# Patient Record
Sex: Female | Born: 1935 | Race: Black or African American | Hispanic: No | Smoking: Never smoker
Health system: Southern US, Community
[De-identification: ages and names within clinical notes are randomized; demographics above are authoritative.]

## PROBLEM LIST (undated history)

## (undated) DIAGNOSIS — M542 Cervicalgia: Secondary | ICD-10-CM

## (undated) DIAGNOSIS — G8252 Quadriplegia, C1-C4 incomplete: Secondary | ICD-10-CM

## (undated) DIAGNOSIS — M109 Gout, unspecified: Secondary | ICD-10-CM

## (undated) DIAGNOSIS — D649 Anemia, unspecified: Secondary | ICD-10-CM

## (undated) DIAGNOSIS — N3000 Acute cystitis without hematuria: Secondary | ICD-10-CM

## (undated) DIAGNOSIS — F329 Major depressive disorder, single episode, unspecified: Secondary | ICD-10-CM

## (undated) DIAGNOSIS — N184 Chronic kidney disease, stage 4 (severe): Secondary | ICD-10-CM

## (undated) DIAGNOSIS — F32A Depression, unspecified: Secondary | ICD-10-CM

## (undated) DIAGNOSIS — F419 Anxiety disorder, unspecified: Secondary | ICD-10-CM

## (undated) DIAGNOSIS — E1142 Type 2 diabetes mellitus with diabetic polyneuropathy: Secondary | ICD-10-CM

## (undated) DIAGNOSIS — R339 Retention of urine, unspecified: Secondary | ICD-10-CM

## (undated) DIAGNOSIS — I10 Essential (primary) hypertension: Secondary | ICD-10-CM

## (undated) DIAGNOSIS — G2581 Restless legs syndrome: Secondary | ICD-10-CM

## (undated) DIAGNOSIS — K219 Gastro-esophageal reflux disease without esophagitis: Secondary | ICD-10-CM

## (undated) DIAGNOSIS — G629 Polyneuropathy, unspecified: Secondary | ICD-10-CM

## (undated) DIAGNOSIS — E119 Type 2 diabetes mellitus without complications: Secondary | ICD-10-CM

## (undated) DIAGNOSIS — S14124D Central cord syndrome at C4 level of cervical spinal cord, subsequent encounter: Secondary | ICD-10-CM

## (undated) DIAGNOSIS — R569 Unspecified convulsions: Secondary | ICD-10-CM

## (undated) DIAGNOSIS — N189 Chronic kidney disease, unspecified: Secondary | ICD-10-CM

## (undated) DIAGNOSIS — R627 Adult failure to thrive: Secondary | ICD-10-CM

## (undated) DIAGNOSIS — I700 Atherosclerosis of aorta: Secondary | ICD-10-CM

## (undated) DIAGNOSIS — R1310 Dysphagia, unspecified: Secondary | ICD-10-CM

## (undated) DIAGNOSIS — M5410 Radiculopathy, site unspecified: Secondary | ICD-10-CM

## (undated) DIAGNOSIS — E7800 Pure hypercholesterolemia, unspecified: Secondary | ICD-10-CM

## (undated) DIAGNOSIS — E871 Hypo-osmolality and hyponatremia: Secondary | ICD-10-CM

## (undated) HISTORY — DX: Anemia, unspecified: D64.9

## (undated) HISTORY — DX: Adult failure to thrive: R62.7

## (undated) HISTORY — PX: BACK SURGERY: SHX140

## (undated) HISTORY — DX: Pure hypercholesterolemia, unspecified: E78.00

## (undated) HISTORY — DX: Gastro-esophageal reflux disease without esophagitis: K21.9

## (undated) HISTORY — PX: KNEE SURGERY: SHX244

## (undated) HISTORY — DX: Hypo-osmolality and hyponatremia: E87.1

## (undated) HISTORY — DX: Essential (primary) hypertension: I10

## (undated) HISTORY — DX: Retention of urine, unspecified: R33.9

## (undated) HISTORY — DX: Chronic kidney disease, stage 4 (severe): N18.4

## (undated) HISTORY — DX: Quadriplegia, C1-C4 incomplete: G82.52

## (undated) HISTORY — PX: CERVICAL DISC SURGERY: SHX588

## (undated) HISTORY — DX: Type 2 diabetes mellitus with diabetic polyneuropathy: E11.42

## (undated) HISTORY — PX: APPENDECTOMY: SHX54

## (undated) HISTORY — DX: Chronic kidney disease, unspecified: N18.9

## (undated) HISTORY — PX: ABDOMINAL HYSTERECTOMY: SHX81

## (undated) HISTORY — DX: Acute cystitis without hematuria: N30.00

## (undated) HISTORY — DX: Unspecified convulsions: R56.9

## (undated) HISTORY — DX: Depression, unspecified: F32.A

## (undated) HISTORY — DX: Type 2 diabetes mellitus without complications: E11.9

## (undated) HISTORY — PX: HAND SURGERY: SHX662

---

## 1999-03-03 ENCOUNTER — Encounter: Payer: Self-pay | Admitting: Neurosurgery

## 1999-03-03 ENCOUNTER — Ambulatory Visit (HOSPITAL_COMMUNITY): Admission: RE | Admit: 1999-03-03 | Discharge: 1999-03-03 | Payer: Self-pay | Admitting: Neurosurgery

## 1999-06-28 ENCOUNTER — Ambulatory Visit (HOSPITAL_COMMUNITY): Admission: RE | Admit: 1999-06-28 | Discharge: 1999-06-28 | Payer: Self-pay | Admitting: Neurosurgery

## 1999-06-28 ENCOUNTER — Encounter: Payer: Self-pay | Admitting: Neurosurgery

## 1999-07-10 ENCOUNTER — Encounter: Payer: Self-pay | Admitting: Neurosurgery

## 1999-07-10 ENCOUNTER — Ambulatory Visit (HOSPITAL_COMMUNITY): Admission: RE | Admit: 1999-07-10 | Discharge: 1999-07-10 | Payer: Self-pay | Admitting: Neurosurgery

## 1999-10-17 ENCOUNTER — Ambulatory Visit (HOSPITAL_COMMUNITY): Admission: RE | Admit: 1999-10-17 | Discharge: 1999-10-17 | Payer: Self-pay | Admitting: Neurosurgery

## 1999-10-17 ENCOUNTER — Encounter: Payer: Self-pay | Admitting: Neurosurgery

## 1999-11-07 ENCOUNTER — Ambulatory Visit (HOSPITAL_COMMUNITY): Admission: RE | Admit: 1999-11-07 | Discharge: 1999-11-07 | Payer: Self-pay | Admitting: Neurosurgery

## 1999-11-07 ENCOUNTER — Encounter: Payer: Self-pay | Admitting: Neurosurgery

## 2000-05-04 ENCOUNTER — Encounter: Payer: Self-pay | Admitting: Neurosurgery

## 2000-05-04 ENCOUNTER — Ambulatory Visit (HOSPITAL_COMMUNITY): Admission: RE | Admit: 2000-05-04 | Discharge: 2000-05-04 | Payer: Self-pay | Admitting: Neurosurgery

## 2000-05-24 ENCOUNTER — Ambulatory Visit (HOSPITAL_COMMUNITY): Admission: RE | Admit: 2000-05-24 | Discharge: 2000-05-24 | Payer: Self-pay | Admitting: Neurosurgery

## 2000-05-24 ENCOUNTER — Encounter: Payer: Self-pay | Admitting: Neurosurgery

## 2000-06-09 ENCOUNTER — Ambulatory Visit (HOSPITAL_COMMUNITY): Admission: RE | Admit: 2000-06-09 | Discharge: 2000-06-09 | Payer: Self-pay | Admitting: Neurosurgery

## 2000-06-09 ENCOUNTER — Encounter: Payer: Self-pay | Admitting: Neurosurgery

## 2000-06-21 ENCOUNTER — Emergency Department (HOSPITAL_COMMUNITY): Admission: EM | Admit: 2000-06-21 | Discharge: 2000-06-22 | Payer: Self-pay | Admitting: Emergency Medicine

## 2000-11-08 ENCOUNTER — Encounter: Payer: Self-pay | Admitting: Neurosurgery

## 2000-11-08 ENCOUNTER — Ambulatory Visit (HOSPITAL_COMMUNITY): Admission: RE | Admit: 2000-11-08 | Discharge: 2000-11-08 | Payer: Self-pay | Admitting: Neurosurgery

## 2000-11-22 ENCOUNTER — Encounter: Payer: Self-pay | Admitting: Neurosurgery

## 2000-11-24 ENCOUNTER — Inpatient Hospital Stay (HOSPITAL_COMMUNITY): Admission: RE | Admit: 2000-11-24 | Discharge: 2000-11-25 | Payer: Self-pay | Admitting: Neurosurgery

## 2000-11-24 ENCOUNTER — Encounter: Payer: Self-pay | Admitting: Neurosurgery

## 2000-12-13 ENCOUNTER — Encounter: Payer: Self-pay | Admitting: Neurosurgery

## 2000-12-13 ENCOUNTER — Encounter: Admission: RE | Admit: 2000-12-13 | Discharge: 2000-12-13 | Payer: Self-pay | Admitting: Neurosurgery

## 2001-01-18 ENCOUNTER — Encounter: Admission: RE | Admit: 2001-01-18 | Discharge: 2001-01-18 | Payer: Self-pay | Admitting: Neurosurgery

## 2001-01-18 ENCOUNTER — Encounter: Payer: Self-pay | Admitting: Neurosurgery

## 2001-07-05 ENCOUNTER — Encounter: Payer: Self-pay | Admitting: Emergency Medicine

## 2001-07-05 ENCOUNTER — Emergency Department (HOSPITAL_COMMUNITY): Admission: EM | Admit: 2001-07-05 | Discharge: 2001-07-05 | Payer: Self-pay | Admitting: Emergency Medicine

## 2004-02-13 ENCOUNTER — Ambulatory Visit (HOSPITAL_COMMUNITY): Admission: RE | Admit: 2004-02-13 | Discharge: 2004-02-13 | Payer: Self-pay | Admitting: Neurosurgery

## 2004-03-11 ENCOUNTER — Encounter: Admission: RE | Admit: 2004-03-11 | Discharge: 2004-03-11 | Payer: Self-pay | Admitting: Neurosurgery

## 2004-04-04 ENCOUNTER — Emergency Department (HOSPITAL_COMMUNITY): Admission: EM | Admit: 2004-04-04 | Discharge: 2004-04-04 | Payer: Self-pay | Admitting: Emergency Medicine

## 2004-04-20 ENCOUNTER — Ambulatory Visit (HOSPITAL_COMMUNITY): Admission: RE | Admit: 2004-04-20 | Discharge: 2004-04-20 | Payer: Self-pay | Admitting: Neurosurgery

## 2004-04-23 ENCOUNTER — Inpatient Hospital Stay (HOSPITAL_COMMUNITY): Admission: RE | Admit: 2004-04-23 | Discharge: 2004-04-26 | Payer: Self-pay | Admitting: Neurosurgery

## 2007-05-22 ENCOUNTER — Emergency Department (HOSPITAL_COMMUNITY): Admission: EM | Admit: 2007-05-22 | Discharge: 2007-05-22 | Payer: Self-pay | Admitting: Emergency Medicine

## 2007-05-27 ENCOUNTER — Ambulatory Visit (HOSPITAL_COMMUNITY): Admission: RE | Admit: 2007-05-27 | Discharge: 2007-05-27 | Payer: Self-pay | Admitting: Internal Medicine

## 2008-01-10 ENCOUNTER — Encounter (INDEPENDENT_AMBULATORY_CARE_PROVIDER_SITE_OTHER): Payer: Self-pay | Admitting: Emergency Medicine

## 2008-01-10 ENCOUNTER — Inpatient Hospital Stay (HOSPITAL_COMMUNITY): Admission: EM | Admit: 2008-01-10 | Discharge: 2008-01-15 | Payer: Self-pay | Admitting: Emergency Medicine

## 2008-01-10 ENCOUNTER — Ambulatory Visit: Payer: Self-pay | Admitting: Vascular Surgery

## 2010-09-21 ENCOUNTER — Encounter: Payer: Self-pay | Admitting: Neurosurgery

## 2010-09-22 ENCOUNTER — Encounter: Payer: Self-pay | Admitting: Internal Medicine

## 2011-01-13 NOTE — H&P (Signed)
[name], [name]               ACCOUNT NO.:  [id number]   MEDICAL RECORD NO.:  [id number]          PATIENT TYPE:  EMS   LOCATION:  MAJO                         FACILITY:  [facility]   PHYSICIAN:  Aquilla Hacker, M.D. DATE OF BIRTH:  Dec 20, 1935   DATE OF ADMISSION:  01/09/2008  DATE OF DISCHARGE:                              HISTORY & PHYSICAL   PRIMARY CARE PHYSICIAN:  Unassigned.   NEUROSURGEON:  Dr. Meryle Ready.   CHIEF COMPLAINT:  Back and leg pain.   HISTORY OF PRESENT ILLNESS:  [name] 75 year old female with a  past medical history of diabetes mellitus and chronic low back pain.  She indicates that she has had back pain off and on for quite some time,  but had relatively little back pain up until Sunday which is 3 days ago.  During that time she had an episode whereby her left leg began to hurt  as well as the left lower region of her back.  She states that it felt  like the back pain was traveling down to her left leg.  She became  nauseated and felt as if she is going to pass out.  She is very vague  with regards to how long this episode lasted, but indicates that she had  a second episode today.  She was sitting up completing her check when  she began to feel what she refers to as a swimming type of sensation in  her head.  She became lightheaded and she began to stagger as if she was  drunk, and then she again had an episode of back pain.  This time the  pain traveled down both of her legs, but more so on the left-side than  the right.  She indicates that last week she fell in the bath tub on  Tuesday or Wednesday.  She had no immediate.  Today her sugars were  elevated and she experienced at least three episodes of vomiting today.  When asked whether or not she had a seizure, she stated that she may  have had a seizure episode today.   PAST MEDICAL HISTORY:  1. Diabetes mellitus.  2. Low blood pressure.  3. Chronic back pain.  4. Chronic low leg pain since  July 2008.  5. History of seizure in the past.   PAST SURGICAL HISTORY:  Back surgery x2.   ALLERGIES:  CODEINE.   HOME MEDICATIONS:  1. Metformin 500 mg b.i.d.  2. Poor compliance with other home medications.   SOCIAL HISTORY:  Cigarettes:  The patient denies.  Alcohol:  The patient  denies.   FAMILY HISTORY:  Mother had a history of colon cancer.  Father had heart  problems and gout.   REVIEW OF SYSTEMS:  As per HPI.   PHYSICAL EXAMINATION:  GENERAL:  The patient is wide awake.  She looks  uncomfortable and she prefers to lay on the left side of her  back/axillary region, indicating that this is more comfortable for her.  VITAL SIGNS:  Her temperature was 101.4, blood pressure 104/58, heart  rate 110, respirations 12, oxygen saturation  is 99%. HEENT:  Normocephalic, atraumatic.  Anicteric extraocular muscles intact.  Oral  mucosa is pink.  No thrush, no exudates.  NECK:  Supple.  No lymphadenopathy, no thyromegaly.  CARDIAC:  S1 and S2  present.  Heart sounds are distant.  RESPIRATORY:  No crackles or  wheezes.  ABDOMEN:  Soft, nontender, nondistended.  Positive bowel sounds.  No  masses.  EXTREMITIES:  No leg edema.  NEUROLOGIC:  The patient is alert and oriented to person, place.  She  knows the month is May.  She thought the year was 2008.  MUSCULOSKELETAL:  5/5 upper extremity strength.  Right leg strength is  5/5.  Left leg had a significantly decreased range of motion secondary  to back pain that occurred as soon as the patient attempted to raise her  leg.   LABORATORY DATA:  Urinalysis is negative.  Sodium 136, potassium 3.6,  chloride 103, CO2 27, glucose 300, BUN 11, creatinine 0.89, calcium  8.5.  WBCs of 12.6, hemoglobin 11.1, hematocrit 32.4, platelet 184,000.  X-ray of the lumbar spine: Osteopenia, mild spondylosis.  No acute  findings.  Chest x-ray reveals no acute cardiopulmonary abnormalities.   ASSESSMENT/PLAN:  1. Acute on chronic low back pain.  The  precipitating factor for the      patient's current low back pain is questionable.  An MRI of the      patient's back has been ordered.  Will follow the results of that.      Will also provide the patient with p.r.n. pain medication.  Will      consider consultation with the patient's neurosurgeon.  2. Bilateral leg pain with left being greater than the right.  There      may be some sort of a radiculopathy associated with the patient's      lower back pain.  Again will get the MRI of the patient's back and      consider further work-up pending the results of the patient's back.      We will also order venous Dopplers of both of the patient's lower      extremities.  3. Diabetes mellitus.  Per patient report, her sugars have been      elevated.  Will check a hemoglobin A1C and start Accu-Cheks as well      as sliding scale insulin.  Will adjust the patient's medications as      needed to optimally control her diabetes in the course of this      hospitalization.  4. For GI prophylaxis, provide Protonix.  5. For DVT prophylaxis, provide Lovenox      Aquilla Hacker, M.D.  Electronically Signed     OR/MEDQ  D:  01/10/2008  T:  01/10/2008  Job:  [id number]

## 2011-01-13 NOTE — Consult Note (Signed)
NAMESHIYAN, EMPEY               ACCOUNT NO.:  [id number]   MEDICAL RECORD NO.:  [id number]          PATIENT TYPE:  INP   LOCATION:  6733                         FACILITY:  [facility]   PHYSICIAN:  Ashok Pall, M.D.     DATE OF BIRTH:  26-Aug-1936   DATE OF CONSULTATION:  DATE OF DISCHARGE:                                 CONSULTATION   REASON FOR CALL:  Findings on MRI scan secondary to the patient being  admitted for fever.   INDICATIONS:  [name] is a 34-year woman very well known to me, as  I am taking care of her now for a number of years.  She was admitted  recently secondary to fever, chills, nausea, and edema in her right  lower extremity.  She has been having some left lower extremity pain and  back pain for some time, but this did not get much worse when she was  admitted.  She was admitted to the hospital on Jan 09, 2008.  She began  to stagger, like someone who was drunk, and her daughter felt that she  should come to the hospital.  She fell on the bathtub on Tuesday and  Wednesday of last week, had no immediate pain at that time.  She had  three episodes of emesis on the day of admission and had bilateral low  back pain.   [name] underwent her lumbar procedure that I performed in 2005 in  the lower back.  I also did an anterior cervical decompression and  arthrodesis at C4-C5 and C5-C6 in 2002.  After a lumbar procedure for  left-sided disk herniation at L5-S1, [name] improved.  Obviously,  I have not seen her back in the office for about half a year to a year.  She had been doing well enough that she has not called in for pain  medications at least to my office, nor any other problems.  She has been  having this low back pain now for some time.  She had another scan,  which was done in October 2008.  That scan was read as showing  postoperative changes.  There was no residual disk protrusion at that  time, and I am called today because it was felt by  the medical doctor  from [facility] to see Dr. Ivette Loyal that she might have a recurrent disk,  which was causing her pain.   PAST MEDICAL HISTORY:  Significant for chronic back pain, leg pain since  July 2008.  Diabetes mellitus, low blood pressure, history of seizure in  the past.  She has had two lumbar procedures.  She has had one cervical  procedure.   ALLERGIES:  To CODEINE.   Medications upon admission to the hospital were metformin 500 mg p.o.  b.i.d.   CURRENT MEDICATIONS:  1. Protonix.  2. Lovenox.  3. Senokot.  4. NovoLog.  5. Insulin on a sliding scale.  6. Morphine for pain.  7. Zofran p.r.n. suppository.   She says she has been constipated also.  She has also been noted to be  febrile, she is  febrile to 101.4 on admission.   FAMILY HISTORY:  Mother had colon cancer.  Father had heart disease and  gout.   VITAL SIGNS:  Today, T-max of 98.1, pulse 71, respiratory rate 20, blood  pressure 134/79.  Blood sugars have been 130s to 160s.  On examination, she is alert, oriented x4, and answering all questions  appropriately.  She is lying on the bed and appears comfortable.  She has some minimal weakness in the left extensor hallucis longus and  left dorsiflexor to about 5-/5, otherwise 5/5 strength in the lower  extremities.  She has erythema and edema present in the right leg, which  is tender to touch.  Normal muscle tone, bulk, and coordination.  Sensation to light touch is  intact, proprioception is intact.  She has 5/5 strength in the upper  extremities.  Normal coordination.  Speech is clear and fluent.  Memory  length, assessment, and fund of knowledge are normal.  She has symmetric  facial sensation.  Hearing intact to voice bilaterally.  Tongue and  uvula in the midline.  Shoulder shrug is normal.   MRI was reviewed.  She does not have a recurrent disk.  There is  absolutely no change in the scan from the one that was done in  October  2008.  Certainly, no  indication whatsoever for operative intervention.  She has granulation tissue surrounding the nerve root.  Thecal sac has  been displaced since she had the disk herniation, but she has absolutely  no evidence of the disk herniation now.  She certainly has no acute  entity, which I can identify in the lumbar spine, which would be causing  the pain and discomfort that she has.  But that certainly is not causing  her to be febrile, or either causing her to be nauseated or cause her to  have emesis.  I do not believe that she needs any acute intervention.  For the leg pain, we can certainly arrange for her to undergo epidural  steroid injections after she is discharged from the hospital, and full  medical workup has been completed.  But without any evidence of  diskitis, osteomyelitis, recurrent disk herniation of the leg,  neurosurgical involvement is limited during this hospitalization.           ______________________________  Ashok Pall, M.D.     KC/MEDQ  D:  01/11/2008  T:  01/12/2008  Job:  [id number]

## 2011-01-13 NOTE — Discharge Summary (Signed)
[name], [name]               ACCOUNT NO.:  [id number]   MEDICAL RECORD NO.:  [id number]          PATIENT TYPE:  INP   LOCATION:  6733                         FACILITY:  [facility]   PHYSICIAN:  Sherryl Manges, M.D.  DATE OF BIRTH:  [birth year]   DATE OF ADMISSION:  01/09/2008  DATE OF DISCHARGE:  01/15/2008                               DISCHARGE SUMMARY   PRIMARY MEDICAL DOCTOR:  Dr. Elba Barman, [facility]; telephone  number [phone].  The patient is unassigned to [place].   NEUROSURGEON:  Dr. Ashok Pall.   DISCHARGE DIAGNOSES:  1. 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  2. Type 2 diabetes mellitus.  3. Right lower extremity cellulitis.  4. Tinea interdigitalis right foot.  5. History of seizure disorder.   DISCHARGE MEDICATIONS:  1. Flexeril 10 mg p.o. t.i.d.  2. Motrin 600 mg p.o. t.i.d. with food.  3. Vicodin (5/500) one p.o. p.r.n. q.6h.  4. Lotrimin 1% AF apply topically between toes right foot twice daily      for 2 weeks.  5. Metformin 500 mg p.o. b.i.d.  6. Augmentin 500 mg p.o. t.i.d. for week only.   PROCEDURES:  1. Chest x-ray dated Jan 09, 2008.  This showed no acute      cardiopulmonary abnormality.  2. X-ray lumbar spine dated Jan 09, 2008.  This showed osteopenia,      mild spondylosis, no acute findings, degenerative disk disease      noted at L5-S1.  3. MRI lumbar spine dated Jan 10, 2008.  This showed no evidence of      acute infection in the lumbar spine. stable to mildly progressed      advanced disk degeneration at L5-S1 with postoperative changes at      this level.  Stable assessment of left S1 nerve root and left L5      neural foraminal stenosis.  Slightly decreased annular tier at L4-      5.  Stable advanced multilevel facet degeneration.  Stable 10-mm      posterior cystic structures probably related to the left L5-S1      facets.   CONSULTATIONS:  Dr. Ashok Pall, neurosurgeon.   ADMISSION HISTORY:  As in  H&P notes of Jan 09, 2008, dictated by Dr.  Aquilla Hacker.  However, in brief, this is a 75 year old female, with  known history of type 2 diabetes mellitus, chronic low back pain status  post previous surgery, remote history of seizure disorder, who presents  with exacerbation of chronic low back pain.  She was admitted further  evaluation, investigation and management.   CLINICAL COURSE:  1. Lumbosacral DJD/radiculopathy.  The patient presents with acute-on-      chronic low back pain, with no history of antecedent trauma.      Imaging studies demonstrated DJD of the lumbosacral spine.  She was      managed with p.r.n. analgesics.  Neurosurgical consultation was      kindly provided by Dr. Christella Noa.  For details of his consultation,      refer to consultation notes of Jan 11, 2008.  In brief, he opined      that the patient does not require invasive management at the      present time, although she may require steroid injections following      discharge from hospitalization.  He recommended adding NSAIDS to      current management and continue with physiotherapy.  The patient      responded to physiotherapy as well as NSAID treatment and by Jan 15, 2008, was practically asymptomatic from this viewpoint.   1. Type 2 diabetes mellitus.  This was controlled with a combination      of carbohydrate-modified diet, pre-admission oral hypoglycemics,      and sliding-scale insulin coverage.  The patient remained      euglycemic throughout the course of her hospitalization.   1. Right lower extremity cellulitis.  The patient on Jan 11, 2008,      complained of a lump in her right groin as well as swelling,      redness and soreness of the affected lower extremity.  Physical      examination demonstrated regional inguinal lymphadenopathy as well      as cellulitis affecting the right lower extremity.  Blood cultures      were sent off.  No growth was ever obtained.  However, she was       commenced on a combination of Vancomycin and Zosyn, with dramatic      clinical response.  By Jan 14, 2008, local inflammatory phenomena      had practically subsided.  The patient was therefore transitioned      to oral Augmentin to complete a further 7-day course of treatment.   1. Tinea interdigitalis.  This patient does have tinea interdigitalis      of her right foot.  This is likely the portal of entry for right      lower extremity cellulitis.  She has been placed on a course of      topical Lotrimin AF.   1. History of seizure disorder.  The patient has not been on      anticonvulsant medications for several years, according to her, and      has had no seizures in that period of time.  Certainly, during the      course of this hospitalization, no seizures were documented.   DISPOSITION:  The patient was on Jan 15, 2008, asymptomatic and  considered clinically stable for discharge.  As a matter of fact, she  was very keen to go home.  She was therefore discharged accordingly.   DIET:  Heart-healthy, carbohydrate modified.   ACTIVITY:  As tolerated.   FOLLOW-UP INSTRUCTIONS:  The patient is to return to her primary MD, Dr.  Seward Speck, within 1 week of discharge, telephone number [phone].  The  patient has been instructed to call for an appointment.  She is also to  follow up with her primary neurosurgeon, Dr. Christella Noa.  The patient has  been instructed to call for an appointment.      Sherryl Manges, M.D.  Electronically Signed     CO/MEDQ  D:  01/15/2008  T:  01/15/2008  Job:  [id number]   cc:   Everardo Pacific, M.D.

## 2011-01-16 NOTE — Discharge Summary (Signed)
[name], [name]               ACCOUNT NO.:  [id number]   MEDICAL RECORD NO.:  [id number]          PATIENT TYPE:  INP   LOCATION:  [id number]                         FACILITY:  [facility]   PHYSICIAN:  Leeroy Cha, M.D.   DATE OF BIRTH:  1936/02/11   DATE OF ADMISSION:  04/26/2004  DATE OF DISCHARGE:  04/26/2004                                 DISCHARGE SUMMARY   ADMISSION DIAGNOSIS:  Right L5-S1 recurrent herniated disk.   FINAL DIAGNOSIS:  Right L5-S1 recurrent herniated disk.   The patient was admitted by Dr. Christella Noa because of back and left leg pain.  Previously, the patient had surgery at the level of L5-S1.  X-rays show  recurrent herniated disk.  The patient was admitted for surgery by Dr.  Christella Noa.   LABORATORY:  Normal.   HOSPITAL COURSE:  The patient was taken to surgery on April 24, 2004 and a  left L5-S1 diskectomy was performed.  A little bit more than 50 hours after  she was admitted to the hospital she was discharged feeling much better.   CONDITION ON DISCHARGE:  Improved.   DISCHARGE MEDICATIONS:  1.  Percocet.  2.  Flexeril.  3.  Neurontin.   DIET:  Diabetic diet.   FOLLOW UP:  She is going to be seen by Dr. Christella Noa in 4 weeks.       EB/MEDQ  D:  09/03/2004  T:  09/03/2004  Job:  [id number]   cc:   Ashok Pall, M.D.  [address]  [place]  [place] [address]  Fax: [phone]

## 2011-01-16 NOTE — Op Note (Signed)
[facility]  Patient:    [name], [name]                      MRN: [id number] Proc. Date: 11/24/00 Adm. Date:  [id number] Disc. Date: [id number] Attending:  Ashok Pall                           Operative Report  PREOPERATIVE DIAGNOSES: 1. Displaced disk C4-5 left. 2. 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 3. Degenerative disk disease C5-6. 4. Left C5 radiculopathy. 5. Left C6 radiculopathy.  POSTOPERATIVE DIAGNOSES: 1. Displaced disk C4-5 left. 2. 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 3. Degenerative disk disease C5-6. 4. Left C5 radiculopathy. 5. Left C6 radiculopathy.  PROCEDURE: 1. Anterior cervical diskectomy C4-5. 2. Anterior cervical diskectomy C5-6. 3. Arthrodesis C4 to C6 with anterior plating Synthes and structural    allograft.  COMPLICATIONS:  None.  ANESTHESIA:  General endotracheal anesthesia.  INDICATIONS:  [name] is a 75 year old woman who presents with a history of left arm pain, left deltoid weakness, a displaced disk at C4-5, and degenerative changes at C5-6 causing foraminal stenosis.  I have recommended in the past and she has agreed to undergo an anterior cervical diskectomy at C4-5 and at C5-6, for decompression of the nerve roots and relief of pain.  DESCRIPTION OF PROCEDURE:  [name] was brought to the operating room, intubated, placed under general anesthesia without difficulty.  I have her placed in slight extension with 10 pounds of cervical traction applied via via chin strap.  She was prepped and then draped in a sterile fashion.  I made my incision, starting at the midline and taking it to the medial border of the left sternocleidomastoid muscle.  I took this down through the subcutaneous fat to the layer of the platysma.  I used bipolar cautery to control bleeding in the subcutaneous fat and subcuticular areas.  I used Metzenbaum scissors to dissect both above and below the platysma  rostrally and caudally to increase my working space.  I then was able to identify the medial border of the left sternocleidomastoid.  I retracted the medial structures medially and exposed the anterior cervical spine.  I used the kitner to remove soft tissue from anterior cervical spine.  I placed the needle and it was hard to see but I believed it to be C5-6 but I removed that needle and placed it again 1 disk space higher and took another x-ray and this showed that I was at C4-5 on the second needle placement to try to correctly identify the area of my operation.  I then reflected the longus coli muscles laterally.  Placed the distraction pins 1 at C5, and 1 at C6 and a self-retaining ______ retractor.  I opened the disk space with a #15 blade and then using curets and pituitary rongeurs removed more disk material.  I brought the microscope into the operative field and used that to aid in freeing the disk space and for using the drill.  I was able to drill away some osteophytes and expose the posterior longitudinal ligament.  I got underneath that with a 1 Kerrison thin lipped punch.  Using that to create an opening I then created a large opening.  I then used a 2 Kerrison punch and was able to start good decompression of the nerve roots bilaterally.  I again, used a drill, and I used a 3 Kerrison punch  in addition to removing more disk and spondylitic bars posteriorly at C4-5.  I was able to go out over the nerve roots bilaterally, and palpate, and felt that there was no further disk material.  There was a significant amount of disk between the layers of the posterior longitudinal ligament which were obviously causing a problem.  After adequate decompression I then placed some Gelfoam and removed the distraction pin at C4 and placed it at C6.  I used the #15 blade and moved the retractors down to expose the space where I used a #15 blade to open the disk space at C5-6, again using curets  and pituitary rongeurs I removed disk material.  I then used the drill and the microscope to remove spondylitic bars and osteophytes posteriorly.  There large osteophyte on the right side coming off the body of C6.  After removing the bony spurs I was able to get underneath the posterior longitudinal ligament with a Kerrison punch.  I then was able to decompress the nerve roots bilaterally, those being C6 using a Kerrison punch and a drill.  Then after adequate decompression I irrigated the wound.  I then used a cylindrical bur and evened out the vertebral end plates at C5 and C6 and then I placed a 7 mm graft.  I then placed another 7 mm graft at C4-5 without difficulty.  I then used Synthes plating system first using a awl and putting a screw at C5 to center the plate and keep it in position.  I then placed the other screws, all 13 mm, one resting screw at the top right at C4, the two screws in C4, 2 in C5, and 2 in C6.  I placed locking screws.  Took another x-ray which showed the plate and plugs to be in good position.   I then irrigated the wound.  I then closed the wound in a layered fashion reapproximating the layer of platysma and then the subcuticular layer. Dermabond was used for a dressing.  The patient was extubated.  The patient had the weight removed during the plate placement.  The patient tolerated the procedure well and was moving all extremities postoperatively. DD:  11/24/00 TD:  11/25/00 Job: [id number]

## 2011-01-16 NOTE — Op Note (Signed)
NAME:  [name], [name]                         ACCOUNT NO.:  [id number]   MEDICAL RECORD NO.:  [id number]                   PATIENT TYPE:  INP   LOCATION:  3014                                 FACILITY:  [facility]   PHYSICIAN:  Ashok Pall, M.D.                  DATE OF BIRTH:  [birth year]   DATE OF PROCEDURE:  04/23/2004  DATE OF DISCHARGE:                                 OPERATIVE REPORT   PREOPERATIVE DIAGNOSES:  1. Recurrent displaced disk, left L5-S1.  2. Left S1 radiculopathy.   POSTOPERATIVE DIAGNOSIS:  1. Recurrent displaced disk, left L5-S1.  2. Left S1 radiculopathy.   OPERATION PERFORMED:  Left L5-S1 redo lumbar laminectomy and diskectomy with  microdissection.   SURGEON:  Ashok Pall, M.D.   ASSISTANT:  Hosie Spangle, M.D.   ANESTHESIA:  General.   INDICATIONS FOR PROCEDURE:  [name] is a 75 year old woman who  presented in June with severe pain in the left lower extremity.  MRI showed  a herniated disk at L5-S1 which was recurrent.  She decided to cancel the  surgery which had been scheduled July 11 and opted for epidural  steroid  injections.  She  underwent the injections but had worsened pain.  A repeat  MRI study was performed and it showed larger disk recurrence at L5-S1.  Secondary to that I recommended again that she undergo surgery and she  agreed.   DESCRIPTION OF PROCEDURE:  [name] was brought to the operating room  intubated and placed under general anesthetic without difficulty.  Using an  old incision as a guide, I opened that with a #10 blade and took this down  to the thoracolumbar fascia.  I then exposed the lamina of L5 and S1.  I  took an intraoperative x-ray to show that I was in the correct interlaminar  space and I was.  I then proceeded with a semilaminectomy using Kerrison  punches of the L5 lamina.  I then removed a significant amount of scar  tissue and also removed some bone in semihemilaminectomy from S1.  I was  able to  expose the thecal sac along with some scar and retract that  medially. I  was able to enter the disk space at that point and removed a  significant amount of disk material with microscopic dissection and Dr.  Donnella Bi assistance.  We were able to fully decompress the disk space in  terms of loose material that I was able to appreciate.  The nerve roots I  then felt were free of compression on the left side, both the L5 neural  foramen and left S1 nerve root were decompressed.  I then irrigated the  wound.  I then closed the wound in layered fashion using Vicryl sutures to  reapproximate the thoracolumbar fascia, subcutaneous tissues and the skin  edges.  Dermabond used for sterile dressing.  Ashok Pall, M.D.    KC/MEDQ  D:  04/23/2004  T:  04/24/2004  Job:  [id number]

## 2011-01-16 NOTE — H&P (Signed)
[place]  Patient:    IYANA, TURI                     MRN: [id number] Adm. Date:  11/24/00 Attending:  Marylyn Ishihara L. Christella Noa, M.D.                         History and Physical  ADMITTED DIAGNOSES:  1. Displaced disk, C4-5, left side.  2. Left C5 radiculopathy.  3. Cervical spondylosis without myelopathy at C5-6, C4-5.  HISTORY OF PRESENT ILLNESS: Derna Dittmer is a patient whom I have followed now for a number of years.  She initially presented in June 2000 for neck and right shoulder pain.  An MRI done at that time showed severe stenosis at C4-5 secondary to spondylitic disease and disk herniation which is greater to the left than right, foraminal stenosis bilaterally.  She also had spondylosis at C5-6 with foraminal stenosis again on the right and left side.  At that time I actually recommended that Ms. Hockenbury undergo an operation at C4-5 and C5-6. She did agree but then eventually backed out because frankly she was scared of having an operation.  She did well with that and started to have some problems with her lower back.  Those did resolve with epidural injections.  Then she came back to see me on November 01, 2000 because she was having pain now in the neck and left upper extremity.  She had had the pain for about two weeks prior to seeing me.  It was quite severe.  It has only gotten worse since the initial visit in March 2002.  She has had no change in strength, coordination, bowel or bladder function.  PAST MEDICAL HISTORY:  1. Asthma.  2. Gastroesophageal reflux.  3. Diabetes.  4. Seizure disorder.  5. History of peptic ulcer disease.  CURRENT MEDICATIONS:  1. Dilantin.  2. Glucophage.  3. Prilosec.  4. Estradiol.  5. Ultram.  6. Pravachol.  7. Albuterol.  8. Flovent.  9. Nasonex.  ALLERGIES: CODEINE.  PHYSICAL EXAMINATION:  VITAL SIGNS: Pulse 79, blood pressure 142/82, temperature 97 degrees.  NEUROLOGIC: Ms. Vanbramer  is alert and oriented x 4, answering all questions appropriately.  She is well kempt and in no distress.  Fund of knowledge is normal.  Memory, language, and attention span are normal.  Ms. Contreraz on examination has good strength in the upper extremities, 5/5.  Muscle tone, coordination, and bulk are normal.  Symmetric smile.  Tongue and uvula are midline.  Deep tendon reflexes are 2+ at the biceps, triceps, brachial radialis, knees, and ankles.  NECK: No cervical masses or bruits.  CHEST: Lung fields coarse with some mild wheezing.  EXTREMITIES: Pulses good at the wrists and feet bilaterally.  LABORATORY DATA: MRI done this year showed that she had a large herniated disk at C4-5 on the left side with foraminal narrowing bilaterally at C4-5 and at C5-6, spondylitic changes at C5-6 and degenerative disk at C5-6.  ADDENDUM: Ms. Vaccari actually did not have 5/5 strength in all extremities. She had 4/5 strength in the left deltoid.  ASSESSMENT/PLAN: The risks of the procedure including bleeding, infection, no relief, bowel or bladder dysfunction, paralysis, arm weakness, leg weakness, fusion failure, hardware failure, recurrent laryngeal nerve injury were discussed.  She understands and wishes to proceed. DD:  11/24/00 TD:  11/25/00 Job: [id number]

## 2011-05-27 LAB — VANCOMYCIN, TROUGH: Vancomycin Tr: 18.6

## 2011-05-27 LAB — BASIC METABOLIC PANEL
BUN: 14
CO2: 28
Chloride: 103
GFR calc Af Amer: 55 — ABNORMAL LOW
Glucose, Bld: 189 — ABNORMAL HIGH
Sodium: 139

## 2011-05-27 LAB — CBC: WBC: 4.6

## 2016-02-06 DIAGNOSIS — Z800 Family history of malignant neoplasm of digestive organs: Secondary | ICD-10-CM | POA: Diagnosis not present

## 2016-02-06 DIAGNOSIS — E782 Mixed hyperlipidemia: Secondary | ICD-10-CM | POA: Diagnosis not present

## 2016-02-06 DIAGNOSIS — K5730 Diverticulosis of large intestine without perforation or abscess without bleeding: Secondary | ICD-10-CM | POA: Diagnosis not present

## 2016-02-06 DIAGNOSIS — E1165 Type 2 diabetes mellitus with hyperglycemia: Secondary | ICD-10-CM | POA: Diagnosis not present

## 2016-02-06 DIAGNOSIS — H269 Unspecified cataract: Secondary | ICD-10-CM | POA: Diagnosis not present

## 2016-02-06 DIAGNOSIS — I10 Essential (primary) hypertension: Secondary | ICD-10-CM | POA: Diagnosis not present

## 2016-02-06 DIAGNOSIS — K219 Gastro-esophageal reflux disease without esophagitis: Secondary | ICD-10-CM | POA: Diagnosis not present

## 2016-02-18 DIAGNOSIS — E1169 Type 2 diabetes mellitus with other specified complication: Secondary | ICD-10-CM | POA: Diagnosis not present

## 2016-03-24 DIAGNOSIS — K219 Gastro-esophageal reflux disease without esophagitis: Secondary | ICD-10-CM | POA: Diagnosis not present

## 2016-03-24 DIAGNOSIS — E1165 Type 2 diabetes mellitus with hyperglycemia: Secondary | ICD-10-CM | POA: Diagnosis not present

## 2016-03-24 DIAGNOSIS — E782 Mixed hyperlipidemia: Secondary | ICD-10-CM | POA: Diagnosis not present

## 2016-03-24 DIAGNOSIS — I10 Essential (primary) hypertension: Secondary | ICD-10-CM | POA: Diagnosis not present

## 2016-03-26 DIAGNOSIS — I10 Essential (primary) hypertension: Secondary | ICD-10-CM | POA: Diagnosis not present

## 2016-03-26 DIAGNOSIS — E782 Mixed hyperlipidemia: Secondary | ICD-10-CM | POA: Diagnosis not present

## 2016-03-26 DIAGNOSIS — Z800 Family history of malignant neoplasm of digestive organs: Secondary | ICD-10-CM | POA: Diagnosis not present

## 2016-03-26 DIAGNOSIS — Z6831 Body mass index (BMI) 31.0-31.9, adult: Secondary | ICD-10-CM | POA: Diagnosis not present

## 2016-03-26 DIAGNOSIS — H269 Unspecified cataract: Secondary | ICD-10-CM | POA: Diagnosis not present

## 2016-03-26 DIAGNOSIS — K5730 Diverticulosis of large intestine without perforation or abscess without bleeding: Secondary | ICD-10-CM | POA: Diagnosis not present

## 2016-03-26 DIAGNOSIS — K219 Gastro-esophageal reflux disease without esophagitis: Secondary | ICD-10-CM | POA: Diagnosis not present

## 2016-03-26 DIAGNOSIS — E1165 Type 2 diabetes mellitus with hyperglycemia: Secondary | ICD-10-CM | POA: Diagnosis not present

## 2016-05-05 DIAGNOSIS — E1169 Type 2 diabetes mellitus with other specified complication: Secondary | ICD-10-CM | POA: Diagnosis not present

## 2016-07-08 DIAGNOSIS — E1169 Type 2 diabetes mellitus with other specified complication: Secondary | ICD-10-CM | POA: Diagnosis not present

## 2016-07-13 DIAGNOSIS — K219 Gastro-esophageal reflux disease without esophagitis: Secondary | ICD-10-CM | POA: Diagnosis not present

## 2016-07-13 DIAGNOSIS — I10 Essential (primary) hypertension: Secondary | ICD-10-CM | POA: Diagnosis not present

## 2016-07-13 DIAGNOSIS — Z800 Family history of malignant neoplasm of digestive organs: Secondary | ICD-10-CM | POA: Diagnosis not present

## 2016-07-13 DIAGNOSIS — E782 Mixed hyperlipidemia: Secondary | ICD-10-CM | POA: Diagnosis not present

## 2016-07-13 DIAGNOSIS — E1165 Type 2 diabetes mellitus with hyperglycemia: Secondary | ICD-10-CM | POA: Diagnosis not present

## 2016-07-13 DIAGNOSIS — K5730 Diverticulosis of large intestine without perforation or abscess without bleeding: Secondary | ICD-10-CM | POA: Diagnosis not present

## 2016-07-15 DIAGNOSIS — S161XXA Strain of muscle, fascia and tendon at neck level, initial encounter: Secondary | ICD-10-CM | POA: Diagnosis not present

## 2016-07-15 DIAGNOSIS — E1165 Type 2 diabetes mellitus with hyperglycemia: Secondary | ICD-10-CM | POA: Diagnosis not present

## 2016-07-15 DIAGNOSIS — K219 Gastro-esophageal reflux disease without esophagitis: Secondary | ICD-10-CM | POA: Diagnosis not present

## 2016-07-15 DIAGNOSIS — I10 Essential (primary) hypertension: Secondary | ICD-10-CM | POA: Diagnosis not present

## 2016-07-15 DIAGNOSIS — E782 Mixed hyperlipidemia: Secondary | ICD-10-CM | POA: Diagnosis not present

## 2016-07-15 DIAGNOSIS — Z6831 Body mass index (BMI) 31.0-31.9, adult: Secondary | ICD-10-CM | POA: Diagnosis not present

## 2016-07-21 DIAGNOSIS — Z800 Family history of malignant neoplasm of digestive organs: Secondary | ICD-10-CM | POA: Diagnosis not present

## 2016-07-21 DIAGNOSIS — I10 Essential (primary) hypertension: Secondary | ICD-10-CM | POA: Diagnosis not present

## 2016-07-21 DIAGNOSIS — K219 Gastro-esophageal reflux disease without esophagitis: Secondary | ICD-10-CM | POA: Diagnosis not present

## 2016-10-14 DIAGNOSIS — M5416 Radiculopathy, lumbar region: Secondary | ICD-10-CM | POA: Diagnosis not present

## 2016-10-14 DIAGNOSIS — Z23 Encounter for immunization: Secondary | ICD-10-CM | POA: Diagnosis not present

## 2016-10-14 DIAGNOSIS — Z6832 Body mass index (BMI) 32.0-32.9, adult: Secondary | ICD-10-CM | POA: Diagnosis not present

## 2016-10-14 DIAGNOSIS — M545 Low back pain: Secondary | ICD-10-CM | POA: Diagnosis not present

## 2016-10-14 DIAGNOSIS — E1165 Type 2 diabetes mellitus with hyperglycemia: Secondary | ICD-10-CM | POA: Diagnosis not present

## 2016-10-20 DIAGNOSIS — E782 Mixed hyperlipidemia: Secondary | ICD-10-CM | POA: Diagnosis not present

## 2016-10-20 DIAGNOSIS — I10 Essential (primary) hypertension: Secondary | ICD-10-CM | POA: Diagnosis not present

## 2016-10-20 DIAGNOSIS — E1165 Type 2 diabetes mellitus with hyperglycemia: Secondary | ICD-10-CM | POA: Diagnosis not present

## 2016-10-20 DIAGNOSIS — K219 Gastro-esophageal reflux disease without esophagitis: Secondary | ICD-10-CM | POA: Diagnosis not present

## 2016-10-23 DIAGNOSIS — Z6831 Body mass index (BMI) 31.0-31.9, adult: Secondary | ICD-10-CM | POA: Diagnosis not present

## 2016-10-23 DIAGNOSIS — I10 Essential (primary) hypertension: Secondary | ICD-10-CM | POA: Diagnosis not present

## 2016-10-23 DIAGNOSIS — K5730 Diverticulosis of large intestine without perforation or abscess without bleeding: Secondary | ICD-10-CM | POA: Diagnosis not present

## 2016-10-23 DIAGNOSIS — E782 Mixed hyperlipidemia: Secondary | ICD-10-CM | POA: Diagnosis not present

## 2016-10-23 DIAGNOSIS — K219 Gastro-esophageal reflux disease without esophagitis: Secondary | ICD-10-CM | POA: Diagnosis not present

## 2016-10-23 DIAGNOSIS — E1165 Type 2 diabetes mellitus with hyperglycemia: Secondary | ICD-10-CM | POA: Diagnosis not present

## 2016-10-23 DIAGNOSIS — M5416 Radiculopathy, lumbar region: Secondary | ICD-10-CM | POA: Diagnosis not present

## 2016-11-25 DIAGNOSIS — E1169 Type 2 diabetes mellitus with other specified complication: Secondary | ICD-10-CM | POA: Diagnosis not present

## 2016-12-25 DIAGNOSIS — M5441 Lumbago with sciatica, right side: Secondary | ICD-10-CM | POA: Diagnosis not present

## 2016-12-25 DIAGNOSIS — G8929 Other chronic pain: Secondary | ICD-10-CM | POA: Diagnosis not present

## 2017-03-01 DIAGNOSIS — E1165 Type 2 diabetes mellitus with hyperglycemia: Secondary | ICD-10-CM | POA: Diagnosis not present

## 2017-03-01 DIAGNOSIS — E782 Mixed hyperlipidemia: Secondary | ICD-10-CM | POA: Diagnosis not present

## 2017-03-01 DIAGNOSIS — I10 Essential (primary) hypertension: Secondary | ICD-10-CM | POA: Diagnosis not present

## 2017-03-01 DIAGNOSIS — K219 Gastro-esophageal reflux disease without esophagitis: Secondary | ICD-10-CM | POA: Diagnosis not present

## 2017-03-04 DIAGNOSIS — I10 Essential (primary) hypertension: Secondary | ICD-10-CM | POA: Diagnosis not present

## 2017-03-04 DIAGNOSIS — M5416 Radiculopathy, lumbar region: Secondary | ICD-10-CM | POA: Diagnosis not present

## 2017-03-04 DIAGNOSIS — K219 Gastro-esophageal reflux disease without esophagitis: Secondary | ICD-10-CM | POA: Diagnosis not present

## 2017-03-04 DIAGNOSIS — Z0001 Encounter for general adult medical examination with abnormal findings: Secondary | ICD-10-CM | POA: Diagnosis not present

## 2017-03-04 DIAGNOSIS — E782 Mixed hyperlipidemia: Secondary | ICD-10-CM | POA: Diagnosis not present

## 2017-03-04 DIAGNOSIS — E1165 Type 2 diabetes mellitus with hyperglycemia: Secondary | ICD-10-CM | POA: Diagnosis not present

## 2017-03-04 DIAGNOSIS — Z6831 Body mass index (BMI) 31.0-31.9, adult: Secondary | ICD-10-CM | POA: Diagnosis not present

## 2017-03-04 DIAGNOSIS — M545 Low back pain: Secondary | ICD-10-CM | POA: Diagnosis not present

## 2017-05-16 DIAGNOSIS — K219 Gastro-esophageal reflux disease without esophagitis: Secondary | ICD-10-CM | POA: Diagnosis not present

## 2017-05-16 DIAGNOSIS — J209 Acute bronchitis, unspecified: Secondary | ICD-10-CM | POA: Diagnosis not present

## 2017-05-16 DIAGNOSIS — Z79899 Other long term (current) drug therapy: Secondary | ICD-10-CM | POA: Diagnosis not present

## 2017-05-16 DIAGNOSIS — Z7984 Long term (current) use of oral hypoglycemic drugs: Secondary | ICD-10-CM | POA: Diagnosis not present

## 2017-05-16 DIAGNOSIS — R05 Cough: Secondary | ICD-10-CM | POA: Diagnosis not present

## 2017-05-16 DIAGNOSIS — E119 Type 2 diabetes mellitus without complications: Secondary | ICD-10-CM | POA: Diagnosis not present

## 2017-05-19 DIAGNOSIS — E7800 Pure hypercholesterolemia, unspecified: Secondary | ICD-10-CM | POA: Diagnosis not present

## 2017-05-19 DIAGNOSIS — Z7984 Long term (current) use of oral hypoglycemic drugs: Secondary | ICD-10-CM | POA: Diagnosis not present

## 2017-05-19 DIAGNOSIS — Z79899 Other long term (current) drug therapy: Secondary | ICD-10-CM | POA: Diagnosis not present

## 2017-05-19 DIAGNOSIS — R0602 Shortness of breath: Secondary | ICD-10-CM | POA: Diagnosis not present

## 2017-05-19 DIAGNOSIS — R05 Cough: Secondary | ICD-10-CM | POA: Diagnosis not present

## 2017-05-19 DIAGNOSIS — K219 Gastro-esophageal reflux disease without esophagitis: Secondary | ICD-10-CM | POA: Diagnosis not present

## 2017-05-19 DIAGNOSIS — R9431 Abnormal electrocardiogram [ECG] [EKG]: Secondary | ICD-10-CM | POA: Diagnosis not present

## 2017-05-19 DIAGNOSIS — E1165 Type 2 diabetes mellitus with hyperglycemia: Secondary | ICD-10-CM | POA: Diagnosis not present

## 2017-06-21 DIAGNOSIS — K5730 Diverticulosis of large intestine without perforation or abscess without bleeding: Secondary | ICD-10-CM | POA: Diagnosis not present

## 2017-06-21 DIAGNOSIS — E1165 Type 2 diabetes mellitus with hyperglycemia: Secondary | ICD-10-CM | POA: Diagnosis not present

## 2017-06-21 DIAGNOSIS — Z800 Family history of malignant neoplasm of digestive organs: Secondary | ICD-10-CM | POA: Diagnosis not present

## 2017-06-21 DIAGNOSIS — I10 Essential (primary) hypertension: Secondary | ICD-10-CM | POA: Diagnosis not present

## 2017-06-21 DIAGNOSIS — K219 Gastro-esophageal reflux disease without esophagitis: Secondary | ICD-10-CM | POA: Diagnosis not present

## 2017-06-21 DIAGNOSIS — E782 Mixed hyperlipidemia: Secondary | ICD-10-CM | POA: Diagnosis not present

## 2017-06-28 DIAGNOSIS — E1165 Type 2 diabetes mellitus with hyperglycemia: Secondary | ICD-10-CM | POA: Diagnosis not present

## 2017-06-28 DIAGNOSIS — M5416 Radiculopathy, lumbar region: Secondary | ICD-10-CM | POA: Diagnosis not present

## 2017-06-28 DIAGNOSIS — I10 Essential (primary) hypertension: Secondary | ICD-10-CM | POA: Diagnosis not present

## 2017-06-28 DIAGNOSIS — Z6830 Body mass index (BMI) 30.0-30.9, adult: Secondary | ICD-10-CM | POA: Diagnosis not present

## 2017-06-28 DIAGNOSIS — Z23 Encounter for immunization: Secondary | ICD-10-CM | POA: Diagnosis not present

## 2017-06-28 DIAGNOSIS — E782 Mixed hyperlipidemia: Secondary | ICD-10-CM | POA: Diagnosis not present

## 2017-06-28 DIAGNOSIS — K5730 Diverticulosis of large intestine without perforation or abscess without bleeding: Secondary | ICD-10-CM | POA: Diagnosis not present

## 2017-06-28 DIAGNOSIS — K219 Gastro-esophageal reflux disease without esophagitis: Secondary | ICD-10-CM | POA: Diagnosis not present

## 2017-09-20 DIAGNOSIS — E1165 Type 2 diabetes mellitus with hyperglycemia: Secondary | ICD-10-CM | POA: Diagnosis not present

## 2017-09-20 DIAGNOSIS — I10 Essential (primary) hypertension: Secondary | ICD-10-CM | POA: Diagnosis not present

## 2017-09-20 DIAGNOSIS — K219 Gastro-esophageal reflux disease without esophagitis: Secondary | ICD-10-CM | POA: Diagnosis not present

## 2017-09-20 DIAGNOSIS — E782 Mixed hyperlipidemia: Secondary | ICD-10-CM | POA: Diagnosis not present

## 2017-09-28 DIAGNOSIS — I10 Essential (primary) hypertension: Secondary | ICD-10-CM | POA: Diagnosis not present

## 2017-09-28 DIAGNOSIS — Z6830 Body mass index (BMI) 30.0-30.9, adult: Secondary | ICD-10-CM | POA: Diagnosis not present

## 2017-09-28 DIAGNOSIS — K219 Gastro-esophageal reflux disease without esophagitis: Secondary | ICD-10-CM | POA: Diagnosis not present

## 2017-09-28 DIAGNOSIS — E1165 Type 2 diabetes mellitus with hyperglycemia: Secondary | ICD-10-CM | POA: Diagnosis not present

## 2017-09-28 DIAGNOSIS — E782 Mixed hyperlipidemia: Secondary | ICD-10-CM | POA: Diagnosis not present

## 2017-09-28 DIAGNOSIS — K5730 Diverticulosis of large intestine without perforation or abscess without bleeding: Secondary | ICD-10-CM | POA: Diagnosis not present

## 2017-12-23 DIAGNOSIS — E1165 Type 2 diabetes mellitus with hyperglycemia: Secondary | ICD-10-CM | POA: Diagnosis not present

## 2017-12-23 DIAGNOSIS — E782 Mixed hyperlipidemia: Secondary | ICD-10-CM | POA: Diagnosis not present

## 2017-12-23 DIAGNOSIS — I10 Essential (primary) hypertension: Secondary | ICD-10-CM | POA: Diagnosis not present

## 2017-12-23 DIAGNOSIS — Z800 Family history of malignant neoplasm of digestive organs: Secondary | ICD-10-CM | POA: Diagnosis not present

## 2017-12-23 DIAGNOSIS — K5730 Diverticulosis of large intestine without perforation or abscess without bleeding: Secondary | ICD-10-CM | POA: Diagnosis not present

## 2017-12-23 DIAGNOSIS — K219 Gastro-esophageal reflux disease without esophagitis: Secondary | ICD-10-CM | POA: Diagnosis not present

## 2017-12-29 DIAGNOSIS — E782 Mixed hyperlipidemia: Secondary | ICD-10-CM | POA: Diagnosis not present

## 2017-12-29 DIAGNOSIS — I10 Essential (primary) hypertension: Secondary | ICD-10-CM | POA: Diagnosis not present

## 2017-12-29 DIAGNOSIS — N182 Chronic kidney disease, stage 2 (mild): Secondary | ICD-10-CM | POA: Diagnosis not present

## 2017-12-29 DIAGNOSIS — M5416 Radiculopathy, lumbar region: Secondary | ICD-10-CM | POA: Diagnosis not present

## 2017-12-29 DIAGNOSIS — K5730 Diverticulosis of large intestine without perforation or abscess without bleeding: Secondary | ICD-10-CM | POA: Diagnosis not present

## 2017-12-29 DIAGNOSIS — E1122 Type 2 diabetes mellitus with diabetic chronic kidney disease: Secondary | ICD-10-CM | POA: Diagnosis not present

## 2017-12-29 DIAGNOSIS — Z6830 Body mass index (BMI) 30.0-30.9, adult: Secondary | ICD-10-CM | POA: Diagnosis not present

## 2017-12-29 DIAGNOSIS — E1165 Type 2 diabetes mellitus with hyperglycemia: Secondary | ICD-10-CM | POA: Diagnosis not present

## 2018-02-03 DIAGNOSIS — E1165 Type 2 diabetes mellitus with hyperglycemia: Secondary | ICD-10-CM | POA: Diagnosis not present

## 2018-02-03 DIAGNOSIS — M47892 Other spondylosis, cervical region: Secondary | ICD-10-CM | POA: Diagnosis not present

## 2018-02-03 DIAGNOSIS — E669 Obesity, unspecified: Secondary | ICD-10-CM | POA: Diagnosis not present

## 2018-02-03 DIAGNOSIS — K219 Gastro-esophageal reflux disease without esophagitis: Secondary | ICD-10-CM | POA: Diagnosis not present

## 2018-02-03 DIAGNOSIS — H9190 Unspecified hearing loss, unspecified ear: Secondary | ICD-10-CM | POA: Diagnosis not present

## 2018-02-03 DIAGNOSIS — M19011 Primary osteoarthritis, right shoulder: Secondary | ICD-10-CM | POA: Diagnosis not present

## 2018-02-03 DIAGNOSIS — E785 Hyperlipidemia, unspecified: Secondary | ICD-10-CM | POA: Diagnosis not present

## 2018-02-03 DIAGNOSIS — I10 Essential (primary) hypertension: Secondary | ICD-10-CM | POA: Diagnosis not present

## 2018-02-03 DIAGNOSIS — Z6830 Body mass index (BMI) 30.0-30.9, adult: Secondary | ICD-10-CM | POA: Diagnosis not present

## 2018-03-09 ENCOUNTER — Encounter: Payer: Medicare PPO | Attending: Family Medicine | Admitting: Nutrition

## 2018-03-09 ENCOUNTER — Encounter: Payer: Self-pay | Admitting: Nutrition

## 2018-03-09 VITALS — Ht 62.0 in | Wt 167.0 lb

## 2018-03-09 DIAGNOSIS — E119 Type 2 diabetes mellitus without complications: Secondary | ICD-10-CM | POA: Diagnosis not present

## 2018-03-09 DIAGNOSIS — E782 Mixed hyperlipidemia: Secondary | ICD-10-CM | POA: Insufficient documentation

## 2018-03-09 DIAGNOSIS — I10 Essential (primary) hypertension: Secondary | ICD-10-CM | POA: Diagnosis not present

## 2018-03-09 DIAGNOSIS — Z713 Dietary counseling and surveillance: Secondary | ICD-10-CM | POA: Insufficient documentation

## 2018-03-09 DIAGNOSIS — IMO0002 Reserved for concepts with insufficient information to code with codable children: Secondary | ICD-10-CM

## 2018-03-09 DIAGNOSIS — E1165 Type 2 diabetes mellitus with hyperglycemia: Secondary | ICD-10-CM

## 2018-03-09 DIAGNOSIS — K219 Gastro-esophageal reflux disease without esophagitis: Secondary | ICD-10-CM | POA: Insufficient documentation

## 2018-03-09 DIAGNOSIS — E118 Type 2 diabetes mellitus with unspecified complications: Secondary | ICD-10-CM

## 2018-03-09 NOTE — Progress Notes (Signed)
Diabetes Self-Management Education  Visit Type: First/Initial Appt. Start Time: 0800 Appt. End Time: 0900 03/09/2018  [name], identified by name and date of birth, is a 82 y.o. female with a diagnosis of Diabetes: Type 2.  Has had Dm for 15 years+. A1C > 12%. Sees Dr. Pleas Koch. Refused to start insulin at last MD visit. COmplains of yeast infection, dry mouth, thirsty, fatigue, dizziness, weakness, heachache, nausea. Meds: Metformin and Onglyza and Glimepiride.  BS 331 this am. 400's yesterday and 500+ previously. Willing to consider insulin now and make a PCP to start insulin.  ASSESSMENT  Height 5\' 2"  (1.575 m), weight 167 lb (75.8 kg). Body mass index is 30.54 kg/m.  Diabetes Self-Management Education - 03/09/18 0829      Visit Information   Visit Type  First/Initial      Initial Visit   Diabetes Type  Type 2    Are you currently following a meal plan?  No    Are you taking your medications as prescribed?  No    Date Diagnosed  2000      Health Coping   How would you rate your overall health?  Good      Psychosocial Assessment   Patient Belief/Attitude about Diabetes  Motivated to manage diabetes    Self-care barriers  Low literacy    Self-management support  Family    Other persons present  Patient    Patient Concerns  Nutrition/Meal planning;Medication;Monitoring;Healthy Lifestyle;Glycemic Control;Weight Control    Special Needs  Simplified materials    Preferred Learning Style  No preference indicated    Learning Readiness  Contemplating    How often do you need to have someone help you when you read instructions, pamphlets, or other written materials from your doctor or pharmacy?  2 - Rarely    What is the last grade level you completed in school?  9      Pre-Education Assessment   Patient understands the diabetes disease and treatment process.  Needs Instruction    Patient understands incorporating nutritional management into lifestyle.  Needs Instruction     Patient undertands incorporating physical activity into lifestyle.  Needs Instruction    Patient understands using medications safely.  Needs Instruction    Patient understands monitoring blood glucose, interpreting and using results  Needs Instruction    Patient understands prevention, detection, and treatment of acute complications.  Needs Instruction    Patient understands prevention, detection, and treatment of chronic complications.  Needs Instruction    Patient understands how to develop strategies to address psychosocial issues.  Needs Instruction    Patient understands how to develop strategies to promote health/change behavior.  Needs Instruction      Complications   Last HgB A1C per patient/outside source  12.9 %    How often do you check your blood sugar?  1-2 times/day    Fasting Blood glucose range (mg/dL)  >200    Postprandial Blood glucose range (mg/dL)  >200    Number of hypoglycemic episodes per month  0    Number of hyperglycemic episodes per week  21 BS been running > 300's    Can you tell when your blood sugar is high?  Yes    Have you had a dilated eye exam in the past 12 months?  Yes    Have you had a dental exam in the past 12 months?  No    Are you checking your feet?  No  Dietary Intake   Breakfast  cereal, milk, coffee--    Lunch  sandwich, water or salad- not hungry at lunch often.     Dinner  meat and vegetables, water    Beverage(s)  water      Exercise   Exercise Type  ADL's      Patient Education   Disease state   Definition of diabetes, type 1 and 2, and the diagnosis of diabetes;Factors that contribute to the development of diabetes;Explored patient's options for treatment of their diabetes    Nutrition management   Role of diet in the treatment of diabetes and the relationship between the three main macronutrients and blood glucose level    Physical activity and exercise   Role of exercise on diabetes management, blood pressure control and  cardiac health.    Medications  Reviewed patients medication for diabetes, action, purpose, timing of dose and side effects.;Taught/reviewed insulin injection, site rotation, insulin storage and needle disposal.    Monitoring  Taught/discussed recording of test results and interpretation of SMBG.;Identified appropriate SMBG and/or A1C goals.;Daily foot exams;Interpreting lab values - A1C, lipid, urine microalbumina.;Yearly dilated eye exam    Acute complications  Taught treatment of hypoglycemia - the 15 rule.;Discussed and identified patients' treatment of hyperglycemia.    Chronic complications  Relationship between chronic complications and blood glucose control;Lipid levels, blood glucose control and heart disease;Dental care;Nephropathy, what it is, prevention of, the use of ACE, ARB's and early detection of through urine microalbumia.;Identified and discussed with patient  current chronic complications;Retinopathy and reason for yearly dilated eye exams    Psychosocial adjustment  Worked with patient to identify barriers to care and solutions      Individualized Goals (developed by patient)   Nutrition  Follow meal plan discussed    Medications  take my medication as prescribed    Monitoring   test my blood glucose as discussed    Problem Solving  Make MD appt to start insulin today.    Reducing Risk  examine blood glucose patterns;get labs drawn;do foot checks daily;treat hypoglycemia with 15 grams of carbs if blood glucose less than 70mg /dL    Health Coping  ask for help with (comment) Diet and meds as needed      Post-Education Assessment   Patient understands the diabetes disease and treatment process.  Needs Review    Patient understands incorporating nutritional management into lifestyle.  Needs Review    Patient undertands incorporating physical activity into lifestyle.  Needs Review    Patient understands using medications safely.  Needs Review    Patient understands monitoring  blood glucose, interpreting and using results  Needs Review    Patient understands prevention, detection, and treatment of acute complications.  Needs Review    Patient understands prevention, detection, and treatment of chronic complications.  Needs Review    Patient understands how to develop strategies to address psychosocial issues.  Needs Review    Patient understands how to develop strategies to promote health/change behavior.  Needs Review      Outcomes   Expected Outcomes  Demonstrated interest in learning. Expect positive outcomes    Future DMSE  2 wks    Program Status  Completed       Individualized Plan for Diabetes Self-Management Training:   Learning Objective:  Patient will have a greater understanding of diabetes self-management. Patient education plan is to attend individual and/or group sessions per assessed needs and concerns.   Plan:   Patient Instructions  Goals 1. Follow My Plate 2. Call Dr. Pleas Koch office to get started on insulin 3. Eat three meals per day. 4.Test blood sugars 4 timies per day and record on sheet Bring meter and log sheets to all appointments Drink only water Take meds as presecribed   Expected Outcomes:  Demonstrated interest in learning. Expect positive outcomes  Education material provided: Meal plan card and My Plate  If problems or questions, patient to contact team via:  Phone and Email  Future DSME appointment: 2 wks Made appt for her to see [facility] due to chronic high blood sugars and yeast infection. She notes she is willing to start insulin now.

## 2018-03-09 NOTE — Patient Instructions (Signed)
Goals 1. Follow My Plate 2. Call Dr. Pleas Koch office to get started on insulin 3. Eat three meals per day. 4.Test blood sugars 4 timies per day and record on sheet Bring meter and log sheets to all appointments Drink only water Take meds as presecribed

## 2018-03-11 DIAGNOSIS — N182 Chronic kidney disease, stage 2 (mild): Secondary | ICD-10-CM | POA: Diagnosis not present

## 2018-03-11 DIAGNOSIS — Z6830 Body mass index (BMI) 30.0-30.9, adult: Secondary | ICD-10-CM | POA: Diagnosis not present

## 2018-03-11 DIAGNOSIS — B373 Candidiasis of vulva and vagina: Secondary | ICD-10-CM | POA: Diagnosis not present

## 2018-03-11 DIAGNOSIS — E1165 Type 2 diabetes mellitus with hyperglycemia: Secondary | ICD-10-CM | POA: Diagnosis not present

## 2018-03-16 ENCOUNTER — Encounter: Payer: Medicare PPO | Attending: Family Medicine | Admitting: Nutrition

## 2018-03-16 VITALS — Ht 62.0 in | Wt 166.0 lb

## 2018-03-16 DIAGNOSIS — Z6830 Body mass index (BMI) 30.0-30.9, adult: Secondary | ICD-10-CM | POA: Diagnosis not present

## 2018-03-16 DIAGNOSIS — I10 Essential (primary) hypertension: Secondary | ICD-10-CM | POA: Diagnosis not present

## 2018-03-16 DIAGNOSIS — IMO0002 Reserved for concepts with insufficient information to code with codable children: Secondary | ICD-10-CM

## 2018-03-16 DIAGNOSIS — E669 Obesity, unspecified: Secondary | ICD-10-CM

## 2018-03-16 DIAGNOSIS — E119 Type 2 diabetes mellitus without complications: Secondary | ICD-10-CM | POA: Diagnosis not present

## 2018-03-16 DIAGNOSIS — E782 Mixed hyperlipidemia: Secondary | ICD-10-CM | POA: Diagnosis not present

## 2018-03-16 DIAGNOSIS — Z794 Long term (current) use of insulin: Secondary | ICD-10-CM | POA: Diagnosis not present

## 2018-03-16 DIAGNOSIS — K219 Gastro-esophageal reflux disease without esophagitis: Secondary | ICD-10-CM | POA: Insufficient documentation

## 2018-03-16 DIAGNOSIS — Z713 Dietary counseling and surveillance: Secondary | ICD-10-CM | POA: Insufficient documentation

## 2018-03-16 DIAGNOSIS — E1165 Type 2 diabetes mellitus with hyperglycemia: Secondary | ICD-10-CM

## 2018-03-16 DIAGNOSIS — E118 Type 2 diabetes mellitus with unspecified complications: Secondary | ICD-10-CM

## 2018-03-16 NOTE — Patient Instructions (Signed)
Goals 1. Take 10 uinits of Basaglar per [facility] 2. Cut out all sodas, and juices 3. Drink only water Call me with BS readings on Monday to review Only test blood sugars before each meal and bedtimes.

## 2018-03-16 NOTE — Progress Notes (Signed)
Diabetes Self-Management Education  Visit Type:   Appt. Start Time: 0800 Appt. End Time: 0900 03/16/2018  [name], identified by name and date of birth, is a 82 y.o. female with a diagnosis of Diabetes:  Marland Kitchen [name], [place] saw her Friday, 12th. Started Engineer, agricultural. Was ordered 10 units of Basaglar and has only been taking 3 units due to a misunderstanding   ASSESSMENT  Height 5\' 2"  (1.575 m), weight 166 lb (75.3 kg). Body mass index is 30.36 kg/m. Individualized Plan for Diabetes Self-Management Training:  Needs 1:1 help with more insulin teaching, instructions and meal planning.  Learning Objective:  Patient will have a greater understanding of diabetes self-management. Patient education plan is to attend individual and/or group sessions per assessed needs and concerns.   B Egg, toast and fruit L) [place] sandwich, 1/2 bag chips, [place] D) Chicken, limas beans, broccoli, water Few cashews.   Plan:  Goals 1. Take 10 uinits of Basaglar per [facility] 2. Cut out all sodas, and juices 3. Drink only water Call me with BS readings on Monday to review Only test blood sugars before each meal and bedtimes.   Expected Outcomes:   improved blood sugars  Education material provided: Meal plan card and My Plate  If problems or questions, patient to contact team via:  Phone and Email  Future DSME appointment:   Made appt for her to see [facility] due to chronic high blood sugars and yeast infection.

## 2018-03-23 ENCOUNTER — Telehealth: Payer: Self-pay | Admitting: Nutrition

## 2018-03-23 ENCOUNTER — Encounter: Payer: Self-pay | Admitting: Nutrition

## 2018-03-23 NOTE — Telephone Encounter (Signed)
TC from pt to go over her BS readings since starting on Basaglar 10 units daily from [facility]. [facility]. Suppose to be changing over to Lantus on Saturday. Doesn't go back to see Dr. Pleas Koch til Aug 10th. She notes she is fatigued, thirsty, frequent urination, blurry vision and hungry a lot.  BS readings: BB       BL    BD      HS 7/19           277     280        275 7.20             211         -       275 7/21            268        266     319 7/22            290       298      366     291 7/23            255       335      238     221 7.24            225   B_ eggs, toast, bacon and cereal, banana L  [place] D)  Cabbage, 1/2 potato, hamburger 1 slice bread  Advised her to contact Dr. Lizbeth Bark office to ask about increasing her insulin amount before she comes to see DR. Burdiine in 2 weeks.  She verbalized understanding and will call him shortly. Encouraged her to continue to drink 4-5 bottles of water per day.

## 2018-03-26 DIAGNOSIS — E1165 Type 2 diabetes mellitus with hyperglycemia: Secondary | ICD-10-CM | POA: Diagnosis not present

## 2018-03-26 DIAGNOSIS — K5790 Diverticulosis of intestine, part unspecified, without perforation or abscess without bleeding: Secondary | ICD-10-CM | POA: Diagnosis not present

## 2018-03-26 DIAGNOSIS — K5730 Diverticulosis of large intestine without perforation or abscess without bleeding: Secondary | ICD-10-CM | POA: Diagnosis not present

## 2018-03-26 DIAGNOSIS — Z79899 Other long term (current) drug therapy: Secondary | ICD-10-CM | POA: Diagnosis not present

## 2018-03-26 DIAGNOSIS — Z794 Long term (current) use of insulin: Secondary | ICD-10-CM | POA: Diagnosis not present

## 2018-03-26 DIAGNOSIS — R110 Nausea: Secondary | ICD-10-CM | POA: Diagnosis not present

## 2018-03-26 DIAGNOSIS — E860 Dehydration: Secondary | ICD-10-CM | POA: Diagnosis not present

## 2018-03-26 DIAGNOSIS — K219 Gastro-esophageal reflux disease without esophagitis: Secondary | ICD-10-CM | POA: Diagnosis not present

## 2018-03-26 DIAGNOSIS — R109 Unspecified abdominal pain: Secondary | ICD-10-CM | POA: Diagnosis not present

## 2018-03-26 DIAGNOSIS — R350 Frequency of micturition: Secondary | ICD-10-CM | POA: Diagnosis not present

## 2018-03-26 DIAGNOSIS — S39011A Strain of muscle, fascia and tendon of abdomen, initial encounter: Secondary | ICD-10-CM | POA: Diagnosis not present

## 2018-03-30 NOTE — Telephone Encounter (Signed)
Opened in error

## 2018-04-11 DIAGNOSIS — E1122 Type 2 diabetes mellitus with diabetic chronic kidney disease: Secondary | ICD-10-CM | POA: Diagnosis not present

## 2018-04-11 DIAGNOSIS — E782 Mixed hyperlipidemia: Secondary | ICD-10-CM | POA: Diagnosis not present

## 2018-04-11 DIAGNOSIS — I10 Essential (primary) hypertension: Secondary | ICD-10-CM | POA: Diagnosis not present

## 2018-04-11 DIAGNOSIS — K219 Gastro-esophageal reflux disease without esophagitis: Secondary | ICD-10-CM | POA: Diagnosis not present

## 2018-04-11 DIAGNOSIS — N182 Chronic kidney disease, stage 2 (mild): Secondary | ICD-10-CM | POA: Diagnosis not present

## 2018-04-11 DIAGNOSIS — E1165 Type 2 diabetes mellitus with hyperglycemia: Secondary | ICD-10-CM | POA: Diagnosis not present

## 2018-04-18 DIAGNOSIS — E1122 Type 2 diabetes mellitus with diabetic chronic kidney disease: Secondary | ICD-10-CM | POA: Diagnosis not present

## 2018-04-18 DIAGNOSIS — N182 Chronic kidney disease, stage 2 (mild): Secondary | ICD-10-CM | POA: Diagnosis not present

## 2018-04-18 DIAGNOSIS — E1165 Type 2 diabetes mellitus with hyperglycemia: Secondary | ICD-10-CM | POA: Diagnosis not present

## 2018-04-18 DIAGNOSIS — M5416 Radiculopathy, lumbar region: Secondary | ICD-10-CM | POA: Diagnosis not present

## 2018-04-18 DIAGNOSIS — E782 Mixed hyperlipidemia: Secondary | ICD-10-CM | POA: Diagnosis not present

## 2018-04-18 DIAGNOSIS — Z6830 Body mass index (BMI) 30.0-30.9, adult: Secondary | ICD-10-CM | POA: Diagnosis not present

## 2018-04-18 DIAGNOSIS — I10 Essential (primary) hypertension: Secondary | ICD-10-CM | POA: Diagnosis not present

## 2018-04-18 DIAGNOSIS — K5730 Diverticulosis of large intestine without perforation or abscess without bleeding: Secondary | ICD-10-CM | POA: Diagnosis not present

## 2018-04-25 ENCOUNTER — Ambulatory Visit: Payer: Medicare PPO | Admitting: Nutrition

## 2018-05-04 ENCOUNTER — Encounter: Payer: Medicare PPO | Attending: Family Medicine | Admitting: Nutrition

## 2018-05-04 ENCOUNTER — Encounter: Payer: Self-pay | Admitting: Nutrition

## 2018-05-04 VITALS — Ht 62.0 in | Wt 167.0 lb

## 2018-05-04 DIAGNOSIS — K219 Gastro-esophageal reflux disease without esophagitis: Secondary | ICD-10-CM | POA: Insufficient documentation

## 2018-05-04 DIAGNOSIS — Z713 Dietary counseling and surveillance: Secondary | ICD-10-CM | POA: Insufficient documentation

## 2018-05-04 DIAGNOSIS — E669 Obesity, unspecified: Secondary | ICD-10-CM

## 2018-05-04 DIAGNOSIS — E1165 Type 2 diabetes mellitus with hyperglycemia: Secondary | ICD-10-CM

## 2018-05-04 DIAGNOSIS — E119 Type 2 diabetes mellitus without complications: Secondary | ICD-10-CM | POA: Diagnosis not present

## 2018-05-04 DIAGNOSIS — I10 Essential (primary) hypertension: Secondary | ICD-10-CM | POA: Diagnosis not present

## 2018-05-04 DIAGNOSIS — E782 Mixed hyperlipidemia: Secondary | ICD-10-CM | POA: Diagnosis not present

## 2018-05-04 DIAGNOSIS — IMO0002 Reserved for concepts with insufficient information to code with codable children: Secondary | ICD-10-CM

## 2018-05-04 DIAGNOSIS — E118 Type 2 diabetes mellitus with unspecified complications: Secondary | ICD-10-CM

## 2018-05-04 NOTE — Progress Notes (Signed)
Diabetes Self-Management Education  Visit Type:   Appt. Start Time: 1000 Appt. End Time: 1030 05/04/2018  [name], identified by name and date of birth, is a 82 y.o. female with a diagnosis of Diabetes:  .Sees Dr. Valli Glance. Currently taking 30 units of Lantus and 500 mg BID of Metformin.Marland Kitchen Has forgotten her insulin at night a few times due to falling asleep. She notes she has been injecting in legs instead of stomach most of the time. States the injection site is a little wet at times after injecting. Afraid of taking more insulin for fear of side effects.  BS logs reveal FBS 100-200s, 200-300's before meals and bedtime. She notes she no longer feels dizzy or like she is going to lose her balance when standing when her BS were much higher. Feels better overall though. C/o no energy at times and can't walk or exercise when she is so tired. BS reading reveal improvement in BS but still having highs before lunch, before dinner and bedtime.  She has fear and anxious about increasing insulin. She notes she gets depressed often when her BS are higher than she wants. Needs evaluation for depression and possible mild anxiety. Sees Dr. Pleas Koch 05-26-18 and will get labs done then. Making slow progress. Would benefit from increased insulin and possible meal time insulin for better BS control ASSESSMENT  Height 5\' 2"  (1.575 m), weight 167 lb (75.8 kg). Body mass index is 30.54 kg/m. Individualized Plan for Diabetes Self-Management Training:  Needs 1:1 help with more insulin teaching, instructions and meal planning.  Learning Objective:  Patient will have a greater understanding of diabetes self-management. Patient education plan is to attend individual and/or group sessions per assessed needs and concerns.   B 2 eggs and [place] sausage, 2 slices toast,  Coffee  L) Green beans, onions, broccoli, fried chicken, unsweet tea mixed with sweet, cornbread. D) Chicken, limas beans, broccoli,  water Few cashews.  Goals 1. Take 30 units of Lantus every night after dinner. About 7-8 pm 2. Drink 5 bottles of water every day 3. Inject insulin in stomach area only. Make sure the pen goes back to "0" after injecting 4. Eat vegetables for snacks. 5. Test blood twice a day; before beakfast and before bed.      Expected Outcomes:   improved blood sugars  Education material provided: Meal plan card and My Plate  If problems or questions, patient to contact team via:  Phone and Email  Future DSME appointment:   1 month. She will need assistance and reaassurance as insulin in increased to improve BS due to her fears of taking more insulin.

## 2018-05-04 NOTE — Patient Instructions (Signed)
Goals 1. Take 30 units of Lantus every night after dinner. About 7-8 pm 2. Drink 5 bottles of water every day 3. Inject insulin in stomach area only. Make sure the pen goes back to "0" after injecting 4. Eat vegetables for snacks. 5. Test blood twice a day; before beakfast and before bed.

## 2018-05-25 DIAGNOSIS — I10 Essential (primary) hypertension: Secondary | ICD-10-CM | POA: Diagnosis not present

## 2018-05-25 DIAGNOSIS — Z0001 Encounter for general adult medical examination with abnormal findings: Secondary | ICD-10-CM | POA: Diagnosis not present

## 2018-05-25 DIAGNOSIS — M5416 Radiculopathy, lumbar region: Secondary | ICD-10-CM | POA: Diagnosis not present

## 2018-05-25 DIAGNOSIS — Z6830 Body mass index (BMI) 30.0-30.9, adult: Secondary | ICD-10-CM | POA: Diagnosis not present

## 2018-05-25 DIAGNOSIS — E1122 Type 2 diabetes mellitus with diabetic chronic kidney disease: Secondary | ICD-10-CM | POA: Diagnosis not present

## 2018-06-01 ENCOUNTER — Ambulatory Visit: Payer: Medicare PPO | Admitting: Nutrition

## 2018-06-06 ENCOUNTER — Encounter: Payer: Medicare PPO | Attending: Family Medicine | Admitting: Nutrition

## 2018-06-06 VITALS — Ht 62.0 in | Wt 172.0 lb

## 2018-06-06 DIAGNOSIS — E118 Type 2 diabetes mellitus with unspecified complications: Secondary | ICD-10-CM

## 2018-06-06 DIAGNOSIS — E782 Mixed hyperlipidemia: Secondary | ICD-10-CM | POA: Diagnosis not present

## 2018-06-06 DIAGNOSIS — E119 Type 2 diabetes mellitus without complications: Secondary | ICD-10-CM | POA: Insufficient documentation

## 2018-06-06 DIAGNOSIS — Z713 Dietary counseling and surveillance: Secondary | ICD-10-CM | POA: Diagnosis not present

## 2018-06-06 DIAGNOSIS — K219 Gastro-esophageal reflux disease without esophagitis: Secondary | ICD-10-CM | POA: Insufficient documentation

## 2018-06-06 DIAGNOSIS — IMO0002 Reserved for concepts with insufficient information to code with codable children: Secondary | ICD-10-CM

## 2018-06-06 DIAGNOSIS — I10 Essential (primary) hypertension: Secondary | ICD-10-CM | POA: Diagnosis not present

## 2018-06-06 DIAGNOSIS — E1165 Type 2 diabetes mellitus with hyperglycemia: Secondary | ICD-10-CM

## 2018-06-06 NOTE — Patient Instructions (Signed)
Goals Keep up the great job!  Take 30 units of Lantus daily Walk when you can Eat fruit with meal intead of before going to bed. Try not to eat past 7 pm Continue to increase lower carb vegetables

## 2018-06-06 NOTE — Progress Notes (Signed)
Diabetes Self-Management Education  Visit Type:   Appt. Start Time: 1000 Appt. End Time: 1030 06/06/2018  [name], identified by name and date of birth, is a 82 y.o. female with a diagnosis of Diabetes:  .Sees Dr. Pleas Koch for PCP. Currently taking 30 units of Lantus and 500 mg BID of Metformin..  Saw Dr. Pleas Koch 2 weeks ago. A1C now 9.6%. No longer taking Ongliza. Can't take Losartan for hyperkalemia. Meter shows 14 day 147 mg/dl 30 day 170 mg/dl/ FBS 87-135 x 1 week.  BS 147-201 before bed. However, she has been insistent on eating a snack before bed for fear of low blood sugar. Reassured her that a snack after supper wasn't needed unless BS before bed was less than 100 mg/dl. She admits to drinking 1 soda and then drinks 16 oz of water right afterwards. A1C down 9% from 12.7%.  Making great progress. Trying to be more consistent with her meals, adding more vegetables and fruit and more water. Not exercising much due to fear of walking alone at home or outside.Was trying to go to [facility]. Seesm a little anxious at times about change. She feels better. Slow and stead progress. More consistent with her insulin at night. Gained 5 lbs , most likely from improved blood sugars and now on insulin providing better BS control. Still needs to work on cuttng out soda.    ASSESSMENT  Height _0  (1.575 m), weight 172 lb (78 kg). Body mass index is 31.46 kg/m. Individualized Plan for Diabetes Self-Management Training:  Needs 1:1 help with more insulin teaching, instructions and meal planning.  Learning Objective:  Patient will have a greater understanding of diabetes self-management. Patient education plan is to attend individual and/or group sessions per assessed needs and concerns.   B egg and [place] sausage, coffee, toast, L) [place], broccoli, green beans, chicken, and mac/cheese, water D) Sandwich, Few cashews.   Goals Keep up the great job!  Take 30 units of Lantus  daily Walk when you can Eat fruit with meal intead of before going to bed. Try not to eat past 7 pm Continue to increase lower carb vegetables     Expected Outcomes:   improved blood sugars  Education material provided: Meal plan card and My Plate  If problems or questions, patient to contact team via:  Phone and Email  Future DSME appointment:   3 month.

## 2018-06-13 ENCOUNTER — Encounter: Payer: Self-pay | Admitting: Nutrition

## 2018-07-26 DIAGNOSIS — L03031 Cellulitis of right toe: Secondary | ICD-10-CM | POA: Diagnosis not present

## 2018-08-18 DIAGNOSIS — N182 Chronic kidney disease, stage 2 (mild): Secondary | ICD-10-CM | POA: Diagnosis not present

## 2018-08-18 DIAGNOSIS — I10 Essential (primary) hypertension: Secondary | ICD-10-CM | POA: Diagnosis not present

## 2018-08-18 DIAGNOSIS — Z23 Encounter for immunization: Secondary | ICD-10-CM | POA: Diagnosis not present

## 2018-08-18 DIAGNOSIS — K5730 Diverticulosis of large intestine without perforation or abscess without bleeding: Secondary | ICD-10-CM | POA: Diagnosis not present

## 2018-08-18 DIAGNOSIS — E1122 Type 2 diabetes mellitus with diabetic chronic kidney disease: Secondary | ICD-10-CM | POA: Diagnosis not present

## 2018-08-18 DIAGNOSIS — Z6832 Body mass index (BMI) 32.0-32.9, adult: Secondary | ICD-10-CM | POA: Diagnosis not present

## 2018-08-18 DIAGNOSIS — E1165 Type 2 diabetes mellitus with hyperglycemia: Secondary | ICD-10-CM | POA: Diagnosis not present

## 2018-08-18 DIAGNOSIS — K219 Gastro-esophageal reflux disease without esophagitis: Secondary | ICD-10-CM | POA: Diagnosis not present

## 2018-08-18 DIAGNOSIS — M545 Low back pain: Secondary | ICD-10-CM | POA: Diagnosis not present

## 2018-08-18 DIAGNOSIS — E782 Mixed hyperlipidemia: Secondary | ICD-10-CM | POA: Diagnosis not present

## 2018-08-22 DIAGNOSIS — Z6832 Body mass index (BMI) 32.0-32.9, adult: Secondary | ICD-10-CM | POA: Diagnosis not present

## 2018-08-22 DIAGNOSIS — K5730 Diverticulosis of large intestine without perforation or abscess without bleeding: Secondary | ICD-10-CM | POA: Diagnosis not present

## 2018-08-22 DIAGNOSIS — N182 Chronic kidney disease, stage 2 (mild): Secondary | ICD-10-CM | POA: Diagnosis not present

## 2018-08-22 DIAGNOSIS — K219 Gastro-esophageal reflux disease without esophagitis: Secondary | ICD-10-CM | POA: Diagnosis not present

## 2018-08-22 DIAGNOSIS — M5416 Radiculopathy, lumbar region: Secondary | ICD-10-CM | POA: Diagnosis not present

## 2018-08-22 DIAGNOSIS — E1122 Type 2 diabetes mellitus with diabetic chronic kidney disease: Secondary | ICD-10-CM | POA: Diagnosis not present

## 2018-08-22 DIAGNOSIS — E782 Mixed hyperlipidemia: Secondary | ICD-10-CM | POA: Diagnosis not present

## 2018-08-22 DIAGNOSIS — I10 Essential (primary) hypertension: Secondary | ICD-10-CM | POA: Diagnosis not present

## 2018-09-14 ENCOUNTER — Ambulatory Visit: Payer: Medicare PPO | Admitting: Nutrition

## 2018-09-21 DIAGNOSIS — Z6832 Body mass index (BMI) 32.0-32.9, adult: Secondary | ICD-10-CM | POA: Diagnosis not present

## 2018-09-21 DIAGNOSIS — E1165 Type 2 diabetes mellitus with hyperglycemia: Secondary | ICD-10-CM | POA: Diagnosis not present

## 2018-09-21 DIAGNOSIS — N182 Chronic kidney disease, stage 2 (mild): Secondary | ICD-10-CM | POA: Diagnosis not present

## 2018-09-21 DIAGNOSIS — R5383 Other fatigue: Secondary | ICD-10-CM | POA: Diagnosis not present

## 2018-09-22 ENCOUNTER — Ambulatory Visit: Payer: Medicare PPO | Admitting: Nutrition

## 2018-10-11 DIAGNOSIS — Z87891 Personal history of nicotine dependence: Secondary | ICD-10-CM | POA: Diagnosis not present

## 2018-10-11 DIAGNOSIS — R110 Nausea: Secondary | ICD-10-CM | POA: Diagnosis not present

## 2018-10-11 DIAGNOSIS — I700 Atherosclerosis of aorta: Secondary | ICD-10-CM | POA: Diagnosis not present

## 2018-10-11 DIAGNOSIS — Z79899 Other long term (current) drug therapy: Secondary | ICD-10-CM | POA: Diagnosis not present

## 2018-10-11 DIAGNOSIS — K5730 Diverticulosis of large intestine without perforation or abscess without bleeding: Secondary | ICD-10-CM | POA: Diagnosis not present

## 2018-10-11 DIAGNOSIS — E119 Type 2 diabetes mellitus without complications: Secondary | ICD-10-CM | POA: Diagnosis not present

## 2018-10-11 DIAGNOSIS — R109 Unspecified abdominal pain: Secondary | ICD-10-CM | POA: Diagnosis not present

## 2018-10-11 DIAGNOSIS — Z794 Long term (current) use of insulin: Secondary | ICD-10-CM | POA: Diagnosis not present

## 2018-10-11 DIAGNOSIS — K219 Gastro-esophageal reflux disease without esophagitis: Secondary | ICD-10-CM | POA: Diagnosis not present

## 2018-10-11 DIAGNOSIS — R1084 Generalized abdominal pain: Secondary | ICD-10-CM | POA: Diagnosis not present

## 2018-10-17 ENCOUNTER — Encounter: Payer: Medicare PPO | Attending: Family Medicine | Admitting: Nutrition

## 2018-10-17 VITALS — Ht 62.0 in | Wt 177.0 lb

## 2018-10-17 DIAGNOSIS — E669 Obesity, unspecified: Secondary | ICD-10-CM | POA: Diagnosis not present

## 2018-10-17 DIAGNOSIS — E1165 Type 2 diabetes mellitus with hyperglycemia: Secondary | ICD-10-CM | POA: Insufficient documentation

## 2018-10-17 DIAGNOSIS — E118 Type 2 diabetes mellitus with unspecified complications: Secondary | ICD-10-CM | POA: Diagnosis not present

## 2018-10-17 DIAGNOSIS — IMO0002 Reserved for concepts with insufficient information to code with codable children: Secondary | ICD-10-CM

## 2018-10-17 NOTE — Patient Instructions (Addendum)
Goals 1. Cut out soda and sweet tea 2. Drink only water, black coffee and unsweet tea 3. Don't eat past 7 pm s it's veggies or sugar free jello  Drink 5 bottles of water per day Walk in house or outside for 30 minutes. Get A1C down to 7.5% :Lose 5 lbs

## 2018-10-17 NOTE — Progress Notes (Signed)
Diabetes Self-Management Education  Visit Type:   Appt. Start Time: 1000 Appt. End Time: 1030 10/17/2018  [name], identified by name and date of birth, is a 83 y.o. female with a diagnosis of Diabetes:  .Sees Dr. Pleas Koch for PCP. Currently taking 30 units of Lantus and 500 mg BID of Metformin. Not on Onglyza anymore or Glipizide.     A1C was 8.4% from  9.6%.  Gained 5 lbs.  Making progress. Trying to be more active.  ASSESSMENT  Height 5\' 2"  (1.575 m), weight 177 lb (80.3 kg). Body mass index is 32.37 kg/m. Individualized Plan for Diabetes Self-Management Training:  Needs 1:1 help with more insulin teaching, instructions and meal planning.  Learning Objective:  Patient will have a greater understanding of diabetes self-management. Patient education plan is to attend individual and/or group sessions per assessed needs and concerns.   B Oatmeal and egg and prunes and coffe L)cabage, meatloaf, 1/2 D) cabbage, meatloaf and water.  Snack: lemon cake..  Goals 1. Cut out soda and sweet tea 2. Drink only water, black coffee and unsweet tea 3. Don't eat past 7 pm s it's veggies or sugar free jello  Drink 5 bottles of water per day Walk in house or outside for 30 minutes. Get A1C down to 7.5% :Lose 5 lbs      Expected Outcomes:   improved blood sugars  Education material provided: Meal plan card and My Plate  If problems or questions, patient to contact team via:  Phone and Email  Future DSME appointment:   3 month.

## 2018-10-26 ENCOUNTER — Encounter: Payer: Self-pay | Admitting: Nutrition

## 2018-11-02 DIAGNOSIS — Z6832 Body mass index (BMI) 32.0-32.9, adult: Secondary | ICD-10-CM | POA: Diagnosis not present

## 2018-11-02 DIAGNOSIS — M5412 Radiculopathy, cervical region: Secondary | ICD-10-CM | POA: Diagnosis not present

## 2018-11-02 DIAGNOSIS — M47812 Spondylosis without myelopathy or radiculopathy, cervical region: Secondary | ICD-10-CM | POA: Diagnosis not present

## 2018-11-07 DIAGNOSIS — M519 Unspecified thoracic, thoracolumbar and lumbosacral intervertebral disc disorder: Secondary | ICD-10-CM | POA: Diagnosis not present

## 2018-11-07 DIAGNOSIS — M542 Cervicalgia: Secondary | ICD-10-CM | POA: Insufficient documentation

## 2018-12-06 ENCOUNTER — Ambulatory Visit: Payer: Medicare PPO | Admitting: Nutrition

## 2018-12-12 DIAGNOSIS — M5136 Other intervertebral disc degeneration, lumbar region: Secondary | ICD-10-CM | POA: Insufficient documentation

## 2018-12-19 DIAGNOSIS — I10 Essential (primary) hypertension: Secondary | ICD-10-CM | POA: Diagnosis not present

## 2018-12-19 DIAGNOSIS — E1165 Type 2 diabetes mellitus with hyperglycemia: Secondary | ICD-10-CM | POA: Diagnosis not present

## 2018-12-19 DIAGNOSIS — E1122 Type 2 diabetes mellitus with diabetic chronic kidney disease: Secondary | ICD-10-CM | POA: Diagnosis not present

## 2018-12-19 DIAGNOSIS — K219 Gastro-esophageal reflux disease without esophagitis: Secondary | ICD-10-CM | POA: Diagnosis not present

## 2018-12-23 DIAGNOSIS — M5412 Radiculopathy, cervical region: Secondary | ICD-10-CM | POA: Diagnosis not present

## 2018-12-23 DIAGNOSIS — I10 Essential (primary) hypertension: Secondary | ICD-10-CM | POA: Diagnosis not present

## 2018-12-23 DIAGNOSIS — E1122 Type 2 diabetes mellitus with diabetic chronic kidney disease: Secondary | ICD-10-CM | POA: Diagnosis not present

## 2018-12-23 DIAGNOSIS — M5416 Radiculopathy, lumbar region: Secondary | ICD-10-CM | POA: Diagnosis not present

## 2018-12-23 DIAGNOSIS — E782 Mixed hyperlipidemia: Secondary | ICD-10-CM | POA: Diagnosis not present

## 2018-12-23 DIAGNOSIS — Z6832 Body mass index (BMI) 32.0-32.9, adult: Secondary | ICD-10-CM | POA: Diagnosis not present

## 2018-12-23 DIAGNOSIS — G542 Cervical root disorders, not elsewhere classified: Secondary | ICD-10-CM | POA: Diagnosis not present

## 2018-12-23 DIAGNOSIS — K5730 Diverticulosis of large intestine without perforation or abscess without bleeding: Secondary | ICD-10-CM | POA: Diagnosis not present

## 2019-01-09 DIAGNOSIS — M5412 Radiculopathy, cervical region: Secondary | ICD-10-CM | POA: Diagnosis not present

## 2019-01-09 DIAGNOSIS — M542 Cervicalgia: Secondary | ICD-10-CM | POA: Diagnosis not present

## 2019-01-09 DIAGNOSIS — Z981 Arthrodesis status: Secondary | ICD-10-CM | POA: Diagnosis not present

## 2019-01-16 DIAGNOSIS — M542 Cervicalgia: Secondary | ICD-10-CM | POA: Diagnosis not present

## 2019-01-20 DIAGNOSIS — R5383 Other fatigue: Secondary | ICD-10-CM | POA: Diagnosis not present

## 2019-01-20 DIAGNOSIS — I10 Essential (primary) hypertension: Secondary | ICD-10-CM | POA: Diagnosis not present

## 2019-01-20 DIAGNOSIS — E559 Vitamin D deficiency, unspecified: Secondary | ICD-10-CM | POA: Diagnosis not present

## 2019-01-20 DIAGNOSIS — E782 Mixed hyperlipidemia: Secondary | ICD-10-CM | POA: Diagnosis not present

## 2019-01-20 DIAGNOSIS — K219 Gastro-esophageal reflux disease without esophagitis: Secondary | ICD-10-CM | POA: Diagnosis not present

## 2019-01-20 DIAGNOSIS — R2681 Unsteadiness on feet: Secondary | ICD-10-CM | POA: Diagnosis not present

## 2019-01-20 DIAGNOSIS — R110 Nausea: Secondary | ICD-10-CM | POA: Diagnosis not present

## 2019-01-20 DIAGNOSIS — M5412 Radiculopathy, cervical region: Secondary | ICD-10-CM | POA: Diagnosis not present

## 2019-01-20 DIAGNOSIS — Z6832 Body mass index (BMI) 32.0-32.9, adult: Secondary | ICD-10-CM | POA: Diagnosis not present

## 2019-01-20 DIAGNOSIS — E1122 Type 2 diabetes mellitus with diabetic chronic kidney disease: Secondary | ICD-10-CM | POA: Diagnosis not present

## 2019-01-26 DIAGNOSIS — R110 Nausea: Secondary | ICD-10-CM | POA: Diagnosis not present

## 2019-01-26 DIAGNOSIS — R531 Weakness: Secondary | ICD-10-CM | POA: Diagnosis not present

## 2019-01-26 DIAGNOSIS — R2681 Unsteadiness on feet: Secondary | ICD-10-CM | POA: Diagnosis not present

## 2019-01-27 DIAGNOSIS — M4712 Other spondylosis with myelopathy, cervical region: Secondary | ICD-10-CM | POA: Insufficient documentation

## 2019-01-27 DIAGNOSIS — M4802 Spinal stenosis, cervical region: Secondary | ICD-10-CM | POA: Insufficient documentation

## 2019-01-27 DIAGNOSIS — M4711 Other spondylosis with myelopathy, occipito-atlanto-axial region: Secondary | ICD-10-CM | POA: Diagnosis not present

## 2019-01-27 DIAGNOSIS — M519 Unspecified thoracic, thoracolumbar and lumbosacral intervertebral disc disorder: Secondary | ICD-10-CM | POA: Diagnosis not present

## 2019-03-03 DIAGNOSIS — M4712 Other spondylosis with myelopathy, cervical region: Secondary | ICD-10-CM | POA: Diagnosis not present

## 2019-03-03 DIAGNOSIS — M47812 Spondylosis without myelopathy or radiculopathy, cervical region: Secondary | ICD-10-CM | POA: Diagnosis not present

## 2019-03-03 DIAGNOSIS — M5412 Radiculopathy, cervical region: Secondary | ICD-10-CM | POA: Diagnosis not present

## 2019-03-03 DIAGNOSIS — E1122 Type 2 diabetes mellitus with diabetic chronic kidney disease: Secondary | ICD-10-CM | POA: Diagnosis not present

## 2019-03-03 DIAGNOSIS — M5416 Radiculopathy, lumbar region: Secondary | ICD-10-CM | POA: Diagnosis not present

## 2019-03-03 DIAGNOSIS — G542 Cervical root disorders, not elsewhere classified: Secondary | ICD-10-CM | POA: Diagnosis not present

## 2019-03-03 DIAGNOSIS — B351 Tinea unguium: Secondary | ICD-10-CM | POA: Diagnosis not present

## 2019-03-03 DIAGNOSIS — I10 Essential (primary) hypertension: Secondary | ICD-10-CM | POA: Diagnosis not present

## 2019-03-14 DIAGNOSIS — E782 Mixed hyperlipidemia: Secondary | ICD-10-CM | POA: Diagnosis not present

## 2019-03-22 DIAGNOSIS — M4712 Other spondylosis with myelopathy, cervical region: Secondary | ICD-10-CM | POA: Diagnosis not present

## 2019-03-22 DIAGNOSIS — M5416 Radiculopathy, lumbar region: Secondary | ICD-10-CM | POA: Diagnosis not present

## 2019-03-22 DIAGNOSIS — G542 Cervical root disorders, not elsewhere classified: Secondary | ICD-10-CM | POA: Diagnosis not present

## 2019-03-22 DIAGNOSIS — M5412 Radiculopathy, cervical region: Secondary | ICD-10-CM | POA: Diagnosis not present

## 2019-03-22 DIAGNOSIS — Z6831 Body mass index (BMI) 31.0-31.9, adult: Secondary | ICD-10-CM | POA: Diagnosis not present

## 2019-03-22 DIAGNOSIS — E1122 Type 2 diabetes mellitus with diabetic chronic kidney disease: Secondary | ICD-10-CM | POA: Diagnosis not present

## 2019-03-22 DIAGNOSIS — M47812 Spondylosis without myelopathy or radiculopathy, cervical region: Secondary | ICD-10-CM | POA: Diagnosis not present

## 2019-03-22 DIAGNOSIS — E782 Mixed hyperlipidemia: Secondary | ICD-10-CM | POA: Diagnosis not present

## 2019-03-28 DIAGNOSIS — M6281 Muscle weakness (generalized): Secondary | ICD-10-CM | POA: Diagnosis not present

## 2019-03-28 DIAGNOSIS — R2689 Other abnormalities of gait and mobility: Secondary | ICD-10-CM | POA: Diagnosis not present

## 2019-03-28 DIAGNOSIS — M5412 Radiculopathy, cervical region: Secondary | ICD-10-CM | POA: Diagnosis not present

## 2019-03-30 DIAGNOSIS — R2689 Other abnormalities of gait and mobility: Secondary | ICD-10-CM | POA: Diagnosis not present

## 2019-03-30 DIAGNOSIS — M6281 Muscle weakness (generalized): Secondary | ICD-10-CM | POA: Diagnosis not present

## 2019-03-30 DIAGNOSIS — M5412 Radiculopathy, cervical region: Secondary | ICD-10-CM | POA: Diagnosis not present

## 2019-04-04 DIAGNOSIS — R2689 Other abnormalities of gait and mobility: Secondary | ICD-10-CM | POA: Diagnosis not present

## 2019-04-04 DIAGNOSIS — M6281 Muscle weakness (generalized): Secondary | ICD-10-CM | POA: Diagnosis not present

## 2019-04-04 DIAGNOSIS — M5412 Radiculopathy, cervical region: Secondary | ICD-10-CM | POA: Diagnosis not present

## 2019-04-06 DIAGNOSIS — M5412 Radiculopathy, cervical region: Secondary | ICD-10-CM | POA: Diagnosis not present

## 2019-04-06 DIAGNOSIS — R2689 Other abnormalities of gait and mobility: Secondary | ICD-10-CM | POA: Diagnosis not present

## 2019-04-06 DIAGNOSIS — M6281 Muscle weakness (generalized): Secondary | ICD-10-CM | POA: Diagnosis not present

## 2019-04-13 DIAGNOSIS — R2689 Other abnormalities of gait and mobility: Secondary | ICD-10-CM | POA: Diagnosis not present

## 2019-04-13 DIAGNOSIS — M5412 Radiculopathy, cervical region: Secondary | ICD-10-CM | POA: Diagnosis not present

## 2019-04-13 DIAGNOSIS — M6281 Muscle weakness (generalized): Secondary | ICD-10-CM | POA: Diagnosis not present

## 2019-04-18 DIAGNOSIS — M5412 Radiculopathy, cervical region: Secondary | ICD-10-CM | POA: Diagnosis not present

## 2019-04-18 DIAGNOSIS — M6281 Muscle weakness (generalized): Secondary | ICD-10-CM | POA: Diagnosis not present

## 2019-04-18 DIAGNOSIS — R2689 Other abnormalities of gait and mobility: Secondary | ICD-10-CM | POA: Diagnosis not present

## 2019-04-19 DIAGNOSIS — E1122 Type 2 diabetes mellitus with diabetic chronic kidney disease: Secondary | ICD-10-CM | POA: Diagnosis not present

## 2019-04-19 DIAGNOSIS — G542 Cervical root disorders, not elsewhere classified: Secondary | ICD-10-CM | POA: Diagnosis not present

## 2019-04-19 DIAGNOSIS — N182 Chronic kidney disease, stage 2 (mild): Secondary | ICD-10-CM | POA: Diagnosis not present

## 2019-04-19 DIAGNOSIS — R2681 Unsteadiness on feet: Secondary | ICD-10-CM | POA: Diagnosis not present

## 2019-04-19 DIAGNOSIS — M4712 Other spondylosis with myelopathy, cervical region: Secondary | ICD-10-CM | POA: Diagnosis not present

## 2019-04-19 DIAGNOSIS — Z6831 Body mass index (BMI) 31.0-31.9, adult: Secondary | ICD-10-CM | POA: Diagnosis not present

## 2019-04-19 DIAGNOSIS — M5412 Radiculopathy, cervical region: Secondary | ICD-10-CM | POA: Diagnosis not present

## 2019-04-19 DIAGNOSIS — R5383 Other fatigue: Secondary | ICD-10-CM | POA: Diagnosis not present

## 2019-04-20 DIAGNOSIS — R2689 Other abnormalities of gait and mobility: Secondary | ICD-10-CM | POA: Diagnosis not present

## 2019-04-20 DIAGNOSIS — M6281 Muscle weakness (generalized): Secondary | ICD-10-CM | POA: Diagnosis not present

## 2019-04-20 DIAGNOSIS — M5412 Radiculopathy, cervical region: Secondary | ICD-10-CM | POA: Diagnosis not present

## 2019-04-25 DIAGNOSIS — M6281 Muscle weakness (generalized): Secondary | ICD-10-CM | POA: Diagnosis not present

## 2019-04-25 DIAGNOSIS — R202 Paresthesia of skin: Secondary | ICD-10-CM | POA: Diagnosis not present

## 2019-04-25 DIAGNOSIS — M5412 Radiculopathy, cervical region: Secondary | ICD-10-CM | POA: Diagnosis not present

## 2019-04-25 DIAGNOSIS — R531 Weakness: Secondary | ICD-10-CM | POA: Diagnosis not present

## 2019-04-25 DIAGNOSIS — R2689 Other abnormalities of gait and mobility: Secondary | ICD-10-CM | POA: Diagnosis not present

## 2019-04-25 DIAGNOSIS — R200 Anesthesia of skin: Secondary | ICD-10-CM | POA: Diagnosis not present

## 2019-04-27 DIAGNOSIS — R2689 Other abnormalities of gait and mobility: Secondary | ICD-10-CM | POA: Diagnosis not present

## 2019-04-27 DIAGNOSIS — M6281 Muscle weakness (generalized): Secondary | ICD-10-CM | POA: Diagnosis not present

## 2019-04-27 DIAGNOSIS — M5412 Radiculopathy, cervical region: Secondary | ICD-10-CM | POA: Diagnosis not present

## 2019-05-01 DIAGNOSIS — E782 Mixed hyperlipidemia: Secondary | ICD-10-CM | POA: Diagnosis not present

## 2019-05-01 DIAGNOSIS — E1122 Type 2 diabetes mellitus with diabetic chronic kidney disease: Secondary | ICD-10-CM | POA: Diagnosis not present

## 2019-05-01 DIAGNOSIS — I10 Essential (primary) hypertension: Secondary | ICD-10-CM | POA: Diagnosis not present

## 2019-05-02 DIAGNOSIS — M6281 Muscle weakness (generalized): Secondary | ICD-10-CM | POA: Diagnosis not present

## 2019-05-02 DIAGNOSIS — R2689 Other abnormalities of gait and mobility: Secondary | ICD-10-CM | POA: Diagnosis not present

## 2019-05-02 DIAGNOSIS — M5412 Radiculopathy, cervical region: Secondary | ICD-10-CM | POA: Diagnosis not present

## 2019-05-09 DIAGNOSIS — M6281 Muscle weakness (generalized): Secondary | ICD-10-CM | POA: Diagnosis not present

## 2019-05-09 DIAGNOSIS — R2689 Other abnormalities of gait and mobility: Secondary | ICD-10-CM | POA: Diagnosis not present

## 2019-05-09 DIAGNOSIS — M5412 Radiculopathy, cervical region: Secondary | ICD-10-CM | POA: Diagnosis not present

## 2019-05-31 DIAGNOSIS — I10 Essential (primary) hypertension: Secondary | ICD-10-CM | POA: Diagnosis not present

## 2019-05-31 DIAGNOSIS — E782 Mixed hyperlipidemia: Secondary | ICD-10-CM | POA: Diagnosis not present

## 2019-06-26 DIAGNOSIS — E782 Mixed hyperlipidemia: Secondary | ICD-10-CM | POA: Diagnosis not present

## 2019-06-26 DIAGNOSIS — I10 Essential (primary) hypertension: Secondary | ICD-10-CM | POA: Diagnosis not present

## 2019-06-26 DIAGNOSIS — N182 Chronic kidney disease, stage 2 (mild): Secondary | ICD-10-CM | POA: Diagnosis not present

## 2019-06-26 DIAGNOSIS — R5383 Other fatigue: Secondary | ICD-10-CM | POA: Diagnosis not present

## 2019-06-26 DIAGNOSIS — E1165 Type 2 diabetes mellitus with hyperglycemia: Secondary | ICD-10-CM | POA: Diagnosis not present

## 2019-06-26 DIAGNOSIS — K219 Gastro-esophageal reflux disease without esophagitis: Secondary | ICD-10-CM | POA: Diagnosis not present

## 2019-06-29 DIAGNOSIS — Z6831 Body mass index (BMI) 31.0-31.9, adult: Secondary | ICD-10-CM | POA: Diagnosis not present

## 2019-06-29 DIAGNOSIS — E1122 Type 2 diabetes mellitus with diabetic chronic kidney disease: Secondary | ICD-10-CM | POA: Diagnosis not present

## 2019-06-29 DIAGNOSIS — G542 Cervical root disorders, not elsewhere classified: Secondary | ICD-10-CM | POA: Diagnosis not present

## 2019-06-29 DIAGNOSIS — M4712 Other spondylosis with myelopathy, cervical region: Secondary | ICD-10-CM | POA: Diagnosis not present

## 2019-06-29 DIAGNOSIS — Z23 Encounter for immunization: Secondary | ICD-10-CM | POA: Diagnosis not present

## 2019-06-29 DIAGNOSIS — R2681 Unsteadiness on feet: Secondary | ICD-10-CM | POA: Diagnosis not present

## 2019-06-29 DIAGNOSIS — M5412 Radiculopathy, cervical region: Secondary | ICD-10-CM | POA: Diagnosis not present

## 2019-06-29 DIAGNOSIS — R5383 Other fatigue: Secondary | ICD-10-CM | POA: Diagnosis not present

## 2019-07-06 DIAGNOSIS — R2689 Other abnormalities of gait and mobility: Secondary | ICD-10-CM | POA: Diagnosis not present

## 2019-07-31 DIAGNOSIS — I10 Essential (primary) hypertension: Secondary | ICD-10-CM | POA: Diagnosis not present

## 2019-07-31 DIAGNOSIS — E1122 Type 2 diabetes mellitus with diabetic chronic kidney disease: Secondary | ICD-10-CM | POA: Diagnosis not present

## 2019-08-17 ENCOUNTER — Other Ambulatory Visit: Payer: Self-pay

## 2019-08-17 ENCOUNTER — Encounter: Payer: Self-pay | Admitting: Neurology

## 2019-08-17 ENCOUNTER — Ambulatory Visit (INDEPENDENT_AMBULATORY_CARE_PROVIDER_SITE_OTHER): Payer: Medicare PPO | Admitting: Neurology

## 2019-08-17 VITALS — BP 128/74 | HR 89 | Ht 62.0 in | Wt 165.0 lb

## 2019-08-17 DIAGNOSIS — Z9181 History of falling: Secondary | ICD-10-CM

## 2019-08-17 DIAGNOSIS — R2689 Other abnormalities of gait and mobility: Secondary | ICD-10-CM

## 2019-08-17 NOTE — Progress Notes (Signed)
Subjective:    Patient ID: [name] is a 83 y.o. female.  HPI     Star Age, MD, PhD [facility] [address] [place], [place] [address]  Dear Dr. Redmond Baseman, I saw your patient, [name], upon your kind request in my neurologic clinic today for initial consultation of her balance problem.  The patient is accompanied by her GD today.  As you know, [name] is an 83 year old right-handed woman with an underlying met history of hypertension, diabetes, degenerative spine disease with status post surgery to the lower back (Dr. Christella Noa) and neck (Dr. Rolena Infante), status post knee surgery, L elbow fracture, and borderline obesity, who reports problems with her balance for the past 2 years.  She reports that she was diagnosed with diabetes in the 70s and she started noticing occasional problems with walking straight, she would at times severe in one direction.  In the recent past she has had 2 falls.  She was in the home both times.  She fell backwards recently and hit her head against the corner of the furnace she says.  She had bent down to pick something up and when she stood up she overshot.  This happened on 07/02/2019.  On 1123, she fell in the bathroom after standing up from the toilet commode.  She does have occasional lightheadedness when standing up quickly.  She has a 4 pronged cane available and a walker from when she had back and neck surgeries but has not been using either.  She hydrates well with water, estimates that she drinks 3-4 bottles of water per day and up to 2 bottles per night.  She lives alone, she is widowed, she has  4 grown children.  She has a call alert button.  She is a non-smoker and does not drink alcohol and does not drink caffeine on a daily basis.  Her latest A1c was either 8 or 9 or 11 per her recollection.  She had blood work through her primary care physician in October.  I reviewed your office note from 07/06/2019.  She  denies any recurrent headaches or vertiginous symptoms such as spinning sensation or nausea.  She denies any sudden onset of one-sided weakness or numbness or tingling or droopy face or slurring of speech.  She has had intermittent tingling sensation in her feet and left hand.   Her Past Medical History Is Significant For: Past Medical History:  Diagnosis Date  . Diabetes mellitus without complication ([place])   . High cholesterol     Her Past Surgical History Is Significant For: Past Surgical History:  Procedure Laterality Date  . ABDOMINAL HYSTERECTOMY    . APPENDECTOMY    . BACK SURGERY    . CERVICAL DISC SURGERY    . HAND SURGERY    . KNEE SURGERY      Her Family History Is Significant For: Family History  Problem Relation Age of Onset  . Cardiomyopathy Father   . Cancer Mother     Her Social History Is Significant For: Social History   Socioeconomic History  . Marital status: Widowed    Spouse name: Not on file  . Number of children: Not on file  . Years of education: Not on file  . Highest education level: Not on file  Occupational History  . Not on file  Tobacco Use  . Smoking status: Never Smoker  . Smokeless tobacco: Never Used  Substance and Sexual Activity  .  Alcohol use: Never  . Drug use: Not on file  . Sexual activity: Never  Other Topics Concern  . Not on file  Social History Narrative  . Not on file   Social Determinants of Health   Financial Resource Strain:   . Difficulty of Paying Living Expenses: Not on file  Food Insecurity:   . Worried About Charity fundraiser in the Last Year: Not on file  . Ran Out of Food in the Last Year: Not on file  Transportation Needs:   . Lack of Transportation (Medical): Not on file  . Lack of Transportation (Non-Medical): Not on file  Physical Activity:   . Days of Exercise per Week: Not on file  . Minutes of Exercise per Session: Not on file  Stress:   . Feeling of Stress : Not on file  Social  Connections:   . Frequency of Communication with Friends and Family: Not on file  . Frequency of Social Gatherings with Friends and Family: Not on file  . Attends Religious Services: Not on file  . Active Member of Clubs or Organizations: Not on file  . Attends Archivist Meetings: Not on file  . Marital Status: Not on file    Her Allergies Are:  Allergies  Allergen Reactions  . Codeine   :   Her Current Medications Are:  Outpatient Encounter Medications as of 08/17/2019  Medication Sig  . atorvastatin (LIPITOR) 20 MG tablet Take 20 mg by mouth daily.  . chlorthalidone (HYGROTON) 50 MG tablet Take by mouth daily.  . metFORMIN (GLUCOPHAGE) 500 MG tablet Take 500 mg by mouth 2 (two) times daily with a meal.  . Multiple Vitamin (MULTIVITAMIN WITH MINERALS) TABS tablet Take 1 tablet by mouth daily.  . pantoprazole (PROTONIX) 40 MG tablet Take 40 mg by mouth daily.  . psyllium (REGULOID) 0.52 g capsule Take 0.52 g by mouth daily.  . SUPER B COMPLEX/C PO Take 1 each by mouth.  . [DISCONTINUED] glimepiride (AMARYL) 4 MG tablet Take 4 mg by mouth daily with breakfast.  . [DISCONTINUED] saxagliptin HCl (ONGLYZA) 2.5 MG TABS tablet Take 5 mg by mouth daily.   No facility-administered encounter medications on file as of 08/17/2019.  :   Review of Systems:  Out of a complete 14 point review of systems, all are reviewed and negative with the exception of these symptoms as listed below:  Review of Systems  Neurological:       Pt presents today to discuss her imbalance. Pt reports that she has fallen twice recently and hit her head. She did not seek emergency attention.    Objective:  Neurological Exam  Physical Exam Physical Examination:   Vitals:   08/17/19 1410  BP: 128/74  Pulse: 89   On orthostatic testing, lying blood pressure and pulse 127/75 with a pulse of 97, sitting 121/75 with a pulse of 92, standing 131/74 with a pulse of 97.She denies any orthostatic  lightheadedness and vertiginous symptoms.  General Examination: The patient is a very pleasant 83 y.o. female in no acute distress. She appears well-developed and well-nourished and well groomed.   HEENT: Normocephalic, atraumatic, pupils are equal, round and reactive to light and accommodation. Corrective eyeglasses in place. Extraocular tracking is good without limitation to gaze excursion or nystagmus noted. Normal smooth pursuit is noted. Hearing is grossly intact. Tympanic membranes are clear bilaterally. Face is symmetric with normal facial animation and normal facial sensation. Speech is clear with no dysarthria noted.  There is no hypophonia. There is no lip, neck/head, jaw or voice tremor. Neck is supple with full range of passive and active motion. There are no carotid bruits on auscultation. Oropharynx exam reveals: mild mouth dryness, adequate dental hygiene, With full dentures in place, tongue protrudes centrally in palate elevates symmetrically.  Chest: Clear to auscultation without wheezing, rhonchi or crackles noted.  Heart: S1+S2+0, regular and normal without murmurs, rubs or gallops noted.   Abdomen: Soft, non-tender and non-distended with normal bowel sounds appreciated on auscultation.  Extremities: There is no pitting edema in the distal lower extremities bilaterally. Pedal pulses are intact.  Skin: Warm and dry without trophic changes noted.  Musculoskeletal: exam reveals Arthritic changes in both hands, she a scar over the left elbow.  She has an unremarkable left anterior neck scar.  She reports left-sided low back pain without radiation. She has decreased range of motion in the left elbow.  Neurologically:  Mental status: The patient is awake, alert and oriented in all 4 spheres. Her immediate and remote memory, attention, language skills and fund of knowledge are appropriate. There is no evidence of aphasia, agnosia, apraxia or anomia. Speech is clear with normal prosody  and enunciation. Thought process is linear. Mood is normal and affect is normal.  Cranial nerves II - XII are as described above under HEENT exam. In addition: shoulder shrug is normal with equal shoulder height noted. Motor exam: Normal bulk, strength and tone is noted. There is no drift, tremor or rebound. Romberg is not Tested due to safety concerns.  Reflexes are 1+ in the upper extremities, 2+ in the right knee, 1+ in the left knee, trace in the ankles. Toes are flexor bilaterally. Fine motor skills and coordination: intact with normal finger taps, normal hand movements, normal rapid alternating patting, normal foot taps and normal foot agility.  Cerebellar testing: No dysmetria or intention tremor on finger to nose testing. Heel to shin is unremarkable bilaterally. There is no truncal or gait ataxia.  Sensory exam: intact to light touch, but Slight decrease in pinprick sensation in the lower extremities distally. Gait, station and balance: She stands with difficulty, Stands slightly wide-based, she did not bring a cane or walker.  She walks slightly cautiously and slowly, no shuffling, preserved arm swing, reports some discomfort in the left lower back but no radiating pain, no limp.  Assessment and Plan:   In summary, BARRY CULVERHOUSE is a very pleasant 83 y.o.-year old female with an underlying met history of hypertension, diabetes, degenerative spine disease with status post surgery to the lower back (Dr. Christella Noa) and neck (Dr. Rolena Infante), status post knee surgery, L elbow fracture, and borderline obesity, who Presents for evaluation of her balance problems. She reports a several year history of balance problem and gait disorder.  Her examination reveals a nonspecific gait disturbance, no telltale signs of parkinsonism, history and exam also not typical for vertigo, no significant orthostatic symptoms or hypotension noted.  I believe her gait and balance disturbance are multifactorial in nature  secondary to his spine related arthritis, residual low back pain, possible diabetic neuropathy and history of suboptimally controlled diabetes. She is advised to consider using a cane at least or a walker.  She has both available.  She typically stays well-hydrated and is encouraged to Use caution when she stands up, to avoid standing up too quickly.  She is advised to proceed with some blood tests today and we will call her with those results.  In  addition, she reports a recent fall with hitting her head backwards.  She did not lose any consciousness, reports no injury in that moment, no residual symptoms or headaches, nevertheless, I would like to proceed with a brain MRI without contrast.  We will keep her posted as to her results by phone call and see her in follow-up if needed.  I answered all their questions today and the patient and her granddaughter were in agreement.  Thank you very much for allowing me to participate in the care of this nice patient. If I can be of any further assistance to you please do not hesitate to call me at [phone].  Sincerely,   Star Age, MD, PhD

## 2019-08-17 NOTE — Patient Instructions (Signed)
I believe you have a multifactorial gait disorder, meaning, that it is [name] is due to a combination of factors. These factors include: normal aging, degenerative arthritis of your back, and possible atherosclerosis of the blood vessels in your brain, diabetes related changes in the nerves.   Please remember to stand up slowly and get your bearings first turn slowly, no bending down to pick anything, no heavy lifting, be extra careful at night and first thing in the morning. Also, be careful in the Bathroom and the kitchen.   Remember to drink plenty of fluid, eat healthy meals and do not skip any meals. Try to eat protein with a every meal and eat a healthy snack such as fruit or nuts or yogurt in between meals. Try to keep a regular sleep-wake schedule and try to exercise daily, particularly in the form of walking, if you can. You should consider using a cane or your walker for safety.   As far as your medications are concerned, I would like to suggest no new medications.   As far as diagnostic testing: I suggest we proceed with a brain scan, called MRI. We will call you with the test results. We will have to schedule you for this on a separate date. This test requires authorization from your insurance, and we will take care of the insurance process. We will check blood work today and call you with the test results.  For now, we will keep you posted as to your test results and follow-up if needed.

## 2019-08-18 LAB — COMPREHENSIVE METABOLIC PANEL
ALT: 16 IU/L (ref 0–32)
AST: 21 IU/L (ref 0–40)
Albumin/Globulin Ratio: 1.7 (ref 1.2–2.2)
Albumin: 4.3 g/dL (ref 3.6–4.6)
Alkaline Phosphatase: 84 IU/L (ref 39–117)
BUN/Creatinine Ratio: 25 (ref 12–28)
BUN: 30 mg/dL — ABNORMAL HIGH (ref 8–27)
Bilirubin Total: 0.2 mg/dL (ref 0.0–1.2)
CO2: 21 mmol/L (ref 20–29)
Calcium: 9.5 mg/dL (ref 8.7–10.3)
Chloride: 97 mmol/L (ref 96–106)
Creatinine, Ser: 1.22 mg/dL — ABNORMAL HIGH (ref 0.57–1.00)
GFR calc Af Amer: 48 mL/min/{1.73_m2} — ABNORMAL LOW (ref >59)
GFR calc non Af Amer: 42 mL/min/{1.73_m2} — ABNORMAL LOW (ref >59)
Globulin, Total: 2.5 g/dL (ref 1.5–4.5)
Glucose: 377 mg/dL — ABNORMAL HIGH (ref 65–99)
Potassium: 4.2 mmol/L (ref 3.5–5.2)
Sodium: 136 mmol/L (ref 134–144)
Total Protein: 6.8 g/dL (ref 6.0–8.5)

## 2019-08-18 LAB — HGB A1C W/O EAG: Hgb A1c MFr Bld: 10 % — ABNORMAL HIGH (ref 4.8–5.6)

## 2019-08-18 LAB — B12 AND FOLATE PANEL
Folate: 17.4 ng/mL (ref >3.0)
Vitamin B-12: 948 pg/mL (ref 232–1245)

## 2019-08-18 LAB — TSH: TSH: 2.83 u[IU]/mL (ref 0.450–4.500)

## 2019-08-20 DIAGNOSIS — S199XXA Unspecified injury of neck, initial encounter: Secondary | ICD-10-CM | POA: Diagnosis not present

## 2019-08-20 DIAGNOSIS — M542 Cervicalgia: Secondary | ICD-10-CM | POA: Diagnosis not present

## 2019-08-20 DIAGNOSIS — R52 Pain, unspecified: Secondary | ICD-10-CM | POA: Diagnosis not present

## 2019-08-20 DIAGNOSIS — R519 Headache, unspecified: Secondary | ICD-10-CM | POA: Diagnosis not present

## 2019-08-20 DIAGNOSIS — E1165 Type 2 diabetes mellitus with hyperglycemia: Secondary | ICD-10-CM | POA: Diagnosis not present

## 2019-08-20 DIAGNOSIS — M25519 Pain in unspecified shoulder: Secondary | ICD-10-CM | POA: Diagnosis not present

## 2019-08-20 DIAGNOSIS — R55 Syncope and collapse: Secondary | ICD-10-CM | POA: Diagnosis not present

## 2019-08-20 DIAGNOSIS — E119 Type 2 diabetes mellitus without complications: Secondary | ICD-10-CM | POA: Diagnosis not present

## 2019-08-20 DIAGNOSIS — M25511 Pain in right shoulder: Secondary | ICD-10-CM | POA: Diagnosis not present

## 2019-08-20 DIAGNOSIS — S299XXA Unspecified injury of thorax, initial encounter: Secondary | ICD-10-CM | POA: Diagnosis not present

## 2019-08-20 DIAGNOSIS — W01198A Fall on same level from slipping, tripping and stumbling with subsequent striking against other object, initial encounter: Secondary | ICD-10-CM | POA: Diagnosis not present

## 2019-08-20 DIAGNOSIS — S0003XA Contusion of scalp, initial encounter: Secondary | ICD-10-CM | POA: Diagnosis not present

## 2019-08-20 DIAGNOSIS — R42 Dizziness and giddiness: Secondary | ICD-10-CM | POA: Diagnosis not present

## 2019-08-20 DIAGNOSIS — S0990XA Unspecified injury of head, initial encounter: Secondary | ICD-10-CM | POA: Diagnosis not present

## 2019-08-20 DIAGNOSIS — R531 Weakness: Secondary | ICD-10-CM | POA: Diagnosis not present

## 2019-08-20 DIAGNOSIS — S161XXA Strain of muscle, fascia and tendon at neck level, initial encounter: Secondary | ICD-10-CM | POA: Diagnosis not present

## 2019-08-21 ENCOUNTER — Telehealth: Payer: Self-pay | Admitting: Neurology

## 2019-08-21 DIAGNOSIS — R2681 Unsteadiness on feet: Secondary | ICD-10-CM | POA: Diagnosis not present

## 2019-08-21 DIAGNOSIS — R296 Repeated falls: Secondary | ICD-10-CM | POA: Diagnosis not present

## 2019-08-21 DIAGNOSIS — R5383 Other fatigue: Secondary | ICD-10-CM | POA: Diagnosis not present

## 2019-08-21 DIAGNOSIS — E1122 Type 2 diabetes mellitus with diabetic chronic kidney disease: Secondary | ICD-10-CM | POA: Diagnosis not present

## 2019-08-21 DIAGNOSIS — Z6830 Body mass index (BMI) 30.0-30.9, adult: Secondary | ICD-10-CM | POA: Diagnosis not present

## 2019-08-21 DIAGNOSIS — M5412 Radiculopathy, cervical region: Secondary | ICD-10-CM | POA: Diagnosis not present

## 2019-08-21 DIAGNOSIS — G542 Cervical root disorders, not elsewhere classified: Secondary | ICD-10-CM | POA: Diagnosis not present

## 2019-08-21 DIAGNOSIS — M4712 Other spondylosis with myelopathy, cervical region: Secondary | ICD-10-CM | POA: Diagnosis not present

## 2019-08-21 NOTE — Progress Notes (Signed)
Please advise patient that her hemoglobin A1c was indeed high at 10.0.  Her blood sugar level was also high at 377, mild impairment in kidney function was also noted, possibly in part related to not drinking enough water.  Please advise patient to follow-up closely with her primary care physician to discuss diabetes management or if she has a diabetes specialist called endocrinologist she needs to follow-up with her specialist as soon as possible.

## 2019-08-21 NOTE — Telephone Encounter (Signed)
Noted  

## 2019-08-21 NOTE — Telephone Encounter (Signed)
FYI- Pts granddaughter Susanne Greenhouse called in and stated , pt fell yesterday and hit her head , she states her legs gave out on her. She was transported to [facility] and a CT Scan of her head was done.

## 2019-09-01 DIAGNOSIS — R55 Syncope and collapse: Secondary | ICD-10-CM | POA: Diagnosis not present

## 2019-09-01 DIAGNOSIS — R110 Nausea: Secondary | ICD-10-CM | POA: Diagnosis not present

## 2019-09-01 DIAGNOSIS — R109 Unspecified abdominal pain: Secondary | ICD-10-CM | POA: Diagnosis not present

## 2019-09-01 DIAGNOSIS — R6883 Chills (without fever): Secondary | ICD-10-CM | POA: Diagnosis not present

## 2019-09-01 DIAGNOSIS — R531 Weakness: Secondary | ICD-10-CM | POA: Diagnosis not present

## 2019-09-01 DIAGNOSIS — B349 Viral infection, unspecified: Secondary | ICD-10-CM | POA: Diagnosis not present

## 2019-09-02 DIAGNOSIS — R109 Unspecified abdominal pain: Secondary | ICD-10-CM | POA: Diagnosis not present

## 2019-09-02 DIAGNOSIS — R6883 Chills (without fever): Secondary | ICD-10-CM | POA: Diagnosis not present

## 2019-09-02 DIAGNOSIS — R110 Nausea: Secondary | ICD-10-CM | POA: Diagnosis not present

## 2019-09-08 ENCOUNTER — Ambulatory Visit
Admission: RE | Admit: 2019-09-08 | Discharge: 2019-09-08 | Disposition: A | Discharge status: Home / Self Care | Payer: Medicare PPO | Source: Ambulatory Visit | Attending: Neurology | Admitting: Neurology

## 2019-09-08 ENCOUNTER — Other Ambulatory Visit: Payer: Self-pay

## 2019-09-08 DIAGNOSIS — R2689 Other abnormalities of gait and mobility: Secondary | ICD-10-CM | POA: Diagnosis not present

## 2019-09-08 DIAGNOSIS — Z9181 History of falling: Secondary | ICD-10-CM

## 2019-09-11 ENCOUNTER — Telehealth: Payer: Self-pay

## 2019-09-11 NOTE — Telephone Encounter (Signed)
Pt returning call please call back °

## 2019-09-11 NOTE — Progress Notes (Signed)
Brain MRI wo contrast showed no acute findings, some fluid in the mastoid air cells, which means she could have chronic inner ear problems. If she has chronic fullness or muffled hearing or chronic sinus congestion, she may benefit from seeing ENT; she can d/w PCP. Please update pt.  Michel Bickers

## 2019-09-11 NOTE — Telephone Encounter (Signed)
I was able to reach back out to the pt and advised of results. Pt verbalized understanding but requested medical records send a written report of the MRI to her home address.  I advised the pt I would send a message to medical records asking for this information to be sent. Pt was agreeable.

## 2019-09-11 NOTE — Telephone Encounter (Signed)
Star Age, MD  09/11/2019 7:50 AM EST    Brain MRI wo contrast showed no acute findings, some fluid in the mastoid air cells, which means she could have chronic inner ear problems. If she has chronic fullness or muffled hearing or chronic sinus congestion, she may benefit from seeing ENT; she can d/w PCP. Please update pt.  sa    I attempted to reach the pt and leave a vm. Pt was not available and vm was full at this time. Will try again later.

## 2019-09-29 DIAGNOSIS — I10 Essential (primary) hypertension: Secondary | ICD-10-CM | POA: Diagnosis not present

## 2019-09-29 DIAGNOSIS — E1122 Type 2 diabetes mellitus with diabetic chronic kidney disease: Secondary | ICD-10-CM | POA: Diagnosis not present

## 2019-10-05 DIAGNOSIS — K219 Gastro-esophageal reflux disease without esophagitis: Secondary | ICD-10-CM | POA: Diagnosis not present

## 2019-10-05 DIAGNOSIS — E782 Mixed hyperlipidemia: Secondary | ICD-10-CM | POA: Diagnosis not present

## 2019-10-05 DIAGNOSIS — E1165 Type 2 diabetes mellitus with hyperglycemia: Secondary | ICD-10-CM | POA: Diagnosis not present

## 2019-10-05 DIAGNOSIS — N182 Chronic kidney disease, stage 2 (mild): Secondary | ICD-10-CM | POA: Diagnosis not present

## 2019-10-05 DIAGNOSIS — I10 Essential (primary) hypertension: Secondary | ICD-10-CM | POA: Diagnosis not present

## 2019-10-05 DIAGNOSIS — E1122 Type 2 diabetes mellitus with diabetic chronic kidney disease: Secondary | ICD-10-CM | POA: Diagnosis not present

## 2019-10-05 DIAGNOSIS — R5383 Other fatigue: Secondary | ICD-10-CM | POA: Diagnosis not present

## 2019-10-10 DIAGNOSIS — M5416 Radiculopathy, lumbar region: Secondary | ICD-10-CM | POA: Diagnosis not present

## 2019-10-10 DIAGNOSIS — Z0001 Encounter for general adult medical examination with abnormal findings: Secondary | ICD-10-CM | POA: Diagnosis not present

## 2019-10-10 DIAGNOSIS — I10 Essential (primary) hypertension: Secondary | ICD-10-CM | POA: Diagnosis not present

## 2019-10-10 DIAGNOSIS — M7501 Adhesive capsulitis of right shoulder: Secondary | ICD-10-CM | POA: Diagnosis not present

## 2019-10-10 DIAGNOSIS — Z6831 Body mass index (BMI) 31.0-31.9, adult: Secondary | ICD-10-CM | POA: Diagnosis not present

## 2019-10-10 DIAGNOSIS — E782 Mixed hyperlipidemia: Secondary | ICD-10-CM | POA: Diagnosis not present

## 2019-10-10 DIAGNOSIS — K5730 Diverticulosis of large intestine without perforation or abscess without bleeding: Secondary | ICD-10-CM | POA: Diagnosis not present

## 2019-10-10 DIAGNOSIS — R296 Repeated falls: Secondary | ICD-10-CM | POA: Diagnosis not present

## 2019-10-10 DIAGNOSIS — M4712 Other spondylosis with myelopathy, cervical region: Secondary | ICD-10-CM | POA: Diagnosis not present

## 2019-10-10 DIAGNOSIS — M47812 Spondylosis without myelopathy or radiculopathy, cervical region: Secondary | ICD-10-CM | POA: Diagnosis not present

## 2019-10-10 DIAGNOSIS — E1122 Type 2 diabetes mellitus with diabetic chronic kidney disease: Secondary | ICD-10-CM | POA: Diagnosis not present

## 2019-10-10 DIAGNOSIS — N182 Chronic kidney disease, stage 2 (mild): Secondary | ICD-10-CM | POA: Diagnosis not present

## 2019-10-27 DIAGNOSIS — E1122 Type 2 diabetes mellitus with diabetic chronic kidney disease: Secondary | ICD-10-CM | POA: Diagnosis not present

## 2019-10-27 DIAGNOSIS — E7849 Other hyperlipidemia: Secondary | ICD-10-CM | POA: Diagnosis not present

## 2019-10-27 DIAGNOSIS — N182 Chronic kidney disease, stage 2 (mild): Secondary | ICD-10-CM | POA: Diagnosis not present

## 2019-10-27 DIAGNOSIS — I129 Hypertensive chronic kidney disease with stage 1 through stage 4 chronic kidney disease, or unspecified chronic kidney disease: Secondary | ICD-10-CM | POA: Diagnosis not present

## 2019-11-23 DIAGNOSIS — M4712 Other spondylosis with myelopathy, cervical region: Secondary | ICD-10-CM | POA: Diagnosis not present

## 2019-11-23 DIAGNOSIS — Z6831 Body mass index (BMI) 31.0-31.9, adult: Secondary | ICD-10-CM | POA: Diagnosis not present

## 2019-11-23 DIAGNOSIS — E1140 Type 2 diabetes mellitus with diabetic neuropathy, unspecified: Secondary | ICD-10-CM | POA: Diagnosis not present

## 2019-11-23 DIAGNOSIS — E1122 Type 2 diabetes mellitus with diabetic chronic kidney disease: Secondary | ICD-10-CM | POA: Diagnosis not present

## 2019-11-23 DIAGNOSIS — R296 Repeated falls: Secondary | ICD-10-CM | POA: Diagnosis not present

## 2019-11-29 DIAGNOSIS — E7849 Other hyperlipidemia: Secondary | ICD-10-CM | POA: Diagnosis not present

## 2019-11-29 DIAGNOSIS — E1122 Type 2 diabetes mellitus with diabetic chronic kidney disease: Secondary | ICD-10-CM | POA: Diagnosis not present

## 2019-11-29 DIAGNOSIS — N182 Chronic kidney disease, stage 2 (mild): Secondary | ICD-10-CM | POA: Diagnosis not present

## 2019-11-29 DIAGNOSIS — I129 Hypertensive chronic kidney disease with stage 1 through stage 4 chronic kidney disease, or unspecified chronic kidney disease: Secondary | ICD-10-CM | POA: Diagnosis not present

## 2019-12-26 ENCOUNTER — Ambulatory Visit: Payer: Medicare PPO | Admitting: Neurology

## 2020-01-02 ENCOUNTER — Ambulatory Visit: Payer: Medicare PPO | Admitting: Neurology

## 2020-01-02 ENCOUNTER — Telehealth: Payer: Self-pay

## 2020-01-02 NOTE — Telephone Encounter (Signed)
Pt canceled in less than 24 hours for her 01/02/2020 appt with Dr. Rexene Alberts.

## 2020-01-22 DIAGNOSIS — E782 Mixed hyperlipidemia: Secondary | ICD-10-CM | POA: Diagnosis not present

## 2020-01-22 DIAGNOSIS — E1165 Type 2 diabetes mellitus with hyperglycemia: Secondary | ICD-10-CM | POA: Diagnosis not present

## 2020-01-22 DIAGNOSIS — R5383 Other fatigue: Secondary | ICD-10-CM | POA: Diagnosis not present

## 2020-01-22 DIAGNOSIS — N182 Chronic kidney disease, stage 2 (mild): Secondary | ICD-10-CM | POA: Diagnosis not present

## 2020-01-22 DIAGNOSIS — E1122 Type 2 diabetes mellitus with diabetic chronic kidney disease: Secondary | ICD-10-CM | POA: Diagnosis not present

## 2020-01-22 DIAGNOSIS — I10 Essential (primary) hypertension: Secondary | ICD-10-CM | POA: Diagnosis not present

## 2020-01-22 DIAGNOSIS — K219 Gastro-esophageal reflux disease without esophagitis: Secondary | ICD-10-CM | POA: Diagnosis not present

## 2020-01-30 ENCOUNTER — Ambulatory Visit: Payer: Medicare HMO | Admitting: Neurology

## 2020-01-30 ENCOUNTER — Telehealth: Payer: Self-pay

## 2020-01-30 ENCOUNTER — Encounter: Payer: Self-pay | Admitting: Neurology

## 2020-01-30 NOTE — Telephone Encounter (Signed)
Pt no showed 01/30/2020 f/u appt with Dr. Rexene Alberts.

## 2020-02-01 DIAGNOSIS — Z6830 Body mass index (BMI) 30.0-30.9, adult: Secondary | ICD-10-CM | POA: Diagnosis not present

## 2020-02-01 DIAGNOSIS — R2681 Unsteadiness on feet: Secondary | ICD-10-CM | POA: Diagnosis not present

## 2020-02-01 DIAGNOSIS — M4712 Other spondylosis with myelopathy, cervical region: Secondary | ICD-10-CM | POA: Diagnosis not present

## 2020-02-01 DIAGNOSIS — E11618 Type 2 diabetes mellitus with other diabetic arthropathy: Secondary | ICD-10-CM | POA: Diagnosis not present

## 2020-02-01 DIAGNOSIS — E1140 Type 2 diabetes mellitus with diabetic neuropathy, unspecified: Secondary | ICD-10-CM | POA: Diagnosis not present

## 2020-02-01 DIAGNOSIS — E782 Mixed hyperlipidemia: Secondary | ICD-10-CM | POA: Diagnosis not present

## 2020-02-01 DIAGNOSIS — I10 Essential (primary) hypertension: Secondary | ICD-10-CM | POA: Diagnosis not present

## 2020-02-01 DIAGNOSIS — E1122 Type 2 diabetes mellitus with diabetic chronic kidney disease: Secondary | ICD-10-CM | POA: Diagnosis not present

## 2020-02-22 DIAGNOSIS — R42 Dizziness and giddiness: Secondary | ICD-10-CM | POA: Diagnosis not present

## 2020-02-22 DIAGNOSIS — E119 Type 2 diabetes mellitus without complications: Secondary | ICD-10-CM | POA: Diagnosis not present

## 2020-02-22 DIAGNOSIS — J9811 Atelectasis: Secondary | ICD-10-CM | POA: Diagnosis not present

## 2020-02-22 DIAGNOSIS — H9319 Tinnitus, unspecified ear: Secondary | ICD-10-CM | POA: Diagnosis not present

## 2020-02-22 DIAGNOSIS — R531 Weakness: Secondary | ICD-10-CM | POA: Diagnosis not present

## 2020-02-22 DIAGNOSIS — F17200 Nicotine dependence, unspecified, uncomplicated: Secondary | ICD-10-CM | POA: Diagnosis not present

## 2020-04-30 DIAGNOSIS — R5383 Other fatigue: Secondary | ICD-10-CM | POA: Diagnosis not present

## 2020-04-30 DIAGNOSIS — E1122 Type 2 diabetes mellitus with diabetic chronic kidney disease: Secondary | ICD-10-CM | POA: Diagnosis not present

## 2020-04-30 DIAGNOSIS — M19072 Primary osteoarthritis, left ankle and foot: Secondary | ICD-10-CM | POA: Diagnosis not present

## 2020-04-30 DIAGNOSIS — M4712 Other spondylosis with myelopathy, cervical region: Secondary | ICD-10-CM | POA: Diagnosis not present

## 2020-04-30 DIAGNOSIS — K219 Gastro-esophageal reflux disease without esophagitis: Secondary | ICD-10-CM | POA: Diagnosis not present

## 2020-04-30 DIAGNOSIS — R296 Repeated falls: Secondary | ICD-10-CM | POA: Diagnosis not present

## 2020-04-30 DIAGNOSIS — N182 Chronic kidney disease, stage 2 (mild): Secondary | ICD-10-CM | POA: Diagnosis not present

## 2020-04-30 DIAGNOSIS — Z6830 Body mass index (BMI) 30.0-30.9, adult: Secondary | ICD-10-CM | POA: Diagnosis not present

## 2020-04-30 DIAGNOSIS — E782 Mixed hyperlipidemia: Secondary | ICD-10-CM | POA: Diagnosis not present

## 2020-04-30 DIAGNOSIS — G542 Cervical root disorders, not elsewhere classified: Secondary | ICD-10-CM | POA: Diagnosis not present

## 2020-04-30 DIAGNOSIS — I10 Essential (primary) hypertension: Secondary | ICD-10-CM | POA: Diagnosis not present

## 2020-04-30 DIAGNOSIS — E1165 Type 2 diabetes mellitus with hyperglycemia: Secondary | ICD-10-CM | POA: Diagnosis not present

## 2020-05-02 DIAGNOSIS — H903 Sensorineural hearing loss, bilateral: Secondary | ICD-10-CM | POA: Diagnosis not present

## 2020-05-02 DIAGNOSIS — R2689 Other abnormalities of gait and mobility: Secondary | ICD-10-CM | POA: Diagnosis not present

## 2020-05-02 DIAGNOSIS — H9201 Otalgia, right ear: Secondary | ICD-10-CM | POA: Diagnosis not present

## 2020-05-08 ENCOUNTER — Ambulatory Visit: Payer: Medicare HMO | Admitting: Podiatry

## 2020-05-16 ENCOUNTER — Ambulatory Visit: Payer: Medicare HMO | Admitting: Podiatry

## 2020-05-16 DIAGNOSIS — M79672 Pain in left foot: Secondary | ICD-10-CM | POA: Diagnosis not present

## 2020-05-16 DIAGNOSIS — M722 Plantar fascial fibromatosis: Secondary | ICD-10-CM | POA: Diagnosis not present

## 2020-05-28 DIAGNOSIS — E1122 Type 2 diabetes mellitus with diabetic chronic kidney disease: Secondary | ICD-10-CM | POA: Diagnosis not present

## 2020-06-05 DIAGNOSIS — I10 Essential (primary) hypertension: Secondary | ICD-10-CM | POA: Diagnosis not present

## 2020-06-05 DIAGNOSIS — E1140 Type 2 diabetes mellitus with diabetic neuropathy, unspecified: Secondary | ICD-10-CM | POA: Diagnosis not present

## 2020-06-05 DIAGNOSIS — M4712 Other spondylosis with myelopathy, cervical region: Secondary | ICD-10-CM | POA: Diagnosis not present

## 2020-06-05 DIAGNOSIS — Z6830 Body mass index (BMI) 30.0-30.9, adult: Secondary | ICD-10-CM | POA: Diagnosis not present

## 2020-06-05 DIAGNOSIS — E1122 Type 2 diabetes mellitus with diabetic chronic kidney disease: Secondary | ICD-10-CM | POA: Diagnosis not present

## 2020-06-05 DIAGNOSIS — Z23 Encounter for immunization: Secondary | ICD-10-CM | POA: Diagnosis not present

## 2020-06-10 DIAGNOSIS — M542 Cervicalgia: Secondary | ICD-10-CM | POA: Diagnosis not present

## 2020-06-10 DIAGNOSIS — G5603 Carpal tunnel syndrome, bilateral upper limbs: Secondary | ICD-10-CM | POA: Diagnosis not present

## 2020-06-10 DIAGNOSIS — G894 Chronic pain syndrome: Secondary | ICD-10-CM | POA: Diagnosis not present

## 2020-06-10 DIAGNOSIS — E1142 Type 2 diabetes mellitus with diabetic polyneuropathy: Secondary | ICD-10-CM | POA: Diagnosis not present

## 2020-06-10 DIAGNOSIS — I951 Orthostatic hypotension: Secondary | ICD-10-CM | POA: Diagnosis not present

## 2020-06-10 DIAGNOSIS — R296 Repeated falls: Secondary | ICD-10-CM | POA: Diagnosis not present

## 2020-07-09 DIAGNOSIS — I951 Orthostatic hypotension: Secondary | ICD-10-CM | POA: Diagnosis not present

## 2020-07-09 DIAGNOSIS — G5603 Carpal tunnel syndrome, bilateral upper limbs: Secondary | ICD-10-CM | POA: Diagnosis not present

## 2020-07-09 DIAGNOSIS — R296 Repeated falls: Secondary | ICD-10-CM | POA: Diagnosis not present

## 2020-07-09 DIAGNOSIS — G894 Chronic pain syndrome: Secondary | ICD-10-CM | POA: Diagnosis not present

## 2020-07-09 DIAGNOSIS — M542 Cervicalgia: Secondary | ICD-10-CM | POA: Diagnosis not present

## 2020-07-09 DIAGNOSIS — E1142 Type 2 diabetes mellitus with diabetic polyneuropathy: Secondary | ICD-10-CM | POA: Diagnosis not present

## 2020-09-23 DIAGNOSIS — E7849 Other hyperlipidemia: Secondary | ICD-10-CM | POA: Diagnosis not present

## 2020-09-23 DIAGNOSIS — I10 Essential (primary) hypertension: Secondary | ICD-10-CM | POA: Diagnosis not present

## 2020-09-23 DIAGNOSIS — E1165 Type 2 diabetes mellitus with hyperglycemia: Secondary | ICD-10-CM | POA: Diagnosis not present

## 2020-09-23 DIAGNOSIS — E782 Mixed hyperlipidemia: Secondary | ICD-10-CM | POA: Diagnosis not present

## 2020-09-23 DIAGNOSIS — E1122 Type 2 diabetes mellitus with diabetic chronic kidney disease: Secondary | ICD-10-CM | POA: Diagnosis not present

## 2020-09-23 DIAGNOSIS — K219 Gastro-esophageal reflux disease without esophagitis: Secondary | ICD-10-CM | POA: Diagnosis not present

## 2020-09-30 DIAGNOSIS — Z6830 Body mass index (BMI) 30.0-30.9, adult: Secondary | ICD-10-CM | POA: Diagnosis not present

## 2020-09-30 DIAGNOSIS — E7849 Other hyperlipidemia: Secondary | ICD-10-CM | POA: Diagnosis not present

## 2020-09-30 DIAGNOSIS — M19072 Primary osteoarthritis, left ankle and foot: Secondary | ICD-10-CM | POA: Diagnosis not present

## 2020-09-30 DIAGNOSIS — I10 Essential (primary) hypertension: Secondary | ICD-10-CM | POA: Diagnosis not present

## 2020-09-30 DIAGNOSIS — Z23 Encounter for immunization: Secondary | ICD-10-CM | POA: Diagnosis not present

## 2020-09-30 DIAGNOSIS — E1122 Type 2 diabetes mellitus with diabetic chronic kidney disease: Secondary | ICD-10-CM | POA: Diagnosis not present

## 2020-09-30 DIAGNOSIS — M4712 Other spondylosis with myelopathy, cervical region: Secondary | ICD-10-CM | POA: Diagnosis not present

## 2020-09-30 DIAGNOSIS — E1140 Type 2 diabetes mellitus with diabetic neuropathy, unspecified: Secondary | ICD-10-CM | POA: Diagnosis not present

## 2020-10-30 DIAGNOSIS — R059 Cough, unspecified: Secondary | ICD-10-CM | POA: Diagnosis not present

## 2020-10-30 DIAGNOSIS — J209 Acute bronchitis, unspecified: Secondary | ICD-10-CM | POA: Diagnosis not present

## 2020-11-19 ENCOUNTER — Emergency Department (HOSPITAL_COMMUNITY): Payer: Medicare HMO

## 2020-11-19 ENCOUNTER — Encounter (HOSPITAL_COMMUNITY): Payer: Self-pay | Admitting: Emergency Medicine

## 2020-11-19 ENCOUNTER — Other Ambulatory Visit: Payer: Self-pay

## 2020-11-19 ENCOUNTER — Emergency Department (HOSPITAL_COMMUNITY)
Admission: EM | Admit: 2020-11-19 | Discharge: 2020-11-19 | Disposition: A | Discharge status: Home / Self Care | Payer: Medicare HMO | Attending: Emergency Medicine | Admitting: Emergency Medicine

## 2020-11-19 DIAGNOSIS — Y92000 Kitchen of unspecified non-institutional (private) residence as  the place of occurrence of the external cause: Secondary | ICD-10-CM | POA: Diagnosis not present

## 2020-11-19 DIAGNOSIS — W19XXXA Unspecified fall, initial encounter: Secondary | ICD-10-CM | POA: Insufficient documentation

## 2020-11-19 DIAGNOSIS — E1165 Type 2 diabetes mellitus with hyperglycemia: Secondary | ICD-10-CM | POA: Diagnosis not present

## 2020-11-19 DIAGNOSIS — M25511 Pain in right shoulder: Secondary | ICD-10-CM | POA: Diagnosis not present

## 2020-11-19 DIAGNOSIS — M79605 Pain in left leg: Secondary | ICD-10-CM | POA: Diagnosis not present

## 2020-11-19 DIAGNOSIS — M25519 Pain in unspecified shoulder: Secondary | ICD-10-CM | POA: Diagnosis not present

## 2020-11-19 DIAGNOSIS — Z5321 Procedure and treatment not carried out due to patient leaving prior to being seen by health care provider: Secondary | ICD-10-CM | POA: Diagnosis not present

## 2020-11-19 HISTORY — DX: Polyneuropathy, unspecified: G62.9

## 2020-11-19 HISTORY — DX: Cervicalgia: M54.2

## 2020-11-19 NOTE — ED Triage Notes (Signed)
Pt to the ED via EMS with c/o a fall today in the kitchen.  Pt states she hit her right shoulder on the cabinet.  Pt denies hitting her head or taking blood thinners.

## 2020-11-21 DIAGNOSIS — M4712 Other spondylosis with myelopathy, cervical region: Secondary | ICD-10-CM | POA: Diagnosis not present

## 2020-11-21 DIAGNOSIS — S40011A Contusion of right shoulder, initial encounter: Secondary | ICD-10-CM | POA: Diagnosis not present

## 2020-11-21 DIAGNOSIS — R296 Repeated falls: Secondary | ICD-10-CM | POA: Diagnosis not present

## 2020-11-21 DIAGNOSIS — S8002XA Contusion of left knee, initial encounter: Secondary | ICD-10-CM | POA: Diagnosis not present

## 2020-11-21 DIAGNOSIS — Z6829 Body mass index (BMI) 29.0-29.9, adult: Secondary | ICD-10-CM | POA: Diagnosis not present

## 2020-12-01 ENCOUNTER — Inpatient Hospital Stay (HOSPITAL_COMMUNITY)
Admission: EM | Admit: 2020-12-01 | Discharge: 2020-12-04 | DRG: 563 | Disposition: A | Discharge status: Skilled Nursing Facility | Payer: Medicare HMO | Attending: Family Medicine | Admitting: Family Medicine

## 2020-12-01 ENCOUNTER — Emergency Department (HOSPITAL_COMMUNITY): Payer: Medicare HMO

## 2020-12-01 ENCOUNTER — Encounter (HOSPITAL_COMMUNITY): Payer: Self-pay | Admitting: Emergency Medicine

## 2020-12-01 ENCOUNTER — Other Ambulatory Visit: Payer: Self-pay

## 2020-12-01 DIAGNOSIS — E1140 Type 2 diabetes mellitus with diabetic neuropathy, unspecified: Secondary | ICD-10-CM | POA: Diagnosis not present

## 2020-12-01 DIAGNOSIS — R0689 Other abnormalities of breathing: Secondary | ICD-10-CM | POA: Diagnosis not present

## 2020-12-01 DIAGNOSIS — Z043 Encounter for examination and observation following other accident: Secondary | ICD-10-CM | POA: Diagnosis not present

## 2020-12-01 DIAGNOSIS — M79662 Pain in left lower leg: Secondary | ICD-10-CM | POA: Diagnosis not present

## 2020-12-01 DIAGNOSIS — E1159 Type 2 diabetes mellitus with other circulatory complications: Secondary | ICD-10-CM | POA: Diagnosis present

## 2020-12-01 DIAGNOSIS — M79652 Pain in left thigh: Secondary | ICD-10-CM | POA: Diagnosis not present

## 2020-12-01 DIAGNOSIS — M6281 Muscle weakness (generalized): Secondary | ICD-10-CM | POA: Diagnosis not present

## 2020-12-01 DIAGNOSIS — E119 Type 2 diabetes mellitus without complications: Secondary | ICD-10-CM | POA: Diagnosis not present

## 2020-12-01 DIAGNOSIS — N179 Acute kidney failure, unspecified: Secondary | ICD-10-CM | POA: Diagnosis present

## 2020-12-01 DIAGNOSIS — M25452 Effusion, left hip: Secondary | ICD-10-CM | POA: Diagnosis not present

## 2020-12-01 DIAGNOSIS — Z7984 Long term (current) use of oral hypoglycemic drugs: Secondary | ICD-10-CM | POA: Diagnosis not present

## 2020-12-01 DIAGNOSIS — D649 Anemia, unspecified: Secondary | ICD-10-CM | POA: Diagnosis not present

## 2020-12-01 DIAGNOSIS — R2689 Other abnormalities of gait and mobility: Secondary | ICD-10-CM | POA: Diagnosis present

## 2020-12-01 DIAGNOSIS — M1712 Unilateral primary osteoarthritis, left knee: Secondary | ICD-10-CM | POA: Diagnosis not present

## 2020-12-01 DIAGNOSIS — I10 Essential (primary) hypertension: Secondary | ICD-10-CM | POA: Diagnosis present

## 2020-12-01 DIAGNOSIS — Z8249 Family history of ischemic heart disease and other diseases of the circulatory system: Secondary | ICD-10-CM

## 2020-12-01 DIAGNOSIS — E1142 Type 2 diabetes mellitus with diabetic polyneuropathy: Secondary | ICD-10-CM | POA: Diagnosis not present

## 2020-12-01 DIAGNOSIS — M4807 Spinal stenosis, lumbosacral region: Secondary | ICD-10-CM | POA: Diagnosis present

## 2020-12-01 DIAGNOSIS — M1612 Unilateral primary osteoarthritis, left hip: Secondary | ICD-10-CM | POA: Diagnosis not present

## 2020-12-01 DIAGNOSIS — Z79899 Other long term (current) drug therapy: Secondary | ICD-10-CM

## 2020-12-01 DIAGNOSIS — Y92009 Unspecified place in unspecified non-institutional (private) residence as the place of occurrence of the external cause: Secondary | ICD-10-CM

## 2020-12-01 DIAGNOSIS — R262 Difficulty in walking, not elsewhere classified: Secondary | ICD-10-CM | POA: Diagnosis not present

## 2020-12-01 DIAGNOSIS — E1165 Type 2 diabetes mellitus with hyperglycemia: Secondary | ICD-10-CM | POA: Diagnosis not present

## 2020-12-01 DIAGNOSIS — E7800 Pure hypercholesterolemia, unspecified: Secondary | ICD-10-CM | POA: Diagnosis present

## 2020-12-01 DIAGNOSIS — R52 Pain, unspecified: Secondary | ICD-10-CM | POA: Diagnosis present

## 2020-12-01 DIAGNOSIS — R739 Hyperglycemia, unspecified: Secondary | ICD-10-CM | POA: Diagnosis present

## 2020-12-01 DIAGNOSIS — M79605 Pain in left leg: Secondary | ICD-10-CM | POA: Diagnosis not present

## 2020-12-01 DIAGNOSIS — S82409A Unspecified fracture of shaft of unspecified fibula, initial encounter for closed fracture: Secondary | ICD-10-CM | POA: Diagnosis not present

## 2020-12-01 DIAGNOSIS — N189 Chronic kidney disease, unspecified: Secondary | ICD-10-CM | POA: Diagnosis present

## 2020-12-01 DIAGNOSIS — W19XXXA Unspecified fall, initial encounter: Secondary | ICD-10-CM

## 2020-12-01 DIAGNOSIS — S93402A Sprain of unspecified ligament of left ankle, initial encounter: Secondary | ICD-10-CM | POA: Diagnosis not present

## 2020-12-01 DIAGNOSIS — M5117 Intervertebral disc disorders with radiculopathy, lumbosacral region: Secondary | ICD-10-CM | POA: Diagnosis present

## 2020-12-01 DIAGNOSIS — Z20822 Contact with and (suspected) exposure to covid-19: Secondary | ICD-10-CM | POA: Diagnosis present

## 2020-12-01 DIAGNOSIS — Z885 Allergy status to narcotic agent status: Secondary | ICD-10-CM

## 2020-12-01 DIAGNOSIS — E785 Hyperlipidemia, unspecified: Secondary | ICD-10-CM | POA: Diagnosis present

## 2020-12-01 DIAGNOSIS — W010XXA Fall on same level from slipping, tripping and stumbling without subsequent striking against object, initial encounter: Secondary | ICD-10-CM | POA: Diagnosis present

## 2020-12-01 DIAGNOSIS — Z90710 Acquired absence of both cervix and uterus: Secondary | ICD-10-CM

## 2020-12-01 DIAGNOSIS — Z862 Personal history of diseases of the blood and blood-forming organs and certain disorders involving the immune mechanism: Secondary | ICD-10-CM | POA: Diagnosis present

## 2020-12-01 DIAGNOSIS — M7989 Other specified soft tissue disorders: Secondary | ICD-10-CM | POA: Diagnosis not present

## 2020-12-01 DIAGNOSIS — M79606 Pain in leg, unspecified: Secondary | ICD-10-CM | POA: Diagnosis present

## 2020-12-01 DIAGNOSIS — R296 Repeated falls: Secondary | ICD-10-CM | POA: Diagnosis not present

## 2020-12-01 DIAGNOSIS — S82832D Other fracture of upper and lower end of left fibula, subsequent encounter for closed fracture with routine healing: Secondary | ICD-10-CM | POA: Diagnosis not present

## 2020-12-01 DIAGNOSIS — G629 Polyneuropathy, unspecified: Secondary | ICD-10-CM

## 2020-12-01 DIAGNOSIS — M25572 Pain in left ankle and joints of left foot: Secondary | ICD-10-CM | POA: Diagnosis not present

## 2020-12-01 DIAGNOSIS — M25562 Pain in left knee: Secondary | ICD-10-CM | POA: Diagnosis not present

## 2020-12-01 DIAGNOSIS — S82832A Other fracture of upper and lower end of left fibula, initial encounter for closed fracture: Principal | ICD-10-CM | POA: Diagnosis present

## 2020-12-01 DIAGNOSIS — I152 Hypertension secondary to endocrine disorders: Secondary | ICD-10-CM | POA: Diagnosis present

## 2020-12-01 DIAGNOSIS — S82402A Unspecified fracture of shaft of left fibula, initial encounter for closed fracture: Secondary | ICD-10-CM | POA: Diagnosis not present

## 2020-12-01 DIAGNOSIS — M879 Osteonecrosis, unspecified: Secondary | ICD-10-CM | POA: Diagnosis not present

## 2020-12-01 DIAGNOSIS — M25552 Pain in left hip: Secondary | ICD-10-CM | POA: Diagnosis present

## 2020-12-01 DIAGNOSIS — M87852 Other osteonecrosis, left femur: Secondary | ICD-10-CM | POA: Diagnosis not present

## 2020-12-01 DIAGNOSIS — M545 Low back pain, unspecified: Secondary | ICD-10-CM | POA: Diagnosis not present

## 2020-12-01 DIAGNOSIS — M5410 Radiculopathy, site unspecified: Secondary | ICD-10-CM | POA: Diagnosis not present

## 2020-12-01 NOTE — ED Triage Notes (Signed)
Pt arrives via [facility] for pain to left hip from this morning. Pt seen earlier today at urgent care for same. States [facility] performed multiple xrays but didn't xray hip. Pt given 21mg fentanyl by EMS.

## 2020-12-02 ENCOUNTER — Emergency Department (HOSPITAL_COMMUNITY): Payer: Medicare HMO

## 2020-12-02 ENCOUNTER — Encounter (HOSPITAL_COMMUNITY): Payer: Self-pay | Admitting: Family Medicine

## 2020-12-02 ENCOUNTER — Observation Stay (HOSPITAL_COMMUNITY): Payer: Medicare HMO

## 2020-12-02 DIAGNOSIS — E7800 Pure hypercholesterolemia, unspecified: Secondary | ICD-10-CM | POA: Diagnosis present

## 2020-12-02 DIAGNOSIS — Z043 Encounter for examination and observation following other accident: Secondary | ICD-10-CM | POA: Diagnosis not present

## 2020-12-02 DIAGNOSIS — E119 Type 2 diabetes mellitus without complications: Secondary | ICD-10-CM

## 2020-12-02 DIAGNOSIS — M25552 Pain in left hip: Secondary | ICD-10-CM | POA: Diagnosis present

## 2020-12-02 DIAGNOSIS — R739 Hyperglycemia, unspecified: Secondary | ICD-10-CM | POA: Diagnosis present

## 2020-12-02 DIAGNOSIS — M87852 Other osteonecrosis, left femur: Secondary | ICD-10-CM | POA: Diagnosis not present

## 2020-12-02 DIAGNOSIS — N179 Acute kidney failure, unspecified: Secondary | ICD-10-CM | POA: Diagnosis present

## 2020-12-02 DIAGNOSIS — R2689 Other abnormalities of gait and mobility: Secondary | ICD-10-CM | POA: Diagnosis not present

## 2020-12-02 DIAGNOSIS — I10 Essential (primary) hypertension: Secondary | ICD-10-CM | POA: Diagnosis not present

## 2020-12-02 DIAGNOSIS — M545 Low back pain, unspecified: Secondary | ICD-10-CM | POA: Diagnosis not present

## 2020-12-02 DIAGNOSIS — E1159 Type 2 diabetes mellitus with other circulatory complications: Secondary | ICD-10-CM | POA: Diagnosis present

## 2020-12-02 DIAGNOSIS — D649 Anemia, unspecified: Secondary | ICD-10-CM | POA: Diagnosis present

## 2020-12-02 DIAGNOSIS — M79606 Pain in leg, unspecified: Secondary | ICD-10-CM | POA: Diagnosis present

## 2020-12-02 DIAGNOSIS — S82832A Other fracture of upper and lower end of left fibula, initial encounter for closed fracture: Secondary | ICD-10-CM | POA: Diagnosis not present

## 2020-12-02 DIAGNOSIS — S82409A Unspecified fracture of shaft of unspecified fibula, initial encounter for closed fracture: Secondary | ICD-10-CM | POA: Diagnosis not present

## 2020-12-02 DIAGNOSIS — N189 Chronic kidney disease, unspecified: Secondary | ICD-10-CM | POA: Diagnosis present

## 2020-12-02 DIAGNOSIS — M1612 Unilateral primary osteoarthritis, left hip: Secondary | ICD-10-CM | POA: Diagnosis not present

## 2020-12-02 DIAGNOSIS — I152 Hypertension secondary to endocrine disorders: Secondary | ICD-10-CM | POA: Diagnosis present

## 2020-12-02 DIAGNOSIS — R296 Repeated falls: Secondary | ICD-10-CM | POA: Diagnosis not present

## 2020-12-02 DIAGNOSIS — M25452 Effusion, left hip: Secondary | ICD-10-CM | POA: Diagnosis not present

## 2020-12-02 DIAGNOSIS — G629 Polyneuropathy, unspecified: Secondary | ICD-10-CM

## 2020-12-02 LAB — CBC WITH DIFFERENTIAL/PLATELET
Abs Immature Granulocytes: 0.01 10*3/uL (ref 0.00–0.07)
Basophils Absolute: 0 10*3/uL (ref 0.0–0.1)
Basophils Relative: 0 %
Eosinophils Absolute: 0.1 10*3/uL (ref 0.0–0.5)
Eosinophils Relative: 2 %
HCT: 34.4 % — ABNORMAL LOW (ref 36.0–46.0)
Hemoglobin: 11.3 g/dL — ABNORMAL LOW (ref 12.0–15.0)
Immature Granulocytes: 0 %
Lymphocytes Relative: 40 %
Lymphs Abs: 2.6 10*3/uL (ref 0.7–4.0)
MCH: 30.5 pg (ref 26.0–34.0)
MCHC: 32.8 g/dL (ref 30.0–36.0)
MCV: 93 fL (ref 80.0–100.0)
Monocytes Absolute: 0.5 10*3/uL (ref 0.1–1.0)
Monocytes Relative: 8 %
Neutro Abs: 3.2 10*3/uL (ref 1.7–7.7)
Neutrophils Relative %: 50 %
Platelets: 216 10*3/uL (ref 150–400)
RBC: 3.7 MIL/uL — ABNORMAL LOW (ref 3.87–5.11)
RDW: 13.7 % (ref 11.5–15.5)
WBC: 6.4 10*3/uL (ref 4.0–10.5)
nRBC: 0 % (ref 0.0–0.2)

## 2020-12-02 LAB — GLUCOSE, CAPILLARY
Glucose-Capillary: 231 mg/dL — ABNORMAL HIGH (ref 70–99)
Glucose-Capillary: 109 mg/dL — ABNORMAL HIGH (ref 70–99)
Glucose-Capillary: 89 mg/dL (ref 70–99)

## 2020-12-02 LAB — BASIC METABOLIC PANEL
Anion gap: 10 (ref 5–15)
BUN: 33 mg/dL — ABNORMAL HIGH (ref 8–23)
CO2: 23 mmol/L (ref 22–32)
Calcium: 9 mg/dL (ref 8.9–10.3)
Chloride: 104 mmol/L (ref 98–111)
Creatinine, Ser: 1.13 mg/dL — ABNORMAL HIGH (ref 0.44–1.00)
GFR, Estimated: 49 mL/min — ABNORMAL LOW (ref >60)
Glucose, Bld: 209 mg/dL — ABNORMAL HIGH (ref 70–99)
Potassium: 4.3 mmol/L (ref 3.5–5.1)
Sodium: 137 mmol/L (ref 135–145)

## 2020-12-02 LAB — RESP PANEL BY RT-PCR (FLU A&B, COVID) ARPGX2
Influenza A by PCR: NEGATIVE
Influenza B by PCR: NEGATIVE
SARS Coronavirus 2 by RT PCR: NEGATIVE

## 2020-12-02 LAB — HEMOGLOBIN A1C
Hgb A1c MFr Bld: 8.6 % — ABNORMAL HIGH (ref 4.8–5.6)
Mean Plasma Glucose: 200.12 mg/dL

## 2020-12-02 LAB — CBG MONITORING, ED: Glucose-Capillary: 245 mg/dL — ABNORMAL HIGH (ref 70–99)

## 2020-12-02 MED ORDER — FENTANYL CITRATE (PF) 100 MCG/2ML IJ SOLN
25.0000 ug | INTRAMUSCULAR | Status: DC | PRN
Start: 2020-12-02 — End: 2020-12-04
  Administered 2020-12-02 – 2020-12-03 (×5): 25 ug via INTRAVENOUS
  Filled 2020-12-02 (×5): qty 2

## 2020-12-02 MED ORDER — INSULIN ASPART 100 UNIT/ML ~~LOC~~ SOLN: 0.0000 [IU] | Freq: Every day | SUBCUTANEOUS | Status: DC

## 2020-12-02 MED ORDER — PANTOPRAZOLE SODIUM 40 MG PO TBEC
40.0000 mg | DELAYED_RELEASE_TABLET | Freq: Every day | ORAL | Status: DC
  Administered 2020-12-02 – 2020-12-04 (×3): 40 mg via ORAL
  Filled 2020-12-02 (×3): qty 1

## 2020-12-02 MED ORDER — ACETAMINOPHEN 325 MG PO TABS
650.0000 mg | ORAL_TABLET | Freq: Four times a day (QID) | ORAL | Status: DC
  Administered 2020-12-02 – 2020-12-04 (×7): 650 mg via ORAL
  Filled 2020-12-02 (×8): qty 2

## 2020-12-02 MED ORDER — ONDANSETRON HCL 4 MG PO TABS: 4.0000 mg | ORAL_TABLET | Freq: Four times a day (QID) | ORAL | Status: DC | PRN

## 2020-12-02 MED ORDER — HYDROMORPHONE HCL 1 MG/ML IJ SOLN
0.5000 mg | Freq: Once | INTRAMUSCULAR | Status: AC
  Administered 2020-12-02: 0.5 mg via INTRAVENOUS
  Filled 2020-12-02: qty 1

## 2020-12-02 MED ORDER — INSULIN ASPART 100 UNIT/ML ~~LOC~~ SOLN
6.0000 [IU] | Freq: Three times a day (TID) | SUBCUTANEOUS | Status: DC
  Administered 2020-12-03 (×2): 6 [IU] via SUBCUTANEOUS

## 2020-12-02 MED ORDER — GABAPENTIN 100 MG PO CAPS
100.0000 mg | ORAL_CAPSULE | Freq: Three times a day (TID) | ORAL | Status: DC
  Administered 2020-12-02 – 2020-12-03 (×4): 100 mg via ORAL
  Filled 2020-12-02 (×4): qty 1

## 2020-12-02 MED ORDER — HYDROCODONE-ACETAMINOPHEN 5-325 MG PO TABS
1.0000 | ORAL_TABLET | Freq: Once | ORAL | Status: AC
Start: 2020-12-02 — End: 2020-12-02
  Administered 2020-12-02: 1 via ORAL
  Filled 2020-12-02: qty 1

## 2020-12-02 MED ORDER — BISACODYL 10 MG RE SUPP: 10.0000 mg | Freq: Every day | RECTAL | Status: DC | PRN

## 2020-12-02 MED ORDER — INSULIN ASPART 100 UNIT/ML ~~LOC~~ SOLN
0.0000 [IU] | Freq: Three times a day (TID) | SUBCUTANEOUS | Status: DC
  Administered 2020-12-02 – 2020-12-03 (×2): 5 [IU] via SUBCUTANEOUS
  Administered 2020-12-03: 2 [IU] via SUBCUTANEOUS

## 2020-12-02 MED ORDER — INSULIN ASPART 100 UNIT/ML ~~LOC~~ SOLN
6.0000 [IU] | Freq: Three times a day (TID) | SUBCUTANEOUS | Status: DC
  Administered 2020-12-02: 6 [IU] via SUBCUTANEOUS

## 2020-12-02 MED ORDER — INSULIN GLARGINE 100 UNIT/ML ~~LOC~~ SOLN
20.0000 [IU] | Freq: Every day | SUBCUTANEOUS | Status: DC
  Administered 2020-12-03 – 2020-12-04 (×2): 20 [IU] via SUBCUTANEOUS
  Filled 2020-12-02 (×3): qty 0.2

## 2020-12-02 MED ORDER — INSULIN ASPART 100 UNIT/ML ~~LOC~~ SOLN
10.0000 [IU] | Freq: Three times a day (TID) | SUBCUTANEOUS | Status: DC
  Administered 2020-12-02: 10 [IU] via SUBCUTANEOUS

## 2020-12-02 MED ORDER — ATORVASTATIN CALCIUM 20 MG PO TABS
20.0000 mg | ORAL_TABLET | Freq: Every evening | ORAL | Status: DC
  Administered 2020-12-02 – 2020-12-03 (×2): 20 mg via ORAL
  Filled 2020-12-02 (×2): qty 1

## 2020-12-02 MED ORDER — SODIUM CHLORIDE 0.9 % IV SOLN: INTRAVENOUS | Status: AC

## 2020-12-02 MED ORDER — ADULT MULTIVITAMIN W/MINERALS CH
1.0000 | ORAL_TABLET | Freq: Every day | ORAL | Status: DC
  Administered 2020-12-02 – 2020-12-04 (×3): 1 via ORAL
  Filled 2020-12-02 (×3): qty 1

## 2020-12-02 MED ORDER — ACETAMINOPHEN 650 MG RE SUPP: 650.0000 mg | Freq: Four times a day (QID) | RECTAL | Status: DC

## 2020-12-02 MED ORDER — INSULIN GLARGINE 100 UNIT/ML ~~LOC~~ SOLN
30.0000 [IU] | Freq: Every day | SUBCUTANEOUS | Status: DC
  Administered 2020-12-02: 30 [IU] via SUBCUTANEOUS
  Filled 2020-12-02 (×2): qty 0.3

## 2020-12-02 MED ORDER — ALBUTEROL SULFATE (2.5 MG/3ML) 0.083% IN NEBU: 3.0000 mL | INHALATION_SOLUTION | RESPIRATORY_TRACT | Status: DC | PRN

## 2020-12-02 MED ORDER — ENOXAPARIN SODIUM 40 MG/0.4ML ~~LOC~~ SOLN
40.0000 mg | SUBCUTANEOUS | Status: DC
  Administered 2020-12-02: 40 mg via SUBCUTANEOUS
  Filled 2020-12-02: qty 0.4

## 2020-12-02 MED ORDER — DOCUSATE SODIUM 100 MG PO CAPS
100.0000 mg | ORAL_CAPSULE | Freq: Two times a day (BID) | ORAL | Status: DC
  Administered 2020-12-02 – 2020-12-04 (×5): 100 mg via ORAL
  Filled 2020-12-02 (×5): qty 1

## 2020-12-02 MED ORDER — OXYCODONE HCL 5 MG PO TABS
5.0000 mg | ORAL_TABLET | ORAL | Status: DC | PRN
Start: 2020-12-02 — End: 2020-12-03
  Administered 2020-12-02 – 2020-12-03 (×3): 5 mg via ORAL
  Filled 2020-12-02 (×3): qty 1

## 2020-12-02 MED ORDER — ONDANSETRON HCL 4 MG/2ML IJ SOLN
4.0000 mg | Freq: Four times a day (QID) | INTRAMUSCULAR | Status: DC | PRN
  Administered 2020-12-02: 4 mg via INTRAVENOUS
  Filled 2020-12-02: qty 2

## 2020-12-02 MED ORDER — LISINOPRIL 10 MG PO TABS
10.0000 mg | ORAL_TABLET | Freq: Every day | ORAL | Status: DC
  Administered 2020-12-02 – 2020-12-04 (×3): 10 mg via ORAL
  Filled 2020-12-02 (×3): qty 1

## 2020-12-02 NOTE — ED Notes (Signed)
Pt unable to stand to ambulate

## 2020-12-02 NOTE — H&P (Signed)
History and Physical  [facility]  TEMEIKA MOULDEN [id number] DOB: 04-Dec-1935 DOA: 12/01/2020  PCP: Curlene Labrum, MD  Patient coming from: Home  Level of care: Med-Surg  I have personally briefly reviewed patient's old medical records in [place]  Chief Complaint: Fall at home   HPI: [name] is a 85 y.o. female with medical history significant for type 2 diabetes mellitus, hyperlipidemia, normal diabetic neuropathy, chronic neck pain presenting to the emergency department after a fall at home.  She reported that she ended up losing balance when trying to make his son close.  She said that she fell on her left side.  She hit her head.  There was no loss of consciousness.  Since that time she has had severe left hip left knee and left ankle pain.  She was seen initially at an urgent care facility.  She was told that she may have had an ankle fracture and was placed in a fracture boot.  She reports that she had no chest pain or shortness of breath dizziness that led to her potential to fall.  She has no visual changes and no focal weakness or numbness.  Her main complaint is severe pain inability to ambulate because of pain involving the left leg, knee and hip.    ED Course: SARS 2 coronavirus and influenza tests were negative.  CT of the cervical spine with no acute bony abnormality.  CT brain with no acute abnormality.  CT of the pelvis shows degenerative changes in the hips and avascular necrosis of hips.  Scan of the left knee without contrast with findings of subtle acute nondisplaced fracture through the medial aspect of the head of the fibula.  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  X-rays of the ankle medial shoulder and tib-fib on the left did not show any acute findings of fracture.    Basic metabolic panel showed glucose of 219, BUN 33, creatinine 1.13 with estimated GFR 49.  WBC 6.4 hemoglobin 11.3 platelet count 216.  Pt was started on pain  management and admission was requested for further management.  Patient continued to have significant pain despite pain medication inability to ambulate.  Dr. Wyvonnia Dusky in ED ordered MRI of the hip and lumbar spine to rule out an occult fracture.    Review of Systems: Review of Systems  Constitutional: Negative.   HENT: Negative.   Eyes: Negative.   Respiratory: Negative.   Cardiovascular: Negative.   Gastrointestinal: Negative.   Genitourinary: Negative.   Musculoskeletal: Positive for falls and joint pain.  Skin: Negative.   Neurological: Negative.   Endo/Heme/Allergies: Negative.   Psychiatric/Behavioral: Negative.   All other systems reviewed and are negative.    Past Medical History:  Diagnosis Date  . Diabetes mellitus without complication ([place])   . High cholesterol   . Neck pain   . Neuropathy     Past Surgical History:  Procedure Laterality Date  . ABDOMINAL HYSTERECTOMY    . APPENDECTOMY    . BACK SURGERY    . CERVICAL DISC SURGERY    . HAND SURGERY    . KNEE SURGERY       reports that she has never smoked. She has never used smokeless tobacco. She reports that she does not drink alcohol and does not use drugs.  Allergies  Allergen Reactions  . Codeine     Family History  Problem Relation Age of Onset  . Cardiomyopathy Father   .  Cancer Mother     Prior to Admission medications   Medication Sig Start Date End Date Taking? Authorizing Provider  atorvastatin (LIPITOR) 20 MG tablet Take 20 mg by mouth daily.    [provider]  chlorthalidone (HYGROTON) 50 MG tablet Take by mouth daily.    [provider]  metFORMIN (GLUCOPHAGE) 500 MG tablet Take 500 mg by mouth 2 (two) times daily with a meal.    [provider]  Multiple Vitamin (MULTIVITAMIN WITH MINERALS) TABS tablet Take 1 tablet by mouth daily.    [provider]  pantoprazole (PROTONIX) 40 MG tablet Take 40 mg by mouth daily.    [provider]  psyllium  (REGULOID) 0.52 g capsule Take 0.52 g by mouth daily.    [provider]  SUPER B COMPLEX/C PO Take 1 each by mouth.    [provider]    Physical Exam: Vitals:   12/02/20 0400 12/02/20 0430 12/02/20 0500 12/02/20 0530  BP: 130/62 (!) 134/52 (!) 149/62 (!) 127/51  Pulse: 74 67    Resp:    19  Temp:      TempSrc:      SpO2: 99% 96%  97%  Weight:      Height:        Constitutional: frail, elderly female, appears uncomfortable lying supine on the gurney, mildly distressed.  Eyes: PERRL, lids and conjunctivae normal ENMT: Mucous membranes are moist. Posterior pharynx clear of any exudate or lesions. Neck: normal, supple, no masses, no thyromegaly Respiratory: clear to auscultation bilaterally, no wheezing, no crackles. Normal respiratory effort. No accessory muscle use.  Cardiovascular: normal s1, s2 sounds, no murmurs / rubs / gallops. No extremity edema. 2+ pedal pulses. No carotid bruits.  Abdomen: no tenderness, no masses palpated. No hepatosplenomegaly. Bowel sounds positive.  Musculoskeletal: no clubbing / cyanosis. Severe pain of left knee with swelling, pain on side of left hip, no bruising seen, pulses palpated left leg and foot, No joint deformity upper and lower extremities. Normal muscle tone. ROM limited by pain.  Skin: no rashes, lesions, ulcers. No induration Neurologic: CN 2-12 grossly intact. Sensation intact, DTR normal. Strength 5/5 in all 4.  Psychiatric: Normal judgment and insight. Alert and oriented x 3. Normal mood.   Labs on Admission: I have personally reviewed following labs and imaging studies  CBC: Recent Labs  Lab 12/02/20 0454  WBC 6.4  NEUTROABS 3.2  HGB 11.3*  HCT 34.4*  MCV 93.0  PLT 123XX123   Basic Metabolic Panel: Recent Labs  Lab 12/02/20 0454  NA 137  K 4.3  CL 104  CO2 23  GLUCOSE 209*  BUN 33*  CREATININE 1.13*  CALCIUM 9.0   GFR: Estimated Creatinine Clearance: 36.4 mL/min (A) (by C-G formula based on SCr of  1.13 mg/dL (H)). Liver Function Tests: No results for input(s): AST, ALT, ALKPHOS, BILITOT, PROT, ALBUMIN in the last 168 hours. No results for input(s): LIPASE, AMYLASE in the last 168 hours. No results for input(s): AMMONIA in the last 168 hours. Coagulation Profile: No results for input(s): INR, PROTIME in the last 168 hours. Cardiac Enzymes: No results for input(s): CKTOTAL, CKMB, CKMBINDEX, TROPONINI in the last 168 hours. BNP (last 3 results) No results for input(s): PROBNP in the last 8760 hours. HbA1C: No results for input(s): HGBA1C in the last 72 hours. CBG: No results for input(s): GLUCAP in the last 168 hours. Lipid Profile: No results for input(s): CHOL, HDL, LDLCALC, TRIG, CHOLHDL, LDLDIRECT in the  last 72 hours. Thyroid Function Tests: No results for input(s): TSH, T4TOTAL, FREET4, T3FREE, THYROIDAB in the last 72 hours. Anemia Panel: No results for input(s): VITAMINB12, FOLATE, FERRITIN, TIBC, IRON, RETICCTPCT in the last 72 hours. Urine analysis: No results found for: COLORURINE, APPEARANCEUR, [place], [place], GLUCOSEU, HGBUR, BILIRUBINUR, KETONESUR, PROTEINUR, UROBILINOGEN, NITRITE, LEUKOCYTESUR  Radiological Exams on Admission: DG Tibia/Fibula Left  Result Date: 12/02/2020 CLINICAL DATA:  Pain status post fall. EXAM: LEFT TIBIA AND FIBULA - 2 VIEW; LEFT FEMUR 2 VIEWS; LEFT KNEE - COMPLETE 4+ VIEW; LEFT ANKLE COMPLETE - 3+ VIEW COMPARISON:  None. FINDINGS: There is mild soft tissue swelling about the patient's ankle without evidence for an acute displaced fracture or dislocation. There are degenerative changes of the ankle mortise. There is a moderate-sized plantar calcaneal spur. Vascular calcifications are noted. There are degenerative changes of the knee without evidence for significant joint effusion or acute displaced fracture. There is no acute osseous abnormality involving the patient's left femur. There is no hip dislocation. Mild degenerative changes are noted of  the patient's left hip. Vascular calcifications are noted. IMPRESSION: 1. No acute displaced fracture or dislocation involving the patient's left lower extremity. 2. Mild soft tissue swelling about the ankle. 3. Degenerative changes of the knee and hip. Electronically Signed   By: Constance Holster M.D.   On: 12/02/2020 01:19   DG Ankle Complete Left  Result Date: 12/02/2020 CLINICAL DATA:  Pain status post fall. EXAM: LEFT TIBIA AND FIBULA - 2 VIEW; LEFT FEMUR 2 VIEWS; LEFT KNEE - COMPLETE 4+ VIEW; LEFT ANKLE COMPLETE - 3+ VIEW COMPARISON:  None. FINDINGS: There is mild soft tissue swelling about the patient's ankle without evidence for an acute displaced fracture or dislocation. There are degenerative changes of the ankle mortise. There is a moderate-sized plantar calcaneal spur. Vascular calcifications are noted. There are degenerative changes of the knee without evidence for significant joint effusion or acute displaced fracture. There is no acute osseous abnormality involving the patient's left femur. There is no hip dislocation. Mild degenerative changes are noted of the patient's left hip. Vascular calcifications are noted. IMPRESSION: 1. No acute displaced fracture or dislocation involving the patient's left lower extremity. 2. Mild soft tissue swelling about the ankle. 3. Degenerative changes of the knee and hip. Electronically Signed   By: Constance Holster M.D.   On: 12/02/2020 01:19   CT Head Wo Contrast  Result Date: 12/02/2020 CLINICAL DATA:  Fall EXAM: CT HEAD WITHOUT CONTRAST TECHNIQUE: Contiguous axial images were obtained from the base of the skull through the vertex without intravenous contrast. COMPARISON:  08/20/2019 FINDINGS: Brain: No acute intracranial abnormality. Specifically, no hemorrhage, hydrocephalus, mass lesion, acute infarction, or significant intracranial injury. Vascular: No hyperdense vessel or unexpected calcification. Skull: No acute calvarial abnormality.  Sinuses/Orbits: Polypoid filling defects in both maxillary sinuses. No air-fluid levels. Other: None IMPRESSION: No acute intracranial abnormality. Polypoid sinus disease. Electronically Signed   By: Rolm Baptise M.D.   On: 12/02/2020 01:45   CT Cervical Spine Wo Contrast  Result Date: 12/02/2020 CLINICAL DATA:  Fall EXAM: CT CERVICAL SPINE WITHOUT CONTRAST TECHNIQUE: Multidetector CT imaging of the cervical spine was performed without intravenous contrast. Multiplanar CT image reconstructions were also generated. COMPARISON:  08/20/2019 FINDINGS: Alignment: No subluxation. Skull base and vertebrae: No acute fracture. No primary bone lesion or focal pathologic process. Soft tissues and spinal canal: No prevertebral fluid or swelling. No visible canal hematoma. Disc levels: Prior anterior fusion from C4-C6. Advanced degenerative disc disease  at C3-4 and C6-7. Mild bilateral diffuse degenerative facet disease. Upper chest: Biapical scarring. Other: None IMPRESSION: Postoperative and degenerative changes in the cervical spine. No acute bony abnormality. Electronically Signed   By: Rolm Baptise M.D.   On: 12/02/2020 01:46   CT PELVIS WO CONTRAST  Result Date: 12/02/2020 CLINICAL DATA:  Left hip pain after a fall on 11/19/2020. EXAM: CT PELVIS WITHOUT CONTRAST TECHNIQUE: Multidetector CT imaging of the pelvis was performed following the standard protocol without intravenous contrast. COMPARISON:  CT abdomen and pelvis 10/11/2018 FINDINGS: Urinary Tract:  No bladder wall thickening or filling defect. Bowel: Visualized portions of large and small bowel are not abnormally distended. Scattered stool in the colon. Sigmoid colonic diverticula without evidence of diverticulitis. Appendix is not identified. Vascular/Lymphatic: Vascular calcifications in the iliac arteries. No aneurysm. Reproductive: Surgical absence of the uterus. No abnormal adnexal masses. Other: No free fluid in the pelvis. No significant pelvic  lymphadenopathy. Musculoskeletal: Degenerative changes in the lower lumbar spine. 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 No evidence of acute fracture or dislocation of the pelvis or hips. Sacrum appears intact. Degenerative changes in the hips with subcortical cysts on the left hip. IMPRESSION: 1. No evidence of acute fracture or dislocation of the pelvis or hips. 2. 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 3. Degenerative changes in the hips with subcortical cysts on the left hip. Electronically Signed   By: Lucienne Capers M.D.   On: 12/02/2020 01:50   CT Knee Left Wo Contrast  Result Date: 12/02/2020 CLINICAL DATA:  Knee pain EXAM: CT OF THE LEFT KNEE WITHOUT CONTRAST TECHNIQUE: Multidetector CT imaging of the LEFT knee was performed according to the standard protocol. Multiplanar CT image reconstructions were also generated. COMPARISON:  None. FINDINGS: Bones/Joint/Cartilage There is a subtle acute nondisplaced fracture through the medial aspect of the head of the fibula (axial series 4, image 99). 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 There are intra-articular loose bodies. Ligaments Suboptimally assessed by CT. Muscles and Tendons There is acute intramuscular abnormality. Soft tissues There is a small joint effusion. IMPRESSION: 1. Subtle acute nondisplaced fracture through the medial aspect of the head of the fibula. 2. 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 3. There is a small joint effusion. Electronically Signed   By: Constance Holster M.D.   On: 12/02/2020 03:34   DG Knee Complete 4 Views Left  Result Date: 12/02/2020 CLINICAL DATA:  Pain status post fall. EXAM: LEFT TIBIA AND FIBULA - 2 VIEW; LEFT FEMUR 2 VIEWS; LEFT KNEE - COMPLETE 4+ VIEW; LEFT ANKLE COMPLETE - 3+ VIEW COMPARISON:  None. FINDINGS: There is mild soft tissue swelling about the patient's ankle  without evidence for an acute displaced fracture or dislocation. There are degenerative changes of the ankle mortise. There is a moderate-sized plantar calcaneal spur. Vascular calcifications are noted. There are degenerative changes of the knee without evidence for significant joint effusion or acute displaced fracture. There is no acute osseous abnormality involving the patient's left femur. There is no hip dislocation. Mild degenerative changes are noted of the patient's left hip. Vascular calcifications are noted. IMPRESSION: 1. No acute displaced fracture or dislocation involving the patient's left lower extremity. 2. Mild soft tissue swelling about the ankle. 3. Degenerative changes of the knee and hip. Electronically Signed   By: Constance Holster M.D.   On: 12/02/2020 01:19   DG Femur Min 2 Views Left  Result Date: 12/02/2020 CLINICAL DATA:  Pain status post fall. EXAM: LEFT TIBIA AND FIBULA - 2 VIEW; LEFT FEMUR 2 VIEWS; LEFT KNEE - COMPLETE 4+ VIEW; LEFT ANKLE COMPLETE - 3+ VIEW COMPARISON:  None. FINDINGS: There is mild soft tissue swelling about the patient's ankle without evidence for an acute displaced fracture or dislocation. There are degenerative changes of the ankle mortise. There is a moderate-sized plantar calcaneal spur. Vascular calcifications are noted. There are degenerative changes of the knee without evidence for significant joint effusion or acute displaced fracture. There is no acute osseous abnormality involving the patient's left femur. There is no hip dislocation. Mild degenerative changes are noted of the patient's left hip. Vascular calcifications are noted. IMPRESSION: 1. No acute displaced fracture or dislocation involving the patient's left lower extremity. 2. Mild soft tissue swelling about the ankle. 3. Degenerative changes of the knee and hip. Electronically Signed   By: Constance Holster M.D.   On: 12/02/2020 01:19   Assessment/Plan Active Problems:   Leg pain   Left  hip pain   Fibula fracture   Inability to bear weight   Diabetes mellitus without complication (HCC)   High cholesterol   Neuropathy   Essential hypertension   Hyperglycemia   AKI (acute kidney injury) ([place])   Normocytic anemia   Acute left fibular head fracture - nondisplaced, continue knee immobilizer for now and pain management as ordered. PT evaluation requested.  Ask for ortho consult in AM.   Inability to bear weight - likely multifactorial in setting of bilateral chronic avascular necrosis and acute fibular head fracture.  PT eval and will order wheelchair.   Left hip pain - CT hip with no acute findings. Follow up MRI of hip and lumbar spine for further investigation into the source of the pain.  Continue pain management as ordered.    Uncontrolled type 2 diabetes mellitus with neuropathy - as evidenced by A1c of 8.6%.  Continue carbohydrate modified diet.  Continue basal insulin with supplemental sliding scale coverage and prandial coverage.  Monitor CBG times.  Essential hypertension-resume home blood pressure lowering medication closely unit protocol.   Hyperlipidemia-resume home atorvastatin taking every evening.  Polyneuropathy needs mellitus resume gabapentin 100 mg 3 times daily.   DVT prophylaxis: enoxaparin  Code Status: Full   Family Communication: no family present   Disposition Plan: anticipating SNF   Consults called: PT  Admission status: OBS  Level of care: Med-Surg Irwin Brakeman MD [facility] How to contact the [facility] Attending or Consulting provider [place] or covering provider during after hours [place], for this patient?  1. Check the care team in [facility] and look for a) attending/consulting [facility] provider listed and b) the [facility] team listed 2. Log into [email or web address] and use [place]'s universal password to access. If you do not have the password, please contact the hospital operator. 3. Locate the [facility] provider you are looking for under [facility] and page to a number that you can be directly reached. 4. If you still have difficulty reaching the provider, please page the [facility] (Director on Call) for the Hospitalists listed on amion for assistance.   If 7PM-7AM, please contact night-coverage [email or web address] Password [facility]  12/02/2020, 7:21 AM

## 2020-12-02 NOTE — Evaluation (Signed)
Physical Therapy Evaluation Patient Details Name: [name] MRN: [id number] DOB: 05-31-1936 Today's Date: 12/02/2020   History of Present Illness  [name] is a 85 y.o. female.  Patient presents via EMS with left hip and leg pain after a fall this morning.  States she lost her balance will try to take her sock off and fell to the ground on her left side.  Did hit her head but did not lose consciousness.  Complains of pain to her left hip and left knee and left ankle.  She was seen in urgent care and told she had a questionable fracture in her ankle and placed in a fracture boot.  They did not x-ray her hip by her report.  He denies any preceding dizziness or lightheadedness.  No chest pain or shortness of breath.  Complains of pain in her left hip that radiates down her left leg involving her knee and ankle as well.  Denies any neck or back pain.  No blood thinner use.  No focal weakness, numbness or tingling    Clinical Impression  Patient demonstrates slow labored movement for sitting up at bedside requiring Mod assist to move LLE due to increased pain, at high risk for falls and limited to a few side steps at bedside with poor tolerance for weightbearing and advancing LLE due to pain/weakness.  Patient tolerated sitting up in chair after therapy - RN aware.  Patient will benefit from continued physical therapy in hospital and recommended venue below to increase strength, balance, endurance for safe ADLs and gait.      Follow Up Recommendations SNF    Equipment Recommendations  Rolling walker with 5" wheels    Recommendations for Other Services       Precautions / Restrictions Precautions Precautions: Fall Required Braces or Orthoses: Knee Immobilizer - Right Knee Immobilizer - Right: On when out of bed or walking Restrictions Weight Bearing Restrictions: No      Mobility  Bed Mobility Overal bed mobility: Needs Assistance Bed Mobility: Supine to Sit     Supine to  sit: Mod assist     General bed mobility comments: increased time, labored movement with assistance to move LLE and to pull self to sitting    Transfers Overall transfer level: Needs assistance Equipment used: Rolling walker (2 wheeled) Transfers: Sit to/from [facility] Sit to Stand: Mod assist Stand pivot transfers: Mod assist       General transfer comment: slow labored movement  Ambulation/Gait Ambulation/Gait assistance: Mod assist;Max assist Gait Distance (Feet): 4 Feet Assistive device: Rolling walker (2 wheeled) Gait Pattern/deviations: Decreased step length - right;Decreased step length - left;Decreased stance time - left;Decreased stride length;Antalgic Gait velocity: decreased   General Gait Details: limited to 4-5 slow labored side steps with diffiuclty advancing LLE due to increased pain  Stairs            Wheelchair Mobility    Modified Rankin (Stroke Patients Only)       Balance Overall balance assessment: Needs assistance Sitting-balance support: Feet supported;No upper extremity supported Sitting balance-Leahy Scale: Fair Sitting balance - Comments: fair/good seated at EOB   Standing balance support: During functional activity;Bilateral upper extremity supported Standing balance-Leahy Scale: Poor Standing balance comment: fair/poor using RW                             Pertinent Vitals/Pain Pain Assessment: 0-10 Pain Score: 7  Pain Location: left  knee with referral to calf up to hip Pain Descriptors / Indicators: Sore;Grimacing;Guarding Pain Intervention(s): Limited activity within patient's tolerance;Monitored during session    Home Living Family/patient expects to be discharged to:: Private residence Living Arrangements: Alone Available Help at Discharge: Family;Available PRN/intermittently Type of Home: Apartment Home Access: Level entry     Home Layout: One level Home Equipment: Cane - single point       Prior Function Level of Independence: Independent with assistive device(s)         Comments: household ambulator leaning on furniture, uses SPC for longer distances, drives, her daughter goes with her when shopping     Hand Dominance        Extremity/Trunk Assessment   Upper Extremity Assessment Upper Extremity Assessment: Generalized weakness    Lower Extremity Assessment Lower Extremity Assessment: Generalized weakness;LLE deficits/detail LLE Deficits / Details: grossly 3-/5 LLE: Unable to fully assess due to pain;Unable to fully assess due to immobilization LLE Sensation: WNL LLE Coordination: WNL    Cervical / Trunk Assessment Cervical / Trunk Assessment: Normal  Communication   Communication: No difficulties  Cognition Arousal/Alertness: Awake/alert Behavior During Therapy: WFL for tasks assessed/performed Overall Cognitive Status: Within Functional Limits for tasks assessed                                        General Comments      Exercises     Assessment/Plan    PT Assessment Patient needs continued PT services  PT Problem List Decreased strength;Decreased activity tolerance;Decreased balance;Decreased mobility       PT Treatment Interventions DME instruction;Gait training;Stair training;Functional mobility training;Therapeutic activities;Therapeutic exercise;Patient/family education;Balance training    PT Goals (Current goals can be found in the Care Plan section)  Acute Rehab PT Goals Patient Stated Goal: return home after rehab PT Goal Formulation: With patient Time For Goal Achievement: 12/16/20 Potential to Achieve Goals: Good    Frequency Min 3X/week   Barriers to discharge        Co-evaluation               AM-PAC PT "6 Clicks" Mobility  Outcome Measure Help needed turning from your back to your side while in a flat bed without using bedrails?: A Lot Help needed moving from lying on your back to sitting  on the side of a flat bed without using bedrails?: A Lot Help needed moving to and from a bed to a chair (including a wheelchair)?: A Lot Help needed standing up from a chair using your arms (e.g., wheelchair or bedside chair)?: A Lot Help needed to walk in hospital room?: A Lot Help needed climbing 3-5 steps with a railing? : Total 6 Click Score: 11    End of Session   Activity Tolerance: Patient tolerated treatment well;Patient limited by fatigue Patient left: in chair;with call bell/phone within reach Nurse Communication: Mobility status PT Visit Diagnosis: Unsteadiness on feet (R26.81);Muscle weakness (generalized) (M62.81);Other abnormalities of gait and mobility (R26.89)    Time: [id number] PT Time Calculation (min) (ACUTE ONLY): 26 min   Charges:   PT Evaluation $PT Eval Moderate Complexity: 1 Mod PT Treatments $Therapeutic Activity: 23-37 mins        4:00 PM, 12/02/20 Lonell Grandchild, MPT Physical Therapist with [facility] [phone] office [phone] mobile phone

## 2020-12-02 NOTE — Plan of Care (Signed)
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
## 2020-12-02 NOTE — Plan of Care (Signed)
  Problem: Acute Rehab PT Goals(only PT should resolve) Goal: Pt Will Go Supine/Side To Sit Outcome: Progressing Flowsheets (Taken 12/02/2020 1602) Pt will go Supine/Side to Sit: with minimal assist Goal: Patient Will Transfer Sit To/From Stand Outcome: Progressing Flowsheets (Taken 12/02/2020 1602) Patient will transfer sit to/from stand: with minimal assist Goal: Pt Will Transfer Bed To Chair/Chair To Bed Outcome: Progressing Flowsheets (Taken 12/02/2020 1602) Pt will Transfer Bed to Chair/Chair to Bed:  with min assist  with mod assist Goal: Pt Will Ambulate Outcome: Progressing Flowsheets (Taken 12/02/2020 1602) Pt will Ambulate:  25 feet  with minimal assist  with moderate assist  with rolling walker   4:02 PM, 12/02/20 Lonell Grandchild, MPT Physical Therapist with [facility] [phone] office [phone] mobile phone

## 2020-12-02 NOTE — ED Provider Notes (Signed)
[facility] EMERGENCY DEPARTMENT Provider Note   CSN: [id number] Arrival date & time: 12/01/20  2314     History Chief Complaint  Patient presents with  . Fall    [name] is a 85 y.o. female.  Patient presents via EMS with left hip and leg pain after a fall this morning.  States she lost her balance will try to take her sock off and fell to the ground on her left side.  Did hit her head but did not lose consciousness.  Complains of pain to her left hip and left knee and left ankle.  She was seen in urgent care and told she had a questionable fracture in her ankle and placed in a fracture boot.  They did not x-ray her hip by her report. He denies any preceding dizziness or lightheadedness.  No chest pain or shortness of breath.  Complains of pain in her left hip that radiates down her left leg involving her knee and ankle as well.  Denies any neck or back pain.  No blood thinner use.  No focal weakness, numbness or tingling  The history is provided by the patient and the EMS personnel.  Fall Associated symptoms include headaches. Pertinent negatives include no abdominal pain and no shortness of breath.       Past Medical History:  Diagnosis Date  . Diabetes mellitus without complication ([place])   . High cholesterol   . Neck pain   . Neuropathy     There are no problems to display for this patient.   Past Surgical History:  Procedure Laterality Date  . ABDOMINAL HYSTERECTOMY    . APPENDECTOMY    . BACK SURGERY    . CERVICAL DISC SURGERY    . HAND SURGERY    . KNEE SURGERY       OB History   No obstetric history on file.     Family History  Problem Relation Age of Onset  . Cardiomyopathy Father   . Cancer Mother     Social History   Tobacco Use  . Smoking status: Never Smoker  . Smokeless tobacco: Never Used  Vaping Use  . Vaping Use: Never used  Substance Use Topics  . Alcohol use: Never  . Drug use: Never    Home Medications Prior to  Admission medications   Medication Sig Start Date End Date Taking? Authorizing Provider  atorvastatin (LIPITOR) 20 MG tablet Take 20 mg by mouth daily.    [provider]  chlorthalidone (HYGROTON) 50 MG tablet Take by mouth daily.    [provider]  metFORMIN (GLUCOPHAGE) 500 MG tablet Take 500 mg by mouth 2 (two) times daily with a meal.    [provider]  Multiple Vitamin (MULTIVITAMIN WITH MINERALS) TABS tablet Take 1 tablet by mouth daily.    [provider]  pantoprazole (PROTONIX) 40 MG tablet Take 40 mg by mouth daily.    [provider]  psyllium (REGULOID) 0.52 g capsule Take 0.52 g by mouth daily.    [provider]  SUPER B COMPLEX/C PO Take 1 each by mouth.    [provider]    Allergies    Codeine  Review of Systems   Review of Systems  Constitutional: Negative for activity change, appetite change and fever.  HENT: Negative for congestion and rhinorrhea.   Respiratory: Negative for cough, chest tightness and shortness of breath.   Gastrointestinal: Negative for abdominal pain, nausea and vomiting.  Genitourinary:  Negative for dysuria and hematuria.  Musculoskeletal: Positive for arthralgias and myalgias.  Neurological: Positive for headaches. Negative for dizziness and weakness.   all other systems are negative except as noted in the HPI and PMH.    Physical Exam Updated Vital Signs BP (!) 112/96 (BP Location: Left Arm)   Pulse 78   Temp 98.5 F (36.9 C) (Oral)   Resp 17   Ht '5\' 2"'$  (1.575 m)   Wt 74.8 kg   SpO2 100%   BMI 30.18 kg/m   Physical Exam Vitals and nursing note reviewed.  Constitutional:      General: She is not in acute distress.    Appearance: She is well-developed.  HENT:     Head: Normocephalic and atraumatic.     Mouth/Throat:     Pharynx: No oropharyngeal exudate.  Eyes:     Conjunctiva/sclera: Conjunctivae normal.     Pupils: Pupils are equal, round, and reactive to  light.  Neck:     Comments: No meningismus. Cardiovascular:     Rate and Rhythm: Normal rate and regular rhythm.     Heart sounds: Normal heart sounds. No murmur heard.   Pulmonary:     Effort: Pulmonary effort is normal. No respiratory distress.     Breath sounds: Normal breath sounds.  Abdominal:     Palpations: Abdomen is soft.     Tenderness: There is no abdominal tenderness. There is no guarding or rebound.  Musculoskeletal:        General: Tenderness present.     Cervical back: Normal range of motion and neck supple.     Comments: No gross deformity to left lower leg.  No shortening or external rotation.  Intact DP and PT pulses.  No pain to palpation of the ankle.  Diffuse pain involving proximal tibia and fibula and knee.  Pain with flexion and extension of the knee.  Pain with attempted movement of the left hip.  No lumbar spine tenderness Pelvis stable.  Skin:    General: Skin is warm.  Neurological:     Mental Status: She is alert and oriented to person, place, and time.     Cranial Nerves: No cranial nerve deficit.     Motor: No abnormal muscle tone.     Coordination: Coordination normal.     Comments:  5/5 strength throughout. CN 2-12 intact.Equal grip strength.   Psychiatric:        Behavior: Behavior normal.     ED Results / Procedures / Treatments   Labs (all labs ordered are listed, but only abnormal results are displayed) Labs Reviewed  CBC WITH DIFFERENTIAL/PLATELET - Abnormal; Notable for the following components:      Result Value   RBC 3.70 (*)    Hemoglobin 11.3 (*)    HCT 34.4 (*)    All other components within normal limits  BASIC METABOLIC PANEL - Abnormal; Notable for the following components:   Glucose, Bld 209 (*)    BUN 33 (*)    Creatinine, Ser 1.13 (*)    GFR, Estimated 49 (*)    All other components within normal limits  RESP PANEL BY RT-PCR (FLU A&B, COVID) [id number]  HEMOGLOBIN A1C    EKG None  Radiology DG Tibia/Fibula  Left  Result Date: 12/02/2020 CLINICAL DATA:  Pain status post fall. EXAM: LEFT TIBIA AND FIBULA - 2 VIEW; LEFT FEMUR 2 VIEWS; LEFT KNEE - COMPLETE 4+ VIEW; LEFT ANKLE COMPLETE - 3+ VIEW COMPARISON:  None. FINDINGS: There  is mild soft tissue swelling about the patient's ankle without evidence for an acute displaced fracture or dislocation. There are degenerative changes of the ankle mortise. There is a moderate-sized plantar calcaneal spur. Vascular calcifications are noted. There are degenerative changes of the knee without evidence for significant joint effusion or acute displaced fracture. There is no acute osseous abnormality involving the patient's left femur. There is no hip dislocation. Mild degenerative changes are noted of the patient's left hip. Vascular calcifications are noted. IMPRESSION: 1. No acute displaced fracture or dislocation involving the patient's left lower extremity. 2. Mild soft tissue swelling about the ankle. 3. Degenerative changes of the knee and hip. Electronically Signed   By: Constance Holster M.D.   On: 12/02/2020 01:19   DG Ankle Complete Left  Result Date: 12/02/2020 CLINICAL DATA:  Pain status post fall. EXAM: LEFT TIBIA AND FIBULA - 2 VIEW; LEFT FEMUR 2 VIEWS; LEFT KNEE - COMPLETE 4+ VIEW; LEFT ANKLE COMPLETE - 3+ VIEW COMPARISON:  None. FINDINGS: There is mild soft tissue swelling about the patient's ankle without evidence for an acute displaced fracture or dislocation. There are degenerative changes of the ankle mortise. There is a moderate-sized plantar calcaneal spur. Vascular calcifications are noted. There are degenerative changes of the knee without evidence for significant joint effusion or acute displaced fracture. There is no acute osseous abnormality involving the patient's left femur. There is no hip dislocation. Mild degenerative changes are noted of the patient's left hip. Vascular calcifications are noted. IMPRESSION: 1. No acute displaced fracture or  dislocation involving the patient's left lower extremity. 2. Mild soft tissue swelling about the ankle. 3. Degenerative changes of the knee and hip. Electronically Signed   By: Constance Holster M.D.   On: 12/02/2020 01:19   CT Head Wo Contrast  Result Date: 12/02/2020 CLINICAL DATA:  Fall EXAM: CT HEAD WITHOUT CONTRAST TECHNIQUE: Contiguous axial images were obtained from the base of the skull through the vertex without intravenous contrast. COMPARISON:  08/20/2019 FINDINGS: Brain: No acute intracranial abnormality. Specifically, no hemorrhage, hydrocephalus, mass lesion, acute infarction, or significant intracranial injury. Vascular: No hyperdense vessel or unexpected calcification. Skull: No acute calvarial abnormality. Sinuses/Orbits: Polypoid filling defects in both maxillary sinuses. No air-fluid levels. Other: None IMPRESSION: No acute intracranial abnormality. Polypoid sinus disease. Electronically Signed   By: Rolm Baptise M.D.   On: 12/02/2020 01:45   CT Cervical Spine Wo Contrast  Result Date: 12/02/2020 CLINICAL DATA:  Fall EXAM: CT CERVICAL SPINE WITHOUT CONTRAST TECHNIQUE: Multidetector CT imaging of the cervical spine was performed without intravenous contrast. Multiplanar CT image reconstructions were also generated. COMPARISON:  08/20/2019 FINDINGS: Alignment: No subluxation. Skull base and vertebrae: No acute fracture. No primary bone lesion or focal pathologic process. Soft tissues and spinal canal: No prevertebral fluid or swelling. No visible canal hematoma. Disc levels: Prior anterior fusion from C4-C6. Advanced degenerative disc disease at C3-4 and C6-7. Mild bilateral diffuse degenerative facet disease. Upper chest: Biapical scarring. Other: None IMPRESSION: Postoperative and degenerative changes in the cervical spine. No acute bony abnormality. Electronically Signed   By: Rolm Baptise M.D.   On: 12/02/2020 01:46   CT PELVIS WO CONTRAST  Result Date: 12/02/2020 CLINICAL DATA:  Left  hip pain after a fall on 11/19/2020. EXAM: CT PELVIS WITHOUT CONTRAST TECHNIQUE: Multidetector CT imaging of the pelvis was performed following the standard protocol without intravenous contrast. COMPARISON:  CT abdomen and pelvis 10/11/2018 FINDINGS: Urinary Tract:  No bladder wall thickening or filling defect.  Bowel: Visualized portions of large and small bowel are not abnormally distended. Scattered stool in the colon. Sigmoid colonic diverticula without evidence of diverticulitis. Appendix is not identified. Vascular/Lymphatic: Vascular calcifications in the iliac arteries. No aneurysm. Reproductive: Surgical absence of the uterus. No abnormal adnexal masses. Other: No free fluid in the pelvis. No significant pelvic lymphadenopathy. Musculoskeletal: Degenerative changes in the lower lumbar spine. 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 No evidence of acute fracture or dislocation of the pelvis or hips. Sacrum appears intact. Degenerative changes in the hips with subcortical cysts on the left hip. IMPRESSION: 1. No evidence of acute fracture or dislocation of the pelvis or hips. 2. 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 3. Degenerative changes in the hips with subcortical cysts on the left hip. Electronically Signed   By: Lucienne Capers M.D.   On: 12/02/2020 01:50   CT Knee Left Wo Contrast  Result Date: 12/02/2020 CLINICAL DATA:  Knee pain EXAM: CT OF THE LEFT KNEE WITHOUT CONTRAST TECHNIQUE: Multidetector CT imaging of the LEFT knee was performed according to the standard protocol. Multiplanar CT image reconstructions were also generated. COMPARISON:  None. FINDINGS: Bones/Joint/Cartilage There is a subtle acute nondisplaced fracture through the medial aspect of the head of the fibula (axial series 4, image 99). 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 There are intra-articular loose bodies.  Ligaments Suboptimally assessed by CT. Muscles and Tendons There is acute intramuscular abnormality. Soft tissues There is a small joint effusion. IMPRESSION: 1. Subtle acute nondisplaced fracture through the medial aspect of the head of the fibula. 2. 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 3. There is a small joint effusion. Electronically Signed   By: Constance Holster M.D.   On: 12/02/2020 03:34   DG Knee Complete 4 Views Left  Result Date: 12/02/2020 CLINICAL DATA:  Pain status post fall. EXAM: LEFT TIBIA AND FIBULA - 2 VIEW; LEFT FEMUR 2 VIEWS; LEFT KNEE - COMPLETE 4+ VIEW; LEFT ANKLE COMPLETE - 3+ VIEW COMPARISON:  None. FINDINGS: There is mild soft tissue swelling about the patient's ankle without evidence for an acute displaced fracture or dislocation. There are degenerative changes of the ankle mortise. There is a moderate-sized plantar calcaneal spur. Vascular calcifications are noted. There are degenerative changes of the knee without evidence for significant joint effusion or acute displaced fracture. There is no acute osseous abnormality involving the patient's left femur. There is no hip dislocation. Mild degenerative changes are noted of the patient's left hip. Vascular calcifications are noted. IMPRESSION: 1. No acute displaced fracture or dislocation involving the patient's left lower extremity. 2. Mild soft tissue swelling about the ankle. 3. Degenerative changes of the knee and hip. Electronically Signed   By: Constance Holster M.D.   On: 12/02/2020 01:19   DG Femur Min 2 Views Left  Result Date: 12/02/2020 CLINICAL DATA:  Pain status post fall. EXAM: LEFT TIBIA AND FIBULA - 2 VIEW; LEFT FEMUR 2 VIEWS; LEFT KNEE - COMPLETE 4+ VIEW; LEFT ANKLE COMPLETE - 3+ VIEW COMPARISON:  None. FINDINGS: There is mild soft tissue swelling about the patient's ankle without evidence for an acute displaced fracture or dislocation. There are degenerative changes of the ankle  mortise. There is a moderate-sized plantar calcaneal spur. Vascular calcifications are noted. There are degenerative changes of the knee without evidence for significant joint effusion or acute displaced fracture. There is no acute osseous abnormality involving the patient's left femur. There is no hip  dislocation. Mild degenerative changes are noted of the patient's left hip. Vascular calcifications are noted. IMPRESSION: 1. No acute displaced fracture or dislocation involving the patient's left lower extremity. 2. Mild soft tissue swelling about the ankle. 3. Degenerative changes of the knee and hip. Electronically Signed   By: Constance Holster M.D.   On: 12/02/2020 01:19    Procedures Procedures   Medications Ordered in ED Medications  HYDROcodone-acetaminophen (NORCO/VICODIN) 5-325 MG per tablet 1 tablet (has no administration in time range)    ED Course  I have reviewed the triage vital signs and the nursing notes.  Pertinent labs & imaging results that were available during my care of the patient were reviewed by me and considered in my medical decision making (see chart for details).    MDM Rules/Calculators/A&P                         Fall with L leg and hip pain. No blood thinner.  Neurovascular intact.  Equal distal pulses. States she did hit her head but no loss of consciousness. Plain films in urgent care were apparently inconclusive.  Films of hip, knee and ankle are negative.  CT pelvis is negative.  Head and C-spine are negative.  Patient still with significant pain in inability to stand or bear weight.  She describes pain radiating down her entire left leg involving both the hip and the knee. Compartments are soft and she is neurovascularly intact.  Concern for occult fracture involving the hip or knee.  CT knee shows subtle nondisplaced fibular head fracture.  Patient placed in the immobilizer.  Distal and apical bear weight or stand which is a significant change  from her baseline of independence.  She will need admission for inability to bear weight and further evaluation.  MRI of hip and lumbar spine will be ordered. She will likely need evaluation by PT and OT.  Admission discussed with Dr. Clearence Ped.  Final Clinical Impression(s) / ED Diagnoses Final diagnoses:  Fall, initial encounter  Closed fracture of proximal end of left fibula, unspecified fracture morphology, initial encounter    Rx / DC Orders ED Discharge Orders    None       Dalbert Stillings, Annie Main, MD 12/02/20 [phone]

## 2020-12-03 DIAGNOSIS — M25552 Pain in left hip: Secondary | ICD-10-CM | POA: Diagnosis present

## 2020-12-03 DIAGNOSIS — M5410 Radiculopathy, site unspecified: Secondary | ICD-10-CM | POA: Diagnosis not present

## 2020-12-03 DIAGNOSIS — W010XXA Fall on same level from slipping, tripping and stumbling without subsequent striking against object, initial encounter: Secondary | ICD-10-CM | POA: Diagnosis present

## 2020-12-03 DIAGNOSIS — R296 Repeated falls: Secondary | ICD-10-CM | POA: Diagnosis present

## 2020-12-03 DIAGNOSIS — E7800 Pure hypercholesterolemia, unspecified: Secondary | ICD-10-CM | POA: Diagnosis present

## 2020-12-03 DIAGNOSIS — Z7984 Long term (current) use of oral hypoglycemic drugs: Secondary | ICD-10-CM | POA: Diagnosis not present

## 2020-12-03 DIAGNOSIS — E785 Hyperlipidemia, unspecified: Secondary | ICD-10-CM | POA: Diagnosis present

## 2020-12-03 DIAGNOSIS — Z8249 Family history of ischemic heart disease and other diseases of the circulatory system: Secondary | ICD-10-CM | POA: Diagnosis not present

## 2020-12-03 DIAGNOSIS — S82832A Other fracture of upper and lower end of left fibula, initial encounter for closed fracture: Secondary | ICD-10-CM | POA: Diagnosis present

## 2020-12-03 DIAGNOSIS — I10 Essential (primary) hypertension: Secondary | ICD-10-CM | POA: Diagnosis not present

## 2020-12-03 DIAGNOSIS — D649 Anemia, unspecified: Secondary | ICD-10-CM | POA: Diagnosis present

## 2020-12-03 DIAGNOSIS — Z885 Allergy status to narcotic agent status: Secondary | ICD-10-CM | POA: Diagnosis not present

## 2020-12-03 DIAGNOSIS — Z90710 Acquired absence of both cervix and uterus: Secondary | ICD-10-CM | POA: Diagnosis not present

## 2020-12-03 DIAGNOSIS — E119 Type 2 diabetes mellitus without complications: Secondary | ICD-10-CM | POA: Diagnosis not present

## 2020-12-03 DIAGNOSIS — Y92009 Unspecified place in unspecified non-institutional (private) residence as the place of occurrence of the external cause: Secondary | ICD-10-CM | POA: Diagnosis not present

## 2020-12-03 DIAGNOSIS — Z79899 Other long term (current) drug therapy: Secondary | ICD-10-CM | POA: Diagnosis not present

## 2020-12-03 DIAGNOSIS — R2689 Other abnormalities of gait and mobility: Secondary | ICD-10-CM | POA: Diagnosis not present

## 2020-12-03 DIAGNOSIS — M5117 Intervertebral disc disorders with radiculopathy, lumbosacral region: Secondary | ICD-10-CM | POA: Diagnosis present

## 2020-12-03 DIAGNOSIS — E1142 Type 2 diabetes mellitus with diabetic polyneuropathy: Secondary | ICD-10-CM | POA: Diagnosis present

## 2020-12-03 DIAGNOSIS — R52 Pain, unspecified: Secondary | ICD-10-CM | POA: Diagnosis present

## 2020-12-03 DIAGNOSIS — N179 Acute kidney failure, unspecified: Secondary | ICD-10-CM | POA: Diagnosis present

## 2020-12-03 DIAGNOSIS — M4807 Spinal stenosis, lumbosacral region: Secondary | ICD-10-CM | POA: Diagnosis present

## 2020-12-03 DIAGNOSIS — M1712 Unilateral primary osteoarthritis, left knee: Secondary | ICD-10-CM | POA: Diagnosis present

## 2020-12-03 DIAGNOSIS — M879 Osteonecrosis, unspecified: Secondary | ICD-10-CM | POA: Diagnosis present

## 2020-12-03 DIAGNOSIS — E1165 Type 2 diabetes mellitus with hyperglycemia: Secondary | ICD-10-CM | POA: Diagnosis present

## 2020-12-03 DIAGNOSIS — R739 Hyperglycemia, unspecified: Secondary | ICD-10-CM | POA: Diagnosis not present

## 2020-12-03 DIAGNOSIS — Z20822 Contact with and (suspected) exposure to covid-19: Secondary | ICD-10-CM | POA: Diagnosis present

## 2020-12-03 LAB — BASIC METABOLIC PANEL
Anion gap: 10 (ref 5–15)
BUN: 41 mg/dL — ABNORMAL HIGH (ref 8–23)
CO2: 21 mmol/L — ABNORMAL LOW (ref 22–32)
Calcium: 8.2 mg/dL — ABNORMAL LOW (ref 8.9–10.3)
Chloride: 103 mmol/L (ref 98–111)
Creatinine, Ser: 1.49 mg/dL — ABNORMAL HIGH (ref 0.44–1.00)
GFR, Estimated: 35 mL/min — ABNORMAL LOW (ref >60)
Glucose, Bld: 188 mg/dL — ABNORMAL HIGH (ref 70–99)
Potassium: 4.4 mmol/L (ref 3.5–5.1)
Sodium: 134 mmol/L — ABNORMAL LOW (ref 135–145)

## 2020-12-03 LAB — CBC
HCT: 30.5 % — ABNORMAL LOW (ref 36.0–46.0)
Hemoglobin: 9.8 g/dL — ABNORMAL LOW (ref 12.0–15.0)
MCH: 30.2 pg (ref 26.0–34.0)
MCHC: 32.1 g/dL (ref 30.0–36.0)
MCV: 93.8 fL (ref 80.0–100.0)
Platelets: 182 10*3/uL (ref 150–400)
RBC: 3.25 MIL/uL — ABNORMAL LOW (ref 3.87–5.11)
RDW: 13.5 % (ref 11.5–15.5)
WBC: 6.5 10*3/uL (ref 4.0–10.5)
nRBC: 0 % (ref 0.0–0.2)

## 2020-12-03 LAB — GLUCOSE, CAPILLARY
Glucose-Capillary: 178 mg/dL — ABNORMAL HIGH (ref 70–99)
Glucose-Capillary: 128 mg/dL — ABNORMAL HIGH (ref 70–99)
Glucose-Capillary: 233 mg/dL — ABNORMAL HIGH (ref 70–99)
Glucose-Capillary: 190 mg/dL — ABNORMAL HIGH (ref 70–99)
Glucose-Capillary: 308 mg/dL — ABNORMAL HIGH (ref 70–99)

## 2020-12-03 MED ORDER — INSULIN ASPART 100 UNIT/ML ~~LOC~~ SOLN
10.0000 [IU] | Freq: Three times a day (TID) | SUBCUTANEOUS | Status: DC
  Administered 2020-12-03 – 2020-12-04 (×3): 10 [IU] via SUBCUTANEOUS

## 2020-12-03 MED ORDER — INSULIN ASPART 100 UNIT/ML ~~LOC~~ SOLN
0.0000 [IU] | Freq: Three times a day (TID) | SUBCUTANEOUS | Status: DC
  Administered 2020-12-03: 4 [IU] via SUBCUTANEOUS
  Administered 2020-12-04 (×2): 11 [IU] via SUBCUTANEOUS

## 2020-12-03 MED ORDER — SODIUM CHLORIDE 0.9 % IV SOLN: INTRAVENOUS | Status: AC

## 2020-12-03 MED ORDER — INSULIN ASPART 100 UNIT/ML ~~LOC~~ SOLN
0.0000 [IU] | Freq: Every day | SUBCUTANEOUS | Status: DC
Start: 2020-12-03 — End: 2020-12-04
  Administered 2020-12-03: 4 [IU] via SUBCUTANEOUS

## 2020-12-03 MED ORDER — GABAPENTIN 100 MG PO CAPS
200.0000 mg | ORAL_CAPSULE | Freq: Three times a day (TID) | ORAL | Status: DC
  Administered 2020-12-03 – 2020-12-04 (×3): 200 mg via ORAL
  Filled 2020-12-03 (×3): qty 2

## 2020-12-03 MED ORDER — OXYCODONE HCL 5 MG PO TABS
5.0000 mg | ORAL_TABLET | Freq: Four times a day (QID) | ORAL | Status: DC | PRN
Start: 2020-12-03 — End: 2020-12-04
  Administered 2020-12-04: 5 mg via ORAL
  Filled 2020-12-03: qty 1

## 2020-12-03 MED ORDER — ENOXAPARIN SODIUM 30 MG/0.3ML ~~LOC~~ SOLN
30.0000 mg | SUBCUTANEOUS | Status: DC
  Administered 2020-12-03: 30 mg via SUBCUTANEOUS
  Filled 2020-12-03: qty 0.3

## 2020-12-03 MED ORDER — METHYLPREDNISOLONE SODIUM SUCC 40 MG IJ SOLR
40.0000 mg | Freq: Four times a day (QID) | INTRAMUSCULAR | Status: AC
  Administered 2020-12-03 – 2020-12-04 (×4): 40 mg via INTRAVENOUS
  Filled 2020-12-03 (×4): qty 1

## 2020-12-03 NOTE — Consult Note (Addendum)
Reason for Consult: Left leg pain questionable etiology Referring Physician: Dr. Irwin Brakeman  [name] is an 85 y.o. female.  HPI: This is an 85 year old female status post L5-S1 hemilaminectomy years ago, has a history of neuropathy frequent falls presented to urgent care a few days ago with leg pain after falling presented to our ER subsequent to that with same complaints  She had a extensive work-up in the ER the only fracture that was seen was a fibular head nondisplaced fracture which was only seen on CT scan  Patient planes of severe pain in her left leg.  Her history is very clouded by the fact that she says that her knee hurts until you question her further and then she will say that her leg hurts.  She has a numb-like feeling in her left leg starts in the knee and radiates all the way into the foot.  She denies any back pain but does have pain behind the leg on the left side.  She is diabetic  She uses a cane and a walker at home  She is allergic to codeine  Past Medical History:  Diagnosis Date  . Diabetes mellitus without complication ([place])   . High cholesterol   . Neck pain   . Neuropathy     Past Surgical History:  Procedure Laterality Date  . ABDOMINAL HYSTERECTOMY    . APPENDECTOMY    . BACK SURGERY    . CERVICAL DISC SURGERY    . HAND SURGERY    . KNEE SURGERY      Family History  Problem Relation Age of Onset  . Cardiomyopathy Father   . Cancer Mother     Social History:  reports that she has never smoked. She has never used smokeless tobacco. She reports that she does not drink alcohol and does not use drugs.  Allergies:  Allergies  Allergen Reactions  . Codeine     Medications: I have reviewed the patient's current medications.  Results for orders placed or performed during the hospital encounter of 12/01/20 (from the past 48 hour(s))  CBC with Differential/Platelet     Status: Abnormal   Collection Time: 12/02/20  4:54 AM  Result  Value Ref Range   WBC 6.4 4.0 - 10.5 K/uL   RBC 3.70 (L) 3.87 - 5.11 MIL/uL   Hemoglobin 11.3 (L) 12.0 - 15.0 g/dL   HCT 34.4 (L) 36.0 - 46.0 %   MCV 93.0 80.0 - 100.0 fL   MCH 30.5 26.0 - 34.0 pg   MCHC 32.8 30.0 - 36.0 g/dL   RDW 13.7 11.5 - 15.5 %   Platelets 216 150 - 400 K/uL   nRBC 0.0 0.0 - 0.2 %   Neutrophils Relative % 50 %   Neutro Abs 3.2 1.7 - 7.7 K/uL   Lymphocytes Relative 40 %   Lymphs Abs 2.6 0.7 - 4.0 K/uL   Monocytes Relative 8 %   Monocytes Absolute 0.5 0.1 - 1.0 K/uL   Eosinophils Relative 2 %   Eosinophils Absolute 0.1 0.0 - 0.5 K/uL   Basophils Relative 0 %   Basophils Absolute 0.0 0.0 - 0.1 K/uL   Immature Granulocytes 0 %   Abs Immature Granulocytes 0.01 0.00 - 0.07 K/uL    Comment: Performed at [facility], [address], [place], [place] [id number]  Basic metabolic panel     Status: Abnormal   Collection Time: 12/02/20  4:54 AM  Result Value Ref Range   Sodium 137  135 - 145 mmol/L   Potassium 4.3 3.5 - 5.1 mmol/L   Chloride 104 98 - 111 mmol/L   CO2 23 22 - 32 mmol/L   Glucose, Bld 209 (H) 70 - 99 mg/dL    Comment: Glucose reference range applies only to samples taken after fasting for at least 8 hours.   BUN 33 (H) 8 - 23 mg/dL   Creatinine, Ser 1.13 (H) 0.44 - 1.00 mg/dL   Calcium 9.0 8.9 - 10.3 mg/dL   GFR, Estimated 49 (L) >60 mL/min    Comment: (NOTE) Calculated using the CKD-EPI Creatinine Equation (2021)    Anion gap 10 5 - 15    Comment: Performed at [facility], [address], [place], [place] [address]  Resp Panel by RT-PCR (Flu A&B, Covid) Nasopharyngeal Swab     Status: None   Collection Time: 12/02/20  5:00 AM   Specimen: Nasopharyngeal Swab; Nasopharyngeal(NP) swabs in vial transport medium  Result Value Ref Range   SARS Coronavirus 2 by RT PCR NEGATIVE NEGATIVE    Comment: (NOTE) SARS-CoV-2 target nucleic acids are NOT DETECTED.  The SARS-CoV-2 RNA is generally detectable in upper respiratory specimens during the acute phase  of infection. The lowest concentration of SARS-CoV-2 viral copies this assay can detect is 138 copies/mL. A negative result does not preclude SARS-Cov-2 infection and should not be used as the sole basis for treatment or other patient management decisions. A negative result may occur with  improper specimen collection/handling, submission of specimen other than nasopharyngeal swab, presence of viral mutation(s) within the areas targeted by this assay, and inadequate number of viral copies(<138 copies/mL). A negative result must be combined with clinical observations, patient history, and epidemiological information. The expected result is Negative.  Fact Sheet for Patients:  EntrepreneurPulse.com.au  Fact Sheet for Healthcare Providers:  IncredibleEmployment.be  This test is no t yet approved or cleared by the [place] FDA and  has been authorized for detection and/or diagnosis of SARS-CoV-2 by FDA under an Emergency Use Authorization (EUA). This EUA will remain  in effect (meaning this test can be used) for the duration of the COVID-19 declaration under Section 564(b)(1) of the Act, 21 U.S.C.section 360bbb-3(b)(1), unless the authorization is terminated  or revoked sooner.       Influenza A by PCR NEGATIVE NEGATIVE   Influenza B by PCR NEGATIVE NEGATIVE    Comment: (NOTE) The Xpert Xpress SARS-CoV-2/FLU/RSV plus assay is intended as an aid in the diagnosis of influenza from Nasopharyngeal swab specimens and should not be used as a sole basis for treatment. Nasal washings and aspirates are unacceptable for Xpert Xpress SARS-CoV-2/FLU/RSV testing.  Fact Sheet for Patients: EntrepreneurPulse.com.au  Fact Sheet for Healthcare Providers: IncredibleEmployment.be  This test is not yet approved or cleared by the [place] FDA and has been authorized for detection and/or diagnosis of SARS-CoV-2 by FDA  under an Emergency Use Authorization (EUA). This EUA will remain in effect (meaning this test can be used) for the duration of the COVID-19 declaration under Section 564(b)(1) of the Act, 21 U.S.C. section 360bbb-3(b)(1), unless the authorization is terminated or revoked.  Performed at [facility], [address], [place], [place] [address]   Hemoglobin A1c     Status: Abnormal   Collection Time: 12/02/20  7:11 AM  Result Value Ref Range   Hgb A1c MFr Bld 8.6 (H) 4.8 - 5.6 %    Comment: (NOTE) Pre diabetes:          5.7%-6.4%  Diabetes:              >6.4%  Glycemic control for   <7.0% adults with diabetes    Mean Plasma Glucose 200.12 mg/dL    Comment: Performed at [facility] [address], [place], [place] [address]  CBG monitoring, ED     Status: Abnormal   Collection Time: 12/02/20  9:52 AM  Result Value Ref Range   Glucose-Capillary 245 (H) 70 - 99 mg/dL    Comment: Glucose reference range applies only to samples taken after fasting for at least 8 hours.  Glucose, capillary     Status: Abnormal   Collection Time: 12/02/20 11:00 AM  Result Value Ref Range   Glucose-Capillary 231 (H) 70 - 99 mg/dL    Comment: Glucose reference range applies only to samples taken after fasting for at least 8 hours.   Comment 1 Notify RN   Glucose, capillary     Status: Abnormal   Collection Time: 12/02/20  4:11 PM  Result Value Ref Range   Glucose-Capillary 109 (H) 70 - 99 mg/dL    Comment: Glucose reference range applies only to samples taken after fasting for at least 8 hours.  Glucose, capillary     Status: None   Collection Time: 12/02/20  7:59 PM  Result Value Ref Range   Glucose-Capillary 89 70 - 99 mg/dL    Comment: Glucose reference range applies only to samples taken after fasting for at least 8 hours.   Comment 1 Notify RN    Comment 2 Document in Chart   Glucose, capillary     Status: Abnormal   Collection Time: 12/03/20  3:12 AM  Result Value Ref Range    Glucose-Capillary 178 (H) 70 - 99 mg/dL    Comment: Glucose reference range applies only to samples taken after fasting for at least 8 hours.   Comment 1 Notify RN    Comment 2 Document in Chart   CBC     Status: Abnormal   Collection Time: 12/03/20  4:07 AM  Result Value Ref Range   WBC 6.5 4.0 - 10.5 K/uL   RBC 3.25 (L) 3.87 - 5.11 MIL/uL   Hemoglobin 9.8 (L) 12.0 - 15.0 g/dL   HCT 30.5 (L) 36.0 - 46.0 %   MCV 93.8 80.0 - 100.0 fL   MCH 30.2 26.0 - 34.0 pg   MCHC 32.1 30.0 - 36.0 g/dL   RDW 13.5 11.5 - 15.5 %   Platelets 182 150 - 400 K/uL   nRBC 0.0 0.0 - 0.2 %    Comment: Performed at [facility], [address], [place], [place] [id number]  Basic metabolic panel     Status: Abnormal   Collection Time: 12/03/20  4:07 AM  Result Value Ref Range   Sodium 134 (L) 135 - 145 mmol/L   Potassium 4.4 3.5 - 5.1 mmol/L   Chloride 103 98 - 111 mmol/L   CO2 21 (L) 22 - 32 mmol/L   Glucose, Bld 188 (H) 70 - 99 mg/dL    Comment: Glucose reference range applies only to samples taken after fasting for at least 8 hours.   BUN 41 (H) 8 - 23 mg/dL   Creatinine, Ser 1.49 (H) 0.44 - 1.00 mg/dL   Calcium 8.2 (L) 8.9 - 10.3 mg/dL   GFR, Estimated 35 (L) >60 mL/min    Comment: (NOTE) Calculated using the CKD-EPI Creatinine Equation (2021)    Anion gap 10 5 - 15  Comment: Performed at [facility], [address] [place], [place] [address]  Glucose, capillary     Status: Abnormal   Collection Time: 12/03/20  7:51 AM  Result Value Ref Range   Glucose-Capillary 128 (H) 70 - 99 mg/dL    Comment: Glucose reference range applies only to samples taken after fasting for at least 8 hours.  Glucose, capillary     Status: Abnormal   Collection Time: 12/03/20 11:56 AM  Result Value Ref Range   Glucose-Capillary 233 (H) 70 - 99 mg/dL    Comment: Glucose reference range applies only to samples taken after fasting for at least 8 hours.    DG Tibia/Fibula Left  Result Date: 12/02/2020 CLINICAL DATA:   Pain status post fall. EXAM: LEFT TIBIA AND FIBULA - 2 VIEW; LEFT FEMUR 2 VIEWS; LEFT KNEE - COMPLETE 4+ VIEW; LEFT ANKLE COMPLETE - 3+ VIEW COMPARISON:  None. FINDINGS: There is mild soft tissue swelling about the patient's ankle without evidence for an acute displaced fracture or dislocation. There are degenerative changes of the ankle mortise. There is a moderate-sized plantar calcaneal spur. Vascular calcifications are noted. There are degenerative changes of the knee without evidence for significant joint effusion or acute displaced fracture. There is no acute osseous abnormality involving the patient's left femur. There is no hip dislocation. Mild degenerative changes are noted of the patient's left hip. Vascular calcifications are noted. IMPRESSION: 1. No acute displaced fracture or dislocation involving the patient's left lower extremity. 2. Mild soft tissue swelling about the ankle. 3. Degenerative changes of the knee and hip. Electronically Signed   By: Constance Holster M.D.   On: 12/02/2020 01:19   DG Ankle Complete Left  Result Date: 12/02/2020 CLINICAL DATA:  Pain status post fall. EXAM: LEFT TIBIA AND FIBULA - 2 VIEW; LEFT FEMUR 2 VIEWS; LEFT KNEE - COMPLETE 4+ VIEW; LEFT ANKLE COMPLETE - 3+ VIEW COMPARISON:  None. FINDINGS: There is mild soft tissue swelling about the patient's ankle without evidence for an acute displaced fracture or dislocation. There are degenerative changes of the ankle mortise. There is a moderate-sized plantar calcaneal spur. Vascular calcifications are noted. There are degenerative changes of the knee without evidence for significant joint effusion or acute displaced fracture. There is no acute osseous abnormality involving the patient's left femur. There is no hip dislocation. Mild degenerative changes are noted of the patient's left hip. Vascular calcifications are noted. IMPRESSION: 1. No acute displaced fracture or dislocation involving the patient's left lower  extremity. 2. Mild soft tissue swelling about the ankle. 3. Degenerative changes of the knee and hip. Electronically Signed   By: Constance Holster M.D.   On: 12/02/2020 01:19   CT Head Wo Contrast  Result Date: 12/02/2020 CLINICAL DATA:  Fall EXAM: CT HEAD WITHOUT CONTRAST TECHNIQUE: Contiguous axial images were obtained from the base of the skull through the vertex without intravenous contrast. COMPARISON:  08/20/2019 FINDINGS: Brain: No acute intracranial abnormality. Specifically, no hemorrhage, hydrocephalus, mass lesion, acute infarction, or significant intracranial injury. Vascular: No hyperdense vessel or unexpected calcification. Skull: No acute calvarial abnormality. Sinuses/Orbits: Polypoid filling defects in both maxillary sinuses. No air-fluid levels. Other: None IMPRESSION: No acute intracranial abnormality. Polypoid sinus disease. Electronically Signed   By: Rolm Baptise M.D.   On: 12/02/2020 01:45   CT Cervical Spine Wo Contrast  Result Date: 12/02/2020 CLINICAL DATA:  Fall EXAM: CT CERVICAL SPINE WITHOUT CONTRAST TECHNIQUE: Multidetector CT imaging of the cervical spine was performed without intravenous contrast.  Multiplanar CT image reconstructions were also generated. COMPARISON:  08/20/2019 FINDINGS: Alignment: No subluxation. Skull base and vertebrae: No acute fracture. No primary bone lesion or focal pathologic process. Soft tissues and spinal canal: No prevertebral fluid or swelling. No visible canal hematoma. Disc levels: Prior anterior fusion from C4-C6. Advanced degenerative disc disease at C3-4 and C6-7. Mild bilateral diffuse degenerative facet disease. Upper chest: Biapical scarring. Other: None IMPRESSION: Postoperative and degenerative changes in the cervical spine. No acute bony abnormality. Electronically Signed   By: Rolm Baptise M.D.   On: 12/02/2020 01:46   CT PELVIS WO CONTRAST  Result Date: 12/02/2020 CLINICAL DATA:  Left hip pain after a fall on 11/19/2020. EXAM: CT  PELVIS WITHOUT CONTRAST TECHNIQUE: Multidetector CT imaging of the pelvis was performed following the standard protocol without intravenous contrast. COMPARISON:  CT abdomen and pelvis 10/11/2018 FINDINGS: Urinary Tract:  No bladder wall thickening or filling defect. Bowel: Visualized portions of large and small bowel are not abnormally distended. Scattered stool in the colon. Sigmoid colonic diverticula without evidence of diverticulitis. Appendix is not identified. Vascular/Lymphatic: Vascular calcifications in the iliac arteries. No aneurysm. Reproductive: Surgical absence of the uterus. No abnormal adnexal masses. Other: No free fluid in the pelvis. No significant pelvic lymphadenopathy. Musculoskeletal: Degenerative changes in the lower lumbar spine. 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 No evidence of acute fracture or dislocation of the pelvis or hips. Sacrum appears intact. Degenerative changes in the hips with subcortical cysts on the left hip. IMPRESSION: 1. No evidence of acute fracture or dislocation of the pelvis or hips. 2. 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 3. Degenerative changes in the hips with subcortical cysts on the left hip. Electronically Signed   By: Lucienne Capers M.D.   On: 12/02/2020 01:50   CT Knee Left Wo Contrast  Result Date: 12/02/2020 CLINICAL DATA:  Knee pain EXAM: CT OF THE LEFT KNEE WITHOUT CONTRAST TECHNIQUE: Multidetector CT imaging of the LEFT knee was performed according to the standard protocol. Multiplanar CT image reconstructions were also generated. COMPARISON:  None. FINDINGS: Bones/Joint/Cartilage There is a subtle acute nondisplaced fracture through the medial aspect of the head of the fibula (axial series 4, image 99). 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 There are intra-articular loose bodies. Ligaments Suboptimally assessed by CT. Muscles and  Tendons There is acute intramuscular abnormality. Soft tissues There is a small joint effusion. IMPRESSION: 1. Subtle acute nondisplaced fracture through the medial aspect of the head of the fibula. 2. 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 3. There is a small joint effusion. Electronically Signed   By: Constance Holster M.D.   On: 12/02/2020 03:34   MR LUMBAR SPINE WO CONTRAST  Result Date: 12/02/2020 CLINICAL DATA:  Low back and left hip pain.  Multiple falls. EXAM: MRI LUMBAR SPINE WITHOUT CONTRAST TECHNIQUE: Multiplanar, multisequence MR imaging of the lumbar spine was performed. No intravenous contrast was administered. COMPARISON:  01/10/2008 FINDINGS: Segmentation:  5 lumbar type vertebral bodies. Alignment:  2 mm degenerative anterolisthesis L4-5. Vertebrae:  No fracture or primary bone lesion. Conus medullaris and cauda equina: Conus extends to the L1-2 level. Conus and cauda equina appear normal. Paraspinal and other soft tissues: Negative Disc levels: No disc abnormality at L2-3 or above. At L2-3, patient has mild to moderate bilateral facet osteoarthritis. At L3-4 there is mild bulging of the disc in both posterolateral directions, more prominent on the left, with mild left foraminal encroachment  that could possibly affect the left L3 nerve. 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 These findings could relate to back pain or referred facet syndrome pain. L4-5: Bilateral facet arthropathy with 2 mm of anterolisthesis. Desiccation and bulging of the disc more prominent towards the right. Mild narrowing of both lateral recesses and of the intervertebral foramen on right, but without visible neural compression. Findings at this level could relate to back pain or referred facet syndrome. L5-S1: Previous left hemilaminectomy. Desiccation of the disc with loss of height. Endplate osteophytes and bulging of the disc slightly more prominent towards the left. Mild  facet osteoarthritis on the left. Moderate facet degeneration and hypertrophy on the right. Left foraminal stenosis and subarticular lateral recess stenosis could cause left-sided neural compression. IMPRESSION: 1. 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 2. L3-4: Bilateral posterolateral disc bulges, more prominent on the left. Mild left foraminal encroachment that could possibly affect the left L3 nerve. Moderate to severe bilateral facet osteoarthritis. These findings could relate to back pain or referred facet syndrome pain. 3. L4-5: Bilateral facet arthropathy with 2 mm of anterolisthesis. Bulging of the disc more prominent towards the right. Mild narrowing of the lateral recesses and of the intervertebral foramen on the right, but without visible neural compression. Findings at this level could relate to back pain or referred facet syndrome pain. 4. L5-S1: Previous left hemilaminectomy. Endplate osteophytes and bulging of the disc more prominent towards the left. Facet degeneration and hypertrophy left more than right with left foraminal stenosis and subarticular lateral recess stenosis could cause left-sided neural compression. Electronically Signed   By: Nelson Chimes M.D.   On: 12/02/2020 13:26   MR HIP LEFT WO CONTRAST  Result Date: 12/02/2020 CLINICAL DATA:  Low back pain and left hip pain.  Multiple falls EXAM: MR OF THE LEFT HIP WITHOUT CONTRAST TECHNIQUE: Multiplanar, multisequence MR imaging was performed. No intravenous contrast was administered. COMPARISON:  CT pelvis 12/02/2020 FINDINGS: Despite efforts by the technologist and patient, motion artifact is present on today's exam and could not be eliminated. This reduces exam sensitivity and specificity. The standard small field-of-view coronal proton density fat saturated sequence could not be acquired (series 18 is a nondiagnostic sagittal series although it is labeled "COR PD FS") due to patient discomfort. Bones: There is evidence  of mild chronic bilateral hip avascular necrosis without significant marrow edema or findings of collapse. Degenerative subcortical cystic lesions are present along the left femoral head and adjacent acetabulum. Mild associated spurring. No hip fracture or generalized marrow edema signal is identified. Articular cartilage and labrum Articular cartilage: Moderate degenerative chondral thinning in both hips. Labrum: No paralabral cyst identified. Today's exam is not considered accurate in assessing for small labral tears due to motion artifact and other factors. Joint or bursal effusion Joint effusion:  Absent Bursae: No regional bursitis Muscles and tendons Muscles and tendons: Mild proximal hamstring tendinopathy, left greater than right. Other findings Miscellaneous: 1.3 cm left eccentric Bartholin's cyst versus Gartner cyst. IMPRESSION: 1. No acute findings involving the left hip. 2. Mild chronic bilateral hip avascular necrosis. Moderate degenerative hip findings bilaterally. No hip effusion or regional bursitis. 3. Mild left greater than right hamstring tendinopathy. 4. 1.3 cm left eccentric Bartholin's cyst or Gartner cyst. Electronically Signed   By: Van Clines M.D.   On: 12/02/2020 15:28   DG Knee Complete 4 Views Left  Result Date: 12/02/2020 CLINICAL DATA:  Pain status post fall. EXAM: LEFT TIBIA AND FIBULA -  2 VIEW; LEFT FEMUR 2 VIEWS; LEFT KNEE - COMPLETE 4+ VIEW; LEFT ANKLE COMPLETE - 3+ VIEW COMPARISON:  None. FINDINGS: There is mild soft tissue swelling about the patient's ankle without evidence for an acute displaced fracture or dislocation. There are degenerative changes of the ankle mortise. There is a moderate-sized plantar calcaneal spur. Vascular calcifications are noted. There are degenerative changes of the knee without evidence for significant joint effusion or acute displaced fracture. There is no acute osseous abnormality involving the patient's left femur. There is no hip  dislocation. Mild degenerative changes are noted of the patient's left hip. Vascular calcifications are noted. IMPRESSION: 1. No acute displaced fracture or dislocation involving the patient's left lower extremity. 2. Mild soft tissue swelling about the ankle. 3. Degenerative changes of the knee and hip. Electronically Signed   By: Constance Holster M.D.   On: 12/02/2020 01:19   DG Femur Min 2 Views Left  Result Date: 12/02/2020 CLINICAL DATA:  Pain status post fall. EXAM: LEFT TIBIA AND FIBULA - 2 VIEW; LEFT FEMUR 2 VIEWS; LEFT KNEE - COMPLETE 4+ VIEW; LEFT ANKLE COMPLETE - 3+ VIEW COMPARISON:  None. FINDINGS: There is mild soft tissue swelling about the patient's ankle without evidence for an acute displaced fracture or dislocation. There are degenerative changes of the ankle mortise. There is a moderate-sized plantar calcaneal spur. Vascular calcifications are noted. There are degenerative changes of the knee without evidence for significant joint effusion or acute displaced fracture. There is no acute osseous abnormality involving the patient's left femur. There is no hip dislocation. Mild degenerative changes are noted of the patient's left hip. Vascular calcifications are noted. IMPRESSION: 1. No acute displaced fracture or dislocation involving the patient's left lower extremity. 2. Mild soft tissue swelling about the ankle. 3. Degenerative changes of the knee and hip. Electronically Signed   By: Constance Holster M.D.   On: 12/02/2020 01:19    Review of Systems  Constitutional: Positive for activity change. Negative for appetite change and chills.  Respiratory: Negative for shortness of breath.   Cardiovascular: Negative for chest pain.  Neurological: Positive for weakness.       Altered sensation lower extremity  All other systems reviewed and are negative.  Blood pressure (!) 146/66, pulse 71, temperature 98.5 F (36.9 C), temperature source Oral, resp. rate 19, height '5\' 2"'$  (1.575 m),  weight 74.6 kg, SpO2 99 %. Physical Exam Vitals and nursing note reviewed.  Constitutional:      General: She is in acute distress.     Appearance: She is normal weight. She is not ill-appearing, toxic-appearing or diaphoretic.  HENT:     Head: Normocephalic and atraumatic.  Eyes:     General: No scleral icterus.    Extraocular Movements: Extraocular movements intact.     Conjunctiva/sclera: Conjunctivae normal.     Pupils: Pupils are equal, round, and reactive to light.  Cardiovascular:     Rate and Rhythm: Normal rate.  Musculoskeletal:     Comments: I could only do a limited exam  She can move her lower extremities normally.  However she is extremely tender to touch over her left knee she will bend the knee and move the foot on the left side but has extreme pain when you do so  That is all the exam that I could perform  Skin:    General: Skin is warm and dry.     Capillary Refill: Capillary refill takes less than 2 seconds.  Neurological:  Mental Status: She is alert and oriented to person, place, and time.     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 These findings could relate to  back pain or referred facet syndrome pain.  L4-5: Bilateral facet arthropathy with 2 mm of anterolisthesis. Desiccation and bulging of the disc more prominent towards the right. Mild narrowing of both lateral recesses and of the intervertebral foramen on right, but without visible neural compression. Findings at this level could relate to back pain or referred facet syndrome.  L5-S1: Previous left hemilaminectomy. Desiccation of the disc with loss of height. Endplate osteophytes and bulging of the disc slightly more prominent towards the left. Mild facet osteoarthritis on the left. Moderate facet degeneration and hypertrophy on the right. Left foraminal stenosis and subarticular lateral recess stenosis could cause left-sided neural compression.  Patient appears to have acute radicular syndrome in the left lower extremity  I would increase her Neurontin and titrated up to either therapeutic effect or dizziness/somnolence at which case I would back off the dose at that point  I would put her on a sliding scale and put her on IV steroids  I would try to decrease the fentanyl and oxycodone and add a muscle relaxer as well.  Arther Abbott 12/03/2020, 12:30 PM

## 2020-12-03 NOTE — NC FL2 (Signed)
[place] MEDICAID FL2 LEVEL OF CARE SCREENING TOOL     IDENTIFICATION  Patient Name: [name] Birthdate: 26-Jan-1936 Sex: female Admission Date (Current Location): 12/01/2020  [facility] and [place] Number:  [employer] and Address:  [facility] [address] [place]      Provider Number: [id number]  Attending Physician Name and Address:  Murlean Iba, MD  Relative Name and Phone Number:       Current Level of Care: Hospital Recommended Level of Care: [place] Prior Approval Number:    Date Approved/Denied:   PASRR Number: [id number]  Discharge Plan: SNF    Current Diagnoses: Patient Active Problem List   Diagnosis Date Noted  . Leg pain 12/02/2020  . Left hip pain 12/02/2020  . Fibula fracture 12/02/2020  . Inability to bear weight 12/02/2020  . Diabetes mellitus without complication ([place])   . High cholesterol   . Neuropathy   . Essential hypertension   . Hyperglycemia   . AKI (acute kidney injury) ([place])   . Normocytic anemia     Orientation RESPIRATION BLADDER Height & Weight     Self,Time,Situation,Place  Normal External catheter Weight: 164 lb 7.4 oz (74.6 kg) Height:  '5\' 2"'$  (157.5 cm)  BEHAVIORAL SYMPTOMS/MOOD NEUROLOGICAL BOWEL NUTRITION STATUS      Continent Diet (Carb modified. See d/c summary for updates.)  AMBULATORY STATUS COMMUNICATION OF NEEDS Skin   Extensive Assist Verbally Normal                       Personal Care Assistance Level of Assistance  Bathing,Feeding,Dressing Bathing Assistance: Maximum assistance Feeding assistance: Limited assistance Dressing Assistance: Maximum assistance     Functional Limitations Info  Sight,Hearing,Speech Sight Info: Impaired Hearing Info: Adequate Speech Info: Adequate    SPECIAL CARE FACTORS FREQUENCY  PT (By licensed PT)     PT Frequency: 5x weekly              Contractures      Additional Factors Info  Code  Status,Allergies Code Status Info: Full code Allergies Info: Codeine           Current Medications (12/03/2020):  This is the current hospital active medication list Current Facility-Administered Medications  Medication Dose Route Frequency Provider Last Rate Last Admin  . acetaminophen (TYLENOL) tablet 650 mg  650 mg Oral Q6H Johnson, Clanford L, MD   650 mg at 12/03/20 [id number]   Or  . acetaminophen (TYLENOL) suppository 650 mg  650 mg Rectal Q6H Johnson, Clanford L, MD      . albuterol (PROVENTIL) (2.5 MG/3ML) 0.083% nebulizer solution 3 mL  3 mL Nebulization Q4H PRN Johnson, Clanford L, MD      . atorvastatin (LIPITOR) tablet 20 mg  20 mg Oral QPM Johnson, Clanford L, MD   20 mg at 12/02/20 1726  . bisacodyl (DULCOLAX) suppository 10 mg  10 mg Rectal Daily PRN Johnson, Clanford L, MD      . docusate sodium (COLACE) capsule 100 mg  100 mg Oral BID Wynetta Emery, Clanford L, MD   100 mg at 12/03/20 0837  . enoxaparin (LOVENOX) injection 30 mg  30 mg Subcutaneous Q24H Johnson, Clanford L, MD      . fentaNYL (SUBLIMAZE) injection 25 mcg  25 mcg Intravenous Q2H PRN Wynetta Emery, Clanford L, MD   25 mcg at 12/03/20 0554  . gabapentin (NEURONTIN) capsule 100 mg  100 mg Oral TID Johnson, Clanford L,  MD   100 mg at 12/03/20 0837  . insulin aspart (novoLOG) injection 0-15 Units  0-15 Units Subcutaneous TID WC Wynetta Emery, Clanford L, MD   2 Units at 12/03/20 [phone]  . insulin aspart (novoLOG) injection 6 Units  6 Units Subcutaneous TID WC Wynetta Emery, Clanford L, MD   6 Units at 12/03/20 0843  . insulin glargine (LANTUS) injection 20 Units  20 Units Subcutaneous Daily Johnson, Clanford L, MD      . lisinopril (ZESTRIL) tablet 10 mg  10 mg Oral Daily Johnson, Clanford L, MD   10 mg at 12/03/20 [id number]  . multivitamin with minerals tablet 1 tablet  1 tablet Oral Daily Wynetta Emery, Clanford L, MD   1 tablet at 12/03/20 [phone]  . ondansetron (ZOFRAN) tablet 4 mg  4 mg Oral Q6H PRN Johnson, Clanford L, MD       Or  . ondansetron  (ZOFRAN) injection 4 mg  4 mg Intravenous Q6H PRN Wynetta Emery, Clanford L, MD   4 mg at 12/02/20 2048  . oxyCODONE (Oxy IR/ROXICODONE) immediate release tablet 5 mg  5 mg Oral Q4H PRN Wynetta Emery, Clanford L, MD   5 mg at 12/03/20 [id number]  . pantoprazole (PROTONIX) EC tablet 40 mg  40 mg Oral Q0600 Wynetta Emery, Clanford L, MD   40 mg at 12/03/20 0547     Discharge Medications: Please see discharge summary for a list of discharge medications.  Relevant Imaging Results:  Relevant Lab Results:   Additional Information SSN: [id number]. Daughter reports pt has had first two COVID vaccines and believes she has had booster as well.  Salome Arnt, LCSW

## 2020-12-03 NOTE — Progress Notes (Signed)
Physical Therapy Treatment Patient Details Name: [name] MRN: [id number] DOB: 01-13-1936 Today's Date: 12/03/2020    History of Present Illness EMERLYNN Fernandez is a 85 y.o. female.  Patient presents via EMS with left hip and leg pain after a fall this morning.  States she lost her balance will try to take her sock off and fell to the ground on her left side.  Did hit her head but did not lose consciousness.  Complains of pain to her left hip and left knee and left ankle.  She was seen in urgent care and told she had a questionable fracture in her ankle and placed in a fracture boot.  They did not x-ray her hip by her report.  He denies any preceding dizziness or lightheadedness.  No chest pain or shortness of breath.  Complains of pain in her left hip that radiates down her left leg involving her knee and ankle as well.  Denies any neck or back pain.  No blood thinner use.  No focal weakness, numbness or tingling    PT Comments    Patient presents with c/o sever pain left knee even after receiving pain medication, tolerated gentle heel slides to left knee up to 20% of ROM, demonstrates slight improvement for sitting up at bedside, poor tolerance for weightbearing on LLE due to increased pain during transfer to chair and tolerated sitting up in chair with legs elevated with family member present in room after therapy - nursing staff aware.  Patient will benefit from continued physical therapy in hospital and recommended venue below to increase strength, balance, endurance for safe ADLs and gait.    Follow Up Recommendations  SNF     Equipment Recommendations  Rolling walker with 5" wheels    Recommendations for Other Services       Precautions / Restrictions Precautions Precautions: Fall Required Braces or Orthoses: Knee Immobilizer - Right Knee Immobilizer - Right: On when out of bed or walking Restrictions Weight Bearing Restrictions: No    Mobility  Bed Mobility Overal bed  mobility: Needs Assistance Bed Mobility: Supine to Sit     Supine to sit: Min assist;Mod assist     General bed mobility comments: slow labored movement requiring Min/mod assist to move LLE    Transfers Overall transfer level: Needs assistance Equipment used: Rolling walker (2 wheeled) Transfers: Sit to/from [facility] Sit to Stand: Mod assist         General transfer comment: slow labored movement, poor tolerance for weightbearing on LLE due to increased pain  Ambulation/Gait Ambulation/Gait assistance: Mod assist;Max assist Gait Distance (Feet): 4 Feet Assistive device: Rolling walker (2 wheeled) Gait Pattern/deviations: Decreased step length - right;Decreased step length - left;Decreased stance time - left;Decreased stride length;Antalgic Gait velocity: decreased   General Gait Details: slow labored movement with poor tolerance for weighbearing on LLE due to increased pain, had to sit before making it over to chair   Stairs             Wheelchair Mobility    Modified Rankin (Stroke Patients Only)       Balance Overall balance assessment: Needs assistance Sitting-balance support: Feet supported;No upper extremity supported Sitting balance-Leahy Scale: Fair Sitting balance - Comments: fair/good seated at EOB   Standing balance support: During functional activity;Bilateral upper extremity supported Standing balance-Leahy Scale: Poor Standing balance comment: fair/poor using RW  Cognition Arousal/Alertness: Awake/alert Behavior During Therapy: WFL for tasks assessed/performed Overall Cognitive Status: Within Functional Limits for tasks assessed                                        Exercises General Exercises - Lower Extremity Heel Slides: Supine;10 reps;Strengthening;Left;AROM;AAROM    General Comments        Pertinent Vitals/Pain Pain Assessment: Faces Pain Score: 10-Worst  pain ever Pain Location: left knee with referral to calf up to hip Pain Descriptors / Indicators: Sore;Grimacing;Guarding;Crying Pain Intervention(s): Limited activity within patient's tolerance;Monitored during session;Premedicated before session;Repositioned    Home Living                      Prior Function            PT Goals (current goals can now be found in the care plan section) Acute Rehab PT Goals Patient Stated Goal: return home after rehab PT Goal Formulation: With patient Time For Goal Achievement: 12/16/20 Potential to Achieve Goals: Good Progress towards PT goals: Progressing toward goals    Frequency    Min 3X/week      PT Plan      Co-evaluation              AM-PAC PT "6 Clicks" Mobility   Outcome Measure  Help needed turning from your back to your side while in a flat bed without using bedrails?: A Lot Help needed moving from lying on your back to sitting on the side of a flat bed without using bedrails?: A Lot Help needed moving to and from a bed to a chair (including a wheelchair)?: A Lot Help needed standing up from a chair using your arms (e.g., wheelchair or bedside chair)?: A Lot Help needed to walk in hospital room?: A Lot Help needed climbing 3-5 steps with a railing? : Total 6 Click Score: 11    End of Session   Activity Tolerance: Patient tolerated treatment well;Patient limited by fatigue;Patient limited by pain Patient left: in chair;with call bell/phone within reach;with family/visitor present Nurse Communication: Mobility status PT Visit Diagnosis: Unsteadiness on feet (R26.81);Muscle weakness (generalized) (M62.81);Other abnormalities of gait and mobility (R26.89)     Time: [id number] PT Time Calculation (min) (ACUTE ONLY): 31 min  Charges:  $Therapeutic Activity: 23-37 mins                     12:29 PM, 12/03/20 Lonell Grandchild, MPT Physical Therapist with [facility] [phone]  office [phone] mobile phone

## 2020-12-03 NOTE — Care Management Obs Status (Signed)
[place] NOTIFICATION   Patient Details  Name: [name] MRN: [id number] Date of Birth: [birth year]   Medicare Observation Status Notification Given:  Yes    Kenzo Ozment Chip Boer 12/03/2020, 1:50 PM

## 2020-12-03 NOTE — Progress Notes (Signed)
PROGRESS NOTE   [name]  [id number] DOB: May 13, 1936 DOA: 12/01/2020 PCP: Curlene Labrum, MD   Chief Complaint  Patient presents with  . Fall   Level of care: Med-Surg  Brief Admission History:  85 y.o. female with medical history significant for type 2 diabetes mellitus, hyperlipidemia, normal diabetic neuropathy, chronic neck pain presenting to the emergency department after a fall at home.  She reported that she ended up losing balance when trying to make his son close.  She said that she fell on her left side.  She hit her head.  There was no loss of consciousness.  Since that time she has had severe left hip left knee and left ankle pain.  She was seen initially at an urgent care facility.  She was told that she may have had an ankle fracture and was placed in a fracture boot.  She reports that she had no chest pain or shortness of breath dizziness that led to her potential to fall.  She has no visual changes and no focal weakness or numbness.  Her main complaint is severe pain inability to ambulate because of pain involving the left leg, knee and hip.  Assessment & Plan:   Active Problems:   Leg pain   Left hip pain   Fibula fracture   Inability to bear weight   Diabetes mellitus without complication (HCC)   High cholesterol   Neuropathy   Essential hypertension   Hyperglycemia   AKI (acute kidney injury) ([place])   Normocytic anemia   Uncontrolled pain  Acute left fibular head fracture - nondisplaced, continue knee immobilizer for now and pain management as ordered. PT evaluation requested.  Requesting orthopedics consultation.   Inability to bear weight - likely multifactorial in setting of bilateral chronic avascular necrosis and acute fibular head fracture.  PT eval and will order wheelchair.   Acute radicular syndrome - Dr. Aline Brochure saw her and diagnosed. He recommended IV steroids, Increase neurontin and reduce dose of oxycodone and fentanyl.   Left hip  pain - CT hip with no acute findings. Follow up MRI of hip and lumbar spine for further investigation into the source of the pain.  Continue pain management as ordered.    Uncontrolled type 2 diabetes mellitus with neuropathy - as evidenced by A1c of 8.6%.  Continue carbohydrate modified diet.  Continue basal insulin with supplemental sliding scale coverage and prandial coverage.  Monitor CBG times. Anticipating steroid induced hyperglycemia, increased SSI to max coverage, increased prandial novolog to 10 units.   Essential hypertension-resumed home blood pressure lowering medication closely unit protocol.  Hyperlipidemia-resumed home atorvastatin taking every evening.  Polyneuropathy of diabetes mellitus- resume gabapentin at higher does ordered by ortho   AKI - suspect prerenal with poor oral intake, trial of gentle IV fluid hydration, recheck BMP in AM. Bladder scan ordered.  Cath if unable to void.    DVT prophylaxis: enoxaparin  Code Status: Full   Family Communication: no family present during rounds, discussed plan of care with patient   Disposition Plan: anticipating SNF   Consults called: PT  Status is: Observation  The patient will require care spanning > 2 midnights and should be moved to inpatient because: Ongoing active pain requiring inpatient pain management  Dispo: The patient is from: Home              Anticipated d/c is to: SNF              Patient currently  is not medically stable to d/c.   Difficult to place patient No    Consultants:   Orthopedics Dr. Aline Brochure   Procedures:     Antimicrobials:     Subjective: Pt having excruciating pain in left leg and knee.   Objective: Vitals:   12/02/20 1756 12/02/20 2033 12/03/20 0545 12/03/20 0838  BP: (!) 94/50 96/84 (!) 123/52 (!) 146/66  Pulse: 81 86 71   Resp: 18  19   Temp: 98 F (36.7 C)  98.5 F (36.9 C)   TempSrc: Oral  Oral   SpO2: 99% 96% 99%   Weight:      Height:         Intake/Output Summary (Last 24 hours) at 12/03/2020 1426 Last data filed at 12/03/2020 1400 Gross per 24 hour  Intake 1180.28 ml  Output 701 ml  Net 479.28 ml   Filed Weights   12/01/20 2318 12/02/20 1000  Weight: 74.8 kg 74.6 kg    Examination:  General exam: pt appears to be in severe pain.  Appears calm and comfortable  Respiratory system: Clear to auscultation. Respiratory effort normal. Cardiovascular system: normal S1 & S2 heard. No JVD, murmurs, rubs, gallops or clicks. No pedal edema. Gastrointestinal system: Abdomen is nondistended, soft and nontender. No organomegaly or masses felt. Normal bowel sounds heard. Central nervous system: Alert and oriented. No focal neurological deficits. Extremities: exam limited by pain.  Skin: No rashes, lesions or ulcers Psychiatry: Judgement and insight appear normal. Mood & affect appropriate.   Data Reviewed: I have personally reviewed following labs and imaging studies  CBC: Recent Labs  Lab 12/02/20 0454 12/03/20 0407  WBC 6.4 6.5  NEUTROABS 3.2  --   HGB 11.3* 9.8*  HCT 34.4* 30.5*  MCV 93.0 93.8  PLT 216 [id number]    Basic Metabolic Panel: Recent Labs  Lab 12/02/20 0454 12/03/20 0407  NA 137 134*  K 4.3 4.4  CL 104 103  CO2 23 21*  GLUCOSE 209* 188*  BUN 33* 41*  CREATININE 1.13* 1.49*  CALCIUM 9.0 8.2*    GFR: Estimated Creatinine Clearance: 27.5 mL/min (A) (by C-G formula based on SCr of 1.49 mg/dL (H)).  Liver Function Tests: No results for input(s): AST, ALT, ALKPHOS, BILITOT, PROT, ALBUMIN in the last 168 hours.  CBG: Recent Labs  Lab 12/02/20 1611 12/02/20 1959 12/03/20 0312 12/03/20 0751 12/03/20 1156  GLUCAP 109* 89 178* 128* 233*    Recent Results (from the past 240 hour(s))  Resp Panel by RT-PCR (Flu A&B, Covid) Nasopharyngeal Swab     Status: None   Collection Time: 12/02/20  5:00 AM   Specimen: Nasopharyngeal Swab; Nasopharyngeal(NP) swabs in vial transport medium  Result Value Ref  Range Status   SARS Coronavirus 2 by RT PCR NEGATIVE NEGATIVE Final    Comment: (NOTE) SARS-CoV-2 target nucleic acids are NOT DETECTED.  The SARS-CoV-2 RNA is generally detectable in upper respiratory specimens during the acute phase of infection. The lowest concentration of SARS-CoV-2 viral copies this assay can detect is 138 copies/mL. A negative result does not preclude SARS-Cov-2 infection and should not be used as the sole basis for treatment or other patient management decisions. A negative result may occur with  improper specimen collection/handling, submission of specimen other than nasopharyngeal swab, presence of viral mutation(s) within the areas targeted by this assay, and inadequate number of viral copies(<138 copies/mL). A negative result must be combined with clinical observations, patient history, and epidemiological information. The expected result is Negative.  Fact Sheet for Patients:  EntrepreneurPulse.com.au  Fact Sheet for Healthcare Providers:  IncredibleEmployment.be  This test is no t yet approved or cleared by the [place] FDA and  has been authorized for detection and/or diagnosis of SARS-CoV-2 by FDA under an Emergency Use Authorization (EUA). This EUA will remain  in effect (meaning this test can be used) for the duration of the COVID-19 declaration under Section 564(b)(1) of the Act, 21 U.S.C.section 360bbb-3(b)(1), unless the authorization is terminated  or revoked sooner.       Influenza A by PCR NEGATIVE NEGATIVE Final   Influenza B by PCR NEGATIVE NEGATIVE Final    Comment: (NOTE) The Xpert Xpress SARS-CoV-2/FLU/RSV plus assay is intended as an aid in the diagnosis of influenza from Nasopharyngeal swab specimens and should not be used as a sole basis for treatment. Nasal washings and aspirates are unacceptable for Xpert Xpress SARS-CoV-2/FLU/RSV testing.  Fact Sheet for  Patients: EntrepreneurPulse.com.au  Fact Sheet for Healthcare Providers: IncredibleEmployment.be  This test is not yet approved or cleared by the [place] FDA and has been authorized for detection and/or diagnosis of SARS-CoV-2 by FDA under an Emergency Use Authorization (EUA). This EUA will remain in effect (meaning this test can be used) for the duration of the COVID-19 declaration under Section 564(b)(1) of the Act, 21 U.S.C. section 360bbb-3(b)(1), unless the authorization is terminated or revoked.  Performed at [facility], [address], [place], [place] [address]      Radiology Studies: DG Tibia/Fibula Left  Result Date: 12/02/2020 CLINICAL DATA:  Pain status post fall. EXAM: LEFT TIBIA AND FIBULA - 2 VIEW; LEFT FEMUR 2 VIEWS; LEFT KNEE - COMPLETE 4+ VIEW; LEFT ANKLE COMPLETE - 3+ VIEW COMPARISON:  None. FINDINGS: There is mild soft tissue swelling about the patient's ankle without evidence for an acute displaced fracture or dislocation. There are degenerative changes of the ankle mortise. There is a moderate-sized plantar calcaneal spur. Vascular calcifications are noted. There are degenerative changes of the knee without evidence for significant joint effusion or acute displaced fracture. There is no acute osseous abnormality involving the patient's left femur. There is no hip dislocation. Mild degenerative changes are noted of the patient's left hip. Vascular calcifications are noted. IMPRESSION: 1. No acute displaced fracture or dislocation involving the patient's left lower extremity. 2. Mild soft tissue swelling about the ankle. 3. Degenerative changes of the knee and hip. Electronically Signed   By: Constance Holster M.D.   On: 12/02/2020 01:19   DG Ankle Complete Left  Result Date: 12/02/2020 CLINICAL DATA:  Pain status post fall. EXAM: LEFT TIBIA AND FIBULA - 2 VIEW; LEFT FEMUR 2 VIEWS; LEFT KNEE - COMPLETE 4+ VIEW; LEFT ANKLE COMPLETE  - 3+ VIEW COMPARISON:  None. FINDINGS: There is mild soft tissue swelling about the patient's ankle without evidence for an acute displaced fracture or dislocation. There are degenerative changes of the ankle mortise. There is a moderate-sized plantar calcaneal spur. Vascular calcifications are noted. There are degenerative changes of the knee without evidence for significant joint effusion or acute displaced fracture. There is no acute osseous abnormality involving the patient's left femur. There is no hip dislocation. Mild degenerative changes are noted of the patient's left hip. Vascular calcifications are noted. IMPRESSION: 1. No acute displaced fracture or dislocation involving the patient's left lower extremity. 2. Mild soft tissue swelling about the ankle. 3. Degenerative changes of the knee and hip. Electronically Signed   By: Constance Holster M.D.   On:  12/02/2020 01:19   CT Head Wo Contrast  Result Date: 12/02/2020 CLINICAL DATA:  Fall EXAM: CT HEAD WITHOUT CONTRAST TECHNIQUE: Contiguous axial images were obtained from the base of the skull through the vertex without intravenous contrast. COMPARISON:  08/20/2019 FINDINGS: Brain: No acute intracranial abnormality. Specifically, no hemorrhage, hydrocephalus, mass lesion, acute infarction, or significant intracranial injury. Vascular: No hyperdense vessel or unexpected calcification. Skull: No acute calvarial abnormality. Sinuses/Orbits: Polypoid filling defects in both maxillary sinuses. No air-fluid levels. Other: None IMPRESSION: No acute intracranial abnormality. Polypoid sinus disease. Electronically Signed   By: Rolm Baptise M.D.   On: 12/02/2020 01:45   CT Cervical Spine Wo Contrast  Result Date: 12/02/2020 CLINICAL DATA:  Fall EXAM: CT CERVICAL SPINE WITHOUT CONTRAST TECHNIQUE: Multidetector CT imaging of the cervical spine was performed without intravenous contrast. Multiplanar CT image reconstructions were also generated. COMPARISON:   08/20/2019 FINDINGS: Alignment: No subluxation. Skull base and vertebrae: No acute fracture. No primary bone lesion or focal pathologic process. Soft tissues and spinal canal: No prevertebral fluid or swelling. No visible canal hematoma. Disc levels: Prior anterior fusion from C4-C6. Advanced degenerative disc disease at C3-4 and C6-7. Mild bilateral diffuse degenerative facet disease. Upper chest: Biapical scarring. Other: None IMPRESSION: Postoperative and degenerative changes in the cervical spine. No acute bony abnormality. Electronically Signed   By: Rolm Baptise M.D.   On: 12/02/2020 01:46   CT PELVIS WO CONTRAST  Result Date: 12/02/2020 CLINICAL DATA:  Left hip pain after a fall on 11/19/2020. EXAM: CT PELVIS WITHOUT CONTRAST TECHNIQUE: Multidetector CT imaging of the pelvis was performed following the standard protocol without intravenous contrast. COMPARISON:  CT abdomen and pelvis 10/11/2018 FINDINGS: Urinary Tract:  No bladder wall thickening or filling defect. Bowel: Visualized portions of large and small bowel are not abnormally distended. Scattered stool in the colon. Sigmoid colonic diverticula without evidence of diverticulitis. Appendix is not identified. Vascular/Lymphatic: Vascular calcifications in the iliac arteries. No aneurysm. Reproductive: Surgical absence of the uterus. No abnormal adnexal masses. Other: No free fluid in the pelvis. No significant pelvic lymphadenopathy. Musculoskeletal: Degenerative changes in the lower lumbar spine. 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 No evidence of acute fracture or dislocation of the pelvis or hips. Sacrum appears intact. Degenerative changes in the hips with subcortical cysts on the left hip. IMPRESSION: 1. No evidence of acute fracture or dislocation of the pelvis or hips. 2. 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 3. Degenerative changes in the  hips with subcortical cysts on the left hip. Electronically Signed   By: Lucienne Capers M.D.   On: 12/02/2020 01:50   CT Knee Left Wo Contrast  Result Date: 12/02/2020 CLINICAL DATA:  Knee pain EXAM: CT OF THE LEFT KNEE WITHOUT CONTRAST TECHNIQUE: Multidetector CT imaging of the LEFT knee was performed according to the standard protocol. Multiplanar CT image reconstructions were also generated. COMPARISON:  None. FINDINGS: Bones/Joint/Cartilage There is a subtle acute nondisplaced fracture through the medial aspect of the head of the fibula (axial series 4, image 99). 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 There are intra-articular loose bodies. Ligaments Suboptimally assessed by CT. Muscles and Tendons There is acute intramuscular abnormality. Soft tissues There is a small joint effusion. IMPRESSION: 1. Subtle acute nondisplaced fracture through the medial aspect of the head of the fibula. 2. 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 3. There is a small joint effusion. Electronically Signed   By: Jamie Kato.D.  On: 12/02/2020 03:34   MR LUMBAR SPINE WO CONTRAST  Result Date: 12/02/2020 CLINICAL DATA:  Low back and left hip pain.  Multiple falls. EXAM: MRI LUMBAR SPINE WITHOUT CONTRAST TECHNIQUE: Multiplanar, multisequence MR imaging of the lumbar spine was performed. No intravenous contrast was administered. COMPARISON:  01/10/2008 FINDINGS: Segmentation:  5 lumbar type vertebral bodies. Alignment:  2 mm degenerative anterolisthesis L4-5. Vertebrae:  No fracture or primary bone lesion. Conus medullaris and cauda equina: Conus extends to the L1-2 level. Conus and cauda equina appear normal. Paraspinal and other soft tissues: Negative Disc levels: No disc abnormality at L2-3 or above. At L2-3, patient has mild to moderate bilateral facet osteoarthritis. At L3-4 there is mild bulging of the disc in both posterolateral directions, more prominent on the left, with  mild left foraminal encroachment that could possibly affect the left L3 nerve. 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 These findings could relate to back pain or referred facet syndrome pain. L4-5: Bilateral facet arthropathy with 2 mm of anterolisthesis. Desiccation and bulging of the disc more prominent towards the right. Mild narrowing of both lateral recesses and of the intervertebral foramen on right, but without visible neural compression. Findings at this level could relate to back pain or referred facet syndrome. L5-S1: Previous left hemilaminectomy. Desiccation of the disc with loss of height. Endplate osteophytes and bulging of the disc slightly more prominent towards the left. Mild facet osteoarthritis on the left. Moderate facet degeneration and hypertrophy on the right. Left foraminal stenosis and subarticular lateral recess stenosis could cause left-sided neural compression. IMPRESSION: 1. 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 2. L3-4: Bilateral posterolateral disc bulges, more prominent on the left. Mild left foraminal encroachment that could possibly affect the left L3 nerve. Moderate to severe bilateral facet osteoarthritis. These findings could relate to back pain or referred facet syndrome pain. 3. L4-5: Bilateral facet arthropathy with 2 mm of anterolisthesis. Bulging of the disc more prominent towards the right. Mild narrowing of the lateral recesses and of the intervertebral foramen on the right, but without visible neural compression. Findings at this level could relate to back pain or referred facet syndrome pain. 4. L5-S1: Previous left hemilaminectomy. Endplate osteophytes and bulging of the disc more prominent towards the left. Facet degeneration and hypertrophy left more than right with left foraminal stenosis and subarticular lateral recess stenosis could cause left-sided neural compression. Electronically Signed   By: Nelson Chimes M.D.   On:  12/02/2020 13:26   MR HIP LEFT WO CONTRAST  Result Date: 12/02/2020 CLINICAL DATA:  Low back pain and left hip pain.  Multiple falls EXAM: MR OF THE LEFT HIP WITHOUT CONTRAST TECHNIQUE: Multiplanar, multisequence MR imaging was performed. No intravenous contrast was administered. COMPARISON:  CT pelvis 12/02/2020 FINDINGS: Despite efforts by the technologist and patient, motion artifact is present on today's exam and could not be eliminated. This reduces exam sensitivity and specificity. The standard small field-of-view coronal proton density fat saturated sequence could not be acquired (series 18 is a nondiagnostic sagittal series although it is labeled "COR PD FS") due to patient discomfort. Bones: There is evidence of mild chronic bilateral hip avascular necrosis without significant marrow edema or findings of collapse. Degenerative subcortical cystic lesions are present along the left femoral head and adjacent acetabulum. Mild associated spurring. No hip fracture or generalized marrow edema signal is identified. Articular cartilage and labrum Articular cartilage: Moderate degenerative chondral thinning in both hips. Labrum: No paralabral cyst identified. Today's exam  is not considered accurate in assessing for small labral tears due to motion artifact and other factors. Joint or bursal effusion Joint effusion:  Absent Bursae: No regional bursitis Muscles and tendons Muscles and tendons: Mild proximal hamstring tendinopathy, left greater than right. Other findings Miscellaneous: 1.3 cm left eccentric Bartholin's cyst versus Gartner cyst. IMPRESSION: 1. No acute findings involving the left hip. 2. Mild chronic bilateral hip avascular necrosis. Moderate degenerative hip findings bilaterally. No hip effusion or regional bursitis. 3. Mild left greater than right hamstring tendinopathy. 4. 1.3 cm left eccentric Bartholin's cyst or Gartner cyst. Electronically Signed   By: Van Clines M.D.   On: 12/02/2020  15:28   DG Knee Complete 4 Views Left  Result Date: 12/02/2020 CLINICAL DATA:  Pain status post fall. EXAM: LEFT TIBIA AND FIBULA - 2 VIEW; LEFT FEMUR 2 VIEWS; LEFT KNEE - COMPLETE 4+ VIEW; LEFT ANKLE COMPLETE - 3+ VIEW COMPARISON:  None. FINDINGS: There is mild soft tissue swelling about the patient's ankle without evidence for an acute displaced fracture or dislocation. There are degenerative changes of the ankle mortise. There is a moderate-sized plantar calcaneal spur. Vascular calcifications are noted. There are degenerative changes of the knee without evidence for significant joint effusion or acute displaced fracture. There is no acute osseous abnormality involving the patient's left femur. There is no hip dislocation. Mild degenerative changes are noted of the patient's left hip. Vascular calcifications are noted. IMPRESSION: 1. No acute displaced fracture or dislocation involving the patient's left lower extremity. 2. Mild soft tissue swelling about the ankle. 3. Degenerative changes of the knee and hip. Electronically Signed   By: Constance Holster M.D.   On: 12/02/2020 01:19   DG Femur Min 2 Views Left  Result Date: 12/02/2020 CLINICAL DATA:  Pain status post fall. EXAM: LEFT TIBIA AND FIBULA - 2 VIEW; LEFT FEMUR 2 VIEWS; LEFT KNEE - COMPLETE 4+ VIEW; LEFT ANKLE COMPLETE - 3+ VIEW COMPARISON:  None. FINDINGS: There is mild soft tissue swelling about the patient's ankle without evidence for an acute displaced fracture or dislocation. There are degenerative changes of the ankle mortise. There is a moderate-sized plantar calcaneal spur. Vascular calcifications are noted. There are degenerative changes of the knee without evidence for significant joint effusion or acute displaced fracture. There is no acute osseous abnormality involving the patient's left femur. There is no hip dislocation. Mild degenerative changes are noted of the patient's left hip. Vascular calcifications are noted. IMPRESSION: 1.  No acute displaced fracture or dislocation involving the patient's left lower extremity. 2. Mild soft tissue swelling about the ankle. 3. Degenerative changes of the knee and hip. Electronically Signed   By: Constance Holster M.D.   On: 12/02/2020 01:19    Scheduled Meds: . acetaminophen  650 mg Oral Q6H   Or  . acetaminophen  650 mg Rectal Q6H  . atorvastatin  20 mg Oral QPM  . docusate sodium  100 mg Oral BID  . enoxaparin (LOVENOX) injection  30 mg Subcutaneous Q24H  . gabapentin  200 mg Oral TID  . insulin aspart  0-20 Units Subcutaneous TID WC  . insulin aspart  0-5 Units Subcutaneous QHS  . insulin aspart  10 Units Subcutaneous TID WC  . insulin glargine  20 Units Subcutaneous Daily  . lisinopril  10 mg Oral Daily  . methylPREDNISolone (SOLU-MEDROL) injection  40 mg Intravenous Q6H  . multivitamin with minerals  1 tablet Oral Daily  . pantoprazole  40 mg Oral Q0600  Continuous Infusions: . sodium chloride 65 mL/hr at 12/03/20 1258     LOS: 0 days   Time spent: 35 mins   [place] Wynetta Emery, MD How to contact the [facility] Attending or Consulting provider [place] or covering provider during after hours [place], for this patient?  1. Check the care team in [facility] and look for a) attending/consulting [facility] provider listed and b) the [facility] team listed 2. Log into [email or web address] and use [facility] universal password to access. If you do not have the password, please contact the hospital operator. 3. Locate the [facility] provider you are looking for under [facility] and page to a number that you can be directly reached. 4. If you still have difficulty reaching the provider, please page the [facility] (Director on Call) for the Hospitalists listed on amion for assistance.  12/03/2020, 2:26 PM

## 2020-12-03 NOTE — TOC Initial Note (Signed)
Transition of Care [facility]) - Initial/Assessment Note    Patient Details  Name: [name] MRN: [id number] Date of Birth: June 18, 1936  Transition [facility] CM/SW Contact:    Salome Arnt, [place] Phone Number: 12/03/2020, 3:54 PM  Clinical Narrative:  Pt admitted due to acute left fibular head fracture. LCSW completed most of assessment with pt and pt's daughter, [name]. However, they requested that LCSW speak with pt's daughter, [name] who is a Marine scientist. LCSW left 2 voicemails today for [facility] requesting return call. Pt states she lives alone and manages fairly well. PT evaluated pt and recommend SNF. LCSW discussed placement process and pt agreeable to short term SNF. Pt accepts bed offer at [facility]. [name] asked about transfer to [facility], but did not provide reason besides wanting a bigger hospital. MD notified. LCSW called [name] again, but did not answer. [name] at [facility] aware pt has accepted bed offer. Per [facility], pt is not managed by them. [name] will start authorization. TOC will continue to follow.                   Expected Discharge Plan: Skilled Nursing Facility Barriers to Discharge: Continued Medical Work up   Patient Goals and CMS Choice Patient states their goals for this hospitalization and ongoing recovery are:: short term SNF   Choice offered to / list presented to : Patient  Expected Discharge Plan and Services Expected Discharge Plan: [place] In-house Referral: Clinical Social Work   Post Acute Care Choice: Nursing Home Living arrangements for the past 2 months: Single Family Home                 DME Arranged: N/A DME Agency: NA                  Prior Living Arrangements/Services Living arrangements for the past 2 months: Single Family Home Lives with:: Self Patient language and need for interpreter reviewed:: Yes Do you feel safe going back to the place where you live?: No   Needs SNF  Need for Family Participation in Patient Care:  Yes (Comment)   Current home services: DME (cane, walker, wheelchair, BSC) Criminal Activity/Legal Involvement Pertinent to Current Situation/Hospitalization: No - Comment as needed  Activities of Daily Living Home Assistive Devices/Equipment: None ADL Screening (condition at time of admission) Patient's cognitive ability adequate to safely complete daily activities?: Yes Is the patient deaf or have difficulty hearing?: No Does the patient have difficulty seeing, even when wearing glasses/contacts?: No Does the patient have difficulty concentrating, remembering, or making decisions?: No Patient able to express need for assistance with ADLs?: Yes Does the patient have difficulty dressing or bathing?: No Independently performs ADLs?: Yes (appropriate for developmental age) Does the patient have difficulty walking or climbing stairs?: Yes (Due to Fx) Weakness of Legs: Left Weakness of Arms/Hands: None  Permission Sought/Granted                  Emotional Assessment       Orientation: : Oriented to Self,Oriented to Place,Oriented to  Time,Oriented to Situation Alcohol / Substance Use: Not Applicable Psych Involvement: No (comment)  Admission diagnosis:  Leg pain [M79.606] Fall, initial encounter [W19.XXXA] Closed fracture of proximal end of left fibula, unspecified fracture morphology, initial encounter [id number] Uncontrolled pain [R52] Patient Active Problem List   Diagnosis Date Noted  . Uncontrolled pain 12/03/2020  . Leg pain 12/02/2020  . Left hip pain 12/02/2020  . Fibula fracture 12/02/2020  .  Inability to bear weight 12/02/2020  . Diabetes mellitus without complication ([place])   . High cholesterol   . Neuropathy   . Essential hypertension   . Hyperglycemia   . AKI (acute kidney injury) ([place])   . Normocytic anemia    PCP:  Curlene Labrum, MD Pharmacy:   [facility], [place] [address] Phone:  [phone] Fax: [phone]     Social Determinants of Health (SDOH) Interventions    Readmission Risk Interventions No flowsheet data found.

## 2020-12-03 NOTE — Progress Notes (Signed)
Called into room by Dr. Wynetta Emery for patient having uncontrollable pain. Advised Dr. Wynetta Emery Tylenol, Oxycodone, and Gabapentin was given at [phone]. Dr. Wynetta Emery advised this nurse to give PRN Fentanyl. Patient crying and grabbing at left leg, Fentanyl was given.

## 2020-12-04 ENCOUNTER — Inpatient Hospital Stay
Admission: RE | Admit: 2020-12-04 | Discharge: 2020-12-21 | Disposition: A | Discharge status: Home / Self Care | Payer: Medicare HMO | Source: Ambulatory Visit | Attending: Internal Medicine | Admitting: Internal Medicine

## 2020-12-04 DIAGNOSIS — N179 Acute kidney failure, unspecified: Secondary | ICD-10-CM

## 2020-12-04 DIAGNOSIS — E119 Type 2 diabetes mellitus without complications: Secondary | ICD-10-CM | POA: Diagnosis not present

## 2020-12-04 DIAGNOSIS — S82832D Other fracture of upper and lower end of left fibula, subsequent encounter for closed fracture with routine healing: Secondary | ICD-10-CM | POA: Diagnosis not present

## 2020-12-04 DIAGNOSIS — M6281 Muscle weakness (generalized): Secondary | ICD-10-CM | POA: Diagnosis not present

## 2020-12-04 DIAGNOSIS — I10 Essential (primary) hypertension: Secondary | ICD-10-CM | POA: Diagnosis not present

## 2020-12-04 DIAGNOSIS — K5909 Other constipation: Secondary | ICD-10-CM | POA: Diagnosis not present

## 2020-12-04 DIAGNOSIS — S82832S Other fracture of upper and lower end of left fibula, sequela: Secondary | ICD-10-CM | POA: Diagnosis not present

## 2020-12-04 DIAGNOSIS — G629 Polyneuropathy, unspecified: Secondary | ICD-10-CM | POA: Diagnosis not present

## 2020-12-04 DIAGNOSIS — K219 Gastro-esophageal reflux disease without esophagitis: Secondary | ICD-10-CM | POA: Diagnosis not present

## 2020-12-04 DIAGNOSIS — E1159 Type 2 diabetes mellitus with other circulatory complications: Secondary | ICD-10-CM | POA: Diagnosis not present

## 2020-12-04 DIAGNOSIS — R262 Difficulty in walking, not elsewhere classified: Secondary | ICD-10-CM | POA: Diagnosis not present

## 2020-12-04 DIAGNOSIS — Z794 Long term (current) use of insulin: Secondary | ICD-10-CM | POA: Diagnosis not present

## 2020-12-04 DIAGNOSIS — M25552 Pain in left hip: Secondary | ICD-10-CM

## 2020-12-04 DIAGNOSIS — N1832 Chronic kidney disease, stage 3b: Secondary | ICD-10-CM | POA: Diagnosis not present

## 2020-12-04 DIAGNOSIS — E1142 Type 2 diabetes mellitus with diabetic polyneuropathy: Secondary | ICD-10-CM | POA: Diagnosis not present

## 2020-12-04 DIAGNOSIS — E1169 Type 2 diabetes mellitus with other specified complication: Secondary | ICD-10-CM | POA: Diagnosis not present

## 2020-12-04 DIAGNOSIS — M87052 Idiopathic aseptic necrosis of left femur: Secondary | ICD-10-CM | POA: Diagnosis not present

## 2020-12-04 DIAGNOSIS — E785 Hyperlipidemia, unspecified: Secondary | ICD-10-CM | POA: Diagnosis not present

## 2020-12-04 DIAGNOSIS — Z9181 History of falling: Secondary | ICD-10-CM | POA: Diagnosis not present

## 2020-12-04 DIAGNOSIS — E1140 Type 2 diabetes mellitus with diabetic neuropathy, unspecified: Secondary | ICD-10-CM | POA: Diagnosis not present

## 2020-12-04 DIAGNOSIS — M87051 Idiopathic aseptic necrosis of right femur: Secondary | ICD-10-CM | POA: Diagnosis not present

## 2020-12-04 DIAGNOSIS — E1165 Type 2 diabetes mellitus with hyperglycemia: Secondary | ICD-10-CM | POA: Diagnosis not present

## 2020-12-04 DIAGNOSIS — I152 Hypertension secondary to endocrine disorders: Secondary | ICD-10-CM | POA: Diagnosis not present

## 2020-12-04 DIAGNOSIS — S82409A Unspecified fracture of shaft of unspecified fibula, initial encounter for closed fracture: Secondary | ICD-10-CM | POA: Diagnosis not present

## 2020-12-04 DIAGNOSIS — M5416 Radiculopathy, lumbar region: Secondary | ICD-10-CM | POA: Diagnosis not present

## 2020-12-04 DIAGNOSIS — D649 Anemia, unspecified: Secondary | ICD-10-CM | POA: Diagnosis not present

## 2020-12-04 LAB — GLUCOSE, CAPILLARY
Glucose-Capillary: 284 mg/dL — ABNORMAL HIGH (ref 70–99)
Glucose-Capillary: 279 mg/dL — ABNORMAL HIGH (ref 70–99)
Glucose-Capillary: 280 mg/dL — ABNORMAL HIGH (ref 70–99)

## 2020-12-04 LAB — BASIC METABOLIC PANEL
Anion gap: 10 (ref 5–15)
BUN: 29 mg/dL — ABNORMAL HIGH (ref 8–23)
CO2: 22 mmol/L (ref 22–32)
Calcium: 8.7 mg/dL — ABNORMAL LOW (ref 8.9–10.3)
Chloride: 106 mmol/L (ref 98–111)
Creatinine, Ser: 1 mg/dL (ref 0.44–1.00)
GFR, Estimated: 56 mL/min — ABNORMAL LOW (ref >60)
Glucose, Bld: 285 mg/dL — ABNORMAL HIGH (ref 70–99)
Potassium: 4.3 mmol/L (ref 3.5–5.1)
Sodium: 138 mmol/L (ref 135–145)

## 2020-12-04 MED ORDER — SENNOSIDES-DOCUSATE SODIUM 8.6-50 MG PO TABS: 2.0000 | ORAL_TABLET | Freq: Every day | ORAL | 1 refills | Status: DC

## 2020-12-04 MED ORDER — ATORVASTATIN CALCIUM 20 MG PO TABS
20.0000 mg | ORAL_TABLET | Freq: Every evening | ORAL | 3 refills | Status: DC
Start: 2020-12-04 — End: 2020-12-20

## 2020-12-04 MED ORDER — OXYCODONE HCL 5 MG PO TABS: 5.0000 mg | ORAL_TABLET | Freq: Four times a day (QID) | ORAL | 0 refills | Status: DC | PRN

## 2020-12-04 MED ORDER — ACETAMINOPHEN 325 MG PO TABS: 650.0000 mg | ORAL_TABLET | Freq: Four times a day (QID) | ORAL | Status: DC

## 2020-12-04 MED ORDER — PREDNISONE 20 MG PO TABS: 20.0000 mg | ORAL_TABLET | Freq: Every day | ORAL | 0 refills | Status: AC

## 2020-12-04 MED ORDER — GABAPENTIN 300 MG PO CAPS: 300.0000 mg | ORAL_CAPSULE | Freq: Two times a day (BID) | ORAL | 3 refills | Status: DC

## 2020-12-04 MED ORDER — INSULIN ASPART 100 UNIT/ML FLEXPEN: 0.0000 [IU] | PEN_INJECTOR | Freq: Three times a day (TID) | SUBCUTANEOUS | 11 refills | Status: DC

## 2020-12-04 MED ORDER — METHOCARBAMOL 500 MG PO TABS: 500.0000 mg | ORAL_TABLET | Freq: Three times a day (TID) | ORAL | 4 refills | Status: DC

## 2020-12-04 MED ORDER — BISACODYL 10 MG RE SUPP: 10.0000 mg | Freq: Every day | RECTAL | 0 refills | Status: DC | PRN

## 2020-12-04 MED ORDER — LISINOPRIL 10 MG PO TABS: 10.0000 mg | ORAL_TABLET | Freq: Every day | ORAL | 3 refills | Status: DC

## 2020-12-04 MED ORDER — ENOXAPARIN SODIUM 40 MG/0.4ML ~~LOC~~ SOLN: 40.0000 mg | SUBCUTANEOUS | Status: DC

## 2020-12-04 NOTE — Progress Notes (Signed)
Report called to Denton Ar, RN at [facility], Will get patient ready for transport

## 2020-12-04 NOTE — Discharge Summary (Signed)
[name], is a 85 y.o. female  DOB 1935/11/21  MRN [id number].  Admission date:  12/01/2020  Admitting Physician  Murlean Iba, MD  Discharge Date:  12/04/2020   Primary MD  Curlene Labrum, MD  Recommendations for primary care physician for things to follow:   1)Avoid ibuprofen/Advil/Aleve/Motrin/Goody Powders/Naproxen/BC powders/Meloxicam/Diclofenac/Indomethacin and other Nonsteroidal anti-inflammatory medications as these will make you more likely to bleed and can cause stomach ulcers, can also cause Kidney problems.   2)insulin aspart (novoLOG) injection 0-10 Units 0-10 Units Subcutaneous, 3 times daily with meals CBG < 70: Implement Hypoglycemia Standing Orders and refer to Hypoglycemia Standing Orders sidebar report  CBG 70 - 120: 0 unit CBG 121 - 150: 0 unit  CBG 151 - 200: 1 unit CBG 201 - 250: 2 units CBG 251 - 300: 4 units CBG 301 - 350: 6 units  CBG 351 - 400: 8 units  CBG > 400: 10 units  3)Follow up with Dr. Arther Abbott (Orthopedics) in 2 weeks   4)Repeat CBC and BMP in 1 week---   Admission Diagnosis  Leg pain [M79.606] Fall, initial encounter [W19.XXXA] Closed fracture of proximal end of left fibula, unspecified fracture morphology, initial encounter [S82.832A] Uncontrolled pain [R52]   Discharge Diagnosis  Leg pain [M79.606] Fall, initial encounter [W19.XXXA] Closed fracture of proximal end of left fibula, unspecified fracture morphology, initial encounter [S82.832A] Uncontrolled pain [R52]    Active Problems:   Leg pain   Left hip pain   Fibula fracture   Inability to bear weight   Diabetes mellitus without complication (HCC)   High cholesterol   Neuropathy   Essential hypertension   Hyperglycemia   AKI (acute kidney injury) ([place])   Normocytic anemia   Uncontrolled pain      Past Medical History:  Diagnosis Date  . Diabetes mellitus without complication  ([place])   . High cholesterol   . Neck pain   . Neuropathy     Past Surgical History:  Procedure Laterality Date  . ABDOMINAL HYSTERECTOMY    . APPENDECTOMY    . BACK SURGERY    . CERVICAL DISC SURGERY    . HAND SURGERY    . KNEE SURGERY       HPI  from the history and physical done on the day of admission:     Chief Complaint: Fall at home   HPI: [name] is a 85 y.o. female with medical history significant for type 2 diabetes mellitus, hyperlipidemia, normal diabetic neuropathy, chronic neck pain presenting to the emergency department after a fall at home.  She reported that she ended up losing balance when trying to make his son close.  She said that she fell on her left side.  She hit her head.  There was no loss of consciousness.  Since that time she has had severe left hip left knee and left ankle pain.  She was seen initially at an urgent care facility.  She was told that she may have had an  ankle fracture and was placed in a fracture boot.  She reports that she had no chest pain or shortness of breath dizziness that led to her potential to fall.  She has no visual changes and no focal weakness or numbness.  Her main complaint is severe pain inability to ambulate because of pain involving the left leg, knee and hip.    ED Course: SARS 2 coronavirus and influenza tests were negative.  CT of the cervical spine with no acute bony abnormality.  CT brain with no acute abnormality.  CT of the pelvis shows degenerative changes in the hips and avascular necrosis of hips.  Scan of the left knee without contrast with findings of subtle acute nondisplaced fracture through the medial aspect of the head of the fibula.  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  X-rays of the ankle medial shoulder and tib-fib on the left did not show any acute findings of fracture.    Basic metabolic panel showed glucose of 219, BUN 33, creatinine 1.13 with estimated GFR 49.  WBC 6.4  hemoglobin 11.3 platelet count 216.  Pt was started on pain management and admission was requested for further management.  Patient continued to have significant pain despite pain medication inability to ambulate.  Dr. Wyvonnia Dusky in ED ordered MRI of the hip and lumbar spine to rule out an occult fracture.   Hospital Course:      Brief Admission History:  85 y.o.femalewith medical history significantfor type 2 diabetes mellitus, hyperlipidemia, normal diabetic neuropathy, chronic neck pain presenting to the emergency department after a fall at home. She reported that she ended up losing balance when trying to make his son close. She said that she fell on her left side. She hit her head. There was no loss of consciousness. Since that time she has had severe left hip left knee and left ankle pain. She was seen initially at an urgent care facility. She was told that she may have had an ankle fracture and was placed in a fracture boot.She reports that she had no chest pain or shortness of breath dizziness that led to her potential to fall. She has no visual changes and no focal weakness or numbness. Her main complaint is severe pain inability to ambulate because of pain involving the left leg, knee and hip.   - A/p Acute left fibular head fracture - nondisplaced, continue knee immobilizer for now and pain management as ordered. PT evaluation appreciated- recommends SNF rehab orthopedics consultation appreciated  Inability to bear weight - likely multifactorial in setting ofbilateral chronic avascular necrosisandacute fibular head fracture.  Ok to use wheelchair.  -Medications adjusted, okay to continue physical therapy at SNF   L5-S1: Previous left hemilaminectomy.//Acute radicular syndrome --orthopedic consult appreciated -Improved with steroids -Discharge on p.o. steroid taper -Continue Neurontin, as needed oxycodone and methocarbamol  Left foraminal stenosis and  subarticular lateral recess stenosis could cause left-sided neural compression----May benefit from referral to neurosurgeon or orthospine for possible epidural shots   Left hip pain - CT hip with no acute findings---MRI lumbar spine also April 4 multilevel degenerative disc disease is seen there is foraminal stenosis at L3 -MRI hip December 02, 2020 with mild avascular necrosis both hips  hips  Continue pain management as ordered.   Uncontrolled type 2 diabetes mellitus with neuropathy - as evidenced by A1c of 8.6%.Continue carbohydrate modified diet. Continue basal insulin with supplemental sliding scale coverage -Anticipate improvement in glycemic control with steroid taper  Essential hypertension-resumed home  blood pressure l meds  Hyperlipidemia- continue statin  Polyneuropathy of diabetes mellitus- resume gabapentin   AKI - suspect prerenal with poor oral intake, -improved with hydration  Code Status:Full Family Communication:no family present during rounds, discussed plan of care with patient Disposition Plan: SNF Discharge Condition: Stable  Follow UP   Contact information for follow-up providers    Carole Civil, MD. Schedule an appointment as soon as possible for a visit in 2 week(s).   Specialties: Orthopedic Surgery, Radiology Contact information: [address] [place] [address] [phone]            Contact information for after-discharge care    [facility] .   Service: Skilled Nursing Contact information: [address] [place] [phone]                   Consults obtained -orthopedic surgeon  Diet and Activity recommendation:  As advised  Discharge Instructions    Discharge Instructions    Call MD for:  difficulty breathing, headache or visual disturbances   Complete by: As directed    Call MD for:  persistant dizziness or  light-headedness   Complete by: As directed    Call MD for:  persistant nausea and vomiting   Complete by: As directed    Call MD for:  redness, tenderness, or signs of infection (pain, swelling, redness, odor or green/yellow discharge around incision site)   Complete by: As directed    Call MD for:  severe uncontrolled pain   Complete by: As directed    Call MD for:  temperature >100.4   Complete by: As directed    Diet - low sodium heart healthy   Complete by: As directed    Diet Carb Modified   Complete by: As directed    Discharge instructions   Complete by: As directed    1)Avoid ibuprofen/Advil/Aleve/Motrin/Goody Powders/Naproxen/BC powders/Meloxicam/Diclofenac/Indomethacin and other Nonsteroidal anti-inflammatory medications as these will make you more likely to bleed and can cause stomach ulcers, can also cause Kidney problems.   2)insulin aspart (novoLOG) injection 0-10 Units 0-10 Units Subcutaneous, 3 times daily with meals CBG < 70: Implement Hypoglycemia Standing Orders and refer to Hypoglycemia Standing Orders sidebar report  CBG 70 - 120: 0 unit CBG 121 - 150: 0 unit  CBG 151 - 200: 1 unit CBG 201 - 250: 2 units CBG 251 - 300: 4 units CBG 301 - 350: 6 units  CBG 351 - 400: 8 units  CBG > 400: 10 units  3)Follow up with Dr. Arther Abbott (Orthopedics) in 2 weeks   4)Repeat CBC and BMP in 1 week---   Increase activity slowly   Complete by: As directed        Discharge Medications     Allergies as of 12/04/2020      Reactions   Codeine       Medication List    TAKE these medications   acetaminophen 325 MG tablet Commonly known as: TYLENOL Take 2 tablets (650 mg total) by mouth every 6 (six) hours.   albuterol 108 (90 Base) MCG/ACT inhaler Commonly known as: VENTOLIN HFA Inhale 1-2 puffs into the lungs every 4 (four) hours as needed for wheezing or shortness of breath.   atorvastatin 20 MG tablet Commonly known as: LIPITOR Take 1 tablet (20 mg total) by  mouth every evening. What changed: when to take this   bisacodyl 10 MG suppository  Commonly known as: DULCOLAX Place 1 suppository (10 mg total) rectally daily as needed for moderate constipation.   gabapentin 300 MG capsule Commonly known as: NEURONTIN Take 1 capsule (300 mg total) by mouth 2 (two) times daily. What changed:   medication strength  how much to take  when to take this   insulin aspart 100 UNIT/ML FlexPen Commonly known as: NOVOLOG Inject 0-10 Units into the skin 3 (three) times daily with meals. insulin aspart (novoLOG) injection 0-10 Units 0-10 Units Subcutaneous, 3 times daily with meals CBG < 70: Implement Hypoglycemia Standing Orders and refer to Hypoglycemia Standing Orders sidebar report  CBG 70 - 120: 0 unit CBG 121 - 150: 0 unit  CBG 151 - 200: 1 unit CBG 201 - 250: 2 units CBG 251 - 300: 4 units CBG 301 - 350: 6 units  CBG 351 - 400: 8 units  CBG > 400: 10 units   Lantus SoloStar 100 UNIT/ML Solostar Pen Generic drug: insulin glargine Inject 30 Units into the skin daily.   lisinopril 10 MG tablet Commonly known as: ZESTRIL Take 1 tablet (10 mg total) by mouth daily.   metFORMIN 500 MG tablet Commonly known as: GLUCOPHAGE Take 500 mg by mouth 2 (two) times daily with a meal.   methocarbamol 500 MG tablet Commonly known as: ROBAXIN Take 1 tablet (500 mg total) by mouth 3 (three) times daily.   multivitamin with minerals Tabs tablet Take 1 tablet by mouth daily.   oxyCODONE 5 MG immediate release tablet Commonly known as: Oxy IR/ROXICODONE Take 1 tablet (5 mg total) by mouth every 6 (six) hours as needed for moderate pain.   pantoprazole 40 MG tablet Commonly known as: PROTONIX Take 40 mg by mouth daily.   predniSONE 20 MG tablet Commonly known as: DELTASONE Take 1 tablet (20 mg total) by mouth daily with breakfast for 5 days.   senna-docusate 8.6-50 MG tablet Commonly known as: Senokot-S Take 2 tablets by mouth at bedtime.             Durable Medical Equipment  (From admission, onward)         Start     Ordered   12/03/20 1240  For home use only DME Walker rolling  Once       Question Answer Comment  Walker: With 5 Inch Wheels   Patient needs a walker to treat with the following condition Gait instability   Patient needs a walker to treat with the following condition Uncontrolled pain      12/03/20 1239          Major procedures and Radiology Reports - PLEASE review detailed and final reports for all details, in brief -  DG Shoulder Right  Result Date: 11/19/2020 CLINICAL DATA:  Fall with right shoulder pain. EXAM: RIGHT SHOULDER - 2+ VIEW COMPARISON:  10/10/2019 from [place]. FINDINGS: No acute fracture or dislocation. Incompletely imaged cervical spine fixation. IMPRESSION: No acute osseous abnormality. Electronically Signed   By: Abigail Miyamoto M.D.   On: 11/19/2020 18:10   DG Tibia/Fibula Left  Result Date: 12/02/2020 CLINICAL DATA:  Pain status post fall. EXAM: LEFT TIBIA AND FIBULA - 2 VIEW; LEFT FEMUR 2 VIEWS; LEFT KNEE - COMPLETE 4+ VIEW; LEFT ANKLE COMPLETE - 3+ VIEW COMPARISON:  None. FINDINGS: There is mild soft tissue swelling about the patient's ankle without evidence for an acute displaced fracture or dislocation. There are degenerative changes of the ankle mortise. There is a moderate-sized plantar  calcaneal spur. Vascular calcifications are noted. There are degenerative changes of the knee without evidence for significant joint effusion or acute displaced fracture. There is no acute osseous abnormality involving the patient's left femur. There is no hip dislocation. Mild degenerative changes are noted of the patient's left hip. Vascular calcifications are noted. IMPRESSION: 1. No acute displaced fracture or dislocation involving the patient's left lower extremity. 2. Mild soft tissue swelling about the ankle. 3. Degenerative changes of the knee and hip. Electronically Signed    By: Constance Holster M.D.   On: 12/02/2020 01:19   DG Ankle Complete Left  Result Date: 12/02/2020 CLINICAL DATA:  Pain status post fall. EXAM: LEFT TIBIA AND FIBULA - 2 VIEW; LEFT FEMUR 2 VIEWS; LEFT KNEE - COMPLETE 4+ VIEW; LEFT ANKLE COMPLETE - 3+ VIEW COMPARISON:  None. FINDINGS: There is mild soft tissue swelling about the patient's ankle without evidence for an acute displaced fracture or dislocation. There are degenerative changes of the ankle mortise. There is a moderate-sized plantar calcaneal spur. Vascular calcifications are noted. There are degenerative changes of the knee without evidence for significant joint effusion or acute displaced fracture. There is no acute osseous abnormality involving the patient's left femur. There is no hip dislocation. Mild degenerative changes are noted of the patient's left hip. Vascular calcifications are noted. IMPRESSION: 1. No acute displaced fracture or dislocation involving the patient's left lower extremity. 2. Mild soft tissue swelling about the ankle. 3. Degenerative changes of the knee and hip. Electronically Signed   By: Constance Holster M.D.   On: 12/02/2020 01:19   CT Head Wo Contrast  Result Date: 12/02/2020 CLINICAL DATA:  Fall EXAM: CT HEAD WITHOUT CONTRAST TECHNIQUE: Contiguous axial images were obtained from the base of the skull through the vertex without intravenous contrast. COMPARISON:  08/20/2019 FINDINGS: Brain: No acute intracranial abnormality. Specifically, no hemorrhage, hydrocephalus, mass lesion, acute infarction, or significant intracranial injury. Vascular: No hyperdense vessel or unexpected calcification. Skull: No acute calvarial abnormality. Sinuses/Orbits: Polypoid filling defects in both maxillary sinuses. No air-fluid levels. Other: None IMPRESSION: No acute intracranial abnormality. Polypoid sinus disease. Electronically Signed   By: Rolm Baptise M.D.   On: 12/02/2020 01:45   CT Cervical Spine Wo Contrast  Result Date:  12/02/2020 CLINICAL DATA:  Fall EXAM: CT CERVICAL SPINE WITHOUT CONTRAST TECHNIQUE: Multidetector CT imaging of the cervical spine was performed without intravenous contrast. Multiplanar CT image reconstructions were also generated. COMPARISON:  08/20/2019 FINDINGS: Alignment: No subluxation. Skull base and vertebrae: No acute fracture. No primary bone lesion or focal pathologic process. Soft tissues and spinal canal: No prevertebral fluid or swelling. No visible canal hematoma. Disc levels: Prior anterior fusion from C4-C6. Advanced degenerative disc disease at C3-4 and C6-7. Mild bilateral diffuse degenerative facet disease. Upper chest: Biapical scarring. Other: None IMPRESSION: Postoperative and degenerative changes in the cervical spine. No acute bony abnormality. Electronically Signed   By: Rolm Baptise M.D.   On: 12/02/2020 01:46   CT PELVIS WO CONTRAST  Result Date: 12/02/2020 CLINICAL DATA:  Left hip pain after a fall on 11/19/2020. EXAM: CT PELVIS WITHOUT CONTRAST TECHNIQUE: Multidetector CT imaging of the pelvis was performed following the standard protocol without intravenous contrast. COMPARISON:  CT abdomen and pelvis 10/11/2018 FINDINGS: Urinary Tract:  No bladder wall thickening or filling defect. Bowel: Visualized portions of large and small bowel are not abnormally distended. Scattered stool in the colon. Sigmoid colonic diverticula without evidence of diverticulitis. Appendix is not identified. Vascular/Lymphatic: Vascular calcifications  in the iliac arteries. No aneurysm. Reproductive: Surgical absence of the uterus. No abnormal adnexal masses. Other: No free fluid in the pelvis. No significant pelvic lymphadenopathy. Musculoskeletal: Degenerative changes in the lower lumbar spine. 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 No evidence of acute fracture or dislocation of the pelvis or hips. Sacrum appears intact. Degenerative changes in the hips  with subcortical cysts on the left hip. IMPRESSION: 1. No evidence of acute fracture or dislocation of the pelvis or hips. 2. 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 3. Degenerative changes in the hips with subcortical cysts on the left hip. Electronically Signed   By: Lucienne Capers M.D.   On: 12/02/2020 01:50   CT Knee Left Wo Contrast  Result Date: 12/02/2020 CLINICAL DATA:  Knee pain EXAM: CT OF THE LEFT KNEE WITHOUT CONTRAST TECHNIQUE: Multidetector CT imaging of the LEFT knee was performed according to the standard protocol. Multiplanar CT image reconstructions were also generated. COMPARISON:  None. FINDINGS: Bones/Joint/Cartilage There is a subtle acute nondisplaced fracture through the medial aspect of the head of the fibula (axial series 4, image 99). 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 There are intra-articular loose bodies. Ligaments Suboptimally assessed by CT. Muscles and Tendons There is acute intramuscular abnormality. Soft tissues There is a small joint effusion. IMPRESSION: 1. Subtle acute nondisplaced fracture through the medial aspect of the head of the fibula. 2. 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 3. There is a small joint effusion. Electronically Signed   By: Constance Holster M.D.   On: 12/02/2020 03:34   MR LUMBAR SPINE WO CONTRAST  Result Date: 12/02/2020 CLINICAL DATA:  Low back and left hip pain.  Multiple falls. EXAM: MRI LUMBAR SPINE WITHOUT CONTRAST TECHNIQUE: Multiplanar, multisequence MR imaging of the lumbar spine was performed. No intravenous contrast was administered. COMPARISON:  01/10/2008 FINDINGS: Segmentation:  5 lumbar type vertebral bodies. Alignment:  2 mm degenerative anterolisthesis L4-5. Vertebrae:  No fracture or primary bone lesion. Conus medullaris and cauda equina: Conus extends to the L1-2 level. Conus and cauda equina appear normal. Paraspinal and other soft  tissues: Negative Disc levels: No disc abnormality at L2-3 or above. At L2-3, patient has mild to moderate bilateral facet osteoarthritis. At L3-4 there is mild bulging of the disc in both posterolateral directions, more prominent on the left, with mild left foraminal encroachment that could possibly affect the left L3 nerve. 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 These findings could relate to back pain or referred facet syndrome pain. L4-5: Bilateral facet arthropathy with 2 mm of anterolisthesis. Desiccation and bulging of the disc more prominent towards the right. Mild narrowing of both lateral recesses and of the intervertebral foramen on right, but without visible neural compression. Findings at this level could relate to back pain or referred facet syndrome. L5-S1: Previous left hemilaminectomy. Desiccation of the disc with loss of height. Endplate osteophytes and bulging of the disc slightly more prominent towards the left. Mild facet osteoarthritis on the left. Moderate facet degeneration and hypertrophy on the right. Left foraminal stenosis and subarticular lateral recess stenosis could cause left-sided neural compression. IMPRESSION: 1. 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 2. L3-4: Bilateral posterolateral disc bulges, more prominent on the left. Mild left foraminal encroachment that could possibly affect the left L3 nerve. Moderate to severe bilateral facet osteoarthritis. These findings could relate to back pain or referred facet syndrome pain. 3. L4-5: Bilateral facet arthropathy with 2  mm of anterolisthesis. Bulging of the disc more prominent towards the right. Mild narrowing of the lateral recesses and of the intervertebral foramen on the right, but without visible neural compression. Findings at this level could relate to back pain or referred facet syndrome pain. 4. L5-S1: Previous left hemilaminectomy. Endplate osteophytes and bulging of the disc more  prominent towards the left. Facet degeneration and hypertrophy left more than right with left foraminal stenosis and subarticular lateral recess stenosis could cause left-sided neural compression. Electronically Signed   By: Nelson Chimes M.D.   On: 12/02/2020 13:26   MR HIP LEFT WO CONTRAST  Result Date: 12/02/2020 CLINICAL DATA:  Low back pain and left hip pain.  Multiple falls EXAM: MR OF THE LEFT HIP WITHOUT CONTRAST TECHNIQUE: Multiplanar, multisequence MR imaging was performed. No intravenous contrast was administered. COMPARISON:  CT pelvis 12/02/2020 FINDINGS: Despite efforts by the technologist and patient, motion artifact is present on today's exam and could not be eliminated. This reduces exam sensitivity and specificity. The standard small field-of-view coronal proton density fat saturated sequence could not be acquired (series 18 is a nondiagnostic sagittal series although it is labeled "COR PD FS") due to patient discomfort. Bones: There is evidence of mild chronic bilateral hip avascular necrosis without significant marrow edema or findings of collapse. Degenerative subcortical cystic lesions are present along the left femoral head and adjacent acetabulum. Mild associated spurring. No hip fracture or generalized marrow edema signal is identified. Articular cartilage and labrum Articular cartilage: Moderate degenerative chondral thinning in both hips. Labrum: No paralabral cyst identified. Today's exam is not considered accurate in assessing for small labral tears due to motion artifact and other factors. Joint or bursal effusion Joint effusion:  Absent Bursae: No regional bursitis Muscles and tendons Muscles and tendons: Mild proximal hamstring tendinopathy, left greater than right. Other findings Miscellaneous: 1.3 cm left eccentric Bartholin's cyst versus Gartner cyst. IMPRESSION: 1. No acute findings involving the left hip. 2. Mild chronic bilateral hip avascular necrosis. Moderate degenerative  hip findings bilaterally. No hip effusion or regional bursitis. 3. Mild left greater than right hamstring tendinopathy. 4. 1.3 cm left eccentric Bartholin's cyst or Gartner cyst. Electronically Signed   By: Van Clines M.D.   On: 12/02/2020 15:28   DG Knee Complete 4 Views Left  Result Date: 12/02/2020 CLINICAL DATA:  Pain status post fall. EXAM: LEFT TIBIA AND FIBULA - 2 VIEW; LEFT FEMUR 2 VIEWS; LEFT KNEE - COMPLETE 4+ VIEW; LEFT ANKLE COMPLETE - 3+ VIEW COMPARISON:  None. FINDINGS: There is mild soft tissue swelling about the patient's ankle without evidence for an acute displaced fracture or dislocation. There are degenerative changes of the ankle mortise. There is a moderate-sized plantar calcaneal spur. Vascular calcifications are noted. There are degenerative changes of the knee without evidence for significant joint effusion or acute displaced fracture. There is no acute osseous abnormality involving the patient's left femur. There is no hip dislocation. Mild degenerative changes are noted of the patient's left hip. Vascular calcifications are noted. IMPRESSION: 1. No acute displaced fracture or dislocation involving the patient's left lower extremity. 2. Mild soft tissue swelling about the ankle. 3. Degenerative changes of the knee and hip. Electronically Signed   By: Constance Holster M.D.   On: 12/02/2020 01:19   DG Femur Min 2 Views Left  Result Date: 12/02/2020 CLINICAL DATA:  Pain status post fall. EXAM: LEFT TIBIA AND FIBULA - 2 VIEW; LEFT FEMUR 2 VIEWS; LEFT KNEE - COMPLETE 4+ VIEW;  LEFT ANKLE COMPLETE - 3+ VIEW COMPARISON:  None. FINDINGS: There is mild soft tissue swelling about the patient's ankle without evidence for an acute displaced fracture or dislocation. There are degenerative changes of the ankle mortise. There is a moderate-sized plantar calcaneal spur. Vascular calcifications are noted. There are degenerative changes of the knee without evidence for significant joint  effusion or acute displaced fracture. There is no acute osseous abnormality involving the patient's left femur. There is no hip dislocation. Mild degenerative changes are noted of the patient's left hip. Vascular calcifications are noted. IMPRESSION: 1. No acute displaced fracture or dislocation involving the patient's left lower extremity. 2. Mild soft tissue swelling about the ankle. 3. Degenerative changes of the knee and hip. Electronically Signed   By: Constance Holster M.D.   On: 12/02/2020 01:19    Micro Results   Recent Results (from the past 240 hour(s))  Resp Panel by RT-PCR (Flu A&B, Covid) Nasopharyngeal Swab     Status: None   Collection Time: 12/02/20  5:00 AM   Specimen: Nasopharyngeal Swab; Nasopharyngeal(NP) swabs in vial transport medium  Result Value Ref Range Status   SARS Coronavirus 2 by RT PCR NEGATIVE NEGATIVE Final    Comment: (NOTE) SARS-CoV-2 target nucleic acids are NOT DETECTED.  The SARS-CoV-2 RNA is generally detectable in upper respiratory specimens during the acute phase of infection. The lowest concentration of SARS-CoV-2 viral copies this assay can detect is 138 copies/mL. A negative result does not preclude SARS-Cov-2 infection and should not be used as the sole basis for treatment or other patient management decisions. A negative result may occur with  improper specimen collection/handling, submission of specimen other than nasopharyngeal swab, presence of viral mutation(s) within the areas targeted by this assay, and inadequate number of viral copies(<138 copies/mL). A negative result must be combined with clinical observations, patient history, and epidemiological information. The expected result is Negative.  Fact Sheet for Patients:  EntrepreneurPulse.com.au  Fact Sheet for Healthcare Providers:  IncredibleEmployment.be  This test is no t yet approved or cleared by the [place] FDA and  has been  authorized for detection and/or diagnosis of SARS-CoV-2 by FDA under an Emergency Use Authorization (EUA). This EUA will remain  in effect (meaning this test can be used) for the duration of the COVID-19 declaration under Section 564(b)(1) of the Act, 21 U.S.C.section 360bbb-3(b)(1), unless the authorization is terminated  or revoked sooner.       Influenza A by PCR NEGATIVE NEGATIVE Final   Influenza B by PCR NEGATIVE NEGATIVE Final    Comment: (NOTE) The Xpert Xpress SARS-CoV-2/FLU/RSV plus assay is intended as an aid in the diagnosis of influenza from Nasopharyngeal swab specimens and should not be used as a sole basis for treatment. Nasal washings and aspirates are unacceptable for Xpert Xpress SARS-CoV-2/FLU/RSV testing.  Fact Sheet for Patients: EntrepreneurPulse.com.au  Fact Sheet for Healthcare Providers: IncredibleEmployment.be  This test is not yet approved or cleared by the [place] FDA and has been authorized for detection and/or diagnosis of SARS-CoV-2 by FDA under an Emergency Use Authorization (EUA). This EUA will remain in effect (meaning this test can be used) for the duration of the COVID-19 declaration under Section 564(b)(1) of the Act, 21 U.S.C. section 360bbb-3(b)(1), unless the authorization is terminated or revoked.  Performed at [facility], [address] [place], [place] [address]     Today   [place] today has no new complaints, -Pain control improving No fever  Or chills    Patient has been seen and examined prior to discharge   Objective   Blood pressure (!) 155/49, pulse (!) 59, temperature 97.9 F (36.6 C), temperature source Oral, resp. rate 17, height '5\' 2"'$  (1.575 m), weight 74.6 kg, SpO2 98 %.   Intake/Output Summary (Last 24 hours) at 12/04/2020 1341 Last data filed at 12/04/2020 0940 Gross per 24 hour  Intake 732.17 ml  Output 1700 ml  Net -967.83 ml    Exam Gen:-  Awake Alert, no acute distress  HEENT:- [place].AT, No sclera icterus Neck-Supple Neck,No JVD,.  Lungs-  CTAB , good air movement bilaterally  CV- S1, S2 normal, regular Abd-  +ve B.Sounds, Abd Soft, No tenderness,    Extremity/Skin:- No  edema,   good pulses Psych-affect is appropriate, oriented x3 Neuro-generalized weakness, no new focal deficits, no tremors  MSK-left knee pain and range of motion improvement, back and hip pain with range of motion persist   Data Review   CBC w Diff:  Lab Results  Component Value Date   WBC 6.5 12/03/2020   HGB 9.8 (L) 12/03/2020   HCT 30.5 (L) 12/03/2020   PLT 182 12/03/2020   LYMPHOPCT 40 12/02/2020   MONOPCT 8 12/02/2020   EOSPCT 2 12/02/2020   BASOPCT 0 12/02/2020    CMP:  Lab Results  Component Value Date   NA 138 12/04/2020   NA 136 08/17/2019   K 4.3 12/04/2020   CL 106 12/04/2020   CO2 22 12/04/2020   BUN 29 (H) 12/04/2020   BUN 30 (H) 08/17/2019   CREATININE 1.00 12/04/2020   PROT 6.8 08/17/2019   ALBUMIN 4.3 08/17/2019   BILITOT 0.2 08/17/2019   ALKPHOS 84 08/17/2019   AST 21 08/17/2019   ALT 16 08/17/2019  .   Total Discharge time is about 33 minutes  Roxan Hockey M.D on 12/04/2020 at 1:41 PM  Go to [email or web address] -  for contact info  [facility] - Office  [phone]

## 2020-12-04 NOTE — Progress Notes (Signed)
Physical Therapy Treatment Patient Details Name: [name] MRN: [id number] DOB: [birth year] Today's Date: 12/04/2020    History of Present Illness [name] is a 85 y.o. female.  Patient presents via EMS with left hip and leg pain after a fall this morning.  States she lost her balance will try to take her sock off and fell to the ground on her left side.  Did hit her head but did not lose consciousness.  Complains of pain to her left hip and left knee and left ankle.  She was seen in urgent care and told she had a questionable fracture in her ankle and placed in a fracture boot.  They did not x-ray her hip by her report.  He denies any preceding dizziness or lightheadedness.  No chest pain or shortness of breath.  Complains of pain in her left hip that radiates down her left leg involving her knee and ankle as well.  Denies any neck or back pain.  No blood thinner use.  No focal weakness, numbness or tingling    PT Comments    Patient demonstrates improvement for moving LLE while completing exercises supine in bed and while seated at EOB with less c/o pain in LLE.  Patient continues to c/o severe left knee pain when weightbearing and limited to a few side steps at bedside before having to sit.  Patient tolerated sitting up in chair after therapy - RN notified.  Patient will benefit from continued physical therapy in hospital and recommended venue below to increase strength, balance, endurance for safe ADLs and gait.    Follow Up Recommendations  SNF     Equipment Recommendations  Rolling walker with 5" wheels    Recommendations for Other Services       Precautions / Restrictions Precautions Precautions: Fall Required Braces or Orthoses: Knee Immobilizer - Right (If patient can tolerate having it on) Knee Immobilizer - Right: On when out of bed or walking Restrictions Weight Bearing Restrictions: No    Mobility  Bed Mobility Overal bed mobility: Needs Assistance Bed  Mobility: Supine to Sit     Supine to sit: Min assist     General bed mobility comments: increased time, labored movement, had to use bed rail    Transfers Overall transfer level: Needs assistance Equipment used: Rolling walker (2 wheeled) Transfers: Sit to/from [facility] Sit to Stand: Min assist;Mod assist Stand pivot transfers: Mod assist       General transfer comment: slight improvement for tolerating weightbearing on LLE, very unsteady with frequent buckling of left knee  Ambulation/Gait Ambulation/Gait assistance: Mod assist Gait Distance (Feet): 5 Feet Assistive device: Rolling walker (2 wheeled) Gait Pattern/deviations: Decreased step length - right;Decreased step length - left;Decreased stance time - left;Decreased stride length;Antalgic Gait velocity: decreased   General Gait Details: very unsteady on feet with frequent buckling of left knee when taking steps due to increased pain   Stairs             Wheelchair Mobility    Modified Rankin (Stroke Patients Only)       Balance Overall balance assessment: Needs assistance Sitting-balance support: Feet supported;No upper extremity supported Sitting balance-Leahy Scale: Good Sitting balance - Comments: seated at EOB   Standing balance support: During functional activity;Bilateral upper extremity supported Standing balance-Leahy Scale: Poor Standing balance comment: fair/poor using RW  Cognition Arousal/Alertness: Awake/alert Behavior During Therapy: WFL for tasks assessed/performed Overall Cognitive Status: Within Functional Limits for tasks assessed                                        Exercises General Exercises - Lower Extremity Ankle Circles/Pumps: Supine;15 reps;Both;Strengthening;AROM Heel Slides: Supine;Strengthening;AROM;15 reps;Both Hip Flexion/Marching: Seated;AROM;Strengthening;Both;10 reps Toe Raises:  Seated;AROM;Strengthening;Both;10 reps Heel Raises: Seated;AROM;Strengthening;Both;10 reps    General Comments        Pertinent Vitals/Pain Pain Assessment: 0-10 Pain Score: 8  Pain Location: left knee mostly Pain Descriptors / Indicators: Sore;Grimacing;Discomfort Pain Intervention(s): Limited activity within patient's tolerance;Monitored during session;Repositioned;Premedicated before session    Home Living                      Prior Function            PT Goals (current goals can now be found in the care plan section) Acute Rehab PT Goals Patient Stated Goal: return home after rehab PT Goal Formulation: With patient Time For Goal Achievement: 12/16/20 Potential to Achieve Goals: Good Progress towards PT goals: Progressing toward goals    Frequency    Min 3X/week      PT Plan Current plan remains appropriate    Co-evaluation              AM-PAC PT "6 Clicks" Mobility   Outcome Measure  Help needed turning from your back to your side while in a flat bed without using bedrails?: A Little Help needed moving from lying on your back to sitting on the side of a flat bed without using bedrails?: A Lot Help needed moving to and from a bed to a chair (including a wheelchair)?: A Lot Help needed standing up from a chair using your arms (e.g., wheelchair or bedside chair)?: A Lot Help needed to walk in hospital room?: A Lot Help needed climbing 3-5 steps with a railing? : Total 6 Click Score: 12    End of Session   Activity Tolerance: Patient tolerated treatment well;Patient limited by fatigue;Patient limited by pain Patient left: in chair;with call bell/phone within reach Nurse Communication: Mobility status PT Visit Diagnosis: Unsteadiness on feet (R26.81);Muscle weakness (generalized) (M62.81);Other abnormalities of gait and mobility (R26.89)     Time: [id number] PT Time Calculation (min) (ACUTE ONLY): 25 min  Charges:  $Therapeutic Exercise:  8-22 mins $Therapeutic Activity: 8-22 mins                     2:49 PM, 12/04/20 Lonell Grandchild, MPT Physical Therapist with [facility] 336 [phone] office [phone] mobile phone

## 2020-12-04 NOTE — TOC Transition Note (Signed)
Transition of Care [facility]) - CM/SW Discharge Note   Patient Details  Name: STINA PONTHIEUX MRN: [id number] Date of Birth: 1936/01/11  Transition of Care [facility]) CM/SW Contact:  Shade Flood, LCSW Phone Number: 12/04/2020, 1:21 PM   Clinical Narrative:     Pt stable for dc. [name] at [facility] states that they have insurance auth and can admit pt today. Spoke with pt and family. They remain agreeable to dc plan.  DC clinical sent electronically. RN to call report. There are no other TOC needs for dc.  Final next level of care: Skilled Nursing Facility Barriers to Discharge: Barriers Resolved   Patient Goals and CMS Choice Patient states their goals for this hospitalization and ongoing recovery are:: short term SNF   Choice offered to / list presented to : Patient  Discharge Placement              Patient chooses bed at: [facility] Patient to be transferred to facility by: W/C Name of family member notified: [name] Patient and family notified of of transfer: 12/04/20  Discharge Plan and Services In-house Referral: Clinical Social Work   Post Acute Care Choice: Nursing Home          DME Arranged: N/A DME Agency: NA                  Social Determinants of Health ([place]) Interventions     Readmission Risk Interventions Readmission Risk Prevention Plan 12/04/2020  Medication Screening Complete  Transportation Screening Complete  Some recent data might be hidden

## 2020-12-05 ENCOUNTER — Encounter: Payer: Self-pay | Admitting: Adult Health

## 2020-12-05 ENCOUNTER — Non-Acute Institutional Stay (SKILLED_NURSING_FACILITY): Payer: Medicare HMO | Admitting: Adult Health

## 2020-12-05 DIAGNOSIS — Z794 Long term (current) use of insulin: Secondary | ICD-10-CM

## 2020-12-05 DIAGNOSIS — E1159 Type 2 diabetes mellitus with other circulatory complications: Secondary | ICD-10-CM

## 2020-12-05 DIAGNOSIS — I152 Hypertension secondary to endocrine disorders: Secondary | ICD-10-CM

## 2020-12-05 DIAGNOSIS — E1169 Type 2 diabetes mellitus with other specified complication: Secondary | ICD-10-CM | POA: Diagnosis not present

## 2020-12-05 DIAGNOSIS — S82832S Other fracture of upper and lower end of left fibula, sequela: Secondary | ICD-10-CM

## 2020-12-05 DIAGNOSIS — E1142 Type 2 diabetes mellitus with diabetic polyneuropathy: Secondary | ICD-10-CM | POA: Diagnosis not present

## 2020-12-05 DIAGNOSIS — K5909 Other constipation: Secondary | ICD-10-CM

## 2020-12-05 DIAGNOSIS — M87051 Idiopathic aseptic necrosis of right femur: Secondary | ICD-10-CM

## 2020-12-05 DIAGNOSIS — M5416 Radiculopathy, lumbar region: Secondary | ICD-10-CM | POA: Diagnosis not present

## 2020-12-05 DIAGNOSIS — K219 Gastro-esophageal reflux disease without esophagitis: Secondary | ICD-10-CM

## 2020-12-05 DIAGNOSIS — E785 Hyperlipidemia, unspecified: Secondary | ICD-10-CM

## 2020-12-05 DIAGNOSIS — E1140 Type 2 diabetes mellitus with diabetic neuropathy, unspecified: Secondary | ICD-10-CM | POA: Insufficient documentation

## 2020-12-05 DIAGNOSIS — M87052 Idiopathic aseptic necrosis of left femur: Secondary | ICD-10-CM

## 2020-12-05 NOTE — Progress Notes (Signed)
Location:  [place] Room Number: 159 Place of Service:  SNF (31)   CODE STATUS: full code   Allergies  Allergen Reactions  . Codeine     Chief Complaint  Patient presents with  . Hospitalization Follow-up    HPI:  She is a 85 year old woman who has been hospitalized from 12-01-20 through 12-04-20. Her medical history includes: diabetes; chronic neck pain; neuropathy; hypertension. She had a fall on her left side at home. She did hit her head with no loss of consciousness. She had significant left leg pain; she initially went to urgent care; and was placed in ankle fracture boot. Her pain is severe and she is unable to ambulate. She was found to have a closed fracture proximal end of left fibula. Orthopedics did consult; is splinted and recommended SNF for rehab. 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 Her history includes: L5 S1 previous left hemilaminectomy and acute radicular syndrome was placed on steroids and did improve. She has left foraminal stenosis and subarticular lateral recess: she may need a neurosurgeon consult for possible epidural injections.  She is here for short term rehab with her goal to return back home. She denies any left leg pain at this time. She denies problems with constipation; denies any changes in appetite. She will continue to be followed for her chronic illnesses including: Hyperlipidemia associated with type 2 diabetes mellitus:  Type 2 diabetes mellitus with diabetic neuropathy with long term current use of insulin:Hypertension associated with diabetes:  Past Medical History:  Diagnosis Date  . Diabetes mellitus without complication ([place])   . High cholesterol   . Neck pain   . Neuropathy     Past Surgical History:  Procedure Laterality Date  . ABDOMINAL HYSTERECTOMY    . APPENDECTOMY    . BACK SURGERY    . CERVICAL DISC SURGERY    . HAND SURGERY    . KNEE SURGERY      Social  History   Socioeconomic History  . Marital status: Widowed    Spouse name: Not on file  . Number of children: Not on file  . Years of education: Not on file  . Highest education level: Not on file  Occupational History  . Not on file  Tobacco Use  . Smoking status: Never Smoker  . Smokeless tobacco: Never Used  Vaping Use  . Vaping Use: Never used  Substance and Sexual Activity  . Alcohol use: Never  . Drug use: Never  . Sexual activity: Never  Other Topics Concern  . Not on file  Social History Narrative  . Not on file   Social Determinants of Health   Financial Resource Strain: Not on file  Food Insecurity: Not on file  Transportation Needs: Not on file  Physical Activity: Not on file  Stress: Not on file  Social Connections: Not on file  Intimate Partner Violence: Not on file   Family History  Problem Relation Age of Onset  . Cardiomyopathy Father   . Cancer Mother       VITAL SIGNS BP (!) 114/51   Pulse 62   Temp 98.3 F (36.8 C)   Resp 18   Ht '5\' 2"'$  (1.575 m)   Wt 164 lb (74.4 kg)   SpO2 95%   BMI 30.00 kg/m   Outpatient Encounter Medications as of 12/05/2020  Medication Sig Note  . insulin aspart (NOVOLOG) 100 UNIT/ML injection Inject 5  Units into the skin 3 (three) times daily before meals.   Marland Kitchen acetaminophen (TYLENOL) 325 MG tablet Take 2 tablets (650 mg total) by mouth every 6 (six) hours.   Marland Kitchen albuterol (VENTOLIN HFA) 108 (90 Base) MCG/ACT inhaler Inhale 1-2 puffs into the lungs every 4 (four) hours as needed for wheezing or shortness of breath.   Marland Kitchen atorvastatin (LIPITOR) 20 MG tablet Take 1 tablet (20 mg total) by mouth every evening.   . bisacodyl (DULCOLAX) 10 MG suppository Place 1 suppository (10 mg total) rectally daily as needed for moderate constipation.   . gabapentin (NEURONTIN) 300 MG capsule Take 1 capsule (300 mg total) by mouth 2 (two) times daily.   Marland Kitchen LANTUS SOLOSTAR 100 UNIT/ML Solostar Pen Inject 30 Units into the skin daily.   Marland Kitchen  lisinopril (ZESTRIL) 10 MG tablet Take 1 tablet (10 mg total) by mouth daily.   . metFORMIN (GLUCOPHAGE) 500 MG tablet Take 500 mg by mouth 2 (two) times daily with a meal.   . methocarbamol (ROBAXIN) 500 MG tablet Take 1 tablet (500 mg total) by mouth 3 (three) times daily.   . Multiple Vitamin (MULTIVITAMIN WITH MINERALS) TABS tablet Take 1 tablet by mouth daily. 12/02/2020: Tries but does not take this regularly  . oxyCODONE (OXY IR/ROXICODONE) 5 MG immediate release tablet Take 1 tablet (5 mg total) by mouth every 6 (six) hours as needed for moderate pain.   . pantoprazole (PROTONIX) 40 MG tablet Take 40 mg by mouth daily.   . predniSONE (DELTASONE) 20 MG tablet Take 1 tablet (20 mg total) by mouth daily with breakfast for 5 days.   Marland Kitchen senna-docusate (SENOKOT-S) 8.6-50 MG tablet Take 2 tablets by mouth at bedtime.    No facility-administered encounter medications on file as of 12/05/2020.     SIGNIFICANT DIAGNOSTIC EXAMS  TODAY  12-02-20; ct of head: No acute intracranial abnormality. Polypoid sinus disease.  12-02-20: ct of cervical spine:  Postoperative and degenerative changes in the cervical spine. No acute bony abnormality.  12-02-20; ct of pelvis:  1. No evidence of acute fracture or dislocation of the pelvis or hips. 2. 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 3. Degenerative changes in the hips with subcortical cysts on the left hip.  12-02-20: ct of left knee:  1. Subtle acute nondisplaced fracture through the medial aspect of the head of the fibula. 2. 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 3. There is a small joint effusion.  12-02-20: mri left hip:  1. No acute findings involving the left hip. 2. Mild chronic bilateral hip avascular necrosis. Moderate degenerative hip findings bilaterally. No hip effusion or regional bursitis. 3. Mild left greater than right hamstring tendinopathy. 4. 1.3 cm left  eccentric Bartholin's cyst or Gartner cyst.  12-02-20: mri lumbar spine:  1. 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 2. L3-4: Bilateral posterolateral disc bulges, more prominent on the left. Mild left foraminal encroachment that could possibly affect the left L3 nerve. Moderate to severe bilateral facet osteoarthritis. These findings could relate to back pain or referred facet syndrome pain. 3. L4-5: Bilateral facet arthropathy with 2 mm of anterolisthesis. Bulging of the disc more prominent towards the right. Mild narrowing of the lateral recesses and of the intervertebral foramen on the right, but without visible neural compression. Findings at this level could relate to back pain or referred facet syndrome pain. 4. L5-S1: Previous left hemilaminectomy. Endplate osteophytes and bulging of the disc more prominent towards the  left. Facet degeneration and hypertrophy left more than right with left foraminal stenosis and subarticular lateral recess stenosis could cause left-sided neural compression.     LABS REVIEWED TODAY  12-02-20: wbc 6.4; hgb 11.3; hct 34.4; mcv 93.0 plt 216; glucose 209; bun 33; creat 1.13; k+ 4.3; na++ 137; ca 9.0; GFR 49; hgb a1c 8.6 12-03-20: wbc 6.5; hgb 9.8; hct 30.5 mcv 93.8 plt 182; glucose 188;bun 41; creat 1.49; k+ 4.4; na++ 134; ca 8.2; GFR 35 12-04-20; glucose 285; bun 29; creat 1.00; k+ 4.3; na++ 138; ca 8.7 GFR 56   Review of Systems  Constitutional: Negative for malaise/fatigue.  Respiratory: Negative for cough and shortness of breath.   Cardiovascular: Negative for chest pain, palpitations and leg swelling.  Gastrointestinal: Negative for abdominal pain, constipation and heartburn.  Musculoskeletal: Positive for joint pain. Negative for back pain and myalgias.       Left leg pain is managed   Skin: Negative.   Neurological: Negative for dizziness.  Psychiatric/Behavioral: The patient is not nervous/anxious.     Physical Exam Constitutional:       General: She is not in acute distress.    Appearance: She is well-developed. She is not diaphoretic.  Neck:     Thyroid: No thyromegaly.  Cardiovascular:     Rate and Rhythm: Normal rate and regular rhythm.     Pulses: Normal pulses.     Heart sounds: Normal heart sounds.  Pulmonary:     Effort: Pulmonary effort is normal. No respiratory distress.     Breath sounds: Normal breath sounds.  Abdominal:     General: Bowel sounds are normal. There is no distension.     Palpations: Abdomen is soft.     Tenderness: There is no abdominal tenderness.  Musculoskeletal:     Cervical back: Neck supple.     Right lower leg: No edema.     Left lower leg: No edema.     Comments: Left lower extremity in splint Is able to move all extremities   Lymphadenopathy:     Cervical: No cervical adenopathy.  Skin:    General: Skin is warm and dry.  Neurological:     Mental Status: She is alert and oriented to person, place, and time.  Psychiatric:        Mood and Affect: Mood normal.       ASSESSMENT/ PLAN:  TODAY  1. Closed fracture of proximal end of left fibula unspecified fracture morphology sequela: is stable will continue therapy as directed to improve upon her level of independence with her adls. Will follow up with orthopedics as indicated. Has oxycodone 5 mg every 6 hours as needed through 12-09-20.   2. Hyperlipidemia associated with type 2 diabetes mellitus: is stable will continue lipitor 20 mg daily   3. Type 2 diabetes mellitus with diabetic neuropathy with long term current use of insulin: is without change hgb a1c 8.6 will lantus 30 units nightly novolog 5 units with meals; metformin 500 mg twice daily  Is on statin ace  4. Hypertension associated with diabetes: is stable b/p 114/53 will continue lisinopril 10 mg daily   5. GERD without esophagitis: is stable will continue protonix 40 mg daily   6. Chronic constipation: is stable senna s 2 tabs nightly   7. Lumbar radicular  syndrome/avascular necrosis bilateral hip: is stable will continue gabapentin 300 mg twice daily tylenol 650 mg every 6 hours; robaxin 500 mg three times daily and will complete prednisone 20 mg  for total of 5 days.   8. Diabetic peripheral neuropathy: is stable will continue gabapentin 300 mg twice daily   Will check cbc; bmp 12-12-20.   Time spent with patient 45 minutes: >50% spent with counseling and coordination of care: regarding admission goals therapy goals and medications.   [name] NP [facility]  Contact [phone] Monday through Friday 8am- 5pm  After hours call [phone]

## 2020-12-09 ENCOUNTER — Encounter: Payer: Self-pay | Admitting: Adult Health

## 2020-12-09 ENCOUNTER — Non-Acute Institutional Stay (SKILLED_NURSING_FACILITY): Payer: Medicare HMO | Admitting: Adult Health

## 2020-12-09 DIAGNOSIS — S82409A Unspecified fracture of shaft of unspecified fibula, initial encounter for closed fracture: Secondary | ICD-10-CM | POA: Diagnosis not present

## 2020-12-09 NOTE — Progress Notes (Signed)
Location:  [place] Room Number: 159/P Place of Service:  SNF (31)   CODE STATUS: Full Code  Allergies  Allergen Reactions  . Codeine     Chief Complaint  Patient presents with  . Acute Visit    Pain Management    HPI:  She is presently on oxycodone 5 mg every 6 hours as needed for severe pain for her left fibula fracture. She has required this medication twice since being admitted to this facility. She states that she does not need this medication. She would like a pain medication.   Past Medical History:  Diagnosis Date  . Diabetes mellitus without complication ([place])   . High cholesterol   . Neck pain   . Neuropathy     Past Surgical History:  Procedure Laterality Date  . ABDOMINAL HYSTERECTOMY    . APPENDECTOMY    . BACK SURGERY    . CERVICAL DISC SURGERY    . HAND SURGERY    . KNEE SURGERY      Social History   Socioeconomic History  . Marital status: Widowed    Spouse name: Not on file  . Number of children: Not on file  . Years of education: Not on file  . Highest education level: Not on file  Occupational History  . Not on file  Tobacco Use  . Smoking status: Never Smoker  . Smokeless tobacco: Never Used  Vaping Use  . Vaping Use: Never used  Substance and Sexual Activity  . Alcohol use: Never  . Drug use: Never  . Sexual activity: Never  Other Topics Concern  . Not on file  Social History Narrative  . Not on file   Social Determinants of Health   Financial Resource Strain: Not on file  Food Insecurity: Not on file  Transportation Needs: Not on file  Physical Activity: Not on file  Stress: Not on file  Social Connections: Not on file  Intimate Partner Violence: Not on file   Family History  Problem Relation Age of Onset  . Cardiomyopathy Father   . Cancer Mother       VITAL SIGNS BP 136/70   Pulse 63   Temp 98.1 F (36.7 C)   Resp 20   Ht '5\' 2"'$  (1.575 m)   Wt 164 lb (74.4 kg)   SpO2 98%   BMI  30.00 kg/m   Outpatient Encounter Medications as of 12/09/2020  Medication Sig  . acetaminophen (TYLENOL) 325 MG tablet Take 2 tablets (650 mg total) by mouth every 6 (six) hours.  Marland Kitchen albuterol (VENTOLIN HFA) 108 (90 Base) MCG/ACT inhaler Inhale 1-2 puffs into the lungs every 4 (four) hours as needed for wheezing or shortness of breath.  Marland Kitchen atorvastatin (LIPITOR) 20 MG tablet Take 1 tablet (20 mg total) by mouth every evening.  . bisacodyl (DULCOLAX) 10 MG suppository Place 1 suppository (10 mg total) rectally daily as needed for moderate constipation.  . gabapentin (NEURONTIN) 300 MG capsule Take 1 capsule (300 mg total) by mouth 2 (two) times daily.  . insulin aspart (NOVOLOG) 100 UNIT/ML injection Inject 5 Units into the skin 3 (three) times daily before meals.  Marland Kitchen LANTUS SOLOSTAR 100 UNIT/ML Solostar Pen Inject 30 Units into the skin daily.  Marland Kitchen lisinopril (ZESTRIL) 10 MG tablet Take 1 tablet (10 mg total) by mouth daily.  . metFORMIN (GLUCOPHAGE) 500 MG tablet Take 500 mg by mouth 2 (two) times daily with a meal.  . methocarbamol (ROBAXIN) 500  MG tablet Take 1 tablet (500 mg total) by mouth 3 (three) times daily.  . Multiple Vitamin (MULTIVITAMIN WITH MINERALS) TABS tablet Take 1 tablet by mouth daily.  . NON FORMULARY Dist Consistent Carbohydrate  . omeprazole (PRILOSEC) 20 MG capsule Take 20 mg by mouth daily.  . predniSONE (DELTASONE) 20 MG tablet Take 1 tablet (20 mg total) by mouth daily with breakfast for 5 days.  Marland Kitchen senna-docusate (SENOKOT-S) 8.6-50 MG tablet Take 2 tablets by mouth at bedtime.  . [DISCONTINUED] oxyCODONE (OXY IR/ROXICODONE) 5 MG immediate release tablet Take 1 tablet (5 mg total) by mouth every 6 (six) hours as needed for moderate pain.  . [DISCONTINUED] pantoprazole (PROTONIX) 40 MG tablet Take 40 mg by mouth daily.   No facility-administered encounter medications on file as of 12/09/2020.     SIGNIFICANT DIAGNOSTIC EXAMS   PREVIOUS   12-02-20; ct of head: No  acute intracranial abnormality. Polypoid sinus disease.  12-02-20: ct of cervical spine:  Postoperative and degenerative changes in the cervical spine. No acute bony abnormality.  12-02-20; ct of pelvis:  1. No evidence of acute fracture or dislocation of the pelvis or hips. 2. 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 3. Degenerative changes in the hips with subcortical cysts on the left hip.  12-02-20: ct of left knee:  1. Subtle acute nondisplaced fracture through the medial aspect of the head of the fibula. 2. 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 3. There is a small joint effusion.  12-02-20: mri left hip:  1. No acute findings involving the left hip. 2. Mild chronic bilateral hip avascular necrosis. Moderate degenerative hip findings bilaterally. No hip effusion or regional bursitis. 3. Mild left greater than right hamstring tendinopathy. 4. 1.3 cm left eccentric Bartholin's cyst or Gartner cyst.  12-02-20: mri lumbar spine:  1. 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 2. L3-4: Bilateral posterolateral disc bulges, more prominent on the left. Mild left foraminal encroachment that could possibly affect the left L3 nerve. Moderate to severe bilateral facet osteoarthritis. These findings could relate to back pain or referred facet syndrome pain. 3. L4-5: Bilateral facet arthropathy with 2 mm of anterolisthesis. Bulging of the disc more prominent towards the right. Mild narrowing of the lateral recesses and of the intervertebral foramen on the right, but without visible neural compression. Findings at this level could relate to back pain or referred facet syndrome pain. 4. L5-S1: Previous left hemilaminectomy. Endplate osteophytes and bulging of the disc more prominent towards the left. Facet degeneration and hypertrophy left more than right with left foraminal stenosis and subarticular lateral recess  stenosis could cause left-sided neural compression.  NO NEW EXAMS.      LABS REVIEWED PREVIOUS   12-02-20: wbc 6.4; hgb 11.3; hct 34.4; mcv 93.0 plt 216; glucose 209; bun 33; creat 1.13; k+ 4.3; na++ 137; ca 9.0; GFR 49; hgb a1c 8.6 12-03-20: wbc 6.5; hgb 9.8; hct 30.5 mcv 93.8 plt 182; glucose 188;bun 41; creat 1.49; k+ 4.4; na++ 134; ca 8.2; GFR 35 12-04-20; glucose 285; bun 29; creat 1.00; k+ 4.3; na++ 138; ca 8.7 GFR 56   NO NEW LABS.   Review of Systems  Constitutional: Negative for malaise/fatigue.  Respiratory: Negative for cough and shortness of breath.   Cardiovascular: Negative for chest pain, palpitations and leg swelling.  Gastrointestinal: Negative for abdominal pain, constipation and heartburn.  Musculoskeletal: Negative for back pain, joint pain and myalgias.  Skin: Negative.   Neurological: Negative for  dizziness.  Psychiatric/Behavioral: The patient is not nervous/anxious.     Physical Exam Constitutional:      General: She is not in acute distress.    Appearance: She is well-developed. She is not diaphoretic.  Neck:     Thyroid: No thyromegaly.  Cardiovascular:     Rate and Rhythm: Normal rate and regular rhythm.     Pulses: Normal pulses.     Heart sounds: Normal heart sounds.  Pulmonary:     Effort: Pulmonary effort is normal. No respiratory distress.     Breath sounds: Normal breath sounds.  Abdominal:     General: Bowel sounds are normal. There is no distension.     Palpations: Abdomen is soft.     Tenderness: There is no abdominal tenderness.  Musculoskeletal:     Cervical back: Neck supple.     Right lower leg: No edema.     Left lower leg: No edema.     Comments: Left lower extremity in splint Is able to move all extremities    Lymphadenopathy:     Cervical: No cervical adenopathy.  Skin:    General: Skin is warm and dry.  Neurological:     Mental Status: She is alert and oriented to person, place, and time.  Psychiatric:        Mood and  Affect: Mood normal.       ASSESSMENT/ PLAN:  TODAY  1. Fracture of fibula alone  Will stop oxycodone Will begin tylenol cr 650 mg every 6 hours    [name] NP [facility]  Contact [phone] Monday through Friday 8am- 5pm  After hours call [phone]

## 2020-12-11 ENCOUNTER — Non-Acute Institutional Stay (SKILLED_NURSING_FACILITY): Payer: Medicare HMO | Admitting: Internal Medicine

## 2020-12-11 ENCOUNTER — Other Ambulatory Visit (HOSPITAL_COMMUNITY)
Admission: RE | Admit: 2020-12-11 | Discharge: 2020-12-11 | Disposition: A | Discharge status: Home / Self Care | Payer: Medicare HMO | Source: Skilled Nursing Facility | Attending: Adult Health | Admitting: Adult Health

## 2020-12-11 ENCOUNTER — Encounter: Payer: Self-pay | Admitting: Internal Medicine

## 2020-12-11 DIAGNOSIS — Z794 Long term (current) use of insulin: Secondary | ICD-10-CM

## 2020-12-11 DIAGNOSIS — S82409A Unspecified fracture of shaft of unspecified fibula, initial encounter for closed fracture: Secondary | ICD-10-CM | POA: Diagnosis not present

## 2020-12-11 DIAGNOSIS — G629 Polyneuropathy, unspecified: Secondary | ICD-10-CM | POA: Diagnosis not present

## 2020-12-11 DIAGNOSIS — I152 Hypertension secondary to endocrine disorders: Secondary | ICD-10-CM | POA: Diagnosis not present

## 2020-12-11 DIAGNOSIS — N183 Chronic kidney disease, stage 3 unspecified: Secondary | ICD-10-CM | POA: Insufficient documentation

## 2020-12-11 DIAGNOSIS — E1140 Type 2 diabetes mellitus with diabetic neuropathy, unspecified: Secondary | ICD-10-CM

## 2020-12-11 DIAGNOSIS — E1159 Type 2 diabetes mellitus with other circulatory complications: Secondary | ICD-10-CM

## 2020-12-11 DIAGNOSIS — Z9181 History of falling: Secondary | ICD-10-CM | POA: Insufficient documentation

## 2020-12-11 DIAGNOSIS — N1832 Chronic kidney disease, stage 3b: Secondary | ICD-10-CM

## 2020-12-11 LAB — CBC
HCT: 41 % (ref 36.0–46.0)
Hemoglobin: 13.3 g/dL (ref 12.0–15.0)
MCH: 30.2 pg (ref 26.0–34.0)
MCHC: 32.4 g/dL (ref 30.0–36.0)
MCV: 93.2 fL (ref 80.0–100.0)
Platelets: 247 10*3/uL (ref 150–400)
RBC: 4.4 MIL/uL (ref 3.87–5.11)
RDW: 13.7 % (ref 11.5–15.5)
WBC: 9.5 10*3/uL (ref 4.0–10.5)
nRBC: 0 % (ref 0.0–0.2)

## 2020-12-11 LAB — BASIC METABOLIC PANEL
Anion gap: 12 (ref 5–15)
BUN: 44 mg/dL — ABNORMAL HIGH (ref 8–23)
CO2: 23 mmol/L (ref 22–32)
Calcium: 8.8 mg/dL — ABNORMAL LOW (ref 8.9–10.3)
Chloride: 102 mmol/L (ref 98–111)
Creatinine, Ser: 1.29 mg/dL — ABNORMAL HIGH (ref 0.44–1.00)
GFR, Estimated: 41 mL/min — ABNORMAL LOW (ref >60)
Glucose, Bld: 97 mg/dL (ref 70–99)
Potassium: 4.3 mmol/L (ref 3.5–5.1)
Sodium: 137 mmol/L (ref 135–145)

## 2020-12-11 NOTE — Assessment & Plan Note (Addendum)
Diabetes is poorly controlled as evidenced by an A1c of 8.6%.  Nutrition consult will be completed at the SNF.  Sliding scale is not appropriate in the SNF setting but basal insulin & ac insulin will be adjusted as indicated.  She has CKD stage IIIb so Metformin has limited dosage option.

## 2020-12-11 NOTE — Progress Notes (Signed)
NURSING HOME LOCATION:  [facility]  ROOM NUMBER:  159  CODE STATUS:    PCP:  Judd Lien MD  This is a comprehensive admission note to this SNF performed on this date less than 30 days from date of admission. Included are preadmission medical/surgical history; reconciled medication list; family history; social history and comprehensive review of systems.  Corrections and additions to the records were documented. Comprehensive physical exam was also performed. Additionally a clinical summary was entered for each active diagnosis pertinent to this admission in the Problem List to enhance continuity of care.  HPI: Patient was hospitalized 4/3-12/04/2020 with closed fracture of the proximal end of the left fibula (medial aspect) sustained in a fall.  Apparently this was a mechanical fall without neurologic or cardiologic prodrome.  She did hit her head but there was no loss of consciousness.  She did fall on the left side and had persistent left hip, left knee,& left ankle pain.  Initially she was seen in urgent care facility and ankle fracture suspect, prompting placement in a fracture boot. In the ED CT of the cervical spine and brain revealed no acute changes.  CT of the pelvis showed degenerative changes in the hips and avascular necrosis of the hips.  Scan of the left knee without contrast suggested subtle acute nondisplaced fracture through the medial aspect of the head of the fibula.  Also tricompartmental osteoarthritis was present.  Orthopedic consult recommended knee immobilization and pain medication regimen.  Glucose was 219 ; A1 was 8.6% indicating poorly controlled DM. Basal & SSI were initiated. BUN was 33, creatinine 1.13 and GFR 49.  Mild anemia was present with hemoglobin 11.3.  Past medical and surgical history: Includes DDD,dyslipidemia, and diabetes with neuropathy. Surgeries and procedures include abdominal hysterectomy, L4-S1 left hemilaminectomy, hand  surgery, and cervical disc surgery.  Social history: Nondrinker; never smoked.  Family history: Noncontributory due to advanced age.   Review of systems: Her chief complaint is "poor circulation in my hands" which she states caused decreased sensation & causes her to drop things.  She states that she has tried gabapentin duloxetine without benefit. She has residual soreness in the left popliteal space.  She confirms that there was no neurologic or cardiologic prodrome prior to the fall.  She states that she was reaching to take off her sock when she fell. Today she had loose, slightly watery stool.  This is an isolated phenomena.  She denies any stool or urinary incontinence. She states she has been told she snores but there is no history of apnea.  Constitutional: No fever, significant weight change, fatigue  Eyes: No redness, discharge, pain, vision change ENT/mouth: No nasal congestion, purulent discharge, earache, change in hearing, sore throat  Cardiovascular: No chest pain, palpitations, paroxysmal nocturnal dyspnea, claudication, edema  Respiratory: No cough, sputum production, hemoptysis, DOE, significant snoring, apnea Gastrointestinal: No heartburn, dysphagia, abdominal pain, nausea /vomiting, rectal bleeding, melena Genitourinary: No dysuria, hematuria, pyuria, incontinence, nocturia Dermatologic: No rash, pruritus, change in appearance of skin Neurologic: No dizziness, headache, syncope, seizures Psychiatric: No significant anxiety, depression, insomnia, anorexia Endocrine: No change in hair/skin/nails, excessive thirst, excessive hunger, excessive urination  Hematologic/lymphatic: No significant bruising, lymphadenopathy, abnormal bleeding Allergy/immunology: No itchy/watery eyes, significant sneezing, urticaria, angioedema  Physical exam:  Pertinent or positive findings: She appears younger than her stated age of 55.  She has complete dentures.  First heart sound accentuated.   Pedal pulses are decreased. Radial artery pulses are strong.  General appearance: Adequately nourished; no acute distress, increased work of breathing is present.   Lymphatic: No lymphadenopathy about the head, neck, axilla. Eyes: No conjunctival inflammation or lid edema is present. There is no scleral icterus. Ears:  External ear exam shows no significant lesions or deformities.   Nose:  External nasal examination shows no deformity or inflammation. Nasal mucosa are pink and moist without lesions, exudates Oral exam: Lips and gums are healthy appearing.There is no oropharyngeal erythema or exudate. Neck:  No thyromegaly, masses, tenderness noted.    Heart:  Normal rate and regular rhythm.  S2 normal without gallop, murmur, click, rub.  Lungs: Chest clear to auscultation without wheezes, rhonchi, rales, rubs. Abdomen: Bowel sounds are normal.  Abdomen is soft and nontender with no organomegaly, hernias, masses. GU: Deferred  Extremities:  No cyanosis, clubbing, edema. Neurologic exam: Balance, Rhomberg, finger to nose testing could not be completed due to clinical state Skin: Warm & dry w/o tenting. No significant lesions or rash.  See clinical summary under each active problem in the Problem List with associated updated therapeutic plan

## 2020-12-11 NOTE — Assessment & Plan Note (Signed)
PT/OT at SNF as tolerated. ?

## 2020-12-11 NOTE — Assessment & Plan Note (Signed)
She describes decreased sensation in her hands causing her to drop things.  She states that she has had no benefit with gabapentin or duloxetine.  She is convinced that she has "poor circulation".  She has strong radial artery pulses and no signs of ischemia in the hands.

## 2020-12-11 NOTE — Assessment & Plan Note (Signed)
Today systolic blood pressure is actually slightly low with a value of 108.  Antihypertensive regimen will be adjusted if this is progressive.

## 2020-12-11 NOTE — Assessment & Plan Note (Signed)
ACE inhibitor and Metformin dosages may need to be adjusted as she has CKD stage IIIb with creatinine of 1.29 and GFR 41.

## 2020-12-11 NOTE — Patient Instructions (Signed)
See assessment and plan under each diagnosis in the problem list and acutely for this visit 

## 2020-12-12 ENCOUNTER — Other Ambulatory Visit (HOSPITAL_COMMUNITY)
Admission: RE | Admit: 2020-12-12 | Discharge: 2020-12-12 | Disposition: A | Discharge status: Home / Self Care | Payer: Medicare HMO | Source: Skilled Nursing Facility | Attending: Adult Health | Admitting: Adult Health

## 2020-12-12 DIAGNOSIS — E1140 Type 2 diabetes mellitus with diabetic neuropathy, unspecified: Secondary | ICD-10-CM | POA: Diagnosis not present

## 2020-12-12 LAB — BASIC METABOLIC PANEL
Anion gap: 11 (ref 5–15)
BUN: 45 mg/dL — ABNORMAL HIGH (ref 8–23)
CO2: 21 mmol/L — ABNORMAL LOW (ref 22–32)
Calcium: 8.9 mg/dL (ref 8.9–10.3)
Chloride: 105 mmol/L (ref 98–111)
Creatinine, Ser: 1.21 mg/dL — ABNORMAL HIGH (ref 0.44–1.00)
GFR, Estimated: 45 mL/min — ABNORMAL LOW (ref >60)
Glucose, Bld: 114 mg/dL — ABNORMAL HIGH (ref 70–99)
Potassium: 4.4 mmol/L (ref 3.5–5.1)
Sodium: 137 mmol/L (ref 135–145)

## 2020-12-12 LAB — CBC
HCT: 35.3 % — ABNORMAL LOW (ref 36.0–46.0)
Hemoglobin: 11.6 g/dL — ABNORMAL LOW (ref 12.0–15.0)
MCH: 30.5 pg (ref 26.0–34.0)
MCHC: 32.9 g/dL (ref 30.0–36.0)
MCV: 92.9 fL (ref 80.0–100.0)
Platelets: 234 10*3/uL (ref 150–400)
RBC: 3.8 MIL/uL — ABNORMAL LOW (ref 3.87–5.11)
RDW: 13.9 % (ref 11.5–15.5)
WBC: 6.5 10*3/uL (ref 4.0–10.5)
nRBC: 0 % (ref 0.0–0.2)

## 2020-12-19 DIAGNOSIS — S82832D Other fracture of upper and lower end of left fibula, subsequent encounter for closed fracture with routine healing: Secondary | ICD-10-CM | POA: Insufficient documentation

## 2020-12-20 ENCOUNTER — Non-Acute Institutional Stay (SKILLED_NURSING_FACILITY): Payer: Medicare HMO | Admitting: Adult Health

## 2020-12-20 ENCOUNTER — Other Ambulatory Visit: Payer: Self-pay | Admitting: Adult Health

## 2020-12-20 ENCOUNTER — Encounter: Payer: Self-pay | Admitting: Adult Health

## 2020-12-20 DIAGNOSIS — E1142 Type 2 diabetes mellitus with diabetic polyneuropathy: Secondary | ICD-10-CM

## 2020-12-20 DIAGNOSIS — M87052 Idiopathic aseptic necrosis of left femur: Secondary | ICD-10-CM

## 2020-12-20 DIAGNOSIS — S82409A Unspecified fracture of shaft of unspecified fibula, initial encounter for closed fracture: Secondary | ICD-10-CM | POA: Diagnosis not present

## 2020-12-20 DIAGNOSIS — M87051 Idiopathic aseptic necrosis of right femur: Secondary | ICD-10-CM | POA: Diagnosis not present

## 2020-12-20 DIAGNOSIS — N1832 Chronic kidney disease, stage 3b: Secondary | ICD-10-CM

## 2020-12-20 DIAGNOSIS — I152 Hypertension secondary to endocrine disorders: Secondary | ICD-10-CM

## 2020-12-20 DIAGNOSIS — E1159 Type 2 diabetes mellitus with other circulatory complications: Secondary | ICD-10-CM

## 2020-12-20 MED ORDER — ALBUTEROL SULFATE HFA 108 (90 BASE) MCG/ACT IN AERS: 1.0000 | INHALATION_SPRAY | RESPIRATORY_TRACT | 0 refills | Status: DC | PRN

## 2020-12-20 MED ORDER — METHOCARBAMOL 500 MG PO TABS: 500.0000 mg | ORAL_TABLET | Freq: Three times a day (TID) | ORAL | 0 refills | Status: DC

## 2020-12-20 MED ORDER — ATORVASTATIN CALCIUM 20 MG PO TABS: 20.0000 mg | ORAL_TABLET | Freq: Every evening | ORAL | 0 refills | Status: DC

## 2020-12-20 MED ORDER — LANTUS SOLOSTAR 100 UNIT/ML ~~LOC~~ SOPN: 30.0000 [IU] | PEN_INJECTOR | Freq: Every day | SUBCUTANEOUS | 0 refills | Status: DC

## 2020-12-20 MED ORDER — INSULIN ASPART 100 UNIT/ML ~~LOC~~ SOLN: 5.0000 [IU] | Freq: Three times a day (TID) | SUBCUTANEOUS | 0 refills | Status: DC

## 2020-12-20 MED ORDER — METFORMIN HCL 500 MG PO TABS: 500.0000 mg | ORAL_TABLET | Freq: Two times a day (BID) | ORAL | 0 refills | Status: DC

## 2020-12-20 MED ORDER — GABAPENTIN 300 MG PO CAPS: 300.0000 mg | ORAL_CAPSULE | Freq: Two times a day (BID) | ORAL | 0 refills | Status: DC

## 2020-12-20 MED ORDER — LISINOPRIL 10 MG PO TABS: 10.0000 mg | ORAL_TABLET | Freq: Every day | ORAL | 0 refills | Status: DC

## 2020-12-20 NOTE — Progress Notes (Signed)
Location:    [place] Room Number: 159/P Place of Service:  SNF (31)    CODE STATUS: Full Code  Allergies  Allergen Reactions  . Codeine     Chief Complaint  Patient presents with  . Discharge Note    Discharge Visit     HPI:  She is being discharged to home with home health for pt/ot. She will need to have her prescriptions written. She will not need dme. Will need to follow up with her medical provider. She was admitted to this facility after being hospitalized for closed fracture proximal end of left fibula. She was admitted to this facility for short term rehab. She has participated in pt and ot to improve upon her level of independence with her adls. She has progressed to the point of going home to complete her therapy.    Past Medical History:  Diagnosis Date  . Diabetes mellitus without complication ([place])   . High cholesterol   . Neck pain   . Neuropathy     Past Surgical History:  Procedure Laterality Date  . ABDOMINAL HYSTERECTOMY    . APPENDECTOMY    . BACK SURGERY    . CERVICAL DISC SURGERY    . HAND SURGERY    . KNEE SURGERY      Social History   Socioeconomic History  . Marital status: Widowed    Spouse name: Not on file  . Number of children: Not on file  . Years of education: Not on file  . Highest education level: Not on file  Occupational History  . Not on file  Tobacco Use  . Smoking status: Never Smoker  . Smokeless tobacco: Never Used  Vaping Use  . Vaping Use: Never used  Substance and Sexual Activity  . Alcohol use: Never  . Drug use: Never  . Sexual activity: Never  Other Topics Concern  . Not on file  Social History Narrative  . Not on file   Social Determinants of Health   Financial Resource Strain: Not on file  Food Insecurity: Not on file  Transportation Needs: Not on file  Physical Activity: Not on file  Stress: Not on file  Social Connections: Not on file  Intimate Partner Violence: Not  on file   Family History  Problem Relation Age of Onset  . Cardiomyopathy Father   . Cancer Mother     VITAL SIGNS BP (!) 144/68   Pulse 72   Temp 97.8 F (36.6 C)   Resp 20   Ht '5\' 2"'$  (1.575 m)   Wt 164 lb (74.4 kg)   SpO2 96%   BMI 30.00 kg/m   Patient's Medications  New Prescriptions   No medications on file  Previous Medications   ACETAMINOPHEN (TYLENOL) 325 MG TABLET    Take 2 tablets (650 mg total) by mouth every 6 (six) hours.   ALBUTEROL (VENTOLIN HFA) 108 (90 BASE) MCG/ACT INHALER    Inhale 1 puff into the lungs every 4 (four) hours as needed for wheezing or shortness of breath.   ATORVASTATIN (LIPITOR) 20 MG TABLET    Take 1 tablet (20 mg total) by mouth every evening.   BISACODYL (DULCOLAX) 10 MG SUPPOSITORY    Place 1 suppository (10 mg total) rectally daily as needed for moderate constipation.   GABAPENTIN (NEURONTIN) 300 MG CAPSULE    Take 1 capsule (300 mg total) by mouth 2 (two) times daily.   INSULIN ASPART (NOVOLOG) 100 UNIT/ML INJECTION  Inject 5 Units into the skin 3 (three) times daily before meals.   LANTUS SOLOSTAR 100 UNIT/ML SOLOSTAR PEN    Inject 30 Units into the skin daily.   LISINOPRIL (ZESTRIL) 10 MG TABLET    Take 1 tablet (10 mg total) by mouth daily.   METFORMIN (GLUCOPHAGE) 500 MG TABLET    Take 1 tablet (500 mg total) by mouth 2 (two) times daily with a meal.   METHOCARBAMOL (ROBAXIN) 500 MG TABLET    Take 1 tablet (500 mg total) by mouth 3 (three) times daily.   NON FORMULARY    Dist Consistent Carbohydrate   OMEPRAZOLE (PRILOSEC) 20 MG CAPSULE    Take 20 mg by mouth daily.   SENNA-DOCUSATE (SENOKOT-S) 8.6-50 MG TABLET    Take 2 tablets by mouth at bedtime.  Modified Medications   No medications on file  Discontinued Medications   ALBUTEROL (VENTOLIN HFA) 108 (90 BASE) MCG/ACT INHALER    Inhale 1-2 puffs into the lungs every 4 (four) hours as needed for wheezing or shortness of breath.   MULTIPLE VITAMIN (MULTIVITAMIN WITH MINERALS) TABS  TABLET    Take 1 tablet by mouth daily.     SIGNIFICANT DIAGNOSTIC EXAMS   PREVIOUS   12-02-20; ct of head: No acute intracranial abnormality. Polypoid sinus disease.  12-02-20: ct of cervical spine:  Postoperative and degenerative changes in the cervical spine. No acute bony abnormality.  12-02-20; ct of pelvis:  1. No evidence of acute fracture or dislocation of the pelvis or hips. 2. 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 3. Degenerative changes in the hips with subcortical cysts on the left hip.  12-02-20: ct of left knee:  1. Subtle acute nondisplaced fracture through the medial aspect of the head of the fibula. 2. 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 3. There is a small joint effusion.  12-02-20: mri left hip:  1. No acute findings involving the left hip. 2. Mild chronic bilateral hip avascular necrosis. Moderate degenerative hip findings bilaterally. No hip effusion or regional bursitis. 3. Mild left greater than right hamstring tendinopathy. 4. 1.3 cm left eccentric Bartholin's cyst or Gartner cyst.  12-02-20: mri lumbar spine:  1. 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 2. L3-4: Bilateral posterolateral disc bulges, more prominent on the left. Mild left foraminal encroachment that could possibly affect the left L3 nerve. Moderate to severe bilateral facet osteoarthritis. These findings could relate to back pain or referred facet syndrome pain. 3. L4-5: Bilateral facet arthropathy with 2 mm of anterolisthesis. Bulging of the disc more prominent towards the right. Mild narrowing of the lateral recesses and of the intervertebral foramen on the right, but without visible neural compression. Findings at this level could relate to back pain or referred facet syndrome pain. 4. L5-S1: Previous left hemilaminectomy. Endplate osteophytes and bulging of the disc more prominent towards the left. Facet  degeneration and hypertrophy left more than right with left foraminal stenosis and subarticular lateral recess stenosis could cause left-sided neural compression.  NO NEW EXAMS.      LABS REVIEWED PREVIOUS   12-02-20: wbc 6.4; hgb 11.3; hct 34.4; mcv 93.0 plt 216; glucose 209; bun 33; creat 1.13; k+ 4.3; na++ 137; ca 9.0; GFR 49; hgb a1c 8.6 12-03-20: wbc 6.5; hgb 9.8; hct 30.5 mcv 93.8 plt 182; glucose 188;bun 41; creat 1.49; k+ 4.4; na++ 134; ca 8.2; GFR 35 12-04-20; glucose 285; bun 29; creat 1.00; k+ 4.3; na++ 138; ca 8.7 GFR  [place].   Review of Systems  Constitutional: Negative for malaise/fatigue.  Respiratory: Negative for cough and shortness of breath.   Cardiovascular: Negative for chest pain, palpitations and leg swelling.  Gastrointestinal: Negative for abdominal pain, constipation and heartburn.  Musculoskeletal: Negative for back pain, joint pain and myalgias.  Skin: Negative.   Neurological: Negative for dizziness.  Psychiatric/Behavioral: The patient is not nervous/anxious.     Physical Exam Constitutional:      General: She is not in acute distress.    Appearance: She is well-developed. She is not diaphoretic.  Neck:     Thyroid: No thyromegaly.  Cardiovascular:     Rate and Rhythm: Normal rate and regular rhythm.     Pulses: Normal pulses.     Heart sounds: Normal heart sounds.  Pulmonary:     Effort: Pulmonary effort is normal. No respiratory distress.     Breath sounds: Normal breath sounds.  Abdominal:     General: Bowel sounds are normal. There is no distension.     Palpations: Abdomen is soft.     Tenderness: There is no abdominal tenderness.  Musculoskeletal:     Cervical back: Neck supple.     Right lower leg: No edema.     Left lower leg: No edema.     Comments: Left lower extremity in splint Is able to move all extremities     Lymphadenopathy:     Cervical: No cervical adenopathy.  Skin:    General: Skin is warm and dry.  Neurological:      Mental Status: She is alert and oriented to person, place, and time.  Psychiatric:        Mood and Affect: Mood normal.      ASSESSMENT/ PLAN:  Patient is being discharged with the following home health services:  Pt/ot to evaluate and treat as indicated for gait balance strength adl training.   Patient is being discharged with the following durable medical equipment:  None needed   Patient has been advised to f/u with their PCP in 1-2 weeks to bring them up to date on their rehab stay.  Social services at facility was responsible for arranging this appointment.  Pt was provided with a 30 day supply of prescriptions for medications and refills must be obtained from their PCP.  For controlled substances, a more limited supply may be provided adequate until PCP appointment only.  A 30 day supply of her prescription medications have been sent to [facility] [address]  Time spent with patient: 35 minutes: dme; medications and home health needs.    [name] NP [facility]  Contact [phone] Monday through Friday 8am- 5pm  After hours call [phone]

## 2020-12-22 DIAGNOSIS — E1142 Type 2 diabetes mellitus with diabetic polyneuropathy: Secondary | ICD-10-CM | POA: Diagnosis not present

## 2020-12-22 DIAGNOSIS — M47812 Spondylosis without myelopathy or radiculopathy, cervical region: Secondary | ICD-10-CM | POA: Diagnosis not present

## 2020-12-22 DIAGNOSIS — M5031 Other cervical disc degeneration,  high cervical region: Secondary | ICD-10-CM | POA: Diagnosis not present

## 2020-12-22 DIAGNOSIS — M2578 Osteophyte, vertebrae: Secondary | ICD-10-CM | POA: Diagnosis not present

## 2020-12-22 DIAGNOSIS — M4726 Other spondylosis with radiculopathy, lumbar region: Secondary | ICD-10-CM | POA: Diagnosis not present

## 2020-12-22 DIAGNOSIS — G8929 Other chronic pain: Secondary | ICD-10-CM | POA: Diagnosis not present

## 2020-12-22 DIAGNOSIS — S82832D Other fracture of upper and lower end of left fibula, subsequent encounter for closed fracture with routine healing: Secondary | ICD-10-CM | POA: Diagnosis not present

## 2020-12-22 DIAGNOSIS — M170 Bilateral primary osteoarthritis of knee: Secondary | ICD-10-CM | POA: Diagnosis not present

## 2020-12-22 DIAGNOSIS — M160 Bilateral primary osteoarthritis of hip: Secondary | ICD-10-CM | POA: Diagnosis not present

## 2020-12-23 ENCOUNTER — Ambulatory Visit (INDEPENDENT_AMBULATORY_CARE_PROVIDER_SITE_OTHER): Payer: Medicare HMO | Admitting: Orthopedic Surgery

## 2020-12-23 ENCOUNTER — Inpatient Hospital Stay: Payer: Medicare HMO

## 2020-12-23 ENCOUNTER — Other Ambulatory Visit: Payer: Self-pay

## 2020-12-23 DIAGNOSIS — S82832D Other fracture of upper and lower end of left fibula, subsequent encounter for closed fracture with routine healing: Secondary | ICD-10-CM

## 2020-12-23 NOTE — Progress Notes (Signed)
Chief Complaint  Patient presents with  . Leg Injury    Left proximal fibula fracture 12/02/20    85 yo female admitted to [facility] with severe left leg pain and a prox fib frcat. In the hospital she was treated for acute left leg radiculitis   She comes in for f/u walking with a walker receiving PT c/o left leg weakness   She has a knee imoobilizer on    The left knee exam is remarkable for tender fib neck with mild pain posteriorly on knee extension   xrays today show prox fib fratcure ND    rec   PT   Hinged knee brace   Fu 6 weeks

## 2020-12-26 ENCOUNTER — Telehealth: Payer: Self-pay | Admitting: Orthopedic Surgery

## 2020-12-26 DIAGNOSIS — M2578 Osteophyte, vertebrae: Secondary | ICD-10-CM | POA: Diagnosis not present

## 2020-12-26 DIAGNOSIS — M47812 Spondylosis without myelopathy or radiculopathy, cervical region: Secondary | ICD-10-CM | POA: Diagnosis not present

## 2020-12-26 DIAGNOSIS — S82832D Other fracture of upper and lower end of left fibula, subsequent encounter for closed fracture with routine healing: Secondary | ICD-10-CM | POA: Diagnosis not present

## 2020-12-26 DIAGNOSIS — E1142 Type 2 diabetes mellitus with diabetic polyneuropathy: Secondary | ICD-10-CM | POA: Diagnosis not present

## 2020-12-26 DIAGNOSIS — M160 Bilateral primary osteoarthritis of hip: Secondary | ICD-10-CM | POA: Diagnosis not present

## 2020-12-26 DIAGNOSIS — M5031 Other cervical disc degeneration,  high cervical region: Secondary | ICD-10-CM | POA: Diagnosis not present

## 2020-12-26 DIAGNOSIS — G8929 Other chronic pain: Secondary | ICD-10-CM | POA: Diagnosis not present

## 2020-12-26 DIAGNOSIS — M170 Bilateral primary osteoarthritis of knee: Secondary | ICD-10-CM | POA: Diagnosis not present

## 2020-12-26 DIAGNOSIS — M4726 Other spondylosis with radiculopathy, lumbar region: Secondary | ICD-10-CM | POA: Diagnosis not present

## 2020-12-26 NOTE — Telephone Encounter (Signed)
Yes, she can come back in to exchange I called line busy

## 2020-12-26 NOTE — Telephone Encounter (Signed)
Line still busy.

## 2020-12-26 NOTE — Telephone Encounter (Signed)
Call received from designated contact, patient's granddaughter, Jodelle Green [phone], relaying that the brace received yesterday, 12/25/20, is too small. States that the home physical therapist thought so as well, and recommended for her to call back to find out if the brace may be available in size medium? Please advise.

## 2020-12-27 DIAGNOSIS — M160 Bilateral primary osteoarthritis of hip: Secondary | ICD-10-CM | POA: Diagnosis not present

## 2020-12-27 DIAGNOSIS — M2578 Osteophyte, vertebrae: Secondary | ICD-10-CM | POA: Diagnosis not present

## 2020-12-27 DIAGNOSIS — M4726 Other spondylosis with radiculopathy, lumbar region: Secondary | ICD-10-CM | POA: Diagnosis not present

## 2020-12-27 DIAGNOSIS — M47812 Spondylosis without myelopathy or radiculopathy, cervical region: Secondary | ICD-10-CM | POA: Diagnosis not present

## 2020-12-27 DIAGNOSIS — E1142 Type 2 diabetes mellitus with diabetic polyneuropathy: Secondary | ICD-10-CM | POA: Diagnosis not present

## 2020-12-27 DIAGNOSIS — M170 Bilateral primary osteoarthritis of knee: Secondary | ICD-10-CM | POA: Diagnosis not present

## 2020-12-27 DIAGNOSIS — G8929 Other chronic pain: Secondary | ICD-10-CM | POA: Diagnosis not present

## 2020-12-27 DIAGNOSIS — S82832D Other fracture of upper and lower end of left fibula, subsequent encounter for closed fracture with routine healing: Secondary | ICD-10-CM | POA: Diagnosis not present

## 2020-12-27 DIAGNOSIS — M5031 Other cervical disc degeneration,  high cervical region: Secondary | ICD-10-CM | POA: Diagnosis not present

## 2020-12-30 DIAGNOSIS — E1142 Type 2 diabetes mellitus with diabetic polyneuropathy: Secondary | ICD-10-CM | POA: Diagnosis not present

## 2020-12-30 DIAGNOSIS — M160 Bilateral primary osteoarthritis of hip: Secondary | ICD-10-CM | POA: Diagnosis not present

## 2020-12-30 DIAGNOSIS — M5031 Other cervical disc degeneration,  high cervical region: Secondary | ICD-10-CM | POA: Diagnosis not present

## 2020-12-30 DIAGNOSIS — M170 Bilateral primary osteoarthritis of knee: Secondary | ICD-10-CM | POA: Diagnosis not present

## 2020-12-30 DIAGNOSIS — M4726 Other spondylosis with radiculopathy, lumbar region: Secondary | ICD-10-CM | POA: Diagnosis not present

## 2020-12-30 DIAGNOSIS — M47812 Spondylosis without myelopathy or radiculopathy, cervical region: Secondary | ICD-10-CM | POA: Diagnosis not present

## 2020-12-30 DIAGNOSIS — M2578 Osteophyte, vertebrae: Secondary | ICD-10-CM | POA: Diagnosis not present

## 2020-12-30 DIAGNOSIS — S82832D Other fracture of upper and lower end of left fibula, subsequent encounter for closed fracture with routine healing: Secondary | ICD-10-CM | POA: Diagnosis not present

## 2020-12-30 DIAGNOSIS — G8929 Other chronic pain: Secondary | ICD-10-CM | POA: Diagnosis not present

## 2021-01-02 DIAGNOSIS — G8929 Other chronic pain: Secondary | ICD-10-CM | POA: Diagnosis not present

## 2021-01-02 DIAGNOSIS — M5031 Other cervical disc degeneration,  high cervical region: Secondary | ICD-10-CM | POA: Diagnosis not present

## 2021-01-02 DIAGNOSIS — M47812 Spondylosis without myelopathy or radiculopathy, cervical region: Secondary | ICD-10-CM | POA: Diagnosis not present

## 2021-01-02 DIAGNOSIS — M170 Bilateral primary osteoarthritis of knee: Secondary | ICD-10-CM | POA: Diagnosis not present

## 2021-01-02 DIAGNOSIS — S82832D Other fracture of upper and lower end of left fibula, subsequent encounter for closed fracture with routine healing: Secondary | ICD-10-CM | POA: Diagnosis not present

## 2021-01-02 DIAGNOSIS — M160 Bilateral primary osteoarthritis of hip: Secondary | ICD-10-CM | POA: Diagnosis not present

## 2021-01-02 DIAGNOSIS — M2578 Osteophyte, vertebrae: Secondary | ICD-10-CM | POA: Diagnosis not present

## 2021-01-02 DIAGNOSIS — M4726 Other spondylosis with radiculopathy, lumbar region: Secondary | ICD-10-CM | POA: Diagnosis not present

## 2021-01-02 DIAGNOSIS — E1142 Type 2 diabetes mellitus with diabetic polyneuropathy: Secondary | ICD-10-CM | POA: Diagnosis not present

## 2021-01-03 NOTE — Telephone Encounter (Signed)
Patient came in today and she exchanged the small for medium and was happy with the exchange. I Penni Bombard to make sure our stock was replaced. I apologized for the brace not fitting.

## 2021-01-06 DIAGNOSIS — E782 Mixed hyperlipidemia: Secondary | ICD-10-CM | POA: Diagnosis not present

## 2021-01-06 DIAGNOSIS — E1165 Type 2 diabetes mellitus with hyperglycemia: Secondary | ICD-10-CM | POA: Diagnosis not present

## 2021-01-06 DIAGNOSIS — E1140 Type 2 diabetes mellitus with diabetic neuropathy, unspecified: Secondary | ICD-10-CM | POA: Diagnosis not present

## 2021-01-06 DIAGNOSIS — E7849 Other hyperlipidemia: Secondary | ICD-10-CM | POA: Diagnosis not present

## 2021-01-06 DIAGNOSIS — N182 Chronic kidney disease, stage 2 (mild): Secondary | ICD-10-CM | POA: Diagnosis not present

## 2021-01-06 DIAGNOSIS — S82402A Unspecified fracture of shaft of left fibula, initial encounter for closed fracture: Secondary | ICD-10-CM | POA: Diagnosis not present

## 2021-01-06 DIAGNOSIS — R5383 Other fatigue: Secondary | ICD-10-CM | POA: Diagnosis not present

## 2021-01-06 DIAGNOSIS — I10 Essential (primary) hypertension: Secondary | ICD-10-CM | POA: Diagnosis not present

## 2021-01-06 DIAGNOSIS — Z6830 Body mass index (BMI) 30.0-30.9, adult: Secondary | ICD-10-CM | POA: Diagnosis not present

## 2021-01-06 DIAGNOSIS — E1122 Type 2 diabetes mellitus with diabetic chronic kidney disease: Secondary | ICD-10-CM | POA: Diagnosis not present

## 2021-01-07 DIAGNOSIS — M2578 Osteophyte, vertebrae: Secondary | ICD-10-CM | POA: Diagnosis not present

## 2021-01-07 DIAGNOSIS — G8929 Other chronic pain: Secondary | ICD-10-CM | POA: Diagnosis not present

## 2021-01-07 DIAGNOSIS — M47812 Spondylosis without myelopathy or radiculopathy, cervical region: Secondary | ICD-10-CM | POA: Diagnosis not present

## 2021-01-07 DIAGNOSIS — M170 Bilateral primary osteoarthritis of knee: Secondary | ICD-10-CM | POA: Diagnosis not present

## 2021-01-07 DIAGNOSIS — M160 Bilateral primary osteoarthritis of hip: Secondary | ICD-10-CM | POA: Diagnosis not present

## 2021-01-07 DIAGNOSIS — M5031 Other cervical disc degeneration,  high cervical region: Secondary | ICD-10-CM | POA: Diagnosis not present

## 2021-01-07 DIAGNOSIS — M4726 Other spondylosis with radiculopathy, lumbar region: Secondary | ICD-10-CM | POA: Diagnosis not present

## 2021-01-07 DIAGNOSIS — E1142 Type 2 diabetes mellitus with diabetic polyneuropathy: Secondary | ICD-10-CM | POA: Diagnosis not present

## 2021-01-07 DIAGNOSIS — S82832D Other fracture of upper and lower end of left fibula, subsequent encounter for closed fracture with routine healing: Secondary | ICD-10-CM | POA: Diagnosis not present

## 2021-01-09 DIAGNOSIS — S82832D Other fracture of upper and lower end of left fibula, subsequent encounter for closed fracture with routine healing: Secondary | ICD-10-CM | POA: Diagnosis not present

## 2021-01-09 DIAGNOSIS — M170 Bilateral primary osteoarthritis of knee: Secondary | ICD-10-CM | POA: Diagnosis not present

## 2021-01-09 DIAGNOSIS — M47812 Spondylosis without myelopathy or radiculopathy, cervical region: Secondary | ICD-10-CM | POA: Diagnosis not present

## 2021-01-09 DIAGNOSIS — M2578 Osteophyte, vertebrae: Secondary | ICD-10-CM | POA: Diagnosis not present

## 2021-01-09 DIAGNOSIS — M160 Bilateral primary osteoarthritis of hip: Secondary | ICD-10-CM | POA: Diagnosis not present

## 2021-01-09 DIAGNOSIS — E1142 Type 2 diabetes mellitus with diabetic polyneuropathy: Secondary | ICD-10-CM | POA: Diagnosis not present

## 2021-01-09 DIAGNOSIS — M4726 Other spondylosis with radiculopathy, lumbar region: Secondary | ICD-10-CM | POA: Diagnosis not present

## 2021-01-09 DIAGNOSIS — M5031 Other cervical disc degeneration,  high cervical region: Secondary | ICD-10-CM | POA: Diagnosis not present

## 2021-01-09 DIAGNOSIS — G8929 Other chronic pain: Secondary | ICD-10-CM | POA: Diagnosis not present

## 2021-01-13 DIAGNOSIS — M160 Bilateral primary osteoarthritis of hip: Secondary | ICD-10-CM | POA: Diagnosis not present

## 2021-01-13 DIAGNOSIS — M47812 Spondylosis without myelopathy or radiculopathy, cervical region: Secondary | ICD-10-CM | POA: Diagnosis not present

## 2021-01-13 DIAGNOSIS — M4726 Other spondylosis with radiculopathy, lumbar region: Secondary | ICD-10-CM | POA: Diagnosis not present

## 2021-01-13 DIAGNOSIS — M170 Bilateral primary osteoarthritis of knee: Secondary | ICD-10-CM | POA: Diagnosis not present

## 2021-01-13 DIAGNOSIS — E1142 Type 2 diabetes mellitus with diabetic polyneuropathy: Secondary | ICD-10-CM | POA: Diagnosis not present

## 2021-01-13 DIAGNOSIS — M2578 Osteophyte, vertebrae: Secondary | ICD-10-CM | POA: Diagnosis not present

## 2021-01-13 DIAGNOSIS — S82832D Other fracture of upper and lower end of left fibula, subsequent encounter for closed fracture with routine healing: Secondary | ICD-10-CM | POA: Diagnosis not present

## 2021-01-13 DIAGNOSIS — G8929 Other chronic pain: Secondary | ICD-10-CM | POA: Diagnosis not present

## 2021-01-13 DIAGNOSIS — M5031 Other cervical disc degeneration,  high cervical region: Secondary | ICD-10-CM | POA: Diagnosis not present

## 2021-01-15 DIAGNOSIS — G8929 Other chronic pain: Secondary | ICD-10-CM | POA: Diagnosis not present

## 2021-01-15 DIAGNOSIS — E1142 Type 2 diabetes mellitus with diabetic polyneuropathy: Secondary | ICD-10-CM | POA: Diagnosis not present

## 2021-01-15 DIAGNOSIS — S82832D Other fracture of upper and lower end of left fibula, subsequent encounter for closed fracture with routine healing: Secondary | ICD-10-CM | POA: Diagnosis not present

## 2021-01-15 DIAGNOSIS — M4726 Other spondylosis with radiculopathy, lumbar region: Secondary | ICD-10-CM | POA: Diagnosis not present

## 2021-01-15 DIAGNOSIS — M170 Bilateral primary osteoarthritis of knee: Secondary | ICD-10-CM | POA: Diagnosis not present

## 2021-01-15 DIAGNOSIS — M160 Bilateral primary osteoarthritis of hip: Secondary | ICD-10-CM | POA: Diagnosis not present

## 2021-01-15 DIAGNOSIS — M5031 Other cervical disc degeneration,  high cervical region: Secondary | ICD-10-CM | POA: Diagnosis not present

## 2021-01-15 DIAGNOSIS — M47812 Spondylosis without myelopathy or radiculopathy, cervical region: Secondary | ICD-10-CM | POA: Diagnosis not present

## 2021-01-15 DIAGNOSIS — M2578 Osteophyte, vertebrae: Secondary | ICD-10-CM | POA: Diagnosis not present

## 2021-01-16 DIAGNOSIS — M170 Bilateral primary osteoarthritis of knee: Secondary | ICD-10-CM | POA: Diagnosis not present

## 2021-01-16 DIAGNOSIS — M2578 Osteophyte, vertebrae: Secondary | ICD-10-CM | POA: Diagnosis not present

## 2021-01-16 DIAGNOSIS — E1142 Type 2 diabetes mellitus with diabetic polyneuropathy: Secondary | ICD-10-CM | POA: Diagnosis not present

## 2021-01-16 DIAGNOSIS — G8929 Other chronic pain: Secondary | ICD-10-CM | POA: Diagnosis not present

## 2021-01-16 DIAGNOSIS — M4726 Other spondylosis with radiculopathy, lumbar region: Secondary | ICD-10-CM | POA: Diagnosis not present

## 2021-01-16 DIAGNOSIS — M5031 Other cervical disc degeneration,  high cervical region: Secondary | ICD-10-CM | POA: Diagnosis not present

## 2021-01-16 DIAGNOSIS — S82832D Other fracture of upper and lower end of left fibula, subsequent encounter for closed fracture with routine healing: Secondary | ICD-10-CM | POA: Diagnosis not present

## 2021-01-16 DIAGNOSIS — M160 Bilateral primary osteoarthritis of hip: Secondary | ICD-10-CM | POA: Diagnosis not present

## 2021-01-16 DIAGNOSIS — M47812 Spondylosis without myelopathy or radiculopathy, cervical region: Secondary | ICD-10-CM | POA: Diagnosis not present

## 2021-01-20 DIAGNOSIS — E1140 Type 2 diabetes mellitus with diabetic neuropathy, unspecified: Secondary | ICD-10-CM | POA: Diagnosis not present

## 2021-01-20 DIAGNOSIS — I10 Essential (primary) hypertension: Secondary | ICD-10-CM | POA: Diagnosis not present

## 2021-01-20 DIAGNOSIS — E1165 Type 2 diabetes mellitus with hyperglycemia: Secondary | ICD-10-CM | POA: Diagnosis not present

## 2021-01-20 DIAGNOSIS — S82402A Unspecified fracture of shaft of left fibula, initial encounter for closed fracture: Secondary | ICD-10-CM | POA: Diagnosis not present

## 2021-01-20 DIAGNOSIS — M545 Low back pain, unspecified: Secondary | ICD-10-CM | POA: Diagnosis not present

## 2021-01-22 DIAGNOSIS — M4726 Other spondylosis with radiculopathy, lumbar region: Secondary | ICD-10-CM | POA: Diagnosis not present

## 2021-01-22 DIAGNOSIS — E1142 Type 2 diabetes mellitus with diabetic polyneuropathy: Secondary | ICD-10-CM | POA: Diagnosis not present

## 2021-01-22 DIAGNOSIS — S82832D Other fracture of upper and lower end of left fibula, subsequent encounter for closed fracture with routine healing: Secondary | ICD-10-CM | POA: Diagnosis not present

## 2021-01-22 DIAGNOSIS — M160 Bilateral primary osteoarthritis of hip: Secondary | ICD-10-CM | POA: Diagnosis not present

## 2021-01-22 DIAGNOSIS — G8929 Other chronic pain: Secondary | ICD-10-CM | POA: Diagnosis not present

## 2021-01-22 DIAGNOSIS — M2578 Osteophyte, vertebrae: Secondary | ICD-10-CM | POA: Diagnosis not present

## 2021-01-22 DIAGNOSIS — M47812 Spondylosis without myelopathy or radiculopathy, cervical region: Secondary | ICD-10-CM | POA: Diagnosis not present

## 2021-01-22 DIAGNOSIS — M170 Bilateral primary osteoarthritis of knee: Secondary | ICD-10-CM | POA: Diagnosis not present

## 2021-01-22 DIAGNOSIS — M5031 Other cervical disc degeneration,  high cervical region: Secondary | ICD-10-CM | POA: Diagnosis not present

## 2021-01-25 ENCOUNTER — Emergency Department (HOSPITAL_COMMUNITY): Payer: Medicare HMO

## 2021-01-25 ENCOUNTER — Emergency Department (HOSPITAL_COMMUNITY)
Admission: EM | Admit: 2021-01-25 | Discharge: 2021-01-26 | Disposition: A | Discharge status: Home / Self Care | Payer: Medicare HMO | Attending: Emergency Medicine | Admitting: Emergency Medicine

## 2021-01-25 DIAGNOSIS — A4159 Other Gram-negative sepsis: Secondary | ICD-10-CM | POA: Diagnosis present

## 2021-01-25 DIAGNOSIS — Z7984 Long term (current) use of oral hypoglycemic drugs: Secondary | ICD-10-CM | POA: Insufficient documentation

## 2021-01-25 DIAGNOSIS — S01511A Laceration without foreign body of lip, initial encounter: Secondary | ICD-10-CM | POA: Diagnosis not present

## 2021-01-25 DIAGNOSIS — N390 Urinary tract infection, site not specified: Secondary | ICD-10-CM | POA: Diagnosis not present

## 2021-01-25 DIAGNOSIS — S199XXA Unspecified injury of neck, initial encounter: Secondary | ICD-10-CM | POA: Diagnosis not present

## 2021-01-25 DIAGNOSIS — Z7401 Bed confinement status: Secondary | ICD-10-CM | POA: Diagnosis not present

## 2021-01-25 DIAGNOSIS — E669 Obesity, unspecified: Secondary | ICD-10-CM | POA: Diagnosis present

## 2021-01-25 DIAGNOSIS — E7849 Other hyperlipidemia: Secondary | ICD-10-CM | POA: Diagnosis not present

## 2021-01-25 DIAGNOSIS — D631 Anemia in chronic kidney disease: Secondary | ICD-10-CM | POA: Diagnosis present

## 2021-01-25 DIAGNOSIS — R571 Hypovolemic shock: Secondary | ICD-10-CM | POA: Diagnosis present

## 2021-01-25 DIAGNOSIS — N179 Acute kidney failure, unspecified: Secondary | ICD-10-CM | POA: Diagnosis not present

## 2021-01-25 DIAGNOSIS — W108XXA Fall (on) (from) other stairs and steps, initial encounter: Secondary | ICD-10-CM | POA: Diagnosis not present

## 2021-01-25 DIAGNOSIS — Z7689 Persons encountering health services in other specified circumstances: Secondary | ICD-10-CM | POA: Diagnosis not present

## 2021-01-25 DIAGNOSIS — E1122 Type 2 diabetes mellitus with diabetic chronic kidney disease: Secondary | ICD-10-CM | POA: Insufficient documentation

## 2021-01-25 DIAGNOSIS — Z043 Encounter for examination and observation following other accident: Secondary | ICD-10-CM | POA: Diagnosis not present

## 2021-01-25 DIAGNOSIS — R6520 Severe sepsis without septic shock: Secondary | ICD-10-CM | POA: Diagnosis not present

## 2021-01-25 DIAGNOSIS — R52 Pain, unspecified: Secondary | ICD-10-CM | POA: Diagnosis not present

## 2021-01-25 DIAGNOSIS — E872 Acidosis: Secondary | ICD-10-CM | POA: Diagnosis present

## 2021-01-25 DIAGNOSIS — S0990XA Unspecified injury of head, initial encounter: Secondary | ICD-10-CM

## 2021-01-25 DIAGNOSIS — M7989 Other specified soft tissue disorders: Secondary | ICD-10-CM | POA: Diagnosis not present

## 2021-01-25 DIAGNOSIS — S0993XA Unspecified injury of face, initial encounter: Secondary | ICD-10-CM | POA: Diagnosis present

## 2021-01-25 DIAGNOSIS — I152 Hypertension secondary to endocrine disorders: Secondary | ICD-10-CM | POA: Diagnosis present

## 2021-01-25 DIAGNOSIS — Z01818 Encounter for other preprocedural examination: Secondary | ICD-10-CM | POA: Diagnosis not present

## 2021-01-25 DIAGNOSIS — Z23 Encounter for immunization: Secondary | ICD-10-CM | POA: Insufficient documentation

## 2021-01-25 DIAGNOSIS — K219 Gastro-esophageal reflux disease without esophagitis: Secondary | ICD-10-CM | POA: Diagnosis not present

## 2021-01-25 DIAGNOSIS — R069 Unspecified abnormalities of breathing: Secondary | ICD-10-CM | POA: Diagnosis not present

## 2021-01-25 DIAGNOSIS — R0689 Other abnormalities of breathing: Secondary | ICD-10-CM | POA: Diagnosis not present

## 2021-01-25 DIAGNOSIS — A419 Sepsis, unspecified organism: Secondary | ICD-10-CM | POA: Diagnosis not present

## 2021-01-25 DIAGNOSIS — I129 Hypertensive chronic kidney disease with stage 1 through stage 4 chronic kidney disease, or unspecified chronic kidney disease: Secondary | ICD-10-CM | POA: Insufficient documentation

## 2021-01-25 DIAGNOSIS — E871 Hypo-osmolality and hyponatremia: Secondary | ICD-10-CM | POA: Diagnosis present

## 2021-01-25 DIAGNOSIS — W19XXXA Unspecified fall, initial encounter: Secondary | ICD-10-CM | POA: Diagnosis not present

## 2021-01-25 DIAGNOSIS — Z66 Do not resuscitate: Secondary | ICD-10-CM | POA: Diagnosis present

## 2021-01-25 DIAGNOSIS — R918 Other nonspecific abnormal finding of lung field: Secondary | ICD-10-CM | POA: Diagnosis not present

## 2021-01-25 DIAGNOSIS — S14124A Central cord syndrome at C4 level of cervical spinal cord, initial encounter: Secondary | ICD-10-CM | POA: Diagnosis present

## 2021-01-25 DIAGNOSIS — G9520 Unspecified cord compression: Secondary | ICD-10-CM | POA: Diagnosis not present

## 2021-01-25 DIAGNOSIS — Z515 Encounter for palliative care: Secondary | ICD-10-CM | POA: Diagnosis not present

## 2021-01-25 DIAGNOSIS — Z79899 Other long term (current) drug therapy: Secondary | ICD-10-CM | POA: Diagnosis not present

## 2021-01-25 DIAGNOSIS — N3000 Acute cystitis without hematuria: Secondary | ICD-10-CM | POA: Diagnosis not present

## 2021-01-25 DIAGNOSIS — A414 Sepsis due to anaerobes: Secondary | ICD-10-CM | POA: Diagnosis not present

## 2021-01-25 DIAGNOSIS — B961 Klebsiella pneumoniae [K. pneumoniae] as the cause of diseases classified elsewhere: Secondary | ICD-10-CM | POA: Diagnosis not present

## 2021-01-25 DIAGNOSIS — G9341 Metabolic encephalopathy: Secondary | ICD-10-CM | POA: Diagnosis present

## 2021-01-25 DIAGNOSIS — M47816 Spondylosis without myelopathy or radiculopathy, lumbar region: Secondary | ICD-10-CM | POA: Diagnosis not present

## 2021-01-25 DIAGNOSIS — Z794 Long term (current) use of insulin: Secondary | ICD-10-CM | POA: Insufficient documentation

## 2021-01-25 DIAGNOSIS — E1110 Type 2 diabetes mellitus with ketoacidosis without coma: Secondary | ICD-10-CM | POA: Diagnosis not present

## 2021-01-25 DIAGNOSIS — G8250 Quadriplegia, unspecified: Secondary | ICD-10-CM | POA: Diagnosis present

## 2021-01-25 DIAGNOSIS — M4322 Fusion of spine, cervical region: Secondary | ICD-10-CM | POA: Diagnosis not present

## 2021-01-25 DIAGNOSIS — E1159 Type 2 diabetes mellitus with other circulatory complications: Secondary | ICD-10-CM | POA: Diagnosis not present

## 2021-01-25 DIAGNOSIS — Z452 Encounter for adjustment and management of vascular access device: Secondary | ICD-10-CM | POA: Diagnosis not present

## 2021-01-25 DIAGNOSIS — Z6830 Body mass index (BMI) 30.0-30.9, adult: Secondary | ICD-10-CM | POA: Diagnosis not present

## 2021-01-25 DIAGNOSIS — M19011 Primary osteoarthritis, right shoulder: Secondary | ICD-10-CM | POA: Diagnosis not present

## 2021-01-25 DIAGNOSIS — R6521 Severe sepsis with septic shock: Secondary | ICD-10-CM | POA: Diagnosis not present

## 2021-01-25 DIAGNOSIS — N189 Chronic kidney disease, unspecified: Secondary | ICD-10-CM | POA: Diagnosis not present

## 2021-01-25 DIAGNOSIS — M4802 Spinal stenosis, cervical region: Secondary | ICD-10-CM | POA: Diagnosis not present

## 2021-01-25 DIAGNOSIS — M25511 Pain in right shoulder: Secondary | ICD-10-CM | POA: Diagnosis not present

## 2021-01-25 DIAGNOSIS — M546 Pain in thoracic spine: Secondary | ICD-10-CM | POA: Diagnosis not present

## 2021-01-25 DIAGNOSIS — M545 Low back pain, unspecified: Secondary | ICD-10-CM | POA: Diagnosis not present

## 2021-01-25 DIAGNOSIS — G992 Myelopathy in diseases classified elsewhere: Secondary | ICD-10-CM | POA: Diagnosis present

## 2021-01-25 DIAGNOSIS — R339 Retention of urine, unspecified: Secondary | ICD-10-CM | POA: Diagnosis present

## 2021-01-25 DIAGNOSIS — Z981 Arthrodesis status: Secondary | ICD-10-CM | POA: Diagnosis not present

## 2021-01-25 DIAGNOSIS — I809 Phlebitis and thrombophlebitis of unspecified site: Secondary | ICD-10-CM | POA: Diagnosis not present

## 2021-01-25 DIAGNOSIS — S14124D Central cord syndrome at C4 level of cervical spinal cord, subsequent encounter: Secondary | ICD-10-CM | POA: Diagnosis not present

## 2021-01-25 DIAGNOSIS — R5381 Other malaise: Secondary | ICD-10-CM | POA: Diagnosis not present

## 2021-01-25 DIAGNOSIS — N170 Acute kidney failure with tubular necrosis: Secondary | ICD-10-CM | POA: Diagnosis present

## 2021-01-25 DIAGNOSIS — E1169 Type 2 diabetes mellitus with other specified complication: Secondary | ICD-10-CM | POA: Diagnosis present

## 2021-01-25 DIAGNOSIS — N1832 Chronic kidney disease, stage 3b: Secondary | ICD-10-CM | POA: Diagnosis present

## 2021-01-25 DIAGNOSIS — S0003XA Contusion of scalp, initial encounter: Secondary | ICD-10-CM | POA: Diagnosis not present

## 2021-01-25 DIAGNOSIS — M25519 Pain in unspecified shoulder: Secondary | ICD-10-CM | POA: Diagnosis not present

## 2021-01-25 DIAGNOSIS — N183 Chronic kidney disease, stage 3 unspecified: Secondary | ICD-10-CM | POA: Insufficient documentation

## 2021-01-25 DIAGNOSIS — I4891 Unspecified atrial fibrillation: Secondary | ICD-10-CM | POA: Diagnosis not present

## 2021-01-25 DIAGNOSIS — R7881 Bacteremia: Secondary | ICD-10-CM | POA: Diagnosis not present

## 2021-01-25 DIAGNOSIS — Z7189 Other specified counseling: Secondary | ICD-10-CM | POA: Diagnosis not present

## 2021-01-25 DIAGNOSIS — E1140 Type 2 diabetes mellitus with diabetic neuropathy, unspecified: Secondary | ICD-10-CM | POA: Diagnosis not present

## 2021-01-25 DIAGNOSIS — M19012 Primary osteoarthritis, left shoulder: Secondary | ICD-10-CM | POA: Diagnosis not present

## 2021-01-25 DIAGNOSIS — N182 Chronic kidney disease, stage 2 (mild): Secondary | ICD-10-CM | POA: Diagnosis not present

## 2021-01-25 DIAGNOSIS — S80919A Unspecified superficial injury of unspecified knee, initial encounter: Secondary | ICD-10-CM | POA: Diagnosis not present

## 2021-01-25 DIAGNOSIS — Z20822 Contact with and (suspected) exposure to covid-19: Secondary | ICD-10-CM | POA: Diagnosis present

## 2021-01-25 DIAGNOSIS — M25512 Pain in left shoulder: Secondary | ICD-10-CM | POA: Diagnosis not present

## 2021-01-25 DIAGNOSIS — E1142 Type 2 diabetes mellitus with diabetic polyneuropathy: Secondary | ICD-10-CM | POA: Diagnosis present

## 2021-01-25 DIAGNOSIS — I959 Hypotension, unspecified: Secondary | ICD-10-CM | POA: Diagnosis not present

## 2021-01-25 DIAGNOSIS — D6959 Other secondary thrombocytopenia: Secondary | ICD-10-CM | POA: Diagnosis not present

## 2021-01-25 DIAGNOSIS — G9340 Encephalopathy, unspecified: Secondary | ICD-10-CM | POA: Diagnosis not present

## 2021-01-25 DIAGNOSIS — M4712 Other spondylosis with myelopathy, cervical region: Secondary | ICD-10-CM | POA: Diagnosis not present

## 2021-01-25 DIAGNOSIS — S0181XA Laceration without foreign body of other part of head, initial encounter: Secondary | ICD-10-CM | POA: Diagnosis not present

## 2021-01-25 DIAGNOSIS — G9529 Other cord compression: Secondary | ICD-10-CM | POA: Diagnosis not present

## 2021-01-25 DIAGNOSIS — W109XXA Fall (on) (from) unspecified stairs and steps, initial encounter: Secondary | ICD-10-CM | POA: Diagnosis present

## 2021-01-25 DIAGNOSIS — R69 Illness, unspecified: Secondary | ICD-10-CM | POA: Diagnosis not present

## 2021-01-25 LAB — COMPREHENSIVE METABOLIC PANEL
ALT: 16 U/L (ref 0–44)
AST: 22 U/L (ref 15–41)
Albumin: 3.5 g/dL (ref 3.5–5.0)
Alkaline Phosphatase: 56 U/L (ref 38–126)
Anion gap: 10 (ref 5–15)
BUN: 40 mg/dL — ABNORMAL HIGH (ref 8–23)
CO2: 22 mmol/L (ref 22–32)
Calcium: 8.7 mg/dL — ABNORMAL LOW (ref 8.9–10.3)
Chloride: 103 mmol/L (ref 98–111)
Creatinine, Ser: 1.27 mg/dL — ABNORMAL HIGH (ref 0.44–1.00)
GFR, Estimated: 42 mL/min — ABNORMAL LOW (ref >60)
Glucose, Bld: 152 mg/dL — ABNORMAL HIGH (ref 70–99)
Potassium: 4.2 mmol/L (ref 3.5–5.1)
Sodium: 135 mmol/L (ref 135–145)
Total Bilirubin: 0.3 mg/dL (ref 0.3–1.2)
Total Protein: 6.4 g/dL — ABNORMAL LOW (ref 6.5–8.1)

## 2021-01-25 LAB — CBC WITH DIFFERENTIAL/PLATELET
Abs Immature Granulocytes: 0.02 10*3/uL (ref 0.00–0.07)
Basophils Absolute: 0 10*3/uL (ref 0.0–0.1)
Basophils Relative: 1 %
Eosinophils Absolute: 0.1 10*3/uL (ref 0.0–0.5)
Eosinophils Relative: 2 %
HCT: 31.7 % — ABNORMAL LOW (ref 36.0–46.0)
Hemoglobin: 10.6 g/dL — ABNORMAL LOW (ref 12.0–15.0)
Immature Granulocytes: 0 %
Lymphocytes Relative: 44 %
Lymphs Abs: 2.9 10*3/uL (ref 0.7–4.0)
MCH: 30.5 pg (ref 26.0–34.0)
MCHC: 33.4 g/dL (ref 30.0–36.0)
MCV: 91.1 fL (ref 80.0–100.0)
Monocytes Absolute: 0.5 10*3/uL (ref 0.1–1.0)
Monocytes Relative: 7 %
Neutro Abs: 3 10*3/uL (ref 1.7–7.7)
Neutrophils Relative %: 46 %
Platelets: 227 10*3/uL (ref 150–400)
RBC: 3.48 MIL/uL — ABNORMAL LOW (ref 3.87–5.11)
RDW: 13.8 % (ref 11.5–15.5)
WBC: 6.4 10*3/uL (ref 4.0–10.5)
nRBC: 0 % (ref 0.0–0.2)

## 2021-01-25 LAB — ETHANOL: Alcohol, Ethyl (B): <10 mg/dL (ref <10)

## 2021-01-25 MED ORDER — MORPHINE SULFATE (PF) 4 MG/ML IV SOLN
4.0000 mg | Freq: Once | INTRAVENOUS | Status: AC
  Administered 2021-01-25: 4 mg via INTRAVENOUS
  Filled 2021-01-25: qty 1

## 2021-01-25 MED ORDER — BUPIVACAINE HCL (PF) 0.5 % IJ SOLN
10.0000 mL | Freq: Once | INTRAMUSCULAR | Status: AC
  Administered 2021-01-25: 10 mL
  Filled 2021-01-25: qty 10

## 2021-01-25 MED ORDER — FENTANYL CITRATE (PF) 100 MCG/2ML IJ SOLN
50.0000 ug | Freq: Once | INTRAMUSCULAR | Status: AC
Start: 2021-01-25 — End: 2021-01-25
  Administered 2021-01-25: 50 ug via INTRAVENOUS
  Filled 2021-01-25: qty 2

## 2021-01-25 MED ORDER — LIDOCAINE-EPINEPHRINE (PF) 2 %-1:200000 IJ SOLN
10.0000 mL | Freq: Once | INTRAMUSCULAR | Status: AC
  Administered 2021-01-25: 10 mL
  Filled 2021-01-25: qty 20

## 2021-01-25 MED ORDER — TETANUS-DIPHTH-ACELL PERTUSSIS 5-2.5-18.5 LF-MCG/0.5 IM SUSY
0.5000 mL | PREFILLED_SYRINGE | Freq: Once | INTRAMUSCULAR | Status: AC
  Administered 2021-01-25: 0.5 mL via INTRAMUSCULAR
  Filled 2021-01-25: qty 0.5

## 2021-01-25 MED ORDER — ONDANSETRON HCL 4 MG/2ML IJ SOLN
4.0000 mg | Freq: Once | INTRAMUSCULAR | Status: AC
  Administered 2021-01-25: 4 mg via INTRAVENOUS
  Filled 2021-01-25: qty 2

## 2021-01-25 NOTE — ED Triage Notes (Signed)
Patient to ED via EMS for complaints of bilateral shoulder pain after fall. States she "had a commotion" and fell down 2 stairs face first. States that she did not have LOC. Laceration to upper lip. ROM present in all extremities. 10/10 pain.
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
## 2021-01-25 NOTE — Discharge Instructions (Addendum)
  You have been seen today for multiple injuries There were no acute abnormalities on the x-rays, including no sign of fracture or dislocation, however, there could be injuries to the soft tissues, such as the ligaments or tendons that are not seen on xrays. There could also be what are called occult fractures that are small fractures not seen on xray. Pain: Ibuprofen, Naproxen, or tylenol for pain. Ice: May apply ice to the area over the next 24 hours for 15 minutes at a time to reduce swelling. Elevation: Keep the extremity elevated as often as possible to reduce pain and inflammation. Support: Wear the sling for support and comfort. Wear this until pain resolves.   Follow up: If symptoms are improving, you may follow up with your primary care provider for any continued management. If symptoms are not starting to improve within a week, you should follow up with the orthopedic specialist within two weeks. Return: Return to the ED for numbness, weakness, increasing pain, overall worsening symptoms, loss of function, or if symptoms are not improving, you have tried to follow up with the orthopedic specialist, and have been unable to do so.  For prescription assistance, may try using prescription discount sites or apps, such as goodrx.com or Good [employer] smart phone app.   Wound Care - Laceration You may remove the bandage after 24 hours. Clean the wound and surrounding area gently with tap water and mild soap. Rinse well and blot dry. Do not scrub the wound, as this may cause the wound edges to come apart. You may shower, but avoid submerging the wound, such as with a bath or swimming. Clean the wound daily to prevent infection. Do not use cleaners such as hydrogen peroxide or alcohol.   Scar reduction: Application of a topical antibiotic ointment, such as Neosporin, after the wound has begun to close and heal well can decrease scab formation and reduce scarring. After the wound has healed and wound  closures have been removed, application of ointments such as Aquaphor can also reduce scar formation.  The key to scar reduction is keeping the skin well hydrated and supple. Drinking plenty of water throughout the day (At least eight 8oz glasses of water a day) is essential to staying well hydrated.  Sun exposure: Keep the wound out of the sun. After the wound has healed, continue to protect it from the sun by wearing protective clothing or applying sunscreen.  Pain: You may use Tylenol, naproxen, or ibuprofen for pain.  Suture/staple removal: The sutures will dissolve on their own.  No action is necessary on your part.  Return to the ED sooner should the wound edges come apart or signs of infection arise, such as spreading redness, puffiness/swelling, pus draining from the wound, severe increase in pain, fever over 100.80F, or any other major issues.  For prescription assistance, may try using prescription discount sites or apps, such as goodrx.com

## 2021-01-25 NOTE — ED Notes (Signed)
Medicated per [facility] Patient to imaging

## 2021-01-26 ENCOUNTER — Other Ambulatory Visit: Payer: Self-pay

## 2021-01-26 ENCOUNTER — Inpatient Hospital Stay (HOSPITAL_COMMUNITY)
Admission: EM | Admit: 2021-01-26 | Discharge: 2021-02-11 | DRG: 853 | Disposition: A | Discharge status: Skilled Nursing Facility | Payer: Medicare HMO | Attending: Neurosurgery | Admitting: Neurosurgery

## 2021-01-26 ENCOUNTER — Emergency Department (HOSPITAL_COMMUNITY): Payer: Medicare HMO

## 2021-01-26 ENCOUNTER — Encounter (HOSPITAL_COMMUNITY): Payer: Self-pay

## 2021-01-26 DIAGNOSIS — B961 Klebsiella pneumoniae [K. pneumoniae] as the cause of diseases classified elsewhere: Secondary | ICD-10-CM | POA: Diagnosis present

## 2021-01-26 DIAGNOSIS — N3000 Acute cystitis without hematuria: Secondary | ICD-10-CM | POA: Diagnosis not present

## 2021-01-26 DIAGNOSIS — E876 Hypokalemia: Secondary | ICD-10-CM | POA: Diagnosis present

## 2021-01-26 DIAGNOSIS — R6521 Severe sepsis with septic shock: Secondary | ICD-10-CM | POA: Diagnosis not present

## 2021-01-26 DIAGNOSIS — Z7401 Bed confinement status: Secondary | ICD-10-CM | POA: Diagnosis not present

## 2021-01-26 DIAGNOSIS — R069 Unspecified abnormalities of breathing: Secondary | ICD-10-CM

## 2021-01-26 DIAGNOSIS — S80919A Unspecified superficial injury of unspecified knee, initial encounter: Secondary | ICD-10-CM | POA: Diagnosis not present

## 2021-01-26 DIAGNOSIS — W19XXXA Unspecified fall, initial encounter: Secondary | ICD-10-CM

## 2021-01-26 DIAGNOSIS — Z885 Allergy status to narcotic agent status: Secondary | ICD-10-CM

## 2021-01-26 DIAGNOSIS — A414 Sepsis due to anaerobes: Secondary | ICD-10-CM

## 2021-01-26 DIAGNOSIS — D6959 Other secondary thrombocytopenia: Secondary | ICD-10-CM | POA: Diagnosis not present

## 2021-01-26 DIAGNOSIS — N1832 Chronic kidney disease, stage 3b: Secondary | ICD-10-CM | POA: Diagnosis present

## 2021-01-26 DIAGNOSIS — G9520 Unspecified cord compression: Secondary | ICD-10-CM | POA: Diagnosis present

## 2021-01-26 DIAGNOSIS — N179 Acute kidney failure, unspecified: Secondary | ICD-10-CM | POA: Diagnosis not present

## 2021-01-26 DIAGNOSIS — Z23 Encounter for immunization: Secondary | ICD-10-CM

## 2021-01-26 DIAGNOSIS — E861 Hypovolemia: Secondary | ICD-10-CM | POA: Diagnosis present

## 2021-01-26 DIAGNOSIS — G9341 Metabolic encephalopathy: Secondary | ICD-10-CM | POA: Diagnosis present

## 2021-01-26 DIAGNOSIS — Z90710 Acquired absence of both cervix and uterus: Secondary | ICD-10-CM

## 2021-01-26 DIAGNOSIS — W19XXXD Unspecified fall, subsequent encounter: Secondary | ICD-10-CM

## 2021-01-26 DIAGNOSIS — N39490 Overflow incontinence: Secondary | ICD-10-CM | POA: Diagnosis present

## 2021-01-26 DIAGNOSIS — E1169 Type 2 diabetes mellitus with other specified complication: Secondary | ICD-10-CM | POA: Diagnosis present

## 2021-01-26 DIAGNOSIS — Z7984 Long term (current) use of oral hypoglycemic drugs: Secondary | ICD-10-CM

## 2021-01-26 DIAGNOSIS — L03113 Cellulitis of right upper limb: Secondary | ICD-10-CM | POA: Diagnosis not present

## 2021-01-26 DIAGNOSIS — E669 Obesity, unspecified: Secondary | ICD-10-CM | POA: Diagnosis present

## 2021-01-26 DIAGNOSIS — S0990XA Unspecified injury of head, initial encounter: Secondary | ICD-10-CM | POA: Diagnosis present

## 2021-01-26 DIAGNOSIS — R52 Pain, unspecified: Secondary | ICD-10-CM | POA: Diagnosis not present

## 2021-01-26 DIAGNOSIS — G8250 Quadriplegia, unspecified: Secondary | ICD-10-CM | POA: Diagnosis present

## 2021-01-26 DIAGNOSIS — R531 Weakness: Secondary | ICD-10-CM

## 2021-01-26 DIAGNOSIS — K219 Gastro-esophageal reflux disease without esophagitis: Secondary | ICD-10-CM | POA: Diagnosis present

## 2021-01-26 DIAGNOSIS — S0181XD Laceration without foreign body of other part of head, subsequent encounter: Secondary | ICD-10-CM

## 2021-01-26 DIAGNOSIS — N189 Chronic kidney disease, unspecified: Secondary | ICD-10-CM | POA: Diagnosis present

## 2021-01-26 DIAGNOSIS — W109XXA Fall (on) (from) unspecified stairs and steps, initial encounter: Secondary | ICD-10-CM | POA: Diagnosis present

## 2021-01-26 DIAGNOSIS — M2578 Osteophyte, vertebrae: Secondary | ICD-10-CM | POA: Diagnosis present

## 2021-01-26 DIAGNOSIS — Z794 Long term (current) use of insulin: Secondary | ICD-10-CM

## 2021-01-26 DIAGNOSIS — R601 Generalized edema: Secondary | ICD-10-CM | POA: Diagnosis not present

## 2021-01-26 DIAGNOSIS — I129 Hypertensive chronic kidney disease with stage 1 through stage 4 chronic kidney disease, or unspecified chronic kidney disease: Secondary | ICD-10-CM | POA: Diagnosis present

## 2021-01-26 DIAGNOSIS — R69 Illness, unspecified: Secondary | ICD-10-CM | POA: Diagnosis not present

## 2021-01-26 DIAGNOSIS — Z66 Do not resuscitate: Secondary | ICD-10-CM | POA: Diagnosis present

## 2021-01-26 DIAGNOSIS — G992 Myelopathy in diseases classified elsewhere: Secondary | ICD-10-CM | POA: Diagnosis present

## 2021-01-26 DIAGNOSIS — N170 Acute kidney failure with tubular necrosis: Secondary | ICD-10-CM | POA: Diagnosis present

## 2021-01-26 DIAGNOSIS — I808 Phlebitis and thrombophlebitis of other sites: Secondary | ICD-10-CM | POA: Diagnosis not present

## 2021-01-26 DIAGNOSIS — Z419 Encounter for procedure for purposes other than remedying health state, unspecified: Secondary | ICD-10-CM

## 2021-01-26 DIAGNOSIS — Z8249 Family history of ischemic heart disease and other diseases of the circulatory system: Secondary | ICD-10-CM

## 2021-01-26 DIAGNOSIS — S14124A Central cord syndrome at C4 level of cervical spinal cord, initial encounter: Secondary | ICD-10-CM | POA: Diagnosis present

## 2021-01-26 DIAGNOSIS — Z09 Encounter for follow-up examination after completed treatment for conditions other than malignant neoplasm: Secondary | ICD-10-CM

## 2021-01-26 DIAGNOSIS — Z981 Arthrodesis status: Secondary | ICD-10-CM

## 2021-01-26 DIAGNOSIS — E1140 Type 2 diabetes mellitus with diabetic neuropathy, unspecified: Secondary | ICD-10-CM | POA: Diagnosis present

## 2021-01-26 DIAGNOSIS — D631 Anemia in chronic kidney disease: Secondary | ICD-10-CM | POA: Diagnosis present

## 2021-01-26 DIAGNOSIS — A4159 Other Gram-negative sepsis: Principal | ICD-10-CM | POA: Diagnosis present

## 2021-01-26 DIAGNOSIS — R339 Retention of urine, unspecified: Secondary | ICD-10-CM

## 2021-01-26 DIAGNOSIS — S01511A Laceration without foreign body of lip, initial encounter: Secondary | ICD-10-CM | POA: Diagnosis not present

## 2021-01-26 DIAGNOSIS — R5381 Other malaise: Secondary | ICD-10-CM | POA: Diagnosis not present

## 2021-01-26 DIAGNOSIS — R471 Dysarthria and anarthria: Secondary | ICD-10-CM | POA: Diagnosis present

## 2021-01-26 DIAGNOSIS — M25519 Pain in unspecified shoulder: Secondary | ICD-10-CM | POA: Diagnosis not present

## 2021-01-26 DIAGNOSIS — Z6830 Body mass index (BMI) 30.0-30.9, adult: Secondary | ICD-10-CM

## 2021-01-26 DIAGNOSIS — E785 Hyperlipidemia, unspecified: Secondary | ICD-10-CM | POA: Diagnosis present

## 2021-01-26 DIAGNOSIS — E872 Acidosis, unspecified: Secondary | ICD-10-CM

## 2021-01-26 DIAGNOSIS — R34 Anuria and oliguria: Secondary | ICD-10-CM | POA: Diagnosis not present

## 2021-01-26 DIAGNOSIS — M4802 Spinal stenosis, cervical region: Secondary | ICD-10-CM | POA: Diagnosis present

## 2021-01-26 DIAGNOSIS — N390 Urinary tract infection, site not specified: Secondary | ICD-10-CM | POA: Diagnosis present

## 2021-01-26 DIAGNOSIS — E860 Dehydration: Secondary | ICD-10-CM | POA: Diagnosis present

## 2021-01-26 DIAGNOSIS — R571 Hypovolemic shock: Secondary | ICD-10-CM | POA: Diagnosis present

## 2021-01-26 DIAGNOSIS — E1142 Type 2 diabetes mellitus with diabetic polyneuropathy: Secondary | ICD-10-CM | POA: Diagnosis present

## 2021-01-26 DIAGNOSIS — S0181XA Laceration without foreign body of other part of head, initial encounter: Secondary | ICD-10-CM | POA: Diagnosis not present

## 2021-01-26 DIAGNOSIS — M25511 Pain in right shoulder: Secondary | ICD-10-CM | POA: Diagnosis not present

## 2021-01-26 DIAGNOSIS — E1159 Type 2 diabetes mellitus with other circulatory complications: Secondary | ICD-10-CM | POA: Diagnosis present

## 2021-01-26 DIAGNOSIS — I4891 Unspecified atrial fibrillation: Secondary | ICD-10-CM | POA: Diagnosis not present

## 2021-01-26 DIAGNOSIS — E871 Hypo-osmolality and hyponatremia: Secondary | ICD-10-CM | POA: Diagnosis present

## 2021-01-26 DIAGNOSIS — Z79899 Other long term (current) drug therapy: Secondary | ICD-10-CM

## 2021-01-26 DIAGNOSIS — I152 Hypertension secondary to endocrine disorders: Secondary | ICD-10-CM | POA: Diagnosis present

## 2021-01-26 DIAGNOSIS — M62838 Other muscle spasm: Secondary | ICD-10-CM | POA: Diagnosis not present

## 2021-01-26 DIAGNOSIS — Z79891 Long term (current) use of opiate analgesic: Secondary | ICD-10-CM

## 2021-01-26 DIAGNOSIS — E1122 Type 2 diabetes mellitus with diabetic chronic kidney disease: Secondary | ICD-10-CM | POA: Diagnosis present

## 2021-01-26 DIAGNOSIS — Z20822 Contact with and (suspected) exposure to covid-19: Secondary | ICD-10-CM | POA: Diagnosis present

## 2021-01-26 LAB — RESP PANEL BY RT-PCR (FLU A&B, COVID) ARPGX2
Influenza A by PCR: NEGATIVE
Influenza B by PCR: NEGATIVE
SARS Coronavirus 2 by RT PCR: NEGATIVE

## 2021-01-26 MED ORDER — PANTOPRAZOLE SODIUM 40 MG PO TBEC
40.0000 mg | DELAYED_RELEASE_TABLET | Freq: Every day | ORAL | Status: DC
  Administered 2021-01-27 – 2021-02-12 (×15): 40 mg via ORAL
  Filled 2021-01-26 (×16): qty 1

## 2021-01-26 MED ORDER — METHOCARBAMOL 500 MG PO TABS
500.0000 mg | ORAL_TABLET | Freq: Three times a day (TID) | ORAL | Status: DC
Start: 2021-01-26 — End: 2021-01-29
  Administered 2021-01-26 – 2021-01-29 (×11): 500 mg via ORAL
  Filled 2021-01-26 (×11): qty 1

## 2021-01-26 MED ORDER — LISINOPRIL 10 MG PO TABS
10.0000 mg | ORAL_TABLET | Freq: Every day | ORAL | Status: DC
  Administered 2021-01-26 – 2021-01-29 (×4): 10 mg via ORAL
  Filled 2021-01-26 (×4): qty 1

## 2021-01-26 MED ORDER — HYDROCODONE-ACETAMINOPHEN 5-325 MG PO TABS
1.0000 | ORAL_TABLET | Freq: Once | ORAL | Status: AC
  Administered 2021-01-26: 1 via ORAL
  Filled 2021-01-26: qty 1

## 2021-01-26 MED ORDER — HYDROCODONE-ACETAMINOPHEN 5-325 MG PO TABS
1.0000 | ORAL_TABLET | Freq: Four times a day (QID) | ORAL | Status: DC | PRN
  Administered 2021-01-26 – 2021-02-12 (×28): 1 via ORAL
  Filled 2021-01-26 (×30): qty 1

## 2021-01-26 MED ORDER — GABAPENTIN 300 MG PO CAPS
300.0000 mg | ORAL_CAPSULE | Freq: Two times a day (BID) | ORAL | Status: DC
  Administered 2021-01-26 – 2021-01-30 (×9): 300 mg via ORAL
  Filled 2021-01-26 (×9): qty 1

## 2021-01-26 MED ORDER — HYDROCODONE-ACETAMINOPHEN 5-325 MG PO TABS: 1.0000 | ORAL_TABLET | Freq: Four times a day (QID) | ORAL | 0 refills | Status: DC | PRN

## 2021-01-26 MED ORDER — ATORVASTATIN CALCIUM 10 MG PO TABS
20.0000 mg | ORAL_TABLET | Freq: Every evening | ORAL | Status: DC
  Administered 2021-01-26 – 2021-02-12 (×16): 20 mg via ORAL
  Filled 2021-01-26 (×16): qty 2

## 2021-01-26 MED ORDER — METFORMIN HCL 500 MG PO TABS
500.0000 mg | ORAL_TABLET | Freq: Two times a day (BID) | ORAL | Status: DC
  Administered 2021-01-26 – 2021-01-29 (×7): 500 mg via ORAL
  Filled 2021-01-26 (×7): qty 1

## 2021-01-26 MED ORDER — MIRTAZAPINE 15 MG PO TABS
15.0000 mg | ORAL_TABLET | Freq: Every day | ORAL | Status: DC
  Administered 2021-01-26 – 2021-01-29 (×4): 15 mg via ORAL
  Filled 2021-01-26 (×4): qty 1

## 2021-01-26 MED ORDER — OXYCODONE-ACETAMINOPHEN 5-325 MG PO TABS
2.0000 | ORAL_TABLET | Freq: Once | ORAL | Status: AC
Start: 2021-01-26 — End: 2021-01-26
  Administered 2021-01-26: 2 via ORAL
  Filled 2021-01-26: qty 2

## 2021-01-26 NOTE — Clinical Social Work Note (Signed)
CSW received consult for patient placement. Patient reported that she is not able to bare any weight after fall and patient's children are not able to care for her during the day. Patient reported that she would like to follow up with the [facility] program which she reported that she was approved for. CSW contacted the oncall service with the pace program who reported that patient had not been admitted into program and that she may have been admitted for July since she is not showing in their system. On call reported that intake would be able to provide more information but would not be open until Tuesday 01/28/2021. ED MD reported that a PT order has been placed for patient to assess for SNF. Patient reported that she had been to SNF in April. CSW reported that patient may not meed the required days of wellness for insurance auth, but CSW will inquire more. TOC to follow.

## 2021-01-26 NOTE — ED Notes (Signed)
At this time this RN attempted to discharge patient, patient states she cannot move legs and refusing to bear weight on feet. With assistance of this RN and ED tech patient was able to sit on side of bed, but was unable to stand to transfer to wheelchair. Patient placed back in bed at this time.

## 2021-01-26 NOTE — ED Notes (Signed)
This RN discussed with patient and family member at bedside that it could be 12 hours or more before [facility] transport is available, patient verbalized understanding.

## 2021-01-26 NOTE — ED Provider Notes (Signed)
Uncertain Provider Note   CSN: [id number] Arrival date & time: 01/26/21  [id number]     History Chief Complaint  Patient presents with  . Fall    [name] is a 85 y.o. female.  Pt fell down 5 stairs yesterday.  Pt was seen at [facility] ED.  Family called EMS today.  Pt complains of pain.  Pt did not get rx filled. Family member reports pt could not walk today.  Pt reports she can not move she hurts so much.  Pt complains of pain in right shoulder and left knee.  Pt his her head and had sutures placed yesterday.  Pt lives a lone.  Family member states she can not care for pt.   The history is provided by the patient. No language interpreter was used.  Fall This is a recurrent problem. The current episode started yesterday. The problem has not changed since onset.Nothing aggravates the symptoms. Nothing relieves the symptoms. She has tried nothing for the symptoms. The treatment provided no relief.       Past Medical History:  Diagnosis Date  . Diabetes mellitus without complication ([place])   . High cholesterol   . Neck pain   . Neuropathy     Patient Active Problem List   Diagnosis Date Noted  . Closed fracture of proximal end of left fibula with routine healing 12/19/2020  . CKD (chronic kidney disease), stage III ([place]) 12/11/2020  . Hyperlipidemia associated with type 2 diabetes mellitus ([place]) 12/05/2020  . Type 2 diabetes mellitus with diabetic neuropathy, unspecified ([place]) 12/05/2020  . GERD without esophagitis 12/05/2020  . Chronic constipation 12/05/2020  . Lumbar radicular syndrome 12/05/2020  . Diabetic peripheral neuropathy ([place]) 12/05/2020  . Avascular necrosis of bones of both hips ([place]) 12/05/2020  . Uncontrolled pain 12/03/2020  . Leg pain 12/02/2020  . Left hip pain 12/02/2020  . Fracture of fibula alone 12/02/2020  . Inability to bear weight 12/02/2020  . High cholesterol   . Neuropathy   . Hypertension associated with diabetes ([place])    . Hyperglycemia   . AKI (acute kidney injury) ([place])   . Normocytic anemia   . Bilateral sensorineural hearing loss 05/02/2020  . Cervical spondylosis with myelopathy 01/27/2019  . Degeneration of lumbar intervertebral disc 12/12/2018  . Cervical spine pain 11/07/2018    Past Surgical History:  Procedure Laterality Date  . ABDOMINAL HYSTERECTOMY    . APPENDECTOMY    . BACK SURGERY    . CERVICAL DISC SURGERY    . HAND SURGERY    . KNEE SURGERY       OB History   No obstetric history on file.     Family History  Problem Relation Age of Onset  . Cardiomyopathy Father   . Cancer Mother     Social History   Tobacco Use  . Smoking status: Never Smoker  . Smokeless tobacco: Never Used  Vaping Use  . Vaping Use: Never used  Substance Use Topics  . Alcohol use: Never  . Drug use: Never    Home Medications Prior to Admission medications   Medication Sig Start Date End Date Taking? Authorizing Provider  acetaminophen (TYLENOL) 325 MG tablet Take 2 tablets (650 mg total) by mouth every 6 (six) hours. 12/04/20   Roxan Hockey, MD  albuterol (VENTOLIN HFA) 108 (90 Base) MCG/ACT inhaler Inhale 1 puff into the lungs every 4 (four) hours as needed for wheezing or shortness of breath.  [provider]  atorvastatin (LIPITOR) 20 MG tablet Take 1 tablet (20 mg total) by mouth every evening. 12/20/20   Gerlene Fee, NP  bisacodyl (DULCOLAX) 10 MG suppository Place 1 suppository (10 mg total) rectally daily as needed for moderate constipation. 12/04/20   Roxan Hockey, MD  gabapentin (NEURONTIN) 300 MG capsule Take 1 capsule (300 mg total) by mouth 2 (two) times daily. 12/20/20   Gerlene Fee, NP  HYDROcodone-acetaminophen (NORCO/VICODIN) 5-325 MG tablet Take 1 tablet by mouth every 6 (six) hours as needed for severe pain. 01/26/21   Joy, Shawn C, PA-C  insulin aspart (NOVOLOG) 100 UNIT/ML injection Inject 5 Units into the skin 3 (three) times daily before  meals. Patient not taking: Reported on 12/23/2020 12/20/20   Gerlene Fee, NP  LANTUS SOLOSTAR 100 UNIT/ML Solostar Pen Inject 30 Units into the skin daily. 12/20/20   Gerlene Fee, NP  lisinopril (ZESTRIL) 10 MG tablet Take 1 tablet (10 mg total) by mouth daily. 12/20/20   Gerlene Fee, NP  metFORMIN (GLUCOPHAGE) 500 MG tablet Take 1 tablet (500 mg total) by mouth 2 (two) times daily with a meal. 12/20/20   Nyoka Cowden, Phylis Bougie, NP  methocarbamol (ROBAXIN) 500 MG tablet Take 1 tablet (500 mg total) by mouth 3 (three) times daily. 12/20/20   Gerlene Fee, NP  mirtazapine (REMERON) 15 MG tablet Take 15 mg by mouth at bedtime. 01/01/21   [provider]  NON FORMULARY Dist Consistent Carbohydrate    [provider]  omeprazole (PRILOSEC) 20 MG capsule Take 20 mg by mouth daily.    [provider]  senna-docusate (SENOKOT-S) 8.6-50 MG tablet Take 2 tablets by mouth at bedtime. 12/04/20 12/04/21  Roxan Hockey, MD    Allergies    Codeine  Review of Systems   Review of Systems  All other systems reviewed and are negative.   Physical Exam Updated Vital Signs BP 129/84   Pulse 60   Temp 98.4 F (36.9 C) (Oral)   Resp 16   Ht '5\' 2"'$  (1.575 m)   Wt 74.8 kg   SpO2 99%   BMI 30.18 kg/m   Physical Exam Vitals reviewed.  HENT:     Head: Normocephalic.     Mouth/Throat:     Mouth: Mucous membranes are moist.  Cardiovascular:     Rate and Rhythm: Normal rate.     Pulses: Normal pulses.  Pulmonary:     Effort: Pulmonary effort is normal.  Abdominal:     General: Abdomen is flat.     Palpations: Abdomen is soft.  Musculoskeletal:        General: Tenderness present.     Cervical back: Normal range of motion.     Comments: Tender right shoulder.  Pain with movemetn   Skin:    General: Skin is warm.  Neurological:     Mental Status: She is alert and oriented to person, place, and time.     Comments: Pt refuses muscle testing.    Psychiatric:         Mood and Affect: Mood normal.     ED Results / Procedures / Treatments   Labs (all labs ordered are listed, but only abnormal results are displayed) Labs Reviewed - No data to display  EKG None  Radiology CT ABDOMEN PELVIS WO CONTRAST  Result Date: 01/26/2021 CLINICAL DATA:  Fall yesterday EXAM: CT CHEST, ABDOMEN AND PELVIS WITHOUT CONTRAST CT LUMBAR SPINE WITHOUT CONTRAST TECHNIQUE: Multidetector CT  imaging of the chest, abdomen and pelvis was performed following the standard protocol without IV contrast. Multi detector CT imaging of the lumbar spine was performed following the standard protocol without IV contrast, with dedicated lumbar spine reformats. COMPARISON:  Lumbar spine radiographs, 01/25/2021, MR lumbar spine, 12/02/2020 FINDINGS: CT CHEST FINDINGS Cardiovascular: Aortic atherosclerosis. Normal heart size. Left coronary artery calcifications. No pericardial effusion. Mediastinum/Nodes: No enlarged mediastinal, hilar, or axillary lymph nodes. Thyroid gland, trachea, and esophagus demonstrate no significant findings. Lungs/Pleura: Dependent bibasilar scarring or or atelectasis. No pleural effusion or pneumothorax. Musculoskeletal: No chest wall mass or suspicious bone lesions identified. CT ABDOMEN PELVIS FINDINGS Hepatobiliary: No solid liver abnormality is seen. No gallstones, gallbladder wall thickening, or biliary dilatation. Pancreas: Unremarkable. No pancreatic ductal dilatation or surrounding inflammatory changes. Spleen: Normal in size without significant abnormality. Adrenals/Urinary Tract: Adrenal glands are unremarkable. Mild bilateral hydronephrosis and hydroureter to the bilateral ureterovesicular junctions. Distended urinary bladder. Stomach/Bowel: Stomach is within normal limits. Appendix is not clearly visualized and may be surgically absent. No evidence of bowel wall thickening, distention, or inflammatory changes. Descending and sigmoid diverticulosis. Vascular/Lymphatic:  Aortic atherosclerosis. No enlarged abdominal or pelvic lymph nodes. Reproductive: Status post hysterectomy. Other: No abdominal wall hernia or abnormality. No abdominopelvic ascites. Musculoskeletal: No acute or significant osseous findings. CT LUMBAR SPINE FINDINGS Alignment: Normal. Vertebral bodies: Intact. Disc spaces: Moderate disc space height loss and osteophytosis at L5-S1. Disc spaces are otherwise intact. Probable small broad-based posterior disc bulges at L4-L5 and L5-S1. IMPRESSION: 1. No noncontrast CT evidence of acute traumatic injury to the chest, abdomen, or pelvis. 2. Distended urinary bladder. Mild bilateral hydronephrosis and hydroureter to the bilateral ureterovesicular junctions. No obstructing calculus or other etiology identified. Correlate for urinary retention. 3. No fracture or dislocation of the lumbar spine. Moderate disc space height loss and osteophytosis at L5-S1. Probable small broad-based posterior disc bulges at L4-L5 and L5-S1. 4. Coronary artery disease. Aortic Atherosclerosis (ICD10-I70.0). Electronically Signed   By: Eddie Candle M.D.   On: 01/26/2021 11:29   DG Chest 1 View  Result Date: 01/25/2021 CLINICAL DATA:  85 year old female with bilateral shoulder pain after fall. EXAM: CHEST  1 VIEW COMPARISON:  Chest radiograph dated 08/20/2019. FINDINGS: No focal consolidation, pleural effusion or pneumothorax. The cardiac silhouette is within limits. No acute osseous pathology. IMPRESSION: No active disease. Electronically Signed   By: Anner Crete M.D.   On: 01/25/2021 22:18   DG Thoracic Spine 2 View  Result Date: 01/25/2021 CLINICAL DATA:  Fall with tenderness EXAM: THORACIC SPINE 2 VIEWS COMPARISON:  None. FINDINGS: There is no evidence of thoracic spine fracture. Alignment is normal. Mild degenerative osteophytes. IMPRESSION: No acute osseous abnormality Electronically Signed   By: Donavan Foil M.D.   On: 01/25/2021 22:17   DG Lumbar Spine Complete  Result  Date: 01/25/2021 CLINICAL DATA:  Fall with tenderness EXAM: LUMBAR SPINE - COMPLETE 4+ VIEW COMPARISON:  MRI 12/02/2020 FINDINGS: Five lumbar type vertebra. Alignment within normal limits. Vertebral body heights are grossly maintained. Dense aortic atherosclerosis. Diffuse degenerative changes throughout the lumbar spine with moderate disc space narrowing at L5-S1. Facet degenerative changes at multiple levels. IMPRESSION: Degenerative changes.  No acute osseous abnormality. Electronically Signed   By: Donavan Foil M.D.   On: 01/25/2021 22:16   DG Pelvis 1-2 Views  Result Date: 01/25/2021 CLINICAL DATA:  Fall EXAM: PELVIS - 1-2 VIEW COMPARISON:  CT 12/02/2020, radiograph 03/26/2018 FINDINGS: SI joints are non widened. Pubic symphysis and rami appear intact. No  fracture or malalignment. IMPRESSION: No acute osseous abnormality Electronically Signed   By: Donavan Foil M.D.   On: 01/25/2021 22:14   DG Shoulder Right  Result Date: 01/25/2021 CLINICAL DATA:  Fall EXAM: RIGHT SHOULDER - 2+ VIEW COMPARISON:  11/19/2020 FINDINGS: Mild AC joint degenerative change. Mild glenohumeral degenerative change. No fracture or malalignment. IMPRESSION: No acute osseous abnormality Electronically Signed   By: Donavan Foil M.D.   On: 01/25/2021 22:20   DG Tibia/Fibula Left  Result Date: 01/25/2021 CLINICAL DATA:  Fall EXAM: LEFT TIBIA AND FIBULA - 2 VIEW COMPARISON:  12/02/2020 FINDINGS: No fracture or malalignment. Diffuse soft tissue edema. No radiopaque foreign body IMPRESSION: No acute osseous abnormality Electronically Signed   By: Donavan Foil M.D.   On: 01/25/2021 22:18   DG Ankle Complete Left  Result Date: 01/25/2021 CLINICAL DATA:  Fall EXAM: LEFT ANKLE COMPLETE - 3+ VIEW COMPARISON:  12/02/2020 FINDINGS: Generalized soft tissue swelling. No fracture or malalignment. Ankle mortise is symmetric. Degenerative changes medially and laterally. Moderate plantar calcaneal spur IMPRESSION: No acute osseous  abnormality Electronically Signed   By: Donavan Foil M.D.   On: 01/25/2021 22:19   CT Head Wo Contrast  Result Date: 01/25/2021 CLINICAL DATA:  85 year old female with facial trauma. EXAM: CT HEAD WITHOUT CONTRAST CT MAXILLOFACIAL WITHOUT CONTRAST CT CERVICAL SPINE WITHOUT CONTRAST TECHNIQUE: Multidetector CT imaging of the head, cervical spine, and maxillofacial structures were performed using the standard protocol without intravenous contrast. Multiplanar CT image reconstructions of the cervical spine and maxillofacial structures were also generated. COMPARISON:  Head CT dated 12/02/2020. FINDINGS: CT HEAD FINDINGS Brain: The ventricles and sulci are appropriate size for patient's age. The gray-white matter discrimination is preserved. There is no acute intracranial hemorrhage. No mass effect or midline shift. No extra-axial fluid collection. Vascular: No hyperdense vessel or unexpected calcification. Skull: Normal. Negative for fracture or focal lesion. Other: Scalp hematoma over the vertex. CT MAXILLOFACIAL FINDINGS Osseous: No acute fracture or mandibular subluxation Orbits: The globes and retro-orbital fat are preserved. Sinuses: Mild mucoperiosteal thickening of paranasal sinuses. Bilateral maxillary sinus retention cysts or polyps measure up to 3 cm on the left. There is concha bullosa, left greater right. Soft tissues: Negative. CT CERVICAL SPINE FINDINGS Alignment: No acute subluxation. Skull base and vertebrae: No acute fracture. Osteopenia. Soft tissues and spinal canal: No prevertebral fluid or swelling. No visible canal hematoma. Disc levels: Multilevel degenerative changes. There is disc space narrowing and endplate irregularity and osteophyte at C3-C4. C4-C6 ACDF. Upper chest: None Other: Bilateral carotid bulb calcified plaques. IMPRESSION: 1. No acute intracranial pathology. 2. No acute/traumatic cervical spine pathology. 3. No acute facial bone fractures. Electronically Signed   By: Anner Crete M.D.   On: 01/25/2021 21:37   CT Chest Wo Contrast  Result Date: 01/26/2021 CLINICAL DATA:  Fall yesterday EXAM: CT CHEST, ABDOMEN AND PELVIS WITHOUT CONTRAST CT LUMBAR SPINE WITHOUT CONTRAST TECHNIQUE: Multidetector CT imaging of the chest, abdomen and pelvis was performed following the standard protocol without IV contrast. Multi detector CT imaging of the lumbar spine was performed following the standard protocol without IV contrast, with dedicated lumbar spine reformats. COMPARISON:  Lumbar spine radiographs, 01/25/2021, MR lumbar spine, 12/02/2020 FINDINGS: CT CHEST FINDINGS Cardiovascular: Aortic atherosclerosis. Normal heart size. Left coronary artery calcifications. No pericardial effusion. Mediastinum/Nodes: No enlarged mediastinal, hilar, or axillary lymph nodes. Thyroid gland, trachea, and esophagus demonstrate no significant findings. Lungs/Pleura: Dependent bibasilar scarring or or atelectasis. No pleural effusion or pneumothorax. Musculoskeletal: No chest  wall mass or suspicious bone lesions identified. CT ABDOMEN PELVIS FINDINGS Hepatobiliary: No solid liver abnormality is seen. No gallstones, gallbladder wall thickening, or biliary dilatation. Pancreas: Unremarkable. No pancreatic ductal dilatation or surrounding inflammatory changes. Spleen: Normal in size without significant abnormality. Adrenals/Urinary Tract: Adrenal glands are unremarkable. Mild bilateral hydronephrosis and hydroureter to the bilateral ureterovesicular junctions. Distended urinary bladder. Stomach/Bowel: Stomach is within normal limits. Appendix is not clearly visualized and may be surgically absent. No evidence of bowel wall thickening, distention, or inflammatory changes. Descending and sigmoid diverticulosis. Vascular/Lymphatic: Aortic atherosclerosis. No enlarged abdominal or pelvic lymph nodes. Reproductive: Status post hysterectomy. Other: No abdominal wall hernia or abnormality. No abdominopelvic ascites.  Musculoskeletal: No acute or significant osseous findings. CT LUMBAR SPINE FINDINGS Alignment: Normal. Vertebral bodies: Intact. Disc spaces: Moderate disc space height loss and osteophytosis at L5-S1. Disc spaces are otherwise intact. Probable small broad-based posterior disc bulges at L4-L5 and L5-S1. IMPRESSION: 1. No noncontrast CT evidence of acute traumatic injury to the chest, abdomen, or pelvis. 2. Distended urinary bladder. Mild bilateral hydronephrosis and hydroureter to the bilateral ureterovesicular junctions. No obstructing calculus or other etiology identified. Correlate for urinary retention. 3. No fracture or dislocation of the lumbar spine. Moderate disc space height loss and osteophytosis at L5-S1. Probable small broad-based posterior disc bulges at L4-L5 and L5-S1. 4. Coronary artery disease. Aortic Atherosclerosis (ICD10-I70.0). Electronically Signed   By: Eddie Candle M.D.   On: 01/26/2021 11:29   CT Cervical Spine Wo Contrast  Result Date: 01/25/2021 CLINICAL DATA:  85 year old female with facial trauma. EXAM: CT HEAD WITHOUT CONTRAST CT MAXILLOFACIAL WITHOUT CONTRAST CT CERVICAL SPINE WITHOUT CONTRAST TECHNIQUE: Multidetector CT imaging of the head, cervical spine, and maxillofacial structures were performed using the standard protocol without intravenous contrast. Multiplanar CT image reconstructions of the cervical spine and maxillofacial structures were also generated. COMPARISON:  Head CT dated 12/02/2020. FINDINGS: CT HEAD FINDINGS Brain: The ventricles and sulci are appropriate size for patient's age. The gray-white matter discrimination is preserved. There is no acute intracranial hemorrhage. No mass effect or midline shift. No extra-axial fluid collection. Vascular: No hyperdense vessel or unexpected calcification. Skull: Normal. Negative for fracture or focal lesion. Other: Scalp hematoma over the vertex. CT MAXILLOFACIAL FINDINGS Osseous: No acute fracture or mandibular  subluxation Orbits: The globes and retro-orbital fat are preserved. Sinuses: Mild mucoperiosteal thickening of paranasal sinuses. Bilateral maxillary sinus retention cysts or polyps measure up to 3 cm on the left. There is concha bullosa, left greater right. Soft tissues: Negative. CT CERVICAL SPINE FINDINGS Alignment: No acute subluxation. Skull base and vertebrae: No acute fracture. Osteopenia. Soft tissues and spinal canal: No prevertebral fluid or swelling. No visible canal hematoma. Disc levels: Multilevel degenerative changes. There is disc space narrowing and endplate irregularity and osteophyte at C3-C4. C4-C6 ACDF. Upper chest: None Other: Bilateral carotid bulb calcified plaques. IMPRESSION: 1. No acute intracranial pathology. 2. No acute/traumatic cervical spine pathology. 3. No acute facial bone fractures. Electronically Signed   By: Anner Crete M.D.   On: 01/25/2021 21:37   CT Shoulder Right Wo Contrast  Result Date: 01/26/2021 CLINICAL DATA:  Right shoulder pain.  Status post fall EXAM: CT OF THE UPPER RIGHT EXTREMITY WITHOUT CONTRAST TECHNIQUE: Multidetector CT imaging of the upper right extremity was performed according to the standard protocol. COMPARISON:  None. FINDINGS: Bones/Joint/Cartilage No fracture or dislocation. Normal alignment. No joint effusion. Moderate arthropathy of the acromioclavicular joint. Mild glenohumeral joint space narrowing. Ligaments Ligaments are suboptimally evaluated by CT.  Muscles and Tendons Muscles are normal. No muscle atrophy. Mild mineralization in the infraspinatus tendon as can be seen with calcific tendinosis. Soft tissue No fluid collection or hematoma. No soft tissue mass. Visualized right lung is clear. IMPRESSION: 1.  No acute osseous injury of the right shoulder. 2. Mild mineralization in the infraspinatus tendon as can be seen with calcific tendinosis. Electronically Signed   By: Kathreen Devoid   On: 01/26/2021 11:36   CT L-SPINE NO  CHARGE  Result Date: 01/26/2021 CLINICAL DATA:  Fall yesterday EXAM: CT CHEST, ABDOMEN AND PELVIS WITHOUT CONTRAST CT LUMBAR SPINE WITHOUT CONTRAST TECHNIQUE: Multidetector CT imaging of the chest, abdomen and pelvis was performed following the standard protocol without IV contrast. Multi detector CT imaging of the lumbar spine was performed following the standard protocol without IV contrast, with dedicated lumbar spine reformats. COMPARISON:  Lumbar spine radiographs, 01/25/2021, MR lumbar spine, 12/02/2020 FINDINGS: CT CHEST FINDINGS Cardiovascular: Aortic atherosclerosis. Normal heart size. Left coronary artery calcifications. No pericardial effusion. Mediastinum/Nodes: No enlarged mediastinal, hilar, or axillary lymph nodes. Thyroid gland, trachea, and esophagus demonstrate no significant findings. Lungs/Pleura: Dependent bibasilar scarring or or atelectasis. No pleural effusion or pneumothorax. Musculoskeletal: No chest wall mass or suspicious bone lesions identified. CT ABDOMEN PELVIS FINDINGS Hepatobiliary: No solid liver abnormality is seen. No gallstones, gallbladder wall thickening, or biliary dilatation. Pancreas: Unremarkable. No pancreatic ductal dilatation or surrounding inflammatory changes. Spleen: Normal in size without significant abnormality. Adrenals/Urinary Tract: Adrenal glands are unremarkable. Mild bilateral hydronephrosis and hydroureter to the bilateral ureterovesicular junctions. Distended urinary bladder. Stomach/Bowel: Stomach is within normal limits. Appendix is not clearly visualized and may be surgically absent. No evidence of bowel wall thickening, distention, or inflammatory changes. Descending and sigmoid diverticulosis. Vascular/Lymphatic: Aortic atherosclerosis. No enlarged abdominal or pelvic lymph nodes. Reproductive: Status post hysterectomy. Other: No abdominal wall hernia or abnormality. No abdominopelvic ascites. Musculoskeletal: No acute or significant osseous findings.  CT LUMBAR SPINE FINDINGS Alignment: Normal. Vertebral bodies: Intact. Disc spaces: Moderate disc space height loss and osteophytosis at L5-S1. Disc spaces are otherwise intact. Probable small broad-based posterior disc bulges at L4-L5 and L5-S1. IMPRESSION: 1. No noncontrast CT evidence of acute traumatic injury to the chest, abdomen, or pelvis. 2. Distended urinary bladder. Mild bilateral hydronephrosis and hydroureter to the bilateral ureterovesicular junctions. No obstructing calculus or other etiology identified. Correlate for urinary retention. 3. No fracture or dislocation of the lumbar spine. Moderate disc space height loss and osteophytosis at L5-S1. Probable small broad-based posterior disc bulges at L4-L5 and L5-S1. 4. Coronary artery disease. Aortic Atherosclerosis (ICD10-I70.0). Electronically Signed   By: Eddie Candle M.D.   On: 01/26/2021 11:29   DG Shoulder Left  Result Date: 01/25/2021 CLINICAL DATA:  Fall EXAM: LEFT SHOULDER - 2+ VIEW COMPARISON:  None. FINDINGS: Mild AC joint degenerative change. Mild glenohumeral degenerative change. No fracture or malalignment. IMPRESSION: No acute osseous abnormality. Electronically Signed   By: Donavan Foil M.D.   On: 01/25/2021 22:22   CT Maxillofacial Wo Contrast  Result Date: 01/25/2021 CLINICAL DATA:  85 year old female with facial trauma. EXAM: CT HEAD WITHOUT CONTRAST CT MAXILLOFACIAL WITHOUT CONTRAST CT CERVICAL SPINE WITHOUT CONTRAST TECHNIQUE: Multidetector CT imaging of the head, cervical spine, and maxillofacial structures were performed using the standard protocol without intravenous contrast. Multiplanar CT image reconstructions of the cervical spine and maxillofacial structures were also generated. COMPARISON:  Head CT dated 12/02/2020. FINDINGS: CT HEAD FINDINGS Brain: The ventricles and sulci are appropriate size for patient's age. The gray-white  matter discrimination is preserved. There is no acute intracranial hemorrhage. No mass  effect or midline shift. No extra-axial fluid collection. Vascular: No hyperdense vessel or unexpected calcification. Skull: Normal. Negative for fracture or focal lesion. Other: Scalp hematoma over the vertex. CT MAXILLOFACIAL FINDINGS Osseous: No acute fracture or mandibular subluxation Orbits: The globes and retro-orbital fat are preserved. Sinuses: Mild mucoperiosteal thickening of paranasal sinuses. Bilateral maxillary sinus retention cysts or polyps measure up to 3 cm on the left. There is concha bullosa, left greater right. Soft tissues: Negative. CT CERVICAL SPINE FINDINGS Alignment: No acute subluxation. Skull base and vertebrae: No acute fracture. Osteopenia. Soft tissues and spinal canal: No prevertebral fluid or swelling. No visible canal hematoma. Disc levels: Multilevel degenerative changes. There is disc space narrowing and endplate irregularity and osteophyte at C3-C4. C4-C6 ACDF. Upper chest: None Other: Bilateral carotid bulb calcified plaques. IMPRESSION: 1. No acute intracranial pathology. 2. No acute/traumatic cervical spine pathology. 3. No acute facial bone fractures. Electronically Signed   By: Anner Crete M.D.   On: 01/25/2021 21:37    Procedures Procedures   Medications Ordered in ED Medications  oxyCODONE-acetaminophen (PERCOCET/ROXICET) 5-325 MG per tablet 2 tablet (2 tablets Oral Given 01/26/21 0945)    ED Course  I have reviewed the triage vital signs and the nursing notes.  Pertinent labs & imaging results that were available during my care of the patient were reviewed by me and considered in my medical decision making (see chart for details).    MDM Rules/Calculators/A&P                          xrays from yesterday reviewed.  Ct chest/abdomen and ls spine.  No acute abnormality.  Pt reexamined with Dr. Laverta Baltimore.  Pt will not move extremities when asked.  Pt moves arms when she is talking and distracted.   socail work consult ot and pt consult  4:23pm   Reevaluated.  Pt resist against movement. Pt moves head side to side and moves arms while talking   Final Clinical Impression(s) / ED Diagnoses Final diagnoses:  Fall  Fall, subsequent encounter  Facial laceration, subsequent encounter  Weakness    Rx / DC Orders ED Discharge Orders    None       Sidney Ace 01/26/21 1625    Margette Fast, MD 01/27/21 [id number]

## 2021-01-26 NOTE — ED Triage Notes (Signed)
Pt to er, per ems pt was seen yesterday at [facility] and left [facility] with a script for pain med that was not filled at this time because the pharmacy isn't open yet.  Pt states that she has arm and knee pain, states that she fell yesterday and still hurts.

## 2021-01-26 NOTE — ED Notes (Signed)
Gave pt additional pillow . Adjusted pt up in bed

## 2021-01-26 NOTE — ED Notes (Signed)
PTAR called  

## 2021-01-26 NOTE — ED Notes (Signed)
Ortho tech paged at this time

## 2021-01-26 NOTE — Progress Notes (Signed)
Orthopedic Tech Progress Note Patient Details:  [name] 04-30-1936 [id number]  Ortho Devices Type of Ortho Device: Arm sling Ortho Device/Splint Location: rue Ortho Device/Splint Interventions: Ordered,Application,Adjustment   Post Interventions Patient Tolerated: Well Instructions Provided: Care of device,Adjustment of device   Karolee Stamps 01/26/2021, 2:33 AM

## 2021-01-26 NOTE — ED Notes (Signed)
Patient at this time still stating that she cannot move legs or bear weight on feet. Patient seen moving legs in bed, ED physician at bedside to discuss patient's options for transport home and to reiterate that patient's scans were within acceptable limits.

## 2021-01-26 NOTE — ED Notes (Signed)
Ortho tech at bedside 

## 2021-01-26 NOTE — ED Notes (Signed)
This RN at bedside at this time to place IV. Patient seen with left leg placed over bed rail, patient asked to move leg back onto bed so rail could be let down for IV placement, patient complied. After placement patient seen moving both legs and arms in attempt to shift self in bed. This RN assisted patient into more comfortable position.

## 2021-01-27 DIAGNOSIS — E1122 Type 2 diabetes mellitus with diabetic chronic kidney disease: Secondary | ICD-10-CM | POA: Diagnosis not present

## 2021-01-27 DIAGNOSIS — E7849 Other hyperlipidemia: Secondary | ICD-10-CM | POA: Diagnosis not present

## 2021-01-27 DIAGNOSIS — N182 Chronic kidney disease, stage 2 (mild): Secondary | ICD-10-CM | POA: Diagnosis not present

## 2021-01-27 DIAGNOSIS — I129 Hypertensive chronic kidney disease with stage 1 through stage 4 chronic kidney disease, or unspecified chronic kidney disease: Secondary | ICD-10-CM | POA: Diagnosis not present

## 2021-01-27 NOTE — NC FL2 (Signed)
[place] MEDICAID FL2 LEVEL OF CARE SCREENING TOOL     IDENTIFICATION  Patient Name: [name] Birthdate: 21-Apr-1936 Sex: female Admission Date (Current Location): 01/26/2021  [facility] and [place] Number:  [employer] and Address:  [place] [address] [place]      Provider Number: [id number]  Attending Physician Name and Address:  Default, Provider, MD  Relative Name and Phone Number:  Charlcie Cradle (Daughter)   [phone]    Current Level of Care: Hospital Recommended Level of Care: [place] Prior Approval Number:    Date Approved/Denied:   PASRR Number: [id number]  Discharge Plan: SNF    Current Diagnoses: Patient Active Problem List   Diagnosis Date Noted  . Closed fracture of proximal end of left fibula with routine healing 12/19/2020  . CKD (chronic kidney disease), stage III ([place]) 12/11/2020  . Hyperlipidemia associated with type 2 diabetes mellitus ([place]) 12/05/2020  . Type 2 diabetes mellitus with diabetic neuropathy, unspecified ([place]) 12/05/2020  . GERD without esophagitis 12/05/2020  . Chronic constipation 12/05/2020  . Lumbar radicular syndrome 12/05/2020  . Diabetic peripheral neuropathy ([place]) 12/05/2020  . Avascular necrosis of bones of both hips ([place]) 12/05/2020  . Uncontrolled pain 12/03/2020  . Leg pain 12/02/2020  . Left hip pain 12/02/2020  . Fracture of fibula alone 12/02/2020  . Inability to bear weight 12/02/2020  . High cholesterol   . Neuropathy   . Hypertension associated with diabetes ([place])   . Hyperglycemia   . AKI (acute kidney injury) ([place])   . Normocytic anemia   . Bilateral sensorineural hearing loss 05/02/2020  . Cervical spondylosis with myelopathy 01/27/2019  . Degeneration of lumbar intervertebral disc 12/12/2018  . Cervical spine pain 11/07/2018    Orientation RESPIRATION BLADDER Height & Weight     Self,Time,Situation,Place  Normal  Continent,External catheter Weight: 165 lb (74.8 kg) Height:  '5\' 2"'$  (157.5 cm)  BEHAVIORAL SYMPTOMS/MOOD NEUROLOGICAL BOWEL NUTRITION STATUS      Continent Diet (Regular)  AMBULATORY STATUS COMMUNICATION OF NEEDS Skin   Extensive Assist Verbally Normal,Other (Comment) (Laceration with stitches from recent fall.)                       Personal Care Assistance Level of Assistance  Bathing,Feeding,Dressing,Total care Bathing Assistance: Maximum assistance Feeding assistance: Limited assistance Dressing Assistance: Maximum assistance Total Care Assistance: Maximum assistance   Functional Limitations Info  Sight,Hearing,Speech Sight Info: Adequate Hearing Info: Adequate Speech Info: Adequate    SPECIAL CARE FACTORS FREQUENCY  PT (By licensed PT),OT (By licensed OT)     PT Frequency: 5 times weekly OT Frequency: 5 times weekly            Contractures Contractures Info: Not present    Additional Factors Info  Code Status,Allergies Code Status Info: FULL Allergies Info: Codeine           Current Medications (01/27/2021):  This is the current hospital active medication list Current Facility-Administered Medications  Medication Dose Route Frequency Provider Last Rate Last Admin  . atorvastatin (LIPITOR) tablet 20 mg  20 mg Oral QPM Fransico Meadow, PA-C   20 mg at 01/26/21 1900  . gabapentin (NEURONTIN) capsule 300 mg  300 mg Oral BID Sofia, Leslie K, PA-C   300 mg at 01/27/21 1006  . HYDROcodone-acetaminophen (NORCO/VICODIN) 5-325 MG per tablet 1 tablet  1 tablet Oral Q6H PRN Fransico Meadow, PA-C   1 tablet at 01/27/21  1657  . lisinopril (ZESTRIL) tablet 10 mg  10 mg Oral Daily Fransico Meadow, PA-C   10 mg at 01/27/21 1006  . metFORMIN (GLUCOPHAGE) tablet 500 mg  500 mg Oral BID WC Fransico Meadow, PA-C   500 mg at 01/27/21 1657  . methocarbamol (ROBAXIN) tablet 500 mg  500 mg Oral TID Sofia, Leslie K, PA-C   500 mg at 01/27/21 1657  . mirtazapine (REMERON) tablet 15  mg  15 mg Oral QHS Fransico Meadow, [place]   15 mg at 01/26/21 2119  . pantoprazole (PROTONIX) EC tablet 40 mg  40 mg Oral Daily Fransico Meadow, [place]   40 mg at 01/27/21 1006   Current Outpatient Medications  Medication Sig Dispense Refill  . acetaminophen (TYLENOL) 325 MG tablet Take 2 tablets (650 mg total) by mouth every 6 (six) hours. 30 tablet   . albuterol (VENTOLIN HFA) 108 (90 Base) MCG/ACT inhaler Inhale 1 puff into the lungs every 4 (four) hours as needed for wheezing or shortness of breath.    Marland Kitchen atorvastatin (LIPITOR) 20 MG tablet Take 1 tablet (20 mg total) by mouth every evening. 30 tablet 0  . chlorthalidone (HYGROTON) 25 MG tablet Take 25 mg by mouth daily.    Marland Kitchen gabapentin (NEURONTIN) 300 MG capsule Take 1 capsule (300 mg total) by mouth 2 (two) times daily. 60 capsule 0  . LANTUS SOLOSTAR 100 UNIT/ML Solostar Pen Inject 30 Units into the skin daily. 15 mL 0  . metFORMIN (GLUCOPHAGE) 500 MG tablet Take 1 tablet (500 mg total) by mouth 2 (two) times daily with a meal. 60 tablet 0  . mirtazapine (REMERON) 15 MG tablet Take 15 mg by mouth at bedtime.    . pantoprazole (PROTONIX) 40 MG tablet Take 40 mg by mouth daily.    . bisacodyl (DULCOLAX) 10 MG suppository Place 1 suppository (10 mg total) rectally daily as needed for moderate constipation. (Patient not taking: No sig reported) 12 suppository 0  . HYDROcodone-acetaminophen (NORCO/VICODIN) 5-325 MG tablet Take 1 tablet by mouth every 6 (six) hours as needed for severe pain. (Patient not taking: No sig reported) 10 tablet 0  . lisinopril (ZESTRIL) 10 MG tablet Take 1 tablet (10 mg total) by mouth daily. (Patient not taking: No sig reported) 30 tablet 0  . methocarbamol (ROBAXIN) 500 MG tablet Take 1 tablet (500 mg total) by mouth 3 (three) times daily. (Patient not taking: Reported on 01/26/2021) 90 tablet 0  . NON FORMULARY Dist Consistent Carbohydrate    . omeprazole (PRILOSEC) 20 MG capsule Take 20 mg by mouth daily. (Patient not  taking: No sig reported)    . senna-docusate (SENOKOT-S) 8.6-50 MG tablet Take 2 tablets by mouth at bedtime. (Patient not taking: No sig reported) 60 tablet 1     Discharge Medications: Please see discharge summary for a list of discharge medications.  Relevant Imaging Results:  Relevant Lab Results:   Additional Information SSN: [id number]. Daughter reports pt has had first two COVID vaccines and believes she has had booster as well.  Iona Beard, LCSWA

## 2021-01-27 NOTE — TOC Progression Note (Signed)
Transition of Care [facility]) - Progression Note    Patient Details  Name: [name] MRN: [id number] Date of Birth: 04-Dec-1935  [facility]) CM/SW [place], [place] Phone Number: [phone] 01/27/2021, 9:05 AM  Clinical Narrative:     Pending PT/OT evaluation for potential SNF placement.  Patient states she is unable to care for herself and her children are unable to assist w/ her care.  Patient stated she was in SNF placement in April.  Weekend CSW explained to patient about SNF costs and co-pay days.  TOC will continue to follow.       Expected Discharge Plan and Services                                                 Social Determinants of Health (SDOH) Interventions    Readmission Risk Interventions Readmission Risk Prevention Plan 12/04/2020  Medication Screening Complete  Transportation Screening Complete  Some recent data might be hidden

## 2021-01-27 NOTE — ED Provider Notes (Signed)
Pt holding for SNF placement.  Pt states she has ate today.  Pt reports she is feeling some better.  Pt reports more use of her arms.  Pt reports both legs are still weak.   Vitals stable  Normal respirations heart normal rate.   Increased movement of arms.  Pt moves toes,   Pt saw pt today. Social work involved    Sidney Ace 01/27/21 1809    Hayden Rasmussen, MD 01/28/21 1045

## 2021-01-27 NOTE — Evaluation (Signed)
Physical Therapy Evaluation Patient Details Name: [name] MRN: [id number] DOB: 06/24/1936 Today's Date: 01/27/2021   History of Present Illness  [name] is a 85 y.o. female. Patient present with daughter who stated patient fell down 5 steps yesterday. Pt was [facility] ED.  Family called EMS today.  Pt complains of pain.  Pt did not get rx filled. Family member reports pt could not walk today.  Pt reports she can not move she hurts so much.  Pt complains of pain in right shoulder and left knee, and pelvic pain.  Pt his her head and had sutures placed yesterday.  Pt lives a lone.  Family member states she can not care for pt.    Clinical Impression  Patient limited for functional mobility as stated below secondary to BLE weakness, fatigue and pain. Patient attempting to roll bilaterally with use of bed rails but is limited by pain despite assist provided. Attempted to have patient pull to seated EOB with hand held assist but patient unable to secondary to poor bilateral UE strength and grip strength. Patient will benefit from continued physical therapy in hospital and recommended venue below to increase strength, balance, endurance for safe ADLs and gait.     Follow Up Recommendations SNF    Equipment Recommendations  None recommended by PT    Recommendations for Other Services       Precautions / Restrictions Precautions Precautions: Fall      Mobility  Bed Mobility Overal bed mobility: Needs Assistance Bed Mobility: Rolling Rolling: Max assist         General bed mobility comments: labored, c/o pain, uses bed rails, attempt to pull to seated with therapist but patient with poor grip strength and is unable to utilize HHA    Transfers                    Ambulation/Gait                Stairs            Wheelchair Mobility    Modified Rankin (Stroke Patients Only)       Balance                                              Pertinent Vitals/Pain Pain Assessment: Faces Faces Pain Scale: Hurts whole lot Pain Location: bilateral shoulders, L knee, pelvis Pain Descriptors / Indicators: Sharp;Moaning;Grimacing;Guarding;Crying Pain Intervention(s): Limited activity within patient's tolerance;Monitored during session;Repositioned    Home Living Family/patient expects to be discharged to:: Private residence Living Arrangements: Alone Available Help at Discharge: Family;Available PRN/intermittently Type of Home: Apartment Home Access: Level entry     Home Layout: One level Home Equipment: Walker - 2 wheels;Shower seat;Grab bars - toilet;Grab bars - tub/shower      Prior Function Level of Independence: Independent with assistive device(s)         Comments: HH ambulator with RW     Hand Dominance        Extremity/Trunk Assessment   Upper Extremity Assessment Upper Extremity Assessment: Defer to OT evaluation    Lower Extremity Assessment Lower Extremity Assessment: Generalized weakness       Communication   Communication: No difficulties  Cognition Arousal/Alertness: Awake/alert Behavior During Therapy: WFL for tasks assessed/performed Overall Cognitive Status: Within Functional Limits for tasks assessed  General Comments      Exercises     Assessment/Plan    PT Assessment Patient needs continued PT services  PT Problem List Decreased strength;Decreased activity tolerance;Decreased balance;Decreased range of motion;Decreased mobility;Pain       PT Treatment Interventions DME instruction;Gait training;Stair training;Functional mobility training;Therapeutic activities;Therapeutic exercise;Balance training;Neuromuscular re-education;Patient/family education    PT Goals (Current goals can be found in the Care Plan section)  Acute Rehab PT Goals Patient Stated Goal: decrease pain PT Goal Formulation: With  patient Time For Goal Achievement: 02/10/21 Potential to Achieve Goals: Fair    Frequency Min 2X/week   Barriers to discharge        Co-evaluation               AM-PAC PT "6 Clicks" Mobility  Outcome Measure Help needed turning from your back to your side while in a flat bed without using bedrails?: A Lot Help needed moving from lying on your back to sitting on the side of a flat bed without using bedrails?: A Lot Help needed moving to and from a bed to a chair (including a wheelchair)?: Total Help needed standing up from a chair using your arms (e.g., wheelchair or bedside chair)?: Total Help needed to walk in hospital room?: Total Help needed climbing 3-5 steps with a railing? : Total 6 Click Score: 8    End of Session   Activity Tolerance: Patient limited by pain Patient left: in bed;with family/visitor present Nurse Communication: Mobility status;Patient requests pain meds PT Visit Diagnosis: Unsteadiness on feet (R26.81);Other abnormalities of gait and mobility (R26.89);Muscle weakness (generalized) (M62.81);Pain Pain - Right/Left: Left (bilateral) Pain - part of body: Shoulder;Hip;Knee    Time: [id number] PT Time Calculation (min) (ACUTE ONLY): 11 min   Charges:   PT Evaluation $PT Eval Low Complexity: 1 Low          9:32 AM, 01/27/21 Mearl Latin PT, DPT Physical Therapist at [facility]

## 2021-01-27 NOTE — Plan of Care (Signed)
  Problem: Acute Rehab PT Goals(only PT should resolve) Goal: Pt Will Go Supine/Side To Sit Outcome: Progressing Flowsheets (Taken 01/27/2021 0936) Pt will go Supine/Side to Sit: with moderate assist Goal: Pt Will Go Sit To Supine/Side Outcome: Progressing Flowsheets (Taken 01/27/2021 0936) Pt will go Sit to Supine/Side: with moderate assist Goal: Patient Will Perform Sitting Balance Outcome: Progressing Flowsheets (Taken 01/27/2021 0936) Patient will perform sitting balance: with minimal assist Goal: Pt Will Transfer Bed To Chair/Chair To Bed Outcome: Progressing Flowsheets (Taken 01/27/2021 0936) Pt will Transfer Bed to Chair/Chair to Bed: with mod assist  9:38 AM, 01/27/21 Mearl Latin PT, DPT Physical Therapist at [facility]

## 2021-01-27 NOTE — ED Notes (Signed)
Pt in bed with eyes closed, pt arouses to verbal stim, pt states that she has 8/10 leg pain and would like a muscle relaxer, pt then returned to pre aroused state.

## 2021-01-27 NOTE — TOC Progression Note (Signed)
CSW completed Fl2 for pt. Pts referral has been sent out to local facilities. TOC to reach out to [facility] tomorrow to find out how many days pt has before her copay days start. TOC to follow.

## 2021-01-28 ENCOUNTER — Telehealth: Payer: Self-pay | Admitting: Orthopedic Surgery

## 2021-01-28 NOTE — ED Notes (Signed)
Family at bedside, upset pt is in the hallway, upset patient is not being treated corrected and that's why they left .  Family is concerned that she will not be cleaned and will not have her needs meet.  Patient complaining that yesterday no one would come check on her when she was asking for help.  Pt is unable to feed herself, daughter is bedside assisting patient with movement of her arms and legs pt unable to provide ADLs.

## 2021-01-28 NOTE — Plan of Care (Signed)
  Problem: Acute Rehab OT Goals (only OT should resolve) Goal: Pt. Will Perform Eating Flowsheets (Taken 01/28/2021 0958) Pt Will Perform Eating:  with min assist  bed level Goal: Pt. Will Perform Grooming Flowsheets (Taken 01/28/2021 0958) Pt Will Perform Grooming:  sitting  with min assist  with min guard assist  bed level Goal: Pt. Will Perform Upper Body Dressing Flowsheets (Taken 01/28/2021 0958) Pt Will Perform Upper Body Dressing:  with min assist  with min guard assist  sitting Goal: Pt. Will Perform Lower Body Dressing Flowsheets (Taken 01/28/2021 0958) Pt Will Perform Lower Body Dressing:  with mod assist  bed level  sitting/lateral leans  with adaptive equipment Goal: Pt. Will Transfer To Toilet Flowsheets (Taken 01/28/2021 [id number]) Pt Will Transfer to Toilet:  with mod assist  stand pivot transfer  bedside commode  grab bars Goal: Pt/Caregiver Will Perform Home Exercise Program Flowsheets (Taken 01/28/2021 [id number]) Pt/caregiver will Perform Home Exercise Program:  Increased ROM  Both right and left upper extremity  Increased strength  With minimal assist  With Supervision  Tawonna Esquer OT, MOT

## 2021-01-28 NOTE — TOC Progression Note (Addendum)
CSW spoke with pts daughter [name] requesting an update for what the family plans to do as pt is ready for discharge. She states that she would like a hospital bed, bedside table, and wheelchair for pt. CSW stated TOC will work on getting the orders in for the needed equipment and can get [facility] services set up for pt. CSW explained a second time that [facility] services are not long term and they are not care givers. [name] states the family works and she does not know what they are going to do. CSW explained they can set up private pay services if they are interested in a caregiver but the hospital cannot set up those services. CSW requested update as soon as possible and again explained that pt is ready for d/c and cannot continue to stay in ED. TOC to follow.  Addendum 7:39pm: Pts daughter [name] updated CSW that she plans to have pt come to her home. The address is [place], [place] [address]. She is requesting the DME be delivered after 6pm tomorrow as no one will be in the home before then. She is requesting we use [facility] if they will come to [place]. TOC to make referral for DME and [facility] tomorrow.

## 2021-01-28 NOTE — Progress Notes (Addendum)
Physical Therapy Treatment Patient Details Name: [name] MRN: [id number] DOB: 1936/02/05 Today's Date: 01/28/2021    History of Present Illness [name] is a 85 y.o. female. Patient present with daughter who stated patient fell down 5 steps yesterday. Pt was seen at [facility] ED.  Family called EMS today.  Pt complains of pain.  Pt did not get rx filled. Family member reports pt could not walk today.  Pt reports she can not move she hurts so much.  Pt complains of pain in right shoulder and left knee, and pelvic pain.  Pt his her head and had sutures placed yesterday.  Pt lives a lone.  Family member states she can not care for pt.    PT Comments    Patient presented asleep in bed, patient movement was slow and labored with mod-max assist to go from supine to sit. Patient was able to sit at EOB with tactile and verbal cues to stay upright while preforming lower extremity exercises: ankle pumps, long are quads and seated march. Patient was assisted in returning to bed and required mod assist for bed mobility to reposition. Patient was left in bed - RN notified. Patient will benefit from continued physical therapy in hospital and recommended venue below to increase strength, balance, endurance for safe ADLs and gait.    Follow Up Recommendations  SNF     Equipment Recommendations       Recommendations for Other Services       Precautions / Restrictions Precautions Precautions: Fall    Mobility  Bed Mobility Overal bed mobility: Needs Assistance Bed Mobility: Supine to Sit;Sit to Supine Rolling: Mod assist;Max assist   Supine to sit: Mod assist;Max assist Sit to supine: Mod assist;Max assist   General bed mobility comments: slow labored movement; moaning in pan; limited UE functional use.    Transfers                    Ambulation/Gait                 Stairs             Wheelchair Mobility    Modified Rankin (Stroke Patients Only)        Balance Overall balance assessment: Needs assistance Sitting-balance support: Feet unsupported Sitting balance-Leahy Scale: Poor Sitting balance - Comments: EOB with posterior lean (Patient required tactile and verbal cues to stay upright)                                    Cognition Arousal/Alertness: Awake/alert Behavior During Therapy: WFL for tasks assessed/performed Overall Cognitive Status: Within Functional Limits for tasks assessed                                        Exercises General Exercises - Lower Extremity Ankle Circles/Pumps: AROM;Strengthening;Both;15 reps;Seated Long Arc Quad: AROM;Strengthening;Both;15 reps;Seated Hip Flexion/Marching: AROM;Strengthening;Both;15 reps;Seated    General Comments        Pertinent Vitals/Pain Pain Assessment: Faces Pain Score: 7  Pain Location: bilateral shoulders, L knee, pelvis Pain Descriptors / Indicators: Sharp;Moaning;Grimacing;Guarding Pain Intervention(s): Limited activity within patient's tolerance    Home Living                      Prior Function  PT Goals (current goals can now be found in the care plan section) Acute Rehab PT Goals Patient Stated Goal: decrease pain PT Goal Formulation: With patient Time For Goal Achievement: 02/10/21 Potential to Achieve Goals: Fair    Frequency    Min 2X/week      PT Plan      Co-evaluation              AM-PAC PT "6 Clicks" Mobility   Outcome Measure  Help needed turning from your back to your side while in a flat bed without using bedrails?: A Lot Help needed moving from lying on your back to sitting on the side of a flat bed without using bedrails?: A Lot Help needed moving to and from a bed to a chair (including a wheelchair)?: Total Help needed standing up from a chair using your arms (e.g., wheelchair or bedside chair)?: Total Help needed to walk in hospital room?: Total Help needed  climbing 3-5 steps with a railing? : Total 6 Click Score: 8    End of Session   Activity Tolerance: Patient limited by pain Patient left: in bed Nurse Communication: Mobility status PT Visit Diagnosis: Unsteadiness on feet (R26.81);Other abnormalities of gait and mobility (R26.89);Muscle weakness (generalized) (M62.81);Pain Pain - Right/Left: Right Pain - part of body: Shoulder;Hip;Knee     Time: [id number] PT Time Calculation (min) (ACUTE ONLY): 18 min  Charges:  $Therapeutic Activity: 8-22 mins                    4:04 PM, 01/28/21 Jeneen Rinks Cousler SPT

## 2021-01-28 NOTE — Telephone Encounter (Signed)
Patient's daughter Peter Congo, one of her designated contacts, [phone], called to relay that patient had another fall over the weekend, was seen at [facility] Emergency room. States patient was not satisfied with the visit and has since gone on to [facility] emergency room, as she said "cannot walk."   Daughter Peter Congo is asking for an appointment with Dr Aline Brochure, however, due to care still in progres, advised to await instructions from emergency room provider in order for [place] to appropriately schedule. Patient is aware of appointment on 02/03/21 for other medical issue for which she is also treating with Dr Aline Brochure, and aware we would schedule a separate appointment for new problem.

## 2021-01-28 NOTE — ED Notes (Signed)
Pt given a complete bed bath, and linen and gown changed.  Pt cleansed, deodorant applied.  Will continue to monitor.

## 2021-01-28 NOTE — ED Provider Notes (Signed)
Patient in no acute distress.  Still complaining of back or pelvic pain.  Patient's been evaluated by Occupational Therapy.  Do not see that she has been evaluated by physical therapy yet.  Social work is working on Clinical cytogeneticist.  Patient and patient's daughter were concerned about her pelvic pain.  I informed them that she has had an MRI done on April 4 and had CT chest abdomen pelvis and back done on May 29.  Both of them showed a lot of degenerative changes in the low back.  Nothing that showed any evidence of acute fracture or collapse of vertebrae.  Or any significant disc herniation.  But certainly could explain lots of reasons for pain.  Specifically the abdomen and pelvis had no acute findings internally.     Fredia Sorrow, MD 01/28/21 1150

## 2021-01-28 NOTE — ED Notes (Signed)
Pt sleeping awaiting bed placement to a SNF

## 2021-01-28 NOTE — ED Notes (Signed)
Provider at pts bedside to discuss test, CT and MRI findings over the last visits.  Pt and daughter discussed pts plans and next steps.

## 2021-01-28 NOTE — ED Notes (Signed)
Pt sleeping in [facility]

## 2021-01-28 NOTE — Evaluation (Signed)
Occupational Therapy Evaluation Patient Details Name: [name] MRN: [id number] DOB: 11-Mar-1936 Today's Date: 01/28/2021    History of Present Illness [name] is a 85 y.o. female. Patient present with daughter who stated patient fell down 5 steps yesterday. Pt was seen at [facility] ED.  Family called EMS today.  Pt complains of pain.  Pt did not get rx filled. Family member reports pt could not walk today.  Pt reports she can not move she hurts so much.  Pt complains of pain in right shoulder and left knee, and pelvic pain.  Pt his her head and had sutures placed yesterday.  Pt lives a lone.  Family member states she can not care for pt.   Clinical Impression   Pt agreeable to OT evaluation this date. Pt presents with significant pain in R shoulder, pelvic area, and L knee. Pt required maximal assist for supine to sit and sit to supine bed mobility. Pt demonstrates poor postrural stability with posterior lean. Pt demonstrates limited UE functional use with 2-/5 grip strength and limited A/ROM in B UE for shoulder flexion. Pt will benefit from continued OT in the hospital and recommended venue below to increase strength, balance, ROM and endurance for safe ADL's.      Follow Up Recommendations  SNF    Equipment Recommendations  None recommended by OT           Precautions / Restrictions Precautions Precautions: Fall      Mobility Bed Mobility Overal bed mobility: Needs Assistance Bed Mobility: Supine to Sit     Supine to sit: Max assist  Sit to supine: Max assist.    General bed mobility comments: slow labored movement; moaning in pan; limited UE functional use.    Transfers                      Balance Overall balance assessment: Needs assistance Sitting-balance support: Feet unsupported Sitting balance-Leahy Scale: Poor Sitting balance - Comments: EOB with posterior lean                                   ADL either performed or  assessed with clinical judgement   ADL Overall ADL's : Needs assistance/impaired                                       General ADL Comments: Per level of assisit needed for bed mobility pt likely required assist for ADL's as well.     Vision Baseline Vision/History: No visual deficits Patient Visual Report: No change from baseline                  Pertinent Vitals/Pain Pain Assessment: Faces Faces Pain Scale: Hurts whole lot Pain Location: bilateral shoulders, L knee, pelvis Pain Descriptors / Indicators: Sharp;Moaning;Grimacing;Guarding;Crying Pain Intervention(s): Limited activity within patient's tolerance;Monitored during session;Repositioned     Hand Dominance Right   Extremity/Trunk Assessment Upper Extremity Assessment Upper Extremity Assessment: RUE deficits/detail;LUE deficits/detail RUE Deficits / Details: ~145* shoulder flexion P/ROM, ~20% of expected A/ROM. 2-/5 grip strength. RUE Coordination: decreased fine motor;decreased gross motor LUE Deficits / Details: shoulder flexion P/ROM ~145*, A/ROM ~10* degrees. Limited supinatioin to neutral or slightly past neutral. 2-/5 grip strength. Decreased sensation to light touch reported on L upper arm. LUE Sensation: decreased light touch  LUE Coordination: decreased fine motor;decreased gross motor   Lower Extremity Assessment Lower Extremity Assessment: Defer to PT evaluation       Communication Communication Communication: No difficulties   Cognition Arousal/Alertness: Awake/alert Behavior During Therapy: WFL for tasks assessed/performed Overall Cognitive Status: Within Functional Limits for tasks assessed                                                      Home Living Family/patient expects to be discharged to:: Private residence Living Arrangements: Alone Available Help at Discharge: Family;Available PRN/intermittently Type of Home: Apartment Home Access: Level  entry     Home Layout: One level     Bathroom Shower/Tub: Teacher, early years/pre: Standard Bathroom Accessibility: Yes   Home Equipment: Walker - 2 wheels;Shower seat;Grab bars - toilet;Grab bars - tub/shower          Prior Functioning/Environment Level of Independence: Independent with assistive device(s)        Comments: HH ambulator with RW        OT Problem List: Decreased strength;Decreased range of motion;Decreased activity tolerance;Impaired balance (sitting and/or standing);Decreased coordination;Impaired sensation;Impaired UE functional use;Pain      OT Treatment/Interventions: Self-care/ADL training;Therapeutic exercise;DME and/or AE instruction;Manual therapy;Therapeutic activities;Balance training;Patient/family education    OT Goals(Current goals can be found in the care plan section) Acute Rehab OT Goals Patient Stated Goal: decrease pain OT Goal Formulation: With patient Time For Goal Achievement: 02/11/21 Potential to Achieve Goals: Fair  OT Frequency: Min 2X/week       End of Session    Activity Tolerance: Patient limited by pain Patient left: in bed;with nursing/sitter in room  OT Visit Diagnosis: Unsteadiness on feet (R26.81);History of falling (Z91.81);Muscle weakness (generalized) (M62.81);Other abnormalities of gait and mobility (R26.89);Pain Pain - Right/Left: Left Pain - part of body: Knee (Pelvic and R shoulder pain as well.)                Time: [id number] OT Time Calculation (min): 14 min Charges:  OT General Charges $OT Visit: 1 Visit OT Evaluation $OT Eval Low Complexity: 1 Low  Eeshan Verbrugge OT, MOT   Larey Seat 01/28/2021, 9:54 AM

## 2021-01-28 NOTE — ED Notes (Signed)
Sent Case Manager a PM to contact the family regarding the care plan and plan for pt placement.

## 2021-01-28 NOTE — ED Notes (Signed)
Daughter feeding patient lunch.

## 2021-01-28 NOTE — ED Notes (Signed)
Pt given tray family assisting with feeding at bedside.

## 2021-01-28 NOTE — TOC Progression Note (Signed)
CSW spoke with [name] at [facility] who states that pt only has three days left for [facility] before she will be in copay days. CSW explained this to pts duaghters who have made CSW aware pt cannot afford to pay for SNF copay days. CSW has been speaking with pts daughters to create a plan. Pts daughter Generose plans to speak with her husband about taking pt to her home. She inquired about getting a hospital bed and [facility] services. CSW awaiting call back to see if this is the plan and where the hospital bed will need to be sent to. TOC to update when plan has been made with pts daughters.

## 2021-01-29 ENCOUNTER — Emergency Department (HOSPITAL_COMMUNITY): Payer: Medicare HMO

## 2021-01-29 DIAGNOSIS — E871 Hypo-osmolality and hyponatremia: Secondary | ICD-10-CM | POA: Diagnosis present

## 2021-01-29 DIAGNOSIS — G9529 Other cord compression: Secondary | ICD-10-CM | POA: Diagnosis not present

## 2021-01-29 DIAGNOSIS — S14124D Central cord syndrome at C4 level of cervical spinal cord, subsequent encounter: Secondary | ICD-10-CM | POA: Diagnosis not present

## 2021-01-29 DIAGNOSIS — R918 Other nonspecific abnormal finding of lung field: Secondary | ICD-10-CM | POA: Diagnosis not present

## 2021-01-29 DIAGNOSIS — Z981 Arthrodesis status: Secondary | ICD-10-CM | POA: Diagnosis not present

## 2021-01-29 DIAGNOSIS — I809 Phlebitis and thrombophlebitis of unspecified site: Secondary | ICD-10-CM | POA: Diagnosis not present

## 2021-01-29 DIAGNOSIS — E1169 Type 2 diabetes mellitus with other specified complication: Secondary | ICD-10-CM | POA: Diagnosis present

## 2021-01-29 DIAGNOSIS — Z66 Do not resuscitate: Secondary | ICD-10-CM | POA: Diagnosis not present

## 2021-01-29 DIAGNOSIS — I152 Hypertension secondary to endocrine disorders: Secondary | ICD-10-CM | POA: Diagnosis present

## 2021-01-29 DIAGNOSIS — S14124A Central cord syndrome at C4 level of cervical spinal cord, initial encounter: Secondary | ICD-10-CM | POA: Diagnosis present

## 2021-01-29 DIAGNOSIS — G9340 Encephalopathy, unspecified: Secondary | ICD-10-CM | POA: Diagnosis not present

## 2021-01-29 DIAGNOSIS — G992 Myelopathy in diseases classified elsewhere: Secondary | ICD-10-CM | POA: Diagnosis present

## 2021-01-29 DIAGNOSIS — A4159 Other Gram-negative sepsis: Secondary | ICD-10-CM | POA: Diagnosis present

## 2021-01-29 DIAGNOSIS — E1142 Type 2 diabetes mellitus with diabetic polyneuropathy: Secondary | ICD-10-CM | POA: Diagnosis present

## 2021-01-29 DIAGNOSIS — R7881 Bacteremia: Secondary | ICD-10-CM | POA: Diagnosis not present

## 2021-01-29 DIAGNOSIS — M4802 Spinal stenosis, cervical region: Secondary | ICD-10-CM | POA: Diagnosis not present

## 2021-01-29 DIAGNOSIS — A419 Sepsis, unspecified organism: Secondary | ICD-10-CM | POA: Diagnosis not present

## 2021-01-29 DIAGNOSIS — E669 Obesity, unspecified: Secondary | ICD-10-CM | POA: Diagnosis present

## 2021-01-29 DIAGNOSIS — N390 Urinary tract infection, site not specified: Secondary | ICD-10-CM | POA: Diagnosis present

## 2021-01-29 DIAGNOSIS — G8250 Quadriplegia, unspecified: Secondary | ICD-10-CM | POA: Diagnosis present

## 2021-01-29 DIAGNOSIS — I4891 Unspecified atrial fibrillation: Secondary | ICD-10-CM | POA: Diagnosis not present

## 2021-01-29 DIAGNOSIS — N179 Acute kidney failure, unspecified: Secondary | ICD-10-CM | POA: Diagnosis not present

## 2021-01-29 DIAGNOSIS — R6521 Severe sepsis with septic shock: Secondary | ICD-10-CM | POA: Diagnosis not present

## 2021-01-29 DIAGNOSIS — R571 Hypovolemic shock: Secondary | ICD-10-CM | POA: Diagnosis present

## 2021-01-29 DIAGNOSIS — N189 Chronic kidney disease, unspecified: Secondary | ICD-10-CM | POA: Diagnosis not present

## 2021-01-29 DIAGNOSIS — G9520 Unspecified cord compression: Secondary | ICD-10-CM | POA: Diagnosis not present

## 2021-01-29 DIAGNOSIS — M4322 Fusion of spine, cervical region: Secondary | ICD-10-CM | POA: Diagnosis not present

## 2021-01-29 DIAGNOSIS — N3000 Acute cystitis without hematuria: Secondary | ICD-10-CM | POA: Diagnosis not present

## 2021-01-29 DIAGNOSIS — E1140 Type 2 diabetes mellitus with diabetic neuropathy, unspecified: Secondary | ICD-10-CM | POA: Diagnosis not present

## 2021-01-29 DIAGNOSIS — B961 Klebsiella pneumoniae [K. pneumoniae] as the cause of diseases classified elsewhere: Secondary | ICD-10-CM | POA: Diagnosis not present

## 2021-01-29 DIAGNOSIS — Z20822 Contact with and (suspected) exposure to covid-19: Secondary | ICD-10-CM | POA: Diagnosis present

## 2021-01-29 DIAGNOSIS — E1122 Type 2 diabetes mellitus with diabetic chronic kidney disease: Secondary | ICD-10-CM | POA: Diagnosis present

## 2021-01-29 DIAGNOSIS — K219 Gastro-esophageal reflux disease without esophagitis: Secondary | ICD-10-CM | POA: Diagnosis not present

## 2021-01-29 DIAGNOSIS — Z01818 Encounter for other preprocedural examination: Secondary | ICD-10-CM | POA: Diagnosis not present

## 2021-01-29 DIAGNOSIS — E1110 Type 2 diabetes mellitus with ketoacidosis without coma: Secondary | ICD-10-CM | POA: Diagnosis not present

## 2021-01-29 DIAGNOSIS — R339 Retention of urine, unspecified: Secondary | ICD-10-CM | POA: Diagnosis present

## 2021-01-29 DIAGNOSIS — D631 Anemia in chronic kidney disease: Secondary | ICD-10-CM | POA: Diagnosis present

## 2021-01-29 DIAGNOSIS — R6520 Severe sepsis without septic shock: Secondary | ICD-10-CM | POA: Diagnosis not present

## 2021-01-29 DIAGNOSIS — E1159 Type 2 diabetes mellitus with other circulatory complications: Secondary | ICD-10-CM | POA: Diagnosis not present

## 2021-01-29 DIAGNOSIS — Z23 Encounter for immunization: Secondary | ICD-10-CM | POA: Diagnosis not present

## 2021-01-29 DIAGNOSIS — Z7189 Other specified counseling: Secondary | ICD-10-CM | POA: Diagnosis not present

## 2021-01-29 DIAGNOSIS — N170 Acute kidney failure with tubular necrosis: Secondary | ICD-10-CM | POA: Diagnosis present

## 2021-01-29 DIAGNOSIS — I129 Hypertensive chronic kidney disease with stage 1 through stage 4 chronic kidney disease, or unspecified chronic kidney disease: Secondary | ICD-10-CM | POA: Diagnosis present

## 2021-01-29 DIAGNOSIS — Z452 Encounter for adjustment and management of vascular access device: Secondary | ICD-10-CM | POA: Diagnosis not present

## 2021-01-29 DIAGNOSIS — M4712 Other spondylosis with myelopathy, cervical region: Secondary | ICD-10-CM | POA: Diagnosis not present

## 2021-01-29 DIAGNOSIS — A414 Sepsis due to anaerobes: Secondary | ICD-10-CM | POA: Diagnosis not present

## 2021-01-29 DIAGNOSIS — G9341 Metabolic encephalopathy: Secondary | ICD-10-CM | POA: Diagnosis present

## 2021-01-29 DIAGNOSIS — N1832 Chronic kidney disease, stage 3b: Secondary | ICD-10-CM | POA: Diagnosis present

## 2021-01-29 DIAGNOSIS — Z515 Encounter for palliative care: Secondary | ICD-10-CM | POA: Diagnosis not present

## 2021-01-29 DIAGNOSIS — E872 Acidosis: Secondary | ICD-10-CM | POA: Diagnosis present

## 2021-01-29 DIAGNOSIS — R0689 Other abnormalities of breathing: Secondary | ICD-10-CM | POA: Diagnosis not present

## 2021-01-29 DIAGNOSIS — D6959 Other secondary thrombocytopenia: Secondary | ICD-10-CM | POA: Diagnosis not present

## 2021-01-29 DIAGNOSIS — N183 Chronic kidney disease, stage 3 unspecified: Secondary | ICD-10-CM | POA: Diagnosis not present

## 2021-01-29 DIAGNOSIS — W109XXA Fall (on) (from) unspecified stairs and steps, initial encounter: Secondary | ICD-10-CM | POA: Diagnosis present

## 2021-01-29 DIAGNOSIS — Z6830 Body mass index (BMI) 30.0-30.9, adult: Secondary | ICD-10-CM | POA: Diagnosis not present

## 2021-01-29 LAB — CBC WITH DIFFERENTIAL/PLATELET
Abs Immature Granulocytes: 0.09 10*3/uL — ABNORMAL HIGH (ref 0.00–0.07)
Basophils Absolute: 0 10*3/uL (ref 0.0–0.1)
Basophils Relative: 0 %
Eosinophils Absolute: 0 10*3/uL (ref 0.0–0.5)
Eosinophils Relative: 0 %
HCT: 29 % — ABNORMAL LOW (ref 36.0–46.0)
Hemoglobin: 9.8 g/dL — ABNORMAL LOW (ref 12.0–15.0)
Immature Granulocytes: 1 %
Lymphocytes Relative: 6 %
Lymphs Abs: 0.6 10*3/uL — ABNORMAL LOW (ref 0.7–4.0)
MCH: 30.8 pg (ref 26.0–34.0)
MCHC: 33.8 g/dL (ref 30.0–36.0)
MCV: 91.2 fL (ref 80.0–100.0)
Monocytes Absolute: 0.7 10*3/uL (ref 0.1–1.0)
Monocytes Relative: 7 %
Neutro Abs: 9.4 10*3/uL — ABNORMAL HIGH (ref 1.7–7.7)
Neutrophils Relative %: 86 %
Platelets: 175 10*3/uL (ref 150–400)
RBC: 3.18 MIL/uL — ABNORMAL LOW (ref 3.87–5.11)
RDW: 13.5 % (ref 11.5–15.5)
WBC Morphology: INCREASED
WBC: 10.9 10*3/uL — ABNORMAL HIGH (ref 4.0–10.5)
nRBC: 0 % (ref 0.0–0.2)

## 2021-01-29 LAB — BASIC METABOLIC PANEL
Anion gap: 11 (ref 5–15)
BUN: 67 mg/dL — ABNORMAL HIGH (ref 8–23)
CO2: 19 mmol/L — ABNORMAL LOW (ref 22–32)
Calcium: 8.1 mg/dL — ABNORMAL LOW (ref 8.9–10.3)
Chloride: 94 mmol/L — ABNORMAL LOW (ref 98–111)
Creatinine, Ser: 2.47 mg/dL — ABNORMAL HIGH (ref 0.44–1.00)
GFR, Estimated: 19 mL/min — ABNORMAL LOW (ref >60)
Glucose, Bld: 208 mg/dL — ABNORMAL HIGH (ref 70–99)
Potassium: 5 mmol/L (ref 3.5–5.1)
Sodium: 124 mmol/L — ABNORMAL LOW (ref 135–145)

## 2021-01-29 LAB — URINALYSIS, ROUTINE W REFLEX MICROSCOPIC
Bilirubin Urine: NEGATIVE
Glucose, UA: NEGATIVE mg/dL
Ketones, ur: NEGATIVE mg/dL
Nitrite: NEGATIVE
Protein, ur: NEGATIVE mg/dL
Specific Gravity, Urine: 1.013 (ref 1.005–1.030)
WBC, UA: >50 WBC/hpf — ABNORMAL HIGH (ref 0–5)
pH: 5 (ref 5.0–8.0)

## 2021-01-29 LAB — LACTIC ACID, PLASMA: Lactic Acid, Venous: 1.4 mmol/L (ref 0.5–1.9)

## 2021-01-29 LAB — RESP PANEL BY RT-PCR (FLU A&B, COVID) ARPGX2
Influenza A by PCR: NEGATIVE
Influenza B by PCR: NEGATIVE
SARS Coronavirus 2 by RT PCR: NEGATIVE

## 2021-01-29 LAB — GLUCOSE, CAPILLARY: Glucose-Capillary: 190 mg/dL — ABNORMAL HIGH (ref 70–99)

## 2021-01-29 LAB — CBG MONITORING, ED: Glucose-Capillary: 189 mg/dL — ABNORMAL HIGH (ref 70–99)

## 2021-01-29 MED ORDER — SODIUM CHLORIDE 0.9 % IV SOLN: INTRAVENOUS | Status: DC

## 2021-01-29 MED ORDER — POLYETHYLENE GLYCOL 3350 17 G PO PACK: 17.0000 g | PACK | Freq: Every day | ORAL | Status: DC | PRN

## 2021-01-29 MED ORDER — ACETAMINOPHEN 650 MG RE SUPP: 650.0000 mg | Freq: Four times a day (QID) | RECTAL | Status: DC | PRN

## 2021-01-29 MED ORDER — SODIUM CHLORIDE 0.9 % IV SOLN
1.0000 g | INTRAVENOUS | Status: DC
  Filled 2021-01-29: qty 10

## 2021-01-29 MED ORDER — ACETAMINOPHEN 325 MG PO TABS
650.0000 mg | ORAL_TABLET | Freq: Once | ORAL | Status: AC
  Administered 2021-01-29: 650 mg via ORAL
  Filled 2021-01-29: qty 2

## 2021-01-29 MED ORDER — INSULIN ASPART 100 UNIT/ML IJ SOLN
0.0000 [IU] | INTRAMUSCULAR | Status: DC
  Administered 2021-01-30: 2 [IU] via SUBCUTANEOUS
  Administered 2021-01-30: 3 [IU] via SUBCUTANEOUS
  Administered 2021-01-30: 5 [IU] via SUBCUTANEOUS
  Administered 2021-01-30: 2 [IU] via SUBCUTANEOUS
  Administered 2021-01-30 – 2021-01-31 (×5): 3 [IU] via SUBCUTANEOUS
  Administered 2021-02-01: 5 [IU] via SUBCUTANEOUS
  Administered 2021-02-01: 2 [IU] via SUBCUTANEOUS
  Administered 2021-02-01: 5 [IU] via SUBCUTANEOUS
  Administered 2021-02-01: 2 [IU] via SUBCUTANEOUS
  Administered 2021-02-01: 5 [IU] via SUBCUTANEOUS
  Administered 2021-02-01 (×2): 3 [IU] via SUBCUTANEOUS
  Administered 2021-02-02: 2 [IU] via SUBCUTANEOUS
  Administered 2021-02-02 (×2): 3 [IU] via SUBCUTANEOUS

## 2021-01-29 MED ORDER — ONDANSETRON HCL 4 MG PO TABS: 4.0000 mg | ORAL_TABLET | Freq: Four times a day (QID) | ORAL | Status: DC | PRN

## 2021-01-29 MED ORDER — SODIUM CHLORIDE 0.9 % IV SOLN
1.0000 g | Freq: Once | INTRAVENOUS | Status: AC
  Administered 2021-01-29: 1 g via INTRAVENOUS
  Filled 2021-01-29: qty 10

## 2021-01-29 MED ORDER — ACETAMINOPHEN 325 MG PO TABS
650.0000 mg | ORAL_TABLET | Freq: Four times a day (QID) | ORAL | Status: DC | PRN
  Administered 2021-01-30 – 2021-02-08 (×9): 650 mg via ORAL
  Filled 2021-01-29 (×9): qty 2

## 2021-01-29 MED ORDER — ONDANSETRON HCL 4 MG/2ML IJ SOLN
4.0000 mg | Freq: Four times a day (QID) | INTRAMUSCULAR | Status: DC | PRN
  Administered 2021-02-02: 4 mg via INTRAVENOUS
  Filled 2021-01-29: qty 2

## 2021-01-29 NOTE — ED Notes (Addendum)
Pt noted to feel warm. Temp checked and fever is 102.8. MD made aware.

## 2021-01-29 NOTE — ED Notes (Signed)
Peri-care done. Daughter at bedside.

## 2021-01-29 NOTE — ED Notes (Signed)
Pt is resting at this time with eyes closed. No signs of distress.

## 2021-01-29 NOTE — ED Notes (Signed)
Pt repositioned for comfort at this time.

## 2021-01-29 NOTE — ED Notes (Signed)
MD made aware pt's family would like an update. Number provided for MD to call. Levell July [phone]

## 2021-01-29 NOTE — Progress Notes (Signed)
Received female pt,alert oriented not in distress on room air via stretcher, with cervical collar, intact, with ongoing IVF on right forearm, with foley catheter to bag, transferred to room 5 bed, activities well tolerated, noted dried abrasion on the chin and below the nose and skin tear in between the anal sacral and skin redness surrounding the skin tear non blanchable, cleaned and  covered with foam dressing

## 2021-01-29 NOTE — ED Notes (Addendum)
Pt with dry brief and a distended abdomen. Pt states "I feel like I need to pee and I can't". Bladder scan showing >999. In and out cath with 1200 total urine output. Pt states she feels relief. Dr. Annie Sable informed of same.

## 2021-01-29 NOTE — TOC Progression Note (Signed)
CSW updated that pts status has changed due to a new onset fever. CSW updated [employer] that pt will not be discharging home today. Family updated on this also. CSW updated by [employer] that pt will owe $140.79 for the first month for equipment. Each month after that for the next 12 months will be $31.89. CSW updated pts family of this and that [employer] will reach out to set up a payment plan. CSW informed that first months payment will need to be made before the equipment can be delivered. [employer] is awaiting signed TOC progress notes will documentation of need for DME. CSW awaiting MD cosign. TOC to follow.

## 2021-01-29 NOTE — TOC Progression Note (Addendum)
Transition of Care [facility]) - Progression Note    Patient Details  Name: [name] MRN: [id number] Date of Birth: 1936/06/02  Transition of Care [facility]) CM/SW [place], [place] Phone Number: [phone] 01/29/2021, 12:45 PM  Clinical Narrative:     CSW contacted [employer] and spoke with delivery customer service for DME services.  Patient's insurance is not in network with [employer] and the out-of-pocket cost for the patient would be 60%. CSW spoke with patient's daughter [name] [phone] and updated her on the insurance information. Ms. Maisie Fus stated she wanted CSW to contact other [place] providers.  CSW agreed and contacted Thedore Mins at [facility] DME. Thedore Mins stated he would contact this CSW with a reply.        Expected Discharge Plan and Services                                                 Social Determinants of Health (SDOH) Interventions    Readmission Risk Interventions Readmission Risk Prevention Plan 12/04/2020  Medication Screening Complete  Transportation Screening Complete  Some recent data might be hidden

## 2021-01-29 NOTE — ED Provider Notes (Signed)
Nursing staff approached me and stated that the patient has been in the ER for a few days now pending discharge home with home health supplies.  However she was found to be febrile today and brought to my attention.  I was told that she has been having difficulty urinating, unable to put out any urine.  I was told that she had a straight cath done earlier this morning and put out about 1200 cc of urine.  I went and saw the patient at this time.  She is still complaining of upper extremity and lower extremity weakness.  She states that prior to her fall she was able to ambulate with a walker and move her arms.  Currently having significant weakness to both arms.  Grip strength of both arms appears to be about 4 out of 5.  She is able to elevate both arms off the bed but cannot straighten them out fully and has a weak grip bilaterally.  Given her fall weakness and urine retention, these have been ordered.  Labs were sent, urinalysis ordered, patient given Tylenol.  MRI of the spine ordered and pending.     Luna Fuse, MD 01/29/21 1451

## 2021-01-29 NOTE — ED Notes (Signed)
Pt repositioned with pillow behind back. Hospitalist suggested. Pt feels more comfortable now. Will continue to monitor and assess pt.

## 2021-01-29 NOTE — ED Notes (Signed)
Family has been updated of pts new status.

## 2021-01-29 NOTE — H&P (Signed)
History and Physical    [name] [id number] DOB: 1936-05-25 DOA: 01/26/2021  PCP: Curlene Labrum, MD   Patient coming from: Home  I have personally briefly reviewed patient's old medical records in [place]  Chief Complaint: Fall, weakness  HPI: [name] is a 85 y.o. female with medical history significant for HTn, DM, CKD 3, neuropathy. Patient presented to the [facility] ED 5/28 with reports of a fall down 2 stairs.  The fall was a result of a commotion.   Initial imaging included head cervical and maxillofacial CT, left ankle tibia-fibula x-rays, chest x-ray, right and left shoulder x-rays, lumbar and pelvic x-ray all of which were negative for acute abnormality. Imaging 5/29 included chest CT abdomen, CT right shoulder and lumbar spine CT, showed distended urinary bladder and mild bilateral hydronephrosis and hydroureter to the bilateral ureterovesical junction obstructing calculus or other etiology identified. Patient reported she was unable to move her lower extremities, but she was moving her legs in bed, and with normal scans, patient was subsequently discharged from the ED 5/29 and presented again same day to the [facility] ED with complaints of pain.  Family complained that patient could not walk.  Evaluated by physical therapy and SNF was recommended, social worker was consulted with plans to place patient in SNF.   Today it was noted that to void, bladder scan showed greater than 999 mils of urine, in and out cath yielded 1200 mils of urine.  Patient also spiked a fever of 102.8.  Patient was reevaluated by ED provider today Dr. Almyra Free, patient reported weakness to her upper and lower extremities, MRI cervical thoracic and lumbar spine ordered-and showed severe canal stenosis with cord compression at C3-C4.  Patient tells me she has had urinary incontinence for a while prior to hospitalization, and fall she was able to ambulate.  She has also had prior neck surgeries.   ED  Course: Vitals today showed T-max of 102.8, heart rate 58-102, respiratory rate 16-23, blood pressure systolic [id number].  O2 sats greater than 93% on room air.  WBC 10.9.  Sodium 124.  Creatinine elevated 2.47.  UA suggestive of UTI.  IV ceftriaxone started, saline infusion started. EDP talked to neurosurgeon on-call Dr. Marcello Moores, recommended admission to [facility], will see in consult.  Review of Systems: As per HPI all other systems reviewed and negative.  Past Medical History:  Diagnosis Date  . Diabetes mellitus without complication ([place])   . High cholesterol   . Neck pain   . Neuropathy     Past Surgical History:  Procedure Laterality Date  . ABDOMINAL HYSTERECTOMY    . APPENDECTOMY    . BACK SURGERY    . CERVICAL DISC SURGERY    . HAND SURGERY    . KNEE SURGERY       reports that she has never smoked. She has never used smokeless tobacco. She reports that she does not drink alcohol and does not use drugs.  Allergies  Allergen Reactions  . Codeine     Family History  Problem Relation Age of Onset  . Cardiomyopathy Father   . Cancer Mother     Prior to Admission medications   Medication Sig Start Date End Date Taking? Authorizing Provider  acetaminophen (TYLENOL) 325 MG tablet Take 2 tablets (650 mg total) by mouth every 6 (six) hours. 12/04/20  Yes Talon Witting, [place], MD  albuterol (VENTOLIN HFA) 108 (90 Base) MCG/ACT inhaler Inhale 1 puff into the lungs every  4 (four) hours as needed for wheezing or shortness of breath.   Yes [provider]  atorvastatin (LIPITOR) 20 MG tablet Take 1 tablet (20 mg total) by mouth every evening. 12/20/20  Yes Gerlene Fee, NP  chlorthalidone (HYGROTON) 25 MG tablet Take 25 mg by mouth daily. 01/01/21  Yes [provider]  gabapentin (NEURONTIN) 300 MG capsule Take 1 capsule (300 mg total) by mouth 2 (two) times daily. 12/20/20  Yes Gerlene Fee, NP  LANTUS SOLOSTAR 100 UNIT/ML Solostar Pen Inject 30 Units into the skin  daily. 12/20/20  Yes Gerlene Fee, NP  metFORMIN (GLUCOPHAGE) 500 MG tablet Take 1 tablet (500 mg total) by mouth 2 (two) times daily with a meal. 12/20/20  Yes Gerlene Fee, NP  mirtazapine (REMERON) 15 MG tablet Take 15 mg by mouth at bedtime. 01/01/21  Yes [provider]  pantoprazole (PROTONIX) 40 MG tablet Take 40 mg by mouth daily.   Yes [provider]  bisacodyl (DULCOLAX) 10 MG suppository Place 1 suppository (10 mg total) rectally daily as needed for moderate constipation. Patient not taking: No sig reported 12/04/20   Roxan Hockey, MD  HYDROcodone-acetaminophen (NORCO/VICODIN) 5-325 MG tablet Take 1 tablet by mouth every 6 (six) hours as needed for severe pain. Patient not taking: No sig reported 01/26/21   Joy, Shawn C, PA-C  lisinopril (ZESTRIL) 10 MG tablet Take 1 tablet (10 mg total) by mouth daily. Patient not taking: No sig reported 12/20/20   Gerlene Fee, NP  methocarbamol (ROBAXIN) 500 MG tablet Take 1 tablet (500 mg total) by mouth 3 (three) times daily. Patient not taking: Reported on 01/26/2021 12/20/20   Gerlene Fee, NP  NON FORMULARY Dist Consistent Carbohydrate    [provider]  omeprazole (PRILOSEC) 20 MG capsule Take 20 mg by mouth daily. Patient not taking: No sig reported    [provider]  senna-docusate (SENOKOT-S) 8.6-50 MG tablet Take 2 tablets by mouth at bedtime. Patient not taking: No sig reported 12/04/20 12/04/21  Roxan Hockey, MD    Physical Exam: Vitals:   01/29/21 1500 01/29/21 1515 01/29/21 1517 01/29/21 1815  BP: (!) 149/66   (!) 92/49  Pulse: (!) 102 93 95 (!) 52  Resp: '18 19 20 '$ (!) 21  Temp:  (!) 102.4 F (39.1 C) (!) 102.4 F (39.1 C)   TempSrc:      SpO2: 97% 95% 95% 93%  Weight:      Height:        Constitutional: C-collar in place, patient uncomfortable  Vitals:   01/29/21 1500 01/29/21 1515 01/29/21 1517 01/29/21 1815  BP: (!) 149/66   (!) 92/49  Pulse: (!) 102 93 95 (!) 52   Resp: '18 19 20 '$ (!) 21  Temp:  (!) 102.4 F (39.1 C) (!) 102.4 F (39.1 C)   TempSrc:      SpO2: 97% 95% 95% 93%  Weight:      Height:       Eyes: PERRL, lids and conjunctivae normal ENMT: Mucous membranes are moist.  Neck: normal, supple, no masses, no thyromegaly Respiratory: clear to auscultation bilaterally, no wheezing, no crackles. Normal respiratory effort. No accessory muscle use.  Cardiovascular: Regular rate and rhythm, no murmurs / rubs / gallops. No extremity edema. 2+ pedal pulses.  Abdomen: no tenderness, no masses palpated. No hepatosplenomegaly. Bowel sounds positive.  Foley catheter in place Musculoskeletal: no clubbing / cyanosis. No joint deformity upper and lower extremities. Good ROM,  no contractures. Normal muscle tone.  Skin: no rashes, lesions, ulcers. No induration Neurologic: No facial asymmetry, speech clear and fluent without evidence of aphasia, she is able to lift both hands against gravity but not able to sustain it, poor grip strength bilaterally, 3/5 strength bilateral lower extremities.  She is unable to lift her lower extremities against gravity Psychiatric: Normal judgment and insight. Alert and oriented x 3. Normal mood.   Labs on Admission: I have personally reviewed following labs and imaging studies  CBC: Recent Labs  Lab 01/25/21 2050 01/29/21 1505  WBC 6.4 10.9*  NEUTROABS 3.0 9.4*  HGB 10.6* 9.8*  HCT 31.7* 29.0*  MCV 91.1 91.2  PLT 227 0000000   Basic Metabolic Panel: Recent Labs  Lab 01/25/21 2050 01/29/21 1505  NA 135 124*  K 4.2 5.0  CL 103 94*  CO2 22 19*  GLUCOSE 152* 208*  BUN 40* 67*  CREATININE 1.27* 2.47*  CALCIUM 8.7* 8.1*   Liver Function Tests: Recent Labs  Lab 01/25/21 2050  AST 22  ALT 16  ALKPHOS 56  BILITOT 0.3  PROT 6.4*  ALBUMIN 3.5   CBG: Recent Labs  Lab 01/29/21 0916  GLUCAP 189*   Urine analysis:    Component Value Date/Time   COLORURINE YELLOW 01/29/2021 1435   APPEARANCEUR CLOUDY (A)  01/29/2021 1435   LABSPEC 1.013 01/29/2021 1435   PHURINE 5.0 01/29/2021 1435   GLUCOSEU NEGATIVE 01/29/2021 1435   HGBUR MODERATE (A) 01/29/2021 1435   BILIRUBINUR NEGATIVE 01/29/2021 1435   KETONESUR NEGATIVE 01/29/2021 1435   PROTEINUR NEGATIVE 01/29/2021 1435   NITRITE NEGATIVE 01/29/2021 1435   LEUKOCYTESUR LARGE (A) 01/29/2021 1435    Radiological Exams on Admission: MR Cervical Spine Wo Contrast  Result Date: 01/29/2021 CLINICAL DATA:  Bilateral arm weakness, recent fall EXAM: MRI CERVICAL, THORACIC AND LUMBAR SPINE WITHOUT CONTRAST TECHNIQUE: Multiplanar and multiecho pulse sequences of the cervical spine, to include the craniocervical junction and cervicothoracic junction, and thoracic and lumbar spine, were obtained without intravenous contrast. COMPARISON:  MRI lumbar spine 12/02/2020 FINDINGS: MRI CERVICAL SPINE Alignment: Approximately 4 mm retrolisthesis at C3-C4. Vertebrae: Prior anterior fusion at C4-C6. Appears to be solid bridging bone. Hardware is not well evaluated and there is associated susceptibility artifact. Vertebral body heights are maintained apart from degenerative endplate irregularity at C3-C4 and C6-C7. No substantial marrow edema. No suspicious osseous lesion. Cord: Cord is compressed at C3-C4. There is abnormal cord signal at approximately C4 and C5 levels. Posterior Fossa, vertebral arteries, paraspinal tissues: Unremarkable. Disc levels: C2-C3: Right greater than left uncovertebral and facet hypertrophy. No canal stenosis. Mild to moderate foraminal stenosis, greater on the right. C3-C4: Disc bulge with endplate osteophytes. Uncovertebral hypertrophy. Severe canal stenosis with cord compression. Moderate to marked foraminal stenosis. C4-C5:  Fusion level.  No canal or foraminal stenosis. C5-C6: Fusion level. No canal stenosis. Spurring may mildly narrow the neural foramina. C6-C7: Disc bulge with endplate osteophytes. Uncovertebral and facet hypertrophy. Mild canal  stenosis. Mild to moderate foraminal stenosis. C7-T1: Endplate osteophytes and facet hypertrophy. No canal or foraminal stenosis. MRI THORACIC SPINE Alignment:  Preserved. Vertebrae: Vertebral body heights are maintained. There is no substantial marrow edema. No suspicious osseous lesion. Cord:  No abnormal signal. Paraspinal and other soft tissues: Unremarkable. Disc levels: Intervertebral disc heights and signal are maintained. Minor degenerative changes are present without stenosis. MRI LUMBAR SPINE Segmentation:  Standard. Alignment:  Stable with trace anterolisthesis at L4-L5. Vertebrae: Stable vertebral body heights. No substantial marrow edema.  No suspicious osseous lesion. Conus medullaris and cauda equina: Conus extends to the L1-L2 level. Conus and cauda equina appear normal. Paraspinal and other soft tissues: Unremarkable. Disc levels: L1-L2:  No stenosis. L2-L3:  Facet arthropathy.  No stenosis. L3-L4: Disc bulge. Facet arthropathy. No canal or right foraminal stenosis. Mild left foraminal stenosis. L4-L5: Disc bulge slightly eccentric to the right. Facet arthropathy. Minor canal stenosis with slight effacement of the subarticular recesses. Mild right foraminal stenosis. No left foraminal stenosis. L5-S1: Disc bulge with endplate osteophytic ridging eccentric to the left. Facet arthropathy. No canal stenosis. Partial effacement of the left subarticular recess. No right foraminal stenosis. Moderate to marked left foraminal stenosis. IMPRESSION: Postoperative and multilevel degenerative changes of the cervical spine. There is severe canal stenosis with cord compression at C3-C4. Abnormal cord signal is present just below this level (at operative levels) and may reflect edema or myelomalacia No significant degenerative changes of the thoracic spine. Multilevel degenerative changes of lumbar spine similar to recent prior study. These results were called by telephone at the time of interpretation on 01/29/2021  at 5:20 pm to provider Dr. Rogene Houston, who verbally acknowledged these results. Electronically Signed   By: Macy Mis M.D.   On: 01/29/2021 17:38   MR THORACIC SPINE WO CONTRAST  Result Date: 01/29/2021 CLINICAL DATA:  Bilateral arm weakness, recent fall EXAM: MRI CERVICAL, THORACIC AND LUMBAR SPINE WITHOUT CONTRAST TECHNIQUE: Multiplanar and multiecho pulse sequences of the cervical spine, to include the craniocervical junction and cervicothoracic junction, and thoracic and lumbar spine, were obtained without intravenous contrast. COMPARISON:  MRI lumbar spine 12/02/2020 FINDINGS: MRI CERVICAL SPINE Alignment: Approximately 4 mm retrolisthesis at C3-C4. Vertebrae: Prior anterior fusion at C4-C6. Appears to be solid bridging bone. Hardware is not well evaluated and there is associated susceptibility artifact. Vertebral body heights are maintained apart from degenerative endplate irregularity at C3-C4 and C6-C7. No substantial marrow edema. No suspicious osseous lesion. Cord: Cord is compressed at C3-C4. There is abnormal cord signal at approximately C4 and C5 levels. Posterior Fossa, vertebral arteries, paraspinal tissues: Unremarkable. Disc levels: C2-C3: Right greater than left uncovertebral and facet hypertrophy. No canal stenosis. Mild to moderate foraminal stenosis, greater on the right. C3-C4: Disc bulge with endplate osteophytes. Uncovertebral hypertrophy. Severe canal stenosis with cord compression. Moderate to marked foraminal stenosis. C4-C5:  Fusion level.  No canal or foraminal stenosis. C5-C6: Fusion level. No canal stenosis. Spurring may mildly narrow the neural foramina. C6-C7: Disc bulge with endplate osteophytes. Uncovertebral and facet hypertrophy. Mild canal stenosis. Mild to moderate foraminal stenosis. C7-T1: Endplate osteophytes and facet hypertrophy. No canal or foraminal stenosis. MRI THORACIC SPINE Alignment:  Preserved. Vertebrae: Vertebral body heights are maintained. There is no  substantial marrow edema. No suspicious osseous lesion. Cord:  No abnormal signal. Paraspinal and other soft tissues: Unremarkable. Disc levels: Intervertebral disc heights and signal are maintained. Minor degenerative changes are present without stenosis. MRI LUMBAR SPINE Segmentation:  Standard. Alignment:  Stable with trace anterolisthesis at L4-L5. Vertebrae: Stable vertebral body heights. No substantial marrow edema. No suspicious osseous lesion. Conus medullaris and cauda equina: Conus extends to the L1-L2 level. Conus and cauda equina appear normal. Paraspinal and other soft tissues: Unremarkable. Disc levels: L1-L2:  No stenosis. L2-L3:  Facet arthropathy.  No stenosis. L3-L4: Disc bulge. Facet arthropathy. No canal or right foraminal stenosis. Mild left foraminal stenosis. L4-L5: Disc bulge slightly eccentric to the right. Facet arthropathy. Minor canal stenosis with slight effacement of the subarticular recesses. Mild right foraminal  stenosis. No left foraminal stenosis. L5-S1: Disc bulge with endplate osteophytic ridging eccentric to the left. Facet arthropathy. No canal stenosis. Partial effacement of the left subarticular recess. No right foraminal stenosis. Moderate to marked left foraminal stenosis. IMPRESSION: Postoperative and multilevel degenerative changes of the cervical spine. There is severe canal stenosis with cord compression at C3-C4. Abnormal cord signal is present just below this level (at operative levels) and may reflect edema or myelomalacia No significant degenerative changes of the thoracic spine. Multilevel degenerative changes of lumbar spine similar to recent prior study. These results were called by telephone at the time of interpretation on 01/29/2021 at 5:20 pm to provider Dr. Rogene Houston, who verbally acknowledged these results. Electronically Signed   By: Macy Mis M.D.   On: 01/29/2021 17:38   MR LUMBAR SPINE WO CONTRAST  Result Date: 01/29/2021 CLINICAL DATA:  Bilateral  arm weakness, recent fall EXAM: MRI CERVICAL, THORACIC AND LUMBAR SPINE WITHOUT CONTRAST TECHNIQUE: Multiplanar and multiecho pulse sequences of the cervical spine, to include the craniocervical junction and cervicothoracic junction, and thoracic and lumbar spine, were obtained without intravenous contrast. COMPARISON:  MRI lumbar spine 12/02/2020 FINDINGS: MRI CERVICAL SPINE Alignment: Approximately 4 mm retrolisthesis at C3-C4. Vertebrae: Prior anterior fusion at C4-C6. Appears to be solid bridging bone. Hardware is not well evaluated and there is associated susceptibility artifact. Vertebral body heights are maintained apart from degenerative endplate irregularity at C3-C4 and C6-C7. No substantial marrow edema. No suspicious osseous lesion. Cord: Cord is compressed at C3-C4. There is abnormal cord signal at approximately C4 and C5 levels. Posterior Fossa, vertebral arteries, paraspinal tissues: Unremarkable. Disc levels: C2-C3: Right greater than left uncovertebral and facet hypertrophy. No canal stenosis. Mild to moderate foraminal stenosis, greater on the right. C3-C4: Disc bulge with endplate osteophytes. Uncovertebral hypertrophy. Severe canal stenosis with cord compression. Moderate to marked foraminal stenosis. C4-C5:  Fusion level.  No canal or foraminal stenosis. C5-C6: Fusion level. No canal stenosis. Spurring may mildly narrow the neural foramina. C6-C7: Disc bulge with endplate osteophytes. Uncovertebral and facet hypertrophy. Mild canal stenosis. Mild to moderate foraminal stenosis. C7-T1: Endplate osteophytes and facet hypertrophy. No canal or foraminal stenosis. MRI THORACIC SPINE Alignment:  Preserved. Vertebrae: Vertebral body heights are maintained. There is no substantial marrow edema. No suspicious osseous lesion. Cord:  No abnormal signal. Paraspinal and other soft tissues: Unremarkable. Disc levels: Intervertebral disc heights and signal are maintained. Minor degenerative changes are present  without stenosis. MRI LUMBAR SPINE Segmentation:  Standard. Alignment:  Stable with trace anterolisthesis at L4-L5. Vertebrae: Stable vertebral body heights. No substantial marrow edema. No suspicious osseous lesion. Conus medullaris and cauda equina: Conus extends to the L1-L2 level. Conus and cauda equina appear normal. Paraspinal and other soft tissues: Unremarkable. Disc levels: L1-L2:  No stenosis. L2-L3:  Facet arthropathy.  No stenosis. L3-L4: Disc bulge. Facet arthropathy. No canal or right foraminal stenosis. Mild left foraminal stenosis. L4-L5: Disc bulge slightly eccentric to the right. Facet arthropathy. Minor canal stenosis with slight effacement of the subarticular recesses. Mild right foraminal stenosis. No left foraminal stenosis. L5-S1: Disc bulge with endplate osteophytic ridging eccentric to the left. Facet arthropathy. No canal stenosis. Partial effacement of the left subarticular recess. No right foraminal stenosis. Moderate to marked left foraminal stenosis. IMPRESSION: Postoperative and multilevel degenerative changes of the cervical spine. There is severe canal stenosis with cord compression at C3-C4. Abnormal cord signal is present just below this level (at operative levels) and may reflect edema or myelomalacia No significant  degenerative changes of the thoracic spine. Multilevel degenerative changes of lumbar spine similar to recent prior study. These results were called by telephone at the time of interpretation on 01/29/2021 at 5:20 pm to provider Dr. Rogene Houston, who verbally acknowledged these results. Electronically Signed   By: Macy Mis M.D.   On: 01/29/2021 17:38    EKG: None  Assessment/Plan Principal Problem:   Cord compression [facility]) Active Problems:   Acute kidney injury superimposed on CKD (HCC)   Hyponatremia   Hypertension associated with diabetes ([place])   Acute lower UTI  Cord compression from spinal stenosis-MRI cervical spine shows C3 C5 severe canal stenosis  with cord compression.  Abnormal cord signal is present just below this level at operative levels and may reflect edema or myelomalacia. - EDP talked to neurosurgeon Dr. Marcello Moores, admit to [facility], Will see in consult -N.p.o. midnight pending neurosurgery evaluation  Urinary tract infection-UA suggestive with large leukocytes, WBCs and many bacteria.  Foley catheter placed today.  Febrile to 102.8, but does not meet sepsis criteria.  LActic acid of 1.4. -IV ceftriaxone 1 g daily -Follow-up urine cultures  Hypovolemic hyponatremia-sodium 124. Baseline- Mid 130s. -Hydrate, N/s 100cc/hr x 20 hrs  AKI on CKD 3 A-creatinine 2.47, baseline about 1.2.  Likely postrenal from inability to void, with 1200 mils of urine drained from bladder via in and out cath.  An abdominal CT 5/29- showing distended bladder, mild bilateral hydronephrosis and hydroureter to bilateral ureterovesical junction.  - Hydrate - Hold Lisinopril, chlorthalidone  Mechanical fall-likely related to bilateral lower extremity weakness.  Hypertension-soft. -Hold home chlorthalidone, lisinopril.  Diabetes mellitus-random glucose 208.  A1c 8.6. - SSI- M - Hold lantus for now   DVT prophylaxis: Scds, pending neurosurg eval in a.m Code Status: Full code Family Communication: None at bedside Disposition Plan: > 2 days Consults called: neurosurgery Admission status: Inpatient, MedSurg I certify that at the point of admission it is my clinical judgment that the patient will require inpatient hospital care spanning beyond 2 midnights from the point of admission due to high intensity of service, high risk for further deterioration and high frequency of surveillance required.    Bethena Roys MD [facility]  01/29/2021, 9:02 PM

## 2021-01-29 NOTE — ED Notes (Signed)
Pt changed and repositioned. Pt distended. bladder scan <999. In/out cathed 1276m

## 2021-01-29 NOTE — Progress Notes (Cosign Needed Addendum)
    Durable Medical Equipment  (From admission, onward)         Start     Ordered   01/29/21 1347  For home use only DME Hospital bed  Once       Question Answer Comment  Length of Need Lifetime   Patient has (list medical condition): uncontrolled back pain, inability to bear weight, avasular neucrosis   The above medical condition requires: Patient requires the ability to reposition frequently   Head must be elevated greater than: 30 degrees   Bed type Semi-electric   Support Surface: Gel Overlay      01/29/21 1348   01/28/21 1951  For home use only DME Overbed table  Once        01/28/21 1952   01/28/21 1951  For home use only DME standard manual wheelchair with seat cushion  Once       Comments: Patient suffers from back pain which impairs their ability to perform daily activities like toileting in the home.  A walker will not resolve issue with performing activities of daily living. A wheelchair will allow patient to safely perform daily activities. Patient can safely propel the wheelchair in the home or has a caregiver who can provide assistance. Length of need 6 months . Accessories: elevating leg rests (ELRs), wheel locks, extensions and anti-tippers.   01/28/21 1952

## 2021-01-29 NOTE — ED Provider Notes (Addendum)
Patient today developed fever.  Urinary retention.  Noted to have upper extremity weakness.  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  Discussed with Dr. Marcello Moores neurosurgery on-call.  Wants patient admitted to hospital service [facility].  Patient's urinalysis appears to show evidence of infection.  Lactic acid is not elevated.  Urinary retention may very well be secondary to the cord compression.  Patient will be started on Rocephin urine sent for culture.  Blood culture sent.  White count is 10.9.  Repeat basic metabolic panel significant for sodium of 124 CO2 of 19 glucose of 208 BUN of 67 creatinine of 2.47 GFR 19.  Significantly different from her baseline.  Discussed with the hospitalist to have the patient admitted to [facility].  Neurosurgery will consult.  No immediate need for surgery.  Patient's COVID testing negative.   Fredia Sorrow, MD 01/29/21 1813  CRITICAL CARE Performed by: Fredia Sorrow Total critical care time: 35 minutes Critical care time was exclusive of separately billable procedures and treating other patients. Critical care was necessary to treat or prevent imminent or life-threatening deterioration. Critical care was time spent personally by me on the following activities: development of treatment plan with patient and/or surrogate as well as nursing, discussions with consultants, evaluation of patient's response to treatment, examination of patient, obtaining history from patient or surrogate, ordering and performing treatments and interventions, ordering and review of laboratory studies, ordering and review of radiographic studies, pulse oximetry and re-evaluation of patient's condition.     Fredia Sorrow, MD 01/29/21 [phone]

## 2021-01-30 ENCOUNTER — Other Ambulatory Visit (HOSPITAL_COMMUNITY): Payer: Medicare HMO

## 2021-01-30 ENCOUNTER — Other Ambulatory Visit: Payer: Self-pay | Admitting: Neurosurgery

## 2021-01-30 ENCOUNTER — Inpatient Hospital Stay (HOSPITAL_COMMUNITY): Payer: Medicare HMO

## 2021-01-30 DIAGNOSIS — E871 Hypo-osmolality and hyponatremia: Secondary | ICD-10-CM

## 2021-01-30 DIAGNOSIS — N179 Acute kidney failure, unspecified: Secondary | ICD-10-CM

## 2021-01-30 DIAGNOSIS — N189 Chronic kidney disease, unspecified: Secondary | ICD-10-CM

## 2021-01-30 DIAGNOSIS — G9520 Unspecified cord compression: Secondary | ICD-10-CM | POA: Diagnosis not present

## 2021-01-30 LAB — BLOOD CULTURE ID PANEL (REFLEXED) - BCID2

## 2021-01-30 LAB — GLUCOSE, CAPILLARY
Glucose-Capillary: 144 mg/dL — ABNORMAL HIGH (ref 70–99)
Glucose-Capillary: 125 mg/dL — ABNORMAL HIGH (ref 70–99)
Glucose-Capillary: 126 mg/dL — ABNORMAL HIGH (ref 70–99)
Glucose-Capillary: 115 mg/dL — ABNORMAL HIGH (ref 70–99)
Glucose-Capillary: 196 mg/dL — ABNORMAL HIGH (ref 70–99)
Glucose-Capillary: 202 mg/dL — ABNORMAL HIGH (ref 70–99)

## 2021-01-30 LAB — BASIC METABOLIC PANEL
Anion gap: 15 (ref 5–15)
Anion gap: 12 (ref 5–15)
BUN: 72 mg/dL — ABNORMAL HIGH (ref 8–23)
BUN: 72 mg/dL — ABNORMAL HIGH (ref 8–23)
CO2: 16 mmol/L — ABNORMAL LOW (ref 22–32)
CO2: 15 mmol/L — ABNORMAL LOW (ref 22–32)
Calcium: 7.7 mg/dL — ABNORMAL LOW (ref 8.9–10.3)
Calcium: 7.4 mg/dL — ABNORMAL LOW (ref 8.9–10.3)
Chloride: 93 mmol/L — ABNORMAL LOW (ref 98–111)
Chloride: 101 mmol/L (ref 98–111)
Creatinine, Ser: 2.75 mg/dL — ABNORMAL HIGH (ref 0.44–1.00)
Creatinine, Ser: 2.85 mg/dL — ABNORMAL HIGH (ref 0.44–1.00)
GFR, Estimated: 17 mL/min — ABNORMAL LOW (ref >60)
GFR, Estimated: 16 mL/min — ABNORMAL LOW (ref >60)
Glucose, Bld: 142 mg/dL — ABNORMAL HIGH (ref 70–99)
Glucose, Bld: 110 mg/dL — ABNORMAL HIGH (ref 70–99)
Potassium: 4.7 mmol/L (ref 3.5–5.1)
Potassium: 4.7 mmol/L (ref 3.5–5.1)
Sodium: 124 mmol/L — ABNORMAL LOW (ref 135–145)
Sodium: 128 mmol/L — ABNORMAL LOW (ref 135–145)

## 2021-01-30 LAB — CBC
HCT: 25.3 % — ABNORMAL LOW (ref 36.0–46.0)
Hemoglobin: 8.8 g/dL — ABNORMAL LOW (ref 12.0–15.0)
MCH: 30.4 pg (ref 26.0–34.0)
MCHC: 34.8 g/dL (ref 30.0–36.0)
MCV: 87.5 fL (ref 80.0–100.0)
Platelets: 138 10*3/uL — ABNORMAL LOW (ref 150–400)
RBC: 2.89 MIL/uL — ABNORMAL LOW (ref 3.87–5.11)
RDW: 13.6 % (ref 11.5–15.5)
WBC: 11.6 10*3/uL — ABNORMAL HIGH (ref 4.0–10.5)
nRBC: 0 % (ref 0.0–0.2)

## 2021-01-30 LAB — OSMOLALITY: Osmolality: 290 mOsm/kg (ref 275–295)

## 2021-01-30 LAB — LACTIC ACID, PLASMA
Lactic Acid, Venous: 1.3 mmol/L (ref 0.5–1.9)
Lactic Acid, Venous: 1.7 mmol/L (ref 0.5–1.9)

## 2021-01-30 LAB — APTT: aPTT: 30 seconds (ref 24–36)

## 2021-01-30 LAB — PROTIME-INR
INR: 1.6 — ABNORMAL HIGH (ref 0.8–1.2)
Prothrombin Time: 18.8 seconds — ABNORMAL HIGH (ref 11.4–15.2)

## 2021-01-30 LAB — OSMOLALITY, URINE: Osmolality, Ur: 351 mOsm/kg (ref 300–900)

## 2021-01-30 LAB — CREATININE, URINE, RANDOM: Creatinine, Urine: 154.56 mg/dL

## 2021-01-30 LAB — SODIUM, URINE, RANDOM: Sodium, Ur: <10 mmol/L

## 2021-01-30 LAB — PROCALCITONIN: Procalcitonin: 101.37 ng/mL

## 2021-01-30 LAB — MRSA PCR SCREENING: MRSA by PCR: NEGATIVE

## 2021-01-30 MED ORDER — STERILE WATER FOR INJECTION IV SOLN
INTRAVENOUS | Status: DC
  Filled 2021-01-30: qty 1000

## 2021-01-30 MED ORDER — SODIUM CHLORIDE 0.9 % IV SOLN: INTRAVENOUS | Status: DC

## 2021-01-30 MED ORDER — LACTATED RINGERS IV BOLUS
1000.0000 mL | Freq: Once | INTRAVENOUS | Status: AC
  Administered 2021-01-30: 1000 mL via INTRAVENOUS

## 2021-01-30 MED ORDER — ALBUMIN HUMAN 25 % IV SOLN
25.0000 g | Freq: Once | INTRAVENOUS | Status: AC
  Administered 2021-01-30: 25 g via INTRAVENOUS
  Filled 2021-01-30: qty 100

## 2021-01-30 MED ORDER — CHLORHEXIDINE GLUCONATE CLOTH 2 % EX PADS
6.0000 | MEDICATED_PAD | Freq: Every day | CUTANEOUS | Status: DC
  Administered 2021-01-30 – 2021-02-12 (×15): 6 via TOPICAL

## 2021-01-30 MED ORDER — LACTATED RINGERS IV SOLN: INTRAVENOUS | Status: DC

## 2021-01-30 MED ORDER — SODIUM CHLORIDE 0.9 % IV BOLUS
500.0000 mL | Freq: Once | INTRAVENOUS | Status: DC
  Administered 2021-01-30: 500 mL via INTRAVENOUS

## 2021-01-30 MED ORDER — SODIUM CHLORIDE 0.9 % IV BOLUS
500.0000 mL | Freq: Once | INTRAVENOUS | Status: AC
  Administered 2021-01-30: 500 mL via INTRAVENOUS

## 2021-01-30 MED ORDER — SODIUM CHLORIDE 0.9 % IV BOLUS: 500.0000 mL | Freq: Once | INTRAVENOUS | Status: DC

## 2021-01-30 MED ORDER — SODIUM CHLORIDE 0.9 % IV SOLN
2.0000 g | INTRAVENOUS | Status: AC
  Administered 2021-01-30 – 2021-02-03 (×5): 2 g via INTRAVENOUS
  Filled 2021-01-30 (×3): qty 20
  Filled 2021-01-30 (×2): qty 2

## 2021-01-30 MED ORDER — NOREPINEPHRINE 4 MG/250ML-% IV SOLN
2.0000 ug/min | INTRAVENOUS | Status: DC
  Administered 2021-01-30 – 2021-01-31 (×2): 2 ug/min via INTRAVENOUS
  Filled 2021-01-30 (×3): qty 250

## 2021-01-30 MED ORDER — RINGERS IV SOLN: INTRAVENOUS | Status: DC

## 2021-01-30 MED ORDER — SODIUM CHLORIDE 0.9 % IV SOLN: INTRAVENOUS | Status: DC | PRN

## 2021-01-30 MED ORDER — SODIUM CHLORIDE 0.9 % IV SOLN
250.0000 mL | INTRAVENOUS | Status: DC
  Administered 2021-01-30 – 2021-02-01 (×2): 250 mL via INTRAVENOUS

## 2021-01-30 NOTE — Progress Notes (Signed)
PHARMACY - PHYSICIAN COMMUNICATION CRITICAL VALUE ALERT - BLOOD CULTURE IDENTIFICATION (BCID)  [name] is an 85 y.o. female who presented to [facility] on 01/26/2021 with a chief complaint of a fall with weakness. Also noted history of urinary incontinence.   Assessment: 85 year old female admitted with cord compression from spinal stenosis and potential UTI. Now with gram negative rods in 4/4 blood cultures. Awaiting BCID.   Name of physician (or Provider) Contacted: [place]   Current antibiotics: Ceftriaxone 1 gm q 24 hours   Changes to prescribed antibiotics recommended:  Increase ceftriaxone to 2 gm every 24 hours and await [employer] results   No results found for this or any previous visit.  Jimmy Footman, PharmD, BCPS, BCIDP Infectious Diseases Clinical Pharmacist Phone: [phone] 01/30/2021  8:34 AM

## 2021-01-30 NOTE — Progress Notes (Signed)
Pt kept on asking to remove the cervical collar, explanation was given on the importance of the collar to pt neck but still asking to remove it, pt asked to let her go home now, explained that morning doctor will come to see her but pt refused, pt asked for water to drink  told that the doctor  ordered for NPO, advised pt to rest and told her many times that her daughter will come in the morning

## 2021-01-30 NOTE — Progress Notes (Signed)
Notified bedside nurse of need to draw lactic acid, blood cultures drawn last evening.

## 2021-01-30 NOTE — Progress Notes (Signed)
PHARMACY - PHYSICIAN COMMUNICATION CRITICAL VALUE ALERT - BLOOD CULTURE IDENTIFICATION (BCID)  [name] is an 85 y.o. female who presented to [facility] on 01/26/2021 with a chief complaint of AMS, cord compression with stenosis s/p recent fall.   Assessment: 58 YOF with urinary incontinence and fevers PTA concerning for UTI and now with 4/4 BCx growing GNR with BCID detecting Kleb PNA - no resistance detected.   Name of physician (or Provider) Contacted: Andres Labrum, PA  Current antibiotics: Rocephin 2g IV every 24 hours  Changes to prescribed antibiotics recommended:  Patient is on recommended antibiotics - No changes needed  Results for orders placed or performed during the hospital encounter of 01/26/21  Blood Culture ID Panel (Reflexed) (Collected: 01/29/2021  5:30 PM)  Result Value Ref Range   Enterococcus faecalis NOT DETECTED NOT DETECTED   Enterococcus Faecium NOT DETECTED NOT DETECTED   Listeria monocytogenes NOT DETECTED NOT DETECTED   Staphylococcus species NOT DETECTED NOT DETECTED   Staphylococcus aureus (BCID) NOT DETECTED NOT DETECTED   Staphylococcus epidermidis NOT DETECTED NOT DETECTED   Staphylococcus lugdunensis NOT DETECTED NOT DETECTED   Streptococcus species NOT DETECTED NOT DETECTED   Streptococcus agalactiae NOT DETECTED NOT DETECTED   Streptococcus pneumoniae NOT DETECTED NOT DETECTED   Streptococcus pyogenes NOT DETECTED NOT DETECTED   A.calcoaceticus-baumannii NOT DETECTED NOT DETECTED   Bacteroides fragilis NOT DETECTED NOT DETECTED   Enterobacterales DETECTED (A) NOT DETECTED   Enterobacter cloacae complex NOT DETECTED NOT DETECTED   Escherichia coli NOT DETECTED NOT DETECTED   Klebsiella aerogenes NOT DETECTED NOT DETECTED   Klebsiella oxytoca NOT DETECTED NOT DETECTED   Klebsiella pneumoniae DETECTED (A) NOT DETECTED   Proteus species NOT DETECTED NOT DETECTED   Salmonella species NOT DETECTED NOT DETECTED   Serratia marcescens NOT DETECTED  NOT DETECTED   Haemophilus influenzae NOT DETECTED NOT DETECTED   Neisseria meningitidis NOT DETECTED NOT DETECTED   Pseudomonas aeruginosa NOT DETECTED NOT DETECTED   Stenotrophomonas maltophilia NOT DETECTED NOT DETECTED   Candida albicans NOT DETECTED NOT DETECTED   Candida auris NOT DETECTED NOT DETECTED   Candida glabrata NOT DETECTED NOT DETECTED   Candida krusei NOT DETECTED NOT DETECTED   Candida parapsilosis NOT DETECTED NOT DETECTED   Candida tropicalis NOT DETECTED NOT DETECTED   Cryptococcus neoformans/gattii NOT DETECTED NOT DETECTED   CTX-M ESBL NOT DETECTED NOT DETECTED   Carbapenem resistance IMP NOT DETECTED NOT DETECTED   Carbapenem resistance KPC NOT DETECTED NOT DETECTED   Carbapenem resistance NDM NOT DETECTED NOT DETECTED   Carbapenem resist OXA 48 LIKE NOT DETECTED NOT DETECTED   Carbapenem resistance VIM NOT DETECTED NOT DETECTED    Thank you for allowing pharmacy to be a part of this patient's care.  Alycia Rossetti, PharmD, BCPS Clinical Pharmacist Clinical phone for 01/30/2021: [phone] 01/30/2021 2:42 PM   **Pharmacist phone directory can now be found on [email or web address] (PW [email or web address]).  Listed under [place].

## 2021-01-30 NOTE — Progress Notes (Signed)
Kept on NPO as ordered

## 2021-01-30 NOTE — Progress Notes (Signed)
PROGRESS NOTE   [name]  [id number]    DOB: [birth year]    DOA: 01/26/2021  PCP: Curlene Labrum, MD   I have briefly reviewed patients previous medical records in [facility].  Chief Complaint  Patient presents with  . Fall    Brief Narrative:  85 year old female, recently discharged from SNF rehab mid April, lives alone, ambulates with the help of a walker, medical history significant for but not limited to HTN, DM2, CKD stage IIIb, neuropathy, seen initially at [facility] ED on 5/28 after fall down 4-5 stairs at her daughter's house whom she was visiting, extensive imaging without acute abnormalities and patient apparently left AMA.  However on reaching home, patient profoundly weak, unable to stand up even with multiple assistance, pain, daughter had EMT take her to [facility] ED on 5/29.  Further imaging including CT chest abdomen and pelvis without acute findings.  PT evaluated, SNF recommended and TOC were exploring options.  Patient remained at [facility] ED.  Per TOC, patient was about to exhaust her SNF days, unable to pay co-pay out-of-pocket and eventual plan was to take patient to daughter's home.  Ongoing pain issues, all extremity weakness.  On 6/1, noted to have acute urinary retention with fever.  MRI spine showed C3-4 cord compression, EDP discussed with neurosurgery, patient transferred to [facility] progressive care unit.  Neurosurgery evaluated and plan surgery early next week.  Morning of 6/2, patient with persistent hypotension despite aggressive IV fluid resuscitation, blood cultures 4/4 from [facility] positive for Klebsiella pneumonia.  Focused sepsis protocol initiated.  PCCM consulted and patient transferred to ICU for close monitoring and management.  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  Abnormal cord signal is present just below this level (at operative levels) and may reflect edema or myelomalacia.  No acute abnormalities on MRI of T and L-spine.  Unsure how her fall made this worse.  Discussed with Dr. Marcello Moores, Neurosurgery this morning, no role of steroids, plans for surgical intervention early next week.  He discontinued the hard cervical collar.  Severe sepsis, not POA, secondary to Klebsiella pneumonia bacteremia likely from UTI: Febrile on 6/1.  Mild leukocytosis.  Hypotensive with SBP in the 80s this morning.  Resuscitated with 1 L IV normal saline bolus followed by 150-200 mL/h IVF resuscitation.  Did not resuscitate per sepsis protocol i.e. 30 mL/h IVF due to AKI, oliguria and concern for volume overload.  Despite IVF, remained hypotensive, got IV albumin, consulted PCCM, transferred to ICU for close monitoring and possible need for vasopressors.  Continue IV ceftriaxone 2 g daily pending sensitivities.  Multifactorial shock (hypovolemic, sepsis and neurogenic from cord compression): Continue IV fluids and may need vasopressors.  Transferred to ICU with CCM consulting.  Acute urinary retention: Likely due to cervical cord compression.  Continue Foley catheter.  Acute kidney injury complicating stage IIIb chronic kidney disease/anion gap metabolic acidosis: Likely multifactorial due to hypovolemia/prerenal, ATN from hypotension and sepsis and acute urinary retention.  Renal ultrasound 6/2 without acute abnormality.  Foley to remain.  Strict intake output.  Avoid nephrotoxins.  IV fluid resuscitation.  Trend daily BM.  Nephrology consultation appreciated.  Baseline creatinine likely around 1.2.  Acute metabolic encephalopathy: Multifactorial due to sepsis, AKI, opioid pain meds.  Minimize opioids  and sedatives.  Delirium precautions.  Treat the treatable and monitor closely.  May need to cut back on gabapentin dose.  Dehydration with hyponatremia: IV bicarb drip changed to LR by PCCM.  Follow BMP daily.  Acute anemia: Probably multifactorial due to acute illness, hemodilution.  No bleeding reported.  Trend daily CBC.  Acute thrombocytopenia: Likely related to sepsis.  Trend daily CBC.  Essential hypertension: Now with shock.  Obviously holding antihypertensives.  Type II DM with peripheral neuropathy: Reasonable inpatient control.  SSI.  Hyperlipidemia: Statins.  Body mass index is 30.18 kg/m.   DVT prophylaxis: SCDs Start: 01/29/21 2349 Place and maintain sequential compression device Start: 01/29/21 2346     Code Status: Full Code Family Communication: I discussed in detail with patient's daughter at bedside this morning, updated care and answered all questions.  Later this afternoon, I discussed in detail with patient's great grandson at bedside. Disposition:  Status is: Inpatient  Remains inpatient appropriate because:Inpatient level of care appropriate due to severity of illness   Dispo: The patient is from: Home              Anticipated d/c is to: TBD              Patient currently is not medically stable to d/c.   Difficult to place patient No        Consultants:   Nephrology Neurosurgery PCCM  Procedures:   Foley catheter  Antimicrobials:    Anti-infectives (From admission, onward)   Start     Dose/Rate Route Frequency Ordered Stop   01/30/21 1800  cefTRIAXone (ROCEPHIN) 1 g in sodium chloride 0.9 % 100 mL IVPB  Status:  Discontinued        1 g 200 mL/hr over 30 Minutes Intravenous Every 24 hours 01/29/21 2348 01/30/21 0826   01/30/21 1000  cefTRIAXone (ROCEPHIN) 2 g in sodium chloride 0.9 % 100 mL IVPB        2 g 200 mL/hr over 30 Minutes Intravenous Every 24 hours 01/30/21 0826     01/29/21 1630  cefTRIAXone (ROCEPHIN) 1 g in sodium chloride  0.9 % 100 mL IVPB        1 g 200 mL/hr over 30 Minutes Intravenous  Once 01/29/21 1621 01/29/21 1855        Subjective:  Patient seen this morning with daughter at bedside.  Still had hard cervical collar on.  Was complaining of neck discomfort and wanting to take the collar off.  Multiple areas of pain including left shoulder.  As per daughter, confused after pain medication earlier in the day.  Objective:   Vitals:   01/30/21 1330 01/30/21 1345 01/30/21 1346 01/30/21 1400  BP: (!) 53/42  (!) 141/125 (!) 139/124  Pulse: 87 87 87 91  Resp: 19 (!) 22 (!) 21 (!) 24  Temp:      TempSrc:      SpO2: 96% 96% 96% 95%  Weight:      Height:        General exam: Elderly female, moderately built and nourished lying uncomfortably supine in bed.  Hard cervical collar in place. Respiratory system: Clear to auscultation. Respiratory effort normal. Cardiovascular system: S1 & S2 heard, RRR. No JVD, murmurs, rubs, gallops or clicks. No pedal edema. Gastrointestinal system: Abdomen is nondistended, soft and nontender. No organomegaly or masses  felt. Normal bowel sounds heard. Central nervous system: Alert and oriented only to self. No focal neurological deficits. Extremities: Right upper extremity with grade 3 x 5 power, left upper extremity grade 2 x 5 power proximally and 3 x 5 power distally.  Bilateral lower extremities grade 2 x 5 power. Skin: No rashes, lesions or ulcers Psychiatry: Judgement and insight impaired. Mood & affect cannot be assessed.     Data Reviewed:   I have personally reviewed following labs and imaging studies   CBC: Recent Labs  Lab 01/25/21 2050 01/29/21 1505 01/30/21 0410  WBC 6.4 10.9* 11.6*  NEUTROABS 3.0 9.4*  --   HGB 10.6* 9.8* 8.8*  HCT 31.7* 29.0* 25.3*  MCV 91.1 91.2 87.5  PLT 227 175 138*    Basic Metabolic Panel: Recent Labs  Lab 01/25/21 2050 01/29/21 1505 01/30/21 0410  NA 135 124* 124*  K 4.2 5.0 4.7  CL 103 94* 93*  CO2 22 19* 16*   GLUCOSE 152* 208* 142*  BUN 40* 67* 72*  CREATININE 1.27* 2.47* 2.75*  CALCIUM 8.7* 8.1* 7.7*    Liver Function Tests: Recent Labs  Lab 01/25/21 2050  AST 22  ALT 16  ALKPHOS 56  BILITOT 0.3  PROT 6.4*  ALBUMIN 3.5    CBG: Recent Labs  Lab 01/30/21 0330 01/30/21 0805 01/30/21 1107  GLUCAP 144* 125* 126*    Microbiology Studies:   Recent Results (from the past 240 hour(s))  Resp Panel by RT-PCR (Flu A&B, Covid) Nasopharyngeal Swab     Status: None   Collection Time: 01/26/21  3:38 PM   Specimen: Nasopharyngeal Swab; Nasopharyngeal(NP) swabs in vial transport medium  Result Value Ref Range Status   SARS Coronavirus 2 by RT PCR NEGATIVE NEGATIVE Final    Comment: (NOTE) SARS-CoV-2 target nucleic acids are NOT DETECTED.  The SARS-CoV-2 RNA is generally detectable in upper respiratory specimens during the acute phase of infection. The lowest concentration of SARS-CoV-2 viral copies this assay can detect is 138 copies/mL. A negative result does not preclude SARS-Cov-2 infection and should not be used as the sole basis for treatment or other patient management decisions. A negative result may occur with  improper specimen collection/handling, submission of specimen other than nasopharyngeal swab, presence of viral mutation(s) within the areas targeted by this assay, and inadequate number of viral copies(<138 copies/mL). A negative result must be combined with clinical observations, patient history, and epidemiological information. The expected result is Negative.  Fact Sheet for Patients:  EntrepreneurPulse.com.au  Fact Sheet for Healthcare Providers:  IncredibleEmployment.be  This test is no t yet approved or cleared by the [place] FDA and  has been authorized for detection and/or diagnosis of SARS-CoV-2 by FDA under an Emergency Use Authorization (EUA). This EUA will remain  in effect (meaning this test can be used) for  the duration of the COVID-19 declaration under Section 564(b)(1) of the Act, 21 U.S.C.section 360bbb-3(b)(1), unless the authorization is terminated  or revoked sooner.       Influenza A by PCR NEGATIVE NEGATIVE Final   Influenza B by PCR NEGATIVE NEGATIVE Final    Comment: (NOTE) The Xpert Xpress SARS-CoV-2/FLU/RSV plus assay is intended as an aid in the diagnosis of influenza from Nasopharyngeal swab specimens and should not be used as a sole basis for treatment. Nasal washings and aspirates are unacceptable for Xpert Xpress SARS-CoV-2/FLU/RSV testing.  Fact Sheet for Patients: EntrepreneurPulse.com.au  Fact Sheet for Healthcare Providers: IncredibleEmployment.be  This test is not yet  approved or cleared by the [place] and has been authorized for detection and/or diagnosis of SARS-CoV-2 by FDA under an Emergency Use Authorization (EUA). This EUA will remain in effect (meaning this test can be used) for the duration of the COVID-19 declaration under Section 564(b)(1) of the Act, 21 U.S.C. section 360bbb-3(b)(1), unless the authorization is terminated or revoked.  Performed at [facility], [address] [place], [place] [address]   Culture, blood (Routine X 2) w Reflex to ID Panel     Status: None (Preliminary result)   Collection Time: 01/29/21  5:30 PM   Specimen: BLOOD RIGHT ARM  Result Value Ref Range Status   Specimen Description BLOOD RIGHT ARM  Final   Special Requests   Final    BOTTLES DRAWN AEROBIC AND ANAEROBIC Blood Culture adequate volume   Culture  Setup Time   Final    GRAM NEGATIVE RODS AEROBIC AND ANAEROBIC BOTTLES Gram Stain Report Called to,Read Back By and Verified With: EVernona Rieger (PHARMACY @ [facility]) 01/30/21 @ 0819 BY S BEARD Performed at [facility], [address] [place], [place] [address]    Culture PENDING  Incomplete   Report Status PENDING  Incomplete  Culture, blood (Routine X 2) w Reflex to ID  Panel     Status: None (Preliminary result)   Collection Time: 01/29/21  5:30 PM   Specimen: BLOOD LEFT ARM  Result Value Ref Range Status   Specimen Description   Final    BLOOD LEFT ARM Performed at [facility], [address] [place], [place] [address]    Special Requests   Final    BOTTLES DRAWN AEROBIC AND ANAEROBIC Blood Culture adequate volume Performed at [facility], [address], [place], [place] [address]    Culture  Setup Time   Final    GRAM NEGATIVE RODS BOTH AEROBIC AND ANAEROBIC BOTTLES Gram Stain Report Called to,Read Back By and Verified With: E. SINCLAIR @ [facility] 01/30/21 @ 0819 BY S BEARD Organism ID to follow Performed at [facility], [place] [address] [place], [place] [address]    Culture PENDING  Incomplete   Report Status PENDING  Incomplete  Blood Culture ID Panel (Reflexed)     Status: Abnormal   Collection Time: 01/29/21  5:30 PM  Result Value Ref Range Status   Enterococcus faecalis NOT DETECTED NOT DETECTED Final   Enterococcus Faecium NOT DETECTED NOT DETECTED Final   Listeria monocytogenes NOT DETECTED NOT DETECTED Final   Staphylococcus species NOT DETECTED NOT DETECTED Final   Staphylococcus aureus (BCID) NOT DETECTED NOT DETECTED Final   Staphylococcus epidermidis NOT DETECTED NOT DETECTED Final   Staphylococcus lugdunensis NOT DETECTED NOT DETECTED Final   Streptococcus species NOT DETECTED NOT DETECTED Final   Streptococcus agalactiae NOT DETECTED NOT DETECTED Final   Streptococcus pneumoniae NOT DETECTED NOT DETECTED Final   Streptococcus pyogenes NOT DETECTED NOT DETECTED Final   A.calcoaceticus-baumannii NOT DETECTED NOT DETECTED Final   Bacteroides fragilis NOT DETECTED NOT DETECTED Final   Enterobacterales DETECTED (A) NOT DETECTED Final    Comment: Enterobacterales represent a large order of gram negative bacteria, not a single organism. CRITICAL RESULT CALLED TO, READ BACK BY AND VERIFIED WITH: EMILY SINCLAIR AT 1404 ON  01/30/21 [place] KJ    Enterobacter cloacae complex NOT DETECTED NOT DETECTED Final   Escherichia coli NOT DETECTED NOT DETECTED Final   Klebsiella aerogenes NOT DETECTED NOT DETECTED Final   Klebsiella oxytoca NOT DETECTED NOT DETECTED Final  Klebsiella pneumoniae DETECTED (A) NOT DETECTED Final    Comment: CRITICAL RESULT CALLED TO, READ BACK BY AND VERIFIED WITH: PHARMD EMILY SINCLAIR AT 1404 ON 01/30/21 BY KJ    Proteus species NOT DETECTED NOT DETECTED Final   Salmonella species NOT DETECTED NOT DETECTED Final   Serratia marcescens NOT DETECTED NOT DETECTED Final   Haemophilus influenzae NOT DETECTED NOT DETECTED Final   Neisseria meningitidis NOT DETECTED NOT DETECTED Final   Pseudomonas aeruginosa NOT DETECTED NOT DETECTED Final   Stenotrophomonas maltophilia NOT DETECTED NOT DETECTED Final   Candida albicans NOT DETECTED NOT DETECTED Final   Candida auris NOT DETECTED NOT DETECTED Final   Candida glabrata NOT DETECTED NOT DETECTED Final   Candida krusei NOT DETECTED NOT DETECTED Final   Candida parapsilosis NOT DETECTED NOT DETECTED Final   Candida tropicalis NOT DETECTED NOT DETECTED Final   Cryptococcus neoformans/gattii NOT DETECTED NOT DETECTED Final   CTX-M ESBL NOT DETECTED NOT DETECTED Final   Carbapenem resistance IMP NOT DETECTED NOT DETECTED Final   Carbapenem resistance KPC NOT DETECTED NOT DETECTED Final   Carbapenem resistance NDM NOT DETECTED NOT DETECTED Final   Carbapenem resist OXA 48 LIKE NOT DETECTED NOT DETECTED Final   Carbapenem resistance VIM NOT DETECTED NOT DETECTED Final    Comment: Performed at [facility], [address] [place], [place] [address]  Resp Panel by RT-PCR (Flu A&B, Covid) Nasopharyngeal Swab     Status: None   Collection Time: 01/29/21  7:18 PM   Specimen: Nasopharyngeal Swab; Nasopharyngeal(NP) swabs in vial transport medium  Result Value Ref Range Status   SARS Coronavirus 2 by RT PCR NEGATIVE NEGATIVE Final    Comment:  (NOTE) SARS-CoV-2 target nucleic acids are NOT DETECTED.  The SARS-CoV-2 RNA is generally detectable in upper respiratory specimens during the acute phase of infection. The lowest concentration of SARS-CoV-2 viral copies this assay can detect is 138 copies/mL. A negative result does not preclude SARS-Cov-2 infection and should not be used as the sole basis for treatment or other patient management decisions. A negative result may occur with  improper specimen collection/handling, submission of specimen other than nasopharyngeal swab, presence of viral mutation(s) within the areas targeted by this assay, and inadequate number of viral copies(<138 copies/mL). A negative result must be combined with clinical observations, patient history, and epidemiological information. The expected result is Negative.  Fact Sheet for Patients:  EntrepreneurPulse.com.au  Fact Sheet for Healthcare Providers:  IncredibleEmployment.be  This test is no t yet approved or cleared by the [place] FDA and  has been authorized for detection and/or diagnosis of SARS-CoV-2 by FDA under an Emergency Use Authorization (EUA). This EUA will remain  in effect (meaning this test can be used) for the duration of the COVID-19 declaration under Section 564(b)(1) of the Act, 21 U.S.C.section 360bbb-3(b)(1), unless the authorization is terminated  or revoked sooner.       Influenza A by PCR NEGATIVE NEGATIVE Final   Influenza B by PCR NEGATIVE NEGATIVE Final    Comment: (NOTE) The Xpert Xpress SARS-CoV-2/FLU/RSV plus assay is intended as an aid in the diagnosis of influenza from Nasopharyngeal swab specimens and should not be used as a sole basis for treatment. Nasal washings and aspirates are unacceptable for Xpert Xpress SARS-CoV-2/FLU/RSV testing.  Fact Sheet for Patients: EntrepreneurPulse.com.au  Fact Sheet for Healthcare  Providers: IncredibleEmployment.be  This test is not yet approved or cleared by the [place] FDA and has been authorized for detection and/or  diagnosis of SARS-CoV-2 by FDA under an Emergency Use Authorization (EUA). This EUA will remain in effect (meaning this test can be used) for the duration of the COVID-19 declaration under Section 564(b)(1) of the Act, 21 U.S.C. section 360bbb-3(b)(1), unless the authorization is terminated or revoked.  Performed at [facility], [address], [place], [place] [address]      Radiology Studies:  MR Cervical Spine Wo Contrast  Result Date: 01/29/2021 CLINICAL DATA:  Bilateral arm weakness, recent fall EXAM: MRI CERVICAL, THORACIC AND LUMBAR SPINE WITHOUT CONTRAST TECHNIQUE: Multiplanar and multiecho pulse sequences of the cervical spine, to include the craniocervical junction and cervicothoracic junction, and thoracic and lumbar spine, were obtained without intravenous contrast. COMPARISON:  MRI lumbar spine 12/02/2020 FINDINGS: MRI CERVICAL SPINE Alignment: Approximately 4 mm retrolisthesis at C3-C4. Vertebrae: Prior anterior fusion at C4-C6. Appears to be solid bridging bone. Hardware is not well evaluated and there is associated susceptibility artifact. Vertebral body heights are maintained apart from degenerative endplate irregularity at C3-C4 and C6-C7. No substantial marrow edema. No suspicious osseous lesion. Cord: Cord is compressed at C3-C4. There is abnormal cord signal at approximately C4 and C5 levels. Posterior Fossa, vertebral arteries, paraspinal tissues: Unremarkable. Disc levels: C2-C3: Right greater than left uncovertebral and facet hypertrophy. No canal stenosis. Mild to moderate foraminal stenosis, greater on the right. C3-C4: Disc bulge with endplate osteophytes. Uncovertebral hypertrophy. Severe canal stenosis with cord compression. Moderate to marked foraminal stenosis. C4-C5:  Fusion level.  No canal or foraminal  stenosis. C5-C6: Fusion level. No canal stenosis. Spurring may mildly narrow the neural foramina. C6-C7: Disc bulge with endplate osteophytes. Uncovertebral and facet hypertrophy. Mild canal stenosis. Mild to moderate foraminal stenosis. C7-T1: Endplate osteophytes and facet hypertrophy. No canal or foraminal stenosis. MRI THORACIC SPINE Alignment:  Preserved. Vertebrae: Vertebral body heights are maintained. There is no substantial marrow edema. No suspicious osseous lesion. Cord:  No abnormal signal. Paraspinal and other soft tissues: Unremarkable. Disc levels: Intervertebral disc heights and signal are maintained. Minor degenerative changes are present without stenosis. MRI LUMBAR SPINE Segmentation:  Standard. Alignment:  Stable with trace anterolisthesis at L4-L5. Vertebrae: Stable vertebral body heights. No substantial marrow edema. No suspicious osseous lesion. Conus medullaris and cauda equina: Conus extends to the L1-L2 level. Conus and cauda equina appear normal. Paraspinal and other soft tissues: Unremarkable. Disc levels: L1-L2:  No stenosis. L2-L3:  Facet arthropathy.  No stenosis. L3-L4: Disc bulge. Facet arthropathy. No canal or right foraminal stenosis. Mild left foraminal stenosis. L4-L5: Disc bulge slightly eccentric to the right. Facet arthropathy. Minor canal stenosis with slight effacement of the subarticular recesses. Mild right foraminal stenosis. No left foraminal stenosis. L5-S1: Disc bulge with endplate osteophytic ridging eccentric to the left. Facet arthropathy. No canal stenosis. Partial effacement of the left subarticular recess. No right foraminal stenosis. Moderate to marked left foraminal stenosis. IMPRESSION: Postoperative and multilevel degenerative changes of the cervical spine. There is severe canal stenosis with cord compression at C3-C4. Abnormal cord signal is present just below this level (at operative levels) and may reflect edema or myelomalacia No significant degenerative  changes of the thoracic spine. Multilevel degenerative changes of lumbar spine similar to recent prior study. These results were called by telephone at the time of interpretation on 01/29/2021 at 5:20 pm to provider Dr. Rogene Houston, who verbally acknowledged these results. Electronically Signed   By: Macy Mis M.D.   On: 01/29/2021 17:38   MR THORACIC SPINE WO CONTRAST  Result Date: 01/29/2021 CLINICAL DATA:  Bilateral arm weakness, recent fall EXAM: MRI CERVICAL, THORACIC AND LUMBAR SPINE WITHOUT CONTRAST TECHNIQUE: Multiplanar and multiecho pulse sequences of the cervical spine, to include the craniocervical junction and cervicothoracic junction, and thoracic and lumbar spine, were obtained without intravenous contrast. COMPARISON:  MRI lumbar spine 12/02/2020 FINDINGS: MRI CERVICAL SPINE Alignment: Approximately 4 mm retrolisthesis at C3-C4. Vertebrae: Prior anterior fusion at C4-C6. Appears to be solid bridging bone. Hardware is not well evaluated and there is associated susceptibility artifact. Vertebral body heights are maintained apart from degenerative endplate irregularity at C3-C4 and C6-C7. No substantial marrow edema. No suspicious osseous lesion. Cord: Cord is compressed at C3-C4. There is abnormal cord signal at approximately C4 and C5 levels. Posterior Fossa, vertebral arteries, paraspinal tissues: Unremarkable. Disc levels: C2-C3: Right greater than left uncovertebral and facet hypertrophy. No canal stenosis. Mild to moderate foraminal stenosis, greater on the right. C3-C4: Disc bulge with endplate osteophytes. Uncovertebral hypertrophy. Severe canal stenosis with cord compression. Moderate to marked foraminal stenosis. C4-C5:  Fusion level.  No canal or foraminal stenosis. C5-C6: Fusion level. No canal stenosis. Spurring may mildly narrow the neural foramina. C6-C7: Disc bulge with endplate osteophytes. Uncovertebral and facet hypertrophy. Mild canal stenosis. Mild to moderate foraminal  stenosis. C7-T1: Endplate osteophytes and facet hypertrophy. No canal or foraminal stenosis. MRI THORACIC SPINE Alignment:  Preserved. Vertebrae: Vertebral body heights are maintained. There is no substantial marrow edema. No suspicious osseous lesion. Cord:  No abnormal signal. Paraspinal and other soft tissues: Unremarkable. Disc levels: Intervertebral disc heights and signal are maintained. Minor degenerative changes are present without stenosis. MRI LUMBAR SPINE Segmentation:  Standard. Alignment:  Stable with trace anterolisthesis at L4-L5. Vertebrae: Stable vertebral body heights. No substantial marrow edema. No suspicious osseous lesion. Conus medullaris and cauda equina: Conus extends to the L1-L2 level. Conus and cauda equina appear normal. Paraspinal and other soft tissues: Unremarkable. Disc levels: L1-L2:  No stenosis. L2-L3:  Facet arthropathy.  No stenosis. L3-L4: Disc bulge. Facet arthropathy. No canal or right foraminal stenosis. Mild left foraminal stenosis. L4-L5: Disc bulge slightly eccentric to the right. Facet arthropathy. Minor canal stenosis with slight effacement of the subarticular recesses. Mild right foraminal stenosis. No left foraminal stenosis. L5-S1: Disc bulge with endplate osteophytic ridging eccentric to the left. Facet arthropathy. No canal stenosis. Partial effacement of the left subarticular recess. No right foraminal stenosis. Moderate to marked left foraminal stenosis. IMPRESSION: Postoperative and multilevel degenerative changes of the cervical spine. There is severe canal stenosis with cord compression at C3-C4. Abnormal cord signal is present just below this level (at operative levels) and may reflect edema or myelomalacia No significant degenerative changes of the thoracic spine. Multilevel degenerative changes of lumbar spine similar to recent prior study. These results were called by telephone at the time of interpretation on 01/29/2021 at 5:20 pm to provider Dr. Rogene Houston,  who verbally acknowledged these results. Electronically Signed   By: Macy Mis M.D.   On: 01/29/2021 17:38   MR LUMBAR SPINE WO CONTRAST  Result Date: 01/29/2021 CLINICAL DATA:  Bilateral arm weakness, recent fall EXAM: MRI CERVICAL, THORACIC AND LUMBAR SPINE WITHOUT CONTRAST TECHNIQUE: Multiplanar and multiecho pulse sequences of the cervical spine, to include the craniocervical junction and cervicothoracic junction, and thoracic and lumbar spine, were obtained without intravenous contrast. COMPARISON:  MRI lumbar spine 12/02/2020 FINDINGS: MRI CERVICAL SPINE Alignment: Approximately 4 mm retrolisthesis at C3-C4. Vertebrae: Prior anterior fusion at C4-C6. Appears to be solid bridging bone. Hardware is not well evaluated and there  is associated susceptibility artifact. Vertebral body heights are maintained apart from degenerative endplate irregularity at C3-C4 and C6-C7. No substantial marrow edema. No suspicious osseous lesion. Cord: Cord is compressed at C3-C4. There is abnormal cord signal at approximately C4 and C5 levels. Posterior Fossa, vertebral arteries, paraspinal tissues: Unremarkable. Disc levels: C2-C3: Right greater than left uncovertebral and facet hypertrophy. No canal stenosis. Mild to moderate foraminal stenosis, greater on the right. C3-C4: Disc bulge with endplate osteophytes. Uncovertebral hypertrophy. Severe canal stenosis with cord compression. Moderate to marked foraminal stenosis. C4-C5:  Fusion level.  No canal or foraminal stenosis. C5-C6: Fusion level. No canal stenosis. Spurring may mildly narrow the neural foramina. C6-C7: Disc bulge with endplate osteophytes. Uncovertebral and facet hypertrophy. Mild canal stenosis. Mild to moderate foraminal stenosis. C7-T1: Endplate osteophytes and facet hypertrophy. No canal or foraminal stenosis. MRI THORACIC SPINE Alignment:  Preserved. Vertebrae: Vertebral body heights are maintained. There is no substantial marrow edema. No suspicious  osseous lesion. Cord:  No abnormal signal. Paraspinal and other soft tissues: Unremarkable. Disc levels: Intervertebral disc heights and signal are maintained. Minor degenerative changes are present without stenosis. MRI LUMBAR SPINE Segmentation:  Standard. Alignment:  Stable with trace anterolisthesis at L4-L5. Vertebrae: Stable vertebral body heights. No substantial marrow edema. No suspicious osseous lesion. Conus medullaris and cauda equina: Conus extends to the L1-L2 level. Conus and cauda equina appear normal. Paraspinal and other soft tissues: Unremarkable. Disc levels: L1-L2:  No stenosis. L2-L3:  Facet arthropathy.  No stenosis. L3-L4: Disc bulge. Facet arthropathy. No canal or right foraminal stenosis. Mild left foraminal stenosis. L4-L5: Disc bulge slightly eccentric to the right. Facet arthropathy. Minor canal stenosis with slight effacement of the subarticular recesses. Mild right foraminal stenosis. No left foraminal stenosis. L5-S1: Disc bulge with endplate osteophytic ridging eccentric to the left. Facet arthropathy. No canal stenosis. Partial effacement of the left subarticular recess. No right foraminal stenosis. Moderate to marked left foraminal stenosis. IMPRESSION: Postoperative and multilevel degenerative changes of the cervical spine. There is severe canal stenosis with cord compression at C3-C4. Abnormal cord signal is present just below this level (at operative levels) and may reflect edema or myelomalacia No significant degenerative changes of the thoracic spine. Multilevel degenerative changes of lumbar spine similar to recent prior study. These results were called by telephone at the time of interpretation on 01/29/2021 at 5:20 pm to provider Dr. Rogene Houston, who verbally acknowledged these results. Electronically Signed   By: Macy Mis M.D.   On: 01/29/2021 17:38   US RENAL  Result Date: 01/30/2021 CLINICAL DATA:  Acute kidney injury. EXAM: RENAL / URINARY TRACT ULTRASOUND COMPLETE  COMPARISON:  CT abdomen pelvis dated Jan 26, 2021. FINDINGS: Right Kidney: Renal measurements: 10.6 x 5.1 x 5.6 cm = volume: 157 mL. Echogenicity within normal limits. No mass or hydronephrosis visualized. Small amount of perinephric fluid. Left Kidney: Renal measurements: 10.4 x 6.2 x 7.0 cm = volume: 232 mL. Echogenicity within normal limits. No mass or hydronephrosis visualized. Bladder: Decompressed by Foley catheter. Other: None. IMPRESSION: 1. No acute abnormality. Small amount of right perinephric fluid, nonspecific. Electronically Signed   By: Titus Dubin M.D.   On: 01/30/2021 11:15     Scheduled Meds:   . atorvastatin  20 mg Oral QPM  . Chlorhexidine Gluconate Cloth  6 each Topical Daily  . gabapentin  300 mg Oral BID  . insulin aspart  0-15 Units Subcutaneous Q4H  . pantoprazole  40 mg Oral Daily    Continuous Infusions:   .  sodium chloride Stopped (01/30/21 1325)  . sodium chloride    . cefTRIAXone (ROCEPHIN)  IV Stopped (01/30/21 0950)  . lactated ringers 125 mL/hr at 01/30/21 1400  . norepinephrine (LEVOPHED) Adult infusion       LOS: 1 day     Vernell Leep, MD, [place], [facility]. [facility]    To contact the attending provider between 7A-7P or the covering provider during after hours 7P-7A, please log into the web site [email or web address] and access using universal [place] password for that web site. If you do not have the password, please call the hospital operator.  01/30/2021, 2:33 PM

## 2021-01-30 NOTE — Plan of Care (Signed)

## 2021-01-30 NOTE — Progress Notes (Signed)
Spoke with bedside Rn about getting lactic acid drawn ASAP

## 2021-01-30 NOTE — Progress Notes (Signed)
Notified provider of need to order fluid bolus, pt needs 2244 cc fluid.

## 2021-01-30 NOTE — Progress Notes (Signed)
Elink following for code sepsis 

## 2021-01-30 NOTE — Consult Note (Addendum)
NAME:  [name], MRN:  [id number], DOB:  09/29/1935, LOS: 1 ADMISSION DATE:  01/26/2021, CONSULTATION DATE:  01/30/2021 REFERRING MD:  Dr. Algis Liming, CHIEF COMPLAINT:  Fall, weakness    History of Present Illness:  [name] is a 85 y.o. female with a PMX significant for neuropathy, neck pain, HLD, and diabetes who present to [facility] on 01/26/2021 after a fall down 2 stairs.  Imaging in ED showing no fractures. CT on 5/29 showing disc space narrowing and endplate irregularity and osteophyte at C3-C4. MRI on 6/1 showing severe canal stenosis with cord compression at C3-C4. Labs showing Acute on CKD and hyponatremia. UA suggestive of UTI. Started on Ceftriaxone. Neurosurgeon called.  Patient transferred to [facility] on 6/2. Patient arrived hypotensive and AMS. PCCM consulted for management of hypotension. Will admit to 4n ICU.  Pertinent  Medical History   Past Medical History:  Diagnosis Date  . Diabetes mellitus without complication ([place])   . High cholesterol   . Neck pain   . Neuropathy      Significant Hospital Events: Including procedures, antibiotic start and stop dates in addition to other pertinent events   . 5/29: Admitted to [facility] [place] [facility] for fall . 6/2: Transferred to [facility] and will admit to 4N ICU due to hypotension. IV fluids and bolus's given.  Interim History / Subjective:   Patient lying flat without C-collar. Cleared by neurosurgery to take off per nursing. Patient somnolent but arousable and able to follow commands. Weak UE and LE. BP soft; currently on IV fluids 150 cc/hr  Objective   Blood pressure (!) 80/42, pulse 84, temperature 98.4 F (36.9 C), temperature source Oral, resp. rate 19, height '5\' 2"'$  (1.575 m), weight 74.8 kg, SpO2 95 %.        Intake/Output Summary (Last 24 hours) at 01/30/2021 1141 Last data filed at 01/30/2021 0900 Gross per 24 hour  Intake 442.07 ml  Output 350 ml  Net 92.07 ml   Filed Weights   01/26/21 0848  Weight: 74.8  kg    Examination: General: ill appearing 85 yo female. NAD HEENT: MM pink/moist; PERRL Neuro: weak bilaterally UE and LE; able to follow commands and stick out tongue CV: s1s2, no m/r/g PULM:  BS dim clear bilaterally on RA GI: soft, bsx4 active  GU: foley in place; yellow urine Extremities: warm/dry, no edema  Skin: no rashes or lesions appreciated   Labs/imaging that I havepersonally reviewed  (right click and "Reselect all SmartList Selections" daily)   Na 124 CO2 16 BUN 72, 2.75, GFR 17 WBC 11.6 from 10.9 Hgb 8.8 from 9.8  BCX4: Gram negative rods  CT cervical spine 5/28: Multilevel degenerative changes. There is disc space narrowing and endplate irregularity and osteophyte at C3-C4. C4-C6 ACDF.  CT abdomen/pelvis 5/29: Moderate disc space height loss and osteophytosis at L5-S1. Probable small broad-based posterior disc bulges at L4-L5 and L5-S1. Distended urinary bladder. Mild bilateral hydronephrosis.  6/2 renal US: no hydronephrosis  MRI cervical/lumbar spine 6/1: severe canal stenosis with cord compression at C3-C4. Abnormal cord signal is present just below this level (at operative levels) and may reflect edema or myelomalacia  Urinalysis 6/1: large leukocytes with many bacteria  Resolved Hospital Problem list     Assessment & Plan:   Shock multifactorial: UTI, AMS, Cord compression, likely hypovolemic Plan: -admit to ICU for continuous telemetry monitoring -Continue IV fluids -Will give LR bolus -BCX4 at [facility] showing Gram neg rods -Continue Ceftriaxone -Prn pressors  for MAP > 65 -Will check lactic acid and procalcitonin  Mild encephalopathy multifactorial Plan: -Avoid sedative medications; holding home gabapentin and remeron  UTI Bacteremia: BC showing Gram neg rods Plan: -Continue Ceftriaxone -Trend CBC/fever curve -Follow cultures  Cord Compression: MRI 6/1: severe canal stenosis with cord compression at C3-C4. Plan: -Neurosurgery  consulted  Hyponatremia: likely hypovolemic Plan: -Nephrology following -Continue IV fluids -Consider checking Urine osmolaltity and urine sodium -Trend BMP  Acute on CKD 3 Plan: -Nephrology following -IV fluids -Keep foley -trend BMP/UO -Avoid nephrotoxic agents/renal dose drugs  Hx of DM Plan: -SSI and CBG monitoring  Hx of HTN, HLD Plan: -Hold home meds while hypotensive -Continue statin  Best practice (right click and "Reselect all SmartList Selections" daily)  Diet:  NPO Pain/Anxiety/Delirium protocol (if indicated): No VAP protocol (if indicated): Not indicated DVT prophylaxis: SCD GI prophylaxis: PPI Glucose control:  SSI Yes Central venous access:  N/A Arterial line:  N/A Foley:  Yes, and it is still needed Mobility:  bed rest  PT consulted: N/A Last date of multidisciplinary goals of care discussion [pending] Code Status:  full code Disposition: ICU  Labs   CBC: Recent Labs  Lab 01/25/21 2050 01/29/21 1505 01/30/21 0410  WBC 6.4 10.9* 11.6*  NEUTROABS 3.0 9.4*  --   HGB 10.6* 9.8* 8.8*  HCT 31.7* 29.0* 25.3*  MCV 91.1 91.2 87.5  PLT 227 175 138*    Basic Metabolic Panel: Recent Labs  Lab 01/25/21 2050 01/29/21 1505 01/30/21 0410  NA 135 124* 124*  K 4.2 5.0 4.7  CL 103 94* 93*  CO2 22 19* 16*  GLUCOSE 152* 208* 142*  BUN 40* 67* 72*  CREATININE 1.27* 2.47* 2.75*  CALCIUM 8.7* 8.1* 7.7*   GFR: Estimated Creatinine Clearance: 14.9 mL/min (A) (by C-G formula based on SCr of 2.75 mg/dL (H)). Recent Labs  Lab 01/25/21 2050 01/29/21 1505 01/29/21 1730 01/30/21 0410  WBC 6.4 10.9*  --  11.6*  LATICACIDVEN  --   --  1.4  --     Liver Function Tests: Recent Labs  Lab 01/25/21 2050  AST 22  ALT 16  ALKPHOS 56  BILITOT 0.3  PROT 6.4*  ALBUMIN 3.5   No results for input(s): LIPASE, AMYLASE in the last 168 hours. No results for input(s): AMMONIA in the last 168 hours.  ABG No results found for: PHART, PCO2ART, PO2ART,  HCO3, TCO2, ACIDBASEDEF, O2SAT   Coagulation Profile: No results for input(s): INR, PROTIME in the last 168 hours.  Cardiac Enzymes: No results for input(s): CKTOTAL, CKMB, CKMBINDEX, TROPONINI in the last 168 hours.  HbA1C: Hgb A1c MFr Bld  Date/Time Value Ref Range Status  12/02/2020 07:11 AM 8.6 (H) 4.8 - 5.6 % Final    Comment:    (NOTE) Pre diabetes:          5.7%-6.4%  Diabetes:              >6.4%  Glycemic control for   <7.0% adults with diabetes   08/17/2019 03:11 PM 10.0 (H) 4.8 - 5.6 % Final    Comment:             Prediabetes: 5.7 - 6.4          Diabetes: >6.4          Glycemic control for adults with diabetes: <7.0     CBG: Recent Labs  Lab 01/29/21 0916 01/29/21 2355 01/30/21 0330 01/30/21 0805 01/30/21 1107  GLUCAP 189* 190* 144* 125* 126*  Review of Systems:   See HPI. Obtained information from chart and nurse at bedside  Past Medical History:  She,  has a past medical history of Diabetes mellitus without complication ([place]), High cholesterol, Neck pain, and Neuropathy.   Surgical History:   Past Surgical History:  Procedure Laterality Date  . ABDOMINAL HYSTERECTOMY    . APPENDECTOMY    . BACK SURGERY    . CERVICAL DISC SURGERY    . HAND SURGERY    . KNEE SURGERY       Social History:   reports that she has never smoked. She has never used smokeless tobacco. She reports that she does not drink alcohol and does not use drugs.   Family History:  Her family history includes Cancer in her mother; Cardiomyopathy in her father.   Allergies Allergies  Allergen Reactions  . Codeine      Home Medications  Prior to Admission medications   Medication Sig Start Date End Date Taking? Authorizing Provider  acetaminophen (TYLENOL) 325 MG tablet Take 2 tablets (650 mg total) by mouth every 6 (six) hours. 12/04/20  Yes Emokpae, Courage, MD  albuterol (VENTOLIN HFA) 108 (90 Base) MCG/ACT inhaler Inhale 1 puff into the lungs every 4 (four) hours as  needed for wheezing or shortness of breath.   Yes [provider]  atorvastatin (LIPITOR) 20 MG tablet Take 1 tablet (20 mg total) by mouth every evening. 12/20/20  Yes Gerlene Fee, NP  chlorthalidone (HYGROTON) 25 MG tablet Take 25 mg by mouth daily. 01/01/21  Yes [provider]  gabapentin (NEURONTIN) 300 MG capsule Take 1 capsule (300 mg total) by mouth 2 (two) times daily. 12/20/20  Yes Gerlene Fee, NP  LANTUS SOLOSTAR 100 UNIT/ML Solostar Pen Inject 30 Units into the skin daily. 12/20/20  Yes Gerlene Fee, NP  metFORMIN (GLUCOPHAGE) 500 MG tablet Take 1 tablet (500 mg total) by mouth 2 (two) times daily with a meal. 12/20/20  Yes Gerlene Fee, NP  mirtazapine (REMERON) 15 MG tablet Take 15 mg by mouth at bedtime. 01/01/21  Yes [provider]  pantoprazole (PROTONIX) 40 MG tablet Take 40 mg by mouth daily.   Yes [provider]  bisacodyl (DULCOLAX) 10 MG suppository Place 1 suppository (10 mg total) rectally daily as needed for moderate constipation. Patient not taking: No sig reported 12/04/20   Roxan Hockey, MD  HYDROcodone-acetaminophen (NORCO/VICODIN) 5-325 MG tablet Take 1 tablet by mouth every 6 (six) hours as needed for severe pain. Patient not taking: No sig reported 01/26/21   Joy, Shawn C, PA-C  lisinopril (ZESTRIL) 10 MG tablet Take 1 tablet (10 mg total) by mouth daily. Patient not taking: No sig reported 12/20/20   Gerlene Fee, NP  methocarbamol (ROBAXIN) 500 MG tablet Take 1 tablet (500 mg total) by mouth 3 (three) times daily. Patient not taking: Reported on 01/26/2021 12/20/20   Gerlene Fee, NP  NON FORMULARY Dist Consistent Carbohydrate    [provider]  omeprazole (PRILOSEC) 20 MG capsule Take 20 mg by mouth daily. Patient not taking: No sig reported    [provider]  senna-docusate (SENOKOT-S) 8.6-50 MG tablet Take 2 tablets by mouth at bedtime. Patient not taking: No sig reported 12/04/20 12/04/21   Roxan Hockey, MD     Critical care time: [place], PA-C [facility] 01/30/2021, 12:08 PM  Please see [email or web address] for pager details.  From 7A-7P if no  response, please call [phone]. After hours, please call ELink [phone].

## 2021-01-30 NOTE — Consult Note (Signed)
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  She presented to the emergency room yesterday with bilateral lower extremity weakness after a fall around 5 days ago for which she had radiological evaluation without any acute osseous lesions but noted to have distended urinary bladder/mild bilateral hydronephrosis/hydroureter with in and out catheterization yielding 1.2 L of urine.  She had an MRI done yesterday that showed cord compression/severe canal stenosis at C3-C4 and has been admitted for additional management.  She has been febrile overnight with initial urinalysis suggestive of UTI.  Her creatinine was elevated at admission-2.5 and today is higher at 2.75 with decreasing urine output (350 cc charted overnight).  She has just had a renal ultrasound done and preliminary images do not suggest hydronephrosis.  She has had significant hypotension overnight/this morning and got saline bolus with ongoing normal saline at 150 cc/h.  She denies any prior history of kidney injury, recurrent urinary tract infections or kidney stones.  She appears to be somewhat somnolent with limited responses to questions.  MAR indicates she was taking lisinopril and chlorthalidone prior to admission and was advised against taking NSAIDs following recent admission 2 months ago for closed fracture of the left proximal fibula.  Past Medical History:  Diagnosis Date  . Diabetes mellitus without complication ([place])   . High cholesterol   . Neck pain   . Neuropathy     Past Surgical History:  Procedure Laterality Date  . ABDOMINAL HYSTERECTOMY    . APPENDECTOMY    . BACK SURGERY    . CERVICAL DISC SURGERY    .  HAND SURGERY    . KNEE SURGERY      Family History  Problem Relation Age of Onset  . Cardiomyopathy Father   . Cancer Mother     Social History:  reports that she has never smoked. She has never used smokeless tobacco. She reports that she does not drink alcohol and does not use drugs.  Allergies:  Allergies  Allergen Reactions  . Codeine     Medications:  Scheduled: . atorvastatin  20 mg Oral QPM  . Chlorhexidine Gluconate Cloth  6 each Topical Daily  . gabapentin  300 mg Oral BID  . insulin aspart  0-15 Units Subcutaneous Q4H  . mirtazapine  15 mg Oral QHS  . pantoprazole  40 mg Oral Daily    BMP Latest Ref Rng & Units 01/30/2021 01/29/2021 01/25/2021  Glucose 70 - 99 mg/dL 142(H) 208(H) 152(H)  BUN 8 - 23 mg/dL 72(H) 67(H) 40(H)  Creatinine 0.44 - 1.00 mg/dL 2.75(H) 2.47(H) 1.27(H)  BUN/Creat Ratio 12 - 28 - - -  Sodium 135 - 145 mmol/L 124(L) 124(L) 135  Potassium 3.5 - 5.1 mmol/L 4.7 5.0 4.2  Chloride 98 - 111 mmol/L 93(L) 94(L) 103  CO2 22 - 32 mmol/L 16(L) 19(L) 22  Calcium 8.9 - 10.3 mg/dL 7.7(L) 8.1(L) 8.7(L)   CBC Latest Ref Rng & Units 01/30/2021 01/29/2021 01/25/2021  WBC 4.0 - 10.5 K/uL 11.6(H) 10.9(H) 6.4  Hemoglobin 12.0 - 15.0 g/dL 8.8(L) 9.8(L) 10.6(L)  Hematocrit 36.0 - 46.0 % 25.3(L) 29.0(L) 31.7(L)  Platelets 150 - 400 K/uL 138(L) 175 227   Urinalysis    Component Value Date/Time   COLORURINE YELLOW  01/29/2021 1435   APPEARANCEUR CLOUDY (A) 01/29/2021 1435   LABSPEC 1.013 01/29/2021 1435   PHURINE 5.0 01/29/2021 1435   GLUCOSEU NEGATIVE 01/29/2021 1435   HGBUR MODERATE (A) 01/29/2021 1435   BILIRUBINUR NEGATIVE 01/29/2021 1435   KETONESUR NEGATIVE 01/29/2021 1435   PROTEINUR NEGATIVE 01/29/2021 1435   NITRITE NEGATIVE 01/29/2021 1435   LEUKOCYTESUR LARGE (A) 01/29/2021 1435   MR Cervical Spine Wo Contrast  Result Date: 01/29/2021 CLINICAL DATA:  Bilateral arm weakness, recent fall EXAM: MRI CERVICAL, THORACIC AND LUMBAR SPINE WITHOUT CONTRAST  TECHNIQUE: Multiplanar and multiecho pulse sequences of the cervical spine, to include the craniocervical junction and cervicothoracic junction, and thoracic and lumbar spine, were obtained without intravenous contrast. COMPARISON:  MRI lumbar spine 12/02/2020 FINDINGS: MRI CERVICAL SPINE Alignment: Approximately 4 mm retrolisthesis at C3-C4. Vertebrae: Prior anterior fusion at C4-C6. Appears to be solid bridging bone. Hardware is not well evaluated and there is associated susceptibility artifact. Vertebral body heights are maintained apart from degenerative endplate irregularity at C3-C4 and C6-C7. No substantial marrow edema. No suspicious osseous lesion. Cord: Cord is compressed at C3-C4. There is abnormal cord signal at approximately C4 and C5 levels. Posterior Fossa, vertebral arteries, paraspinal tissues: Unremarkable. Disc levels: C2-C3: Right greater than left uncovertebral and facet hypertrophy. No canal stenosis. Mild to moderate foraminal stenosis, greater on the right. C3-C4: Disc bulge with endplate osteophytes. Uncovertebral hypertrophy. Severe canal stenosis with cord compression. Moderate to marked foraminal stenosis. C4-C5:  Fusion level.  No canal or foraminal stenosis. C5-C6: Fusion level. No canal stenosis. Spurring may mildly narrow the neural foramina. C6-C7: Disc bulge with endplate osteophytes. Uncovertebral and facet hypertrophy. Mild canal stenosis. Mild to moderate foraminal stenosis. C7-T1: Endplate osteophytes and facet hypertrophy. No canal or foraminal stenosis. MRI THORACIC SPINE Alignment:  Preserved. Vertebrae: Vertebral body heights are maintained. There is no substantial marrow edema. No suspicious osseous lesion. Cord:  No abnormal signal. Paraspinal and other soft tissues: Unremarkable. Disc levels: Intervertebral disc heights and signal are maintained. Minor degenerative changes are present without stenosis. MRI LUMBAR SPINE Segmentation:  Standard. Alignment:  Stable with  trace anterolisthesis at L4-L5. Vertebrae: Stable vertebral body heights. No substantial marrow edema. No suspicious osseous lesion. Conus medullaris and cauda equina: Conus extends to the L1-L2 level. Conus and cauda equina appear normal. Paraspinal and other soft tissues: Unremarkable. Disc levels: L1-L2:  No stenosis. L2-L3:  Facet arthropathy.  No stenosis. L3-L4: Disc bulge. Facet arthropathy. No canal or right foraminal stenosis. Mild left foraminal stenosis. L4-L5: Disc bulge slightly eccentric to the right. Facet arthropathy. Minor canal stenosis with slight effacement of the subarticular recesses. Mild right foraminal stenosis. No left foraminal stenosis. L5-S1: Disc bulge with endplate osteophytic ridging eccentric to the left. Facet arthropathy. No canal stenosis. Partial effacement of the left subarticular recess. No right foraminal stenosis. Moderate to marked left foraminal stenosis. IMPRESSION: Postoperative and multilevel degenerative changes of the cervical spine. There is severe canal stenosis with cord compression at C3-C4. Abnormal cord signal is present just below this level (at operative levels) and may reflect edema or myelomalacia No significant degenerative changes of the thoracic spine. Multilevel degenerative changes of lumbar spine similar to recent prior study. These results were called by telephone at the time of interpretation on 01/29/2021 at 5:20 pm to provider Dr. Rogene Houston, who verbally acknowledged these results. Electronically Signed   By: Macy Mis M.D.   On: 01/29/2021 17:38   MR THORACIC SPINE WO CONTRAST  Result Date: 01/29/2021 CLINICAL DATA:  Bilateral arm weakness, recent fall EXAM: MRI CERVICAL, THORACIC AND LUMBAR SPINE WITHOUT CONTRAST TECHNIQUE: Multiplanar and multiecho pulse sequences of the cervical spine, to include the craniocervical junction and cervicothoracic junction, and thoracic and lumbar spine, were obtained without intravenous contrast. COMPARISON:   MRI lumbar spine 12/02/2020 FINDINGS: MRI CERVICAL SPINE Alignment: Approximately 4 mm retrolisthesis at C3-C4. Vertebrae: Prior anterior fusion at C4-C6. Appears to be solid bridging bone. Hardware is not well evaluated and there is associated susceptibility artifact. Vertebral body heights are maintained apart from degenerative endplate irregularity at C3-C4 and C6-C7. No substantial marrow edema. No suspicious osseous lesion. Cord: Cord is compressed at C3-C4. There is abnormal cord signal at approximately C4 and C5 levels. Posterior Fossa, vertebral arteries, paraspinal tissues: Unremarkable. Disc levels: C2-C3: Right greater than left uncovertebral and facet hypertrophy. No canal stenosis. Mild to moderate foraminal stenosis, greater on the right. C3-C4: Disc bulge with endplate osteophytes. Uncovertebral hypertrophy. Severe canal stenosis with cord compression. Moderate to marked foraminal stenosis. C4-C5:  Fusion level.  No canal or foraminal stenosis. C5-C6: Fusion level. No canal stenosis. Spurring may mildly narrow the neural foramina. C6-C7: Disc bulge with endplate osteophytes. Uncovertebral and facet hypertrophy. Mild canal stenosis. Mild to moderate foraminal stenosis. C7-T1: Endplate osteophytes and facet hypertrophy. No canal or foraminal stenosis. MRI THORACIC SPINE Alignment:  Preserved. Vertebrae: Vertebral body heights are maintained. There is no substantial marrow edema. No suspicious osseous lesion. Cord:  No abnormal signal. Paraspinal and other soft tissues: Unremarkable. Disc levels: Intervertebral disc heights and signal are maintained. Minor degenerative changes are present without stenosis. MRI LUMBAR SPINE Segmentation:  Standard. Alignment:  Stable with trace anterolisthesis at L4-L5. Vertebrae: Stable vertebral body heights. No substantial marrow edema. No suspicious osseous lesion. Conus medullaris and cauda equina: Conus extends to the L1-L2 level. Conus and cauda equina appear  normal. Paraspinal and other soft tissues: Unremarkable. Disc levels: L1-L2:  No stenosis. L2-L3:  Facet arthropathy.  No stenosis. L3-L4: Disc bulge. Facet arthropathy. No canal or right foraminal stenosis. Mild left foraminal stenosis. L4-L5: Disc bulge slightly eccentric to the right. Facet arthropathy. Minor canal stenosis with slight effacement of the subarticular recesses. Mild right foraminal stenosis. No left foraminal stenosis. L5-S1: Disc bulge with endplate osteophytic ridging eccentric to the left. Facet arthropathy. No canal stenosis. Partial effacement of the left subarticular recess. No right foraminal stenosis. Moderate to marked left foraminal stenosis. IMPRESSION: Postoperative and multilevel degenerative changes of the cervical spine. There is severe canal stenosis with cord compression at C3-C4. Abnormal cord signal is present just below this level (at operative levels) and may reflect edema or myelomalacia No significant degenerative changes of the thoracic spine. Multilevel degenerative changes of lumbar spine similar to recent prior study. These results were called by telephone at the time of interpretation on 01/29/2021 at 5:20 pm to provider Dr. Rogene Houston, who verbally acknowledged these results. Electronically Signed   By: Macy Mis M.D.   On: 01/29/2021 17:38   MR LUMBAR SPINE WO CONTRAST  Result Date: 01/29/2021 CLINICAL DATA:  Bilateral arm weakness, recent fall EXAM: MRI CERVICAL, THORACIC AND LUMBAR SPINE WITHOUT CONTRAST TECHNIQUE: Multiplanar and multiecho pulse sequences of the cervical spine, to include the craniocervical junction and cervicothoracic junction, and thoracic and lumbar spine, were obtained without intravenous contrast. COMPARISON:  MRI lumbar spine 12/02/2020 FINDINGS: MRI CERVICAL SPINE Alignment: Approximately 4 mm retrolisthesis at C3-C4. Vertebrae: Prior anterior fusion at C4-C6. Appears to be solid bridging bone. Hardware is not well evaluated and there  is  associated susceptibility artifact. Vertebral body heights are maintained apart from degenerative endplate irregularity at C3-C4 and C6-C7. No substantial marrow edema. No suspicious osseous lesion. Cord: Cord is compressed at C3-C4. There is abnormal cord signal at approximately C4 and C5 levels. Posterior Fossa, vertebral arteries, paraspinal tissues: Unremarkable. Disc levels: C2-C3: Right greater than left uncovertebral and facet hypertrophy. No canal stenosis. Mild to moderate foraminal stenosis, greater on the right. C3-C4: Disc bulge with endplate osteophytes. Uncovertebral hypertrophy. Severe canal stenosis with cord compression. Moderate to marked foraminal stenosis. C4-C5:  Fusion level.  No canal or foraminal stenosis. C5-C6: Fusion level. No canal stenosis. Spurring may mildly narrow the neural foramina. C6-C7: Disc bulge with endplate osteophytes. Uncovertebral and facet hypertrophy. Mild canal stenosis. Mild to moderate foraminal stenosis. C7-T1: Endplate osteophytes and facet hypertrophy. No canal or foraminal stenosis. MRI THORACIC SPINE Alignment:  Preserved. Vertebrae: Vertebral body heights are maintained. There is no substantial marrow edema. No suspicious osseous lesion. Cord:  No abnormal signal. Paraspinal and other soft tissues: Unremarkable. Disc levels: Intervertebral disc heights and signal are maintained. Minor degenerative changes are present without stenosis. MRI LUMBAR SPINE Segmentation:  Standard. Alignment:  Stable with trace anterolisthesis at L4-L5. Vertebrae: Stable vertebral body heights. No substantial marrow edema. No suspicious osseous lesion. Conus medullaris and cauda equina: Conus extends to the L1-L2 level. Conus and cauda equina appear normal. Paraspinal and other soft tissues: Unremarkable. Disc levels: L1-L2:  No stenosis. L2-L3:  Facet arthropathy.  No stenosis. L3-L4: Disc bulge. Facet arthropathy. No canal or right foraminal stenosis. Mild left foraminal stenosis.  L4-L5: Disc bulge slightly eccentric to the right. Facet arthropathy. Minor canal stenosis with slight effacement of the subarticular recesses. Mild right foraminal stenosis. No left foraminal stenosis. L5-S1: Disc bulge with endplate osteophytic ridging eccentric to the left. Facet arthropathy. No canal stenosis. Partial effacement of the left subarticular recess. No right foraminal stenosis. Moderate to marked left foraminal stenosis. IMPRESSION: Postoperative and multilevel degenerative changes of the cervical spine. There is severe canal stenosis with cord compression at C3-C4. Abnormal cord signal is present just below this level (at operative levels) and may reflect edema or myelomalacia No significant degenerative changes of the thoracic spine. Multilevel degenerative changes of lumbar spine similar to recent prior study. These results were called by telephone at the time of interpretation on 01/29/2021 at 5:20 pm to provider Dr. Rogene Houston, who verbally acknowledged these results. Electronically Signed   By: Macy Mis M.D.   On: 01/29/2021 17:38    Review of Systems  Constitutional: Positive for appetite change, diaphoresis, fatigue and fever.  HENT: Negative for sinus pressure, sinus pain and trouble swallowing.   Eyes: Negative for photophobia and visual disturbance.  Respiratory: Negative for chest tightness, shortness of breath and wheezing.   Cardiovascular: Negative for chest pain and leg swelling.  Gastrointestinal: Negative for abdominal pain, diarrhea, nausea and vomiting.  Genitourinary: Negative for dysuria, flank pain and hematuria.  Musculoskeletal: Positive for back pain and gait problem.  Neurological: Positive for weakness.   Blood pressure (!) 83/45, pulse 88, temperature 99.4 F (37.4 C), resp. rate 20, height '5\' 2"'$  (1.575 m), weight 74.8 kg, SpO2 95 %. Physical Exam Vitals and nursing note reviewed.  Constitutional:      Appearance: She is ill-appearing.     Comments:  Awakens to calling out her name with sporadic responses  HENT:     Head: Normocephalic.     Comments: Dressings noted around neck/trapezius areas  Nose: Nose normal.     Mouth/Throat:     Mouth: Mucous membranes are dry.     Pharynx: Oropharynx is clear.  Eyes:     Extraocular Movements: Extraocular movements intact.     Conjunctiva/sclera: Conjunctivae normal.  Cardiovascular:     Rate and Rhythm: Normal rate and regular rhythm.     Pulses: Normal pulses.     Heart sounds: No murmur heard.   Pulmonary:     Effort: Pulmonary effort is normal.     Breath sounds: Normal breath sounds. No wheezing or rales.  Abdominal:     General: Abdomen is flat.     Palpations: Abdomen is soft.     Tenderness: There is no abdominal tenderness. There is no guarding.  Musculoskeletal:     Right lower leg: No edema.     Left lower leg: No edema.  Skin:    General: Skin is warm and dry.  Neurological:     Mental Status: She is disoriented.    Assessment/Plan: 1.  Acute kidney injury on chronic kidney disease stage III: This appears to be possibly from 2 insults; likely initiated by obstruction and now exacerbated by hypotension with likely ischemic ATN.  She is oliguric overnight and I agree with intravenous fluids to try and improve blood pressure/renal perfusion and possibly renal function.  I will supplement current normal saline with 25% albumin to assist efforts at intravascular volume expansion-continue to hold antihypertensive therapy while maintaining a low threshold for transfer to ICU for pressors if fluid resuscitation unsuccessful.  I will send off for urine electrolytes to corroborate clinical suspicion and await the formal report from her renal ultrasound. initial urinalysis suggestive of UTI for which she has been started on ceftriaxone pending urine cultures. Avoid nephrotoxic medications including NSAIDs and iodinated intravenous contrast exposure unless the latter is absolutely  indicated.  Preferred narcotic agents for pain control are hydromorphone, fentanyl, and methadone. Morphine should not be used. Avoid Baclofen and avoid oral sodium phosphate and magnesium citrate based laxatives / bowel preps. Continue strict Input and Output monitoring. Will monitor the patient closely with you and intervene or adjust therapy as indicated by changes in clinical status/labs.  2.  Hyponatremia: Likely secondary to impaired free water handling in the setting of acute kidney injury.  Monitor with isotonic fluids and limit oral intake to <1.2 L a day. 3.  Anion gap metabolic acidosis: Secondary to acute kidney injury, will switch fluids to isotonic saline. 4.  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 5.  Urinary tract infection: With early markers of sepsis-fever/hypotension; started on intravenous ceftriaxone.  Jazzalyn Loewenstein K. 01/30/2021, 11:02 AM

## 2021-01-30 NOTE — Procedures (Signed)
Arterial Catheter Insertion Procedure Note  [name]  IS:[id number]  Jan 13, 1936  Date:01/30/21  Time:2:57 PM    Provider Performing: Esperanza Sheets T    Procedure: Insertion of Arterial Line [phone]) without US guidance  Indication(s) Blood pressure monitoring and/or need for frequent ABGs  Consent Unable to obtain consent due to emergent nature of procedure.  Anesthesia None   Time Out Verified patient identification, verified procedure, site/side was marked, verified correct patient position, special equipment/implants available, medications/allergies/relevant history reviewed, required imaging and test results available.   Sterile Technique Maximal sterile technique including full sterile barrier drape, hand hygiene, sterile gown, sterile gloves, mask, hair covering, sterile ultrasound probe cover (if used).   Procedure Description Area of catheter insertion was cleaned with chlorhexidine and draped in sterile fashion. Without real-time ultrasound guidance an arterial catheter was placed into the left radial artery.  Appropriate arterial tracings confirmed on monitor.     Complications/Tolerance None; patient tolerated the procedure well.   EBL Minimal   Specimen(s) None

## 2021-01-31 ENCOUNTER — Inpatient Hospital Stay (HOSPITAL_COMMUNITY): Payer: Medicare HMO

## 2021-01-31 ENCOUNTER — Encounter (HOSPITAL_COMMUNITY): Payer: Self-pay | Admitting: Internal Medicine

## 2021-01-31 DIAGNOSIS — A414 Sepsis due to anaerobes: Secondary | ICD-10-CM

## 2021-01-31 DIAGNOSIS — R7881 Bacteremia: Secondary | ICD-10-CM

## 2021-01-31 DIAGNOSIS — N3000 Acute cystitis without hematuria: Secondary | ICD-10-CM | POA: Diagnosis not present

## 2021-01-31 DIAGNOSIS — E872 Acidosis, unspecified: Secondary | ICD-10-CM

## 2021-01-31 DIAGNOSIS — N390 Urinary tract infection, site not specified: Secondary | ICD-10-CM

## 2021-01-31 DIAGNOSIS — R6521 Severe sepsis with septic shock: Secondary | ICD-10-CM

## 2021-01-31 DIAGNOSIS — G9520 Unspecified cord compression: Secondary | ICD-10-CM | POA: Diagnosis not present

## 2021-01-31 DIAGNOSIS — E1159 Type 2 diabetes mellitus with other circulatory complications: Secondary | ICD-10-CM | POA: Diagnosis not present

## 2021-01-31 DIAGNOSIS — R339 Retention of urine, unspecified: Secondary | ICD-10-CM

## 2021-01-31 DIAGNOSIS — A419 Sepsis, unspecified organism: Secondary | ICD-10-CM

## 2021-01-31 DIAGNOSIS — I152 Hypertension secondary to endocrine disorders: Secondary | ICD-10-CM

## 2021-01-31 DIAGNOSIS — B961 Klebsiella pneumoniae [K. pneumoniae] as the cause of diseases classified elsewhere: Secondary | ICD-10-CM

## 2021-01-31 DIAGNOSIS — N179 Acute kidney failure, unspecified: Secondary | ICD-10-CM | POA: Diagnosis not present

## 2021-01-31 LAB — BASIC METABOLIC PANEL
Anion gap: 13 (ref 5–15)
BUN: 69 mg/dL — ABNORMAL HIGH (ref 8–23)
CO2: 13 mmol/L — ABNORMAL LOW (ref 22–32)
Calcium: 7.6 mg/dL — ABNORMAL LOW (ref 8.9–10.3)
Chloride: 102 mmol/L (ref 98–111)
Creatinine, Ser: 2.67 mg/dL — ABNORMAL HIGH (ref 0.44–1.00)
GFR, Estimated: 17 mL/min — ABNORMAL LOW (ref >60)
Glucose, Bld: 155 mg/dL — ABNORMAL HIGH (ref 70–99)
Potassium: 5.1 mmol/L (ref 3.5–5.1)
Sodium: 128 mmol/L — ABNORMAL LOW (ref 135–145)

## 2021-01-31 LAB — GLUCOSE, CAPILLARY
Glucose-Capillary: 161 mg/dL — ABNORMAL HIGH (ref 70–99)
Glucose-Capillary: 170 mg/dL — ABNORMAL HIGH (ref 70–99)
Glucose-Capillary: 172 mg/dL — ABNORMAL HIGH (ref 70–99)
Glucose-Capillary: 195 mg/dL — ABNORMAL HIGH (ref 70–99)
Glucose-Capillary: 244 mg/dL — ABNORMAL HIGH (ref 70–99)
Glucose-Capillary: 303 mg/dL — ABNORMAL HIGH (ref 70–99)

## 2021-01-31 LAB — CBC WITH DIFFERENTIAL/PLATELET
Abs Immature Granulocytes: 0 10*3/uL (ref 0.00–0.07)
Basophils Absolute: 0 10*3/uL (ref 0.0–0.1)
Basophils Relative: 0 %
Eosinophils Absolute: 0 10*3/uL (ref 0.0–0.5)
Eosinophils Relative: 1 %
HCT: 23.6 % — ABNORMAL LOW (ref 36.0–46.0)
Hemoglobin: 8.3 g/dL — ABNORMAL LOW (ref 12.0–15.0)
Lymphocytes Relative: 4 %
Lymphs Abs: 0.7 10*3/uL (ref 0.7–4.0)
MCH: 31 pg (ref 26.0–34.0)
MCHC: 35.2 g/dL (ref 30.0–36.0)
MCV: 88.1 fL (ref 80.0–100.0)
Monocytes Absolute: 0.8 10*3/uL (ref 0.1–1.0)
Monocytes Relative: 6 %
Neutro Abs: 11.3 10*3/uL — ABNORMAL HIGH (ref 1.7–7.7)
Neutrophils Relative %: 86 %
Platelets: 153 10*3/uL (ref 150–400)
RBC: 2.68 MIL/uL — ABNORMAL LOW (ref 3.87–5.11)
RDW: 14.2 % (ref 11.5–15.5)
WBC: 13.1 10*3/uL — ABNORMAL HIGH (ref 4.0–10.5)
nRBC: 0 % (ref 0.0–0.2)

## 2021-01-31 LAB — RENAL FUNCTION PANEL
Albumin: 2.5 g/dL — ABNORMAL LOW (ref 3.5–5.0)
Anion gap: 14 (ref 5–15)
BUN: 67 mg/dL — ABNORMAL HIGH (ref 8–23)
CO2: 13 mmol/L — ABNORMAL LOW (ref 22–32)
Calcium: 7.5 mg/dL — ABNORMAL LOW (ref 8.9–10.3)
Chloride: 98 mmol/L (ref 98–111)
Creatinine, Ser: 2.77 mg/dL — ABNORMAL HIGH (ref 0.44–1.00)
GFR, Estimated: 17 mL/min — ABNORMAL LOW (ref >60)
Glucose, Bld: 162 mg/dL — ABNORMAL HIGH (ref 70–99)
Phosphorus: 4.4 mg/dL (ref 2.5–4.6)
Potassium: 4.8 mmol/L (ref 3.5–5.1)
Sodium: 125 mmol/L — ABNORMAL LOW (ref 135–145)

## 2021-01-31 LAB — PROCALCITONIN: Procalcitonin: 102.2 ng/mL

## 2021-01-31 LAB — PHOSPHORUS: Phosphorus: 4.5 mg/dL (ref 2.5–4.6)

## 2021-01-31 LAB — PROTIME-INR
INR: 1.5 — ABNORMAL HIGH (ref 0.8–1.2)
Prothrombin Time: 18.1 seconds — ABNORMAL HIGH (ref 11.4–15.2)

## 2021-01-31 LAB — MAGNESIUM: Magnesium: 1.5 mg/dL — ABNORMAL LOW (ref 1.7–2.4)

## 2021-01-31 MED ORDER — AMIODARONE HCL IN DEXTROSE 360-4.14 MG/200ML-% IV SOLN
60.0000 mg/h | INTRAVENOUS | Status: DC
  Administered 2021-02-01 (×2): 60 mg/h via INTRAVENOUS
  Filled 2021-01-31: qty 400

## 2021-01-31 MED ORDER — LORAZEPAM 0.5 MG PO TABS
0.2500 mg | ORAL_TABLET | Freq: Four times a day (QID) | ORAL | Status: DC | PRN
  Administered 2021-01-31 – 2021-02-02 (×3): 0.25 mg via ORAL
  Filled 2021-01-31 (×3): qty 1

## 2021-01-31 MED ORDER — AMIODARONE LOAD VIA INFUSION
150.0000 mg | Freq: Once | INTRAVENOUS | Status: AC
  Administered 2021-02-01: 150 mg via INTRAVENOUS
  Filled 2021-01-31: qty 83.34

## 2021-01-31 MED ORDER — SENNA 8.6 MG PO TABS
2.0000 | ORAL_TABLET | Freq: Every day | ORAL | Status: DC
  Administered 2021-01-31 – 2021-02-12 (×10): 17.2 mg via ORAL
  Filled 2021-01-31 (×11): qty 2

## 2021-01-31 MED ORDER — AMIODARONE HCL IN DEXTROSE 360-4.14 MG/200ML-% IV SOLN
INTRAVENOUS | Status: AC
  Filled 2021-01-31: qty 200

## 2021-01-31 MED ORDER — POLYETHYLENE GLYCOL 3350 17 G PO PACK
17.0000 g | PACK | Freq: Every day | ORAL | Status: DC
  Administered 2021-01-31 – 2021-02-12 (×9): 17 g via ORAL
  Filled 2021-01-31 (×10): qty 1

## 2021-01-31 MED ORDER — STERILE WATER FOR INJECTION IV SOLN
INTRAVENOUS | Status: DC
  Filled 2021-01-31 (×8): qty 1000

## 2021-01-31 MED ORDER — AMIODARONE HCL IN DEXTROSE 360-4.14 MG/200ML-% IV SOLN
30.0000 mg/h | INTRAVENOUS | Status: DC
  Administered 2021-02-01 (×2): 30 mg/h via INTRAVENOUS
  Filled 2021-01-31: qty 400

## 2021-01-31 NOTE — Consult Note (Signed)
Date of Admission:  01/26/2021          Reason for Consult: Persistent fevers and patient with Klebsiella pneumonia pneumonia bacteremia seemingly from urinary source    Referring Provider: Dr. Algis Liming   Assessment:  1. Fevers 2. Klebsiella pneumonia bacteremia from presumed urinary source 3. 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 Multifactorial hypotension 5. Acute on renal failure  Plan:  1. Continue ceftriaxone 2. Repeat blood cultures 3. Follow-up susceptibility testing 4. Hopefully she should only need 7 day course since there SHOULD be source control if the source is urine  Dr. Juleen China is available for questions this weekend and will follow up on the culture data, and adjust antibiotics as needed.  Principal Problem:   Cord compression [facility]) Active Problems:   Hypertension associated with diabetes ([place])   Acute kidney injury superimposed on CKD ([place])   Acute lower UTI   Hyponatremia   Scheduled Meds: . atorvastatin  20 mg Oral QPM  . Chlorhexidine Gluconate Cloth  6 each Topical Daily  . insulin aspart  0-15 Units Subcutaneous Q4H  . pantoprazole  40 mg Oral Daily  . polyethylene glycol  17 g Oral Daily  . senna  2 tablet Oral Daily   Continuous Infusions: . sodium chloride 10 mL/hr at 01/31/21 1000  . sodium chloride    . cefTRIAXone (ROCEPHIN)  IV Stopped (01/31/21 0939)  . norepinephrine (LEVOPHED) Adult infusion 3 mcg/min (01/31/21 1000)  .  sodium bicarbonate (isotonic) infusion in sterile water 100 mL/hr at 01/31/21 1242   PRN Meds:.Place/Maintain arterial line **AND** sodium chloride, acetaminophen **OR** acetaminophen, HYDROcodone-acetaminophen, ondansetron **OR** ondansetron (ZOFRAN) IV, polyethylene glycol  HPI: [name] is a 85 y.o. female last medical history significant for cervical spine surgery with ACDF with Dr. Christella Noa, hypertension diabetes mellitus chronic kidney disease and neuropathy who  presented to the ER at [facility] on the 28th with falls down 2 stairs.  Ischial imaging there did not show any acute abnormalities.  Had CT of cervical maxillofacial left ankle tibia-fibula x-rays chest x-ray right and left shoulder x-rays lumbar and pelvic x-rays.  On the 29th she had a CT abdomen and pelvis CT right shoulder lumbar spine which showed distended urinary bladder and mild bilateral hydronephrosis and hydroureter with bilateral ureteral vesicle junction obstructing calculus.  Patient was unable to move her lower extremities but was moving her legs in the bed with normal scans and ultimately was discharged on the 29th of the ER but then presented again the same day to the [facility] emergency department with complaints of pain.  Family states the patient could not walk whatsoever.  She was eval by physical therapy and skilled nursing facility was recommended.  She is then noted to be unable to void and bladder scan showed that she had 999 mL of urine.  Patient then became febrile to 102.8.  Blood cultures taken along with urine cultures.  UA showed pyuria greater than 50 white blood cells.  On questioning today she denied to me that she had dysuria but she did endorse urinary frequency has had trouble with urinary retention.  ER physician examined the patient noted severe weakness in her upper and lower extremities.  MRI cervical thoracic and lumbar spine was ordered.  This showed severe canal stenosis and compression at C3 and C4.  Also postoperative multilevel degenerative changes in the cervical spine.  Patient was transferred to [facility] for evaluation by neurosurgery.  She was started on ceftriaxone 1 g daily for urinary tract infection.  Absolutely her blood cultures and urine cultures have grown Klebsiella pneumonia with sensitivities pending.  Her ceftriaxone has been increased to 2 g daily.  Despite appropriate antibiotics she is continued to have fevers as high as 103.1  yesterday.  She has been seen by neurosurgery and Dr. Christella Noa is planning to on her cervical spine.  This is being deferred however until her clinical status improves.  She has had low blood pressure which is likely multifactorial in the context of bacteremia poor oral intake.  She has been on Levophed currently with normal blood pressures while awake.  I would continue her ceftriaxone 2 g daily and follow-up her sensitivity data I am repeating her blood cultures.  I do suspect that the urine is the source of this bacteremia as there is no other clear source and the same organism is growing in the urine and blood.  While her symptoms are not clear-cut classic urinary tract infection symptoms I do feel this is likely the true source of her bacteremia.  I spent more than 80 minutes with the patient including greater than 50% of time in face to face counseling of the patient personally reviewing radiographs, long with pertinent laboratory microbiological data review of medical records and in coordination of her care.       Review of Systems: Review of Systems  Constitutional: Positive for chills, fever and malaise/fatigue. Negative for diaphoresis and weight loss.  HENT: Negative for congestion, hearing loss, sore throat and tinnitus.   Eyes: Negative for blurred vision and double vision.  Respiratory: Negative for cough, sputum production, shortness of breath and wheezing.   Cardiovascular: Negative for chest pain, palpitations and leg swelling.  Gastrointestinal: Negative for abdominal pain, blood in stool, constipation, diarrhea, heartburn, melena, nausea and vomiting.  Genitourinary: Positive for frequency. Negative for dysuria, flank pain and hematuria.  Musculoskeletal: Positive for falls. Negative for back pain, joint pain and myalgias.  Skin: Negative for itching and rash.  Neurological: Positive for focal weakness and weakness. Negative for dizziness, sensory change, loss of  consciousness and headaches.  Endo/Heme/Allergies: Does not bruise/bleed easily.  Psychiatric/Behavioral: Positive for memory loss. Negative for depression and suicidal ideas. The patient is not nervous/anxious.     Past Medical History:  Diagnosis Date  . Diabetes mellitus without complication ([place])   . High cholesterol   . Neck pain   . Neuropathy     Social History   Tobacco Use  . Smoking status: Never Smoker  . Smokeless tobacco: Never Used  Vaping Use  . Vaping Use: Never used  Substance Use Topics  . Alcohol use: Never  . Drug use: Never    Family History  Problem Relation Age of Onset  . Cardiomyopathy Father   . Cancer Mother    Allergies  Allergen Reactions  . Codeine     OBJECTIVE: Blood pressure (!) 156/116, pulse (!) 104, temperature 98.8 F (37.1 C), temperature source Oral, resp. rate (!) 24, height '5\' 2"'$  (1.575 m), weight 74.8 kg, SpO2 95 %.  Physical Exam Constitutional:      General: She is not in acute distress.    Appearance: She is well-developed. She is not diaphoretic.  HENT:     Head: Normocephalic and atraumatic.     Mouth/Throat:     Pharynx: No oropharyngeal exudate.  Eyes:     General: No scleral icterus.    Conjunctiva/sclera: Conjunctivae normal.  Cardiovascular:  Rate and Rhythm: Normal rate and regular rhythm.  Pulmonary:     Effort: Pulmonary effort is normal. No respiratory distress.     Breath sounds: No wheezing.  Abdominal:     General: There is no distension.  Musculoskeletal:        General: No tenderness.     Cervical back: Normal range of motion and neck supple.  Skin:    General: Skin is warm and dry.     Coloration: Skin is not pale.     Findings: No erythema or rash.  Neurological:     Mental Status: She is alert and oriented to person, place, and time.     Motor: No abnormal muscle tone.    She is profoundly weak  She cannot move lower extremities vs gravity  UE right UE 4//5 stronger than left  which is also 4/5  Lab Results Lab Results  Component Value Date   WBC 13.1 (H) 01/31/2021   HGB 8.3 (L) 01/31/2021   HCT 23.6 (L) 01/31/2021   MCV 88.1 01/31/2021   PLT 153 01/31/2021    Lab Results  Component Value Date   CREATININE 2.77 (H) 01/31/2021   BUN 67 (H) 01/31/2021   NA 125 (L) 01/31/2021   K 4.8 01/31/2021   CL 98 01/31/2021   CO2 13 (L) 01/31/2021    Lab Results  Component Value Date   ALT 16 01/25/2021   AST 22 01/25/2021   ALKPHOS 56 01/25/2021   BILITOT 0.3 01/25/2021     Microbiology: Recent Results (from the past 240 hour(s))  Resp Panel by RT-PCR (Flu A&B, Covid) Nasopharyngeal Swab     Status: None   Collection Time: 01/26/21  3:38 PM   Specimen: Nasopharyngeal Swab; Nasopharyngeal(NP) swabs in vial transport medium  Result Value Ref Range Status   SARS Coronavirus 2 by RT PCR NEGATIVE NEGATIVE Final    Comment: (NOTE) SARS-CoV-2 target nucleic acids are NOT DETECTED.  The SARS-CoV-2 RNA is generally detectable in upper respiratory specimens during the acute phase of infection. The lowest concentration of SARS-CoV-2 viral copies this assay can detect is 138 copies/mL. A negative result does not preclude SARS-Cov-2 infection and should not be used as the sole basis for treatment or other patient management decisions. A negative result may occur with  improper specimen collection/handling, submission of specimen other than nasopharyngeal swab, presence of viral mutation(s) within the areas targeted by this assay, and inadequate number of viral copies(<138 copies/mL). A negative result must be combined with clinical observations, patient history, and epidemiological information. The expected result is Negative.  Fact Sheet for Patients:  EntrepreneurPulse.com.au  Fact Sheet for Healthcare Providers:  IncredibleEmployment.be  This test is no t yet approved or cleared by the [place] FDA and  has been  authorized for detection and/or diagnosis of SARS-CoV-2 by FDA under an Emergency Use Authorization (EUA). This EUA will remain  in effect (meaning this test can be used) for the duration of the COVID-19 declaration under Section 564(b)(1) of the Act, 21 U.S.C.section 360bbb-3(b)(1), unless the authorization is terminated  or revoked sooner.       Influenza A by PCR NEGATIVE NEGATIVE Final   Influenza B by PCR NEGATIVE NEGATIVE Final    Comment: (NOTE) The Xpert Xpress SARS-CoV-2/FLU/RSV plus assay is intended as an aid in the diagnosis of influenza from Nasopharyngeal swab specimens and should not be used as a sole basis for treatment. Nasal washings and aspirates are unacceptable for Xpert Xpress SARS-CoV-2/FLU/RSV  testing.  Fact Sheet for Patients: EntrepreneurPulse.com.au  Fact Sheet for Healthcare Providers: IncredibleEmployment.be  This test is not yet approved or cleared by the [place] FDA and has been authorized for detection and/or diagnosis of SARS-CoV-2 by FDA under an Emergency Use Authorization (EUA). This EUA will remain in effect (meaning this test can be used) for the duration of the COVID-19 declaration under Section 564(b)(1) of the Act, 21 U.S.C. section 360bbb-3(b)(1), unless the authorization is terminated or revoked.  Performed at [facility], [address] [place], [place] [address]   Urine Culture     Status: Abnormal (Preliminary result)   Collection Time: 01/29/21  4:21 PM   Specimen: Urine, Random  Result Value Ref Range Status   Specimen Description   Final    URINE, RANDOM Performed at [facility], [address] [place], [place] [address]    Special Requests   Final    NONE Performed at [facility], [address] [place], [place] [address]    Culture (A)  Final    >=100,000 COLONIES/mL KLEBSIELLA PNEUMONIAE SUSCEPTIBILITIES TO FOLLOW Performed at [facility], [place] [address],  [place], [place] [address]    Report Status PENDING  Incomplete  Culture, blood (Routine X 2) w Reflex to ID Panel     Status: Abnormal (Preliminary result)   Collection Time: 01/29/21  5:30 PM   Specimen: BLOOD RIGHT ARM  Result Value Ref Range Status   Specimen Description   Final    BLOOD RIGHT ARM Performed at [facility], [address], [place], [place] [address]    Special Requests   Final    BOTTLES DRAWN AEROBIC AND ANAEROBIC Blood Culture adequate volume Performed at [facility], [address], [place], [place] [address]    Culture  Setup Time   Final    GRAM NEGATIVE RODS AEROBIC AND ANAEROBIC BOTTLES Gram Stain Report Called to,Read Back By and Verified With: EVernona Rieger (PHARMACY @ [place]) 01/30/21 @ 0819 BY S BEARD Performed at [facility], [address], [place], [place] [address]    Culture KLEBSIELLA PNEUMONIAE (A)  Final   Report Status PENDING  Incomplete  Culture, blood (Routine X 2) w Reflex to ID Panel     Status: Abnormal (Preliminary result)   Collection Time: 01/29/21  5:30 PM   Specimen: BLOOD LEFT ARM  Result Value Ref Range Status   Specimen Description   Final    BLOOD LEFT ARM Performed at [facility], [address] [place], [place] [address]    Special Requests   Final    BOTTLES DRAWN AEROBIC AND ANAEROBIC Blood Culture adequate volume Performed at [facility], [address], [place], [place] [address]    Culture  Setup Time   Final    GRAM NEGATIVE RODS BOTH AEROBIC AND ANAEROBIC BOTTLES Gram Stain Report Called to,Read Back By and Verified With: E. SINCLAIR @ [facility] 01/30/21 @ 0819 BY S BEARD    Culture (A)  Final    KLEBSIELLA PNEUMONIAE SUSCEPTIBILITIES TO FOLLOW Performed at [facility], [place] [address] [place], [place] [address]    Report Status PENDING  Incomplete  Blood Culture ID Panel (Reflexed)     Status: Abnormal   Collection Time: 01/29/21  5:30 PM  Result Value Ref Range Status   Enterococcus faecalis NOT DETECTED NOT  DETECTED Final   Enterococcus Faecium NOT DETECTED NOT DETECTED Final   Listeria monocytogenes NOT DETECTED NOT DETECTED Final  Staphylococcus species NOT DETECTED NOT DETECTED Final   Staphylococcus aureus (BCID) NOT DETECTED NOT DETECTED Final   Staphylococcus epidermidis NOT DETECTED NOT DETECTED Final   Staphylococcus lugdunensis NOT DETECTED NOT DETECTED Final   Streptococcus species NOT DETECTED NOT DETECTED Final   Streptococcus agalactiae NOT DETECTED NOT DETECTED Final   Streptococcus pneumoniae NOT DETECTED NOT DETECTED Final   Streptococcus pyogenes NOT DETECTED NOT DETECTED Final   A.calcoaceticus-baumannii NOT DETECTED NOT DETECTED Final   Bacteroides fragilis NOT DETECTED NOT DETECTED Final   Enterobacterales DETECTED (A) NOT DETECTED Final    Comment: Enterobacterales represent a large order of gram negative bacteria, not a single organism. CRITICAL RESULT CALLED TO, READ BACK BY AND VERIFIED WITH: EMILY SINCLAIR AT 1404 ON 01/30/21 [place] KJ    Enterobacter cloacae complex NOT DETECTED NOT DETECTED Final   Escherichia coli NOT DETECTED NOT DETECTED Final   Klebsiella aerogenes NOT DETECTED NOT DETECTED Final   Klebsiella oxytoca NOT DETECTED NOT DETECTED Final   Klebsiella pneumoniae DETECTED (A) NOT DETECTED Final    Comment: CRITICAL RESULT CALLED TO, READ BACK BY AND VERIFIED WITH: PHARMD EMILY SINCLAIR AT 1404 ON 01/30/21 BY KJ    Proteus species NOT DETECTED NOT DETECTED Final   Salmonella species NOT DETECTED NOT DETECTED Final   Serratia marcescens NOT DETECTED NOT DETECTED Final   Haemophilus influenzae NOT DETECTED NOT DETECTED Final   Neisseria meningitidis NOT DETECTED NOT DETECTED Final   Pseudomonas aeruginosa NOT DETECTED NOT DETECTED Final   Stenotrophomonas maltophilia NOT DETECTED NOT DETECTED Final   Candida albicans NOT DETECTED NOT DETECTED Final   Candida auris NOT DETECTED NOT DETECTED Final   Candida glabrata NOT DETECTED NOT DETECTED Final    Candida krusei NOT DETECTED NOT DETECTED Final   Candida parapsilosis NOT DETECTED NOT DETECTED Final   Candida tropicalis NOT DETECTED NOT DETECTED Final   Cryptococcus neoformans/gattii NOT DETECTED NOT DETECTED Final   CTX-M ESBL NOT DETECTED NOT DETECTED Final   Carbapenem resistance IMP NOT DETECTED NOT DETECTED Final   Carbapenem resistance KPC NOT DETECTED NOT DETECTED Final   Carbapenem resistance NDM NOT DETECTED NOT DETECTED Final   Carbapenem resist OXA 48 LIKE NOT DETECTED NOT DETECTED Final   Carbapenem resistance VIM NOT DETECTED NOT DETECTED Final    Comment: Performed at [facility], [address], [place], [place] [address]  Resp Panel by RT-PCR (Flu A&B, Covid) Nasopharyngeal Swab     Status: None   Collection Time: 01/29/21  7:18 PM   Specimen: Nasopharyngeal Swab; Nasopharyngeal(NP) swabs in vial transport medium  Result Value Ref Range Status   SARS Coronavirus 2 by RT PCR NEGATIVE NEGATIVE Final    Comment: (NOTE) SARS-CoV-2 target nucleic acids are NOT DETECTED.  The SARS-CoV-2 RNA is generally detectable in upper respiratory specimens during the acute phase of infection. The lowest concentration of SARS-CoV-2 viral copies this assay can detect is 138 copies/mL. A negative result does not preclude SARS-Cov-2 infection and should not be used as the sole basis for treatment or other patient management decisions. A negative result may occur with  improper specimen collection/handling, submission of specimen other than nasopharyngeal swab, presence of viral mutation(s) within the areas targeted by this assay, and inadequate number of viral copies(<138 copies/mL). A negative result must be combined with clinical observations, patient history, and epidemiological information. The expected result is Negative.  Fact Sheet for Patients:  EntrepreneurPulse.com.au  Fact Sheet for Healthcare Providers:   IncredibleEmployment.be  This test is  no t yet approved or cleared by the [place] and  has been authorized for detection and/or diagnosis of SARS-CoV-2 by FDA under an Emergency Use Authorization (EUA). This EUA will remain  in effect (meaning this test can be used) for the duration of the COVID-19 declaration under Section 564(b)(1) of the Act, 21 U.S.C.section 360bbb-3(b)(1), unless the authorization is terminated  or revoked sooner.       Influenza A by PCR NEGATIVE NEGATIVE Final   Influenza B by PCR NEGATIVE NEGATIVE Final    Comment: (NOTE) The Xpert Xpress SARS-CoV-2/FLU/RSV plus assay is intended as an aid in the diagnosis of influenza from Nasopharyngeal swab specimens and should not be used as a sole basis for treatment. Nasal washings and aspirates are unacceptable for Xpert Xpress SARS-CoV-2/FLU/RSV testing.  Fact Sheet for Patients: EntrepreneurPulse.com.au  Fact Sheet for Healthcare Providers: IncredibleEmployment.be  This test is not yet approved or cleared by the [place] FDA and has been authorized for detection and/or diagnosis of SARS-CoV-2 by FDA under an Emergency Use Authorization (EUA). This EUA will remain in effect (meaning this test can be used) for the duration of the COVID-19 declaration under Section 564(b)(1) of the Act, 21 U.S.C. section 360bbb-3(b)(1), unless the authorization is terminated or revoked.  Performed at [facility], [address], [place], [place] [address]   MRSA PCR Screening     Status: None   Collection Time: 01/30/21  1:18 PM   Specimen: Nasal Mucosa; Nasopharyngeal  Result Value Ref Range Status   MRSA by PCR NEGATIVE NEGATIVE Final    Comment:        The GeneXpert MRSA Assay (FDA approved for NASAL specimens only), is one component of a comprehensive MRSA colonization surveillance program. It is not intended to diagnose MRSA infection nor to guide  or monitor treatment for MRSA infections. Performed at [facility], [place] [address] [place], [place] [address]     Alcide Evener, [facility] [phone] pager  01/31/2021, 1:27 PM

## 2021-01-31 NOTE — Progress Notes (Signed)
eLink Physician-Brief Progress Note Patient Name: [name] DOB: [birth year] MRN: [id number]   Date of Service  01/31/2021  HPI/Events of Note  AFIB with RVR - Ventricular rate up to 160. Patient has just been weaned off a Norepinephrine IV infusion. EKG reveals atrial flutter (Ventricular rate = 138) with variable A-V block. Possible Anterior infarct , age undetermined.  eICU Interventions  Plan: 1. Amiodarone IV bolus and infusion. 2. BMP and Mg++ STAT.      Intervention Category Major Interventions: Arrhythmia - evaluation and management  Avelino Herren [place] 01/31/2021, 11:44 PM

## 2021-01-31 NOTE — Progress Notes (Signed)
Neurosurgery  Pt seen and examined in morning of 6/2.  Admitted with UTI, sepsis, increasing falls and weakness, and found to have significant myelopathy on exam.  Found to have severe C3-4 stenosis with cord impingement adjacent to previous ACDFs performed by Dr. Christella Noa years ago.  Despite her age, she likely would benefit from surgical treatment of this.  I discussed with Dr. Christella Noa who will be taking over the neurosurgical care for this patient.

## 2021-01-31 NOTE — Progress Notes (Signed)
Patient ID: [name], female   DOB: 09/24/1935, 85 y.o.   MRN: [id number] BP (!) 156/116   Pulse (!) 104   Temp (!) 101.7 F (38.7 C) (Axillary)   Resp (!) 24   Ht '5\' 2"'$  (1.575 m)   Wt 74.8 kg   SpO2 95%   BMI 30.18 kg/m  Currently lethargic, will follow commands. Arouses to voice Not active enough for an exam of strength Will need cervical procedure, but with sepsis, and kidney injury she will not be headed to the OR until some systemic improvement.  Will monitor.

## 2021-01-31 NOTE — Progress Notes (Signed)
Patient ID: [name], female   DOB: 29-Oct-1935, 85 y.o.   MRN: [id number] [facility] Progress Note   Assessment/ Plan:   1.  Acute kidney injury on chronic kidney disease stage III: This appears to be possibly from 2 insults; likely initiated by obstruction and now exacerbated by hypotension with likely ischemic ATN.    She has been oliguric overnight and with stabilization of her metabolic acidosis on LR.  She does not have any acute dialysis needs at this time and anticipate that management of the underlying septic process will translate to improving renal function.  2.  Hyponatremia: Likely secondary to impaired free water handling in the setting of acute kidney injury.  Monitor with isotonic fluids and limit oral intake to <1.2 L a day. 3.  Anion gap metabolic acidosis: Secondary to acute kidney injury, continue LR at this time anticipating improvement of bicarbonate levels as renal function recovers from acute insults. 4.  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 5.  Urinary tract infection: She is now on Levophed drip for management of hypotension from distributive shock; unfortunately remains febrile on intravenous ceftriaxone.  Subjective:   Transferred to ICU yesterday for continued hypotension/sepsis-started on Levophed.  She remained febrile overnight.   Objective:   BP (!) 121/57   Pulse (!) 102   Temp (!) 101.7 F (38.7 C) (Axillary)   Resp (!) 28   Ht '5\' 2"'$  (1.575 m)   Wt 74.8 kg   SpO2 95%   BMI 30.18 kg/m   Intake/Output Summary (Last 24 hours) at 01/31/2021 1038 Last data filed at 01/31/2021 1000 Gross per 24 hour  Intake 5820.63 ml  Output 1100 ml  Net 4720.63 ml   Weight change:   Physical Exam: Gen: Comfortably resting in bed, interactive with nurses CVS: Pulse regular tachycardia, S1 and S2 with ejection systolic murmur Resp: Clear to auscultation  bilaterally without rales/rhonchi Abd: Soft, flat, nontender Ext: Trace lower extremity edema  Imaging: MR Cervical Spine Wo Contrast  Result Date: 01/29/2021 CLINICAL DATA:  Bilateral arm weakness, recent fall EXAM: MRI CERVICAL, THORACIC AND LUMBAR SPINE WITHOUT CONTRAST TECHNIQUE: Multiplanar and multiecho pulse sequences of the cervical spine, to include the craniocervical junction and cervicothoracic junction, and thoracic and lumbar spine, were obtained without intravenous contrast. COMPARISON:  MRI lumbar spine 12/02/2020 FINDINGS: MRI CERVICAL SPINE Alignment: Approximately 4 mm retrolisthesis at C3-C4. Vertebrae: Prior anterior fusion at C4-C6. Appears to be solid bridging bone. Hardware is not well evaluated and there is associated susceptibility artifact. Vertebral body heights are maintained apart from degenerative endplate irregularity at C3-C4 and C6-C7. No substantial marrow edema. No suspicious osseous lesion. Cord: Cord is compressed at C3-C4. There is abnormal cord signal at approximately C4 and C5 levels. Posterior Fossa, vertebral arteries, paraspinal tissues: Unremarkable. Disc levels: C2-C3: Right greater than left uncovertebral and facet hypertrophy. No canal stenosis. Mild to moderate foraminal stenosis, greater on the right. C3-C4: Disc bulge with endplate osteophytes. Uncovertebral hypertrophy. Severe canal stenosis with cord compression. Moderate to marked foraminal stenosis. C4-C5:  Fusion level.  No canal or foraminal stenosis. C5-C6: Fusion level. No canal stenosis. Spurring may mildly narrow the neural foramina. C6-C7: Disc bulge with endplate osteophytes. Uncovertebral and facet hypertrophy. Mild canal stenosis. Mild to moderate foraminal stenosis. C7-T1: Endplate osteophytes and facet hypertrophy. No canal or foraminal stenosis. MRI THORACIC SPINE Alignment:  Preserved. Vertebrae: Vertebral body heights are maintained. There is  no substantial marrow edema. No suspicious osseous  lesion. Cord:  No abnormal signal. Paraspinal and other soft tissues: Unremarkable. Disc levels: Intervertebral disc heights and signal are maintained. Minor degenerative changes are present without stenosis. MRI LUMBAR SPINE Segmentation:  Standard. Alignment:  Stable with trace anterolisthesis at L4-L5. Vertebrae: Stable vertebral body heights. No substantial marrow edema. No suspicious osseous lesion. Conus medullaris and cauda equina: Conus extends to the L1-L2 level. Conus and cauda equina appear normal. Paraspinal and other soft tissues: Unremarkable. Disc levels: L1-L2:  No stenosis. L2-L3:  Facet arthropathy.  No stenosis. L3-L4: Disc bulge. Facet arthropathy. No canal or right foraminal stenosis. Mild left foraminal stenosis. L4-L5: Disc bulge slightly eccentric to the right. Facet arthropathy. Minor canal stenosis with slight effacement of the subarticular recesses. Mild right foraminal stenosis. No left foraminal stenosis. L5-S1: Disc bulge with endplate osteophytic ridging eccentric to the left. Facet arthropathy. No canal stenosis. Partial effacement of the left subarticular recess. No right foraminal stenosis. Moderate to marked left foraminal stenosis. IMPRESSION: Postoperative and multilevel degenerative changes of the cervical spine. There is severe canal stenosis with cord compression at C3-C4. Abnormal cord signal is present just below this level (at operative levels) and may reflect edema or myelomalacia No significant degenerative changes of the thoracic spine. Multilevel degenerative changes of lumbar spine similar to recent prior study. These results were called by telephone at the time of interpretation on 01/29/2021 at 5:20 pm to provider Dr. Rogene Houston, who verbally acknowledged these results. Electronically Signed   By: Macy Mis M.D.   On: 01/29/2021 17:38   MR THORACIC SPINE WO CONTRAST  Result Date: 01/29/2021 CLINICAL DATA:  Bilateral arm weakness, recent fall EXAM: MRI CERVICAL,  THORACIC AND LUMBAR SPINE WITHOUT CONTRAST TECHNIQUE: Multiplanar and multiecho pulse sequences of the cervical spine, to include the craniocervical junction and cervicothoracic junction, and thoracic and lumbar spine, were obtained without intravenous contrast. COMPARISON:  MRI lumbar spine 12/02/2020 FINDINGS: MRI CERVICAL SPINE Alignment: Approximately 4 mm retrolisthesis at C3-C4. Vertebrae: Prior anterior fusion at C4-C6. Appears to be solid bridging bone. Hardware is not well evaluated and there is associated susceptibility artifact. Vertebral body heights are maintained apart from degenerative endplate irregularity at C3-C4 and C6-C7. No substantial marrow edema. No suspicious osseous lesion. Cord: Cord is compressed at C3-C4. There is abnormal cord signal at approximately C4 and C5 levels. Posterior Fossa, vertebral arteries, paraspinal tissues: Unremarkable. Disc levels: C2-C3: Right greater than left uncovertebral and facet hypertrophy. No canal stenosis. Mild to moderate foraminal stenosis, greater on the right. C3-C4: Disc bulge with endplate osteophytes. Uncovertebral hypertrophy. Severe canal stenosis with cord compression. Moderate to marked foraminal stenosis. C4-C5:  Fusion level.  No canal or foraminal stenosis. C5-C6: Fusion level. No canal stenosis. Spurring may mildly narrow the neural foramina. C6-C7: Disc bulge with endplate osteophytes. Uncovertebral and facet hypertrophy. Mild canal stenosis. Mild to moderate foraminal stenosis. C7-T1: Endplate osteophytes and facet hypertrophy. No canal or foraminal stenosis. MRI THORACIC SPINE Alignment:  Preserved. Vertebrae: Vertebral body heights are maintained. There is no substantial marrow edema. No suspicious osseous lesion. Cord:  No abnormal signal. Paraspinal and other soft tissues: Unremarkable. Disc levels: Intervertebral disc heights and signal are maintained. Minor degenerative changes are present without stenosis. MRI LUMBAR SPINE  Segmentation:  Standard. Alignment:  Stable with trace anterolisthesis at L4-L5. Vertebrae: Stable vertebral body heights. No substantial marrow edema. No suspicious osseous lesion. Conus medullaris and cauda equina: Conus extends to the L1-L2 level. Conus and cauda equina  appear normal. Paraspinal and other soft tissues: Unremarkable. Disc levels: L1-L2:  No stenosis. L2-L3:  Facet arthropathy.  No stenosis. L3-L4: Disc bulge. Facet arthropathy. No canal or right foraminal stenosis. Mild left foraminal stenosis. L4-L5: Disc bulge slightly eccentric to the right. Facet arthropathy. Minor canal stenosis with slight effacement of the subarticular recesses. Mild right foraminal stenosis. No left foraminal stenosis. L5-S1: Disc bulge with endplate osteophytic ridging eccentric to the left. Facet arthropathy. No canal stenosis. Partial effacement of the left subarticular recess. No right foraminal stenosis. Moderate to marked left foraminal stenosis. IMPRESSION: Postoperative and multilevel degenerative changes of the cervical spine. There is severe canal stenosis with cord compression at C3-C4. Abnormal cord signal is present just below this level (at operative levels) and may reflect edema or myelomalacia No significant degenerative changes of the thoracic spine. Multilevel degenerative changes of lumbar spine similar to recent prior study. These results were called by telephone at the time of interpretation on 01/29/2021 at 5:20 pm to provider Dr. Rogene Houston, who verbally acknowledged these results. Electronically Signed   By: Macy Mis M.D.   On: 01/29/2021 17:38   MR LUMBAR SPINE WO CONTRAST  Result Date: 01/29/2021 CLINICAL DATA:  Bilateral arm weakness, recent fall EXAM: MRI CERVICAL, THORACIC AND LUMBAR SPINE WITHOUT CONTRAST TECHNIQUE: Multiplanar and multiecho pulse sequences of the cervical spine, to include the craniocervical junction and cervicothoracic junction, and thoracic and lumbar spine, were  obtained without intravenous contrast. COMPARISON:  MRI lumbar spine 12/02/2020 FINDINGS: MRI CERVICAL SPINE Alignment: Approximately 4 mm retrolisthesis at C3-C4. Vertebrae: Prior anterior fusion at C4-C6. Appears to be solid bridging bone. Hardware is not well evaluated and there is associated susceptibility artifact. Vertebral body heights are maintained apart from degenerative endplate irregularity at C3-C4 and C6-C7. No substantial marrow edema. No suspicious osseous lesion. Cord: Cord is compressed at C3-C4. There is abnormal cord signal at approximately C4 and C5 levels. Posterior Fossa, vertebral arteries, paraspinal tissues: Unremarkable. Disc levels: C2-C3: Right greater than left uncovertebral and facet hypertrophy. No canal stenosis. Mild to moderate foraminal stenosis, greater on the right. C3-C4: Disc bulge with endplate osteophytes. Uncovertebral hypertrophy. Severe canal stenosis with cord compression. Moderate to marked foraminal stenosis. C4-C5:  Fusion level.  No canal or foraminal stenosis. C5-C6: Fusion level. No canal stenosis. Spurring may mildly narrow the neural foramina. C6-C7: Disc bulge with endplate osteophytes. Uncovertebral and facet hypertrophy. Mild canal stenosis. Mild to moderate foraminal stenosis. C7-T1: Endplate osteophytes and facet hypertrophy. No canal or foraminal stenosis. MRI THORACIC SPINE Alignment:  Preserved. Vertebrae: Vertebral body heights are maintained. There is no substantial marrow edema. No suspicious osseous lesion. Cord:  No abnormal signal. Paraspinal and other soft tissues: Unremarkable. Disc levels: Intervertebral disc heights and signal are maintained. Minor degenerative changes are present without stenosis. MRI LUMBAR SPINE Segmentation:  Standard. Alignment:  Stable with trace anterolisthesis at L4-L5. Vertebrae: Stable vertebral body heights. No substantial marrow edema. No suspicious osseous lesion. Conus medullaris and cauda equina: Conus extends to  the L1-L2 level. Conus and cauda equina appear normal. Paraspinal and other soft tissues: Unremarkable. Disc levels: L1-L2:  No stenosis. L2-L3:  Facet arthropathy.  No stenosis. L3-L4: Disc bulge. Facet arthropathy. No canal or right foraminal stenosis. Mild left foraminal stenosis. L4-L5: Disc bulge slightly eccentric to the right. Facet arthropathy. Minor canal stenosis with slight effacement of the subarticular recesses. Mild right foraminal stenosis. No left foraminal stenosis. L5-S1: Disc bulge with endplate osteophytic ridging eccentric to the left. Facet arthropathy. No  canal stenosis. Partial effacement of the left subarticular recess. No right foraminal stenosis. Moderate to marked left foraminal stenosis. IMPRESSION: Postoperative and multilevel degenerative changes of the cervical spine. There is severe canal stenosis with cord compression at C3-C4. Abnormal cord signal is present just below this level (at operative levels) and may reflect edema or myelomalacia No significant degenerative changes of the thoracic spine. Multilevel degenerative changes of lumbar spine similar to recent prior study. These results were called by telephone at the time of interpretation on 01/29/2021 at 5:20 pm to provider Dr. Rogene Houston, who verbally acknowledged these results. Electronically Signed   By: Macy Mis M.D.   On: 01/29/2021 17:38   US RENAL  Result Date: 01/30/2021 CLINICAL DATA:  Acute kidney injury. EXAM: RENAL / URINARY TRACT ULTRASOUND COMPLETE COMPARISON:  CT abdomen pelvis dated Jan 26, 2021. FINDINGS: Right Kidney: Renal measurements: 10.6 x 5.1 x 5.6 cm = volume: 157 mL. Echogenicity within normal limits. No mass or hydronephrosis visualized. Small amount of perinephric fluid. Left Kidney: Renal measurements: 10.4 x 6.2 x 7.0 cm = volume: 232 mL. Echogenicity within normal limits. No mass or hydronephrosis visualized. Bladder: Decompressed by Foley catheter. Other: None. IMPRESSION: 1. No acute  abnormality. Small amount of right perinephric fluid, nonspecific. Electronically Signed   By: Titus Dubin M.D.   On: 01/30/2021 11:15   DG Chest Port 1 View  Result Date: 01/31/2021 CLINICAL DATA:  85 year old female with abnormal respiration. EXAM: PORTABLE CHEST 1 VIEW COMPARISON:  Portable chest 01/25/2021. FINDINGS: Portable AP view at 0337 hours. Rotated to the right now. Lung volumes and mediastinal contours are stable. Visualized tracheal air column is within normal limits. Allowing for portable technique the lungs are clear. No pneumothorax or pleural effusion. ACDF hardware visible. IMPRESSION: No acute cardiopulmonary abnormality when allowing for rotated patient positioning. Electronically Signed   By: Genevie Ann M.D.   On: 01/31/2021 07:22    Labs: BMET Recent Labs  Lab 01/25/21 2050 01/29/21 1505 01/30/21 0410 01/30/21 1302 01/31/21 0905 01/31/21 0906  NA 135 124* 124* 128* 128* 125*  K 4.2 5.0 4.7 4.7 5.1 4.8  CL 103 94* 93* 101 102 98  CO2 22 19* 16* 15* 13* 13*  GLUCOSE 152* 208* 142* 110* 155* 162*  BUN 40* 67* 72* 72* 69* 67*  CREATININE 1.27* 2.47* 2.75* 2.85* 2.67* 2.77*  CALCIUM 8.7* 8.1* 7.7* 7.4* 7.6* 7.5*  PHOS  --   --   --   --  4.5 4.4   CBC Recent Labs  Lab 01/25/21 2050 01/29/21 1505 01/30/21 0410 01/31/21 0905  WBC 6.4 10.9* 11.6* 13.1*  NEUTROABS 3.0 9.4*  --  PENDING  HGB 10.6* 9.8* 8.8* 8.3*  HCT 31.7* 29.0* 25.3* 23.6*  MCV 91.1 91.2 87.5 88.1  PLT 227 175 138* 153   Medications:    . atorvastatin  20 mg Oral QPM  . Chlorhexidine Gluconate Cloth  6 each Topical Daily  . insulin aspart  0-15 Units Subcutaneous Q4H  . pantoprazole  40 mg Oral Daily  . polyethylene glycol  17 g Oral Daily  . senna  2 tablet Oral Daily   Elmarie Shiley, MD 01/31/2021, 10:38 AM

## 2021-01-31 NOTE — Progress Notes (Signed)
PROGRESS NOTE   [name]  [id number]    DOB: [birth year]    DOA: 01/26/2021  PCP: Curlene Labrum, MD   I have briefly reviewed patients previous medical records in [facility].  Chief Complaint  Patient presents with  . Fall    Brief Narrative:  85 year old female, recently discharged from SNF rehab mid April, lives alone, ambulates with the help of a walker, medical history significant for but not limited to HTN, DM2, CKD stage IIIb, neuropathy, seen initially at [facility] ED on 5/28 after fall down 4-5 stairs at her daughter's house whom she was visiting, extensive imaging without acute abnormalities and patient apparently left AMA.  However on reaching home, patient profoundly weak, unable to stand up even with multiple assistance, pain, daughter had EMT take her to [facility] ED on 5/29.  Further imaging including CT chest abdomen and pelvis without acute findings.  PT evaluated, SNF recommended and TOC were exploring options.  Patient remained at [facility] ED.  Per TOC, patient was about to exhaust her SNF days, unable to pay co-pay out-of-pocket and eventual plan was to take patient to daughter's home.  Ongoing pain issues, all extremity weakness.  On 6/1, noted to have acute urinary retention with fever.  MRI spine showed C3-4 cord compression, EDP discussed with neurosurgery, patient transferred to [facility] progressive care unit.  Neurosurgery evaluated and plan surgery early next week.  Morning of 6/2, patient with multifactorial shock, PCCM consulted, transferred to ICU, briefly on vasopressors overnight.  Febrile despite IV antibiotics, ID consulted.  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  Abnormal cord signal is present just below this level (at operative levels) and may reflect edema or myelomalacia.  No acute abnormalities on MRI of T and L-spine.  Unsure how her fall made this worse.  No steroids or cervical collar.  Neurosurgery plans surgical fixation pending medical stabilization (sepsis, AKI etc.).  Septic shock, not POA, secondary to Klebsiella pneumonia bacteremia from UTI: Developed shock on 6/2 despite IVF resuscitation, transferred to ICU, briefly on vasopressors, turned off 6/3.  Despite 48 hours of appropriate IV antibiotics, continues to spike high fevers up to 103.1 F last night and 101.7 F this morning.  Both blood and urine cultures show Klebsiella pneumonia, susceptibilities pending.  Shock seems to have resolved.  Requested ID consultation.  Multifactorial shock (hypovolemic, sepsis and neurogenic from cord compression): Management as noted above.  Came off pressors this morning.  Shock resolved.  Acute urinary retention: Likely due to cervical cord compression.  Continue Foley catheter.  Acute kidney injury complicating stage IIIb chronic kidney disease/anion gap metabolic acidosis: Likely multifactorial due to hypovolemia/prerenal, ATN from hypotension and sepsis and acute urinary retention.  Renal ultrasound 6/2 without acute abnormality.  Foley to remain.  Strict intake output.  Avoid nephrotoxins.  Baseline creatinine around 1.2.  1100 mL urine output in the last 24 hours, 750 overnight.  Urine output seems to have picked up.  Creatinine has plateaued.  Metabolic acidosis worse (bicarbonate 13) but normal anion gap.  Now on bicarbonate drip/isotonic saline.  Nephrology following.  Trend daily BMP.  Acute metabolic  encephalopathy: Multifactorial due to sepsis, AKI, opioid pain meds.  Minimize opioids and sedatives.  Delirium precautions.  Treat the treatable and monitor closely.  Gabapentin  has been held.  Mental status significantly improved and reportedly close to baseline as per discussion with daughter at bedside this morning.  Dehydration with hyponatremia: Dehydration improved.  Serum sodium still at 128-125 likely secondary to impaired free water handling in the setting of AKI.  Continue bicarbonate drip.  Follow BMP in a.m.  Acute anemia: Probably multifactorial due to acute illness, hemodilution.  No bleeding reported.  Relatively stable.  Trend daily CBC.  Acute thrombocytopenia: Likely related to sepsis.  Transient and seems to have resolved.  Essential hypertension: S/p recent shock requiring vasopressors.  Obviously holding antihypertensives.  Type II DM with peripheral neuropathy: Reasonable inpatient control.  SSI.  Hyperlipidemia: Statins.  Body mass index is 30.18 kg/m.   DVT prophylaxis: SCDs Start: 01/29/21 2349 Place and maintain sequential compression device Start: 01/29/21 2346     Code Status: Full Code Family Communication: I discussed in detail with patient's daughter at bedside 6/3 Disposition:  Status is: Inpatient  Remains inpatient appropriate because:Inpatient level of care appropriate due to severity of illness   Dispo: The patient is from: Home              Anticipated d/c is to: TBD              Patient currently is not medically stable to d/c.   Difficult to place patient No        Consultants:   Nephrology Neurosurgery PCCM Infectious disease  Procedures:   Foley catheter  Antimicrobials:    Anti-infectives (From admission, onward)   Start     Dose/Rate Route Frequency Ordered Stop   01/30/21 1800  cefTRIAXone (ROCEPHIN) 1 g in sodium chloride 0.9 % 100 mL IVPB  Status:  Discontinued        1 g 200 mL/hr over 30 Minutes Intravenous Every 24 hours 01/29/21 2348 01/30/21 0826   01/30/21 1000  cefTRIAXone (ROCEPHIN) 2 g in sodium chloride 0.9 % 100 mL IVPB        2 g 200 mL/hr over 30 Minutes Intravenous Every 24  hours 01/30/21 0826     01/29/21 1630  cefTRIAXone (ROCEPHIN) 1 g in sodium chloride 0.9 % 100 mL IVPB        1 g 200 mL/hr over 30 Minutes Intravenous  Once 01/29/21 1621 01/29/21 1855        Subjective:  Patient seen this morning in ICU.  Daughter at bedside.  Discussed with patient's RN.  Per daughter, mental status significantly better than yesterday and approaching baseline.  Patient reports ongoing weakness in all limbs, lower extremities weaker than upper extremities.  Pain at multiple sites.  Still slightly confused at times.  Tolerated some breakfast.  No dyspnea reported  Objective:   Vitals:   01/31/21 1015 01/31/21 1030 01/31/21 1100 01/31/21 1200  BP:  (!) 130/55 (!) 156/116   Pulse: (!) 102 97 (!) 104   Resp: (!) 28 20 (!) 24   Temp:    98.8 F (37.1 C)  TempSrc:    Oral  SpO2: 95% 95% 95%   Weight:      Height:        General exam: Elderly female, moderately built and nourished lying lying propped up in bed without distress.  Looks much improved compared to yesterday. Respiratory system: Clear to auscultation.  No increased work  of breathing. Cardiovascular system: S1 and S2 heard, RRR.  No JVD, murmurs or pedal edema.  Telemetry personally reviewed: Sinus rhythm in the 90s to mild sinus tachycardia up to 120s. Gastrointestinal system: Abdomen is nondistended, soft and nontender. No organomegaly or masses felt. Normal bowel sounds heard. Central nervous system: Alert and oriented x3 with no focal neurological deficits. Extremities: Bilateral lower extremities grade 0-1/5 power, right upper extremity proximally grade 4 x 5 and able to lift up against gravity, distally 3 x 5.  Left upper extremity 2 x 5 proximally and 3 x 5 distally. Skin: No rashes, lesions or ulcers Psychiatry: Judgement and insight impaired but improved compared to yesterday. Mood & affect appropriate.     Data Reviewed:   I have personally reviewed following labs and imaging  studies   CBC: Recent Labs  Lab 01/25/21 2050 01/29/21 1505 01/30/21 0410 01/31/21 0905  WBC 6.4 10.9* 11.6* 13.1*  NEUTROABS 3.0 9.4*  --  PENDING  HGB 10.6* 9.8* 8.8* 8.3*  HCT 31.7* 29.0* 25.3* 23.6*  MCV 91.1 91.2 87.5 88.1  PLT 227 175 138* 0000000    Basic Metabolic Panel: Recent Labs  Lab 01/30/21 1302 01/31/21 0905 01/31/21 0906  NA 128* 128* 125*  K 4.7 5.1 4.8  CL 101 102 98  CO2 15* 13* 13*  GLUCOSE 110* 155* 162*  BUN 72* 69* 67*  CREATININE 2.85* 2.67* 2.77*  CALCIUM 7.4* 7.6* 7.5*  MG  --  1.5*  --   PHOS  --  4.5 4.4    Liver Function Tests: Recent Labs  Lab 01/25/21 2050 01/31/21 0906  AST 22  --   ALT 16  --   ALKPHOS 56  --   BILITOT 0.3  --   PROT 6.4*  --   ALBUMIN 3.5 2.5*    CBG: Recent Labs  Lab 01/31/21 0316 01/31/21 0739 01/31/21 1122  GLUCAP 195* 170* 172*    Microbiology Studies:   Recent Results (from the past 240 hour(s))  Resp Panel by RT-PCR (Flu A&B, Covid) Nasopharyngeal Swab     Status: None   Collection Time: 01/26/21  3:38 PM   Specimen: Nasopharyngeal Swab; Nasopharyngeal(NP) swabs in vial transport medium  Result Value Ref Range Status   SARS Coronavirus 2 by RT PCR NEGATIVE NEGATIVE Final    Comment: (NOTE) SARS-CoV-2 target nucleic acids are NOT DETECTED.  The SARS-CoV-2 RNA is generally detectable in upper respiratory specimens during the acute phase of infection. The lowest concentration of SARS-CoV-2 viral copies this assay can detect is 138 copies/mL. A negative result does not preclude SARS-Cov-2 infection and should not be used as the sole basis for treatment or other patient management decisions. A negative result may occur with  improper specimen collection/handling, submission of specimen other than nasopharyngeal swab, presence of viral mutation(s) within the areas targeted by this assay, and inadequate number of viral copies(<138 copies/mL). A negative result must be combined with clinical  observations, patient history, and epidemiological information. The expected result is Negative.  Fact Sheet for Patients:  EntrepreneurPulse.com.au  Fact Sheet for Healthcare Providers:  IncredibleEmployment.be  This test is no t yet approved or cleared by the [place] FDA and  has been authorized for detection and/or diagnosis of SARS-CoV-2 by FDA under an Emergency Use Authorization (EUA). This EUA will remain  in effect (meaning this test can be used) for the duration of the COVID-19 declaration under Section 564(b)(1) of the Act, 21 U.S.C.section 360bbb-3(b)(1), unless the authorization is  terminated  or revoked sooner.       Influenza A by PCR NEGATIVE NEGATIVE Final   Influenza B by PCR NEGATIVE NEGATIVE Final    Comment: (NOTE) The Xpert Xpress SARS-CoV-2/FLU/RSV plus assay is intended as an aid in the diagnosis of influenza from Nasopharyngeal swab specimens and should not be used as a sole basis for treatment. Nasal washings and aspirates are unacceptable for Xpert Xpress SARS-CoV-2/FLU/RSV testing.  Fact Sheet for Patients: EntrepreneurPulse.com.au  Fact Sheet for Healthcare Providers: IncredibleEmployment.be  This test is not yet approved or cleared by the [place] FDA and has been authorized for detection and/or diagnosis of SARS-CoV-2 by FDA under an Emergency Use Authorization (EUA). This EUA will remain in effect (meaning this test can be used) for the duration of the COVID-19 declaration under Section 564(b)(1) of the Act, 21 U.S.C. section 360bbb-3(b)(1), unless the authorization is terminated or revoked.  Performed at [facility], [address] [place],  [place] [address]   Urine Culture     Status: Abnormal (Preliminary result)   Collection Time: 01/29/21  4:21 PM   Specimen: Urine, Random  Result Value Ref Range Status   Specimen Description   Final    URINE,  RANDOM Performed at [facility], [address] [place], [place] [address]    Special Requests   Final    NONE Performed at [facility], [address], [place], [place] [address]    Culture (A)  Final    >=100,000 COLONIES/mL KLEBSIELLA PNEUMONIAE SUSCEPTIBILITIES TO FOLLOW Performed at [facility], [place] [address] [place], [place] [address]    Report Status PENDING  Incomplete  Culture, blood (Routine X 2) w Reflex to ID Panel     Status: Abnormal (Preliminary result)   Collection Time: 01/29/21  5:30 PM   Specimen: BLOOD RIGHT ARM  Result Value Ref Range Status   Specimen Description   Final    BLOOD RIGHT ARM Performed at [facility], [address], [place], [place] [address]    Special Requests   Final    BOTTLES DRAWN AEROBIC AND ANAEROBIC Blood Culture adequate volume Performed at [facility], [address] [place], [place] [address]    Culture  Setup Time   Final    GRAM NEGATIVE RODS AEROBIC AND ANAEROBIC BOTTLES Gram Stain Report Called to,Read Back By and Verified With: EVernona Rieger (PHARMACY @ [place]) 01/30/21 @ 0819 BY S BEARD Performed at [facility], [address] [place], [place] [address]    Culture KLEBSIELLA PNEUMONIAE (A)  Final   Report Status PENDING  Incomplete  Culture, blood (Routine X 2) w Reflex to ID Panel     Status: Abnormal (Preliminary result)   Collection Time: 01/29/21  5:30 PM   Specimen: BLOOD LEFT ARM  Result Value Ref Range Status   Specimen Description   Final    BLOOD LEFT ARM Performed at [facility], [address], [place], [place] [address]    Special Requests   Final    BOTTLES DRAWN AEROBIC AND ANAEROBIC Blood Culture adequate volume Performed at [facility], [address], [place], [place] [address]    Culture  Setup Time   Final    GRAM NEGATIVE RODS BOTH AEROBIC AND ANAEROBIC BOTTLES Gram Stain Report Called to,Read Back By and Verified With: E. SINCLAIR @ [place] 01/30/21 @ 0819 BY S BEARD    Culture (A)  Final     KLEBSIELLA PNEUMONIAE SUSCEPTIBILITIES TO FOLLOW Performed at [facility], [place] [address] [place], [place] [address]    Report Status PENDING  Incomplete  Blood Culture ID Panel (Reflexed)     Status: Abnormal   Collection Time: 01/29/21  5:30 PM  Result Value Ref Range Status   Enterococcus faecalis NOT DETECTED NOT DETECTED Final   Enterococcus Faecium NOT DETECTED NOT DETECTED Final   Listeria monocytogenes NOT DETECTED NOT DETECTED Final   Staphylococcus species NOT DETECTED NOT DETECTED Final   Staphylococcus aureus (BCID) NOT DETECTED NOT DETECTED Final   Staphylococcus epidermidis NOT DETECTED NOT DETECTED Final   Staphylococcus lugdunensis NOT DETECTED NOT DETECTED Final   Streptococcus species NOT DETECTED NOT DETECTED Final   Streptococcus agalactiae NOT DETECTED NOT DETECTED Final   Streptococcus pneumoniae NOT DETECTED NOT DETECTED Final   Streptococcus pyogenes NOT DETECTED NOT DETECTED Final   A.calcoaceticus-baumannii NOT DETECTED NOT DETECTED Final   Bacteroides fragilis NOT DETECTED NOT DETECTED Final   Enterobacterales DETECTED (A) NOT DETECTED Final    Comment: Enterobacterales represent a large order of gram negative bacteria, not a single organism. CRITICAL RESULT CALLED TO, READ BACK BY AND VERIFIED WITH: EMILY SINCLAIR AT 1404 ON 01/30/21 [place] KJ    Enterobacter cloacae complex NOT DETECTED NOT DETECTED Final   Escherichia coli NOT DETECTED NOT DETECTED Final   Klebsiella aerogenes NOT DETECTED NOT DETECTED Final   Klebsiella oxytoca NOT DETECTED NOT DETECTED Final   Klebsiella pneumoniae DETECTED (A) NOT DETECTED Final    Comment: CRITICAL RESULT CALLED TO, READ BACK BY AND VERIFIED WITH: PHARMD EMILY SINCLAIR AT 1404 ON 01/30/21 BY KJ    Proteus species NOT DETECTED NOT DETECTED Final   Salmonella species NOT DETECTED NOT DETECTED Final   Serratia marcescens NOT DETECTED NOT DETECTED Final   Haemophilus influenzae NOT DETECTED NOT DETECTED Final    Neisseria meningitidis NOT DETECTED NOT DETECTED Final   Pseudomonas aeruginosa NOT DETECTED NOT DETECTED Final   Stenotrophomonas maltophilia NOT DETECTED NOT DETECTED Final   Candida albicans NOT DETECTED NOT DETECTED Final   Candida auris NOT DETECTED NOT DETECTED Final   Candida glabrata NOT DETECTED NOT DETECTED Final   Candida krusei NOT DETECTED NOT DETECTED Final   Candida parapsilosis NOT DETECTED NOT DETECTED Final   Candida tropicalis NOT DETECTED NOT DETECTED Final   Cryptococcus neoformans/gattii NOT DETECTED NOT DETECTED Final   CTX-M ESBL NOT DETECTED NOT DETECTED Final   Carbapenem resistance IMP NOT DETECTED NOT DETECTED Final   Carbapenem resistance KPC NOT DETECTED NOT DETECTED Final   Carbapenem resistance NDM NOT DETECTED NOT DETECTED Final   Carbapenem resist OXA 48 LIKE NOT DETECTED NOT DETECTED Final   Carbapenem resistance VIM NOT DETECTED NOT DETECTED Final    Comment: Performed at [facility], [address] [place], [place] [address]  Resp Panel by RT-PCR (Flu A&B, Covid) Nasopharyngeal Swab     Status: None   Collection Time: 01/29/21  7:18 PM   Specimen: Nasopharyngeal Swab; Nasopharyngeal(NP) swabs in vial transport medium  Result Value Ref Range Status   SARS Coronavirus 2 by RT PCR NEGATIVE NEGATIVE Final    Comment: (NOTE) SARS-CoV-2 target nucleic acids are NOT DETECTED.  The SARS-CoV-2 RNA is generally detectable in upper respiratory specimens during the acute phase of infection. The lowest concentration of SARS-CoV-2 viral copies this assay can detect is 138 copies/mL. A negative result does not preclude SARS-Cov-2 infection and should not be used as the sole basis for treatment or other patient management decisions. A  negative result may occur with  improper specimen collection/handling, submission of specimen other than nasopharyngeal swab, presence of viral mutation(s) within the areas targeted by this assay, and inadequate number of  viral copies(<138 copies/mL). A negative result must be combined with clinical observations, patient history, and epidemiological information. The expected result is Negative.  Fact Sheet for Patients:  EntrepreneurPulse.com.au  Fact Sheet for Healthcare Providers:  IncredibleEmployment.be  This test is no t yet approved or cleared by the [place] FDA and  has been authorized for detection and/or diagnosis of SARS-CoV-2 by FDA under an Emergency Use Authorization (EUA). This EUA will remain  in effect (meaning this test can be used) for the duration of the COVID-19 declaration under Section 564(b)(1) of the Act, 21 U.S.C.section 360bbb-3(b)(1), unless the authorization is terminated  or revoked sooner.       Influenza A by PCR NEGATIVE NEGATIVE Final   Influenza B by PCR NEGATIVE NEGATIVE Final    Comment: (NOTE) The Xpert Xpress SARS-CoV-2/FLU/RSV plus assay is intended as an aid in the diagnosis of influenza from Nasopharyngeal swab specimens and should not be used as a sole basis for treatment. Nasal washings and aspirates are unacceptable for Xpert Xpress SARS-CoV-2/FLU/RSV testing.  Fact Sheet for Patients: EntrepreneurPulse.com.au  Fact Sheet for Healthcare Providers: IncredibleEmployment.be  This test is not yet approved or cleared by the [place] FDA and has been authorized for detection and/or diagnosis of SARS-CoV-2 by FDA under an Emergency Use Authorization (EUA). This EUA will remain in effect (meaning this test can be used) for the duration of the COVID-19 declaration under Section 564(b)(1) of the Act, 21 U.S.C. section 360bbb-3(b)(1), unless the authorization is terminated or revoked.  Performed at [facility], [address], [place], [place] [address]   MRSA PCR Screening     Status: None   Collection Time: 01/30/21  1:18 PM   Specimen: Nasal Mucosa; Nasopharyngeal   Result Value Ref Range Status   MRSA by PCR NEGATIVE NEGATIVE Final    Comment:        The GeneXpert MRSA Assay (FDA approved for NASAL specimens only), is one component of a comprehensive MRSA colonization surveillance program. It is not intended to diagnose MRSA infection nor to guide or monitor treatment for MRSA infections. Performed at [facility], [place] [address], [place], [place] [address]      Radiology Studies:  MR Cervical Spine Wo Contrast  Result Date: 01/29/2021 CLINICAL DATA:  Bilateral arm weakness, recent fall EXAM: MRI CERVICAL, THORACIC AND LUMBAR SPINE WITHOUT CONTRAST TECHNIQUE: Multiplanar and multiecho pulse sequences of the cervical spine, to include the craniocervical junction and cervicothoracic junction, and thoracic and lumbar spine, were obtained without intravenous contrast. COMPARISON:  MRI lumbar spine 12/02/2020 FINDINGS: MRI CERVICAL SPINE Alignment: Approximately 4 mm retrolisthesis at C3-C4. Vertebrae: Prior anterior fusion at C4-C6. Appears to be solid bridging bone. Hardware is not well evaluated and there is associated susceptibility artifact. Vertebral body heights are maintained apart from degenerative endplate irregularity at C3-C4 and C6-C7. No substantial marrow edema. No suspicious osseous lesion. Cord: Cord is compressed at C3-C4. There is abnormal cord signal at approximately C4 and C5 levels. Posterior Fossa, vertebral arteries, paraspinal tissues: Unremarkable. Disc levels: C2-C3: Right greater than left uncovertebral and facet hypertrophy. No canal stenosis. Mild to moderate foraminal stenosis, greater on the right. C3-C4: Disc bulge with endplate osteophytes. Uncovertebral hypertrophy. Severe canal stenosis with cord compression. Moderate to marked foraminal stenosis. C4-C5:  Fusion level.  No  canal or foraminal stenosis. C5-C6: Fusion level. No canal stenosis. Spurring may mildly narrow the neural foramina. C6-C7: Disc bulge with endplate  osteophytes. Uncovertebral and facet hypertrophy. Mild canal stenosis. Mild to moderate foraminal stenosis. C7-T1: Endplate osteophytes and facet hypertrophy. No canal or foraminal stenosis. MRI THORACIC SPINE Alignment:  Preserved. Vertebrae: Vertebral body heights are maintained. There is no substantial marrow edema. No suspicious osseous lesion. Cord:  No abnormal signal. Paraspinal and other soft tissues: Unremarkable. Disc levels: Intervertebral disc heights and signal are maintained. Minor degenerative changes are present without stenosis. MRI LUMBAR SPINE Segmentation:  Standard. Alignment:  Stable with trace anterolisthesis at L4-L5. Vertebrae: Stable vertebral body heights. No substantial marrow edema. No suspicious osseous lesion. Conus medullaris and cauda equina: Conus extends to the L1-L2 level. Conus and cauda equina appear normal. Paraspinal and other soft tissues: Unremarkable. Disc levels: L1-L2:  No stenosis. L2-L3:  Facet arthropathy.  No stenosis. L3-L4: Disc bulge. Facet arthropathy. No canal or right foraminal stenosis. Mild left foraminal stenosis. L4-L5: Disc bulge slightly eccentric to the right. Facet arthropathy. Minor canal stenosis with slight effacement of the subarticular recesses. Mild right foraminal stenosis. No left foraminal stenosis. L5-S1: Disc bulge with endplate osteophytic ridging eccentric to the left. Facet arthropathy. No canal stenosis. Partial effacement of the left subarticular recess. No right foraminal stenosis. Moderate to marked left foraminal stenosis. IMPRESSION: Postoperative and multilevel degenerative changes of the cervical spine. There is severe canal stenosis with cord compression at C3-C4. Abnormal cord signal is present just below this level (at operative levels) and may reflect edema or myelomalacia No significant degenerative changes of the thoracic spine. Multilevel degenerative changes of lumbar spine similar to recent prior study. These results were  called by telephone at the time of interpretation on 01/29/2021 at 5:20 pm to provider Dr. Rogene [place], who verbally acknowledged these results. Electronically Signed   By: Macy Mis M.D.   On: 01/29/2021 17:38   MR THORACIC SPINE WO CONTRAST  Result Date: 01/29/2021 CLINICAL DATA:  Bilateral arm weakness, recent fall EXAM: MRI CERVICAL, THORACIC AND LUMBAR SPINE WITHOUT CONTRAST TECHNIQUE: Multiplanar and multiecho pulse sequences of the cervical spine, to include the craniocervical junction and cervicothoracic junction, and thoracic and lumbar spine, were obtained without intravenous contrast. COMPARISON:  MRI lumbar spine 12/02/2020 FINDINGS: MRI CERVICAL SPINE Alignment: Approximately 4 mm retrolisthesis at C3-C4. Vertebrae: Prior anterior fusion at C4-C6. Appears to be solid bridging bone. Hardware is not well evaluated and there is associated susceptibility artifact. Vertebral body heights are maintained apart from degenerative endplate irregularity at C3-C4 and C6-C7. No substantial marrow edema. No suspicious osseous lesion. Cord: Cord is compressed at C3-C4. There is abnormal cord signal at approximately C4 and C5 levels. Posterior Fossa, vertebral arteries, paraspinal tissues: Unremarkable. Disc levels: C2-C3: Right greater than left uncovertebral and facet hypertrophy. No canal stenosis. Mild to moderate foraminal stenosis, greater on the right. C3-C4: Disc bulge with endplate osteophytes. Uncovertebral hypertrophy. Severe canal stenosis with cord compression. Moderate to marked foraminal stenosis. C4-C5:  Fusion level.  No canal or foraminal stenosis. C5-C6: Fusion level. No canal stenosis. Spurring may mildly narrow the neural foramina. C6-C7: Disc bulge with endplate osteophytes. Uncovertebral and facet hypertrophy. Mild canal stenosis. Mild to moderate foraminal stenosis. C7-T1: Endplate osteophytes and facet hypertrophy. No canal or foraminal stenosis. MRI THORACIC SPINE Alignment:  Preserved.  Vertebrae: Vertebral body heights are maintained. There is no substantial marrow edema. No suspicious osseous lesion. Cord:  No abnormal signal. Paraspinal and other soft  tissues: Unremarkable. Disc levels: Intervertebral disc heights and signal are maintained. Minor degenerative changes are present without stenosis. MRI LUMBAR SPINE Segmentation:  Standard. Alignment:  Stable with trace anterolisthesis at L4-L5. Vertebrae: Stable vertebral body heights. No substantial marrow edema. No suspicious osseous lesion. Conus medullaris and cauda equina: Conus extends to the L1-L2 level. Conus and cauda equina appear normal. Paraspinal and other soft tissues: Unremarkable. Disc levels: L1-L2:  No stenosis. L2-L3:  Facet arthropathy.  No stenosis. L3-L4: Disc bulge. Facet arthropathy. No canal or right foraminal stenosis. Mild left foraminal stenosis. L4-L5: Disc bulge slightly eccentric to the right. Facet arthropathy. Minor canal stenosis with slight effacement of the subarticular recesses. Mild right foraminal stenosis. No left foraminal stenosis. L5-S1: Disc bulge with endplate osteophytic ridging eccentric to the left. Facet arthropathy. No canal stenosis. Partial effacement of the left subarticular recess. No right foraminal stenosis. Moderate to marked left foraminal stenosis. IMPRESSION: Postoperative and multilevel degenerative changes of the cervical spine. There is severe canal stenosis with cord compression at C3-C4. Abnormal cord signal is present just below this level (at operative levels) and may reflect edema or myelomalacia No significant degenerative changes of the thoracic spine. Multilevel degenerative changes of lumbar spine similar to recent prior study. These results were called by telephone at the time of interpretation on 01/29/2021 at 5:20 pm to provider Dr. Rogene Houston, who verbally acknowledged these results. Electronically Signed   By: Macy Mis M.D.   On: 01/29/2021 17:38   MR LUMBAR SPINE WO  CONTRAST  Result Date: 01/29/2021 CLINICAL DATA:  Bilateral arm weakness, recent fall EXAM: MRI CERVICAL, THORACIC AND LUMBAR SPINE WITHOUT CONTRAST TECHNIQUE: Multiplanar and multiecho pulse sequences of the cervical spine, to include the craniocervical junction and cervicothoracic junction, and thoracic and lumbar spine, were obtained without intravenous contrast. COMPARISON:  MRI lumbar spine 12/02/2020 FINDINGS: MRI CERVICAL SPINE Alignment: Approximately 4 mm retrolisthesis at C3-C4. Vertebrae: Prior anterior fusion at C4-C6. Appears to be solid bridging bone. Hardware is not well evaluated and there is associated susceptibility artifact. Vertebral body heights are maintained apart from degenerative endplate irregularity at C3-C4 and C6-C7. No substantial marrow edema. No suspicious osseous lesion. Cord: Cord is compressed at C3-C4. There is abnormal cord signal at approximately C4 and C5 levels. Posterior Fossa, vertebral arteries, paraspinal tissues: Unremarkable. Disc levels: C2-C3: Right greater than left uncovertebral and facet hypertrophy. No canal stenosis. Mild to moderate foraminal stenosis, greater on the right. C3-C4: Disc bulge with endplate osteophytes. Uncovertebral hypertrophy. Severe canal stenosis with cord compression. Moderate to marked foraminal stenosis. C4-C5:  Fusion level.  No canal or foraminal stenosis. C5-C6: Fusion level. No canal stenosis. Spurring may mildly narrow the neural foramina. C6-C7: Disc bulge with endplate osteophytes. Uncovertebral and facet hypertrophy. Mild canal stenosis. Mild to moderate foraminal stenosis. C7-T1: Endplate osteophytes and facet hypertrophy. No canal or foraminal stenosis. MRI THORACIC SPINE Alignment:  Preserved. Vertebrae: Vertebral body heights are maintained. There is no substantial marrow edema. No suspicious osseous lesion. Cord:  No abnormal signal. Paraspinal and other soft tissues: Unremarkable. Disc levels: Intervertebral disc heights and  signal are maintained. Minor degenerative changes are present without stenosis. MRI LUMBAR SPINE Segmentation:  Standard. Alignment:  Stable with trace anterolisthesis at L4-L5. Vertebrae: Stable vertebral body heights. No substantial marrow edema. No suspicious osseous lesion. Conus medullaris and cauda equina: Conus extends to the L1-L2 level. Conus and cauda equina appear normal. Paraspinal and other soft tissues: Unremarkable. Disc levels: L1-L2:  No stenosis. L2-L3:  Facet arthropathy.  No stenosis. L3-L4: Disc bulge. Facet arthropathy. No canal or right foraminal stenosis. Mild left foraminal stenosis. L4-L5: Disc bulge slightly eccentric to the right. Facet arthropathy. Minor canal stenosis with slight effacement of the subarticular recesses. Mild right foraminal stenosis. No left foraminal stenosis. L5-S1: Disc bulge with endplate osteophytic ridging eccentric to the left. Facet arthropathy. No canal stenosis. Partial effacement of the left subarticular recess. No right foraminal stenosis. Moderate to marked left foraminal stenosis. IMPRESSION: Postoperative and multilevel degenerative changes of the cervical spine. There is severe canal stenosis with cord compression at C3-C4. Abnormal cord signal is present just below this level (at operative levels) and may reflect edema or myelomalacia No significant degenerative changes of the thoracic spine. Multilevel degenerative changes of lumbar spine similar to recent prior study. These results were called by telephone at the time of interpretation on 01/29/2021 at 5:20 pm to provider Dr. Rogene Houston, who verbally acknowledged these results. Electronically Signed   By: Macy Mis M.D.   On: 01/29/2021 17:38   US RENAL  Result Date: 01/30/2021 CLINICAL DATA:  Acute kidney injury. EXAM: RENAL / URINARY TRACT ULTRASOUND COMPLETE COMPARISON:  CT abdomen pelvis dated Jan 26, 2021. FINDINGS: Right Kidney: Renal measurements: 10.6 x 5.1 x 5.6 cm = volume: 157 mL.  Echogenicity within normal limits. No mass or hydronephrosis visualized. Small amount of perinephric fluid. Left Kidney: Renal measurements: 10.4 x 6.2 x 7.0 cm = volume: 232 mL. Echogenicity within normal limits. No mass or hydronephrosis visualized. Bladder: Decompressed by Foley catheter. Other: None. IMPRESSION: 1. No acute abnormality. Small amount of right perinephric fluid, nonspecific. Electronically Signed   By: Titus Dubin M.D.   On: 01/30/2021 11:15   DG Chest Port 1 View  Result Date: 01/31/2021 CLINICAL DATA:  85 year old female with abnormal respiration. EXAM: PORTABLE CHEST 1 VIEW COMPARISON:  Portable chest 01/25/2021. FINDINGS: Portable AP view at 0337 hours. Rotated to the right now. Lung volumes and mediastinal contours are stable. Visualized tracheal air column is within normal limits. Allowing for portable technique the lungs are clear. No pneumothorax or pleural effusion. ACDF hardware visible. IMPRESSION: No acute cardiopulmonary abnormality when allowing for rotated patient positioning. Electronically Signed   By: Genevie Ann M.D.   On: 01/31/2021 07:22     Scheduled Meds:   . atorvastatin  20 mg Oral QPM  . Chlorhexidine Gluconate Cloth  6 each Topical Daily  . insulin aspart  0-15 Units Subcutaneous Q4H  . pantoprazole  40 mg Oral Daily  . polyethylene glycol  17 g Oral Daily  . senna  2 tablet Oral Daily    Continuous Infusions:   . sodium chloride 10 mL/hr at 01/31/21 1000  . sodium chloride    . cefTRIAXone (ROCEPHIN)  IV Stopped (01/31/21 0939)  . norepinephrine (LEVOPHED) Adult infusion 3 mcg/min (01/31/21 1000)  .  sodium bicarbonate (isotonic) infusion in sterile water 100 mL/hr at 01/31/21 1242     LOS: 2 days     Vernell Leep, MD, [place], [facility]. [facility]    To contact the attending provider between 7A-7P or the covering provider during after hours 7P-7A, please log into the web site [email or web address] and access using universal [place]  password for that web site. If you do not have the password, please call the hospital operator.  01/31/2021, 1:02 PM

## 2021-01-31 NOTE — Progress Notes (Signed)
NAME:  [name], MRN:  [id number], DOB:  01-Feb-1936, LOS: 2 ADMISSION DATE:  01/26/2021, CONSULTATION DATE:  01/30/2021 REFERRING MD:  Dr. Algis Liming, CHIEF COMPLAINT:  Fall, weakness    History of Present Illness:  [name] is a 85 y.o. female with a PMX significant for neuropathy, neck pain, HLD, and diabetes who [facility] to [facility] on 01/26/2021 after a fall down 2 stairs.  Imaging in ED showing no fractures. CT on 5/29 showing disc space narrowing and endplate irregularity and osteophyte at C3-C4. MRI on 6/1 showing severe canal stenosis with cord compression at C3-C4. Labs showing Acute on CKD and hyponatremia. UA suggestive of UTI. Started on Ceftriaxone. Neurosurgeon called.  Patient transferred to [facility] on 6/2. Patient arrived hypotensive and AMS. PCCM consulted for management of hypotension. Will admit to 4n ICU.  Pertinent  Medical History   Past Medical History:  Diagnosis Date  . Diabetes mellitus without complication ([place])   . High cholesterol   . Neck pain   . Neuropathy      Significant Hospital Events: Including procedures, antibiotic start and stop dates in addition to other pertinent events   . 5/29: Admitted to [facility] for fall . 6/2: Transferred to [facility] and will admit to 4N ICU due to hypotension. IV fluids and bolus's given. GNR in blood and GNR UTI. On rocephin  . 6/3 on minimal levophed. Awaiting NSGY plan. Worsening limb weakness   Interim History / Subjective:   On 2NE for MAP goal 70   GNR in blood   Feels worse today.   Objective   Blood pressure (!) 94/40, pulse (!) 111, temperature (!) 101.7 F (38.7 C), temperature source Axillary, resp. rate (!) 28, height '5\' 2"'$  (1.575 m), weight 74.8 kg, SpO2 92 %.        Intake/Output Summary (Last 24 hours) at 01/31/2021 1014 Last data filed at 01/31/2021 0800 Gross per 24 hour  Intake 5063.16 ml  Output 1100 ml  Net 3963.16 ml   Filed Weights   01/26/21 0848  Weight: 74.8 kg     Examination: General: Ill appearing elderly F reclined in bed uncomfortable but NAD  HEENT: NCAT dry mm anicteric sclera Neuro: BLE 1/5. RUE4/5 LUE 3/5. Following commands. PERRLA. + sensation BUE BLE  CV: tachycardic, regular s1s2 cap refill < 3 sec  PULM: Symmetrical chest expansion, even and unlabored. CTA GI: soft round ndnt + bowel sounds [id number], yellow urine  Extremities: no acute joint deformity no cyanosis or clubbing  Skin: c/d/w no rash    Labs/imaging that I havepersonally reviewed  (right click and "Reselect all SmartList Selections" daily)     CT cervical spine 5/28: Multilevel degenerative changes. There is disc space narrowing and endplate irregularity and osteophyte at C3-C4. C4-C6 ACDF.  MRI cervical/lumbar spine 6/1: severe canal stenosis with cord compression at C3-C4. Abnormal cord signal is present just below this level (at operative levels) and may reflect edema or myelomalacia  6/3 labs not yet resulted at time of review  Resolved Hospital Problem list     Assessment & Plan:   Acute metabolic encephalopathy -infection, poor sleep, ?delirium, med side effects, dehydration Plan: -minimizing CNS depressing meds -cont rocephin -delirium precautions   Severe C3-C4 stenosis with cord compression S/p prior ACDF  - MRI 6/1: severe canal stenosis with cord compression at C3-C4. -unfortunately despite adequate perfusion, physical exam is worse 6/3 Plan: -Neurosurgery consulted  -MAP >70. NE available if needed   Shock --  septic shock and hypovolemic shock  GNR bacteremia UTI  Plan: -rocephin -cont LR 125/hr  Hyponatremia: likely hypovolemic Plan: -nephro following -6/3 BMP still pending, follow up   AKI on CKD III Urinary retention in setting of cord compression Plan: -continue foley  -trend UOP, renal indices   Hx DM  Plan: -SSI and CBG monitoring  Hx HTN Hx HLD Plan: -Hold home meds while hypotensive -Continue statin  Best  practice (right click and "Reselect all SmartList Selections" daily)  Diet:  NPO Pain/Anxiety/Delirium protocol (if indicated): No VAP protocol (if indicated): Not indicated DVT prophylaxis: SCD GI prophylaxis: PPI Glucose control:  SSI Yes Central venous access:  N/A Arterial line:  N/A Foley:  Yes, and it is still needed Mobility:  bed rest  PT consulted: N/A Last date of multidisciplinary goals of care discussion [pending] Code Status:  full code Disposition: ICU  Labs   CBC: Recent Labs  Lab 01/25/21 2050 01/29/21 1505 01/30/21 0410 01/31/21 0905  WBC 6.4 10.9* 11.6* 13.1*  NEUTROABS 3.0 9.4*  --  PENDING  HGB 10.6* 9.8* 8.8* 8.3*  HCT 31.7* 29.0* 25.3* 23.6*  MCV 91.1 91.2 87.5 88.1  PLT 227 175 138* 0000000    Basic Metabolic Panel: Recent Labs  Lab 01/29/21 1505 01/30/21 0410 01/30/21 1302 01/31/21 0905 01/31/21 0906  NA 124* 124* 128* 128* 125*  K 5.0 4.7 4.7 5.1 4.8  CL 94* 93* 101 102 98  CO2 19* 16* 15* 13* 13*  GLUCOSE 208* 142* 110* 155* 162*  BUN 67* 72* 72* 69* 67*  CREATININE 2.47* 2.75* 2.85* 2.67* 2.77*  CALCIUM 8.1* 7.7* 7.4* 7.6* 7.5*  MG  --   --   --  1.5*  --   PHOS  --   --   --  4.5 4.4   GFR: Estimated Creatinine Clearance: 14.8 mL/min (A) (by C-G formula based on SCr of 2.77 mg/dL (H)). Recent Labs  Lab 01/25/21 2050 01/29/21 1505 01/29/21 1730 01/30/21 0410 01/30/21 1157 01/30/21 1302 01/31/21 0905  PROCALCITON  --   --   --   --   --  101.37  --   WBC 6.4 10.9*  --  11.6*  --   --  13.1*  LATICACIDVEN  --   --  1.4  --  1.3 1.7  --     Liver Function Tests: Recent Labs  Lab 01/25/21 2050 01/31/21 0906  AST 22  --   ALT 16  --   ALKPHOS 56  --   BILITOT 0.3  --   PROT 6.4*  --   ALBUMIN 3.5 2.5*   No results for input(s): LIPASE, AMYLASE in the last 168 hours. No results for input(s): AMMONIA in the last 168 hours.  ABG No results found for: PHART, PCO2ART, PO2ART, HCO3, TCO2, ACIDBASEDEF, O2SAT   Coagulation  Profile: Recent Labs  Lab 01/30/21 1157  INR 1.6*    Cardiac Enzymes: No results for input(s): CKTOTAL, CKMB, CKMBINDEX, TROPONINI in the last 168 hours.  HbA1C: Hgb A1c MFr Bld  Date/Time Value Ref Range Status  12/02/2020 07:11 AM 8.6 (H) 4.8 - 5.6 % Final    Comment:    (NOTE) Pre diabetes:          5.7%-6.4%  Diabetes:              >6.4%  Glycemic control for   <7.0% adults with diabetes   08/17/2019 03:11 PM 10.0 (H) 4.8 - 5.6 % Final  Comment:             Prediabetes: 5.7 - 6.4          Diabetes: >6.4          Glycemic control for adults with diabetes: <7.0     CBG: Recent Labs  Lab 01/30/21 1536 01/30/21 1935 01/30/21 2319 01/31/21 0316 01/31/21 0739  GLUCAP 115* 196* 202* 195* 170*    CRITICAL CARE Performed by: Cristal Generous   Total critical care time: 38 minutes  Critical care time was exclusive of separately billable procedures and treating other patients. Critical care was necessary to treat or prevent imminent or life-threatening deterioration.  Critical care was time spent personally by me on the following activities: development of treatment plan with patient and/or surrogate as well as nursing, discussions with consultants, evaluation of patient's response to treatment, examination of patient, obtaining history from patient or surrogate, ordering and performing treatments and interventions, ordering and review of laboratory studies, ordering and review of radiographic studies, pulse oximetry and re-evaluation of patient's condition.  Eliseo Gum MSN, AGACNP-BC [place] for pager 01/31/2021, 10:14 AM

## 2021-02-01 DIAGNOSIS — A414 Sepsis due to anaerobes: Secondary | ICD-10-CM | POA: Diagnosis not present

## 2021-02-01 DIAGNOSIS — N3000 Acute cystitis without hematuria: Secondary | ICD-10-CM

## 2021-02-01 DIAGNOSIS — A419 Sepsis, unspecified organism: Secondary | ICD-10-CM | POA: Diagnosis not present

## 2021-02-01 DIAGNOSIS — G9520 Unspecified cord compression: Secondary | ICD-10-CM | POA: Diagnosis not present

## 2021-02-01 DIAGNOSIS — R6520 Severe sepsis without septic shock: Secondary | ICD-10-CM

## 2021-02-01 DIAGNOSIS — R7881 Bacteremia: Secondary | ICD-10-CM | POA: Diagnosis not present

## 2021-02-01 DIAGNOSIS — N179 Acute kidney failure, unspecified: Secondary | ICD-10-CM | POA: Diagnosis not present

## 2021-02-01 LAB — BASIC METABOLIC PANEL
Anion gap: 13 (ref 5–15)
BUN: 58 mg/dL — ABNORMAL HIGH (ref 8–23)
CO2: 17 mmol/L — ABNORMAL LOW (ref 22–32)
Calcium: 7.1 mg/dL — ABNORMAL LOW (ref 8.9–10.3)
Chloride: 92 mmol/L — ABNORMAL LOW (ref 98–111)
Creatinine, Ser: 2.46 mg/dL — ABNORMAL HIGH (ref 0.44–1.00)
GFR, Estimated: 19 mL/min — ABNORMAL LOW (ref >60)
Glucose, Bld: 242 mg/dL — ABNORMAL HIGH (ref 70–99)
Potassium: 4.2 mmol/L (ref 3.5–5.1)
Sodium: 122 mmol/L — ABNORMAL LOW (ref 135–145)

## 2021-02-01 LAB — CBC
HCT: 22.3 % — ABNORMAL LOW (ref 36.0–46.0)
Hemoglobin: 7.8 g/dL — ABNORMAL LOW (ref 12.0–15.0)
MCH: 30.5 pg (ref 26.0–34.0)
MCHC: 35 g/dL (ref 30.0–36.0)
MCV: 87.1 fL (ref 80.0–100.0)
Platelets: 132 10*3/uL — ABNORMAL LOW (ref 150–400)
RBC: 2.56 MIL/uL — ABNORMAL LOW (ref 3.87–5.11)
RDW: 14.3 % (ref 11.5–15.5)
WBC: 10.7 10*3/uL — ABNORMAL HIGH (ref 4.0–10.5)
nRBC: 0 % (ref 0.0–0.2)

## 2021-02-01 LAB — RENAL FUNCTION PANEL
Albumin: 2.1 g/dL — ABNORMAL LOW (ref 3.5–5.0)
Anion gap: 10 (ref 5–15)
BUN: 54 mg/dL — ABNORMAL HIGH (ref 8–23)
CO2: 22 mmol/L (ref 22–32)
Calcium: 7.3 mg/dL — ABNORMAL LOW (ref 8.9–10.3)
Chloride: 93 mmol/L — ABNORMAL LOW (ref 98–111)
Creatinine, Ser: 2.28 mg/dL — ABNORMAL HIGH (ref 0.44–1.00)
GFR, Estimated: 21 mL/min — ABNORMAL LOW (ref >60)
Glucose, Bld: 166 mg/dL — ABNORMAL HIGH (ref 70–99)
Phosphorus: 3.1 mg/dL (ref 2.5–4.6)
Potassium: 4.1 mmol/L (ref 3.5–5.1)
Sodium: 125 mmol/L — ABNORMAL LOW (ref 135–145)

## 2021-02-01 LAB — GLUCOSE, CAPILLARY
Glucose-Capillary: 193 mg/dL — ABNORMAL HIGH (ref 70–99)
Glucose-Capillary: 156 mg/dL — ABNORMAL HIGH (ref 70–99)
Glucose-Capillary: 148 mg/dL — ABNORMAL HIGH (ref 70–99)
Glucose-Capillary: 139 mg/dL — ABNORMAL HIGH (ref 70–99)
Glucose-Capillary: 247 mg/dL — ABNORMAL HIGH (ref 70–99)
Glucose-Capillary: 223 mg/dL — ABNORMAL HIGH (ref 70–99)

## 2021-02-01 LAB — CULTURE, BLOOD (ROUTINE X 2)
Special Requests: ADEQUATE
Special Requests: ADEQUATE

## 2021-02-01 LAB — URINE CULTURE: Culture: >=100000 — AB

## 2021-02-01 LAB — SEDIMENTATION RATE: Sed Rate: 137 mm/hr — ABNORMAL HIGH (ref 0–22)

## 2021-02-01 LAB — HEPATITIS C ANTIBODY: HCV Ab: NONREACTIVE

## 2021-02-01 LAB — MAGNESIUM: Magnesium: 1.5 mg/dL — ABNORMAL LOW (ref 1.7–2.4)

## 2021-02-01 LAB — PROCALCITONIN: Procalcitonin: 75.57 ng/mL

## 2021-02-01 LAB — C-REACTIVE PROTEIN: CRP: 30.3 mg/dL — ABNORMAL HIGH (ref <1.0)

## 2021-02-01 LAB — HEPATITIS B SURFACE ANTIGEN: Hepatitis B Surface Ag: NONREACTIVE

## 2021-02-01 MED ORDER — METHOCARBAMOL 500 MG PO TABS
500.0000 mg | ORAL_TABLET | Freq: Three times a day (TID) | ORAL | Status: DC | PRN
  Administered 2021-02-01 – 2021-02-12 (×13): 500 mg via ORAL
  Filled 2021-02-01 (×16): qty 1

## 2021-02-01 MED ORDER — MAGNESIUM SULFATE 2 GM/50ML IV SOLN
2.0000 g | Freq: Once | INTRAVENOUS | Status: AC
  Administered 2021-02-01: 2 g via INTRAVENOUS
  Filled 2021-02-01: qty 50

## 2021-02-01 NOTE — Progress Notes (Signed)
[facility] signoff note   The care of this patient has been transferred from the [facility] service to the following service:  Service: PCCM Attending: Dr. Lynetta Mare. I have discussed with Eliseo Gum, NP/PCCM Date & time: 02/01/2021, 3:01 PM   Please re consult the [facility] team for any further assistance.  Thank you  [employer]. MD [facility]   To contact a [place] provider, please log into the web site [email or web address] and access using universal [place] password for that web site. If you do not have the password, please call the hospital operator. Page "[place]" listed on top of the page. Provide a number where you can be directly reached.  Progress Note:  I evaluated patient this morning with her CNA daughter, PCCM NP and patient's RN at bedside.  Overnight events noted.  New onset transient A. fib with RVR in the 160s for a couple of hours, started on amiodarone drip, reverted to ST/SR.  Patient is alert, oriented x2, mental status has improved, but still remains somewhat confused, frustrated by lying in bed/uncomfortable, weakness of all extremities but mostly focused on upper extremity weakness even though lower extremities are worse.  Continues to spike fevers up to 102.9 this morning.  Remains off vasopressors since yesterday.  Quadriplegic with lower extremities weaker than upper extremities.  Ongoing AKI (creatinine down from 2.46-2.28), multiple electrolyte abnormalities including hyponatremia/hypomagnesemia.  Multisystem organ failure (septic, AKI, A. fib, encephalopathy, cord compression with quadriplegia).  Remains at very high risk for decline.  I discussed this with patient's daughter at bedside who verbalized understanding.  I discussed in detail with Dr. Saintclair Halsted, Neurosurgery and Ms. Bowser.  Patient currently not a surgical candidate and according to Dr. Saintclair Halsted, prognosis even with surgery for cervical decompression is very poor  and recommends palliative care consultation for goals of care- CCM to call.  If and when surgery considered post stabilization, may need 2D echo and Cardiology preop clearance.  Vernell Leep, MD, [place], [facility]. [facility]  To contact the attending provider between 7A-7P or the covering provider during after hours 7P-7A, please log into the web site [email or web address] and access using universal [place] password for that web site. If you do not have the password, please call the hospital operator.

## 2021-02-01 NOTE — Progress Notes (Signed)
Patient ID: [name], female   DOB: [birth year], 85 y.o.   MRN: [id number] [facility] Progress Note   Assessment/ Plan:   1.  Acute kidney injury on chronic kidney disease stage III: This appears to be possibly from 2 insults; likely initiated by obstruction and now exacerbated by hypotension with likely ischemic ATN.  Nonoliguric overnight with sluggish improvement of renal function.  She does not have any acute indications for dialysis at this time.  2.  Hyponatremia: Likely secondary to impaired free water handling in the setting of acute kidney injury.  Monitor with isotonic fluids and limit oral intake to <1.2 L a day. 3.  Anion gap metabolic acidosis: Secondary to acute kidney injury, continue isotonic sodium bicarbonate at this time with subsequent lab monitoring. 4.  Cervical cord compression from spinal stenosis: Neurosurgery saw her yesterday with overall poor prognosis projected even with surgical intervention based on density of quadriparesis.  Not appropriate for surgery at this time with fever/AKI. 5.  Urinary tract infection with sepsis-Klebsiella pneumonia: She continues to have fever on ceftriaxone with repeat cultures drawn per ID.  Subjective:   Transferred to ICU yesterday for continued hypotension/sepsis-started on Levophed.  She remained febrile overnight.   Objective:   BP (!) 109/56   Pulse 92   Temp (!) 102 F (38.9 C)   Resp (!) 24   Ht '5\' 2"'$  (1.575 m)   Wt 74.8 kg   SpO2 96%   BMI 30.18 kg/m   Intake/Output Summary (Last 24 hours) at 02/01/2021 0950 Last data filed at 02/01/2021 0900 Gross per 24 hour  Intake 3970.92 ml  Output 1300 ml  Net 2670.92 ml   Weight change:   Physical Exam: Gen: Comfortably resting in bed, awakens briefly to calling out her name CVS: Pulse regular rhythm, normal rate, S1 and S2 with ejection systolic murmur Resp: Clear to auscultation bilaterally without rales/rhonchi Abd: Soft, flat, nontender Ext:  Trace-1+ lower extremity edema  Imaging: US RENAL  Result Date: 01/30/2021 CLINICAL DATA:  Acute kidney injury. EXAM: RENAL / URINARY TRACT ULTRASOUND COMPLETE COMPARISON:  CT abdomen pelvis dated Jan 26, 2021. FINDINGS: Right Kidney: Renal measurements: 10.6 x 5.1 x 5.6 cm = volume: 157 mL. Echogenicity within normal limits. No mass or hydronephrosis visualized. Small amount of perinephric fluid. Left Kidney: Renal measurements: 10.4 x 6.2 x 7.0 cm = volume: 232 mL. Echogenicity within normal limits. No mass or hydronephrosis visualized. Bladder: Decompressed by Foley catheter. Other: None. IMPRESSION: 1. No acute abnormality. Small amount of right perinephric fluid, nonspecific. Electronically Signed   By: Titus Dubin M.D.   On: 01/30/2021 11:15   DG Chest Port 1 View  Result Date: 01/31/2021 CLINICAL DATA:  85 year old female with abnormal respiration. EXAM: PORTABLE CHEST 1 VIEW COMPARISON:  Portable chest 01/25/2021. FINDINGS: Portable AP view at 0337 hours. Rotated to the right now. Lung volumes and mediastinal contours are stable. Visualized tracheal air column is within normal limits. Allowing for portable technique the lungs are clear. No pneumothorax or pleural effusion. ACDF hardware visible. IMPRESSION: No acute cardiopulmonary abnormality when allowing for rotated patient positioning. Electronically Signed   By: Genevie Ann M.D.   On: 01/31/2021 07:22    Labs: BMET Recent Labs  Lab 01/29/21 1505 01/30/21 0410 01/30/21 1302 01/31/21 0905 01/31/21 0906 01/31/21 2358 02/01/21 0504  NA 124* 124* 128* 128* 125* 122* 125*  K 5.0 4.7 4.7 5.1 4.8 4.2 4.1  CL 94* 93* 101 102 98 92* 93*  CO2 19* 16* 15* 13* 13* 17* 22  GLUCOSE 208* 142* 110* 155* 162* 242* 166*  BUN 67* 72* 72* 69* 67* 58* 54*  CREATININE 2.47* 2.75* 2.85* 2.67* 2.77* 2.46* 2.28*  CALCIUM 8.1* 7.7* 7.4* 7.6* 7.5* 7.1* 7.3*  PHOS  --   --   --  4.5 4.4  --  3.1   CBC Recent Labs  Lab 01/25/21 2050 01/29/21 1505  01/30/21 0410 01/31/21 0905 02/01/21 0504  WBC 6.4 10.9* 11.6* 13.1* 10.7*  NEUTROABS 3.0 9.4*  --  11.3*  --   HGB 10.6* 9.8* 8.8* 8.3* 7.8*  HCT 31.7* 29.0* 25.3* 23.6* 22.3*  MCV 91.1 91.2 87.5 88.1 87.1  PLT 227 175 138* 153 132*   Medications:    . atorvastatin  20 mg Oral QPM  . Chlorhexidine Gluconate Cloth  6 each Topical Daily  . insulin aspart  0-15 Units Subcutaneous Q4H  . pantoprazole  40 mg Oral Daily  . polyethylene glycol  17 g Oral Daily  . senna  2 tablet Oral Daily   Elmarie Shiley, MD 02/01/2021, 9:50 AM

## 2021-02-01 NOTE — Progress Notes (Signed)
Subjective: Patient reports No real complaints this morning  Objective: Vital signs in last 24 hours: Temp:  [98.5 F (36.9 C)-100.9 F (38.3 C)] 100.2 F (37.9 C) (06/04 0400) Pulse Rate:  [88-146] 100 (06/04 0700) Resp:  [18-35] 24 (06/04 0600) BP: (96-156)/(47-116) 134/63 (06/04 0700) SpO2:  [91 %-100 %] 97 % (06/04 0700)  Intake/Output from previous day: 06/03 0701 - 06/04 0700 In: 4099.9 [P.O.:1000; I.V.:2999.8; IV Piggyback:100.1] Out: 1300 [Urine:1300] Intake/Output this shift: No intake/output data recorded.  Dense quadriparesis weak grip 2 out of 5 bicep function 3 out of 5 lower extremities can wiggle toes 1-2 out of 5 has 2 out of 5 left quad function 1 out of 5 right quad function 0-1 iliopsoas.  Lab Results: Recent Labs    01/31/21 0905 02/01/21 0504  WBC 13.1* 10.7*  HGB 8.3* 7.8*  HCT 23.6* 22.3*  PLT 153 132*   BMET Recent Labs    01/31/21 2358 02/01/21 0504  NA 122* 125*  K 4.2 4.1  CL 92* 93*  CO2 17* 22  GLUCOSE 242* 166*  BUN 58* 54*  CREATININE 2.46* 2.28*  CALCIUM 7.1* 7.3*    Studies/Results: US RENAL  Result Date: 01/30/2021 CLINICAL DATA:  Acute kidney injury. EXAM: RENAL / URINARY TRACT ULTRASOUND COMPLETE COMPARISON:  CT abdomen pelvis dated Jan 26, 2021. FINDINGS: Right Kidney: Renal measurements: 10.6 x 5.1 x 5.6 cm = volume: 157 mL. Echogenicity within normal limits. No mass or hydronephrosis visualized. Small amount of perinephric fluid. Left Kidney: Renal measurements: 10.4 x 6.2 x 7.0 cm = volume: 232 mL. Echogenicity within normal limits. No mass or hydronephrosis visualized. Bladder: Decompressed by Foley catheter. Other: None. IMPRESSION: 1. No acute abnormality. Small amount of right perinephric fluid, nonspecific. Electronically Signed   By: Titus Dubin M.D.   On: 01/30/2021 11:15   DG Chest Port 1 View  Result Date: 01/31/2021 CLINICAL DATA:  85 year old female with abnormal respiration. EXAM: PORTABLE CHEST 1 VIEW  COMPARISON:  Portable chest 01/25/2021. FINDINGS: Portable AP view at 0337 hours. Rotated to the right now. Lung volumes and mediastinal contours are stable. Visualized tracheal air column is within normal limits. Allowing for portable technique the lungs are clear. No pneumothorax or pleural effusion. ACDF hardware visible. IMPRESSION: No acute cardiopulmonary abnormality when allowing for rotated patient positioning. Electronically Signed   By: Genevie Ann M.D.   On: 01/31/2021 07:22    Assessment/Plan: Dense quadriparesis however septic and new onset A. fib.  Multiple medical comorbidities too high risk for surgery at this point prognosis even with surgery for cervical decompression is very poor with this dense quadriparesis.  Do recommend consider palliative care consult and supportive care at this point treat the underlying infection and treat the atrial fibrillation and go from there.  LOS: 3 days     [name] 02/01/2021, 8:18 AM

## 2021-02-01 NOTE — Progress Notes (Signed)
NAME:  [name], MRN:  [id number], DOB:  Jul 16, 1936, LOS: 3 ADMISSION DATE:  01/26/2021, CONSULTATION DATE:  01/30/2021 REFERRING MD:  Dr. Algis Liming, CHIEF COMPLAINT:  Fall, weakness    History of Present Illness:  [name] is a 85 y.o. female with a PMX significant for neuropathy, neck pain, HLD, and diabetes who present to [facility] on 01/26/2021 after a fall down 2 stairs.  Imaging in ED showing no fractures. CT on 5/29 showing disc space narrowing and endplate irregularity and osteophyte at C3-C4. MRI on 6/1 showing severe canal stenosis with cord compression at C3-C4. Labs showing Acute on CKD and hyponatremia. UA suggestive of UTI. Started on Ceftriaxone. Neurosurgeon called.  Patient transferred to [facility] on 6/2. Patient arrived hypotensive and AMS. PCCM consulted for management of hypotension. Will admit to 4n ICU.  Pertinent  Medical History   Past Medical History:  Diagnosis Date  . Diabetes mellitus without complication ([place])   . High cholesterol   . Neck pain   . Neuropathy      Significant Hospital Events: Including procedures, antibiotic start and stop dates in addition to other pertinent events   . 5/29: Admitted to [facility] for fall . 6/2: Transferred to [facility] and will admit to 4N ICU due to hypotension. IV fluids and bolus's given. GNR in blood and GNR UTI. On rocephin  . 6/3 on minimal levophed. Awaiting NSGY plan. Worsening limb weakness  . 6/4 off NE. Started on amio overnight. BCx and Urine with klebsiella pneumoniae   Interim History / Subjective:  Off pressors this morning Did go into Afib RVR over night, started on amio   Complaining of muscle cramping today and is frustrated that she is not ambulating  Objective   Blood pressure (!) 109/56, pulse 87, temperature (!) 102 F (38.9 C), resp. rate 19, height _0  (1.575 m), weight 74.8 kg, SpO2 97 %.        Intake/Output Summary (Last 24 hours) at 02/01/2021 1146 Last data filed at  02/01/2021 1000 Gross per 24 hour  Intake 3530.15 ml  Output 1000 ml  Net 2530.15 ml   Filed Weights   01/26/21 0848  Weight: 74.8 kg    Examination:  General: Ill appearing elderly F, reclined in bed in moderate discomfort  HEENT: NCAT pink tacky mm anicteric sclera  Neuro: Awake, alert confused. Following commands. 3/5 upper extremities 1/5 lower extremities PERRL CV: s1s2 no rgm cap refill brisk  PULM: CTAb symmetrical chest expansion, even unlabored  GI: soft round ndnt + bowel sounds  GU: foley, yellow urine  Extremities: no acute joint deformity, no clubbing, no cyanosis  Skin: c/d/w no rash    Labs/imaging that I havepersonally reviewed  (right click and "Reselect all SmartList Selections" daily)    CT cervical spine 5/28: Multilevel degenerative changes. There is disc space narrowing and endplate irregularity and osteophyte at C3-C4. C4-C6 ACDF.  MRI cervical/lumbar spine 6/1: severe canal stenosis with cord compression at C3-C4. Abnormal cord signal is present just below this level (at operative levels) and may reflect edema or myelomalacia  6/4-  Na 125 BUN54 Cr 2.28 Mag 1.5 CRP 30 ESR 137 PCT 75 WBC 10 hgb 7.8  Resolved Hospital Problem list     Assessment & Plan:   Acute metabolic encephalopathy -infection, poor sleep, ?delirium, med side effects, dehydration Acute pain, muscle spasms  -prior to admission pretty ambulatory.  Plan: -trying to minimize CNS depressing meds as able  -cont  rocephin -delirium precautions   Severe C3-C4 stenosis with cord compression S/p prior ACDF  - MRI 6/1: severe canal stenosis with cord compression at C3-C4. -unfortunately despite adequate perfusion, physical exam is worse 6/3 Plan: -MAP  >70, NE if needed  -NSGY following, not stable for a decompression at present. - Discussed placing a palliative care consult with Dr. Saintclair Halsted and Dr. Christella Noa -- both are in agreement this is appropriate   Severe sepsis due to klebsiella  pneumoniae bacteremia, klebsiella pneumoniae UTI  (shock improved)  Plan: -ID following, rocephin -cont IVF -NE available for MAP goal > 70  AKI on CKD III  -Cr continuing to downtrend  NAGMA  Urinary retention in setting of cord compression  Plan: -continue foley  -trend UOP, renal indices  -bicarb gtt per nephro  Afib RVR Plan: -Amio  -Mag goal 2 K goal 4   Hyponatremia -likely in setting of AKI  Hypomagnesemia  Plan: -nephro following for Na   -replace mag, goal >2   Hx DM  Plan: -SSI and CBG monitoring  Hx HTN Hx HLD  Plan: -Hold home meds while hypotensive -Continue statin  Goals of Care  -Difficult case. Pt is not currently an operative candidate for her cervical cord compression with dense quadriparesis due to concomitant medical problems (severe sepsis, new afib rvr). I am also concerned for a possible ischemic component with worsening of neuro exam despite aggressive hemodynamic support. In discussion with NSGY, it sounds exceedingly unlikely that the patient will recover to prior functional status even with surgery should she become medically appropriate for the OR.  -I am consulting palliative care. 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  Code Status:  full code Disposition: ICU  Labs   CBC: Recent Labs  Lab 01/25/21 2050 01/29/21 1505 01/30/21 0410 01/31/21 0905 02/01/21 0504  WBC 6.4 10.9* 11.6* 13.1* 10.7*  NEUTROABS 3.0 9.4*  --  11.3*  --   HGB 10.6* 9.8* 8.8* 8.3* 7.8*  HCT 31.7* 29.0* 25.3* 23.6* 22.3*  MCV 91.1 91.2 87.5 88.1 87.1  PLT 227 175 138* 153 132*    Basic Metabolic Panel: Recent Labs  Lab 01/30/21 1302 01/31/21 0905 01/31/21 0906 01/31/21 2358 02/01/21 0504  NA 128* 128* 125* 122* 125*  K 4.7 5.1 4.8 4.2 4.1  CL 101 102 98 92* 93*  CO2 15* 13* 13* 17* 22  GLUCOSE 110* 155* 162* 242* 166*  BUN 72* 69* 67* 58* 54*  CREATININE 2.85* 2.67* 2.77* 2.46* 2.28*  CALCIUM 7.4* 7.6* 7.5* 7.1* 7.3*  MG  --  1.5*  --  1.5*  --   PHOS  --  4.5 4.4  --  3.1   GFR: Estimated Creatinine Clearance: 18  mL/min (A) (by C-G formula based on SCr of 2.28 mg/dL (H)). Recent Labs  Lab 01/29/21 1505 01/29/21 1730 01/30/21 0410 01/30/21 1157 01/30/21 1302 01/31/21 0905 02/01/21 0504  PROCALCITON  --   --   --   --  101.37 102.20 75.57  WBC 10.9*  --  11.6*  --   --  13.1* 10.7*  LATICACIDVEN  --  1.4  --  1.3 1.7  --   --     Liver Function Tests: Recent Labs  Lab 01/25/21 2050 01/31/21 0906 02/01/21 0504  AST 22  --   --   ALT 16  --   --   ALKPHOS 56  --   --   BILITOT 0.3  --   --   PROT 6.4*  --   --   ALBUMIN 3.5 2.5* 2.1*   No results for input(s): LIPASE, AMYLASE in the last 168 hours. No results for input(s): AMMONIA in the last 168 hours.  ABG No results found for: PHART, PCO2ART, PO2ART, HCO3, TCO2, ACIDBASEDEF, O2SAT   Coagulation Profile: Recent Labs  Lab 01/30/21 1157 01/31/21 0950  INR 1.6* 1.5*    Cardiac Enzymes: No results for input(s): CKTOTAL, CKMB, CKMBINDEX, TROPONINI in the last 168 hours.  HbA1C: Hgb A1c MFr Bld  Date/Time Value Ref Range Status  12/02/2020 07:11 AM 8.6 (H) 4.8 - 5.6 % Final    Comment:     (NOTE) Pre diabetes:          5.7%-6.4%  Diabetes:              >6.4%  Glycemic control for   <7.0% adults with diabetes   08/17/2019 03:11 PM 10.0 (H) 4.8 - 5.6 % Final    Comment:             Prediabetes: 5.7 - 6.4          Diabetes: >6.4          Glycemic control for adults with diabetes: <7.0     CBG: Recent Labs  Lab 01/31/21 2101 01/31/21 2346 02/01/21 0257 02/01/21 0743 02/01/21 1118  GLUCAP 303* 244* 193* 156* 148*    CRITICAL CARE Performed by: Cristal Generous   Total critical care time: 51 minutes  Critical care time was exclusive of separately billable procedures and treating other patients. Critical care was necessary to treat or prevent imminent or life-threatening deterioration.  Critical care was time spent personally by me on the following activities: development of treatment plan with patient and/or surrogate as well as nursing, discussions with consultants, evaluation of patient's response to treatment, examination of patient, obtaining history from patient or surrogate, ordering and performing treatments and interventions, ordering and review of laboratory studies, ordering and review of radiographic studies, pulse oximetry and re-evaluation of patient's condition.  Eliseo Gum MSN, AGACNP-BC [place] for pager  02/01/2021, 11:46 AM

## 2021-02-01 NOTE — Progress Notes (Signed)
[facility] for Infectious Disease  Date of Admission:  01/26/2021           Reason for visit: Follow up on bacteremia  Current antibiotics: Ceftriaxone 6/1--present  ASSESSMENT:    1. Klebsiella pneumonia bacteremia: Secondary to urinary source presenting with severe sepsis/shock and ongoing fevers. 2. AKI on CKD: Nephrology following. 3. Cervical cord compression from spinal stenosis: Neurosurgery following but currently not a surgical candidate.  PLAN:    . Continue ceftriaxone 2 g daily . Follow-up repeat blood cultures . Will follow   Principal Problem:   Cord compression [facility]) Active Problems:   Acute kidney injury superimposed on CKD ([place])   Type 2 diabetes mellitus with diabetic neuropathy, unspecified (HCC)   Acute lower UTI   Hyponatremia   Septic shock due to Klebsiella pneumoniae (H[facility])   Metabolic acidosis with normal anion gap and failure of bicarbonate regeneration   Urinary retention    MEDICATIONS:    Scheduled Meds: . atorvastatin  20 mg Oral QPM  . Chlorhexidine Gluconate Cloth  6 each Topical Daily  . insulin aspart  0-15 Units Subcutaneous Q4H  . pantoprazole  40 mg Oral Daily  . polyethylene glycol  17 g Oral Daily  . senna  2 tablet Oral Daily   Continuous Infusions: . sodium chloride Stopped (01/31/21 1244)  . sodium chloride 10 mL/hr at 02/01/21 1033  . amiodarone 30 mg/hr (02/01/21 1000)  . cefTRIAXone (ROCEPHIN)  IV 2 g (02/01/21 1034)  . norepinephrine (LEVOPHED) Adult infusion Stopped (01/31/21 2338)  .  sodium bicarbonate (isotonic) infusion in sterile water 100 mL/hr at 02/01/21 1000   PRN Meds:.Place/Maintain arterial line **AND** sodium chloride, acetaminophen **OR** acetaminophen, HYDROcodone-acetaminophen, LORazepam, ondansetron **OR** ondansetron (ZOFRAN) IV, polyethylene glycol  SUBJECTIVE:   24 hour events:  Remains febrile, T-max 102.9 Renal ultrasound with no acute abnormalities other than small amount of  right perinephric fluid 6/1 blood cultures with pansensitive Klebsiella pneumonia Urine cultures with the same Repeat blood cultures drawn yesterday pending WBC improved Creatinine improved, GFR relatively stagnant Transferred to ICU yesterday for continued hypotension.  No current surgical plans given her other medical issues at the moment.    Review of Systems  Unable to perform ROS: Acuity of condition      OBJECTIVE:   Blood pressure (!) 109/56, pulse 87, temperature (!) 102 F (38.9 C), resp. rate 19, height '5\' 2"'$  (1.575 m), weight 74.8 kg, SpO2 97 %. Body mass index is 30.18 kg/m.  Physical Exam Constitutional:      Comments: Resting in bed, briefly opens eyes to her name.  HENT:     Head: Normocephalic and atraumatic.  Eyes:     General: No scleral icterus. Pulmonary:     Effort: Pulmonary effort is normal. No respiratory distress.  Abdominal:     General: There is no distension.     Palpations: Abdomen is soft.     Tenderness: There is no abdominal tenderness.  Genitourinary:    Comments: Foley in place Skin:    General: Skin is warm and dry.     Findings: No rash.      Lab Results: Lab Results  Component Value Date   WBC 10.7 (H) 02/01/2021   HGB 7.8 (L) 02/01/2021   HCT 22.3 (L) 02/01/2021   MCV 87.1 02/01/2021   PLT 132 (L) 02/01/2021    Lab Results  Component Value Date   NA 125 (L) 02/01/2021   K 4.1 02/01/2021  CO2 22 02/01/2021   GLUCOSE 166 (H) 02/01/2021   BUN 54 (H) 02/01/2021   CREATININE 2.28 (H) 02/01/2021   CALCIUM 7.3 (L) 02/01/2021   GFRNONAA 21 (L) 02/01/2021   GFRAA 48 (L) 08/17/2019    Lab Results  Component Value Date   ALT 16 01/25/2021   AST 22 01/25/2021   ALKPHOS 56 01/25/2021   BILITOT 0.3 01/25/2021       Component Value Date/Time   CRP 30.3 (H) 02/01/2021 0504       Component Value Date/Time   ESRSEDRATE 137 (H) 02/01/2021 0504     I have reviewed the micro and lab results in Epic.  Imaging: DG  Chest Port 1 View  Result Date: 01/31/2021 CLINICAL DATA:  85 year old female with abnormal respiration. EXAM: PORTABLE CHEST 1 VIEW COMPARISON:  Portable chest 01/25/2021. FINDINGS: Portable AP view at 0337 hours. Rotated to the right now. Lung volumes and mediastinal contours are stable. Visualized tracheal air column is within normal limits. Allowing for portable technique the lungs are clear. No pneumothorax or pleural effusion. ACDF hardware visible. IMPRESSION: No acute cardiopulmonary abnormality when allowing for rotated patient positioning. Electronically Signed   By: Genevie Ann M.D.   On: 01/31/2021 07:22     Imaging independently reviewed in Epic.    Raynelle Highland for Infectious Disease [facility] [phone] pager 02/01/2021, 11:11 AM  I spent greater than 35 minutes with the patient including greater than 50% of time in face to face counsel of the patient and in coordination of their care.

## 2021-02-02 ENCOUNTER — Encounter (HOSPITAL_COMMUNITY): Payer: Self-pay | Admitting: Internal Medicine

## 2021-02-02 DIAGNOSIS — A419 Sepsis, unspecified organism: Secondary | ICD-10-CM | POA: Diagnosis not present

## 2021-02-02 DIAGNOSIS — E871 Hypo-osmolality and hyponatremia: Secondary | ICD-10-CM | POA: Diagnosis not present

## 2021-02-02 DIAGNOSIS — N179 Acute kidney failure, unspecified: Secondary | ICD-10-CM | POA: Diagnosis not present

## 2021-02-02 DIAGNOSIS — Z515 Encounter for palliative care: Secondary | ICD-10-CM

## 2021-02-02 DIAGNOSIS — G9520 Unspecified cord compression: Secondary | ICD-10-CM | POA: Diagnosis not present

## 2021-02-02 DIAGNOSIS — G9340 Encephalopathy, unspecified: Secondary | ICD-10-CM

## 2021-02-02 DIAGNOSIS — Z7189 Other specified counseling: Secondary | ICD-10-CM

## 2021-02-02 LAB — RENAL FUNCTION PANEL
Albumin: 1.9 g/dL — ABNORMAL LOW (ref 3.5–5.0)
Anion gap: 11 (ref 5–15)
BUN: 50 mg/dL — ABNORMAL HIGH (ref 8–23)
CO2: 26 mmol/L (ref 22–32)
Calcium: 7.1 mg/dL — ABNORMAL LOW (ref 8.9–10.3)
Chloride: 88 mmol/L — ABNORMAL LOW (ref 98–111)
Creatinine, Ser: 1.81 mg/dL — ABNORMAL HIGH (ref 0.44–1.00)
GFR, Estimated: 28 mL/min — ABNORMAL LOW
Glucose, Bld: 162 mg/dL — ABNORMAL HIGH (ref 70–99)
Phosphorus: 2.2 mg/dL — ABNORMAL LOW (ref 2.5–4.6)
Potassium: 3.5 mmol/L (ref 3.5–5.1)
Sodium: 125 mmol/L — ABNORMAL LOW (ref 135–145)

## 2021-02-02 LAB — GLUCOSE, CAPILLARY
Glucose-Capillary: 117 mg/dL — ABNORMAL HIGH (ref 70–99)
Glucose-Capillary: 136 mg/dL — ABNORMAL HIGH (ref 70–99)
Glucose-Capillary: 143 mg/dL — ABNORMAL HIGH (ref 70–99)
Glucose-Capillary: 148 mg/dL — ABNORMAL HIGH (ref 70–99)
Glucose-Capillary: 178 mg/dL — ABNORMAL HIGH (ref 70–99)
Glucose-Capillary: 191 mg/dL — ABNORMAL HIGH (ref 70–99)

## 2021-02-02 LAB — CBC
HCT: 22.1 % — ABNORMAL LOW (ref 36.0–46.0)
Hemoglobin: 7.7 g/dL — ABNORMAL LOW (ref 12.0–15.0)
MCH: 29.8 pg (ref 26.0–34.0)
MCHC: 34.8 g/dL (ref 30.0–36.0)
MCV: 85.7 fL (ref 80.0–100.0)
Platelets: 145 10*3/uL — ABNORMAL LOW (ref 150–400)
RBC: 2.58 MIL/uL — ABNORMAL LOW (ref 3.87–5.11)
RDW: 13.8 % (ref 11.5–15.5)
WBC: 9.6 10*3/uL (ref 4.0–10.5)
nRBC: 0 % (ref 0.0–0.2)

## 2021-02-02 LAB — MAGNESIUM: Magnesium: 2 mg/dL (ref 1.7–2.4)

## 2021-02-02 MED ORDER — INSULIN ASPART 100 UNIT/ML IJ SOLN
0.0000 [IU] | INTRAMUSCULAR | Status: DC
  Administered 2021-02-02 (×2): 3 [IU] via SUBCUTANEOUS
  Administered 2021-02-04: 4 [IU] via SUBCUTANEOUS
  Administered 2021-02-04 (×2): 7 [IU] via SUBCUTANEOUS
  Administered 2021-02-04 – 2021-02-05 (×3): 3 [IU] via SUBCUTANEOUS
  Administered 2021-02-05: 4 [IU] via SUBCUTANEOUS
  Administered 2021-02-06 (×2): 3 [IU] via SUBCUTANEOUS
  Administered 2021-02-06: 15 [IU] via SUBCUTANEOUS
  Administered 2021-02-06 (×2): 7 [IU] via SUBCUTANEOUS
  Administered 2021-02-06: 11 [IU] via SUBCUTANEOUS
  Administered 2021-02-07: 3 [IU] via SUBCUTANEOUS
  Administered 2021-02-07: 4 [IU] via SUBCUTANEOUS
  Administered 2021-02-07: 3 [IU] via SUBCUTANEOUS
  Administered 2021-02-07: 4 [IU] via SUBCUTANEOUS
  Administered 2021-02-07: 3 [IU] via SUBCUTANEOUS
  Administered 2021-02-07: 4 [IU] via SUBCUTANEOUS
  Administered 2021-02-08: 3 [IU] via SUBCUTANEOUS
  Administered 2021-02-08: 4 [IU] via SUBCUTANEOUS
  Administered 2021-02-08: 7 [IU] via SUBCUTANEOUS
  Administered 2021-02-08 (×3): 4 [IU] via SUBCUTANEOUS
  Administered 2021-02-08 – 2021-02-09 (×3): 3 [IU] via SUBCUTANEOUS
  Administered 2021-02-09: 4 [IU] via SUBCUTANEOUS
  Administered 2021-02-09: 3 [IU] via SUBCUTANEOUS
  Administered 2021-02-10: 4 [IU] via SUBCUTANEOUS
  Administered 2021-02-10: 3 [IU] via SUBCUTANEOUS
  Administered 2021-02-10: 4 [IU] via SUBCUTANEOUS
  Administered 2021-02-10 – 2021-02-12 (×10): 3 [IU] via SUBCUTANEOUS
  Administered 2021-02-12: 4 [IU] via SUBCUTANEOUS

## 2021-02-02 MED ORDER — POTASSIUM CHLORIDE CRYS ER 20 MEQ PO TBCR
20.0000 meq | EXTENDED_RELEASE_TABLET | Freq: Once | ORAL | Status: AC
  Administered 2021-02-02: 20 meq via ORAL
  Filled 2021-02-02: qty 1

## 2021-02-02 MED ORDER — LACTATED RINGERS IV SOLN: INTRAVENOUS | Status: DC

## 2021-02-02 MED ORDER — AMIODARONE HCL 200 MG PO TABS
200.0000 mg | ORAL_TABLET | Freq: Every day | ORAL | Status: DC
  Administered 2021-02-02 – 2021-02-12 (×10): 200 mg via ORAL
  Filled 2021-02-02 (×10): qty 1

## 2021-02-02 MED ORDER — POTASSIUM PHOSPHATES 15 MMOLE/5ML IV SOLN
15.0000 mmol | Freq: Once | INTRAVENOUS | Status: AC
  Administered 2021-02-02: 15 mmol via INTRAVENOUS
  Filled 2021-02-02: qty 5

## 2021-02-02 NOTE — Progress Notes (Signed)
Patient ID: [name], female   DOB: 06/20/1936, 85 y.o.   MRN: [id number] [facility] Progress Note   Assessment/ Plan:   1.  Acute kidney injury on chronic kidney disease stage III: This appears to be possibly from 2 insults; likely initiated by obstruction and now exacerbated by hypotension with likely ischemic ATN.  She is nonoliguric and continues to have slow but gradual renal recovery without any acute indications for dialysis or acute electrolyte abnormality prompting intervention.  Renal service will sign off at this time and remain available for questions or concerns.  2.  Hyponatremia: Likely secondary to impaired free water handling in the setting of acute kidney injury.  Monitor with isotonic fluids and limit oral intake to <1.2 L a day; anticipate this to improve with improving renal function and would benefit from diuretics when he gets more hemodynamically stable. 3.  Anion gap metabolic acidosis: Secondary to acute kidney injury, corrected with isotonic sodium bicarbonate. 4.  Cervical cord compression from spinal stenosis: Neurosurgery have seen her and anticipate overall poor prognosis projected even with surgical intervention based on density of quadriparesis.  Not appropriate for surgery at this time with fever/AKI. 5.  Urinary tract infection with sepsis-Klebsiella pneumonia: Remains on ceftriaxone and had fever yesterday morning-downtrending fever trend since then.  Subjective:   No acute events overnight   Objective:   BP (!) 111/54   Pulse 81   Temp 98.2 F (36.8 C) (Oral)   Resp 18   Ht '5\' 2"'$  (1.575 m)   Wt 74.8 kg   SpO2 99%   BMI 30.18 kg/m   Intake/Output Summary (Last 24 hours) at 02/02/2021 0840 Last data filed at 02/02/2021 0700 Gross per 24 hour  Intake 3279.72 ml  Output 1600 ml  Net 1679.72 ml   Weight change:   Physical Exam: Gen: Comfortably resting in bed, awakens with deep stimulation CVS: Pulse regular rhythm, normal rate, S1  and S2 with ejection systolic murmur Resp: Clear to auscultation bilaterally without rales/rhonchi Abd: Soft, flat, nontender Ext: Trace-1+ upper and lower extremity edema  Imaging: No results found.  Labs: BMET Recent Labs  Lab 01/30/21 0410 01/30/21 1302 01/31/21 0905 01/31/21 0906 01/31/21 2358 02/01/21 0504 02/02/21 0334  NA 124* 128* 128* 125* 122* 125* 125*  K 4.7 4.7 5.1 4.8 4.2 4.1 3.5  CL 93* 101 102 98 92* 93* 88*  CO2 16* 15* 13* 13* 17* 22 26  GLUCOSE 142* 110* 155* 162* 242* 166* 162*  BUN 72* 72* 69* 67* 58* 54* 50*  CREATININE 2.75* 2.85* 2.67* 2.77* 2.46* 2.28* 1.81*  CALCIUM 7.7* 7.4* 7.6* 7.5* 7.1* 7.3* 7.1*  PHOS  --   --  4.5 4.4  --  3.1 2.2*   CBC Recent Labs  Lab 01/29/21 1505 01/30/21 0410 01/31/21 0905 02/01/21 0504 02/02/21 0334  WBC 10.9* 11.6* 13.1* 10.7* 9.6  NEUTROABS 9.4*  --  11.3*  --   --   HGB 9.8* 8.8* 8.3* 7.8* 7.7*  HCT 29.0* 25.3* 23.6* 22.3* 22.1*  MCV 91.2 87.5 88.1 87.1 85.7  PLT 175 138* 153 132* 145*   Medications:    . atorvastatin  20 mg Oral QPM  . Chlorhexidine Gluconate Cloth  6 each Topical Daily  . insulin aspart  0-15 Units Subcutaneous Q4H  . pantoprazole  40 mg Oral Daily  . polyethylene glycol  17 g Oral Daily  . senna  2 tablet Oral Daily   Elmarie Shiley, MD 02/02/2021, 8:40  AM

## 2021-02-02 NOTE — Consult Note (Signed)
Consultation Note Date: 02/02/2021   Patient Name: [name]  DOB: 05/10/1936  MRN: [id number]  Age / Sex: 85 y.o., female  PCP: Pleas Koch Virgina Evener, MD Referring Physician: Juanito Doom, MD  Reason for Consultation: Establishing goals of care and Psychosocial/spiritual support  HPI/Patient Profile: 85 y.o. female  with past medical history of HTN/HLD, DM, CKD3, neuropathy, obesity, history of back surgery, cervical disc surgery, appendectomy, abdominal hysterectomy, hand and knee surgery admitted on 01/26/2021 with CT 5/29 showing disc space narrowingC3-4 6/1 severe canal stenosis with cord compression, bacteremia dt klebsiella pne UTI, AKI.   Clinical Assessment and Goals of Care: I have reviewed medical records including EPIC notes, labs and imaging, received report from RN, assessed the patient and then met at the bedside along with daughters Apolinar Junes and Kyrsten Deleeuw, grand daughter and daughter [name] and son Cletus via phone, to discuss diagnosis prognosis, GOC, EOL wishes, disposition and options.  I introduced Palliative Medicine as specialized medical care for people living with serious illness. It focuses on providing relief from the symptoms and stress of a serious illness. The goal is to improve quality of life for both the patient and the family.  We discussed a brief life review of the patient, [name] husband passed in 1995, she worked at [employer].  She has been living independently, independent with ADLs, but two daughters bring groceries.  She is able to do light housekeeping and prepare light meals.  [name] has enjoyed her independence. Daughter Peter Congo shares that a few weeks ago [name] shared that she believes that she does not have long to live.  We talk about foresight.   We talk at length about her acute illness including, but not limited to  AKI-improving,  bacteremia and the treatment plan, spinal stenosis with cord compression and neurosurgery recommendations.  [name] is alert and oriented and able to make her needs/wishes known.  Family is working together as a Therapist, occupational to assist with decision making.  Family is understanding that [name] is NOT a candidate for neurosurgery at this time.  We talk about continueing to treat, time for outcomes.  I share that [name] body will let us know what she is able to tolerate.  She tells [place] that she is in a tough spot and wishes that she could "just go".    We talk about some "what if's and maybes" related to life if Mrs. Thrush is WC bound.  Earlier she told me that she would not want to live if she were WC bound.  Family asks her now and she states she doesn't know.  Unfourtunatley, one daughter asks "you dont want to leave [place], do you?" and I share that of course she doesn't, but things are looking different now, redirecting family to focus on what matters to [name].  We talk about how to make choices for loved ones including 1) keeping them at the center of decision making, 2) are we doing something for her or to  her.      Daughter, Anette Barra, shares that she and Peter Congo are working for [employer] to assist with care and for [name] to discharge to daughters home. We talk about the natural declines that happen with decreased mobility including, but not limited to, skin breakdown, UTI, PNE.   Advanced directives, concepts specific to code status, were considered and discussed.  We tlak about "treat the treatable, but no CPR or intubation".  Patient and   Discussed the importance of continued conversation with family and the medical providers regarding overall plan of care and treatment options, ensuring decisions are within the context of the patient's values and GOCs.    Questions and concerns were addressed.  The family was encouraged to call with questions or concerns.  PMT will continue  to support holistically.  PMT to have family meeting 6/6 around 9:30 am.    HCPOA    NEXT OF KIN - [name] has 4 children, Patria, Warzecha and Cletus. They make choices as a team.     SUMMARY OF RECOMMENDATIONS   Continue to treat the treatable, but no CPR or intubation Times for outcomes.  Family would be agreeable to surgical intervention if relieves suffering Working for assistance from [employer] Would move to daughter Anesa Fronek home   Code Status/Advance Care Planning: DNR  - Treat the treatable, but no CPR or intubation   Symptom Management:   Per hospitalist, no additional needs at this time.   Palliative Prophylaxis:   Bowel Regimen, Oral Care and Palliative Wound Care  Additional Recommendations (Limitations, Scope, Preferences):  Treat the treatable, open to surgical intervention if offered  Psycho-social/Spiritual:   Desire for further Chaplaincy support:yes  Additional Recommendations: Caregiving  Support/Resources  Prognosis:   Unable to determine, based on outcomes. Guarded at this point, 6 months or less would not be surprising based on decreased functional status.   Discharge Planning: To be determined, based on outcomes.       Primary Diagnoses: Present on Admission: . Cord compression ([place]) . Acute kidney injury superimposed on CKD ([place]) . Acute lower UTI . Hyponatremia . Type 2 diabetes mellitus with diabetic neuropathy, unspecified ([place])   I have reviewed the medical record, interviewed the patient and family, and examined the patient. The following aspects are pertinent.  Past Medical History:  Diagnosis Date  . Diabetes mellitus without complication ([place])   . High cholesterol   . Neck pain   . Neuropathy    Social History   Socioeconomic History  . Marital status: Widowed    Spouse name: Not on file  . Number of children: Not on file  . Years of education: Not on file  . Highest education level: Not on file   Occupational History  . Not on file  Tobacco Use  . Smoking status: Never Smoker  . Smokeless tobacco: Never Used  Vaping Use  . Vaping Use: Never used  Substance and Sexual Activity  . Alcohol use: Never  . Drug use: Never  . Sexual activity: Never  Other Topics Concern  . Not on file  Social History Narrative  . Not on file   Social Determinants of Health   Financial Resource Strain: Not on file  Food Insecurity: Not on file  Transportation Needs: Not on file  Physical Activity: Not on file  Stress: Not on file  Social Connections: Not on file   Family History  Problem Relation Age of Onset  . Cardiomyopathy  Father   . Cancer Mother    Scheduled Meds: . amiodarone  200 mg Oral Daily  . atorvastatin  20 mg Oral QPM  . Chlorhexidine Gluconate Cloth  6 each Topical Daily  . insulin aspart  0-20 Units Subcutaneous Q4H  . pantoprazole  40 mg Oral Daily  . polyethylene glycol  17 g Oral Daily  . senna  2 tablet Oral Daily   Continuous Infusions: . sodium chloride 250 mL (02/01/21 1033)  . sodium chloride 0 mL (02/01/21 1245)  . cefTRIAXone (ROCEPHIN)  IV Stopped (02/02/21 1126)  . lactated ringers 75 mL/hr at 02/02/21 1216  . potassium PHOSPHATE IVPB (in mmol) 43 mL/hr at 02/02/21 1200   PRN Meds:.Place/Maintain arterial line **AND** sodium chloride, acetaminophen **OR** acetaminophen, HYDROcodone-acetaminophen, methocarbamol, ondansetron **OR** ondansetron (ZOFRAN) IV, polyethylene glycol Medications Prior to Admission:  Prior to Admission medications   Medication Sig Start Date End Date Taking? Authorizing Provider  acetaminophen (TYLENOL) 325 MG tablet Take 2 tablets (650 mg total) by mouth every 6 (six) hours. 12/04/20  Yes Emokpae, Courage, MD  albuterol (VENTOLIN HFA) 108 (90 Base) MCG/ACT inhaler Inhale 1 puff into the lungs every 4 (four) hours as needed for wheezing or shortness of breath.   Yes [provider]  atorvastatin (LIPITOR) 20 MG tablet  Take 1 tablet (20 mg total) by mouth every evening. 12/20/20  Yes Gerlene Fee, NP  chlorthalidone (HYGROTON) 25 MG tablet Take 25 mg by mouth daily. 01/01/21  Yes [provider]  gabapentin (NEURONTIN) 300 MG capsule Take 1 capsule (300 mg total) by mouth 2 (two) times daily. 12/20/20  Yes Gerlene Fee, NP  LANTUS SOLOSTAR 100 UNIT/ML Solostar Pen Inject 30 Units into the skin daily. 12/20/20  Yes Gerlene Fee, NP  metFORMIN (GLUCOPHAGE) 500 MG tablet Take 1 tablet (500 mg total) by mouth 2 (two) times daily with a meal. 12/20/20  Yes Gerlene Fee, NP  mirtazapine (REMERON) 15 MG tablet Take 15 mg by mouth at bedtime. 01/01/21  Yes [provider]  pantoprazole (PROTONIX) 40 MG tablet Take 40 mg by mouth daily.   Yes [provider]  bisacodyl (DULCOLAX) 10 MG suppository Place 1 suppository (10 mg total) rectally daily as needed for moderate constipation. Patient not taking: No sig reported 12/04/20   Roxan Hockey, MD  HYDROcodone-acetaminophen (NORCO/VICODIN) 5-325 MG tablet Take 1 tablet by mouth every 6 (six) hours as needed for severe pain. Patient not taking: No sig reported 01/26/21   Joy, Shawn C, PA-C  lisinopril (ZESTRIL) 10 MG tablet Take 1 tablet (10 mg total) by mouth daily. Patient not taking: No sig reported 12/20/20   Gerlene Fee, NP  methocarbamol (ROBAXIN) 500 MG tablet Take 1 tablet (500 mg total) by mouth 3 (three) times daily. Patient not taking: Reported on 01/26/2021 12/20/20   Gerlene Fee, NP  NON FORMULARY Dist Consistent Carbohydrate    [provider]  omeprazole (PRILOSEC) 20 MG capsule Take 20 mg by mouth daily. Patient not taking: No sig reported    [provider]  senna-docusate (SENOKOT-S) 8.6-50 MG tablet Take 2 tablets by mouth at bedtime. Patient not taking: No sig reported 12/04/20 12/04/21  Roxan Hockey, MD   Allergies  Allergen Reactions  . Codeine    Review of Systems  Unable to perform  ROS: Acuity of condition    Physical Exam Vitals and nursing note reviewed.  Constitutional:      General: She is not in  acute distress.    Appearance: She is obese. She is ill-appearing.  HENT:     Mouth/Throat:     Mouth: Mucous membranes are dry.  Cardiovascular:     Rate and Rhythm: Normal rate.  Pulmonary:     Effort: Pulmonary effort is normal. No respiratory distress.  Abdominal:     Comments: Soft but distended   Skin:    General: Skin is warm and dry.  Neurological:     Mental Status: She is alert and oriented to person, place, and time.  Psychiatric:        Mood and Affect: Mood normal.        Behavior: Behavior normal.     Vital Signs: BP (!) 104/49   Pulse 78   Temp (!) 100.7 F (38.2 C) (Axillary)   Resp 18   Ht _0  (1.575 m)   Wt 74.8 kg   SpO2 98%   BMI 30.18 kg/m  Pain Scale: Faces   Pain Score: 0-No pain   SpO2: SpO2: 98 % O2 Device:SpO2: 98 % O2 Flow Rate: .O2 Flow Rate (L/min): 2 L/min  IO: Intake/output summary:   Intake/Output Summary (Last 24 hours) at 02/02/2021 1352 Last data filed at 02/02/2021 1200 Gross per 24 hour  Intake 3808.5 ml  Output 1600 ml  Net 2208.5 ml    LBM: Last BM Date: 02/02/21 Baseline Weight: Weight: 74.8 kg Most recent weight: Weight: 74.8 kg     Palliative Assessment/Data:   Flowsheet Rows   Flowsheet Row Most Recent Value  Intake Tab   Referral Department Hospitalist  Unit at Time of Referral Intermediate Care Unit  Palliative Care Primary Diagnosis Trauma  Date Notified 02/01/21  Palliative Care Type New Palliative care  Reason for referral Clarify Goals of Care  Date of Admission 01/26/21  Date first seen by Palliative Care 02/02/21  # of days Palliative referral response time 1 Day(s)  # of days IP prior to Palliative referral 6  Clinical Assessment   Palliative Performance Scale Score 10%  Pain Max last 24 hours Not able to report  Pain Min Last 24 hours Not able to report  Dyspnea Max  Last 24 Hours Not able to report  Dyspnea Min Last 24 hours Not able to report  Psychosocial & Spiritual Assessment   Palliative Care Outcomes       Time In: 1400   Time Out: 1520 Time Total: 80 minutes  Greater than 50%  of this time was spent counseling and coordinating care related to the above assessment and plan.  Signed by: Drue Novel, NP   Please contact Palliative Medicine Team phone at [phone] for questions and concerns.  For individual provider: See Shea Evans

## 2021-02-02 NOTE — Progress Notes (Addendum)
NAME:  [name], MRN:  [id number], DOB:  1936-08-28, LOS: 4 ADMISSION DATE:  01/26/2021, CONSULTATION DATE:  01/30/2021 REFERRING MD:  Dr. Algis Liming, CHIEF COMPLAINT:  Fall, weakness    History of Present Illness:  [name] is a 85 y.o. female with a PMX significant for neuropathy, neck pain, HLD, and diabetes who present to [facility] on 01/26/2021 after a fall down 2 stairs.  Imaging in ED showing no fractures. CT on 5/29 showing disc space narrowing and endplate irregularity and osteophyte at C3-C4. MRI on 6/1 showing severe canal stenosis with cord compression at C3-C4. Labs showing Acute on CKD and hyponatremia. UA suggestive of UTI. Started on Ceftriaxone. Neurosurgeon called.  Patient transferred to [facility] on 6/2. Patient arrived hypotensive and AMS. PCCM consulted for management of hypotension. Will admit to 4n ICU.  Pertinent  Medical History   Past Medical History:  Diagnosis Date  . Diabetes mellitus without complication ([place])   . High cholesterol   . Neck pain   . Neuropathy      Significant Hospital Events: Including procedures, antibiotic start and stop dates in addition to other pertinent events   . 5/29: Admitted to [facility] for fall . 6/2: Transferred to [facility] and will admit to 4N ICU due to hypotension. IV fluids and bolus's given. GNR in blood and GNR UTI. On rocephin  . 6/3 on minimal levophed. Awaiting NSGY plan. Worsening limb weakness  . 6/4 off NE. Started on amio overnight. BCx and Urine with klebsiella pneumoniae  . 6/5 got ativan overnight, more sleepy this morning but grossly unchanged   Interim History / Subjective:   6/5-  Na 125 Cr 1.8 K 3.5 Mag 2 WBC 9.6 hgb 7.7  No fevers since 6/4 afternoon. MAPs have drifted down overnight <70 but SBPs >90-- was not restarted on pressors  Continues on amio   Objective   Blood pressure (!) 111/54, pulse 81, temperature 99 F (37.2 C), temperature source Oral, resp. rate 18, height '5\' 2"'$  (1.575  m), weight 74.8 kg, SpO2 99 %.        Intake/Output Summary (Last 24 hours) at 02/02/2021 0731 Last data filed at 02/02/2021 0700 Gross per 24 hour  Intake 3413.11 ml  Output 1600 ml  Net 1813.11 ml   Filed Weights   01/26/21 0848  Weight: 74.8 kg    Examination:  General: Ill appearing elderly F, reclined in bed NAD  HEENT: NCAT pink dry mm anicteric sclera  Neuro: Awakens to voice, following commands. Oriented x2. BLE 1/5 Upper extremities 3/5  CV: s1s2 no rgm cap refill < 3sec  PULM: CTAB. Even unlabored. Symmetrical chest expansion  GI: Soft round ndnt + bowel sounds  GU: yellow urine, foley  Extremities: no acute joint deformity no cyanosis or clubbing  Skin: c/d/w no rash    Labs/imaging that I havepersonally reviewed  (right click and "Reselect all SmartList Selections" daily)    CT cervical spine 5/28: Multilevel degenerative changes. There is disc space narrowing and endplate irregularity and osteophyte at C3-C4. C4-C6 ACDF.  MRI cervical/lumbar spine 6/1: severe canal stenosis with cord compression at C3-C4. Abnormal cord signal is present just below this level (at operative levels) and may reflect edema or myelomalacia  6/5-  Na 125 Cr 1.8 K 3.5 Mag 2 WBC 9.6 hgb 7.7  Resolved Hospital Problem list   Hypomagnesemia  Shock NAGMA Assessment & Plan:   Acute metabolic encephalopathy  -infection, poor sleep, ?delirium, med side  effects, dehydration Acute pain / muscle spasms  -prior to admission pretty ambulatory.  Plan: -trying to minimize CNS depressing meds as able  -cont rocephin -delirium precautions   Severe C3-C4 stenosis with cord compression S/p prior ACDF  - MRI 6/1: severe canal stenosis with cord compression at C3-C4. Plan: -Felt to have a poor prognosis for functional outcome even with surgery in d/w Dr. Saintclair Halsted -not currently stable for the OR with infections -Possible op plan tbd after acute metabolic problems improve   Severe sepsis due to  klebsiella pneumoniae bacteremia and klebsiella pneumoniae UTI -shock improved  Plan: -ID following, rocephin -cont IVF -dc art line -following repeat cx   AKI on CKD III  Urinary retention in setting of cord compression  Plan: -continue foley  -trend UOP, renal indices  -will dc bicarb as acidosis has improved   Afib RVR, improved  -- has stayed NSR  Plan: -will change amio gtt to PO. Likely something that can be dc this hospitalization as it seems this is new onset and quickly resolved  -Mag goal 2 K goal 4   Hyponatremia -likely in setting of AKI  Plan: -trend metabolic panel  -IVF   Hx DM Plan: -SSI and CBG monitoring  Hx HTN Hx HLD  Plan: -Hold home meds while hypotensive -Continue statin  Goals of Care  -Difficult case. Pt is not currently an operative candidate for her cervical cord compression with dense quadriparesis due to concomitant medical problems (severe sepsis, new afib rvr). Not currently appropriate for OR. 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  Code Status:  full code Disposition: Stable for transfer to progressive, will ask TRH to resume care 6/6  Labs   CBC: Recent Labs  Lab  01/29/21 1505 01/30/21 0410 01/31/21 0905 02/01/21 0504 02/02/21 0334  WBC 10.9* 11.6* 13.1* 10.7* 9.6  NEUTROABS 9.4*  --  11.3*  --   --   HGB 9.8* 8.8* 8.3* 7.8* 7.7*  HCT 29.0* 25.3* 23.6* 22.3* 22.1*  MCV 91.2 87.5 88.1 87.1 85.7  PLT 175 138* 153 132* 145*    Basic Metabolic Panel: Recent Labs  Lab 01/31/21 0905 01/31/21 0906 01/31/21 2358 02/01/21 0504 02/02/21 0334  NA 128* 125* 122* 125* 125*  K 5.1 4.8 4.2 4.1 3.5  CL 102 98 92* 93* 88*  CO2 13* 13* 17* 22 26  GLUCOSE 155* 162* 242* 166* 162*  BUN 69* 67* 58* 54* 50*  CREATININE 2.67* 2.77* 2.46* 2.28* 1.81*  CALCIUM 7.6* 7.5* 7.1* 7.3* 7.1*  MG 1.5*  --  1.5*  --  2.0  PHOS 4.5 4.4  --  3.1 2.2*   GFR: Estimated Creatinine Clearance: 22.7 mL/min (A) (by C-G formula based on  SCr of 1.81 mg/dL (H)). Recent Labs  Lab 01/29/21 1730 01/30/21 0410 01/30/21 1157 01/30/21 1302 01/31/21 0905 02/01/21 0504 02/02/21 0334  PROCALCITON  --   --   --  101.37 102.20 75.57  --   WBC  --  11.6*  --   --  13.1* 10.7* 9.6  LATICACIDVEN 1.4  --  1.3 1.7  --   --   --     Liver Function Tests: Recent Labs  Lab 01/31/21 0906 02/01/21 0504 02/02/21 0334  ALBUMIN 2.5* 2.1* 1.9*   No results for input(s): LIPASE, AMYLASE in the last 168 hours. No results for input(s): AMMONIA in the last 168 hours.  ABG No results found for: PHART, PCO2ART, PO2ART, HCO3, TCO2, ACIDBASEDEF, O2SAT   Coagulation Profile: Recent Labs  Lab 01/30/21 1157 01/31/21 0950  INR 1.6* 1.5*    Cardiac Enzymes: No results for input(s): CKTOTAL, CKMB, CKMBINDEX, TROPONINI in the last 168 hours.  HbA1C: Hgb A1c MFr Bld  Date/Time Value Ref Range Status  12/02/2020 07:11 AM 8.6 (H) 4.8 - 5.6 % Final    Comment:    (NOTE) Pre diabetes:          5.7%-6.4%  Diabetes:              >6.4%  Glycemic control for   <7.0% adults with diabetes   08/17/2019 03:11 PM 10.0 (H) 4.8 - 5.6 % Final    Comment:             Prediabetes: 5.7 -  6.4          Diabetes: >6.4          Glycemic control for adults with diabetes: <7.0     CBG: Recent Labs  Lab 02/01/21 1118 02/01/21 1535 02/01/21 1930 02/01/21 2312 02/02/21 0319  GLUCAP 148* 139* 247* 223* 178*    Critical care time : n/a  Eliseo Gum MSN, AGACNP-BC [place] for pager  02/02/2021, 11:22 AM

## 2021-02-02 NOTE — Progress Notes (Signed)
Vitals upon arrival from 4N   02/02/21 1557  Assess: MEWS Score  Temp 99.1 F (37.3 C)  BP (!) 114/48  Pulse Rate 82  Resp 20  Level of Consciousness Alert  SpO2 98 %  O2 Device Room Air  Assess: MEWS Score  MEWS Temp 0  MEWS Systolic 0  MEWS Pulse 0  MEWS RR 0  MEWS LOC 0  MEWS Score 0  MEWS Score Color Green

## 2021-02-02 NOTE — Progress Notes (Signed)
Chaplain responded to call from nurse.  Patent desires Advanced Directives.  Chaplain explained that AD could not be completed on a weekend-as no notary is available but could speak to family and explain process.  Chaplain met with two daughters and reviewed AD and answered their questions.Marland Kitchenexplaining that it had nothing to do with monetary decisions--only decisions regarding health care wishes. Two daughters expressed their mothers wish that they be named in the event she could not speak for herself.  They may contact the spiritual care department tomorrow.  Rev. Tamsen Snider Pager [phone]

## 2021-02-02 NOTE — Progress Notes (Signed)
Patient transferred from 4N to 3W19. accompanied by family and 2 RNs. Patient settled in bed. Soft call bell in place.

## 2021-02-02 NOTE — Progress Notes (Signed)
K+ 3.5, Phos 2.2 Replaced per protocol

## 2021-02-02 NOTE — Progress Notes (Signed)
Subjective: Patient reports Patient is somnolent apparently received Ativan overnight but does arouse with deep stimulation  Objective: Vital signs in last 24 hours: Temp:  [99 F (37.2 C)-102 F (38.9 C)] 99 F (37.2 C) (06/05 0400) Pulse Rate:  [80-95] 81 (06/05 0700) Resp:  [17-28] 18 (06/05 0700) BP: (95-137)/(50-102) 111/54 (06/05 0700) SpO2:  [96 %-99 %] 99 % (06/05 0700)  Intake/Output from previous day: 06/04 0701 - 06/05 0700 In: 3413.1 [P.O.:550; I.V.:2713; IV Piggyback:150.1] Out: 1600 [Urine:1600] Intake/Output this shift: No intake/output data recorded.  Very somnolent arouses with deep stim neurologically at baseline has some weak withdrawal lower extremities  Lab Results: Recent Labs    02/01/21 0504 02/02/21 0334  WBC 10.7* 9.6  HGB 7.8* 7.7*  HCT 22.3* 22.1*  PLT 132* 145*   BMET Recent Labs    02/01/21 0504 02/02/21 0334  NA 125* 125*  K 4.1 3.5  CL 93* 88*  CO2 22 26  GLUCOSE 166* 162*  BUN 54* 50*  CREATININE 2.28* 1.81*  CALCIUM 7.3* 7.1*    Studies/Results: No results found.  Assessment/Plan: Continue aggressive medical support  LOS: 4 days     [name] 02/02/2021, 8:28 AM

## 2021-02-03 ENCOUNTER — Encounter: Payer: Medicare HMO | Admitting: Orthopedic Surgery

## 2021-02-03 DIAGNOSIS — N179 Acute kidney failure, unspecified: Secondary | ICD-10-CM | POA: Diagnosis not present

## 2021-02-03 DIAGNOSIS — N3000 Acute cystitis without hematuria: Secondary | ICD-10-CM | POA: Diagnosis not present

## 2021-02-03 DIAGNOSIS — N189 Chronic kidney disease, unspecified: Secondary | ICD-10-CM | POA: Diagnosis not present

## 2021-02-03 DIAGNOSIS — G9520 Unspecified cord compression: Secondary | ICD-10-CM | POA: Diagnosis not present

## 2021-02-03 LAB — RENAL FUNCTION PANEL
Albumin: 1.7 g/dL — ABNORMAL LOW (ref 3.5–5.0)
Anion gap: 12 (ref 5–15)
BUN: 42 mg/dL — ABNORMAL HIGH (ref 8–23)
CO2: 27 mmol/L (ref 22–32)
Calcium: 7.1 mg/dL — ABNORMAL LOW (ref 8.9–10.3)
Chloride: 87 mmol/L — ABNORMAL LOW (ref 98–111)
Creatinine, Ser: 1.52 mg/dL — ABNORMAL HIGH (ref 0.44–1.00)
GFR, Estimated: 34 mL/min — ABNORMAL LOW (ref >60)
Glucose, Bld: 96 mg/dL (ref 70–99)
Phosphorus: 3 mg/dL (ref 2.5–4.6)
Potassium: 3.3 mmol/L — ABNORMAL LOW (ref 3.5–5.1)
Sodium: 126 mmol/L — ABNORMAL LOW (ref 135–145)

## 2021-02-03 LAB — GLUCOSE, CAPILLARY
Glucose-Capillary: 96 mg/dL (ref 70–99)
Glucose-Capillary: 105 mg/dL — ABNORMAL HIGH (ref 70–99)
Glucose-Capillary: 109 mg/dL — ABNORMAL HIGH (ref 70–99)
Glucose-Capillary: 114 mg/dL — ABNORMAL HIGH (ref 70–99)
Glucose-Capillary: 114 mg/dL — ABNORMAL HIGH (ref 70–99)

## 2021-02-03 MED ORDER — DIPHENHYDRAMINE HCL 25 MG PO CAPS
25.0000 mg | ORAL_CAPSULE | Freq: Every evening | ORAL | Status: DC | PRN
  Administered 2021-02-03 – 2021-02-09 (×5): 25 mg via ORAL
  Filled 2021-02-03 (×5): qty 1

## 2021-02-03 MED ORDER — GERHARDT'S BUTT CREAM
TOPICAL_CREAM | Freq: Two times a day (BID) | CUTANEOUS | Status: DC
  Filled 2021-02-03 (×3): qty 1

## 2021-02-03 MED ORDER — IPRATROPIUM-ALBUTEROL 0.5-2.5 (3) MG/3ML IN SOLN
RESPIRATORY_TRACT | Status: AC
  Administered 2021-02-03: 3 mL via RESPIRATORY_TRACT
  Filled 2021-02-03: qty 3

## 2021-02-03 MED ORDER — ENSURE ENLIVE PO LIQD
237.0000 mL | Freq: Two times a day (BID) | ORAL | Status: DC
  Administered 2021-02-04 – 2021-02-12 (×12): 237 mL via ORAL

## 2021-02-03 MED ORDER — ADULT MULTIVITAMIN W/MINERALS CH
1.0000 | ORAL_TABLET | Freq: Every day | ORAL | Status: DC
  Administered 2021-02-03 – 2021-02-12 (×9): 1 via ORAL
  Filled 2021-02-03 (×9): qty 1

## 2021-02-03 MED ORDER — CEPHALEXIN 500 MG PO CAPS: 500.0000 mg | ORAL_CAPSULE | Freq: Three times a day (TID) | ORAL | Status: DC

## 2021-02-03 MED ORDER — IPRATROPIUM-ALBUTEROL 0.5-2.5 (3) MG/3ML IN SOLN: 3.0000 mL | Freq: Once | RESPIRATORY_TRACT | Status: AC

## 2021-02-03 MED ORDER — GERHARDT'S BUTT CREAM: TOPICAL_CREAM | CUTANEOUS | Status: DC | PRN

## 2021-02-03 MED ORDER — CEPHALEXIN 250 MG/5ML PO SUSR
500.0000 mg | Freq: Three times a day (TID) | ORAL | Status: AC
  Administered 2021-02-04 – 2021-02-10 (×19): 500 mg via ORAL
  Filled 2021-02-03 (×22): qty 10

## 2021-02-03 NOTE — Progress Notes (Signed)
Patient ID: [name], female   DOB: 14-Sep-1935, 85 y.o.   MRN: [id number] BP (!) 129/47 (BP Location: Left Arm)   Pulse 85   Temp 99.7 F (37.6 C) (Oral)   Resp 17   Ht '5\' 2"'$  (1.575 m)   Wt 74.8 kg   SpO2 95%   BMI 30.18 kg/m  Alert and oriented, actually remembered me today.  Looking at Wednesday for the ACDF Mental status is much improved Weak in all extremities

## 2021-02-03 NOTE — Progress Notes (Signed)
RT called for tx d/t wheezing.  RT arrived to pt and pt stated she felt SOB but no wheezing noted.  RT did protocol assessment with score totaling 2, that does not demonstrate reason for scheduled txs at this time.  RT ordered tx for one time for pt comfort and SOB.  Rt will continue to monitor as needed.

## 2021-02-03 NOTE — Plan of Care (Signed)
  Problem: Education: Goal: Knowledge of General Education information will improve Description Including pain rating scale, medication(s)/side effects and non-pharmacologic comfort measures Outcome: Progressing   Problem: Clinical Measurements: Goal: Ability to maintain clinical measurements within normal limits will improve Outcome: Progressing Goal: Will remain free from infection Outcome: Progressing Goal: Diagnostic test results will improve Outcome: Progressing Goal: Respiratory complications will improve Outcome: Progressing Goal: Cardiovascular complication will be avoided Outcome: Progressing   Problem: Pain Managment: Goal: General experience of comfort will improve Outcome: Progressing   

## 2021-02-03 NOTE — Progress Notes (Signed)
[facility] for Infectious Disease  Date of Admission:  01/26/2021           Reason for visit: Follow up on bacteremia  Current antibiotics: Ceftriaxone 6/1--present  ASSESSMENT:    1. Klebsiella pneumonia bacteremia: Secondary to a urinary source presenting with severe sepsis/shock and fevers.  She has had resolution of her fevers with last recorded low-grade temperature of 100.7 F yesterday.  Leukocytosis has improved.  She is hemodynamically stable. Blood cultures were repeated and are negative 2. AKI on CKD: Creatinine continues to improve 3. Cervical cord compression from spinal stenosis: Neurosurgery following  PLAN:    . Continue ceftriaxone 2 g daily through today . Starting tomorrow will transition to oral Keflex to complete therapy.  She complains of some discomfort with pill swallowing so we will provide liquid solution . We will give her 7 days of therapy with end date of 02/06/2021 . If she remains afebrile with improved infectious parameters I see no reason why she cannot proceed with surgery if that is decided upon   Principal Problem:   Cord compression [facility]) Active Problems:   Acute kidney injury superimposed on CKD ([place])   Type 2 diabetes mellitus with diabetic neuropathy, unspecified ([place])   Acute lower UTI   Hyponatremia   Septic shock due to Klebsiella pneumoniae ([place])   Metabolic acidosis with normal anion gap and failure of bicarbonate regeneration   Urinary retention    MEDICATIONS:    Scheduled Meds: . amiodarone  200 mg Oral Daily  . atorvastatin  20 mg Oral QPM  . [START ON 02/04/2021] cephALEXin  500 mg Oral Q8H  . Chlorhexidine Gluconate Cloth  6 each Topical Daily  . Gerhardt's butt cream   Topical BID  . insulin aspart  0-20 Units Subcutaneous Q4H  . pantoprazole  40 mg Oral Daily  . polyethylene glycol  17 g Oral Daily  . senna  2 tablet Oral Daily   Continuous Infusions: . sodium chloride 250 mL (02/01/21 1033)  . sodium  chloride 0 mL (02/01/21 1245)  . lactated ringers 75 mL/hr at 02/03/21 0855   PRN Meds:.Place/Maintain arterial line **AND** sodium chloride, acetaminophen **OR** acetaminophen, diphenhydrAMINE, Gerhardt's butt cream, HYDROcodone-acetaminophen, methocarbamol, ondansetron **OR** ondansetron (ZOFRAN) IV, polyethylene glycol  SUBJECTIVE:   24 hour events:  No acute events  Seen today with daughter and palliative care at bedside.  Appears improved and more alert today.  Reports discomfort with swallowing pills.  Wants her PIV removed.  No fevers, chills.  Review of Systems  All other systems reviewed and are negative.     OBJECTIVE:   Blood pressure 113/61, pulse 81, temperature 99.5 F (37.5 C), temperature source Oral, resp. rate 16, height '5\' 2"'$  (1.575 m), weight 74.8 kg, SpO2 96 %. Body mass index is 30.18 kg/m.  Physical Exam Constitutional:      General: She is not in acute distress.    Appearance: Normal appearance.  HENT:     Head: Normocephalic and atraumatic.  Eyes:     Extraocular Movements: Extraocular movements intact.     Conjunctiva/sclera: Conjunctivae normal.  Pulmonary:     Effort: Pulmonary effort is normal. No respiratory distress.  Musculoskeletal:     Cervical back: Normal range of motion and neck supple.  Neurological:     General: No focal deficit present.     Mental Status: She is alert. Mental status is at baseline.  Psychiatric:        Mood  and Affect: Mood normal.        Behavior: Behavior normal.      Lab Results: Lab Results  Component Value Date   WBC 9.6 02/02/2021   HGB 7.7 (L) 02/02/2021   HCT 22.1 (L) 02/02/2021   MCV 85.7 02/02/2021   PLT 145 (L) 02/02/2021    Lab Results  Component Value Date   NA 126 (L) 02/03/2021   K 3.3 (L) 02/03/2021   CO2 27 02/03/2021   GLUCOSE 96 02/03/2021   BUN 42 (H) 02/03/2021   CREATININE 1.52 (H) 02/03/2021   CALCIUM 7.1 (L) 02/03/2021   GFRNONAA 34 (L) 02/03/2021   GFRAA 48 (L)  08/17/2019    Lab Results  Component Value Date   ALT 16 01/25/2021   AST 22 01/25/2021   ALKPHOS 56 01/25/2021   BILITOT 0.3 01/25/2021       Component Value Date/Time   CRP 30.3 (H) 02/01/2021 0504       Component Value Date/Time   ESRSEDRATE 137 (H) 02/01/2021 0504     I have reviewed the micro and lab results in Epic.  Imaging: No results found.   Imaging independently reviewed in Epic.    Raynelle Highland for Infectious Disease [facility] [phone] pager 02/03/2021, 10:40 AM

## 2021-02-03 NOTE — Plan of Care (Signed)
Pt A&OX4, q2hrturns, pt able to vocalize needs BP 113/61 (BP Location: Left Arm)   Pulse 81   Temp 99.5 F (37.5 C) (Oral)   Resp 16   Ht '5\' 2"'$  (1.575 m)   Wt 74.8 kg   SpO2 96%   BMI 30.18 kg/m  No s/s ofdiscomfort or pain mentioned. Pt compliant with meds and treatment.  Call bell near by, bed in lowest position and bed alarm on. Hal Neer.

## 2021-02-03 NOTE — Progress Notes (Signed)
Palliative: [name] is lying quietly in bed.  She greets me, making but not keeping eye contact.  She appears acutely ill and somewhat frail.  She is alert and oriented, able to make her basic needs known.  Her daughter, [name] and great grandson, [name], are present at bedside.  We talked at length about [name] acute health concerns.  As we are talking, infectious disease physician arrives.  He shares that [name] blood cultures drawn on Friday are showing no growth.  He talks about transitioning her from IV antibiotics to by mouth antibiotics.  Daughter shares concern over ability to swallow pills, therefore medications are written as liquid.  I share that now that blood cultures are showing no growth, Dr. Christella Fernandez can go ahead with surgery if they so choose.  We talked about my conversations with Dr. Christella Fernandez this morning.  [name] states that Dr. Christella Fernandez performed surgery for her in the past and she trusts him.  Initially she is torn, but shares that she understands that surgery would be her best hope for improvement.  [name] and family are agreeable to surgery.  At this point the goal is to go to [facility] [employer] home with the help of the [facility].  [name] would likely qualify for short-term rehab, but she is in her co-pay days due to a recent rehab stent after knee damage.  Family shares concerns about their ability to pay for co-pay days.  Conference with attending, neurosurgery, bedside nursing staff, transition of care team related to patient condition, needs, goals of care. PMT to continue to follow.   Plan: Continue to treat the treatable but no CPR or intubation.  At this point agreeable to surgical intervention with Dr. Christella Fernandez  40 minutes  Gwendolyn Axe, NP Palliative medicine team Team phone [phone] Greater than 50% of this time was spent counseling and coordinating care related to the above assessment and plan.

## 2021-02-03 NOTE — Progress Notes (Signed)
Initial Nutrition Assessment  DOCUMENTATION CODES:   Not applicable  INTERVENTION:  -Ensure Enlive po BID, each supplement provides 350 kcal and 20 grams of protein -Magic cup BID with meals, each supplement provides 290 kcal and 9 grams of protein -MVI with minerals daily  NUTRITION DIAGNOSIS:   Inadequate oral intake related to poor appetite as evidenced by meal completion < 50%.  GOAL:   Patient will meet greater than or equal to 90% of their needs  MONITOR:   PO intake,Supplement acceptance,Skin,Weight trends,Labs,I & O's  REASON FOR ASSESSMENT:   Consult Assessment of nutrition requirement/status,Poor PO  ASSESSMENT:   Pt with a PMH including type 2 DM, HTN, CKD stage 3b, and neuropathy initially presented 5/28 s/p fall and left AMA. PT later returned to [facility] ED and was found to have acute urinary retention and C3-C4 cord compression on 6/1. On 6/2, pt had shock requiring intubation and tx to ICU. Pt has since stabilized enough to tx out of the ICU. Neurosurgery following.  Palliative Medicine met with family/pt today. Current plan of care is for pt to undergo surgical intervention with Dr. Christella Noa, but does not want CPR or intubation -- wants to continue to "treat the treatable."  Pt with variable intake. 0-100% meal completion x 4 recorded meals (47.5% average meal intake)  Reviewed weight history. No weight changes noted; however, pt with moderate pitting edema to BUE and BLE which may be masking weight loss.   UOP: 167m x24 hours  Medications: SSI, protonix, miralax, senokot Labs: Na 126 (L), K+ 3.3 (L) CBGs [id number] Diet Order:   Diet Order            Diet heart healthy/carb modified Room service appropriate? Yes; Fluid consistency: Thin; Fluid restriction: Other (see comments)  Diet effective now                 EDUCATION NEEDS:   No education needs have been identified at this time  Skin:  Skin Assessment: Skin Integrity Issues: Skin  Integrity Issues:: Other (Comment) Other: fissure related to moisture to inner gluteal cleft,  Last BM:  6/6 type 6  Height:   Ht Readings from Last 1 Encounters:  01/26/21 5' 2"  (1.575 m)    Weight:   Wt Readings from Last 1 Encounters:  01/26/21 74.8 kg   BMI:  Body mass index is 30.18 kg/m.  Estimated Nutritional Needs:   Kcal:  1650-1850  Protein:  80-95g  Fluid:  1L    ALarkin Ina MS, RD, LDN RD pager number and weekend/on-call pager number located in [email or web address]

## 2021-02-03 NOTE — Consult Note (Addendum)
[facility] Nurse Consult Note: Reason for Consult: Consult requested for buttocks.  Pt has a full thickness fissure to inner gluteal cleft related to moisture, NOT a pressure injury.  Red and moist, 3X.2X.1cm.  Pt is frequently incontinent of liquid stool and it is difficult to keep the affected area from becoming soiled.  Dressing procedure/placement/frequency: Topical treatment orders provided for bedside nurses to perform as follows to repel moisture and promote healing as follows: apply Gerharts butt cream BID and PRN when turning or cleaning. Please re-consult if further assistance is needed.  Thank-you,  Julien Girt MSN, [place], [place], [place], [place]

## 2021-02-03 NOTE — Progress Notes (Signed)
PROGRESS NOTE  [name] [id number] DOB: [birth year] DOA: 01/26/2021 PCP: Curlene Labrum, MD   LOS: 5 days   Brief narrative:  85 year old female with past medical history of diabetes mellitus type 2, hypertension, CKD stage IIIb, neuropathy had presented on 528 with all fall.  In the ED she did have extensive imaging without any acute abnormalities and left AGAINST MEDICAL ADVICE.  On reaching home she was profoundly weak and was unable to ambulate and was brought into [facility] initially.  Further imaging was performed.  Physical therapy evaluated.  She was recommended a skilled nursing facility but remained in the ED l for few days and continued to have the extremity weakness.  She did have acute urinary retention on 01/29/2021 so MRI of the spine was performed which showed C3-C4 cord compression.  Neurosurgery was consulted.  On 01/30/2021 patient had shock and PCCM was consulted and patient was transferred to the ICU.  Patient was briefly put on vasopressors.  Infectious disease was consulted as well.  At this time patient has been considered stable for transfer out of the ICU.   Assessment/Plan:  Principal Problem:   Cord compression [facility]) Active Problems:   Acute kidney injury superimposed on CKD ([place])   Type 2 diabetes mellitus with diabetic neuropathy, unspecified (HCC)   Acute lower UTI   Hyponatremia   Septic shock due to Klebsiella pneumoniae (HCC)   Metabolic acidosis with normal anion gap and failure of bicarbonate regeneration   Urinary retention  Cord compression from cervical stenosis.  Quadriparesis.  Neurosurgery on board.  Follow recommendations.  Bilateral lower extremities 3/5 strength.  Weakness more on the left side.  If patient continues to be remain afebrile patient could potentially go through surgery.  Neurosurgery has seen the patient today and plan is operative intervention.  Atrial fibrillation with RVR.  Was on amiodarone drip.  Subsequently  converted.  We will closely monitor  Metabolic encephalopathy due to UTI.  Improving.  Septic shock secondary to Klebsiella pneumonia UTI and bacteremia..  Continue antibiotic.  Blood cultures have been been repeated and are negative in 3 days..  Seen by ID today and recommend continuation of ceftriaxone today and transition to Keflex tomorrow.  End date 02/06/2021  Acute kidney injury on CKD 3B.  Improving creatinine levels.  Nephrology on board. Lab Results  Component Value Date   CREATININE 1.52 (H) 02/03/2021   CREATININE 1.81 (H) 02/02/2021   CREATININE 2.28 (H) 02/01/2021    Hyponatremia.  Continue to monitor closely.  Hypokalemia.  We will replenish.  Check levels in a.m.  Debility, deconditioning, extremity weakness.  Will likely need rehabilitation after surgical intervention.  DVT prophylaxis: SCDs Start: 01/29/21 2349 Place and maintain sequential compression device Start: 01/29/21 2346   Code Status: DNR  Family Communication: Spoke with the patient's daughter at bedside.  Status is: Inpatient  Remains inpatient appropriate because:IV treatments appropriate due to intensity of illness or inability to take PO, Inpatient level of care appropriate due to severity of illness and Possible need for surgical intervention, IV antibiotics, need for rehabilitation   Dispo: The patient is from: Home              Anticipated d/c is to: To be determined              Patient currently is not medically stable to d/c.   Difficult to place patient No   Consultants:  Neurosurgery  Infectious disease  PCCM  Procedures:  None so far  Anti-infectives:  Marland Kitchen Rocephin IV  Anti-infectives (From admission, onward)   Start     Dose/Rate Route Frequency Ordered Stop   02/04/21 0600  cephALEXin (KEFLEX) capsule 500 mg  Status:  Discontinued        500 mg Oral Every 8 hours 02/03/21 0931 02/03/21 0935   02/04/21 0600  cephALEXin (KEFLEX) 250 MG/5ML suspension 500 mg        500  mg Oral Every 8 hours 02/03/21 0935     01/30/21 1800  cefTRIAXone (ROCEPHIN) 1 g in sodium chloride 0.9 % 100 mL IVPB  Status:  Discontinued        1 g 200 mL/hr over 30 Minutes Intravenous Every 24 hours 01/29/21 2348 01/30/21 0826   01/30/21 1000  cefTRIAXone (ROCEPHIN) 2 g in sodium chloride 0.9 % 100 mL IVPB        2 g 200 mL/hr over 30 Minutes Intravenous Every 24 hours 01/30/21 0826 02/03/21 0943   01/29/21 1630  cefTRIAXone (ROCEPHIN) 1 g in sodium chloride 0.9 % 100 mL IVPB        1 g 200 mL/hr over 30 Minutes Intravenous  Once 01/29/21 1621 01/29/21 1855       Subjective: Today, patient was seen and examined at bedside.  Patient complains of discomfort on the back and weakness of the extremities.  Patient's daughter at bedside.  Objective: Vitals:   02/03/21 0504 02/03/21 0827  BP: (!) 106/44 113/61  Pulse: 76 81  Resp: 15 16  Temp: 97.8 F (36.6 C) 99.5 F (37.5 C)  SpO2: 94% 96%    Intake/Output Summary (Last 24 hours) at 02/03/2021 1127 Last data filed at 02/03/2021 0900 Gross per 24 hour  Intake 1287.95 ml  Output 1650 ml  Net -362.05 ml   Filed Weights   01/26/21 0848  Weight: 74.8 kg   Body mass index is 30.18 kg/m.   Physical Exam: GENERAL: Patient is alert awake and oriented. Not in obvious distress.  Mildly obese, HENT: No scleral pallor or icterus. Pupils equally reactive to light. Oral mucosa is moist NECK: is supple, no gross swelling noted. CHEST: Clear to auscultation. No crackles or wheezes.  Diminished breath sounds bilaterally.  Tenderness on the back. CVS: S1 and S2 heard, no murmur. Regular rate and rhythm.  ABDOMEN: Soft, non-tender, bowel sounds are present. EXTREMITIES: No edema. CNS: Cranial nerves are intact.  Weakness of the extremities mostly on the left lower extremity.Marland Kitchen SKIN: warm and dry without rashes.  Data Review: I have personally reviewed the following laboratory data and studies,  CBC: Recent Labs  Lab 01/29/21 1505  01/30/21 0410 01/31/21 0905 02/01/21 0504 02/02/21 0334  WBC 10.9* 11.6* 13.1* 10.7* 9.6  NEUTROABS 9.4*  --  11.3*  --   --   HGB 9.8* 8.8* 8.3* 7.8* 7.7*  HCT 29.0* 25.3* 23.6* 22.3* 22.1*  MCV 91.2 87.5 88.1 87.1 85.7  PLT 175 138* 153 132* [id number]*   Basic Metabolic Panel: Recent Labs  Lab 01/31/21 0905 01/31/21 0906 01/31/21 2358 02/01/21 0504 02/02/21 0334 02/03/21 0531  NA 128* 125* 122* 125* 125* 126*  K 5.1 4.8 4.2 4.1 3.5 3.3*  CL 102 98 92* 93* 88* 87*  CO2 13* 13* 17* '22 26 27  '$ GLUCOSE 155* 162* 242* 166* 162* 96  BUN 69* 67* 58* 54* 50* 42*  CREATININE 2.67* 2.77* 2.46* 2.28* 1.81* 1.52*  CALCIUM 7.6* 7.5* 7.1* 7.3* 7.1* 7.1*  MG 1.5*  --  1.5*  --  2.0  --   PHOS 4.5 4.4  --  3.1 2.2* 3.0   Liver Function Tests: Recent Labs  Lab 01/31/21 0906 02/01/21 0504 02/02/21 0334 02/03/21 0531  ALBUMIN 2.5* 2.1* 1.9* 1.7*   No results for input(s): LIPASE, AMYLASE in the last 168 hours. No results for input(s): AMMONIA in the last 168 hours. Cardiac Enzymes: No results for input(s): CKTOTAL, CKMB, CKMBINDEX, TROPONINI in the last 168 hours. BNP (last 3 results) No results for input(s): BNP in the last 8760 hours.  ProBNP (last 3 results) No results for input(s): PROBNP in the last 8760 hours.  CBG: Recent Labs  Lab 02/02/21 1710 02/02/21 2113 02/02/21 2347 02/03/21 0501 02/03/21 0823  GLUCAP 143* 136* 117* 96 105*   Recent Results (from the past 240 hour(s))  Resp Panel by RT-PCR (Flu A&B, Covid) Nasopharyngeal Swab     Status: None   Collection Time: 01/26/21  3:38 PM   Specimen: Nasopharyngeal Swab; Nasopharyngeal(NP) swabs in vial transport medium  Result Value Ref Range Status   SARS Coronavirus 2 by RT PCR NEGATIVE NEGATIVE Final    Comment: (NOTE) SARS-CoV-2 target nucleic acids are NOT DETECTED.  The SARS-CoV-2 RNA is generally detectable in upper respiratory specimens during the acute phase of infection. The lowest concentration of  SARS-CoV-2 viral copies this assay can detect is 138 copies/mL. A negative result does not preclude SARS-Cov-2 infection and should not be used as the sole basis for treatment or other patient management decisions. A negative result may occur with  improper specimen collection/handling, submission of specimen other than nasopharyngeal swab, presence of viral mutation(s) within the areas targeted by this assay, and inadequate number of viral copies(<138 copies/mL). A negative result must be combined with clinical observations, patient history, and epidemiological information. The expected result is Negative.  Fact Sheet for Patients:  EntrepreneurPulse.com.au  Fact Sheet for Healthcare Providers:  IncredibleEmployment.be  This test is no t yet approved or cleared by the [place] FDA and  has been authorized for detection and/or diagnosis of SARS-CoV-2 by FDA under an Emergency Use Authorization (EUA). This EUA will remain  in effect (meaning this test can be used) for the duration of the COVID-19 declaration under Section 564(b)(1) of the Act, 21 U.S.C.section 360bbb-3(b)(1), unless the authorization is terminated  or revoked sooner.       Influenza A by PCR NEGATIVE NEGATIVE Final   Influenza B by PCR NEGATIVE NEGATIVE Final    Comment: (NOTE) The Xpert Xpress SARS-CoV-2/FLU/RSV plus assay is intended as an aid in the diagnosis of influenza from Nasopharyngeal swab specimens and should not be used as a sole basis for treatment. Nasal washings and aspirates are unacceptable for Xpert Xpress SARS-CoV-2/FLU/RSV testing.  Fact Sheet for Patients: EntrepreneurPulse.com.au  Fact Sheet for Healthcare Providers: IncredibleEmployment.be  This test is not yet approved or cleared by the [place] FDA and has been authorized for detection and/or diagnosis of SARS-CoV-2 by FDA under an Emergency Use  Authorization (EUA). This EUA will remain in effect (meaning this test can be used) for the duration of the COVID-19 declaration under Section 564(b)(1) of the Act, 21 U.S.C. section 360bbb-3(b)(1), unless the authorization is terminated or revoked.  Performed at [facility], [address], [place], [place] [address]   Urine Culture     Status: Abnormal   Collection Time: 01/29/21  4:21 PM   Specimen: Urine, Random  Result Value Ref Range Status   Specimen Description   Final  URINE, RANDOM Performed at [facility], [address] [place], [place] [address]    Special Requests   Final    NONE Performed at [facility], [address], [place], [place] [address]    Culture >=100,000 COLONIES/mL KLEBSIELLA PNEUMONIAE (A)  Final   Report Status 02/01/2021 FINAL  Final   Organism ID, Bacteria KLEBSIELLA PNEUMONIAE (A)  Final      Susceptibility   Klebsiella pneumoniae - MIC*    AMPICILLIN RESISTANT Resistant     CEFAZOLIN <=4 SENSITIVE Sensitive     CEFEPIME <=0.12 SENSITIVE Sensitive     CEFTRIAXONE <=0.25 SENSITIVE Sensitive     CIPROFLOXACIN <=0.25 SENSITIVE Sensitive     GENTAMICIN <=1 SENSITIVE Sensitive     IMIPENEM <=0.25 SENSITIVE Sensitive     NITROFURANTOIN 64 INTERMEDIATE Intermediate     TRIMETH/SULFA <=20 SENSITIVE Sensitive     AMPICILLIN/SULBACTAM 4 SENSITIVE Sensitive     PIP/TAZO <=4 SENSITIVE Sensitive     * >=100,000 COLONIES/mL KLEBSIELLA PNEUMONIAE  Culture, blood (Routine X 2) w Reflex to ID Panel     Status: Abnormal   Collection Time: 01/29/21  5:30 PM   Specimen: BLOOD RIGHT ARM  Result Value Ref Range Status   Specimen Description   Final    BLOOD RIGHT ARM Performed at [facility], [address] [place], [place] [address]    Special Requests   Final    BOTTLES DRAWN AEROBIC AND ANAEROBIC Blood Culture adequate volume Performed at [facility], [address] [place], [place] [address]    Culture  Setup Time   Final    GRAM NEGATIVE RODS  AEROBIC AND ANAEROBIC BOTTLES Gram Stain Report Called to,Read Back By and Verified With: EVernona Rieger (PHARMACY @ [place]) 01/30/21 @ 0819 BY [place] Performed at [facility], [address], [place], [place] [address]    Culture (A)  Final    KLEBSIELLA PNEUMONIAE SUSCEPTIBILITIES PERFORMED ON PREVIOUS CULTURE WITHIN THE LAST 5 DAYS. Performed at [facility], [place] [address], [place], [place] [address]    Report Status 02/01/2021 FINAL  Final  Culture, blood (Routine X 2) w Reflex to ID Panel     Status: Abnormal   Collection Time: 01/29/21  5:30 PM   Specimen: BLOOD LEFT ARM  Result Value Ref Range Status   Specimen Description   Final    BLOOD LEFT ARM Performed at [facility], [address], [place], [place] [address]    Special Requests   Final    BOTTLES DRAWN AEROBIC AND ANAEROBIC Blood Culture adequate volume Performed at [facility], [address] [place],  [address]    Culture  Setup Time   Final    GRAM NEGATIVE RODS BOTH AEROBIC AND ANAEROBIC BOTTLES Gram Stain Report Called to,Read Back By and Verified With: E. SINCLAIR @ [facility]ARM[facility] 01/30/21 @ 0819 BY S BEARD    Culture KLEBSIELLA PNEUMONIAE (A)  Final   Report Status 02/01/2021 FINAL  Final   Organism ID, Bacteria KLEBSIELLA PNEUMONIAE  Final      Susceptibility   Klebsiella pneumoniae - MIC*    AMPICILLIN RESISTANT Resistant     CEFAZOLIN <=4 SENSITIVE Sensitive     CEFEPIME <=0.12 SENSITIVE Sensitive     CEFTAZIDIME <=1 SENSITIVE Sensitive     CEFTRIAXONE <=0.25 SENSITIVE Sensitive     CIPROFLOXACIN <=0.25 SENSITIVE Sensitive     GENTAMICIN <=1 SENSITIVE Sensitive     IMIPENEM <=0.25 SENSITIVE Sensitive     TRIMETH/SULFA <=  20 SENSITIVE Sensitive     AMPICILLIN/SULBACTAM 4 SENSITIVE Sensitive     PIP/TAZO <=4 SENSITIVE Sensitive     * KLEBSIELLA PNEUMONIAE  Blood Culture ID Panel (Reflexed)     Status: Abnormal   Collection Time: 01/29/21  5:30 PM  Result Value Ref Range Status   Enterococcus  faecalis NOT DETECTED NOT DETECTED Final   Enterococcus Faecium NOT DETECTED NOT DETECTED Final   Listeria monocytogenes NOT DETECTED NOT DETECTED Final   Staphylococcus species NOT DETECTED NOT DETECTED Final   Staphylococcus aureus (BCID) NOT DETECTED NOT DETECTED Final   Staphylococcus epidermidis NOT DETECTED NOT DETECTED Final   Staphylococcus lugdunensis NOT DETECTED NOT DETECTED Final   Streptococcus species NOT DETECTED NOT DETECTED Final   Streptococcus agalactiae NOT DETECTED NOT DETECTED Final   Streptococcus pneumoniae NOT DETECTED NOT DETECTED Final   Streptococcus pyogenes NOT DETECTED NOT DETECTED Final   A.calcoaceticus-baumannii NOT DETECTED NOT DETECTED Final   Bacteroides fragilis NOT DETECTED NOT DETECTED Final   Enterobacterales DETECTED (A) NOT DETECTED Final    Comment: Enterobacterales represent a large order of gram negative bacteria, not a single organism. CRITICAL RESULT CALLED TO, READ BACK BY AND VERIFIED WITH: EMILY SINCLAIR AT 1404 ON 01/30/21 [place] KJ    Enterobacter cloacae complex NOT DETECTED NOT DETECTED Final   Escherichia coli NOT DETECTED NOT DETECTED Final   Klebsiella aerogenes NOT DETECTED NOT DETECTED Final   Klebsiella oxytoca NOT DETECTED NOT DETECTED Final   Klebsiella pneumoniae DETECTED (A) NOT DETECTED Final    Comment: CRITICAL RESULT CALLED TO, READ BACK BY AND VERIFIED WITH: PHARMD EMILY SINCLAIR AT 1404 ON 01/30/21 BY KJ    Proteus species NOT DETECTED NOT DETECTED Final   Salmonella species NOT DETECTED NOT DETECTED Final   Serratia marcescens NOT DETECTED NOT DETECTED Final   Haemophilus influenzae NOT DETECTED NOT DETECTED Final   Neisseria meningitidis NOT DETECTED NOT DETECTED Final   Pseudomonas aeruginosa NOT DETECTED NOT DETECTED Final   Stenotrophomonas maltophilia NOT DETECTED NOT DETECTED Final   Candida albicans NOT DETECTED NOT DETECTED Final   Candida auris NOT DETECTED NOT DETECTED Final   Candida glabrata NOT DETECTED  NOT DETECTED Final   Candida krusei NOT DETECTED NOT DETECTED Final   Candida parapsilosis NOT DETECTED NOT DETECTED Final   Candida tropicalis NOT DETECTED NOT DETECTED Final   Cryptococcus neoformans/gattii NOT DETECTED NOT DETECTED Final   CTX-M ESBL NOT DETECTED NOT DETECTED Final   Carbapenem resistance IMP NOT DETECTED NOT DETECTED Final   Carbapenem resistance KPC NOT DETECTED NOT DETECTED Final   Carbapenem resistance NDM NOT DETECTED NOT DETECTED Final   Carbapenem resist OXA 48 LIKE NOT DETECTED NOT DETECTED Final   Carbapenem resistance VIM NOT DETECTED NOT DETECTED Final    Comment: Performed at [facility], [address] [place], [place] [address]  Resp Panel by RT-PCR (Flu A&B, Covid) Nasopharyngeal Swab     Status: None   Collection Time: 01/29/21  7:18 PM   Specimen: Nasopharyngeal Swab; Nasopharyngeal(NP) swabs in vial transport medium  Result Value Ref Range Status   SARS Coronavirus 2 by RT PCR NEGATIVE NEGATIVE Final    Comment: (NOTE) SARS-CoV-2 target nucleic acids are NOT DETECTED.  The SARS-CoV-2 RNA is generally detectable in upper respiratory specimens during the acute phase of infection. The lowest concentration of SARS-CoV-2 viral copies this assay can detect is 138 copies/mL. A negative result does not preclude SARS-Cov-2 infection and should not be used as the sole  basis for treatment or other patient management decisions. A negative result may occur with  improper specimen collection/handling, submission of specimen other than nasopharyngeal swab, presence of viral mutation(s) within the areas targeted by this assay, and inadequate number of viral copies(<138 copies/mL). A negative result must be combined with clinical observations, patient history, and epidemiological information. The expected result is Negative.  Fact Sheet for Patients:  EntrepreneurPulse.com.au  Fact Sheet for Healthcare Providers:   IncredibleEmployment.be  This test is no t yet approved or cleared by the [place] FDA and  has been authorized for detection and/or diagnosis of SARS-CoV-2 by FDA under an Emergency Use Authorization (EUA). This EUA will remain  in effect (meaning this test can be used) for the duration of the COVID-19 declaration under Section 564(b)(1) of the Act, 21 U.S.C.section 360bbb-3(b)(1), unless the authorization is terminated  or revoked sooner.       Influenza A by PCR NEGATIVE NEGATIVE Final   Influenza B by PCR NEGATIVE NEGATIVE Final    Comment: (NOTE) The Xpert Xpress SARS-CoV-2/FLU/RSV plus assay is intended as an aid in the diagnosis of influenza from Nasopharyngeal swab specimens and should not be used as a sole basis for treatment. Nasal washings and aspirates are unacceptable for Xpert Xpress SARS-CoV-2/FLU/RSV testing.  Fact Sheet for Patients: EntrepreneurPulse.com.au  Fact Sheet for Healthcare Providers: IncredibleEmployment.be  This test is not yet approved or cleared by the [place] FDA and has been authorized for detection and/or diagnosis of SARS-CoV-2 by FDA under an Emergency Use Authorization (EUA). This EUA will remain in effect (meaning this test can be used) for the duration of the COVID-19 declaration under Section 564(b)(1) of the Act, 21 U.S.C. section 360bbb-3(b)(1), unless the authorization is terminated or revoked.  Performed at [facility], [address], [place], [place] [address]   MRSA PCR Screening     Status: None   Collection Time: 01/30/21  1:18 PM   Specimen: Nasal Mucosa; Nasopharyngeal  Result Value Ref Range Status   MRSA by PCR NEGATIVE NEGATIVE Final    Comment:        The GeneXpert MRSA Assay (FDA approved for NASAL specimens only), is one component of a comprehensive MRSA colonization surveillance program. It is not intended to diagnose MRSA infection nor to guide  or monitor treatment for MRSA infections. Performed at [facility], [place] [address] [place], [place] [address]   Culture, blood (Routine X 2) w Reflex to ID Panel     Status: None (Preliminary result)   Collection Time: 01/31/21  7:19 PM   Specimen: BLOOD LEFT HAND  Result Value Ref Range Status   Specimen Description BLOOD LEFT HAND  Final   Special Requests   Final    BOTTLES DRAWN AEROBIC AND ANAEROBIC Blood Culture results may not be optimal due to an inadequate volume of blood received in culture bottles   Culture   Final    NO GROWTH 3 DAYS Performed at [facility], [place] [address] [place], [place] [address]    Report Status PENDING  Incomplete  Culture, blood (Routine X 2) w Reflex to ID Panel     Status: None (Preliminary result)   Collection Time: 01/31/21  7:19 PM   Specimen: BLOOD RIGHT HAND  Result Value Ref Range Status   Specimen Description BLOOD RIGHT HAND  Final   Special Requests   Final    BOTTLES DRAWN AEROBIC ONLY Blood Culture adequate volume   Culture  Final    NO GROWTH 3 DAYS Performed at [facility], [place] [address] [place], [place] [address]    Report Status PENDING  Incomplete     Studies: No results found.    Flora Lipps, MD  [facility] 02/03/2021  If 7PM-7AM, please contact night-coverage

## 2021-02-03 NOTE — Progress Notes (Signed)
Patient ID: [name], female   DOB: [birth year], 85 y.o.   MRN: [id number] Will schedule for OR

## 2021-02-04 ENCOUNTER — Encounter (HOSPITAL_COMMUNITY)
Admission: EM | Disposition: A | Discharge status: Skilled Nursing Facility | Payer: Self-pay | Source: Home / Self Care | Attending: Neurosurgery

## 2021-02-04 ENCOUNTER — Other Ambulatory Visit: Payer: Self-pay | Admitting: Neurosurgery

## 2021-02-04 DIAGNOSIS — I809 Phlebitis and thrombophlebitis of unspecified site: Secondary | ICD-10-CM

## 2021-02-04 LAB — CBC
HCT: 22 % — ABNORMAL LOW (ref 36.0–46.0)
Hemoglobin: 7.6 g/dL — ABNORMAL LOW (ref 12.0–15.0)
MCH: 29.5 pg (ref 26.0–34.0)
MCHC: 34.5 g/dL (ref 30.0–36.0)
MCV: 85.3 fL (ref 80.0–100.0)
Platelets: 212 10*3/uL (ref 150–400)
RBC: 2.58 MIL/uL — ABNORMAL LOW (ref 3.87–5.11)
RDW: 14.6 % (ref 11.5–15.5)
WBC: 9.8 10*3/uL (ref 4.0–10.5)
nRBC: 0 % (ref 0.0–0.2)

## 2021-02-04 LAB — COMPREHENSIVE METABOLIC PANEL
ALT: 57 U/L — ABNORMAL HIGH (ref 0–44)
AST: 62 U/L — ABNORMAL HIGH (ref 15–41)
Albumin: 1.7 g/dL — ABNORMAL LOW (ref 3.5–5.0)
Alkaline Phosphatase: 118 U/L (ref 38–126)
Anion gap: 9 (ref 5–15)
BUN: 40 mg/dL — ABNORMAL HIGH (ref 8–23)
CO2: 29 mmol/L (ref 22–32)
Calcium: 7.6 mg/dL — ABNORMAL LOW (ref 8.9–10.3)
Chloride: 89 mmol/L — ABNORMAL LOW (ref 98–111)
Creatinine, Ser: 1.4 mg/dL — ABNORMAL HIGH (ref 0.44–1.00)
GFR, Estimated: 38 mL/min — ABNORMAL LOW (ref >60)
Glucose, Bld: 116 mg/dL — ABNORMAL HIGH (ref 70–99)
Potassium: 3.4 mmol/L — ABNORMAL LOW (ref 3.5–5.1)
Sodium: 127 mmol/L — ABNORMAL LOW (ref 135–145)
Total Bilirubin: 0.9 mg/dL (ref 0.3–1.2)
Total Protein: 4.8 g/dL — ABNORMAL LOW (ref 6.5–8.1)

## 2021-02-04 LAB — GLUCOSE, CAPILLARY
Glucose-Capillary: 107 mg/dL — ABNORMAL HIGH (ref 70–99)
Glucose-Capillary: 109 mg/dL — ABNORMAL HIGH (ref 70–99)
Glucose-Capillary: 126 mg/dL — ABNORMAL HIGH (ref 70–99)
Glucose-Capillary: 154 mg/dL — ABNORMAL HIGH (ref 70–99)
Glucose-Capillary: 188 mg/dL — ABNORMAL HIGH (ref 70–99)
Glucose-Capillary: 208 mg/dL — ABNORMAL HIGH (ref 70–99)
Glucose-Capillary: 243 mg/dL — ABNORMAL HIGH (ref 70–99)

## 2021-02-04 LAB — SURGICAL PCR SCREEN
MRSA, PCR: NEGATIVE
Staphylococcus aureus: NEGATIVE

## 2021-02-04 LAB — MAGNESIUM: Magnesium: 2 mg/dL (ref 1.7–2.4)

## 2021-02-04 LAB — PHOSPHORUS: Phosphorus: 3.3 mg/dL (ref 2.5–4.6)

## 2021-02-04 SURGERY — ANTERIOR CERVICAL DECOMPRESSION/DISCECTOMY FUSION 1 LEVEL/HARDWARE REMOVAL
Anesthesia: General

## 2021-02-04 NOTE — Progress Notes (Signed)
[facility] for Infectious Disease  Date of Admission:  01/26/2021           Reason for visit: Follow up on bacteremia  Current antibiotics: Keflex 6/7--present  Previous antibiotics: Ceftriaxone 6/1--present   ASSESSMENT:    1. Klebsiella pneumonia bacteremia: Secondary to a urinary source presenting with severe sepsis/shock and fevers.  She has had resolution of her fevers with been afebrile now for the past 24 hours.  T-max 99.8.  Blood cultures were repeated on 01/31/2021 and remain no growth to date. 2. Right forearm superficial phlebitis/cellulitis: Noted this morning at the site of her previous PIV. 3. AKI: Creatinine improving 4. Cervical cord compression from spinal stenosis: Neurosurgery following and plan for ACDF possibly Wednesday  PLAN:    . Continue Keflex 500 mg every 8 hours . Duration of therapy may need to be extended briefly due to right upper extremity cellulitis, however, should be treated by current antibiotics . Follow fever curve . OR planning per neurosurgery   Principal Problem:   Cord compression [facility]) Active Problems:   Acute kidney injury superimposed on CKD ([place])   Type 2 diabetes mellitus with diabetic neuropathy, unspecified (HCC)   Acute lower UTI   Hyponatremia   Septic shock due to Klebsiella pneumoniae (HCC)   Metabolic acidosis with normal anion gap and failure of bicarbonate regeneration   Urinary retention    MEDICATIONS:    Scheduled Meds: . amiodarone  200 mg Oral Daily  . atorvastatin  20 mg Oral QPM  . cephALEXin  500 mg Oral Q8H  . Chlorhexidine Gluconate Cloth  6 each Topical Daily  . feeding supplement  237 mL Oral BID BM  . Gerhardt's butt cream   Topical BID  . insulin aspart  0-20 Units Subcutaneous Q4H  . multivitamin with minerals  1 tablet Oral Daily  . pantoprazole  40 mg Oral Daily  . polyethylene glycol  17 g Oral Daily  . senna  2 tablet Oral Daily   Continuous Infusions: . sodium chloride 250  mL (02/01/21 1033)  . sodium chloride 0 mL (02/01/21 1245)  . lactated ringers Stopped (02/03/21 1659)   PRN Meds:.Place/Maintain arterial line **AND** sodium chloride, acetaminophen **OR** acetaminophen, diphenhydrAMINE, Gerhardt's butt cream, HYDROcodone-acetaminophen, methocarbamol, ondansetron **OR** ondansetron (ZOFRAN) IV, polyethylene glycol  SUBJECTIVE:   24 hour events:  NAEO Afebrile Possible OR on Wednesday Creatinine improved  Patient reports no fevers or chills.  She is asking to be turned in bed.  She wants to talk to neurosurgery more regarding possible intervention.  She has some pain and swelling of her right hand at previous IV site  Review of Systems  Constitutional: Negative for chills and fever.  Respiratory: Negative.   Gastrointestinal: Negative.   Genitourinary: Negative.   Skin: Positive for rash.  Neurological: Positive for focal weakness.      OBJECTIVE:   Blood pressure (!) 138/57, pulse 83, temperature 98.5 F (36.9 C), temperature source Oral, resp. rate 16, height '5\' 2"'$  (1.575 m), weight 74.8 kg, SpO2 94 %. Body mass index is 30.18 kg/m.  Physical Exam Constitutional:      Comments: Chronically ill appearing, lying in bed, NAD.   HENT:     Head: Normocephalic and atraumatic.  Eyes:     Extraocular Movements: Extraocular movements intact.     Conjunctiva/sclera: Conjunctivae normal.  Pulmonary:     Effort: Pulmonary effort is normal. No respiratory distress.  Musculoskeletal:     Cervical back: Normal  range of motion and neck supple.  Skin:    General: Skin is warm and dry.     Findings: Erythema present.     Comments: Warmth and erythema noted at previous PIV site right hand.   Neurological:     General: No focal deficit present.     Mental Status: Mental status is at baseline.     Motor: Weakness present.      Lab Results: Lab Results  Component Value Date   WBC 9.8 02/04/2021   HGB 7.6 (L) 02/04/2021   HCT 22.0 (L)  02/04/2021   MCV 85.3 02/04/2021   PLT 212 02/04/2021    Lab Results  Component Value Date   NA 127 (L) 02/04/2021   K 3.4 (L) 02/04/2021   CO2 29 02/04/2021   GLUCOSE 116 (H) 02/04/2021   BUN 40 (H) 02/04/2021   CREATININE 1.40 (H) 02/04/2021   CALCIUM 7.6 (L) 02/04/2021   GFRNONAA 38 (L) 02/04/2021   GFRAA 48 (L) 08/17/2019    Lab Results  Component Value Date   ALT 57 (H) 02/04/2021   AST 62 (H) 02/04/2021   ALKPHOS 118 02/04/2021   BILITOT 0.9 02/04/2021       Component Value Date/Time   CRP 30.3 (H) 02/01/2021 0504       Component Value Date/Time   ESRSEDRATE 137 (H) 02/01/2021 0504     I have reviewed the micro and lab results in Epic.  Imaging: No results found.   Imaging independently reviewed in Epic.    Rayn[facility] [phone] pager 02/04/2021, 8:48 AM  I spent greater than 35 minutes with the patient including greater than 50% of time in face to face counsel of the patient and in coordination of their care.

## 2021-02-04 NOTE — Progress Notes (Signed)
Patient ID: [name], female   DOB: 1936/06/20, 85 y.o.   MRN: [id number] BP (!) 142/48 (BP Location: Right Arm)   Pulse 86   Temp 98.6 F (37 C) (Oral)   Resp 20   Ht '5\' 2"'$  (1.575 m)   Wt 74.8 kg   SpO2 97%   BMI 30.18 kg/m  OR tomorrow Improved Moving upper extremities weakly, lower extremities no better than a 2/5

## 2021-02-04 NOTE — Plan of Care (Signed)
Pt A&Ox4, pt compliant with medication and care. BP (!) 148/60 (BP Location: Left Arm)   Pulse 91   Temp 98.5 F (36.9 C) (Oral)   Resp 20   Ht '5\' 2"'$  (1.575 m)   Wt 74.8 kg   SpO2 95%   BMI 30.18 kg/m  no pain verbalized at this time. Pt drank ensure/supplement. No s/s of acute distress.  Hal Neer.

## 2021-02-04 NOTE — Care Management Important Message (Signed)
Important Message  Patient Details  Name: [name] MRN: [id number] Date of Birth: [birth year]   Medicare Important Message Given:  Yes     [name] 02/04/2021, 10:40 AM

## 2021-02-04 NOTE — Progress Notes (Signed)
PROGRESS NOTE  TELECIA HINA [id number] DOB: 02-13-1936 DOA: 01/26/2021 PCP: Curlene Labrum, MD   LOS: 6 days   Brief narrative:  85 year old female with past medical history of diabetes mellitus type 2, hypertension, CKD stage IIIb, neuropathy had presented on 528 with all fall.  In the ED she did have extensive imaging without any acute abnormalities and left AGAINST MEDICAL ADVICE.  On reaching home, she was profoundly weak and was unable to ambulate and was brought into [facility] initially.  Further imaging was performed.  Physical therapy evaluated.  She was recommended a skilled nursing facility but remained in the ED l for few days and continued to have the extremity weakness.  She did have acute urinary retention on 01/29/2021 so MRI of the spine was performed which showed C3-C4 cord compression.  Neurosurgery was consulted.  On 01/30/2021 patient had shock and PCCM was consulted and patient was transferred to the ICU.  Patient was briefly put on vasopressors.  Infectious disease was consulted as well.  At this time, patient has been considered stable for transfer out of the ICU.   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  Quadriparesis.  Neurosurgery on board.  Follow recommendations.  Bilateral lower extremities 3/5 strength.  Weakness more on the left side.  Surgery planning for surgical intervention.  Atrial fibrillation with RVR.  Was on amiodarone drip.  Subsequently converted.  We will closely monitor  Metabolic encephalopathy due to UTI.  Improving.  Septic shock secondary to  Klebsiella pneumonia UTI and bacteremia..   Blood cultures have been been repeated and are negative in 3 days..  Was on Rocephin IV and has been transitioned to Keflex starting today.  End date 02/06/2021  Acute kidney injury on CKD 3B.  Improving creatinine levels.  Nephrology on board.  Lab Results  Component Value Date   CREATININE 1.40 (H) 02/04/2021   CREATININE 1.52 (H) 02/03/2021   CREATININE 1.81 (H) 02/02/2021    Hyponatremia.  Continue to monitor closely.  Asymptomatic but will closely monitor.  Hypokalemia.  We will replenish.  Check levels in a.m.  Debility, deconditioning, extremity weakness.  Will likely need rehabilitation after surgical intervention.  DVT prophylaxis: SCDs Start: 01/29/21 2349 Place and maintain sequential compression device Start: 01/29/21 2346   Code Status: DNR  Family Communication: None today.  Spoke with the patient's daughter at bedside yesterday.  Status is: Inpatient  Remains inpatient appropriate because:IV treatments appropriate due to intensity of illness or inability to take PO, Inpatient level of care appropriate due to severity of illness and Possible need for surgical intervention, IV antibiotics, need for rehabilitation   Dispo: The patient is from: Home              Anticipated d/c is to: To be determined              Patient currently is not medically stable to d/c.   Difficult to place patient No   Consultants:  Neurosurgery  Infectious disease  PCCM  Procedures:  None so far  Anti-infectives:  Marland Kitchen Rocephin IV  Anti-infectives (From admission, onward)  Start     Dose/Rate Route Frequency Ordered Stop   02/04/21 0600  cephALEXin (KEFLEX) capsule 500 mg  Status:  Discontinued        500 mg Oral Every 8 hours 02/03/21 0931 02/03/21 0935   02/04/21 0600  cephALEXin (KEFLEX) 250 MG/5ML suspension 500 mg        500 mg Oral Every 8 hours 02/03/21 0935 02/06/21 2359   01/30/21 1800  cefTRIAXone (ROCEPHIN) 1 g in  sodium chloride 0.9 % 100 mL IVPB  Status:  Discontinued        1 g 200 mL/hr over 30 Minutes Intravenous Every 24 hours 01/29/21 2348 01/30/21 0826   01/30/21 1000  cefTRIAXone (ROCEPHIN) 2 g in sodium chloride 0.9 % 100 mL IVPB        2 g 200 mL/hr over 30 Minutes Intravenous Every 24 hours 01/30/21 0826 02/03/21 1658   01/29/21 1630  cefTRIAXone (ROCEPHIN) 1 g in sodium chloride 0.9 % 100 mL IVPB        1 g 200 mL/hr over 30 Minutes Intravenous  Once 01/29/21 1621 01/29/21 1855     Subjective: Today, patient was seen and examined at bedside.  Complains of discomfort on the back and feels a little restless.  Objective: Vitals:   02/04/21 0740 02/04/21 1100  BP: (!) 138/57 (!) 148/60  Pulse: 83 91  Resp: 16 20  Temp: 98.5 F (36.9 C) 98.5 F (36.9 C)  SpO2: 94% 95%    Intake/Output Summary (Last 24 hours) at 02/04/2021 1224 Last data filed at 02/04/2021 1133 Gross per 24 hour  Intake 629.86 ml  Output 1750 ml  Net -1120.14 ml   Filed Weights   01/26/21 0848  Weight: 74.8 kg   Body mass index is 30.18 kg/m.   Physical Exam: GENERAL: Patient is alert awake and oriented. Not in obvious distress.  Mildly obese, HENT: No scleral pallor or icterus. Pupils equally reactive to light. Oral mucosa is moist NECK: is supple, no gross swelling noted. CHEST: Clear to auscultation. No crackles or wheezes.  Diminished breath sounds bilaterally.  Tenderness over the back CVS: S1 and S2 heard, no murmur. Regular rate and rhythm.  ABDOMEN: Soft, non-tender, bowel sounds are present. EXTREMITIES: No edema. CNS: Cranial nerves are intact.  Weakness of lower extremities  SKIN: warm and dry without rashes.  Data Review: I have personally reviewed the following laboratory data and studies,  CBC: Recent Labs  Lab 01/29/21 1505 01/30/21 0410 01/31/21 0905 02/01/21 0504 02/02/21 0334 02/04/21 0522  WBC 10.9* 11.6* 13.1* 10.7* 9.6 9.8  NEUTROABS 9.4*  --  11.3*  --   --   --   HGB  9.8* 8.8* 8.3* 7.8* 7.7* 7.6*  HCT 29.0* 25.3* 23.6* 22.3* 22.1* 22.0*  MCV 91.2 87.5 88.1 87.1 85.7 85.3  PLT 175 138* 153 132* 145* [id number]   Basic Metabolic Panel: Recent Labs  Lab 01/31/21 0905 01/31/21 0906 01/31/21 2358 02/01/21 0504 02/02/21 0334 02/03/21 0531 02/04/21 0522  NA 128* 125* 122* 125* 125* 126* 127*  K 5.1 4.8 4.2 4.1 3.5 3.3* 3.4*  CL 102 98 92* 93* 88* 87* 89*  CO2 13* 13* 17* '22 26 27 29  '$ GLUCOSE 155* 162* 242* 166* 162* 96 116*  BUN 69* 67* 58* 54* 50* 42* 40*  CREATININE 2.67* 2.77* 2.46* 2.28* 1.81* 1.52* 1.40*  CALCIUM 7.6* 7.5* 7.1* 7.3* 7.1* 7.1* 7.6*  MG 1.5*  --  1.5*  --  2.0  --  2.0  PHOS 4.5 4.4  --  3.1 2.2* 3.0 3.3   Liver Function Tests: Recent Labs  Lab 01/31/21 0906 02/01/21 0504 02/02/21 0334 02/03/21 0531 02/04/21 0522  AST  --   --   --   --  62*  ALT  --   --   --   --  57*  ALKPHOS  --   --   --   --  118  BILITOT  --   --   --   --  0.9  PROT  --   --   --   --  4.8*  ALBUMIN 2.5* 2.1* 1.9* 1.7* 1.7*   No results for input(s): LIPASE, AMYLASE in the last 168 hours. No results for input(s): AMMONIA in the last 168 hours. Cardiac Enzymes: No results for input(s): CKTOTAL, CKMB, CKMBINDEX, TROPONINI in the last 168 hours. BNP (last 3 results) No results for input(s): BNP in the last 8760 hours.  ProBNP (last 3 results) No results for input(s): PROBNP in the last 8760 hours.  CBG: Recent Labs  Lab 02/03/21 1942 02/04/21 0002 02/04/21 0348 02/04/21 0740 02/04/21 1140  GLUCAP 114* 107* 109* 126* 208*   Recent Results (from the past 240 hour(s))  Resp Panel by RT-PCR (Flu A&B, Covid) Nasopharyngeal Swab     Status: None   Collection Time: 01/26/21  3:38 PM   Specimen: Nasopharyngeal Swab; Nasopharyngeal(NP) swabs in vial transport medium  Result Value Ref Range Status   SARS Coronavirus 2 by RT PCR NEGATIVE NEGATIVE Final    Comment: (NOTE) SARS-CoV-2 target nucleic acids are NOT DETECTED.  The SARS-CoV-2 RNA is  generally detectable in upper respiratory specimens during the acute phase of infection. The lowest concentration of SARS-CoV-2 viral copies this assay can detect is 138 copies/mL. A negative result does not preclude SARS-Cov-2 infection and should not be used as the sole basis for treatment or other patient management decisions. A negative result may occur with  improper specimen collection/handling, submission of specimen other than nasopharyngeal swab, presence of viral mutation(s) within the areas targeted by this assay, and inadequate number of viral copies(<138 copies/mL). A negative result must be combined with clinical observations, patient history, and epidemiological information. The expected result is Negative.  Fact Sheet for Patients:  EntrepreneurPulse.com.au  Fact Sheet for Healthcare Providers:  IncredibleEmployment.be  This test is no t yet approved or cleared by the [place] FDA and  has been authorized for detection and/or diagnosis of SARS-CoV-2 by FDA under an Emergency Use Authorization (EUA). This EUA will remain  in effect (meaning this test can be used) for the duration of the COVID-19 declaration under Section 564(b)(1) of the Act, 21 U.S.C.section 360bbb-3(b)(1), unless the authorization is terminated  or revoked sooner.       Influenza A by PCR NEGATIVE NEGATIVE Final   Influenza B by PCR NEGATIVE NEGATIVE Final    Comment: (NOTE) The Xpert Xpress SARS-CoV-2/FLU/RSV plus assay is intended as an aid in the diagnosis of influenza from Nasopharyngeal swab specimens and should not be used as a sole basis for treatment. Nasal washings and aspirates are unacceptable for Xpert Xpress SARS-CoV-2/FLU/RSV testing.  Fact Sheet for Patients: EntrepreneurPulse.com.au  Fact Sheet for Healthcare Providers: IncredibleEmployment.be  This test is not yet approved or cleared by the [place] FDA and has been authorized for detection and/or diagnosis of SARS-CoV-2 by FDA under an Emergency Use Authorization (EUA). This EUA will remain in effect (meaning this test can be used)  for the duration of the COVID-19 declaration under Section 564(b)(1) of the Act, 21 U.S.C. section 360bbb-3(b)(1), unless the authorization is terminated or revoked.  Performed at [facility], [address] [place], [place] [address]   Urine Culture     Status: Abnormal   Collection Time: 01/29/21  4:21 PM   Specimen: Urine, Random  Result Value Ref Range Status   Specimen Description   Final    URINE, RANDOM Performed at [facility], [address], [place], [place] [address]    Special Requests   Final    NONE Performed at [facility], [address] [place], [place] [address]    Culture >=100,000 COLONIES/mL KLEBSIELLA PNEUMONIAE (A)  Final   Report Status 02/01/2021 FINAL  Final   Organism ID, Bacteria KLEBSIELLA PNEUMONIAE (A)  Final      Susceptibility   Klebsiella pneumoniae - MIC*    AMPICILLIN RESISTANT Resistant     CEFAZOLIN <=4 SENSITIVE Sensitive     CEFEPIME <=0.12 SENSITIVE Sensitive     CEFTRIAXONE <=0.25 SENSITIVE Sensitive     CIPROFLOXACIN <=0.25 SENSITIVE Sensitive     GENTAMICIN <=1 SENSITIVE Sensitive     IMIPENEM <=0.25 SENSITIVE Sensitive     NITROFURANTOIN 64 INTERMEDIATE Intermediate     TRIMETH/SULFA <=20 SENSITIVE Sensitive     AMPICILLIN/SULBACTAM 4 SENSITIVE Sensitive     PIP/TAZO <=4 SENSITIVE Sensitive     * >=100,000 COLONIES/mL KLEBSIELLA PNEUMONIAE  Culture, blood (Routine X 2) w Reflex to ID Panel     Status: Abnormal   Collection Time: 01/29/21  5:30 PM   Specimen: BLOOD RIGHT ARM  Result Value Ref Range Status   Specimen Description   Final    BLOOD RIGHT ARM Performed at [facility], [address] [place], [place] [address]    Special Requests   Final    BOTTLES DRAWN AEROBIC AND ANAEROBIC Blood Culture adequate volume Performed at  [facility], [address] [place], [place] [address]    Culture  Setup Time   Final    GRAM NEGATIVE RODS AEROBIC AND ANAEROBIC BOTTLES Gram Stain Report Called to,Read Back By and Verified With: EVernona Rieger (PHARMACY @ [place]) 01/30/21 @ 0819 BY [place] Haw Performed at [facility], [address] [place], [place] [address]    Culture (A)  Final    KLEBSIELLA PNEUMONIAE SUSCEPTIBILITIES PERFORMED ON PREVIOUS CULTURE WITHIN THE LAST 5 DAYS. Performed at [facility], [place] [address], [place], [place] [address]    Report Status 02/01/2021 FINAL  Final  Culture, blood (Routine X 2) w Reflex to ID Panel     Status: Abnormal   Collection Time: 01/29/21  5:30 PM   Specimen: BLOOD LEFT ARM  Result Value Ref Range Status   Specimen Description   Final    BLOOD LEFT ARM Performed at [facility], [address], [place], [place] [address]    Special Requests   Final    BOTTLES DRAWN AEROBIC AND ANAEROBIC Blood Culture adequate volume Performed at [facility], [address] [place], [place] [address]    Culture  Setup Time   Final    GRAM NEGATIVE RODS BOTH AEROBIC AND ANAEROBIC BOTTLES Gram Stain Report Called to,Read Back By and Verified With: E. SINCLAIR @ [place] 01/30/21 @ 0819 BY S BEARD    Culture KLEBSIELLA PNEUMONIAE (A)  Final   Report Status 02/01/2021 FINAL  Final   Organism ID, Bacteria KLEBSIELLA PNEUMONIAE  Final  Susceptibility   Klebsiella pneumoniae - MIC*    AMPICILLIN RESISTANT Resistant     CEFAZOLIN <=4 SENSITIVE Sensitive     CEFEPIME <=0.12 SENSITIVE Sensitive     CEFTAZIDIME <=1 SENSITIVE Sensitive     CEFTRIAXONE <=0.25 SENSITIVE Sensitive     CIPROFLOXACIN <=0.25 SENSITIVE Sensitive     GENTAMICIN <=1 SENSITIVE Sensitive     IMIPENEM <=0.25 SENSITIVE Sensitive     TRIMETH/SULFA <=20 SENSITIVE Sensitive     AMPICILLIN/SULBACTAM 4 SENSITIVE Sensitive     PIP/TAZO <=4 SENSITIVE Sensitive     * KLEBSIELLA PNEUMONIAE  Blood Culture ID Panel (Reflexed)      Status: Abnormal   Collection Time: 01/29/21  5:30 PM  Result Value Ref Range Status   Enterococcus faecalis NOT DETECTED NOT DETECTED Final   Enterococcus Faecium NOT DETECTED NOT DETECTED Final   Listeria monocytogenes NOT DETECTED NOT DETECTED Final   Staphylococcus species NOT DETECTED NOT DETECTED Final   Staphylococcus aureus (BCID) NOT DETECTED NOT DETECTED Final   Staphylococcus epidermidis NOT DETECTED NOT DETECTED Final   Staphylococcus lugdunensis NOT DETECTED NOT DETECTED Final   Streptococcus species NOT DETECTED NOT DETECTED Final   Streptococcus agalactiae NOT DETECTED NOT DETECTED Final   Streptococcus pneumoniae NOT DETECTED NOT DETECTED Final   Streptococcus pyogenes NOT DETECTED NOT DETECTED Final   A.calcoaceticus-baumannii NOT DETECTED NOT DETECTED Final   Bacteroides fragilis NOT DETECTED NOT DETECTED Final   Enterobacterales DETECTED (A) NOT DETECTED Final    Comment: Enterobacterales represent a large order of gram negative bacteria, not a single organism. CRITICAL RESULT CALLED TO, READ BACK BY AND VERIFIED WITH: EMILY SINCLAIR AT 1404 ON 01/30/21 [place] KJ    Enterobacter cloacae complex NOT DETECTED NOT DETECTED Final   Escherichia coli NOT DETECTED NOT DETECTED Final   Klebsiella aerogenes NOT DETECTED NOT DETECTED Final   Klebsiella oxytoca NOT DETECTED NOT DETECTED Final   Klebsiella pneumoniae DETECTED (A) NOT DETECTED Final    Comment: CRITICAL RESULT CALLED TO, READ BACK BY AND VERIFIED WITH: PHARMD EMILY SINCLAIR AT 1404 ON 01/30/21 BY KJ    Proteus species NOT DETECTED NOT DETECTED Final   Salmonella species NOT DETECTED NOT DETECTED Final   Serratia marcescens NOT DETECTED NOT DETECTED Final   Haemophilus influenzae NOT DETECTED NOT DETECTED Final   Neisseria meningitidis NOT DETECTED NOT DETECTED Final   Pseudomonas aeruginosa NOT DETECTED NOT DETECTED Final   Stenotrophomonas maltophilia NOT DETECTED NOT DETECTED Final   Candida albicans NOT  DETECTED NOT DETECTED Final   Candida auris NOT DETECTED NOT DETECTED Final   Candida glabrata NOT DETECTED NOT DETECTED Final   Candida krusei NOT DETECTED NOT DETECTED Final   Candida parapsilosis NOT DETECTED NOT DETECTED Final   Candida tropicalis NOT DETECTED NOT DETECTED Final   Cryptococcus neoformans/gattii NOT DETECTED NOT DETECTED Final   CTX-M ESBL NOT DETECTED NOT DETECTED Final   Carbapenem resistance IMP NOT DETECTED NOT DETECTED Final   Carbapenem resistance KPC NOT DETECTED NOT DETECTED Final   Carbapenem resistance NDM NOT DETECTED NOT DETECTED Final   Carbapenem resist OXA 48 LIKE NOT DETECTED NOT DETECTED Final   Carbapenem resistance VIM NOT DETECTED NOT DETECTED Final    Comment: Performed at [facility], [address], [place], [place] [address]  Resp Panel by RT-PCR (Flu A&B, Covid) Nasopharyngeal Swab     Status: None   Collection Time: 01/29/21  7:18 PM   Specimen: Nasopharyngeal Swab; Nasopharyngeal(NP) swabs in vial transport medium  Result  Value Ref Range Status   SARS Coronavirus 2 by RT PCR NEGATIVE NEGATIVE Final    Comment: (NOTE) SARS-CoV-2 target nucleic acids are NOT DETECTED.  The SARS-CoV-2 RNA is generally detectable in upper respiratory specimens during the acute phase of infection. The lowest concentration of SARS-CoV-2 viral copies this assay can detect is 138 copies/mL. A negative result does not preclude SARS-Cov-2 infection and should not be used as the sole basis for treatment or other patient management decisions. A negative result may occur with  improper specimen collection/handling, submission of specimen other than nasopharyngeal swab, presence of viral mutation(s) within the areas targeted by this assay, and inadequate number of viral copies(<138 copies/mL). A negative result must be combined with clinical observations, patient history, and epidemiological information. The expected result is Negative.  Fact Sheet for  Patients:  EntrepreneurPulse.com.au  Fact Sheet for Healthcare Providers:  IncredibleEmployment.be  This test is no t yet approved or cleared by the [place] FDA and  has been authorized for detection and/or diagnosis of SARS-CoV-2 by FDA under an Emergency Use Authorization (EUA). This EUA will remain  in effect (meaning this test can be used) for the duration of the COVID-19 declaration under Section 564(b)(1) of the Act, 21 U.S.C.section 360bbb-3(b)(1), unless the authorization is terminated  or revoked sooner.       Influenza A by PCR NEGATIVE NEGATIVE Final   Influenza B by PCR NEGATIVE NEGATIVE Final    Comment: (NOTE) The Xpert Xpress SARS-CoV-2/FLU/RSV plus assay is intended as an aid in the diagnosis of influenza from Nasopharyngeal swab specimens and should not be used as a sole basis for treatment. Nasal washings and aspirates are unacceptable for Xpert Xpress SARS-CoV-2/FLU/RSV testing.  Fact Sheet for Patients: EntrepreneurPulse.com.au  Fact Sheet for Healthcare Providers: IncredibleEmployment.be  This test is not yet approved or cleared by the [place] FDA and has been authorized for detection and/or diagnosis of SARS-CoV-2 by FDA under an Emergency Use Authorization (EUA). This EUA will remain in effect (meaning this test can be used) for the duration of the COVID-19 declaration under Section 564(b)(1) of the Act, 21 U.S.C. section 360bbb-3(b)(1), unless the authorization is terminated or revoked.  Performed at [facility], [address], [place], [place] [address]   MRSA PCR Screening     Status: None   Collection Time: 01/30/21  1:18 PM   Specimen: Nasal Mucosa; Nasopharyngeal  Result Value Ref Range Status   MRSA by PCR NEGATIVE NEGATIVE Final    Comment:        The GeneXpert MRSA Assay (FDA approved for NASAL specimens only), is one component of a comprehensive MRSA  colonization surveillance program. It is not intended to diagnose MRSA infection nor to guide or monitor treatment for MRSA infections. Performed at [facility], [place] [address], [place], [place] [address]   Culture, blood (Routine X 2) w Reflex to ID Panel     Status: None (Preliminary result)   Collection Time: 01/31/21  7:19 PM   Specimen: BLOOD LEFT HAND  Result Value Ref Range Status   Specimen Description BLOOD LEFT HAND  Final   Special Requests   Final    BOTTLES DRAWN AEROBIC AND ANAEROBIC Blood Culture results may not be optimal due to an inadequate volume of blood received in culture bottles   Culture   Final    NO GROWTH 4 DAYS Performed at [facility], [place] [address], [place], [place] [address]    Report Status  PENDING  Incomplete  Culture, blood (Routine X 2) w Reflex to ID Panel     Status: None (Preliminary result)   Collection Time: 01/31/21  7:19 PM   Specimen: BLOOD RIGHT HAND  Result Value Ref Range Status   Specimen Description BLOOD RIGHT HAND  Final   Special Requests   Final    BOTTLES DRAWN AEROBIC ONLY Blood Culture adequate volume   Culture   Final    NO GROWTH 4 DAYS Performed at [facility], [address], [place], [place] [address]    Report Status PENDING  Incomplete     Studies: No results found.    Flora Lipps, MD  [facility] 02/04/2021  If 7PM-7AM, please contact night-coverage

## 2021-02-04 NOTE — Progress Notes (Signed)
This chaplain followed up with spiritual care after [facility] visited on 6/5.  The Pt. is awake and willing to participate in Advance Directive education.    The chaplain listened to the Pt. share her questions and uncertainty about the role of an HCPOA. At this time, the Pt. chooses to honor the healthcare decision making ability of her four children in the order the children/grandchildren appear in emergency contacts.   The Pt. often reverts to questions about POA, money or property which the hospital will not be able to support the Pt.  The chaplain left a blank AD in the windowsill for the family to review.    The chaplain is available for F/U spiritual care as needed.

## 2021-02-05 ENCOUNTER — Inpatient Hospital Stay (HOSPITAL_COMMUNITY): Payer: Medicare HMO

## 2021-02-05 ENCOUNTER — Inpatient Hospital Stay (HOSPITAL_COMMUNITY): Payer: Medicare HMO | Admitting: Anesthesiology

## 2021-02-05 ENCOUNTER — Encounter (HOSPITAL_COMMUNITY)
Admission: EM | Disposition: A | Discharge status: Skilled Nursing Facility | Payer: Self-pay | Source: Home / Self Care | Attending: Neurosurgery

## 2021-02-05 ENCOUNTER — Encounter (HOSPITAL_COMMUNITY): Payer: Self-pay | Admitting: Internal Medicine

## 2021-02-05 DIAGNOSIS — S14124A Central cord syndrome at C4 level of cervical spinal cord, initial encounter: Secondary | ICD-10-CM | POA: Diagnosis present

## 2021-02-05 HISTORY — PX: ANTERIOR CERVICAL DECOMP/DISCECTOMY FUSION: SHX1161

## 2021-02-05 LAB — RENAL FUNCTION PANEL
Albumin: 1.7 g/dL — ABNORMAL LOW (ref 3.5–5.0)
Anion gap: 9 (ref 5–15)
BUN: 31 mg/dL — ABNORMAL HIGH (ref 8–23)
CO2: 31 mmol/L (ref 22–32)
Calcium: 7.7 mg/dL — ABNORMAL LOW (ref 8.9–10.3)
Chloride: 92 mmol/L — ABNORMAL LOW (ref 98–111)
Creatinine, Ser: 1.15 mg/dL — ABNORMAL HIGH (ref 0.44–1.00)
GFR, Estimated: 48 mL/min — ABNORMAL LOW (ref >60)
Glucose, Bld: 117 mg/dL — ABNORMAL HIGH (ref 70–99)
Phosphorus: 2.6 mg/dL (ref 2.5–4.6)
Potassium: 3.5 mmol/L (ref 3.5–5.1)
Sodium: 132 mmol/L — ABNORMAL LOW (ref 135–145)

## 2021-02-05 LAB — CBC
HCT: 22.7 % — ABNORMAL LOW (ref 36.0–46.0)
Hemoglobin: 7.8 g/dL — ABNORMAL LOW (ref 12.0–15.0)
MCH: 30.1 pg (ref 26.0–34.0)
MCHC: 34.4 g/dL (ref 30.0–36.0)
MCV: 87.6 fL (ref 80.0–100.0)
Platelets: 299 10*3/uL (ref 150–400)
RBC: 2.59 MIL/uL — ABNORMAL LOW (ref 3.87–5.11)
RDW: 14.8 % (ref 11.5–15.5)
WBC: 9.4 10*3/uL (ref 4.0–10.5)
nRBC: 0 % (ref 0.0–0.2)

## 2021-02-05 LAB — GLUCOSE, CAPILLARY
Glucose-Capillary: 108 mg/dL — ABNORMAL HIGH (ref 70–99)
Glucose-Capillary: 144 mg/dL — ABNORMAL HIGH (ref 70–99)
Glucose-Capillary: 126 mg/dL — ABNORMAL HIGH (ref 70–99)
Glucose-Capillary: 116 mg/dL — ABNORMAL HIGH (ref 70–99)
Glucose-Capillary: 132 mg/dL — ABNORMAL HIGH (ref 70–99)
Glucose-Capillary: 164 mg/dL — ABNORMAL HIGH (ref 70–99)

## 2021-02-05 LAB — CULTURE, BLOOD (ROUTINE X 2)
Culture: NO GROWTH
Culture: NO GROWTH
Special Requests: ADEQUATE

## 2021-02-05 LAB — ABO/RH: ABO/RH(D): B POS

## 2021-02-05 LAB — PREPARE RBC (CROSSMATCH)

## 2021-02-05 LAB — MAGNESIUM: Magnesium: 1.9 mg/dL (ref 1.7–2.4)

## 2021-02-05 SURGERY — ANTERIOR CERVICAL DECOMPRESSION/DISCECTOMY FUSION 1 LEVEL
Anesthesia: General | Site: Neck

## 2021-02-05 MED ORDER — LIDOCAINE-EPINEPHRINE 1 %-1:100000 IJ SOLN
INTRAMUSCULAR | Status: DC | PRN
  Administered 2021-02-05: 5 mL via INTRADERMAL

## 2021-02-05 MED ORDER — HEMOSTATIC AGENTS (NO CHARGE) OPTIME
TOPICAL | Status: DC | PRN
  Administered 2021-02-05: 1 via TOPICAL

## 2021-02-05 MED ORDER — SODIUM CHLORIDE 0.9 % IV SOLN: INTRAVENOUS | Status: DC | PRN

## 2021-02-05 MED ORDER — SUGAMMADEX SODIUM 200 MG/2ML IV SOLN
INTRAVENOUS | Status: DC | PRN
  Administered 2021-02-05: 300 mg via INTRAVENOUS

## 2021-02-05 MED ORDER — CEFAZOLIN SODIUM-DEXTROSE 2-4 GM/100ML-% IV SOLN
2.0000 g | INTRAVENOUS | Status: AC
  Administered 2021-02-05: 2 g via INTRAVENOUS
  Filled 2021-02-05: qty 100

## 2021-02-05 MED ORDER — ONDANSETRON HCL 4 MG/2ML IJ SOLN
INTRAMUSCULAR | Status: AC
  Filled 2021-02-05: qty 4

## 2021-02-05 MED ORDER — DEXAMETHASONE SODIUM PHOSPHATE 10 MG/ML IJ SOLN
INTRAMUSCULAR | Status: DC | PRN
  Administered 2021-02-05: 10 mg via INTRAVENOUS

## 2021-02-05 MED ORDER — LIDOCAINE 2% (20 MG/ML) 5 ML SYRINGE
INTRAMUSCULAR | Status: DC | PRN
  Administered 2021-02-05: 40 mg via INTRAVENOUS

## 2021-02-05 MED ORDER — PHENYLEPHRINE HCL-NACL 10-0.9 MG/250ML-% IV SOLN
INTRAVENOUS | Status: DC | PRN
  Administered 2021-02-05: 15 ug/min via INTRAVENOUS

## 2021-02-05 MED ORDER — THROMBIN 5000 UNITS EX SOLR
CUTANEOUS | Status: AC
  Filled 2021-02-05: qty 10000

## 2021-02-05 MED ORDER — ARTIFICIAL TEARS OPHTHALMIC OINT
TOPICAL_OINTMENT | OPHTHALMIC | Status: AC
  Filled 2021-02-05: qty 3.5

## 2021-02-05 MED ORDER — FENTANYL CITRATE (PF) 250 MCG/5ML IJ SOLN
INTRAMUSCULAR | Status: DC | PRN
  Administered 2021-02-05 (×2): 50 ug via INTRAVENOUS

## 2021-02-05 MED ORDER — ONDANSETRON HCL 4 MG/2ML IJ SOLN
INTRAMUSCULAR | Status: DC | PRN
  Administered 2021-02-05: 4 mg via INTRAVENOUS

## 2021-02-05 MED ORDER — EPHEDRINE 5 MG/ML INJ
INTRAVENOUS | Status: AC
  Filled 2021-02-05: qty 10

## 2021-02-05 MED ORDER — LIDOCAINE 2% (20 MG/ML) 5 ML SYRINGE
INTRAMUSCULAR | Status: AC
  Filled 2021-02-05: qty 5

## 2021-02-05 MED ORDER — DEXAMETHASONE SODIUM PHOSPHATE 10 MG/ML IJ SOLN
INTRAMUSCULAR | Status: AC
  Filled 2021-02-05: qty 1

## 2021-02-05 MED ORDER — SODIUM CHLORIDE 0.9 % IV SOLN
10.0000 mL/h | Freq: Once | INTRAVENOUS | Status: AC
  Administered 2021-02-05: 10 mL/h via INTRAVENOUS

## 2021-02-05 MED ORDER — LIDOCAINE-EPINEPHRINE 1 %-1:100000 IJ SOLN
INTRAMUSCULAR | Status: AC
  Filled 2021-02-05: qty 1

## 2021-02-05 MED ORDER — PHENYLEPHRINE 40 MCG/ML (10ML) SYRINGE FOR IV PUSH (FOR BLOOD PRESSURE SUPPORT): PREFILLED_SYRINGE | INTRAVENOUS | Status: DC | PRN

## 2021-02-05 MED ORDER — THROMBIN 5000 UNITS EX SOLR
CUTANEOUS | Status: DC | PRN
  Administered 2021-02-05: 5000 [IU] via TOPICAL

## 2021-02-05 MED ORDER — ORAL CARE MOUTH RINSE: 15.0000 mL | Freq: Once | OROMUCOSAL | Status: AC

## 2021-02-05 MED ORDER — CHLORHEXIDINE GLUCONATE CLOTH 2 % EX PADS: 6.0000 | MEDICATED_PAD | Freq: Once | CUTANEOUS | Status: DC

## 2021-02-05 MED ORDER — ROCURONIUM BROMIDE 10 MG/ML (PF) SYRINGE
PREFILLED_SYRINGE | INTRAVENOUS | Status: DC | PRN
  Administered 2021-02-05: 40 mg via INTRAVENOUS
  Administered 2021-02-05: 60 mg via INTRAVENOUS

## 2021-02-05 MED ORDER — CHLORHEXIDINE GLUCONATE 0.12 % MT SOLN
15.0000 mL | Freq: Once | OROMUCOSAL | Status: AC
  Administered 2021-02-06: 15 mL via OROMUCOSAL

## 2021-02-05 MED ORDER — 0.9 % SODIUM CHLORIDE (POUR BTL) OPTIME
TOPICAL | Status: DC | PRN
  Administered 2021-02-05: 1000 mL

## 2021-02-05 MED ORDER — PHENYLEPHRINE 40 MCG/ML (10ML) SYRINGE FOR IV PUSH (FOR BLOOD PRESSURE SUPPORT)
PREFILLED_SYRINGE | INTRAVENOUS | Status: AC
  Filled 2021-02-05: qty 20

## 2021-02-05 MED ORDER — FENTANYL CITRATE (PF) 100 MCG/2ML IJ SOLN
25.0000 ug | INTRAMUSCULAR | Status: DC | PRN
  Administered 2021-02-05 (×2): 25 ug via INTRAVENOUS

## 2021-02-05 MED ORDER — BUPIVACAINE-EPINEPHRINE 0.5% -1:200000 IJ SOLN
INTRAMUSCULAR | Status: AC
  Filled 2021-02-05: qty 1

## 2021-02-05 MED ORDER — CHLORHEXIDINE GLUCONATE 0.12 % MT SOLN
OROMUCOSAL | Status: AC
  Administered 2021-02-05: 15 mL
  Filled 2021-02-05: qty 15

## 2021-02-05 MED ORDER — ONDANSETRON HCL 4 MG/2ML IJ SOLN
INTRAMUSCULAR | Status: AC
  Filled 2021-02-05: qty 2

## 2021-02-05 MED ORDER — FENTANYL CITRATE (PF) 250 MCG/5ML IJ SOLN
INTRAMUSCULAR | Status: AC
  Filled 2021-02-05: qty 5

## 2021-02-05 MED ORDER — THROMBIN 5000 UNITS EX SOLR
OROMUCOSAL | Status: DC | PRN
  Administered 2021-02-05: 5 mL via TOPICAL

## 2021-02-05 MED ORDER — ROCURONIUM BROMIDE 10 MG/ML (PF) SYRINGE
PREFILLED_SYRINGE | INTRAVENOUS | Status: AC
  Filled 2021-02-05: qty 10

## 2021-02-05 MED ORDER — ALBUMIN HUMAN 5 % IV SOLN: INTRAVENOUS | Status: DC | PRN

## 2021-02-05 MED ORDER — PROPOFOL 10 MG/ML IV BOLUS
INTRAVENOUS | Status: DC | PRN
  Administered 2021-02-05: 70 mg via INTRAVENOUS

## 2021-02-05 MED ORDER — PHENYLEPHRINE 40 MCG/ML (10ML) SYRINGE FOR IV PUSH (FOR BLOOD PRESSURE SUPPORT)
PREFILLED_SYRINGE | INTRAVENOUS | Status: AC
  Filled 2021-02-05: qty 10

## 2021-02-05 MED ORDER — FENTANYL CITRATE (PF) 100 MCG/2ML IJ SOLN
INTRAMUSCULAR | Status: AC
  Filled 2021-02-05: qty 2

## 2021-02-05 SURGICAL SUPPLY — 51 items
ADH SKN CLS APL DERMABOND .7 (GAUZE/BANDAGES/DRESSINGS) ×1
BAND INSRT 18 STRL LF DISP RB (MISCELLANEOUS) ×2
BAND RUBBER #18 3X1/16 STRL (MISCELLANEOUS) ×6 IMPLANT
BLADE CLIPPER SURG (BLADE) IMPLANT
BUR DRUM 4.0 (BURR) ×2 IMPLANT
BUR DRUM 4.0MM (BURR) ×1
BUR MATCHSTICK NEURO 3.0 LAGG (BURR) ×3 IMPLANT
CANISTER SUCT 3000ML PPV (MISCELLANEOUS) ×3 IMPLANT
CARTRIDGE OIL MAESTRO DRILL (MISCELLANEOUS) ×1 IMPLANT
COVER WAND RF STERILE (DRAPES) ×3 IMPLANT
DECANTER SPIKE VIAL GLASS SM (MISCELLANEOUS) ×3 IMPLANT
DERMABOND ADVANCED (GAUZE/BANDAGES/DRESSINGS) ×2
DERMABOND ADVANCED .7 DNX12 (GAUZE/BANDAGES/DRESSINGS) ×1 IMPLANT
DIFFUSER DRILL AIR PNEUMATIC (MISCELLANEOUS) ×3 IMPLANT
DRAPE HALF SHEET 40X57 (DRAPES) IMPLANT
DRAPE LAPAROTOMY 100X72 PEDS (DRAPES) ×3 IMPLANT
DRAPE MICROSCOPE LEICA (MISCELLANEOUS) ×3 IMPLANT
DURAPREP 6ML APPLICATOR 50/CS (WOUND CARE) ×3 IMPLANT
ELECT COATED BLADE 2.86 ST (ELECTRODE) ×3 IMPLANT
ELECT REM PT RETURN 9FT ADLT (ELECTROSURGICAL) ×3
ELECTRODE REM PT RTRN 9FT ADLT (ELECTROSURGICAL) ×1 IMPLANT
GAUZE 4X4 16PLY RFD (DISPOSABLE) IMPLANT
GLOVE ECLIPSE 6.5 STRL STRAW (GLOVE) ×3 IMPLANT
GLOVE EXAM NITRILE XL STR (GLOVE) IMPLANT
GOWN STRL REUS W/ TWL LRG LVL3 (GOWN DISPOSABLE) ×2 IMPLANT
GOWN STRL REUS W/ TWL XL LVL3 (GOWN DISPOSABLE) IMPLANT
GOWN STRL REUS W/TWL 2XL LVL3 (GOWN DISPOSABLE) IMPLANT
GOWN STRL REUS W/TWL LRG LVL3 (GOWN DISPOSABLE) ×6
GOWN STRL REUS W/TWL XL LVL3 (GOWN DISPOSABLE)
KIT BASIN OR (CUSTOM PROCEDURE TRAY) ×3 IMPLANT
KIT TURNOVER KIT B (KITS) ×3 IMPLANT
NDL HYPO 25X1 1.5 SAFETY (NEEDLE) ×1 IMPLANT
NDL SPNL 22GX3.5 QUINCKE BK (NEEDLE) ×1 IMPLANT
NEEDLE HYPO 25X1 1.5 SAFETY (NEEDLE) ×3 IMPLANT
NEEDLE SPNL 22GX3.5 QUINCKE BK (NEEDLE) ×3 IMPLANT
NS IRRIG 1000ML POUR BTL (IV SOLUTION) ×3 IMPLANT
OIL CARTRIDGE MAESTRO DRILL (MISCELLANEOUS) ×3
PACK LAMINECTOMY NEURO (CUSTOM PROCEDURE TRAY) ×3 IMPLANT
PAD ARMBOARD 7.5X6 YLW CONV (MISCELLANEOUS) ×9 IMPLANT
PIN DISTRACTION 14MM (PIN) IMPLANT
PUTTY BONE 100 VESUVIUS 1CC (Putty) ×2 IMPLANT
SCREW BONE SELF DRILL 3.6X12 (Screw) ×4 IMPLANT
SPACER MIS COAL 12X14X7 0D (Spacer) ×2 IMPLANT
SPONGE INTESTINAL PEANUT (DISPOSABLE) ×3 IMPLANT
SPONGE SURGIFOAM ABS GEL SZ50 (HEMOSTASIS) ×3 IMPLANT
SUT VIC AB 0 CT1 27 (SUTURE)
SUT VIC AB 0 CT1 27XBRD ANTBC (SUTURE) IMPLANT
SUT VIC AB 3-0 SH 8-18 (SUTURE) ×3 IMPLANT
TOWEL GREEN STERILE (TOWEL DISPOSABLE) ×3 IMPLANT
TOWEL GREEN STERILE FF (TOWEL DISPOSABLE) ×3 IMPLANT
WATER STERILE IRR 1000ML POUR (IV SOLUTION) ×3 IMPLANT

## 2021-02-05 NOTE — Progress Notes (Signed)
BP (!) 155/77   Pulse 88   Temp 98.8 F (37.1 C) (Oral)   Resp (!) 24   Ht '5\' 2"'$  (1.575 m)   Wt 74.8 kg   SpO2 98%   BMI 30.18 kg/m  Alert, oriented x 4 Weak in both upper and lower extremities OR for cervical decompression.  BP (!) 155/77   Pulse 88   Temp 98.8 F (37.1 C) (Oral)   Resp (!) 24   Ht '5\' 2"'$  (1.575 m)   Wt 74.8 kg   SpO2 98%   BMI 30.18 kg/m  [name] has decided to undergo an anterior cervical decompression and arthrodesis for spinal cord compression at levels C3/4. Risks and benefits including but not limited to bleeding, infection, paralysis, weakness in one or both extremities, bowel and/or bladder dysfunction, fusion failure, hardware failure, need for further surgery, no relief of pain. [name] and her family understand and wish to proceed.

## 2021-02-05 NOTE — Op Note (Signed)
02/05/2021  9:50 PM  PATIENT:  [name]  85 y.o. female admitted for sepsis, and spinal cord compression, centrAl cord syndrome C4  PRE-OPERATIVE DIAGNOSIS:  Cervical Three-Four Cord Compression  POST-OPERATIVE DIAGNOSIS:  Cervical Three-Four Cord Compression  PROCEDURE:  Anterior Cervical decompression C3/4 Arthrodesis C3-4 with 12x14x35m  Coalition peek cage(globus)mis with allograft morsels(vesuvious) Anterior instrumentation(coalition) C3-4  SURGEON:   Surgeon(s): CAshok Pall MD   ASSISTANTS:none  ANESTHESIA:   general  EBL:  Total I/O In: 1450 [I.V.:1200; IV Piggyback:250] Out: 550 [Urine:500; Blood:50]  BLOOD ADMINISTERED:none  CELL SAVER GIVEN:none  COUNT:per nursing  DRAINS: none   SPECIMEN:  No Specimen  DICTATION: [name] taken to the operating room, intubated, and placed under general anesthesia without difficulty. She was positioned supine with her head in slight extension on a horseshoe headrest. The neck was prepped and draped in a sterile manner. I infiltrated 4 cc's 1/2%lidocaine/1:200,000 strength epinephrine into the planned incision starting from the midline to the medial border of the left sternocleidomastoid muscle. I opened the incision with a 10 blade and dissected sharply through soft tissue to the platysma. I dissected in the plane superior to the platysma both rostrally and caudally. I then opened the platysma in a horizontal fashion with Metzenbaum scissors, and dissected in the inferior plane rostrally and caudally. With both blunt and sharp technique I created an avascular corridor to the cervical spine. I placed a spinal needle(s) in the disc space at C3/4 . I then reflected the longus colli from C3 to C4 and placed self retaining retractors. I opened the disc space(s) at C3/4 with a 15 blade. I removed disc with curettes, Kerrison punches, and the drill. Using the drill I removed osteophytes and prepared for the decompression.  I  decompressed the spinal canal and the C4 root(s) with the drill, Kerrison punches, and the curettes. I used the microscope to aid in microdissection. I removed the posterior longitudinal ligament to fully expose and decompress the thecal sac. I exposed the roots laterally taking down the 3/4 uncovertebral joints. With the decompression complete I moved on to the arthrodesis. I used the drill to level the surfaces of C3 and 4. I removed soft tissue to prepare the disc space and the bony surfaces. I measured the space and placed an 764mintegrated cage into the disc space.  I then placed the anterior instrumentation. I placed 2 screws in the integrated plate. I locked the screws into place. Intraoperative xray showed the graft, plate, and screws to be in good position. I irrigated the wound, achieved hemostasis, and closed the wound in layers. I approximated the platysma, and the subcuticular plane with vicryl sutures. I used Dermabond for a sterile dressing.   PLAN OF CARE: Admit to inpatient   PATIENT DISPOSITION:  PACU - hemodynamically stable.   Delay start of Pharmacological VTE agent (>24hrs) due to surgical blood loss or risk of bleeding:  no

## 2021-02-05 NOTE — Anesthesia Procedure Notes (Signed)
Central Venous Catheter Insertion Performed by: Roderic Palau, MD, anesthesiologist Start/End6/03/2021 7:40 PM, 02/05/2021 7:55 PM Patient location: OR. Preanesthetic checklist: patient identified, IV checked, site marked, risks and benefits discussed, surgical consent, monitors and equipment checked, pre-op evaluation, timeout performed and anesthesia consent Position: Trendelenburg Lidocaine 1% used for infiltration and patient sedated Hand hygiene performed , maximum sterile barriers used  and Seldinger technique used Catheter size: 8 Fr Total catheter length 16. Central line was placed.Double lumen Procedure performed using ultrasound guided technique. Ultrasound Notes:anatomy identified, needle tip was noted to be adjacent to the nerve/plexus identified, no ultrasound evidence of intravascular and/or intraneural injection and image(s) printed for medical record Attempts: 2 (Attempted R [place] vein. Located vein but unable to pass wire.) Following insertion, dressing applied, line sutured and Biopatch. Post procedure assessment: blood return through all ports  Patient tolerated the procedure well with no immediate complications.

## 2021-02-05 NOTE — Anesthesia Procedure Notes (Signed)
Procedure Name: Intubation Date/Time: 02/05/2021 7:25 PM Performed by: Chasmine Lender T, CRNA Pre-anesthesia Checklist: Patient identified, Emergency Drugs available, Suction available and Patient being monitored Patient Re-evaluated:Patient Re-evaluated prior to induction Oxygen Delivery Method: Circle system utilized Preoxygenation: Pre-oxygenation with 100% oxygen Induction Type: IV induction Ventilation: Mask ventilation without difficulty Laryngoscope Size: Glidescope and 3 Grade View: Grade I Tube type: Oral Tube size: 7.0 mm Number of attempts: 1 Airway Equipment and Method: Stylet and Oral airway Placement Confirmation: ETT inserted through vocal cords under direct vision,  positive ETCO2 and breath sounds checked- equal and bilateral Secured at: 20 cm Tube secured with: Tape Dental Injury: Teeth and Oropharynx as per pre-operative assessment

## 2021-02-05 NOTE — Progress Notes (Signed)
[facility] for Infectious Disease  Date of Admission:  01/26/2021           Reason for visit: Follow up on GNR bacteremia  Current antibiotics: Keflex 6/7--present  Previous antibiotics: Ceftriaxone 6/1--present  ASSESSMENT:    1. Klebsiella pneumonia bacteremia: Secondary to a urinary source after presenting with severe sepsis/shock and fevers which have now resolved.  Blood cultures were repeated on 6/3 and remain no growth finalized. 2. Right forearm superficial phlebitis/cellulitis: Noted on exam yesterday morning at the site of her previous PIV.  No fevers or leukocytosis 3. AKI: Creatinine this morning is 1.15 which continues to show improvement 4. Cervical cord compression from spinal stenosis: Neurosurgery planning for OR today  PLAN:    . Continue cephalexin 500 mg every 8 hours . Given her right upper extremity phlebitis/cellulitis we will extend her duration of therapy through Monday 6/13.  This will more than adequately treat her GNR bacteremia but will also provide a week of superficial cellulitis treatment . Cold compresses for phlebitis . Available as needed   Principal Problem:   Cord compression [facility]) Active Problems:   Acute kidney injury superimposed on CKD ([place])   Type 2 diabetes mellitus with diabetic neuropathy, unspecified (HCC)   Acute lower UTI   Hyponatremia   Septic shock due to Klebsiella pneumoniae (HCC)   Metabolic acidosis with normal anion gap and failure of bicarbonate regeneration   Urinary retention    MEDICATIONS:    Scheduled Meds: . amiodarone  200 mg Oral Daily  . atorvastatin  20 mg Oral QPM  . cephALEXin  500 mg Oral Q8H  . Chlorhexidine Gluconate Cloth  6 each Topical Daily  . feeding supplement  237 mL Oral BID BM  . Gerhardt's butt cream   Topical BID  . insulin aspart  0-20 Units Subcutaneous Q4H  . multivitamin with minerals  1 tablet Oral Daily  . pantoprazole  40 mg Oral Daily  . polyethylene glycol  17 g  Oral Daily  . senna  2 tablet Oral Daily   Continuous Infusions: . sodium chloride 250 mL (02/01/21 1033)  . sodium chloride 0 mL (02/01/21 1245)  . lactated ringers 75 mL/hr at 02/05/21 0455   PRN Meds:.Place/Maintain arterial line **AND** sodium chloride, acetaminophen **OR** acetaminophen, diphenhydrAMINE, Gerhardt's butt cream, HYDROcodone-acetaminophen, methocarbamol, ondansetron **OR** ondansetron (ZOFRAN) IV, polyethylene glycol  SUBJECTIVE:   24 hour events:  NAEO  Still has some right arm tenderness. No fevers No chills Still has weakness Wants hair out of her face  Review of Systems  All other systems reviewed and are negative.     OBJECTIVE:   Blood pressure (!) 162/60, pulse 87, temperature 98.8 F (37.1 C), temperature source Oral, resp. rate 16, height '5\' 2"'$  (1.575 m), weight 74.8 kg, SpO2 95 %. Body mass index is 30.18 kg/m.  Physical Exam Constitutional:      General: She is not in acute distress.    Appearance: Normal appearance.  Pulmonary:     Effort: Pulmonary effort is normal. No respiratory distress.  Skin:    Findings: Erythema present.     Comments: Right UE forearm with erythema and mildly TTP.   Neurological:     General: No focal deficit present.     Mental Status: She is alert and oriented to person, place, and time.     Motor: Weakness present.  Psychiatric:        Mood and Affect: Mood normal.  Behavior: Behavior normal.      Lab Results: Lab Results  Component Value Date   WBC 9.4 02/05/2021   HGB 7.8 (L) 02/05/2021   HCT 22.7 (L) 02/05/2021   MCV 87.6 02/05/2021   PLT 299 02/05/2021    Lab Results  Component Value Date   NA 132 (L) 02/05/2021   K 3.5 02/05/2021   CO2 31 02/05/2021   GLUCOSE 117 (H) 02/05/2021   BUN 31 (H) 02/05/2021   CREATININE 1.15 (H) 02/05/2021   CALCIUM 7.7 (L) 02/05/2021   GFRNONAA 48 (L) 02/05/2021   GFRAA 48 (L) 08/17/2019    Lab Results  Component Value Date   ALT 57 (H)  02/04/2021   AST 62 (H) 02/04/2021   ALKPHOS 118 02/04/2021   BILITOT 0.9 02/04/2021       Component Value Date/Time   CRP 30.3 (H) 02/01/2021 0504       Component Value Date/Time   ESRSEDRATE 137 (H) 02/01/2021 0504     I have reviewed the micro and lab results in Epic.  Imaging: No results found.   Imaging independently reviewed in Epic.    Raynelle Highland for Infectious Disease [facility] [phone] pager 02/05/2021, 9:51 AM

## 2021-02-05 NOTE — Anesthesia Preprocedure Evaluation (Addendum)
Anesthesia Evaluation  Patient identified by MRN, date of birth, ID band Patient awake    Reviewed: Allergy & Precautions, NPO status , Patient's Chart, lab work & pertinent test results  Airway Mallampati: II  TM Distance: <3 FB Neck ROM: Limited    Dental no notable dental hx.    Pulmonary neg pulmonary ROS,    Pulmonary exam normal breath sounds clear to auscultation       Cardiovascular hypertension, Normal cardiovascular exam Rhythm:Regular Rate:Normal     Neuro/Psych weaknes in all 4 extremities Unable to ambulate x 4 days  Neuromuscular disease negative psych ROS   GI/Hepatic negative GI ROS, Neg liver ROS,   Endo/Other  diabetes, Type 2  Renal/GU Renal InsufficiencyRenal disease  negative genitourinary   Musculoskeletal negative musculoskeletal ROS (+)   Abdominal   Peds negative pediatric ROS (+)  Hematology  (+) anemia ,   Anesthesia Other Findings   Reproductive/Obstetrics negative OB ROS                           Anesthesia Physical Anesthesia Plan  ASA: IV  Anesthesia Plan: General   Post-op Pain Management:    Induction: Intravenous  PONV Risk Score and Plan: 3 and Ondansetron and Dexamethasone  Airway Management Planned: Oral ETT and Video Laryngoscope Planned  Additional Equipment:   Intra-op Plan:   Post-operative Plan: Extubation in OR  Informed Consent: I have reviewed the patients History and Physical, chart, labs and discussed the procedure including the risks, benefits and alternatives for the proposed anesthesia with the patient or authorized representative who has indicated his/her understanding and acceptance.     Dental advisory given  Plan Discussed with: CRNA and Surgeon  Anesthesia Plan Comments:        Anesthesia Quick Evaluation

## 2021-02-05 NOTE — Progress Notes (Signed)
PROGRESS NOTE  [name] [id number] DOB: 01/07/1936 DOA: 01/26/2021 PCP: Curlene Labrum, MD   LOS: 7 days   Brief narrative:  85 year old female with past medical history of diabetes mellitus type 2, hypertension, CKD stage IIIb, neuropathy had presented on 528 with all fall.  In the ED she did have extensive imaging without any acute abnormalities and left AGAINST MEDICAL ADVICE.  On reaching home, she was profoundly weak and was unable to ambulate and was brought into [facility] initially.  Further imaging was performed.  Physical therapy evaluated.  She was recommended a skilled nursing facility but remained in the ED l for few days and continued to have the extremity weakness.  She did have acute urinary retention on 01/29/2021 so MRI of the spine was performed which showed C3-C4 cord compression.  Neurosurgery was consulted.  On 01/30/2021, patient had shock and PCCM was consulted and patient was transferred to the ICU.  Patient was briefly put on vasopressors.  Infectious disease was consulted as well.  At this time, patient has been considered stable for transfer out of the ICU.  Assessment/Plan:  Principal Problem:   Cord compression [facility]) Active Problems:   Acute kidney injury superimposed on CKD ([place])   Type 2 diabetes mellitus with diabetic neuropathy, unspecified (HCC)   Acute lower UTI   Hyponatremia   Septic shock due to Klebsiella pneumoniae (HCC)   Metabolic acidosis with normal anion gap and failure of bicarbonate regeneration   Urinary retention  Cord compression from cervical stenosis.  Quadriparesis.  Neurosurgery on board.  Follow recommendations.  Bilateral lower extremities 3/5 strength.  Weakness more on the left side.  Neurosurgery planning for surgical intervention today..  Atrial fibrillation with RVR.  Was on amiodarone drip.  Subsequently converted.  Has been changed to amiodarone p.o.  Metabolic encephalopathy due to UTI.   Improving.  Septic shock secondary to Klebsiella pneumonia UTI and bacteremia..  Negative blood cultures on repeat..  Was on Rocephin IV and has been transitioned to Keflex.  End date 02/06/2021  Acute kidney injury on CKD 3b.  Improving creatinine levels.  Nephrology on board.  Lab Results  Component Value Date   CREATININE 1.15 (H) 02/05/2021   CREATININE 1.40 (H) 02/04/2021   CREATININE 1.52 (H) 02/03/2021    Hyponatremia.  Continue to monitor closely.  Asymptomatic but will closely monitor.  Hypokalemia.  Continue to replenish.  Debility, deconditioning, extremity weakness.  Will  need rehabilitation after surgical intervention.  DVT prophylaxis: SCDs Start: 01/29/21 2349 Place and maintain sequential compression device Start: 01/29/21 2346   Code Status: DNR  Family Communication:  None today.    Status is: Inpatient  Remains inpatient appropriate because:IV treatments appropriate due to intensity of illness or inability to take PO, Inpatient level of care appropriate due to severity of illness and Possible need for surgical intervention, IV antibiotics, need for rehabilitation, awaiting for surgical intervention.   Dispo: The patient is from: Home              Anticipated d/c is to: To be determined likely to rehabilitation              Patient currently is not medically stable to d/c.   Difficult to place patient No   Consultants:  Neurosurgery  Infectious disease  PCCM  Procedures:  None so far  Anti-infectives:  . Keflex 6/7>  Anti-infectives (From admission, onward)   Start     Dose/Rate Route Frequency Ordered Stop  02/04/21 0600  cephALEXin (KEFLEX) capsule 500 mg  Status:  Discontinued        500 mg Oral Every 8 hours 02/03/21 0931 02/03/21 0935   02/04/21 0600  cephALEXin (KEFLEX) 250 MG/5ML suspension 500 mg        500 mg Oral Every 8 hours 02/03/21 0935 02/10/21 2359   01/30/21 1800  cefTRIAXone (ROCEPHIN) 1 g in sodium chloride 0.9 % 100 mL  IVPB  Status:  Discontinued        1 g 200 mL/hr over 30 Minutes Intravenous Every 24 hours 01/29/21 2348 01/30/21 0826   01/30/21 1000  cefTRIAXone (ROCEPHIN) 2 g in sodium chloride 0.9 % 100 mL IVPB        2 g 200 mL/hr over 30 Minutes Intravenous Every 24 hours 01/30/21 0826 02/03/21 1658   01/29/21 1630  cefTRIAXone (ROCEPHIN) 1 g in sodium chloride 0.9 % 100 mL IVPB        1 g 200 mL/hr over 30 Minutes Intravenous  Once 01/29/21 1621 01/29/21 1855     Subjective: Patient was seen and examined at bedside.  Complains of mild discomfort in the back.  Denies any nausea vomiting fever chills.  Objective: Vitals:   02/05/21 0350 02/05/21 0813  BP: 134/88 (!) 162/60  Pulse: 84 87  Resp: 19 16  Temp: 98.7 F (37.1 C) 98.8 F (37.1 C)  SpO2: 100% 95%    Intake/Output Summary (Last 24 hours) at 02/05/2021 1115 Last data filed at 02/05/2021 0813 Gross per 24 hour  Intake 120 ml  Output 3075 ml  Net -2955 ml   Filed Weights   01/26/21 0848  Weight: 74.8 kg   Body mass index is 30.18 kg/m.   Physical Exam:  General: Base built, not in obvious distress HENT:   No scleral pallor or icterus noted. Oral mucosa is moist.  Chest:  Clear breath sounds.  Diminished breath sounds bilaterally. No crackles or wheezes.  CVS: S1 &S2 heard. No murmur.  Regular rate and rhythm. Abdomen: Soft, nontender, nondistended.  Bowel sounds are heard.   Extremities: No cyanosis, clubbing or edema.  Peripheral pulses are palpable.  Tenderness on the back Psych: Alert, awake and oriented, normal mood CNS:  No cranial nerve deficits.  Weakness of the lower extremities Skin: Warm and dry.  No rashes noted.  Data Review: I have personally reviewed the following laboratory data and studies,  CBC: Recent Labs  Lab 01/29/21 1505 01/30/21 0410 01/31/21 0905 02/01/21 0504 02/02/21 0334 02/04/21 0522 02/05/21 0456  WBC 10.9*   < > 13.1* 10.7* 9.6 9.8 9.4  NEUTROABS 9.4*  --  11.3*  --   --   --   --    HGB 9.8*   < > 8.3* 7.8* 7.7* 7.6* 7.8*  HCT 29.0*   < > 23.6* 22.3* 22.1* 22.0* 22.7*  MCV 91.2   < > 88.1 87.1 85.7 85.3 87.6  PLT 175   < > 153 132* 145* 212 299   < > = values in this interval not displayed.   Basic Metabolic Panel: Recent Labs  Lab 01/31/21 0905 01/31/21 0906 01/31/21 2358 02/01/21 0504 02/02/21 0334 02/03/21 0531 02/04/21 0522 02/05/21 0456  NA 128*   < > 122* 125* 125* 126* 127* 132*  K 5.1   < > 4.2 4.1 3.5 3.3* 3.4* 3.5  CL 102   < > 92* 93* 88* 87* 89* 92*  CO2 13*   < > 17* 22 26 27  29 31  GLUCOSE 155*   < > 242* 166* 162* 96 116* 117*  BUN 69*   < > 58* 54* 50* 42* 40* 31*  CREATININE 2.67*   < > 2.46* 2.28* 1.81* 1.52* 1.40* 1.15*  CALCIUM 7.6*   < > 7.1* 7.3* 7.1* 7.1* 7.6* 7.7*  MG 1.5*  --  1.5*  --  2.0  --  2.0 1.9  PHOS 4.5   < >  --  3.1 2.2* 3.0 3.3 2.6   < > = values in this interval not displayed.   Liver Function Tests: Recent Labs  Lab 02/01/21 0504 02/02/21 0334 02/03/21 0531 02/04/21 0522 02/05/21 0456  AST  --   --   --  62*  --   ALT  --   --   --  57*  --   ALKPHOS  --   --   --  118  --   BILITOT  --   --   --  0.9  --   PROT  --   --   --  4.8*  --   ALBUMIN 2.1* 1.9* 1.7* 1.7* 1.7*   No results for input(s): LIPASE, AMYLASE in the last 168 hours. No results for input(s): AMMONIA in the last 168 hours. Cardiac Enzymes: No results for input(s): CKTOTAL, CKMB, CKMBINDEX, TROPONINI in the last 168 hours. BNP (last 3 results) No results for input(s): BNP in the last 8760 hours.  ProBNP (last 3 results) No results for input(s): PROBNP in the last 8760 hours.  CBG: Recent Labs  Lab 02/04/21 1558 02/04/21 1930 02/04/21 2347 02/05/21 0349 02/05/21 0846  GLUCAP 243* 188* 154* 108* 144*   Recent Results (from the past 240 hour(s))  Resp Panel by RT-PCR (Flu A&B, Covid) Nasopharyngeal Swab     Status: None   Collection Time: 01/26/21  3:38 PM   Specimen: Nasopharyngeal Swab; Nasopharyngeal(NP) swabs in vial  transport medium  Result Value Ref Range Status   SARS Coronavirus 2 by RT PCR NEGATIVE NEGATIVE Final    Comment: (NOTE) SARS-CoV-2 target nucleic acids are NOT DETECTED.  The SARS-CoV-2 RNA is generally detectable in upper respiratory specimens during the acute phase of infection. The lowest concentration of SARS-CoV-2 viral copies this assay can detect is 138 copies/mL. A negative result does not preclude SARS-Cov-2 infection and should not be used as the sole basis for treatment or other patient management decisions. A negative result may occur with  improper specimen collection/handling, submission of specimen other than nasopharyngeal swab, presence of viral mutation(s) within the areas targeted by this assay, and inadequate number of viral copies(<138 copies/mL). A negative result must be combined with clinical observations, patient history, and epidemiological information. The expected result is Negative.  Fact Sheet for Patients:  EntrepreneurPulse.com.au  Fact Sheet for Healthcare Providers:  IncredibleEmployment.be  This test is no t yet approved or cleared by the [place] FDA and  has been authorized for detection and/or diagnosis of SARS-CoV-2 by FDA under an Emergency Use Authorization (EUA). This EUA will remain  in effect (meaning this test can be used) for the duration of the COVID-19 declaration under Section 564(b)(1) of the Act, 21 U.S.C.section 360bbb-3(b)(1), unless the authorization is terminated  or revoked sooner.       Influenza A by PCR NEGATIVE NEGATIVE Final   Influenza B by PCR NEGATIVE NEGATIVE Final    Comment: (NOTE) The Xpert Xpress SARS-CoV-2/FLU/RSV plus assay is intended as an aid in the diagnosis of influenza  from Nasopharyngeal swab specimens and should not be used as a sole basis for treatment. Nasal washings and aspirates are unacceptable for Xpert Xpress SARS-CoV-2/FLU/RSV testing.  Fact  Sheet for Patients: EntrepreneurPulse.com.au  Fact Sheet for Healthcare Providers: IncredibleEmployment.be  This test is not yet approved or cleared by the [place] FDA and has been authorized for detection and/or diagnosis of SARS-CoV-2 by FDA under an Emergency Use Authorization (EUA). This EUA will remain in effect (meaning this test can be used) for the duration of the COVID-19 declaration under Section 564(b)(1) of the Act, 21 U.S.C. section 360bbb-3(b)(1), unless the authorization is terminated or revoked.  Performed at [facility], [address] [place], [place] [address]   Urine Culture     Status: Abnormal   Collection Time: 01/29/21  4:21 PM   Specimen: Urine, Random  Result Value Ref Range Status   Specimen Description   Final    URINE, RANDOM Performed at [facility], [address], [place], [place] [address]    Special Requests   Final    NONE Performed at [facility], [address] [place], [place] [address]    Culture >=100,000 COLONIES/mL KLEBSIELLA PNEUMONIAE (A)  Final   Report Status 02/01/2021 FINAL  Final   Organism ID, Bacteria KLEBSIELLA PNEUMONIAE (A)  Final      Susceptibility   Klebsiella pneumoniae - MIC*    AMPICILLIN RESISTANT Resistant     CEFAZOLIN <=4 SENSITIVE Sensitive     CEFEPIME <=0.12 SENSITIVE Sensitive     CEFTRIAXONE <=0.25 SENSITIVE Sensitive     CIPROFLOXACIN <=0.25 SENSITIVE Sensitive     GENTAMICIN <=1 SENSITIVE Sensitive     IMIPENEM <=0.25 SENSITIVE Sensitive     NITROFURANTOIN 64 INTERMEDIATE Intermediate     TRIMETH/SULFA <=20 SENSITIVE Sensitive     AMPICILLIN/SULBACTAM 4 SENSITIVE Sensitive     PIP/TAZO <=4 SENSITIVE Sensitive     * >=100,000 COLONIES/mL KLEBSIELLA PNEUMONIAE  Culture, blood (Routine X 2) w Reflex to ID Panel     Status: Abnormal   Collection Time: 01/29/21  5:30 PM   Specimen: BLOOD RIGHT ARM  Result Value Ref Range Status   Specimen Description   Final     BLOOD RIGHT ARM Performed at [facility], [address] [place], [place] [address]    Special Requests   Final    BOTTLES DRAWN AEROBIC AND ANAEROBIC Blood Culture adequate volume Performed at [facility], [address], [place], [place] [address]    Culture  Setup Time   Final    GRAM NEGATIVE RODS AEROBIC AND ANAEROBIC BOTTLES Gram Stain Report Called to,Read Back By and Verified With: EVernona Rieger (PHARMACY @ [place]) 01/30/21 @ 0819 BY [place] Haw Performed at [facility], [address] [place], [place] [address]    Culture (A)  Final    KLEBSIELLA PNEUMONIAE SUSCEPTIBILITIES PERFORMED ON PREVIOUS CULTURE WITHIN THE LAST 5 DAYS. Performed at [facility], [place] [address], [place],  [address]    Report Status 02/01/2021 FINAL  Final  Culture, blood (Routine X 2) w Reflex to ID Panel     Status: Abnormal   Collection Time: 01/29/21  5:30 PM   Specimen: BLOOD LEFT ARM  Result Value Ref Range Status   Specimen Description   Final    BLOOD LEFT ARM Performed at [facility], [address] [place],  [address]    Special Requests   Final    BOTTLES DRAWN AEROBIC AND ANAEROBIC Blood Culture adequate  volume Performed at [facility], [address], [place], [place] [address]    Culture  Setup Time   Final    GRAM NEGATIVE RODS BOTH AEROBIC AND ANAEROBIC BOTTLES Gram Stain Report Called to,Read Back By and Verified With: E. SINCLAIR @ [facility] 01/30/21 @ 0819 BY S BEARD    Culture KLEBSIELLA PNEUMONIAE (A)  Final   Report Status 02/01/2021 FINAL  Final   Organism ID, Bacteria KLEBSIELLA PNEUMONIAE  Final      Susceptibility   Klebsiella pneumoniae - MIC*    AMPICILLIN RESISTANT Resistant     CEFAZOLIN <=4 SENSITIVE Sensitive     CEFEPIME <=0.12 SENSITIVE Sensitive     CEFTAZIDIME <=1 SENSITIVE Sensitive     CEFTRIAXONE <=0.25 SENSITIVE Sensitive     CIPROFLOXACIN <=0.25 SENSITIVE Sensitive     GENTAMICIN <=1 SENSITIVE Sensitive     IMIPENEM <=0.25 SENSITIVE  Sensitive     TRIMETH/SULFA <=20 SENSITIVE Sensitive     AMPICILLIN/SULBACTAM 4 SENSITIVE Sensitive     PIP/TAZO <=4 SENSITIVE Sensitive     * KLEBSIELLA PNEUMONIAE  Blood Culture ID Panel (Reflexed)     Status: Abnormal   Collection Time: 01/29/21  5:30 PM  Result Value Ref Range Status   Enterococcus faecalis NOT DETECTED NOT DETECTED Final   Enterococcus Faecium NOT DETECTED NOT DETECTED Final   Listeria monocytogenes NOT DETECTED NOT DETECTED Final   Staphylococcus species NOT DETECTED NOT DETECTED Final   Staphylococcus aureus (BCID) NOT DETECTED NOT DETECTED Final   Staphylococcus epidermidis NOT DETECTED NOT DETECTED Final   Staphylococcus lugdunensis NOT DETECTED NOT DETECTED Final   Streptococcus species NOT DETECTED NOT DETECTED Final   Streptococcus agalactiae NOT DETECTED NOT DETECTED Final   Streptococcus pneumoniae NOT DETECTED NOT DETECTED Final   Streptococcus pyogenes NOT DETECTED NOT DETECTED Final   A.calcoaceticus-baumannii NOT DETECTED NOT DETECTED Final   Bacteroides fragilis NOT DETECTED NOT DETECTED Final   Enterobacterales DETECTED (A) NOT DETECTED Final    Comment: Enterobacterales represent a large order of gram negative bacteria, not a single organism. CRITICAL RESULT CALLED TO, READ BACK BY AND VERIFIED WITH: EMILY SINCLAIR AT 1404 ON 01/30/21 [place] KJ    Enterobacter cloacae complex NOT DETECTED NOT DETECTED Final   Escherichia coli NOT DETECTED NOT DETECTED Final   Klebsiella aerogenes NOT DETECTED NOT DETECTED Final   Klebsiella oxytoca NOT DETECTED NOT DETECTED Final   Klebsiella pneumoniae DETECTED (A) NOT DETECTED Final    Comment: CRITICAL RESULT CALLED TO, READ BACK BY AND VERIFIED WITH: PHARMD EMILY SINCLAIR AT 1404 ON 01/30/21 BY KJ    Proteus species NOT DETECTED NOT DETECTED Final   Salmonella species NOT DETECTED NOT DETECTED Final   Serratia marcescens NOT DETECTED NOT DETECTED Final   Haemophilus influenzae NOT DETECTED NOT DETECTED Final    Neisseria meningitidis NOT DETECTED NOT DETECTED Final   Pseudomonas aeruginosa NOT DETECTED NOT DETECTED Final   Stenotrophomonas maltophilia NOT DETECTED NOT DETECTED Final   Candida albicans NOT DETECTED NOT DETECTED Final   Candida auris NOT DETECTED NOT DETECTED Final   Candida glabrata NOT DETECTED NOT DETECTED Final   Candida krusei NOT DETECTED NOT DETECTED Final   Candida parapsilosis NOT DETECTED NOT DETECTED Final   Candida tropicalis NOT DETECTED NOT DETECTED Final   Cryptococcus neoformans/gattii NOT DETECTED NOT DETECTED Final   CTX-M ESBL NOT DETECTED NOT DETECTED Final   Carbapenem resistance IMP NOT DETECTED NOT DETECTED Final   Carbapenem resistance KPC NOT DETECTED NOT DETECTED Final  Carbapenem resistance NDM NOT DETECTED NOT DETECTED Final   Carbapenem resist OXA 48 LIKE NOT DETECTED NOT DETECTED Final   Carbapenem resistance VIM NOT DETECTED NOT DETECTED Final    Comment: Performed at [facility], [place] [address] [place], [place] [address]  Resp Panel by RT-PCR (Flu A&B, Covid) Nasopharyngeal Swab     Status: None   Collection Time: 01/29/21  7:18 PM   Specimen: Nasopharyngeal Swab; Nasopharyngeal(NP) swabs in vial transport medium  Result Value Ref Range Status   SARS Coronavirus 2 by RT PCR NEGATIVE NEGATIVE Final    Comment: (NOTE) SARS-CoV-2 target nucleic acids are NOT DETECTED.  The SARS-CoV-2 RNA is generally detectable in upper respiratory specimens during the acute phase of infection. The lowest concentration of SARS-CoV-2 viral copies this assay can detect is 138 copies/mL. A negative result does not preclude SARS-Cov-2 infection and should not be used as the sole basis for treatment or other patient management decisions. A negative result may occur with  improper specimen collection/handling, submission of specimen other than nasopharyngeal swab, presence of viral mutation(s) within the areas targeted by this assay, and inadequate number of  viral copies(<138 copies/mL). A negative result must be combined with clinical observations, patient history, and epidemiological information. The expected result is Negative.  Fact Sheet for Patients:  EntrepreneurPulse.com.au  Fact Sheet for Healthcare Providers:  IncredibleEmployment.be  This test is no t yet approved or cleared by the [place] FDA and  has been authorized for detection and/or diagnosis of SARS-CoV-2 by FDA under an Emergency Use Authorization (EUA). This EUA will remain  in effect (meaning this test can be used) for the duration of the COVID-19 declaration under Section 564(b)(1) of the Act, 21 U.S.C.section 360bbb-3(b)(1), unless the authorization is terminated  or revoked sooner.       Influenza A by PCR NEGATIVE NEGATIVE Final   Influenza B by PCR NEGATIVE NEGATIVE Final    Comment: (NOTE) The Xpert Xpress SARS-CoV-2/FLU/RSV plus assay is intended as an aid in the diagnosis of influenza from Nasopharyngeal swab specimens and should not be used as a sole basis for treatment. Nasal washings and aspirates are unacceptable for Xpert Xpress SARS-CoV-2/FLU/RSV testing.  Fact Sheet for Patients: EntrepreneurPulse.com.au  Fact Sheet for Healthcare Providers: IncredibleEmployment.be  This test is not yet approved or cleared by the [place] FDA and has been authorized for detection and/or diagnosis of SARS-CoV-2 by FDA under an Emergency Use Authorization (EUA). This EUA will remain in effect (meaning this test can be used) for the duration of the COVID-19 declaration under Section 564(b)(1) of the Act, 21 U.S.C. section 360bbb-3(b)(1), unless the authorization is terminated or revoked.  Performed at [facility], [address] [place], [place] [address]   MRSA PCR Screening     Status: None   Collection Time: 01/30/21  1:18 PM   Specimen: Nasal Mucosa; Nasopharyngeal   Result Value Ref Range Status   MRSA by PCR NEGATIVE NEGATIVE Final    Comment:        The GeneXpert MRSA Assay (FDA approved for NASAL specimens only), is one component of a comprehensive MRSA colonization surveillance program. It is not intended to diagnose MRSA infection nor to guide or monitor treatment for MRSA infections. Performed at [facility], [place] [address], [place], [place] [address]   Culture, blood (Routine X 2) w Reflex to ID Panel     Status: None   Collection Time: 01/31/21  7:19 PM   Specimen:  BLOOD LEFT HAND  Result Value Ref Range Status   Specimen Description BLOOD LEFT HAND  Final   Special Requests   Final    BOTTLES DRAWN AEROBIC AND ANAEROBIC Blood Culture results may not be optimal due to an inadequate volume of blood received in culture bottles   Culture   Final    NO GROWTH 5 DAYS Performed at [facility], [place] [address], [place], [place] [address]    Report Status 02/05/2021 FINAL  Final  Culture, blood (Routine X 2) w Reflex to ID Panel     Status: None   Collection Time: 01/31/21  7:19 PM   Specimen: BLOOD RIGHT HAND  Result Value Ref Range Status   Specimen Description BLOOD RIGHT HAND  Final   Special Requests   Final    BOTTLES DRAWN AEROBIC ONLY Blood Culture adequate volume   Culture   Final    NO GROWTH 5 DAYS Performed at [facility], [place] [address] [place], [place] [address]    Report Status 02/05/2021 FINAL  Final  Surgical pcr screen     Status: None   Collection Time: 02/04/21  8:15 PM   Specimen: Nasal Mucosa; Nasal Swab  Result Value Ref Range Status   MRSA, PCR NEGATIVE NEGATIVE Final   Staphylococcus aureus NEGATIVE NEGATIVE Final    Comment: (NOTE) The Xpert SA Assay (FDA approved for NASAL specimens in patients 72 years of age and older), is one component of a comprehensive surveillance program. It is not intended to diagnose infection nor to guide or monitor treatment. Performed at [facility], [place] [address], [place], [place] [address]      Studies: No results found.    Flora Lipps, MD  [facility] 02/05/2021  If 7PM-7AM, please contact night-coverage

## 2021-02-05 NOTE — Transfer of Care (Signed)
Immediate Anesthesia Transfer of Care Note  Patient: [name]  Procedure(s) Performed: Cervical Three-Four  Anterior cervical decompression/discectomy/fusion (N/A Neck)  Patient Location: PACU  Anesthesia Type:General  Level of Consciousness: awake, alert  and oriented  Airway & Oxygen Therapy: Patient Spontanous Breathing and Patient connected to nasal cannula oxygen  Post-op Assessment: Report given to RN and Post -op Vital signs reviewed and stable  Post vital signs: Reviewed and stable  Last Vitals:  Vitals Value Taken Time  BP 124/62 02/05/21 2201  Temp    Pulse 80 02/05/21 2205  Resp 24 02/05/21 2205  SpO2 95 % 02/05/21 2205  Vitals shown include unvalidated device data.  Last Pain:  Vitals:   02/05/21 1811  TempSrc: Oral  PainSc:          Complications: No complications documented.

## 2021-02-05 NOTE — Anesthesia Postprocedure Evaluation (Signed)
Anesthesia Post Note  Patient: [name]  Procedure(s) Performed: Cervical Three-Four  Anterior cervical decompression/discectomy/fusion (N/A Neck)     Patient location during evaluation: PACU Anesthesia Type: General Level of consciousness: awake and alert Pain management: pain level controlled Vital Signs Assessment: post-procedure vital signs reviewed and stable Respiratory status: spontaneous breathing, nonlabored ventilation and respiratory function stable Cardiovascular status: blood pressure returned to baseline and stable Postop Assessment: no apparent nausea or vomiting Anesthetic complications: no   No complications documented.  Last Vitals:  Vitals:   02/05/21 2245 02/05/21 2300  BP: (!) 137/58 139/65  Pulse: 81 81  Resp: (!) 25 20  Temp:  37.1 C  SpO2: 92% 93%    Last Pain:  Vitals:   02/05/21 2245  TempSrc:   PainSc: 4                  Vangie Henthorn,W. EDMOND

## 2021-02-06 ENCOUNTER — Encounter (HOSPITAL_COMMUNITY): Payer: Self-pay | Admitting: Neurosurgery

## 2021-02-06 DIAGNOSIS — S14124D Central cord syndrome at C4 level of cervical spinal cord, subsequent encounter: Secondary | ICD-10-CM

## 2021-02-06 LAB — TYPE AND SCREEN
ABO/RH(D): B POS
Antibody Screen: NEGATIVE
Unit division: 0

## 2021-02-06 LAB — RENAL FUNCTION PANEL
Albumin: 2 g/dL — ABNORMAL LOW (ref 3.5–5.0)
Anion gap: 13 (ref 5–15)
BUN: 34 mg/dL — ABNORMAL HIGH (ref 8–23)
CO2: 24 mmol/L (ref 22–32)
Calcium: 7.6 mg/dL — ABNORMAL LOW (ref 8.9–10.3)
Chloride: 96 mmol/L — ABNORMAL LOW (ref 98–111)
Creatinine, Ser: 1.15 mg/dL — ABNORMAL HIGH (ref 0.44–1.00)
GFR, Estimated: 48 mL/min — ABNORMAL LOW (ref >60)
Glucose, Bld: 336 mg/dL — ABNORMAL HIGH (ref 70–99)
Phosphorus: 3.5 mg/dL (ref 2.5–4.6)
Potassium: 4.4 mmol/L (ref 3.5–5.1)
Sodium: 133 mmol/L — ABNORMAL LOW (ref 135–145)

## 2021-02-06 LAB — GLUCOSE, CAPILLARY
Glucose-Capillary: 149 mg/dL — ABNORMAL HIGH (ref 70–99)
Glucose-Capillary: 150 mg/dL — ABNORMAL HIGH (ref 70–99)
Glucose-Capillary: 232 mg/dL — ABNORMAL HIGH (ref 70–99)
Glucose-Capillary: 249 mg/dL — ABNORMAL HIGH (ref 70–99)
Glucose-Capillary: 279 mg/dL — ABNORMAL HIGH (ref 70–99)
Glucose-Capillary: 344 mg/dL — ABNORMAL HIGH (ref 70–99)

## 2021-02-06 LAB — CBC
HCT: 25.3 % — ABNORMAL LOW (ref 36.0–46.0)
Hemoglobin: 8.5 g/dL — ABNORMAL LOW (ref 12.0–15.0)
MCH: 29.5 pg (ref 26.0–34.0)
MCHC: 33.6 g/dL (ref 30.0–36.0)
MCV: 87.8 fL (ref 80.0–100.0)
Platelets: 350 10*3/uL (ref 150–400)
RBC: 2.88 MIL/uL — ABNORMAL LOW (ref 3.87–5.11)
RDW: 15.9 % — ABNORMAL HIGH (ref 11.5–15.5)
WBC: 10.1 10*3/uL (ref 4.0–10.5)
nRBC: 0 % (ref 0.0–0.2)

## 2021-02-06 LAB — BPAM RBC
Blood Product Expiration Date: 202206232359
ISSUE DATE / TIME: 202206081538
Unit Type and Rh: 7300

## 2021-02-06 LAB — MAGNESIUM: Magnesium: 1.9 mg/dL (ref 1.7–2.4)
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
## 2021-02-06 NOTE — Progress Notes (Signed)
Inpatient Diabetes Program Recommendations  AACE/ADA: New Consensus Statement on Inpatient Glycemic Control (2015)  Target Ranges:  Prepandial:   less than 140 mg/dL      Peak postprandial:   less than 180 mg/dL (1-2 hours)      Critically ill patients:  140 - 180 mg/dL   Lab Results  Component Value Date   GLUCAP 232 (H) 02/06/2021   HGBA1C 8.6 (H) 12/02/2020    Review of Glycemic Control  Diabetes history: type 2 Outpatient Diabetes medications: Lantus 30 units daily, Metformin 500 mg BID Current orders for Inpatient glycemic control: Novolog 0-20 units every 4 hours  Inpatient Diabetes Program Recommendations:   Noted that blood sugars have been greater than 200 mg/dl. Recommend starting 1/2 of home dose of Lantus: Lantus 15 units daily if blood sugars continue to be elevated.   Harvel Ricks RN BSN CDE Diabetes Coordinator Pager: [phone]  8am-5pm

## 2021-02-06 NOTE — Progress Notes (Signed)
Orthopedic Tech Progress Note Patient Details:  [name] 1935-11-20 [id number]  Ortho Devices Type of Ortho Device: Soft collar Ortho Device/Splint Location: NECK Ortho Device/Splint Interventions: Ordered, Application   Post Interventions Patient Tolerated: Well  Nazire Fruth A Lyllian Gause 02/06/2021, 6:27 PM

## 2021-02-06 NOTE — Progress Notes (Signed)
PROGRESS NOTE  [name] [id number] DOB: October 15, 1935 DOA: 01/26/2021 PCP: Curlene Labrum, MD   LOS: 8 days   Brief narrative:  85 year old female with past medical history of diabetes mellitus type 2, hypertension, CKD stage IIIb, neuropathy had presented on 528 with all fall.  In the ED she did have extensive imaging without any acute abnormalities and left AGAINST MEDICAL ADVICE.  On reaching home, she was profoundly weak and was unable to ambulate and was brought into [facility] initially.  Further imaging was performed.  Physical therapy evaluated.  She was recommended a skilled nursing facility but remained in the ED l for few days and continued to have the extremity weakness.  She did have acute urinary retention on 01/29/2021 so MRI of the spine was performed which showed C3-C4 cord compression.  Neurosurgery was consulted.  On 01/30/2021, patient had shock and PCCM was consulted and patient was transferred to the ICU.  Patient was briefly put on vasopressors.  Infectious disease was consulted as well.  Patient was then transferred out of the ICU.  At this time patient has undergone surgical intervention.  Assessment/Plan:  Principal Problem:   Cord compression [facility]) Active Problems:   Acute kidney injury superimposed on CKD ([place])   Type 2 diabetes mellitus with diabetic neuropathy, unspecified (HCC)   Acute lower UTI   Hyponatremia   Septic shock due to Klebsiella pneumoniae (HCC)   Metabolic acidosis with normal anion gap and failure of bicarbonate regeneration   Urinary retention   Central cord syndrome at C4 level of cervical spinal cord (HCC)  Cord compression from cervical stenosis.  Quadriparesis on presentation..  Neurosurgery on board and patient has underwent C3-C4 decompression on 02/05/2021.  Continue PT evaluation.  Atrial fibrillation with RVR.  Was on amiodarone drip.  Subsequently converted.  Has been changed to amiodarone p.o. latest heart rate of  81.  Metabolic encephalopathy due to UTI.  Improved after treatment  Septic shock secondary to Klebsiella pneumonia UTI and bacteremia..  Negative blood cultures and repeat blood culture.  Was on Rocephin IV and has been transitioned to Keflex.  Seen by infectious disease.  Acute kidney injury on CKD 3b.  Improving creatinine levels.  Nephrology on board.  Lab Results  Component Value Date   CREATININE 1.15 (H) 02/06/2021   CREATININE 1.15 (H) 02/05/2021   CREATININE 1.40 (H) 02/04/2021    Hyponatremia.  Continue to monitor closely.  Asymptomatic but will closely monitor.  Latest sodium of 133  Hypokalemia.  Continue to replenish.  Latest potassium of 4.4  Debility, deconditioning, extremity weakness.  Will  need rehabilitation after surgical intervention.  Physical therapy has been consulted.  DVT prophylaxis: SCDs Start: 01/29/21 2349 Place and maintain sequential compression device Start: 01/29/21 2346  Code Status: DNR  Family Communication:  None  Status is: Inpatient  Remains inpatient appropriate because:IV treatments appropriate due to intensity of illness or inability to take PO, Inpatient level of care appropriate due to severity of illness and need for rehabilitation, status post surgical intervention.   Dispo: The patient is from: Home              Anticipated d/c is to:  To be determined  likely to rehabilitation              Patient currently is not medically stable to d/c.   Difficult to place patient No   Consultants: Neurosurgery Infectious disease PCCM  Procedures: C3-C4 decompression on 02/05/2021.  Anti-infectives:  Keflex 6/7>  Anti-infectives (From admission, onward)    Start     Dose/Rate Route Frequency Ordered Stop   02/06/21 0600  ceFAZolin (ANCEF) IVPB 2g/100 mL premix        2 g 200 mL/hr over 30 Minutes Intravenous On call to O.R. 02/05/21 1331 02/05/21 1845   02/04/21 0600  cephALEXin (KEFLEX) capsule 500 mg  Status:  Discontinued         500 mg Oral Every 8 hours 02/03/21 0931 02/03/21 0935   02/04/21 0600  cephALEXin (KEFLEX) 250 MG/5ML suspension 500 mg        500 mg Oral Every 8 hours 02/03/21 0935 02/10/21 2359   01/30/21 1800  cefTRIAXone (ROCEPHIN) 1 g in sodium chloride 0.9 % 100 mL IVPB  Status:  Discontinued        1 g 200 mL/hr over 30 Minutes Intravenous Every 24 hours 01/29/21 2348 01/30/21 0826   01/30/21 1000  cefTRIAXone (ROCEPHIN) 2 g in sodium chloride 0.9 % 100 mL IVPB        2 g 200 mL/hr over 30 Minutes Intravenous Every 24 hours 01/30/21 0826 02/03/21 1658   01/29/21 1630  cefTRIAXone (ROCEPHIN) 1 g in sodium chloride 0.9 % 100 mL IVPB        1 g 200 mL/hr over 30 Minutes Intravenous  Once 01/29/21 1621 01/29/21 1855      Subjective: Today, patient was seen and examined at bedside.  Complains of mild discomfort on the back and wishes to return.  Denies any nausea vomiting shortness of breath cough or fever.  Objective: Vitals:   02/06/21 0431 02/06/21 0908  BP: (!) 144/56 (!) 158/69  Pulse:  89  Resp: 20 19  Temp: 98 F (36.7 C) 99.1 F (37.3 C)  SpO2: 94% 96%    Intake/Output Summary (Last 24 hours) at 02/06/2021 1145 Last data filed at 02/06/2021 0617 Gross per 24 hour  Intake 4818.44 ml  Output 1700 ml  Net 3118.44 ml    Filed Weights   01/26/21 0848  Weight: 74.8 kg   Body mass index is 30.18 kg/m.   Physical Exam:  General: Obese built, not in obvious distress HENT:   No scleral pallor or icterus noted. Oral mucosa is moist.  Scar on the neck from recent surgery Chest:  Clear breath sounds.  Diminished breath sounds bilaterally. No crackles or wheezes.  CVS: S1 &S2 heard. No murmur.  Regular rate and rhythm. Abdomen: Soft, nontender, nondistended.  Bowel sounds are heard.   Extremities: No cyanosis, clubbing or edema.  Peripheral pulses are palpable.  Erythema of the right forearm Psych: Alert, awake and oriented, normal mood CNS: Weakness of the lower  extremities. Skin: Warm and dry.  Erythema of the right forearm   Data Review: I have personally reviewed the following laboratory data and studies,  CBC: Recent Labs  Lab 01/31/21 0905 02/01/21 0504 02/02/21 0334 02/04/21 0522 02/05/21 0456 02/06/21 0351  WBC 13.1* 10.7* 9.6 9.8 9.4 10.1  NEUTROABS 11.3*  --   --   --   --   --   HGB 8.3* 7.8* 7.7* 7.6* 7.8* 8.5*  HCT 23.6* 22.3* 22.1* 22.0* 22.7* 25.3*  MCV 88.1 87.1 85.7 85.3 87.6 87.8  PLT 153 132* 145* 212 299 [id number]    Basic Metabolic Panel: Recent Labs  Lab 01/31/21 2358 02/01/21 0504 02/02/21 0334 02/03/21 0531 02/04/21 0522 02/05/21 0456 02/06/21 0351  NA 122*   < > 125* 126* 127* 132* 133*  K 4.2   < > 3.5 3.3* 3.4* 3.5 4.4  CL 92*   < > 88* 87* 89* 92* 96*  CO2 17*   < > '26 27 29 31 24  '$ GLUCOSE 242*   < > 162* 96 116* 117* 336*  BUN 58*   < > 50* 42* 40* 31* 34*  CREATININE 2.46*   < > 1.81* 1.52* 1.40* 1.15* 1.15*  CALCIUM 7.1*   < > 7.1* 7.1* 7.6* 7.7* 7.6*  MG 1.5*  --  2.0  --  2.0 1.9 1.9  PHOS  --    < > 2.2* 3.0 3.3 2.6 3.5   < > = values in this interval not displayed.    Liver Function Tests: Recent Labs  Lab 02/02/21 0334 02/03/21 0531 02/04/21 0522 02/05/21 0456 02/06/21 0351  AST  --   --  62*  --   --   ALT  --   --  57*  --   --   ALKPHOS  --   --  118  --   --   BILITOT  --   --  0.9  --   --   PROT  --   --  4.8*  --   --   ALBUMIN 1.9* 1.7* 1.7* 1.7* 2.0*    No results for input(s): LIPASE, AMYLASE in the last 168 hours. No results for input(s): AMMONIA in the last 168 hours. Cardiac Enzymes: No results for input(s): CKTOTAL, CKMB, CKMBINDEX, TROPONINI in the last 168 hours. BNP (last 3 results) No results for input(s): BNP in the last 8760 hours.  ProBNP (last 3 results) No results for input(s): PROBNP in the last 8760 hours.  CBG: Recent Labs  Lab 02/05/21 2201 02/06/21 0028 02/06/21 0417 02/06/21 0906 02/06/21 1142  GLUCAP 164* 279* 344* 249* 232*    Recent  Results (from the past 240 hour(s))  Urine Culture     Status: Abnormal   Collection Time: 01/29/21  4:21 PM   Specimen: Urine, Random  Result Value Ref Range Status   Specimen Description   Final    URINE, RANDOM Performed at [facility], [address] [place], [place] [address]    Special Requests   Final    NONE Performed at [facility], [address], [place], [place] [address]    Culture >=100,000 COLONIES/mL KLEBSIELLA PNEUMONIAE (A)  Final   Report Status 02/01/2021 FINAL  Final   Organism ID, Bacteria KLEBSIELLA PNEUMONIAE (A)  Final      Susceptibility   Klebsiella pneumoniae - MIC*    AMPICILLIN RESISTANT Resistant     CEFAZOLIN <=4 SENSITIVE Sensitive     CEFEPIME <=0.12 SENSITIVE Sensitive     CEFTRIAXONE <=0.25 SENSITIVE Sensitive     CIPROFLOXACIN <=0.25 SENSITIVE Sensitive     GENTAMICIN <=1 SENSITIVE Sensitive     IMIPENEM <=0.25 SENSITIVE Sensitive     NITROFURANTOIN 64 INTERMEDIATE Intermediate     TRIMETH/SULFA <=20 SENSITIVE Sensitive     AMPICILLIN/SULBACTAM 4 SENSITIVE Sensitive     PIP/TAZO <=4 SENSITIVE Sensitive     * >=100,000 COLONIES/mL KLEBSIELLA PNEUMONIAE  Culture, blood (Routine X 2) w Reflex to ID Panel     Status: Abnormal   Collection Time: 01/29/21  5:30 PM   Specimen: BLOOD RIGHT ARM  Result Value Ref Range Status   Specimen Description   Final    BLOOD RIGHT ARM Performed at [facility], [address], [place], [place] [address]    Special  Requests   Final    BOTTLES DRAWN AEROBIC AND ANAEROBIC Blood Culture adequate volume Performed at [facility], [address], [place], [place] [address]    Culture  Setup Time   Final    GRAM NEGATIVE RODS AEROBIC AND ANAEROBIC BOTTLES Gram Stain Report Called to,Read Back By and Verified With: EVernona Rieger (PHARMACY @ [facility]) 01/30/21 @ 0819 BY Bolivar Haw Performed at [facility], [address] [place], [place] [address]    Culture (A)  Final    KLEBSIELLA PNEUMONIAE SUSCEPTIBILITIES PERFORMED ON  PREVIOUS CULTURE WITHIN THE LAST 5 DAYS. Performed at [facility], [place] [address] [place], [place] [address]    Report Status 02/01/2021 FINAL  Final  Culture, blood (Routine X 2) w Reflex to ID Panel     Status: Abnormal   Collection Time: 01/29/21  5:30 PM   Specimen: BLOOD LEFT ARM  Result Value Ref Range Status   Specimen Description   Final    BLOOD LEFT ARM Performed at [facility], [address] [place], [place] [address]    Special Requests   Final    BOTTLES DRAWN AEROBIC AND ANAEROBIC Blood Culture adequate volume Performed at [facility], [address], [place],  [address]    Culture  Setup Time   Final    GRAM NEGATIVE RODS BOTH AEROBIC AND ANAEROBIC BOTTLES Gram Stain Report Called to,Read Back By and Verified With: E. SINCLAIR @ [facility] 01/30/21 @ 0819 BY S BEARD    Culture KLEBSIELLA PNEUMONIAE (A)  Final   Report Status 02/01/2021 FINAL  Final   Organism ID, Bacteria KLEBSIELLA PNEUMONIAE  Final      Susceptibility   Klebsiella pneumoniae - MIC*    AMPICILLIN RESISTANT Resistant     CEFAZOLIN <=4 SENSITIVE Sensitive     CEFEPIME <=0.12 SENSITIVE Sensitive     CEFTAZIDIME <=1 SENSITIVE Sensitive     CEFTRIAXONE <=0.25 SENSITIVE Sensitive     CIPROFLOXACIN <=0.25 SENSITIVE Sensitive     GENTAMICIN <=1 SENSITIVE Sensitive     IMIPENEM <=0.25 SENSITIVE Sensitive     TRIMETH/SULFA <=20 SENSITIVE Sensitive     AMPICILLIN/SULBACTAM 4 SENSITIVE Sensitive     PIP/TAZO <=4 SENSITIVE Sensitive     * KLEBSIELLA PNEUMONIAE  Blood Culture ID Panel (Reflexed)     Status: Abnormal   Collection Time: 01/29/21  5:30 PM  Result Value Ref Range Status   Enterococcus faecalis NOT DETECTED NOT DETECTED Final   Enterococcus Faecium NOT DETECTED NOT DETECTED Final   Listeria monocytogenes NOT DETECTED NOT DETECTED Final   Staphylococcus species NOT DETECTED NOT DETECTED Final   Staphylococcus aureus (BCID) NOT DETECTED NOT DETECTED Final   Staphylococcus  epidermidis NOT DETECTED NOT DETECTED Final   Staphylococcus lugdunensis NOT DETECTED NOT DETECTED Final   Streptococcus species NOT DETECTED NOT DETECTED Final   Streptococcus agalactiae NOT DETECTED NOT DETECTED Final   Streptococcus pneumoniae NOT DETECTED NOT DETECTED Final   Streptococcus pyogenes NOT DETECTED NOT DETECTED Final   A.calcoaceticus-baumannii NOT DETECTED NOT DETECTED Final   Bacteroides fragilis NOT DETECTED NOT DETECTED Final   Enterobacterales DETECTED (A) NOT DETECTED Final    Comment: Enterobacterales represent a large order of gram negative bacteria, not a single organism. CRITICAL RESULT CALLED TO, READ BACK BY AND VERIFIED WITH: EMILY SINCLAIR AT 1404 ON 01/30/21 [place] KJ    Enterobacter cloacae complex NOT DETECTED NOT DETECTED Final   Escherichia coli NOT DETECTED NOT DETECTED Final   Klebsiella aerogenes  NOT DETECTED NOT DETECTED Final   Klebsiella oxytoca NOT DETECTED NOT DETECTED Final   Klebsiella pneumoniae DETECTED (A) NOT DETECTED Final    Comment: CRITICAL RESULT CALLED TO, READ BACK BY AND VERIFIED WITH: PHARMD EMILY SINCLAIR AT 1404 ON 01/30/21 BY KJ    Proteus species NOT DETECTED NOT DETECTED Final   Salmonella species NOT DETECTED NOT DETECTED Final   Serratia marcescens NOT DETECTED NOT DETECTED Final   Haemophilus influenzae NOT DETECTED NOT DETECTED Final   Neisseria meningitidis NOT DETECTED NOT DETECTED Final   Pseudomonas aeruginosa NOT DETECTED NOT DETECTED Final   Stenotrophomonas maltophilia NOT DETECTED NOT DETECTED Final   Candida albicans NOT DETECTED NOT DETECTED Final   Candida auris NOT DETECTED NOT DETECTED Final   Candida glabrata NOT DETECTED NOT DETECTED Final   Candida krusei NOT DETECTED NOT DETECTED Final   Candida parapsilosis NOT DETECTED NOT DETECTED Final   Candida tropicalis NOT DETECTED NOT DETECTED Final   Cryptococcus neoformans/gattii NOT DETECTED NOT DETECTED Final   CTX-M ESBL NOT DETECTED NOT DETECTED Final    Carbapenem resistance IMP NOT DETECTED NOT DETECTED Final   Carbapenem resistance KPC NOT DETECTED NOT DETECTED Final   Carbapenem resistance NDM NOT DETECTED NOT DETECTED Final   Carbapenem resist OXA 48 LIKE NOT DETECTED NOT DETECTED Final   Carbapenem resistance VIM NOT DETECTED NOT DETECTED Final    Comment: Performed at [facility], [address], [place], [place] [address]  Resp Panel by RT-PCR (Flu A&B, Covid) Nasopharyngeal Swab     Status: None   Collection Time: 01/29/21  7:18 PM   Specimen: Nasopharyngeal Swab; Nasopharyngeal(NP) swabs in vial transport medium  Result Value Ref Range Status   SARS Coronavirus 2 by RT PCR NEGATIVE NEGATIVE Final    Comment: (NOTE) SARS-CoV-2 target nucleic acids are NOT DETECTED.  The SARS-CoV-2 RNA is generally detectable in upper respiratory specimens during the acute phase of infection. The lowest concentration of SARS-CoV-2 viral copies this assay can detect is 138 copies/mL. A negative result does not preclude SARS-Cov-2 infection and should not be used as the sole basis for treatment or other patient management decisions. A negative result may occur with  improper specimen collection/handling, submission of specimen other than nasopharyngeal swab, presence of viral mutation(s) within the areas targeted by this assay, and inadequate number of viral copies(<138 copies/mL). A negative result must be combined with clinical observations, patient history, and epidemiological information. The expected result is Negative.  Fact Sheet for Patients:  EntrepreneurPulse.com.au  Fact Sheet for Healthcare Providers:  IncredibleEmployment.be  This test is no t yet approved or cleared by the [place] FDA and  has been authorized for detection and/or diagnosis of SARS-CoV-2 by FDA under an Emergency Use Authorization (EUA). This EUA will remain  in effect (meaning this test can be used) for the  duration of the COVID-19 declaration under Section 564(b)(1) of the Act, 21 U.S.C.section 360bbb-3(b)(1), unless the authorization is terminated  or revoked sooner.       Influenza A by PCR NEGATIVE NEGATIVE Final   Influenza B by PCR NEGATIVE NEGATIVE Final    Comment: (NOTE) The Xpert Xpress SARS-CoV-2/FLU/RSV plus assay is intended as an aid in the diagnosis of influenza from Nasopharyngeal swab specimens and should not be used as a sole basis for treatment. Nasal washings and aspirates are unacceptable for Xpert Xpress SARS-CoV-2/FLU/RSV testing.  Fact Sheet for Patients: EntrepreneurPulse.com.au  Fact Sheet for Healthcare Providers: IncredibleEmployment.be  This test is not  yet approved or cleared by the [place] and has been authorized for detection and/or diagnosis of SARS-CoV-2 by FDA under an Emergency Use Authorization (EUA). This EUA will remain in effect (meaning this test can be used) for the duration of the COVID-19 declaration under Section 564(b)(1) of the Act, 21 U.S.C. section 360bbb-3(b)(1), unless the authorization is terminated or revoked.  Performed at [facility], [address], [place], [place] [address]   MRSA PCR Screening     Status: None   Collection Time: 01/30/21  1:18 PM   Specimen: Nasal Mucosa; Nasopharyngeal  Result Value Ref Range Status   MRSA by PCR NEGATIVE NEGATIVE Final    Comment:        The GeneXpert MRSA Assay (FDA approved for NASAL specimens only), is one component of a comprehensive MRSA colonization surveillance program. It is not intended to diagnose MRSA infection nor to guide or monitor treatment for MRSA infections. Performed at [facility], [place] [address] [place], [place] [address]   Culture, blood (Routine X 2) w Reflex to ID Panel     Status: None   Collection Time: 01/31/21  7:19 PM   Specimen: BLOOD LEFT HAND  Result Value Ref Range Status   Specimen  Description BLOOD LEFT HAND  Final   Special Requests   Final    BOTTLES DRAWN AEROBIC AND ANAEROBIC Blood Culture results may not be optimal due to an inadequate volume of blood received in culture bottles   Culture   Final    NO GROWTH 5 DAYS Performed at [facility], [place] [address], [place], [place] [address]    Report Status 02/05/2021 FINAL  Final  Culture, blood (Routine X 2) w Reflex to ID Panel     Status: None   Collection Time: 01/31/21  7:19 PM   Specimen: BLOOD RIGHT HAND  Result Value Ref Range Status   Specimen Description BLOOD RIGHT HAND  Final   Special Requests   Final    BOTTLES DRAWN AEROBIC ONLY Blood Culture adequate volume   Culture   Final    NO GROWTH 5 DAYS Performed at [facility], [place] [address] [place], [place] [address]    Report Status 02/05/2021 FINAL  Final  Surgical pcr screen     Status: None   Collection Time: 02/04/21  8:15 PM   Specimen: Nasal Mucosa; Nasal Swab  Result Value Ref Range Status   MRSA, PCR NEGATIVE NEGATIVE Final   Staphylococcus aureus NEGATIVE NEGATIVE Final    Comment: (NOTE) The Xpert SA Assay (FDA approved for NASAL specimens in patients 71 years of age and older), is one component of a comprehensive surveillance program. It is not intended to diagnose infection nor to guide or monitor treatment. Performed at [facility], [place] [address], [place], [place] [address]      Studies: DG Cervical Spine 2-3 Views  Result Date: 02/05/2021 CLINICAL DATA:  C3-C4 ACDF.  Preop and intraop images. EXAM: CERVICAL SPINE - 2-3 VIEW COMPARISON:  Cervical MRI 01/29/2021 FINDINGS: Four cross-table lateral views obtained in the operating room. Initial image at 8:03 p.m. demonstrates prior fusion at C4 extending caudally, obscured. Surgical instrument anterior to the soft tissues at the level of C3-C4. Image obtained did 8:22 p.m. demonstrates surgical instrument at the C2-C3 disc space. Image obtained at 8:26 p.m.  demonstrates surgical instrument localizing to C3-C4 disc space. Image obtained at 9:24 p.m. demonstrates instruments at the C3 level  with probable C3-C4 fusion hardware, partially obscured by overlapping structures. IMPRESSION: Lateral views of the cervical spine during C3-C4 fusion with surgical instruments as described above. Electronically Signed   By: Keith Rake M.D.   On: 02/05/2021 22:13   DG Chest Port 1 View  Result Date: 02/05/2021 CLINICAL DATA:  Central line placement. EXAM: PORTABLE CHEST 1 VIEW COMPARISON:  Radiograph 01/31/2021 FINDINGS: Right internal jugular central venous catheter tip overlies the mid SVC. No pneumothorax. Hazy lung base opacities likely represent combination of atelectasis and pleural fluid. Stable heart size and mediastinal contours. Vascular congestion. IMPRESSION: 1. Right internal jugular central venous catheter with tip over the mid SVC. No pneumothorax. 2. Hazy lung base opacities likely represent combination of atelectasis and pleural fluid. Vascular congestion. Electronically Signed   By: Keith Rake M.D.   On: 02/05/2021 22:20      Flora Lipps, MD  [facility] 02/06/2021  If 7PM-7AM, please contact night-coverage
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
## 2021-02-06 NOTE — Progress Notes (Signed)
Rehab Admissions Coordinator Note:  Patient was screened by Cleatrice Burke for appropriateness for an Inpatient Acute Rehab Consult per therapy recs.   At this time, we are recommending Inpatient Rehab consult. I will place order per protocol.  Cleatrice Burke RN MSN 02/06/2021, 6:37 PM  I can be reached at [phone].

## 2021-02-06 NOTE — Progress Notes (Signed)
[name] for Infectious Disease  Date of Admission:  01/26/2021           Reason for visit: Follow up on bacteremia  Current antibiotics: Keflex 6/7--present   Previous antibiotics: Ceftriaxone 6/1--present  ASSESSMENT:    Klebsiella pneumonia bacteremia: Secondary to urinary source and improved Right forearm superficial phlebitis/cellulitis: At the site of her previous PIV.  No fevers or leukocytosis and should be covered by Keflex.  MRSA PCR negative AKI: Creatinine this morning is 1.15 Cervical cord compression from spinal stenosis: Status post neurosurgery 6/8  PLAN:    Continue cefazolin through 6/13 to treat her bacteremia as well as her superficial right upper extremity cellulitis Cold compresses Available as needed   Principal Problem:   Cord compression [facility]) Active Problems:   Acute kidney injury superimposed on CKD ([place])   Type 2 diabetes mellitus with diabetic neuropathy, unspecified ([place])   Acute lower UTI   Hyponatremia   Septic shock due to Klebsiella pneumoniae ([place])   Metabolic acidosis with normal anion gap and failure of bicarbonate regeneration   Urinary retention   Central cord syndrome at C4 level of cervical spinal cord ([facility])    MEDICATIONS:    Scheduled Meds:  amiodarone  200 mg Oral Daily   atorvastatin  20 mg Oral QPM   cephALEXin  500 mg Oral Q8H   Chlorhexidine Gluconate Cloth  6 each Topical Daily   feeding supplement  237 mL Oral BID BM   fentaNYL       Gerhardt's butt cream   Topical BID   insulin aspart  0-20 Units Subcutaneous Q4H   multivitamin with minerals  1 tablet Oral Daily   pantoprazole  40 mg Oral Daily   polyethylene glycol  17 g Oral Daily   senna  2 tablet Oral Daily   Continuous Infusions:  sodium chloride 250 mL (02/01/21 1033)   sodium chloride 0 mL (02/01/21 1245)   lactated ringers 75 mL/hr at 02/06/21 0005   PRN Meds:.Place/Maintain arterial line **AND** sodium chloride, acetaminophen **OR**  acetaminophen, diphenhydrAMINE, Gerhardt's butt cream, HYDROcodone-acetaminophen, methocarbamol, ondansetron **OR** ondansetron (ZOFRAN) IV, polyethylene glycol  SUBJECTIVE:   24 hour events:  No acute events noted Status post OR with Dr. Christella Noa Afebrile No leukocytosis Stable renal function No new cultures   No new complaints.  No fevers or chills.  Reports that she thinks her right forearm looks about the same but has improved tenderness.  Review of Systems  All other systems reviewed and are negative.    OBJECTIVE:   Blood pressure (!) 158/69, pulse 89, temperature 99.1 F (37.3 C), temperature source Oral, resp. rate 19, height '5\' 2"'$  (1.575 m), weight 74.8 kg, SpO2 96 %. Body mass index is 30.18 kg/m.  Physical Exam Constitutional:      General: She is not in acute distress.    Appearance: Normal appearance.  HENT:     Head: Normocephalic and atraumatic.  Pulmonary:     Effort: Pulmonary effort is normal. No respiratory distress.  Musculoskeletal:        General: Swelling present.  Skin:    Findings: Erythema present.     Comments: Erythema of the right forearm appears similar with swelling and induration.  Improved tenderness to palpation.  Neurological:     General: No focal deficit present.     Mental Status: She is alert and oriented to person, place, and time.     Motor: Weakness present.  Psychiatric:  Mood and Affect: Mood normal.        Behavior: Behavior normal.     Lab Results: Lab Results  Component Value Date   WBC 10.1 02/06/2021   HGB 8.5 (L) 02/06/2021   HCT 25.3 (L) 02/06/2021   MCV 87.8 02/06/2021   PLT 350 02/06/2021    Lab Results  Component Value Date   NA 133 (L) 02/06/2021   K 4.4 02/06/2021   CO2 24 02/06/2021   GLUCOSE 336 (H) 02/06/2021   BUN 34 (H) 02/06/2021   CREATININE 1.15 (H) 02/06/2021   CALCIUM 7.6 (L) 02/06/2021   GFRNONAA 48 (L) 02/06/2021   GFRAA 48 (L) 08/17/2019    Lab Results  Component Value Date    ALT 57 (H) 02/04/2021   AST 62 (H) 02/04/2021   ALKPHOS 118 02/04/2021   BILITOT 0.9 02/04/2021       Component Value Date/Time   CRP 30.3 (H) 02/01/2021 0504       Component Value Date/Time   ESRSEDRATE 137 (H) 02/01/2021 0504     I have reviewed the micro and lab results in Epic.  Imaging: DG Cervical Spine 2-3 Views  Result Date: 02/05/2021 CLINICAL DATA:  C3-C4 ACDF.  Preop and intraop images. EXAM: CERVICAL SPINE - 2-3 VIEW COMPARISON:  Cervical MRI 01/29/2021 FINDINGS: Four cross-table lateral views obtained in the operating room. Initial image at 8:03 p.m. demonstrates prior fusion at C4 extending caudally, obscured. Surgical instrument anterior to the soft tissues at the level of C3-C4. Image obtained did 8:22 p.m. demonstrates surgical instrument at the C2-C3 disc space. Image obtained at 8:26 p.m. demonstrates surgical instrument localizing to C3-C4 disc space. Image obtained at 9:24 p.m. demonstrates instruments at the C3 level with probable C3-C4 fusion hardware, partially obscured by overlapping structures. IMPRESSION: Lateral views of the cervical spine during C3-C4 fusion with surgical instruments as described above. Electronically Signed   By: Keith Rake M.D.   On: 02/05/2021 22:13   DG Chest Port 1 View  Result Date: 02/05/2021 CLINICAL DATA:  Central line placement. EXAM: PORTABLE CHEST 1 VIEW COMPARISON:  Radiograph 01/31/2021 FINDINGS: Right internal jugular central venous catheter tip overlies the mid SVC. No pneumothorax. Hazy lung base opacities likely represent combination of atelectasis and pleural fluid. Stable heart size and mediastinal contours. Vascular congestion. IMPRESSION: 1. Right internal jugular central venous catheter with tip over the mid SVC. No pneumothorax. 2. Hazy lung base opacities likely represent combination of atelectasis and pleural fluid. Vascular congestion. Electronically Signed   By: Keith Rake M.D.   On: 02/05/2021 22:20      Imaging independently reviewed in Epic.    Raynelle Highland for Infectious Disease [facility] [phone] pager 02/06/2021, 10:08 AM

## 2021-02-07 LAB — GLUCOSE, CAPILLARY
Glucose-Capillary: 144 mg/dL — ABNORMAL HIGH (ref 70–99)
Glucose-Capillary: 146 mg/dL — ABNORMAL HIGH (ref 70–99)
Glucose-Capillary: 147 mg/dL — ABNORMAL HIGH (ref 70–99)
Glucose-Capillary: 155 mg/dL — ABNORMAL HIGH (ref 70–99)
Glucose-Capillary: 157 mg/dL — ABNORMAL HIGH (ref 70–99)
Glucose-Capillary: 173 mg/dL — ABNORMAL HIGH (ref 70–99)
Glucose-Capillary: 194 mg/dL — ABNORMAL HIGH (ref 70–99)
Glucose-Capillary: 196 mg/dL — ABNORMAL HIGH (ref 70–99)

## 2021-02-07 LAB — HIV-1 RNA QUANT-NO REFLEX-BLD
HIV 1 RNA Quant: <20 copies/mL
LOG10 HIV-1 RNA: UNDETERMINED log10copy/mL

## 2021-02-07 LAB — HEPATITIS B SURFACE ANTIBODY, QUANTITATIVE: Hep B S AB Quant (Post): <3.1 m[IU]/mL — ABNORMAL LOW (ref >9.9)
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
## 2021-02-07 NOTE — Progress Notes (Signed)
Inpatient Rehab Admissions Coordinator:   I spoke with Pt.'s granddaughter at bedside and she states she is not sure family will be able to provide 24/7 support at d/c. She would like time to think it over and would like TOC to reach out to her about possibility of Pt. Going to [facility] for rehab.  Clemens Catholic, [place], [place] Admissions Coordinator  [phone] ([place]) [phone] (office)

## 2021-02-07 NOTE — TOC Progression Note (Signed)
Transition of Care [facility]) - Progression Note    Patient Details  Name: FALICIA IMRIE MRN: [id number] Date of Birth: [birth year]  Transition of Care [facility]) CM/SW [place], [place] Phone Number: 02/07/2021, 2:49 PM  Clinical Narrative:   CSW notified by rehab admissions that patient's family had questions about SNF coverage at this time. CSW reviewed chart, noting that patient only has 3 days left for SNF and then will be in copay days. CSW contacted granddaughter, Susanne Greenhouse, to relay information and reiterate the 60 day wellness period that insurance requires before days reset. Susanne Greenhouse indicated that she will have to talk the plan over with her mother and aunt, as they make decisions as a team, and figure out what they want to do at this time. CSW to continue to follow.      Barriers to Discharge: Continued Medical Work up, Ship broker, Fish farm manager and Services       Post Acute Care Choice: NA                                         Social Determinants of Health (SDOH) Interventions    Readmission Risk Interventions Readmission Risk Prevention Plan 12/04/2020  Medication Screening Complete  Transportation Screening Complete  Some recent data might be hidden

## 2021-02-07 NOTE — Progress Notes (Addendum)
PROGRESS NOTE  [name] [id number] DOB: 1936-01-01 DOA: 01/26/2021 PCP: Curlene Labrum, MD   LOS: 9 days   Brief narrative:  85 year old female with past medical history of diabetes mellitus type 2, hypertension, CKD stage IIIb, neuropathy had presented on 528 with all fall.  In the ED she did have extensive imaging without any acute abnormalities and left AGAINST MEDICAL ADVICE.  On reaching home, she was profoundly weak and was unable to ambulate and was brought into [facility] initially.  Further imaging was performed.  Physical therapy evaluated.  She was recommended a skilled nursing facility but remained in the ED l for few days and continued to have the extremity weakness.  She did have acute urinary retention on 01/29/2021 so MRI of the spine was performed which showed C3-C4 cord compression.  Neurosurgery was consulted.  On 01/30/2021, patient had shock and PCCM was consulted and patient was transferred to the ICU.  Patient was briefly put on vasopressors.  Infectious disease was consulted as well.  Patient was then transferred out of the ICU.  At this time, patient has undergone surgical intervention and is awaiting for CIR.Marland Kitchen  Assessment/Plan:  Principal Problem:   Cord compression [facility]) Active Problems:   Acute kidney injury superimposed on CKD ([place])   Type 2 diabetes mellitus with diabetic neuropathy, unspecified (HCC)   Acute lower UTI   Hyponatremia   Septic shock due to Klebsiella pneumoniae (HCC)   Metabolic acidosis with normal anion gap and failure of bicarbonate regeneration   Urinary retention   Central cord syndrome at C4 level of cervical spinal cord (HCC)  Cord compression from cervical stenosis.  Quadriparesis on presentation. Neurosurgery on board and patient has underwent C3-C4 decompression on 02/05/2021.  Continue physical therapy.  Will need CIR   Atrial fibrillation with RVR.  Was on amiodarone drip.  Subsequently converted.  Has been changed  to amiodarone p.o. controlled  Metabolic encephalopathy due to UTI.  Improved after treatment  Septic shock secondary to Klebsiella pneumonia UTI and bacteremia..  Negative blood cultures on repeat as well.   Was initially on IV Rocephin.  On oral Keflex.  Will discontinue central line since no longer needed.  Acute kidney injury on CKD 3b.  Improving creatinine levels.  Initially seen by nephrology.  Lab Results  Component Value Date   CREATININE 1.15 (H) 02/06/2021   CREATININE 1.15 (H) 02/05/2021   CREATININE 1.40 (H) 02/04/2021    Hyponatremia.  Mild.  Hypokalemia.  Continue to replenish.  Latest potassium of 4.4  Debility, deconditioning, extremity weakness.  Awaiting for CIR.  DVT prophylaxis: SCDs Start: 01/29/21 2349 Place and maintain sequential compression device Start: 01/29/21 2346  Code Status: DNR  Family Communication:  spoke with family at bedside.  Status is: Inpatient  Remains inpatient appropriate because:IV treatments appropriate due to intensity of illness or inability to take PO, Inpatient level of care appropriate due to severity of illness and need for rehabilitation, status post surgical intervention.   Dispo: The patient is from: Home              Anticipated d/c is to: CIR.              Patient currently is medically stable to d/c.   Difficult to place patient No   Consultants: Neurosurgery Infectious disease PCCM  Procedures: C3-C4 decompression on 02/05/2021.  Anti-infectives:  Keflex 6/7>  Anti-infectives (From admission, onward)    Start     Dose/Rate Route  Frequency Ordered Stop   02/06/21 0600  ceFAZolin (ANCEF) IVPB 2g/100 mL premix        2 g 200 mL/hr over 30 Minutes Intravenous On call to O.R. 02/05/21 1331 02/05/21 1845   02/04/21 0600  cephALEXin (KEFLEX) capsule 500 mg  Status:  Discontinued        500 mg Oral Every 8 hours 02/03/21 0931 02/03/21 0935   02/04/21 0600  cephALEXin (KEFLEX) 250 MG/5ML suspension 500 mg         500 mg Oral Every 8 hours 02/03/21 0935 02/10/21 2359   01/30/21 1800  cefTRIAXone (ROCEPHIN) 1 g in sodium chloride 0.9 % 100 mL IVPB  Status:  Discontinued        1 g 200 mL/hr over 30 Minutes Intravenous Every 24 hours 01/29/21 2348 01/30/21 0826   01/30/21 1000  cefTRIAXone (ROCEPHIN) 2 g in sodium chloride 0.9 % 100 mL IVPB        2 g 200 mL/hr over 30 Minutes Intravenous Every 24 hours 01/30/21 0826 02/03/21 1658   01/29/21 1630  cefTRIAXone (ROCEPHIN) 1 g in sodium chloride 0.9 % 100 mL IVPB        1 g 200 mL/hr over 30 Minutes Intravenous  Once 01/29/21 1621 01/29/21 1855      Subjective: Today, patient was seen and examined at bedside.  Family at bedside as well.  Complains of mild discomfort in the back and wishes to be turned.  Denies any shortness of breath, cough, fever or chills.  Objective: Vitals:   02/07/21 0500 02/07/21 0804  BP:  (!) 168/97  Pulse:  91  Resp: 18 18  Temp:  98.5 F (36.9 C)  SpO2:  96%    Intake/Output Summary (Last 24 hours) at 02/07/2021 1151 Last data filed at 02/07/2021 1008 Gross per 24 hour  Intake 2066.19 ml  Output 1715 ml  Net 351.19 ml    Filed Weights   01/26/21 0848  Weight: 74.8 kg   Body mass index is 30.18 kg/m.   Physical Exam:  General: Obese built, not in obvious distress HENT:   No scleral pallor or icterus noted. Oral mucosa is moist.  Scar on the neck from recent surgery Chest:  Clear breath sounds.  Diminished breath sounds bilaterally. No crackles or wheezes.  CVS: S1 &S2 heard. No murmur.  Regular rate and rhythm. Abdomen: Soft, nontender, nondistended.  Bowel sounds are heard.   Extremities: No cyanosis, clubbing or edema.  Peripheral pulses are palpable.  Erythema of the right forearm with slough to skin. Psych: Alert, awake and oriented, normal mood CNS: Weakness of the lower extremities. Skin: Warm and dry.  Erythema of the right forearm with some skin slough   Data Review: I have personally  reviewed the following laboratory data and studies,  CBC: Recent Labs  Lab 02/01/21 0504 02/02/21 0334 02/04/21 0522 02/05/21 0456 02/06/21 0351  WBC 10.7* 9.6 9.8 9.4 10.1  HGB 7.8* 7.7* 7.6* 7.8* 8.5*  HCT 22.3* 22.1* 22.0* 22.7* 25.3*  MCV 87.1 85.7 85.3 87.6 87.8  PLT 132* 145* 212 299 [id number]    Basic Metabolic Panel: Recent Labs  Lab 01/31/21 2358 02/01/21 0504 02/02/21 0334 02/03/21 0531 02/04/21 0522 02/05/21 0456 02/06/21 0351  NA 122*   < > 125* 126* 127* 132* 133*  K 4.2   < > 3.5 3.3* 3.4* 3.5 4.4  CL 92*   < > 88* 87* 89* 92* 96*  CO2 17*   < > 26  $'27 29 31 24  'b$ GLUCOSE 242*   < > 162* 96 116* 117* 336*  BUN 58*   < > 50* 42* 40* 31* 34*  CREATININE 2.46*   < > 1.81* 1.52* 1.40* 1.15* 1.15*  CALCIUM 7.1*   < > 7.1* 7.1* 7.6* 7.7* 7.6*  MG 1.5*  --  2.0  --  2.0 1.9 1.9  PHOS  --    < > 2.2* 3.0 3.3 2.6 3.5   < > = values in this interval not displayed.    Liver Function Tests: Recent Labs  Lab 02/02/21 0334 02/03/21 0531 02/04/21 0522 02/05/21 0456 02/06/21 0351  AST  --   --  62*  --   --   ALT  --   --  57*  --   --   ALKPHOS  --   --  118  --   --   BILITOT  --   --  0.9  --   --   PROT  --   --  4.8*  --   --   ALBUMIN 1.9* 1.7* 1.7* 1.7* 2.0*    No results for input(s): LIPASE, AMYLASE in the last 168 hours. No results for input(s): AMMONIA in the last 168 hours. Cardiac Enzymes: No results for input(s): CKTOTAL, CKMB, CKMBINDEX, TROPONINI in the last 168 hours. BNP (last 3 results) No results for input(s): BNP in the last 8760 hours.  ProBNP (last 3 results) No results for input(s): PROBNP in the last 8760 hours.  CBG: Recent Labs  Lab 02/06/21 1806 02/06/21 2032 02/07/21 0133 02/07/21 0434 02/07/21 0800  GLUCAP 150* 149* 147* 155* 144*    Recent Results (from the past 240 hour(s))  Urine Culture     Status: Abnormal   Collection Time: 01/29/21  4:21 PM   Specimen: Urine, Random  Result Value Ref Range Status   Specimen  Description   Final    URINE, RANDOM Performed at [facility], [address], [place], [place] [address]    Special Requests   Final    NONE Performed at [facility], [address], [place], [place] [address]    Culture >=100,000 COLONIES/mL KLEBSIELLA PNEUMONIAE (A)  Final   Report Status 02/01/2021 FINAL  Final   Organism ID, Bacteria KLEBSIELLA PNEUMONIAE (A)  Final      Susceptibility   Klebsiella pneumoniae - MIC*    AMPICILLIN RESISTANT Resistant     CEFAZOLIN <=4 SENSITIVE Sensitive     CEFEPIME <=0.12 SENSITIVE Sensitive     CEFTRIAXONE <=0.25 SENSITIVE Sensitive     CIPROFLOXACIN <=0.25 SENSITIVE Sensitive     GENTAMICIN <=1 SENSITIVE Sensitive     IMIPENEM <=0.25 SENSITIVE Sensitive     NITROFURANTOIN 64 INTERMEDIATE Intermediate     TRIMETH/SULFA <=20 SENSITIVE Sensitive     AMPICILLIN/SULBACTAM 4 SENSITIVE Sensitive     PIP/TAZO <=4 SENSITIVE Sensitive     * >=100,000 COLONIES/mL KLEBSIELLA PNEUMONIAE  Culture, blood (Routine X 2) w Reflex to ID Panel     Status: Abnormal   Collection Time: 01/29/21  5:30 PM   Specimen: BLOOD RIGHT ARM  Result Value Ref Range Status   Specimen Description   Final    BLOOD RIGHT ARM Performed at [facility], [address], [place], [place] [address]    Special Requests   Final    BOTTLES DRAWN AEROBIC AND ANAEROBIC Blood Culture adequate volume Performed at [facility], [address] [place], [place] [address]    Culture  Setup Time   Final    GRAM NEGATIVE RODS AEROBIC AND ANAEROBIC BOTTLES Gram Stain Report Called to,Read Back By and Verified With: E. SINCLAIR (PHARMACY @ [place]) 01/30/21 @ 0819 BY S BEARD Performed at [facility], [address], [place], [place] [address]    Culture (A)  Final    KLEBSIELLA PNEUMONIAE SUSCEPTIBILITIES PERFORMED ON PREVIOUS CULTURE WITHIN THE LAST 5 DAYS. Performed at [facility], [place] [address] [place], [place] [address]    Report Status 02/01/2021 FINAL  Final  Culture, blood  (Routine X 2) w Reflex to ID Panel     Status: Abnormal   Collection Time: 01/29/21  5:30 PM   Specimen: BLOOD LEFT ARM  Result Value Ref Range Status   Specimen Description   Final    BLOOD LEFT ARM Performed at [facility], [address], [place], [place] [address]    Special Requests   Final    BOTTLES DRAWN AEROBIC AND ANAEROBIC Blood Culture adequate volume Performed at [facility], [address], [place], [place] [address]    Culture  Setup Time   Final    GRAM NEGATIVE RODS BOTH AEROBIC AND ANAEROBIC BOTTLES Gram Stain Report Called to,Read Back By and Verified With: E. SINCLAIR @ [facility] 01/30/21 @ 0819 BY S BEARD    Culture KLEBSIELLA PNEUMONIAE (A)  Final   Report Status 02/01/2021 FINAL  Final   Organism ID, Bacteria KLEBSIELLA PNEUMONIAE  Final      Susceptibility   Klebsiella pneumoniae - MIC*    AMPICILLIN RESISTANT Resistant     CEFAZOLIN <=4 SENSITIVE Sensitive     CEFEPIME <=0.12 SENSITIVE Sensitive     CEFTAZIDIME <=1 SENSITIVE Sensitive     CEFTRIAXONE <=0.25 SENSITIVE Sensitive     CIPROFLOXACIN <=0.25 SENSITIVE Sensitive     GENTAMICIN <=1 SENSITIVE Sensitive     IMIPENEM <=0.25 SENSITIVE Sensitive     TRIMETH/SULFA <=20 SENSITIVE Sensitive     AMPICILLIN/SULBACTAM 4 SENSITIVE Sensitive     PIP/TAZO <=4 SENSITIVE Sensitive     * KLEBSIELLA PNEUMONIAE  Blood Culture ID Panel (Reflexed)     Status: Abnormal   Collection Time: 01/29/21  5:30 PM  Result Value Ref Range Status   Enterococcus faecalis NOT DETECTED NOT DETECTED Final   Enterococcus Faecium NOT DETECTED NOT DETECTED Final   Listeria monocytogenes NOT DETECTED NOT DETECTED Final   Staphylococcus species NOT DETECTED NOT DETECTED Final   Staphylococcus aureus (BCID) NOT DETECTED NOT DETECTED Final   Staphylococcus epidermidis NOT DETECTED NOT DETECTED Final   Staphylococcus lugdunensis NOT DETECTED NOT DETECTED Final   Streptococcus species NOT DETECTED NOT DETECTED Final   Streptococcus  agalactiae NOT DETECTED NOT DETECTED Final   Streptococcus pneumoniae NOT DETECTED NOT DETECTED Final   Streptococcus pyogenes NOT DETECTED NOT DETECTED Final   A.calcoaceticus-baumannii NOT DETECTED NOT DETECTED Final   Bacteroides fragilis NOT DETECTED NOT DETECTED Final   Enterobacterales DETECTED (A) NOT DETECTED Final    Comment: Enterobacterales represent a large order of gram negative bacteria, not a single organism. CRITICAL RESULT CALLED TO, READ BACK BY AND VERIFIED WITH: EMILY SINCLAIR AT 1404 ON 01/30/21 [place] KJ    Enterobacter cloacae complex NOT DETECTED NOT DETECTED Final   Escherichia coli NOT DETECTED NOT DETECTED Final   Klebsiella aerogenes NOT DETECTED NOT DETECTED Final   Klebsiella oxytoca NOT DETECTED NOT DETECTED Final   Klebsiella pneumoniae DETECTED (A) NOT DETECTED Final    Comment: CRITICAL RESULT CALLED TO, READ  BACK BY AND VERIFIED WITH: PHARMD EMILY SINCLAIR AT [id number] ON 01/30/21 BY KJ    Proteus species NOT DETECTED NOT DETECTED Final   Salmonella species NOT DETECTED NOT DETECTED Final   Serratia marcescens NOT DETECTED NOT DETECTED Final   Haemophilus influenzae NOT DETECTED NOT DETECTED Final   Neisseria meningitidis NOT DETECTED NOT DETECTED Final   Pseudomonas aeruginosa NOT DETECTED NOT DETECTED Final   Stenotrophomonas maltophilia NOT DETECTED NOT DETECTED Final   Candida albicans NOT DETECTED NOT DETECTED Final   Candida auris NOT DETECTED NOT DETECTED Final   Candida glabrata NOT DETECTED NOT DETECTED Final   Candida krusei NOT DETECTED NOT DETECTED Final   Candida parapsilosis NOT DETECTED NOT DETECTED Final   Candida tropicalis NOT DETECTED NOT DETECTED Final   Cryptococcus neoformans/gattii NOT DETECTED NOT DETECTED Final   CTX-M ESBL NOT DETECTED NOT DETECTED Final   Carbapenem resistance IMP NOT DETECTED NOT DETECTED Final   Carbapenem resistance KPC NOT DETECTED NOT DETECTED Final   Carbapenem resistance NDM NOT DETECTED NOT DETECTED Final    Carbapenem resist OXA 48 LIKE NOT DETECTED NOT DETECTED Final   Carbapenem resistance VIM NOT DETECTED NOT DETECTED Final    Comment: Performed at [facility], [address] [place], [place] [address]  Resp Panel by RT-PCR (Flu A&B, Covid) Nasopharyngeal Swab     Status: None   Collection Time: 01/29/21  7:18 PM   Specimen: Nasopharyngeal Swab; Nasopharyngeal(NP) swabs in vial transport medium  Result Value Ref Range Status   SARS Coronavirus 2 by RT PCR NEGATIVE NEGATIVE Final    Comment: (NOTE) SARS-CoV-2 target nucleic acids are NOT DETECTED.  The SARS-CoV-2 RNA is generally detectable in upper respiratory specimens during the acute phase of infection. The lowest concentration of SARS-CoV-2 viral copies this assay can detect is 138 copies/mL. A negative result does not preclude SARS-Cov-2 infection and should not be used as the sole basis for treatment or other patient management decisions. A negative result may occur with  improper specimen collection/handling, submission of specimen other than nasopharyngeal swab, presence of viral mutation(s) within the areas targeted by this assay, and inadequate number of viral copies(<138 copies/mL). A negative result must be combined with clinical observations, patient history, and epidemiological information. The expected result is Negative.  Fact Sheet for Patients:  EntrepreneurPulse.com.au  Fact Sheet for Healthcare Providers:  IncredibleEmployment.be  This test is no t yet approved or cleared by the [place] FDA and  has been authorized for detection and/or diagnosis of SARS-CoV-2 by FDA under an Emergency Use Authorization (EUA). This EUA will remain  in effect (meaning this test can be used) for the duration of the COVID-19 declaration under Section 564(b)(1) of the Act, 21 U.S.C.section 360bbb-3(b)(1), unless the authorization is terminated  or revoked sooner.       Influenza A  by PCR NEGATIVE NEGATIVE Final   Influenza B by PCR NEGATIVE NEGATIVE Final    Comment: (NOTE) The Xpert Xpress SARS-CoV-2/FLU/RSV plus assay is intended as an aid in the diagnosis of influenza from Nasopharyngeal swab specimens and should not be used as a sole basis for treatment. Nasal washings and aspirates are unacceptable for Xpert Xpress SARS-CoV-2/FLU/RSV testing.  Fact Sheet for Patients: EntrepreneurPulse.com.au  Fact Sheet for Healthcare Providers: IncredibleEmployment.be  This test is not yet approved or cleared by the [place] FDA and has been authorized for detection and/or diagnosis of SARS-CoV-2 by FDA under an Emergency Use Authorization (EUA). This EUA will remain in  effect (meaning this test can be used) for the duration of the COVID-19 declaration under Section 564(b)(1) of the Act, 21 U.S.C. section 360bbb-3(b)(1), unless the authorization is terminated or revoked.  Performed at [facility], [address] [place], [place] [address]   MRSA PCR Screening     Status: None   Collection Time: 01/30/21  1:18 PM   Specimen: Nasal Mucosa; Nasopharyngeal  Result Value Ref Range Status   MRSA by PCR NEGATIVE NEGATIVE Final    Comment:        The GeneXpert MRSA Assay (FDA approved for NASAL specimens only), is one component of a comprehensive MRSA colonization surveillance program. It is not intended to diagnose MRSA infection nor to guide or monitor treatment for MRSA infections. Performed at [facility], [place] [address], [place], [place] [address]   Culture, blood (Routine X 2) w Reflex to ID Panel     Status: None   Collection Time: 01/31/21  7:19 PM   Specimen: BLOOD LEFT HAND  Result Value Ref Range Status   Specimen Description BLOOD LEFT HAND  Final   Special Requests   Final    BOTTLES DRAWN AEROBIC AND ANAEROBIC Blood Culture results may not be optimal due to an inadequate volume of blood received in  culture bottles   Culture   Final    NO GROWTH 5 DAYS Performed at [facility], [place] [address], [place], [place] [address]    Report Status 02/05/2021 FINAL  Final  Culture, blood (Routine X 2) w Reflex to ID Panel     Status: None   Collection Time: 01/31/21  7:19 PM   Specimen: BLOOD RIGHT HAND  Result Value Ref Range Status   Specimen Description BLOOD RIGHT HAND  Final   Special Requests   Final    BOTTLES DRAWN AEROBIC ONLY Blood Culture adequate volume   Culture   Final    NO GROWTH 5 DAYS Performed at [facility], [place] [address], [place], [place] [address]    Report Status 02/05/2021 FINAL  Final  Surgical pcr screen     Status: None   Collection Time: 02/04/21  8:15 PM   Specimen: Nasal Mucosa; Nasal Swab  Result Value Ref Range Status   MRSA, PCR NEGATIVE NEGATIVE Final   Staphylococcus aureus NEGATIVE NEGATIVE Final    Comment: (NOTE) The Xpert SA Assay (FDA approved for NASAL specimens in patients 18 years of age and older), is one component of a comprehensive surveillance program. It is not intended to diagnose infection nor to guide or monitor treatment. Performed at [facility], [place] [address] [place], [place] [address]      Studies: DG Cervical Spine 2-3 Views  Result Date: 02/05/2021 CLINICAL DATA:  C3-C4 ACDF.  Preop and intraop images. EXAM: CERVICAL SPINE - 2-3 VIEW COMPARISON:  Cervical MRI 01/29/2021 FINDINGS: Four cross-table lateral views obtained in the operating room. Initial image at 8:03 p.m. demonstrates prior fusion at C4 extending caudally, obscured. Surgical instrument anterior to the soft tissues at the level of C3-C4. Image obtained did 8:22 p.m. demonstrates surgical instrument at the C2-C3 disc space. Image obtained at 8:26 p.m. demonstrates surgical instrument localizing to C3-C4 disc space. Image obtained at 9:24 p.m. demonstrates instruments at the C3 level with probable C3-C4 fusion hardware, partially obscured by  overlapping structures. IMPRESSION: Lateral views of the cervical spine during C3-C4 fusion with surgical instruments as described above. Electronically Signed   By: Threasa Beards  [place] M.D.   On: 02/05/2021 22:13   DG Chest Port 1 View  Result Date: 02/05/2021 CLINICAL DATA:  Central line placement. EXAM: PORTABLE CHEST 1 VIEW COMPARISON:  Radiograph 01/31/2021 FINDINGS: Right internal jugular central venous catheter tip overlies the mid SVC. No pneumothorax. Hazy lung base opacities likely represent combination of atelectasis and pleural fluid. Stable heart size and mediastinal contours. Vascular congestion. IMPRESSION: 1. Right internal jugular central venous catheter with tip over the mid SVC. No pneumothorax. 2. Hazy lung base opacities likely represent combination of atelectasis and pleural fluid. Vascular congestion. Electronically Signed   By: Keith Rake M.D.   On: 02/05/2021 22:20      Flora Lipps, MD  [facility] 02/07/2021  If 7PM-7AM, please contact night-coverage
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
## 2021-02-07 NOTE — Progress Notes (Signed)
Inpatient Rehab Admissions Coordinator:   I met with Pt. To discuss potential CIR admit. She is interested but is not sure if she will have adequate family support at d/c. I will reach out to her daughter to see what support is available.   Clemens Catholic, [place], [place] Admissions Coordinator  [phone] ([place]) [phone] (office)

## 2021-02-07 NOTE — Progress Notes (Signed)
Daily Progress Note   Patient Name: [name]       Date: 02/07/2021 DOB: [birth year]  Age: 85 y.o. MRN#: [id number] Attending Physician: Ashok Pall, MD Primary Care Physician: Curlene Labrum, MD Admit Date: 01/26/2021  Reason for Consultation/Follow-up: Establishing goals of care  Subjective: Patient sitting up in bedside chair. Has just completed PT.  States she feels good and relaxed. Noted she is being evaluated for CIR.   Length of Stay: 9  Current Medications: Scheduled Meds:  . amiodarone  200 mg Oral Daily  . atorvastatin  20 mg Oral QPM  . cephALEXin  500 mg Oral Q8H  . Chlorhexidine Gluconate Cloth  6 each Topical Daily  . feeding supplement  237 mL Oral BID BM  . Gerhardt's butt cream   Topical BID  . insulin aspart  0-20 Units Subcutaneous Q4H  . multivitamin with minerals  1 tablet Oral Daily  . pantoprazole  40 mg Oral Daily  . polyethylene glycol  17 g Oral Daily  . senna  2 tablet Oral Daily    Continuous Infusions: . sodium chloride 250 mL (02/01/21 1033)  . sodium chloride 0 mL (02/01/21 1245)  . lactated ringers 75 mL/hr at 02/06/21 0005    PRN Meds: Place/Maintain arterial line **AND** sodium chloride, acetaminophen **OR** acetaminophen, diphenhydrAMINE, Gerhardt's butt cream, HYDROcodone-acetaminophen, methocarbamol, ondansetron **OR** ondansetron (ZOFRAN) IV, polyethylene glycol  Physical Exam Vitals and nursing note reviewed.  HENT:     Mouth/Throat:     Comments: Healing ulceration on top lip Cardiovascular:     Rate and Rhythm: Normal rate and regular rhythm.     Pulses: Normal pulses.  Pulmonary:     Effort: Pulmonary effort is normal.  Skin:    Comments: R arm with area of redness, healing ulcers  Neurological:     Mental  Status: She is alert.            Vital Signs: BP (!) 168/97 (BP Location: Left Arm)   Pulse 91   Temp 98.5 F (36.9 C) (Oral)   Resp 18   Ht '5\' 2"'$  (1.575 m)   Wt 74.8 kg   SpO2 96%   BMI 30.18 kg/m  SpO2: SpO2: 96 % O2 Device: O2 Device: Room Air O2 Flow Rate: O2 Flow Rate (L/min): 2 L/min  Intake/output summary:  Intake/Output Summary (Last 24 hours) at 02/07/2021 1101 Last data filed at 02/07/2021 1008 Gross per 24 hour  Intake 2066.19 ml  Output 1715 ml  Net 351.19 ml   LBM: Last BM Date: 02/04/21 Baseline Weight: Weight: 74.8 kg Most recent weight: Weight: 74.8 kg       Palliative Assessment/Data: PPS: 30%    Flowsheet Rows    Flowsheet Row Most Recent Value  Intake Tab   Referral Department Hospitalist  Unit at Time of Referral Intermediate Care Unit  Palliative Care Primary Diagnosis Trauma  Date Notified 02/01/21  Palliative Care Type New Palliative care  Reason for referral Clarify Goals of Care  Date of Admission 01/26/21  Date first seen by Palliative Care 02/02/21  # of days Palliative referral response time 1 Day(s)  # of days IP prior to Palliative referral 6  Clinical Assessment   Palliative Performance Scale Score 10%  Pain Max last 24 hours Not able to report  Pain Min Last 24 hours Not able to report  Dyspnea Max Last 24 Hours Not able to report  Dyspnea Min Last 24 hours Not able to report  Psychosocial & Spiritual Assessment   Palliative Care Outcomes        Patient Active Problem List   Diagnosis Date Noted  . Central cord syndrome at C4 level of cervical spinal cord ([place]) 02/05/2021  . Septic shock due to Klebsiella pneumoniae ([place]) 01/31/2021  . Metabolic acidosis with normal anion gap and failure of bicarbonate regeneration 01/31/2021  . Urinary retention 01/31/2021  . Cord compression ([place]) 01/29/2021  . Acute lower UTI 01/29/2021  . Hyponatremia 01/29/2021  . Closed fracture of proximal end of left fibula with routine healing  12/19/2020  . CKD (chronic kidney disease), stage III ([place]) 12/11/2020  . Hyperlipidemia associated with type 2 diabetes mellitus ([place]) 12/05/2020  . Type 2 diabetes mellitus with diabetic neuropathy, unspecified ([place]) 12/05/2020  . GERD without esophagitis 12/05/2020  . Chronic constipation 12/05/2020  . Diabetic peripheral neuropathy ([place]) 12/05/2020  . Avascular necrosis of bones of both hips ([place]) 12/05/2020  . Hypertension associated with diabetes ([place])   . Acute kidney injury superimposed on CKD ([place])   . Normocytic anemia   . Bilateral sensorineural hearing loss 05/02/2020  . Cervical spondylosis with myelopathy 01/27/2019  . Degeneration of lumbar intervertebral disc 12/12/2018    Palliative Care Assessment & Plan   Patient Profile:  85 y.o. female  with past medical history of HTN/HLD, DM, CKD3, neuropathy, obesity, history of back surgery, cervical disc surgery, appendectomy, abdominal hysterectomy, hand and knee surgery admitted on 01/26/2021 with CT 5/29 showing disc space narrowingC3-4 6/1 severe canal stenosis with cord compression, bacteremia dt klebsiella pne UTI, AKI.   Assessment/Recommendations/Plan  S/p surgery for cord compression- continuing to work with therapy- being evaluated by [employer] with daughter Peter Congo- unable to reach [facility]- they are hopeful for patient to receive inpatient rehab and then d/c to [facility]'s home with PACE in place PMT will check chart on Monday and see if decline- please call if assistance is needed before then  Goals of Care and Additional Recommendations: Limitations on Scope of Treatment: Full Scope Treatment  Code Status: DNR  Prognosis:  Unable to determine  Discharge Planning: CIR pending approval  Care plan was discussed with daughter, Peter Congo  Thank you for allowing the Palliative Medicine Team to assist in the care of this patient.   Total time: 42 mins  Greater than 50%  of this time was spent counseling and  coordinating care related to the above assessment and plan.  Mariana Kaufman, AGNP-C Palliative Medicine   Please contact Palliative Medicine Team phone at [phone] for questions and concerns.

## 2021-02-08 DIAGNOSIS — R339 Retention of urine, unspecified: Secondary | ICD-10-CM

## 2021-02-08 DIAGNOSIS — E872 Acidosis: Secondary | ICD-10-CM

## 2021-02-08 DIAGNOSIS — Z794 Long term (current) use of insulin: Secondary | ICD-10-CM

## 2021-02-08 DIAGNOSIS — E1140 Type 2 diabetes mellitus with diabetic neuropathy, unspecified: Secondary | ICD-10-CM

## 2021-02-08 LAB — CBC
HCT: 27.6 % — ABNORMAL LOW (ref 36.0–46.0)
Hemoglobin: 9.2 g/dL — ABNORMAL LOW (ref 12.0–15.0)
MCH: 29.1 pg (ref 26.0–34.0)
MCHC: 33.3 g/dL (ref 30.0–36.0)
MCV: 87.3 fL (ref 80.0–100.0)
Platelets: 433 10*3/uL — ABNORMAL HIGH (ref 150–400)
RBC: 3.16 MIL/uL — ABNORMAL LOW (ref 3.87–5.11)
RDW: 15.5 % (ref 11.5–15.5)
WBC: 10.4 10*3/uL (ref 4.0–10.5)
nRBC: 0 % (ref 0.0–0.2)

## 2021-02-08 LAB — GLUCOSE, CAPILLARY
Glucose-Capillary: 143 mg/dL — ABNORMAL HIGH (ref 70–99)
Glucose-Capillary: 147 mg/dL — ABNORMAL HIGH (ref 70–99)
Glucose-Capillary: 160 mg/dL — ABNORMAL HIGH (ref 70–99)
Glucose-Capillary: 186 mg/dL — ABNORMAL HIGH (ref 70–99)
Glucose-Capillary: 186 mg/dL — ABNORMAL HIGH (ref 70–99)
Glucose-Capillary: 214 mg/dL — ABNORMAL HIGH (ref 70–99)

## 2021-02-08 LAB — MAGNESIUM: Magnesium: 1.7 mg/dL (ref 1.7–2.4)

## 2021-02-08 LAB — COMPREHENSIVE METABOLIC PANEL
ALT: 61 U/L — ABNORMAL HIGH (ref 0–44)
AST: 60 U/L — ABNORMAL HIGH (ref 15–41)
Albumin: 2.1 g/dL — ABNORMAL LOW (ref 3.5–5.0)
Alkaline Phosphatase: 111 U/L (ref 38–126)
Anion gap: 11 (ref 5–15)
BUN: 23 mg/dL (ref 8–23)
CO2: 24 mmol/L (ref 22–32)
Calcium: 8.1 mg/dL — ABNORMAL LOW (ref 8.9–10.3)
Chloride: 96 mmol/L — ABNORMAL LOW (ref 98–111)
Creatinine, Ser: 1 mg/dL (ref 0.44–1.00)
GFR, Estimated: 56 mL/min — ABNORMAL LOW (ref >60)
Glucose, Bld: 186 mg/dL — ABNORMAL HIGH (ref 70–99)
Potassium: 4.4 mmol/L (ref 3.5–5.1)
Sodium: 131 mmol/L — ABNORMAL LOW (ref 135–145)
Total Bilirubin: 0.9 mg/dL (ref 0.3–1.2)
Total Protein: 5.5 g/dL — ABNORMAL LOW (ref 6.5–8.1)

## 2021-02-08 LAB — PHOSPHORUS: Phosphorus: 3.5 mg/dL (ref 2.5–4.6)

## 2021-02-08 MED ORDER — GUAIFENESIN ER 600 MG PO TB12: 600.0000 mg | ORAL_TABLET | Freq: Two times a day (BID) | ORAL | Status: DC | PRN

## 2021-02-08 MED ORDER — ENOXAPARIN SODIUM 40 MG/0.4ML IJ SOSY
40.0000 mg | PREFILLED_SYRINGE | INTRAMUSCULAR | Status: DC
  Administered 2021-02-08 – 2021-02-12 (×5): 40 mg via SUBCUTANEOUS
  Filled 2021-02-08 (×5): qty 0.4

## 2021-02-08 MED ORDER — PHENOL 1.4 % MT LIQD: 1.0000 | OROMUCOSAL | Status: DC | PRN

## 2021-02-08 MED ORDER — DICLOFENAC SODIUM 1 % EX GEL
2.0000 g | Freq: Three times a day (TID) | CUTANEOUS | Status: DC | PRN
  Administered 2021-02-08: 2 g via TOPICAL
  Filled 2021-02-08: qty 100

## 2021-02-08 MED ORDER — ALPRAZOLAM 0.25 MG PO TABS
0.2500 mg | ORAL_TABLET | Freq: Three times a day (TID) | ORAL | Status: DC | PRN
  Administered 2021-02-08 – 2021-02-12 (×6): 0.25 mg via ORAL
  Filled 2021-02-08 (×7): qty 1

## 2021-02-08 NOTE — Progress Notes (Signed)
PROGRESS NOTE  [name] [id number] DOB: June 17, 1936 DOA: 01/26/2021 PCP: Curlene Labrum, MD   LOS: 10 days   Brief narrative:  85 year old female with past medical history of diabetes mellitus type 2, hypertension, CKD stage IIIb, neuropathy had presented on 528 with all fall.  In the ED she did have extensive imaging without any acute abnormalities and left AGAINST MEDICAL ADVICE.  On reaching home, she was profoundly weak and was unable to ambulate and was brought into [facility] initially.  Further imaging was performed.  Physical therapy evaluated.  She was recommended a skilled nursing facility but remained in the ED l for few days and continued to have the extremity weakness.  She did have acute urinary retention on 01/29/2021 so MRI of the spine was performed which showed C3-C4 cord compression.  Neurosurgery was consulted.  On 01/30/2021, patient had shock and PCCM was consulted and patient was transferred to the ICU.  Patient was briefly put on vasopressors.  Infectious disease was consulted as well.  Patient was then transferred out of the ICU.  At this time, patient has undergone surgical intervention and is awaiting for CIR.Marland Kitchen  Assessment/Plan:  Principal Problem:   Cord compression [facility]) Active Problems:   Acute kidney injury superimposed on CKD ([place])   Type 2 diabetes mellitus with diabetic neuropathy, unspecified (HCC)   Acute lower UTI   Hyponatremia   Septic shock due to Klebsiella pneumoniae (HCC)   Metabolic acidosis with normal anion gap and failure of bicarbonate regeneration   Urinary retention   Central cord syndrome at C4 level of cervical spinal cord (HCC)  Cord compression from cervical stenosis.  Quadriparesis on presentation. Neurosurgery on board and patient has underwent C3-C4 decompression on 02/05/2021.  Continue physical therapy.  Will need CIR.  Atrial fibrillation with RVR.  Was on amiodarone drip.  Subsequently has converted.  Has been  changed to amiodarone p.o, controlled  Metabolic encephalopathy due to UTI.  Improved after treatment  Septic shock secondary to Klebsiella pneumonia UTI and bacteremia..  Negative blood cultures on repeat   Was initially on IV Rocephin.  Currently on oral Keflex.  Central line was discontinued 02/07/2021  Acute kidney injury on CKD 3b.  Improving creatinine levels.  Initially seen by nephrology.  Lab Results  Component Value Date   CREATININE 1.00 02/08/2021   CREATININE 1.15 (H) 02/06/2021   CREATININE 1.15 (H) 02/05/2021    Hyponatremia.  Mild.  Latest sodium of 131  Hypokalemia.  Continue to replenish.  Latest potassium of 4.4  Debility, deconditioning, extremity weakness.  Awaiting for CIR.  DVT prophylaxis: SCDs Start: 01/29/21 2349 Place and maintain sequential compression device Start: 01/29/21 2346  Code Status: DNR  Family Communication:  I again spoke with the patient's family at bedside.  Status is: Inpatient  Remains inpatient appropriate because:IV treatments appropriate due to intensity of illness or inability to take PO, Inpatient level of care appropriate due to severity of illness and need for rehabilitation, status post surgical intervention.   Dispo: The patient is from: Home              Anticipated d/c is to: CIR.              Patient currently is medically stable to d/c.   Difficult to place patient No   Consultants: Neurosurgery Infectious disease PCCM  Procedures: C3-C4 decompression on 02/05/2021.  Anti-infectives:  Keflex 6/7> plan until 6/13  Anti-infectives (From admission, onward)  Start     Dose/Rate Route Frequency Ordered Stop   02/06/21 0600  ceFAZolin (ANCEF) IVPB 2g/100 mL premix        2 g 200 mL/hr over 30 Minutes Intravenous On call to O.R. 02/05/21 1331 02/05/21 1845   02/04/21 0600  cephALEXin (KEFLEX) capsule 500 mg  Status:  Discontinued        500 mg Oral Every 8 hours 02/03/21 0931 02/03/21 0935   02/04/21 0600   cephALEXin (KEFLEX) 250 MG/5ML suspension 500 mg        500 mg Oral Every 8 hours 02/03/21 0935 02/10/21 2359   01/30/21 1800  cefTRIAXone (ROCEPHIN) 1 g in sodium chloride 0.9 % 100 mL IVPB  Status:  Discontinued        1 g 200 mL/hr over 30 Minutes Intravenous Every 24 hours 01/29/21 2348 01/30/21 0826   01/30/21 1000  cefTRIAXone (ROCEPHIN) 2 g in sodium chloride 0.9 % 100 mL IVPB        2 g 200 mL/hr over 30 Minutes Intravenous Every 24 hours 01/30/21 0826 02/03/21 1658   01/29/21 1630  cefTRIAXone (ROCEPHIN) 1 g in sodium chloride 0.9 % 100 mL IVPB        1 g 200 mL/hr over 30 Minutes Intravenous  Once 01/29/21 1621 01/29/21 1855      Subjective: Today, patient was seen and examined at bedside.  Complains of discomfort on the back.  Feels little anxious.  Feels that her throat hurts a little bit.  Objective: Vitals:   02/08/21 0802 02/08/21 1123  BP: (!) 162/78 (!) 170/75  Pulse: 89 91  Resp: (!) 21 18  Temp: 98.3 F (36.8 C) 98.2 F (36.8 C)  SpO2: 96% 96%    Intake/Output Summary (Last 24 hours) at 02/08/2021 1332 Last data filed at 02/08/2021 0420 Gross per 24 hour  Intake 320 ml  Output 2350 ml  Net -2030 ml    Filed Weights   01/26/21 0848  Weight: 74.8 kg   Body mass index is 30.18 kg/m.   Physical Exam:  General: Obese built, not in obvious distress HENT:   No scleral pallor or icterus noted. Oral mucosa is moist.  The neck from recent surgery Chest:  Clear breath sounds.  Diminished breath sounds bilaterally. No crackles or wheezes.  CVS: S1 &S2 heard. No murmur.  Regular rate and rhythm. Abdomen: Soft, nontender, nondistended.  Bowel sounds are heard.   Extremities: No cyanosis, clubbing or edema.  Peripheral pulses are palpable.  Erythema of the right forearm with some skin slough Psych: Alert, awake and oriented, normal mood CNS:  No cranial nerve deficits.  Power equal in all extremities.   Skin: Warm and dry.  Erythema of the right forearm with  some skin slough.   Data Review: I have personally reviewed the following laboratory data and studies,  CBC: Recent Labs  Lab 02/02/21 0334 02/04/21 0522 02/05/21 0456 02/06/21 0351 02/08/21 0212  WBC 9.6 9.8 9.4 10.1 10.4  HGB 7.7* 7.6* 7.8* 8.5* 9.2*  HCT 22.1* 22.0* 22.7* 25.3* 27.6*  MCV 85.7 85.3 87.6 87.8 87.3  PLT 145* 212 299 350 433*    Basic Metabolic Panel: Recent Labs  Lab 02/02/21 0334 02/03/21 0531 02/04/21 0522 02/05/21 0456 02/06/21 0351 02/08/21 0212  NA 125* 126* 127* 132* 133* 131*  K 3.5 3.3* 3.4* 3.5 4.4 4.4  CL 88* 87* 89* 92* 96* 96*  CO2 '26 27 29 31 24 24  '$ GLUCOSE 162* 96 116*  117* 336* 186*  BUN 50* 42* 40* 31* 34* 23  CREATININE 1.81* 1.52* 1.40* 1.15* 1.15* 1.00  CALCIUM 7.1* 7.1* 7.6* 7.7* 7.6* 8.1*  MG 2.0  --  2.0 1.9 1.9 1.7  PHOS 2.2* 3.0 3.3 2.6 3.5 3.5    Liver Function Tests: Recent Labs  Lab 02/03/21 0531 02/04/21 0522 02/05/21 0456 02/06/21 0351 02/08/21 0212  AST  --  62*  --   --  60*  ALT  --  57*  --   --  61*  ALKPHOS  --  118  --   --  111  BILITOT  --  0.9  --   --  0.9  PROT  --  4.8*  --   --  5.5*  ALBUMIN 1.7* 1.7* 1.7* 2.0* 2.1*    No results for input(s): LIPASE, AMYLASE in the last 168 hours. No results for input(s): AMMONIA in the last 168 hours. Cardiac Enzymes: No results for input(s): CKTOTAL, CKMB, CKMBINDEX, TROPONINI in the last 168 hours. BNP (last 3 results) No results for input(s): BNP in the last 8760 hours.  ProBNP (last 3 results) No results for input(s): PROBNP in the last 8760 hours.  CBG: Recent Labs  Lab 02/07/21 1947 02/07/21 2356 02/08/21 0332 02/08/21 0756 02/08/21 1119  GLUCAP 146* 173* 186* 186* 214*    Recent Results (from the past 240 hour(s))  Urine Culture     Status: Abnormal   Collection Time: 01/29/21  4:21 PM   Specimen: Urine, Random  Result Value Ref Range Status   Specimen Description   Final    URINE, RANDOM Performed at [facility], [address] [place], [place] [address]    Special Requests   Final    NONE Performed at [facility], [address], [place], [place] [address]    Culture >=100,000 COLONIES/mL KLEBSIELLA PNEUMONIAE (A)  Final   Report Status 02/01/2021 FINAL  Final   Organism ID, Bacteria KLEBSIELLA PNEUMONIAE (A)  Final      Susceptibility   Klebsiella pneumoniae - MIC*    AMPICILLIN RESISTANT Resistant     CEFAZOLIN <=4 SENSITIVE Sensitive     CEFEPIME <=0.12 SENSITIVE Sensitive     CEFTRIAXONE <=0.25 SENSITIVE Sensitive     CIPROFLOXACIN <=0.25 SENSITIVE Sensitive     GENTAMICIN <=1 SENSITIVE Sensitive     IMIPENEM <=0.25 SENSITIVE Sensitive     NITROFURANTOIN 64 INTERMEDIATE Intermediate     TRIMETH/SULFA <=20 SENSITIVE Sensitive     AMPICILLIN/SULBACTAM 4 SENSITIVE Sensitive     PIP/TAZO <=4 SENSITIVE Sensitive     * >=100,000 COLONIES/mL KLEBSIELLA PNEUMONIAE  Culture, blood (Routine X 2) w Reflex to ID Panel     Status: Abnormal   Collection Time: 01/29/21  5:30 PM   Specimen: BLOOD RIGHT ARM  Result Value Ref Range Status   Specimen Description   Final    BLOOD RIGHT ARM Performed at [facility], [address] [place], [place] [address]    Special Requests   Final    BOTTLES DRAWN AEROBIC AND ANAEROBIC Blood Culture adequate volume Performed at [facility], [address], [place], [place] [address]    Culture  Setup Time   Final    GRAM NEGATIVE RODS AEROBIC AND ANAEROBIC BOTTLES Gram Stain Report Called to,Read Back By and Verified With: EVernona Rieger (PHARMACY @ [place]) 01/30/21 @ 0819 BY [place] Haw Performed at [facility], [address], [place], [place] [address]    Culture (A)  Final  KLEBSIELLA PNEUMONIAE SUSCEPTIBILITIES PERFORMED ON PREVIOUS CULTURE WITHIN THE LAST 5 DAYS. Performed at [facility], [place] [address] [place], [place] [address]    Report Status 02/01/2021 FINAL  Final  Culture, blood (Routine X 2) w Reflex to ID Panel     Status: Abnormal   Collection Time:  01/29/21  5:30 PM   Specimen: BLOOD LEFT ARM  Result Value Ref Range Status   Specimen Description   Final    BLOOD LEFT ARM Performed at [facility], [address] [place], [place] [address]    Special Requests   Final    BOTTLES DRAWN AEROBIC AND ANAEROBIC Blood Culture adequate volume Performed at [facility], [address] [place], [place] [address]    Culture  Setup Time   Final    GRAM NEGATIVE RODS BOTH AEROBIC AND ANAEROBIC BOTTLES Gram Stain Report Called to,Read Back By and Verified With: E. SINCLAIR @ [facility] 01/30/21 @ 0819 BY S BEARD    Culture KLEBSIELLA PNEUMONIAE (A)  Final   Report Status 02/01/2021 FINAL  Final   Organism ID, Bacteria KLEBSIELLA PNEUMONIAE  Final      Susceptibility   Klebsiella pneumoniae - MIC*    AMPICILLIN RESISTANT Resistant     CEFAZOLIN <=4 SENSITIVE Sensitive     CEFEPIME <=0.12 SENSITIVE Sensitive     CEFTAZIDIME <=1 SENSITIVE Sensitive     CEFTRIAXONE <=0.25 SENSITIVE Sensitive     CIPROFLOXACIN <=0.25 SENSITIVE Sensitive     GENTAMICIN <=1 SENSITIVE Sensitive     IMIPENEM <=0.25 SENSITIVE Sensitive     TRIMETH/SULFA <=20 SENSITIVE Sensitive     AMPICILLIN/SULBACTAM 4 SENSITIVE Sensitive     PIP/TAZO <=4 SENSITIVE Sensitive     * KLEBSIELLA PNEUMONIAE  Blood Culture ID Panel (Reflexed)     Status: Abnormal   Collection Time: 01/29/21  5:30 PM  Result Value Ref Range Status   Enterococcus faecalis NOT DETECTED NOT DETECTED Final   Enterococcus Faecium NOT DETECTED NOT DETECTED Final   Listeria monocytogenes NOT DETECTED NOT DETECTED Final   Staphylococcus species NOT DETECTED NOT DETECTED Final   Staphylococcus aureus (BCID) NOT DETECTED NOT DETECTED Final   Staphylococcus epidermidis NOT DETECTED NOT DETECTED Final   Staphylococcus lugdunensis NOT DETECTED NOT DETECTED Final   Streptococcus species NOT DETECTED NOT DETECTED Final   Streptococcus agalactiae NOT DETECTED NOT DETECTED Final   Streptococcus pneumoniae NOT  DETECTED NOT DETECTED Final   Streptococcus pyogenes NOT DETECTED NOT DETECTED Final   A.calcoaceticus-baumannii NOT DETECTED NOT DETECTED Final   Bacteroides fragilis NOT DETECTED NOT DETECTED Final   Enterobacterales DETECTED (A) NOT DETECTED Final    Comment: Enterobacterales represent a large order of gram negative bacteria, not a single organism. CRITICAL RESULT CALLED TO, READ BACK BY AND VERIFIED WITH: EMILY SINCLAIR AT 1404 ON 01/30/21 [place] KJ    Enterobacter cloacae complex NOT DETECTED NOT DETECTED Final   Escherichia coli NOT DETECTED NOT DETECTED Final   Klebsiella aerogenes NOT DETECTED NOT DETECTED Final   Klebsiella oxytoca NOT DETECTED NOT DETECTED Final   Klebsiella pneumoniae DETECTED (A) NOT DETECTED Final    Comment: CRITICAL RESULT CALLED TO, READ BACK BY AND VERIFIED WITH: PHARMD EMILY SINCLAIR AT 1404 ON 01/30/21 BY KJ    Proteus species NOT DETECTED NOT DETECTED Final   Salmonella species NOT DETECTED NOT DETECTED Final   Serratia marcescens NOT DETECTED NOT DETECTED Final   Haemophilus influenzae NOT DETECTED NOT DETECTED Final   Neisseria meningitidis NOT DETECTED  NOT DETECTED Final   Pseudomonas aeruginosa NOT DETECTED NOT DETECTED Final   Stenotrophomonas maltophilia NOT DETECTED NOT DETECTED Final   Candida albicans NOT DETECTED NOT DETECTED Final   Candida auris NOT DETECTED NOT DETECTED Final   Candida glabrata NOT DETECTED NOT DETECTED Final   Candida krusei NOT DETECTED NOT DETECTED Final   Candida parapsilosis NOT DETECTED NOT DETECTED Final   Candida tropicalis NOT DETECTED NOT DETECTED Final   Cryptococcus neoformans/gattii NOT DETECTED NOT DETECTED Final   CTX-M ESBL NOT DETECTED NOT DETECTED Final   Carbapenem resistance IMP NOT DETECTED NOT DETECTED Final   Carbapenem resistance KPC NOT DETECTED NOT DETECTED Final   Carbapenem resistance NDM NOT DETECTED NOT DETECTED Final   Carbapenem resist OXA 48 LIKE NOT DETECTED NOT DETECTED Final    Carbapenem resistance VIM NOT DETECTED NOT DETECTED Final    Comment: Performed at [facility], [address], [place], [place] [address]  Resp Panel by RT-PCR (Flu A&B, Covid) Nasopharyngeal Swab     Status: None   Collection Time: 01/29/21  7:18 PM   Specimen: Nasopharyngeal Swab; Nasopharyngeal(NP) swabs in vial transport medium  Result Value Ref Range Status   SARS Coronavirus 2 by RT PCR NEGATIVE NEGATIVE Final    Comment: (NOTE) SARS-CoV-2 target nucleic acids are NOT DETECTED.  The SARS-CoV-2 RNA is generally detectable in upper respiratory specimens during the acute phase of infection. The lowest concentration of SARS-CoV-2 viral copies this assay can detect is 138 copies/mL. A negative result does not preclude SARS-Cov-2 infection and should not be used as the sole basis for treatment or other patient management decisions. A negative result may occur with  improper specimen collection/handling, submission of specimen other than nasopharyngeal swab, presence of viral mutation(s) within the areas targeted by this assay, and inadequate number of viral copies(<138 copies/mL). A negative result must be combined with clinical observations, patient history, and epidemiological information. The expected result is Negative.  Fact Sheet for Patients:  EntrepreneurPulse.com.au  Fact Sheet for Healthcare Providers:  IncredibleEmployment.be  This test is no t yet approved or cleared by the [place] FDA and  has been authorized for detection and/or diagnosis of SARS-CoV-2 by FDA under an Emergency Use Authorization (EUA). This EUA will remain  in effect (meaning this test can be used) for the duration of the COVID-19 declaration under Section 564(b)(1) of the Act, 21 U.S.C.section 360bbb-3(b)(1), unless the authorization is terminated  or revoked sooner.       Influenza A by PCR NEGATIVE NEGATIVE Final   Influenza B by PCR NEGATIVE  NEGATIVE Final    Comment: (NOTE) The Xpert Xpress SARS-CoV-2/FLU/RSV plus assay is intended as an aid in the diagnosis of influenza from Nasopharyngeal swab specimens and should not be used as a sole basis for treatment. Nasal washings and aspirates are unacceptable for Xpert Xpress SARS-CoV-2/FLU/RSV testing.  Fact Sheet for Patients: EntrepreneurPulse.com.au  Fact Sheet for Healthcare Providers: IncredibleEmployment.be  This test is not yet approved or cleared by the [place] FDA and has been authorized for detection and/or diagnosis of SARS-CoV-2 by FDA under an Emergency Use Authorization (EUA). This EUA will remain in effect (meaning this test can be used) for the duration of the COVID-19 declaration under Section 564(b)(1) of the Act, 21 U.S.C. section 360bbb-3(b)(1), unless the authorization is terminated or revoked.  Performed at [facility], [address] [place], [place] [address]   MRSA PCR Screening     Status: None   Collection  Time: 01/30/21  1:18 PM   Specimen: Nasal Mucosa; Nasopharyngeal  Result Value Ref Range Status   MRSA by PCR NEGATIVE NEGATIVE Final    Comment:        The GeneXpert MRSA Assay (FDA approved for NASAL specimens only), is one component of a comprehensive MRSA colonization surveillance program. It is not intended to diagnose MRSA infection nor to guide or monitor treatment for MRSA infections. Performed at [facility], [place] [address], [place], [place] [address]   Culture, blood (Routine X 2) w Reflex to ID Panel     Status: None   Collection Time: 01/31/21  7:19 PM   Specimen: BLOOD LEFT HAND  Result Value Ref Range Status   Specimen Description BLOOD LEFT HAND  Final   Special Requests   Final    BOTTLES DRAWN AEROBIC AND ANAEROBIC Blood Culture results may not be optimal due to an inadequate volume of blood received in culture bottles   Culture   Final    NO GROWTH 5  DAYS Performed at [facility], [place] [address], [place], [place] [address]    Report Status 02/05/2021 FINAL  Final  Culture, blood (Routine X 2) w Reflex to ID Panel     Status: None   Collection Time: 01/31/21  7:19 PM   Specimen: BLOOD RIGHT HAND  Result Value Ref Range Status   Specimen Description BLOOD RIGHT HAND  Final   Special Requests   Final    BOTTLES DRAWN AEROBIC ONLY Blood Culture adequate volume   Culture   Final    NO GROWTH 5 DAYS Performed at [facility], [place] [address], [place], [place] [address]    Report Status 02/05/2021 FINAL  Final  Surgical pcr screen     Status: None   Collection Time: 02/04/21  8:15 PM   Specimen: Nasal Mucosa; Nasal Swab  Result Value Ref Range Status   MRSA, PCR NEGATIVE NEGATIVE Final   Staphylococcus aureus NEGATIVE NEGATIVE Final    Comment: (NOTE) The Xpert SA Assay (FDA approved for NASAL specimens in patients 62 years of age and older), is one component of a comprehensive surveillance program. It is not intended to diagnose infection nor to guide or monitor treatment. Performed at [facility], [place] [address], [place], [place] [address]      Studies: No results found.    Flora Lipps, MD  [facility] 02/08/2021  If 7PM-7AM, please contact night-coverage

## 2021-02-08 NOTE — Progress Notes (Signed)
   Providing Compassionate, Quality Care - Together   Subjective: Patient reports no issues overnight. She has some right arm pain where an IV extravasated.  Objective: Vital signs in last 24 hours: Temp:  [97.8 F (36.6 C)-98.5 F (36.9 C)] 98.2 F (36.8 C) (06/11 1123) Pulse Rate:  [76-91] 91 (06/11 1123) Resp:  [18-21] 18 (06/11 1123) BP: (139-170)/(60-83) 170/75 (06/11 1123) SpO2:  [94 %-96 %] 96 % (06/11 1123)  Intake/Output from previous day: 06/10 0701 - 06/11 0700 In: 320 [P.O.:320] Out: 2750 [Urine:2750] Intake/Output this shift: No intake/output data recorded.  Patient is alert PERRLA Speech clear MAE, generalized weakness Iision clean, dry, and intact  Lab Results: Recent Labs    02/06/21 0351 02/08/21 0212  WBC 10.1 10.4  HGB 8.5* 9.2*  HCT 25.3* 27.6*  PLT 350 433*   BMET Recent Labs    02/06/21 0351 02/08/21 0212  NA 133* 131*  K 4.4 4.4  CL 96* 96*  CO2 24 24  GLUCOSE 336* 186*  BUN 34* 23  CREATININE 1.15* 1.00  CALCIUM 7.6* 8.1*    Studies/Results: No results found.  Assessment/Plan: Patient underwent a C3-4 ACDF by Dr. Christella Noa for cord compression on /03/2021. Patient also with urosepsis, followed by ID. Phlebitis/cellulitis of RUE.   LOS: 10 days   -Continue to mobilize with therapies; possibly discharging to SNF if not enough family support available at home for discharge following CIR -Abx per ID   Viona Gilmore, DNP, AGNP-C Nurse Practitioner  [facility]. [address], [address], [place], [place] [address] P: [phone]    F: [phone]  02/08/2021, 11:37 AM

## 2021-02-09 LAB — GLUCOSE, CAPILLARY
Glucose-Capillary: 105 mg/dL — ABNORMAL HIGH (ref 70–99)
Glucose-Capillary: 128 mg/dL — ABNORMAL HIGH (ref 70–99)
Glucose-Capillary: 131 mg/dL — ABNORMAL HIGH (ref 70–99)
Glucose-Capillary: 151 mg/dL — ABNORMAL HIGH (ref 70–99)
Glucose-Capillary: 152 mg/dL — ABNORMAL HIGH (ref 70–99)
Glucose-Capillary: 161 mg/dL — ABNORMAL HIGH (ref 70–99)

## 2021-02-09 NOTE — Progress Notes (Addendum)
   Providing Compassionate, Quality Care - Together   Subjective: Patient reports no issues overnight.  Objective: Vital signs in last 24 hours: Temp:  [98 F (36.7 C)-98.4 F (36.9 C)] 98 F (36.7 C) (06/12 0828) Pulse Rate:  [84-91] 87 (06/12 0828) Resp:  [16-18] 18 (06/12 0828) BP: (144-170)/(69-125) 166/72 (06/12 0828) SpO2:  [93 %-100 %] 100 % (06/12 0828)  Intake/Output from previous day: 06/11 0701 - 06/12 0700 In: -  Out: 850 [Urine:850] Intake/Output this shift: No intake/output data recorded.   Patient is alert PERRLA Speech clear MAE, generalized weakness Incision is clean, dry, and intact, some swelling, patient without SOB or change in voice  Lab Results: Recent Labs    02/08/21 0212  WBC 10.4  HGB 9.2*  HCT 27.6*  PLT 433*   BMET Recent Labs    02/08/21 0212  NA 131*  K 4.4  CL 96*  CO2 24  GLUCOSE 186*  BUN 23  CREATININE 1.00  CALCIUM 8.1*    Studies/Results: No results found.  Assessment/Plan: Patient underwent a C3-4 ACDF by Dr. Christella Noa for cord compression on 02/05/2021. Patient also with urosepsis, followed by ID. Phlebitis/cellulitis of RUE.   LOS: 11 days   -Continue to mobilize with therapies; possibly discharging to SNF if not enough family support available at home for discharge following CIR -Abx per ID     Viona Gilmore, DNP, AGNP-C Nurse Practitioner  [facility] [place]. [address], [place], [place] [address] P: [phone]    F: [phone]  02/09/2021, 11:01 AM

## 2021-02-09 NOTE — Progress Notes (Signed)
PROGRESS NOTE    [name]  [id number] DOB: 12/29/1935 DOA: 01/26/2021 PCP: Curlene Labrum, MD    Chief Complaint  Patient presents with   Fall    Brief Narrative:   85 year old female with past medical history of diabetes mellitus type 2, hypertension, CKD stage IIIb, neuropathy had presented on 528 with all fall.  In the ED she did have extensive imaging without any acute abnormalities and left AGAINST MEDICAL ADVICE.  On reaching home, she was profoundly weak and was unable to ambulate and was brought into [facility] initially.  Further imaging was performed.  Physical therapy evaluated.  She was recommended a skilled nursing facility but remained in the ED l for few days and continued to have the extremity weakness.  She did have acute urinary retention on 01/29/2021 so MRI of the spine was performed which showed C3-C4 cord compression.  Neurosurgery was consulted.  On 01/30/2021, patient had shock and PCCM was consulted and patient was transferred to the ICU.  Patient was briefly put on vasopressors.  Infectious disease was consulted as well.  Patient was then transferred out of the ICU.  At this time, patient has undergone surgical intervention and is awaiting for CIR.Marland Kitchen  Assessment & Plan:   Principal Problem:   Cord compression [facility]) Active Problems:   Acute kidney injury superimposed on CKD ([place])   Type 2 diabetes mellitus with diabetic neuropathy, unspecified (HCC)   Acute lower UTI   Hyponatremia   Septic shock due to Klebsiella pneumoniae (HCC)   Metabolic acidosis with normal anion gap and failure of bicarbonate regeneration   Urinary retention   Central cord syndrome at C4 level of cervical spinal cord (HCC)   Cord compression for cervical stenosis:  NS consulted and she underwent C3 C4 decompression on 02/05/21.  Therapy eval recommending CIR.    Atrial fibrillation with RVR Rate controlled.  Continue with amiodarone.    Septic shock sec to  klebsiella  UTI  and bacteremia:  Resolved. ,  Complete the course of keflex on discharge.     AKI on stage 3 b Ckd: Last creatinine around 1.  Recheck labs in am.    Type 2 DM;  CBG (last 3)  Recent Labs    02/09/21 0828 02/09/21 1136 02/09/21 1623  GLUCAP 131* 105* 151*   Non insulin dependent, continue with SSI.     DVT prophylaxis: (Lovenox) Code Status: (DNR) Family Communication: none at bedside.  Disposition:   Status is: Inpatient  Remains inpatient appropriate because:Unsafe d/c plan  Dispo: The patient is from: Home              Anticipated d/c is to: CIR              Patient currently is medically stable to d/c.   Difficult to place patient No         Antimicrobials: none.    Subjective: No new complaints. No chest pain or sob  Objective: Vitals:   02/09/21 0332 02/09/21 0828 02/09/21 1136 02/09/21 1626  BP: (!) 153/69 (!) 166/72 (!) 162/72 (!) 133/56  Pulse: 88 87 89 94  Resp: '18 18 18 18  '$ Temp: 98.1 F (36.7 C) 98 F (36.7 C) 98.4 F (36.9 C) 98.8 F (37.1 C)  TempSrc: Oral Oral Oral Oral  SpO2: 93% 100% 98% 96%  Weight:      Height:        Intake/Output Summary (Last 24 hours) at 02/09/2021 1825  Last data filed at 02/09/2021 1800 Gross per 24 hour  Intake 700 ml  Output 2400 ml  Net -1700 ml   Filed Weights   01/26/21 0848  Weight: 74.8 kg    Examination:  General exam: Appears calm and comfortable  Respiratory system: Clear to auscultation. Respiratory effort normal. Cardiovascular system: S1 & S2 heard, RRR. No pedal edema. Gastrointestinal system: Abdomen is nondistended, soft and nontender. . Normal bowel sounds heard. Central nervous system: Alert and oriented. No focal neurological deficits. Extremities: Symmetric 5 x 5 power. Skin: No rashes, lesions or ulcers Psychiatry: Mood & affect appropriate.     Data Reviewed: I have personally reviewed following labs and imaging studies  CBC: Recent Labs  Lab  02/04/21 0522 02/05/21 0456 02/06/21 0351 02/08/21 0212  WBC 9.8 9.4 10.1 10.4  HGB 7.6* 7.8* 8.5* 9.2*  HCT 22.0* 22.7* 25.3* 27.6*  MCV 85.3 87.6 87.8 87.3  PLT 212 299 350 433*    Basic Metabolic Panel: Recent Labs  Lab 02/03/21 0531 02/04/21 0522 02/05/21 0456 02/06/21 0351 02/08/21 0212  NA 126* 127* 132* 133* 131*  K 3.3* 3.4* 3.5 4.4 4.4  CL 87* 89* 92* 96* 96*  CO2 '27 29 31 24 24  '$ GLUCOSE 96 116* 117* 336* 186*  BUN 42* 40* 31* 34* 23  CREATININE 1.52* 1.40* 1.15* 1.15* 1.00  CALCIUM 7.1* 7.6* 7.7* 7.6* 8.1*  MG  --  2.0 1.9 1.9 1.7  PHOS 3.0 3.3 2.6 3.5 3.5    GFR: Estimated Creatinine Clearance: 41.1 mL/min (by C-G formula based on SCr of 1 mg/dL).  Liver Function Tests: Recent Labs  Lab 02/03/21 0531 02/04/21 0522 02/05/21 0456 02/06/21 0351 02/08/21 0212  AST  --  62*  --   --  60*  ALT  --  57*  --   --  61*  ALKPHOS  --  118  --   --  111  BILITOT  --  0.9  --   --  0.9  PROT  --  4.8*  --   --  5.5*  ALBUMIN 1.7* 1.7* 1.7* 2.0* 2.1*    CBG: Recent Labs  Lab 02/08/21 2353 02/09/21 0444 02/09/21 0828 02/09/21 1136 02/09/21 1623  GLUCAP 147* 128* 131* 105* 151*     Recent Results (from the past 240 hour(s))  Culture, blood (Routine X 2) w Reflex to ID Panel     Status: None   Collection Time: 01/31/21  7:19 PM   Specimen: BLOOD LEFT HAND  Result Value Ref Range Status   Specimen Description BLOOD LEFT HAND  Final   Special Requests   Final    BOTTLES DRAWN AEROBIC AND ANAEROBIC Blood Culture results may not be optimal due to an inadequate volume of blood received in culture bottles   Culture   Final    NO GROWTH 5 DAYS Performed at [facility], [place] [address] [place], [place] [address]    Report Status 02/05/2021 FINAL  Final  Culture, blood (Routine X 2) w Reflex to ID Panel     Status: None   Collection Time: 01/31/21  7:19 PM   Specimen: BLOOD RIGHT HAND  Result Value Ref Range Status   Specimen Description BLOOD  RIGHT HAND  Final   Special Requests   Final    BOTTLES DRAWN AEROBIC ONLY Blood Culture adequate volume   Culture   Final    NO GROWTH 5 DAYS Performed at [facility], [place]  [address], [place], [place] [address]    Report Status 02/05/2021 FINAL  Final  Surgical pcr screen     Status: None   Collection Time: 02/04/21  8:15 PM   Specimen: Nasal Mucosa; Nasal Swab  Result Value Ref Range Status   MRSA, PCR NEGATIVE NEGATIVE Final   Staphylococcus aureus NEGATIVE NEGATIVE Final    Comment: (NOTE) The Xpert SA Assay (FDA approved for NASAL specimens in patients 19 years of age and older), is one component of a comprehensive surveillance program. It is not intended to diagnose infection nor to guide or monitor treatment. Performed at [facility], [place] [address] [place], [place] [address]          Radiology Studies: No results found.      Scheduled Meds:  amiodarone  200 mg Oral Daily   atorvastatin  20 mg Oral QPM   cephALEXin  500 mg Oral Q8H   Chlorhexidine Gluconate Cloth  6 each Topical Daily   enoxaparin (LOVENOX) injection  40 mg Subcutaneous Q24H   feeding supplement  237 mL Oral BID BM   Gerhardt's butt cream   Topical BID   insulin aspart  0-20 Units Subcutaneous Q4H   multivitamin with minerals  1 tablet Oral Daily   pantoprazole  40 mg Oral Daily   polyethylene glycol  17 g Oral Daily   senna  2 tablet Oral Daily   Continuous Infusions:  sodium chloride 250 mL (02/01/21 1033)   sodium chloride 0 mL (02/01/21 1245)     LOS: 11 days        Hosie Poisson, MD [facility]   To contact the attending provider between 7A-7P or the covering provider during after hours 7P-7A, please log into the web site [email or web address] and access using universal [place] password for that web site. If you do not have the password, please call the hospital operator.  02/09/2021, 6:25 PM

## 2021-02-10 LAB — GLUCOSE, CAPILLARY
Glucose-Capillary: 114 mg/dL — ABNORMAL HIGH (ref 70–99)
Glucose-Capillary: 122 mg/dL — ABNORMAL HIGH (ref 70–99)
Glucose-Capillary: 131 mg/dL — ABNORMAL HIGH (ref 70–99)
Glucose-Capillary: 135 mg/dL — ABNORMAL HIGH (ref 70–99)
Glucose-Capillary: 147 mg/dL — ABNORMAL HIGH (ref 70–99)
Glucose-Capillary: 151 mg/dL — ABNORMAL HIGH (ref 70–99)

## 2021-02-10 LAB — SARS CORONAVIRUS 2 (TAT 6-24 HRS): SARS Coronavirus 2: NEGATIVE

## 2021-02-10 NOTE — Progress Notes (Signed)
Patient ID: [name], female   DOB: [birth year], 85 y.o.   MRN: [id number] BP (!) 157/68 (BP Location: Left Arm)   Pulse 87   Temp 98.1 F (36.7 C) (Oral)   Resp 18   Ht '5\' 2"'$  (1.575 m)   Wt 74.8 kg   SpO2 99%   BMI 30.18 kg/m  Alert and oriented x 4, speech is clear and fluent Moving upper extremities better, 3/5, intrinisics 2/5 Lower extremties 3/5 Awaiting placement Wound is clean, and dry

## 2021-02-10 NOTE — Progress Notes (Addendum)
Inpatient Rehab Admissions Coordinator:   Rehab MD reviewed this Pt.'s case this morning and stated that she felt Pt. Is not doing enough with therapies to indicate that she could tolerate intensity  of CIR (Pt. Is max-total A+2), so I will not pursue CIR admission for this Pt. At this time. There is some confusion about Pt.'s SNF benefits, so I reached out to [facility] to discuss with family. CIR will sign off at this time.  Clemens Catholic, [place], [place] Admissions Coordinator  [phone] ([place]) [phone] (office)

## 2021-02-10 NOTE — Progress Notes (Signed)
Orthopedic Tech Progress Note Patient Details:  [name] 09/16/1935 [id number]  As I was taking in the [facility] BOOT into room ,THERAPIST showed up and said she was heading into th room and would apply it to patient. Ortho Devices Type of Ortho Device: Prafo boot/shoe Ortho Device/Splint Location: N/A Ortho Device/Splint Interventions: Ordered, Other (comment)   Post Interventions Patient Tolerated: Well Instructions Provided: Care of device  Janit Pagan 02/10/2021, 11:44 AM

## 2021-02-10 NOTE — Progress Notes (Addendum)
[facility] CONSULT PROGRESS NOTE    Progress Note  [name]  [id number] DOB: 1936/02/19 DOA: 01/26/2021 PCP: Curlene Labrum, MD     Brief Narrative:   [name] is an 85 y.o. female past medical history of diabetes mellitus type 2, essential hypertension, chronic and she is stage IIIb peripheral neuropathy presents on 01/25/2021 secondary to a fall.  In the ED extensive imaging showed no acute abnormalities the patient left AGAINST MEDICAL ADVICE on reaching home she was profoundly weak and unable to ambulate brought into [facility] further imaging was performed and physical therapy evaluated her the recommended skilled nursing facility after remaining in the ED for several days with continue lower extremity weakness she was found to have acute urinary retention on 01/29/2021 MRI of the spine was performed that showed a C3-C4 chronic compression neurosurgery was consulted on 01/30/2021 the patient had shock, PCCM admitted to the ICU briefly needing vasopressors.  At the patient underwent surgical intervention with decompression and awaiting CIR placement.  We were consulted for medical management.   Assessment/Plan:   Cord compression with cervical stenosis C3-C4: Status post decompression on 02/05/2021. Further management per neurosurgery. Physical therapy consulted recommended CIR.  A. fib with RVR: Converted to sinus rhythm now on amiodarone rate control not a candidate for anticoagulation due to recent surgery.  Metabolic encephalopathy: Improved after urinary tract infection treatment.  Septic shock secondary to Klebsiella pneumonia/bacteremia: Surveillance cultures and initial cultures were negative she was initially started on IV Rocephin currently on oral Keflex. Has been discontinued on 02/07/2021. Last day of Keflex will be on 02/10/2021.  Acute kidney injury on chronic kidney disease stage IIIb: Likely prerenal in the setting of septic shock  resolved conservative treatment.  Hypovolemic hyponatremia: Resolved with IV fluid hydration.  Hypokalemia: Repleted orally now resolved.  Deconditioning/extreme weakness: Awaiting CIR.    DVT prophylaxis: SCD Family Communication:none Status is: Inpatient    Code Status:     Code Status Orders  (From admission, onward)           Start     Ordered   02/02/21 1517  Do not attempt resuscitation (DNR)  Continuous       Question Answer Comment  In the event of cardiac or respiratory ARREST Do not call a "code blue"   In the event of cardiac or respiratory ARREST Do not perform Intubation, CPR, defibrillation or ACLS   In the event of cardiac or respiratory ARREST Use medication by any route, position, wound care, and other measures to relive pain and suffering. May use oxygen, suction and manual treatment of airway obstruction as needed for comfort.      02/02/21 1516           Code Status History     Date Active Date Inactive Code Status Order ID Comments User Context   01/29/2021 2348 02/02/2021 1516 Full Code [id number]  Bethena Roys, MD Inpatient   12/02/2020 0719 12/04/2020 1514 Full Code [id number]  Murlean Iba, MD ED         IV Access:   Peripheral IV   Procedures and diagnostic studies:   No results found.   Medical Consultants:   None.   Subjective:    [name] no complaints  Objective:    Vitals:   02/10/21 0002 02/10/21 0003 02/10/21 0513 02/10/21 0514  BP: (!) 143/59  (!) 142/62   Pulse: 85  79   Resp:  14  16  Temp: 98 F (36.7 C)  97.7 F (36.5 C)   TempSrc: Oral  Oral   SpO2: 98%  98%   Weight:      Height:       SpO2: 98 % O2 Flow Rate (L/min): 2 L/min   Intake/Output Summary (Last 24 hours) at 02/10/2021 0743 Last data filed at 02/10/2021 0502 Gross per 24 hour  Intake 700 ml  Output 3450 ml  Net -2750 ml   Filed Weights   01/26/21 0848  Weight: 74.8 kg    Exam: General exam: In no  acute distress. Respiratory system: Good air movement and clear to auscultation. Cardiovascular system: S1 & S2 heard, RRR. No JVD. Gastrointestinal system: Abdomen is nondistended, soft and nontender.  Extremities: No pedal edema. Skin: No rashes, lesions or ulcers Psychiatry: Judgement and insight appear normal. Mood & affect appropriate.    Data Reviewed:    Labs: Basic Metabolic Panel: Recent Labs  Lab 02/04/21 0522 02/05/21 0456 02/06/21 0351 02/08/21 0212  NA 127* 132* 133* 131*  K 3.4* 3.5 4.4 4.4  CL 89* 92* 96* 96*  CO2 '29 31 24 24  '$ GLUCOSE 116* 117* 336* 186*  BUN 40* 31* 34* 23  CREATININE 1.40* 1.15* 1.15* 1.00  CALCIUM 7.6* 7.7* 7.6* 8.1*  MG 2.0 1.9 1.9 1.7  PHOS 3.3 2.6 3.5 3.5   GFR Estimated Creatinine Clearance: 41.1 mL/min (by C-G formula based on SCr of 1 mg/dL). Liver Function Tests: Recent Labs  Lab 02/04/21 0522 02/05/21 0456 02/06/21 0351 02/08/21 0212  AST 62*  --   --  60*  ALT 57*  --   --  61*  ALKPHOS 118  --   --  111  BILITOT 0.9  --   --  0.9  PROT 4.8*  --   --  5.5*  ALBUMIN 1.7* 1.7* 2.0* 2.1*   No results for input(s): LIPASE, AMYLASE in the last 168 hours. No results for input(s): AMMONIA in the last 168 hours. Coagulation profile No results for input(s): INR, PROTIME in the last 168 hours. COVID-19 Labs  No results for input(s): DDIMER, FERRITIN, LDH, CRP in the last 72 hours.  Lab Results  Component Value Date   SARSCOV2NAA NEGATIVE 01/29/2021   [place] NEGATIVE 01/26/2021   [place] NEGATIVE 12/02/2020    CBC: Recent Labs  Lab 02/04/21 0522 02/05/21 0456 02/06/21 0351 02/08/21 0212  WBC 9.8 9.4 10.1 10.4  HGB 7.6* 7.8* 8.5* 9.2*  HCT 22.0* 22.7* 25.3* 27.6*  MCV 85.3 87.6 87.8 87.3  PLT 212 299 350 433*   Cardiac Enzymes: No results for input(s): CKTOTAL, CKMB, CKMBINDEX, TROPONINI in the last 168 hours. BNP (last 3 results) No results for input(s): PROBNP in the last 8760 hours. CBG: Recent  Labs  Lab 02/09/21 1136 02/09/21 1623 02/09/21 2005 02/09/21 2338 02/10/21 0324  GLUCAP 105* 151* 161* 152* 131*   D-Dimer: No results for input(s): DDIMER in the last 72 hours. Hgb A1c: No results for input(s): HGBA1C in the last 72 hours. Lipid Profile: No results for input(s): CHOL, HDL, LDLCALC, TRIG, CHOLHDL, LDLDIRECT in the last 72 hours. Thyroid function studies: No results for input(s): TSH, T4TOTAL, T3FREE, THYROIDAB in the last 72 hours.  Invalid input(s): FREET3 Anemia work up: No results for input(s): VITAMINB12, FOLATE, FERRITIN, TIBC, IRON, RETICCTPCT in the last 72 hours. Sepsis Labs: Recent Labs  Lab 02/04/21 0522 02/05/21 0456 02/06/21 0351 02/08/21 0212  WBC 9.8 9.4 10.1 10.4   Microbiology Recent  Results (from the past 240 hour(s))  Culture, blood (Routine X 2) w Reflex to ID Panel     Status: None   Collection Time: 01/31/21  7:19 PM   Specimen: BLOOD LEFT HAND  Result Value Ref Range Status   Specimen Description BLOOD LEFT HAND  Final   Special Requests   Final    BOTTLES DRAWN AEROBIC AND ANAEROBIC Blood Culture results may not be optimal due to an inadequate volume of blood received in culture bottles   Culture   Final    NO GROWTH 5 DAYS Performed at [facility], [place] [address] [place], [place] [address]    Report Status 02/05/2021 FINAL  Final  Culture, blood (Routine X 2) w Reflex to ID Panel     Status: None   Collection Time: 01/31/21  7:19 PM   Specimen: BLOOD RIGHT HAND  Result Value Ref Range Status   Specimen Description BLOOD RIGHT HAND  Final   Special Requests   Final    BOTTLES DRAWN AEROBIC ONLY Blood Culture adequate volume   Culture   Final    NO GROWTH 5 DAYS Performed at [facility], [place] [address], [place], [place] [address]    Report Status 02/05/2021 FINAL  Final  Surgical pcr screen     Status: None   Collection Time: 02/04/21  8:15 PM   Specimen: Nasal Mucosa; Nasal Swab  Result Value Ref Range  Status   MRSA, PCR NEGATIVE NEGATIVE Final   Staphylococcus aureus NEGATIVE NEGATIVE Final    Comment: (NOTE) The Xpert SA Assay (FDA approved for NASAL specimens in patients 74 years of age and older), is one component of a comprehensive surveillance program. It is not intended to diagnose infection nor to guide or monitor treatment. Performed at [facility], [place] [address], [place], [place] [address]      Medications:    amiodarone  200 mg Oral Daily   atorvastatin  20 mg Oral QPM   cephALEXin  500 mg Oral Q8H   Chlorhexidine Gluconate Cloth  6 each Topical Daily   enoxaparin (LOVENOX) injection  40 mg Subcutaneous Q24H   feeding supplement  237 mL Oral BID BM   Gerhardt's butt cream   Topical BID   insulin aspart  0-20 Units Subcutaneous Q4H   multivitamin with minerals  1 tablet Oral Daily   pantoprazole  40 mg Oral Daily   polyethylene glycol  17 g Oral Daily   senna  2 tablet Oral Daily   Continuous Infusions:  sodium chloride 250 mL (02/01/21 1033)   sodium chloride 0 mL (02/01/21 1245)      LOS: 12 days   Charlynne Cousins  [facility]  02/10/2021, 7:43 AM

## 2021-02-10 NOTE — Progress Notes (Signed)
Nutrition Follow-up  DOCUMENTATION CODES:  Not applicable  INTERVENTION:  Please obtain updated weight.   -Continue Ensure Enlive po BID, each supplement provides 350 kcal and 20 grams of protein -Continue Magic cup BID with meals, each supplement provides 290 kcal and 9 grams of protein -Continue MVI with minerals daily  NUTRITION DIAGNOSIS:  Inadequate oral intake related to poor appetite as evidenced by meal completion < 50%. --  ongoing  GOAL:  Patient will meet greater than or equal to 90% of their needs -- progressing  MONITOR:  PO intake, Supplement acceptance, Skin, Weight trends, Labs, I & O's  REASON FOR ASSESSMENT:  Consult Assessment of nutrition requirement/status, Poor PO  ASSESSMENT:  Pt with a PMH including type 2 DM, HTN, CKD stage 3b, and neuropathy initially presented 5/28 s/p fall and left AMA. PT later returned to [facility] ED and was found to have acute urinary retention and C3-C4 cord compression on 6/1. On 6/2, pt had shock requiring intubation and tx to ICU. Pt has since stabilized enough to tx out of the ICU. Neurosurgery following.  6/8 s/p decompression 6/9 diet liberalized to regular  Per MD, pt completed abx regimen for Klebsiella PNA/bacteremia today. Pt was initially planned for discharge to CIR; however, rehab MD feels pt cannot tolerate intensity of therapies for CIR. CIR now signed off. TOC discussing disposition with family. Noted potential question regarding SNF coverage?  Pt with variable intake. 0-100% meal completion x 7 recorded meals (40% average meal intake). Per RN, pt doing well with supplements. Recommend continue current plan of care.   Weight has not been updated in >7 days. Recommend obtaining new weight. Edema appears to have improved as pt only with non-pitting edema to RUE per RN assessment.   UOP: 3442m x24 hours Stool: 1x unmeasured stool occurrence x24 hours  Medications:  amiodarone  200 mg Oral Daily   atorvastatin  20 mg  Oral QPM   cephALEXin  500 mg Oral Q8H   Chlorhexidine Gluconate Cloth  6 each Topical Daily   enoxaparin (LOVENOX) injection  40 mg Subcutaneous Q24H   feeding supplement  237 mL Oral BID BM   Gerhardt's butt cream   Topical BID   insulin aspart  0-20 Units Subcutaneous Q4H   multivitamin with minerals  1 tablet Oral Daily   pantoprazole  40 mg Oral Daily   polyethylene glycol  17 g Oral Daily   senna  2 tablet Oral Daily   Labs: Recent Labs  Lab 02/05/21 0456 02/06/21 0351 02/08/21 0212  NA 132* 133* 131*  K 3.5 4.4 4.4  CL 92* 96* 96*  CO2 '31 24 24  '$ BUN 31* 34* 23  CREATININE 1.15* 1.15* 1.00  CALCIUM 7.7* 7.6* 8.1*  MG 1.9 1.9 1.7  PHOS 2.6 3.5 3.5  GLUCOSE 117* 336* 186*  CBGs 131-122-135  Diet Order:   Diet Order             Diet regular Room service appropriate? No; Fluid consistency: Thin  Diet effective now                  EDUCATION NEEDS:  No education needs have been identified at this time  Skin:  Skin Assessment: Skin Integrity Issues: Skin Integrity Issues:: Other (Comment) Other: fissure related to moisture to inner gluteal cleft,  Last BM:  6/12 type 6  Height:  Ht Readings from Last 1 Encounters:  01/26/21 '5\' 2"'$  (1.575 m)   Weight:  Wt Readings from  Last 1 Encounters:  01/26/21 74.8 kg   BMI:  Body mass index is 30.18 kg/m.  Estimated Nutritional Needs:  Kcal:  1650-1850 Protein:  80-95g Fluid:  1L   Larkin Ina, MS, RD, LDN RD pager number and weekend/on-call pager number located in [place].
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
## 2021-02-10 NOTE — TOC Progression Note (Signed)
Transition of Care (TOC) - Progression Note    Patient Details  Name: [name] MRN: [id number] Date of Birth: 06/24/1936  Transition of Care (TOC) CM/SW Contact  Elizabeth M Paisley, LCSW Phone Number: 02/10/2021, 3:46 PM  Clinical Narrative:   CSW alerted by rehab admissions that patient not at a level to tolerate CIR, and family asking about other options. CSW met with patient, granddaughter, and daughter at bedside to discuss options available. CSW discussed with patient and family insurance coverage and out of pocket max costs, so they will have to cover copay amounts up to a certain amount and then her care will be completely covered. Family asked about whether they'd have to pay the amount up front or if [facility] would bill them. CSW reached out to [facility], discussed with [name] and they will bill the family for any amount that is owed. CSW presented information to granddaughter and they would like to move forward with the [facility]. CSW asked [facility] to initiate insurance authorization through [employer] and sent updated therapy notes from today. CSW to continue to follow.    Expected Discharge Plan: Skilled Nursing Facility Barriers to Discharge: Insurance Authorization, Continued Medical Work up  Expected Discharge Plan and Services Expected Discharge Plan: Skilled Nursing Facility     Post Acute Care Choice: NA                                         Social Determinants of Health (SDOH) Interventions    Readmission Risk Interventions Readmission Risk Prevention Plan 12/04/2020  Medication Screening Complete  Transportation Screening Complete  Some recent data might be hidden    

## 2021-02-11 LAB — GLUCOSE, CAPILLARY
Glucose-Capillary: 132 mg/dL — ABNORMAL HIGH (ref 70–99)
Glucose-Capillary: 141 mg/dL — ABNORMAL HIGH (ref 70–99)
Glucose-Capillary: 147 mg/dL — ABNORMAL HIGH (ref 70–99)
Glucose-Capillary: 133 mg/dL — ABNORMAL HIGH (ref 70–99)
Glucose-Capillary: 124 mg/dL — ABNORMAL HIGH (ref 70–99)
Glucose-Capillary: 99 mg/dL (ref 70–99)

## 2021-02-11 MED ORDER — TIZANIDINE HCL 4 MG PO TABS: 4.0000 mg | ORAL_TABLET | Freq: Four times a day (QID) | ORAL | 0 refills | Status: DC | PRN

## 2021-02-11 MED ORDER — ALPRAZOLAM 0.25 MG PO TABS: 0.2500 mg | ORAL_TABLET | Freq: Three times a day (TID) | ORAL | 0 refills | Status: AC | PRN
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
## 2021-02-11 NOTE — TOC Progression Note (Addendum)
Transition of Care [facility]) - Progression Note    Patient Details  Name: ELLAGRACE MITTENDORF MRN: [id number] Date of Birth: 07/03/1936  Transition of [facility]) CM/SW [place], [place] Phone Number: 02/11/2021, 10:02 AM  Clinical Narrative:   CSW contacted this morning by [facility] that authorization has been received, patient can admit today. CSW contacted neurosurgery office to leave a message with MD secretary asking about discharge, awaiting response. CSW to follow.  UPDATE 1:17 PM: CSW left another voicemail for MD secretary requesting discharge, if stable. CSW to follow.  UPDATE 1:44 PM: CSW received call from MD secretary that the MD has the message about requesting discharge. CSW to follow.    Expected Discharge Plan: Skilled Nursing Facility Barriers to Discharge: SNF Pending discharge orders, SNF Pending discharge summary  Expected Discharge Plan and Services Expected Discharge Plan: [place]     Post Acute Care Choice: NA                                         Social Determinants of Health (SDOH) Interventions    Readmission Risk Interventions Readmission Risk Prevention Plan 12/04/2020  Medication Screening Complete  Transportation Screening Complete  Some recent data might be hidden

## 2021-02-11 NOTE — Discharge Instructions (Signed)

## 2021-02-11 NOTE — Plan of Care (Signed)

## 2021-02-11 NOTE — Social Work (Signed)
EDCSW received call from floor RN requesting assistance with Pt discharge. CSW called [id number], they state that they were not expecting Pt to be d/c'd to them tonight and Pt has been removed from their system for tonight.  [facility] will be expecting Pt tomorrow 6/15.  Floor RN updated.  Vergie Living MSW LCSWA Transitions of Care  Clinical Social Worker  [facility]  [phone]

## 2021-02-11 NOTE — Discharge Summary (Signed)
Physician Discharge Summary  Patient ID: [name] MRN: [id number] DOB/AGE: 1936-03-04 85 y.o.  Admit date: 01/26/2021 Discharge date: 02/11/2021  Admission Diagnoses:Klebsiella sepsis Hnp cervical C3/4 with myelopathy Klebsiella UTI  Discharge Diagnoses:  Principal Problem:   Cord compression [facility]) Active Problems:   Acute kidney injury superimposed on CKD (HCC)   Type 2 diabetes mellitus with diabetic neuropathy, unspecified (HCC)   Acute lower UTI   Hyponatremia   Septic shock due to Klebsiella pneumoniae (HCC)   Metabolic acidosis with normal anion gap and failure of bicarbonate regeneration   Urinary retention   Central cord syndrome at C4 level of cervical spinal cord [facility])   Discharged Condition: fair  Hospital Course: [name] is a 85 y.o. female Presented to [facility] septic, with a UTI, and floridly myelopathic. She had urinary overflow incontinence. [name] was unable to void at admission. [name] had fallen multiple times prior to admission on 6/1. She was evaluated in the ED on 5/28 due to a fall down two stairs, cranial imaging Was negative. She reported weakness in her lower extremities bilaterally, stated she had difficulty moving them. Nonetheless she was discharged home. [name] then presented to the [facility] ED. At that time the family made it known that she was unable to walk. She was evaluated by pt/ot and plan was to discharge from the ED with a snf placement. She became febrile while in the ED, then MRI spine ordered due to urinary incontinence, and weakness. Cord signal on MRI at C4, transferred [facility]. She developed AKI on top of her CKD during the hospitalization.  [name] then underwent an ACDF at C3/4 on 02/05/2021. Post op her physical exam was unchanged, her wound was mildly ecchymotic. She is voiding, working with Physical and Occupational therapy.  Treatments: surgery: Anterior Cervical  decompression C3/4 Arthrodesis C3-4 with 12x14x63m  Coalition peek cage(globus)mis with allograft morsels(vesuvious) Anterior instrumentation(coalition) C3-4    Discharge Exam: Blood pressure (!) 156/66, pulse 83, temperature 97.9 F (36.6 C), temperature source Oral, resp. rate 16, height '5\' 2"'$  (1.575 m), weight 74.8 kg, SpO2 97 %. Weakness in upper extremities, 3/5, grip and intrinsics, cannot close hands, very poor intrinsics. Poor coordination in all extremities. Remains incontinent bowel, bladder. Wound is clean dry, no signs of infection.  Disposition: Discharge disposition: 70-Another Health Care Institution Not Defined      Cervical Three-Four Cord Compression  Allergies as of 02/11/2021       Reactions   Codeine         Medication List     STOP taking these medications    bisacodyl 10 MG suppository Commonly known as: DULCOLAX   HYDROcodone-acetaminophen 5-325 MG tablet Commonly known as: NORCO/VICODIN   Lantus SoloStar 100 UNIT/ML Solostar Pen Generic drug: insulin glargine   lisinopril 10 MG tablet Commonly known as: ZESTRIL   metFORMIN 500 MG tablet Commonly known as: GLUCOPHAGE   methocarbamol 500 MG tablet Commonly known as: ROBAXIN   NON FORMULARY   omeprazole 20 MG capsule Commonly known as: PRILOSEC   senna-docusate 8.6-50 MG tablet Commonly known as: Senokot-S       TAKE these medications    acetaminophen 325 MG tablet Commonly known as: TYLENOL Take 2 tablets (650 mg total) by mouth every 6 (six) hours.   albuterol 108 (90 Base) MCG/ACT inhaler Commonly known as: VENTOLIN HFA Inhale 1 puff into the lungs every 4 (four) hours as needed for wheezing  or shortness of breath.   ALPRAZolam 0.25 MG tablet Commonly known as: XANAX Take 1 tablet (0.25 mg total) by mouth 3 (three) times daily as needed for up to 10 days for anxiety.   atorvastatin 20 MG tablet Commonly known as: LIPITOR Take 1 tablet (20 mg total) by mouth every  evening.   chlorthalidone 25 MG tablet Commonly known as: HYGROTON Take 25 mg by mouth daily.   gabapentin 300 MG capsule Commonly known as: NEURONTIN Take 1 capsule (300 mg total) by mouth 2 (two) times daily.   mirtazapine 15 MG tablet Commonly known as: REMERON Take 15 mg by mouth at bedtime.   pantoprazole 40 MG tablet Commonly known as: PROTONIX Take 40 mg by mouth daily.   tiZANidine 4 MG tablet Commonly known as: ZANAFLEX Take 1 tablet (4 mg total) by mouth every 6 (six) hours as needed for muscle spasms.        Contact information for follow-up providers     Ashok Pall, MD. Schedule an appointment as soon as possible for a visit in 3 week(s).   Specialty: Neurosurgery Why: please call to make an appointment Contact information: [address] [place] [address] [phone]              Contact information for after-discharge care     [facility] .   Service: Skilled Nursing Contact information: [address] [place] [phone]                     Signed: Ashok Pall 02/11/2021, 6:18 PM

## 2021-02-12 ENCOUNTER — Inpatient Hospital Stay
Admission: RE | Admit: 2021-02-12 | Discharge: 2021-06-22 | Disposition: A | Discharge status: Skilled Nursing Facility | Payer: Medicare HMO | Source: Ambulatory Visit | Attending: Internal Medicine | Admitting: Internal Medicine

## 2021-02-12 DIAGNOSIS — N1832 Chronic kidney disease, stage 3b: Secondary | ICD-10-CM | POA: Diagnosis not present

## 2021-02-12 DIAGNOSIS — U071 COVID-19: Secondary | ICD-10-CM | POA: Diagnosis not present

## 2021-02-12 DIAGNOSIS — E1142 Type 2 diabetes mellitus with diabetic polyneuropathy: Secondary | ICD-10-CM | POA: Diagnosis not present

## 2021-02-12 DIAGNOSIS — E871 Hypo-osmolality and hyponatremia: Secondary | ICD-10-CM | POA: Diagnosis not present

## 2021-02-12 DIAGNOSIS — D649 Anemia, unspecified: Secondary | ICD-10-CM | POA: Diagnosis not present

## 2021-02-12 DIAGNOSIS — M87052 Idiopathic aseptic necrosis of left femur: Secondary | ICD-10-CM | POA: Diagnosis not present

## 2021-02-12 DIAGNOSIS — E43 Unspecified severe protein-calorie malnutrition: Secondary | ICD-10-CM | POA: Diagnosis not present

## 2021-02-12 DIAGNOSIS — E1159 Type 2 diabetes mellitus with other circulatory complications: Secondary | ICD-10-CM | POA: Diagnosis not present

## 2021-02-12 DIAGNOSIS — Z794 Long term (current) use of insulin: Secondary | ICD-10-CM | POA: Diagnosis not present

## 2021-02-12 DIAGNOSIS — E1122 Type 2 diabetes mellitus with diabetic chronic kidney disease: Secondary | ICD-10-CM | POA: Diagnosis not present

## 2021-02-12 DIAGNOSIS — M545 Low back pain, unspecified: Secondary | ICD-10-CM | POA: Diagnosis not present

## 2021-02-12 DIAGNOSIS — S82832D Other fracture of upper and lower end of left fibula, subsequent encounter for closed fracture with routine healing: Secondary | ICD-10-CM | POA: Diagnosis not present

## 2021-02-12 DIAGNOSIS — N1831 Chronic kidney disease, stage 3a: Secondary | ICD-10-CM | POA: Diagnosis not present

## 2021-02-12 DIAGNOSIS — E785 Hyperlipidemia, unspecified: Secondary | ICD-10-CM | POA: Diagnosis not present

## 2021-02-12 DIAGNOSIS — M85842 Other specified disorders of bone density and structure, left hand: Secondary | ICD-10-CM | POA: Diagnosis not present

## 2021-02-12 DIAGNOSIS — Z23 Encounter for immunization: Secondary | ICD-10-CM | POA: Diagnosis not present

## 2021-02-12 DIAGNOSIS — M5136 Other intervertebral disc degeneration, lumbar region: Secondary | ICD-10-CM | POA: Diagnosis not present

## 2021-02-12 DIAGNOSIS — Z20822 Contact with and (suspected) exposure to covid-19: Secondary | ICD-10-CM | POA: Diagnosis not present

## 2021-02-12 DIAGNOSIS — R030 Elevated blood-pressure reading, without diagnosis of hypertension: Secondary | ICD-10-CM | POA: Diagnosis not present

## 2021-02-12 DIAGNOSIS — I959 Hypotension, unspecified: Secondary | ICD-10-CM | POA: Diagnosis not present

## 2021-02-12 DIAGNOSIS — M4802 Spinal stenosis, cervical region: Secondary | ICD-10-CM | POA: Diagnosis not present

## 2021-02-12 DIAGNOSIS — I152 Hypertension secondary to endocrine disorders: Secondary | ICD-10-CM | POA: Diagnosis not present

## 2021-02-12 DIAGNOSIS — Z0000 Encounter for general adult medical examination without abnormal findings: Secondary | ICD-10-CM | POA: Diagnosis not present

## 2021-02-12 DIAGNOSIS — R918 Other nonspecific abnormal finding of lung field: Secondary | ICD-10-CM | POA: Diagnosis not present

## 2021-02-12 DIAGNOSIS — M533 Sacrococcygeal disorders, not elsewhere classified: Secondary | ICD-10-CM | POA: Diagnosis not present

## 2021-02-12 DIAGNOSIS — Z743 Need for continuous supervision: Secondary | ICD-10-CM | POA: Diagnosis not present

## 2021-02-12 DIAGNOSIS — E119 Type 2 diabetes mellitus without complications: Secondary | ICD-10-CM | POA: Diagnosis not present

## 2021-02-12 DIAGNOSIS — G9520 Unspecified cord compression: Secondary | ICD-10-CM | POA: Diagnosis not present

## 2021-02-12 DIAGNOSIS — M87051 Idiopathic aseptic necrosis of right femur: Secondary | ICD-10-CM | POA: Diagnosis not present

## 2021-02-12 DIAGNOSIS — N183 Chronic kidney disease, stage 3 unspecified: Secondary | ICD-10-CM | POA: Diagnosis not present

## 2021-02-12 DIAGNOSIS — E1140 Type 2 diabetes mellitus with diabetic neuropathy, unspecified: Secondary | ICD-10-CM | POA: Diagnosis not present

## 2021-02-12 DIAGNOSIS — Z4789 Encounter for other orthopedic aftercare: Secondary | ICD-10-CM | POA: Diagnosis not present

## 2021-02-12 DIAGNOSIS — E1169 Type 2 diabetes mellitus with other specified complication: Secondary | ICD-10-CM | POA: Diagnosis not present

## 2021-02-12 DIAGNOSIS — Z66 Do not resuscitate: Secondary | ICD-10-CM | POA: Diagnosis present

## 2021-02-12 DIAGNOSIS — S14124S Central cord syndrome at C4 level of cervical spinal cord, sequela: Secondary | ICD-10-CM | POA: Diagnosis not present

## 2021-02-12 DIAGNOSIS — I10 Essential (primary) hypertension: Secondary | ICD-10-CM | POA: Diagnosis not present

## 2021-02-12 DIAGNOSIS — J1282 Pneumonia due to coronavirus disease 2019: Secondary | ICD-10-CM | POA: Diagnosis not present

## 2021-02-12 DIAGNOSIS — S14124D Central cord syndrome at C4 level of cervical spinal cord, subsequent encounter: Secondary | ICD-10-CM | POA: Diagnosis not present

## 2021-02-12 DIAGNOSIS — R5381 Other malaise: Secondary | ICD-10-CM | POA: Diagnosis not present

## 2021-02-12 DIAGNOSIS — M7541 Impingement syndrome of right shoulder: Secondary | ICD-10-CM | POA: Diagnosis not present

## 2021-02-12 DIAGNOSIS — I700 Atherosclerosis of aorta: Secondary | ICD-10-CM | POA: Diagnosis not present

## 2021-02-12 DIAGNOSIS — H40033 Anatomical narrow angle, bilateral: Secondary | ICD-10-CM | POA: Diagnosis not present

## 2021-02-12 DIAGNOSIS — G8252 Quadriplegia, C1-C4 incomplete: Secondary | ICD-10-CM | POA: Diagnosis not present

## 2021-02-12 LAB — GLUCOSE, CAPILLARY
Glucose-Capillary: 138 mg/dL — ABNORMAL HIGH (ref 70–99)
Glucose-Capillary: 146 mg/dL — ABNORMAL HIGH (ref 70–99)
Glucose-Capillary: 103 mg/dL — ABNORMAL HIGH (ref 70–99)
Glucose-Capillary: 156 mg/dL — ABNORMAL HIGH (ref 70–99)
Glucose-Capillary: 127 mg/dL — ABNORMAL HIGH (ref 70–99)

## 2021-02-12 NOTE — TOC Transition Note (Signed)
Transition of Care [facility]) - CM/SW Discharge Note   Patient Details  Name: [name] MRN: [id number] Date of Birth: 06-20-1936  Transition of Care [facility]) CM/SW Contact:  Geralynn Ochs, LCSW Phone Number: 02/12/2021, 3:06 PM   Clinical Narrative:   Nurse to call report to [phone].    Final next level of care: Skilled Nursing Facility Barriers to Discharge: Barriers Resolved   Patient Goals and CMS Choice Patient states their goals for this hospitalization and ongoing recovery are:: to get rehab and back home CMS Medicare.gov Compare Post Acute Care list provided to:: Patient Represenative (must comment) Choice offered to / list presented to : Adult Children  Discharge Placement              Patient chooses bed at: [facility] Patient to be transferred to facility by: [place] Name of family member notified: [place] Patient and family notified of of transfer: 02/12/21  Discharge Plan and Services     Post Acute Care Choice: NA                               Social Determinants of Health (SDOH) Interventions     Readmission Risk Interventions Readmission Risk Prevention Plan 12/04/2020  Medication Screening Complete  Transportation Screening Complete  Some recent data might be hidden

## 2021-02-12 NOTE — Progress Notes (Signed)
MD notified of pt not voiding since indwelling catheter removed @ 1217. Pt bladder scanned and 308 noted but pt states she feels pressure. MD gave verbal order to insert indwelling catheter. Non latex 14 Fr Catheter inserted with pt tolerating well. No complaints of pain or discomfort noted. Staff will continue to monitor.

## 2021-02-12 NOTE — Progress Notes (Signed)
Attempted to call report x 3, no answer. 

## 2021-02-12 NOTE — Plan of Care (Signed)
  Problem: Education: Goal: Knowledge of General Education information will improve Description: Including pain rating scale, medication(s)/side effects and non-pharmacologic comfort measures Outcome: Completed/Met

## 2021-02-12 NOTE — Progress Notes (Signed)
[facility] arrived to pick up patient, IV removed, pressure held, dressing applied, patient tolerated well.  Foley catheter left in place.  Patient out with belongings via stretcher.

## 2021-02-12 NOTE — Progress Notes (Signed)
MD gave order to discontinue indwelling catheter. Pt tolerated well. No signs or pain or discomfort. Pt due to void. Staff continuing to monitor.

## 2021-02-12 NOTE — Progress Notes (Signed)
Attempted to call report two more times, no answer.  Called other nurses station, person answering stated she would transfer call, advised patient has been picked up by [facility] and calls not being answered.  Again, call not answered. Attempted to call again, chose option for receptionist, got a voice mail.

## 2021-02-17 ENCOUNTER — Encounter: Payer: Self-pay | Admitting: Adult Health

## 2021-02-17 ENCOUNTER — Non-Acute Institutional Stay (SKILLED_NURSING_FACILITY): Payer: Medicare HMO | Admitting: Adult Health

## 2021-02-17 DIAGNOSIS — E1122 Type 2 diabetes mellitus with diabetic chronic kidney disease: Secondary | ICD-10-CM

## 2021-02-17 DIAGNOSIS — E1142 Type 2 diabetes mellitus with diabetic polyneuropathy: Secondary | ICD-10-CM | POA: Diagnosis not present

## 2021-02-17 DIAGNOSIS — R339 Retention of urine, unspecified: Secondary | ICD-10-CM

## 2021-02-17 DIAGNOSIS — G8252 Quadriplegia, C1-C4 incomplete: Secondary | ICD-10-CM | POA: Diagnosis not present

## 2021-02-17 DIAGNOSIS — I700 Atherosclerosis of aorta: Secondary | ICD-10-CM | POA: Insufficient documentation

## 2021-02-17 DIAGNOSIS — D649 Anemia, unspecified: Secondary | ICD-10-CM

## 2021-02-17 DIAGNOSIS — A414 Sepsis due to anaerobes: Secondary | ICD-10-CM

## 2021-02-17 DIAGNOSIS — S82832D Other fracture of upper and lower end of left fibula, subsequent encounter for closed fracture with routine healing: Secondary | ICD-10-CM

## 2021-02-17 DIAGNOSIS — E1159 Type 2 diabetes mellitus with other circulatory complications: Secondary | ICD-10-CM | POA: Diagnosis not present

## 2021-02-17 DIAGNOSIS — E1169 Type 2 diabetes mellitus with other specified complication: Secondary | ICD-10-CM

## 2021-02-17 DIAGNOSIS — S14124D Central cord syndrome at C4 level of cervical spinal cord, subsequent encounter: Secondary | ICD-10-CM

## 2021-02-17 DIAGNOSIS — N183 Chronic kidney disease, stage 3 unspecified: Secondary | ICD-10-CM

## 2021-02-17 DIAGNOSIS — G9520 Unspecified cord compression: Secondary | ICD-10-CM

## 2021-02-17 DIAGNOSIS — F419 Anxiety disorder, unspecified: Secondary | ICD-10-CM

## 2021-02-17 DIAGNOSIS — K219 Gastro-esophageal reflux disease without esophagitis: Secondary | ICD-10-CM

## 2021-02-17 DIAGNOSIS — K5909 Other constipation: Secondary | ICD-10-CM

## 2021-02-17 DIAGNOSIS — M87052 Idiopathic aseptic necrosis of left femur: Secondary | ICD-10-CM

## 2021-02-17 DIAGNOSIS — R6521 Severe sepsis with septic shock: Secondary | ICD-10-CM

## 2021-02-17 DIAGNOSIS — I152 Hypertension secondary to endocrine disorders: Secondary | ICD-10-CM

## 2021-02-17 DIAGNOSIS — E43 Unspecified severe protein-calorie malnutrition: Secondary | ICD-10-CM | POA: Insufficient documentation

## 2021-02-17 DIAGNOSIS — M87051 Idiopathic aseptic necrosis of right femur: Secondary | ICD-10-CM | POA: Diagnosis not present

## 2021-02-17 DIAGNOSIS — E1140 Type 2 diabetes mellitus with diabetic neuropathy, unspecified: Secondary | ICD-10-CM

## 2021-02-17 DIAGNOSIS — E785 Hyperlipidemia, unspecified: Secondary | ICD-10-CM

## 2021-02-17 NOTE — Progress Notes (Signed)
Location:  [facility] Room Number: 133-P Place of Service:  SNF (31)   CODE STATUS: DNR  Allergies  Allergen Reactions  . Codeine     Chief Complaint  Patient presents with  . Hospitalization Follow-up    HPI:  She is a 85 year old woman who has been hospitalized from 01-26-21 through 02-11-21. Her medical history includes: hyperlipidemia; hypertension; diabetes. She initially presented to the ED status post fall down 2 steps at home. She did complain of weakness at that time. She was discharged to home. She returned to the ED unable to walk; with bladder distension of 1200 mL. She was to be discharged to SNF; was found to have a fever of 102.8. her urine and blood cultures demonstrated klebsiella pneumoniae. She was floridly myelopathic. She was unable to void. She did have a foley placed. Her MRI was done due to urine retention; and progressive weakness. She underwent a ACDF ( anterior cervical decompression discectomy fusion) on 02-05-21. She remained weak in all extremities. She developed acute on chronic renal failure. She continues to have significant weakness in all extremities. She requires a foley. She is presently denying any pain. She is unable to feed herself at this time. Her goal at this time is to return back home. However; I do not think that this will be possible for her. She will continue to be followed for her chronic illnesses including: Hyperlipidemia associated with type 2 diabetes mellitus:     Type 2 diabetes mellitus with diabetic neuropathy without long term current use of insulin:   . Hypertension associated with diabetes:    GERD without esophagitis    Past Medical History:  Diagnosis Date  . Diabetes mellitus without complication ([place])   . High cholesterol   . Neck pain   . Neuropathy     Past Surgical History:  Procedure Laterality Date  . ABDOMINAL HYSTERECTOMY    . ANTERIOR CERVICAL DECOMP/DISCECTOMY FUSION N/A 02/05/2021    Procedure: Cervical Three-Four  Anterior cervical decompression/discectomy/fusion;  Surgeon: Ashok Pall, MD;  Location: [facility];  Service: Neurosurgery;  Laterality: N/A;  RM 20  . APPENDECTOMY    . BACK SURGERY    . CERVICAL DISC SURGERY    . HAND SURGERY    . KNEE SURGERY      Social History   Socioeconomic History  . Marital status: Widowed    Spouse name: Not on file  . Number of children: Not on file  . Years of education: Not on file  . Highest education level: Not on file  Occupational History  . Not on file  Tobacco Use  . Smoking status: Never  . Smokeless tobacco: Never  Vaping Use  . Vaping Use: Never used  Substance and Sexual Activity  . Alcohol use: Never  . Drug use: Never  . Sexual activity: Never  Other Topics Concern  . Not on file  Social History Narrative  . Not on file   Social Determinants of Health   Financial Resource Strain: Not on file  Food Insecurity: Not on file  Transportation Needs: Not on file  Physical Activity: Not on file  Stress: Not on file  Social Connections: Not on file  Intimate Partner Violence: Not on file   Family History  Problem Relation Age of Onset  . Cardiomyopathy Father   . Cancer Mother       VITAL SIGNS BP (!) 142/58   Pulse 80   Temp 98.1 F (  36.7 C)   Resp 20   Ht '5\' 2"'$  (1.575 m)   Wt 153 lb 9.6 oz (69.7 kg)   SpO2 96%   BMI 28.09 kg/m   Outpatient Encounter Medications as of 02/17/2021  Medication Sig  . acetaminophen (TYLENOL) 325 MG tablet Take 2 tablets (650 mg total) by mouth every 6 (six) hours.  Marland Kitchen albuterol (VENTOLIN HFA) 108 (90 Base) MCG/ACT inhaler Inhale 1 puff into the lungs every 4 (four) hours as needed for wheezing or shortness of breath.  . ALPRAZolam (XANAX) 0.25 MG tablet Take 1 tablet (0.25 mg total) by mouth 3 (three) times daily as needed for up to 10 days for anxiety.  Marland Kitchen atorvastatin (LIPITOR) 20 MG tablet Take 1 tablet (20 mg total) by mouth every evening.  .  chlorthalidone (HYGROTON) 25 MG tablet Take 25 mg by mouth daily.  Marland Kitchen gabapentin (NEURONTIN) 300 MG capsule Take 1 capsule (300 mg total) by mouth 2 (two) times daily.  . mirtazapine (REMERON) 15 MG tablet Take 15 mg by mouth at bedtime.  . NON FORMULARY Diet: NAS, Cons CHO  . omeprazole (PRILOSEC) 40 MG capsule Take 40 mg by mouth daily.  Marland Kitchen tiZANidine (ZANAFLEX) 4 MG tablet Take 1 tablet (4 mg total) by mouth every 6 (six) hours as needed for muscle spasms.  . [DISCONTINUED] pantoprazole (PROTONIX) 40 MG tablet Take 40 mg by mouth daily.   No facility-administered encounter medications on file as of 02/17/2021.     SIGNIFICANT DIAGNOSTIC EXAMS   PREVIOUS   12-02-20; ct of head: No acute intracranial abnormality. Polypoid sinus disease.  12-02-20: ct of cervical spine:  Postoperative and degenerative changes in the cervical spine. No acute bony abnormality.  12-02-20; ct of pelvis:  1. No evidence of acute fracture or dislocation of the pelvis or hips. 2. 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 3. Degenerative changes in the hips with subcortical cysts on the left hip.  12-02-20: ct of left knee:  1. Subtle acute nondisplaced fracture through the medial aspect of the head of the fibula. 2. 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 3. There is a small joint effusion.  12-02-20: mri left hip:  1. No acute findings involving the left hip. 2. Mild chronic bilateral hip avascular necrosis. Moderate degenerative hip findings bilaterally. No hip effusion or regional bursitis. 3. Mild left greater than right hamstring tendinopathy. 4. 1.3 cm left eccentric Bartholin's cyst or Gartner cyst.  12-02-20: mri lumbar spine:  1. 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 2. L3-4: Bilateral posterolateral disc bulges, more prominent on the left. Mild left foraminal encroachment that could possibly affect the left L3  nerve. Moderate to severe bilateral facet osteoarthritis. These findings could relate to back pain or referred facet syndrome pain. 3. L4-5: Bilateral facet arthropathy with 2 mm of anterolisthesis. Bulging of the disc more prominent towards the right. Mild narrowing of the lateral recesses and of the intervertebral foramen on the right, but without visible neural compression. Findings at this level could relate to back pain or referred facet syndrome pain. 4. L5-S1: Previous left hemilaminectomy. Endplate osteophytes and bulging of the disc more prominent towards the left. Facet degeneration and hypertrophy left more than right with left foraminal stenosis and subarticular lateral recess stenosis could cause left-sided neural compression.  TODAY  01-25-21: ct of head; maxillofacial cervical spine:  1. No acute intracranial pathology. 2. No acute/traumatic cervical spine pathology. 3. No acute facial bone fractures.  01-26-21:  ct of chest abdomen and pelvis:  1. No noncontrast CT evidence of acute traumatic injury to the chest, abdomen, or pelvis. 2. Distended urinary bladder. Mild bilateral hydronephrosis and hydroureter to the bilateral ureterovesicular junctions. No obstructing calculus or other etiology identified. Correlate for urinary retention. 3. No fracture or dislocation of the lumbar spine. Moderate disc space height loss and osteophytosis at L5-S1. Probable small broad-based posterior disc bulges at L4-L5 and L5-S1. 4. Coronary artery disease. Aortic Atherosclerosis  01-26-21: ct of right shoulder:  1.  No acute osseous injury of the right shoulder. 2. Mild mineralization in the infraspinatus tendon as can be seen with calcific tendinosis.  01-29-21: MRI of spine:  Postoperative and multilevel degenerative changes of the cervical spine. There is severe canal stenosis with cord compression at C3-C4. Abnormal cord signal is present just below this level (at operative levels) and may  reflect edema or myelomalacia No significant degenerative changes of the thoracic spine. Multilevel degenerative changes of lumbar spine similar to recent prior study.  01-30-21: renal ultrasound:  1. No acute abnormality. Small amount of right perinephric fluid, nonspecific.  02-05-21: chest x-ray:  1. Right internal jugular central venous catheter with tip over the mid SVC. No pneumothorax. 2. Hazy lung base opacities likely represent combination of atelectasis and pleural fluid. Vascular congestion.       LABS REVIEWED PREVIOUS   12-02-20: wbc 6.4; hgb 11.3; hct 34.4; mcv 93.0 plt 216; glucose 209; bun 33; creat 1.13; k+ 4.3; na++ 137; ca 9.0; GFR 49; hgb a1c 8.6 12-03-20: wbc 6.5; hgb 9.8; hct 30.5 mcv 93.8 plt 182; glucose 188;bun 41; creat 1.49; k+ 4.4; na++ 134; ca 8.2; GFR 35 12-04-20; glucose 285; bun 29; creat 1.00; k+ 4.3; na++ 138; ca 8.7 GFR 56   TODAY   01-25-21: wbc 6.4; hgb 10.6; hct 31.7; mcv 91.1 plt 227; glucose 152; bun 40; creat 1.2 k+ 4.2; na++ 135; ca 8.7; GFR 42; liver normal albumin 3.5 01-29-21: urine and blood culture: klebsiella pneumoniae 01-31-21: wbc 13.1; hgb 8.3; hct 23.6; mcv 88.1 plt 153; glucose 162; bun 67; creat 2.77; k+ 4.8; na++ 125; ca 7.5; GFR 17; ast 62; alt 57; albumin 1.7 02-04-21: wbc 9.8; hgb 7.6; hct 22.0; mcv 85.3 plt 212; glucose 161 bun 40; creat 1.40; k+ 3.4; na++ 127; ca 7.5; GFR 38; ast 62; alt 57; albumin 1.7  02-08-21: wbc 10.4; hgb 9.2; hct 27.6; mcv 87.3 plt 433; glucose 186; bun 23; creat 1.00 ;k+ 4.4; na++ 131; ca 8.1; GFR 56; ast 60; alt 61; albumin 2.1   Review of Systems  Constitutional:  Negative for malaise/fatigue.  Respiratory:  Negative for cough and shortness of breath.   Cardiovascular:  Negative for chest pain, palpitations and leg swelling.  Gastrointestinal:  Negative for abdominal pain, constipation and heartburn.  Musculoskeletal:  Negative for back pain, joint pain and myalgias.  Skin: Negative.   Neurological:  Positive for  weakness. Negative for dizziness.       All extremities left worse than right   Psychiatric/Behavioral:  The patient is not nervous/anxious.    Physical Exam Constitutional:      General: She is not in acute distress.    Appearance: She is well-developed. She is not diaphoretic.  Neck:     Thyroid: No thyromegaly.  Cardiovascular:     Rate and Rhythm: Normal rate and regular rhythm.     Pulses: Normal pulses.     Heart sounds: Normal heart sounds.  Pulmonary:  Effort: Pulmonary effort is normal. No respiratory distress.     Breath sounds: Normal breath sounds.  Abdominal:     General: Bowel sounds are normal. There is no distension.     Palpations: Abdomen is soft.     Tenderness: There is no abdominal tenderness.  Genitourinary:    Comments: Foley  Musculoskeletal:     Cervical back: Neck supple.     Right lower leg: No edema.     Left lower leg: No edema.     Comments: Anterior neck incision line with bruising present.  Has little movement in bilateral lower extremities with left worse than right Is able to touch head with right arm; unable to with left arm Has bilateral hand weakness.  Limited range of motion to neck    Lymphadenopathy:     Cervical: No cervical adenopathy.  Skin:    General: Skin is warm and dry.  Neurological:     Mental Status: She is alert and oriented to person, place, and time.  Psychiatric:        Mood and Affect: Mood normal.      ASSESSMENT/ PLAN:  TODAY  Central cord syndrome at C4 level of cervical spinal cord subsequent encounter; cord compression; quadriplegia C1-4 incomplete: no change in her status; will continue therapy as ordered: has zanaflex 4 mg every 6 hours as needed for spasticity. Will begin asa 325 mg daily for antiplatelet therapy   2. Closed fracture of proximal end of fibula unspecified fracture morphology sequela: is stable is presently not on narcotic pain relief.   3. Hyperlipidemia associated with type 2  diabetes mellitus: is stable will continue lipitor 20 mg daily   4. Type 2 diabetes mellitus with diabetic neuropathy without long term current use of insulin: hgb a1c 8.6. is off metformin. Her insulin was stopped as well. Her AM cbg readings are in the 190's. Will begin lantus 10 units nightly and will monitor her status.   5. Hypertension associated with diabetes: is stable b/p 142/58 her lisinopril was stopped in the hospital due to renal failure; will monitor her status. Will continue hygroton 25 mg daily   6. GERD without esophagitis: is stable will continue prilosec 40 mg daily   7. Chronic constipation: is presently not on medications will monitor   8. Lumbar radicular syndrome/vascular necrosis bilateral hips: is without change will continue tylenol 650 mg every 6 hours and gabapentin 300 mg twice daily; is off prednisone and robaxin   9. Diabetic peripheral neuropathy: is stable will continue gabapentin 300 mg twice daily   10. CKD stage 3b due to type 2 diabetes; bun 23; creat 1.00; GFR 38 will monitor   11. Normocytic anemia: is stable hgb 9.2 will monitor   12. Septic shock due to klebsiella pneumoniae: has completed ABT will monitor her status.   13. Urine retention: is stable has foley will monitor   14. Chronic anxiety: is stable will continue remeron 15 mg nightly and has xanax 0.25 mg three times daily as needed through 02-20-21.   15. Protein calorie malnutrition severe: is without change: albumin 2.1: will begin prostat 30 mL with meals.   16. Aortic atherosclerosis: ( ct 01-26-21) will monitor   Will check bmp h/h.    Time spent with patient 45 minutes: medical reconciliation goals of  care and therapy needs.   [name] NP [facility]  Contact [phone] Monday through Friday 8am- 5pm  After hours call [phone]

## 2021-02-18 ENCOUNTER — Encounter: Payer: Self-pay | Admitting: Internal Medicine

## 2021-02-18 ENCOUNTER — Non-Acute Institutional Stay (SKILLED_NURSING_FACILITY): Payer: Medicare HMO | Admitting: Internal Medicine

## 2021-02-18 DIAGNOSIS — S14124D Central cord syndrome at C4 level of cervical spinal cord, subsequent encounter: Secondary | ICD-10-CM

## 2021-02-18 DIAGNOSIS — M7541 Impingement syndrome of right shoulder: Secondary | ICD-10-CM

## 2021-02-18 DIAGNOSIS — E1122 Type 2 diabetes mellitus with diabetic chronic kidney disease: Secondary | ICD-10-CM | POA: Diagnosis not present

## 2021-02-18 DIAGNOSIS — E43 Unspecified severe protein-calorie malnutrition: Secondary | ICD-10-CM | POA: Diagnosis not present

## 2021-02-18 DIAGNOSIS — M7540 Impingement syndrome of unspecified shoulder: Secondary | ICD-10-CM | POA: Insufficient documentation

## 2021-02-18 DIAGNOSIS — N183 Chronic kidney disease, stage 3 unspecified: Secondary | ICD-10-CM | POA: Diagnosis not present

## 2021-02-18 DIAGNOSIS — G8252 Quadriplegia, C1-C4 incomplete: Secondary | ICD-10-CM | POA: Diagnosis not present

## 2021-02-18 NOTE — Assessment & Plan Note (Addendum)
Essentially immobile; unable to feed self or turn ASA 325 mg prophylaxis as DVT prophy

## 2021-02-18 NOTE — Assessment & Plan Note (Addendum)
Current creat 1.00/ GFR 56 , Stage 3A. Medication List reviewed; no nephrotoxic agents identified.

## 2021-02-18 NOTE — Assessment & Plan Note (Signed)
Current albumin 2.1/ total protein 5.5 Prostat ordered

## 2021-02-18 NOTE — Assessment & Plan Note (Addendum)
Although right upper extremity has increased range of motion compared to the left upper extremity and lower extremities; she is having pain in the superior biceps area suggestive of shoulder impingement syndrome.  Consider trial of topical Voltaren gel.

## 2021-02-18 NOTE — Progress Notes (Signed)
NURSING HOME LOCATION:  [facility] ROOM NUMBER:  133 P  CODE STATUS:  DNR  PCP:  Judd Lien MD  This is a comprehensive admission note to this SNFperformed on this date less than 30 days from date of admission. Included are preadmission medical/surgical history; reconciled medication list; family history; social history and comprehensive review of systems.  Corrections and additions to the records were documented. Comprehensive physical exam was also performed. Additionally a clinical summary was entered for each active diagnosis pertinent to this admission in the Problem List to enhance continuity of care.  HPI: Patient was hospitalized 5/29 - 02/11/2021 with Klebsiella sepsis in the context of Klebsiella UTI. She presented to the ED with clinical sepsis with florid myelopathy. Urinary overflow incontinence was present and she was unable to void initially.  The clinical state was associated with recurrent falls. She had been evaluated in the ED on 5/28 due to a fall down 2 stairs.  Cranial imaging at that time was negative despite a history of  reported weakness in her lower extremities bilaterally and difficulty moving them.  She was discharged home but presented to the [facility], ED 5/29. Family stated that she was unable to walk.  Initially it was planned to send her to the SNF for rehab but she became febrile while in the ED. Because of urinary incontinence and weakness, MRI of the spine was ordered.  Cord signal on MRI at C4 prompted transfer to [facility].  Course there was complicated by AKI superimposed on baseline CKD. ACDF (anterior cervical decompression) and arthrodesis at C3/4 was performed 6/8.  Postoperative physical exam was unchanged. At discharge she had weakness 3/5 in the upper extremities but was unable to grip with her hands.  Poor coordination was present all extremities.  She remained incontinent of bowel and bladder.  She was discharged to the  SNF for rehab.  Past medical and surgical history: Includes dyslipidemia and diabetes associated with neuropathy. Surgeries and procedures include abdominal hysterectomy, remote back surgery, cervical disc surgery, as well as hand and knee surgeries.  Social history: Nondrinker; never smoked.  Family history: Noncontributory due to advanced age.   Review of systems: She describes pain particularly in the right shoulder but also involving the right upper extremity.  She has numbness in her fingers and legs.  The posterior neck remains sore.  She also has pain in the left hip which is positional.  She also describes a sacral wound.  Constitutional: No fever, significant weight change  Eyes: No redness, discharge, pain, vision change ENT/mouth: No nasal congestion, purulent discharge, earache, change in hearing, sore throat  Cardiovascular: No chest pain, palpitations, paroxysmal nocturnal dyspnea, claudication, edema  Respiratory: No cough, sputum production, hemoptysis, DOE, significant snoring, apnea Gastrointestinal: No heartburn, dysphagia, abdominal pain, nausea /vomiting, rectal bleeding, melena, change in bowels Genitourinary: No dysuria, hematuria, pyuria, incontinence, nocturia Dermatologic: No rash, pruritus Neurologic: No dizziness, headache, syncope, seizures Psychiatric: No significant anxiety, depression, insomnia, anorexia Endocrine: No change in hair/skin/nails, excessive thirst, excessive hunger, excessive urination  Hematologic/lymphatic: No significant bruising, lymphadenopathy, abnormal bleeding  Physical exam:  Pertinent or positive findings: Complete dentures are present. Foley catheter in place.The right dorsalis pedis pulse is stronger than the other pulses. There is minimal spontaneous movement.  She is able to move the right upper extremity more than the left.  There is minimal range of motion of the lower extremities.  There is marked weakness to opposition in all  extremities  especially the left lower extremity.  There is tenderness to palpation over the upper right biceps area.Right forearm is dressed.  She has faint bruising over the forearms in a scattered distribution.  There is also ecchymosis at the anterior cervical operative scar.  General appearance: Adequately nourished; no acute distress, increased work of breathing is present.   Lymphatic: No lymphadenopathy about the head, neck, axilla. Eyes: No conjunctival inflammation or lid edema is present. There is no scleral icterus. Ears:  External ear exam shows no significant lesions or deformities.   Nose:  External nasal examination shows no deformity or inflammation. Nasal mucosa are pink and moist without lesions, exudates Oral exam: Lips and gums are healthy appearing.There is no oropharyngeal erythema or exudate. Neck:  No thyromegaly, masses, tenderness noted.    Heart:  Normal rate and regular rhythm. S1 and S2 normal without gallop, murmur, click, rub.  Lungs: Chest clear to auscultation without wheezes, rhonchi, rales, rubs. Abdomen: Bowel sounds are normal.  Abdomen is soft and nontender with no organomegaly, hernias, masses. GU: Deferred  Extremities:  No cyanosis, clubbing, edema. Neurologic exam:  Balance, Rhomberg, finger to nose testing could not be completed due to clinical state Skin: Warm & dry w/o tenting. No significant  rash.  See clinical summary under each active problem in the Problem List with associated updated therapeutic plan

## 2021-02-20 ENCOUNTER — Other Ambulatory Visit (HOSPITAL_COMMUNITY)
Admission: RE | Admit: 2021-02-20 | Discharge: 2021-02-20 | Disposition: A | Discharge status: Home / Self Care | Payer: Medicare HMO | Source: Skilled Nursing Facility | Attending: Adult Health | Admitting: Adult Health

## 2021-02-20 DIAGNOSIS — I10 Essential (primary) hypertension: Secondary | ICD-10-CM | POA: Insufficient documentation

## 2021-02-20 LAB — BASIC METABOLIC PANEL
Anion gap: 9 (ref 5–15)
BUN: 51 mg/dL — ABNORMAL HIGH (ref 8–23)
CO2: 23 mmol/L (ref 22–32)
Calcium: 8.9 mg/dL (ref 8.9–10.3)
Chloride: 104 mmol/L (ref 98–111)
Creatinine, Ser: 1.17 mg/dL — ABNORMAL HIGH (ref 0.44–1.00)
GFR, Estimated: 47 mL/min — ABNORMAL LOW (ref >60)
Glucose, Bld: 154 mg/dL — ABNORMAL HIGH (ref 70–99)
Potassium: 4.6 mmol/L (ref 3.5–5.1)
Sodium: 136 mmol/L (ref 135–145)

## 2021-02-20 LAB — HEMOGLOBIN AND HEMATOCRIT, BLOOD
HCT: 29.9 % — ABNORMAL LOW (ref 36.0–46.0)
Hemoglobin: 9.4 g/dL — ABNORMAL LOW (ref 12.0–15.0)

## 2021-02-21 NOTE — Patient Instructions (Signed)
See assessment and plan under each diagnosis in the problem list and acutely for this visit 

## 2021-02-21 NOTE — Assessment & Plan Note (Signed)
Continue PT/OT @ SNF as tolerated

## 2021-02-23 DIAGNOSIS — E119 Type 2 diabetes mellitus without complications: Secondary | ICD-10-CM | POA: Diagnosis not present

## 2021-02-23 DIAGNOSIS — H40033 Anatomical narrow angle, bilateral: Secondary | ICD-10-CM | POA: Diagnosis not present

## 2021-02-24 ENCOUNTER — Encounter (HOSPITAL_COMMUNITY)
Admission: RE | Admit: 2021-02-24 | Discharge: 2021-02-24 | Disposition: A | Discharge status: Home / Self Care | Payer: Medicare HMO | Source: Skilled Nursing Facility | Attending: Adult Health | Admitting: Adult Health

## 2021-02-24 DIAGNOSIS — D649 Anemia, unspecified: Secondary | ICD-10-CM | POA: Insufficient documentation

## 2021-02-24 LAB — BASIC METABOLIC PANEL
Anion gap: 10 (ref 5–15)
BUN: 62 mg/dL — ABNORMAL HIGH (ref 8–23)
CO2: 21 mmol/L — ABNORMAL LOW (ref 22–32)
Calcium: 9 mg/dL (ref 8.9–10.3)
Chloride: 102 mmol/L (ref 98–111)
Creatinine, Ser: 1.13 mg/dL — ABNORMAL HIGH (ref 0.44–1.00)
GFR, Estimated: 49 mL/min — ABNORMAL LOW (ref >60)
Glucose, Bld: 175 mg/dL — ABNORMAL HIGH (ref 70–99)
Potassium: 4.5 mmol/L (ref 3.5–5.1)
Sodium: 133 mmol/L — ABNORMAL LOW (ref 135–145)

## 2021-02-26 DIAGNOSIS — Z20822 Contact with and (suspected) exposure to covid-19: Secondary | ICD-10-CM | POA: Diagnosis not present

## 2021-02-27 ENCOUNTER — Non-Acute Institutional Stay (SKILLED_NURSING_FACILITY): Payer: Medicare HMO | Admitting: Adult Health

## 2021-02-27 ENCOUNTER — Encounter: Payer: Self-pay | Admitting: Adult Health

## 2021-02-27 DIAGNOSIS — S14124S Central cord syndrome at C4 level of cervical spinal cord, sequela: Secondary | ICD-10-CM

## 2021-02-27 DIAGNOSIS — M87051 Idiopathic aseptic necrosis of right femur: Secondary | ICD-10-CM

## 2021-02-27 DIAGNOSIS — I700 Atherosclerosis of aorta: Secondary | ICD-10-CM | POA: Diagnosis not present

## 2021-02-27 DIAGNOSIS — G8252 Quadriplegia, C1-C4 incomplete: Secondary | ICD-10-CM

## 2021-02-27 DIAGNOSIS — M87052 Idiopathic aseptic necrosis of left femur: Secondary | ICD-10-CM

## 2021-02-27 NOTE — Progress Notes (Signed)
Location:  [place] Room Number: 133-P Place of Service:  SNF (31)   CODE STATUS: DNR  Allergies  Allergen Reactions  . Codeine     Chief Complaint  Patient presents with  . Acute Visit    Care plan meeting.    HPI:  We have come together for her care plan meeting. Family present. BIMS 15/15 mood 6/30: poor sleep; decreased appetite and energy;anxious.  She is nonambulatory and dependent for transfers. She is dependent for her adls care. She requires assist with eating. She is incontinent of bowel; has foley. There have been no falls; her cbg readings are stable. Therapy: is out of bed 2-3 hours daily; is receiving 2-3 hours therapy daily; is easily exhausted. Her weight is stable at 154 pounds. There are no reports of uncontrolled pain. She continues to be followed for her chronic illnesses including: Aortic atherosclerosis  Quadriplegia C1-4 incomplete  Central cord syndrome at C4 level of cervical spine sequela  Avascular necrosis of both hips  Past Medical History:  Diagnosis Date  . Diabetes mellitus without complication ([place])   . High cholesterol   . Neck pain   . Neuropathy     Past Surgical History:  Procedure Laterality Date  . ABDOMINAL HYSTERECTOMY    . ANTERIOR CERVICAL DECOMP/DISCECTOMY FUSION N/A 02/05/2021   Procedure: Cervical Three-Four  Anterior cervical decompression/discectomy/fusion;  Surgeon: Ashok Pall, MD;  Location: [place];  Service: Neurosurgery;  Laterality: N/A;  RM 20  . APPENDECTOMY    . BACK SURGERY    . CERVICAL DISC SURGERY    . HAND SURGERY    . KNEE SURGERY      Social History   Socioeconomic History  . Marital status: Widowed    Spouse name: Not on file  . Number of children: Not on file  . Years of education: Not on file  . Highest education level: Not on file  Occupational History  . Not on file  Tobacco Use  . Smoking status: Never  . Smokeless tobacco: Never  Vaping Use  . Vaping Use: Never used   Substance and Sexual Activity  . Alcohol use: Never  . Drug use: Never  . Sexual activity: Never  Other Topics Concern  . Not on file  Social History Narrative  . Not on file   Social Determinants of Health   Financial Resource Strain: Not on file  Food Insecurity: Not on file  Transportation Needs: Not on file  Physical Activity: Not on file  Stress: Not on file  Social Connections: Not on file  Intimate Partner Violence: Not on file   Family History  Problem Relation Age of Onset  . Cardiomyopathy Father   . Cancer Mother       VITAL SIGNS BP 130/65   Pulse 68   Temp (!) 97.1 F (36.2 C)   Resp 18   Ht '5\' 2"'$  (1.575 m)   Wt 153 lb 9.6 oz (69.7 kg)   SpO2 95%   BMI 28.09 kg/m   Outpatient Encounter Medications as of 02/27/2021  Medication Sig  . acetaminophen (TYLENOL) 325 MG tablet Take 2 tablets (650 mg total) by mouth every 6 (six) hours.  Marland Kitchen albuterol (VENTOLIN HFA) 108 (90 Base) MCG/ACT inhaler Inhale 1 puff into the lungs every 4 (four) hours as needed for wheezing or shortness of breath.  . ALPRAZolam (XANAX) 0.25 MG tablet Take 0.25 mg by mouth 3 (three) times daily as needed for anxiety. As  needed for increased agitation, restlessness, and anxiety.  . Amino Acids-Protein Hydrolys (FEEDING SUPPLEMENT, PRO-STAT SUGAR FREE 64,) LIQD Take 30 mLs by mouth 3 (three) times daily with meals.  . Artificial Saliva (BIOTENE MOISTURIZING MOUTH MT) Use as directed in the mouth or throat. 1 spray; mucous membrane For Dry Mouth, Three times a day  . aspirin 325 MG tablet Take 325 mg by mouth daily.  Marland Kitchen atorvastatin (LIPITOR) 20 MG tablet Take 1 tablet (20 mg total) by mouth every evening.  . chlorthalidone (HYGROTON) 25 MG tablet Take 25 mg by mouth daily.  Marland Kitchen gabapentin (NEURONTIN) 300 MG capsule Take 1 capsule (300 mg total) by mouth 2 (two) times daily.  . insulin glargine (LANTUS) 100 UNIT/ML injection Inject 10 Units into the skin daily.  . mirtazapine (REMERON) 15 MG  tablet Take 15 mg by mouth at bedtime.  . NON FORMULARY Diet: NAS, Cons CHO  . omeprazole (PRILOSEC) 40 MG capsule Take 40 mg by mouth daily.  Marland Kitchen tiZANidine (ZANAFLEX) 4 MG tablet Take 1 tablet (4 mg total) by mouth every 6 (six) hours as needed for muscle spasms.   No facility-administered encounter medications on file as of 02/27/2021.     SIGNIFICANT DIAGNOSTIC EXAMS   PREVIOUS   12-02-20; ct of head: No acute intracranial abnormality. Polypoid sinus disease.  12-02-20: ct of cervical spine:  Postoperative and degenerative changes in the cervical spine. No acute bony abnormality.  12-02-20; ct of pelvis:  1. No evidence of acute fracture or dislocation of the pelvis or hips. 2. 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 3. Degenerative changes in the hips with subcortical cysts on the left hip.  12-02-20: ct of left knee:  1. Subtle acute nondisplaced fracture through the medial aspect of the head of the fibula. 2. 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 3. There is a small joint effusion.  12-02-20: mri left hip:  1. No acute findings involving the left hip. 2. Mild chronic bilateral hip avascular necrosis. Moderate degenerative hip findings bilaterally. No hip effusion or regional bursitis. 3. Mild left greater than right hamstring tendinopathy. 4. 1.3 cm left eccentric Bartholin's cyst or Gartner cyst.  12-02-20: mri lumbar spine:  1. 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 2. L3-4: Bilateral posterolateral disc bulges, more prominent on the left. Mild left foraminal encroachment that could possibly affect the left L3 nerve. Moderate to severe bilateral facet osteoarthritis. These findings could relate to back pain or referred facet syndrome pain. 3. L4-5: Bilateral facet arthropathy with 2 mm of anterolisthesis. Bulging of the disc more prominent towards the right. Mild narrowing of the lateral  recesses and of the intervertebral foramen on the right, but without visible neural compression. Findings at this level could relate to back pain or referred facet syndrome pain. 4. L5-S1: Previous left hemilaminectomy. Endplate osteophytes and bulging of the disc more prominent towards the left. Facet degeneration and hypertrophy left more than right with left foraminal stenosis and subarticular lateral recess stenosis could cause left-sided neural compression.   01-25-21: ct of head; maxillofacial cervical spine:  1. No acute intracranial pathology. 2. No acute/traumatic cervical spine pathology. 3. No acute facial bone fractures.  01-26-21: ct of chest abdomen and pelvis:  1. No noncontrast CT evidence of acute traumatic injury to the chest, abdomen, or pelvis. 2. Distended urinary bladder. Mild bilateral hydronephrosis and hydroureter to the bilateral ureterovesicular junctions. No obstructing calculus or other etiology identified. Correlate for urinary retention. 3.  No fracture or dislocation of the lumbar spine. Moderate disc space height loss and osteophytosis at L5-S1. Probable small broad-based posterior disc bulges at L4-L5 and L5-S1. 4. Coronary artery disease. Aortic Atherosclerosis  01-26-21: ct of right shoulder:  1.  No acute osseous injury of the right shoulder. 2. Mild mineralization in the infraspinatus tendon as can be seen with calcific tendinosis.  01-29-21: MRI of spine:  Postoperative and multilevel degenerative changes of the cervical spine. There is severe canal stenosis with cord compression at C3-C4. Abnormal cord signal is present just below this level (at operative levels) and may reflect edema or myelomalacia No significant degenerative changes of the thoracic spine. Multilevel degenerative changes of lumbar spine similar to recent prior study.  01-30-21: renal ultrasound:  1. No acute abnormality. Small amount of right perinephric fluid, nonspecific.  02-05-21: chest  x-ray:  1. Right internal jugular central venous catheter with tip over the mid SVC. No pneumothorax. 2. Hazy lung base opacities likely represent combination of atelectasis and pleural fluid. Vascular congestion.    NO NEW EXAMS.    LABS REVIEWED PREVIOUS   12-02-20: wbc 6.4; hgb 11.3; hct 34.4; mcv 93.0 plt 216; glucose 209; bun 33; creat 1.13; k+ 4.3; na++ 137; ca 9.0; GFR 49; hgb a1c 8.6 12-03-20: wbc 6.5; hgb 9.8; hct 30.5 mcv 93.8 plt 182; glucose 188;bun 41; creat 1.49; k+ 4.4; na++ 134; ca 8.2; GFR 35 12-04-20; glucose 285; bun 29; creat 1.00; k+ 4.3; na++ 138; ca 8.7 GFR 56  01-25-21: wbc 6.4; hgb 10.6; hct 31.7; mcv 91.1 plt 227; glucose 152; bun 40; creat 1.2 k+ 4.2; na++ 135; ca 8.7; GFR 42; liver normal albumin 3.5 01-29-21: urine and blood culture: klebsiella pneumoniae 01-31-21: wbc 13.1; hgb 8.3; hct 23.6; mcv 88.1 plt 153; glucose 162; bun 67; creat 2.77; k+ 4.8; na++ 125; ca 7.5; GFR 17; ast 62; alt 57; albumin 1.7 02-04-21: wbc 9.8; hgb 7.6; hct 22.0; mcv 85.3 plt 212; glucose 161 bun 40; creat 1.40; k+ 3.4; na++ 127; ca 7.5; GFR 38; ast 62; alt 57; albumin 1.7  02-08-21: wbc 10.4; hgb 9.2; hct 27.6; mcv 87.3 plt 433; glucose 186; bun 23; creat 1.00 ;k+ 4.4; na++ 131; ca 8.1; GFR 56; ast 60; alt 61; albumin 2.1   NO NEW LABS.   Review of Systems  Constitutional:  Negative for malaise/fatigue.  Respiratory:  Negative for cough and shortness of breath.   Cardiovascular:  Negative for chest pain, palpitations and leg swelling.  Gastrointestinal:  Negative for abdominal pain, constipation and heartburn.  Musculoskeletal:  Negative for back pain, joint pain and myalgias.  Skin: Negative.   Neurological:  Positive for weakness. Negative for dizziness.       Left >right   Psychiatric/Behavioral:  The patient is not nervous/anxious.    Physical Exam Constitutional:      General: She is not in acute distress.    Appearance: She is well-developed. She is not diaphoretic.  Neck:      Thyroid: No thyromegaly.  Cardiovascular:     Rate and Rhythm: Normal rate and regular rhythm.     Heart sounds: Normal heart sounds.  Pulmonary:     Effort: Pulmonary effort is normal. No respiratory distress.     Breath sounds: Normal breath sounds.  Abdominal:     General: Bowel sounds are normal. There is no distension.     Palpations: Abdomen is soft.     Tenderness: There is no abdominal tenderness.  Genitourinary:  Comments: Foley  Musculoskeletal:     Cervical back: Neck supple.     Right lower leg: No edema.     Left lower leg: No edema.     Comments: Anterior neck incision line with bruising present. Has little movement in bilateral lower extremities with left worse than right Is able to touch head with right arm; unable to with left arm Has bilateral hand weakness. Limited range of motion to neck     Lymphadenopathy:     Cervical: No cervical adenopathy.  Skin:    General: Skin is warm and dry.  Neurological:     Mental Status: She is alert and oriented to person, place, and time.  Psychiatric:        Mood and Affect: Mood normal.     ASSESSMENT/ PLAN:  TODAY  Aortic atherosclerosis Quadriplegia C1-4 incomplete Central cord syndrome at C4 level of cervical spine sequela Avascular necrosis of both hips   Will continue current plan of care Will continue therapy as directed Will continue current medications Her family is applying for medicaid for probable long term placement  Time spent with patient: 45 minutes: care plan; dietary and therapy needs.    [name] NP [facility]  Contact [phone] Monday through Friday 8am- 5pm  After hours call [phone]

## 2021-03-06 ENCOUNTER — Encounter (HOSPITAL_COMMUNITY)
Admission: RE | Admit: 2021-03-06 | Discharge: 2021-03-06 | Disposition: A | Discharge status: Home / Self Care | Payer: Medicare HMO | Source: Skilled Nursing Facility | Attending: Adult Health | Admitting: Adult Health

## 2021-03-06 DIAGNOSIS — U071 COVID-19: Secondary | ICD-10-CM | POA: Insufficient documentation

## 2021-03-06 DIAGNOSIS — R918 Other nonspecific abnormal finding of lung field: Secondary | ICD-10-CM | POA: Diagnosis not present

## 2021-03-06 LAB — CBC
HCT: 27.1 % — ABNORMAL LOW (ref 36.0–46.0)
Hemoglobin: 8.8 g/dL — ABNORMAL LOW (ref 12.0–15.0)
MCH: 30.3 pg (ref 26.0–34.0)
MCHC: 32.5 g/dL (ref 30.0–36.0)
MCV: 93.4 fL (ref 80.0–100.0)
Platelets: 262 10*3/uL (ref 150–400)
RBC: 2.9 MIL/uL — ABNORMAL LOW (ref 3.87–5.11)
RDW: 15.6 % — ABNORMAL HIGH (ref 11.5–15.5)
WBC: 5.3 10*3/uL (ref 4.0–10.5)
nRBC: 0 % (ref 0.0–0.2)

## 2021-03-06 LAB — BASIC METABOLIC PANEL
Anion gap: 9 (ref 5–15)
BUN: 66 mg/dL — ABNORMAL HIGH (ref 8–23)
CO2: 21 mmol/L — ABNORMAL LOW (ref 22–32)
Calcium: 9 mg/dL (ref 8.9–10.3)
Chloride: 105 mmol/L (ref 98–111)
Creatinine, Ser: 1.3 mg/dL — ABNORMAL HIGH (ref 0.44–1.00)
GFR, Estimated: 41 mL/min — ABNORMAL LOW (ref >60)
Glucose, Bld: 453 mg/dL — ABNORMAL HIGH (ref 70–99)
Potassium: 4.6 mmol/L (ref 3.5–5.1)
Sodium: 135 mmol/L (ref 135–145)

## 2021-03-06 LAB — C-REACTIVE PROTEIN: CRP: 3 mg/dL — ABNORMAL HIGH (ref <1.0)

## 2021-03-06 LAB — D-DIMER, QUANTITATIVE: D-Dimer, Quant: 2.16 ug/mL-FEU — ABNORMAL HIGH (ref 0.00–0.50)

## 2021-03-07 ENCOUNTER — Non-Acute Institutional Stay (SKILLED_NURSING_FACILITY): Payer: Medicare HMO | Admitting: Adult Health

## 2021-03-07 ENCOUNTER — Encounter: Payer: Self-pay | Admitting: Adult Health

## 2021-03-07 DIAGNOSIS — E119 Type 2 diabetes mellitus without complications: Secondary | ICD-10-CM

## 2021-03-07 DIAGNOSIS — U071 COVID-19: Secondary | ICD-10-CM | POA: Diagnosis not present

## 2021-03-07 DIAGNOSIS — N1831 Chronic kidney disease, stage 3a: Secondary | ICD-10-CM

## 2021-03-07 DIAGNOSIS — J1282 Pneumonia due to coronavirus disease 2019: Secondary | ICD-10-CM | POA: Diagnosis not present

## 2021-03-07 DIAGNOSIS — E1122 Type 2 diabetes mellitus with diabetic chronic kidney disease: Secondary | ICD-10-CM | POA: Diagnosis not present

## 2021-03-07 DIAGNOSIS — Z794 Long term (current) use of insulin: Secondary | ICD-10-CM

## 2021-03-07 NOTE — Progress Notes (Signed)
Location:  [facility] Room Number: 119-P Place of Service:  SNF (31)   CODE STATUS: DNR  Allergies  Allergen Reactions  . Codeine     Chief Complaint  Patient presents with  . Acute Visit    Covid positive     HPI:  She has tested positive for covid. There are no reports of fevers present. She denies any worsening cough; no shortness of breath. No loss of taste or smell. Sh eis on antiviral medications. Her cbg readings are all very elevated up to the 300's. She is presnetly taking lantus 10 units nightly. Her d-dimer is elevated and will need to treated with eliquis.her chest x-ray demonstrated left lobe pneumonia.   Past Medical History:  Diagnosis Date  . Diabetes mellitus without complication ([place])   . High cholesterol   . Neck pain   . Neuropathy     Past Surgical History:  Procedure Laterality Date  . ABDOMINAL HYSTERECTOMY    . ANTERIOR CERVICAL DECOMP/DISCECTOMY FUSION N/A 02/05/2021   Procedure: Cervical Three-Four  Anterior cervical decompression/discectomy/fusion;  Surgeon: Ashok Pall, MD;  Location: [facility];  Service: Neurosurgery;  Laterality: N/A;  RM 20  . APPENDECTOMY    . BACK SURGERY    . CERVICAL DISC SURGERY    . HAND SURGERY    . KNEE SURGERY      Social History   Socioeconomic History  . Marital status: Widowed    Spouse name: Not on file  . Number of children: Not on file  . Years of education: Not on file  . Highest education level: Not on file  Occupational History  . Not on file  Tobacco Use  . Smoking status: Never  . Smokeless tobacco: Never  Vaping Use  . Vaping Use: Never used  Substance and Sexual Activity  . Alcohol use: Never  . Drug use: Never  . Sexual activity: Never  Other Topics Concern  . Not on file  Social History Narrative  . Not on file   Social Determinants of Health   Financial Resource Strain: Not on file  Food Insecurity: Not on file  Transportation Needs: Not on file  Physical  Activity: Not on file  Stress: Not on file  Social Connections: Not on file  Intimate Partner Violence: Not on file   Family History  Problem Relation Age of Onset  . Cardiomyopathy Father   . Cancer Mother       VITAL SIGNS BP 97/61   Pulse 92   Temp 98.5 F (36.9 C)   Resp 18   Ht '5\' 2"'$  (1.575 m)   Wt 153 lb 12.8 oz (69.8 kg)   SpO2 92%   BMI 28.13 kg/m   Outpatient Encounter Medications as of 03/07/2021  Medication Sig  . acetaminophen (TYLENOL) 325 MG tablet Take 2 tablets (650 mg total) by mouth every 6 (six) hours.  Marland Kitchen albuterol (VENTOLIN HFA) 108 (90 Base) MCG/ACT inhaler Inhale 1 puff into the lungs every 4 (four) hours as needed for wheezing or shortness of breath.  . ALPRAZolam (XANAX) 0.25 MG tablet Take 0.25 mg by mouth 3 (three) times daily as needed for anxiety. As needed for increased agitation, restlessness, and anxiety.  . Amino Acids-Protein Hydrolys (FEEDING SUPPLEMENT, PRO-STAT SUGAR FREE 64,) LIQD Take 30 mLs by mouth 3 (three) times daily with meals.  Marland Kitchen apixaban (ELIQUIS) 2.5 MG TABS tablet Take 2.5 mg by mouth 2 (two) times daily. for elevated D-dimer  . Artificial  Saliva (BIOTENE MOISTURIZING MOUTH MT) Use as directed in the mouth or throat. 1 spray; mucous membrane For Dry Mouth, Three times a day  . aspirin 325 MG tablet Take 325 mg by mouth daily.  Marland Kitchen atorvastatin (LIPITOR) 20 MG tablet Take 1 tablet (20 mg total) by mouth every evening.  . chlorthalidone (HYGROTON) 25 MG tablet Take 25 mg by mouth daily.  Marland Kitchen doxycycline (VIBRAMYCIN) 100 MG capsule Take 100 mg by mouth 2 (two) times daily. pneumonia  . Ergocalciferol (VITAMIN D2 PO) Take by mouth. 1,250 mcg (50,000 unit); oral Special Instructions: Give once a week x 2 doses  . gabapentin (NEURONTIN) 300 MG capsule Take 1 capsule (300 mg total) by mouth 2 (two) times daily.  . insulin glargine (LANTUS) 100 UNIT/ML injection Inject 10 Units into the skin daily.  . insulin lispro (HUMALOG KWIKPEN) 100  UNIT/ML KwikPen Inject into the skin. 100 unit/mL; amt: 5 units; subcutaneous  . melatonin 3 MG TABS tablet Take 3 mg by mouth at bedtime.  . mirtazapine (REMERON) 15 MG tablet Take 15 mg by mouth at bedtime.  . Molnupiravir 200 MG CAPS Take 800 mg by mouth. Special Instructions: x 5 days covid pos Twice A Day  . Multiple Vitamins-Minerals (ZINC) LOZG Take by mouth. Lozenges (vit a-vit c-zinc-propolis) lozenge; oral Special Instructions: Give 5x/day x 2 weeks  . NON FORMULARY Diet: NAS, Cons CHO  . omeprazole (PRILOSEC) 40 MG capsule Take 40 mg by mouth daily.  Marland Kitchen tiZANidine (ZANAFLEX) 4 MG tablet Take 1 tablet (4 mg total) by mouth every 6 (six) hours as needed for muscle spasms.  . vitamin C (ASCORBIC ACID) 500 MG tablet Take 500 mg by mouth 2 (two) times daily.   No facility-administered encounter medications on file as of 03/07/2021.     SIGNIFICANT DIAGNOSTIC EXAMS   PREVIOUS   12-02-20; ct of head: No acute intracranial abnormality. Polypoid sinus disease.  12-02-20: ct of cervical spine:  Postoperative and degenerative changes in the cervical spine. No acute bony abnormality.  12-02-20; ct of pelvis:  1. No evidence of acute fracture or dislocation of the pelvis or hips. 2. 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 3. Degenerative changes in the hips with subcortical cysts on the left hip.  12-02-20: ct of left knee:  1. Subtle acute nondisplaced fracture through the medial aspect of the head of the fibula. 2. 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 3. There is a small joint effusion.  12-02-20: mri left hip:  1. No acute findings involving the left hip. 2. Mild chronic bilateral hip avascular necrosis. Moderate degenerative hip findings bilaterally. No hip effusion or regional bursitis. 3. Mild left greater than right hamstring tendinopathy. 4. 1.3 cm left eccentric Bartholin's cyst or Gartner  cyst.  12-02-20: mri lumbar spine:  1. 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 2. L3-4: Bilateral posterolateral disc bulges, more prominent on the left. Mild left foraminal encroachment that could possibly affect the left L3 nerve. Moderate to severe bilateral facet osteoarthritis. These findings could relate to back pain or referred facet syndrome pain. 3. L4-5: Bilateral facet arthropathy with 2 mm of anterolisthesis. Bulging of the disc more prominent towards the right. Mild narrowing of the lateral recesses and of the intervertebral foramen on the right, but without visible neural compression. Findings at this level could relate to back pain or referred facet syndrome pain. 4. L5-S1: Previous left hemilaminectomy. Endplate osteophytes and bulging of the disc more prominent towards  the left. Facet degeneration and hypertrophy left more than right with left foraminal stenosis and subarticular lateral recess stenosis could cause left-sided neural compression.   01-25-21: ct of head; maxillofacial cervical spine:  1. No acute intracranial pathology. 2. No acute/traumatic cervical spine pathology. 3. No acute facial bone fractures.  01-26-21: ct of chest abdomen and pelvis:  1. No noncontrast CT evidence of acute traumatic injury to the chest, abdomen, or pelvis. 2. Distended urinary bladder. Mild bilateral hydronephrosis and hydroureter to the bilateral ureterovesicular junctions. No obstructing calculus or other etiology identified. Correlate for urinary retention. 3. No fracture or dislocation of the lumbar spine. Moderate disc space height loss and osteophytosis at L5-S1. Probable small broad-based posterior disc bulges at L4-L5 and L5-S1. 4. Coronary artery disease. Aortic Atherosclerosis  01-26-21: ct of right shoulder:  1.  No acute osseous injury of the right shoulder. 2. Mild mineralization in the infraspinatus tendon as can be seen with calcific tendinosis.  01-29-21: MRI  of spine:  Postoperative and multilevel degenerative changes of the cervical spine. There is severe canal stenosis with cord compression at C3-C4. Abnormal cord signal is present just below this level (at operative levels) and may reflect edema or myelomalacia No significant degenerative changes of the thoracic spine. Multilevel degenerative changes of lumbar spine similar to recent prior study.  01-30-21: renal ultrasound:  1. No acute abnormality. Small amount of right perinephric fluid, nonspecific.  02-05-21: chest x-ray:  1. Right internal jugular central venous catheter with tip over the mid SVC. No pneumothorax. 2. Hazy lung base opacities likely represent combination of atelectasis and pleural fluid. Vascular congestion.    TODAY  03-06-21 chest x-ray: left lower lobe infiltrate likely representing pneumonia    LABS REVIEWED PREVIOUS   12-02-20: wbc 6.4; hgb 11.3; hct 34.4; mcv 93.0 plt 216; glucose 209; bun 33; creat 1.13; k+ 4.3; na++ 137; ca 9.0; GFR 49; hgb a1c 8.6 12-03-20: wbc 6.5; hgb 9.8; hct 30.5 mcv 93.8 plt 182; glucose 188;bun 41; creat 1.49; k+ 4.4; na++ 134; ca 8.2; GFR 35 12-04-20; glucose 285; bun 29; creat 1.00; k+ 4.3; na++ 138; ca 8.7 GFR 56  01-25-21: wbc 6.4; hgb 10.6; hct 31.7; mcv 91.1 plt 227; glucose 152; bun 40; creat 1.2 k+ 4.2; na++ 135; ca 8.7; GFR 42; liver normal albumin 3.5 01-29-21: urine and blood culture: klebsiella pneumoniae 01-31-21: wbc 13.1; hgb 8.3; hct 23.6; mcv 88.1 plt 153; glucose 162; bun 67; creat 2.77; k+ 4.8; na++ 125; ca 7.5; GFR 17; ast 62; alt 57; albumin 1.7 02-04-21: wbc 9.8; hgb 7.6; hct 22.0; mcv 85.3 plt 212; glucose 161 bun 40; creat 1.40; k+ 3.4; na++ 127; ca 7.5; GFR 38; ast 62; alt 57; albumin 1.7  02-08-21: wbc 10.4; hgb 9.2; hct 27.6; mcv 87.3 plt 433; glucose 186; bun 23; creat 1.00 ;k+ 4.4; na++ 131; ca 8.1; GFR 56; ast 60; alt 61; albumin 2.1   TODAY  02-20-21: hgb 9.4; hct 29.9 glucose 154; bun 51; creat 1.17; k+ 4.5; na++ 136; ca  8.9; GFR 47 02-24-21: glucose 175; bun 62; creat 1.13; k+ 4.5; na++ 133; ca 9.0 GFR 49 03-06-21: wbc 5.3; hgb 8.8; hct 27.1; mcv 93.4 plt 262; glucose 453; bun 66; creat 1.30; k+ 4.6; na++ 135; ca 9.0; GFR 41; d-dimer: 2.16; CRP 3.0    Review of Systems  Constitutional:  Negative for malaise/fatigue.  Respiratory:  Negative for cough and shortness of breath.   Cardiovascular:  Negative for chest pain, palpitations and  leg swelling.  Gastrointestinal:  Negative for abdominal pain, constipation and heartburn.  Musculoskeletal:  Negative for back pain, joint pain and myalgias.  Skin: Negative.   Neurological:  Positive for weakness. Negative for dizziness.  Psychiatric/Behavioral:  The patient is not nervous/anxious.     Physical Exam Constitutional:      General: She is not in acute distress.    Appearance: She is well-developed. She is not diaphoretic.  Neck:     Thyroid: No thyromegaly.  Cardiovascular:     Rate and Rhythm: Normal rate and regular rhythm.     Heart sounds: Normal heart sounds.  Pulmonary:     Effort: Pulmonary effort is normal. No respiratory distress.     Breath sounds: Rhonchi present.  Abdominal:     General: Bowel sounds are normal. There is no distension.     Palpations: Abdomen is soft.     Tenderness: There is no abdominal tenderness.  Genitourinary:    Comments: Foley  Musculoskeletal:     Cervical back: Neck supple.     Right lower leg: No edema.     Left lower leg: No edema.     Comments: Has little movement in bilateral lower extremities with left worse than right Is able to move bilateral upper extremities to be able to feed herself some food items.  Has bilateral hand weakness. Limited range of motion to neck  Lymphadenopathy:     Cervical: No cervical adenopathy.  Skin:    General: Skin is warm and dry.  Neurological:     Mental Status: She is alert and oriented to person, place, and time.  Psychiatric:        Mood and Affect: Mood normal.        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 Will chck cbg three times daily  Will monitor her status   [name] NP [facility]  Contact [phone] Monday through Friday 8am- 5pm  After hours call [phone]

## 2021-03-12 ENCOUNTER — Ambulatory Visit: Payer: Medicare HMO | Admitting: Urology

## 2021-03-12 DIAGNOSIS — N184 Chronic kidney disease, stage 4 (severe): Secondary | ICD-10-CM | POA: Insufficient documentation

## 2021-03-12 DIAGNOSIS — U071 COVID-19: Secondary | ICD-10-CM | POA: Insufficient documentation

## 2021-03-12 DIAGNOSIS — J1282 Pneumonia due to coronavirus disease 2019: Secondary | ICD-10-CM | POA: Insufficient documentation

## 2021-03-12 DIAGNOSIS — E1122 Type 2 diabetes mellitus with diabetic chronic kidney disease: Secondary | ICD-10-CM | POA: Insufficient documentation

## 2021-03-13 ENCOUNTER — Other Ambulatory Visit (HOSPITAL_COMMUNITY)
Admission: RE | Admit: 2021-03-13 | Discharge: 2021-03-13 | Disposition: A | Discharge status: Home / Self Care | Payer: Medicare HMO | Source: Skilled Nursing Facility | Attending: Adult Health | Admitting: Adult Health

## 2021-03-13 DIAGNOSIS — E1122 Type 2 diabetes mellitus with diabetic chronic kidney disease: Secondary | ICD-10-CM | POA: Insufficient documentation

## 2021-03-13 LAB — D-DIMER, QUANTITATIVE: D-Dimer, Quant: 1.49 ug/mL-FEU — ABNORMAL HIGH (ref 0.00–0.50)

## 2021-03-18 ENCOUNTER — Non-Acute Institutional Stay (SKILLED_NURSING_FACILITY): Payer: Medicare HMO | Admitting: Adult Health

## 2021-03-18 ENCOUNTER — Encounter: Payer: Self-pay | Admitting: Adult Health

## 2021-03-18 DIAGNOSIS — Z0000 Encounter for general adult medical examination without abnormal findings: Secondary | ICD-10-CM

## 2021-03-18 NOTE — Patient Instructions (Signed)
  [name] , Thank you for taking time to come for your Medicare Wellness Visit. I appreciate your ongoing commitment to your health goals. Please review the following plan we discussed and let me know if I can assist you in the future.   These are the goals we discussed:  Goals      Absence of Fall and Fall-Related Injury     Evidence-based guidance:  Assess fall risk using a validated tool when available. Consider balance and gait impairment, muscle weakness, diminished vision or hearing, environmental hazards, presence of urinary or bowel urgency and/or incontinence.  Communicate fall injury risk to interprofessional healthcare team.  Develop a fall prevention plan with the patient and family.  Promote use of personal vision and auditory aids.  Promote reorientation, appropriate sensory stimulation, and routines to decrease risk of fall when changes in mental status are present.  Assess assistance level required for safe and effective self-care; consider referral for home care.  Encourage physical activity, such as performance of self-care at highest level of ability, strength and balance exercise program, and provision of appropriate assistive devices; refer to rehabilitation therapy.  Refer to community-based fall prevention program where available.  If fall occurs, determine the cause and revise fall injury prevention plan.  Regularly review medication contribution to fall risk; consider risk related to polypharmacy and age.  Refer to pharmacist for consultation when concerns about medications are revealed.  Balance adequate pain management with potential for oversedation.  Provide guidance related to environmental modifications.  Consider supplementation with Vitamin D.   Notes:      DIET - INCREASE WATER INTAKE     General - Client will not be readmitted within 30 days (C-SNP)        This is a list of the screening recommended for you and due dates:  Health Maintenance   Topic Date Due   Complete foot exam   Never done   Eye exam for diabetics  Never done   Urine Protein Check  Never done   Zoster (Shingles) Vaccine (1 of 2) Never done   DEXA scan (bone density measurement)  Never done   COVID-19 Vaccine (4 - Booster for Moderna series) 11/21/2020   Flu Shot  03/31/2021   Hemoglobin A1C  06/03/2021   Tetanus Vaccine  01/26/2031   Pneumonia vaccines  Completed   HPV Vaccine  Aged Out

## 2021-03-18 NOTE — Progress Notes (Signed)
Subjective:   [name] is a 85 y.o. female who presents for Medicare Annual (Subsequent) preventive examination.  Review of Systems    Review of Systems  Constitutional:  Negative for malaise/fatigue.  Respiratory:  Negative for cough and shortness of breath.   Cardiovascular:  Negative for chest pain, palpitations and leg swelling.  Gastrointestinal:  Negative for abdominal pain, constipation and heartburn.  Musculoskeletal:  Negative for back pain, joint pain and myalgias.  Skin: Negative.   Neurological:  Negative for dizziness.  Psychiatric/Behavioral:  The patient is not nervous/anxious.    Cardiac Risk Factors include: advanced age (>51mn, >>20women);diabetes mellitus;dyslipidemia;hypertension;sedentary lifestyle     Objective:    Today's Vitals   03/18/21 1229 03/18/21 1255  BP: (!) 124/45   Pulse: 69   Resp: 20   Temp: (!) 97.1 F (36.2 C)   SpO2: 91%   Weight: 153 lb 12.8 oz (69.8 kg)   Height: '5\' 2"'$  (1.575 m)   PainSc:  3    Body mass index is 28.13 kg/m.  Advanced Directives 03/18/2021 03/07/2021 02/27/2021 02/18/2021 02/17/2021 02/05/2021 01/30/2021  Does Patient Have a Medical Advance Directive? Yes Yes Yes Yes Yes No No  Type of Advance Directive Out of facility DNR (pink MOST or yellow form) Out of facility DNR (pink MOST or yellow form) Out of facility DNR (pink MOST or yellow form) Out of facility DNR (pink MOST or yellow form) Out of facility DNR (pink MOST or yellow form) - -  Does patient want to make changes to medical advance directive? No - Patient declined No - Patient declined No - Patient declined No - Patient declined No - Patient declined - -  Would patient like information on creating a medical advance directive? - - - - - No - Patient declined No - Patient declined    Current Medications (verified) Outpatient Encounter Medications as of 03/18/2021  Medication Sig  . acetaminophen (TYLENOL) 325 MG tablet Take 2 tablets (650 mg total) by mouth  every 6 (six) hours.  .Marland Kitchenalbuterol (VENTOLIN HFA) 108 (90 Base) MCG/ACT inhaler Inhale 1 puff into the lungs every 4 (four) hours as needed for wheezing or shortness of breath.  . Amino Acids-Protein Hydrolys (FEEDING SUPPLEMENT, PRO-STAT SUGAR FREE 64,) LIQD Take 30 mLs by mouth 3 (three) times daily with meals.  .Marland Kitchenapixaban (ELIQUIS) 2.5 MG TABS tablet Take 2.5 mg by mouth 2 (two) times daily. for elevated D-dimer  . Artificial Saliva (BIOTENE MOISTURIZING MOUTH MT) Use as directed in the mouth or throat. 1 spray; mucous membrane For Dry Mouth, Three times a day  . aspirin 325 MG tablet Take 325 mg by mouth daily.  .Marland Kitchenatorvastatin (LIPITOR) 20 MG tablet Take 1 tablet (20 mg total) by mouth every evening.  . chlorthalidone (HYGROTON) 25 MG tablet Take 25 mg by mouth daily.  . Ergocalciferol (VITAMIN D2 PO) Take by mouth. 1,250 mcg (50,000 unit); oral Special Instructions: Give once a week x 2 doses  . gabapentin (NEURONTIN) 300 MG capsule Take 1 capsule (300 mg total) by mouth 2 (two) times daily.  . insulin glargine (LANTUS) 100 UNIT/ML injection Inject 20 Units into the skin daily.  . insulin lispro (HUMALOG) 100 UNIT/ML KwikPen Inject 5 Units into the skin 3 (three) times daily with meals.  . melatonin 3 MG TABS tablet Take 3 mg by mouth at bedtime.  . mirtazapine (REMERON) 15 MG tablet Take 15 mg by mouth at bedtime.  . Multiple Vitamins-Minerals (ZINC)  LOZG Take by mouth. Lozenges (vit a-vit c-zinc-propolis) lozenge; oral Special Instructions: Give 5x/day x 2 weeks  . NON FORMULARY Diet: NAS, Cons CHO  . omeprazole (PRILOSEC) 40 MG capsule Take 40 mg by mouth daily.  Marland Kitchen tiZANidine (ZANAFLEX) 4 MG tablet Take 1 tablet (4 mg total) by mouth every 6 (six) hours as needed for muscle spasms.  . vitamin C (ASCORBIC ACID) 500 MG tablet Take 500 mg by mouth 2 (two) times daily.  . [DISCONTINUED] ALPRAZolam (XANAX) 0.25 MG tablet Take 0.25 mg by mouth 3 (three) times daily as needed for anxiety. As  needed for increased agitation, restlessness, and anxiety.  . [DISCONTINUED] doxycycline (VIBRAMYCIN) 100 MG capsule Take 100 mg by mouth 2 (two) times daily. pneumonia  . [DISCONTINUED] Molnupiravir 200 MG CAPS Take 800 mg by mouth. Special Instructions: x 5 days covid pos Twice A Day   No facility-administered encounter medications on file as of 03/18/2021.    Allergies (verified) Codeine   History: Past Medical History:  Diagnosis Date  . Diabetes mellitus without complication ([place])   . High cholesterol   . Neck pain   . Neuropathy    Past Surgical History:  Procedure Laterality Date  . ABDOMINAL HYSTERECTOMY    . ANTERIOR CERVICAL DECOMP/DISCECTOMY FUSION N/A 02/05/2021   Procedure: Cervical Three-Four  Anterior cervical decompression/discectomy/fusion;  Surgeon: Ashok Pall, MD;  Location: [facility];  Service: Neurosurgery;  Laterality: N/A;  RM 20  . APPENDECTOMY    . BACK SURGERY    . CERVICAL DISC SURGERY    . HAND SURGERY    . KNEE SURGERY     Family History  Problem Relation Age of Onset  . Cardiomyopathy Father   . Cancer Mother    Social History   Socioeconomic History  . Marital status: Widowed    Spouse name: Not on file  . Number of children: Not on file  . Years of education: Not on file  . Highest education level: Not on file  Occupational History  . Not on file  Tobacco Use  . Smoking status: Never  . Smokeless tobacco: Never  Vaping Use  . Vaping Use: Never used  Substance and Sexual Activity  . Alcohol use: Never  . Drug use: Never  . Sexual activity: Never  Other Topics Concern  . Not on file  Social History Narrative  . Not on file   Social Determinants of Health   Financial Resource Strain: Not on file  Food Insecurity: Not on file  Transportation Needs: Not on file  Physical Activity: Not on file  Stress: Not on file  Social Connections: Not on file    Tobacco Counseling Counseling given: Not Answered   Clinical  Intake:  Pre-visit preparation completed: Yes  Pain : 0-10 Pain Score: 3  Pain Type: Chronic pain Pain Location: Back Pain Orientation: Lower Pain Descriptors / Indicators: Aching, Sharp Pain Onset: Other (comment) (long term pain) Pain Frequency: Occasional     BMI - recorded: 28.13 Nutritional Status: BMI 25 -29 Overweight Nutritional Risks: Unintentional weight gain Diabetes: Yes CBG done?: Yes CBG resulted in Enter/ Edit results?: Yes (at facility) Did pt. bring in CBG monitor from home?: No  How often do you need to have someone help you when you read instructions, pamphlets, or other written materials from your doctor or pharmacy?: 2 - Rarely  Diabetic?yes  Interpreter Needed?: No      Activities of Daily Living In your present state of health, do you  have any difficulty performing the following activities: 03/18/2021 02/11/2021  Hearing? N -  Vision? N -  Difficulty concentrating or making decisions? Y -  Walking or climbing stairs? Y -  Comment - -  Dressing or bathing? Y -  Doing errands, shopping? Y N  Preparing Food and eating ? Y -  Using the Toilet? Y -  In the past six months, have you accidently leaked urine? Y -  Comment has foley -  Do you have problems with loss of bowel control? Y -  Managing your Medications? Y -  Managing your Finances? Y -  Housekeeping or managing your Housekeeping? Y -  Some recent data might be hidden    Patient Care Team: Burdine, Virgina Evener, MD as PCP - General (Family Medicine)  Indicate any recent Medical Services you may have received from other than [facility] providers in the past year (date may be approximate).     Assessment:   This is a routine wellness examination for [place].  Hearing/Vision screen No results found.  Dietary issues and exercise activities discussed: Current Exercise Habits: The patient does not participate in regular exercise at present, Exercise limited by: Other - see comments   Goals  Addressed             This Visit's Progress   . Absence of Fall and Fall-Related Injury       Evidence-based guidance:  Assess fall risk using a validated tool when available. Consider balance and gait impairment, muscle weakness, diminished vision or hearing, environmental hazards, presence of urinary or bowel urgency and/or incontinence.  Communicate fall injury risk to interprofessional healthcare team.  Develop a fall prevention plan with the patient and family.  Promote use of personal vision and auditory aids.  Promote reorientation, appropriate sensory stimulation, and routines to decrease risk of fall when changes in mental status are present.  Assess assistance level required for safe and effective self-care; consider referral for home care.  Encourage physical activity, such as performance of self-care at highest level of ability, strength and balance exercise program, and provision of appropriate assistive devices; refer to rehabilitation therapy.  Refer to community-based fall prevention program where available.  If fall occurs, determine the cause and revise fall injury prevention plan.  Regularly review medication contribution to fall risk; consider risk related to polypharmacy and age.  Refer to pharmacist for consultation when concerns about medications are revealed.  Balance adequate pain management with potential for oversedation.  Provide guidance related to environmental modifications.  Consider supplementation with Vitamin D.   Notes:     . DIET - INCREASE WATER INTAKE      . General - Client will not be readmitted within 30 days ([facility])         Depression Screen PHQ 2/9 Scores 03/18/2021 06/06/2018 05/04/2018 03/09/2018  PHQ - 2 Score 2 0 3 0  PHQ- 9 Score 4 - 11 -    Fall Risk Fall Risk  03/18/2021 06/06/2018 05/04/2018 03/09/2018  Falls in the past year? 1 No No No  Number falls in past yr: 1 - - -  Injury with Fall? 1 - - -  Risk for fall due to : History of  fall(s);Impaired balance/gait;Impaired mobility - - -  Follow up Falls evaluation completed - - -    FALL RISK PREVENTION PERTAINING TO THE HOME:  Any stairs in or around the home? No  If so, are there any without handrails?  na Home free of loose  throw rugs in walkways, pet beds, electrical cords, etc? No  Adequate lighting in your home to reduce risk of falls? No   ASSISTIVE DEVICES UTILIZED TO PREVENT FALLS:  Life alert? No  Use of a cane, walker or w/c? Yes  Grab bars in the bathroom? Yes  Shower chair or bench in shower? Yes  Elevated toilet seat or a handicapped toilet? Yes   TIMED UP AND GO:  Was the test performed? No .  Length of time to ambulate 10 feet:  sec.   Nonambulatory   Cognitive Function: MMSE - Mini Mental State Exam 03/18/2021  Orientation to time 5  Orientation to Place 5  Registration 3  Attention/ Calculation 4  Recall 3  Language- name 2 objects 2  Language- repeat 1  Language- follow 3 step command 3  Language- read & follow direction 1  Write a sentence 0  Copy design 0  Total score 27     6CIT Screen 03/18/2021  What Year? 0 points  What month? 0 points  What time? 0 points  Count back from 20 2 points  Months in reverse 2 points  Repeat phrase 2 points  Total Score 6    Immunizations Immunization History  Administered Date(s) Administered  . Influenza,inj,quad, With Preservative 06/14/2018  . Influenza-Unspecified 06/05/2020  . Moderna Sars-Covid-2 Vaccination 10/26/2019, 11/24/2019, 07/24/2020  . Pneumococcal Conjugate-13 06/28/2017  . Pneumococcal Polysaccharide-23 09/30/2020  . Tdap 01/25/2021    TDAP status: Up to date  Flu Vaccine status: Up to date  Pneumococcal vaccine status: Up to date  Covid-19 vaccine status: Completed vaccines  Qualifies for Shingles Vaccine? Yes   Zostavax completed Yes   Shingrix Completed?: No.    Education has been provided regarding the importance of this vaccine. Patient has been  advised to call insurance company to determine out of pocket expense if they have not yet received this vaccine. Advised may also receive vaccine at local pharmacy or Health Dept. Verbalized acceptance and understanding.  Screening Tests Health Maintenance  Topic Date Due  . FOOT EXAM  Never done  . OPHTHALMOLOGY EXAM  Never done  . URINE MICROALBUMIN  Never done  . Zoster Vaccines- Shingrix (1 of 2) Never done  . DEXA SCAN  Never done  . COVID-19 Vaccine (4 - Booster for Moderna series) 11/21/2020  . INFLUENZA VACCINE  03/31/2021  . HEMOGLOBIN A1C  06/03/2021  . TETANUS/TDAP  01/26/2031  . PNA vac Low Risk Adult  Completed  . HPV VACCINES  Aged Out    Health Maintenance  Health Maintenance Due  Topic Date Due  . FOOT EXAM  Never done  . OPHTHALMOLOGY EXAM  Never done  . URINE MICROALBUMIN  Never done  . Zoster Vaccines- Shingrix (1 of 2) Never done  . DEXA SCAN  Never done  . COVID-19 Vaccine (4 - Booster for Moderna series) 11/21/2020    Colorectal cancer screening: Type of screening: Cologuard. Completed  . Repeat every 5 years  Mammogram; unable to perform quadraplegia   Bone Density status: Ordered  . Pt provided with contact info and advised to call to schedule appt.  Lung Cancer Screening: (Low Dose CT Chest recommended if Age 61-80 years, 30 pack-year currently smoking OR have quit w/in 15years.) does not qualify.   Lung Cancer Screening Referral: na  Additional Screening:  Hepatitis C Screening: does qualify; Completed   Vision Screening: Recommended annual ophthalmology exams for early detection of glaucoma and other disorders of the eye. Is  the patient up to date with their annual eye exam?  Yes  Who is the provider or what is the name of the office in which the patient attends annual eye exams?  If pt is not established with a provider, would they like to be referred to a provider to establish care? No .   Dental Screening: Recommended annual dental exams  for proper oral hygiene  Community Resource Referral / Chronic Care Management: CRR required this visit?  No   CCM required this visit?  No      Plan:     I have personally reviewed and noted the following in the patient's chart:   Medical and social history Use of alcohol, tobacco or illicit drugs  Current medications and supplements including opioid prescriptions.  Functional ability and status Nutritional status Physical activity Advanced directives List of other physicians Hospitalizations, surgeries, and ER visits in previous 12 months Vitals Screenings to include cognitive, depression, and falls Referrals and appointments  In addition, I have reviewed and discussed with patient certain preventive protocols, quality metrics, and best practice recommendations. A written personalized care plan for preventive services as well as general preventive health recommendations were provided to patient.     Gerlene Fee, NP   03/18/2021   Nurse Notes:

## 2021-03-20 ENCOUNTER — Other Ambulatory Visit (HOSPITAL_COMMUNITY)
Admission: RE | Admit: 2021-03-20 | Discharge: 2021-03-20 | Disposition: A | Discharge status: Home / Self Care | Payer: Medicare HMO | Source: Skilled Nursing Facility | Attending: Adult Health | Admitting: Adult Health

## 2021-03-20 DIAGNOSIS — E1122 Type 2 diabetes mellitus with diabetic chronic kidney disease: Secondary | ICD-10-CM | POA: Insufficient documentation

## 2021-03-20 LAB — HEPATITIS C ANTIBODY: HCV Ab: NONREACTIVE

## 2021-03-20 LAB — D-DIMER, QUANTITATIVE: D-Dimer, Quant: 0.86 ug/mL-FEU — ABNORMAL HIGH (ref 0.00–0.50)

## 2021-03-24 ENCOUNTER — Non-Acute Institutional Stay (SKILLED_NURSING_FACILITY): Payer: Medicare HMO | Admitting: Adult Health

## 2021-03-24 ENCOUNTER — Encounter: Payer: Self-pay | Admitting: Adult Health

## 2021-03-24 DIAGNOSIS — G8252 Quadriplegia, C1-C4 incomplete: Secondary | ICD-10-CM | POA: Diagnosis not present

## 2021-03-24 NOTE — Progress Notes (Signed)
Location:  [place] Room Number: 119-P Place of Service:  SNF (31)   CODE STATUS: DNR  Allergies  Allergen Reactions  . Codeine     Chief Complaint  Patient presents with  . Acute Visit    Leg Pain.    HPI:  She is complaining of bilateral leg pain. She states that her feet feel cold and numb. She denies any numbness or tingling. She states that her legs "twitch" at night. She would like something to take at night she does not want something nightly just on an as needed basis.   Past Medical History:  Diagnosis Date  . Diabetes mellitus without complication ([place])   . High cholesterol   . Neck pain   . Neuropathy     Past Surgical History:  Procedure Laterality Date  . ABDOMINAL HYSTERECTOMY    . ANTERIOR CERVICAL DECOMP/DISCECTOMY FUSION N/A 02/05/2021   Procedure: Cervical Three-Four  Anterior cervical decompression/discectomy/fusion;  Surgeon: Ashok Pall, MD;  Location: [place];  Service: Neurosurgery;  Laterality: N/A;  RM 20  . APPENDECTOMY    . BACK SURGERY    . CERVICAL DISC SURGERY    . HAND SURGERY    . KNEE SURGERY      Social History   Socioeconomic History  . Marital status: Widowed    Spouse name: Not on file  . Number of children: Not on file  . Years of education: Not on file  . Highest education level: Not on file  Occupational History  . Not on file  Tobacco Use  . Smoking status: Never  . Smokeless tobacco: Never  Vaping Use  . Vaping Use: Never used  Substance and Sexual Activity  . Alcohol use: Never  . Drug use: Never  . Sexual activity: Never  Other Topics Concern  . Not on file  Social History Narrative  . Not on file   Social Determinants of Health   Financial Resource Strain: Not on file  Food Insecurity: Not on file  Transportation Needs: Not on file  Physical Activity: Not on file  Stress: Not on file  Social Connections: Not on file  Intimate Partner Violence: Not on file   Family History   Problem Relation Age of Onset  . Cardiomyopathy Father   . Cancer Mother       VITAL SIGNS BP 122/65   Pulse 64   Temp (!) 97.1 F (36.2 C)   Resp 20   Ht '5\' 2"'$  (1.575 m)   Wt 153 lb 12.8 oz (69.8 kg)   SpO2 91%   BMI 28.13 kg/m   Outpatient Encounter Medications as of 03/24/2021  Medication Sig  . acetaminophen (TYLENOL) 325 MG tablet Take 2 tablets (650 mg total) by mouth every 6 (six) hours.  Marland Kitchen albuterol (VENTOLIN HFA) 108 (90 Base) MCG/ACT inhaler Inhale 1 puff into the lungs every 4 (four) hours as needed for wheezing or shortness of breath.  . Amino Acids-Protein Hydrolys (FEEDING SUPPLEMENT, PRO-STAT SUGAR FREE 64,) LIQD Take 30 mLs by mouth 3 (three) times daily with meals.  Marland Kitchen apixaban (ELIQUIS) 2.5 MG TABS tablet Take 2.5 mg by mouth 2 (two) times daily. for elevated D-dimer  . Artificial Saliva (BIOTENE MOISTURIZING MOUTH MT) Use as directed in the mouth or throat. 1 spray; mucous membrane For Dry Mouth, Three times a day  . aspirin 325 MG tablet Take 325 mg by mouth daily.  Marland Kitchen atorvastatin (LIPITOR) 20 MG tablet Take 1 tablet (20  mg total) by mouth every evening.  . chlorthalidone (HYGROTON) 25 MG tablet Take 25 mg by mouth daily.  . collagenase (SANTYL) ointment Apply 1 application topically. Apply to coccyx wound per tx orders  . gabapentin (NEURONTIN) 300 MG capsule Take 1 capsule (300 mg total) by mouth 2 (two) times daily.  . insulin glargine (LANTUS) 100 UNIT/ML injection Inject 20 Units into the skin daily.  . insulin lispro (HUMALOG) 100 UNIT/ML KwikPen Inject 5 Units into the skin 3 (three) times daily with meals.  . melatonin 3 MG TABS tablet Take 3 mg by mouth at bedtime.  . mirtazapine (REMERON) 15 MG tablet Take 15 mg by mouth at bedtime.  . NON FORMULARY Diet: NAS, Cons CHO  . omeprazole (PRILOSEC) 40 MG capsule Take 40 mg by mouth daily.  Marland Kitchen rOPINIRole (REQUIP) 0.25 MG tablet Take 0.25 mg by mouth at bedtime. for spastic legs  . tiZANidine (ZANAFLEX) 4  MG tablet Take 1 tablet (4 mg total) by mouth every 6 (six) hours as needed for muscle spasms.  . [DISCONTINUED] Ergocalciferol (VITAMIN D2 PO) Take by mouth. 1,250 mcg (50,000 unit); oral Special Instructions: Give once a week x 2 doses  . [DISCONTINUED] Multiple Vitamins-Minerals (ZINC) LOZG Take by mouth. Lozenges (vit a-vit c-zinc-propolis) lozenge; oral Special Instructions: Give 5x/day x 2 weeks  . [DISCONTINUED] vitamin C (ASCORBIC ACID) 500 MG tablet Take 500 mg by mouth 2 (two) times daily.   No facility-administered encounter medications on file as of 03/24/2021.     SIGNIFICANT DIAGNOSTIC EXAMS   PREVIOUS   12-02-20; ct of head: No acute intracranial abnormality. Polypoid sinus disease.  12-02-20: ct of cervical spine:  Postoperative and degenerative changes in the cervical spine. No acute bony abnormality.  12-02-20; ct of pelvis:  1. No evidence of acute fracture or dislocation of the pelvis or hips. 2. 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 3. Degenerative changes in the hips with subcortical cysts on the left hip.  12-02-20: ct of left knee:  1. Subtle acute nondisplaced fracture through the medial aspect of the head of the fibula. 2. 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 3. There is a small joint effusion.  12-02-20: mri left hip:  1. No acute findings involving the left hip. 2. Mild chronic bilateral hip avascular necrosis. Moderate degenerative hip findings bilaterally. No hip effusion or regional bursitis. 3. Mild left greater than right hamstring tendinopathy. 4. 1.3 cm left eccentric Bartholin's cyst or Gartner cyst.  12-02-20: mri lumbar spine:  1. 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 2. L3-4: Bilateral posterolateral disc bulges, more prominent on the left. Mild left foraminal encroachment that could possibly affect the left L3 nerve. Moderate to severe bilateral facet  osteoarthritis. These findings could relate to back pain or referred facet syndrome pain. 3. L4-5: Bilateral facet arthropathy with 2 mm of anterolisthesis. Bulging of the disc more prominent towards the right. Mild narrowing of the lateral recesses and of the intervertebral foramen on the right, but without visible neural compression. Findings at this level could relate to back pain or referred facet syndrome pain. 4. L5-S1: Previous left hemilaminectomy. Endplate osteophytes and bulging of the disc more prominent towards the left. Facet degeneration and hypertrophy left more than right with left foraminal stenosis and subarticular lateral recess stenosis could cause left-sided neural compression.   01-25-21: ct of head; maxillofacial cervical spine:  1. No acute intracranial pathology. 2. No acute/traumatic cervical spine pathology. 3. No acute  facial bone fractures.  01-26-21: ct of chest abdomen and pelvis:  1. No noncontrast CT evidence of acute traumatic injury to the chest, abdomen, or pelvis. 2. Distended urinary bladder. Mild bilateral hydronephrosis and hydroureter to the bilateral ureterovesicular junctions. No obstructing calculus or other etiology identified. Correlate for urinary retention. 3. No fracture or dislocation of the lumbar spine. Moderate disc space height loss and osteophytosis at L5-S1. Probable small broad-based posterior disc bulges at L4-L5 and L5-S1. 4. Coronary artery disease. Aortic Atherosclerosis  01-26-21: ct of right shoulder:  1.  No acute osseous injury of the right shoulder. 2. Mild mineralization in the infraspinatus tendon as can be seen with calcific tendinosis.  01-29-21: MRI of spine:  Postoperative and multilevel degenerative changes of the cervical spine. There is severe canal stenosis with cord compression at C3-C4. Abnormal cord signal is present just below this level (at operative levels) and may reflect edema or myelomalacia No significant  degenerative changes of the thoracic spine. Multilevel degenerative changes of lumbar spine similar to recent prior study.  01-30-21: renal ultrasound:  1. No acute abnormality. Small amount of right perinephric fluid, nonspecific.  02-05-21: chest x-ray:  1. Right internal jugular central venous catheter with tip over the mid SVC. No pneumothorax. 2. Hazy lung base opacities likely represent combination of atelectasis and pleural fluid. Vascular congestion.   03-06-21 chest x-ray: left lower lobe infiltrate likely representing pneumonia   NO NEW EXAMS   LABS REVIEWED PREVIOUS   12-02-20: wbc 6.4; hgb 11.3; hct 34.4; mcv 93.0 plt 216; glucose 209; bun 33; creat 1.13; k+ 4.3; na++ 137; ca 9.0; GFR 49; hgb a1c 8.6 12-03-20: wbc 6.5; hgb 9.8; hct 30.5 mcv 93.8 plt 182; glucose 188;bun 41; creat 1.49; k+ 4.4; na++ 134; ca 8.2; GFR 35 12-04-20; glucose 285; bun 29; creat 1.00; k+ 4.3; na++ 138; ca 8.7 GFR 56  01-25-21: wbc 6.4; hgb 10.6; hct 31.7; mcv 91.1 plt 227; glucose 152; bun 40; creat 1.2 k+ 4.2; na++ 135; ca 8.7; GFR 42; liver normal albumin 3.5 01-29-21: urine and blood culture: klebsiella pneumoniae 01-31-21: wbc 13.1; hgb 8.3; hct 23.6; mcv 88.1 plt 153; glucose 162; bun 67; creat 2.77; k+ 4.8; na++ 125; ca 7.5; GFR 17; ast 62; alt 57; albumin 1.7 02-04-21: wbc 9.8; hgb 7.6; hct 22.0; mcv 85.3 plt 212; glucose 161 bun 40; creat 1.40; k+ 3.4; na++ 127; ca 7.5; GFR 38; ast 62; alt 57; albumin 1.7  02-08-21: wbc 10.4; hgb 9.2; hct 27.6; mcv 87.3 plt 433; glucose 186; bun 23; creat 1.00 ;k+ 4.4; na++ 131; ca 8.1; GFR 56; ast 60; alt 61; albumin 2.1  02-20-21: hgb 9.4; hct 29.9 glucose 154; bun 51; creat 1.17; k+ 4.5; na++ 136; ca 8.9; GFR 47 02-24-21: glucose 175; bun 62; creat 1.13; k+ 4.5; na++ 133; ca 9.0 GFR 49 03-06-21: wbc 5.3; hgb 8.8; hct 27.1; mcv 93.4 plt 262; glucose 453; bun 66; creat 1.30; k+ 4.6; na++ 135; ca 9.0; GFR 41; d-dimer: 2.16; CRP 3.0   NO NEW LABS.   Review of Systems   Constitutional:  Negative for malaise/fatigue.  Respiratory:  Negative for cough and shortness of breath.   Cardiovascular:  Negative for chest pain, palpitations and leg swelling.  Gastrointestinal:  Negative for abdominal pain, constipation and heartburn.  Musculoskeletal:  Positive for myalgias. Negative for back pain and joint pain.       Bilateral leg pain   Skin: Negative.   Neurological:  Negative for dizziness.  Psychiatric/Behavioral:  The patient is not nervous/anxious.    Physical Exam Constitutional:      General: She is not in acute distress.    Appearance: She is well-developed. She is not diaphoretic.  Neck:     Thyroid: No thyromegaly.  Cardiovascular:     Rate and Rhythm: Normal rate and regular rhythm.     Pulses: Normal pulses.     Heart sounds: Normal heart sounds.  Pulmonary:     Effort: Pulmonary effort is normal. No respiratory distress.     Breath sounds: Normal breath sounds.  Abdominal:     General: Bowel sounds are normal. There is no distension.     Palpations: Abdomen is soft.     Tenderness: There is no abdominal tenderness.  Musculoskeletal:     Cervical back: Neck supple.     Right lower leg: No edema.     Left lower leg: No edema.     Comments: Is able to move upper extremities Has limited movement in lower extremities.   Lymphadenopathy:     Cervical: No cervical adenopathy.  Skin:    General: Skin is warm and dry.  Neurological:     Mental Status: She is alert and oriented to person, place, and time.  Psychiatric:        Mood and Affect: Mood normal.     ASSESSMENT/ PLAN:  TODAY  Quadriplegia C1-6; incomplete  Will begin requip 0.25 mg nightly as needed    [name] NP [facility]  Contact [phone] Monday through Friday 8am- 5pm  After hours call [phone]

## 2021-03-25 DIAGNOSIS — M4802 Spinal stenosis, cervical region: Secondary | ICD-10-CM | POA: Diagnosis not present

## 2021-03-25 DIAGNOSIS — R030 Elevated blood-pressure reading, without diagnosis of hypertension: Secondary | ICD-10-CM | POA: Diagnosis not present

## 2021-03-26 ENCOUNTER — Non-Acute Institutional Stay (SKILLED_NURSING_FACILITY): Payer: Medicare HMO | Admitting: Adult Health

## 2021-03-26 ENCOUNTER — Encounter: Payer: Self-pay | Admitting: Adult Health

## 2021-03-26 DIAGNOSIS — S14124S Central cord syndrome at C4 level of cervical spinal cord, sequela: Secondary | ICD-10-CM | POA: Diagnosis not present

## 2021-03-26 DIAGNOSIS — M5136 Other intervertebral disc degeneration, lumbar region: Secondary | ICD-10-CM | POA: Diagnosis not present

## 2021-03-26 DIAGNOSIS — M533 Sacrococcygeal disorders, not elsewhere classified: Secondary | ICD-10-CM | POA: Diagnosis not present

## 2021-03-26 DIAGNOSIS — E1122 Type 2 diabetes mellitus with diabetic chronic kidney disease: Secondary | ICD-10-CM | POA: Diagnosis not present

## 2021-03-26 DIAGNOSIS — N1831 Chronic kidney disease, stage 3a: Secondary | ICD-10-CM

## 2021-03-26 DIAGNOSIS — G8252 Quadriplegia, C1-C4 incomplete: Secondary | ICD-10-CM | POA: Diagnosis not present

## 2021-03-26 DIAGNOSIS — G9520 Unspecified cord compression: Secondary | ICD-10-CM | POA: Diagnosis not present

## 2021-03-26 DIAGNOSIS — Z794 Long term (current) use of insulin: Secondary | ICD-10-CM | POA: Diagnosis not present

## 2021-03-26 DIAGNOSIS — M545 Low back pain, unspecified: Secondary | ICD-10-CM | POA: Diagnosis not present

## 2021-03-26 NOTE — Progress Notes (Signed)
Location:  [facility] Room Number: 119-P Place of Service:  SNF (31)   CODE STATUS: DNR  Allergies  Allergen Reactions   Codeine     Chief Complaint  Patient presents with   Medical Management of Chronic Issues              Central cord syndrome at C4 level of cervical spine cord subsequent encounter: cord compression; quadriplegia C1-4 incomplete:     . Restless leg syndrome: . Closed fracture of proximal end of fibula unspecified fracture morphology sequela: Marland Kitchen Type 2 diabetes mellitus with diabetic neuropathy without long term current use of insulin    HPI:  She is a 85 year old long term resident of this facility being seen for the management of her chronic illnesses: Central cord syndrome at C4 level of cervical spine cord subsequent encounter: cord compression; quadriplegia C1-4 incomplete:     . Restless leg syndrome: . Closed fracture of proximal end of fibula unspecified fracture morphology sequela: Marland Kitchen Type 2 diabetes mellitus with diabetic neuropathy without long term current use of insulin. She is having left leg and hip pain. The pain starts in the hip area and radiates to her lower leg. She has twitching in her leg left and has started requip   Past Medical History:  Diagnosis Date   Diabetes mellitus without complication ([place])    High cholesterol    Neck pain    Neuropathy     Past Surgical History:  Procedure Laterality Date   ABDOMINAL HYSTERECTOMY     ANTERIOR CERVICAL DECOMP/DISCECTOMY FUSION N/A 02/05/2021   Procedure: Cervical Three-Four  Anterior cervical decompression/discectomy/fusion;  Surgeon: Ashok Pall, MD;  Location: [facility];  Service: Neurosurgery;  Laterality: N/A;  RM 20   APPENDECTOMY     BACK SURGERY     CERVICAL DISC SURGERY     HAND SURGERY     KNEE SURGERY      Social History   Socioeconomic History   Marital status: Widowed    Spouse name: Not on file   Number of children: Not on file   Years of education: Not  on file   Highest education level: Not on file  Occupational History   Not on file  Tobacco Use   Smoking status: Never   Smokeless tobacco: Never  Vaping Use   Vaping Use: Never used  Substance and Sexual Activity   Alcohol use: Never   Drug use: Never   Sexual activity: Never  Other Topics Concern   Not on file  Social History Narrative   Not on file   Social Determinants of Health   Financial Resource Strain: Not on file  Food Insecurity: Not on file  Transportation Needs: Not on file  Physical Activity: Not on file  Stress: Not on file  Social Connections: Not on file  Intimate Partner Violence: Not on file   Family History  Problem Relation Age of Onset   Cardiomyopathy Father    Cancer Mother       VITAL SIGNS BP 122/65   Pulse 64   Temp (!) 97.1 F (36.2 C)   Resp 20   Ht '5\' 2"'$  (1.575 m)   Wt 153 lb 12.8 oz (69.8 kg)   SpO2 91%   BMI 28.13 kg/m   Outpatient Encounter Medications as of 03/26/2021  Medication Sig   acetaminophen (TYLENOL) 325 MG tablet Take 2 tablets (650 mg total) by mouth every 6 (six) hours. (Patient taking differently:  Take 650 mg by mouth every 6 (six) hours. Max 3gm acetaminophen/24 hrs from all sources. Resident's request.)   albuterol (VENTOLIN HFA) 108 (90 Base) MCG/ACT inhaler Inhale 1 puff into the lungs every 4 (four) hours as needed for wheezing or shortness of breath.   Amino Acids-Protein Hydrolys (FEEDING SUPPLEMENT, PRO-STAT SUGAR FREE 64,) LIQD Take 30 mLs by mouth 3 (three) times daily with meals.   apixaban (ELIQUIS) 2.5 MG TABS tablet Take 2.5 mg by mouth 2 (two) times daily. for elevated D-dimer   Artificial Saliva (BIOTENE MOISTURIZING MOUTH MT) Use as directed in the mouth or throat. 1 spray; mucous membrane For Dry Mouth, Three times a day   aspirin 325 MG tablet Take 325 mg by mouth daily.   atorvastatin (LIPITOR) 20 MG tablet Take 1 tablet (20 mg total) by mouth every evening.   chlorthalidone (HYGROTON) 25 MG  tablet Take 25 mg by mouth daily.   collagenase (SANTYL) ointment Apply 1 application topically. Apply to coccyx wound per tx orders   gabapentin (NEURONTIN) 300 MG capsule Take 1 capsule (300 mg total) by mouth 2 (two) times daily.   insulin glargine (LANTUS) 100 UNIT/ML injection Inject 20 Units into the skin at bedtime.   insulin lispro (HUMALOG) 100 UNIT/ML KwikPen Inject 5 Units into the skin 3 (three) times daily with meals.   melatonin 3 MG TABS tablet Take 3 mg by mouth at bedtime.   mirtazapine (REMERON) 15 MG tablet Take 15 mg by mouth at bedtime.   NON FORMULARY Diet: NAS, Cons CHO   omeprazole (PRILOSEC) 40 MG capsule Take 40 mg by mouth daily.   rOPINIRole (REQUIP) 0.25 MG tablet Take 0.25 mg by mouth at bedtime. for spastic legs   tiZANidine (ZANAFLEX) 4 MG tablet Take 1 tablet (4 mg total) by mouth every 6 (six) hours as needed for muscle spasms.   No facility-administered encounter medications on file as of 03/26/2021.     SIGNIFICANT DIAGNOSTIC EXAMS   PREVIOUS   12-02-20; ct of head: No acute intracranial abnormality. Polypoid sinus disease.  12-02-20: ct of cervical spine:  Postoperative and degenerative changes in the cervical spine. No acute bony abnormality.  12-02-20; ct of pelvis:  1. No evidence of acute fracture or dislocation of the pelvis or hips. 2. 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 3. Degenerative changes in the hips with subcortical cysts on the left hip.  12-02-20: ct of left knee:  1. Subtle acute nondisplaced fracture through the medial aspect of the head of the fibula. 2. 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 3. There is a small joint effusion.  12-02-20: mri left hip:  1. No acute findings involving the left hip. 2. Mild chronic bilateral hip avascular necrosis. Moderate degenerative hip findings bilaterally. No hip effusion or regional bursitis. 3. Mild left greater  than right hamstring tendinopathy. 4. 1.3 cm left eccentric Bartholin's cyst or Gartner cyst.  12-02-20: mri lumbar spine:  1. 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 2. L3-4: Bilateral posterolateral disc bulges, more prominent on the left. Mild left foraminal encroachment that could possibly affect the left L3 nerve. Moderate to severe bilateral facet osteoarthritis. These findings could relate to back pain or referred facet syndrome pain. 3. L4-5: Bilateral facet arthropathy with 2 mm of anterolisthesis. Bulging of the disc more prominent towards the right. Mild narrowing of the lateral recesses and of the intervertebral foramen on the right, but without visible neural compression. Findings at this  level could relate to back pain or referred facet syndrome pain. 4. L5-S1: Previous left hemilaminectomy. Endplate osteophytes and bulging of the disc more prominent towards the left. Facet degeneration and hypertrophy left more than right with left foraminal stenosis and subarticular lateral recess stenosis could cause left-sided neural compression.   01-25-21: ct of head; maxillofacial cervical spine:  1. No acute intracranial pathology. 2. No acute/traumatic cervical spine pathology. 3. No acute facial bone fractures.  01-26-21: ct of chest abdomen and pelvis:  1. No noncontrast CT evidence of acute traumatic injury to the chest, abdomen, or pelvis. 2. Distended urinary bladder. Mild bilateral hydronephrosis and hydroureter to the bilateral ureterovesicular junctions. No obstructing calculus or other etiology identified. Correlate for urinary retention. 3. No fracture or dislocation of the lumbar spine. Moderate disc space height loss and osteophytosis at L5-S1. Probable small broad-based posterior disc bulges at L4-L5 and L5-S1. 4. Coronary artery disease. Aortic Atherosclerosis  01-26-21: ct of right shoulder:  1.  No acute osseous injury of the right shoulder. 2. Mild  mineralization in the infraspinatus tendon as can be seen with calcific tendinosis.  01-29-21: MRI of spine:  Postoperative and multilevel degenerative changes of the cervical spine. There is severe canal stenosis with cord compression at C3-C4. Abnormal cord signal is present just below this level (at operative levels) and may reflect edema or myelomalacia No significant degenerative changes of the thoracic spine. Multilevel degenerative changes of lumbar spine similar to recent prior study.  01-30-21: renal ultrasound:  1. No acute abnormality. Small amount of right perinephric fluid, nonspecific.  02-05-21: chest x-ray:  1. Right internal jugular central venous catheter with tip over the mid SVC. No pneumothorax. 2. Hazy lung base opacities likely represent combination of atelectasis and pleural fluid. Vascular congestion.   03-06-21 chest x-ray: left lower lobe infiltrate likely representing pneumonia   NO NEW EXAMS   LABS REVIEWED PREVIOUS   12-02-20: wbc 6.4; hgb 11.3; hct 34.4; mcv 93.0 plt 216; glucose 209; bun 33; creat 1.13; k+ 4.3; na++ 137; ca 9.0; GFR 49; hgb a1c 8.6 12-03-20: wbc 6.5; hgb 9.8; hct 30.5 mcv 93.8 plt 182; glucose 188;bun 41; creat 1.49; k+ 4.4; na++ 134; ca 8.2; GFR 35 12-04-20; glucose 285; bun 29; creat 1.00; k+ 4.3; na++ 138; ca 8.7 GFR 56  01-25-21: wbc 6.4; hgb 10.6; hct 31.7; mcv 91.1 plt 227; glucose 152; bun 40; creat 1.2 k+ 4.2; na++ 135; ca 8.7; GFR 42; liver normal albumin 3.5 01-29-21: urine and blood culture: klebsiella pneumoniae 01-31-21: wbc 13.1; hgb 8.3; hct 23.6; mcv 88.1 plt 153; glucose 162; bun 67; creat 2.77; k+ 4.8; na++ 125; ca 7.5; GFR 17; ast 62; alt 57; albumin 1.7 02-04-21: wbc 9.8; hgb 7.6; hct 22.0; mcv 85.3 plt 212; glucose 161 bun 40; creat 1.40; k+ 3.4; na++ 127; ca 7.5; GFR 38; ast 62; alt 57; albumin 1.7  02-08-21: wbc 10.4; hgb 9.2; hct 27.6; mcv 87.3 plt 433; glucose 186; bun 23; creat 1.00 ;k+ 4.4; na++ 131; ca 8.1; GFR 56; ast 60; alt 61;  albumin 2.1  02-20-21: hgb 9.4; hct 29.9 glucose 154; bun 51; creat 1.17; k+ 4.5; na++ 136; ca 8.9; GFR 47 02-24-21: glucose 175; bun 62; creat 1.13; k+ 4.5; na++ 133; ca 9.0 GFR 49 03-06-21: wbc 5.3; hgb 8.8; hct 27.1; mcv 93.4 plt 262; glucose 453; bun 66; creat 1.30; k+ 4.6; na++ 135; ca 9.0; GFR 41; d-dimer: 2.16; CRP 3.0   NO NEW LABS.   Review  of Systems  Constitutional:  Negative for malaise/fatigue.  Respiratory:  Negative for cough and shortness of breath.   Cardiovascular:  Negative for chest pain, palpitations and leg swelling.  Gastrointestinal:  Negative for abdominal pain, constipation and heartburn.  Musculoskeletal:  Positive for myalgias. Negative for back pain and joint pain.       Left leg pain   Skin: Negative.   Neurological:  Negative for dizziness.  Psychiatric/Behavioral:  The patient is not nervous/anxious.    Physical Exam Constitutional:      General: She is not in acute distress.    Appearance: She is well-developed. She is not diaphoretic.  Neck:     Thyroid: No thyromegaly.  Cardiovascular:     Rate and Rhythm: Normal rate and regular rhythm.     Heart sounds: Normal heart sounds.  Pulmonary:     Effort: Pulmonary effort is normal. No respiratory distress.     Breath sounds: Normal breath sounds.  Abdominal:     General: Bowel sounds are normal. There is no distension.     Palpations: Abdomen is soft.     Tenderness: There is no abdominal tenderness.  Musculoskeletal:     Cervical back: Neck supple.     Right lower leg: No edema.     Left lower leg: No edema.     Comments:  Is able to move upper extremities Has limited movement in lower extremities.    Lymphadenopathy:     Cervical: No cervical adenopathy.  Skin:    General: Skin is warm and dry.  Neurological:     Mental Status: She is alert and oriented to person, place, and time.  Psychiatric:        Mood and Affect: Mood normal.     ASSESSMENT/ PLAN:      TODAY  Central cord  syndrome at C4 level of cervical spine cord subsequent encounter: cord compression; quadriplegia C1-4 incomplete: is without change in status: will continue therapy as ordered; asa 325 mg daily; zanaflex 4 mg every 6 hours for one week will monitor her status.   2. Restless leg syndrome: is stable will continue requip 0.25 mg nightly   3. Closed fracture of proximal end of fibula unspecified fracture morphology sequela: is stable is presently not on narcotic pain relief.   4. Type 2 diabetes mellitus with diabetic neuropathy without long term current use of insulin: hgb a1c 8.6. is off metformin. Her insulin was stopped as well. Her cbg's become elevated during the day. Will begin humalog 5  units with lunch  Will continue  lantus 10 units nightly and will monitor her status.   PREVIOUS   5. Hyperlipidemia associated with type 2 diabetes mellitus: is stable will continue lipitor 20 mg daily   6. Hypertension associated with diabetes: is stable b/p 122/65 her lisinopril was stopped in the hospital due to renal failure; will monitor her status. Will continue hygroton 25 mg daily   7. GERD without esophagitis: is stable will continue prilosec 40 mg daily   8. Chronic constipation: is presently not on medications will monitor   9. Lumbar radicular syndrome/vascular necrosis bilateral hips: is without change will continue tylenol 650 mg every 6 hours and gabapentin 300 mg twice daily; is off prednisone and robaxin   10. Diabetic peripheral neuropathy: is stable will continue gabapentin 300 mg twice daily   11. CKD stage 3b due to type 2 diabetes; bun 23; creat 1.00; GFR 38 will monitor   12. Normocytic anemia:  is stable hgb 9.2 will monitor   13. Septic shock due to klebsiella pneumoniae: has completed ABT will monitor her status.   14. Urine retention: is stable has foley will monitor   15. Chronic anxiety: is stable will continue remeron 15 mg nightly and has xanax 0.25 mg three times  daily as needed through 02-20-21.   16. Protein calorie malnutrition severe: is without change: albumin 2.1: will continue  prostat 30 mL with meals.   17. Aortic atherosclerosis: ( ct 01-26-21) will monitor   Will check urine for microalbumin; lipids; hgb a1c dexa scan Lumbar/sacral spine x-ray.   [name] NP [facility]  Contact [phone] Monday through Friday 8am- 5pm  After hours call [phone]

## 2021-03-27 ENCOUNTER — Non-Acute Institutional Stay (SKILLED_NURSING_FACILITY): Payer: Medicare HMO | Admitting: Adult Health

## 2021-03-27 ENCOUNTER — Other Ambulatory Visit (HOSPITAL_COMMUNITY)
Admission: RE | Admit: 2021-03-27 | Discharge: 2021-03-27 | Disposition: A | Discharge status: Home / Self Care | Payer: Medicare HMO | Source: Skilled Nursing Facility | Attending: Adult Health | Admitting: Adult Health

## 2021-03-27 ENCOUNTER — Encounter: Payer: Self-pay | Admitting: Adult Health

## 2021-03-27 DIAGNOSIS — E43 Unspecified severe protein-calorie malnutrition: Secondary | ICD-10-CM | POA: Diagnosis not present

## 2021-03-27 DIAGNOSIS — I152 Hypertension secondary to endocrine disorders: Secondary | ICD-10-CM

## 2021-03-27 DIAGNOSIS — I700 Atherosclerosis of aorta: Secondary | ICD-10-CM | POA: Diagnosis not present

## 2021-03-27 DIAGNOSIS — G8252 Quadriplegia, C1-C4 incomplete: Secondary | ICD-10-CM | POA: Diagnosis not present

## 2021-03-27 DIAGNOSIS — E1159 Type 2 diabetes mellitus with other circulatory complications: Secondary | ICD-10-CM | POA: Diagnosis not present

## 2021-03-27 DIAGNOSIS — M85842 Other specified disorders of bone density and structure, left hand: Secondary | ICD-10-CM | POA: Diagnosis not present

## 2021-03-27 DIAGNOSIS — Z8616 Personal history of COVID-19: Secondary | ICD-10-CM | POA: Insufficient documentation

## 2021-03-27 LAB — D-DIMER, QUANTITATIVE: D-Dimer, Quant: 0.52 ug/mL-FEU — ABNORMAL HIGH (ref 0.00–0.50)

## 2021-03-27 NOTE — Progress Notes (Signed)
Location:  [place] Room Number: 119-P Place of Service:  SNF (31)   CODE STATUS: DNR  Allergies  Allergen Reactions   Codeine     Chief Complaint  Patient presents with   Acute Visit    Care plan meeting.    HPI:  We have come together for her care plan meeting. Family present.  BIMS 13/15; mood: 9/30: not sleeping well; decreased appetite and energy. There have been no falls. She is dependent for her adls. She requires some assist with her meals. Has foley incontinent of bowel. She has started on tiznidine routinely for 7 days. Has been started on reqiup. Therapy rolling in bed with contact guard supine to sit max assistance; sit on edge of bed with mod assist for 20 minutes. Out of bed for one hour; hoyer lift for tranfers; upper body: mod to max assist; lower body: max assist; dependent for toileting. Cbg readings are stable.   Dietary: weight is 154 pounds  diet NAS CON CHO appetite is fair uses built up utensils; will remove salt and sugar restrictions to regular diet . She continues to be followed for her chronic illnesses including: Aortic atherosclerosis  Quadriplegia C1-C4 incomplete  Protein calorie malnutrition severe Hypertension associated with diabetes.  Past Medical History:  Diagnosis Date   Diabetes mellitus without complication ([place])    High cholesterol    Neck pain    Neuropathy     Past Surgical History:  Procedure Laterality Date   ABDOMINAL HYSTERECTOMY     ANTERIOR CERVICAL DECOMP/DISCECTOMY FUSION N/A 02/05/2021   Procedure: Cervical Three-Four  Anterior cervical decompression/discectomy/fusion;  Surgeon: Ashok Pall, MD;  Location: [place];  Service: Neurosurgery;  Laterality: N/A;  RM 20   APPENDECTOMY     BACK SURGERY     CERVICAL DISC SURGERY     HAND SURGERY     KNEE SURGERY      Social History   Socioeconomic History   Marital status: Widowed    Spouse name: Not on file   Number of children: Not on file   Years of  education: Not on file   Highest education level: Not on file  Occupational History   Not on file  Tobacco Use   Smoking status: Never   Smokeless tobacco: Never  Vaping Use   Vaping Use: Never used  Substance and Sexual Activity   Alcohol use: Never   Drug use: Never   Sexual activity: Never  Other Topics Concern   Not on file  Social History Narrative   Not on file   Social Determinants of Health   Financial Resource Strain: Not on file  Food Insecurity: Not on file  Transportation Needs: Not on file  Physical Activity: Not on file  Stress: Not on file  Social Connections: Not on file  Intimate Partner Violence: Not on file   Family History  Problem Relation Age of Onset   Cardiomyopathy Father    Cancer Mother       VITAL SIGNS BP 122/65   Pulse 64   Temp (!) 97.1 F (36.2 C)   Resp 20   Ht '5\' 2"'$  (1.575 m)   Wt 153 lb 12.8 oz (69.8 kg)   SpO2 91%   BMI 28.13 kg/m   Outpatient Encounter Medications as of 03/27/2021  Medication Sig   acetaminophen (TYLENOL) 325 MG tablet Take 2 tablets (650 mg total) by mouth every 6 (six) hours. (Patient taking differently: Take 650 mg by  mouth every 6 (six) hours. Max 3gm acetaminophen/24 hrs from all sources. Resident's request.)   albuterol (VENTOLIN HFA) 108 (90 Base) MCG/ACT inhaler Inhale 1 puff into the lungs every 4 (four) hours as needed for wheezing or shortness of breath.   Amino Acids-Protein Hydrolys (FEEDING SUPPLEMENT, PRO-STAT SUGAR FREE 64,) LIQD Take 30 mLs by mouth 3 (three) times daily with meals.   apixaban (ELIQUIS) 2.5 MG TABS tablet Take 2.5 mg by mouth 2 (two) times daily. for elevated D-dimer   Artificial Saliva (BIOTENE MOISTURIZING MOUTH MT) Use as directed in the mouth or throat. 1 spray; mucous membrane For Dry Mouth, Three times a day   aspirin 325 MG tablet Take 325 mg by mouth daily.   atorvastatin (LIPITOR) 20 MG tablet Take 1 tablet (20 mg total) by mouth every evening.   chlorthalidone  (HYGROTON) 25 MG tablet Take 25 mg by mouth daily.   collagenase (SANTYL) ointment Apply 1 application topically. Apply to coccyx wound per tx orders   gabapentin (NEURONTIN) 300 MG capsule Take 1 capsule (300 mg total) by mouth 2 (two) times daily.   insulin glargine (LANTUS) 100 UNIT/ML injection Inject 20 Units into the skin at bedtime.   insulin lispro (HUMALOG) 100 UNIT/ML KwikPen Inject 5 Units into the skin 3 (three) times daily with meals.   melatonin 3 MG TABS tablet Take 3 mg by mouth at bedtime.   mirtazapine (REMERON) 15 MG tablet Take 15 mg by mouth at bedtime.   NON FORMULARY Diet: NAS, Cons CHO   omeprazole (PRILOSEC) 40 MG capsule Take 40 mg by mouth daily.   rOPINIRole (REQUIP) 0.25 MG tablet Take 0.25 mg by mouth at bedtime. for spastic legs   tiZANidine (ZANAFLEX) 4 MG tablet Take 1 tablet (4 mg total) by mouth every 6 (six) hours as needed for muscle spasms.   No facility-administered encounter medications on file as of 03/27/2021.     SIGNIFICANT DIAGNOSTIC EXAMS  PREVIOUS   12-02-20; ct of head: No acute intracranial abnormality. Polypoid sinus disease.  12-02-20: ct of cervical spine:  Postoperative and degenerative changes in the cervical spine. No acute bony abnormality.  12-02-20; ct of pelvis:  1. No evidence of acute fracture or dislocation of the pelvis or hips. 2. 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 3. Degenerative changes in the hips with subcortical cysts on the left hip.  12-02-20: ct of left knee:  1. Subtle acute nondisplaced fracture through the medial aspect of the head of the fibula. 2. 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 3. There is a small joint effusion.  12-02-20: mri left hip:  1. No acute findings involving the left hip. 2. Mild chronic bilateral hip avascular necrosis. Moderate degenerative hip findings bilaterally. No hip effusion or regional bursitis. 3. Mild  left greater than right hamstring tendinopathy. 4. 1.3 cm left eccentric Bartholin's cyst or Gartner cyst.  12-02-20: mri lumbar spine:  1. 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 2. L3-4: Bilateral posterolateral disc bulges, more prominent on the left. Mild left foraminal encroachment that could possibly affect the left L3 nerve. Moderate to severe bilateral facet osteoarthritis. These findings could relate to back pain or referred facet syndrome pain. 3. L4-5: Bilateral facet arthropathy with 2 mm of anterolisthesis. Bulging of the disc more prominent towards the right. Mild narrowing of the lateral recesses and of the intervertebral foramen on the right, but without visible neural compression. Findings at this level could relate to back  pain or referred facet syndrome pain. 4. L5-S1: Previous left hemilaminectomy. Endplate osteophytes and bulging of the disc more prominent towards the left. Facet degeneration and hypertrophy left more than right with left foraminal stenosis and subarticular lateral recess stenosis could cause left-sided neural compression.   01-25-21: ct of head; maxillofacial cervical spine:  1. No acute intracranial pathology. 2. No acute/traumatic cervical spine pathology. 3. No acute facial bone fractures.  01-26-21: ct of chest abdomen and pelvis:  1. No noncontrast CT evidence of acute traumatic injury to the chest, abdomen, or pelvis. 2. Distended urinary bladder. Mild bilateral hydronephrosis and hydroureter to the bilateral ureterovesicular junctions. No obstructing calculus or other etiology identified. Correlate for urinary retention. 3. No fracture or dislocation of the lumbar spine. Moderate disc space height loss and osteophytosis at L5-S1. Probable small broad-based posterior disc bulges at L4-L5 and L5-S1. 4. Coronary artery disease. Aortic Atherosclerosis  01-26-21: ct of right shoulder:  1.  No acute osseous injury of the right shoulder. 2.  Mild mineralization in the infraspinatus tendon as can be seen with calcific tendinosis.  01-29-21: MRI of spine:  Postoperative and multilevel degenerative changes of the cervical spine. There is severe canal stenosis with cord compression at C3-C4. Abnormal cord signal is present just below this level (at operative levels) and may reflect edema or myelomalacia No significant degenerative changes of the thoracic spine. Multilevel degenerative changes of lumbar spine similar to recent prior study.  01-30-21: renal ultrasound:  1. No acute abnormality. Small amount of right perinephric fluid, nonspecific.  02-05-21: chest x-ray:  1. Right internal jugular central venous catheter with tip over the mid SVC. No pneumothorax. 2. Hazy lung base opacities likely represent combination of atelectasis and pleural fluid. Vascular congestion.   03-06-21 chest x-ray: left lower lobe infiltrate likely representing pneumonia   TODAY  03-26-21: lumbar x-ray: mild to moderate degenerative changes in lumbar spine  03-26-21: no acute bone abnormality    LABS REVIEWED PREVIOUS   12-02-20: wbc 6.4; hgb 11.3; hct 34.4; mcv 93.0 plt 216; glucose 209; bun 33; creat 1.13; k+ 4.3; na++ 137; ca 9.0; GFR 49; hgb a1c 8.6 12-03-20: wbc 6.5; hgb 9.8; hct 30.5 mcv 93.8 plt 182; glucose 188;bun 41; creat 1.49; k+ 4.4; na++ 134; ca 8.2; GFR 35 12-04-20; glucose 285; bun 29; creat 1.00; k+ 4.3; na++ 138; ca 8.7 GFR 56  01-25-21: wbc 6.4; hgb 10.6; hct 31.7; mcv 91.1 plt 227; glucose 152; bun 40; creat 1.2 k+ 4.2; na++ 135; ca 8.7; GFR 42; liver normal albumin 3.5 01-29-21: urine and blood culture: klebsiella pneumoniae 01-31-21: wbc 13.1; hgb 8.3; hct 23.6; mcv 88.1 plt 153; glucose 162; bun 67; creat 2.77; k+ 4.8; na++ 125; ca 7.5; GFR 17; ast 62; alt 57; albumin 1.7 02-04-21: wbc 9.8; hgb 7.6; hct 22.0; mcv 85.3 plt 212; glucose 161 bun 40; creat 1.40; k+ 3.4; na++ 127; ca 7.5; GFR 38; ast 62; alt 57; albumin 1.7  02-08-21: wbc 10.4; hgb  9.2; hct 27.6; mcv 87.3 plt 433; glucose 186; bun 23; creat 1.00 ;k+ 4.4; na++ 131; ca 8.1; GFR 56; ast 60; alt 61; albumin 2.1  02-20-21: hgb 9.4; hct 29.9 glucose 154; bun 51; creat 1.17; k+ 4.5; na++ 136; ca 8.9; GFR 47 02-24-21: glucose 175; bun 62; creat 1.13; k+ 4.5; na++ 133; ca 9.0 GFR 49 03-06-21: wbc 5.3; hgb 8.8; hct 27.1; mcv 93.4 plt 262; glucose 453; bun 66; creat 1.30; k+ 4.6; na++ 135; ca 9.0; GFR 41;  d-dimer: 2.16; CRP 3.0   NO NEW LABS.   Review of Systems  Constitutional:  Negative for malaise/fatigue.  Respiratory:  Negative for cough and shortness of breath.   Cardiovascular:  Negative for chest pain, palpitations and leg swelling.  Gastrointestinal:  Negative for abdominal pain, constipation and heartburn.  Musculoskeletal:  Positive for myalgias. Negative for back pain and joint pain.       Leg pain   Skin: Negative.   Neurological:  Negative for dizziness.  Psychiatric/Behavioral:  The patient is not nervous/anxious.    Physical Exam Constitutional:      General: She is not in acute distress.    Appearance: She is well-developed. She is not diaphoretic.  Neck:     Thyroid: No thyromegaly.  Cardiovascular:     Rate and Rhythm: Normal rate and regular rhythm.     Heart sounds: Normal heart sounds.  Pulmonary:     Effort: Pulmonary effort is normal. No respiratory distress.     Breath sounds: Normal breath sounds.  Abdominal:     General: Bowel sounds are normal. There is no distension.     Palpations: Abdomen is soft.     Tenderness: There is no abdominal tenderness.  Musculoskeletal:     Cervical back: Neck supple.     Right lower leg: No edema.     Left lower leg: No edema.     Comments:  Is able to move upper extremities Has limited movement in lower extremities.    Lymphadenopathy:     Cervical: No cervical adenopathy.  Skin:    General: Skin is warm and dry.     Comments: Sacral wound: 2.4 x 2.8 slough present   Neurological:     Mental Status: She  is alert and oriented to person, place, and time.  Psychiatric:        Mood and Affect: Mood normal.      ASSESSMENT/ PLAN:  TODAY  Aortic atherosclerosis Quadriplegia C1-C4 incomplete Protein calorie malnutrition severe Hypertension associated with diabetes.  Will continue current plan of care.  Will continue therapy as directed Will continue to monitor her status.    Time spent with patient: 40 minutes: therapy needs; goals of care; medications.    [name] NP [facility]  Contact [phone] Monday through Friday 8am- 5pm  After hours call [phone]

## 2021-03-31 ENCOUNTER — Encounter (HOSPITAL_COMMUNITY)
Admission: AD | Admit: 2021-03-31 | Discharge: 2021-03-31 | Disposition: A | Discharge status: Home / Self Care | Payer: Medicare HMO | Source: Skilled Nursing Facility | Attending: Adult Health | Admitting: Adult Health

## 2021-03-31 DIAGNOSIS — N189 Chronic kidney disease, unspecified: Secondary | ICD-10-CM | POA: Insufficient documentation

## 2021-03-31 DIAGNOSIS — E1122 Type 2 diabetes mellitus with diabetic chronic kidney disease: Secondary | ICD-10-CM | POA: Insufficient documentation

## 2021-03-31 LAB — CBC WITH DIFFERENTIAL/PLATELET
Abs Immature Granulocytes: 0.01 10*3/uL (ref 0.00–0.07)
Basophils Absolute: 0 10*3/uL (ref 0.0–0.1)
Basophils Relative: 0 %
Eosinophils Absolute: 0.2 10*3/uL (ref 0.0–0.5)
Eosinophils Relative: 4 %
HCT: 26.8 % — ABNORMAL LOW (ref 36.0–46.0)
Hemoglobin: 8.5 g/dL — ABNORMAL LOW (ref 12.0–15.0)
Immature Granulocytes: 0 %
Lymphocytes Relative: 55 %
Lymphs Abs: 3 10*3/uL (ref 0.7–4.0)
MCH: 29.6 pg (ref 26.0–34.0)
MCHC: 31.7 g/dL (ref 30.0–36.0)
MCV: 93.4 fL (ref 80.0–100.0)
Monocytes Absolute: 0.3 10*3/uL (ref 0.1–1.0)
Monocytes Relative: 6 %
Neutro Abs: 2 10*3/uL (ref 1.7–7.7)
Neutrophils Relative %: 35 %
Platelets: 203 10*3/uL (ref 150–400)
RBC: 2.87 MIL/uL — ABNORMAL LOW (ref 3.87–5.11)
RDW: 14.6 % (ref 11.5–15.5)
WBC: 5.5 10*3/uL (ref 4.0–10.5)
nRBC: 0 % (ref 0.0–0.2)

## 2021-03-31 LAB — LIPID PANEL
Cholesterol: 130 mg/dL (ref 0–200)
HDL: 34 mg/dL — ABNORMAL LOW (ref >40)
LDL Cholesterol: 68 mg/dL (ref 0–99)
Total CHOL/HDL Ratio: 3.8 RATIO
Triglycerides: 138 mg/dL (ref <150)
VLDL: 28 mg/dL (ref 0–40)

## 2021-03-31 LAB — HEMOGLOBIN A1C
Hgb A1c MFr Bld: 7.6 % — ABNORMAL HIGH (ref 4.8–5.6)
Mean Plasma Glucose: 171.42 mg/dL

## 2021-04-01 LAB — MICROALBUMIN, URINE: Microalb, Ur: 25.1 ug/mL — ABNORMAL HIGH

## 2021-04-10 DIAGNOSIS — Z1212 Encounter for screening for malignant neoplasm of rectum: Secondary | ICD-10-CM | POA: Diagnosis not present

## 2021-04-10 DIAGNOSIS — Z1211 Encounter for screening for malignant neoplasm of colon: Secondary | ICD-10-CM | POA: Diagnosis not present

## 2021-04-16 ENCOUNTER — Encounter: Payer: Self-pay | Admitting: Adult Health

## 2021-04-16 ENCOUNTER — Non-Acute Institutional Stay (SKILLED_NURSING_FACILITY): Payer: Medicare HMO | Admitting: Adult Health

## 2021-04-16 DIAGNOSIS — G8252 Quadriplegia, C1-C4 incomplete: Secondary | ICD-10-CM | POA: Diagnosis not present

## 2021-04-16 DIAGNOSIS — Z23 Encounter for immunization: Secondary | ICD-10-CM | POA: Diagnosis not present

## 2021-04-16 DIAGNOSIS — G2581 Restless legs syndrome: Secondary | ICD-10-CM

## 2021-04-16 DIAGNOSIS — E1142 Type 2 diabetes mellitus with diabetic polyneuropathy: Secondary | ICD-10-CM

## 2021-04-16 NOTE — Progress Notes (Signed)
Location:  [place] Room Number: 119 Place of Service:  SNF (31)   CODE STATUS: DNR  Allergies  Allergen Reactions   Codeine     Chief Complaint  Patient presents with   Acute Visit    Pain management    HPI:  She continues to have issues with her pain management. She is having bilateral lower extremity pain; states feel cold and tingly. She is having RLS as well. She is taking gabapentin 300 mg twice daily and requip 0.25 mg nightly. We have discussed her medications. She is wanting her medications adjusted to better manage her pain.   Past Medical History:  Diagnosis Date   Diabetes mellitus without complication ([place])    High cholesterol    Neck pain    Neuropathy     Past Surgical History:  Procedure Laterality Date   ABDOMINAL HYSTERECTOMY     ANTERIOR CERVICAL DECOMP/DISCECTOMY FUSION N/A 02/05/2021   Procedure: Cervical Three-Four  Anterior cervical decompression/discectomy/fusion;  Surgeon: Ashok Pall, MD;  Location: [place];  Service: Neurosurgery;  Laterality: N/A;  RM 20   APPENDECTOMY     BACK SURGERY     CERVICAL DISC SURGERY     HAND SURGERY     KNEE SURGERY      Social History   Socioeconomic History   Marital status: Widowed    Spouse name: Not on file   Number of children: Not on file   Years of education: Not on file   Highest education level: Not on file  Occupational History   Not on file  Tobacco Use   Smoking status: Never   Smokeless tobacco: Never  Vaping Use   Vaping Use: Never used  Substance and Sexual Activity   Alcohol use: Never   Drug use: Never   Sexual activity: Never  Other Topics Concern   Not on file  Social History Narrative   Not on file   Social Determinants of Health   Financial Resource Strain: Not on file  Food Insecurity: Not on file  Transportation Needs: Not on file  Physical Activity: Not on file  Stress: Not on file  Social Connections: Not on file  Intimate Partner  Violence: Not on file   Family History  Problem Relation Age of Onset   Cardiomyopathy Father    Cancer Mother       VITAL SIGNS BP 109/63   Pulse 85   Temp (!) 97.4 F (36.3 C)   Resp 20   Ht '5\' 2"'$  (1.575 m)   Wt 150 lb 1.6 oz (68.1 kg)   SpO2 91%   BMI 27.45 kg/m   Outpatient Encounter Medications as of 04/16/2021  Medication Sig   acetaminophen (TYLENOL) 325 MG tablet Take 2 tablets (650 mg total) by mouth every 6 (six) hours. (Patient taking differently: Take 650 mg by mouth every 6 (six) hours. Max 3gm acetaminophen/24 hrs from all sources. Resident's request.)   albuterol (VENTOLIN HFA) 108 (90 Base) MCG/ACT inhaler Inhale 1 puff into the lungs every 4 (four) hours as needed for wheezing or shortness of breath.   Amino Acids-Protein Hydrolys (FEEDING SUPPLEMENT, PRO-STAT SUGAR FREE 64,) LIQD Take 30 mLs by mouth 3 (three) times daily with meals.   Artificial Saliva (BIOTENE MOISTURIZING MOUTH MT) Use as directed in the mouth or throat. 1 spray; mucous membrane For Dry Mouth, Three times a day   aspirin 325 MG tablet Take 325 mg by mouth daily.   atorvastatin (LIPITOR)  20 MG tablet Take 1 tablet (20 mg total) by mouth every evening.   chlorthalidone (HYGROTON) 25 MG tablet Take 25 mg by mouth daily.   gabapentin (NEURONTIN) 300 MG capsule Take 1 capsule (300 mg total) by mouth 2 (two) times daily.   insulin glargine (LANTUS) 100 UNIT/ML injection Inject 20 Units into the skin at bedtime.   insulin lispro (HUMALOG) 100 UNIT/ML KwikPen Inject 5 Units into the skin 3 (three) times daily with meals.   melatonin 3 MG TABS tablet Take 3 mg by mouth at bedtime.   mirtazapine (REMERON) 15 MG tablet Take 15 mg by mouth at bedtime.   NON FORMULARY Diet: NAS, Cons CHO   omeprazole (PRILOSEC) 40 MG capsule Take 40 mg by mouth daily.   rOPINIRole (REQUIP) 0.25 MG tablet Take 0.25 mg by mouth at bedtime. for spastic legs   tiZANidine (ZANAFLEX) 4 MG tablet Take 1 tablet (4 mg total) by  mouth every 6 (six) hours as needed for muscle spasms.   [DISCONTINUED] apixaban (ELIQUIS) 2.5 MG TABS tablet Take 2.5 mg by mouth 2 (two) times daily. for elevated D-dimer   [DISCONTINUED] collagenase (SANTYL) ointment Apply 1 application topically. Apply to coccyx wound per tx orders   No facility-administered encounter medications on file as of 04/16/2021.     SIGNIFICANT DIAGNOSTIC EXAMS   PREVIOUS   12-02-20; ct of head: No acute intracranial abnormality. Polypoid sinus disease.  12-02-20: ct of cervical spine:  Postoperative and degenerative changes in the cervical spine. No acute bony abnormality.  12-02-20; ct of pelvis:  1. No evidence of acute fracture or dislocation of the pelvis or hips. 2. 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 3. Degenerative changes in the hips with subcortical cysts on the left hip.  12-02-20: ct of left knee:  1. Subtle acute nondisplaced fracture through the medial aspect of the head of the fibula. 2. 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 3. There is a small joint effusion.  12-02-20: mri left hip:  1. No acute findings involving the left hip. 2. Mild chronic bilateral hip avascular necrosis. Moderate degenerative hip findings bilaterally. No hip effusion or regional bursitis. 3. Mild left greater than right hamstring tendinopathy. 4. 1.3 cm left eccentric Bartholin's cyst or Gartner cyst.  12-02-20: mri lumbar spine:  1. 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 2. L3-4: Bilateral posterolateral disc bulges, more prominent on the left. Mild left foraminal encroachment that could possibly affect the left L3 nerve. Moderate to severe bilateral facet osteoarthritis. These findings could relate to back pain or referred facet syndrome pain. 3. L4-5: Bilateral facet arthropathy with 2 mm of anterolisthesis. Bulging of the disc more prominent towards the right. Mild  narrowing of the lateral recesses and of the intervertebral foramen on the right, but without visible neural compression. Findings at this level could relate to back pain or referred facet syndrome pain. 4. L5-S1: Previous left hemilaminectomy. Endplate osteophytes and bulging of the disc more prominent towards the left. Facet degeneration and hypertrophy left more than right with left foraminal stenosis and subarticular lateral recess stenosis could cause left-sided neural compression.   01-25-21: ct of head; maxillofacial cervical spine:  1. No acute intracranial pathology. 2. No acute/traumatic cervical spine pathology. 3. No acute facial bone fractures.  01-26-21: ct of chest abdomen and pelvis:  1. No noncontrast CT evidence of acute traumatic injury to the chest, abdomen, or pelvis. 2. Distended urinary bladder. Mild bilateral hydronephrosis and hydroureter  to the bilateral ureterovesicular junctions. No obstructing calculus or other etiology identified. Correlate for urinary retention. 3. No fracture or dislocation of the lumbar spine. Moderate disc space height loss and osteophytosis at L5-S1. Probable small broad-based posterior disc bulges at L4-L5 and L5-S1. 4. Coronary artery disease. Aortic Atherosclerosis  01-26-21: ct of right shoulder:  1.  No acute osseous injury of the right shoulder. 2. Mild mineralization in the infraspinatus tendon as can be seen with calcific tendinosis.  01-29-21: MRI of spine:  Postoperative and multilevel degenerative changes of the cervical spine. There is severe canal stenosis with cord compression at C3-C4. Abnormal cord signal is present just below this level (at operative levels) and may reflect edema or myelomalacia No significant degenerative changes of the thoracic spine. Multilevel degenerative changes of lumbar spine similar to recent prior study.  01-30-21: renal ultrasound:  1. No acute abnormality. Small amount of right perinephric fluid,  nonspecific.  02-05-21: chest x-ray:  1. Right internal jugular central venous catheter with tip over the mid SVC. No pneumothorax. 2. Hazy lung base opacities likely represent combination of atelectasis and pleural fluid. Vascular congestion.   03-06-21 chest x-ray: left lower lobe infiltrate likely representing pneumonia   03-26-21: lumbar x-ray: mild to moderate degenerative changes in lumbar spine  03-26-21: no acute bone abnormality   NO NEW EXAMS.    LABS REVIEWED PREVIOUS   12-02-20: wbc 6.4; hgb 11.3; hct 34.4; mcv 93.0 plt 216; glucose 209; bun 33; creat 1.13; k+ 4.3; na++ 137; ca 9.0; GFR 49; hgb a1c 8.6 12-03-20: wbc 6.5; hgb 9.8; hct 30.5 mcv 93.8 plt 182; glucose 188;bun 41; creat 1.49; k+ 4.4; na++ 134; ca 8.2; GFR 35 12-04-20; glucose 285; bun 29; creat 1.00; k+ 4.3; na++ 138; ca 8.7 GFR 56  01-25-21: wbc 6.4; hgb 10.6; hct 31.7; mcv 91.1 plt 227; glucose 152; bun 40; creat 1.2 k+ 4.2; na++ 135; ca 8.7; GFR 42; liver normal albumin 3.5 01-29-21: urine and blood culture: klebsiella pneumoniae 01-31-21: wbc 13.1; hgb 8.3; hct 23.6; mcv 88.1 plt 153; glucose 162; bun 67; creat 2.77; k+ 4.8; na++ 125; ca 7.5; GFR 17; ast 62; alt 57; albumin 1.7 02-04-21: wbc 9.8; hgb 7.6; hct 22.0; mcv 85.3 plt 212; glucose 161 bun 40; creat 1.40; k+ 3.4; na++ 127; ca 7.5; GFR 38; ast 62; alt 57; albumin 1.7  02-08-21: wbc 10.4; hgb 9.2; hct 27.6; mcv 87.3 plt 433; glucose 186; bun 23; creat 1.00 ;k+ 4.4; na++ 131; ca 8.1; GFR 56; ast 60; alt 61; albumin 2.1  02-20-21: hgb 9.4; hct 29.9 glucose 154; bun 51; creat 1.17; k+ 4.5; na++ 136; ca 8.9; GFR 47 02-24-21: glucose 175; bun 62; creat 1.13; k+ 4.5; na++ 133; ca 9.0 GFR 49 03-06-21: wbc 5.3; hgb 8.8; hct 27.1; mcv 93.4 plt 262; glucose 453; bun 66; creat 1.30; k+ 4.6; na++ 135; ca 9.0; GFR 41; d-dimer: 2.16; CRP 3.0   NO NEW LABS.   Review of Systems  Constitutional:  Negative for malaise/fatigue.  Respiratory:  Negative for cough and shortness of breath.    Cardiovascular:  Negative for chest pain, palpitations and leg swelling.  Gastrointestinal:  Negative for abdominal pain, constipation and heartburn.  Musculoskeletal:  Negative for back pain and joint pain.       Bilateral lower extremity pain   Skin: Negative.   Neurological:  Negative for dizziness.  Psychiatric/Behavioral:  The patient is not nervous/anxious.    Physical Exam Constitutional:  General: She is not in acute distress.    Appearance: She is well-developed. She is not diaphoretic.  Neck:     Thyroid: No thyromegaly.  Cardiovascular:     Rate and Rhythm: Normal rate and regular rhythm.     Heart sounds: Normal heart sounds.  Pulmonary:     Effort: Pulmonary effort is normal. No respiratory distress.     Breath sounds: Normal breath sounds.  Abdominal:     General: Bowel sounds are normal. There is no distension.     Palpations: Abdomen is soft.     Tenderness: There is no abdominal tenderness.  Musculoskeletal:     Cervical back: Neck supple.     Right lower leg: No edema.     Left lower leg: No edema.     Comments:  Is able to move upper extremities Has limited movement in lower extremities.     Lymphadenopathy:     Cervical: No cervical adenopathy.  Skin:    General: Skin is warm and dry.     Comments: Wound resolved   Neurological:     Mental Status: She is alert and oriented to person, place, and time.  Psychiatric:        Mood and Affect: Mood normal.     ASSESSMENT/ PLAN:  TODAY  Diabetic peripheral neuropathy RLS (restless leg syndrome) Quadriplegia C1-C4 incomplete  Will increase to requip 0.5 mg nightly  Will increase to gabapentin 300 mg three times daily  Will monitor her status.    [name] NP [facility]  Contact [phone] Monday through Friday 8am- 5pm  After hours call [phone]

## 2021-04-21 DIAGNOSIS — G2581 Restless legs syndrome: Secondary | ICD-10-CM | POA: Insufficient documentation

## 2021-04-30 ENCOUNTER — Non-Acute Institutional Stay (SKILLED_NURSING_FACILITY): Payer: Medicare HMO | Admitting: Internal Medicine

## 2021-04-30 ENCOUNTER — Encounter: Payer: Self-pay | Admitting: Internal Medicine

## 2021-04-30 DIAGNOSIS — E1169 Type 2 diabetes mellitus with other specified complication: Secondary | ICD-10-CM | POA: Diagnosis not present

## 2021-04-30 DIAGNOSIS — E1122 Type 2 diabetes mellitus with diabetic chronic kidney disease: Secondary | ICD-10-CM

## 2021-04-30 DIAGNOSIS — D649 Anemia, unspecified: Secondary | ICD-10-CM

## 2021-04-30 DIAGNOSIS — I152 Hypertension secondary to endocrine disorders: Secondary | ICD-10-CM | POA: Diagnosis not present

## 2021-04-30 DIAGNOSIS — E785 Hyperlipidemia, unspecified: Secondary | ICD-10-CM

## 2021-04-30 DIAGNOSIS — E1159 Type 2 diabetes mellitus with other circulatory complications: Secondary | ICD-10-CM | POA: Diagnosis not present

## 2021-04-30 DIAGNOSIS — Z794 Long term (current) use of insulin: Secondary | ICD-10-CM | POA: Diagnosis not present

## 2021-04-30 DIAGNOSIS — N1831 Chronic kidney disease, stage 3a: Secondary | ICD-10-CM

## 2021-04-30 DIAGNOSIS — E43 Unspecified severe protein-calorie malnutrition: Secondary | ICD-10-CM | POA: Diagnosis not present

## 2021-04-30 NOTE — Assessment & Plan Note (Signed)
BP controlled; no change in antihypertensive medications  

## 2021-04-30 NOTE — Assessment & Plan Note (Signed)
Current H/H 8.5/26.8; prior H/H 8.8/27.1 Indices normochromic,normocytic Anemia essentially stable  No bleeding dyscrasias reported by Staff

## 2021-04-30 NOTE — Assessment & Plan Note (Addendum)
Because of co-morbidities ( HTN , DM ,& atherosclerosis)  LDL goal is < 70. Current LDL 68. No change indicated.

## 2021-04-30 NOTE — Progress Notes (Signed)
NURSING HOME LOCATION:  [facility] ROOM NUMBER:  [id number]  CODE STATUS:  DNR  PCP:  [name] NP  This is a nursing facility follow up visit of chronic medical diagnoses & to document compliance with Regulation 483.30 (c) in [employer] Phase 2 which mandates caregiver visit ( visits can alternate among physician, PA or NP as per statutes) within 10 days of 30 days / 60 days/ 90 days post admission to SNF date    Interim medical record and care since last SNF visit was updated with review of diagnostic studies and change in clinical status since last visit were documented.  HPI: She is a permanent resident of the facility having been admitted after hospitalization for UTI related sepsis.  Medical diagnoses include dyslipidemia and diabetes complicated by neuropathy.  She is on gabapentin. Current labs reveal an LDL of 68 on low-dose statin.  Her anemia has been relatively stable with current values of 8.5/26.8.  Prior H/H were 8.8/27.1.  Indices are normochromic, normocytic.  She has CKD stage IIIa with current creatinine 1.00 and GFR 56.  She does have protein/caloric malnutrition; most recent total protein was 5.5 and albumin 2.1.  Diabetic control is adequate as documented by an A1c of 7.6%.  At her age and with her comorbidities the goal would be less than 8%.  Review of systems: Neurocognitive deficit is suggested as she gave the date as August, 2023.  I asked her the year later during the interview and she again said 2023.  She did identify the POTUS correctly.  Also she stated that she is having right shoulder positional pain which started this morning.  At my evaluation on 6/21 she had the same complaint.  She describes it as positional.  She states that she has had pain in the left shoulder for a month.  All other review of systems was negative.  Constitutional: No fever, significant weight change, fatigue  Eyes: No redness, discharge, pain,  vision change ENT/mouth: No nasal congestion,  purulent discharge, earache, change in hearing, sore throat  Cardiovascular: No chest pain, palpitations, paroxysmal nocturnal dyspnea, claudication, edema  Respiratory: No cough, sputum production, hemoptysis, DOE, significant snoring, apnea   Gastrointestinal: No heartburn, dysphagia, abdominal pain, nausea /vomiting, rectal bleeding, melena, change in bowels Genitourinary: No dysuria, hematuria, pyuria, incontinence, nocturia Dermatologic: No rash, pruritus, change in appearance of skin Neurologic: No dizziness, headache, syncope, seizures, numbness, tingling Psychiatric: No significant anxiety, depression, insomnia, anorexia Endocrine: No change in hair/skin/nails, excessive thirst, excessive hunger, excessive urination  Hematologic/lymphatic: No significant bruising, lymphadenopathy, abnormal bleeding Allergy/immunology: No itchy/watery eyes, significant sneezing, urticaria, angioedema  Physical exam:  Pertinent or positive findings: She appears younger than her stated age.  Facies are blank.  She has complete dentures.  Second heart sound is accentuated.  Foley catheter in place.  Pedal pulses are decreased.  She has trace edema at the sock line.  There is minimal range of motion in the lower extremities.  There is weakness to opposition in the upper extremities and interosseous wasting is present.  General appearance: no acute distress, increased work of breathing is present.   Lymphatic: No lymphadenopathy about the head, neck, axilla. Eyes: No conjunctival inflammation or lid edema is present. There is no scleral icterus. Ears:  External ear exam shows no significant lesions or deformities.   Nose:  External nasal examination shows no deformity or inflammation. Nasal mucosa are pink and moist without lesions, exudates Oral  exam:  Lips and gums are healthy appearing. There is no oropharyngeal erythema or exudate. Neck:  No thyromegaly,  masses, tenderness noted.    Heart:  Normal rate and regular rhythm. S1 normal without gallop, murmur, click, rub .  Lungs: without wheezes, rhonchi, rales, rubs. Abdomen: Bowel sounds are normal. Abdomen is soft and nontender with no organomegaly, hernias, masses. GU: Deferred  Extremities:  No cyanosis, clubbing  Neurologic exam :Balance, Rhomberg, finger to nose testing could not be completed due to clinical state Skin: Warm & dry w/o tenting. No significant lesions or rash.  See summary under each active problem in the Problem List with associated updated therapeutic plan

## 2021-04-30 NOTE — Assessment & Plan Note (Addendum)
DM with neuro , renal, & vascular complications Current 123456: 7.6% A1c goal : <8% No hypoglycemia No change indicated Current creatinine 1.00/GFR 56 ; CKD Stage 3a Medication List reviewed. If CKD progresses Gabapentin will be weaned or discontinued.

## 2021-04-30 NOTE — Patient Instructions (Signed)
See assessment and plan under each diagnosis in the problem list and acutely for this visit 

## 2021-04-30 NOTE — Assessment & Plan Note (Signed)
Protein 5.5/ albumin 2.1. Nutritionist to monitor.

## 2021-05-02 ENCOUNTER — Non-Acute Institutional Stay (SKILLED_NURSING_FACILITY): Payer: Medicare HMO | Admitting: Adult Health

## 2021-05-02 ENCOUNTER — Encounter: Payer: Self-pay | Admitting: Adult Health

## 2021-05-02 DIAGNOSIS — M25512 Pain in left shoulder: Secondary | ICD-10-CM | POA: Diagnosis not present

## 2021-05-02 DIAGNOSIS — S14124S Central cord syndrome at C4 level of cervical spinal cord, sequela: Secondary | ICD-10-CM

## 2021-05-02 NOTE — Progress Notes (Signed)
Location:  [facility] Room Number: 119-P Place of Service:  SNF (31)   CODE STATUS: DNR  Allergies  Allergen Reactions   Codeine     Chief Complaint  Patient presents with   Acute Visit    Left shoulder pain    HPI:  She has been having left shoulder pain for the past several weeks. She describes the pain as sharp; throbbing. There is no radiation present. The pain gets worse with movement; sitting still relieves pain. She rates the pain 9/10. There are no indications of distress present. She does have limited range of motion to her left shoulder.   Past Medical History:  Diagnosis Date   DM type 2 with diabetic peripheral neuropathy (HCC)    High cholesterol    Neck pain    Neuropathy     Past Surgical History:  Procedure Laterality Date   ABDOMINAL HYSTERECTOMY     ANTERIOR CERVICAL DECOMP/DISCECTOMY FUSION N/A 02/05/2021   Procedure: Cervical Three-Four  Anterior cervical decompression/discectomy/fusion;  Surgeon: Ashok Pall, MD;  Location: [facility];  Service: Neurosurgery;  Laterality: N/A;  RM 20   APPENDECTOMY     BACK SURGERY     CERVICAL DISC SURGERY     HAND SURGERY     KNEE SURGERY      Social History   Socioeconomic History   Marital status: Widowed    Spouse name: Not on file   Number of children: Not on file   Years of education: Not on file   Highest education level: Not on file  Occupational History   Not on file  Tobacco Use   Smoking status: Never   Smokeless tobacco: Never  Vaping Use   Vaping Use: Never used  Substance and Sexual Activity   Alcohol use: Never   Drug use: Never   Sexual activity: Never  Other Topics Concern   Not on file  Social History Narrative   Not on file   Social Determinants of Health   Financial Resource Strain: Not on file  Food Insecurity: Not on file  Transportation Needs: Not on file  Physical Activity: Not on file  Stress: Not on file  Social Connections: Not on file   Intimate Partner Violence: Not on file   Family History  Problem Relation Age of Onset   Cardiomyopathy Father    Cancer Mother       VITAL SIGNS BP 128/66   Pulse 65   Temp (!) 97.3 F (36.3 C)   Resp 20   Ht '5\' 2"'$  (1.575 m)   Wt 155 lb 9.6 oz (70.6 kg)   SpO2 97%   BMI 28.46 kg/m   Outpatient Encounter Medications as of 05/02/2021  Medication Sig   acetaminophen (TYLENOL) 325 MG tablet Take 650 mg by mouth in the morning, at noon, in the evening, and at bedtime. Max 3gm acetaminophen/24 hrs from all sources.for left shoulder pain   albuterol (VENTOLIN HFA) 108 (90 Base) MCG/ACT inhaler Inhale 1 puff into the lungs every 4 (four) hours as needed for wheezing or shortness of breath.   Amino Acids-Protein Hydrolys (FEEDING SUPPLEMENT, PRO-STAT SUGAR FREE 64,) LIQD Take 30 mLs by mouth 3 (three) times daily with meals.   Artificial Saliva (BIOTENE MOISTURIZING MOUTH MT) Use as directed in the mouth or throat. 1 spray; mucous membrane For Dry Mouth, Three times a day   aspirin 325 MG tablet Take 325 mg by mouth daily.   atorvastatin (LIPITOR) 20 MG  tablet Take 1 tablet (20 mg total) by mouth every evening.   Balsam [place]-Castor Oil (VENELEX) OINT Apply topically. Apply to sacrum, coccyx, bilateral buttocks qshift for prevention.   chlorthalidone (HYGROTON) 25 MG tablet Take 25 mg by mouth daily.   gabapentin (NEURONTIN) 300 MG capsule Take 1 capsule (300 mg total) by mouth 2 (two) times daily.   insulin glargine (LANTUS) 100 UNIT/ML injection Inject 20 Units into the skin at bedtime.   insulin lispro (HUMALOG) 100 UNIT/ML KwikPen Inject 5 Units into the skin 3 (three) times daily with meals.   melatonin 3 MG TABS tablet Take 3 mg by mouth at bedtime.   mirtazapine (REMERON) 15 MG tablet Take 15 mg by mouth at bedtime.   NON FORMULARY Diet: NAS, Cons CHO   omeprazole (PRILOSEC) 40 MG capsule Take 40 mg by mouth daily.   rOPINIRole (REQUIP) 0.25 MG tablet Take 0.25 mg by mouth at  bedtime. for spastic legs   tiZANidine (ZANAFLEX) 4 MG tablet Take 1 tablet (4 mg total) by mouth every 6 (six) hours as needed for muscle spasms.   acetaminophen (TYLENOL) 325 MG tablet Take 2 tablets (650 mg total) by mouth every 6 (six) hours. (Patient taking differently: Take 650 mg by mouth every 6 (six) hours. Max 3gm acetaminophen/24 hrs from all sources. Resident's request.)   No facility-administered encounter medications on file as of 05/02/2021.     SIGNIFICANT DIAGNOSTIC EXAMS  PREVIOUS   12-02-20; ct of head: No acute intracranial abnormality. Polypoid sinus disease.  12-02-20: ct of cervical spine:  Postoperative and degenerative changes in the cervical spine. No acute bony abnormality.  12-02-20; ct of pelvis:  1. No evidence of acute fracture or dislocation of the pelvis or hips. 2. 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 3. Degenerative changes in the hips with subcortical cysts on the left hip.  12-02-20: ct of left knee:  1. Subtle acute nondisplaced fracture through the medial aspect of the head of the fibula. 2. 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 3. There is a small joint effusion.  12-02-20: mri left hip:  1. No acute findings involving the left hip. 2. Mild chronic bilateral hip avascular necrosis. Moderate degenerative hip findings bilaterally. No hip effusion or regional bursitis. 3. Mild left greater than right hamstring tendinopathy. 4. 1.3 cm left eccentric Bartholin's cyst or Gartner cyst.  12-02-20: mri lumbar spine:  1. 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 2. L3-4: Bilateral posterolateral disc bulges, more prominent on the left. Mild left foraminal encroachment that could possibly affect the left L3 nerve. Moderate to severe bilateral facet osteoarthritis. These findings could relate to back pain or referred facet syndrome pain. 3. L4-5: Bilateral facet  arthropathy with 2 mm of anterolisthesis. Bulging of the disc more prominent towards the right. Mild narrowing of the lateral recesses and of the intervertebral foramen on the right, but without visible neural compression. Findings at this level could relate to back pain or referred facet syndrome pain. 4. L5-S1: Previous left hemilaminectomy. Endplate osteophytes and bulging of the disc more prominent towards the left. Facet degeneration and hypertrophy left more than right with left foraminal stenosis and subarticular lateral recess stenosis could cause left-sided neural compression.   01-25-21: ct of head; maxillofacial cervical spine:  1. No acute intracranial pathology. 2. No acute/traumatic cervical spine pathology. 3. No acute facial bone fractures.  01-26-21: ct of chest abdomen and pelvis:  1. No noncontrast CT evidence of acute  traumatic injury to the chest, abdomen, or pelvis. 2. Distended urinary bladder. Mild bilateral hydronephrosis and hydroureter to the bilateral ureterovesicular junctions. No obstructing calculus or other etiology identified. Correlate for urinary retention. 3. No fracture or dislocation of the lumbar spine. Moderate disc space height loss and osteophytosis at L5-S1. Probable small broad-based posterior disc bulges at L4-L5 and L5-S1. 4. Coronary artery disease. Aortic Atherosclerosis  01-26-21: ct of right shoulder:  1.  No acute osseous injury of the right shoulder. 2. Mild mineralization in the infraspinatus tendon as can be seen with calcific tendinosis.  01-29-21: MRI of spine:  Postoperative and multilevel degenerative changes of the cervical spine. There is severe canal stenosis with cord compression at C3-C4. Abnormal cord signal is present just below this level (at operative levels) and may reflect edema or myelomalacia No significant degenerative changes of the thoracic spine. Multilevel degenerative changes of lumbar spine similar to recent prior  study.  01-30-21: renal ultrasound:  1. No acute abnormality. Small amount of right perinephric fluid, nonspecific.  02-05-21: chest x-ray:  1. Right internal jugular central venous catheter with tip over the mid SVC. No pneumothorax. 2. Hazy lung base opacities likely represent combination of atelectasis and pleural fluid. Vascular congestion.   03-06-21 chest x-ray: left lower lobe infiltrate likely representing pneumonia   03-26-21: lumbar x-ray: mild to moderate degenerative changes in lumbar spine  03-26-21: no acute bone abnormality   NO NEW EXAMS.    LABS REVIEWED PREVIOUS   12-02-20: wbc 6.4; hgb 11.3; hct 34.4; mcv 93.0 plt 216; glucose 209; bun 33; creat 1.13; k+ 4.3; na++ 137; ca 9.0; GFR 49; hgb a1c 8.6 12-03-20: wbc 6.5; hgb 9.8; hct 30.5 mcv 93.8 plt 182; glucose 188;bun 41; creat 1.49; k+ 4.4; na++ 134; ca 8.2; GFR 35 12-04-20; glucose 285; bun 29; creat 1.00; k+ 4.3; na++ 138; ca 8.7 GFR 56  01-25-21: wbc 6.4; hgb 10.6; hct 31.7; mcv 91.1 plt 227; glucose 152; bun 40; creat 1.2 k+ 4.2; na++ 135; ca 8.7; GFR 42; liver normal albumin 3.5 01-29-21: urine and blood culture: klebsiella pneumoniae 01-31-21: wbc 13.1; hgb 8.3; hct 23.6; mcv 88.1 plt 153; glucose 162; bun 67; creat 2.77; k+ 4.8; na++ 125; ca 7.5; GFR 17; ast 62; alt 57; albumin 1.7 02-04-21: wbc 9.8; hgb 7.6; hct 22.0; mcv 85.3 plt 212; glucose 161 bun 40; creat 1.40; k+ 3.4; na++ 127; ca 7.5; GFR 38; ast 62; alt 57; albumin 1.7  02-08-21: wbc 10.4; hgb 9.2; hct 27.6; mcv 87.3 plt 433; glucose 186; bun 23; creat 1.00 ;k+ 4.4; na++ 131; ca 8.1; GFR 56; ast 60; alt 61; albumin 2.1  02-20-21: hgb 9.4; hct 29.9 glucose 154; bun 51; creat 1.17; k+ 4.5; na++ 136; ca 8.9; GFR 47 02-24-21: glucose 175; bun 62; creat 1.13; k+ 4.5; na++ 133; ca 9.0 GFR 49 03-06-21: wbc 5.3; hgb 8.8; hct 27.1; mcv 93.4 plt 262; glucose 453; bun 66; creat 1.30; k+ 4.6; na++ 135; ca 9.0; GFR 41; d-dimer: 2.16; CRP 3.0   NO NEW LABS.   Review of Systems   Constitutional:  Negative for malaise/fatigue.  Respiratory:  Negative for cough and shortness of breath.   Cardiovascular:  Negative for chest pain, palpitations and leg swelling.  Gastrointestinal:  Negative for abdominal pain, constipation and heartburn.  Musculoskeletal:  Positive for joint pain. Negative for back pain and myalgias.       Left shoulder pain   Skin: Negative.   Neurological:  Negative for dizziness.  Psychiatric/Behavioral:  The patient is not nervous/anxious.     Physical Exam Constitutional:      General: She is not in acute distress.    Appearance: She is well-developed. She is not diaphoretic.  Neck:     Thyroid: No thyromegaly.  Cardiovascular:     Rate and Rhythm: Normal rate and regular rhythm.     Pulses: Normal pulses.     Heart sounds: Normal heart sounds.  Pulmonary:     Effort: Pulmonary effort is normal. No respiratory distress.     Breath sounds: Normal breath sounds.  Abdominal:     General: Bowel sounds are normal. There is no distension.     Palpations: Abdomen is soft.     Tenderness: There is no abdominal tenderness.  Musculoskeletal:     Cervical back: Neck supple.     Right lower leg: No edema.     Left lower leg: No edema.     Comments:  Is able to move upper extremities Has limited movement in lower extremities.      Lymphadenopathy:     Cervical: No cervical adenopathy.  Skin:    General: Skin is warm and dry.  Neurological:     Mental Status: She is alert and oriented to person, place, and time.  Psychiatric:        Mood and Affect: Mood normal.     ASSESSMENT/ PLAN:  TODAY  Central cord syndrome at C4 level of cervical spinal cord sequela  Will change to tylenol 650 mg four times daily will monitor her status.      [name] NP [facility]  Contact [phone] Monday through Friday 8am- 5pm  After hours call [phone]

## 2021-05-06 ENCOUNTER — Ambulatory Visit: Payer: Medicare HMO | Admitting: Urology

## 2021-05-09 ENCOUNTER — Encounter: Payer: Self-pay | Admitting: Adult Health

## 2021-05-09 ENCOUNTER — Ambulatory Visit (INDEPENDENT_AMBULATORY_CARE_PROVIDER_SITE_OTHER): Payer: Medicare HMO | Admitting: Urology

## 2021-05-09 ENCOUNTER — Non-Acute Institutional Stay (SKILLED_NURSING_FACILITY): Payer: Medicare HMO | Admitting: Adult Health

## 2021-05-09 VITALS — BP 125/69 | HR 70

## 2021-05-09 DIAGNOSIS — G2581 Restless legs syndrome: Secondary | ICD-10-CM

## 2021-05-09 DIAGNOSIS — R339 Retention of urine, unspecified: Secondary | ICD-10-CM | POA: Diagnosis not present

## 2021-05-09 NOTE — Progress Notes (Signed)
Urological Symptom Review  Patient is experiencing the following symptoms: Patient has cath Injury to kidney or bladder  Review of Systems  Gastrointestinal (upper)  : Negative for upper GI symptoms  Gastrointestinal (lower) : Negative for lower GI symptoms  Constitutional : Negative for symptoms  Skin: Negative for skin symptoms  Eyes: Negative for eye symptoms  Ear/Nose/Throat : Negative for Ear/Nose/Throat symptoms  Hematologic/Lymphatic: Negative for Hematologic/Lymphatic symptoms  Cardiovascular : Negative for cardiovascular symptoms  Respiratory : Negative for respiratory symptoms  Endocrine: Negative for endocrine symptoms  Musculoskeletal: Negative for musculoskeletal symptoms  Neurological: Negative for neurological symptoms  Psychologic: Negative for psychiatric symptoms

## 2021-05-09 NOTE — Progress Notes (Signed)
Location:  [facility] Room Number: 119-P Place of Service:  SNF (31)   CODE STATUS: DNR  Allergies  Allergen Reactions   Codeine     Chief Complaint  Patient presents with   Acute Visit    Pain management     HPI:  She continues to have jerking of her legs at night. She is presenting taking requip 0.25 mg nightly. She states that her legs are keeping her awake at night. She states that the "night medicine" is not working to control the jerky movements.   Past Medical History:  Diagnosis Date   DM type 2 with diabetic peripheral neuropathy (HCC)    High cholesterol    Neck pain    Neuropathy     Past Surgical History:  Procedure Laterality Date   ABDOMINAL HYSTERECTOMY     ANTERIOR CERVICAL DECOMP/DISCECTOMY FUSION N/A 02/05/2021   Procedure: Cervical Three-Four  Anterior cervical decompression/discectomy/fusion;  Surgeon: Ashok Pall, MD;  Location: [facility];  Service: Neurosurgery;  Laterality: N/A;  RM 20   APPENDECTOMY     BACK SURGERY     CERVICAL DISC SURGERY     HAND SURGERY     KNEE SURGERY      Social History   Socioeconomic History   Marital status: Widowed    Spouse name: Not on file   Number of children: Not on file   Years of education: Not on file   Highest education level: Not on file  Occupational History   Not on file  Tobacco Use   Smoking status: Never   Smokeless tobacco: Never  Vaping Use   Vaping Use: Never used  Substance and Sexual Activity   Alcohol use: Never   Drug use: Never   Sexual activity: Never  Other Topics Concern   Not on file  Social History Narrative   Not on file   Social Determinants of Health   Financial Resource Strain: Not on file  Food Insecurity: Not on file  Transportation Needs: Not on file  Physical Activity: Not on file  Stress: Not on file  Social Connections: Not on file  Intimate Partner Violence: Not on file   Family History  Problem Relation Age of Onset    Cardiomyopathy Father    Cancer Mother       VITAL SIGNS BP 130/70   Pulse 69   Temp 98.1 F (36.7 C)   Resp 20   Ht '5\' 2"'$  (1.575 m)   Wt 155 lb 9.6 oz (70.6 kg)   SpO2 97%   BMI 28.46 kg/m   Outpatient Encounter Medications as of 05/09/2021  Medication Sig   acetaminophen (TYLENOL) 325 MG tablet Take 650 mg by mouth in the morning, at noon, in the evening, and at bedtime. Max 3gm acetaminophen/24 hrs from all sources.for left shoulder pain   albuterol (VENTOLIN HFA) 108 (90 Base) MCG/ACT inhaler Inhale 1 puff into the lungs every 4 (four) hours as needed for wheezing or shortness of breath.   Amino Acids-Protein Hydrolys (FEEDING SUPPLEMENT, PRO-STAT SUGAR FREE 64,) LIQD Take 30 mLs by mouth 3 (three) times daily with meals.   Artificial Saliva (BIOTENE MOISTURIZING MOUTH MT) Use as directed in the mouth or throat. 1 spray; mucous membrane For Dry Mouth, Three times a day   aspirin 325 MG tablet Take 325 mg by mouth daily.   atorvastatin (LIPITOR) 20 MG tablet Take 1 tablet (20 mg total) by mouth every evening.   Balsam [place]-Castor  Oil (VENELEX) OINT Apply topically. Apply to sacrum, coccyx, bilateral buttocks qshift for prevention.   chlorthalidone (HYGROTON) 25 MG tablet Take 25 mg by mouth daily.   gabapentin (NEURONTIN) 300 MG capsule Take 300 mg by mouth 3 (three) times daily.   insulin glargine (LANTUS) 100 UNIT/ML injection Inject 20 Units into the skin at bedtime.   insulin lispro (HUMALOG) 100 UNIT/ML KwikPen Inject 5 Units into the skin 3 (three) times daily with meals.   melatonin 3 MG TABS tablet Take 3 mg by mouth at bedtime.   mirtazapine (REMERON) 15 MG tablet Take 15 mg by mouth at bedtime.   NON FORMULARY Diet: NAS, Cons CHO   omeprazole (PRILOSEC) 40 MG capsule Take 40 mg by mouth daily.   rOPINIRole (REQUIP) 0.25 MG tablet Take 0.25 mg by mouth at bedtime. for spastic legs   tiZANidine (ZANAFLEX) 2 MG tablet Take 2 mg by mouth every 6 (six) hours as needed for  muscle spasms (for back and sciatic pain).   [DISCONTINUED] acetaminophen (TYLENOL) 325 MG tablet Take 2 tablets (650 mg total) by mouth every 6 (six) hours. (Patient taking differently: Take 650 mg by mouth every 6 (six) hours. Max 3gm acetaminophen/24 hrs from all sources. Resident's request.)   [DISCONTINUED] gabapentin (NEURONTIN) 300 MG capsule Take 1 capsule (300 mg total) by mouth 2 (two) times daily.   [DISCONTINUED] tiZANidine (ZANAFLEX) 4 MG tablet Take 1 tablet (4 mg total) by mouth every 6 (six) hours as needed for muscle spasms.   No facility-administered encounter medications on file as of 05/09/2021.     SIGNIFICANT DIAGNOSTIC EXAMS  PREVIOUS   12-02-20; ct of head: No acute intracranial abnormality. Polypoid sinus disease.  12-02-20: ct of cervical spine:  Postoperative and degenerative changes in the cervical spine. No acute bony abnormality.  12-02-20; ct of pelvis:  1. No evidence of acute fracture or dislocation of the pelvis or hips. 2. 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 3. Degenerative changes in the hips with subcortical cysts on the left hip.  12-02-20: ct of left knee:  1. Subtle acute nondisplaced fracture through the medial aspect of the head of the fibula. 2. 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 3. There is a small joint effusion.  12-02-20: mri left hip:  1. No acute findings involving the left hip. 2. Mild chronic bilateral hip avascular necrosis. Moderate degenerative hip findings bilaterally. No hip effusion or regional bursitis. 3. Mild left greater than right hamstring tendinopathy. 4. 1.3 cm left eccentric Bartholin's cyst or Gartner cyst.  12-02-20: mri lumbar spine:  1. 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 2. L3-4: Bilateral posterolateral disc bulges, more prominent on the left. Mild left foraminal encroachment that could possibly affect the left L3  nerve. Moderate to severe bilateral facet osteoarthritis. These findings could relate to back pain or referred facet syndrome pain. 3. L4-5: Bilateral facet arthropathy with 2 mm of anterolisthesis. Bulging of the disc more prominent towards the right. Mild narrowing of the lateral recesses and of the intervertebral foramen on the right, but without visible neural compression. Findings at this level could relate to back pain or referred facet syndrome pain. 4. L5-S1: Previous left hemilaminectomy. Endplate osteophytes and bulging of the disc more prominent towards the left. Facet degeneration and hypertrophy left more than right with left foraminal stenosis and subarticular lateral recess stenosis could cause left-sided neural compression.   01-25-21: ct of head; maxillofacial cervical spine:  1. No acute  intracranial pathology. 2. No acute/traumatic cervical spine pathology. 3. No acute facial bone fractures.  01-26-21: ct of chest abdomen and pelvis:  1. No noncontrast CT evidence of acute traumatic injury to the chest, abdomen, or pelvis. 2. Distended urinary bladder. Mild bilateral hydronephrosis and hydroureter to the bilateral ureterovesicular junctions. No obstructing calculus or other etiology identified. Correlate for urinary retention. 3. No fracture or dislocation of the lumbar spine. Moderate disc space height loss and osteophytosis at L5-S1. Probable small broad-based posterior disc bulges at L4-L5 and L5-S1. 4. Coronary artery disease. Aortic Atherosclerosis  01-26-21: ct of right shoulder:  1.  No acute osseous injury of the right shoulder. 2. Mild mineralization in the infraspinatus tendon as can be seen with calcific tendinosis.  01-29-21: MRI of spine:  Postoperative and multilevel degenerative changes of the cervical spine. There is severe canal stenosis with cord compression at C3-C4. Abnormal cord signal is present just below this level (at operative levels) and may reflect  edema or myelomalacia No significant degenerative changes of the thoracic spine. Multilevel degenerative changes of lumbar spine similar to recent prior study.  01-30-21: renal ultrasound:  1. No acute abnormality. Small amount of right perinephric fluid, nonspecific.  02-05-21: chest x-ray:  1. Right internal jugular central venous catheter with tip over the mid SVC. No pneumothorax. 2. Hazy lung base opacities likely represent combination of atelectasis and pleural fluid. Vascular congestion.   03-06-21 chest x-ray: left lower lobe infiltrate likely representing pneumonia   03-26-21: lumbar x-ray: mild to moderate degenerative changes in lumbar spine  03-26-21: no acute bone abnormality   NO NEW EXAMS.    LABS REVIEWED PREVIOUS   12-02-20: wbc 6.4; hgb 11.3; hct 34.4; mcv 93.0 plt 216; glucose 209; bun 33; creat 1.13; k+ 4.3; na++ 137; ca 9.0; GFR 49; hgb a1c 8.6 12-03-20: wbc 6.5; hgb 9.8; hct 30.5 mcv 93.8 plt 182; glucose 188;bun 41; creat 1.49; k+ 4.4; na++ 134; ca 8.2; GFR 35 12-04-20; glucose 285; bun 29; creat 1.00; k+ 4.3; na++ 138; ca 8.7 GFR 56  01-25-21: wbc 6.4; hgb 10.6; hct 31.7; mcv 91.1 plt 227; glucose 152; bun 40; creat 1.2 k+ 4.2; na++ 135; ca 8.7; GFR 42; liver normal albumin 3.5 01-29-21: urine and blood culture: klebsiella pneumoniae 01-31-21: wbc 13.1; hgb 8.3; hct 23.6; mcv 88.1 plt 153; glucose 162; bun 67; creat 2.77; k+ 4.8; na++ 125; ca 7.5; GFR 17; ast 62; alt 57; albumin 1.7 02-04-21: wbc 9.8; hgb 7.6; hct 22.0; mcv 85.3 plt 212; glucose 161 bun 40; creat 1.40; k+ 3.4; na++ 127; ca 7.5; GFR 38; ast 62; alt 57; albumin 1.7  02-08-21: wbc 10.4; hgb 9.2; hct 27.6; mcv 87.3 plt 433; glucose 186; bun 23; creat 1.00 ;k+ 4.4; na++ 131; ca 8.1; GFR 56; ast 60; alt 61; albumin 2.1  02-20-21: hgb 9.4; hct 29.9 glucose 154; bun 51; creat 1.17; k+ 4.5; na++ 136; ca 8.9; GFR 47 02-24-21: glucose 175; bun 62; creat 1.13; k+ 4.5; na++ 133; ca 9.0 GFR 49 03-06-21: wbc 5.3; hgb 8.8; hct 27.1; mcv  93.4 plt 262; glucose 453; bun 66; creat 1.30; k+ 4.6; na++ 135; ca 9.0; GFR 41; d-dimer: 2.16; CRP 3.0   NO NEW LABS.   Review of Systems  Constitutional:  Negative for malaise/fatigue.  Respiratory:  Negative for cough and shortness of breath.   Cardiovascular:  Negative for chest pain, palpitations and leg swelling.  Gastrointestinal:  Negative for abdominal pain, constipation and heartburn.  Musculoskeletal:  Negative for back pain, joint pain and myalgias.  Skin: Negative.   Neurological:  Negative for dizziness.       Legs jerking at night   Psychiatric/Behavioral:  The patient is not nervous/anxious.     Physical Exam Constitutional:      General: She is not in acute distress.    Appearance: She is well-developed. She is not diaphoretic.  Neck:     Thyroid: No thyromegaly.  Cardiovascular:     Rate and Rhythm: Normal rate and regular rhythm.     Pulses: Normal pulses.     Heart sounds: Normal heart sounds.  Pulmonary:     Effort: Pulmonary effort is normal. No respiratory distress.     Breath sounds: Normal breath sounds.  Abdominal:     General: Bowel sounds are normal. There is no distension.     Palpations: Abdomen is soft.     Tenderness: There is no abdominal tenderness.  Musculoskeletal:     Cervical back: Neck supple.     Right lower leg: No edema.     Left lower leg: No edema.  Lymphadenopathy:     Cervical: No cervical adenopathy.  Skin:    General: Skin is warm and dry.  Neurological:     Mental Status: She is alert and oriented to person, place, and time.  Psychiatric:        Mood and Affect: Mood normal.     ASSESSMENT/ PLAN:  TODAY  Restless leg syndrome:  is worse will increase requip to 1 mg nightly    [name] NP [facility]  Contact [phone] Monday through Friday 8am- 5pm  After hours call [phone]

## 2021-05-09 NOTE — Progress Notes (Signed)
05/09/2021 11:47 AM   [name] 1935/10/27 [id number]  Referring provider: Curlene Labrum, MD [place],  [place] [id number]  Urinary retention   HPI: [name] is a 85yo here for evaluation of urinary retention. She suffered C4 SCI in 12/2020 and has had an indwelling foley since then which has not been changed. She is in a wheelchair and has very poor mobility. No issues with urination prior to her injury   PMH: Past Medical History:  Diagnosis Date   DM type 2 with diabetic peripheral neuropathy ([place])    High cholesterol    Neck pain    Neuropathy     Surgical History: Past Surgical History:  Procedure Laterality Date   ABDOMINAL HYSTERECTOMY     ANTERIOR CERVICAL DECOMP/DISCECTOMY FUSION N/A 02/05/2021   Procedure: Cervical Three-Four  Anterior cervical decompression/discectomy/fusion;  Surgeon: Ashok Pall, MD;  Location: [facility];  Service: Neurosurgery;  Laterality: N/A;  RM 20   APPENDECTOMY     BACK SURGERY     CERVICAL DISC SURGERY     HAND SURGERY     KNEE SURGERY      Home Medications:  Allergies as of 05/09/2021       Reactions   Codeine         Medication List      Notice   This visit is during an admission. Changes to the med list made in this visit will be reflected in the After Visit Summary of the admission.     Allergies:  Allergies  Allergen Reactions   Codeine     Family History: Family History  Problem Relation Age of Onset   Cardiomyopathy Father    Cancer Mother     Social History:  reports that she has never smoked. She has never used smokeless tobacco. She reports that she does not drink alcohol and does not use drugs.  ROS: All other review of systems were reviewed and are negative except what is noted above in HPI  Physical Exam: BP 125/69   Pulse 70   Constitutional:  Alert and oriented, No acute distress. HEENT: [place] AT, moist mucus membranes.  Trachea midline, no masses. Cardiovascular: No clubbing,  cyanosis, or edema. Respiratory: Normal respiratory effort, no increased work of breathing. GI: Abdomen is soft, nontender, nondistended, no abdominal masses GU: No CVA tenderness.  Lymph: No cervical or inguinal lymphadenopathy. Skin: No rashes, bruises or suspicious lesions. Neurologic: Grossly intact, no focal deficits, moving all 4 extremities. Psychiatric: Normal mood and affect.  Laboratory Data: Lab Results  Component Value Date   WBC 5.5 03/31/2021   HGB 8.5 (L) 03/31/2021   HCT 26.8 (L) 03/31/2021   MCV 93.4 03/31/2021   PLT 203 03/31/2021    Lab Results  Component Value Date   CREATININE 1.30 (H) 03/06/2021    No results found for: PSA  No results found for: TESTOSTERONE  Lab Results  Component Value Date   HGBA1C 7.6 (H) 03/31/2021    Urinalysis    Component Value Date/Time   COLORURINE YELLOW 01/29/2021 1435   APPEARANCEUR CLOUDY (A) 01/29/2021 1435   LABSPEC 1.013 01/29/2021 1435   PHURINE 5.0 01/29/2021 1435   GLUCOSEU NEGATIVE 01/29/2021 1435   HGBUR MODERATE (A) 01/29/2021 1435   BILIRUBINUR NEGATIVE 01/29/2021 1435   KETONESUR NEGATIVE 01/29/2021 1435   PROTEINUR NEGATIVE 01/29/2021 1435   NITRITE NEGATIVE 01/29/2021 1435   LEUKOCYTESUR LARGE (A) 01/29/2021 1435    Lab Results  Component  Value Date   BACTERIA MANY (A) 01/29/2021    Pertinent Imaging:  No results found for this or any previous visit.  No results found for this or any previous visit.  No results found for this or any previous visit.  No results found for this or any previous visit.  Results for orders placed during the hospital encounter of 01/26/21  US RENAL  Narrative CLINICAL DATA:  Acute kidney injury.  EXAM: RENAL / URINARY TRACT ULTRASOUND COMPLETE  COMPARISON:  CT abdomen pelvis dated Jan 26, 2021.  FINDINGS: Right Kidney:  Renal measurements: 10.6 x 5.1 x 5.6 cm = volume: 157 mL. Echogenicity within normal limits. No mass or  hydronephrosis visualized. Small amount of perinephric fluid.  Left Kidney:  Renal measurements: 10.4 x 6.2 x 7.0 cm = volume: 232 mL. Echogenicity within normal limits. No mass or hydronephrosis visualized.  Bladder:  Decompressed by Foley catheter.  Other:  None.  IMPRESSION: 1. No acute abnormality. Small amount of right perinephric fluid, nonspecific.   Electronically Signed By: Titus Dubin M.D. On: 01/30/2021 11:15  No results found for this or any previous visit.  No results found for this or any previous visit.  No results found for this or any previous visit.   Assessment & Plan:    1. Urinary retention -Continue indwelling foley. She should continue monthly foley changes.    No follow-ups on file.  Nicolette Bang, MD  [facility] [place]

## 2021-05-21 DIAGNOSIS — S14124D Central cord syndrome at C4 level of cervical spinal cord, subsequent encounter: Secondary | ICD-10-CM | POA: Diagnosis not present

## 2021-05-21 DIAGNOSIS — R279 Unspecified lack of coordination: Secondary | ICD-10-CM | POA: Diagnosis not present

## 2021-05-21 DIAGNOSIS — M6281 Muscle weakness (generalized): Secondary | ICD-10-CM | POA: Diagnosis not present

## 2021-05-22 ENCOUNTER — Encounter: Payer: Self-pay | Admitting: Adult Health

## 2021-05-22 ENCOUNTER — Non-Acute Institutional Stay (SKILLED_NURSING_FACILITY): Payer: Medicare HMO | Admitting: Adult Health

## 2021-05-22 DIAGNOSIS — I152 Hypertension secondary to endocrine disorders: Secondary | ICD-10-CM

## 2021-05-22 DIAGNOSIS — E1169 Type 2 diabetes mellitus with other specified complication: Secondary | ICD-10-CM | POA: Diagnosis not present

## 2021-05-22 DIAGNOSIS — R279 Unspecified lack of coordination: Secondary | ICD-10-CM | POA: Diagnosis not present

## 2021-05-22 DIAGNOSIS — K5909 Other constipation: Secondary | ICD-10-CM

## 2021-05-22 DIAGNOSIS — E785 Hyperlipidemia, unspecified: Secondary | ICD-10-CM

## 2021-05-22 DIAGNOSIS — K219 Gastro-esophageal reflux disease without esophagitis: Secondary | ICD-10-CM | POA: Diagnosis not present

## 2021-05-22 DIAGNOSIS — E1159 Type 2 diabetes mellitus with other circulatory complications: Secondary | ICD-10-CM

## 2021-05-22 DIAGNOSIS — M6281 Muscle weakness (generalized): Secondary | ICD-10-CM | POA: Diagnosis not present

## 2021-05-22 DIAGNOSIS — S14124D Central cord syndrome at C4 level of cervical spinal cord, subsequent encounter: Secondary | ICD-10-CM | POA: Diagnosis not present

## 2021-05-22 NOTE — Progress Notes (Signed)
Location:  [place] Room Number: 138-P Place of Service:  SNF (31) Provider: Gerlene Fee, NP  CODE STATUS: DNR  Allergies  Allergen Reactions   Codeine     Chief Complaint  Patient presents with   Medical Management of Chronic Issues         Hyperlipidemia associated with type 2 diabetes mellitus: Hypertension associated with type 2 diabetes mellitus:  GERD without esophagitis:Chronic constipation:     HPI:  She is a 85 year old long term resident of this facility being seen for the management of her chronic illnesses: Hyperlipidemia associated with type 2 diabetes mellitus: Hypertension associated with type 2 diabetes mellitus:  GERD without esophagitis:Chronic constipation. There are no reports of uncontrolled pain. There are no reports of changes in appetite; no anxiety or insomnia present.   Past Medical History:  Diagnosis Date   DM type 2 with diabetic peripheral neuropathy (HCC)    High cholesterol    Neck pain    Neuropathy     Past Surgical History:  Procedure Laterality Date   ABDOMINAL HYSTERECTOMY     ANTERIOR CERVICAL DECOMP/DISCECTOMY FUSION N/A 02/05/2021   Procedure: Cervical Three-Four  Anterior cervical decompression/discectomy/fusion;  Surgeon: Ashok Pall, MD;  Location: [facility];  Service: Neurosurgery;  Laterality: N/A;  RM 20   APPENDECTOMY     BACK SURGERY     CERVICAL DISC SURGERY     HAND SURGERY     KNEE SURGERY      Social History   Socioeconomic History   Marital status: Widowed    Spouse name: Not on file   Number of children: Not on file   Years of education: Not on file   Highest education level: Not on file  Occupational History   Not on file  Tobacco Use   Smoking status: Never   Smokeless tobacco: Never  Vaping Use   Vaping Use: Never used  Substance and Sexual Activity   Alcohol use: Never   Drug use: Never   Sexual activity: Never  Other Topics Concern   Not on file  Social History  Narrative   Not on file   Social Determinants of Health   Financial Resource Strain: Not on file  Food Insecurity: Not on file  Transportation Needs: Not on file  Physical Activity: Not on file  Stress: Not on file  Social Connections: Not on file  Intimate Partner Violence: Not on file   Family History  Problem Relation Age of Onset   Cardiomyopathy Father    Cancer Mother       VITAL SIGNS BP (!) 101/59   Pulse 66   Temp 97.8 F (36.6 C)   Resp 20   Ht '5\' 2"'$  (1.575 m)   Wt 155 lb 9.6 oz (70.6 kg)   SpO2 97%   BMI 28.46 kg/m   Outpatient Encounter Medications as of 05/22/2021  Medication Sig   acetaminophen (TYLENOL) 325 MG tablet Take 650 mg by mouth in the morning, at noon, in the evening, and at bedtime. Max 3gm acetaminophen/24 hrs from all sources.for left shoulder pain   albuterol (VENTOLIN HFA) 108 (90 Base) MCG/ACT inhaler Inhale 1 puff into the lungs every 4 (four) hours as needed for wheezing or shortness of breath.   Amino Acids-Protein Hydrolys (FEEDING SUPPLEMENT, PRO-STAT SUGAR FREE 64,) LIQD Take 30 mLs by mouth 3 (three) times daily with meals.   Artificial Saliva (BIOTENE MOISTURIZING MOUTH MT) Use as directed in the  mouth or throat. 1 spray; mucous membrane For Dry Mouth, Three times a day   aspirin 325 MG tablet Take 325 mg by mouth daily.   atorvastatin (LIPITOR) 20 MG tablet Take 1 tablet (20 mg total) by mouth every evening.   Balsam [place]-Castor Oil (VENELEX) OINT Apply topically. Apply to sacrum, coccyx, bilateral buttocks qshift for prevention.   chlorthalidone (HYGROTON) 25 MG tablet Take 25 mg by mouth daily.   gabapentin (NEURONTIN) 300 MG capsule Take 300 mg by mouth 3 (three) times daily.   insulin glargine (LANTUS) 100 UNIT/ML injection Inject 20 Units into the skin at bedtime.   insulin lispro (HUMALOG) 100 UNIT/ML KwikPen Inject 5 Units into the skin 3 (three) times daily with meals.   melatonin 3 MG TABS tablet Take 3 mg by mouth at  bedtime.   mirtazapine (REMERON) 15 MG tablet Take 15 mg by mouth at bedtime.   NON FORMULARY Diet: NAS, Cons CHO   omeprazole (PRILOSEC) 40 MG capsule Take 40 mg by mouth daily.   rOPINIRole (REQUIP) 0.25 MG tablet Take 0.25 mg by mouth at bedtime. for spastic legs   tiZANidine (ZANAFLEX) 2 MG tablet Take 2 mg by mouth every 6 (six) hours as needed for muscle spasms (for back and sciatic pain).   No facility-administered encounter medications on file as of 05/22/2021.     SIGNIFICANT DIAGNOSTIC EXAMS   PREVIOUS   12-02-20; ct of head: No acute intracranial abnormality. Polypoid sinus disease.  12-02-20: ct of cervical spine:  Postoperative and degenerative changes in the cervical spine. No acute bony abnormality.  12-02-20; ct of pelvis:  1. No evidence of acute fracture or dislocation of the pelvis or hips. 2. 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 3. Degenerative changes in the hips with subcortical cysts on the left hip.  12-02-20: ct of left knee:  1. Subtle acute nondisplaced fracture through the medial aspect of the head of the fibula. 2. 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 3. There is a small joint effusion.  12-02-20: mri left hip:  1. No acute findings involving the left hip. 2. Mild chronic bilateral hip avascular necrosis. Moderate degenerative hip findings bilaterally. No hip effusion or regional bursitis. 3. Mild left greater than right hamstring tendinopathy. 4. 1.3 cm left eccentric Bartholin's cyst or Gartner cyst.  12-02-20: mri lumbar spine:  1. 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 2. L3-4: Bilateral posterolateral disc bulges, more prominent on the left. Mild left foraminal encroachment that could possibly affect the left L3 nerve. Moderate to severe bilateral facet osteoarthritis. These findings could relate to back pain or referred facet syndrome pain. 3. L4-5:  Bilateral facet arthropathy with 2 mm of anterolisthesis. Bulging of the disc more prominent towards the right. Mild narrowing of the lateral recesses and of the intervertebral foramen on the right, but without visible neural compression. Findings at this level could relate to back pain or referred facet syndrome pain. 4. L5-S1: Previous left hemilaminectomy. Endplate osteophytes and bulging of the disc more prominent towards the left. Facet degeneration and hypertrophy left more than right with left foraminal stenosis and subarticular lateral recess stenosis could cause left-sided neural compression.   01-25-21: ct of head; maxillofacial cervical spine:  1. No acute intracranial pathology. 2. No acute/traumatic cervical spine pathology. 3. No acute facial bone fractures.  01-26-21: ct of chest abdomen and pelvis:  1. No noncontrast CT evidence of acute traumatic injury to the chest, abdomen, or pelvis.  2. Distended urinary bladder. Mild bilateral hydronephrosis and hydroureter to the bilateral ureterovesicular junctions. No obstructing calculus or other etiology identified. Correlate for urinary retention. 3. No fracture or dislocation of the lumbar spine. Moderate disc space height loss and osteophytosis at L5-S1. Probable small broad-based posterior disc bulges at L4-L5 and L5-S1. 4. Coronary artery disease. Aortic Atherosclerosis  01-26-21: ct of right shoulder:  1.  No acute osseous injury of the right shoulder. 2. Mild mineralization in the infraspinatus tendon as can be seen with calcific tendinosis.  01-29-21: MRI of spine:  Postoperative and multilevel degenerative changes of the cervical spine. There is severe canal stenosis with cord compression at C3-C4. Abnormal cord signal is present just below this level (at operative levels) and may reflect edema or myelomalacia No significant degenerative changes of the thoracic spine. Multilevel degenerative changes of lumbar spine similar to  recent prior study.  01-30-21: renal ultrasound:  1. No acute abnormality. Small amount of right perinephric fluid, nonspecific.  02-05-21: chest x-ray:  1. Right internal jugular central venous catheter with tip over the mid SVC. No pneumothorax. 2. Hazy lung base opacities likely represent combination of atelectasis and pleural fluid. Vascular congestion.   03-06-21 chest x-ray: left lower lobe infiltrate likely representing pneumonia   03-26-21: lumbar x-ray: mild to moderate degenerative changes in lumbar spine  03-27-21; DEXA scan: t score -1.777   NO NEW EXAMS.    LABS REVIEWED PREVIOUS   12-02-20: wbc 6.4; hgb 11.3; hct 34.4; mcv 93.0 plt 216; glucose 209; bun 33; creat 1.13; k+ 4.3; na++ 137; ca 9.0; GFR 49; hgb a1c 8.6 12-03-20: wbc 6.5; hgb 9.8; hct 30.5 mcv 93.8 plt 182; glucose 188;bun 41; creat 1.49; k+ 4.4; na++ 134; ca 8.2; GFR 35 12-04-20; glucose 285; bun 29; creat 1.00; k+ 4.3; na++ 138; ca 8.7 GFR 56  01-25-21: wbc 6.4; hgb 10.6; hct 31.7; mcv 91.1 plt 227; glucose 152; bun 40; creat 1.2 k+ 4.2; na++ 135; ca 8.7; GFR 42; liver normal albumin 3.5 01-29-21: urine and blood culture: klebsiella pneumoniae 01-31-21: wbc 13.1; hgb 8.3; hct 23.6; mcv 88.1 plt 153; glucose 162; bun 67; creat 2.77; k+ 4.8; na++ 125; ca 7.5; GFR 17; ast 62; alt 57; albumin 1.7 02-04-21: wbc 9.8; hgb 7.6; hct 22.0; mcv 85.3 plt 212; glucose 161 bun 40; creat 1.40; k+ 3.4; na++ 127; ca 7.5; GFR 38; ast 62; alt 57; albumin 1.7  02-08-21: wbc 10.4; hgb 9.2; hct 27.6; mcv 87.3 plt 433; glucose 186; bun 23; creat 1.00 ;k+ 4.4; na++ 131; ca 8.1; GFR 56; ast 60; alt 61; albumin 2.1  02-20-21: hgb 9.4; hct 29.9 glucose 154; bun 51; creat 1.17; k+ 4.5; na++ 136; ca 8.9; GFR 47 02-24-21: glucose 175; bun 62; creat 1.13; k+ 4.5; na++ 133; ca 9.0 GFR 49 03-06-21: wbc 5.3; hgb 8.8; hct 27.1; mcv 93.4 plt 262; glucose 453; bun 66; creat 1.30; k+ 4.6; na++ 135; ca 9.0; GFR 41; d-dimer: 2.16; CRP 3.0   TODAY  03-13-21: d-dimer:  1.49 03-20-21: hep C nr; d-dimer 0.86 03-27-21: d-dimer 0.52 03-31-21: wbc 5.5; hgb 8.5; hct 26.8; mcv 93.4 plt 203; hgb a1c 7.6; chol 130; ldl 68; trig 138; hdl 34; urine micro-albumin 25.1  Review of Systems  Constitutional:  Negative for malaise/fatigue.  Respiratory:  Negative for cough and shortness of breath.   Cardiovascular:  Negative for chest pain, palpitations and leg swelling.  Gastrointestinal:  Negative for abdominal pain, constipation and heartburn.  Musculoskeletal:  Negative for  back pain, joint pain and myalgias.  Skin: Negative.   Neurological:  Negative for dizziness.  Psychiatric/Behavioral:  The patient is not nervous/anxious.    Physical Exam Constitutional:      General: She is not in acute distress.    Appearance: She is well-developed. She is not diaphoretic.  Neck:     Thyroid: No thyromegaly.  Cardiovascular:     Rate and Rhythm: Normal rate and regular rhythm.     Pulses: Normal pulses.     Heart sounds: Normal heart sounds.  Pulmonary:     Effort: Pulmonary effort is normal. No respiratory distress.     Breath sounds: Normal breath sounds.  Abdominal:     General: Bowel sounds are normal. There is no distension.     Palpations: Abdomen is soft.     Tenderness: There is no abdominal tenderness.  Musculoskeletal:     Cervical back: Neck supple.     Right lower leg: No edema.     Left lower leg: No edema.  Lymphadenopathy:     Cervical: No cervical adenopathy.  Skin:    General: Skin is warm and dry.  Neurological:     Mental Status: She is alert and oriented to person, place, and time.  Psychiatric:        Mood and Affect: Mood normal.     ASSESSMENT/ PLAN:  TODAY  Hyperlipidemia associated with type 2 diabetes mellitus: is stable LDL 68 will continue lipitor 20 mg daily   2. Hypertension associated with type 2 diabetes mellitus: is stable b/p 101/59 will continue hygroton 25 mg daily; is off lisinopril due to renal failure  3. GERD  without esophagitis: is stable will continue prilosec 40 mg daily   4. Chronic constipation: currently not on medications will monitor     PREVIOUS   5. Lumbar radicular syndrome/vascular necrosis bilateral hips: is without change will continue tylenol 650 mg every 6 hours and gabapentin 300 mg twice daily; is off prednisone and robaxin   6. Diabetic peripheral neuropathy: is stable will continue gabapentin 300 mg twice daily   7. CKD stage 3b due to type 2 diabetes; bun 23; creat 1.00; GFR 38 will monitor   8. Normocytic anemia: is stable hgb 9.2 will monitor   9. Septic shock due to klebsiella pneumoniae: has completed ABT will monitor her status.   10. Urine retention: is stable has foley will monitor   11. Chronic anxiety: is stable will continue remeron 15 mg nightly and has xanax 0.25 mg three times daily as needed through 02-20-21.   12. Protein calorie malnutrition severe: is without change: albumin 2.1: will continue  prostat 30 mL with meals.   13. Aortic atherosclerosis: ( ct 01-26-21) will monitor   14. Central cord syndrome at C4 level of cervical spine cord subsequent encounter: cord compression; quadriplegia C1-4 incomplete: is without change in status: will continue  asa 325 mg daily; zanaflex 4 mg every 6 hours as needed   15. Restless leg syndrome: is stable will continue requip 0.25 mg nightly   16. Closed fracture of proximal end of fibula unspecified fracture morphology sequela: is stable is presently not on narcotic pain relief.   17. Type 2 diabetes mellitus with diabetic neuropathy without long term current use of insulin: hgb a1c 7.6. is off metformin. Her insulin was stopped as well. Her cbg's become elevated during the day. Will continue humalog 5  units with lunch  Will continue  lantus 10 units nightly and  will monitor her status.      [name] NP [facility]  Contact [phone] Monday through Friday 8am- 5pm  After hours call  [phone]

## 2021-05-23 DIAGNOSIS — S14124D Central cord syndrome at C4 level of cervical spinal cord, subsequent encounter: Secondary | ICD-10-CM | POA: Diagnosis not present

## 2021-05-23 DIAGNOSIS — R279 Unspecified lack of coordination: Secondary | ICD-10-CM | POA: Diagnosis not present

## 2021-05-23 DIAGNOSIS — M6281 Muscle weakness (generalized): Secondary | ICD-10-CM | POA: Diagnosis not present

## 2021-05-25 DIAGNOSIS — R279 Unspecified lack of coordination: Secondary | ICD-10-CM | POA: Diagnosis not present

## 2021-05-25 DIAGNOSIS — M6281 Muscle weakness (generalized): Secondary | ICD-10-CM | POA: Diagnosis not present

## 2021-05-25 DIAGNOSIS — S14124D Central cord syndrome at C4 level of cervical spinal cord, subsequent encounter: Secondary | ICD-10-CM | POA: Diagnosis not present

## 2021-05-27 ENCOUNTER — Encounter: Payer: Self-pay | Admitting: Urology

## 2021-05-27 DIAGNOSIS — M6281 Muscle weakness (generalized): Secondary | ICD-10-CM | POA: Diagnosis not present

## 2021-05-27 DIAGNOSIS — M4802 Spinal stenosis, cervical region: Secondary | ICD-10-CM | POA: Diagnosis not present

## 2021-05-27 DIAGNOSIS — R279 Unspecified lack of coordination: Secondary | ICD-10-CM | POA: Diagnosis not present

## 2021-05-27 DIAGNOSIS — I10 Essential (primary) hypertension: Secondary | ICD-10-CM | POA: Diagnosis not present

## 2021-05-27 DIAGNOSIS — S14124D Central cord syndrome at C4 level of cervical spinal cord, subsequent encounter: Secondary | ICD-10-CM | POA: Diagnosis not present

## 2021-05-27 NOTE — Patient Instructions (Signed)
Acute Urinary Retention, Female Acute urinary retention is when a person cannot pee (urinate) at all, or can only pee a little. This can come on all of a sudden. If it is not treated, it can lead to kidney problems or other serious problems. What are the causes? A problem with the tube that drains the bladder (urethra). Problems with the nerves in the bladder. The organs in the area between your hip bones (pelvis) slipping out of place (prolapse). Tumors. The birth of a baby through the vagina. An infection. Having trouble pooping (constipation). Certain medicines. What increases the risk? Women over age 64 are more at risk. Other conditions also can increase risk. These include: Diseases, such as multiple sclerosis. Injury to the spinal cord. Diabetes. A condition that affects the way the brain works, such as dementia. Holding back urine due to trauma or because you do not want to use the bathroom. History of not being able to pee or peeing too little. Having had surgery in the area between your hip bones. What are the signs or symptoms? Trouble peeing. Pain in the lower belly. How is this treated? Treatment for this condition may include: Medicines. Placing a thin, germ-free tube (catheter) into the bladder to drain pee out of the body. Therapy to treat mental health conditions. Treatment for conditions that may cause this. If needed, you may be treated in the hospital for kidney problems or to manage other problems. Follow these instructions at home: Medicines Take over-the-counter and prescription medicines only as told by your doctor. Ask your doctor what medicines you should stay away from. If you were given an antibiotic medicine, take it as told by your doctor. Do not stop taking it even if you start to feel better. General instructions Do not smoke or use any products that contain nicotine or tobacco. If you need help quitting, ask your doctor. Drink enough fluid to keep  your pee pale yellow. If you were sent home with a tube that drains the bladder, take care of it as told by your doctor. Watch for changes in your symptoms. Tell your doctor about them. If told, keep track of changes in your blood pressure at home. Tell your doctor about them. Keep all follow-up visits. Contact a doctor if: You have spasms in your bladder that you cannot stop. You leak pee when you have spasms. Get help right away if: You have chills or a fever. You have blood in your pee. You have a tube that drains pee from the bladder and these things happen: The tube stops draining pee. The tube falls out. Summary Acute urinary retention is when you cannot pee at all or you pee too little. If this is not treated, it can cause kidney problems or other serious problems. If you were sent home with a tube (catheter) that drains pee from the bladder, take care of it as told by your doctor. Watch for changes in your symptoms. Tell your doctor about them. This information is not intended to replace advice given to you by your health care provider. Make sure you discuss any questions you have with your health care provider. Document Revised: 05/08/2020 Document Reviewed: 05/08/2020 Elsevier Patient Education  2022 [employer].

## 2021-05-28 ENCOUNTER — Non-Acute Institutional Stay (SKILLED_NURSING_FACILITY): Payer: Medicare HMO | Admitting: Adult Health

## 2021-05-28 ENCOUNTER — Ambulatory Visit: Payer: Medicare HMO | Admitting: Gastroenterology

## 2021-05-28 ENCOUNTER — Encounter: Payer: Self-pay | Admitting: Adult Health

## 2021-05-28 DIAGNOSIS — F339 Major depressive disorder, recurrent, unspecified: Secondary | ICD-10-CM

## 2021-05-28 DIAGNOSIS — S14124D Central cord syndrome at C4 level of cervical spinal cord, subsequent encounter: Secondary | ICD-10-CM | POA: Diagnosis not present

## 2021-05-28 DIAGNOSIS — G8252 Quadriplegia, C1-C4 incomplete: Secondary | ICD-10-CM

## 2021-05-28 DIAGNOSIS — R279 Unspecified lack of coordination: Secondary | ICD-10-CM | POA: Diagnosis not present

## 2021-05-28 DIAGNOSIS — M6281 Muscle weakness (generalized): Secondary | ICD-10-CM | POA: Diagnosis not present

## 2021-05-28 NOTE — Progress Notes (Signed)
Location:  [facility] Room Number: 138-P Place of Service:  SNF (31)   CODE STATUS: DNR  Allergies  Allergen Reactions   Codeine     Chief Complaint  Patient presents with   Acute Visit    Acute concerns     HPI:  She has been seen by neurology who has made several recommendations. She has told neurology that she is taking her tylenol "frequently". They have request that her gabapentin be stopped; and that she no longer needs her asa. She does have bilateral lower extremity numbness and tingling as well in her hands.   Past Medical History:  Diagnosis Date   DM type 2 with diabetic peripheral neuropathy (HCC)    High cholesterol    Neck pain    Neuropathy     Past Surgical History:  Procedure Laterality Date   ABDOMINAL HYSTERECTOMY     ANTERIOR CERVICAL DECOMP/DISCECTOMY FUSION N/A 02/05/2021   Procedure: Cervical Three-Four  Anterior cervical decompression/discectomy/fusion;  Surgeon: Ashok Pall, MD;  Location: [place];  Service: Neurosurgery;  Laterality: N/A;  RM 20   APPENDECTOMY     BACK SURGERY     CERVICAL DISC SURGERY     HAND SURGERY     KNEE SURGERY      Social History   Socioeconomic History   Marital status: Widowed    Spouse name: Not on file   Number of children: Not on file   Years of education: Not on file   Highest education level: Not on file  Occupational History   Not on file  Tobacco Use   Smoking status: Never   Smokeless tobacco: Never  Vaping Use   Vaping Use: Never used  Substance and Sexual Activity   Alcohol use: Never   Drug use: Never   Sexual activity: Never  Other Topics Concern   Not on file  Social History Narrative   Not on file   Social Determinants of Health   Financial Resource Strain: Not on file  Food Insecurity: Not on file  Transportation Needs: Not on file  Physical Activity: Not on file  Stress: Not on file  Social Connections: Not on file  Intimate Partner Violence: Not on  file   Family History  Problem Relation Age of Onset   Cardiomyopathy Father    Cancer Mother       VITAL SIGNS BP 130/74   Pulse 74   Temp (!) 96.8 F (36 C)   Resp 20   Ht '5\' 2"'$  (1.575 m)   Wt 155 lb 9.6 oz (70.6 kg)   SpO2 97%   BMI 28.46 kg/m   Outpatient Encounter Medications as of 05/28/2021  Medication Sig   acetaminophen (TYLENOL) 325 MG tablet Take 650 mg by mouth in the morning, at noon, in the evening, and at bedtime. Max 3gm acetaminophen/24 hrs from all sources.for left shoulder pain   albuterol (VENTOLIN HFA) 108 (90 Base) MCG/ACT inhaler Inhale 1 puff into the lungs every 4 (four) hours as needed for wheezing or shortness of breath.   Amino Acids-Protein Hydrolys (FEEDING SUPPLEMENT, PRO-STAT SUGAR FREE 64,) LIQD Take 30 mLs by mouth 3 (three) times daily with meals.   Artificial Saliva (BIOTENE MOISTURIZING MOUTH MT) Use as directed 1 application in the mouth or throat at bedtime. 9 pm   aspirin 325 MG tablet Take 325 mg by mouth daily.   atorvastatin (LIPITOR) 20 MG tablet Take 1 tablet (20 mg total) by mouth every  evening.   Balsam [place]-Castor Oil (VENELEX) OINT Apply topically. Apply to sacrum, coccyx, bilateral buttocks qshift for prevention.   chlorthalidone (HYGROTON) 25 MG tablet Take 25 mg by mouth daily.   gabapentin (NEURONTIN) 300 MG capsule Take 300 mg by mouth 3 (three) times daily.   insulin glargine (LANTUS) 100 UNIT/ML injection Inject 20 Units into the skin at bedtime.   insulin lispro (HUMALOG) 100 UNIT/ML KwikPen Inject 5 Units into the skin 3 (three) times daily with meals.   melatonin 3 MG TABS tablet Take 3 mg by mouth at bedtime.   mirtazapine (REMERON) 15 MG tablet Take 15 mg by mouth at bedtime.   NON FORMULARY Diet: NAS, Cons CHO   omeprazole (PRILOSEC) 40 MG capsule Take 40 mg by mouth daily.   rOPINIRole (REQUIP) 1 MG tablet Take 1 mg by mouth at bedtime.   tiZANidine (ZANAFLEX) 2 MG tablet Take 2 mg by mouth every 6 (six) hours as  needed for muscle spasms (for back and sciatic pain).   [DISCONTINUED] rOPINIRole (REQUIP) 0.25 MG tablet Take 0.25 mg by mouth at bedtime. for spastic legs   No facility-administered encounter medications on file as of 05/28/2021.     SIGNIFICANT DIAGNOSTIC EXAMS  PREVIOUS   12-02-20; ct of head: No acute intracranial abnormality. Polypoid sinus disease.  12-02-20: ct of cervical spine:  Postoperative and degenerative changes in the cervical spine. No acute bony abnormality.  12-02-20; ct of pelvis:  1. No evidence of acute fracture or dislocation of the pelvis or hips. 2. 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 3. Degenerative changes in the hips with subcortical cysts on the left hip.  12-02-20: ct of left knee:  1. Subtle acute nondisplaced fracture through the medial aspect of the head of the fibula. 2. 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 3. There is a small joint effusion.  12-02-20: mri left hip:  1. No acute findings involving the left hip. 2. Mild chronic bilateral hip avascular necrosis. Moderate degenerative hip findings bilaterally. No hip effusion or regional bursitis. 3. Mild left greater than right hamstring tendinopathy. 4. 1.3 cm left eccentric Bartholin's cyst or Gartner cyst.  12-02-20: mri lumbar spine:  1. 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 2. L3-4: Bilateral posterolateral disc bulges, more prominent on the left. Mild left foraminal encroachment that could possibly affect the left L3 nerve. Moderate to severe bilateral facet osteoarthritis. These findings could relate to back pain or referred facet syndrome pain. 3. L4-5: Bilateral facet arthropathy with 2 mm of anterolisthesis. Bulging of the disc more prominent towards the right. Mild narrowing of the lateral recesses and of the intervertebral foramen on the right, but without visible neural compression. Findings at this  level could relate to back pain or referred facet syndrome pain. 4. L5-S1: Previous left hemilaminectomy. Endplate osteophytes and bulging of the disc more prominent towards the left. Facet degeneration and hypertrophy left more than right with left foraminal stenosis and subarticular lateral recess stenosis could cause left-sided neural compression.   01-25-21: ct of head; maxillofacial cervical spine:  1. No acute intracranial pathology. 2. No acute/traumatic cervical spine pathology. 3. No acute facial bone fractures.  01-26-21: ct of chest abdomen and pelvis:  1. No noncontrast CT evidence of acute traumatic injury to the chest, abdomen, or pelvis. 2. Distended urinary bladder. Mild bilateral hydronephrosis and hydroureter to the bilateral ureterovesicular junctions. No obstructing calculus or other etiology identified. Correlate for urinary retention. 3. No fracture  or dislocation of the lumbar spine. Moderate disc space height loss and osteophytosis at L5-S1. Probable small broad-based posterior disc bulges at L4-L5 and L5-S1. 4. Coronary artery disease. Aortic Atherosclerosis  01-26-21: ct of right shoulder:  1.  No acute osseous injury of the right shoulder. 2. Mild mineralization in the infraspinatus tendon as can be seen with calcific tendinosis.  01-29-21: MRI of spine:  Postoperative and multilevel degenerative changes of the cervical spine. There is severe canal stenosis with cord compression at C3-C4. Abnormal cord signal is present just below this level (at operative levels) and may reflect edema or myelomalacia No significant degenerative changes of the thoracic spine. Multilevel degenerative changes of lumbar spine similar to recent prior study.  01-30-21: renal ultrasound:  1. No acute abnormality. Small amount of right perinephric fluid, nonspecific.  02-05-21: chest x-ray:  1. Right internal jugular central venous catheter with tip over the mid SVC. No pneumothorax. 2. Hazy  lung base opacities likely represent combination of atelectasis and pleural fluid. Vascular congestion.   03-06-21 chest x-ray: left lower lobe infiltrate likely representing pneumonia   03-26-21: lumbar x-ray: mild to moderate degenerative changes in lumbar spine  03-27-21; DEXA scan: t score -1.777   NO NEW EXAMS.    LABS REVIEWED PREVIOUS   12-02-20: wbc 6.4; hgb 11.3; hct 34.4; mcv 93.0 plt 216; glucose 209; bun 33; creat 1.13; k+ 4.3; na++ 137; ca 9.0; GFR 49; hgb a1c 8.6 12-03-20: wbc 6.5; hgb 9.8; hct 30.5 mcv 93.8 plt 182; glucose 188;bun 41; creat 1.49; k+ 4.4; na++ 134; ca 8.2; GFR 35 12-04-20; glucose 285; bun 29; creat 1.00; k+ 4.3; na++ 138; ca 8.7 GFR 56  01-25-21: wbc 6.4; hgb 10.6; hct 31.7; mcv 91.1 plt 227; glucose 152; bun 40; creat 1.2 k+ 4.2; na++ 135; ca 8.7; GFR 42; liver normal albumin 3.5 01-29-21: urine and blood culture: klebsiella pneumoniae 01-31-21: wbc 13.1; hgb 8.3; hct 23.6; mcv 88.1 plt 153; glucose 162; bun 67; creat 2.77; k+ 4.8; na++ 125; ca 7.5; GFR 17; ast 62; alt 57; albumin 1.7 02-04-21: wbc 9.8; hgb 7.6; hct 22.0; mcv 85.3 plt 212; glucose 161 bun 40; creat 1.40; k+ 3.4; na++ 127; ca 7.5; GFR 38; ast 62; alt 57; albumin 1.7  02-08-21: wbc 10.4; hgb 9.2; hct 27.6; mcv 87.3 plt 433; glucose 186; bun 23; creat 1.00 ;k+ 4.4; na++ 131; ca 8.1; GFR 56; ast 60; alt 61; albumin 2.1  02-20-21: hgb 9.4; hct 29.9 glucose 154; bun 51; creat 1.17; k+ 4.5; na++ 136; ca 8.9; GFR 47 02-24-21: glucose 175; bun 62; creat 1.13; k+ 4.5; na++ 133; ca 9.0 GFR 49 03-06-21: wbc 5.3; hgb 8.8; hct 27.1; mcv 93.4 plt 262; glucose 453; bun 66; creat 1.30; k+ 4.6; na++ 135; ca 9.0; GFR 41; d-dimer: 2.16; CRP 3.0  03-13-21: d-dimer: 1.49 03-20-21: hep C nr; d-dimer 0.86 03-27-21: d-dimer 0.52 03-31-21: wbc 5.5; hgb 8.5; hct 26.8; mcv 93.4 plt 203; hgb a1c 7.6; chol 130; ldl 68; trig 138; hdl 34; urine micro-albumin 25.1  NO NEW LABS.   Review of Systems  Constitutional:  Negative for  malaise/fatigue.  Respiratory:  Negative for cough and shortness of breath.   Cardiovascular:  Negative for chest pain, palpitations and leg swelling.  Gastrointestinal:  Negative for abdominal pain, constipation and heartburn.  Musculoskeletal:  Negative for back pain, joint pain and myalgias.  Skin: Negative.   Neurological:  Positive for tingling. Negative for dizziness.  Psychiatric/Behavioral:  The patient is not  nervous/anxious.     Physical Exam Constitutional:      General: She is not in acute distress.    Appearance: She is well-developed. She is not diaphoretic.  Neck:     Thyroid: No thyromegaly.  Cardiovascular:     Rate and Rhythm: Normal rate and regular rhythm.     Pulses: Normal pulses.     Heart sounds: Normal heart sounds.  Pulmonary:     Effort: Pulmonary effort is normal. No respiratory distress.     Breath sounds: Normal breath sounds.  Abdominal:     General: Bowel sounds are normal. There is no distension.     Palpations: Abdomen is soft.     Tenderness: There is no abdominal tenderness.  Musculoskeletal:     Cervical back: Neck supple.     Right lower leg: No edema.     Left lower leg: No edema.     Comments: Is able to move all extremities   Lymphadenopathy:     Cervical: No cervical adenopathy.  Skin:    General: Skin is warm and dry.  Neurological:     Mental Status: She is alert and oriented to person, place, and time.  Psychiatric:        Mood and Affect: Mood normal.     ASSESSMENT/ PLAN:  TODAY  Quadriplegia C1-C4 incomplete Major depression chronic recurrent/chronic anxiety:   Will keep remeron 15 mg nightly  Will lower tylenol to every 8 hours Will lower asa to 81 mg daily  Will lower gabapentin to 300 mg twice daily  Will monitor her status.     [name] NP [facility]  Contact [phone] Monday through Friday 8am- 5pm  After hours call [phone]

## 2021-05-29 ENCOUNTER — Encounter: Payer: Self-pay | Admitting: Orthopedic Surgery

## 2021-05-29 ENCOUNTER — Inpatient Hospital Stay: Payer: Medicare HMO

## 2021-05-29 ENCOUNTER — Ambulatory Visit (INDEPENDENT_AMBULATORY_CARE_PROVIDER_SITE_OTHER): Payer: Medicare HMO | Admitting: Orthopedic Surgery

## 2021-05-29 VITALS — BP 138/59 | HR 68 | Ht 62.0 in | Wt 155.0 lb

## 2021-05-29 DIAGNOSIS — M7552 Bursitis of left shoulder: Secondary | ICD-10-CM

## 2021-05-29 DIAGNOSIS — M7502 Adhesive capsulitis of left shoulder: Secondary | ICD-10-CM

## 2021-05-29 DIAGNOSIS — M25512 Pain in left shoulder: Secondary | ICD-10-CM | POA: Diagnosis not present

## 2021-05-29 DIAGNOSIS — M6281 Muscle weakness (generalized): Secondary | ICD-10-CM | POA: Diagnosis not present

## 2021-05-29 DIAGNOSIS — R279 Unspecified lack of coordination: Secondary | ICD-10-CM | POA: Diagnosis not present

## 2021-05-29 DIAGNOSIS — G8929 Other chronic pain: Secondary | ICD-10-CM

## 2021-05-29 DIAGNOSIS — S14124D Central cord syndrome at C4 level of cervical spinal cord, subsequent encounter: Secondary | ICD-10-CM | POA: Diagnosis not present

## 2021-05-29 NOTE — Progress Notes (Signed)
Chief Complaint  Patient presents with   Shoulder Pain    Left/ painful ROM For about 3 weeks can not do therapy due to pain    85 year old female resides at [facility] h/o Quadriplegia central cord syndrome presents with acute left shoulder pain for 3 weeks without any history of trauma.  She was able to do physical therapy yesterday secondary to the amount of pain she has been having.  She does have some pain radiate up towards the neck but most of the pain is along the anterior joint line and lateral deltoid   BP (!) 138/59   Pulse 68   Ht '5\' 2"'$  (1.575 m)   Wt 155 lb (70.3 kg)   BMI 28.35 kg/m   Her exam shows that she has tender over the anterior joint line and lateral deltoid and up into the trap she has painful range of motion and limited range of motion in all planes  Neurovascular exam is intact  The patient was awake and alert she was oriented x3 her mood was pleasant her affect was normal.  Pulse and perfusion were also normal and there was no lymphadenopathy in the upper extremity  Imaging of the shoulder showed osteopenia without fracture dislocation or calcific tendinitis  Expect acute bursitis versus adhesive capsulitis  A steroid injection was performed at left subacromial space  using 1% plain Lidocaine and 6 mg of Celestone. This was well tolerated.  Encounter Diagnoses  Name Primary?   Acute pain of left shoulder Yes   Acute bursitis of left shoulder    Adhesive capsulitis of left shoulder      Recommend subacromial injection 3 weeks of rest in a sling After 3 weeks start passive range of motion Use Tylenol 500 mg every 6 hours as needed for pain Ibuprofen 600 mg every 6 can also be used as well.

## 2021-05-31 DIAGNOSIS — M25511 Pain in right shoulder: Secondary | ICD-10-CM | POA: Diagnosis not present

## 2021-05-31 DIAGNOSIS — R498 Other voice and resonance disorders: Secondary | ICD-10-CM | POA: Diagnosis not present

## 2021-05-31 DIAGNOSIS — M6281 Muscle weakness (generalized): Secondary | ICD-10-CM | POA: Diagnosis not present

## 2021-05-31 DIAGNOSIS — S14124D Central cord syndrome at C4 level of cervical spinal cord, subsequent encounter: Secondary | ICD-10-CM | POA: Diagnosis not present

## 2021-05-31 DIAGNOSIS — R279 Unspecified lack of coordination: Secondary | ICD-10-CM | POA: Diagnosis not present

## 2021-05-31 DIAGNOSIS — M25512 Pain in left shoulder: Secondary | ICD-10-CM | POA: Diagnosis not present

## 2021-05-31 DIAGNOSIS — R1310 Dysphagia, unspecified: Secondary | ICD-10-CM | POA: Diagnosis not present

## 2021-06-02 DIAGNOSIS — M25511 Pain in right shoulder: Secondary | ICD-10-CM | POA: Diagnosis not present

## 2021-06-02 DIAGNOSIS — F339 Major depressive disorder, recurrent, unspecified: Secondary | ICD-10-CM | POA: Insufficient documentation

## 2021-06-02 DIAGNOSIS — M25512 Pain in left shoulder: Secondary | ICD-10-CM | POA: Diagnosis not present

## 2021-06-02 DIAGNOSIS — S14124D Central cord syndrome at C4 level of cervical spinal cord, subsequent encounter: Secondary | ICD-10-CM | POA: Diagnosis not present

## 2021-06-02 DIAGNOSIS — R1310 Dysphagia, unspecified: Secondary | ICD-10-CM | POA: Diagnosis not present

## 2021-06-02 DIAGNOSIS — R498 Other voice and resonance disorders: Secondary | ICD-10-CM | POA: Diagnosis not present

## 2021-06-02 DIAGNOSIS — M6281 Muscle weakness (generalized): Secondary | ICD-10-CM | POA: Diagnosis not present

## 2021-06-02 DIAGNOSIS — R279 Unspecified lack of coordination: Secondary | ICD-10-CM | POA: Diagnosis not present

## 2021-06-03 DIAGNOSIS — M25512 Pain in left shoulder: Secondary | ICD-10-CM | POA: Diagnosis not present

## 2021-06-03 DIAGNOSIS — S14124D Central cord syndrome at C4 level of cervical spinal cord, subsequent encounter: Secondary | ICD-10-CM | POA: Diagnosis not present

## 2021-06-03 DIAGNOSIS — R279 Unspecified lack of coordination: Secondary | ICD-10-CM | POA: Diagnosis not present

## 2021-06-03 DIAGNOSIS — R1310 Dysphagia, unspecified: Secondary | ICD-10-CM | POA: Diagnosis not present

## 2021-06-03 DIAGNOSIS — M6281 Muscle weakness (generalized): Secondary | ICD-10-CM | POA: Diagnosis not present

## 2021-06-03 DIAGNOSIS — M25511 Pain in right shoulder: Secondary | ICD-10-CM | POA: Diagnosis not present

## 2021-06-03 DIAGNOSIS — R498 Other voice and resonance disorders: Secondary | ICD-10-CM | POA: Diagnosis not present

## 2021-06-04 DIAGNOSIS — R498 Other voice and resonance disorders: Secondary | ICD-10-CM | POA: Diagnosis not present

## 2021-06-04 DIAGNOSIS — M6281 Muscle weakness (generalized): Secondary | ICD-10-CM | POA: Diagnosis not present

## 2021-06-04 DIAGNOSIS — R1310 Dysphagia, unspecified: Secondary | ICD-10-CM | POA: Diagnosis not present

## 2021-06-04 DIAGNOSIS — Z23 Encounter for immunization: Secondary | ICD-10-CM | POA: Diagnosis not present

## 2021-06-04 DIAGNOSIS — M25512 Pain in left shoulder: Secondary | ICD-10-CM | POA: Diagnosis not present

## 2021-06-04 DIAGNOSIS — S14124D Central cord syndrome at C4 level of cervical spinal cord, subsequent encounter: Secondary | ICD-10-CM | POA: Diagnosis not present

## 2021-06-04 DIAGNOSIS — R279 Unspecified lack of coordination: Secondary | ICD-10-CM | POA: Diagnosis not present

## 2021-06-04 DIAGNOSIS — M25511 Pain in right shoulder: Secondary | ICD-10-CM | POA: Diagnosis not present

## 2021-06-05 DIAGNOSIS — S14124D Central cord syndrome at C4 level of cervical spinal cord, subsequent encounter: Secondary | ICD-10-CM | POA: Diagnosis not present

## 2021-06-05 DIAGNOSIS — M25511 Pain in right shoulder: Secondary | ICD-10-CM | POA: Diagnosis not present

## 2021-06-05 DIAGNOSIS — R1310 Dysphagia, unspecified: Secondary | ICD-10-CM | POA: Diagnosis not present

## 2021-06-05 DIAGNOSIS — M25512 Pain in left shoulder: Secondary | ICD-10-CM | POA: Diagnosis not present

## 2021-06-05 DIAGNOSIS — R498 Other voice and resonance disorders: Secondary | ICD-10-CM | POA: Diagnosis not present

## 2021-06-05 DIAGNOSIS — M6281 Muscle weakness (generalized): Secondary | ICD-10-CM | POA: Diagnosis not present

## 2021-06-05 DIAGNOSIS — R279 Unspecified lack of coordination: Secondary | ICD-10-CM | POA: Diagnosis not present

## 2021-06-06 DIAGNOSIS — S14124D Central cord syndrome at C4 level of cervical spinal cord, subsequent encounter: Secondary | ICD-10-CM | POA: Diagnosis not present

## 2021-06-06 DIAGNOSIS — M6281 Muscle weakness (generalized): Secondary | ICD-10-CM | POA: Diagnosis not present

## 2021-06-06 DIAGNOSIS — R1310 Dysphagia, unspecified: Secondary | ICD-10-CM | POA: Diagnosis not present

## 2021-06-06 DIAGNOSIS — M25511 Pain in right shoulder: Secondary | ICD-10-CM | POA: Diagnosis not present

## 2021-06-06 DIAGNOSIS — R498 Other voice and resonance disorders: Secondary | ICD-10-CM | POA: Diagnosis not present

## 2021-06-06 DIAGNOSIS — R279 Unspecified lack of coordination: Secondary | ICD-10-CM | POA: Diagnosis not present

## 2021-06-06 DIAGNOSIS — M25512 Pain in left shoulder: Secondary | ICD-10-CM | POA: Diagnosis not present

## 2021-06-08 DIAGNOSIS — R1310 Dysphagia, unspecified: Secondary | ICD-10-CM | POA: Diagnosis not present

## 2021-06-08 DIAGNOSIS — R498 Other voice and resonance disorders: Secondary | ICD-10-CM | POA: Diagnosis not present

## 2021-06-08 DIAGNOSIS — M6281 Muscle weakness (generalized): Secondary | ICD-10-CM | POA: Diagnosis not present

## 2021-06-08 DIAGNOSIS — R279 Unspecified lack of coordination: Secondary | ICD-10-CM | POA: Diagnosis not present

## 2021-06-08 DIAGNOSIS — M25512 Pain in left shoulder: Secondary | ICD-10-CM | POA: Diagnosis not present

## 2021-06-08 DIAGNOSIS — S14124D Central cord syndrome at C4 level of cervical spinal cord, subsequent encounter: Secondary | ICD-10-CM | POA: Diagnosis not present

## 2021-06-08 DIAGNOSIS — M25511 Pain in right shoulder: Secondary | ICD-10-CM | POA: Diagnosis not present

## 2021-06-09 ENCOUNTER — Non-Acute Institutional Stay (SKILLED_NURSING_FACILITY): Payer: Medicare HMO | Admitting: Adult Health

## 2021-06-09 ENCOUNTER — Encounter: Payer: Self-pay | Admitting: Adult Health

## 2021-06-09 DIAGNOSIS — M6281 Muscle weakness (generalized): Secondary | ICD-10-CM | POA: Diagnosis not present

## 2021-06-09 DIAGNOSIS — G8252 Quadriplegia, C1-C4 incomplete: Secondary | ICD-10-CM

## 2021-06-09 DIAGNOSIS — R498 Other voice and resonance disorders: Secondary | ICD-10-CM | POA: Diagnosis not present

## 2021-06-09 DIAGNOSIS — R1310 Dysphagia, unspecified: Secondary | ICD-10-CM | POA: Diagnosis not present

## 2021-06-09 DIAGNOSIS — M25512 Pain in left shoulder: Secondary | ICD-10-CM | POA: Diagnosis not present

## 2021-06-09 DIAGNOSIS — F339 Major depressive disorder, recurrent, unspecified: Secondary | ICD-10-CM

## 2021-06-09 DIAGNOSIS — S14124D Central cord syndrome at C4 level of cervical spinal cord, subsequent encounter: Secondary | ICD-10-CM | POA: Diagnosis not present

## 2021-06-09 DIAGNOSIS — M25511 Pain in right shoulder: Secondary | ICD-10-CM | POA: Diagnosis not present

## 2021-06-09 DIAGNOSIS — R279 Unspecified lack of coordination: Secondary | ICD-10-CM | POA: Diagnosis not present

## 2021-06-09 NOTE — Progress Notes (Signed)
Location:  [facility] Room Number: 154-W Place of Service:  SNF (31)   CODE STATUS: DNR  Allergies  Allergen Reactions   Codeine     Chief Complaint  Patient presents with   Acute Visit    Family concerns     HPI:  I have discussed her medication regimen with her family. The neurologist has requested that the gabapentin to be stopped. She is presently taking gabapentin twice daily. Will finish the wean off this medication. She is presently taking remeron 15 mg nightly for her depression. She scored 9/30 on her last depression assessment in July.   Past Medical History:  Diagnosis Date   DM type 2 with diabetic peripheral neuropathy (HCC)    High cholesterol    Neck pain    Neuropathy     Past Surgical History:  Procedure Laterality Date   ABDOMINAL HYSTERECTOMY     ANTERIOR CERVICAL DECOMP/DISCECTOMY FUSION N/A 02/05/2021   Procedure: Cervical Three-Four  Anterior cervical decompression/discectomy/fusion;  Surgeon: Ashok Pall, MD;  Location: [facility];  Service: Neurosurgery;  Laterality: N/A;  RM 20   APPENDECTOMY     BACK SURGERY     CERVICAL DISC SURGERY     HAND SURGERY     KNEE SURGERY      Social History   Socioeconomic History   Marital status: Widowed    Spouse name: Not on file   Number of children: Not on file   Years of education: Not on file   Highest education level: Not on file  Occupational History   Not on file  Tobacco Use   Smoking status: Never   Smokeless tobacco: Never  Vaping Use   Vaping Use: Never used  Substance and Sexual Activity   Alcohol use: Never   Drug use: Never   Sexual activity: Never  Other Topics Concern   Not on file  Social History Narrative   Not on file   Social Determinants of Health   Financial Resource Strain: Not on file  Food Insecurity: Not on file  Transportation Needs: Not on file  Physical Activity: Not on file  Stress: Not on file  Social Connections: Not on file  Intimate  Partner Violence: Not on file   Family History  Problem Relation Age of Onset   Cardiomyopathy Father    Cancer Mother       VITAL SIGNS BP (!) 122/49   Pulse 63   Temp (!) 97.2 F (36.2 C)   Resp 20   Ht '5\' 2"'$  (1.575 m)   Wt 159 lb 8 oz (72.3 kg)   SpO2 97%   BMI 29.17 kg/m   Outpatient Encounter Medications as of 06/09/2021  Medication Sig   acetaminophen (TYLENOL) 325 MG tablet Take 650 mg by mouth in the morning, at noon, in the evening, and at bedtime. Max 3gm acetaminophen/24 hrs from all sources.for left shoulder pain   albuterol (VENTOLIN HFA) 108 (90 Base) MCG/ACT inhaler Inhale 1 puff into the lungs every 4 (four) hours as needed for wheezing or shortness of breath.   Amino Acids-Protein Hydrolys (FEEDING SUPPLEMENT, PRO-STAT SUGAR FREE 64,) LIQD Take 30 mLs by mouth 3 (three) times daily with meals.   Artificial Saliva (BIOTENE MOISTURIZING MOUTH MT) Use as directed 1 application in the mouth or throat at bedtime. 9 pm   aspirin 325 MG tablet Take 325 mg by mouth daily.   atorvastatin (LIPITOR) 20 MG tablet Take 1 tablet (20 mg total)  by mouth every evening.   Balsam [place]-Castor Oil (VENELEX) OINT Apply topically. Apply to sacrum, coccyx, bilateral buttocks qshift for prevention.   chlorthalidone (HYGROTON) 25 MG tablet Take 25 mg by mouth daily.   gabapentin (NEURONTIN) 300 MG capsule Take 300 mg by mouth 3 (three) times daily.   insulin glargine (LANTUS) 100 UNIT/ML injection Inject 20 Units into the skin at bedtime.   insulin lispro (HUMALOG) 100 UNIT/ML KwikPen Inject 5 Units into the skin 3 (three) times daily with meals.   melatonin 3 MG TABS tablet Take 3 mg by mouth at bedtime.   mirtazapine (REMERON) 15 MG tablet Take 15 mg by mouth at bedtime.   NON FORMULARY Diet: NAS, Cons CHO   omeprazole (PRILOSEC) 40 MG capsule Take 40 mg by mouth daily.   rOPINIRole (REQUIP) 1 MG tablet Take 1 mg by mouth at bedtime.   tiZANidine (ZANAFLEX) 2 MG tablet Take 2 mg by  mouth every 6 (six) hours as needed for muscle spasms (for back and sciatic pain).   No facility-administered encounter medications on file as of 06/09/2021.     SIGNIFICANT DIAGNOSTIC EXAMS   PREVIOUS   12-02-20; ct of head: No acute intracranial abnormality. Polypoid sinus disease.  12-02-20: ct of cervical spine:  Postoperative and degenerative changes in the cervical spine. No acute bony abnormality.  12-02-20; ct of pelvis:  1. No evidence of acute fracture or dislocation of the pelvis or hips. 2. 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 3. Degenerative changes in the hips with subcortical cysts on the left hip.  12-02-20: ct of left knee:  1. Subtle acute nondisplaced fracture through the medial aspect of the head of the fibula. 2. 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 3. There is a small joint effusion.  12-02-20: mri left hip:  1. No acute findings involving the left hip. 2. Mild chronic bilateral hip avascular necrosis. Moderate degenerative hip findings bilaterally. No hip effusion or regional bursitis. 3. Mild left greater than right hamstring tendinopathy. 4. 1.3 cm left eccentric Bartholin's cyst or Gartner cyst.  12-02-20: mri lumbar spine:  1. 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 2. L3-4: Bilateral posterolateral disc bulges, more prominent on the left. Mild left foraminal encroachment that could possibly affect the left L3 nerve. Moderate to severe bilateral facet osteoarthritis. These findings could relate to back pain or referred facet syndrome pain. 3. L4-5: Bilateral facet arthropathy with 2 mm of anterolisthesis. Bulging of the disc more prominent towards the right. Mild narrowing of the lateral recesses and of the intervertebral foramen on the right, but without visible neural compression. Findings at this level could relate to back pain or referred facet syndrome pain. 4.  L5-S1: Previous left hemilaminectomy. Endplate osteophytes and bulging of the disc more prominent towards the left. Facet degeneration and hypertrophy left more than right with left foraminal stenosis and subarticular lateral recess stenosis could cause left-sided neural compression.   01-25-21: ct of head; maxillofacial cervical spine:  1. No acute intracranial pathology. 2. No acute/traumatic cervical spine pathology. 3. No acute facial bone fractures.  01-26-21: ct of chest abdomen and pelvis:  1. No noncontrast CT evidence of acute traumatic injury to the chest, abdomen, or pelvis. 2. Distended urinary bladder. Mild bilateral hydronephrosis and hydroureter to the bilateral ureterovesicular junctions. No obstructing calculus or other etiology identified. Correlate for urinary retention. 3. No fracture or dislocation of the lumbar spine. Moderate disc space height loss and osteophytosis at  L5-S1. Probable small broad-based posterior disc bulges at L4-L5 and L5-S1. 4. Coronary artery disease. Aortic Atherosclerosis  01-26-21: ct of right shoulder:  1.  No acute osseous injury of the right shoulder. 2. Mild mineralization in the infraspinatus tendon as can be seen with calcific tendinosis.  01-29-21: MRI of spine:  Postoperative and multilevel degenerative changes of the cervical spine. There is severe canal stenosis with cord compression at C3-C4. Abnormal cord signal is present just below this level (at operative levels) and may reflect edema or myelomalacia No significant degenerative changes of the thoracic spine. Multilevel degenerative changes of lumbar spine similar to recent prior study.  01-30-21: renal ultrasound:  1. No acute abnormality. Small amount of right perinephric fluid, nonspecific.  02-05-21: chest x-ray:  1. Right internal jugular central venous catheter with tip over the mid SVC. No pneumothorax. 2. Hazy lung base opacities likely represent combination of atelectasis and  pleural fluid. Vascular congestion.   03-06-21 chest x-ray: left lower lobe infiltrate likely representing pneumonia   03-26-21: lumbar x-ray: mild to moderate degenerative changes in lumbar spine  03-27-21; DEXA scan: t score -1.777   NO NEW EXAMS.    LABS REVIEWED PREVIOUS   12-02-20: wbc 6.4; hgb 11.3; hct 34.4; mcv 93.0 plt 216; glucose 209; bun 33; creat 1.13; k+ 4.3; na++ 137; ca 9.0; GFR 49; hgb a1c 8.6 12-03-20: wbc 6.5; hgb 9.8; hct 30.5 mcv 93.8 plt 182; glucose 188;bun 41; creat 1.49; k+ 4.4; na++ 134; ca 8.2; GFR 35 12-04-20; glucose 285; bun 29; creat 1.00; k+ 4.3; na++ 138; ca 8.7 GFR 56  01-25-21: wbc 6.4; hgb 10.6; hct 31.7; mcv 91.1 plt 227; glucose 152; bun 40; creat 1.2 k+ 4.2; na++ 135; ca 8.7; GFR 42; liver normal albumin 3.5 01-29-21: urine and blood culture: klebsiella pneumoniae 01-31-21: wbc 13.1; hgb 8.3; hct 23.6; mcv 88.1 plt 153; glucose 162; bun 67; creat 2.77; k+ 4.8; na++ 125; ca 7.5; GFR 17; ast 62; alt 57; albumin 1.7 02-04-21: wbc 9.8; hgb 7.6; hct 22.0; mcv 85.3 plt 212; glucose 161 bun 40; creat 1.40; k+ 3.4; na++ 127; ca 7.5; GFR 38; ast 62; alt 57; albumin 1.7  02-08-21: wbc 10.4; hgb 9.2; hct 27.6; mcv 87.3 plt 433; glucose 186; bun 23; creat 1.00 ;k+ 4.4; na++ 131; ca 8.1; GFR 56; ast 60; alt 61; albumin 2.1  02-20-21: hgb 9.4; hct 29.9 glucose 154; bun 51; creat 1.17; k+ 4.5; na++ 136; ca 8.9; GFR 47 02-24-21: glucose 175; bun 62; creat 1.13; k+ 4.5; na++ 133; ca 9.0 GFR 49 03-06-21: wbc 5.3; hgb 8.8; hct 27.1; mcv 93.4 plt 262; glucose 453; bun 66; creat 1.30; k+ 4.6; na++ 135; ca 9.0; GFR 41; d-dimer: 2.16; CRP 3.0  03-13-21: d-dimer: 1.49 03-20-21: hep C nr; d-dimer 0.86 03-27-21: d-dimer 0.52 03-31-21: wbc 5.5; hgb 8.5; hct 26.8; mcv 93.4 plt 203; hgb a1c 7.6; chol 130; ldl 68; trig 138; hdl 34; urine micro-albumin 25.1  NO NEW LABS.   Review of Systems  Constitutional:  Negative for malaise/fatigue.  Respiratory:  Negative for cough and shortness of breath.    Cardiovascular:  Negative for chest pain, palpitations and leg swelling.  Gastrointestinal:  Negative for abdominal pain, constipation and heartburn.  Musculoskeletal:  Negative for back pain, joint pain and myalgias.  Skin: Negative.   Neurological:  Negative for dizziness.  Psychiatric/Behavioral:  Positive for depression. The patient is not nervous/anxious.    Physical Exam Constitutional:      General: She  is not in acute distress.    Appearance: She is well-developed. She is not diaphoretic.  Neck:     Thyroid: No thyromegaly.  Cardiovascular:     Rate and Rhythm: Normal rate and regular rhythm.     Pulses: Normal pulses.     Heart sounds: Normal heart sounds.  Pulmonary:     Effort: Pulmonary effort is normal. No respiratory distress.     Breath sounds: Normal breath sounds.  Abdominal:     General: Bowel sounds are normal. There is no distension.     Palpations: Abdomen is soft.     Tenderness: There is no abdominal tenderness.  Musculoskeletal:     Cervical back: Neck supple.     Right lower leg: No edema.     Left lower leg: No edema.     Comments: : Is able to move all extremities    Lymphadenopathy:     Cervical: No cervical adenopathy.  Skin:    General: Skin is warm and dry.  Neurological:     Mental Status: She is alert. Mental status is at baseline.  Psychiatric:        Mood and Affect: Mood normal.      ASSESSMENT/ PLAN:  TODAY  Major depression chronic recurrent:   Quadriplegia C1-C4 incomplete  Will lower gabapentin to HS through 06-16-21: then d/c.  Will not use NSAIDS due to her chronic renal failure stage 3.    [name] NP [facility]  Contact [phone] Monday through Friday 8am- 5pm  After hours call [phone]

## 2021-06-10 DIAGNOSIS — M25511 Pain in right shoulder: Secondary | ICD-10-CM | POA: Diagnosis not present

## 2021-06-10 DIAGNOSIS — M25512 Pain in left shoulder: Secondary | ICD-10-CM | POA: Diagnosis not present

## 2021-06-10 DIAGNOSIS — S14124D Central cord syndrome at C4 level of cervical spinal cord, subsequent encounter: Secondary | ICD-10-CM | POA: Diagnosis not present

## 2021-06-10 DIAGNOSIS — R1310 Dysphagia, unspecified: Secondary | ICD-10-CM | POA: Diagnosis not present

## 2021-06-10 DIAGNOSIS — R498 Other voice and resonance disorders: Secondary | ICD-10-CM | POA: Diagnosis not present

## 2021-06-10 DIAGNOSIS — R279 Unspecified lack of coordination: Secondary | ICD-10-CM | POA: Diagnosis not present

## 2021-06-10 DIAGNOSIS — M6281 Muscle weakness (generalized): Secondary | ICD-10-CM | POA: Diagnosis not present

## 2021-06-11 ENCOUNTER — Encounter: Payer: Self-pay | Admitting: Adult Health

## 2021-06-11 ENCOUNTER — Non-Acute Institutional Stay (SKILLED_NURSING_FACILITY): Payer: Medicare HMO | Admitting: Adult Health

## 2021-06-11 DIAGNOSIS — M25512 Pain in left shoulder: Secondary | ICD-10-CM | POA: Diagnosis not present

## 2021-06-11 DIAGNOSIS — R1310 Dysphagia, unspecified: Secondary | ICD-10-CM | POA: Diagnosis not present

## 2021-06-11 DIAGNOSIS — E1122 Type 2 diabetes mellitus with diabetic chronic kidney disease: Secondary | ICD-10-CM | POA: Diagnosis not present

## 2021-06-11 DIAGNOSIS — S14124D Central cord syndrome at C4 level of cervical spinal cord, subsequent encounter: Secondary | ICD-10-CM | POA: Diagnosis not present

## 2021-06-11 DIAGNOSIS — Z794 Long term (current) use of insulin: Secondary | ICD-10-CM | POA: Diagnosis not present

## 2021-06-11 DIAGNOSIS — N1831 Chronic kidney disease, stage 3a: Secondary | ICD-10-CM

## 2021-06-11 DIAGNOSIS — R279 Unspecified lack of coordination: Secondary | ICD-10-CM | POA: Diagnosis not present

## 2021-06-11 DIAGNOSIS — R498 Other voice and resonance disorders: Secondary | ICD-10-CM | POA: Diagnosis not present

## 2021-06-11 DIAGNOSIS — M25511 Pain in right shoulder: Secondary | ICD-10-CM | POA: Diagnosis not present

## 2021-06-11 DIAGNOSIS — M6281 Muscle weakness (generalized): Secondary | ICD-10-CM | POA: Diagnosis not present

## 2021-06-11 NOTE — Progress Notes (Signed)
Location:  [facility] Room Number: 154-W Place of Service:  SNF (31)   CODE STATUS: DNR  Allergies  Allergen Reactions   Codeine     Chief Complaint  Patient presents with   Acute Visit    Diabetes management     HPI:  She is presently taking lantus 20 units nightly humalog 5 units with meals for cbg >150. 8A: 110-171; 12P: 145-323; 6P: [id number]. She is taking the humalog nearly all meals. There are no reports of missed doses. There are no reports of excessive thirst or hunger.   Past Medical History:  Diagnosis Date   DM type 2 with diabetic peripheral neuropathy (HCC)    High cholesterol    Neck pain    Neuropathy     Past Surgical History:  Procedure Laterality Date   ABDOMINAL HYSTERECTOMY     ANTERIOR CERVICAL DECOMP/DISCECTOMY FUSION N/A 02/05/2021   Procedure: Cervical Three-Four  Anterior cervical decompression/discectomy/fusion;  Surgeon: Ashok Pall, MD;  Location: [facility];  Service: Neurosurgery;  Laterality: N/A;  RM 20   APPENDECTOMY     BACK SURGERY     CERVICAL DISC SURGERY     HAND SURGERY     KNEE SURGERY      Social History   Socioeconomic History   Marital status: Widowed    Spouse name: Not on file   Number of children: Not on file   Years of education: Not on file   Highest education level: Not on file  Occupational History   Not on file  Tobacco Use   Smoking status: Never   Smokeless tobacco: Never  Vaping Use   Vaping Use: Never used  Substance and Sexual Activity   Alcohol use: Never   Drug use: Never   Sexual activity: Never  Other Topics Concern   Not on file  Social History Narrative   Not on file   Social Determinants of Health   Financial Resource Strain: Not on file  Food Insecurity: Not on file  Transportation Needs: Not on file  Physical Activity: Not on file  Stress: Not on file  Social Connections: Not on file  Intimate Partner Violence: Not on file   Family History  Problem Relation  Age of Onset   Cardiomyopathy Father    Cancer Mother       VITAL SIGNS BP (!) 122/49   Pulse 63   Temp 98.6 F (37 C)   Ht '5\' 2"'$  (1.575 m)   Wt 159 lb 8 oz (72.3 kg)   BMI 29.17 kg/m   Outpatient Encounter Medications as of 06/11/2021  Medication Sig   acetaminophen (TYLENOL) 325 MG tablet Take 650 mg by mouth every 8 (eight) hours.   albuterol (VENTOLIN HFA) 108 (90 Base) MCG/ACT inhaler Inhale 1 puff into the lungs every 4 (four) hours as needed for wheezing or shortness of breath.   Amino Acids-Protein Hydrolys (FEEDING SUPPLEMENT, PRO-STAT SUGAR FREE 64,) LIQD Take 30 mLs by mouth 3 (three) times daily with meals.   Artificial Saliva (BIOTENE MOISTURIZING MOUTH MT) Use as directed 1 application in the mouth or throat at bedtime. 9 pm   aspirin EC 81 MG tablet Take 81 mg by mouth daily. Swallow whole.   atorvastatin (LIPITOR) 20 MG tablet Take 1 tablet (20 mg total) by mouth every evening.   Balsam [place]-Castor Oil (VENELEX) OINT Apply topically. Apply to sacrum, coccyx, bilateral buttocks qshift for prevention.   chlorthalidone (HYGROTON) 25 MG tablet Take 25  mg by mouth daily.   gabapentin (NEURONTIN) 300 MG capsule Take 300 mg by mouth at bedtime.   insulin glargine (LANTUS) 100 UNIT/ML injection Inject 20 Units into the skin at bedtime.   insulin lispro (HUMALOG) 100 UNIT/ML KwikPen Inject 5 Units into the skin 2 (two) times daily with a meal.   melatonin 3 MG TABS tablet Take 3 mg by mouth at bedtime.   mirtazapine (REMERON) 15 MG tablet Take 15 mg by mouth at bedtime.   NON FORMULARY Diet: NAS, Cons CHO   omeprazole (PRILOSEC) 40 MG capsule Take 40 mg by mouth daily.   rOPINIRole (REQUIP) 1 MG tablet Take 1 mg by mouth at bedtime.   tiZANidine (ZANAFLEX) 2 MG tablet Take 2 mg by mouth every 6 (six) hours as needed for muscle spasms (for back and sciatic pain).   [DISCONTINUED] aspirin 325 MG tablet Take 325 mg by mouth daily.   No facility-administered encounter  medications on file as of 06/11/2021.     SIGNIFICANT DIAGNOSTIC EXAMS  PREVIOUS   12-02-20; ct of head: No acute intracranial abnormality. Polypoid sinus disease.  12-02-20: ct of cervical spine:  Postoperative and degenerative changes in the cervical spine. No acute bony abnormality.  12-02-20; ct of pelvis:  1. No evidence of acute fracture or dislocation of the pelvis or hips. 2. 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 3. Degenerative changes in the hips with subcortical cysts on the left hip.  12-02-20: ct of left knee:  1. Subtle acute nondisplaced fracture through the medial aspect of the head of the fibula. 2. 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 3. There is a small joint effusion.  12-02-20: mri left hip:  1. No acute findings involving the left hip. 2. Mild chronic bilateral hip avascular necrosis. Moderate degenerative hip findings bilaterally. No hip effusion or regional bursitis. 3. Mild left greater than right hamstring tendinopathy. 4. 1.3 cm left eccentric Bartholin's cyst or Gartner cyst.  12-02-20: mri lumbar spine:  1. 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 2. L3-4: Bilateral posterolateral disc bulges, more prominent on the left. Mild left foraminal encroachment that could possibly affect the left L3 nerve. Moderate to severe bilateral facet osteoarthritis. These findings could relate to back pain or referred facet syndrome pain. 3. L4-5: Bilateral facet arthropathy with 2 mm of anterolisthesis. Bulging of the disc more prominent towards the right. Mild narrowing of the lateral recesses and of the intervertebral foramen on the right, but without visible neural compression. Findings at this level could relate to back pain or referred facet syndrome pain. 4. L5-S1: Previous left hemilaminectomy. Endplate osteophytes and bulging of the disc more prominent towards the left. Facet  degeneration and hypertrophy left more than right with left foraminal stenosis and subarticular lateral recess stenosis could cause left-sided neural compression.   01-25-21: ct of head; maxillofacial cervical spine:  1. No acute intracranial pathology. 2. No acute/traumatic cervical spine pathology. 3. No acute facial bone fractures.  01-26-21: ct of chest abdomen and pelvis:  1. No noncontrast CT evidence of acute traumatic injury to the chest, abdomen, or pelvis. 2. Distended urinary bladder. Mild bilateral hydronephrosis and hydroureter to the bilateral ureterovesicular junctions. No obstructing calculus or other etiology identified. Correlate for urinary retention. 3. No fracture or dislocation of the lumbar spine. Moderate disc space height loss and osteophytosis at L5-S1. Probable small broad-based posterior disc bulges at L4-L5 and L5-S1. 4. Coronary artery disease. Aortic Atherosclerosis  01-26-21: ct  of right shoulder:  1.  No acute osseous injury of the right shoulder. 2. Mild mineralization in the infraspinatus tendon as can be seen with calcific tendinosis.  01-29-21: MRI of spine:  Postoperative and multilevel degenerative changes of the cervical spine. There is severe canal stenosis with cord compression at C3-C4. Abnormal cord signal is present just below this level (at operative levels) and may reflect edema or myelomalacia No significant degenerative changes of the thoracic spine. Multilevel degenerative changes of lumbar spine similar to recent prior study.  01-30-21: renal ultrasound:  1. No acute abnormality. Small amount of right perinephric fluid, nonspecific.  02-05-21: chest x-ray:  1. Right internal jugular central venous catheter with tip over the mid SVC. No pneumothorax. 2. Hazy lung base opacities likely represent combination of atelectasis and pleural fluid. Vascular congestion.   03-06-21 chest x-ray: left lower lobe infiltrate likely representing pneumonia    03-26-21: lumbar x-ray: mild to moderate degenerative changes in lumbar spine  03-27-21; DEXA scan: t score -1.777   NO NEW EXAMS.    LABS REVIEWED PREVIOUS   12-02-20: wbc 6.4; hgb 11.3; hct 34.4; mcv 93.0 plt 216; glucose 209; bun 33; creat 1.13; k+ 4.3; na++ 137; ca 9.0; GFR 49; hgb a1c 8.6 12-03-20: wbc 6.5; hgb 9.8; hct 30.5 mcv 93.8 plt 182; glucose 188;bun 41; creat 1.49; k+ 4.4; na++ 134; ca 8.2; GFR 35 12-04-20; glucose 285; bun 29; creat 1.00; k+ 4.3; na++ 138; ca 8.7 GFR 56  01-25-21: wbc 6.4; hgb 10.6; hct 31.7; mcv 91.1 plt 227; glucose 152; bun 40; creat 1.2 k+ 4.2; na++ 135; ca 8.7; GFR 42; liver normal albumin 3.5 01-29-21: urine and blood culture: klebsiella pneumoniae 01-31-21: wbc 13.1; hgb 8.3; hct 23.6; mcv 88.1 plt 153; glucose 162; bun 67; creat 2.77; k+ 4.8; na++ 125; ca 7.5; GFR 17; ast 62; alt 57; albumin 1.7 02-04-21: wbc 9.8; hgb 7.6; hct 22.0; mcv 85.3 plt 212; glucose 161 bun 40; creat 1.40; k+ 3.4; na++ 127; ca 7.5; GFR 38; ast 62; alt 57; albumin 1.7  02-08-21: wbc 10.4; hgb 9.2; hct 27.6; mcv 87.3 plt 433; glucose 186; bun 23; creat 1.00 ;k+ 4.4; na++ 131; ca 8.1; GFR 56; ast 60; alt 61; albumin 2.1  02-20-21: hgb 9.4; hct 29.9 glucose 154; bun 51; creat 1.17; k+ 4.5; na++ 136; ca 8.9; GFR 47 02-24-21: glucose 175; bun 62; creat 1.13; k+ 4.5; na++ 133; ca 9.0 GFR 49 03-06-21: wbc 5.3; hgb 8.8; hct 27.1; mcv 93.4 plt 262; glucose 453; bun 66; creat 1.30; k+ 4.6; na++ 135; ca 9.0; GFR 41; d-dimer: 2.16; CRP 3.0  03-13-21: d-dimer: 1.49 03-20-21: hep C nr; d-dimer 0.86 03-27-21: d-dimer 0.52 03-31-21: wbc 5.5; hgb 8.5; hct 26.8; mcv 93.4 plt 203; hgb a1c 7.6; chol 130; ldl 68; trig 138; hdl 34; urine micro-albumin 25.1  NO NEW LABS.   Review of Systems  Constitutional:  Negative for malaise/fatigue.  Respiratory:  Negative for cough and shortness of breath.   Cardiovascular:  Negative for chest pain, palpitations and leg swelling.  Gastrointestinal:  Negative for abdominal  pain, constipation and heartburn.  Musculoskeletal:  Negative for back pain, joint pain and myalgias.  Skin: Negative.   Neurological:  Negative for dizziness.  Psychiatric/Behavioral:  The patient is not nervous/anxious.    Physical Exam Constitutional:      General: She is not in acute distress.    Appearance: She is well-developed. She is not diaphoretic.  Neck:     Thyroid:  No thyromegaly.  Cardiovascular:     Rate and Rhythm: Normal rate and regular rhythm.     Pulses: Normal pulses.     Heart sounds: Normal heart sounds.  Pulmonary:     Effort: Pulmonary effort is normal. No respiratory distress.     Breath sounds: Normal breath sounds.  Abdominal:     General: Bowel sounds are normal. There is no distension.     Palpations: Abdomen is soft.     Tenderness: There is no abdominal tenderness.  Musculoskeletal:     Cervical back: Neck supple.     Right lower leg: No edema.     Left lower leg: No edema.     Comments:  Is able to move all extremities     Lymphadenopathy:     Cervical: No cervical adenopathy.  Skin:    General: Skin is warm and dry.  Neurological:     Mental Status: She is alert. Mental status is at baseline.  Psychiatric:        Mood and Affect: Mood normal.      ASSESSMENT/ PLAN:  TODAY  Type 2 diabetes mellitus with stage 3a chronic kidney disease on long term current use of insulin:  will change humalog to 5 units with meals; no hold parameters; and will change cbg to every AM.      [name] NP [facility]  Contact [phone] Monday through Friday 8am- 5pm  After hours call [phone]

## 2021-06-12 ENCOUNTER — Encounter: Payer: Self-pay | Admitting: Adult Health

## 2021-06-12 ENCOUNTER — Non-Acute Institutional Stay (SKILLED_NURSING_FACILITY): Payer: Medicare HMO | Admitting: Adult Health

## 2021-06-12 ENCOUNTER — Other Ambulatory Visit (HOSPITAL_COMMUNITY)
Admission: RE | Admit: 2021-06-12 | Discharge: 2021-06-12 | Disposition: A | Discharge status: Home / Self Care | Payer: Medicare HMO | Source: Skilled Nursing Facility | Attending: Adult Health | Admitting: Adult Health

## 2021-06-12 DIAGNOSIS — M109 Gout, unspecified: Secondary | ICD-10-CM | POA: Insufficient documentation

## 2021-06-12 DIAGNOSIS — M25531 Pain in right wrist: Secondary | ICD-10-CM | POA: Diagnosis not present

## 2021-06-12 DIAGNOSIS — M79641 Pain in right hand: Secondary | ICD-10-CM | POA: Diagnosis not present

## 2021-06-12 DIAGNOSIS — M25511 Pain in right shoulder: Secondary | ICD-10-CM | POA: Diagnosis not present

## 2021-06-12 DIAGNOSIS — R498 Other voice and resonance disorders: Secondary | ICD-10-CM | POA: Diagnosis not present

## 2021-06-12 DIAGNOSIS — R1310 Dysphagia, unspecified: Secondary | ICD-10-CM | POA: Diagnosis not present

## 2021-06-12 DIAGNOSIS — M25631 Stiffness of right wrist, not elsewhere classified: Secondary | ICD-10-CM | POA: Diagnosis not present

## 2021-06-12 DIAGNOSIS — M25512 Pain in left shoulder: Secondary | ICD-10-CM | POA: Diagnosis not present

## 2021-06-12 DIAGNOSIS — M6281 Muscle weakness (generalized): Secondary | ICD-10-CM | POA: Diagnosis not present

## 2021-06-12 DIAGNOSIS — M25641 Stiffness of right hand, not elsewhere classified: Secondary | ICD-10-CM | POA: Diagnosis not present

## 2021-06-12 DIAGNOSIS — S14124D Central cord syndrome at C4 level of cervical spinal cord, subsequent encounter: Secondary | ICD-10-CM | POA: Diagnosis not present

## 2021-06-12 DIAGNOSIS — R279 Unspecified lack of coordination: Secondary | ICD-10-CM | POA: Diagnosis not present

## 2021-06-12 LAB — URIC ACID: Uric Acid, Serum: 5.9 mg/dL (ref 2.5–7.1)

## 2021-06-12 NOTE — Progress Notes (Signed)
Location:  [facility] Room Number: 154-W Place of Service:  SNF (31)   CODE STATUS: DNR  Allergies  Allergen Reactions   Codeine     Chief Complaint  Patient presents with   Acute Visit    Pain management of right hand     HPI:  She is having right index finger pain. The area is red warm and swollen. She does not have a history of gout. She does take a diuretic. There are no reports of fevers present.   Past Medical History:  Diagnosis Date   DM type 2 with diabetic peripheral neuropathy (HCC)    High cholesterol    Neck pain    Neuropathy     Past Surgical History:  Procedure Laterality Date   ABDOMINAL HYSTERECTOMY     ANTERIOR CERVICAL DECOMP/DISCECTOMY FUSION N/A 02/05/2021   Procedure: Cervical Three-Four  Anterior cervical decompression/discectomy/fusion;  Surgeon: Ashok Pall, MD;  Location: [facility];  Service: Neurosurgery;  Laterality: N/A;  RM 20   APPENDECTOMY     BACK SURGERY     CERVICAL DISC SURGERY     HAND SURGERY     KNEE SURGERY      Social History   Socioeconomic History   Marital status: Widowed    Spouse name: Not on file   Number of children: Not on file   Years of education: Not on file   Highest education level: Not on file  Occupational History   Not on file  Tobacco Use   Smoking status: Never   Smokeless tobacco: Never  Vaping Use   Vaping Use: Never used  Substance and Sexual Activity   Alcohol use: Never   Drug use: Never   Sexual activity: Never  Other Topics Concern   Not on file  Social History Narrative   Not on file   Social Determinants of Health   Financial Resource Strain: Not on file  Food Insecurity: Not on file  Transportation Needs: Not on file  Physical Activity: Not on file  Stress: Not on file  Social Connections: Not on file  Intimate Partner Violence: Not on file   Family History  Problem Relation Age of Onset   Cardiomyopathy Father    Cancer Mother       VITAL  SIGNS BP (!) 122/49   Pulse 63   Temp 98.6 F (37 C)   Resp 20   Ht '5\' 2"'$  (1.575 m)   Wt 159 lb 8 oz (72.3 kg)   SpO2 97%   BMI 29.17 kg/m   Outpatient Encounter Medications as of 06/12/2021  Medication Sig   acetaminophen (TYLENOL) 325 MG tablet Take 650 mg by mouth every 8 (eight) hours.   albuterol (VENTOLIN HFA) 108 (90 Base) MCG/ACT inhaler Inhale 1 puff into the lungs every 4 (four) hours as needed for wheezing or shortness of breath.   Amino Acids-Protein Hydrolys (FEEDING SUPPLEMENT, PRO-STAT SUGAR FREE 64,) LIQD Take 30 mLs by mouth 3 (three) times daily with meals.   Artificial Saliva (BIOTENE MOISTURIZING MOUTH MT) Use as directed 1 application in the mouth or throat at bedtime. 9 pm   aspirin EC 81 MG tablet Take 81 mg by mouth daily. Swallow whole.9 am   atorvastatin (LIPITOR) 20 MG tablet Take 1 tablet (20 mg total) by mouth every evening.   Balsam [place]-Castor Oil (VENELEX) OINT Apply topically. Apply to sacrum, coccyx, bilateral buttocks qshift for prevention.   chlorthalidone (HYGROTON) 25 MG tablet Take 25  mg by mouth daily.   colchicine 0.6 MG tablet Take 0.6 mg by mouth 2 (two) times daily.   gabapentin (NEURONTIN) 300 MG capsule Take 300 mg by mouth at bedtime.   insulin glargine (LANTUS) 100 UNIT/ML injection Inject 20 Units into the skin at bedtime.   insulin lispro (HUMALOG) 100 UNIT/ML KwikPen Inject 5 Units into the skin 2 (two) times daily with a meal.   melatonin 3 MG TABS tablet Take 3 mg by mouth at bedtime.   mirtazapine (REMERON) 15 MG tablet Take 15 mg by mouth at bedtime.   NON FORMULARY Diet: NAS, Cons CHO   omeprazole (PRILOSEC) 40 MG capsule Take 40 mg by mouth daily.   rOPINIRole (REQUIP) 1 MG tablet Take 1 mg by mouth at bedtime.   tiZANidine (ZANAFLEX) 2 MG tablet Take 2 mg by mouth every 6 (six) hours as needed for muscle spasms (for back and sciatic pain).   [DISCONTINUED] aspirin EC 81 MG tablet Take 81 mg by mouth daily. Swallow whole.   No  facility-administered encounter medications on file as of 06/12/2021.     SIGNIFICANT DIAGNOSTIC EXAMS  PREVIOUS   12-02-20; ct of head: No acute intracranial abnormality. Polypoid sinus disease.  12-02-20: ct of cervical spine:  Postoperative and degenerative changes in the cervical spine. No acute bony abnormality.  12-02-20; ct of pelvis:  1. No evidence of acute fracture or dislocation of the pelvis or hips. 2. 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 3. Degenerative changes in the hips with subcortical cysts on the left hip.  12-02-20: ct of left knee:  1. Subtle acute nondisplaced fracture through the medial aspect of the head of the fibula. 2. 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 3. There is a small joint effusion.  12-02-20: mri left hip:  1. No acute findings involving the left hip. 2. Mild chronic bilateral hip avascular necrosis. Moderate degenerative hip findings bilaterally. No hip effusion or regional bursitis. 3. Mild left greater than right hamstring tendinopathy. 4. 1.3 cm left eccentric Bartholin's cyst or Gartner cyst.  12-02-20: mri lumbar spine:  1. 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 2. L3-4: Bilateral posterolateral disc bulges, more prominent on the left. Mild left foraminal encroachment that could possibly affect the left L3 nerve. Moderate to severe bilateral facet osteoarthritis. These findings could relate to back pain or referred facet syndrome pain. 3. L4-5: Bilateral facet arthropathy with 2 mm of anterolisthesis. Bulging of the disc more prominent towards the right. Mild narrowing of the lateral recesses and of the intervertebral foramen on the right, but without visible neural compression. Findings at this level could relate to back pain or referred facet syndrome pain. 4. L5-S1: Previous left hemilaminectomy. Endplate osteophytes and bulging of the disc more  prominent towards the left. Facet degeneration and hypertrophy left more than right with left foraminal stenosis and subarticular lateral recess stenosis could cause left-sided neural compression.   01-25-21: ct of head; maxillofacial cervical spine:  1. No acute intracranial pathology. 2. No acute/traumatic cervical spine pathology. 3. No acute facial bone fractures.  01-26-21: ct of chest abdomen and pelvis:  1. No noncontrast CT evidence of acute traumatic injury to the chest, abdomen, or pelvis. 2. Distended urinary bladder. Mild bilateral hydronephrosis and hydroureter to the bilateral ureterovesicular junctions. No obstructing calculus or other etiology identified. Correlate for urinary retention. 3. No fracture or dislocation of the lumbar spine. Moderate disc space height loss and osteophytosis at L5-S1. Probable  small broad-based posterior disc bulges at L4-L5 and L5-S1. 4. Coronary artery disease. Aortic Atherosclerosis  01-26-21: ct of right shoulder:  1.  No acute osseous injury of the right shoulder. 2. Mild mineralization in the infraspinatus tendon as can be seen with calcific tendinosis.  01-29-21: MRI of spine:  Postoperative and multilevel degenerative changes of the cervical spine. There is severe canal stenosis with cord compression at C3-C4. Abnormal cord signal is present just below this level (at operative levels) and may reflect edema or myelomalacia No significant degenerative changes of the thoracic spine. Multilevel degenerative changes of lumbar spine similar to recent prior study.  01-30-21: renal ultrasound:  1. No acute abnormality. Small amount of right perinephric fluid, nonspecific.  02-05-21: chest x-ray:  1. Right internal jugular central venous catheter with tip over the mid SVC. No pneumothorax. 2. Hazy lung base opacities likely represent combination of atelectasis and pleural fluid. Vascular congestion.   03-06-21 chest x-ray: left lower lobe infiltrate  likely representing pneumonia   03-26-21: lumbar x-ray: mild to moderate degenerative changes in lumbar spine  03-27-21; DEXA scan: t score -1.777   NO NEW EXAMS.    LABS REVIEWED PREVIOUS   12-02-20: wbc 6.4; hgb 11.3; hct 34.4; mcv 93.0 plt 216; glucose 209; bun 33; creat 1.13; k+ 4.3; na++ 137; ca 9.0; GFR 49; hgb a1c 8.6 12-03-20: wbc 6.5; hgb 9.8; hct 30.5 mcv 93.8 plt 182; glucose 188;bun 41; creat 1.49; k+ 4.4; na++ 134; ca 8.2; GFR 35 12-04-20; glucose 285; bun 29; creat 1.00; k+ 4.3; na++ 138; ca 8.7 GFR 56  01-25-21: wbc 6.4; hgb 10.6; hct 31.7; mcv 91.1 plt 227; glucose 152; bun 40; creat 1.2 k+ 4.2; na++ 135; ca 8.7; GFR 42; liver normal albumin 3.5 01-29-21: urine and blood culture: klebsiella pneumoniae 01-31-21: wbc 13.1; hgb 8.3; hct 23.6; mcv 88.1 plt 153; glucose 162; bun 67; creat 2.77; k+ 4.8; na++ 125; ca 7.5; GFR 17; ast 62; alt 57; albumin 1.7 02-04-21: wbc 9.8; hgb 7.6; hct 22.0; mcv 85.3 plt 212; glucose 161 bun 40; creat 1.40; k+ 3.4; na++ 127; ca 7.5; GFR 38; ast 62; alt 57; albumin 1.7  02-08-21: wbc 10.4; hgb 9.2; hct 27.6; mcv 87.3 plt 433; glucose 186; bun 23; creat 1.00 ;k+ 4.4; na++ 131; ca 8.1; GFR 56; ast 60; alt 61; albumin 2.1  02-20-21: hgb 9.4; hct 29.9 glucose 154; bun 51; creat 1.17; k+ 4.5; na++ 136; ca 8.9; GFR 47 02-24-21: glucose 175; bun 62; creat 1.13; k+ 4.5; na++ 133; ca 9.0 GFR 49 03-06-21: wbc 5.3; hgb 8.8; hct 27.1; mcv 93.4 plt 262; glucose 453; bun 66; creat 1.30; k+ 4.6; na++ 135; ca 9.0; GFR 41; d-dimer: 2.16; CRP 3.0  03-13-21: d-dimer: 1.49 03-20-21: hep C nr; d-dimer 0.86 03-27-21: d-dimer 0.52 03-31-21: wbc 5.5; hgb 8.5; hct 26.8; mcv 93.4 plt 203; hgb a1c 7.6; chol 130; ldl 68; trig 138; hdl 34; urine micro-albumin 25.1  NO NEW LABS.   Review of Systems  Constitutional:  Negative for malaise/fatigue.  Respiratory:  Negative for cough and shortness of breath.   Cardiovascular:  Negative for chest pain, palpitations and leg swelling.   Gastrointestinal:  Negative for abdominal pain, constipation and heartburn.  Musculoskeletal:  Positive for joint pain. Negative for back pain and myalgias.       Right index finger pain and swelling   Skin: Negative.   Neurological:  Negative for dizziness.  Psychiatric/Behavioral:  The patient is not nervous/anxious.  Physical Exam Constitutional:      General: She is not in acute distress.    Appearance: She is well-developed. She is not diaphoretic.  Neck:     Thyroid: No thyromegaly.  Cardiovascular:     Rate and Rhythm: Normal rate and regular rhythm.     Pulses: Normal pulses.     Heart sounds: Normal heart sounds.  Pulmonary:     Effort: Pulmonary effort is normal. No respiratory distress.     Breath sounds: Normal breath sounds.  Abdominal:     General: Bowel sounds are normal. There is no distension.     Palpations: Abdomen is soft.     Tenderness: There is no abdominal tenderness.  Musculoskeletal:     Cervical back: Neck supple.     Right lower leg: No edema.     Left lower leg: No edema.     Comments:   Is able to move all extremities     Right index finer joint is red warm swollen and tender to touch   Lymphadenopathy:     Cervical: No cervical adenopathy.  Skin:    General: Skin is warm and dry.  Neurological:     Mental Status: She is alert. Mental status is at baseline.  Psychiatric:        Mood and Affect: Mood normal.      ASSESSMENT/ PLAN:  TODAY   Acute gout right hand unspecified cause for right index finger.  Will begin colchicine 0.6 mg twice daily through 06-14-21    [name] NP [facility]  Contact [phone] Monday through Friday 8am- 5pm  After hours call [phone]

## 2021-06-15 ENCOUNTER — Other Ambulatory Visit (HOSPITAL_COMMUNITY)
Admission: RE | Admit: 2021-06-15 | Discharge: 2021-06-15 | Disposition: A | Discharge status: Home / Self Care | Payer: Medicare HMO | Source: Skilled Nursing Facility | Attending: Internal Medicine | Admitting: Internal Medicine

## 2021-06-15 ENCOUNTER — Telehealth: Payer: Self-pay | Admitting: Family

## 2021-06-15 DIAGNOSIS — I52 Other heart disorders in diseases classified elsewhere: Secondary | ICD-10-CM | POA: Insufficient documentation

## 2021-06-15 DIAGNOSIS — N1832 Chronic kidney disease, stage 3b: Secondary | ICD-10-CM | POA: Insufficient documentation

## 2021-06-15 LAB — COMPREHENSIVE METABOLIC PANEL
ALT: 33 U/L (ref 0–44)
AST: 35 U/L (ref 15–41)
Albumin: 3.3 g/dL — ABNORMAL LOW (ref 3.5–5.0)
Alkaline Phosphatase: 128 U/L — ABNORMAL HIGH (ref 38–126)
Anion gap: 13 (ref 5–15)
BUN: 65 mg/dL — ABNORMAL HIGH (ref 8–23)
CO2: 19 mmol/L — ABNORMAL LOW (ref 22–32)
Calcium: 8.9 mg/dL (ref 8.9–10.3)
Chloride: 99 mmol/L (ref 98–111)
Creatinine, Ser: 1.33 mg/dL — ABNORMAL HIGH (ref 0.44–1.00)
GFR, Estimated: 40 mL/min — ABNORMAL LOW (ref >60)
Glucose, Bld: 187 mg/dL — ABNORMAL HIGH (ref 70–99)
Potassium: 4.2 mmol/L (ref 3.5–5.1)
Sodium: 131 mmol/L — ABNORMAL LOW (ref 135–145)
Total Bilirubin: 0.3 mg/dL (ref 0.3–1.2)
Total Protein: 7.6 g/dL (ref 6.5–8.1)

## 2021-06-15 LAB — CBC WITH DIFFERENTIAL/PLATELET
Abs Immature Granulocytes: 0.01 K/uL (ref 0.00–0.07)
Basophils Absolute: 0 K/uL (ref 0.0–0.1)
Basophils Relative: 0 %
Eosinophils Absolute: 0 K/uL (ref 0.0–0.5)
Eosinophils Relative: 1 %
HCT: 29.4 % — ABNORMAL LOW (ref 36.0–46.0)
Hemoglobin: 9.8 g/dL — ABNORMAL LOW (ref 12.0–15.0)
Immature Granulocytes: 0 %
Lymphocytes Relative: 31 %
Lymphs Abs: 1.9 K/uL (ref 0.7–4.0)
MCH: 30.2 pg (ref 26.0–34.0)
MCHC: 33.3 g/dL (ref 30.0–36.0)
MCV: 90.7 fL (ref 80.0–100.0)
Monocytes Absolute: 0.5 K/uL (ref 0.1–1.0)
Monocytes Relative: 9 %
Neutro Abs: 3.6 K/uL (ref 1.7–7.7)
Neutrophils Relative %: 59 %
Platelets: 242 K/uL (ref 150–400)
RBC: 3.24 MIL/uL — ABNORMAL LOW (ref 3.87–5.11)
RDW: 13.9 % (ref 11.5–15.5)
WBC: 6 K/uL (ref 4.0–10.5)
nRBC: 0 % (ref 0.0–0.2)

## 2021-06-15 NOTE — Telephone Encounter (Signed)
Call received from facility Nurse at [facility] states patient complaining of ongoing Nausea but no vomiting.Zofran was given.Patient daughter brought in some soup and patient was able to tolerate well .Nurse states patient requesting to be send to ED for evaluation of Nausea.Her vital signs are stable.Has had normal bowel movement today and no abdominal distention.CBC/diff and CMP ordered for evaluation.No symptoms of UTI reported.Please follow up.

## 2021-06-16 ENCOUNTER — Non-Acute Institutional Stay (SKILLED_NURSING_FACILITY): Payer: Medicare HMO | Admitting: Adult Health

## 2021-06-16 ENCOUNTER — Encounter: Payer: Self-pay | Admitting: Adult Health

## 2021-06-16 DIAGNOSIS — S14124D Central cord syndrome at C4 level of cervical spinal cord, subsequent encounter: Secondary | ICD-10-CM | POA: Diagnosis not present

## 2021-06-16 DIAGNOSIS — R0989 Other specified symptoms and signs involving the circulatory and respiratory systems: Secondary | ICD-10-CM | POA: Diagnosis not present

## 2021-06-16 DIAGNOSIS — M25511 Pain in right shoulder: Secondary | ICD-10-CM | POA: Diagnosis not present

## 2021-06-16 DIAGNOSIS — R279 Unspecified lack of coordination: Secondary | ICD-10-CM | POA: Diagnosis not present

## 2021-06-16 DIAGNOSIS — R140 Abdominal distension (gaseous): Secondary | ICD-10-CM | POA: Diagnosis not present

## 2021-06-16 DIAGNOSIS — K2900 Acute gastritis without bleeding: Secondary | ICD-10-CM | POA: Diagnosis not present

## 2021-06-16 DIAGNOSIS — M6281 Muscle weakness (generalized): Secondary | ICD-10-CM | POA: Diagnosis not present

## 2021-06-16 DIAGNOSIS — R112 Nausea with vomiting, unspecified: Secondary | ICD-10-CM | POA: Diagnosis not present

## 2021-06-16 DIAGNOSIS — R062 Wheezing: Secondary | ICD-10-CM | POA: Diagnosis not present

## 2021-06-16 DIAGNOSIS — M25512 Pain in left shoulder: Secondary | ICD-10-CM | POA: Diagnosis not present

## 2021-06-16 DIAGNOSIS — R1310 Dysphagia, unspecified: Secondary | ICD-10-CM | POA: Diagnosis not present

## 2021-06-16 DIAGNOSIS — R498 Other voice and resonance disorders: Secondary | ICD-10-CM | POA: Diagnosis not present

## 2021-06-16 NOTE — Progress Notes (Signed)
Location:  [facility] Room Number: [place] of Service:  SNF (31)   CODE STATUS: DNR  Allergies  Allergen Reactions   Codeine     Chief Complaint  Patient presents with   Acute Visit    Change in Status    HPI:  She has been having increased weakness present; she has nausea and vomiting present. She denies any diarrhea or constipation present. There have been no reports of fevers present. She has not had any vomiting today; was able to tolerate a little bit oatmeal this morning.   Past Medical History:  Diagnosis Date   DM type 2 with diabetic peripheral neuropathy (HCC)    High cholesterol    Neck pain    Neuropathy     Past Surgical History:  Procedure Laterality Date   ABDOMINAL HYSTERECTOMY     ANTERIOR CERVICAL DECOMP/DISCECTOMY FUSION N/A 02/05/2021   Procedure: Cervical Three-Four  Anterior cervical decompression/discectomy/fusion;  Surgeon: Ashok Pall, MD;  Location: [facility];  Service: Neurosurgery;  Laterality: N/A;  RM 20   APPENDECTOMY     BACK SURGERY     CERVICAL DISC SURGERY     HAND SURGERY     KNEE SURGERY      Social History   Socioeconomic History   Marital status: Widowed    Spouse name: Not on file   Number of children: Not on file   Years of education: Not on file   Highest education level: Not on file  Occupational History   Not on file  Tobacco Use   Smoking status: Never   Smokeless tobacco: Never  Vaping Use   Vaping Use: Never used  Substance and Sexual Activity   Alcohol use: Never   Drug use: Never   Sexual activity: Never  Other Topics Concern   Not on file  Social History Narrative   Not on file   Social Determinants of Health   Financial Resource Strain: Not on file  Food Insecurity: Not on file  Transportation Needs: Not on file  Physical Activity: Not on file  Stress: Not on file  Social Connections: Not on file  Intimate Partner Violence: Not on file   Family History  Problem  Relation Age of Onset   Cardiomyopathy Father    Cancer Mother       VITAL SIGNS BP (!) 141/67   Pulse 88   Temp 97.7 F (36.5 C)   Resp 20   Ht 5' 2"  (1.575 m)   Wt 159 lb 8 oz (72.3 kg)   SpO2 97%   BMI 29.17 kg/m   Outpatient Encounter Medications as of 06/16/2021  Medication Sig   acetaminophen (TYLENOL) 325 MG tablet Take 650 mg by mouth every 8 (eight) hours.   albuterol (VENTOLIN HFA) 108 (90 Base) MCG/ACT inhaler Inhale 1 puff into the lungs every 4 (four) hours as needed for wheezing or shortness of breath.   Amino Acids-Protein Hydrolys (FEEDING SUPPLEMENT, PRO-STAT SUGAR FREE 64,) LIQD Take 30 mLs by mouth 3 (three) times daily with meals.   Artificial Saliva (BIOTENE MOISTURIZING MOUTH MT) Use as directed 1 application in the mouth or throat at bedtime. 9 pm   aspirin EC 81 MG tablet Take 81 mg by mouth daily. Swallow whole.9 am   atorvastatin (LIPITOR) 20 MG tablet Take 1 tablet (20 mg total) by mouth every evening.   Balsam [place]-Castor Oil (VENELEX) OINT Apply topically. Apply to sacrum, coccyx, bilateral buttocks qshift for prevention.  chlorthalidone (HYGROTON) 25 MG tablet Take 25 mg by mouth daily.   insulin glargine (LANTUS) 100 UNIT/ML injection Inject 20 Units into the skin at bedtime.   insulin lispro (HUMALOG) 100 UNIT/ML KwikPen Inject 5 Units into the skin 2 (two) times daily with a meal.   melatonin 3 MG TABS tablet Take 3 mg by mouth at bedtime.   mirtazapine (REMERON) 15 MG tablet Take 15 mg by mouth at bedtime.   NON FORMULARY Diet: NAS, Cons CHO   omeprazole (PRILOSEC) 40 MG capsule Take 40 mg by mouth daily.   ondansetron (ZOFRAN-ODT) 8 MG disintegrating tablet Take 8 mg by mouth. Before Meals and At Bedtime   rOPINIRole (REQUIP) 1 MG tablet Take 1 mg by mouth at bedtime.   Sodium Chloride 0.9 % KIT Inject into the vein. 75 cc per hour; intravenous Special Instructions: for 2 liters only for acute renal failure with n/v. Every Shift; Day,  Evening, Night   tiZANidine (ZANAFLEX) 2 MG tablet Take 2 mg by mouth every 6 (six) hours as needed for muscle spasms (for back and sciatic pain).   colchicine 0.6 MG tablet Take 0.6 mg by mouth 2 (two) times daily.   [DISCONTINUED] gabapentin (NEURONTIN) 300 MG capsule Take 300 mg by mouth at bedtime.   No facility-administered encounter medications on file as of 06/16/2021.     SIGNIFICANT DIAGNOSTIC EXAMS  PREVIOUS   12-02-20; ct of head: No acute intracranial abnormality. Polypoid sinus disease.  12-02-20: ct of cervical spine:  Postoperative and degenerative changes in the cervical spine. No acute bony abnormality.  12-02-20; ct of pelvis:  1. No evidence of acute fracture or dislocation of the pelvis or hips. 2. 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 3. Degenerative changes in the hips with subcortical cysts on the left hip.  12-02-20: ct of left knee:  1. Subtle acute nondisplaced fracture through the medial aspect of the head of the fibula. 2. 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 3. There is a small joint effusion.  12-02-20: mri left hip:  1. No acute findings involving the left hip. 2. Mild chronic bilateral hip avascular necrosis. Moderate degenerative hip findings bilaterally. No hip effusion or regional bursitis. 3. Mild left greater than right hamstring tendinopathy. 4. 1.3 cm left eccentric Bartholin's cyst or Gartner cyst.  12-02-20: mri lumbar spine:  1. 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 2. L3-4: Bilateral posterolateral disc bulges, more prominent on the left. Mild left foraminal encroachment that could possibly affect the left L3 nerve. Moderate to severe bilateral facet osteoarthritis. These findings could relate to back pain or referred facet syndrome pain. 3. L4-5: Bilateral facet arthropathy with 2 mm of anterolisthesis. Bulging of the disc more prominent towards the  right. Mild narrowing of the lateral recesses and of the intervertebral foramen on the right, but without visible neural compression. Findings at this level could relate to back pain or referred facet syndrome pain. 4. L5-S1: Previous left hemilaminectomy. Endplate osteophytes and bulging of the disc more prominent towards the left. Facet degeneration and hypertrophy left more than right with left foraminal stenosis and subarticular lateral recess stenosis could cause left-sided neural compression.   01-25-21: ct of head; maxillofacial cervical spine:  1. No acute intracranial pathology. 2. No acute/traumatic cervical spine pathology. 3. No acute facial bone fractures.  01-26-21: ct of chest abdomen and pelvis:  1. No noncontrast CT evidence of acute traumatic injury to the chest, abdomen, or pelvis. 2.  Distended urinary bladder. Mild bilateral hydronephrosis and hydroureter to the bilateral ureterovesicular junctions. No obstructing calculus or other etiology identified. Correlate for urinary retention. 3. No fracture or dislocation of the lumbar spine. Moderate disc space height loss and osteophytosis at L5-S1. Probable small broad-based posterior disc bulges at L4-L5 and L5-S1. 4. Coronary artery disease. Aortic Atherosclerosis  01-26-21: ct of right shoulder:  1.  No acute osseous injury of the right shoulder. 2. Mild mineralization in the infraspinatus tendon as can be seen with calcific tendinosis.  01-29-21: MRI of spine:  Postoperative and multilevel degenerative changes of the cervical spine. There is severe canal stenosis with cord compression at C3-C4. Abnormal cord signal is present just below this level (at operative levels) and may reflect edema or myelomalacia No significant degenerative changes of the thoracic spine. Multilevel degenerative changes of lumbar spine similar to recent prior study.  01-30-21: renal ultrasound:  1. No acute abnormality. Small amount of right perinephric  fluid, nonspecific.  02-05-21: chest x-ray:  1. Right internal jugular central venous catheter with tip over the mid SVC. No pneumothorax. 2. Hazy lung base opacities likely represent combination of atelectasis and pleural fluid. Vascular congestion.   03-06-21 chest x-ray: left lower lobe infiltrate likely representing pneumonia   03-26-21: lumbar x-ray: mild to moderate degenerative changes in lumbar spine  03-27-21; DEXA scan: t score -1.777   NO NEW EXAMS.    LABS REVIEWED PREVIOUS   12-02-20: wbc 6.4; hgb 11.3; hct 34.4; mcv 93.0 plt 216; glucose 209; bun 33; creat 1.13; k+ 4.3; na++ 137; ca 9.0; GFR 49; hgb a1c 8.6 12-03-20: wbc 6.5; hgb 9.8; hct 30.5 mcv 93.8 plt 182; glucose 188;bun 41; creat 1.49; k+ 4.4; na++ 134; ca 8.2; GFR 35 12-04-20; glucose 285; bun 29; creat 1.00; k+ 4.3; na++ 138; ca 8.7 GFR 56  01-25-21: wbc 6.4; hgb 10.6; hct 31.7; mcv 91.1 plt 227; glucose 152; bun 40; creat 1.2 k+ 4.2; na++ 135; ca 8.7; GFR 42; liver normal albumin 3.5 01-29-21: urine and blood culture: klebsiella pneumoniae 01-31-21: wbc 13.1; hgb 8.3; hct 23.6; mcv 88.1 plt 153; glucose 162; bun 67; creat 2.77; k+ 4.8; na++ 125; ca 7.5; GFR 17; ast 62; alt 57; albumin 1.7 02-04-21: wbc 9.8; hgb 7.6; hct 22.0; mcv 85.3 plt 212; glucose 161 bun 40; creat 1.40; k+ 3.4; na++ 127; ca 7.5; GFR 38; ast 62; alt 57; albumin 1.7  02-08-21: wbc 10.4; hgb 9.2; hct 27.6; mcv 87.3 plt 433; glucose 186; bun 23; creat 1.00 ;k+ 4.4; na++ 131; ca 8.1; GFR 56; ast 60; alt 61; albumin 2.1  02-20-21: hgb 9.4; hct 29.9 glucose 154; bun 51; creat 1.17; k+ 4.5; na++ 136; ca 8.9; GFR 47 02-24-21: glucose 175; bun 62; creat 1.13; k+ 4.5; na++ 133; ca 9.0 GFR 49 03-06-21: wbc 5.3; hgb 8.8; hct 27.1; mcv 93.4 plt 262; glucose 453; bun 66; creat 1.30; k+ 4.6; na++ 135; ca 9.0; GFR 41; d-dimer: 2.16; CRP 3.0  03-13-21: d-dimer: 1.49 03-20-21: hep C nr; d-dimer 0.86 03-27-21: d-dimer 0.52 03-31-21: wbc 5.5; hgb 8.5; hct 26.8; mcv 93.4 plt 203; hgb a1c  7.6; chol 130; ldl 68; trig 138; hdl 34; urine micro-albumin 25.1  TODAY  06-12-21: uric acid 5.9 06-15-21: wbc 6.0; hgb 9.8; hct 29.4; mcv 90,7 plt 242; glucose 187; bun 65; creat 1.33; k+ 4.2; na++ 131; ca 8.9; GFR 40; liver normal albumin 3.3    Review of Systems  Constitutional:  Positive for malaise/fatigue.  Respiratory:  Negative for cough and shortness of breath.   Cardiovascular:  Negative for chest pain, palpitations and leg swelling.  Gastrointestinal:  Positive for nausea and vomiting. Negative for abdominal pain, constipation, diarrhea and heartburn.  Musculoskeletal:  Negative for back pain, joint pain and myalgias.  Skin: Negative.   Neurological:  Positive for weakness. Negative for dizziness.  Psychiatric/Behavioral:  The patient is not nervous/anxious.    Physical Exam Constitutional:      General: She is not in acute distress.    Appearance: She is well-developed. She is not diaphoretic.  Neck:     Thyroid: No thyromegaly.  Cardiovascular:     Rate and Rhythm: Normal rate and regular rhythm.     Heart sounds: Normal heart sounds.  Pulmonary:     Effort: Pulmonary effort is normal. No respiratory distress.     Breath sounds: Normal breath sounds.  Abdominal:     General: Bowel sounds are normal. There is no distension.     Palpations: Abdomen is soft.     Tenderness: There is abdominal tenderness.     Comments: Mild generalized tenderness present   Musculoskeletal:     Cervical back: Neck supple.     Right lower leg: No edema.     Left lower leg: No edema.     Comments:   Is able to move all extremities      Lymphadenopathy:     Cervical: No cervical adenopathy.  Skin:    General: Skin is warm and dry.  Neurological:     Mental Status: She is alert. Mental status is at baseline.  Psychiatric:        Mood and Affect: Mood normal.     ASSESSMENT/ PLAN:  TODAY  Acute gastritis:  Will begin NS at 75 cc per hour for 2 liters Will begin zofrn 8 mg  AC and HS through 06-23-21 Will repeat BMP       [name] NP [facility]  Contact [phone] Monday through Friday 8am- 5pm  After hours call [phone]

## 2021-06-16 NOTE — Progress Notes (Signed)
This encounter was created in error - please disregard.

## 2021-06-17 ENCOUNTER — Non-Acute Institutional Stay (SKILLED_NURSING_FACILITY): Payer: Medicare HMO | Admitting: Internal Medicine

## 2021-06-17 ENCOUNTER — Encounter: Payer: Self-pay | Admitting: Internal Medicine

## 2021-06-17 DIAGNOSIS — R627 Adult failure to thrive: Secondary | ICD-10-CM | POA: Diagnosis not present

## 2021-06-17 DIAGNOSIS — S14124D Central cord syndrome at C4 level of cervical spinal cord, subsequent encounter: Secondary | ICD-10-CM | POA: Diagnosis not present

## 2021-06-17 DIAGNOSIS — E1140 Type 2 diabetes mellitus with diabetic neuropathy, unspecified: Secondary | ICD-10-CM

## 2021-06-17 DIAGNOSIS — N1832 Chronic kidney disease, stage 3b: Secondary | ICD-10-CM

## 2021-06-17 DIAGNOSIS — R1310 Dysphagia, unspecified: Secondary | ICD-10-CM | POA: Diagnosis not present

## 2021-06-17 DIAGNOSIS — M25511 Pain in right shoulder: Secondary | ICD-10-CM | POA: Diagnosis not present

## 2021-06-17 DIAGNOSIS — R279 Unspecified lack of coordination: Secondary | ICD-10-CM | POA: Diagnosis not present

## 2021-06-17 DIAGNOSIS — M6281 Muscle weakness (generalized): Secondary | ICD-10-CM | POA: Diagnosis not present

## 2021-06-17 DIAGNOSIS — M25512 Pain in left shoulder: Secondary | ICD-10-CM | POA: Diagnosis not present

## 2021-06-17 DIAGNOSIS — R498 Other voice and resonance disorders: Secondary | ICD-10-CM | POA: Diagnosis not present

## 2021-06-17 NOTE — Assessment & Plan Note (Addendum)
Current renal function reveals CKD stage IIIb with creatinine 1.33 and GFR 40.  This has progressed some from 6/27 when  creatinine was 1.13 and GFR 49. No nephrotoxic drugs identified in med list.  Progression may be related to poor oral intake of fluids.

## 2021-06-17 NOTE — Patient Instructions (Signed)
See assessment and plan under each diagnosis in the problem list and acutely for this visit 

## 2021-06-17 NOTE — Progress Notes (Signed)
NURSING HOME LOCATION: [facility] ROOM NUMBER:  27 W  CODE STATUS:  DNR  PCP:  Curlene Labrum MD  This is a nursing facility follow up visit for specific acute issue of adult failure to thrive.  Interim medical record and care since last SNF visit was updated with review of diagnostic studies and change in clinical status since last visit were documented. Most recent lab results are summarized & compared to prior results under Current Assessment & Plan in the Problem List.  HPI: Staff reports that oral intake of fluids and solids has been poor.  [place] NP has ordered normal saline at 75 cc/h x 2 L.  Evaluation has included a KUB which revealed only retained stool but no ileus.  Rectal suppository was ordered. Current labs reveal CKD stage IIIb with creatinine 1.33/GFR 40.  This represents some progression from 6/27 when creatinine was 1.13 and GFR 49.  Albumin is low at 3.3 but total protein is normal at 7.6.  She has mild hyponatremia with a sodium of 131.  Serially sodiums have ranged from the low of 131 up to the high of 136.  Diabetic control is adequate based on A1c of 7.6% on 8/1.  Serially her anemia is improved with the H/H rising from 8.5/26.8 up to 9.8/29.4.  White count and differential are normal. Medical diagnoses include diabetes with peripheral neuropathy, dyslipidemia, and degenerative disc disease.  Review of systems: Her chief complaint is chronic pain in the left shoulder.  She states that Dr. Aline Brochure diagnosed bursitis and gave her a shot of steroids 6 weeks ago. She does state that she did have some nausea but this is improved.  She denies any other GI symptoms such as dysphagia, dyspepsia, abdominal pain, or early satiety.  She made the comment that she does not eat enough "to get full".  Her daughter states that this is a recurrent pattern approximately once a month.  When I asked about depression she was ambivalent.  She went on to add that she did  not want to be here as it was "too crowded". When she began to discuss her current room status, she began to respond in a whisper. Her sister indicated that she was hoping to have her own room as she previously did, but she is now on [place]..  Constitutional: No fever, significant weight change  Eyes: No redness, discharge, pain, vision change ENT/mouth: No nasal congestion,  purulent discharge, earache, change in hearing, sore throat  Cardiovascular: No chest pain, palpitations, paroxysmal nocturnal dyspnea, claudication, edema  Respiratory: No cough, sputum production, hemoptysis, DOE, significant snoring, apnea   Gastrointestinal: No heartburn, dysphagia, abdominal pain, vomiting, rectal bleeding, melena, change in bowels Genitourinary: No dysuria, hematuria, pyuria, incontinence, nocturia Dermatologic: No rash, pruritus, change in appearance of skin Neurologic: No dizziness, headache, syncope, seizures, numbness, tingling Psychiatric: No significant anxiety, insomnia Endocrine: No change in hair/skin/nails, excessive thirst, excessive hunger, excessive urination  Hematologic/lymphatic: No significant bruising, lymphadenopathy, abnormal bleeding  Physical exam:  Pertinent or positive findings: Affect is markedly flat.  Ptosis is greater on the right than the left.  She has complete dentures.  Bowel sounds are active.  Pedal pulses are decreased.  Posterior tibial pulses appear to be stronger than dorsalis pedis pulses.  She is weak to opposition asymmetrically.  The left upper extremity seems weaker than the right and the right lower extremity weaker than the left.  General appearance: Adequately nourished; no acute distress, increased work of  breathing is present.   Lymphatic: No lymphadenopathy about the head, neck, axilla. Eyes: No conjunctival inflammation or lid edema is present. There is no scleral icterus. Ears:  External ear exam shows no significant lesions or deformities.   Nose:   External nasal examination shows no deformity or inflammation. Nasal mucosa are pink and moist without lesions, exudates Oral exam:  Lips  are healthy appearing. There is no oropharyngeal erythema or exudate. Neck:  No thyromegaly, masses, tenderness noted.    Heart:  Normal rate and regular rhythm. S1 and S2 normal without gallop, murmur, click, rub .  Lungs: Chest clear to auscultation without wheezes, rhonchi, rales, rubs. Abdomen: Bowel sounds are normal. Abdomen is soft and nontender with no organomegaly, hernias, masses. GU: Deferred  Extremities:  No cyanosis, clubbing, edema  Neurologic exam :Balance, Rhomberg, finger to nose testing could not be completed due to clinical state Deep tendon reflexes are equal Skin: Warm & dry w/o tenting. No significant lesions or rash.  See summary under each active problem in the Problem List with associated updated therapeutic plan

## 2021-06-17 NOTE — Assessment & Plan Note (Signed)
Discussed with [facility] NP possible change in room assignment once remodeling is complete.  Additionally consider trial of Cymbalta in view of the peripheral neuropathy and depression with associated anorexia.

## 2021-06-17 NOTE — Assessment & Plan Note (Signed)
03/31/2021 A1c was 7.6% indicating adequate control.  A1c goal at age 85 with multiple comorbidities would be less than 8%. Discussed trial of Cymbalta with [facility] NP in view of neuropathy and clinical depression.

## 2021-06-18 ENCOUNTER — Non-Acute Institutional Stay (SKILLED_NURSING_FACILITY): Payer: Medicare HMO | Admitting: Adult Health

## 2021-06-18 ENCOUNTER — Encounter: Payer: Self-pay | Admitting: Adult Health

## 2021-06-18 DIAGNOSIS — E1122 Type 2 diabetes mellitus with diabetic chronic kidney disease: Secondary | ICD-10-CM | POA: Diagnosis not present

## 2021-06-18 DIAGNOSIS — M87052 Idiopathic aseptic necrosis of left femur: Secondary | ICD-10-CM | POA: Diagnosis not present

## 2021-06-18 DIAGNOSIS — R1310 Dysphagia, unspecified: Secondary | ICD-10-CM | POA: Diagnosis not present

## 2021-06-18 DIAGNOSIS — M25512 Pain in left shoulder: Secondary | ICD-10-CM | POA: Diagnosis not present

## 2021-06-18 DIAGNOSIS — S14124D Central cord syndrome at C4 level of cervical spinal cord, subsequent encounter: Secondary | ICD-10-CM | POA: Diagnosis not present

## 2021-06-18 DIAGNOSIS — M25511 Pain in right shoulder: Secondary | ICD-10-CM | POA: Diagnosis not present

## 2021-06-18 DIAGNOSIS — R279 Unspecified lack of coordination: Secondary | ICD-10-CM | POA: Diagnosis not present

## 2021-06-18 DIAGNOSIS — D649 Anemia, unspecified: Secondary | ICD-10-CM

## 2021-06-18 DIAGNOSIS — E1142 Type 2 diabetes mellitus with diabetic polyneuropathy: Secondary | ICD-10-CM

## 2021-06-18 DIAGNOSIS — M87051 Idiopathic aseptic necrosis of right femur: Secondary | ICD-10-CM

## 2021-06-18 DIAGNOSIS — N183 Chronic kidney disease, stage 3 unspecified: Secondary | ICD-10-CM | POA: Diagnosis not present

## 2021-06-18 DIAGNOSIS — K2900 Acute gastritis without bleeding: Secondary | ICD-10-CM | POA: Insufficient documentation

## 2021-06-18 DIAGNOSIS — R498 Other voice and resonance disorders: Secondary | ICD-10-CM | POA: Diagnosis not present

## 2021-06-18 DIAGNOSIS — M6281 Muscle weakness (generalized): Secondary | ICD-10-CM | POA: Diagnosis not present

## 2021-06-18 NOTE — Progress Notes (Signed)
Location:  [facility] Room Number: 154-W Place of Service:  SNF (31) Provider: [name], NP   CODE STATUS: DNR  Allergies  Allergen Reactions   Codeine     Chief Complaint  Patient presents with   Medical Management of Chronic Issues            Lumbar radicular syndrome/vascular necrosis bilateral hips: Diabetic peripheral neuropathy:  CKD stage 3b due to type 2 diabetes mellitus:  Normocytic anemia:       HPI:  She is a 85 year old long term resident of this facility being seen for the management of her chronic illnesses: Lumbar radicular syndrome/vascular necrosis bilateral hips: Diabetic peripheral neuropathy:  CKD stage 3b due to type 2 diabetes mellitus:  Normocytic anemia. There are no reports of uncontrolled pain; she does get out of bed daily; no reports of anxiety or depressive thoughts.   Past Medical History:  Diagnosis Date   DM type 2 with diabetic peripheral neuropathy (HCC)    High cholesterol    Neck pain    Neuropathy     Past Surgical History:  Procedure Laterality Date   ABDOMINAL HYSTERECTOMY     ANTERIOR CERVICAL DECOMP/DISCECTOMY FUSION N/A 02/05/2021   Procedure: Cervical Three-Four  Anterior cervical decompression/discectomy/fusion;  Surgeon: Ashok Pall, MD;  Location: [facility];  Service: Neurosurgery;  Laterality: N/A;  RM 20   APPENDECTOMY     BACK SURGERY     CERVICAL DISC SURGERY     HAND SURGERY     KNEE SURGERY      Social History   Socioeconomic History   Marital status: Widowed    Spouse name: Not on file   Number of children: Not on file   Years of education: Not on file   Highest education level: Not on file  Occupational History   Not on file  Tobacco Use   Smoking status: Never   Smokeless tobacco: Never  Vaping Use   Vaping Use: Never used  Substance and Sexual Activity   Alcohol use: Never   Drug use: Never   Sexual activity: Never  Other Topics Concern   Not on file  Social History  Narrative   Not on file   Social Determinants of Health   Financial Resource Strain: Not on file  Food Insecurity: Not on file  Transportation Needs: Not on file  Physical Activity: Not on file  Stress: Not on file  Social Connections: Not on file  Intimate Partner Violence: Not on file   Family History  Problem Relation Age of Onset   Cardiomyopathy Father    Cancer Mother       VITAL SIGNS BP (!) 122/49   Pulse 68   Temp (!) 97 F (36.1 C)   Resp 18   Ht 5' 2"  (1.575 m)   Wt 159 lb 8 oz (72.3 kg)   SpO2 97%   BMI 29.17 kg/m   Outpatient Encounter Medications as of 06/18/2021  Medication Sig   acetaminophen (TYLENOL) 325 MG tablet Take 650 mg by mouth every 8 (eight) hours.   albuterol (VENTOLIN HFA) 108 (90 Base) MCG/ACT inhaler Inhale 1 puff into the lungs every 4 (four) hours as needed for wheezing or shortness of breath.   Amino Acids-Protein Hydrolys (FEEDING SUPPLEMENT, PRO-STAT SUGAR FREE 64,) LIQD Take 30 mLs by mouth 3 (three) times daily with meals.   Artificial Saliva (BIOTENE MOISTURIZING MOUTH MT) Use as directed 1 application in the mouth or  throat at bedtime. 9 pm   aspirin EC 81 MG tablet Take 81 mg by mouth daily. Swallow whole.9 am   atorvastatin (LIPITOR) 20 MG tablet Take 1 tablet (20 mg total) by mouth every evening.   Balsam [place]-Castor Oil (VENELEX) OINT Apply topically. Apply to sacrum, coccyx, bilateral buttocks qshift for prevention.   chlorthalidone (HYGROTON) 25 MG tablet Take 25 mg by mouth daily.   colchicine 0.6 MG tablet Take 0.6 mg by mouth daily as needed    insulin glargine (LANTUS) 100 UNIT/ML injection Inject 20 Units into the skin at bedtime.   insulin lispro (HUMALOG) 100 UNIT/ML KwikPen Inject 5 Units into the skin 2 (two) times daily with a meal.   melatonin 3 MG TABS tablet Take 3 mg by mouth at bedtime.   mirtazapine (REMERON) 15 MG tablet Take 15 mg by mouth at bedtime.   NON FORMULARY Diet: NAS, Cons CHO   omeprazole  (PRILOSEC) 40 MG capsule Take 40 mg by mouth daily.   ondansetron (ZOFRAN-ODT) 8 MG disintegrating tablet Take 8 mg by mouth. Before Meals and At Bedtime   rOPINIRole (REQUIP) 1 MG tablet Take 1 mg by mouth at bedtime.   Sodium Chloride 0.9 % KIT Inject into the vein. 75 cc per hour; intravenous Special Instructions: for 2 liters only for acute renal failure with n/v. Every Shift; Day, Evening, Night   tiZANidine (ZANAFLEX) 2 MG tablet Take 2 mg by mouth every 6 (six) hours as needed for muscle spasms (for back and sciatic pain).   No facility-administered encounter medications on file as of 06/18/2021.     SIGNIFICANT DIAGNOSTIC EXAMS  PREVIOUS   12-02-20; ct of head: No acute intracranial abnormality. Polypoid sinus disease.  12-02-20: ct of cervical spine:  Postoperative and degenerative changes in the cervical spine. No acute bony abnormality.  12-02-20; ct of pelvis:  1. No evidence of acute fracture or dislocation of the pelvis or hips. 2. 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 3. Degenerative changes in the hips with subcortical cysts on the left hip.  12-02-20: ct of left knee:  1. Subtle acute nondisplaced fracture through the medial aspect of the head of the fibula. 2. 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 3. There is a small joint effusion.  12-02-20: mri left hip:  1. No acute findings involving the left hip. 2. Mild chronic bilateral hip avascular necrosis. Moderate degenerative hip findings bilaterally. No hip effusion or regional bursitis. 3. Mild left greater than right hamstring tendinopathy. 4. 1.3 cm left eccentric Bartholin's cyst or Gartner cyst.  12-02-20: mri lumbar spine:  1. 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 2. L3-4: Bilateral posterolateral disc bulges, more prominent on the left. Mild left foraminal encroachment that could possibly affect the left L3 nerve.  Moderate to severe bilateral facet osteoarthritis. These findings could relate to back pain or referred facet syndrome pain. 3. L4-5: Bilateral facet arthropathy with 2 mm of anterolisthesis. Bulging of the disc more prominent towards the right. Mild narrowing of the lateral recesses and of the intervertebral foramen on the right, but without visible neural compression. Findings at this level could relate to back pain or referred facet syndrome pain. 4. L5-S1: Previous left hemilaminectomy. Endplate osteophytes and bulging of the disc more prominent towards the left. Facet degeneration and hypertrophy left more than right with left foraminal stenosis and subarticular lateral recess stenosis could cause left-sided neural compression.   01-25-21: ct of head; maxillofacial cervical spine:  1. No acute intracranial pathology. 2. No acute/traumatic cervical spine pathology. 3. No acute facial bone fractures.  01-26-21: ct of chest abdomen and pelvis:  1. No noncontrast CT evidence of acute traumatic injury to the chest, abdomen, or pelvis. 2. Distended urinary bladder. Mild bilateral hydronephrosis and hydroureter to the bilateral ureterovesicular junctions. No obstructing calculus or other etiology identified. Correlate for urinary retention. 3. No fracture or dislocation of the lumbar spine. Moderate disc space height loss and osteophytosis at L5-S1. Probable small broad-based posterior disc bulges at L4-L5 and L5-S1. 4. Coronary artery disease. Aortic Atherosclerosis  01-26-21: ct of right shoulder:  1.  No acute osseous injury of the right shoulder. 2. Mild mineralization in the infraspinatus tendon as can be seen with calcific tendinosis.  01-29-21: MRI of spine:  Postoperative and multilevel degenerative changes of the cervical spine. There is severe canal stenosis with cord compression at C3-C4. Abnormal cord signal is present just below this level (at operative levels) and may reflect edema or  myelomalacia No significant degenerative changes of the thoracic spine. Multilevel degenerative changes of lumbar spine similar to recent prior study.  01-30-21: renal ultrasound:  1. No acute abnormality. Small amount of right perinephric fluid, nonspecific.  02-05-21: chest x-ray:  1. Right internal jugular central venous catheter with tip over the mid SVC. No pneumothorax. 2. Hazy lung base opacities likely represent combination of atelectasis and pleural fluid. Vascular congestion.   03-06-21 chest x-ray: left lower lobe infiltrate likely representing pneumonia   03-26-21: lumbar x-ray: mild to moderate degenerative changes in lumbar spine  03-27-21; DEXA scan: t score -1.777   NO NEW EXAMS.    LABS REVIEWED PREVIOUS   12-02-20: wbc 6.4; hgb 11.3; hct 34.4; mcv 93.0 plt 216; glucose 209; bun 33; creat 1.13; k+ 4.3; na++ 137; ca 9.0; GFR 49; hgb a1c 8.6 12-03-20: wbc 6.5; hgb 9.8; hct 30.5 mcv 93.8 plt 182; glucose 188;bun 41; creat 1.49; k+ 4.4; na++ 134; ca 8.2; GFR 35 12-04-20; glucose 285; bun 29; creat 1.00; k+ 4.3; na++ 138; ca 8.7 GFR 56  01-25-21: wbc 6.4; hgb 10.6; hct 31.7; mcv 91.1 plt 227; glucose 152; bun 40; creat 1.2 k+ 4.2; na++ 135; ca 8.7; GFR 42; liver normal albumin 3.5 01-29-21: urine and blood culture: klebsiella pneumoniae 01-31-21: wbc 13.1; hgb 8.3; hct 23.6; mcv 88.1 plt 153; glucose 162; bun 67; creat 2.77; k+ 4.8; na++ 125; ca 7.5; GFR 17; ast 62; alt 57; albumin 1.7 02-04-21: wbc 9.8; hgb 7.6; hct 22.0; mcv 85.3 plt 212; glucose 161 bun 40; creat 1.40; k+ 3.4; na++ 127; ca 7.5; GFR 38; ast 62; alt 57; albumin 1.7  02-08-21: wbc 10.4; hgb 9.2; hct 27.6; mcv 87.3 plt 433; glucose 186; bun 23; creat 1.00 ;k+ 4.4; na++ 131; ca 8.1; GFR 56; ast 60; alt 61; albumin 2.1  02-20-21: hgb 9.4; hct 29.9 glucose 154; bun 51; creat 1.17; k+ 4.5; na++ 136; ca 8.9; GFR 47 02-24-21: glucose 175; bun 62; creat 1.13; k+ 4.5; na++ 133; ca 9.0 GFR 49 03-06-21: wbc 5.3; hgb 8.8; hct 27.1; mcv 93.4  plt 262; glucose 453; bun 66; creat 1.30; k+ 4.6; na++ 135; ca 9.0; GFR 41; d-dimer: 2.16; CRP 3.0  03-13-21: d-dimer: 1.49 03-20-21: hep C nr; d-dimer 0.86 03-27-21: d-dimer 0.52 03-31-21: wbc 5.5; hgb 8.5; hct 26.8; mcv 93.4 plt 203; hgb a1c 7.6; chol 130; ldl 68; trig 138; hdl 34; urine micro-albumin 25.1 06-12-21: uric acid 5.9 06-15-21: wbc 6.0; hgb 9.8;  hct 29.4; mcv 90,7 plt 242; glucose 187; bun 65; creat 1.33; k+ 4.2; na++ 131; ca 8.9; GFR 40; liver normal albumin 3.3   NO NEW LABS.   Review of Systems  Constitutional:  Negative for malaise/fatigue.  Respiratory:  Negative for cough and shortness of breath.   Cardiovascular:  Negative for chest pain, palpitations and leg swelling.  Gastrointestinal:  Negative for abdominal pain, constipation and heartburn.  Musculoskeletal:  Negative for back pain, joint pain and myalgias.  Skin: Negative.   Neurological:  Negative for dizziness.  Psychiatric/Behavioral:  The patient is not nervous/anxious.    Physical Exam Constitutional:      General: She is not in acute distress.    Appearance: She is well-developed. She is not diaphoretic.  Neck:     Thyroid: No thyromegaly.  Cardiovascular:     Rate and Rhythm: Normal rate and regular rhythm.     Pulses: Normal pulses.     Heart sounds: Normal heart sounds.  Pulmonary:     Effort: Pulmonary effort is normal. No respiratory distress.     Breath sounds: Normal breath sounds.  Abdominal:     General: Bowel sounds are normal. There is no distension.     Palpations: Abdomen is soft.     Tenderness: There is no abdominal tenderness.  Musculoskeletal:     Cervical back: Neck supple.     Right lower leg: No edema.     Left lower leg: No edema.     Comments: Is able to move all extremities   Lymphadenopathy:     Cervical: No cervical adenopathy.  Skin:    General: Skin is warm and dry.  Neurological:     Mental Status: She is alert. Mental status is at baseline.  Psychiatric:         Mood and Affect: Mood normal.       ASSESSMENT/ PLAN:  TODAY   Lumbar radicular syndrome/vascular necrosis bilateral hips: is stable will continue tylenol 650 mg every 8 hours; is off gabapentin   2. Diabetic peripheral neuropathy: is stable is off gabapentin   3. CKD stage 3b due to type 2 diabetes mellitus: bun 23; creat 1.00; GFR 38 will monitor  4. Normocytic anemia: hgb 9.8 will monitor   PREVIOUS   5. Urine retention: is stable has foley will monitor   6. Chronic anxiety: is stable will continue remeron 15 mg nightly   7. Protein calorie malnutrition severe: is without change: albumin 2.1: will continue  prostat 30 mL with meals.   8. Aortic atherosclerosis: ( ct 01-26-21) will monitor   9. Central cord syndrome at C4 level of cervical spine cord subsequent encounter: cord compression; quadriplegia C1-4 incomplete: is without change in status: will continue  asa 325 mg daily; zanaflex 4 mg every 6 hours as needed   10. Restless leg syndrome: is stable will continue requip 0.25 mg nightly   11. Closed fracture of proximal end of fibula unspecified fracture morphology sequela: is stable is presently not on narcotic pain relief.   12. Type 2 diabetes mellitus with diabetic neuropathy without long term current use of insulin: hgb a1c 7.6. is off metformin. Her insulin was stopped as well. Her cbg's become elevated during the day. Will continue humalog 5  units with lunch  Will continue  lantus 10 units nightly and will monitor her status.  13. Hyperlipidemia associated with type 2 diabetes mellitus: is stable LDL 68 will continue lipitor 20 mg daily   14.  Hypertension associated with type 2 diabetes mellitus: is stable b/p 101/59 will continue hygroton 25 mg daily; is off lisinopril due to renal failure  15. GERD without esophagitis: is stable will continue prilosec 40 mg daily   16. Chronic constipation: currently not on medications will monitor     [name]  NP [facility]  Contact [phone] Monday through Friday 8am- 5pm  After hours call [phone]

## 2021-06-19 ENCOUNTER — Other Ambulatory Visit (HOSPITAL_COMMUNITY)
Admission: RE | Admit: 2021-06-19 | Discharge: 2021-06-19 | Disposition: A | Discharge status: Home / Self Care | Payer: Medicare HMO | Source: Skilled Nursing Facility | Attending: Adult Health | Admitting: Adult Health

## 2021-06-19 DIAGNOSIS — R498 Other voice and resonance disorders: Secondary | ICD-10-CM | POA: Diagnosis not present

## 2021-06-19 DIAGNOSIS — M25511 Pain in right shoulder: Secondary | ICD-10-CM | POA: Diagnosis not present

## 2021-06-19 DIAGNOSIS — I10 Essential (primary) hypertension: Secondary | ICD-10-CM | POA: Insufficient documentation

## 2021-06-19 DIAGNOSIS — N183 Chronic kidney disease, stage 3 unspecified: Secondary | ICD-10-CM | POA: Insufficient documentation

## 2021-06-19 DIAGNOSIS — R279 Unspecified lack of coordination: Secondary | ICD-10-CM | POA: Diagnosis not present

## 2021-06-19 DIAGNOSIS — R1310 Dysphagia, unspecified: Secondary | ICD-10-CM | POA: Diagnosis not present

## 2021-06-19 DIAGNOSIS — S14124D Central cord syndrome at C4 level of cervical spinal cord, subsequent encounter: Secondary | ICD-10-CM | POA: Diagnosis not present

## 2021-06-19 DIAGNOSIS — M25512 Pain in left shoulder: Secondary | ICD-10-CM | POA: Diagnosis not present

## 2021-06-19 DIAGNOSIS — M6281 Muscle weakness (generalized): Secondary | ICD-10-CM | POA: Diagnosis not present

## 2021-06-19 LAB — COMPREHENSIVE METABOLIC PANEL
ALT: 18 U/L (ref 0–44)
AST: 13 U/L — ABNORMAL LOW (ref 15–41)
Albumin: 3.1 g/dL — ABNORMAL LOW (ref 3.5–5.0)
Alkaline Phosphatase: 106 U/L (ref 38–126)
Anion gap: 10 (ref 5–15)
BUN: 29 mg/dL — ABNORMAL HIGH (ref 8–23)
CO2: 21 mmol/L — ABNORMAL LOW (ref 22–32)
Calcium: 8.7 mg/dL — ABNORMAL LOW (ref 8.9–10.3)
Chloride: 100 mmol/L (ref 98–111)
Creatinine, Ser: 1.05 mg/dL — ABNORMAL HIGH (ref 0.44–1.00)
GFR, Estimated: 53 mL/min — ABNORMAL LOW (ref >60)
Glucose, Bld: 115 mg/dL — ABNORMAL HIGH (ref 70–99)
Potassium: 4.1 mmol/L (ref 3.5–5.1)
Sodium: 131 mmol/L — ABNORMAL LOW (ref 135–145)
Total Bilirubin: 0.5 mg/dL (ref 0.3–1.2)
Total Protein: 7.1 g/dL (ref 6.5–8.1)

## 2021-06-19 LAB — CBC WITH DIFFERENTIAL/PLATELET
Abs Immature Granulocytes: 0.03 10*3/uL (ref 0.00–0.07)
Basophils Absolute: 0 10*3/uL (ref 0.0–0.1)
Basophils Relative: 0 %
Eosinophils Absolute: 0.2 10*3/uL (ref 0.0–0.5)
Eosinophils Relative: 3 %
HCT: 30.1 % — ABNORMAL LOW (ref 36.0–46.0)
Hemoglobin: 9.9 g/dL — ABNORMAL LOW (ref 12.0–15.0)
Immature Granulocytes: 0 %
Lymphocytes Relative: 32 %
Lymphs Abs: 2.4 10*3/uL (ref 0.7–4.0)
MCH: 29.6 pg (ref 26.0–34.0)
MCHC: 32.9 g/dL (ref 30.0–36.0)
MCV: 89.9 fL (ref 80.0–100.0)
Monocytes Absolute: 0.4 10*3/uL (ref 0.1–1.0)
Monocytes Relative: 6 %
Neutro Abs: 4.3 10*3/uL (ref 1.7–7.7)
Neutrophils Relative %: 59 %
Platelets: 306 10*3/uL (ref 150–400)
RBC: 3.35 MIL/uL — ABNORMAL LOW (ref 3.87–5.11)
RDW: 13.6 % (ref 11.5–15.5)
WBC: 7.3 10*3/uL (ref 4.0–10.5)
nRBC: 0 % (ref 0.0–0.2)

## 2021-06-20 DIAGNOSIS — M25511 Pain in right shoulder: Secondary | ICD-10-CM | POA: Diagnosis not present

## 2021-06-20 DIAGNOSIS — S14124D Central cord syndrome at C4 level of cervical spinal cord, subsequent encounter: Secondary | ICD-10-CM | POA: Diagnosis not present

## 2021-06-20 DIAGNOSIS — R1310 Dysphagia, unspecified: Secondary | ICD-10-CM | POA: Diagnosis not present

## 2021-06-20 DIAGNOSIS — M6281 Muscle weakness (generalized): Secondary | ICD-10-CM | POA: Diagnosis not present

## 2021-06-20 DIAGNOSIS — M25512 Pain in left shoulder: Secondary | ICD-10-CM | POA: Diagnosis not present

## 2021-06-20 DIAGNOSIS — R279 Unspecified lack of coordination: Secondary | ICD-10-CM | POA: Diagnosis not present

## 2021-06-20 DIAGNOSIS — R498 Other voice and resonance disorders: Secondary | ICD-10-CM | POA: Diagnosis not present

## 2021-06-20 LAB — MICROALBUMIN, URINE: Microalb, Ur: 85.3 ug/mL — ABNORMAL HIGH

## 2021-06-21 DIAGNOSIS — R109 Unspecified abdominal pain: Secondary | ICD-10-CM | POA: Diagnosis not present

## 2021-06-22 ENCOUNTER — Emergency Department (HOSPITAL_COMMUNITY): Payer: Medicare HMO

## 2021-06-22 ENCOUNTER — Encounter (HOSPITAL_COMMUNITY): Payer: Self-pay | Admitting: Emergency Medicine

## 2021-06-22 ENCOUNTER — Emergency Department (HOSPITAL_COMMUNITY)
Admission: EM | Admit: 2021-06-22 | Discharge: 2021-06-23 | Disposition: A | Discharge status: Home / Self Care | Payer: Medicare HMO | Attending: Emergency Medicine | Admitting: Emergency Medicine

## 2021-06-22 DIAGNOSIS — I129 Hypertensive chronic kidney disease with stage 1 through stage 4 chronic kidney disease, or unspecified chronic kidney disease: Secondary | ICD-10-CM | POA: Diagnosis not present

## 2021-06-22 DIAGNOSIS — E1122 Type 2 diabetes mellitus with diabetic chronic kidney disease: Secondary | ICD-10-CM | POA: Diagnosis not present

## 2021-06-22 DIAGNOSIS — T83018A Breakdown (mechanical) of other indwelling urethral catheter, initial encounter: Secondary | ICD-10-CM | POA: Diagnosis not present

## 2021-06-22 DIAGNOSIS — R112 Nausea with vomiting, unspecified: Secondary | ICD-10-CM

## 2021-06-22 DIAGNOSIS — Z79899 Other long term (current) drug therapy: Secondary | ICD-10-CM | POA: Diagnosis not present

## 2021-06-22 DIAGNOSIS — N183 Chronic kidney disease, stage 3 unspecified: Secondary | ICD-10-CM | POA: Insufficient documentation

## 2021-06-22 DIAGNOSIS — D631 Anemia in chronic kidney disease: Secondary | ICD-10-CM | POA: Diagnosis not present

## 2021-06-22 DIAGNOSIS — Z7982 Long term (current) use of aspirin: Secondary | ICD-10-CM | POA: Diagnosis not present

## 2021-06-22 DIAGNOSIS — N289 Disorder of kidney and ureter, unspecified: Secondary | ICD-10-CM

## 2021-06-22 DIAGNOSIS — E1140 Type 2 diabetes mellitus with diabetic neuropathy, unspecified: Secondary | ICD-10-CM | POA: Diagnosis not present

## 2021-06-22 DIAGNOSIS — R338 Other retention of urine: Secondary | ICD-10-CM

## 2021-06-22 DIAGNOSIS — Z794 Long term (current) use of insulin: Secondary | ICD-10-CM | POA: Insufficient documentation

## 2021-06-22 DIAGNOSIS — Z8616 Personal history of COVID-19: Secondary | ICD-10-CM | POA: Insufficient documentation

## 2021-06-22 DIAGNOSIS — R339 Retention of urine, unspecified: Secondary | ICD-10-CM | POA: Diagnosis not present

## 2021-06-22 DIAGNOSIS — R1084 Generalized abdominal pain: Secondary | ICD-10-CM

## 2021-06-22 DIAGNOSIS — Y846 Urinary catheterization as the cause of abnormal reaction of the patient, or of later complication, without mention of misadventure at the time of the procedure: Secondary | ICD-10-CM | POA: Insufficient documentation

## 2021-06-22 DIAGNOSIS — R1031 Right lower quadrant pain: Secondary | ICD-10-CM | POA: Diagnosis not present

## 2021-06-22 DIAGNOSIS — N179 Acute kidney failure, unspecified: Secondary | ICD-10-CM | POA: Diagnosis not present

## 2021-06-22 DIAGNOSIS — R109 Unspecified abdominal pain: Secondary | ICD-10-CM | POA: Diagnosis not present

## 2021-06-22 LAB — COMPREHENSIVE METABOLIC PANEL
ALT: 17 U/L (ref 0–44)
AST: 17 U/L (ref 15–41)
Albumin: 3.3 g/dL — ABNORMAL LOW (ref 3.5–5.0)
Alkaline Phosphatase: 106 U/L (ref 38–126)
Anion gap: 13 (ref 5–15)
BUN: 44 mg/dL — ABNORMAL HIGH (ref 8–23)
CO2: 19 mmol/L — ABNORMAL LOW (ref 22–32)
Calcium: 8.5 mg/dL — ABNORMAL LOW (ref 8.9–10.3)
Chloride: 93 mmol/L — ABNORMAL LOW (ref 98–111)
Creatinine, Ser: 1.46 mg/dL — ABNORMAL HIGH (ref 0.44–1.00)
GFR, Estimated: 36 mL/min — ABNORMAL LOW (ref >60)
Glucose, Bld: 288 mg/dL — ABNORMAL HIGH (ref 70–99)
Potassium: 3.5 mmol/L (ref 3.5–5.1)
Sodium: 125 mmol/L — ABNORMAL LOW (ref 135–145)
Total Bilirubin: 0.6 mg/dL (ref 0.3–1.2)
Total Protein: 7.6 g/dL (ref 6.5–8.1)

## 2021-06-22 LAB — CBC WITH DIFFERENTIAL/PLATELET
Abs Immature Granulocytes: 0.04 10*3/uL (ref 0.00–0.07)
Basophils Absolute: 0 10*3/uL (ref 0.0–0.1)
Basophils Relative: 0 %
Eosinophils Absolute: 0.1 10*3/uL (ref 0.0–0.5)
Eosinophils Relative: 1 %
HCT: 30.9 % — ABNORMAL LOW (ref 36.0–46.0)
Hemoglobin: 10.4 g/dL — ABNORMAL LOW (ref 12.0–15.0)
Immature Granulocytes: 0 %
Lymphocytes Relative: 20 %
Lymphs Abs: 2.3 10*3/uL (ref 0.7–4.0)
MCH: 30.1 pg (ref 26.0–34.0)
MCHC: 33.7 g/dL (ref 30.0–36.0)
MCV: 89.3 fL (ref 80.0–100.0)
Monocytes Absolute: 0.5 10*3/uL (ref 0.1–1.0)
Monocytes Relative: 4 %
Neutro Abs: 8.8 10*3/uL — ABNORMAL HIGH (ref 1.7–7.7)
Neutrophils Relative %: 75 %
Platelets: 375 10*3/uL (ref 150–400)
RBC: 3.46 MIL/uL — ABNORMAL LOW (ref 3.87–5.11)
RDW: 13.5 % (ref 11.5–15.5)
WBC: 11.8 10*3/uL — ABNORMAL HIGH (ref 4.0–10.5)
nRBC: 0 % (ref 0.0–0.2)

## 2021-06-22 LAB — LIPASE, BLOOD: Lipase: 37 U/L (ref 11–51)

## 2021-06-22 MED ORDER — LACTATED RINGERS IV BOLUS
1000.0000 mL | Freq: Once | INTRAVENOUS | Status: AC
  Administered 2021-06-22: 1000 mL via INTRAVENOUS

## 2021-06-22 MED ORDER — IOHEXOL 300 MG/ML  SOLN
100.0000 mL | Freq: Once | INTRAMUSCULAR | Status: AC | PRN
  Administered 2021-06-22: 100 mL via INTRAVENOUS

## 2021-06-22 MED ORDER — MORPHINE SULFATE (PF) 4 MG/ML IV SOLN
4.0000 mg | Freq: Once | INTRAVENOUS | Status: AC
Start: 2021-06-22 — End: 2021-06-22
  Administered 2021-06-22: 4 mg via INTRAVENOUS
  Filled 2021-06-22: qty 1

## 2021-06-22 MED ORDER — ONDANSETRON HCL 4 MG/2ML IJ SOLN
4.0000 mg | Freq: Once | INTRAMUSCULAR | Status: AC
  Administered 2021-06-22: 4 mg via INTRAVENOUS
  Filled 2021-06-22: qty 2

## 2021-06-22 NOTE — ED Triage Notes (Signed)
Pt brought over from [facility] with c/o abd pain. Per staff pt has hx of same.

## 2021-06-22 NOTE — ED Notes (Signed)
Patient transported to CT 

## 2021-06-22 NOTE — ED Notes (Signed)
Patient back from CT.

## 2021-06-22 NOTE — ED Provider Notes (Signed)
[facility] Provider Note   CSN: [id number] Arrival date & time: 06/22/21  2224     History Chief Complaint  Patient presents with   Abdominal Pain    [name] is a 85 y.o. female.  The history is provided by the patient.  Abdominal Pain She has history of diabetes, hyperlipidemia, hypertension and is transferred from a skilled nursing facility because of complaints of abdominal pain.  Patient's complaint to me is that her nerves are bad because of a fall she had over a month ago.   Past Medical History:  Diagnosis Date   DM type 2 with diabetic peripheral neuropathy ([place])    High cholesterol    Neck pain    Neuropathy     Patient Active Problem List   Diagnosis Date Noted   CKD stage 3 due to type 2 diabetes mellitus ([place]) 06/19/2021   Gastritis, acute 06/18/2021   Adult failure to thrive syndrome 06/17/2021   Left shoulder pain 06/12/2021   Major depression, recurrent, chronic ([place]) 06/02/2021   RLS (restless legs syndrome) 04/21/2021   COVID-19 with comorbid diabetes mellitus ([place]) 03/12/2021   Type 2 diabetes mellitus with stage 3 chronic kidney disease, with long-term current use of insulin ([place]) 03/12/2021   Shoulder impingement syndrome 02/18/2021   Quadriplegia, C1-C4 incomplete ([place]) 02/17/2021   Chronic anxiety 02/17/2021   Protein-calorie malnutrition, severe ([place]) 02/17/2021   Aortic atherosclerosis ([place]) 02/17/2021   DNR (do not resuscitate) 02/12/2021   Central cord syndrome at C4 level of cervical spinal cord ([place]) 02/05/2021   Urinary retention 01/31/2021   Cord compression ([place]) 01/29/2021   Acute lower UTI 01/29/2021   Hyponatremia 01/29/2021   Closed fracture of proximal end of left fibula with routine healing 12/19/2020   CKD (chronic kidney disease), stage III ([place]) 12/11/2020   Hyperlipidemia associated with type 2 diabetes mellitus ([place]) 12/05/2020   Type 2 diabetes mellitus with diabetic neuropathy, unspecified  ([place]) 12/05/2020   GERD without esophagitis 12/05/2020   Chronic constipation 12/05/2020   Diabetic peripheral neuropathy ([place]) 12/05/2020   Avascular necrosis of bones of both hips ([place]) 12/05/2020   Hypertension associated with diabetes ([place])    Acute kidney injury superimposed on CKD ([facility])    Normocytic anemia    Bilateral sensorineural hearing loss 05/02/2020   Cervical spondylosis with myelopathy 01/27/2019   Degeneration of lumbar intervertebral disc 12/12/2018    Past Surgical History:  Procedure Laterality Date   ABDOMINAL HYSTERECTOMY     ANTERIOR CERVICAL DECOMP/DISCECTOMY FUSION N/A 02/05/2021   Procedure: Cervical Three-Four  Anterior cervical decompression/discectomy/fusion;  Surgeon: Ashok Pall, MD;  Location: [facility];  Service: Neurosurgery;  Laterality: N/A;  RM 20   APPENDECTOMY     BACK SURGERY     CERVICAL DISC SURGERY     HAND SURGERY     KNEE SURGERY       OB History   No obstetric history on file.     Family History  Problem Relation Age of Onset   Cardiomyopathy Father    Cancer Mother     Social History   Tobacco Use   Smoking status: Never   Smokeless tobacco: Never  Vaping Use   Vaping Use: Never used  Substance Use Topics   Alcohol use: Never   Drug use: Never    Home Medications Prior to Admission medications   Medication Sig Start Date End Date Taking? Authorizing Provider  acetaminophen (TYLENOL) 325 MG tablet Take 650 mg by  mouth every 8 (eight) hours.    [provider]  albuterol (VENTOLIN HFA) 108 (90 Base) MCG/ACT inhaler Inhale 1 puff into the lungs every 4 (four) hours as needed for wheezing or shortness of breath.    [provider]  Amino Acids-Protein Hydrolys (FEEDING SUPPLEMENT, PRO-STAT SUGAR FREE 64,) LIQD Take 30 mLs by mouth 3 (three) times daily with meals.    [provider]  Artificial Saliva (BIOTENE MOISTURIZING MOUTH MT) Use as directed 1 application in the mouth or throat at bedtime. 9  pm    [provider]  aspirin EC 81 MG tablet Take 81 mg by mouth daily. Swallow whole.9 am    [provider]  atorvastatin (LIPITOR) 20 MG tablet Take 1 tablet (20 mg total) by mouth every evening. 12/20/20   Nyoka Cowden, Phylis Bougie, NP  Roseanne Kaufman [place] [facility]) OINT Apply topically. Apply to sacrum, coccyx, bilateral buttocks qshift for prevention.    [provider]  chlorthalidone (HYGROTON) 25 MG tablet Take 25 mg by mouth daily. 01/01/21   [provider]  colchicine 0.6 MG tablet Take 0.6 mg by mouth daily as needed.    [provider]  insulin glargine (LANTUS) 100 UNIT/ML injection Inject 20 Units into the skin at bedtime.    [provider]  insulin lispro (HUMALOG) 100 UNIT/ML KwikPen Inject 5 Units into the skin 2 (two) times daily with a meal.    [provider]  melatonin 3 MG TABS tablet Take 3 mg by mouth at bedtime.    [provider]  mirtazapine (REMERON) 15 MG tablet Take 15 mg by mouth at bedtime. 01/01/21   [provider]  NON FORMULARY Diet: NAS, Cons CHO    [provider]  omeprazole (PRILOSEC) 40 MG capsule Take 40 mg by mouth daily.    [provider]  ondansetron (ZOFRAN-ODT) 8 MG disintegrating tablet Take 8 mg by mouth. Before Meals and At Bedtime    [provider]  rOPINIRole (REQUIP) 1 MG tablet Take 1 mg by mouth at bedtime.    [provider]  Sodium Chloride 0.9 % KIT Inject into the vein. 75 cc per hour; intravenous Special Instructions: for 2 liters only for acute renal failure with n/v. Every Shift; Day, Evening, Night    [provider]  tiZANidine (ZANAFLEX) 2 MG tablet Take 2 mg by mouth every 6 (six) hours as needed for muscle spasms (for back and sciatic pain).    [provider]    Allergies    Codeine  Review of Systems   Review of Systems  Gastrointestinal:  Positive for abdominal pain.  All other systems  reviewed and are negative.  Physical Exam Updated Vital Signs BP 134/71   Pulse (!) 115   Temp 99.4 F (37.4 C) (Oral)   Resp (!) 24   Ht 5' 2"  (1.575 m)   Wt 72.3 kg   SpO2 99%   BMI 29.17 kg/m   Physical Exam Vitals and nursing note reviewed.  85 year old female, resting comfortably and in no acute distress. Vital signs are significant for elevated heart rate and respiratory rate. Oxygen saturation is 99%, which is normal. Head is normocephalic and atraumatic. PERRLA, EOMI. Oropharynx is clear. Neck is nontender and supple without adenopathy or JVD. Back is nontender and there is no CVA tenderness. Lungs are clear without rales, wheezes, or rhonchi. Chest is nontender. Heart has regular rate and rhythm without murmur. Abdomen is soft,  flat, with tenderness diffusely.  Maximum tenderness is in the right lower quadrant.  There is no rebound or guarding.  There are no masses or hepatosplenomegaly and peristalsis is slightly hypoactive. Extremities have no cyanosis or edema, full range of motion is present. Skin is warm and dry without rash. Neurologic: Awake and alert, oriented x3, cranial nerves are intact, moves all extremities equally.  ED Results / Procedures / Treatments   Labs (all labs ordered are listed, but only abnormal results are displayed) Labs Reviewed  COMPREHENSIVE METABOLIC PANEL - Abnormal; Notable for the following components:      Result Value   Sodium 125 (*)    Chloride 93 (*)    CO2 19 (*)    Glucose, Bld 288 (*)    BUN 44 (*)    Creatinine, Ser 1.46 (*)    Calcium 8.5 (*)    Albumin 3.3 (*)    GFR, Estimated 36 (*)    All other components within normal limits  CBC WITH DIFFERENTIAL/PLATELET - Abnormal; Notable for the following components:   WBC 11.8 (*)    RBC 3.46 (*)    Hemoglobin 10.4 (*)    HCT 30.9 (*)    Neutro Abs 8.8 (*)    All other components within normal limits  URINALYSIS, ROUTINE W REFLEX MICROSCOPIC - Abnormal; Notable for the  following components:   APPearance CLOUDY (*)    Hgb urine dipstick LARGE (*)    Leukocytes,Ua LARGE (*)    WBC, UA >50 (*)    Bacteria, UA FEW (*)    All other components within normal limits  LIPASE, BLOOD   Radiology CT ABDOMEN PELVIS W CONTRAST  Result Date: 06/22/2021 CLINICAL DATA:  Generalized abdominal pain. Diverticulitis suspected EXAM: CT ABDOMEN AND PELVIS WITH CONTRAST TECHNIQUE: Multidetector CT imaging of the abdomen and pelvis was performed using the standard protocol following bolus administration of intravenous contrast. CONTRAST:  157m OMNIPAQUE IOHEXOL 300 MG/ML  SOLN COMPARISON:  01/26/2021 FINDINGS: Lower chest: No acute abnormality. Hepatobiliary: Gallbladder unremarkable. 1.2 cm cyst peripherally in the liver. No biliary ductal dilatation. Pancreas: No focal abnormality or ductal dilatation. Spleen: No focal abnormality.  Normal size. Adrenals/Urinary Tract: Bilateral hydronephrosis to the level of the bladder. No obstructing stones. Urinary bladder is distended. Foley catheter in place. No renal or adrenal mass. Stomach/Bowel: Left colonic diverticulosis. No surrounding inflammation to suggest active diverticulitis. Stomach and small bowel decompressed. Vascular/Lymphatic: Aortic atherosclerosis. No evidence of aneurysm or adenopathy. Reproductive: Prior hysterectomy.  No adnexal masses. Other: No free fluid or free air. Musculoskeletal: No acute bony abnormality. IMPRESSION: Left colonic diverticulosis.  No active diverticulitis. Bilateral hydronephrosis to the level of the bladder. No obstructing stones. Urinary bladder is distended with Foley catheter in place. Aortic atherosclerosis. Electronically Signed   By: KRolm BaptiseM.D.   On: 06/22/2021 23:38    Procedures Procedures   Medications Ordered in ED Medications  lactated ringers bolus 1,000 mL (0 mLs Intravenous Stopped 06/23/21 0112)  morphine 4 MG/ML injection 4 mg (4 mg Intravenous Given 06/22/21 2346)   ondansetron (ZOFRAN) injection 4 mg (4 mg Intravenous Given 06/22/21 2346)  iohexol (OMNIPAQUE) 300 MG/ML solution 100 mL (100 mLs Intravenous Contrast Given 06/22/21 2314)  prochlorperazine (COMPAZINE) injection 10 mg (10 mg Intravenous Given 06/23/21 0115)    ED Course  I have reviewed the triage vital signs and the nursing notes.  Pertinent labs & imaging results that were available during my care of the patient were reviewed by  me and considered in my medical decision making (see chart for details).   MDM Rules/Calculators/A&P                         Abdominal pain of uncertain cause.  Differential is very broad including diverticulitis, pancreatitis, cholecystitis, appendicitis, urinary tract infection.  Differential includes conditions with significant morbidity and mortality.  Old records are reviewed, and CT of abdomen and pelvis obtained for a fall does show presence of diverticulosis.  ED work-up is initiated including screening labs and CT of abdomen and pelvis.  She is given a small dose of morphine as well as ondansetron.  CT scan shows bilateral hydronephrosis, no other acute findings.  WBC is only minimally elevated and with normal differential.  Anemia is present which is actually improved over baseline.  Renal insufficiency his present, slightly worse than on 10/20 but not significantly different from 10/16.  BUN is elevated indicating probable some degree of dehydration.  Moderate hyponatremia is present, slightly worse than on 10/20.  She has a Foley catheter which was not draining anything.  Catheter was changed and she has drained 109m.  On reexam, abdomen is still diffusely tender.  She is also continuing to complain of nausea.  She is given IV fluids and prochlorperazine.  Following above-noted treatment, she states she is feeling somewhat better although her abdomen is still sore.  Abdominal exam was repeated and tenderness is still present, but less severe.  Urinalysis  shows pyuria but only few bacteria and also yeast present.  I do not feel that she has a UTI and will withhold antibiotics at this point.  She is discharged back to her skilled nursing facility with prescriptions for a small number of hydrocodone-acetaminophen tablets, and also prescription for prochlorperazine.  Return if symptoms are getting worse.  Final Clinical Impression(s) / ED Diagnoses Final diagnoses:  Generalized abdominal pain  Nausea and vomiting, unspecified vomiting type  Renal insufficiency  Retention of urine due to occlusion of Foley catheter ([facility]    Rx / DC Orders ED Discharge Orders          Ordered    HYDROcodone-acetaminophen (NORCO) 5-325 MG tablet  Every 4 hours PRN        06/23/21 0223    prochlorperazine (COMPAZINE) 10 MG tablet  Every 6 hours PRN        06/23/21 09390            GDelora Fuel MD [id number]/09/[id number]

## 2021-06-23 ENCOUNTER — Non-Acute Institutional Stay (SKILLED_NURSING_FACILITY): Payer: Medicare HMO | Admitting: Adult Health

## 2021-06-23 ENCOUNTER — Inpatient Hospital Stay
Admission: RE | Admit: 2021-06-23 | Discharge: 2021-07-06 | Disposition: A | Discharge status: Other Acute Inpatient Hospital | Payer: Medicare HMO | Source: Ambulatory Visit | Attending: Internal Medicine | Admitting: Internal Medicine

## 2021-06-23 ENCOUNTER — Encounter: Payer: Self-pay | Admitting: Adult Health

## 2021-06-23 DIAGNOSIS — I129 Hypertensive chronic kidney disease with stage 1 through stage 4 chronic kidney disease, or unspecified chronic kidney disease: Secondary | ICD-10-CM | POA: Diagnosis not present

## 2021-06-23 DIAGNOSIS — M25512 Pain in left shoulder: Secondary | ICD-10-CM | POA: Diagnosis not present

## 2021-06-23 DIAGNOSIS — N1831 Chronic kidney disease, stage 3a: Secondary | ICD-10-CM | POA: Diagnosis not present

## 2021-06-23 DIAGNOSIS — R498 Other voice and resonance disorders: Secondary | ICD-10-CM | POA: Diagnosis not present

## 2021-06-23 DIAGNOSIS — E1122 Type 2 diabetes mellitus with diabetic chronic kidney disease: Secondary | ICD-10-CM

## 2021-06-23 DIAGNOSIS — R1310 Dysphagia, unspecified: Secondary | ICD-10-CM | POA: Diagnosis not present

## 2021-06-23 DIAGNOSIS — R112 Nausea with vomiting, unspecified: Secondary | ICD-10-CM | POA: Diagnosis not present

## 2021-06-23 DIAGNOSIS — S14124D Central cord syndrome at C4 level of cervical spinal cord, subsequent encounter: Secondary | ICD-10-CM | POA: Diagnosis not present

## 2021-06-23 DIAGNOSIS — R279 Unspecified lack of coordination: Secondary | ICD-10-CM | POA: Diagnosis not present

## 2021-06-23 DIAGNOSIS — M25511 Pain in right shoulder: Secondary | ICD-10-CM | POA: Diagnosis not present

## 2021-06-23 DIAGNOSIS — M6281 Muscle weakness (generalized): Secondary | ICD-10-CM | POA: Diagnosis not present

## 2021-06-23 LAB — URINALYSIS, ROUTINE W REFLEX MICROSCOPIC
Bilirubin Urine: NEGATIVE
Glucose, UA: NEGATIVE mg/dL
Ketones, ur: NEGATIVE mg/dL
Nitrite: NEGATIVE
Protein, ur: NEGATIVE mg/dL
Specific Gravity, Urine: 1.009 (ref 1.005–1.030)
WBC, UA: >50 WBC/hpf — ABNORMAL HIGH (ref 0–5)
pH: 7 (ref 5.0–8.0)

## 2021-06-23 MED ORDER — PROCHLORPERAZINE EDISYLATE 10 MG/2ML IJ SOLN
10.0000 mg | Freq: Once | INTRAMUSCULAR | Status: AC
  Administered 2021-06-23: 10 mg via INTRAVENOUS
  Filled 2021-06-23: qty 2

## 2021-06-23 MED ORDER — PROCHLORPERAZINE MALEATE 10 MG PO TABS
10.0000 mg | ORAL_TABLET | Freq: Four times a day (QID) | ORAL | 0 refills | Status: DC | PRN
Start: 2021-06-23 — End: 2021-06-23

## 2021-06-23 MED ORDER — HYDROCODONE-ACETAMINOPHEN 5-325 MG PO TABS: 1.0000 | ORAL_TABLET | ORAL | 0 refills | Status: DC | PRN

## 2021-06-23 MED ORDER — PROCHLORPERAZINE MALEATE 10 MG PO TABS
10.0000 mg | ORAL_TABLET | Freq: Four times a day (QID) | ORAL | 0 refills | Status: DC | PRN
Start: 2021-06-23 — End: 2021-06-27

## 2021-06-23 NOTE — Progress Notes (Signed)
Location:  [facility] Room Number: [id number] Place of Service:  SNF (31)   CODE STATUS: dnr   Allergies  Allergen Reactions   Codeine    Sulfa Antibiotics     Chief Complaint  Patient presents with   Acute Visit    Follow up ED visit     HPI:  She developed abdominal pain; severe. She did present to the ED for further evaluation. There have been no fevers. She does have nausea without vomiting. Her bladder was distended and her foley was changed. There were no indications of infection present. Today she states that her pain is improved. She states that she was able to eat cereal this AM and keep it down. She has had a large BM today.   Past Medical History:  Diagnosis Date   DM type 2 with diabetic peripheral neuropathy (HCC)    High cholesterol    Neck pain    Neuropathy     Past Surgical History:  Procedure Laterality Date   ABDOMINAL HYSTERECTOMY     ANTERIOR CERVICAL DECOMP/DISCECTOMY FUSION N/A 02/05/2021   Procedure: Cervical Three-Four  Anterior cervical decompression/discectomy/fusion;  Surgeon: Ashok Pall, MD;  Location: [facility];  Service: Neurosurgery;  Laterality: N/A;  RM 20   APPENDECTOMY     BACK SURGERY     CERVICAL DISC SURGERY     HAND SURGERY     KNEE SURGERY      Social History   Socioeconomic History   Marital status: Widowed    Spouse name: Not on file   Number of children: Not on file   Years of education: Not on file   Highest education level: Not on file  Occupational History   Not on file  Tobacco Use   Smoking status: Never   Smokeless tobacco: Never  Vaping Use   Vaping Use: Never used  Substance and Sexual Activity   Alcohol use: Never   Drug use: Never   Sexual activity: Never  Other Topics Concern   Not on file  Social History Narrative   Not on file   Social Determinants of Health   Financial Resource Strain: Not on file  Food Insecurity: Not on file  Transportation Needs: Not on file  Physical  Activity: Not on file  Stress: Not on file  Social Connections: Not on file  Intimate Partner Violence: Not on file   Family History  Problem Relation Age of Onset   Cardiomyopathy Father    Cancer Mother       VITAL SIGNS BP (!) 180/80   Pulse 82   Temp 98.2 F (36.8 C)   Ht 5' 2"  (1.575 m)   Wt 159 lb 8 oz (72.3 kg)   BMI 29.17 kg/m   Outpatient Encounter Medications as of 06/23/2021  Medication Sig   acetaminophen (TYLENOL) 325 MG tablet Take 650 mg by mouth every 8 (eight) hours.   albuterol (VENTOLIN HFA) 108 (90 Base) MCG/ACT inhaler Inhale 1 puff into the lungs every 4 (four) hours as needed for wheezing or shortness of breath.   Amino Acids-Protein Hydrolys (FEEDING SUPPLEMENT, PRO-STAT SUGAR FREE 64,) LIQD Take 30 mLs by mouth 3 (three) times daily with meals.   Artificial Saliva (BIOTENE MOISTURIZING MOUTH MT) Use as directed 1 application in the mouth or throat at bedtime. 9 pm   aspirin EC 81 MG tablet Take 81 mg by mouth daily. Swallow whole.9 am   atorvastatin (LIPITOR) 20 MG tablet Take 1  tablet (20 mg total) by mouth every evening.   Balsam [place]-Castor Oil (VENELEX) OINT Apply topically. Apply to sacrum, coccyx, bilateral buttocks qshift for prevention.   chlorthalidone (HYGROTON) 25 MG tablet Take 25 mg by mouth daily.   colchicine 0.6 MG tablet Take 0.6 mg by mouth daily as needed.   HYDROcodone-acetaminophen (NORCO) 5-325 MG tablet Take 1 tablet by mouth every 4 (four) hours as needed for moderate pain.   insulin glargine (LANTUS) 100 UNIT/ML injection Inject 20 Units into the skin at bedtime.   insulin lispro (HUMALOG) 100 UNIT/ML KwikPen Inject 5 Units into the skin 2 (two) times daily with a meal.   melatonin 3 MG TABS tablet Take 3 mg by mouth at bedtime.   mirtazapine (REMERON) 15 MG tablet Take 15 mg by mouth at bedtime.   NON FORMULARY Diet: NAS, Cons CHO   omeprazole (PRILOSEC) 40 MG capsule Take 40 mg by mouth daily.   ondansetron (ZOFRAN-ODT) 8 MG  disintegrating tablet Take 8 mg by mouth. Before Meals and At Bedtime   prochlorperazine (COMPAZINE) 10 MG tablet Take 1 tablet (10 mg total) by mouth every 6 (six) hours as needed for nausea or vomiting.   rOPINIRole (REQUIP) 1 MG tablet Take 1 mg by mouth at bedtime.   Sodium Chloride 0.9 % KIT Inject into the vein. 75 cc per hour; intravenous Special Instructions: for 2 liters only for acute renal failure with n/v. Every Shift; Day, Evening, Night   tiZANidine (ZANAFLEX) 2 MG tablet Take 2 mg by mouth every 6 (six) hours as needed for muscle spasms (for back and sciatic pain).   No facility-administered encounter medications on file as of 06/23/2021.     SIGNIFICANT DIAGNOSTIC EXAMS  PREVIOUS   12-02-20; ct of head: No acute intracranial abnormality. Polypoid sinus disease.  12-02-20: ct of cervical spine:  Postoperative and degenerative changes in the cervical spine. No acute bony abnormality.  12-02-20; ct of pelvis:  1. No evidence of acute fracture or dislocation of the pelvis or hips. 2. 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 3. Degenerative changes in the hips with subcortical cysts on the left hip.  12-02-20: ct of left knee:  1. Subtle acute nondisplaced fracture through the medial aspect of the head of the fibula. 2. 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 3. There is a small joint effusion.  12-02-20: mri left hip:  1. No acute findings involving the left hip. 2. Mild chronic bilateral hip avascular necrosis. Moderate degenerative hip findings bilaterally. No hip effusion or regional bursitis. 3. Mild left greater than right hamstring tendinopathy. 4. 1.3 cm left eccentric Bartholin's cyst or Gartner cyst.  12-02-20: mri lumbar spine:  1. 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 2. L3-4: Bilateral posterolateral disc bulges, more prominent on the left. Mild left foraminal encroachment  that could possibly affect the left L3 nerve. Moderate to severe bilateral facet osteoarthritis. These findings could relate to back pain or referred facet syndrome pain. 3. L4-5: Bilateral facet arthropathy with 2 mm of anterolisthesis. Bulging of the disc more prominent towards the right. Mild narrowing of the lateral recesses and of the intervertebral foramen on the right, but without visible neural compression. Findings at this level could relate to back pain or referred facet syndrome pain. 4. L5-S1: Previous left hemilaminectomy. Endplate osteophytes and bulging of the disc more prominent towards the left. Facet degeneration and hypertrophy left more than right with left foraminal stenosis and subarticular lateral recess  stenosis could cause left-sided neural compression.   01-25-21: ct of head; maxillofacial cervical spine:  1. No acute intracranial pathology. 2. No acute/traumatic cervical spine pathology. 3. No acute facial bone fractures.  01-26-21: ct of chest abdomen and pelvis:  1. No noncontrast CT evidence of acute traumatic injury to the chest, abdomen, or pelvis. 2. Distended urinary bladder. Mild bilateral hydronephrosis and hydroureter to the bilateral ureterovesicular junctions. No obstructing calculus or other etiology identified. Correlate for urinary retention. 3. No fracture or dislocation of the lumbar spine. Moderate disc space height loss and osteophytosis at L5-S1. Probable small broad-based posterior disc bulges at L4-L5 and L5-S1. 4. Coronary artery disease. Aortic Atherosclerosis  01-26-21: ct of right shoulder:  1.  No acute osseous injury of the right shoulder. 2. Mild mineralization in the infraspinatus tendon as can be seen with calcific tendinosis.  01-29-21: MRI of spine:  Postoperative and multilevel degenerative changes of the cervical spine. There is severe canal stenosis with cord compression at C3-C4. Abnormal cord signal is present just below this level (at  operative levels) and may reflect edema or myelomalacia No significant degenerative changes of the thoracic spine. Multilevel degenerative changes of lumbar spine similar to recent prior study.  01-30-21: renal ultrasound:  1. No acute abnormality. Small amount of right perinephric fluid, nonspecific.  02-05-21: chest x-ray:  1. Right internal jugular central venous catheter with tip over the mid SVC. No pneumothorax. 2. Hazy lung base opacities likely represent combination of atelectasis and pleural fluid. Vascular congestion.   03-06-21 chest x-ray: left lower lobe infiltrate likely representing pneumonia   03-26-21: lumbar x-ray: mild to moderate degenerative changes in lumbar spine  03-27-21; DEXA scan: t score -1.777  TODAY  06-22-21: ct of abdomen and pelvis:  Left colonic diverticulosis.  No active diverticulitis. Bilateral hydronephrosis to the level of the bladder. No obstructing stones. Urinary bladder is distended with Foley catheter in place. Aortic atherosclerosis.    LABS REVIEWED PREVIOUS   12-02-20: wbc 6.4; hgb 11.3; hct 34.4; mcv 93.0 plt 216; glucose 209; bun 33; creat 1.13; k+ 4.3; na++ 137; ca 9.0; GFR 49; hgb a1c 8.6 12-03-20: wbc 6.5; hgb 9.8; hct 30.5 mcv 93.8 plt 182; glucose 188;bun 41; creat 1.49; k+ 4.4; na++ 134; ca 8.2; GFR 35 12-04-20; glucose 285; bun 29; creat 1.00; k+ 4.3; na++ 138; ca 8.7 GFR 56  01-25-21: wbc 6.4; hgb 10.6; hct 31.7; mcv 91.1 plt 227; glucose 152; bun 40; creat 1.2 k+ 4.2; na++ 135; ca 8.7; GFR 42; liver normal albumin 3.5 01-29-21: urine and blood culture: klebsiella pneumoniae 01-31-21: wbc 13.1; hgb 8.3; hct 23.6; mcv 88.1 plt 153; glucose 162; bun 67; creat 2.77; k+ 4.8; na++ 125; ca 7.5; GFR 17; ast 62; alt 57; albumin 1.7 02-04-21: wbc 9.8; hgb 7.6; hct 22.0; mcv 85.3 plt 212; glucose 161 bun 40; creat 1.40; k+ 3.4; na++ 127; ca 7.5; GFR 38; ast 62; alt 57; albumin 1.7  02-08-21: wbc 10.4; hgb 9.2; hct 27.6; mcv 87.3 plt 433; glucose 186; bun 23;  creat 1.00 ;k+ 4.4; na++ 131; ca 8.1; GFR 56; ast 60; alt 61; albumin 2.1  02-20-21: hgb 9.4; hct 29.9 glucose 154; bun 51; creat 1.17; k+ 4.5; na++ 136; ca 8.9; GFR 47 02-24-21: glucose 175; bun 62; creat 1.13; k+ 4.5; na++ 133; ca 9.0 GFR 49 03-06-21: wbc 5.3; hgb 8.8; hct 27.1; mcv 93.4 plt 262; glucose 453; bun 66; creat 1.30; k+ 4.6; na++ 135; ca 9.0; GFR 41; d-dimer:  2.16; CRP 3.0  03-13-21: d-dimer: 1.49 03-20-21: hep C nr; d-dimer 0.86 03-27-21: d-dimer 0.52 03-31-21: wbc 5.5; hgb 8.5; hct 26.8; mcv 93.4 plt 203; hgb a1c 7.6; chol 130; ldl 68; trig 138; hdl 34; urine micro-albumin 25.1 06-12-21: uric acid 5.9 06-15-21: wbc 6.0; hgb 9.8; hct 29.4; mcv 90,7 plt 242; glucose 187; bun 65; creat 1.33; k+ 4.2; na++ 131; ca 8.9; GFR 40; liver normal albumin 3.3   TODAY  06-19-21: wbc 7.3; hgb 9.9; hct 30.1; mcv 89,9 plt 306; glucose 115; bun 29; creat 1.05; k+ 4.1; na++ 131; ca 8.7; GFR 53; liver normal albumin 3.1; urine micro-albumin 85.3 06-22-21; wbc 11.8; hgb 10.4; hct 30.9; mcv 89,3 plt 375; glucose 288; bun 44; creat 1.46; k+ 3.5; na++ 125; ca 8.3 GFR 36; liver normal albumin 3.3; lipase 37   Review of Systems  Constitutional:  Negative for malaise/fatigue.  Respiratory:  Negative for cough and shortness of breath.   Cardiovascular:  Negative for chest pain, palpitations and leg swelling.  Gastrointestinal:  Positive for abdominal pain and nausea. Negative for constipation, diarrhea, heartburn and vomiting.  Musculoskeletal:  Negative for back pain, joint pain and myalgias.  Skin: Negative.   Neurological:  Negative for dizziness.  Psychiatric/Behavioral:  The patient is not nervous/anxious.    Physical Exam Constitutional:      General: She is not in acute distress.    Appearance: She is well-developed. She is not diaphoretic.  Neck:     Thyroid: No thyromegaly.  Cardiovascular:     Rate and Rhythm: Normal rate and regular rhythm.     Heart sounds: Normal heart sounds.  Pulmonary:      Effort: Pulmonary effort is normal. No respiratory distress.     Breath sounds: Normal breath sounds.  Abdominal:     General: Bowel sounds are normal. There is no distension.     Palpations: Abdomen is soft.     Tenderness: There is abdominal tenderness.     Comments: Mild tenderness to palpation throughout  Genitourinary:    Comments: Foley  Musculoskeletal:        General: Normal range of motion.     Cervical back: Neck supple.     Right lower leg: No edema.     Left lower leg: No edema.  Lymphadenopathy:     Cervical: No cervical adenopathy.  Skin:    General: Skin is warm and dry.  Neurological:     Mental Status: She is alert. Mental status is at baseline.  Psychiatric:        Mood and Affect: Mood normal.     ASSESSMENT/ PLAN:  TODAY  Nausea with vomiting; unspecified vomiting type Hypertension associated with stage 3a chronic kidney disease due to type 2 diabetes mellitus  For her hypertension with readings not well controlled; will begin lisinopril 2.5 mg daily and will repeat BMP on 07-10-21.  Will continue to monitor her status.    [name] NP [facility]  Contact [phone] Monday through Friday 8am- 5pm  After hours call [phone]

## 2021-06-23 NOTE — ED Notes (Signed)
Urinary catheter no longer draining urine at this time, lower abdomen is distended and pt co abdominal discomfort. After chart review the last foley catheter insertion was in June 2022. Foley catheter changed out.

## 2021-06-23 NOTE — Discharge Instructions (Signed)
Return if symptoms are getting worse. °

## 2021-06-24 DIAGNOSIS — R112 Nausea with vomiting, unspecified: Secondary | ICD-10-CM | POA: Insufficient documentation

## 2021-06-24 DIAGNOSIS — R1310 Dysphagia, unspecified: Secondary | ICD-10-CM | POA: Diagnosis not present

## 2021-06-24 DIAGNOSIS — R110 Nausea: Secondary | ICD-10-CM | POA: Insufficient documentation

## 2021-06-24 DIAGNOSIS — R279 Unspecified lack of coordination: Secondary | ICD-10-CM | POA: Diagnosis not present

## 2021-06-24 DIAGNOSIS — R498 Other voice and resonance disorders: Secondary | ICD-10-CM | POA: Diagnosis not present

## 2021-06-24 DIAGNOSIS — M25512 Pain in left shoulder: Secondary | ICD-10-CM | POA: Diagnosis not present

## 2021-06-24 DIAGNOSIS — N1831 Chronic kidney disease, stage 3a: Secondary | ICD-10-CM | POA: Insufficient documentation

## 2021-06-24 DIAGNOSIS — M25511 Pain in right shoulder: Secondary | ICD-10-CM | POA: Diagnosis not present

## 2021-06-24 DIAGNOSIS — M6281 Muscle weakness (generalized): Secondary | ICD-10-CM | POA: Diagnosis not present

## 2021-06-24 DIAGNOSIS — I129 Hypertensive chronic kidney disease with stage 1 through stage 4 chronic kidney disease, or unspecified chronic kidney disease: Secondary | ICD-10-CM | POA: Insufficient documentation

## 2021-06-24 DIAGNOSIS — S14124D Central cord syndrome at C4 level of cervical spinal cord, subsequent encounter: Secondary | ICD-10-CM | POA: Diagnosis not present

## 2021-06-25 DIAGNOSIS — M25512 Pain in left shoulder: Secondary | ICD-10-CM | POA: Diagnosis not present

## 2021-06-25 DIAGNOSIS — M6281 Muscle weakness (generalized): Secondary | ICD-10-CM | POA: Diagnosis not present

## 2021-06-25 DIAGNOSIS — G8252 Quadriplegia, C1-C4 incomplete: Secondary | ICD-10-CM | POA: Diagnosis not present

## 2021-06-25 DIAGNOSIS — M25511 Pain in right shoulder: Secondary | ICD-10-CM | POA: Diagnosis not present

## 2021-06-25 DIAGNOSIS — R279 Unspecified lack of coordination: Secondary | ICD-10-CM | POA: Diagnosis not present

## 2021-06-25 DIAGNOSIS — S14124D Central cord syndrome at C4 level of cervical spinal cord, subsequent encounter: Secondary | ICD-10-CM | POA: Diagnosis not present

## 2021-06-25 DIAGNOSIS — R1310 Dysphagia, unspecified: Secondary | ICD-10-CM | POA: Diagnosis not present

## 2021-06-25 DIAGNOSIS — Z1159 Encounter for screening for other viral diseases: Secondary | ICD-10-CM | POA: Diagnosis not present

## 2021-06-25 DIAGNOSIS — R498 Other voice and resonance disorders: Secondary | ICD-10-CM | POA: Diagnosis not present

## 2021-06-26 ENCOUNTER — Non-Acute Institutional Stay (SKILLED_NURSING_FACILITY): Payer: Medicare HMO | Admitting: Adult Health

## 2021-06-26 ENCOUNTER — Encounter: Payer: Self-pay | Admitting: Adult Health

## 2021-06-26 DIAGNOSIS — R279 Unspecified lack of coordination: Secondary | ICD-10-CM | POA: Diagnosis not present

## 2021-06-26 DIAGNOSIS — R110 Nausea: Secondary | ICD-10-CM | POA: Diagnosis not present

## 2021-06-26 DIAGNOSIS — M25511 Pain in right shoulder: Secondary | ICD-10-CM | POA: Diagnosis not present

## 2021-06-26 DIAGNOSIS — G8252 Quadriplegia, C1-C4 incomplete: Secondary | ICD-10-CM

## 2021-06-26 DIAGNOSIS — R1310 Dysphagia, unspecified: Secondary | ICD-10-CM | POA: Diagnosis not present

## 2021-06-26 DIAGNOSIS — K2900 Acute gastritis without bleeding: Secondary | ICD-10-CM

## 2021-06-26 DIAGNOSIS — M25512 Pain in left shoulder: Secondary | ICD-10-CM | POA: Diagnosis not present

## 2021-06-26 DIAGNOSIS — R498 Other voice and resonance disorders: Secondary | ICD-10-CM | POA: Diagnosis not present

## 2021-06-26 DIAGNOSIS — S14124D Central cord syndrome at C4 level of cervical spinal cord, subsequent encounter: Secondary | ICD-10-CM | POA: Diagnosis not present

## 2021-06-26 DIAGNOSIS — M6281 Muscle weakness (generalized): Secondary | ICD-10-CM | POA: Diagnosis not present

## 2021-06-26 NOTE — Progress Notes (Signed)
Location:  [facility] Room Number: 154 Place of Service:  SNF (31)   CODE STATUS: dnr   Allergies  Allergen Reactions   Codeine    Sulfa Antibiotics     Chief Complaint  Patient presents with   Acute Visit    Abdominal pain     HPI:  She continues to have abdominal pain with nausea. She is able to eat light food such as soups without vomiting. She continues to have nausea without vomiting present. She does not have diarrhea or constipation present. She states that she has a "nervous" stomach.  She is complaining of taking "too many" pills. She has been declining to take the zofran. Does not like it.  There have been no fevers present.   Past Medical History:  Diagnosis Date   DM type 2 with diabetic peripheral neuropathy (HCC)    High cholesterol    Neck pain    Neuropathy     Past Surgical History:  Procedure Laterality Date   ABDOMINAL HYSTERECTOMY     ANTERIOR CERVICAL DECOMP/DISCECTOMY FUSION N/A 02/05/2021   Procedure: Cervical Three-Four  Anterior cervical decompression/discectomy/fusion;  Surgeon: Ashok Pall, MD;  Location: [facility];  Service: Neurosurgery;  Laterality: N/A;  RM 20   APPENDECTOMY     BACK SURGERY     CERVICAL DISC SURGERY     HAND SURGERY     KNEE SURGERY      Social History   Socioeconomic History   Marital status: Widowed    Spouse name: Not on file   Number of children: Not on file   Years of education: Not on file   Highest education level: Not on file  Occupational History   Not on file  Tobacco Use   Smoking status: Never   Smokeless tobacco: Never  Vaping Use   Vaping Use: Never used  Substance and Sexual Activity   Alcohol use: Never   Drug use: Never   Sexual activity: Never  Other Topics Concern   Not on file  Social History Narrative   Not on file   Social Determinants of Health   Financial Resource Strain: Not on file  Food Insecurity: Not on file  Transportation Needs: Not on file   Physical Activity: Not on file  Stress: Not on file  Social Connections: Not on file  Intimate Partner Violence: Not on file   Family History  Problem Relation Age of Onset   Cardiomyopathy Father    Cancer Mother       VITAL SIGNS BP (!) 141/86   Pulse 82   Temp 97.8 F (36.6 C)   Ht _0  (1.575 m)   Wt 159 lb 8 oz (72.3 kg)   BMI 29.17 kg/m   Outpatient Encounter Medications as of 06/26/2021  Medication Sig   acetaminophen (TYLENOL) 325 MG tablet Take 650 mg by mouth every 8 (eight) hours.   albuterol (VENTOLIN HFA) 108 (90 Base) MCG/ACT inhaler Inhale 1 puff into the lungs every 4 (four) hours as needed for wheezing or shortness of breath.   Amino Acids-Protein Hydrolys (FEEDING SUPPLEMENT, PRO-STAT SUGAR FREE 64,) LIQD Take 30 mLs by mouth 3 (three) times daily with meals.   Artificial Saliva (BIOTENE MOISTURIZING MOUTH MT) Use as directed 1 application in the mouth or throat at bedtime. 9 pm   aspirin EC 81 MG tablet Take 81 mg by mouth daily. Swallow whole.9 am   atorvastatin (LIPITOR) 20 MG tablet Take 1 tablet (  20 mg total) by mouth every evening.   Balsam [place]-Castor Oil (VENELEX) OINT Apply topically. Apply to sacrum, coccyx, bilateral buttocks qshift for prevention.   chlorthalidone (HYGROTON) 25 MG tablet Take 25 mg by mouth daily.   colchicine 0.6 MG tablet Take 0.6 mg by mouth daily as needed.   HYDROcodone-acetaminophen (NORCO) 5-325 MG tablet Take 1 tablet by mouth every 4 (four) hours as needed for moderate pain.   insulin glargine (LANTUS) 100 UNIT/ML injection Inject 20 Units into the skin at bedtime.   insulin lispro (HUMALOG) 100 UNIT/ML KwikPen Inject 5 Units into the skin 2 (two) times daily with a meal.   melatonin 3 MG TABS tablet Take 3 mg by mouth at bedtime.   mirtazapine (REMERON) 15 MG tablet Take 15 mg by mouth at bedtime.   NON FORMULARY Diet: NAS, Cons CHO   omeprazole (PRILOSEC) 40 MG capsule Take 40 mg by mouth daily.   ondansetron  (ZOFRAN-ODT) 8 MG disintegrating tablet Take 8 mg by mouth. Before Meals and At Bedtime   prochlorperazine (COMPAZINE) 10 MG tablet Take 1 tablet (10 mg total) by mouth every 6 (six) hours as needed for nausea or vomiting.   rOPINIRole (REQUIP) 1 MG tablet Take 1 mg by mouth at bedtime.   Sodium Chloride 0.9 % KIT Inject into the vein. 75 cc per hour; intravenous Special Instructions: for 2 liters only for acute renal failure with n/v. Every Shift; Day, Evening, Night   tiZANidine (ZANAFLEX) 2 MG tablet Take 2 mg by mouth every 6 (six) hours as needed for muscle spasms (for back and sciatic pain).   No facility-administered encounter medications on file as of 06/26/2021.     SIGNIFICANT DIAGNOSTIC EXAMS  PREVIOUS   12-02-20; ct of head: No acute intracranial abnormality. Polypoid sinus disease.  12-02-20: ct of cervical spine:  Postoperative and degenerative changes in the cervical spine. No acute bony abnormality.  12-02-20; ct of pelvis:  1. No evidence of acute fracture or dislocation of the pelvis or hips. 2. 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 3. Degenerative changes in the hips with subcortical cysts on the left hip.  12-02-20: ct of left knee:  1. Subtle acute nondisplaced fracture through the medial aspect of the head of the fibula. 2. 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 3. There is a small joint effusion.  12-02-20: mri left hip:  1. No acute findings involving the left hip. 2. Mild chronic bilateral hip avascular necrosis. Moderate degenerative hip findings bilaterally. No hip effusion or regional bursitis. 3. Mild left greater than right hamstring tendinopathy. 4. 1.3 cm left eccentric Bartholin's cyst or Gartner cyst.  12-02-20: mri lumbar spine:  1. 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 2. L3-4: Bilateral posterolateral disc bulges, more prominent on the left. Mild left  foraminal encroachment that could possibly affect the left L3 nerve. Moderate to severe bilateral facet osteoarthritis. These findings could relate to back pain or referred facet syndrome pain. 3. L4-5: Bilateral facet arthropathy with 2 mm of anterolisthesis. Bulging of the disc more prominent towards the right. Mild narrowing of the lateral recesses and of the intervertebral foramen on the right, but without visible neural compression. Findings at this level could relate to back pain or referred facet syndrome pain. 4. L5-S1: Previous left hemilaminectomy. Endplate osteophytes and bulging of the disc more prominent towards the left. Facet degeneration and hypertrophy left more than right with left foraminal stenosis and subarticular lateral recess stenosis  could cause left-sided neural compression.   01-25-21: ct of head; maxillofacial cervical spine:  1. No acute intracranial pathology. 2. No acute/traumatic cervical spine pathology. 3. No acute facial bone fractures.  01-26-21: ct of chest abdomen and pelvis:  1. No noncontrast CT evidence of acute traumatic injury to the chest, abdomen, or pelvis. 2. Distended urinary bladder. Mild bilateral hydronephrosis and hydroureter to the bilateral ureterovesicular junctions. No obstructing calculus or other etiology identified. Correlate for urinary retention. 3. No fracture or dislocation of the lumbar spine. Moderate disc space height loss and osteophytosis at L5-S1. Probable small broad-based posterior disc bulges at L4-L5 and L5-S1. 4. Coronary artery disease. Aortic Atherosclerosis  01-26-21: ct of right shoulder:  1.  No acute osseous injury of the right shoulder. 2. Mild mineralization in the infraspinatus tendon as can be seen with calcific tendinosis.  01-29-21: MRI of spine:  Postoperative and multilevel degenerative changes of the cervical spine. There is severe canal stenosis with cord compression at C3-C4. Abnormal cord signal is present  just below this level (at operative levels) and may reflect edema or myelomalacia No significant degenerative changes of the thoracic spine. Multilevel degenerative changes of lumbar spine similar to recent prior study.  01-30-21: renal ultrasound:  1. No acute abnormality. Small amount of right perinephric fluid, nonspecific.  02-05-21: chest x-ray:  1. Right internal jugular central venous catheter with tip over the mid SVC. No pneumothorax. 2. Hazy lung base opacities likely represent combination of atelectasis and pleural fluid. Vascular congestion.   03-06-21 chest x-ray: left lower lobe infiltrate likely representing pneumonia   03-26-21: lumbar x-ray: mild to moderate degenerative changes in lumbar spine  03-27-21; DEXA scan: t score -1.777  06-22-21: ct of abdomen and pelvis:  Left colonic diverticulosis.  No active diverticulitis. Bilateral hydronephrosis to the level of the bladder. No obstructing stones. Urinary bladder is distended with Foley catheter in place. Aortic atherosclerosis.  NO NEW EXAMS.     LABS REVIEWED PREVIOUS   12-02-20: wbc 6.4; hgb 11.3; hct 34.4; mcv 93.0 plt 216; glucose 209; bun 33; creat 1.13; k+ 4.3; na++ 137; ca 9.0; GFR 49; hgb a1c 8.6 12-03-20: wbc 6.5; hgb 9.8; hct 30.5 mcv 93.8 plt 182; glucose 188;bun 41; creat 1.49; k+ 4.4; na++ 134; ca 8.2; GFR 35 12-04-20; glucose 285; bun 29; creat 1.00; k+ 4.3; na++ 138; ca 8.7 GFR 56  01-25-21: wbc 6.4; hgb 10.6; hct 31.7; mcv 91.1 plt 227; glucose 152; bun 40; creat 1.2 k+ 4.2; na++ 135; ca 8.7; GFR 42; liver normal albumin 3.5 01-29-21: urine and blood culture: klebsiella pneumoniae 01-31-21: wbc 13.1; hgb 8.3; hct 23.6; mcv 88.1 plt 153; glucose 162; bun 67; creat 2.77; k+ 4.8; na++ 125; ca 7.5; GFR 17; ast 62; alt 57; albumin 1.7 02-04-21: wbc 9.8; hgb 7.6; hct 22.0; mcv 85.3 plt 212; glucose 161 bun 40; creat 1.40; k+ 3.4; na++ 127; ca 7.5; GFR 38; ast 62; alt 57; albumin 1.7  02-08-21: wbc 10.4; hgb 9.2; hct 27.6; mcv  87.3 plt 433; glucose 186; bun 23; creat 1.00 ;k+ 4.4; na++ 131; ca 8.1; GFR 56; ast 60; alt 61; albumin 2.1  02-20-21: hgb 9.4; hct 29.9 glucose 154; bun 51; creat 1.17; k+ 4.5; na++ 136; ca 8.9; GFR 47 02-24-21: glucose 175; bun 62; creat 1.13; k+ 4.5; na++ 133; ca 9.0 GFR 49 03-06-21: wbc 5.3; hgb 8.8; hct 27.1; mcv 93.4 plt 262; glucose 453; bun 66; creat 1.30; k+ 4.6; na++ 135; ca 9.0; GFR  41; d-dimer: 2.16; CRP 3.0  03-13-21: d-dimer: 1.49 03-20-21: hep C nr; d-dimer 0.86 03-27-21: d-dimer 0.52 03-31-21: wbc 5.5; hgb 8.5; hct 26.8; mcv 93.4 plt 203; hgb a1c 7.6; chol 130; ldl 68; trig 138; hdl 34; urine micro-albumin 25.1 06-12-21: uric acid 5.9 06-15-21: wbc 6.0; hgb 9.8; hct 29.4; mcv 90,7 plt 242; glucose 187; bun 65; creat 1.33; k+ 4.2; na++ 131; ca 8.9; GFR 40; liver normal albumin 3.3  06-19-21: wbc 7.3; hgb 9.9; hct 30.1; mcv 89,9 plt 306; glucose 115; bun 29; creat 1.05; k+ 4.1; na++ 131; ca 8.7; GFR 53; liver normal albumin 3.1; urine micro-albumin 85.3 06-22-21; wbc 11.8; hgb 10.4; hct 30.9; mcv 89,3 plt 375; glucose 288; bun 44; creat 1.46; k+ 3.5; na++ 125; ca 8.3 GFR 36; liver normal albumin 3.3; lipase 37   NO NEW LABS.   Review of Systems  Constitutional:  Negative for malaise/fatigue.  Respiratory:  Negative for cough and shortness of breath.   Cardiovascular:  Negative for chest pain, palpitations and leg swelling.  Gastrointestinal:  Positive for nausea. Negative for abdominal pain, constipation, diarrhea, heartburn and vomiting.  Musculoskeletal:  Negative for back pain, joint pain and myalgias.  Skin: Negative.   Neurological:  Negative for dizziness.  Psychiatric/Behavioral:  The patient is not nervous/anxious.    Physical Exam Constitutional:      General: She is not in acute distress.    Appearance: She is well-developed. She is not diaphoretic.  Neck:     Thyroid: No thyromegaly.  Cardiovascular:     Rate and Rhythm: Normal rate and regular rhythm.     Heart  sounds: Normal heart sounds.  Pulmonary:     Effort: Pulmonary effort is normal. No respiratory distress.     Breath sounds: Normal breath sounds.  Abdominal:     General: Bowel sounds are normal. There is distension.     Palpations: Abdomen is soft.     Tenderness: There is abdominal tenderness.  Genitourinary:    Comments: Foley  Musculoskeletal:        General: Normal range of motion.     Cervical back: Neck supple.     Right lower leg: No edema.     Left lower leg: No edema.  Lymphadenopathy:     Cervical: No cervical adenopathy.  Skin:    General: Skin is warm and dry.  Neurological:     Mental Status: She is alert and oriented to person, place, and time.      ASSESSMENT/ PLAN:  TODAY  Nausea in adult Acute gastritis  Will stop zofran and compazine  Will begin reglan 2.5 mg AC/HS Will lower tylenol to 650 mg twice daily to help reduce pill burden    [name] NP [facility]  Contact [phone] Monday through Friday 8am- 5pm  After hours call [phone]

## 2021-06-27 ENCOUNTER — Non-Acute Institutional Stay: Payer: Self-pay | Admitting: Adult Health

## 2021-06-27 ENCOUNTER — Encounter: Payer: Self-pay | Admitting: Adult Health

## 2021-06-27 ENCOUNTER — Non-Acute Institutional Stay (SKILLED_NURSING_FACILITY): Payer: Medicare HMO | Admitting: Adult Health

## 2021-06-27 DIAGNOSIS — G8252 Quadriplegia, C1-C4 incomplete: Secondary | ICD-10-CM

## 2021-06-27 DIAGNOSIS — N1831 Chronic kidney disease, stage 3a: Secondary | ICD-10-CM

## 2021-06-27 DIAGNOSIS — M25511 Pain in right shoulder: Secondary | ICD-10-CM | POA: Diagnosis not present

## 2021-06-27 DIAGNOSIS — F339 Major depressive disorder, recurrent, unspecified: Secondary | ICD-10-CM

## 2021-06-27 DIAGNOSIS — M25512 Pain in left shoulder: Secondary | ICD-10-CM | POA: Diagnosis not present

## 2021-06-27 DIAGNOSIS — I700 Atherosclerosis of aorta: Secondary | ICD-10-CM

## 2021-06-27 DIAGNOSIS — E1122 Type 2 diabetes mellitus with diabetic chronic kidney disease: Secondary | ICD-10-CM

## 2021-06-27 DIAGNOSIS — R498 Other voice and resonance disorders: Secondary | ICD-10-CM | POA: Diagnosis not present

## 2021-06-27 DIAGNOSIS — I129 Hypertensive chronic kidney disease with stage 1 through stage 4 chronic kidney disease, or unspecified chronic kidney disease: Secondary | ICD-10-CM

## 2021-06-27 DIAGNOSIS — R1310 Dysphagia, unspecified: Secondary | ICD-10-CM | POA: Diagnosis not present

## 2021-06-27 DIAGNOSIS — S14124S Central cord syndrome at C4 level of cervical spinal cord, sequela: Secondary | ICD-10-CM

## 2021-06-27 DIAGNOSIS — M6281 Muscle weakness (generalized): Secondary | ICD-10-CM | POA: Diagnosis not present

## 2021-06-27 DIAGNOSIS — S14124D Central cord syndrome at C4 level of cervical spinal cord, subsequent encounter: Secondary | ICD-10-CM | POA: Diagnosis not present

## 2021-06-27 DIAGNOSIS — R279 Unspecified lack of coordination: Secondary | ICD-10-CM | POA: Diagnosis not present

## 2021-06-27 NOTE — Addendum Note (Signed)
Addended by: Gerlene Fee on: 06/27/2021 02:26 PM   Modules accepted: Orders, Level of Service, SmartSet

## 2021-06-27 NOTE — Progress Notes (Signed)
Location:  [facility] Room Number: 154 Place of Service:  SNF (31) Provider: Green, Deborah, NP  CODE STATUS: DNR  Allergies  Allergen Reactions   Codeine    Sulfa Antibiotics     Chief Complaint  Patient presents with   Acute Visit    Care plan meeting    HPI:  We have come together for her care plan meeting. BIMS 15/15 mood 6/30: not eating well; decreased energy; some depression. She continues to have abdominal pain; now with bilateral leg and arm pain. She had wanted to go to the ED for further evaluation and to get an MRI of her body. She was educated on the length of time she would spend waiting to be seen. She is willing to start cymbalta daily and will take gabapentin for her pain now. Since stopping her gabapentin her pain has gotten worse.  She requires extensive assist to dependent for her adls. She is nonambulatory and requires a lift for transfers. Has foley is incontinent of bowel. There have been no falls. Dietary: will change her to a regular diet; weight is 159.5 pounds which is up 6 pounds in the past quarter. She requires supervision with her males. Therapy: PT to step out 07-04-21; OT: left shoulder pain; ST voice quality. She continues to be followed for her chronic illnesses:  Aortic atherosclerosis Quadriplegia C1-C4 incomplete Major depression recurrent chronic Hypertension associated with stage 3a chronic kidney disease due to type 2 diabetes mellitus  Past Medical History:  Diagnosis Date   DM type 2 with diabetic peripheral neuropathy (HCC)    High cholesterol    Neck pain    Neuropathy     Past Surgical History:  Procedure Laterality Date   ABDOMINAL HYSTERECTOMY     ANTERIOR CERVICAL DECOMP/DISCECTOMY FUSION N/A 02/05/2021   Procedure: Cervical Three-Four  Anterior cervical decompression/discectomy/fusion;  Surgeon: Cabbell, Kyle, MD;  Location: [facility] OR;  Service: Neurosurgery;  Laterality: N/A;  RM 20   APPENDECTOMY     BACK  SURGERY     CERVICAL DISC SURGERY     HAND SURGERY     KNEE SURGERY      Social History   Socioeconomic History   Marital status: Widowed    Spouse name: Not on file   Number of children: Not on file   Years of education: Not on file   Highest education level: Not on file  Occupational History   Not on file  Tobacco Use   Smoking status: Never   Smokeless tobacco: Never  Vaping Use   Vaping Use: Never used  Substance and Sexual Activity   Alcohol use: Never   Drug use: Never   Sexual activity: Never  Other Topics Concern   Not on file  Social History Narrative   Not on file   Social Determinants of Health   Financial Resource Strain: Not on file  Food Insecurity: Not on file  Transportation Needs: Not on file  Physical Activity: Not on file  Stress: Not on file  Social Connections: Not on file  Intimate Partner Violence: Not on file   Family History  Problem Relation Age of Onset   Cardiomyopathy Father    Cancer Mother       VITAL SIGNS BP 136/80   Pulse 82   Temp 98.6 F (37 C)   Resp 18   Ht 5' 2" (1.575 m)   Wt 159 lb 8 oz (72.3 kg)   SpO2 97%     BMI 29.17 kg/m   Outpatient Encounter Medications as of 06/27/2021  Medication Sig   acetaminophen (TYLENOL) 325 MG tablet Take 650 mg by mouth every 8 (eight) hours.   albuterol (VENTOLIN HFA) 108 (90 Base) MCG/ACT inhaler Inhale 1 puff into the lungs every 4 (four) hours as needed for wheezing or shortness of breath.   Artificial Saliva (BIOTENE MOISTURIZING MOUTH MT) Use as directed 1 application in the mouth or throat at bedtime. 9 pm   aspirin EC 81 MG tablet Take 81 mg by mouth daily. Swallow whole.9 am   atorvastatin (LIPITOR) 20 MG tablet Take 1 tablet (20 mg total) by mouth every evening.   Balsam [place]-Castor Oil (VENELEX) OINT Apply topically. Apply to sacrum, coccyx, bilateral buttocks qshift for prevention.   chlorthalidone (HYGROTON) 25 MG tablet Take 25 mg by mouth daily.   colchicine 0.6  MG tablet Take 0.6 mg by mouth daily as needed.   guaiFENesin (MUCINEX) 600 MG 12 hr tablet Take by mouth 2 (two) times daily.   insulin glargine (LANTUS) 100 UNIT/ML injection Inject 20 Units into the skin at bedtime.   insulin lispro (HUMALOG) 100 UNIT/ML KwikPen Inject 5 Units into the skin 2 (two) times daily with a meal.   lisinopril (ZESTRIL) 2.5 MG tablet Take 2.5 mg by mouth daily.   melatonin 3 MG TABS tablet Take 3 mg by mouth at bedtime.   metoCLOPramide (REGLAN) 5 MG tablet Take 5 mg by mouth 4 (four) times daily. 1/2 tablet 2.5mg before meals and at bedtime   mirtazapine (REMERON) 7.5 MG tablet Take 7.5 mg by mouth at bedtime.   NON FORMULARY Diet: NAS, Cons CHO   omeprazole (PRILOSEC) 40 MG capsule Take 40 mg by mouth daily.   rOPINIRole (REQUIP) 1 MG tablet Take 1 mg by mouth at bedtime.   tiZANidine (ZANAFLEX) 2 MG tablet Take 2 mg by mouth every 6 (six) hours as needed for muscle spasms (for back and sciatic pain).   [DISCONTINUED] Amino Acids-Protein Hydrolys (FEEDING SUPPLEMENT, PRO-STAT SUGAR FREE 64,) LIQD Take 30 mLs by mouth 3 (three) times daily with meals.   [DISCONTINUED] HYDROcodone-acetaminophen (NORCO) 5-325 MG tablet Take 1 tablet by mouth every 4 (four) hours as needed for moderate pain.   [DISCONTINUED] ondansetron (ZOFRAN-ODT) 8 MG disintegrating tablet Take 8 mg by mouth. Before Meals and At Bedtime   [DISCONTINUED] prochlorperazine (COMPAZINE) 10 MG tablet Take 1 tablet (10 mg total) by mouth every 6 (six) hours as needed for nausea or vomiting.   [DISCONTINUED] Sodium Chloride 0.9 % KIT Inject into the vein. 75 cc per hour; intravenous Special Instructions: for 2 liters only for acute renal failure with n/v. Every Shift; Day, Evening, Night   No facility-administered encounter medications on file as of 06/27/2021.     SIGNIFICANT DIAGNOSTIC EXAMS   PREVIOUS   12-02-20; ct of head: No acute intracranial abnormality. Polypoid sinus disease.  12-02-20: ct  of cervical spine:  Postoperative and degenerative changes in the cervical spine. No acute bony abnormality.  12-02-20; ct of pelvis:  1. No evidence of acute fracture or dislocation of the pelvis or hips. 2. 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 3. Degenerative changes in the hips with subcortical cysts on the left hip.  12-02-20: ct of left knee:  1. Subtle acute nondisplaced fracture through the medial aspect of the head of the fibula. 2. 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 3. There is a small joint effusion.  12-02-20: mri   left hip:  1. No acute findings involving the left hip. 2. Mild chronic bilateral hip avascular necrosis. Moderate degenerative hip findings bilaterally. No hip effusion or regional bursitis. 3. Mild left greater than right hamstring tendinopathy. 4. 1.3 cm left eccentric Bartholin's cyst or Gartner cyst.  12-02-20: mri lumbar spine:  1. 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 2. L3-4: Bilateral posterolateral disc bulges, more prominent on the left. Mild left foraminal encroachment that could possibly affect the left L3 nerve. Moderate to severe bilateral facet osteoarthritis. These findings could relate to back pain or referred facet syndrome pain. 3. L4-5: Bilateral facet arthropathy with 2 mm of anterolisthesis. Bulging of the disc more prominent towards the right. Mild narrowing of the lateral recesses and of the intervertebral foramen on the right, but without visible neural compression. Findings at this level could relate to back pain or referred facet syndrome pain. 4. L5-S1: Previous left hemilaminectomy. Endplate osteophytes and bulging of the disc more prominent towards the left. Facet degeneration and hypertrophy left more than right with left foraminal stenosis and subarticular lateral recess stenosis could cause left-sided neural compression.   01-25-21: ct of head;  maxillofacial cervical spine:  1. No acute intracranial pathology. 2. No acute/traumatic cervical spine pathology. 3. No acute facial bone fractures.  01-26-21: ct of chest abdomen and pelvis:  1. No noncontrast CT evidence of acute traumatic injury to the chest, abdomen, or pelvis. 2. Distended urinary bladder. Mild bilateral hydronephrosis and hydroureter to the bilateral ureterovesicular junctions. No obstructing calculus or other etiology identified. Correlate for urinary retention. 3. No fracture or dislocation of the lumbar spine. Moderate disc space height loss and osteophytosis at L5-S1. Probable small broad-based posterior disc bulges at L4-L5 and L5-S1. 4. Coronary artery disease. Aortic Atherosclerosis  01-26-21: ct of right shoulder:  1.  No acute osseous injury of the right shoulder. 2. Mild mineralization in the infraspinatus tendon as can be seen with calcific tendinosis.  01-29-21: MRI of spine:  Postoperative and multilevel degenerative changes of the cervical spine. There is severe canal stenosis with cord compression at C3-C4. Abnormal cord signal is present just below this level (at operative levels) and may reflect edema or myelomalacia No significant degenerative changes of the thoracic spine. Multilevel degenerative changes of lumbar spine similar to recent prior study.  01-30-21: renal ultrasound:  1. No acute abnormality. Small amount of right perinephric fluid, nonspecific.  02-05-21: chest x-ray:  1. Right internal jugular central venous catheter with tip over the mid SVC. No pneumothorax. 2. Hazy lung base opacities likely represent combination of atelectasis and pleural fluid. Vascular congestion.   03-06-21 chest x-ray: left lower lobe infiltrate likely representing pneumonia   03-26-21: lumbar x-ray: mild to moderate degenerative changes in lumbar spine  03-27-21; DEXA scan: t score -1.777  06-22-21: ct of abdomen and pelvis:  Left colonic diverticulosis.  No  active diverticulitis. Bilateral hydronephrosis to the level of the bladder. No obstructing stones. Urinary bladder is distended with Foley catheter in place. Aortic atherosclerosis.  NO NEW EXAMS.     LABS REVIEWED PREVIOUS   12-02-20: wbc 6.4; hgb 11.3; hct 34.4; mcv 93.0 plt 216; glucose 209; bun 33; creat 1.13; k+ 4.3; na++ 137; ca 9.0; GFR 49; hgb a1c 8.6 12-03-20: wbc 6.5; hgb 9.8; hct 30.5 mcv 93.8 plt 182; glucose 188;bun 41; creat 1.49; k+ 4.4; na++ 134; ca 8.2; GFR 35 12-04-20; glucose 285; bun 29; creat 1.00; k+ 4.3; na++ 138; ca 8.7 GFR 56  01-25-21:  wbc 6.4; hgb 10.6; hct 31.7; mcv 91.1 plt 227; glucose 152; bun 40; creat 1.2 k+ 4.2; na++ 135; ca 8.7; GFR 42; liver normal albumin 3.5 01-29-21: urine and blood culture: klebsiella pneumoniae 01-31-21: wbc 13.1; hgb 8.3; hct 23.6; mcv 88.1 plt 153; glucose 162; bun 67; creat 2.77; k+ 4.8; na++ 125; ca 7.5; GFR 17; ast 62; alt 57; albumin 1.7 02-04-21: wbc 9.8; hgb 7.6; hct 22.0; mcv 85.3 plt 212; glucose 161 bun 40; creat 1.40; k+ 3.4; na++ 127; ca 7.5; GFR 38; ast 62; alt 57; albumin 1.7  02-08-21: wbc 10.4; hgb 9.2; hct 27.6; mcv 87.3 plt 433; glucose 186; bun 23; creat 1.00 ;k+ 4.4; na++ 131; ca 8.1; GFR 56; ast 60; alt 61; albumin 2.1  02-20-21: hgb 9.4; hct 29.9 glucose 154; bun 51; creat 1.17; k+ 4.5; na++ 136; ca 8.9; GFR 47 02-24-21: glucose 175; bun 62; creat 1.13; k+ 4.5; na++ 133; ca 9.0 GFR 49 03-06-21: wbc 5.3; hgb 8.8; hct 27.1; mcv 93.4 plt 262; glucose 453; bun 66; creat 1.30; k+ 4.6; na++ 135; ca 9.0; GFR 41; d-dimer: 2.16; CRP 3.0  03-13-21: d-dimer: 1.49 03-20-21: hep C nr; d-dimer 0.86 03-27-21: d-dimer 0.52 03-31-21: wbc 5.5; hgb 8.5; hct 26.8; mcv 93.4 plt 203; hgb a1c 7.6; chol 130; ldl 68; trig 138; hdl 34; urine micro-albumin 25.1 06-12-21: uric acid 5.9 06-15-21: wbc 6.0; hgb 9.8; hct 29.4; mcv 90,7 plt 242; glucose 187; bun 65; creat 1.33; k+ 4.2; na++ 131; ca 8.9; GFR 40; liver normal albumin 3.3  06-19-21: wbc 7.3; hgb 9.9;  hct 30.1; mcv 89,9 plt 306; glucose 115; bun 29; creat 1.05; k+ 4.1; na++ 131; ca 8.7; GFR 53; liver normal albumin 3.1; urine micro-albumin 85.3 06-22-21; wbc 11.8; hgb 10.4; hct 30.9; mcv 89,3 plt 375; glucose 288; bun 44; creat 1.46; k+ 3.5; na++ 125; ca 8.3 GFR 36; liver normal albumin 3.3; lipase 37   NO NEW LABS.   Review of Systems  Constitutional:  Negative for malaise/fatigue.  Respiratory:  Negative for cough and shortness of breath.   Cardiovascular:  Negative for chest pain, palpitations and leg swelling.  Gastrointestinal:  Positive for abdominal pain. Negative for constipation and heartburn.  Musculoskeletal:  Positive for joint pain and myalgias. Negative for back pain.  Skin: Negative.   Neurological:  Negative for dizziness.  Psychiatric/Behavioral:  The patient is not nervous/anxious.    Physical Exam Constitutional:      General: She is not in acute distress.    Appearance: She is well-developed. She is not diaphoretic.  Neck:     Thyroid: No thyromegaly.  Cardiovascular:     Rate and Rhythm: Normal rate and regular rhythm.     Heart sounds: Normal heart sounds.  Pulmonary:     Effort: Pulmonary effort is normal. No respiratory distress.     Breath sounds: Normal breath sounds.  Abdominal:     General: Bowel sounds are normal. There is distension.     Palpations: Abdomen is soft.     Tenderness: There is abdominal tenderness.  Genitourinary:    Comments: foley Musculoskeletal:        General: Normal range of motion.     Cervical back: Neck supple.     Right lower leg: No edema.     Left lower leg: No edema.  Lymphadenopathy:     Cervical: No cervical adenopathy.  Skin:    General: Skin is warm and dry.  Neurological:     Mental Status:  She is alert. Mental status is at baseline.  Psychiatric:        Mood and Affect: Mood normal.     ASSESSMENT/ PLAN:  TODAY  Aortic atherosclerosis Quadriplegia C1-C4 incomplete Major depression recurrent  chronic Hypertension associated with stage 3a chronic kidney disease due to type 2 diabetes mellitus  Will stop remeron;  Will begin cymbalta 30 mg daily for one week then 60 mg daily  Will give gabapentin 100 mg now for pain Will continue current plan of care Will continue to monitor her status.    Time spent with patient: 40 minutes: medications; therapy and plan of care.    [name] NP [facility]  Contact [phone] Monday through Friday 8am- 5pm  After hours call [phone]

## 2021-06-27 NOTE — Progress Notes (Signed)
Location:  [facility] Room Number: 154 Place of Service:  SNF (31)   CODE STATUS: dnr   Allergies  Allergen Reactions   Codeine    Sulfa Antibiotics     Chief Complaint  Patient presents with   Acute Visit    Pain management     HPI:  She is having significant pain: abdominal leg pain arm pain. She has had her gabapentin stopped per neurology recommendations. She is in emotional distress due to her pain. She is wanting to go to the ED for an MRI. When told that she would have to wait up to 10 to 15 hours before being seen; she is willing to be treated here. We have discussed her medications; will start her on cymbalta.   Past Medical History:  Diagnosis Date   DM type 2 with diabetic peripheral neuropathy (HCC)    High cholesterol    Neck pain    Neuropathy     Past Surgical History:  Procedure Laterality Date   ABDOMINAL HYSTERECTOMY     ANTERIOR CERVICAL DECOMP/DISCECTOMY FUSION N/A 02/05/2021   Procedure: Cervical Three-Four  Anterior cervical decompression/discectomy/fusion;  Surgeon: Ashok Pall, MD;  Location: [facility];  Service: Neurosurgery;  Laterality: N/A;  RM 20   APPENDECTOMY     BACK SURGERY     CERVICAL DISC SURGERY     HAND SURGERY     KNEE SURGERY      Social History   Socioeconomic History   Marital status: Widowed    Spouse name: Not on file   Number of children: Not on file   Years of education: Not on file   Highest education level: Not on file  Occupational History   Not on file  Tobacco Use   Smoking status: Never   Smokeless tobacco: Never  Vaping Use   Vaping Use: Never used  Substance and Sexual Activity   Alcohol use: Never   Drug use: Never   Sexual activity: Never  Other Topics Concern   Not on file  Social History Narrative   Not on file   Social Determinants of Health   Financial Resource Strain: Not on file  Food Insecurity: Not on file  Transportation Needs: Not on file  Physical Activity:  Not on file  Stress: Not on file  Social Connections: Not on file  Intimate Partner Violence: Not on file   Family History  Problem Relation Age of Onset   Cardiomyopathy Father    Cancer Mother       VITAL SIGNS BP 138/80   Pulse 82   Temp 98.6 F (37 C)   Ht 5\' 2"  (1.575 m)   Wt 159 lb 8 oz (72.3 kg)   BMI 29.17 kg/m   Outpatient Encounter Medications as of 06/27/2021  Medication Sig   acetaminophen (TYLENOL) 325 MG tablet Take 650 mg by mouth every 8 (eight) hours.   albuterol (VENTOLIN HFA) 108 (90 Base) MCG/ACT inhaler Inhale 1 puff into the lungs every 4 (four) hours as needed for wheezing or shortness of breath.   Artificial Saliva (BIOTENE MOISTURIZING MOUTH MT) Use as directed 1 application in the mouth or throat at bedtime. 9 pm   aspirin EC 81 MG tablet Take 81 mg by mouth daily. Swallow whole.9 am   atorvastatin (LIPITOR) 20 MG tablet Take 1 tablet (20 mg total) by mouth every evening.   Balsam [place]-Castor Oil (VENELEX) OINT Apply topically. Apply to sacrum, coccyx, bilateral buttocks qshift  for prevention.   chlorthalidone (HYGROTON) 25 MG tablet Take 25 mg by mouth daily.   colchicine 0.6 MG tablet Take 0.6 mg by mouth daily as needed.   guaiFENesin (MUCINEX) 600 MG 12 hr tablet Take by mouth 2 (two) times daily.   insulin glargine (LANTUS) 100 UNIT/ML injection Inject 20 Units into the skin at bedtime.   insulin lispro (HUMALOG) 100 UNIT/ML KwikPen Inject 5 Units into the skin 2 (two) times daily with a meal.   lisinopril (ZESTRIL) 2.5 MG tablet Take 2.5 mg by mouth daily.   melatonin 3 MG TABS tablet Take 3 mg by mouth at bedtime.   metoCLOPramide (REGLAN) 5 MG tablet Take 5 mg by mouth 4 (four) times daily. 1/2 tablet 2.5mg  before meals and at bedtime   mirtazapine (REMERON) 7.5 MG tablet Take 7.5 mg by mouth at bedtime.   NON FORMULARY Diet: NAS, Cons CHO   omeprazole (PRILOSEC) 40 MG capsule Take 40 mg by mouth daily.   rOPINIRole (REQUIP) 1 MG tablet Take  1 mg by mouth at bedtime.   tiZANidine (ZANAFLEX) 2 MG tablet Take 2 mg by mouth every 6 (six) hours as needed for muscle spasms (for back and sciatic pain).   No facility-administered encounter medications on file as of 06/27/2021.     SIGNIFICANT DIAGNOSTIC EXAMS   PREVIOUS   12-02-20; ct of head: No acute intracranial abnormality. Polypoid sinus disease.  12-02-20: ct of cervical spine:  Postoperative and degenerative changes in the cervical spine. No acute bony abnormality.  12-02-20; ct of pelvis:  1. No evidence of acute fracture or dislocation of the pelvis or hips. 2. 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 3. Degenerative changes in the hips with subcortical cysts on the left hip.  12-02-20: ct of left knee:  1. Subtle acute nondisplaced fracture through the medial aspect of the head of the fibula. 2. 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 3. There is a small joint effusion.  12-02-20: mri left hip:  1. No acute findings involving the left hip. 2. Mild chronic bilateral hip avascular necrosis. Moderate degenerative hip findings bilaterally. No hip effusion or regional bursitis. 3. Mild left greater than right hamstring tendinopathy. 4. 1.3 cm left eccentric Bartholin's cyst or Gartner cyst.  12-02-20: mri lumbar spine:  1. 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 2. L3-4: Bilateral posterolateral disc bulges, more prominent on the left. Mild left foraminal encroachment that could possibly affect the left L3 nerve. Moderate to severe bilateral facet osteoarthritis. These findings could relate to back pain or referred facet syndrome pain. 3. L4-5: Bilateral facet arthropathy with 2 mm of anterolisthesis. Bulging of the disc more prominent towards the right. Mild narrowing of the lateral recesses and of the intervertebral foramen on the right, but without visible neural compression. Findings at  this level could relate to back pain or referred facet syndrome pain. 4. L5-S1: Previous left hemilaminectomy. Endplate osteophytes and bulging of the disc more prominent towards the left. Facet degeneration and hypertrophy left more than right with left foraminal stenosis and subarticular lateral recess stenosis could cause left-sided neural compression.   01-25-21: ct of head; maxillofacial cervical spine:  1. No acute intracranial pathology. 2. No acute/traumatic cervical spine pathology. 3. No acute facial bone fractures.  01-26-21: ct of chest abdomen and pelvis:  1. No noncontrast CT evidence of acute traumatic injury to the chest, abdomen, or pelvis. 2. Distended urinary bladder. Mild bilateral hydronephrosis and hydroureter to  the bilateral ureterovesicular junctions. No obstructing calculus or other etiology identified. Correlate for urinary retention. 3. No fracture or dislocation of the lumbar spine. Moderate disc space height loss and osteophytosis at L5-S1. Probable small broad-based posterior disc bulges at L4-L5 and L5-S1. 4. Coronary artery disease. Aortic Atherosclerosis  01-26-21: ct of right shoulder:  1.  No acute osseous injury of the right shoulder. 2. Mild mineralization in the infraspinatus tendon as can be seen with calcific tendinosis.  01-29-21: MRI of spine:  Postoperative and multilevel degenerative changes of the cervical spine. There is severe canal stenosis with cord compression at C3-C4. Abnormal cord signal is present just below this level (at operative levels) and may reflect edema or myelomalacia No significant degenerative changes of the thoracic spine. Multilevel degenerative changes of lumbar spine similar to recent prior study.  01-30-21: renal ultrasound:  1. No acute abnormality. Small amount of right perinephric fluid, nonspecific.  02-05-21: chest x-ray:  1. Right internal jugular central venous catheter with tip over the mid SVC. No pneumothorax. 2.  Hazy lung base opacities likely represent combination of atelectasis and pleural fluid. Vascular congestion.   03-06-21 chest x-ray: left lower lobe infiltrate likely representing pneumonia   03-26-21: lumbar x-ray: mild to moderate degenerative changes in lumbar spine  03-27-21; DEXA scan: t score -1.777  06-22-21: ct of abdomen and pelvis:  Left colonic diverticulosis.  No active diverticulitis. Bilateral hydronephrosis to the level of the bladder. No obstructing stones. Urinary bladder is distended with Foley catheter in place. Aortic atherosclerosis.  NO NEW EXAMS.     LABS REVIEWED PREVIOUS   12-02-20: wbc 6.4; hgb 11.3; hct 34.4; mcv 93.0 plt 216; glucose 209; bun 33; creat 1.13; k+ 4.3; na++ 137; ca 9.0; GFR 49; hgb a1c 8.6 12-03-20: wbc 6.5; hgb 9.8; hct 30.5 mcv 93.8 plt 182; glucose 188;bun 41; creat 1.49; k+ 4.4; na++ 134; ca 8.2; GFR 35 12-04-20; glucose 285; bun 29; creat 1.00; k+ 4.3; na++ 138; ca 8.7 GFR 56  01-25-21: wbc 6.4; hgb 10.6; hct 31.7; mcv 91.1 plt 227; glucose 152; bun 40; creat 1.2 k+ 4.2; na++ 135; ca 8.7; GFR 42; liver normal albumin 3.5 01-29-21: urine and blood culture: klebsiella pneumoniae 01-31-21: wbc 13.1; hgb 8.3; hct 23.6; mcv 88.1 plt 153; glucose 162; bun 67; creat 2.77; k+ 4.8; na++ 125; ca 7.5; GFR 17; ast 62; alt 57; albumin 1.7 02-04-21: wbc 9.8; hgb 7.6; hct 22.0; mcv 85.3 plt 212; glucose 161 bun 40; creat 1.40; k+ 3.4; na++ 127; ca 7.5; GFR 38; ast 62; alt 57; albumin 1.7  02-08-21: wbc 10.4; hgb 9.2; hct 27.6; mcv 87.3 plt 433; glucose 186; bun 23; creat 1.00 ;k+ 4.4; na++ 131; ca 8.1; GFR 56; ast 60; alt 61; albumin 2.1  02-20-21: hgb 9.4; hct 29.9 glucose 154; bun 51; creat 1.17; k+ 4.5; na++ 136; ca 8.9; GFR 47 02-24-21: glucose 175; bun 62; creat 1.13; k+ 4.5; na++ 133; ca 9.0 GFR 49 03-06-21: wbc 5.3; hgb 8.8; hct 27.1; mcv 93.4 plt 262; glucose 453; bun 66; creat 1.30; k+ 4.6; na++ 135; ca 9.0; GFR 41; d-dimer: 2.16; CRP 3.0  03-13-21: d-dimer:  1.49 03-20-21: hep C nr; d-dimer 0.86 03-27-21: d-dimer 0.52 03-31-21: wbc 5.5; hgb 8.5; hct 26.8; mcv 93.4 plt 203; hgb a1c 7.6; chol 130; ldl 68; trig 138; hdl 34; urine micro-albumin 25.1 06-12-21: uric acid 5.9 06-15-21: wbc 6.0; hgb 9.8; hct 29.4; mcv 90,7 plt 242; glucose 187; bun 65; creat 1.33; k+ 4.2;  na++ 131; ca 8.9; GFR 40; liver normal albumin 3.3  06-19-21: wbc 7.3; hgb 9.9; hct 30.1; mcv 89,9 plt 306; glucose 115; bun 29; creat 1.05; k+ 4.1; na++ 131; ca 8.7; GFR 53; liver normal albumin 3.1; urine micro-albumin 85.3 06-22-21; wbc 11.8; hgb 10.4; hct 30.9; mcv 89,3 plt 375; glucose 288; bun 44; creat 1.46; k+ 3.5; na++ 125; ca 8.3 GFR 36; liver normal albumin 3.3; lipase 37   NO NEW LABS.   Review of Systems  Constitutional:  Negative for malaise/fatigue.  Respiratory:  Negative for cough and shortness of breath.   Cardiovascular:  Negative for chest pain, palpitations and leg swelling.  Gastrointestinal:  Positive for abdominal pain. Negative for constipation and heartburn.  Musculoskeletal:  Positive for joint pain and myalgias. Negative for back pain.  Skin: Negative.   Neurological:  Negative for dizziness.  Psychiatric/Behavioral:  The patient is not nervous/anxious.    Physical Exam Constitutional:      General: She is not in acute distress.    Appearance: She is well-developed. She is not diaphoretic.  Neck:     Thyroid: No thyromegaly.  Cardiovascular:     Rate and Rhythm: Normal rate and regular rhythm.     Pulses: Normal pulses.     Heart sounds: Normal heart sounds.  Pulmonary:     Effort: Pulmonary effort is normal. No respiratory distress.     Breath sounds: Normal breath sounds.  Abdominal:     General: Bowel sounds are normal. There is no distension.     Palpations: Abdomen is soft.     Tenderness: There is abdominal tenderness.  Genitourinary:    Comments: Foley  Musculoskeletal:     Cervical back: Neck supple.     Right lower leg: No edema.      Left lower leg: No edema.     Comments: Is able to move all extremities   Lymphadenopathy:     Cervical: No cervical adenopathy.  Skin:    General: Skin is warm and dry.  Neurological:     Mental Status: She is alert and oriented to person, place, and time.  Psychiatric:        Mood and Affect: Mood normal.     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    [name] NP [facility]  Contact [phone] Monday through Friday 8am- 5pm  After hours call [phone]

## 2021-06-27 NOTE — Progress Notes (Signed)
This encounter was created in error - please disregard.

## 2021-06-28 DIAGNOSIS — R498 Other voice and resonance disorders: Secondary | ICD-10-CM | POA: Diagnosis not present

## 2021-06-28 DIAGNOSIS — R1310 Dysphagia, unspecified: Secondary | ICD-10-CM | POA: Diagnosis not present

## 2021-06-28 DIAGNOSIS — R279 Unspecified lack of coordination: Secondary | ICD-10-CM | POA: Diagnosis not present

## 2021-06-28 DIAGNOSIS — S14124D Central cord syndrome at C4 level of cervical spinal cord, subsequent encounter: Secondary | ICD-10-CM | POA: Diagnosis not present

## 2021-06-28 DIAGNOSIS — M25511 Pain in right shoulder: Secondary | ICD-10-CM | POA: Diagnosis not present

## 2021-06-28 DIAGNOSIS — M6281 Muscle weakness (generalized): Secondary | ICD-10-CM | POA: Diagnosis not present

## 2021-06-28 DIAGNOSIS — M25512 Pain in left shoulder: Secondary | ICD-10-CM | POA: Diagnosis not present

## 2021-06-30 DIAGNOSIS — M25512 Pain in left shoulder: Secondary | ICD-10-CM | POA: Diagnosis not present

## 2021-06-30 DIAGNOSIS — M6281 Muscle weakness (generalized): Secondary | ICD-10-CM | POA: Diagnosis not present

## 2021-06-30 DIAGNOSIS — M25511 Pain in right shoulder: Secondary | ICD-10-CM | POA: Diagnosis not present

## 2021-06-30 DIAGNOSIS — S14124D Central cord syndrome at C4 level of cervical spinal cord, subsequent encounter: Secondary | ICD-10-CM | POA: Diagnosis not present

## 2021-06-30 DIAGNOSIS — R498 Other voice and resonance disorders: Secondary | ICD-10-CM | POA: Diagnosis not present

## 2021-06-30 DIAGNOSIS — R279 Unspecified lack of coordination: Secondary | ICD-10-CM | POA: Diagnosis not present

## 2021-06-30 DIAGNOSIS — R1310 Dysphagia, unspecified: Secondary | ICD-10-CM | POA: Diagnosis not present

## 2021-07-01 DIAGNOSIS — M25511 Pain in right shoulder: Secondary | ICD-10-CM | POA: Diagnosis not present

## 2021-07-01 DIAGNOSIS — R279 Unspecified lack of coordination: Secondary | ICD-10-CM | POA: Diagnosis not present

## 2021-07-01 DIAGNOSIS — S14124D Central cord syndrome at C4 level of cervical spinal cord, subsequent encounter: Secondary | ICD-10-CM | POA: Diagnosis not present

## 2021-07-01 DIAGNOSIS — M25512 Pain in left shoulder: Secondary | ICD-10-CM | POA: Diagnosis not present

## 2021-07-01 DIAGNOSIS — R1310 Dysphagia, unspecified: Secondary | ICD-10-CM | POA: Diagnosis not present

## 2021-07-01 DIAGNOSIS — M6281 Muscle weakness (generalized): Secondary | ICD-10-CM | POA: Diagnosis not present

## 2021-07-01 DIAGNOSIS — R498 Other voice and resonance disorders: Secondary | ICD-10-CM | POA: Diagnosis not present

## 2021-07-02 DIAGNOSIS — R1310 Dysphagia, unspecified: Secondary | ICD-10-CM | POA: Diagnosis not present

## 2021-07-02 DIAGNOSIS — R279 Unspecified lack of coordination: Secondary | ICD-10-CM | POA: Diagnosis not present

## 2021-07-02 DIAGNOSIS — M25512 Pain in left shoulder: Secondary | ICD-10-CM | POA: Diagnosis not present

## 2021-07-02 DIAGNOSIS — R498 Other voice and resonance disorders: Secondary | ICD-10-CM | POA: Diagnosis not present

## 2021-07-02 DIAGNOSIS — M6281 Muscle weakness (generalized): Secondary | ICD-10-CM | POA: Diagnosis not present

## 2021-07-02 DIAGNOSIS — S14124D Central cord syndrome at C4 level of cervical spinal cord, subsequent encounter: Secondary | ICD-10-CM | POA: Diagnosis not present

## 2021-07-02 DIAGNOSIS — M25511 Pain in right shoulder: Secondary | ICD-10-CM | POA: Diagnosis not present

## 2021-07-03 DIAGNOSIS — S14124D Central cord syndrome at C4 level of cervical spinal cord, subsequent encounter: Secondary | ICD-10-CM | POA: Diagnosis not present

## 2021-07-03 DIAGNOSIS — M6281 Muscle weakness (generalized): Secondary | ICD-10-CM | POA: Diagnosis not present

## 2021-07-03 DIAGNOSIS — M25511 Pain in right shoulder: Secondary | ICD-10-CM | POA: Diagnosis not present

## 2021-07-03 DIAGNOSIS — R1310 Dysphagia, unspecified: Secondary | ICD-10-CM | POA: Diagnosis not present

## 2021-07-03 DIAGNOSIS — M25512 Pain in left shoulder: Secondary | ICD-10-CM | POA: Diagnosis not present

## 2021-07-03 DIAGNOSIS — R279 Unspecified lack of coordination: Secondary | ICD-10-CM | POA: Diagnosis not present

## 2021-07-03 DIAGNOSIS — R498 Other voice and resonance disorders: Secondary | ICD-10-CM | POA: Diagnosis not present

## 2021-07-04 ENCOUNTER — Non-Acute Institutional Stay (SKILLED_NURSING_FACILITY): Payer: Medicare HMO | Admitting: Adult Health

## 2021-07-04 ENCOUNTER — Encounter: Payer: Self-pay | Admitting: Adult Health

## 2021-07-04 DIAGNOSIS — M6281 Muscle weakness (generalized): Secondary | ICD-10-CM | POA: Diagnosis not present

## 2021-07-04 DIAGNOSIS — M25512 Pain in left shoulder: Secondary | ICD-10-CM | POA: Diagnosis not present

## 2021-07-04 DIAGNOSIS — M87052 Idiopathic aseptic necrosis of left femur: Secondary | ICD-10-CM | POA: Diagnosis not present

## 2021-07-04 DIAGNOSIS — E1142 Type 2 diabetes mellitus with diabetic polyneuropathy: Secondary | ICD-10-CM

## 2021-07-04 DIAGNOSIS — M25511 Pain in right shoulder: Secondary | ICD-10-CM | POA: Diagnosis not present

## 2021-07-04 DIAGNOSIS — S14124D Central cord syndrome at C4 level of cervical spinal cord, subsequent encounter: Secondary | ICD-10-CM | POA: Diagnosis not present

## 2021-07-04 DIAGNOSIS — I700 Atherosclerosis of aorta: Secondary | ICD-10-CM

## 2021-07-04 DIAGNOSIS — R279 Unspecified lack of coordination: Secondary | ICD-10-CM | POA: Diagnosis not present

## 2021-07-04 DIAGNOSIS — G8252 Quadriplegia, C1-C4 incomplete: Secondary | ICD-10-CM

## 2021-07-04 DIAGNOSIS — M87051 Idiopathic aseptic necrosis of right femur: Secondary | ICD-10-CM

## 2021-07-04 DIAGNOSIS — R1310 Dysphagia, unspecified: Secondary | ICD-10-CM | POA: Diagnosis not present

## 2021-07-04 DIAGNOSIS — R498 Other voice and resonance disorders: Secondary | ICD-10-CM | POA: Diagnosis not present

## 2021-07-04 NOTE — Progress Notes (Signed)
Location:  [facility] Room Number: 154 Place of Service:  SNF (31)   CODE STATUS: dnr   Allergies  Allergen Reactions   Codeine    Sulfa Antibiotics     Chief Complaint  Patient presents with   Acute Visit    Care plan meeting.     HPI:  We have come together for her care plan meeting.  Family present. BIMS 15/15 mood 6/30: decreased energy; appetite; some depression. She is non-ambulatory she requires extensive to dependent care for her adls. She is incontinent of bowel; has foley. There have been no falls. She has a GI consult pending for her chronic nausea with vomiting. Dietary: her weight has been stable for the past 5 months. Therapy: she has plateau with PT; OT: for left shoulder pain: ROM: massage; biofreeze. ST: voice quality. She continues to be followed for her chronic illnesses:  Aortic atherosclerosis  Diabetic peripheral neuropathy Quadriplegia C1-C4 incomplete Avascular necrosis of bones of both hips  Past Medical History:  Diagnosis Date   DM type 2 with diabetic peripheral neuropathy (HCC)    High cholesterol    Neck pain    Neuropathy     Past Surgical History:  Procedure Laterality Date   ABDOMINAL HYSTERECTOMY     ANTERIOR CERVICAL DECOMP/DISCECTOMY FUSION N/A 02/05/2021   Procedure: Cervical Three-Four  Anterior cervical decompression/discectomy/fusion;  Surgeon: Ashok Pall, MD;  Location: [facility];  Service: Neurosurgery;  Laterality: N/A;  RM 20   APPENDECTOMY     BACK SURGERY     CERVICAL DISC SURGERY     HAND SURGERY     KNEE SURGERY      Social History   Socioeconomic History   Marital status: Widowed    Spouse name: Not on file   Number of children: Not on file   Years of education: Not on file   Highest education level: Not on file  Occupational History   Not on file  Tobacco Use   Smoking status: Never   Smokeless tobacco: Never  Vaping Use   Vaping Use: Never used  Substance and Sexual Activity   Alcohol  use: Never   Drug use: Never   Sexual activity: Never  Other Topics Concern   Not on file  Social History Narrative   Not on file   Social Determinants of Health   Financial Resource Strain: Not on file  Food Insecurity: Not on file  Transportation Needs: Not on file  Physical Activity: Not on file  Stress: Not on file  Social Connections: Not on file  Intimate Partner Violence: Not on file   Family History  Problem Relation Age of Onset   Cardiomyopathy Father    Cancer Mother       VITAL SIGNS BP (!) 141/56   Pulse 78   Temp 98.4 F (36.9 C)   Resp 18   Ht 5\' 2"  (1.575 m)   Wt 153 lb 8 oz (69.6 kg)   SpO2 97%   BMI 28.08 kg/m   Outpatient Encounter Medications as of 07/04/2021  Medication Sig   acetaminophen (TYLENOL) 325 MG tablet Take 650 mg by mouth every 8 (eight) hours.   albuterol (VENTOLIN HFA) 108 (90 Base) MCG/ACT inhaler Inhale 1 puff into the lungs every 4 (four) hours as needed for wheezing or shortness of breath.   Artificial Saliva (BIOTENE MOISTURIZING MOUTH MT) Use as directed 1 application in the mouth or throat at bedtime. 9 pm  aspirin EC 81 MG tablet Take 81 mg by mouth daily. Swallow whole.9 am   atorvastatin (LIPITOR) 20 MG tablet Take 1 tablet (20 mg total) by mouth every evening.   Balsam [place]-Castor Oil (VENELEX) OINT Apply topically. Apply to sacrum, coccyx, bilateral buttocks qshift for prevention.   chlorthalidone (HYGROTON) 25 MG tablet Take 25 mg by mouth daily.   colchicine 0.6 MG tablet Take 0.6 mg by mouth daily as needed.   guaiFENesin (MUCINEX) 600 MG 12 hr tablet Take by mouth 2 (two) times daily.   insulin glargine (LANTUS) 100 UNIT/ML injection Inject 20 Units into the skin at bedtime.   insulin lispro (HUMALOG) 100 UNIT/ML KwikPen Inject 5 Units into the skin 2 (two) times daily with a meal.   lisinopril (ZESTRIL) 2.5 MG tablet Take 2.5 mg by mouth daily.   melatonin 3 MG TABS tablet Take 3 mg by mouth at bedtime.    metoCLOPramide (REGLAN) 5 MG tablet Take 5 mg by mouth 4 (four) times daily. 1/2 tablet 2.5mg  before meals and at bedtime   mirtazapine (REMERON) 7.5 MG tablet Take 7.5 mg by mouth at bedtime.   NON FORMULARY Diet: NAS, Cons CHO   omeprazole (PRILOSEC) 40 MG capsule Take 40 mg by mouth daily.   rOPINIRole (REQUIP) 1 MG tablet Take 1 mg by mouth at bedtime.   tiZANidine (ZANAFLEX) 2 MG tablet Take 2 mg by mouth every 6 (six) hours as needed for muscle spasms (for back and sciatic pain).   No facility-administered encounter medications on file as of 07/04/2021.     SIGNIFICANT DIAGNOSTIC EXAMS   PREVIOUS   12-02-20; ct of head: No acute intracranial abnormality. Polypoid sinus disease.  12-02-20: ct of cervical spine:  Postoperative and degenerative changes in the cervical spine. No acute bony abnormality.  12-02-20; ct of pelvis:  1. No evidence of acute fracture or dislocation of the pelvis or hips. 2. 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 3. Degenerative changes in the hips with subcortical cysts on the left hip.  12-02-20: ct of left knee:  1. Subtle acute nondisplaced fracture through the medial aspect of the head of the fibula. 2. 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 3. There is a small joint effusion.  12-02-20: mri left hip:  1. No acute findings involving the left hip. 2. Mild chronic bilateral hip avascular necrosis. Moderate degenerative hip findings bilaterally. No hip effusion or regional bursitis. 3. Mild left greater than right hamstring tendinopathy. 4. 1.3 cm left eccentric Bartholin's cyst or Gartner cyst.  12-02-20: mri lumbar spine:  1. 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 2. L3-4: Bilateral posterolateral disc bulges, more prominent on the left. Mild left foraminal encroachment that could possibly affect the left L3 nerve. Moderate to severe bilateral facet osteoarthritis.  These findings could relate to back pain or referred facet syndrome pain. 3. L4-5: Bilateral facet arthropathy with 2 mm of anterolisthesis. Bulging of the disc more prominent towards the right. Mild narrowing of the lateral recesses and of the intervertebral foramen on the right, but without visible neural compression. Findings at this level could relate to back pain or referred facet syndrome pain. 4. L5-S1: Previous left hemilaminectomy. Endplate osteophytes and bulging of the disc more prominent towards the left. Facet degeneration and hypertrophy left more than right with left foraminal stenosis and subarticular lateral recess stenosis could cause left-sided neural compression.   01-25-21: ct of head; maxillofacial cervical spine:  1. No acute intracranial  pathology. 2. No acute/traumatic cervical spine pathology. 3. No acute facial bone fractures.  01-26-21: ct of chest abdomen and pelvis:  1. No noncontrast CT evidence of acute traumatic injury to the chest, abdomen, or pelvis. 2. Distended urinary bladder. Mild bilateral hydronephrosis and hydroureter to the bilateral ureterovesicular junctions. No obstructing calculus or other etiology identified. Correlate for urinary retention. 3. No fracture or dislocation of the lumbar spine. Moderate disc space height loss and osteophytosis at L5-S1. Probable small broad-based posterior disc bulges at L4-L5 and L5-S1. 4. Coronary artery disease. Aortic Atherosclerosis  01-26-21: ct of right shoulder:  1.  No acute osseous injury of the right shoulder. 2. Mild mineralization in the infraspinatus tendon as can be seen with calcific tendinosis.  01-29-21: MRI of spine:  Postoperative and multilevel degenerative changes of the cervical spine. There is severe canal stenosis with cord compression at C3-C4. Abnormal cord signal is present just below this level (at operative levels) and may reflect edema or myelomalacia No significant degenerative changes of  the thoracic spine. Multilevel degenerative changes of lumbar spine similar to recent prior study.  01-30-21: renal ultrasound:  1. No acute abnormality. Small amount of right perinephric fluid, nonspecific.  02-05-21: chest x-ray:  1. Right internal jugular central venous catheter with tip over the mid SVC. No pneumothorax. 2. Hazy lung base opacities likely represent combination of atelectasis and pleural fluid. Vascular congestion.   03-06-21 chest x-ray: left lower lobe infiltrate likely representing pneumonia   03-26-21: lumbar x-ray: mild to moderate degenerative changes in lumbar spine  03-27-21; DEXA scan: t score -1.777  06-22-21: ct of abdomen and pelvis:  Left colonic diverticulosis.  No active diverticulitis. Bilateral hydronephrosis to the level of the bladder. No obstructing stones. Urinary bladder is distended with Foley catheter in place. Aortic atherosclerosis.  NO NEW EXAMS.     LABS REVIEWED PREVIOUS   12-02-20: wbc 6.4; hgb 11.3; hct 34.4; mcv 93.0 plt 216; glucose 209; bun 33; creat 1.13; k+ 4.3; na++ 137; ca 9.0; GFR 49; hgb a1c 8.6 12-03-20: wbc 6.5; hgb 9.8; hct 30.5 mcv 93.8 plt 182; glucose 188;bun 41; creat 1.49; k+ 4.4; na++ 134; ca 8.2; GFR 35 12-04-20; glucose 285; bun 29; creat 1.00; k+ 4.3; na++ 138; ca 8.7 GFR 56  01-25-21: wbc 6.4; hgb 10.6; hct 31.7; mcv 91.1 plt 227; glucose 152; bun 40; creat 1.2 k+ 4.2; na++ 135; ca 8.7; GFR 42; liver normal albumin 3.5 01-29-21: urine and blood culture: klebsiella pneumoniae 01-31-21: wbc 13.1; hgb 8.3; hct 23.6; mcv 88.1 plt 153; glucose 162; bun 67; creat 2.77; k+ 4.8; na++ 125; ca 7.5; GFR 17; ast 62; alt 57; albumin 1.7 02-04-21: wbc 9.8; hgb 7.6; hct 22.0; mcv 85.3 plt 212; glucose 161 bun 40; creat 1.40; k+ 3.4; na++ 127; ca 7.5; GFR 38; ast 62; alt 57; albumin 1.7  02-08-21: wbc 10.4; hgb 9.2; hct 27.6; mcv 87.3 plt 433; glucose 186; bun 23; creat 1.00 ;k+ 4.4; na++ 131; ca 8.1; GFR 56; ast 60; alt 61; albumin 2.1  02-20-21:  hgb 9.4; hct 29.9 glucose 154; bun 51; creat 1.17; k+ 4.5; na++ 136; ca 8.9; GFR 47 02-24-21: glucose 175; bun 62; creat 1.13; k+ 4.5; na++ 133; ca 9.0 GFR 49 03-06-21: wbc 5.3; hgb 8.8; hct 27.1; mcv 93.4 plt 262; glucose 453; bun 66; creat 1.30; k+ 4.6; na++ 135; ca 9.0; GFR 41; d-dimer: 2.16; CRP 3.0  03-13-21: d-dimer: 1.49 03-20-21: hep C nr; d-dimer 0.86 03-27-21: d-dimer 0.52 03-31-21:  wbc 5.5; hgb 8.5; hct 26.8; mcv 93.4 plt 203; hgb a1c 7.6; chol 130; ldl 68; trig 138; hdl 34; urine micro-albumin 25.1 06-12-21: uric acid 5.9 06-15-21: wbc 6.0; hgb 9.8; hct 29.4; mcv 90,7 plt 242; glucose 187; bun 65; creat 1.33; k+ 4.2; na++ 131; ca 8.9; GFR 40; liver normal albumin 3.3  06-19-21: wbc 7.3; hgb 9.9; hct 30.1; mcv 89,9 plt 306; glucose 115; bun 29; creat 1.05; k+ 4.1; na++ 131; ca 8.7; GFR 53; liver normal albumin 3.1; urine micro-albumin 85.3 06-22-21; wbc 11.8; hgb 10.4; hct 30.9; mcv 89,3 plt 375; glucose 288; bun 44; creat 1.46; k+ 3.5; na++ 125; ca 8.3 GFR 36; liver normal albumin 3.3; lipase 37   NO NEW LABS.   Review of Systems  Constitutional:  Negative for malaise/fatigue.  Respiratory:  Negative for cough and shortness of breath.   Cardiovascular:  Negative for chest pain, palpitations and leg swelling.  Gastrointestinal:  Positive for abdominal pain and nausea. Negative for constipation and heartburn.  Musculoskeletal:  Positive for joint pain. Negative for back pain and myalgias.       Left shoulder   Skin: Negative.   Neurological:  Negative for dizziness.  Psychiatric/Behavioral:  The patient is not nervous/anxious.      Physical Exam Constitutional:      General: She is not in acute distress.    Appearance: She is well-developed. She is not diaphoretic.  Neck:     Thyroid: No thyromegaly.  Cardiovascular:     Rate and Rhythm: Normal rate and regular rhythm.     Pulses: Normal pulses.     Heart sounds: Normal heart sounds.  Pulmonary:     Effort: Pulmonary effort is  normal. No respiratory distress.     Breath sounds: Normal breath sounds.  Abdominal:     General: Bowel sounds are normal. There is no distension.     Palpations: Abdomen is soft.     Tenderness: There is no abdominal tenderness.  Genitourinary:    Comments: Foley  Musculoskeletal:     Cervical back: Neck supple.     Right lower leg: No edema.     Left lower leg: No edema.     Comments: Is able to move all extremities   Lymphadenopathy:     Cervical: No cervical adenopathy.  Skin:    General: Skin is warm and dry.  Neurological:     Mental Status: She is alert and oriented to person, place, and time.     ASSESSMENT/ PLAN:  TODAY  Aortic atherosclerosis  Diabetic peripheral neuropathy Quadriplegia C1-C4 incomplete  Avascular necrosis of bones of both hips  Will continue therapy as directed Will continue current plan of care Will continue to monitor her status.    Time spent with patient 40 minutes: therapy; family concerns; GI consult; medications.    [name] NP [facility]  Contact [phone] Monday through Friday 8am- 5pm  After hours call [phone]

## 2021-07-06 ENCOUNTER — Inpatient Hospital Stay (HOSPITAL_COMMUNITY)
Admission: EM | Admit: 2021-07-06 | Discharge: 2021-07-09 | DRG: 689 | Disposition: A | Discharge status: Skilled Nursing Facility | Payer: Medicare HMO | Attending: Internal Medicine | Admitting: Internal Medicine

## 2021-07-06 ENCOUNTER — Emergency Department (HOSPITAL_COMMUNITY): Payer: Medicare HMO

## 2021-07-06 ENCOUNTER — Other Ambulatory Visit: Payer: Self-pay

## 2021-07-06 ENCOUNTER — Inpatient Hospital Stay (HOSPITAL_COMMUNITY): Payer: Medicare HMO

## 2021-07-06 DIAGNOSIS — G9340 Encephalopathy, unspecified: Secondary | ICD-10-CM | POA: Diagnosis present

## 2021-07-06 DIAGNOSIS — R627 Adult failure to thrive: Secondary | ICD-10-CM | POA: Diagnosis present

## 2021-07-06 DIAGNOSIS — I6782 Cerebral ischemia: Secondary | ICD-10-CM | POA: Diagnosis not present

## 2021-07-06 DIAGNOSIS — E1122 Type 2 diabetes mellitus with diabetic chronic kidney disease: Secondary | ICD-10-CM | POA: Diagnosis not present

## 2021-07-06 DIAGNOSIS — E871 Hypo-osmolality and hyponatremia: Secondary | ICD-10-CM | POA: Diagnosis present

## 2021-07-06 DIAGNOSIS — N1831 Chronic kidney disease, stage 3a: Secondary | ICD-10-CM | POA: Diagnosis present

## 2021-07-06 DIAGNOSIS — S14124D Central cord syndrome at C4 level of cervical spinal cord, subsequent encounter: Secondary | ICD-10-CM | POA: Diagnosis not present

## 2021-07-06 DIAGNOSIS — Z981 Arthrodesis status: Secondary | ICD-10-CM

## 2021-07-06 DIAGNOSIS — E861 Hypovolemia: Secondary | ICD-10-CM | POA: Diagnosis present

## 2021-07-06 DIAGNOSIS — Z66 Do not resuscitate: Secondary | ICD-10-CM | POA: Diagnosis present

## 2021-07-06 DIAGNOSIS — B961 Klebsiella pneumoniae [K. pneumoniae] as the cause of diseases classified elsewhere: Secondary | ICD-10-CM | POA: Diagnosis present

## 2021-07-06 DIAGNOSIS — Z20822 Contact with and (suspected) exposure to covid-19: Secondary | ICD-10-CM | POA: Diagnosis present

## 2021-07-06 DIAGNOSIS — I10 Essential (primary) hypertension: Secondary | ICD-10-CM | POA: Diagnosis not present

## 2021-07-06 DIAGNOSIS — L8990 Pressure ulcer of unspecified site, unspecified stage: Secondary | ICD-10-CM | POA: Insufficient documentation

## 2021-07-06 DIAGNOSIS — R262 Difficulty in walking, not elsewhere classified: Secondary | ICD-10-CM | POA: Diagnosis present

## 2021-07-06 DIAGNOSIS — N390 Urinary tract infection, site not specified: Secondary | ICD-10-CM | POA: Diagnosis present

## 2021-07-06 DIAGNOSIS — R569 Unspecified convulsions: Secondary | ICD-10-CM | POA: Diagnosis not present

## 2021-07-06 DIAGNOSIS — B964 Proteus (mirabilis) (morganii) as the cause of diseases classified elsewhere: Secondary | ICD-10-CM | POA: Diagnosis present

## 2021-07-06 DIAGNOSIS — E1140 Type 2 diabetes mellitus with diabetic neuropathy, unspecified: Secondary | ICD-10-CM | POA: Diagnosis present

## 2021-07-06 DIAGNOSIS — Z7982 Long term (current) use of aspirin: Secondary | ICD-10-CM

## 2021-07-06 DIAGNOSIS — N183 Chronic kidney disease, stage 3 unspecified: Secondary | ICD-10-CM | POA: Diagnosis not present

## 2021-07-06 DIAGNOSIS — M25511 Pain in right shoulder: Secondary | ICD-10-CM | POA: Diagnosis not present

## 2021-07-06 DIAGNOSIS — N1832 Chronic kidney disease, stage 3b: Secondary | ICD-10-CM

## 2021-07-06 DIAGNOSIS — E1165 Type 2 diabetes mellitus with hyperglycemia: Secondary | ICD-10-CM | POA: Diagnosis present

## 2021-07-06 DIAGNOSIS — R339 Retention of urine, unspecified: Secondary | ICD-10-CM

## 2021-07-06 DIAGNOSIS — Z8249 Family history of ischemic heart disease and other diseases of the circulatory system: Secondary | ICD-10-CM

## 2021-07-06 DIAGNOSIS — R1310 Dysphagia, unspecified: Secondary | ICD-10-CM | POA: Diagnosis not present

## 2021-07-06 DIAGNOSIS — Z885 Allergy status to narcotic agent status: Secondary | ICD-10-CM

## 2021-07-06 DIAGNOSIS — I700 Atherosclerosis of aorta: Secondary | ICD-10-CM | POA: Diagnosis present

## 2021-07-06 DIAGNOSIS — R269 Unspecified abnormalities of gait and mobility: Secondary | ICD-10-CM | POA: Diagnosis present

## 2021-07-06 DIAGNOSIS — F419 Anxiety disorder, unspecified: Secondary | ICD-10-CM | POA: Diagnosis present

## 2021-07-06 DIAGNOSIS — E1142 Type 2 diabetes mellitus with diabetic polyneuropathy: Secondary | ICD-10-CM | POA: Diagnosis present

## 2021-07-06 DIAGNOSIS — N3000 Acute cystitis without hematuria: Secondary | ICD-10-CM | POA: Diagnosis not present

## 2021-07-06 DIAGNOSIS — K219 Gastro-esophageal reflux disease without esophagitis: Secondary | ICD-10-CM | POA: Diagnosis present

## 2021-07-06 DIAGNOSIS — Z794 Long term (current) use of insulin: Secondary | ICD-10-CM

## 2021-07-06 DIAGNOSIS — M6281 Muscle weakness (generalized): Secondary | ICD-10-CM | POA: Diagnosis not present

## 2021-07-06 DIAGNOSIS — F32A Depression, unspecified: Secondary | ICD-10-CM | POA: Diagnosis present

## 2021-07-06 DIAGNOSIS — R29818 Other symptoms and signs involving the nervous system: Secondary | ICD-10-CM | POA: Diagnosis not present

## 2021-07-06 DIAGNOSIS — I129 Hypertensive chronic kidney disease with stage 1 through stage 4 chronic kidney disease, or unspecified chronic kidney disease: Secondary | ICD-10-CM | POA: Diagnosis present

## 2021-07-06 DIAGNOSIS — G9341 Metabolic encephalopathy: Secondary | ICD-10-CM | POA: Diagnosis present

## 2021-07-06 DIAGNOSIS — E7800 Pure hypercholesterolemia, unspecified: Secondary | ICD-10-CM | POA: Diagnosis present

## 2021-07-06 DIAGNOSIS — J341 Cyst and mucocele of nose and nasal sinus: Secondary | ICD-10-CM | POA: Diagnosis not present

## 2021-07-06 DIAGNOSIS — R279 Unspecified lack of coordination: Secondary | ICD-10-CM | POA: Diagnosis not present

## 2021-07-06 DIAGNOSIS — G2581 Restless legs syndrome: Secondary | ICD-10-CM | POA: Diagnosis present

## 2021-07-06 DIAGNOSIS — G8250 Quadriplegia, unspecified: Secondary | ICD-10-CM | POA: Diagnosis present

## 2021-07-06 DIAGNOSIS — S14124A Central cord syndrome at C4 level of cervical spinal cord, initial encounter: Secondary | ICD-10-CM | POA: Diagnosis present

## 2021-07-06 DIAGNOSIS — E875 Hyperkalemia: Secondary | ICD-10-CM | POA: Diagnosis present

## 2021-07-06 DIAGNOSIS — S14124S Central cord syndrome at C4 level of cervical spinal cord, sequela: Secondary | ICD-10-CM | POA: Diagnosis not present

## 2021-07-06 DIAGNOSIS — R498 Other voice and resonance disorders: Secondary | ICD-10-CM | POA: Diagnosis not present

## 2021-07-06 DIAGNOSIS — Z79899 Other long term (current) drug therapy: Secondary | ICD-10-CM

## 2021-07-06 DIAGNOSIS — R4182 Altered mental status, unspecified: Secondary | ICD-10-CM | POA: Diagnosis not present

## 2021-07-06 DIAGNOSIS — M25512 Pain in left shoulder: Secondary | ICD-10-CM | POA: Diagnosis not present

## 2021-07-06 LAB — URINALYSIS, ROUTINE W REFLEX MICROSCOPIC
Bacteria, UA: NONE SEEN
Bilirubin Urine: NEGATIVE
Glucose, UA: NEGATIVE mg/dL
Hgb urine dipstick: NEGATIVE
Ketones, ur: NEGATIVE mg/dL
Nitrite: NEGATIVE
Protein, ur: 30 mg/dL — AB
Specific Gravity, Urine: 1.015 (ref 1.005–1.030)
WBC, UA: >50 WBC/hpf — ABNORMAL HIGH (ref 0–5)
pH: 7 (ref 5.0–8.0)

## 2021-07-06 LAB — GLUCOSE, CAPILLARY: Glucose-Capillary: 96 mg/dL (ref 70–99)

## 2021-07-06 LAB — CBC WITH DIFFERENTIAL/PLATELET
Abs Immature Granulocytes: 0.03 10*3/uL (ref 0.00–0.07)
Basophils Absolute: 0 10*3/uL (ref 0.0–0.1)
Basophils Relative: 1 %
Eosinophils Absolute: 0.1 10*3/uL (ref 0.0–0.5)
Eosinophils Relative: 2 %
HCT: 29.8 % — ABNORMAL LOW (ref 36.0–46.0)
Hemoglobin: 9.7 g/dL — ABNORMAL LOW (ref 12.0–15.0)
Immature Granulocytes: 0 %
Lymphocytes Relative: 35 %
Lymphs Abs: 2.6 10*3/uL (ref 0.7–4.0)
MCH: 29 pg (ref 26.0–34.0)
MCHC: 32.6 g/dL (ref 30.0–36.0)
MCV: 89.2 fL (ref 80.0–100.0)
Monocytes Absolute: 0.4 10*3/uL (ref 0.1–1.0)
Monocytes Relative: 5 %
Neutro Abs: 4.4 10*3/uL (ref 1.7–7.7)
Neutrophils Relative %: 57 %
Platelets: 409 10*3/uL — ABNORMAL HIGH (ref 150–400)
RBC: 3.34 MIL/uL — ABNORMAL LOW (ref 3.87–5.11)
RDW: 13.6 % (ref 11.5–15.5)
WBC: 7.6 10*3/uL (ref 4.0–10.5)
nRBC: 0 % (ref 0.0–0.2)

## 2021-07-06 LAB — COMPREHENSIVE METABOLIC PANEL
ALT: 24 U/L (ref 0–44)
AST: 16 U/L (ref 15–41)
Albumin: 3.4 g/dL — ABNORMAL LOW (ref 3.5–5.0)
Alkaline Phosphatase: 98 U/L (ref 38–126)
Anion gap: 11 (ref 5–15)
BUN: 44 mg/dL — ABNORMAL HIGH (ref 8–23)
CO2: 23 mmol/L (ref 22–32)
Calcium: 9.4 mg/dL (ref 8.9–10.3)
Chloride: 92 mmol/L — ABNORMAL LOW (ref 98–111)
Creatinine, Ser: 1.09 mg/dL — ABNORMAL HIGH (ref 0.44–1.00)
GFR, Estimated: 51 mL/min — ABNORMAL LOW (ref >60)
Glucose, Bld: 92 mg/dL (ref 70–99)
Potassium: 5.3 mmol/L — ABNORMAL HIGH (ref 3.5–5.1)
Sodium: 126 mmol/L — ABNORMAL LOW (ref 135–145)
Total Bilirubin: 0.3 mg/dL (ref 0.3–1.2)
Total Protein: 7.7 g/dL (ref 6.5–8.1)

## 2021-07-06 LAB — RESP PANEL BY RT-PCR (FLU A&B, COVID) ARPGX2
Influenza A by PCR: NEGATIVE
Influenza B by PCR: NEGATIVE
SARS Coronavirus 2 by RT PCR: NEGATIVE

## 2021-07-06 LAB — PROTIME-INR
INR: 1.1 (ref 0.8–1.2)
Prothrombin Time: 13.7 s (ref 11.4–15.2)

## 2021-07-06 MED ORDER — PROCHLORPERAZINE MALEATE 5 MG PO TABS: 10.0000 mg | ORAL_TABLET | Freq: Four times a day (QID) | ORAL | Status: DC | PRN

## 2021-07-06 MED ORDER — PANTOPRAZOLE SODIUM 40 MG IV SOLR
40.0000 mg | Freq: Two times a day (BID) | INTRAVENOUS | Status: DC
  Administered 2021-07-06 – 2021-07-09 (×6): 40 mg via INTRAVENOUS
  Filled 2021-07-06 (×7): qty 40

## 2021-07-06 MED ORDER — INSULIN GLARGINE-YFGN 100 UNIT/ML ~~LOC~~ SOLN
20.0000 [IU] | Freq: Every day | SUBCUTANEOUS | Status: DC
  Administered 2021-07-06: 20 [IU] via SUBCUTANEOUS
  Filled 2021-07-06 (×2): qty 0.2

## 2021-07-06 MED ORDER — INSULIN ASPART 100 UNIT/ML IJ SOLN: 0.0000 [IU] | Freq: Every day | INTRAMUSCULAR | Status: DC

## 2021-07-06 MED ORDER — ALBUTEROL SULFATE HFA 108 (90 BASE) MCG/ACT IN AERS: 1.0000 | INHALATION_SPRAY | RESPIRATORY_TRACT | Status: DC | PRN

## 2021-07-06 MED ORDER — ENOXAPARIN SODIUM 40 MG/0.4ML IJ SOSY
40.0000 mg | PREFILLED_SYRINGE | INTRAMUSCULAR | Status: DC
  Administered 2021-07-06 – 2021-07-08 (×3): 40 mg via SUBCUTANEOUS
  Filled 2021-07-06 (×3): qty 0.4

## 2021-07-06 MED ORDER — SODIUM CHLORIDE 0.9 % IV SOLN
1.0000 g | Freq: Once | INTRAVENOUS | Status: AC
  Administered 2021-07-06: 1 g via INTRAVENOUS
  Filled 2021-07-06: qty 10

## 2021-07-06 MED ORDER — INSULIN ASPART 100 UNIT/ML IJ SOLN: 5.0000 [IU] | Freq: Two times a day (BID) | INTRAMUSCULAR | Status: DC

## 2021-07-06 MED ORDER — ORAL CARE MOUTH RINSE
15.0000 mL | OROMUCOSAL | Status: DC
  Administered 2021-07-06 – 2021-07-09 (×15): 15 mL via OROMUCOSAL

## 2021-07-06 MED ORDER — ROPINIROLE HCL 1 MG PO TABS
1.0000 mg | ORAL_TABLET | Freq: Every day | ORAL | Status: DC
  Administered 2021-07-06 – 2021-07-08 (×3): 1 mg via ORAL
  Filled 2021-07-06 (×3): qty 1

## 2021-07-06 MED ORDER — LEVETIRACETAM IN NACL 500 MG/100ML IV SOLN
500.0000 mg | Freq: Two times a day (BID) | INTRAVENOUS | Status: DC
Start: 2021-07-07 — End: 2021-07-09
  Administered 2021-07-07 – 2021-07-09 (×5): 500 mg via INTRAVENOUS
  Filled 2021-07-06 (×5): qty 100

## 2021-07-06 MED ORDER — SODIUM CHLORIDE 0.9 % IV SOLN: INTRAVENOUS | Status: AC

## 2021-07-06 MED ORDER — IOHEXOL 300 MG/ML  SOLN
100.0000 mL | Freq: Once | INTRAMUSCULAR | Status: AC | PRN
  Administered 2021-07-06: 100 mL via INTRAVENOUS

## 2021-07-06 MED ORDER — MIRTAZAPINE 15 MG PO TABS
15.0000 mg | ORAL_TABLET | Freq: Every day | ORAL | Status: DC
  Administered 2021-07-06 – 2021-07-08 (×3): 15 mg via ORAL
  Filled 2021-07-06 (×3): qty 1

## 2021-07-06 MED ORDER — CHLORHEXIDINE GLUCONATE 0.12% ORAL RINSE (MEDLINE KIT)
15.0000 mL | Freq: Two times a day (BID) | OROMUCOSAL | Status: DC
  Administered 2021-07-06 – 2021-07-08 (×5): 15 mL via OROMUCOSAL

## 2021-07-06 MED ORDER — SODIUM CHLORIDE 0.9 % IV SOLN: INTRAVENOUS | Status: DC

## 2021-07-06 MED ORDER — DULOXETINE HCL 60 MG PO CPEP
60.0000 mg | ORAL_CAPSULE | Freq: Every day | ORAL | Status: DC
  Administered 2021-07-07 – 2021-07-09 (×3): 60 mg via ORAL
  Filled 2021-07-06 (×3): qty 1

## 2021-07-06 MED ORDER — ONDANSETRON HCL 4 MG/2ML IJ SOLN: 4.0000 mg | Freq: Four times a day (QID) | INTRAMUSCULAR | Status: DC | PRN

## 2021-07-06 MED ORDER — CHLORTHALIDONE 25 MG PO TABS
25.0000 mg | ORAL_TABLET | Freq: Every day | ORAL | Status: DC
  Administered 2021-07-07 – 2021-07-09 (×3): 25 mg via ORAL
  Filled 2021-07-06 (×3): qty 1

## 2021-07-06 MED ORDER — SODIUM CHLORIDE 0.9 % IV SOLN
1.0000 g | INTRAVENOUS | Status: DC
Start: 2021-07-07 — End: 2021-07-09
  Administered 2021-07-07 – 2021-07-08 (×2): 1 g via INTRAVENOUS
  Filled 2021-07-06 (×2): qty 10

## 2021-07-06 MED ORDER — METOCLOPRAMIDE HCL 5 MG/ML IJ SOLN
10.0000 mg | Freq: Four times a day (QID) | INTRAMUSCULAR | Status: DC
  Administered 2021-07-06 – 2021-07-09 (×11): 10 mg via INTRAVENOUS
  Filled 2021-07-06 (×11): qty 2

## 2021-07-06 MED ORDER — ONDANSETRON HCL 4 MG PO TABS: 4.0000 mg | ORAL_TABLET | Freq: Four times a day (QID) | ORAL | Status: DC | PRN

## 2021-07-06 MED ORDER — ASPIRIN EC 81 MG PO TBEC
81.0000 mg | DELAYED_RELEASE_TABLET | Freq: Every day | ORAL | Status: DC
  Administered 2021-07-07 – 2021-07-09 (×3): 81 mg via ORAL
  Filled 2021-07-06 (×3): qty 1

## 2021-07-06 MED ORDER — LORAZEPAM 2 MG/ML IJ SOLN: 4.0000 mg | INTRAMUSCULAR | Status: DC | PRN

## 2021-07-06 MED ORDER — TIZANIDINE HCL 2 MG PO TABS
2.0000 mg | ORAL_TABLET | Freq: Four times a day (QID) | ORAL | Status: DC | PRN
  Administered 2021-07-06 – 2021-07-08 (×3): 2 mg via ORAL
  Filled 2021-07-06 (×3): qty 1

## 2021-07-06 MED ORDER — SODIUM CHLORIDE 0.9 % IV SOLN
75.0000 mL/h | INTRAVENOUS | Status: DC
  Administered 2021-07-06 – 2021-07-08 (×2): 75 mL/h via INTRAVENOUS

## 2021-07-06 MED ORDER — LISINOPRIL 5 MG PO TABS
2.5000 mg | ORAL_TABLET | Freq: Every day | ORAL | Status: DC
  Administered 2021-07-07 – 2021-07-09 (×3): 2.5 mg via ORAL
  Filled 2021-07-06 (×3): qty 1

## 2021-07-06 MED ORDER — INSULIN ASPART 100 UNIT/ML IJ SOLN
0.0000 [IU] | Freq: Three times a day (TID) | INTRAMUSCULAR | Status: DC
  Administered 2021-07-07: 1 [IU] via SUBCUTANEOUS
  Administered 2021-07-08 – 2021-07-09 (×2): 2 [IU] via SUBCUTANEOUS

## 2021-07-06 MED ORDER — GUAIFENESIN ER 600 MG PO TB12
600.0000 mg | ORAL_TABLET | Freq: Two times a day (BID) | ORAL | Status: DC
  Administered 2021-07-06 – 2021-07-09 (×6): 600 mg via ORAL
  Filled 2021-07-06 (×6): qty 1

## 2021-07-06 MED ORDER — ATORVASTATIN CALCIUM 20 MG PO TABS
20.0000 mg | ORAL_TABLET | Freq: Every evening | ORAL | Status: DC
  Administered 2021-07-07 – 2021-07-08 (×2): 20 mg via ORAL
  Filled 2021-07-06 (×2): qty 1

## 2021-07-06 MED ORDER — ASPIRIN EC 81 MG PO TBEC: 81.0000 mg | DELAYED_RELEASE_TABLET | Freq: Every day | ORAL | Status: DC

## 2021-07-06 MED ORDER — ALBUTEROL SULFATE (2.5 MG/3ML) 0.083% IN NEBU: 3.0000 mL | INHALATION_SOLUTION | RESPIRATORY_TRACT | Status: DC | PRN

## 2021-07-06 MED ORDER — LEVETIRACETAM IN NACL 1000 MG/100ML IV SOLN
1000.0000 mg | Freq: Once | INTRAVENOUS | Status: AC
  Administered 2021-07-06: 1000 mg via INTRAVENOUS
  Filled 2021-07-06: qty 100

## 2021-07-06 NOTE — H&P (Signed)
History and Physical  [name] AYT:[id number] DOB: [birth year] DOA: 07/06/2021  Referring physician: Dr. Sabra Heck, ED physician PCP: Curlene Labrum, MD  Outpatient Specialists:   Patient Coming From: [facility]  Chief Complaint: Encephalopathy  HPI: [name] is a 85 y.o. female with a history of upper cervical spinal injury with quadriplegia, diabetes with peripheral neuropathy, hypertension, hyperlipidemia, GERD.  Patient is at the [facility].  Due to confusion and unresponsiveness, history is obtained from the daughter.  Patient was at her baseline earlier today and was eating.  After eating a few bites of oatmeal, the patient was noted to be staring off at a clock and unresponsive.  She then had a couple of brief shaking episodes of her upper extremities and then went completely unresponsive.  Since that time, she has been fairly unresponsive and lethargic.  She has been able to mumble a few words including that her abdomen hurts and that she knew where she was.  No palliating or provoking factors.  Patient's daughter does report that she has a history of seizures in the past, but is not on antiepileptic.  Emergency Department Course: Head CT negative.  Patient discussed with neurologist who recommended treatment with Keppra and an MRI tomorrow.  126.  Creatinine 1.09.  White count normal.  Patient does have an indwelling catheter and has WBCs although does not have any bacteria.  Patient was given a gram of Rocephin and urine cultured.  Review of Systems:  Unable to provide   Past Medical History:  Diagnosis Date   DM type 2 with diabetic peripheral neuropathy ([place])    High cholesterol    Neck pain    Neuropathy    Past Surgical History:  Procedure Laterality Date   ABDOMINAL HYSTERECTOMY     ANTERIOR CERVICAL DECOMP/DISCECTOMY FUSION N/A 02/05/2021   Procedure: Cervical Three-Four  Anterior cervical decompression/discectomy/fusion;  Surgeon: Ashok Pall, MD;   Location: [facility];  Service: Neurosurgery;  Laterality: N/A;  RM 20   APPENDECTOMY     BACK SURGERY     CERVICAL DISC SURGERY     HAND SURGERY     KNEE SURGERY     Social History:  reports that she has never smoked. She has never used smokeless tobacco. She reports that she does not drink alcohol and does not use drugs. Patient lives at [facility] center  Allergies  Allergen Reactions   Codeine    Sulfa Antibiotics     Family History  Problem Relation Age of Onset   Cardiomyopathy Father    Cancer Mother       Prior to Admission medications   Medication Sig Start Date End Date Taking? Authorizing Provider  acetaminophen (TYLENOL) 325 MG tablet Take 650 mg by mouth every 8 (eight) hours.   Yes [provider]  albuterol (VENTOLIN HFA) 108 (90 Base) MCG/ACT inhaler Inhale 1 puff into the lungs every 4 (four) hours as needed for wheezing or shortness of breath.   Yes [provider]  Artificial Saliva (BIOTENE MOISTURIZING MOUTH MT) Use as directed 1 application in the mouth or throat at bedtime. 9 pm   Yes [provider]  aspirin EC 81 MG tablet Take 81 mg by mouth daily. Swallow whole.9 am   Yes [provider]  atorvastatin (LIPITOR) 20 MG tablet Take 1 tablet (20 mg total) by mouth every evening. 12/20/20  Yes Gerlene Fee, NP  Balsam [place]-Castor Oil [facility]) OINT Apply topically. Apply to sacrum, coccyx,  bilateral buttocks qshift for prevention.   Yes [provider]  chlorthalidone (HYGROTON) 25 MG tablet Take 25 mg by mouth daily. 01/01/21  Yes [provider]  colchicine 0.6 MG tablet Take 0.6 mg by mouth daily as needed.   Yes [provider]  DULoxetine (CYMBALTA) 60 MG capsule Take 60 mg by mouth daily.   Yes [provider]  insulin glargine (LANTUS) 100 UNIT/ML injection Inject 20 Units into the skin at bedtime.   Yes [provider]  insulin lispro (HUMALOG) 100 UNIT/ML KwikPen Inject 5 Units into  the skin 2 (two) times daily with a meal.   Yes [provider]  lisinopril (ZESTRIL) 2.5 MG tablet Take 2.5 mg by mouth daily.   Yes [provider]  melatonin 3 MG TABS tablet Take 3 mg by mouth at bedtime.   Yes [provider]  metoCLOPramide (REGLAN) 5 MG tablet Take 5 mg by mouth 4 (four) times daily. 1/2 tablet 2.5mg  before meals and at bedtime   Yes [provider]  mirtazapine (REMERON) 7.5 MG tablet Take 7.5 mg by mouth at bedtime. 01/01/21  Yes [provider]  omeprazole (PRILOSEC) 40 MG capsule Take 40 mg by mouth daily.   Yes [provider]  prochlorperazine (COMPAZINE) 10 MG tablet Take 10 mg by mouth every 6 (six) hours as needed for nausea or vomiting.   Yes [provider]  rOPINIRole (REQUIP) 1 MG tablet Take 1 mg by mouth at bedtime.   Yes [provider]  tiZANidine (ZANAFLEX) 2 MG tablet Take 2 mg by mouth every 6 (six) hours as needed for muscle spasms (for back and sciatic pain).   Yes [provider]  guaiFENesin (MUCINEX) 600 MG 12 hr tablet Take by mouth 2 (two) times daily.    [provider]  mirtazapine (REMERON) 15 MG tablet Take 15 mg by mouth at bedtime. 05/27/21   [provider]  NON FORMULARY Diet: NAS, Cons CHO    [provider]    Physical Exam: BP (!) 149/57   Pulse 64   Temp 97.6 F (36.4 C) (Oral)   Resp 14   SpO2 97%   General: Elderly female.  Somewhat lethargic. No acute cardiopulmonary distress.  HEENT: Normocephalic atraumatic.  Right and left ears normal in appearance.  Pupils equal, round, reactive to light. Extraocular muscles are intact. Sclerae anicteric and noninjected.  Moist mucosal membranes. No mucosal lesions.  Neck: Neck supple without lymphadenopathy. No carotid bruits. No masses palpated.  Cardiovascular: Regular rate with normal S1-S2 sounds. No murmurs, rubs, gallops auscultated. No JVD.  Respiratory: Good respiratory effort  with no wheezes, rales, rhonchi. Lungs clear to auscultation bilaterally.  No accessory muscle use. Abdomen: Diffuse tenderness.  Soft.  No rebound or guarding.. Active bowel sounds. No masses or hepatosplenomegaly  Skin: No rashes, lesions, or ulcerations.  Dry, warm to touch. 2+ dorsalis pedis and radial pulses. Musculoskeletal: No calf or leg pain. All major joints not erythematous nontender.  No upper or lower joint deformation.  Good ROM.  No contractures  Psychiatric: Intact judgment and insight. Pleasant and cooperative. Neurologic: No focal neurological deficits. Strength is 5/5 and symmetric in upper and lower extremities.  Cranial nerves II through XII are grossly intact.           Labs on Admission: I have personally reviewed following labs and imaging studies  CBC: Recent Labs  Lab 07/06/21 1602  WBC 7.6  NEUTROABS 4.4  HGB  9.7*  HCT 29.8*  MCV 89.2  PLT 182*   Basic Metabolic Panel: Recent Labs  Lab 07/06/21 1602  NA 126*  K 5.3*  CL 92*  CO2 23  GLUCOSE 92  BUN 44*  CREATININE 1.09*  CALCIUM 9.4   GFR: Estimated Creatinine Clearance: 36.4 mL/min (A) (by C-G formula based on SCr of 1.09 mg/dL (H)). Liver Function Tests: Recent Labs  Lab 07/06/21 1602  AST 16  ALT 24  ALKPHOS 98  BILITOT 0.3  PROT 7.7  ALBUMIN 3.4*   No results for input(s): LIPASE, AMYLASE in the last 168 hours. No results for input(s): AMMONIA in the last 168 hours. Coagulation Profile: Recent Labs  Lab 07/06/21 1602  INR 1.1   Cardiac Enzymes: No results for input(s): CKTOTAL, CKMB, CKMBINDEX, TROPONINI in the last 168 hours. BNP (last 3 results) No results for input(s): PROBNP in the last 8760 hours. HbA1C: No results for input(s): HGBA1C in the last 72 hours. CBG: No results for input(s): GLUCAP in the last 168 hours. Lipid Profile: No results for input(s): CHOL, HDL, LDLCALC, TRIG, CHOLHDL, LDLDIRECT in the last 72 hours. Thyroid Function Tests: No results for  input(s): TSH, T4TOTAL, FREET4, T3FREE, THYROIDAB in the last 72 hours. Anemia Panel: No results for input(s): VITAMINB12, FOLATE, FERRITIN, TIBC, IRON, RETICCTPCT in the last 72 hours. Urine analysis:    Component Value Date/Time   COLORURINE YELLOW 07/06/2021 1615   APPEARANCEUR HAZY (A) 07/06/2021 1615   LABSPEC 1.015 07/06/2021 1615   PHURINE 7.0 07/06/2021 1615   GLUCOSEU NEGATIVE 07/06/2021 1615   HGBUR NEGATIVE 07/06/2021 1615   BILIRUBINUR NEGATIVE 07/06/2021 1615   KETONESUR NEGATIVE 07/06/2021 1615   PROTEINUR 30 (A) 07/06/2021 1615   NITRITE NEGATIVE 07/06/2021 1615   LEUKOCYTESUR LARGE (A) 07/06/2021 1615   Sepsis Labs: @LABRCNTIP (procalcitonin:4,lacticidven:4) )No results found for this or any previous visit (from the past 240 hour(s)).   Radiological Exams on Admission: CT Head Wo Contrast  Result Date: 07/06/2021 CLINICAL DATA:  Altered mental status.  Possible seizure activity. EXAM: CT HEAD WITHOUT CONTRAST TECHNIQUE: Contiguous axial images were obtained from the base of the skull through the vertex without intravenous contrast. COMPARISON:  01/25/2021 FINDINGS: Brain: No evidence of acute infarction, hemorrhage, hydrocephalus, extra-axial collection, or mass lesion/mass effect. Vascular:  No hyperdense vessel or other acute findings. Skull: No evidence of fracture or other significant bone abnormality. Sinuses/Orbits: No acute findings. Mucous retention cysts or polyps are again seen in both maxillary sinuses. Other: None. IMPRESSION: No intracranial abnormality or other acute findings. Chronic bilateral maxillary sinus disease. Electronically Signed   By: Marlaine Hind M.D.   On: 07/06/2021 16:04    EKG: pending  Assessment/Plan: Active Problems:   Essential (primary) hypertension   Type 2 diabetes mellitus with diabetic neuropathy, unspecified (HCC)   GERD without esophagitis   CKD (chronic kidney disease), stage III (HCC)   Urinary retention   Central cord  syndrome at C4 level of cervical spinal cord (HCC)   CKD stage 3 due to type 2 diabetes mellitus ([place])   Acute encephalopathy    This patient was discussed with the ED physician, including pertinent vitals, physical exam findings, labs, and imaging.  We also discussed care given by the ED provider.  Encephalopathy Of whether this is postictal symptoms or whether there is an underlying infection. Will load with Keppra and continue MRI tomorrow Check urine culture Abdominal pain Uncertain of etiology CT abdomen pelvis with contrast Will start IV Reglan Hypertension Continue  antihypertensives Diabetes Continue antidiabetes medications Stage III chronic kidney disease Central cord syndrome  DVT prophylaxis: Lovenox Consultants: Neurology Code Status: DNR confirmed with family Family Communication: Patient's daughter Disposition Plan: Pending   Truett Mainland, DO

## 2021-07-06 NOTE — ED Provider Notes (Signed)
[facility] Provider Note   CSN: [id number] Arrival date & time: 07/06/21  1257     History Chief Complaint  Patient presents with   Seizures    [name] is a 85 y.o. female.   Seizures  This patient is a 85 year old female presenting from nursing facility, she has a known history of diabetes, she has a history of neuropathy and neck pain status post cervical spine surgery, she has chronic kidney disease and currently has failure to thrive which has caused her to be somewhat inability to walk in fact she has been diagnosed as quadriplegic as an incomplete secondary to the neck problems that she has had in the past.  Her baseline according to her daughter who is the primary historian is that she is able to talk and interact but has no ability to walk, she usually is very awake and alert and has no dementia.  While she was feeding her at the nursing facility while the patient was in her wheelchair today she noticed that she was staring off at a clock and was not responding, she had some brief shaking episodes and then went completely unresponsive.  Since that time she has continued to be unresponsive or less responsive and lethargic.  She was her normal self when she saw her this morning.  The family member does report that she had a history of seizures in the past, review of the medical record shows that the patient currently takes the following medications  Lisinopril, Reglan, ropinirole, Zanaflex, insulin, omeprazole, chlorthalidone, mirtazapine and atorvastatin.  There is no medications for seizures per se  Past Medical History:  Diagnosis Date   DM type 2 with diabetic peripheral neuropathy ([place])    High cholesterol    Neck pain    Neuropathy     Patient Active Problem List   Diagnosis Date Noted   Nausea in adult 06/24/2021   Hypertension associated with stage 3a chronic kidney disease due to type 2 diabetes mellitus ([place]) 06/24/2021   CKD stage 3  due to type 2 diabetes mellitus ([place]) 06/19/2021   Gastritis, acute 06/18/2021   Adult failure to thrive syndrome 06/17/2021   Left shoulder pain 06/12/2021   Major depression, recurrent, chronic ([place]) 06/02/2021   Essential (primary) hypertension 05/27/2021   RLS (restless legs syndrome) 04/21/2021   Elevated blood-pressure reading, without diagnosis of hypertension 03/25/2021   Type 2 diabetes mellitus with stage 3 chronic kidney disease, with long-term current use of insulin ([place]) 03/12/2021   Shoulder impingement syndrome 02/18/2021   Quadriplegia, C1-C4 incomplete ([place]) 02/17/2021   Chronic anxiety 02/17/2021   Protein-calorie malnutrition, severe ([place]) 02/17/2021   Aortic atherosclerosis ([place]) 02/17/2021   DNR (do not resuscitate) 02/12/2021   Central cord syndrome at C4 level of cervical spinal cord ([place]) 02/05/2021   Urinary retention 01/31/2021   Cord compression ([place]) 01/29/2021   Acute lower UTI 01/29/2021   Hyponatremia 01/29/2021   Closed fracture of proximal end of left fibula with routine healing 12/19/2020   CKD (chronic kidney disease), stage III ([place]) 12/11/2020   Hyperlipidemia associated with type 2 diabetes mellitus ([place]) 12/05/2020   Type 2 diabetes mellitus with diabetic neuropathy, unspecified ([place]) 12/05/2020   GERD without esophagitis 12/05/2020   Chronic constipation 12/05/2020   Diabetic peripheral neuropathy ([place]) 12/05/2020   Avascular necrosis of bones of both hips ([place]) 12/05/2020   Acute kidney injury superimposed on CKD ([facility])    Normocytic anemia    Bilateral sensorineural hearing  loss 05/02/2020   Spinal stenosis in cervical region 01/27/2019   Degeneration of lumbar intervertebral disc 12/12/2018    Past Surgical History:  Procedure Laterality Date   ABDOMINAL HYSTERECTOMY     ANTERIOR CERVICAL DECOMP/DISCECTOMY FUSION N/A 02/05/2021   Procedure: Cervical Three-Four  Anterior cervical decompression/discectomy/fusion;  Surgeon: Ashok Pall,  MD;  Location: [facility];  Service: Neurosurgery;  Laterality: N/A;  RM 20   APPENDECTOMY     BACK SURGERY     CERVICAL DISC SURGERY     HAND SURGERY     KNEE SURGERY       OB History   No obstetric history on file.     Family History  Problem Relation Age of Onset   Cardiomyopathy Father    Cancer Mother     Social History   Tobacco Use   Smoking status: Never   Smokeless tobacco: Never  Vaping Use   Vaping Use: Never used  Substance Use Topics   Alcohol use: Never   Drug use: Never    Home Medications Prior to Admission medications   Medication Sig Start Date End Date Taking? Authorizing Provider  acetaminophen (TYLENOL) 325 MG tablet Take 650 mg by mouth every 8 (eight) hours.   Yes [provider]  albuterol (VENTOLIN HFA) 108 (90 Base) MCG/ACT inhaler Inhale 1 puff into the lungs every 4 (four) hours as needed for wheezing or shortness of breath.   Yes [provider]  Artificial Saliva (BIOTENE MOISTURIZING MOUTH MT) Use as directed 1 application in the mouth or throat at bedtime. 9 pm   Yes [provider]  aspirin EC 81 MG tablet Take 81 mg by mouth daily. Swallow whole.9 am   Yes [provider]  atorvastatin (LIPITOR) 20 MG tablet Take 1 tablet (20 mg total) by mouth every evening. 12/20/20  Yes Gerlene Fee, NP  Balsam [place]-Castor Oil [facility]) OINT Apply topically. Apply to sacrum, coccyx, bilateral buttocks qshift for prevention.   Yes [provider]  chlorthalidone (HYGROTON) 25 MG tablet Take 25 mg by mouth daily. 01/01/21  Yes [provider]  colchicine 0.6 MG tablet Take 0.6 mg by mouth daily as needed.   Yes [provider]  DULoxetine (CYMBALTA) 60 MG capsule Take 60 mg by mouth daily.   Yes [provider]  insulin glargine (LANTUS) 100 UNIT/ML injection Inject 20 Units into the skin at bedtime.   Yes [provider]  insulin lispro (HUMALOG) 100 UNIT/ML KwikPen Inject 5  Units into the skin 2 (two) times daily with a meal.   Yes [provider]  lisinopril (ZESTRIL) 2.5 MG tablet Take 2.5 mg by mouth daily.   Yes [provider]  melatonin 3 MG TABS tablet Take 3 mg by mouth at bedtime.   Yes [provider]  metoCLOPramide (REGLAN) 5 MG tablet Take 5 mg by mouth 4 (four) times daily. 1/2 tablet 2.5mg  before meals and at bedtime   Yes [provider]  mirtazapine (REMERON) 7.5 MG tablet Take 7.5 mg by mouth at bedtime. 01/01/21  Yes [provider]  omeprazole (PRILOSEC) 40 MG capsule Take 40 mg by mouth daily.   Yes [provider]  prochlorperazine (COMPAZINE) 10 MG tablet Take 10 mg by mouth every 6 (six) hours as needed for nausea or vomiting.   Yes [provider]  rOPINIRole (REQUIP) 1 MG tablet Take 1 mg by mouth at bedtime.   Yes [provider]  tiZANidine (ZANAFLEX)  2 MG tablet Take 2 mg by mouth every 6 (six) hours as needed for muscle spasms (for back and sciatic pain).   Yes [provider]  guaiFENesin (MUCINEX) 600 MG 12 hr tablet Take by mouth 2 (two) times daily.    [provider]  mirtazapine (REMERON) 15 MG tablet Take 15 mg by mouth at bedtime. 05/27/21   [provider]  NON FORMULARY Diet: NAS, Cons CHO    [provider]    Allergies    Codeine and Sulfa antibiotics  Review of Systems   Review of Systems  Unable to perform ROS: Mental status change  Neurological:  Positive for seizures.   Physical Exam Updated Vital Signs BP (!) 149/57   Pulse 64   Temp 97.6 F (36.4 C) (Oral)   Resp 14   SpO2 97%   Physical Exam Vitals and nursing note reviewed.  Constitutional:      General: She is not in acute distress.    Appearance: She is well-developed.     Comments: The patient is somnolent to obtunded but arousable to voice  HENT:     Head: Normocephalic and atraumatic.     Nose: Nose normal.     Mouth/Throat:     Mouth:  Mucous membranes are moist.     Pharynx: No oropharyngeal exudate.  Eyes:     General: No scleral icterus.       Right eye: No discharge.        Left eye: No discharge.     Conjunctiva/sclera: Conjunctivae normal.     Pupils: Pupils are equal, round, and reactive to light.  Neck:     Thyroid: No thyromegaly.     Vascular: No JVD.  Cardiovascular:     Rate and Rhythm: Normal rate and regular rhythm.     Heart sounds: Normal heart sounds. No murmur heard.   No friction rub. No gallop.  Pulmonary:     Effort: Pulmonary effort is normal. No respiratory distress.     Breath sounds: Normal breath sounds. No wheezing or rales.  Abdominal:     General: Bowel sounds are normal. There is no distension.     Palpations: Abdomen is soft. There is no mass.     Tenderness: There is abdominal tenderness.     Comments: Mild diffuse tenderness, soft but guarding  Musculoskeletal:        General: No tenderness. Normal range of motion.     Cervical back: Normal range of motion and neck supple.     Right lower leg: No edema.     Left lower leg: No edema.  Lymphadenopathy:     Cervical: No cervical adenopathy.  Skin:    General: Skin is warm and dry.     Findings: No erythema or rash.  Neurological:     Coordination: Coordination normal.     Comments: The patient is unable to lift either leg more than 1 inch off the bed, she appears significantly weak in all 4 extremities but is able to lift both arms off of the bed, this is very slow in fact all of her physical movements are significantly retarded in speed.  When I asked the patient to count my fingers she gets it wrong every time but is very slow to answer  Psychiatric:        Behavior: Behavior normal.    ED Results / Procedures / Treatments   Labs (all labs ordered are listed, but only abnormal results  are displayed) Labs Reviewed  CBC WITH DIFFERENTIAL/PLATELET - Abnormal; Notable for the following components:      Result Value   RBC  3.34 (*)    Hemoglobin 9.7 (*)    HCT 29.8 (*)    Platelets 409 (*)    All other components within normal limits  COMPREHENSIVE METABOLIC PANEL - Abnormal; Notable for the following components:   Sodium 126 (*)    Potassium 5.3 (*)    Chloride 92 (*)    BUN 44 (*)    Creatinine, Ser 1.09 (*)    Albumin 3.4 (*)    GFR, Estimated 51 (*)    All other components within normal limits  URINALYSIS, ROUTINE W REFLEX MICROSCOPIC - Abnormal; Notable for the following components:   APPearance HAZY (*)    Protein, ur 30 (*)    Leukocytes,Ua LARGE (*)    WBC, UA >50 (*)    All other components within normal limits  URINE CULTURE  PROTIME-INR    EKG None  Radiology CT Head Wo Contrast  Result Date: 07/06/2021 CLINICAL DATA:  Altered mental status.  Possible seizure activity. EXAM: CT HEAD WITHOUT CONTRAST TECHNIQUE: Contiguous axial images were obtained from the base of the skull through the vertex without intravenous contrast. COMPARISON:  01/25/2021 FINDINGS: Brain: No evidence of acute infarction, hemorrhage, hydrocephalus, extra-axial collection, or mass lesion/mass effect. Vascular:  No hyperdense vessel or other acute findings. Skull: No evidence of fracture or other significant bone abnormality. Sinuses/Orbits: No acute findings. Mucous retention cysts or polyps are again seen in both maxillary sinuses. Other: None. IMPRESSION: No intracranial abnormality or other acute findings. Chronic bilateral maxillary sinus disease. Electronically Signed   By: Marlaine Hind M.D.   On: 07/06/2021 16:04    Procedures Procedures   Medications Ordered in ED Medications  cefTRIAXone (ROCEPHIN) 1 g in sodium chloride 0.9 % 100 mL IVPB (1 g Intravenous New Bag/Given 07/06/21 1706)  0.9 %  sodium chloride infusion ( Intravenous New Bag/Given 07/06/21 1705)    ED Course  I have reviewed the triage vital signs and the nursing notes.  Pertinent labs & imaging results that were available during my care of  the patient were reviewed by me and considered in my medical decision making (see chart for details).    MDM Rules/Calculators/A&P                           He does not clear what is causing the patient's altered mental status, would consider 1 of numerous different etiologies including possible seizures, possible stroke, possible intracranial hemorrhage, would also consider infectious etiologies, cardiac etiologies though they seem less likely.  Her vital signs are rather unremarkable except for hypotension.  We will proceed with labs and CT scan of the brain.  The patient does have a DO NOT RESUSCITATE orders that accompany her from the nursing facility.  CT head negative Labs with low Na at 126, WBC normal UA with WBC but no bacteria  D/w Dr. Nehemiah Settle who will admit Will d/w Neuro to get recommendations on Keppra et al.  Final Clinical Impression(s) / ED Diagnoses Final diagnoses:  Seizures ([place])  Hyponatremia    Rx / DC Orders ED Discharge Orders     None        Noemi Chapel, MD 07/06/21 [phone]

## 2021-07-06 NOTE — ED Triage Notes (Signed)
Pt had twitching that lasted a few seconds and then became less responsive. Pt lethargic on arrival.

## 2021-07-07 ENCOUNTER — Inpatient Hospital Stay (HOSPITAL_COMMUNITY): Payer: Medicare HMO

## 2021-07-07 DIAGNOSIS — L8990 Pressure ulcer of unspecified site, unspecified stage: Secondary | ICD-10-CM | POA: Insufficient documentation

## 2021-07-07 DIAGNOSIS — G9340 Encephalopathy, unspecified: Secondary | ICD-10-CM | POA: Diagnosis not present

## 2021-07-07 LAB — GLUCOSE, CAPILLARY
Glucose-Capillary: 68 mg/dL — ABNORMAL LOW (ref 70–99)
Glucose-Capillary: 118 mg/dL — ABNORMAL HIGH (ref 70–99)
Glucose-Capillary: 125 mg/dL — ABNORMAL HIGH (ref 70–99)
Glucose-Capillary: 75 mg/dL (ref 70–99)

## 2021-07-07 LAB — CBC
HCT: 26.1 % — ABNORMAL LOW (ref 36.0–46.0)
Hemoglobin: 8.5 g/dL — ABNORMAL LOW (ref 12.0–15.0)
MCH: 29.7 pg (ref 26.0–34.0)
MCHC: 32.6 g/dL (ref 30.0–36.0)
MCV: 91.3 fL (ref 80.0–100.0)
Platelets: 346 10*3/uL (ref 150–400)
RBC: 2.86 MIL/uL — ABNORMAL LOW (ref 3.87–5.11)
RDW: 14 % (ref 11.5–15.5)
WBC: 5.2 10*3/uL (ref 4.0–10.5)
nRBC: 0 % (ref 0.0–0.2)

## 2021-07-07 LAB — BASIC METABOLIC PANEL WITH GFR
Anion gap: 7 (ref 5–15)
BUN: 36 mg/dL — ABNORMAL HIGH (ref 8–23)
CO2: 19 mmol/L — ABNORMAL LOW (ref 22–32)
Calcium: 8.4 mg/dL — ABNORMAL LOW (ref 8.9–10.3)
Chloride: 100 mmol/L (ref 98–111)
Creatinine, Ser: 1.01 mg/dL — ABNORMAL HIGH (ref 0.44–1.00)
GFR, Estimated: 56 mL/min — ABNORMAL LOW (ref >60)
Glucose, Bld: 156 mg/dL — ABNORMAL HIGH (ref 70–99)
Potassium: 5 mmol/L (ref 3.5–5.1)
Sodium: 126 mmol/L — ABNORMAL LOW (ref 135–145)

## 2021-07-07 LAB — HEMOGLOBIN A1C
Hgb A1c MFr Bld: 8.3 % — ABNORMAL HIGH (ref 4.8–5.6)
Mean Plasma Glucose: 191.51 mg/dL

## 2021-07-07 MED ORDER — CHLORHEXIDINE GLUCONATE CLOTH 2 % EX PADS
6.0000 | MEDICATED_PAD | Freq: Every day | CUTANEOUS | Status: DC
  Administered 2021-07-07 – 2021-07-09 (×3): 6 via TOPICAL

## 2021-07-07 MED ORDER — SENNOSIDES-DOCUSATE SODIUM 8.6-50 MG PO TABS
2.0000 | ORAL_TABLET | Freq: Two times a day (BID) | ORAL | Status: DC
  Administered 2021-07-07 – 2021-07-09 (×4): 2 via ORAL
  Filled 2021-07-07 (×4): qty 2

## 2021-07-07 MED ORDER — BISACODYL 10 MG RE SUPP: 10.0000 mg | Freq: Every day | RECTAL | Status: DC | PRN

## 2021-07-07 MED ORDER — GADOBUTROL 1 MMOL/ML IV SOLN
7.0000 mL | Freq: Once | INTRAVENOUS | Status: AC | PRN
  Administered 2021-07-07: 7 mL via INTRAVENOUS

## 2021-07-07 NOTE — Progress Notes (Signed)
Inpatient Diabetes Program Recommendations  AACE/ADA: New Consensus Statement on Inpatient Glycemic Control   Target Ranges:  Prepandial:   less than 140 mg/dL      Peak postprandial:   less than 180 mg/dL (1-2 hours)      Critically ill patients:  140 - 180 mg/dL   Results for [name], [name] (MRN [id number]) as of 07/07/2021 09:54  Ref. Range 07/06/2021 22:14 07/07/2021 07:19  Glucose-Capillary Latest Ref Range: 70 - 99 mg/dL 96 68 (L)   Review of Glycemic Control  Diabetes history: DM2 Outpatient Diabetes medications: Lantus 20 units QHS, Humalog 5 units BID with meals Current orders for Inpatient glycemic control: Semglee 20 units QHS, Novolog 5 units BID with meals, Novolog 0-9 units TID with meals, Novolog 0-5 units QHS  Inpatient Diabetes Program Recommendations:    Insulin: Fasting glucose 68 mg/dl today. Please consider decreasing Semglee to 15 units QHS.  Thanks, Barnie Alderman, RN, MSN, CDE Diabetes Coordinator Inpatient Diabetes Program [phone] (Team Pager from 8am to 5pm)

## 2021-07-07 NOTE — Progress Notes (Signed)
PROGRESS NOTE  [name] LYY:[id number] DOB: 1936-06-24 DOA: 07/06/2021 PCP: Curlene Labrum, MD  HPI/Recap of past 24 hours: [name] is a 85 y.o. female with a history of upper cervical spinal injury with quadriplegia, type II diabetes with peripheral neuropathy, hypertension, hyperlipidemia, GERD who presented from [facility] to [facility] ED, due to confusion and unresponsiveness.  Patient was at her baseline earlier on 07/06/2021 and was eating.  After eating a few bites of oatmeal, the patient was noted to be staring off at a clock and unresponsive.  She then had a couple of brief shaking episodes of her upper extremities and then went completely unresponsive.  No prior history of seizures.  Upon presentation to the ED, head CT negative, neurology recommended treatment with Keppra and an MRI brain.  07/07/2021: Patient was seen and examined at her bedside.  Her daughter was present in the room.  She was alert and interactive, answering questions appropriately.  She reported numbness in her lower extremities bilaterally which has been ongoing for months.  Also reported some discomfort in her throat that has been present for weeks.   Assessment/Plan: Active Problems:   Essential (primary) hypertension   Type 2 diabetes mellitus with diabetic neuropathy, unspecified (HCC)   GERD without esophagitis   CKD (chronic kidney disease), stage III (HCC)   Urinary retention   Central cord syndrome at C4 level of cervical spinal cord (HCC)   CKD stage 3 due to type 2 diabetes mellitus ([place])   Acute encephalopathy  Acute metabolic encephalopathy, likely contributed by UTI, rule out seizures versus underlying neurological structural abnormality. Weakness upper extremity shaking episodes then unresponsiveness Keppra was recommended by cardiology which was started on admission. CT head was unrevealing. MRI brain ordered and pending. Mentation appears to be improved today Reorient as  needed  Presumptive UTI, POA UA positive for pyuria Follow urine culture for ID and sensitivity Continue Rocephin started on admission  Chronic hyponatremia with hypovolemia Serum sodium 126 Continue normal saline at 75 cc/h Repeat BMP in the morning  Resolved hyperkalemia Potassium improved 5.0 from 5.3 Continue to monitor  Restless leg syndrome Continue home ropinirole  Type 2 diabetes with hyperglycemia Hemoglobin A1c 8.3 on 07/06/2021 Continue insulin sliding scale.  Throat discomfort, unclear if she has dysphagia Speech therapist to assess  Chronic anxiety/depression Continue home duloxetine    Code Status: DNR  Family Communication: Daughter at bedside  Disposition Plan: Likely will return to SNF after brain MRI, possibly on 07/08/2021.   Consultants: Neurology contacted by EDP.  Procedures: None  Antimicrobials: Rocephin  DVT prophylaxis: Subcu Lovenox daily.  Status is: Inpatient  Inpatient status.  Patient will require at least 2 midnights for further evaluation and treatment of present condition.      Objective: Vitals:   07/06/21 2100 07/06/21 2211 07/07/21 0201 07/07/21 0604  BP: (!) 132/54 (!) 156/58 (!) 108/47 100/62  Pulse: 64 74 64 63  Resp: _0 Temp:  97.8 F (36.6 C) 97.8 F (36.6 C) 98.5 F (36.9 C)  TempSrc:  Oral Oral Oral  SpO2: 97% 100% 97% 99%    Intake/Output Summary (Last 24 hours) at 07/07/2021 1323 Last data filed at 07/07/2021 0900 Gross per 24 hour  Intake 1207.75 ml  Output 900 ml  Net 307.75 ml   There were no vitals filed for this visit.  Exam:  General: 85 y.o. year-old female well developed well nourished in no acute distress.  Alert and  interactive. Cardiovascular: Regular rate and rhythm with no rubs or gallops.  No thyromegaly or JVD noted.   Respiratory: Clear to auscultation with no wheezes or rales. Good inspiratory effort. Abdomen: Soft nontender nondistended with normal bowel sounds  x4 quadrants. Musculoskeletal: Trace lower extremity edema. 2/4 pulses in all 4 extremities. Skin: No ulcerative lesions noted or rashes, Psychiatry: Mood is appropriate for condition and setting Neurology: Motor weakness involving bilateral lower extremities.  Sensation is intact.    Data Reviewed: CBC: Recent Labs  Lab 07/06/21 1602 07/07/21 1033  WBC 7.6 5.2  NEUTROABS 4.4  --   HGB 9.7* 8.5*  HCT 29.8* 26.1*  MCV 89.2 91.3  PLT 409* 384   Basic Metabolic Panel: Recent Labs  Lab 07/06/21 1602 07/07/21 1033  NA 126* 126*  K 5.3* 5.0  CL 92* 100  CO2 23 19*  GLUCOSE 92 156*  BUN 44* 36*  CREATININE 1.09* 1.01*  CALCIUM 9.4 8.4*   GFR: Estimated Creatinine Clearance: 39.3 mL/min (A) (by C-G formula based on SCr of 1.01 mg/dL (H)). Liver Function Tests: Recent Labs  Lab 07/06/21 1602  AST 16  ALT 24  ALKPHOS 98  BILITOT 0.3  PROT 7.7  ALBUMIN 3.4*   No results for input(s): LIPASE, AMYLASE in the last 168 hours. No results for input(s): AMMONIA in the last 168 hours. Coagulation Profile: Recent Labs  Lab 07/06/21 1602  INR 1.1   Cardiac Enzymes: No results for input(s): CKTOTAL, CKMB, CKMBINDEX, TROPONINI in the last 168 hours. BNP (last 3 results) No results for input(s): PROBNP in the last 8760 hours. HbA1C: Recent Labs    07/06/21 1602  HGBA1C 8.3*   CBG: Recent Labs  Lab 07/06/21 2214 07/07/21 0719 07/07/21 1147  GLUCAP 96 68* 118*   Lipid Profile: No results for input(s): CHOL, HDL, LDLCALC, TRIG, CHOLHDL, LDLDIRECT in the last 72 hours. Thyroid Function Tests: No results for input(s): TSH, T4TOTAL, FREET4, T3FREE, THYROIDAB in the last 72 hours. Anemia Panel: No results for input(s): VITAMINB12, FOLATE, FERRITIN, TIBC, IRON, RETICCTPCT in the last 72 hours. Urine analysis:    Component Value Date/Time   COLORURINE YELLOW 07/06/2021 1615   APPEARANCEUR HAZY (A) 07/06/2021 1615   LABSPEC 1.015 07/06/2021 1615   PHURINE 7.0  07/06/2021 1615   GLUCOSEU NEGATIVE 07/06/2021 1615   HGBUR NEGATIVE 07/06/2021 1615   BILIRUBINUR NEGATIVE 07/06/2021 1615   KETONESUR NEGATIVE 07/06/2021 1615   PROTEINUR 30 (A) 07/06/2021 1615   NITRITE NEGATIVE 07/06/2021 1615   LEUKOCYTESUR LARGE (A) 07/06/2021 1615   Sepsis Labs: _0 (procalcitonin:4,lacticidven:4)  ) Recent Results (from the past 240 hour(s))  Resp Panel by RT-PCR (Flu A&B, Covid) Nasopharyngeal Swab     Status: None   Collection Time: 07/06/21  9:09 PM   Specimen: Nasopharyngeal Swab; Nasopharyngeal(NP) swabs in vial transport medium  Result Value Ref Range Status   SARS Coronavirus 2 by RT PCR NEGATIVE NEGATIVE Final    Comment: (NOTE) SARS-CoV-2 target nucleic acids are NOT DETECTED.  The SARS-CoV-2 RNA is generally detectable in upper respiratory specimens during the acute phase of infection. The lowest concentration of SARS-CoV-2 viral copies this assay can detect is 138 copies/mL. A negative result does not preclude SARS-Cov-2 infection and should not be used as the sole basis for treatment or other patient management decisions. A negative result may occur with  improper specimen collection/handling, submission of specimen other than nasopharyngeal swab, presence of viral mutation(s) within the areas targeted by this assay, and inadequate number of  viral copies(<138 copies/mL). A negative result must be combined with clinical observations, patient history, and epidemiological information. The expected result is Negative.  Fact Sheet for Patients:  EntrepreneurPulse.com.au  Fact Sheet for Healthcare Providers:  IncredibleEmployment.be  This test is no t yet approved or cleared by the [place] FDA and  has been authorized for detection and/or diagnosis of SARS-CoV-2 by FDA under an Emergency Use Authorization (EUA). This EUA will remain  in effect (meaning this test can be used) for the duration of  the COVID-19 declaration under Section 564(b)(1) of the Act, 21 U.S.C.section 360bbb-3(b)(1), unless the authorization is terminated  or revoked sooner.       Influenza A by PCR NEGATIVE NEGATIVE Final   Influenza B by PCR NEGATIVE NEGATIVE Final    Comment: (NOTE) The Xpert Xpress SARS-CoV-2/FLU/RSV plus assay is intended as an aid in the diagnosis of influenza from Nasopharyngeal swab specimens and should not be used as a sole basis for treatment. Nasal washings and aspirates are unacceptable for Xpert Xpress SARS-CoV-2/FLU/RSV testing.  Fact Sheet for Patients: EntrepreneurPulse.com.au  Fact Sheet for Healthcare Providers: IncredibleEmployment.be  This test is not yet approved or cleared by the [place] FDA and has been authorized for detection and/or diagnosis of SARS-CoV-2 by FDA under an Emergency Use Authorization (EUA). This EUA will remain in effect (meaning this test can be used) for the duration of the COVID-19 declaration under Section 564(b)(1) of the Act, 21 U.S.C. section 360bbb-3(b)(1), unless the authorization is terminated or revoked.  Performed at [facility], [address], [place], [place] [address]       Studies: CT Head Wo Contrast  Result Date: 07/06/2021 CLINICAL DATA:  Altered mental status.  Possible seizure activity. EXAM: CT HEAD WITHOUT CONTRAST TECHNIQUE: Contiguous axial images were obtained from the base of the skull through the vertex without intravenous contrast. COMPARISON:  01/25/2021 FINDINGS: Brain: No evidence of acute infarction, hemorrhage, hydrocephalus, extra-axial collection, or mass lesion/mass effect. Vascular:  No hyperdense vessel or other acute findings. Skull: No evidence of fracture or other significant bone abnormality. Sinuses/Orbits: No acute findings. Mucous retention cysts or polyps are again seen in both maxillary sinuses. Other: None. IMPRESSION: No intracranial abnormality or  other acute findings. Chronic bilateral maxillary sinus disease. Electronically Signed   By: Marlaine Hind M.D.   On: 07/06/2021 16:04   MR BRAIN W WO CONTRAST  Result Date: 07/07/2021 CLINICAL DATA:  Possible seizure.  Abnormal neurological exam. EXAM: MRI HEAD WITHOUT AND WITH CONTRAST TECHNIQUE: Multiplanar, multiecho pulse sequences of the brain and surrounding structures were obtained without and with intravenous contrast. CONTRAST:  67m GADAVIST GADOBUTROL 1 MMOL/ML IV SOLN COMPARISON:  Head CT yesterday.  Brain MRI 09/08/2019 FINDINGS: Brain: Diffusion imaging does not show any acute or subacute infarction. Minimal chronic small-vessel ischemic change affects the pons. No focal cerebellar finding. Cerebral hemispheres show old small vessel infarctions of the thalami and mild chronic small-vessel ischemic changes of the hemispheric white matter considering age. No cortical or large vessel territory infarction. No mass lesion, hemorrhage, hydrocephalus or extra-axial collection. Mesial temporal lobes appear symmetric and normal. After contrast administration, no abnormal enhancement occurs. Vascular: Major vessels at the base of the brain show flow. Skull and upper cervical spine: Negative Sinuses/Orbits: Large retention cyst of the left maxillary sinus. No evidence of inflammatory sinus disease. The orbits appear normal. Other: Large left mastoid effusion. IMPRESSION: No acute intracranial finding. No sign of acute infarction. No lesion seen to explain seizure. The  patient does have mild chronic small-vessel ischemic changes considering age affecting the pons, thalami and cerebral hemispheric white matter. Left mastoid effusion. Electronically Signed   By: Nelson Chimes M.D.   On: 07/07/2021 11:36   CT ABDOMEN PELVIS W CONTRAST  Result Date: 07/06/2021 CLINICAL DATA:  Abdominal pain. EXAM: CT ABDOMEN AND PELVIS WITH CONTRAST TECHNIQUE: Multidetector CT imaging of the abdomen and pelvis was performed  using the standard protocol following bolus administration of intravenous contrast. CONTRAST:  164m OMNIPAQUE IOHEXOL 300 MG/ML  SOLN COMPARISON:  CT 2 weeks ago 06/22/2021 FINDINGS: Lower chest: Minimal basilar atelectasis. No confluent consolidation or pleural effusion. Hepatobiliary: Stable peripheral cyst in the liver. No new liver abnormality. There may be slight capsular nodularity. Minimal layering density in the gallbladder. No pericholecystic fat stranding. Normal biliary tree. Pancreas: No ductal dilatation or inflammation. Spleen: Normal in size without focal abnormality. Adrenals/Urinary Tract: Normal adrenal glands. Improvement in bilateral hydronephrosis from prior exam. There is residual prominence of the renal collecting systems and ureters. Heterogeneous enhancement of the left kidney most mild left perinephric edema. No renal fluid collection. No focal renal lesion or stone. Foley catheter within the urinary bladder. There is mild perivesicular fat stranding. Stomach/Bowel: Stool distends the rectum at 7.6 cm. Mild distal rectal wall thickening. Small to moderate volume of colonic stool. Colonic diverticulosis without diverticulitis. The appendix is not visualized. No appendicitis. No small bowel obstruction or inflammation. Decompressed stomach. Vascular/Lymphatic: Advanced aortic atherosclerosis. No acute aortic findings. Contrast mixing in the portal splenic confluence. Patent portal vein. No enlarged lymph nodes in the abdomen or pelvis. Reproductive: Status post hysterectomy. No adnexal masses. Other: No free air, free fluid, or intra-abdominal fluid collection. There is presacral soft tissue edema, unchanged. Musculoskeletal: Avascular necrosis of both femoral heads. Diffuse bony under mineralization. No acute osseous findings. IMPRESSION: 1. Heterogeneous enhancement of the left kidney with mild left perinephric edema, suspicious for pyelonephritis. There is also mild bladder wall  thickening and perivesicular fat stranding with Foley catheter in place. Improvement in bilateral hydronephrosis from prior exam. 2. Stool distends the rectum at 7.6 cm with mild distal rectal wall thickening, suspicious for fecal impaction. 3. Colonic diverticulosis without diverticulitis. Aortic Atherosclerosis (ICD10-I70.0). Electronically Signed   By: MKeith RakeM.D.   On: 07/06/2021 19:15    Scheduled Meds:  aspirin EC  81 mg Oral Daily   atorvastatin  20 mg Oral QPM   chlorhexidine gluconate (MEDLINE KIT)  15 mL Mouth Rinse BID   Chlorhexidine Gluconate Cloth  6 each Topical Daily   chlorthalidone  25 mg Oral Daily   DULoxetine  60 mg Oral Daily   enoxaparin (LOVENOX) injection  40 mg Subcutaneous Q24H   guaiFENesin  600 mg Oral BID   insulin aspart  0-5 Units Subcutaneous QHS   insulin aspart  0-9 Units Subcutaneous TID WC   lisinopril  2.5 mg Oral Daily   mouth rinse  15 mL Mouth Rinse 10 times per day   metoCLOPramide (REGLAN) injection  10 mg Intravenous Q6H   mirtazapine  15 mg Oral QHS   pantoprazole (PROTONIX) IV  40 mg Intravenous Q12H   rOPINIRole  1 mg Oral QHS    Continuous Infusions:  sodium chloride 75 mL/hr (07/06/21 2248)   cefTRIAXone (ROCEPHIN)  IV     levETIRAcetam 500 mg (07/07/21 0546)     LOS: 1 day     CKayleen Memos MD [facility] Pager [phone] If 7PM-7AM, please contact night-coverage [email or web address] Password  [email or web address] 07/07/2021, 1:23 PM

## 2021-07-07 NOTE — Progress Notes (Signed)
Patients CBG 68 , able to wake patient and offer her one container of orange juice. Patient able to make needs known, states her leg had been bothering her

## 2021-07-07 NOTE — Evaluation (Signed)
Clinical/Bedside Swallow Evaluation Patient Details  Name: [name] MRN: [id number] Date of Birth: May 23, 1936  Today's Date: 07/07/2021 Time: SLP Start Time (ACUTE ONLY): 1504 SLP Stop Time (ACUTE ONLY): 1528 SLP Time Calculation (min) (ACUTE ONLY): 24 min  Past Medical History:  Past Medical History:  Diagnosis Date   DM type 2 with diabetic peripheral neuropathy ([place])    High cholesterol    Neck pain    Neuropathy    Past Surgical History:  Past Surgical History:  Procedure Laterality Date   ABDOMINAL HYSTERECTOMY     ANTERIOR CERVICAL DECOMP/DISCECTOMY FUSION N/A 02/05/2021   Procedure: Cervical Three-Four  Anterior cervical decompression/discectomy/fusion;  Surgeon: Ashok Pall, MD;  Location: [place];  Service: Neurosurgery;  Laterality: N/A;  RM 20   APPENDECTOMY     BACK SURGERY     CERVICAL DISC SURGERY     HAND SURGERY     KNEE SURGERY     HPI:  [name] is a 85 y.o. female with a history of upper cervical spinal injury with quadriplegia, type II diabetes with peripheral neuropathy, hypertension, hyperlipidemia, GERD who presented from [facility] to [facility] ED, due to confusion and unresponsiveness.  Patient was at her baseline earlier on 07/06/2021 and was eating.  After eating a few bites of oatmeal, the patient was noted to be staring off at a clock and unresponsive.  She then had a couple of brief shaking episodes of her upper extremities and then went completely unresponsive.  No prior history of seizures.  Upon presentation to the ED, head CT negative, neurology recommended treatment with Keppra and an MRI brain. Pt's daughter reports discomfort in her throat that has been present for weeks. BSE requested.    Assessment / Plan / Recommendation  Clinical Impression  Clinical swallow evaluation completed at bedside. Oral motor examination is WNL. Pt wears U/L dentures that appear to fit well. She says sometimes she has difficulty swallowing a large amount of pills  due to some collecting under her bottom denture. She has been taking her pills in applesauce at the [facility]. She denies difficulty or discomfort with swallow, but does report nausea. Pt consumed thin liquids via straw sips and exhibited on episode of immediate coughing, however she stated that for the past several months, she coughs when she drinks cold liquids. She was then provided with room temperature water via straw and no further coughing exhibited. Pt consumed puree and regular textures without incident, although a limited amount. Recommend regular textures (advance per MD due to Pt on clear liquids due to nausea) and thin liquids, avoid cold liquids, and provide medications whole in puree with standard aspiration and reflux precautions. No futher SLP services indicated at this time. SLP Visit Diagnosis: Dysphagia, unspecified (R13.10)    Aspiration Risk  No limitations    Diet Recommendation Regular;Thin liquid   Liquid Administration via: Cup;Straw Medication Administration: Whole meds with puree Supervision: Staff to assist with self feeding Postural Changes: Seated upright at 90 degrees;Remain upright for at least 30 minutes after po intake    Other  Recommendations Oral Care Recommendations: Oral care BID Other Recommendations: Clarify dietary restrictions    Recommendations for follow up therapy are one component of a multi-disciplinary discharge planning process, led by the attending physician.  Recommendations may be updated based on patient status, additional functional criteria and insurance authorization.  Follow up Recommendations None      Frequency and Duration  Prognosis Prognosis for Safe Diet Advancement: Good Barriers to Reach Goals:  (nausea)      Swallow Study   General Date of Onset: 07/06/21 HPI: [name] is a 85 y.o. female with a history of upper cervical spinal injury with quadriplegia, type II diabetes with peripheral neuropathy,  hypertension, hyperlipidemia, GERD who presented from [facility] to [facility] ED, due to confusion and unresponsiveness.  Patient was at her baseline earlier on 07/06/2021 and was eating.  After eating a few bites of oatmeal, the patient was noted to be staring off at a clock and unresponsive.  She then had a couple of brief shaking episodes of her upper extremities and then went completely unresponsive.  No prior history of seizures.  Upon presentation to the ED, head CT negative, neurology recommended treatment with Keppra and an MRI brain. Pt's daughter reports discomfort in her throat that has been present for weeks. BSE requested. Type of Study: Bedside Swallow Evaluation Previous Swallow Assessment: N/A Diet Prior to this Study:  (clear liquids) Temperature Spikes Noted: No Respiratory Status: Room air History of Recent Intubation: No Behavior/Cognition: Alert;Cooperative;Pleasant mood Oral Cavity Assessment: Within Functional Limits Oral Care Completed by SLP: Recent completion by staff Oral Cavity - Dentition: Dentures, top;Dentures, bottom Vision: Functional for self-feeding Self-Feeding Abilities: Able to feed self with adaptive devices;Needs assist Patient Positioning: Upright in bed Baseline Vocal Quality: Normal;Low vocal intensity Volitional Cough: Strong Volitional Swallow: Able to elicit    Oral/Motor/Sensory Function Overall Oral Motor/Sensory Function: Within functional limits   Ice Chips Ice chips: Within functional limits Presentation: Spoon Other Comments: Pt does not like cold items   Thin Liquid Thin Liquid: Within functional limits Presentation: Cup;Straw;Self Fed Other Comments: Pt with one strong coughing episode when she took a sip of ice water, she says that cold drinks stimulate couging. Pt given room temperature water and no further coughing exhibited.    Nectar Thick Nectar Thick Liquid: Not tested   Honey Thick Honey Thick Liquid: Not tested   Puree Puree: Within  functional limits Presentation: Spoon   Solid     Solid: Within functional limits Presentation: Self Fed     Thank you,  Genene Churn, [place] 07/07/2021,4:05 PM

## 2021-07-08 DIAGNOSIS — G9340 Encephalopathy, unspecified: Secondary | ICD-10-CM | POA: Diagnosis not present

## 2021-07-08 LAB — CBC
HCT: 26.4 % — ABNORMAL LOW (ref 36.0–46.0)
Hemoglobin: 8.8 g/dL — ABNORMAL LOW (ref 12.0–15.0)
MCH: 30.1 pg (ref 26.0–34.0)
MCHC: 33.3 g/dL (ref 30.0–36.0)
MCV: 90.4 fL (ref 80.0–100.0)
Platelets: 350 10*3/uL (ref 150–400)
RBC: 2.92 MIL/uL — ABNORMAL LOW (ref 3.87–5.11)
RDW: 14.1 % (ref 11.5–15.5)
WBC: 5.6 10*3/uL (ref 4.0–10.5)
nRBC: 0 % (ref 0.0–0.2)

## 2021-07-08 LAB — BASIC METABOLIC PANEL
Anion gap: 8 (ref 5–15)
BUN: 21 mg/dL (ref 8–23)
CO2: 18 mmol/L — ABNORMAL LOW (ref 22–32)
Calcium: 8.5 mg/dL — ABNORMAL LOW (ref 8.9–10.3)
Chloride: 105 mmol/L (ref 98–111)
Creatinine, Ser: 0.93 mg/dL (ref 0.44–1.00)
GFR, Estimated: >60 mL/min (ref >60)
Glucose, Bld: 184 mg/dL — ABNORMAL HIGH (ref 70–99)
Potassium: 5.4 mmol/L — ABNORMAL HIGH (ref 3.5–5.1)
Sodium: 131 mmol/L — ABNORMAL LOW (ref 135–145)

## 2021-07-08 LAB — GLUCOSE, CAPILLARY
Glucose-Capillary: 84 mg/dL (ref 70–99)
Glucose-Capillary: 169 mg/dL — ABNORMAL HIGH (ref 70–99)
Glucose-Capillary: 114 mg/dL — ABNORMAL HIGH (ref 70–99)
Glucose-Capillary: 148 mg/dL — ABNORMAL HIGH (ref 70–99)

## 2021-07-08 MED ORDER — GABAPENTIN 100 MG PO CAPS
100.0000 mg | ORAL_CAPSULE | Freq: Two times a day (BID) | ORAL | Status: DC
  Administered 2021-07-08 – 2021-07-09 (×2): 100 mg via ORAL
  Filled 2021-07-08 (×2): qty 1

## 2021-07-08 MED ORDER — SODIUM CHLORIDE 0.9 % IV SOLN
75.0000 mL/h | INTRAVENOUS | Status: DC
  Administered 2021-07-08 – 2021-07-09 (×2): 75 mL/h via INTRAVENOUS

## 2021-07-08 NOTE — Progress Notes (Signed)
PROGRESS NOTE  [name]:[id number] DOB: 02/05/1936 DOA: 07/06/2021 PCP: Curlene Labrum, MD  HPI/Recap of past 24 hours: [name] is a 85 y.o. female with a history of upper cervical spinal injury with quadriplegia, type II diabetes with peripheral neuropathy, hypertension, hyperlipidemia, GERD who presented from [facility] to [facility] ED, due to confusion and unresponsiveness.  Patient was at her baseline earlier on 07/06/2021 and was eating.  After eating a few bites of oatmeal, the patient was noted to be staring off at a clock and unresponsive.  She then had a couple of brief shaking episodes of her upper extremities and then went completely unresponsive.  No prior history of seizures.  Upon presentation to the ED, head CT negative, neurology recommended treatment with Keppra and an MRI brain.  No evidence of acute intracranial abnormalities on MRI brain.  07/08/2021: Seen and examined at bedside.  Daughter present at bedside.  Reports numbness in her hands and lower extremities bilaterally.   Assessment/Plan: Active Problems:   Essential (primary) hypertension   Type 2 diabetes mellitus with diabetic neuropathy, unspecified (HCC)   GERD without esophagitis   CKD (chronic kidney disease), stage III (HCC)   Urinary retention   Central cord syndrome at C4 level of cervical spinal cord (HCC)   CKD stage 3 due to type 2 diabetes mellitus ([place])   Acute encephalopathy   Pressure injury of skin  Acute metabolic encephalopathy, likely contributed by UTI, rule out seizures versus underlying neurological structural abnormality. Weakness upper extremity shaking episodes then unresponsiveness Keppra was recommended by cardiology which was started on admission. CT head was unrevealing. MRI brain ordered and pending. Mentation appears to be improved today Reorient as needed  Proteus mirabilis, Klebsiella pneumoniae UTI, POA UA positive for pyuria, urine culture grew greater than  100,000 colonies of Proteus mirabilis, 30,000 colonies of Klebsiella pneumonia. Awaiting sensitivities. Continue Rocephin started on admission  Chronic hyponatremia with hypovolemia Serum sodium 126 Continue normal saline at 75 cc/h Obtain TSH in the morning  Numbness affecting bilateral hands and lower extremities bilaterally, with concern for polyneuropathy Gabapentin 100 mg twice daily started on 07/08/2021.  Resolved hyperkalemia Potassium improved 5.0 from 5.3 Continue to monitor  Restless leg syndrome Continue home ropinirole  Type 2 diabetes with hyperglycemia Hemoglobin A1c 8.3 on 07/06/2021 Continue insulin sliding scale.  Throat discomfort, unclear if she has dysphagia Speech therapist to assess  Chronic anxiety/depression Continue home duloxetine  Ambulatory dysfunction PT OT to assess Fall precautions    Code Status: DNR  Family Communication: Daughter at bedside  Disposition Plan: Likely will return to SNF after brain MRI, possibly on 07/08/2021.   Consultants: Neurology contacted by EDP.  Procedures: None  Antimicrobials: Rocephin  DVT prophylaxis: Subcu Lovenox daily.  Status is: Inpatient  Inpatient status.  Patient will require at least 2 midnights for further evaluation and treatment of present condition.      Objective: Vitals:   07/07/21 1431 07/07/21 2031 07/08/21 0418 07/08/21 1300  BP: 115/63 140/62 (!) 136/58 (!) 144/50  Pulse: 67 69 72 78  Resp: _0 Temp: 98 F (36.7 C) 98.2 F (36.8 C) 98.2 F (36.8 C) 98 F (36.7 C)  TempSrc: Oral Oral  Oral  SpO2: 98% 99% 99% 98%    Intake/Output Summary (Last 24 hours) at 07/08/2021 1758 Last data filed at 07/08/2021 1100 Gross per 24 hour  Intake 580 ml  Output 2450 ml  Net -1870 ml  There were no vitals filed for this visit.  Exam:  General: 85 y.o. year-old female frail-appearing in no acute distress.  She is alert and interactive.   Cardiovascular: Regular  rate and rhythm with no rubs or gallops.   Respiratory: Clear to auscultation no wheezes or rales.  Abdomen: Soft nontender no bowel sounds present.  Musculoskeletal: Trace lower extremity edema bilaterally.   Skin: No ulcerative lesions noted. Psychiatry: Mood is appropriate for condition and setting.  Neurology: Bilateral lower extremity weakness.   Data Reviewed: CBC: Recent Labs  Lab 07/06/21 1602 07/07/21 1033  WBC 7.6 5.2  NEUTROABS 4.4  --   HGB 9.7* 8.5*  HCT 29.8* 26.1*  MCV 89.2 91.3  PLT 409* 341   Basic Metabolic Panel: Recent Labs  Lab 07/06/21 1602 07/07/21 1033  NA 126* 126*  K 5.3* 5.0  CL 92* 100  CO2 23 19*  GLUCOSE 92 156*  BUN 44* 36*  CREATININE 1.09* 1.01*  CALCIUM 9.4 8.4*   GFR: Estimated Creatinine Clearance: 39.3 mL/min (A) (by C-G formula based on SCr of 1.01 mg/dL (H)). Liver Function Tests: Recent Labs  Lab 07/06/21 1602  AST 16  ALT 24  ALKPHOS 98  BILITOT 0.3  PROT 7.7  ALBUMIN 3.4*   No results for input(s): LIPASE, AMYLASE in the last 168 hours. No results for input(s): AMMONIA in the last 168 hours. Coagulation Profile: Recent Labs  Lab 07/06/21 1602  INR 1.1   Cardiac Enzymes: No results for input(s): CKTOTAL, CKMB, CKMBINDEX, TROPONINI in the last 168 hours. BNP (last 3 results) No results for input(s): PROBNP in the last 8760 hours. HbA1C: Recent Labs    07/06/21 1602  HGBA1C 8.3*   CBG: Recent Labs  Lab 07/07/21 1629 07/07/21 2033 07/08/21 0731 07/08/21 1130 07/08/21 1619  GLUCAP 125* 75 84 169* 114*   Lipid Profile: No results for input(s): CHOL, HDL, LDLCALC, TRIG, CHOLHDL, LDLDIRECT in the last 72 hours. Thyroid Function Tests: No results for input(s): TSH, T4TOTAL, FREET4, T3FREE, THYROIDAB in the last 72 hours. Anemia Panel: No results for input(s): VITAMINB12, FOLATE, FERRITIN, TIBC, IRON, RETICCTPCT in the last 72 hours. Urine analysis:    Component Value Date/Time   COLORURINE YELLOW  07/06/2021 1615   APPEARANCEUR HAZY (A) 07/06/2021 1615   LABSPEC 1.015 07/06/2021 1615   PHURINE 7.0 07/06/2021 1615   GLUCOSEU NEGATIVE 07/06/2021 1615   [place] 07/06/2021 1615   [place] 07/06/2021 1615   [place] 07/06/2021 1615   PROTEINUR 30 (A) 07/06/2021 1615   NITRITE NEGATIVE 07/06/2021 1615   LEUKOCYTESUR LARGE (A) 07/06/2021 1615   Sepsis Labs: _0 (procalcitonin:4,lacticidven:4)  ) Recent Results (from the past 240 hour(s))  Urine Culture     Status: Abnormal (Preliminary result)   Collection Time: 07/06/21  4:16 PM   Specimen: Urine, Catheterized  Result Value Ref Range Status   Specimen Description   Final    URINE, CATHETERIZED Performed at [facility], [address], [place], [place] [address]    Special Requests   Final    NONE Performed at [facility], [address], [place], [place] [address]    Culture (A)  Final    >=100,000 COLONIES/mL PROTEUS MIRABILIS 30,000 COLONIES/mL KLEBSIELLA PNEUMONIAE SUSCEPTIBILITIES TO FOLLOW Performed at [facility], [place] [address] [place], [place] [address]    Report Status PENDING  Incomplete  Resp Panel by RT-PCR (Flu A&B, Covid) Nasopharyngeal Swab     Status: None   Collection Time: 07/06/21  9:09 PM   Specimen: Nasopharyngeal Swab; Nasopharyngeal(NP) swabs in vial transport medium  Result Value Ref Range Status   SARS Coronavirus 2 by RT PCR NEGATIVE NEGATIVE Final    Comment: (NOTE) SARS-CoV-2 target nucleic acids are NOT DETECTED.  The SARS-CoV-2 RNA is generally detectable in upper respiratory specimens during the acute phase of infection. The lowest concentration of SARS-CoV-2 viral copies this assay can detect is 138 copies/mL. A negative result does not preclude SARS-Cov-2 infection and should not be used as the sole basis for treatment or other patient management decisions. A negative result may occur with  improper specimen collection/handling, submission  of specimen other than nasopharyngeal swab, presence of viral mutation(s) within the areas targeted by this assay, and inadequate number of viral copies(<138 copies/mL). A negative result must be combined with clinical observations, patient history, and epidemiological information. The expected result is Negative.  Fact Sheet for Patients:  EntrepreneurPulse.com.au  Fact Sheet for Healthcare Providers:  IncredibleEmployment.be  This test is no t yet approved or cleared by the [place] FDA and  has been authorized for detection and/or diagnosis of SARS-CoV-2 by FDA under an Emergency Use Authorization (EUA). This EUA will remain  in effect (meaning this test can be used) for the duration of the COVID-19 declaration under Section 564(b)(1) of the Act, 21 U.S.C.section 360bbb-3(b)(1), unless the authorization is terminated  or revoked sooner.       Influenza A by PCR NEGATIVE NEGATIVE Final   Influenza B by PCR NEGATIVE NEGATIVE Final    Comment: (NOTE) The Xpert Xpress SARS-CoV-2/FLU/RSV plus assay is intended as an aid in the diagnosis of influenza from Nasopharyngeal swab specimens and should not be used as a sole basis for treatment. Nasal washings and aspirates are unacceptable for Xpert Xpress SARS-CoV-2/FLU/RSV testing.  Fact Sheet for Patients: EntrepreneurPulse.com.au  Fact Sheet for Healthcare Providers: IncredibleEmployment.be  This test is not yet approved or cleared by the [place] FDA and has been authorized for detection and/or diagnosis of SARS-CoV-2 by FDA under an Emergency Use Authorization (EUA). This EUA will remain in effect (meaning this test can be used) for the duration of the COVID-19 declaration under Section 564(b)(1) of the Act, 21 U.S.C. section 360bbb-3(b)(1), unless the authorization is terminated or revoked.  Performed at [facility], [address]  [place], [place] [address]       Studies: No results found.  Scheduled Meds:  aspirin EC  81 mg Oral Daily   atorvastatin  20 mg Oral QPM   chlorhexidine gluconate (MEDLINE KIT)  15 mL Mouth Rinse BID   Chlorhexidine Gluconate Cloth  6 each Topical Daily   chlorthalidone  25 mg Oral Daily   DULoxetine  60 mg Oral Daily   enoxaparin (LOVENOX) injection  40 mg Subcutaneous Q24H   guaiFENesin  600 mg Oral BID   insulin aspart  0-5 Units Subcutaneous QHS   insulin aspart  0-9 Units Subcutaneous TID WC   lisinopril  2.5 mg Oral Daily   mouth rinse  15 mL Mouth Rinse 10 times per day   metoCLOPramide (REGLAN) injection  10 mg Intravenous Q6H   mirtazapine  15 mg Oral QHS   pantoprazole (PROTONIX) IV  40 mg Intravenous Q12H   rOPINIRole  1 mg Oral QHS   senna-docusate  2 tablet Oral BID    Continuous Infusions:  sodium chloride 75 mL/hr (07/08/21 0419)   cefTRIAXone (ROCEPHIN)  IV 1 g (07/08/21 1633)   levETIRAcetam 500 mg (07/08/21 1745)  LOS: 2 days     Kayleen Memos, MD [facility] Pager [phone]  If 7PM-7AM, please contact night-coverage [email or web address] Password [facility] 07/08/2021, 5:58 PM

## 2021-07-08 NOTE — Plan of Care (Signed)
  Problem: Health Behavior/Discharge Planning: Goal: Ability to manage health-related needs will improve Outcome: Progressing   Problem: Clinical Measurements: Goal: Ability to maintain clinical measurements within normal limits will improve Outcome: Progressing Goal: Will remain free from infection Outcome: Progressing Goal: Diagnostic test results will improve Outcome: Progressing Goal: Respiratory complications will improve Outcome: Progressing Goal: Cardiovascular complication will be avoided Outcome: Progressing   Problem: Coping: Goal: Level of anxiety will decrease Outcome: Progressing   Problem: Elimination: Goal: Will not experience complications related to bowel motility Outcome: Progressing Goal: Will not experience complications related to urinary retention Outcome: Progressing   Problem: Pain Managment: Goal: General experience of comfort will improve Outcome: Progressing   Problem: Safety: Goal: Ability to remain free from injury will improve Outcome: Progressing   Problem: Skin Integrity: Goal: Risk for impaired skin integrity will decrease Outcome: Progressing   

## 2021-07-09 ENCOUNTER — Inpatient Hospital Stay
Admission: RE | Admit: 2021-07-09 | Discharge: 2021-09-14 | Disposition: A | Discharge status: Still In Hospital | Payer: Medicare HMO | Source: Ambulatory Visit | Attending: Internal Medicine | Admitting: Internal Medicine

## 2021-07-09 DIAGNOSIS — S14124S Central cord syndrome at C4 level of cervical spinal cord, sequela: Secondary | ICD-10-CM | POA: Diagnosis not present

## 2021-07-09 DIAGNOSIS — N183 Chronic kidney disease, stage 3 unspecified: Secondary | ICD-10-CM

## 2021-07-09 DIAGNOSIS — R569 Unspecified convulsions: Secondary | ICD-10-CM

## 2021-07-09 DIAGNOSIS — E1122 Type 2 diabetes mellitus with diabetic chronic kidney disease: Secondary | ICD-10-CM | POA: Diagnosis not present

## 2021-07-09 DIAGNOSIS — G9340 Encephalopathy, unspecified: Secondary | ICD-10-CM | POA: Diagnosis not present

## 2021-07-09 DIAGNOSIS — N3000 Acute cystitis without hematuria: Secondary | ICD-10-CM

## 2021-07-09 DIAGNOSIS — E871 Hypo-osmolality and hyponatremia: Secondary | ICD-10-CM

## 2021-07-09 DIAGNOSIS — I10 Essential (primary) hypertension: Secondary | ICD-10-CM

## 2021-07-09 LAB — BASIC METABOLIC PANEL
Anion gap: 9 (ref 5–15)
BUN: 17 mg/dL (ref 8–23)
CO2: 20 mmol/L — ABNORMAL LOW (ref 22–32)
Calcium: 8.7 mg/dL — ABNORMAL LOW (ref 8.9–10.3)
Chloride: 107 mmol/L (ref 98–111)
Creatinine, Ser: 0.87 mg/dL (ref 0.44–1.00)
GFR, Estimated: >60 mL/min (ref >60)
Glucose, Bld: 99 mg/dL (ref 70–99)
Potassium: 4.9 mmol/L (ref 3.5–5.1)
Sodium: 136 mmol/L (ref 135–145)

## 2021-07-09 LAB — CBC
HCT: 26.8 % — ABNORMAL LOW (ref 36.0–46.0)
Hemoglobin: 8.7 g/dL — ABNORMAL LOW (ref 12.0–15.0)
MCH: 29.7 pg (ref 26.0–34.0)
MCHC: 32.5 g/dL (ref 30.0–36.0)
MCV: 91.5 fL (ref 80.0–100.0)
Platelets: 341 10*3/uL (ref 150–400)
RBC: 2.93 MIL/uL — ABNORMAL LOW (ref 3.87–5.11)
RDW: 14.2 % (ref 11.5–15.5)
WBC: 5.6 10*3/uL (ref 4.0–10.5)
nRBC: 0.4 % — ABNORMAL HIGH (ref 0.0–0.2)

## 2021-07-09 LAB — GLUCOSE, CAPILLARY
Glucose-Capillary: 101 mg/dL — ABNORMAL HIGH (ref 70–99)
Glucose-Capillary: 193 mg/dL — ABNORMAL HIGH (ref 70–99)

## 2021-07-09 MED ORDER — GABAPENTIN 100 MG PO CAPS: 100.0000 mg | ORAL_CAPSULE | Freq: Two times a day (BID) | ORAL | Status: DC

## 2021-07-09 MED ORDER — LEVETIRACETAM 500 MG PO TABS: 500.0000 mg | ORAL_TABLET | Freq: Two times a day (BID) | ORAL | Status: DC

## 2021-07-09 MED ORDER — LEVETIRACETAM 500 MG PO TABS
500.0000 mg | ORAL_TABLET | Freq: Two times a day (BID) | ORAL | Status: DC
  Administered 2021-07-09: 500 mg via ORAL
  Filled 2021-07-09: qty 1

## 2021-07-09 NOTE — TOC Transition Note (Signed)
Transition of Care [facility]) - CM/SW Discharge Note   Patient Details  Name: [name] MRN: [id number] Date of Birth: 18-Aug-1936  [facility] CM/SW Contact:  Ihor Gully, LCSW Phone Number: 07/09/2021, 3:03 PM   Clinical Narrative:    Discharge clinicals sent to facility. RN to call report. Message left for daughter regarding d/c. TOC signing off.    Final next level of care: [place] Barriers to Discharge: No Barriers Identified   Patient Goals and CMS Choice Patient states their goals for this hospitalization and ongoing recovery are:: (P) return to facility      Discharge Placement                  Name of family member notified: mesage left for [name]    Discharge Plan and Services                                     Social Determinants of Health (SDOH) Interventions     Readmission Risk Interventions Readmission Risk Prevention Plan 12/04/2020  Medication Screening Complete  Transportation Screening Complete  Some recent data might be hidden

## 2021-07-09 NOTE — NC FL2 (Signed)
[place] MEDICAID FL2 LEVEL OF CARE SCREENING TOOL     IDENTIFICATION  Patient Name: [name] Birthdate: [birth year] Sex: female Admission Date (Current Location): 07/06/2021  [facility] and [place] Number:  [employer] and Address:  [place] [address] [place]      Provider Number: [id number]  Attending Physician Name and Address:  Barton Dubois, MD  Relative Name and Phone Number:  Charlcie Cradle (Daughter)   [phone]    Current Level of Care: SNF Recommended Level of Care: [place] Prior Approval Number:    Date Approved/Denied:   PASRR Number:    Discharge Plan: SNF    Current Diagnoses: Patient Active Problem List   Diagnosis Date Noted   Seizures ([place])    Acute cystitis without hematuria    Pressure injury of skin 07/07/2021   Acute encephalopathy 07/06/2021   Nausea in adult 06/24/2021   Hypertension associated with stage 3a chronic kidney disease due to type 2 diabetes mellitus ([place]) 06/24/2021   CKD stage 3 due to type 2 diabetes mellitus ([place]) 06/19/2021   Gastritis, acute 06/18/2021   Adult failure to thrive syndrome 06/17/2021   Left shoulder pain 06/12/2021   Major depression, recurrent, chronic ([place]) 06/02/2021   Essential (primary) hypertension 05/27/2021   RLS (restless legs syndrome) 04/21/2021   Elevated blood-pressure reading, without diagnosis of hypertension 03/25/2021   Type 2 diabetes mellitus with stage 3 chronic kidney disease, with long-term current use of insulin ([place]) 03/12/2021   Shoulder impingement syndrome 02/18/2021   Quadriplegia, C1-C4 incomplete ([place]) 02/17/2021   Chronic anxiety 02/17/2021   Protein-calorie malnutrition, severe ([place]) 02/17/2021   Aortic atherosclerosis ([place]) 02/17/2021   DNR (do not resuscitate) 02/12/2021   Central cord syndrome at C4 level of cervical spinal cord ([place]) 02/05/2021   Urinary retention 01/31/2021   Cord compression ([place])  01/29/2021   Acute lower UTI 01/29/2021   Hyponatremia 01/29/2021   Closed fracture of proximal end of left fibula with routine healing 12/19/2020   CKD (chronic kidney disease), stage III ([place]) 12/11/2020   Hyperlipidemia associated with type 2 diabetes mellitus ([place]) 12/05/2020   Type 2 diabetes mellitus with diabetic neuropathy, unspecified ([place]) 12/05/2020   GERD without esophagitis 12/05/2020   Chronic constipation 12/05/2020   Diabetic peripheral neuropathy ([place]) 12/05/2020   Avascular necrosis of bones of both hips ([place]) 12/05/2020   Acute kidney injury superimposed on CKD (HCC)    Normocytic anemia    Bilateral sensorineural hearing loss 05/02/2020   Spinal stenosis in cervical region 01/27/2019   Degeneration of lumbar intervertebral disc 12/12/2018    Orientation RESPIRATION BLADDER Height & Weight     Self, Time, Situation, Place  Normal Incontinent Weight:   Height:     BEHAVIORAL SYMPTOMS/MOOD NEUROLOGICAL BOWEL NUTRITION STATUS      Incontinent Diet (heart healthy/carb modified)  AMBULATORY STATUS COMMUNICATION OF NEEDS Skin   Total Care Verbally PU Stage and Appropriate Care ([place] mid stage 2)                       Personal Care Assistance Level of Assistance  Bathing, Dressing, Feeding Bathing Assistance: Maximum assistance Feeding assistance: Independent Dressing Assistance: Maximum assistance     Functional Limitations Info  Sight, Hearing, Speech Sight Info: Adequate Hearing Info: Adequate Speech Info: Adequate    SPECIAL CARE FACTORS FREQUENCY  Contractures Contractures Info: Not present    Additional Factors Info  Code Status, Allergies, Psychotropic Code Status Info: DNR Allergies Info: Codeine, Sulfa Antibiotics Psychotropic Info: Cymbalta, Remeron         Current Medications (07/09/2021):  This is the current hospital active medication list Current Facility-Administered Medications  Medication Dose  Route Frequency Provider Last Rate Last Admin   0.9 %  sodium chloride infusion  75 mL/hr Intravenous Continuous Kayleen Memos, DO 75 mL/hr at 07/09/21 0516 75 mL/hr at 07/09/21 0516   albuterol (PROVENTIL) (2.5 MG/3ML) 0.083% nebulizer solution 3 mL  3 mL Inhalation Q4H PRN Truett Mainland, DO       aspirin EC tablet 81 mg  81 mg Oral Daily Truett Mainland, DO   81 mg at 07/09/21 0957   atorvastatin (LIPITOR) tablet 20 mg  20 mg Oral QPM Truett Mainland, DO   20 mg at 07/08/21 1738   bisacodyl (DULCOLAX) suppository 10 mg  10 mg Rectal Daily PRN Irene Pap N, DO       cefTRIAXone (ROCEPHIN) 1 g in sodium chloride 0.9 % 100 mL IVPB  1 g Intravenous Q24H Truett Mainland, DO 200 mL/hr at 07/08/21 1633 1 g at 07/08/21 1633   chlorhexidine gluconate (MEDLINE KIT) (PERIDEX) 0.12 % solution 15 mL  15 mL Mouth Rinse BID Truett Mainland, DO   15 mL at 07/08/21 2039   Chlorhexidine Gluconate Cloth 2 % PADS 6 each  6 each Topical Daily Truett Mainland, DO   6 each at 07/09/21 1202   chlorthalidone (HYGROTON) tablet 25 mg  25 mg Oral Daily Truett Mainland, DO   25 mg at 07/09/21 0958   DULoxetine (CYMBALTA) DR capsule 60 mg  60 mg Oral Daily Truett Mainland, DO   60 mg at 07/09/21 1013   enoxaparin (LOVENOX) injection 40 mg  40 mg Subcutaneous Q24H Truett Mainland, DO   40 mg at 07/08/21 2039   gabapentin (NEURONTIN) capsule 100 mg  100 mg Oral BID Irene Pap N, DO   100 mg at 07/09/21 1202   guaiFENesin (MUCINEX) 12 hr tablet 600 mg  600 mg Oral BID Truett Mainland, DO   600 mg at 07/09/21 5009   insulin aspart (novoLOG) injection 0-5 Units  0-5 Units Subcutaneous QHS Stinson, Jacob J, DO       insulin aspart (novoLOG) injection 0-9 Units  0-9 Units Subcutaneous TID WC Truett Mainland, DO   2 Units at 07/09/21 1202   levETIRAcetam (KEPPRA) tablet 500 mg  500 mg Oral BID Barton Dubois, MD   500 mg at 07/09/21 0958   lisinopril (ZESTRIL) tablet 2.5 mg  2.5 mg Oral Daily Truett Mainland, DO    2.5 mg at 07/09/21 3818   LORazepam (ATIVAN) injection 4 mg  4 mg Intravenous Q5 Min x 2 PRN Truett Mainland, DO       MEDLINE mouth rinse  15 mL Mouth Rinse 10 times per day Truett Mainland, DO   15 mL at 07/09/21 1203   metoCLOPramide (REGLAN) injection 10 mg  10 mg Intravenous Q6H Truett Mainland, DO   10 mg at 07/09/21 1203   mirtazapine (REMERON) tablet 15 mg  15 mg Oral QHS Truett Mainland, DO   15 mg at 07/08/21 2119   ondansetron (ZOFRAN) tablet 4 mg  4 mg Oral Q6H PRN Truett Mainland, DO       Or  ondansetron (ZOFRAN) injection 4 mg  4 mg Intravenous Q6H PRN Truett Mainland, DO       pantoprazole (PROTONIX) injection 40 mg  40 mg Intravenous Q12H Truett Mainland, DO   40 mg at 07/09/21 1001   prochlorperazine (COMPAZINE) tablet 10 mg  10 mg Oral Q6H PRN Truett Mainland, DO       rOPINIRole (REQUIP) tablet 1 mg  1 mg Oral QHS Stinson, Jacob J, DO   1 mg at 07/08/21 2118   senna-docusate (Senokot-S) tablet 2 tablet  2 tablet Oral BID Irene Pap N, DO   2 tablet at 07/09/21 0957   tiZANidine (ZANAFLEX) tablet 2 mg  2 mg Oral Q6H PRN Truett Mainland, DO   2 mg at 07/08/21 1008     Discharge Medications: Please see discharge summary for a list of discharge medications.  Relevant Imaging Results:  Relevant Lab Results:   Additional Information SSN: [id number].  Stefani Baik, Clydene Pugh, LCSW

## 2021-07-09 NOTE — Progress Notes (Signed)
PT Cancellation Note  Patient Details Name: [name] MRN: [id number] DOB: 1935-12-26   Cancelled Treatment:    Reason Eval/Treat Not Completed: PT screened, no needs identified, will sign off. Patient baseline nonambulatory, uses hoyer lift for transfers.   9:52 AM, 07/09/21 Lonell Grandchild, MPT Physical Therapist with [facility] [phone] office [phone] mobile phone

## 2021-07-09 NOTE — Discharge Summary (Signed)
Physician Discharge Summary  [name] HCW:[id number] DOB: 21-Jul-1936 DOA: 07/06/2021  PCP: Curlene Labrum, MD  Admit date: 07/06/2021 Discharge date: 07/09/2021  Time spent: 35 minutes  Recommendations for Outpatient Follow-up:  Outpatient follow-up with neurology service in 4 weeks Repeat basic metabolic panel in 1 week to follow electrolytes and renal function and stability. Continue close monitoring of patient's CBGs with further adjustment to hypoglycemia regimen as required.  Discharge Diagnoses:  Active Problems:   Essential (primary) hypertension   Type 2 diabetes mellitus with diabetic neuropathy, unspecified (HCC)   GERD without esophagitis   CKD (chronic kidney disease), stage III (HCC)   Urinary retention   Central cord syndrome at C4 level of cervical spinal cord (HCC)   CKD stage 3 due to type 2 diabetes mellitus ([place])   Acute encephalopathy   Pressure injury of skin   Seizures ([place])   Acute cystitis without hematuria   Discharge Condition: Stable and improved.  Discharged back to skilled nursing facility for further care and rehabilitation.  Patient to follow-up with neurology service in 4 weeks.  CODE STATUS: DNR/DNI.  Diet recommendation: Heart healthy/modified carbohydrate diet.  History of present illness:  As per H&P written by Dr. Nehemiah Settle on 07/06/2021 [name] is a 85 y.o. female with a history of upper cervical spinal injury with quadriplegia, diabetes with peripheral neuropathy, hypertension, hyperlipidemia, GERD.  Patient is at the [facility].  Due to confusion and unresponsiveness, history is obtained from the daughter.  Patient was at her baseline earlier today and was eating.  After eating a few bites of oatmeal, the patient was noted to be staring off at a clock and unresponsive.  She then had a couple of brief shaking episodes of her upper extremities and then went completely unresponsive.  Since that time, she has been fairly  unresponsive and lethargic.  She has been able to mumble a few words including that her abdomen hurts and that she knew where she was.  No palliating or provoking factors.  Patient's daughter does report that she has a history of seizures in the past, but is not on antiepileptic.   Emergency Department Course: Head CT negative.  Patient discussed with neurologist who recommended treatment with Keppra and an MRI tomorrow.  126.  Creatinine 1.09.  White count normal.  Patient does have an indwelling catheter and has WBCs although does not have any bacteria.  Patient was given a gram of Rocephin and urine cultured.  Hospital Course:  Acute metabolic encephalopathy, likely contributed by UTI, rule out seizures versus underlying neurological structural abnormality. -Weakness upper extremity shaking episodes then unresponsiveness -Keppra was recommended by neurology service, which was started on admission. -CT head and MRI was unrevealing. -Patient will continue low-dose Keppra twice a day at time of discharge and will follow up with neurology service in 4 weeks. -Mentation back to baseline prior to going back to her skilled nursing facility for further care and rehabilitation.   Proteus mirabilis, Klebsiella pneumoniae UTI, POA -UA positive for pyuria, urine culture grew greater than -100,000 colonies of Proteus mirabilis, 30,000 colonies of Klebsiella pneumonia. -Appropriately treated with IV ceftriaxone during hospitalization -Patient advised to maintain adequate hydration. -No dysuria, no fever, normal WBCs at discharge.   Chronic hyponatremia with hypovolemia -Serum sodium 126 at time of admission -Thyroid panel within normal limit -After fluid resuscitation provided patient electrolytes improved and within normal limits at time of discharge; sodium 136.   Numbness affecting bilateral hands and lower  extremities bilaterally, with concern for polyneuropathy -Gabapentin 100 mg twice daily  started on 07/08/2021. -Continue outpatient follow-up with neurosurgery as previously arranged.   Resolved hyperkalemia -Potassium improved and within normal limits at time of discharge. -Repeat basic metabolic panel in 1 week to assess electrolytes stability. -Maintain adequate hydration.   Restless leg syndrome -Continue home ropinirole   Type 2 diabetes with hyperglycemia -Hemoglobin A1c 8.3 on 07/06/2021. -Prior to admission hypoglycemic regimen using Lantus and twice a day NovoLog for meal coverage. -Modified carbohydrate diet has been recommended.   Throat discomfort, unclear if she has dysphagia -Most likely associated with acute encephalopathy process. -Seen by speech therapy and with recommendations for regular diet with thin liquids. -No complaints reported at time of discharge.   Chronic anxiety/depression -Continue home duloxetine. -Stable mood; no suicidal ideation or hallucinations.   Ambulatory dysfunction -Continue to follow fall precautions. -Patient to be discharged back to skilled nursing facility for further care and rehabilitation. -Continue constant repositioning and 24/7 assistance/care.  Procedures: See below for x-ray reports.  Consultations: Neurology  Discharge Exam: Vitals:   07/09/21 0405 07/09/21 1002  BP: (!) 156/46 (!) 146/54  Pulse: 68 72  Resp: 19   Temp: 98 F (36.7 C)   SpO2: 100% 99%    General: Afebrile, no chest pain, no nausea, no vomiting oriented x3 and following commands appropriately.  Based on patient's daughter at bedside mentation is back to baseline. Cardiovascular: S1 and S2, no rubs, no gallops, no JVD. Respiratory: Clear to auscultation bilaterally; no using accessory muscles.  Good saturation on room air Abdomen: Soft, nontender, distended, positive bowel sounds Extremities: No cyanosis or clubbing.  Discharge Instructions   Discharge Instructions     Diet - low sodium heart healthy   Complete by: As  directed    Diet Carb Modified   Complete by: As directed    Discharge instructions   Complete by: As directed    Outpatient follow-up with Dr. Merlene Laughter (neurology service) in 3-4 weeks; please set appointment up. Maintain adequate hydration Heart healthy/modified carbohydrates diet (regular consistency with thin liquids).      Allergies as of 07/09/2021       Reactions   Codeine    Sulfa Antibiotics         Medication List     STOP taking these medications    NON FORMULARY       TAKE these medications    acetaminophen 325 MG tablet Commonly known as: TYLENOL Take 650 mg by mouth every 8 (eight) hours.   albuterol 108 (90 Base) MCG/ACT inhaler Commonly known as: VENTOLIN HFA Inhale 1 puff into the lungs every 4 (four) hours as needed for wheezing or shortness of breath.   aspirin EC 81 MG tablet Take 81 mg by mouth daily. Swallow whole.9 am   atorvastatin 20 MG tablet Commonly known as: LIPITOR Take 1 tablet (20 mg total) by mouth every evening.   BIOTENE MOISTURIZING MOUTH MT Use as directed 1 application in the mouth or throat at bedtime. 9 pm   chlorthalidone 25 MG tablet Commonly known as: HYGROTON Take 25 mg by mouth daily.   colchicine 0.6 MG tablet Take 0.6 mg by mouth daily as needed.   DULoxetine 60 MG capsule Commonly known as: CYMBALTA Take 60 mg by mouth daily.   gabapentin 100 MG capsule Commonly known as: NEURONTIN Take 1 capsule (100 mg total) by mouth 2 (two) times daily.   guaiFENesin 600 MG 12 hr tablet Commonly known  as: MUCINEX Take by mouth 2 (two) times daily.   insulin glargine 100 UNIT/ML injection Commonly known as: LANTUS Inject 20 Units into the skin at bedtime.   insulin lispro 100 UNIT/ML KwikPen Commonly known as: HUMALOG Inject 5 Units into the skin 2 (two) times daily with a meal.   levETIRAcetam 500 MG tablet Commonly known as: KEPPRA Take 1 tablet (500 mg total) by mouth 2 (two) times daily.    lisinopril 2.5 MG tablet Commonly known as: ZESTRIL Take 2.5 mg by mouth daily.   melatonin 3 MG Tabs tablet Take 3 mg by mouth at bedtime.   metoCLOPramide 5 MG tablet Commonly known as: REGLAN Take 5 mg by mouth 4 (four) times daily. 1/2 tablet 2.5mg  before meals and at bedtime   mirtazapine 15 MG tablet Commonly known as: REMERON Take 15 mg by mouth at bedtime. What changed: Another medication with the same name was removed. Continue taking this medication, and follow the directions you see here.   omeprazole 40 MG capsule Commonly known as: PRILOSEC Take 40 mg by mouth daily.   prochlorperazine 10 MG tablet Commonly known as: COMPAZINE Take 10 mg by mouth every 6 (six) hours as needed for nausea or vomiting.   rOPINIRole 1 MG tablet Commonly known as: REQUIP Take 1 mg by mouth at bedtime.   tiZANidine 2 MG tablet Commonly known as: ZANAFLEX Take 2 mg by mouth every 6 (six) hours as needed for muscle spasms (for back and sciatic pain).   Venelex Oint Apply topically. Apply to sacrum, coccyx, bilateral buttocks qshift for prevention.       Allergies  Allergen Reactions   Codeine    Sulfa Antibiotics     Contact information for follow-up providers     Phillips Odor, MD. Schedule an appointment as soon as possible for a visit in 4 week(s).   Specialty: Neurology Contact information: [place] [address] [phone]              Contact information for after-discharge care     [facility] .   Service: Skilled Nursing Contact information: [address] [place] [phone]                     The results of significant diagnostics from this hospitalization (including imaging, microbiology, ancillary and laboratory) are listed below for reference.    Significant Diagnostic Studies: CT Head Wo Contrast  Result Date: 07/06/2021 CLINICAL DATA:  Altered  mental status.  Possible seizure activity. EXAM: CT HEAD WITHOUT CONTRAST TECHNIQUE: Contiguous axial images were obtained from the base of the skull through the vertex without intravenous contrast. COMPARISON:  01/25/2021 FINDINGS: Brain: No evidence of acute infarction, hemorrhage, hydrocephalus, extra-axial collection, or mass lesion/mass effect. Vascular:  No hyperdense vessel or other acute findings. Skull: No evidence of fracture or other significant bone abnormality. Sinuses/Orbits: No acute findings. Mucous retention cysts or polyps are again seen in both maxillary sinuses. Other: None. IMPRESSION: No intracranial abnormality or other acute findings. Chronic bilateral maxillary sinus disease. Electronically Signed   By: Marlaine Hind M.D.   On: 07/06/2021 16:04   MR BRAIN W WO CONTRAST  Result Date: 07/07/2021 CLINICAL DATA:  Possible seizure.  Abnormal neurological exam. EXAM: MRI HEAD WITHOUT AND WITH CONTRAST TECHNIQUE: Multiplanar, multiecho pulse sequences of the brain and surrounding structures were obtained without and with intravenous contrast. CONTRAST:  88mL GADAVIST GADOBUTROL 1  MMOL/ML IV SOLN COMPARISON:  Head CT yesterday.  Brain MRI 09/08/2019 FINDINGS: Brain: Diffusion imaging does not show any acute or subacute infarction. Minimal chronic small-vessel ischemic change affects the pons. No focal cerebellar finding. Cerebral hemispheres show old small vessel infarctions of the thalami and mild chronic small-vessel ischemic changes of the hemispheric white matter considering age. No cortical or large vessel territory infarction. No mass lesion, hemorrhage, hydrocephalus or extra-axial collection. Mesial temporal lobes appear symmetric and normal. After contrast administration, no abnormal enhancement occurs. Vascular: Major vessels at the base of the brain show flow. Skull and upper cervical spine: Negative Sinuses/Orbits: Large retention cyst of the left maxillary sinus. No evidence of  inflammatory sinus disease. The orbits appear normal. Other: Large left mastoid effusion. IMPRESSION: No acute intracranial finding. No sign of acute infarction. No lesion seen to explain seizure. The patient does have mild chronic small-vessel ischemic changes considering age affecting the pons, thalami and cerebral hemispheric white matter. Left mastoid effusion. Electronically Signed   By: Nelson Chimes M.D.   On: 07/07/2021 11:36   CT ABDOMEN PELVIS W CONTRAST  Result Date: 07/06/2021 CLINICAL DATA:  Abdominal pain. EXAM: CT ABDOMEN AND PELVIS WITH CONTRAST TECHNIQUE: Multidetector CT imaging of the abdomen and pelvis was performed using the standard protocol following bolus administration of intravenous contrast. CONTRAST:  166mL OMNIPAQUE IOHEXOL 300 MG/ML  SOLN COMPARISON:  CT 2 weeks ago 06/22/2021 FINDINGS: Lower chest: Minimal basilar atelectasis. No confluent consolidation or pleural effusion. Hepatobiliary: Stable peripheral cyst in the liver. No new liver abnormality. There may be slight capsular nodularity. Minimal layering density in the gallbladder. No pericholecystic fat stranding. Normal biliary tree. Pancreas: No ductal dilatation or inflammation. Spleen: Normal in size without focal abnormality. Adrenals/Urinary Tract: Normal adrenal glands. Improvement in bilateral hydronephrosis from prior exam. There is residual prominence of the renal collecting systems and ureters. Heterogeneous enhancement of the left kidney most mild left perinephric edema. No renal fluid collection. No focal renal lesion or stone. Foley catheter within the urinary bladder. There is mild perivesicular fat stranding. Stomach/Bowel: Stool distends the rectum at 7.6 cm. Mild distal rectal wall thickening. Small to moderate volume of colonic stool. Colonic diverticulosis without diverticulitis. The appendix is not visualized. No appendicitis. No small bowel obstruction or inflammation. Decompressed stomach.  Vascular/Lymphatic: Advanced aortic atherosclerosis. No acute aortic findings. Contrast mixing in the portal splenic confluence. Patent portal vein. No enlarged lymph nodes in the abdomen or pelvis. Reproductive: Status post hysterectomy. No adnexal masses. Other: No free air, free fluid, or intra-abdominal fluid collection. There is presacral soft tissue edema, unchanged. Musculoskeletal: Avascular necrosis of both femoral heads. Diffuse bony under mineralization. No acute osseous findings. IMPRESSION: 1. Heterogeneous enhancement of the left kidney with mild left perinephric edema, suspicious for pyelonephritis. There is also mild bladder wall thickening and perivesicular fat stranding with Foley catheter in place. Improvement in bilateral hydronephrosis from prior exam. 2. Stool distends the rectum at 7.6 cm with mild distal rectal wall thickening, suspicious for fecal impaction. 3. Colonic diverticulosis without diverticulitis. Aortic Atherosclerosis (ICD10-I70.0). Electronically Signed   By: Keith Rake M.D.   On: 07/06/2021 19:15   CT ABDOMEN PELVIS W CONTRAST  Result Date: 06/22/2021 CLINICAL DATA:  Generalized abdominal pain. Diverticulitis suspected EXAM: CT ABDOMEN AND PELVIS WITH CONTRAST TECHNIQUE: Multidetector CT imaging of the abdomen and pelvis was performed using the standard protocol following bolus administration of intravenous contrast. CONTRAST:  147mL OMNIPAQUE IOHEXOL 300 MG/ML  SOLN COMPARISON:  01/26/2021 FINDINGS: Lower  chest: No acute abnormality. Hepatobiliary: Gallbladder unremarkable. 1.2 cm cyst peripherally in the liver. No biliary ductal dilatation. Pancreas: No focal abnormality or ductal dilatation. Spleen: No focal abnormality.  Normal size. Adrenals/Urinary Tract: Bilateral hydronephrosis to the level of the bladder. No obstructing stones. Urinary bladder is distended. Foley catheter in place. No renal or adrenal mass. Stomach/Bowel: Left colonic diverticulosis. No  surrounding inflammation to suggest active diverticulitis. Stomach and small bowel decompressed. Vascular/Lymphatic: Aortic atherosclerosis. No evidence of aneurysm or adenopathy. Reproductive: Prior hysterectomy.  No adnexal masses. Other: No free fluid or free air. Musculoskeletal: No acute bony abnormality. IMPRESSION: Left colonic diverticulosis.  No active diverticulitis. Bilateral hydronephrosis to the level of the bladder. No obstructing stones. Urinary bladder is distended with Foley catheter in place. Aortic atherosclerosis. Electronically Signed   By: Rolm Baptise M.D.   On: 06/22/2021 23:38    Microbiology: Recent Results (from the past 240 hour(s))  Urine Culture     Status: Abnormal (Preliminary result)   Collection Time: 07/06/21  4:16 PM   Specimen: Urine, Catheterized  Result Value Ref Range Status   Specimen Description   Final    URINE, CATHETERIZED Performed at [facility], [address], [place], [place] [address]    Special Requests   Final    NONE Performed at [facility], [address], [place], [place] [address]    Culture (A)  Final    >=100,000 COLONIES/mL PROTEUS MIRABILIS 30,000 COLONIES/mL KLEBSIELLA PNEUMONIAE org 1  SUSCEPTIBILITIES TO FOLLOW Performed at [facility], [place] [address], [place], [place] [address]    Report Status PENDING  Incomplete   Organism ID, Bacteria KLEBSIELLA PNEUMONIAE (A)  Final      Susceptibility   Klebsiella pneumoniae - MIC*    AMPICILLIN RESISTANT Resistant     CEFAZOLIN <=4 SENSITIVE Sensitive     CEFEPIME <=0.12 SENSITIVE Sensitive     CEFTRIAXONE <=0.25 SENSITIVE Sensitive     CIPROFLOXACIN <=0.25 SENSITIVE Sensitive     GENTAMICIN <=1 SENSITIVE Sensitive     IMIPENEM 1 SENSITIVE Sensitive     NITROFURANTOIN 64 INTERMEDIATE Intermediate     TRIMETH/SULFA <=20 SENSITIVE Sensitive     AMPICILLIN/SULBACTAM 8 SENSITIVE Sensitive     PIP/TAZO <=4 SENSITIVE Sensitive     * 30,000 COLONIES/mL KLEBSIELLA PNEUMONIAE   Resp Panel by RT-PCR (Flu A&B, Covid) Nasopharyngeal Swab     Status: None   Collection Time: 07/06/21  9:09 PM   Specimen: Nasopharyngeal Swab; Nasopharyngeal(NP) swabs in vial transport medium  Result Value Ref Range Status   SARS Coronavirus 2 by RT PCR NEGATIVE NEGATIVE Final    Comment: (NOTE) SARS-CoV-2 target nucleic acids are NOT DETECTED.  The SARS-CoV-2 RNA is generally detectable in upper respiratory specimens during the acute phase of infection. The lowest concentration of SARS-CoV-2 viral copies this assay can detect is 138 copies/mL. A negative result does not preclude SARS-Cov-2 infection and should not be used as the sole basis for treatment or other patient management decisions. A negative result may occur with  improper specimen collection/handling, submission of specimen other than nasopharyngeal swab, presence of viral mutation(s) within the areas targeted by this assay, and inadequate number of viral copies(<138 copies/mL). A negative result must be combined with clinical observations, patient history, and epidemiological information. The expected result is Negative.  Fact Sheet for Patients:  EntrepreneurPulse.com.au  Fact Sheet for Healthcare Providers:  IncredibleEmployment.be  This test is no t yet approved or cleared by the [place] FDA  and  has been authorized for detection and/or diagnosis of SARS-CoV-2 by FDA under an Emergency Use Authorization (EUA). This EUA will remain  in effect (meaning this test can be used) for the duration of the COVID-19 declaration under Section 564(b)(1) of the Act, 21 U.S.C.section 360bbb-3(b)(1), unless the authorization is terminated  or revoked sooner.       Influenza A by PCR NEGATIVE NEGATIVE Final   Influenza B by PCR NEGATIVE NEGATIVE Final    Comment: (NOTE) The Xpert Xpress SARS-CoV-2/FLU/RSV plus assay is intended as an aid in the diagnosis of influenza from  Nasopharyngeal swab specimens and should not be used as a sole basis for treatment. Nasal washings and aspirates are unacceptable for Xpert Xpress SARS-CoV-2/FLU/RSV testing.  Fact Sheet for Patients: EntrepreneurPulse.com.au  Fact Sheet for Healthcare Providers: IncredibleEmployment.be  This test is not yet approved or cleared by the [place] FDA and has been authorized for detection and/or diagnosis of SARS-CoV-2 by FDA under an Emergency Use Authorization (EUA). This EUA will remain in effect (meaning this test can be used) for the duration of the COVID-19 declaration under Section 564(b)(1) of the Act, 21 U.S.C. section 360bbb-3(b)(1), unless the authorization is terminated or revoked.  Performed at [facility], [address] [place], [place] [address]      Labs: Basic Metabolic Panel: Recent Labs  Lab 07/06/21 1602 07/07/21 1033 07/08/21 1939 07/09/21 0514  NA 126* 126* 131* 136  K 5.3* 5.0 5.4* 4.9  CL 92* 100 105 107  CO2 23 19* 18* 20*  GLUCOSE 92 156* 184* 99  BUN 44* 36* 21 17  CREATININE 1.09* 1.01* 0.93 0.87  CALCIUM 9.4 8.4* 8.5* 8.7*   Liver Function Tests: Recent Labs  Lab 07/06/21 1602  AST 16  ALT 24  ALKPHOS 98  BILITOT 0.3  PROT 7.7  ALBUMIN 3.4*   CBC: Recent Labs  Lab 07/06/21 1602 07/07/21 1033 07/08/21 1939 07/09/21 0514  WBC 7.6 5.2 5.6 5.6  NEUTROABS 4.4  --   --   --   HGB 9.7* 8.5* 8.8* 8.7*  HCT 29.8* 26.1* 26.4* 26.8*  MCV 89.2 91.3 90.4 91.5  PLT 409* 346 350 341   CBG: Recent Labs  Lab 07/08/21 1130 07/08/21 1619 07/08/21 2110 07/09/21 0734 07/09/21 1156  GLUCAP 169* 114* 148* 101* 193*    Signed:  Barton Dubois MD.  [facility] 07/09/2021, 1:57 PM

## 2021-07-10 ENCOUNTER — Encounter: Payer: Self-pay | Admitting: Adult Health

## 2021-07-10 ENCOUNTER — Non-Acute Institutional Stay (SKILLED_NURSING_FACILITY): Payer: Medicare HMO | Admitting: Adult Health

## 2021-07-10 ENCOUNTER — Other Ambulatory Visit (HOSPITAL_COMMUNITY)
Admission: RE | Admit: 2021-07-10 | Discharge: 2021-07-10 | Disposition: A | Discharge status: Home / Self Care | Payer: Medicare HMO | Source: Skilled Nursing Facility | Attending: Adult Health | Admitting: Adult Health

## 2021-07-10 DIAGNOSIS — S14124S Central cord syndrome at C4 level of cervical spinal cord, sequela: Secondary | ICD-10-CM

## 2021-07-10 DIAGNOSIS — I700 Atherosclerosis of aorta: Secondary | ICD-10-CM | POA: Diagnosis not present

## 2021-07-10 DIAGNOSIS — E1122 Type 2 diabetes mellitus with diabetic chronic kidney disease: Secondary | ICD-10-CM | POA: Diagnosis not present

## 2021-07-10 DIAGNOSIS — G2581 Restless legs syndrome: Secondary | ICD-10-CM

## 2021-07-10 DIAGNOSIS — Z794 Long term (current) use of insulin: Secondary | ICD-10-CM

## 2021-07-10 DIAGNOSIS — K219 Gastro-esophageal reflux disease without esophagitis: Secondary | ICD-10-CM | POA: Diagnosis not present

## 2021-07-10 DIAGNOSIS — E1169 Type 2 diabetes mellitus with other specified complication: Secondary | ICD-10-CM

## 2021-07-10 DIAGNOSIS — E785 Hyperlipidemia, unspecified: Secondary | ICD-10-CM

## 2021-07-10 DIAGNOSIS — I129 Hypertensive chronic kidney disease with stage 1 through stage 4 chronic kidney disease, or unspecified chronic kidney disease: Secondary | ICD-10-CM

## 2021-07-10 DIAGNOSIS — E1159 Type 2 diabetes mellitus with other circulatory complications: Secondary | ICD-10-CM | POA: Insufficient documentation

## 2021-07-10 DIAGNOSIS — R569 Unspecified convulsions: Secondary | ICD-10-CM | POA: Diagnosis not present

## 2021-07-10 DIAGNOSIS — N390 Urinary tract infection, site not specified: Secondary | ICD-10-CM

## 2021-07-10 DIAGNOSIS — G8252 Quadriplegia, C1-C4 incomplete: Secondary | ICD-10-CM

## 2021-07-10 DIAGNOSIS — N183 Chronic kidney disease, stage 3 unspecified: Secondary | ICD-10-CM

## 2021-07-10 DIAGNOSIS — D649 Anemia, unspecified: Secondary | ICD-10-CM

## 2021-07-10 DIAGNOSIS — N1832 Chronic kidney disease, stage 3b: Secondary | ICD-10-CM

## 2021-07-10 DIAGNOSIS — E43 Unspecified severe protein-calorie malnutrition: Secondary | ICD-10-CM

## 2021-07-10 DIAGNOSIS — R339 Retention of urine, unspecified: Secondary | ICD-10-CM

## 2021-07-10 DIAGNOSIS — N1831 Chronic kidney disease, stage 3a: Secondary | ICD-10-CM

## 2021-07-10 LAB — BASIC METABOLIC PANEL
Anion gap: 8 (ref 5–15)
BUN: 14 mg/dL (ref 8–23)
CO2: 21 mmol/L — ABNORMAL LOW (ref 22–32)
Calcium: 9.2 mg/dL (ref 8.9–10.3)
Chloride: 105 mmol/L (ref 98–111)
Creatinine, Ser: 0.88 mg/dL (ref 0.44–1.00)
GFR, Estimated: >60 mL/min (ref >60)
Glucose, Bld: 84 mg/dL (ref 70–99)
Potassium: 4.7 mmol/L (ref 3.5–5.1)
Sodium: 134 mmol/L — ABNORMAL LOW (ref 135–145)

## 2021-07-10 LAB — URINE CULTURE: Culture: >=100000 — AB

## 2021-07-10 NOTE — Progress Notes (Signed)
Location:  [facility] Room Number: 154-W Place of Service:  SNF (31)   CODE STATUS: dnr   Allergies  Allergen Reactions  . Codeine   . Sulfa Antibiotics     Chief Complaint  Patient presents with  . Hospitalization Follow-up    Follow-up from recent hospital stay     HPI:  She is a 85 year old long term resident of this facility who has been hospitalized from 07-06-21-07-09-21. She was in her normal state of health. While with breakfast was found to be unresponsive and staring off into space with some jerking movements present. Her head ct and MRI were without acute problems present. She was treated for her UTI. She denies any pain at this time; denies any nausea or vomiting. Does complain of weakness present. 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 DM type 2 with diabetic peripheral neuropathy ([place])   . High cholesterol   . Neck pain   . Neuropathy     Past Surgical History:  Procedure Laterality Date  . ABDOMINAL HYSTERECTOMY    . ANTERIOR CERVICAL DECOMP/DISCECTOMY FUSION N/A 02/05/2021   Procedure: Cervical Three-Four  Anterior cervical decompression/discectomy/fusion;  Surgeon: Ashok Pall, MD;  Location: [facility];  Service: Neurosurgery;  Laterality: N/A;  RM 20  . APPENDECTOMY    . BACK SURGERY    . CERVICAL DISC SURGERY    . HAND SURGERY    . KNEE SURGERY      Social History   Socioeconomic History  . Marital status: Widowed    Spouse name: Not on file  . Number of children: Not on file  . Years of education: Not on file  . Highest education level: Not on file  Occupational History  . Not on file  Tobacco Use  . Smoking status: Never  . Smokeless tobacco: Never  Vaping Use  . Vaping Use: Never used  Substance and  Sexual Activity  . Alcohol use: Never  . Drug use: Never  . Sexual activity: Never  Other Topics Concern  . Not on file  Social History Narrative  . Not on file   Social Determinants of Health   Financial Resource Strain: Not on file  Food Insecurity: Not on file  Transportation Needs: Not on file  Physical Activity: Not on file  Stress: Not on file  Social Connections: Not on file  Intimate Partner Violence: Not on file   Family History  Problem Relation Age of Onset  . Cardiomyopathy Father   . Cancer Mother       VITAL SIGNS BP (!) 159/69   Pulse 77   Temp (!) 97.1 F (36.2 C)   Resp 20   Ht 5\' 2"  (1.575 m)   Wt 153 lb 8 oz (69.6 kg)   SpO2 96%   BMI 28.08 kg/m   Outpatient Encounter Medications as of 07/10/2021  Medication Sig  . acetaminophen (TYLENOL) 325 MG tablet Take 650 mg by mouth every 8 (eight) hours.  Marland Kitchen albuterol (VENTOLIN HFA) 108 (90 Base) MCG/ACT inhaler Inhale 1 puff into the lungs every 4 (four) hours as needed for wheezing or shortness of breath.  . Artificial Saliva (BIOTENE MOISTURIZING MOUTH MT) Use as directed 1 application in the mouth or throat at bedtime. 9 pm  .  aspirin EC 81 MG tablet Take 81 mg by mouth daily. Swallow whole.9 am  . atorvastatin (LIPITOR) 20 MG tablet Take 1 tablet (20 mg total) by mouth every evening.  Roseanne Kaufman Peru-Castor Oil (VENELEX) OINT Apply topically. Apply to sacrum, coccyx, bilateral buttocks qshift for prevention.  . chlorthalidone (HYGROTON) 25 MG tablet Take 25 mg by mouth daily.  . colchicine 0.6 MG tablet Take 0.6 mg by mouth daily as needed.  . DULoxetine (CYMBALTA) 60 MG capsule Take 60 mg by mouth daily.  Marland Kitchen gabapentin (NEURONTIN) 100 MG capsule Take 1 capsule (100 mg total) by mouth 2 (two) times daily.  Marland Kitchen guaiFENesin (MUCINEX) 600 MG 12 hr tablet Take by mouth 2 (two) times daily.  . insulin glargine (LANTUS) 100 UNIT/ML injection Inject 20 Units into the skin at bedtime.  . insulin lispro (HUMALOG)  100 UNIT/ML KwikPen Inject 5 Units into the skin 2 (two) times daily with a meal.  . levETIRAcetam (KEPPRA) 500 MG tablet Take 1 tablet (500 mg total) by mouth 2 (two) times daily.  Marland Kitchen lisinopril (ZESTRIL) 2.5 MG tablet Take 2.5 mg by mouth daily.  . melatonin 3 MG TABS tablet Take 3 mg by mouth at bedtime.  . metoCLOPramide (REGLAN) 5 MG tablet Take 2.5 mg by mouth 4 (four) times daily.  . mirtazapine (REMERON) 7.5 MG tablet Take 15 mg by mouth at bedtime.  Marland Kitchen omeprazole (PRILOSEC) 40 MG capsule Take 40 mg by mouth daily.  . prochlorperazine (COMPAZINE) 10 MG tablet Take 10 mg by mouth every 6 (six) hours as needed for nausea or vomiting.  Marland Kitchen rOPINIRole (REQUIP) 1 MG tablet Take 1 mg by mouth at bedtime.  Marland Kitchen tiZANidine (ZANAFLEX) 2 MG tablet Take 2 mg by mouth every 6 (six) hours as needed for muscle spasms (for back and sciatic pain).  . [DISCONTINUED] mirtazapine (REMERON) 15 MG tablet Take 15 mg by mouth at bedtime.   No facility-administered encounter medications on file as of 07/10/2021.     SIGNIFICANT DIAGNOSTIC EXAMS  PREVIOUS   12-02-20; ct of head: No acute intracranial abnormality. Polypoid sinus disease.  12-02-20: ct of cervical spine:  Postoperative and degenerative changes in the cervical spine. No acute bony abnormality.  12-02-20; ct of pelvis:  1. No evidence of acute fracture or dislocation of the pelvis or hips. 2. 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 3. Degenerative changes in the hips with subcortical cysts on the left hip.  12-02-20: ct of left knee:  1. Subtle acute nondisplaced fracture through the medial aspect of the head of the fibula. 2. 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 3. There is a small joint effusion.  12-02-20: mri left hip:  1. No acute findings involving the left hip. 2. Mild chronic bilateral hip avascular necrosis. Moderate degenerative hip findings bilaterally. No hip  effusion or regional bursitis. 3. Mild left greater than right hamstring tendinopathy. 4. 1.3 cm left eccentric Bartholin's cyst or Gartner cyst.  12-02-20: mri lumbar spine:  1. 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 2. L3-4: Bilateral posterolateral disc bulges, more prominent on the left. Mild left foraminal encroachment that could possibly affect the left L3 nerve. Moderate to severe bilateral facet osteoarthritis. These findings could relate to back pain or referred facet syndrome pain. 3. L4-5: Bilateral facet arthropathy with 2 mm of anterolisthesis. Bulging of the disc more prominent towards the right. Mild narrowing of the lateral recesses and of the intervertebral foramen on the right, but  without visible neural compression. Findings at this level could relate to back pain or referred facet syndrome pain. 4. L5-S1: Previous left hemilaminectomy. Endplate osteophytes and bulging of the disc more prominent towards the left. Facet degeneration and hypertrophy left more than right with left foraminal stenosis and subarticular lateral recess stenosis could cause left-sided neural compression.   01-25-21: ct of head; maxillofacial cervical spine:  1. No acute intracranial pathology. 2. No acute/traumatic cervical spine pathology. 3. No acute facial bone fractures.  01-26-21: ct of chest abdomen and pelvis:  1. No noncontrast CT evidence of acute traumatic injury to the chest, abdomen, or pelvis. 2. Distended urinary bladder. Mild bilateral hydronephrosis and hydroureter to the bilateral ureterovesicular junctions. No obstructing calculus or other etiology identified. Correlate for urinary retention. 3. No fracture or dislocation of the lumbar spine. Moderate disc space height loss and osteophytosis at L5-S1. Probable small broad-based posterior disc bulges at L4-L5 and L5-S1. 4. Coronary artery disease. Aortic Atherosclerosis  01-26-21: ct of right shoulder:  1.  No acute  osseous injury of the right shoulder. 2. Mild mineralization in the infraspinatus tendon as can be seen with calcific tendinosis.  01-29-21: MRI of spine:  Postoperative and multilevel degenerative changes of the cervical spine. There is severe canal stenosis with cord compression at C3-C4. Abnormal cord signal is present just below this level (at operative levels) and may reflect edema or myelomalacia No significant degenerative changes of the thoracic spine. Multilevel degenerative changes of lumbar spine similar to recent prior study.  01-30-21: renal ultrasound:  1. No acute abnormality. Small amount of right perinephric fluid, nonspecific.  02-05-21: chest x-ray:  1. Right internal jugular central venous catheter with tip over the mid SVC. No pneumothorax. 2. Hazy lung base opacities likely represent combination of atelectasis and pleural fluid. Vascular congestion.   03-06-21 chest x-ray: left lower lobe infiltrate likely representing pneumonia   03-26-21: lumbar x-ray: mild to moderate degenerative changes in lumbar spine  03-27-21; DEXA scan: t score -1.777  06-22-21: ct of abdomen and pelvis:  Left colonic diverticulosis.  No active diverticulitis. Bilateral hydronephrosis to the level of the bladder. No obstructing stones. Urinary bladder is distended with Foley catheter in place. Aortic atherosclerosis.  TODAY  07-06-21: ct head:  No intracranial abnormality or other acute findings. Chronic bilateral maxillary sinus disease.  07-06-21: ct of abdomen and pelvis:  1. Heterogeneous enhancement of the left kidney with mild left perinephric edema, suspicious for pyelonephritis. There is also mild bladder wall thickening and perivesicular fat stranding with Foley catheter in place. Improvement in bilateral hydronephrosis from prior exam. 2. Stool distends the rectum at 7.6 cm with mild distal rectal wall thickening, suspicious for fecal impaction. 3. Colonic diverticulosis without  diverticulitis. Aortic Atherosclerosis   07-07-21: MRI of head:  No acute intracranial finding. No sign of acute infarction. No lesion seen to explain seizure. The patient does have mild chronic small-vessel ischemic changes considering age affecting the pons, thalami and cerebral hemispheric white matter. Left mastoid effusion      LABS REVIEWED PREVIOUS   12-02-20: wbc 6.4; hgb 11.3; hct 34.4; mcv 93.0 plt 216; glucose 209; bun 33; creat 1.13; k+ 4.3; na++ 137; ca 9.0; GFR 49; hgb a1c 8.6 12-03-20: wbc 6.5; hgb 9.8; hct 30.5 mcv 93.8 plt 182; glucose 188;bun 41; creat 1.49; k+ 4.4; na++ 134; ca 8.2; GFR 35 12-04-20; glucose 285; bun 29; creat 1.00; k+ 4.3; na++ 138; ca 8.7 GFR 56  01-25-21: wbc 6.4; hgb 10.6;  hct 31.7; mcv 91.1 plt 227; glucose 152; bun 40; creat 1.2 k+ 4.2; na++ 135; ca 8.7; GFR 42; liver normal albumin 3.5 01-29-21: urine and blood culture: klebsiella pneumoniae 01-31-21: wbc 13.1; hgb 8.3; hct 23.6; mcv 88.1 plt 153; glucose 162; bun 67; creat 2.77; k+ 4.8; na++ 125; ca 7.5; GFR 17; ast 62; alt 57; albumin 1.7 02-04-21: wbc 9.8; hgb 7.6; hct 22.0; mcv 85.3 plt 212; glucose 161 bun 40; creat 1.40; k+ 3.4; na++ 127; ca 7.5; GFR 38; ast 62; alt 57; albumin 1.7  02-08-21: wbc 10.4; hgb 9.2; hct 27.6; mcv 87.3 plt 433; glucose 186; bun 23; creat 1.00 ;k+ 4.4; na++ 131; ca 8.1; GFR 56; ast 60; alt 61; albumin 2.1  02-20-21: hgb 9.4; hct 29.9 glucose 154; bun 51; creat 1.17; k+ 4.5; na++ 136; ca 8.9; GFR 47 02-24-21: glucose 175; bun 62; creat 1.13; k+ 4.5; na++ 133; ca 9.0 GFR 49 03-06-21: wbc 5.3; hgb 8.8; hct 27.1; mcv 93.4 plt 262; glucose 453; bun 66; creat 1.30; k+ 4.6; na++ 135; ca 9.0; GFR 41; d-dimer: 2.16; CRP 3.0  03-13-21: d-dimer: 1.49 03-20-21: hep C nr; d-dimer 0.86 03-27-21: d-dimer 0.52 03-31-21: wbc 5.5; hgb 8.5; hct 26.8; mcv 93.4 plt 203; hgb a1c 7.6; chol 130; ldl 68; trig 138; hdl 34; urine micro-albumin 25.1 06-12-21: uric acid 5.9 06-15-21: wbc 6.0; hgb 9.8; hct 29.4; mcv  90,7 plt 242; glucose 187; bun 65; creat 1.33; k+ 4.2; na++ 131; ca 8.9; GFR 40; liver normal albumin 3.3  06-19-21: wbc 7.3; hgb 9.9; hct 30.1; mcv 89,9 plt 306; glucose 115; bun 29; creat 1.05; k+ 4.1; na++ 131; ca 8.7; GFR 53; liver normal albumin 3.1; urine micro-albumin 85.3 06-22-21; wbc 11.8; hgb 10.4; hct 30.9; mcv 89,3 plt 375; glucose 288; bun 44; creat 1.46; k+ 3.5; na++ 125; ca 8.3 GFR 36; liver normal albumin 3.3; lipase 37   TODAY  07-06-21: wbc 7.6; hgb 9.7; hct 29.8; mcv 89,2 plt 409; glucose 92; bun 44; creat 1.09; k+ 5,3l na++ 136; ca 9.4; GFR 51; liver normal albumin 3.4 Hgb A1C: 8.3; urine culture: proteus mirabilis; + 30,000 colonies klebsiella pneumoniae   Review of Systems  Constitutional:  Negative for malaise/fatigue.  Respiratory:  Negative for cough and shortness of breath.   Cardiovascular:  Negative for chest pain, palpitations and leg swelling.  Gastrointestinal:  Negative for abdominal pain, constipation and heartburn.  Musculoskeletal:  Negative for back pain, joint pain and myalgias.  Skin: Negative.   Neurological:  Negative for dizziness.  Psychiatric/Behavioral:  The patient is not nervous/anxious.     Physical Exam Constitutional:      General: She is not in acute distress.    Appearance: She is well-developed. She is not diaphoretic.  Neck:     Thyroid: No thyromegaly.  Cardiovascular:     Rate and Rhythm: Normal rate and regular rhythm.     Heart sounds: Normal heart sounds.  Pulmonary:     Effort: Pulmonary effort is normal. No respiratory distress.     Breath sounds: Normal breath sounds.  Abdominal:     General: Bowel sounds are normal. There is no distension.     Palpations: Abdomen is soft.     Tenderness: There is no abdominal tenderness.  Musculoskeletal:        General: Normal range of motion.     Cervical back: Neck supple.     Right lower leg: No edema.     Left lower leg: No edema.  Lymphadenopathy:     Cervical: No cervical  adenopathy.  Skin:    General: Skin is warm and dry.  Neurological:     Mental Status: She is alert and oriented to person, place, and time.     Comments: Has weakness in all extremities Bilateral grips are weak      ASSESSMENT/ PLAN:   TODAY  Seizures: new onset: will continue keppra 500 mg twice daily   2. UTI: has completed abt and will monitor her status.   3 Hyperlipidemia associated with type 2 diabetes mellitus: is stable LDL 68 will continue lipitor 20 mg daily   4. Hypertension associated with type 2 diabetes mellitus: is stable b/p 148/89 will continue hygroton 25 mg daily; is off lisinopril due to renal failure  5. GERD without esophagitis: is stable will continue prilosec 40 mg daily   6. Chronic constipation: currently not on medications will monitor    7. Lumbar radicular syndrome/vascular necrosis bilateral hips: is without change will continue tylenol 650 mg every 8 hours and gabapentin 100 mg  twice daily; is off prednisone and robaxin   8. Diabetic peripheral neuropathy: is stable will continue gabapentin 100 mg twice daily   9. 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 Normocytic anemia: is stable hgb 8.7 will monitor   11. Urine retention: is stable has foley will monitor   12. Chronic anxiety: is stable will continue remeron 15 mg nightly   13. Protein calorie malnutrition severe: is without change: albumin 3.4 will continue supplements as directed   14. Aortic atherosclerosis: ( ct 01-26-21) will monitor   15. Central cord syndrome at C4 level of cervical spine cord subsequent encounter: cord compression; quadriplegia C1-4 incomplete: is without change in status: will continue  asa 81 mg daily;   16. Restless leg syndrome: is stable will continue requip 1 mg nightly  .  17. Type 2 diabetes mellitus with diabetic neuropathy with long term current use of insulin: hgb a1c 8.3. is off metformin. Will continue  lantus 20 units nightly humalog 5 units twice daily with meals.     [name] NP [facility]  Contact [phone] Monday through Friday 8am- 5pm  After hours call [phone]

## 2021-07-11 ENCOUNTER — Non-Acute Institutional Stay (SKILLED_NURSING_FACILITY): Payer: Medicare HMO | Admitting: Internal Medicine

## 2021-07-11 DIAGNOSIS — Z794 Long term (current) use of insulin: Secondary | ICD-10-CM

## 2021-07-11 DIAGNOSIS — E1122 Type 2 diabetes mellitus with diabetic chronic kidney disease: Secondary | ICD-10-CM

## 2021-07-11 DIAGNOSIS — R569 Unspecified convulsions: Secondary | ICD-10-CM

## 2021-07-11 DIAGNOSIS — N390 Urinary tract infection, site not specified: Secondary | ICD-10-CM | POA: Diagnosis not present

## 2021-07-11 DIAGNOSIS — D649 Anemia, unspecified: Secondary | ICD-10-CM

## 2021-07-11 DIAGNOSIS — E871 Hypo-osmolality and hyponatremia: Secondary | ICD-10-CM | POA: Diagnosis not present

## 2021-07-11 DIAGNOSIS — N1831 Chronic kidney disease, stage 3a: Secondary | ICD-10-CM

## 2021-07-11 NOTE — Patient Instructions (Signed)
See assessment and plan under each diagnosis in the problem list and acutely for this visit 

## 2021-07-11 NOTE — Assessment & Plan Note (Signed)
No GU symptoms at this time.  Indwelling Foley catheter present.

## 2021-07-11 NOTE — Assessment & Plan Note (Signed)
Neurology outpatient follow-up to be scheduled in the near future.

## 2021-07-11 NOTE — Progress Notes (Signed)
NURSING HOME LOCATION:  [facility] ROOM NUMBER:  [id number] W  CODE STATUS:  DNR  PCP:  Judd Lien MD  This is a nursing facility follow up visit for [name] readmission within 30 days  Interim medical record and care since last SNF visit was updated with review of diagnostic studies and change in clinical status since last visit were documented.  HPI: Patient was rehospitalized 11/6 - 07/09/2021  with acute unresponsiveness here @ SNF.  The daughter was with the patient while she was eating breakfast.  Apparently after eating a few bites of oatmeal the patient was noted to be staring and unresponsive.  At that point she exhibited brief shaking episodes of the upper extremities and then became completely unresponsive.  This is in the context of a remote history of seizures according to the daughter. Head CT was negative.  Neurology consultant recommended Keppra and MRI which also was unrevealing.  There was a question of possible dysphagia which may been related to the acute encephalopathy.  Speech therapy recommended regular diet with thin liquids.  Patient has an indwelling catheter and urinalysis revealed white blood cells without bacteria.  Empirically ceftriaxone  was initiated and urine cultured.  Culture revealed greater than 100,000 colonies of Proteus mirabilis and only 30,000 colonies of Klebsiella pneumonia. Sodium was 126; chronic hyponatremia is pre-existing.  With fluid resuscitation electrolytes improved and sodium was 136 at discharge. She described numbness of her hands and lower extremities bilaterally.  Gabapentin 100 mg twice daily was initiated 11/8.  This is in the context of known central cord syndrome at C4. Diabetes control was fair with an A1c of 8.3% on 11/6.  Modified carbohydrate intake was recommended.  She was to continue Lantus as well as NovoLog meal coverage.  Review of systems: She is cognizant of the reason for her hospitalization  stating that "I had a bladder infection and seizure".She validates that she had history of seizures approximately 20 years ago and was on Dilantin for a year.  Apparently she stopped it on her own "because I did not have anymore seizures".  Her daughter states that she had a seizure 4-5 years ago but this was not treated.  She describes chronic numbness in her extremities and part of her buttocks. She has anorexia but no other GI symptoms. She describes gout in the right index and third digit.  Constitutional: No fever, significant weight change, fatigue  Eyes: No redness, discharge, pain, vision change ENT/mouth: No nasal congestion,  purulent discharge, earache, change in hearing, sore throat  Cardiovascular: No chest pain, palpitations, paroxysmal nocturnal dyspnea, claudication, edema  Respiratory: No cough, sputum production, hemoptysis, DOE, significant snoring, apnea   Gastrointestinal: No heartburn, dysphagia, abdominal pain, nausea /vomiting, rectal bleeding, melena, change in bowels Genitourinary: No dysuria, hematuria, pyuria, incontinence, nocturia Dermatologic: No rash, pruritus, change in appearance of skin Neurologic: No dizziness, headache Psychiatric: No  insomnia Endocrine: No change in hair/skin/nails, excessive thirst, excessive hunger, excessive urination  Hematologic/lymphatic: No significant bruising, lymphadenopathy, abnormal bleeding Allergy/immunology: No itchy/watery eyes, significant sneezing, urticaria, angioedema  Physical exam:  Pertinent or positive findings: Facies are somewhat perplexed or sad in appearance.  Ptosis is present on the right.  She has complete dentures.  She has low-grade expiratory wheezing on the right.Foley catheter is in place.  She has trace edema.  Dorsalis pedis pulses are stronger than posterior tibial pulses.  Slight swelling of the first and second right fingers were suggested.  She is  weak to opposition in all extremities.  General  appearance:  no acute distress or increased work of breathing is present.   Lymphatic: No lymphadenopathy about the head, neck, axilla. Eyes: No conjunctival inflammation or lid edema is present. There is no scleral icterus. Ears:  External ear exam shows no significant lesions or deformities.   Nose:  External nasal examination shows no deformity or inflammation. Nasal mucosa are pink and moist without lesions, exudates Oral exam:  There is no oropharyngeal erythema or exudate. Neck:  No thyromegaly, masses, tenderness noted.    Heart:  Normal rate and regular rhythm. S1 and S2 normal without gallop, murmur, click, rub .  Lungs:  without  rhonchi, rales, rubs. Abdomen: Bowel sounds are normal. Abdomen is soft and nontender with no organomegaly, hernias, masses. GU: Deferred  Extremities:  No cyanosis, clubbing Neurologic exam :Balance, Rhomberg, finger to nose testing could not be completed due to clinical state 11/6-Skin: Warm & dry w/o tenting. No significant lesions or rash.  See summary under each active problem in the Problem List with associated updated therapeutic plan

## 2021-07-11 NOTE — Assessment & Plan Note (Addendum)
Glucose range as inpatient 84-184.  A1c of 8.3% suggest fair control. Current creatinine 0.88 GFR greater than 60 indicating CKD stage 2.  She has been on ARB; no change in dose is indicated

## 2021-07-11 NOTE — Assessment & Plan Note (Signed)
H/H has ranged from 8.7/26.8 up to 9.7/29.8 with normochromic, normocytic indices.  No active bleeding dyscrasias reported.

## 2021-07-14 ENCOUNTER — Encounter: Payer: Self-pay | Admitting: Internal Medicine

## 2021-07-14 NOTE — Assessment & Plan Note (Addendum)
Monitor Na+ & D/C Hygroton if  hyponatremia recurs. If hyponatremia present off Hygroton,consider SIADH workup

## 2021-07-17 DIAGNOSIS — S14124D Central cord syndrome at C4 level of cervical spinal cord, subsequent encounter: Secondary | ICD-10-CM | POA: Diagnosis not present

## 2021-07-17 DIAGNOSIS — M25611 Stiffness of right shoulder, not elsewhere classified: Secondary | ICD-10-CM | POA: Diagnosis not present

## 2021-07-17 DIAGNOSIS — M25511 Pain in right shoulder: Secondary | ICD-10-CM | POA: Diagnosis not present

## 2021-07-18 DIAGNOSIS — S14124D Central cord syndrome at C4 level of cervical spinal cord, subsequent encounter: Secondary | ICD-10-CM | POA: Diagnosis not present

## 2021-07-18 DIAGNOSIS — M25611 Stiffness of right shoulder, not elsewhere classified: Secondary | ICD-10-CM | POA: Diagnosis not present

## 2021-07-18 DIAGNOSIS — M25511 Pain in right shoulder: Secondary | ICD-10-CM | POA: Diagnosis not present

## 2021-07-21 DIAGNOSIS — M25511 Pain in right shoulder: Secondary | ICD-10-CM | POA: Diagnosis not present

## 2021-07-21 DIAGNOSIS — M25611 Stiffness of right shoulder, not elsewhere classified: Secondary | ICD-10-CM | POA: Diagnosis not present

## 2021-07-21 DIAGNOSIS — S14124D Central cord syndrome at C4 level of cervical spinal cord, subsequent encounter: Secondary | ICD-10-CM | POA: Diagnosis not present

## 2021-07-22 DIAGNOSIS — M25511 Pain in right shoulder: Secondary | ICD-10-CM | POA: Diagnosis not present

## 2021-07-22 DIAGNOSIS — M25611 Stiffness of right shoulder, not elsewhere classified: Secondary | ICD-10-CM | POA: Diagnosis not present

## 2021-07-22 DIAGNOSIS — S14124D Central cord syndrome at C4 level of cervical spinal cord, subsequent encounter: Secondary | ICD-10-CM | POA: Diagnosis not present

## 2021-07-23 DIAGNOSIS — M25511 Pain in right shoulder: Secondary | ICD-10-CM | POA: Diagnosis not present

## 2021-07-23 DIAGNOSIS — S14124D Central cord syndrome at C4 level of cervical spinal cord, subsequent encounter: Secondary | ICD-10-CM | POA: Diagnosis not present

## 2021-07-23 DIAGNOSIS — M25611 Stiffness of right shoulder, not elsewhere classified: Secondary | ICD-10-CM | POA: Diagnosis not present

## 2021-07-24 DIAGNOSIS — S14124D Central cord syndrome at C4 level of cervical spinal cord, subsequent encounter: Secondary | ICD-10-CM | POA: Diagnosis not present

## 2021-07-24 DIAGNOSIS — M25611 Stiffness of right shoulder, not elsewhere classified: Secondary | ICD-10-CM | POA: Diagnosis not present

## 2021-07-24 DIAGNOSIS — M25511 Pain in right shoulder: Secondary | ICD-10-CM | POA: Diagnosis not present

## 2021-07-25 DIAGNOSIS — M25611 Stiffness of right shoulder, not elsewhere classified: Secondary | ICD-10-CM | POA: Diagnosis not present

## 2021-07-25 DIAGNOSIS — S14124D Central cord syndrome at C4 level of cervical spinal cord, subsequent encounter: Secondary | ICD-10-CM | POA: Diagnosis not present

## 2021-07-25 DIAGNOSIS — M25511 Pain in right shoulder: Secondary | ICD-10-CM | POA: Diagnosis not present

## 2021-07-28 ENCOUNTER — Encounter: Payer: Self-pay | Admitting: Adult Health

## 2021-07-28 ENCOUNTER — Non-Acute Institutional Stay (SKILLED_NURSING_FACILITY): Payer: Medicare HMO | Admitting: Adult Health

## 2021-07-28 DIAGNOSIS — R569 Unspecified convulsions: Secondary | ICD-10-CM | POA: Diagnosis not present

## 2021-07-28 DIAGNOSIS — M25511 Pain in right shoulder: Secondary | ICD-10-CM | POA: Diagnosis not present

## 2021-07-28 DIAGNOSIS — M25611 Stiffness of right shoulder, not elsewhere classified: Secondary | ICD-10-CM | POA: Diagnosis not present

## 2021-07-28 DIAGNOSIS — S14124D Central cord syndrome at C4 level of cervical spinal cord, subsequent encounter: Secondary | ICD-10-CM | POA: Diagnosis not present

## 2021-07-28 NOTE — Progress Notes (Signed)
Location:  [facility] Room Number: 154-W Place of Service:  SNF (31)   CODE STATUS: DNR  Allergies  Allergen Reactions   Codeine    Sulfa Antibiotics     Chief Complaint  Patient presents with   Acute Visit    Questionable Seizure    HPI:  On  this past Friday; she went to the eye doctor for her exam. She what appeared to be seizure activity. She stared off to one side and bilateral upper extremity rhythmic movement. Lasted for less then 1-2 minutes. She has not had further seizure activity. She is presently taking keppra 500 mg twice daily   Past Medical History:  Diagnosis Date   DM type 2 with diabetic peripheral neuropathy (HCC)    High cholesterol    Neck pain    Neuropathy     Past Surgical History:  Procedure Laterality Date   ABDOMINAL HYSTERECTOMY     ANTERIOR CERVICAL DECOMP/DISCECTOMY FUSION N/A 02/05/2021   Procedure: Cervical Three-Four  Anterior cervical decompression/discectomy/fusion;  Surgeon: Ashok Pall, MD;  Location: [facility];  Service: Neurosurgery;  Laterality: N/A;  RM 20   APPENDECTOMY     BACK SURGERY     CERVICAL DISC SURGERY     HAND SURGERY     KNEE SURGERY      Social History   Socioeconomic History   Marital status: Widowed    Spouse name: Not on file   Number of children: Not on file   Years of education: Not on file   Highest education level: Not on file  Occupational History   Not on file  Tobacco Use   Smoking status: Never   Smokeless tobacco: Never  Vaping Use   Vaping Use: Never used  Substance and Sexual Activity   Alcohol use: Never   Drug use: Never   Sexual activity: Never  Other Topics Concern   Not on file  Social History Narrative   Not on file   Social Determinants of Health   Financial Resource Strain: Not on file  Food Insecurity: Not on file  Transportation Needs: Not on file  Physical Activity: Not on file  Stress: Not on file  Social Connections: Not on file  Intimate  Partner Violence: Not on file   Family History  Problem Relation Age of Onset   Cardiomyopathy Father    Cancer Mother       VITAL SIGNS BP 140/87   Pulse 77   Temp 98 F (36.7 C)   Resp 18   Ht 5\' 2"  (1.575 m)   Wt 153 lb 8 oz (69.6 kg)   SpO2 95%   BMI 28.08 kg/m   Outpatient Encounter Medications as of 07/28/2021  Medication Sig   acetaminophen (TYLENOL) 325 MG tablet Take 650 mg by mouth every 8 (eight) hours.   albuterol (VENTOLIN HFA) 108 (90 Base) MCG/ACT inhaler Inhale 1 puff into the lungs every 4 (four) hours as needed for wheezing or shortness of breath.   Artificial Saliva (BIOTENE MOISTURIZING MOUTH MT) Use as directed 1 application in the mouth or throat at bedtime. 9 pm   aspirin EC 81 MG tablet Take 81 mg by mouth daily. Swallow whole.9 am   atorvastatin (LIPITOR) 20 MG tablet Take 1 tablet (20 mg total) by mouth every evening.   Balsam [place]-Castor Oil (VENELEX) OINT Apply topically. Apply to sacrum, coccyx, bilateral buttocks qshift for prevention.   chlorthalidone (HYGROTON) 25 MG tablet Take 25 mg by  mouth daily.   colchicine 0.6 MG tablet Take 0.6 mg by mouth daily as needed.   DULoxetine (CYMBALTA) 60 MG capsule Take 60 mg by mouth daily.   gabapentin (NEURONTIN) 100 MG capsule Take 1 capsule (100 mg total) by mouth 2 (two) times daily.   guaiFENesin (MUCINEX) 600 MG 12 hr tablet Take by mouth 2 (two) times daily.   insulin glargine (LANTUS) 100 UNIT/ML injection Inject 20 Units into the skin at bedtime.   insulin lispro (HUMALOG) 100 UNIT/ML KwikPen Inject 5 Units into the skin 2 (two) times daily with a meal.   levETIRAcetam (KEPPRA) 500 MG tablet Take 1 tablet (500 mg total) by mouth 2 (two) times daily.   lisinopril (ZESTRIL) 2.5 MG tablet Take 2.5 mg by mouth daily.   melatonin 3 MG TABS tablet Take 3 mg by mouth at bedtime.   metoCLOPramide (REGLAN) 5 MG tablet Take 2.5 mg by mouth 4 (four) times daily.   mirtazapine (REMERON) 7.5 MG tablet Take 15  mg by mouth at bedtime.   NON FORMULARY Diet - NAS, Cons CHO   omeprazole (PRILOSEC) 40 MG capsule Take 40 mg by mouth daily.   rOPINIRole (REQUIP) 1 MG tablet Take 1 mg by mouth at bedtime.   tiZANidine (ZANAFLEX) 2 MG tablet Take 2 mg by mouth every 6 (six) hours as needed for muscle spasms (for back and sciatic pain).   prochlorperazine (COMPAZINE) 10 MG tablet Take 10 mg by mouth every 6 (six) hours as needed for nausea or vomiting.   No facility-administered encounter medications on file as of 07/28/2021.     SIGNIFICANT DIAGNOSTIC EXAMS  PREVIOUS   12-02-20; ct of head: No acute intracranial abnormality. Polypoid sinus disease.  12-02-20: ct of cervical spine:  Postoperative and degenerative changes in the cervical spine. No acute bony abnormality.  12-02-20; ct of pelvis:  1. No evidence of acute fracture or dislocation of the pelvis or hips. 2. 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 3. Degenerative changes in the hips with subcortical cysts on the left hip.  12-02-20: ct of left knee:  1. Subtle acute nondisplaced fracture through the medial aspect of the head of the fibula. 2. 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 3. There is a small joint effusion.  12-02-20: mri left hip:  1. No acute findings involving the left hip. 2. Mild chronic bilateral hip avascular necrosis. Moderate degenerative hip findings bilaterally. No hip effusion or regional bursitis. 3. Mild left greater than right hamstring tendinopathy. 4. 1.3 cm left eccentric Bartholin's cyst or Gartner cyst.  12-02-20: mri lumbar spine:  1. 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 2. L3-4: Bilateral posterolateral disc bulges, more prominent on the left. Mild left foraminal encroachment that could possibly affect the left L3 nerve. Moderate to severe bilateral facet osteoarthritis. These findings could relate to back pain or  referred facet syndrome pain. 3. L4-5: Bilateral facet arthropathy with 2 mm of anterolisthesis. Bulging of the disc more prominent towards the right. Mild narrowing of the lateral recesses and of the intervertebral foramen on the right, but without visible neural compression. Findings at this level could relate to back pain or referred facet syndrome pain. 4. L5-S1: Previous left hemilaminectomy. Endplate osteophytes and bulging of the disc more prominent towards the left. Facet degeneration and hypertrophy left more than right with left foraminal stenosis and subarticular lateral recess stenosis could cause left-sided neural compression.   01-25-21: ct of head; maxillofacial cervical spine:  1. No acute intracranial pathology. 2. No acute/traumatic cervical spine pathology. 3. No acute facial bone fractures.  01-26-21: ct of chest abdomen and pelvis:  1. No noncontrast CT evidence of acute traumatic injury to the chest, abdomen, or pelvis. 2. Distended urinary bladder. Mild bilateral hydronephrosis and hydroureter to the bilateral ureterovesicular junctions. No obstructing calculus or other etiology identified. Correlate for urinary retention. 3. No fracture or dislocation of the lumbar spine. Moderate disc space height loss and osteophytosis at L5-S1. Probable small broad-based posterior disc bulges at L4-L5 and L5-S1. 4. Coronary artery disease. Aortic Atherosclerosis  01-26-21: ct of right shoulder:  1.  No acute osseous injury of the right shoulder. 2. Mild mineralization in the infraspinatus tendon as can be seen with calcific tendinosis.  01-29-21: MRI of spine:  Postoperative and multilevel degenerative changes of the cervical spine. There is severe canal stenosis with cord compression at C3-C4. Abnormal cord signal is present just below this level (at operative levels) and may reflect edema or myelomalacia No significant degenerative changes of the thoracic spine. Multilevel degenerative  changes of lumbar spine similar to recent prior study.  01-30-21: renal ultrasound:  1. No acute abnormality. Small amount of right perinephric fluid, nonspecific.  02-05-21: chest x-ray:  1. Right internal jugular central venous catheter with tip over the mid SVC. No pneumothorax. 2. Hazy lung base opacities likely represent combination of atelectasis and pleural fluid. Vascular congestion.   03-06-21 chest x-ray: left lower lobe infiltrate likely representing pneumonia   03-26-21: lumbar x-ray: mild to moderate degenerative changes in lumbar spine  03-27-21; DEXA scan: t score -1.777  06-22-21: ct of abdomen and pelvis:  Left colonic diverticulosis.  No active diverticulitis. Bilateral hydronephrosis to the level of the bladder. No obstructing stones. Urinary bladder is distended with Foley catheter in place. Aortic atherosclerosis.  07-06-21: ct head:  No intracranial abnormality or other acute findings. Chronic bilateral maxillary sinus disease.  07-06-21: ct of abdomen and pelvis:  1. Heterogeneous enhancement of the left kidney with mild left perinephric edema, suspicious for pyelonephritis. There is also mild bladder wall thickening and perivesicular fat stranding with Foley catheter in place. Improvement in bilateral hydronephrosis from prior exam. 2. Stool distends the rectum at 7.6 cm with mild distal rectal wall thickening, suspicious for fecal impaction. 3. Colonic diverticulosis without diverticulitis. Aortic Atherosclerosis   07-07-21: MRI of head:  No acute intracranial finding. No sign of acute infarction. No lesion seen to explain seizure. The patient does have mild chronic small-vessel ischemic changes considering age affecting the pons, thalami and cerebral hemispheric white matter. Left mastoid effusion  NO NEW EXAMS.      LABS REVIEWED PREVIOUS   12-02-20: wbc 6.4; hgb 11.3; hct 34.4; mcv 93.0 plt 216; glucose 209; bun 33; creat 1.13; k+ 4.3; na++ 137; ca 9.0; GFR 49;  hgb a1c 8.6 12-03-20: wbc 6.5; hgb 9.8; hct 30.5 mcv 93.8 plt 182; glucose 188;bun 41; creat 1.49; k+ 4.4; na++ 134; ca 8.2; GFR 35 12-04-20; glucose 285; bun 29; creat 1.00; k+ 4.3; na++ 138; ca 8.7 GFR 56  01-25-21: wbc 6.4; hgb 10.6; hct 31.7; mcv 91.1 plt 227; glucose 152; bun 40; creat 1.2 k+ 4.2; na++ 135; ca 8.7; GFR 42; liver normal albumin 3.5 01-29-21: urine and blood culture: klebsiella pneumoniae 01-31-21: wbc 13.1; hgb 8.3; hct 23.6; mcv 88.1 plt 153; glucose 162; bun 67; creat 2.77; k+ 4.8; na++ 125; ca 7.5; GFR 17; ast 62; alt 57; albumin 1.7 02-04-21: wbc 9.8;  hgb 7.6; hct 22.0; mcv 85.3 plt 212; glucose 161 bun 40; creat 1.40; k+ 3.4; na++ 127; ca 7.5; GFR 38; ast 62; alt 57; albumin 1.7  02-08-21: wbc 10.4; hgb 9.2; hct 27.6; mcv 87.3 plt 433; glucose 186; bun 23; creat 1.00 ;k+ 4.4; na++ 131; ca 8.1; GFR 56; ast 60; alt 61; albumin 2.1  02-20-21: hgb 9.4; hct 29.9 glucose 154; bun 51; creat 1.17; k+ 4.5; na++ 136; ca 8.9; GFR 47 02-24-21: glucose 175; bun 62; creat 1.13; k+ 4.5; na++ 133; ca 9.0 GFR 49 03-06-21: wbc 5.3; hgb 8.8; hct 27.1; mcv 93.4 plt 262; glucose 453; bun 66; creat 1.30; k+ 4.6; na++ 135; ca 9.0; GFR 41; d-dimer: 2.16; CRP 3.0  03-13-21: d-dimer: 1.49 03-20-21: hep C nr; d-dimer 0.86 03-27-21: d-dimer 0.52 03-31-21: wbc 5.5; hgb 8.5; hct 26.8; mcv 93.4 plt 203; hgb a1c 7.6; chol 130; ldl 68; trig 138; hdl 34; urine micro-albumin 25.1 06-12-21: uric acid 5.9 06-15-21: wbc 6.0; hgb 9.8; hct 29.4; mcv 90,7 plt 242; glucose 187; bun 65; creat 1.33; k+ 4.2; na++ 131; ca 8.9; GFR 40; liver normal albumin 3.3  06-19-21: wbc 7.3; hgb 9.9; hct 30.1; mcv 89,9 plt 306; glucose 115; bun 29; creat 1.05; k+ 4.1; na++ 131; ca 8.7; GFR 53; liver normal albumin 3.1; urine micro-albumin 85.3 06-22-21; wbc 11.8; hgb 10.4; hct 30.9; mcv 89,3 plt 375; glucose 288; bun 44; creat 1.46; k+ 3.5; na++ 125; ca 8.3 GFR 36; liver normal albumin 3.3; lipase 37  07-06-21: wbc 7.6; hgb 9.7; hct 29.8; mcv 89,2 plt  409; glucose 92; bun 44; creat 1.09; k+ 5,3l na++ 136; ca 9.4; GFR 51; liver normal albumin 3.4 Hgb A1C: 8.3; urine culture: proteus mirabilis; + 30,000 colonies klebsiella pneumoniae   NO NEW LABS.   Review of Systems  Constitutional:  Negative for malaise/fatigue.  Respiratory:  Negative for cough and shortness of breath.   Cardiovascular:  Negative for chest pain, palpitations and leg swelling.  Gastrointestinal:  Negative for abdominal pain, constipation and heartburn.  Musculoskeletal:  Negative for back pain, joint pain and myalgias.  Skin: Negative.   Neurological:  Negative for dizziness.  Psychiatric/Behavioral:  The patient is not nervous/anxious.     Physical Exam Constitutional:      General: She is not in acute distress.    Appearance: She is well-developed. She is not diaphoretic.  Neck:     Thyroid: No thyromegaly.  Cardiovascular:     Rate and Rhythm: Normal rate and regular rhythm.     Heart sounds: Normal heart sounds.  Pulmonary:     Effort: Pulmonary effort is normal. No respiratory distress.     Breath sounds: Normal breath sounds.  Abdominal:     General: Bowel sounds are normal. There is no distension.     Palpations: Abdomen is soft.     Tenderness: There is no abdominal tenderness.  Musculoskeletal:        General: Normal range of motion.     Cervical back: Neck supple.     Right lower leg: No edema.     Left lower leg: No edema.  Lymphadenopathy:     Cervical: No cervical adenopathy.  Skin:    General: Skin is warm and dry.  Neurological:     Mental Status: She is alert and oriented to person, place, and time.     Comments:  Has weakness in all extremities Bilateral grips are weak   Psychiatric:  Mood and Affect: Mood normal.      ASSESSMENT/ PLAN:  TODAY  Seizures: is worse; will increase keppra to 750 mg twice daily and will monitor her status.    [name] NP [facility]  Contact [phone] Monday through  Friday 8am- 5pm  After hours call [phone]

## 2021-07-29 DIAGNOSIS — M25611 Stiffness of right shoulder, not elsewhere classified: Secondary | ICD-10-CM | POA: Diagnosis not present

## 2021-07-29 DIAGNOSIS — M25511 Pain in right shoulder: Secondary | ICD-10-CM | POA: Diagnosis not present

## 2021-07-29 DIAGNOSIS — S14124D Central cord syndrome at C4 level of cervical spinal cord, subsequent encounter: Secondary | ICD-10-CM | POA: Diagnosis not present

## 2021-07-30 DIAGNOSIS — M25511 Pain in right shoulder: Secondary | ICD-10-CM | POA: Diagnosis not present

## 2021-07-30 DIAGNOSIS — M25611 Stiffness of right shoulder, not elsewhere classified: Secondary | ICD-10-CM | POA: Diagnosis not present

## 2021-07-30 DIAGNOSIS — S14124D Central cord syndrome at C4 level of cervical spinal cord, subsequent encounter: Secondary | ICD-10-CM | POA: Diagnosis not present

## 2021-07-31 DIAGNOSIS — S14124D Central cord syndrome at C4 level of cervical spinal cord, subsequent encounter: Secondary | ICD-10-CM | POA: Diagnosis not present

## 2021-07-31 DIAGNOSIS — M25611 Stiffness of right shoulder, not elsewhere classified: Secondary | ICD-10-CM | POA: Diagnosis not present

## 2021-07-31 DIAGNOSIS — M25511 Pain in right shoulder: Secondary | ICD-10-CM | POA: Diagnosis not present

## 2021-08-01 DIAGNOSIS — M25611 Stiffness of right shoulder, not elsewhere classified: Secondary | ICD-10-CM | POA: Diagnosis not present

## 2021-08-01 DIAGNOSIS — S14124D Central cord syndrome at C4 level of cervical spinal cord, subsequent encounter: Secondary | ICD-10-CM | POA: Diagnosis not present

## 2021-08-01 DIAGNOSIS — M25511 Pain in right shoulder: Secondary | ICD-10-CM | POA: Diagnosis not present

## 2021-08-04 DIAGNOSIS — M25611 Stiffness of right shoulder, not elsewhere classified: Secondary | ICD-10-CM | POA: Diagnosis not present

## 2021-08-04 DIAGNOSIS — M25511 Pain in right shoulder: Secondary | ICD-10-CM | POA: Diagnosis not present

## 2021-08-04 DIAGNOSIS — S14124D Central cord syndrome at C4 level of cervical spinal cord, subsequent encounter: Secondary | ICD-10-CM | POA: Diagnosis not present

## 2021-08-05 DIAGNOSIS — M25611 Stiffness of right shoulder, not elsewhere classified: Secondary | ICD-10-CM | POA: Diagnosis not present

## 2021-08-05 DIAGNOSIS — M25511 Pain in right shoulder: Secondary | ICD-10-CM | POA: Diagnosis not present

## 2021-08-05 DIAGNOSIS — S14124D Central cord syndrome at C4 level of cervical spinal cord, subsequent encounter: Secondary | ICD-10-CM | POA: Diagnosis not present

## 2021-08-06 DIAGNOSIS — S14124D Central cord syndrome at C4 level of cervical spinal cord, subsequent encounter: Secondary | ICD-10-CM | POA: Diagnosis not present

## 2021-08-06 DIAGNOSIS — M25611 Stiffness of right shoulder, not elsewhere classified: Secondary | ICD-10-CM | POA: Diagnosis not present

## 2021-08-06 DIAGNOSIS — M25511 Pain in right shoulder: Secondary | ICD-10-CM | POA: Diagnosis not present

## 2021-08-07 DIAGNOSIS — M25611 Stiffness of right shoulder, not elsewhere classified: Secondary | ICD-10-CM | POA: Diagnosis not present

## 2021-08-07 DIAGNOSIS — M25511 Pain in right shoulder: Secondary | ICD-10-CM | POA: Diagnosis not present

## 2021-08-07 DIAGNOSIS — S14124D Central cord syndrome at C4 level of cervical spinal cord, subsequent encounter: Secondary | ICD-10-CM | POA: Diagnosis not present

## 2021-08-08 DIAGNOSIS — S14124D Central cord syndrome at C4 level of cervical spinal cord, subsequent encounter: Secondary | ICD-10-CM | POA: Diagnosis not present

## 2021-08-08 DIAGNOSIS — M25511 Pain in right shoulder: Secondary | ICD-10-CM | POA: Diagnosis not present

## 2021-08-08 DIAGNOSIS — M25611 Stiffness of right shoulder, not elsewhere classified: Secondary | ICD-10-CM | POA: Diagnosis not present

## 2021-08-11 ENCOUNTER — Ambulatory Visit (INDEPENDENT_AMBULATORY_CARE_PROVIDER_SITE_OTHER): Payer: Medicare HMO | Admitting: Urology

## 2021-08-11 VITALS — BP 112/75 | HR 73

## 2021-08-11 DIAGNOSIS — M25611 Stiffness of right shoulder, not elsewhere classified: Secondary | ICD-10-CM | POA: Diagnosis not present

## 2021-08-11 DIAGNOSIS — R339 Retention of urine, unspecified: Secondary | ICD-10-CM | POA: Diagnosis not present

## 2021-08-11 DIAGNOSIS — M25511 Pain in right shoulder: Secondary | ICD-10-CM | POA: Diagnosis not present

## 2021-08-11 DIAGNOSIS — S14124D Central cord syndrome at C4 level of cervical spinal cord, subsequent encounter: Secondary | ICD-10-CM | POA: Diagnosis not present

## 2021-08-11 NOTE — Progress Notes (Signed)
Urological Symptom Review  Patient is experiencing the following symptoms: Patient has indwelling catheter   Review of Systems  Gastrointestinal (upper)  : Negative for upper GI symptoms  Gastrointestinal (lower) : Negative for lower GI symptoms  Constitutional : Negative for symptoms  Skin: Negative for skin symptoms  Eyes: Negative for eye symptoms  Ear/Nose/Throat : Negative for Ear/Nose/Throat symptoms  Hematologic/Lymphatic: Negative for Hematologic/Lymphatic symptoms  Cardiovascular : Negative for cardiovascular symptoms  Respiratory : Negative for respiratory symptoms  Endocrine: Negative for endocrine symptoms  Musculoskeletal: negative  Neurological: Negative for neurological symptoms  Psychologic: Negative for psychiatric symptoms

## 2021-08-12 ENCOUNTER — Encounter: Payer: Self-pay | Admitting: Urology

## 2021-08-12 ENCOUNTER — Other Ambulatory Visit: Payer: Self-pay

## 2021-08-12 DIAGNOSIS — R339 Retention of urine, unspecified: Secondary | ICD-10-CM

## 2021-08-12 DIAGNOSIS — M25511 Pain in right shoulder: Secondary | ICD-10-CM | POA: Diagnosis not present

## 2021-08-12 DIAGNOSIS — M25611 Stiffness of right shoulder, not elsewhere classified: Secondary | ICD-10-CM | POA: Diagnosis not present

## 2021-08-12 DIAGNOSIS — S14124D Central cord syndrome at C4 level of cervical spinal cord, subsequent encounter: Secondary | ICD-10-CM | POA: Diagnosis not present

## 2021-08-12 MED ORDER — AMBULATORY NON FORMULARY MEDICATION: Status: AC

## 2021-08-12 NOTE — Patient Instructions (Signed)
Indwelling Urinary Catheter Care, Adult An indwelling urinary catheter is a thin, flexible, germ-free (sterile) tube that is placed into the bladder to help drain urine out of the body. The catheter is inserted into the part of the body that drains urine from the bladder (urethra). Urine drains from the catheter into a drainage bag outside of the body. Taking good care of your catheter will keep it working properly and help to prevent problems from developing. What are the risks? Bacteria may get into your bladder and cause a urinary tract infection. Urine flow can become blocked. This can happen if the catheter is not working correctly, or if you have sediment or a blood clot in your bladder or the catheter. Tissue near the catheter may become irritated and bleed. How to wear your catheter and your drainage bag Supplies needed Adhesive tape or a leg strap. Alcohol wipe or soap and water (if you use tape). A clean towel (if you use tape). Overnight drainage bag. Smaller drainage bag (leg bag). Wearing your catheter and bag Use adhesive tape or a leg strap to attach your catheter to your leg. Make sure the catheter is not pulled tight. If a leg strap gets wet, replace it with a dry one. If you use adhesive tape: Use an alcohol wipe or soap and water to wash off any stickiness on your skin where you had tape before. Use a clean towel to pat-dry the area. Apply the new tape. You should have received a large overnight drainage bag and a smaller leg bag that fits underneath clothing. You may wear the overnight bag at any time, but you should not wear the leg bag at night. Always wear the leg bag below your knee. Make sure the overnight drainage bag is always lower than the level of your bladder, but do not let it touch the floor. Before you go to sleep, hang the bag inside a wastebasket that is covered by a clean plastic bag. How to care for your skin around the catheter   Supplies needed A  clean washcloth. Water and mild soap. A clean towel. Caring for your skin and catheter Every day, use a clean washcloth and soapy water to clean the skin around your catheter. Wash your hands with soap and water. Wet a washcloth in warm water and mild soap. Clean the skin around your urethra. If you are female: Use one hand to gently spread the folds of skin around your vagina (labia). With the washcloth in your other hand, wipe the inner side of your labia on each side. Do this in a front-to-back direction. If you are female: Use one hand to pull back any skin that covers the end of your penis (foreskin). With the washcloth in your other hand, wipe your penis in small circles. Start wiping at the tip of your penis, then move outward from the catheter. Move the foreskin back in place, if this applies. With your free hand, hold the catheter close to where it enters your body. Keep holding the catheter during cleaning so it does not get pulled out. Use your other hand to clean the catheter with the washcloth. Only wipe downward on the catheter, toward the bag. Do not wipe upward toward your body, because that may push bacteria into your urethra and cause infection. Use a clean towel to pat-dry the catheter and the skin around it. Make sure to wipe off all soap. Wash your hands with soap and water. Shower every day. Do  not take baths. Do not use cream, ointment, or lotion on the area where the catheter enters your body, unless your health care provider tells you to do that. Do not use powders, sprays, or lotions on your genital area. Check your skin around the catheter every day for signs of infection. Check for: Redness, swelling, or pain. Fluid or blood. Warmth. Pus or a bad smell. How to empty the drainage bag Supplies needed Rubbing alcohol. Gauze pad or cotton ball. Adhesive tape or a leg strap. Emptying the bag Empty your drainage bag (your overnight drainage bag or your leg bag)  when it is ?- full, or at least 2-3 times a day. Clean the drainage bag according to the manufacturer's instructions or as told by your health care provider. Wash your hands with soap and water. Detach the drainage bag from your leg. Hold the drainage bag over the toilet or a clean container. Make sure the drainage bag is lower than your hips and bladder. This stops urine from going back into the tubing and into your bladder. Open the pour spout at the bottom of the bag. Empty the urine into the toilet or container. Do not let the pour spout touch any surface. This precaution is important to prevent bacteria from getting in the bag and causing infection. Apply rubbing alcohol to a gauze pad or cotton ball. Use the gauze pad or cotton ball to clean the pour spout. Close the pour spout. Attach the bag to your leg with adhesive tape or a leg strap. Wash your hands with soap and water. How to change the drainage bag Supplies needed: Alcohol wipes. A clean drainage bag. Adhesive tape or a leg strap. Changing the bag Replace your drainage bag with a clean bag if it leaks, starts to smell bad, or looks dirty. Wash your hands with soap and water. Detach the dirty drainage bag from your leg. Pinch the catheter with your fingers so that urine does not spill out. Disconnect the catheter tube from the drainage tube at the connection valve. Do not let the tubes touch any surface. Clean the end of the catheter tube with an alcohol wipe. Use a different alcohol wipe to clean the end of the drainage tube. Connect the catheter tube to the drainage tube of the clean bag. Attach the clean bag to your leg with adhesive tape or a leg strap. Avoid attaching the new bag too tightly. Wash your hands with soap and water. General instructions  Never pull on your catheter or try to remove it. Pulling can damage your internal tissues. Always wash your hands before and after you handle your catheter or drainage  bag. Use a mild, fragrance-free soap. If soap and water are not available, use hand sanitizer. Always make sure there are no twists or bends (kinks) in the catheter tube. Always make sure there are no leaks in the catheter or drainage bag. Drink enough fluid to keep your urine pale yellow. Do not take baths, swim, or use a hot tub. If you are female, wipe from front to back after having a bowel movement. Contact a health care provider if: Your urine is cloudy. Your urine smells unusually bad. Your catheter gets clogged. Your catheter starts to leak. Your bladder feels full. Get help right away if: You have redness, swelling, or pain where the catheter enters your body. You have fluid, blood, pus, or a bad smell coming from the area where the catheter enters your body. The area where  the catheter enters your body feels warm to the touch. You have a fever. You have pain in your abdomen, legs, lower back, or bladder. You see blood in the catheter. Your urine is pink or red. You have nausea, vomiting, or chills. Your urine is not draining into the bag. Your catheter gets pulled out. Summary An indwelling urinary catheter is a thin, flexible, germ-free (sterile) tube that is placed into the bladder to help drain urine out of the body. The catheter is inserted into the part of the body that drains urine from the bladder (urethra). Take good care of your catheter to keep it working properly and help prevent problems from developing. Always wash your hands before and after you handle your catheter or drainage bag. Never pull on your catheter or try to remove it. This information is not intended to replace advice given to you by your health care provider. Make sure you discuss any questions you have with your health care provider. Document Revised: 11/06/2020 Document Reviewed: 08/02/2020 Elsevier Patient Education  2022 [employer].

## 2021-08-12 NOTE — Progress Notes (Signed)
08/11/2021 9:46 AM   Ileana Roup [birth year] [id number]  Referring provider: Curlene Labrum, MD [place],  [place] [id number]  Followup urinary retention   HPI: [name] is a 85yo here for followup for urinary retention. She remains confined to a wheelchair. She has a chronic indwelling foley which is changed monthly. She denies any pelvic or suprapubic pain. No UTIs since last visit. No hematuria. No other complaints today   PMH: Past Medical History:  Diagnosis Date   DM type 2 with diabetic peripheral neuropathy ([place])    High cholesterol    Neck pain    Neuropathy     Surgical History: Past Surgical History:  Procedure Laterality Date   ABDOMINAL HYSTERECTOMY     ANTERIOR CERVICAL DECOMP/DISCECTOMY FUSION N/A 02/05/2021   Procedure: Cervical Three-Four  Anterior cervical decompression/discectomy/fusion;  Surgeon: Ashok Pall, MD;  Location: [facility];  Service: Neurosurgery;  Laterality: N/A;  RM 20   APPENDECTOMY     BACK SURGERY     CERVICAL DISC SURGERY     HAND SURGERY     KNEE SURGERY      Home Medications:  Allergies as of 08/11/2021       Reactions   Codeine    Sulfa Antibiotics         Medication List      Notice   This visit is during an admission. Changes to the med list made in this visit will be reflected in the After Visit Summary of the admission.     Allergies:  Allergies  Allergen Reactions   Codeine    Sulfa Antibiotics     Family History: Family History  Problem Relation Age of Onset   Cardiomyopathy Father    Cancer Mother     Social History:  reports that she has never smoked. She has never used smokeless tobacco. She reports that she does not drink alcohol and does not use drugs.  ROS: All other review of systems were reviewed and are negative except what is noted above in HPI  Physical Exam: BP 112/75    Pulse 73   Constitutional:  Alert and oriented, No acute distress. HEENT: [place] AT, moist mucus  membranes.  Trachea midline, no masses. Cardiovascular: No clubbing, cyanosis, or edema. Respiratory: Normal respiratory effort, no increased work of breathing. GI: Abdomen is soft, nontender, nondistended, no abdominal masses GU: No CVA tenderness.  Lymph: No cervical or inguinal lymphadenopathy. Skin: No rashes, bruises or suspicious lesions. Neurologic: Grossly intact, no focal deficits, moving all 4 extremities. Psychiatric: Normal mood and affect.  Laboratory Data: Lab Results  Component Value Date   WBC 5.6 07/09/2021   HGB 8.7 (L) 07/09/2021   HCT 26.8 (L) 07/09/2021   MCV 91.5 07/09/2021   PLT 341 07/09/2021    Lab Results  Component Value Date   CREATININE 0.88 07/10/2021    No results found for: PSA  No results found for: TESTOSTERONE  Lab Results  Component Value Date   HGBA1C 8.3 (H) 07/06/2021    Urinalysis    Component Value Date/Time   COLORURINE YELLOW 07/06/2021 1615   APPEARANCEUR HAZY (A) 07/06/2021 1615   LABSPEC 1.015 07/06/2021 1615   PHURINE 7.0 07/06/2021 1615   [place] 07/06/2021 1615   HGBUR NEGATIVE 07/06/2021 1615   BILIRUBINUR NEGATIVE 07/06/2021 [place] 07/06/2021 1615   PROTEINUR 30 (A) 07/06/2021 1615   NITRITE NEGATIVE 07/06/2021 1615   LEUKOCYTESUR LARGE (A) 07/06/2021  1615    Lab Results  Component Value Date   BACTERIA NONE SEEN 07/06/2021    Pertinent Imaging:  No results found for this or any previous visit.  No results found for this or any previous visit.  No results found for this or any previous visit.  No results found for this or any previous visit.  Results for orders placed during the hospital encounter of 01/26/21  US RENAL  Narrative CLINICAL DATA:  Acute kidney injury.  EXAM: RENAL / URINARY TRACT ULTRASOUND COMPLETE  COMPARISON:  CT abdomen pelvis dated Jan 26, 2021.  FINDINGS: Right Kidney:  Renal measurements: 10.6 x 5.1 x 5.6 cm = volume: 157  mL. Echogenicity within normal limits. No mass or hydronephrosis visualized. Small amount of perinephric fluid.  Left Kidney:  Renal measurements: 10.4 x 6.2 x 7.0 cm = volume: 232 mL. Echogenicity within normal limits. No mass or hydronephrosis visualized.  Bladder:  Decompressed by Foley catheter.  Other:  None.  IMPRESSION: 1. No acute abnormality. Small amount of right perinephric fluid, nonspecific.   Electronically Signed By: Titus Dubin M.D. On: 01/30/2021 11:15  No results found for this or any previous visit.  No results found for this or any previous visit.  No results found for this or any previous visit.   Assessment & Plan:    1. Urinary retention Continue monthly foley changes with 16 french foley -RTC 1 year   No follow-ups on file.  Nicolette Bang, MD  [facility] [place]

## 2021-08-13 DIAGNOSIS — M25511 Pain in right shoulder: Secondary | ICD-10-CM | POA: Diagnosis not present

## 2021-08-13 DIAGNOSIS — S14124D Central cord syndrome at C4 level of cervical spinal cord, subsequent encounter: Secondary | ICD-10-CM | POA: Diagnosis not present

## 2021-08-13 DIAGNOSIS — M25611 Stiffness of right shoulder, not elsewhere classified: Secondary | ICD-10-CM | POA: Diagnosis not present

## 2021-08-27 ENCOUNTER — Ambulatory Visit: Payer: Medicare HMO | Admitting: Internal Medicine

## 2021-08-27 ENCOUNTER — Encounter: Payer: Self-pay | Admitting: Gastroenterology

## 2021-08-27 NOTE — Progress Notes (Signed)
Referring Provider: Gerlene Fee, NP Primary Care Physician:  Gerlene Fee, NP Primary Gastroenterologist:  Dr. Abbey Chatters  Chief Complaint  Patient presents with   poor appetitie    Can eat breakfast but not much appetite for other meals   Nausea    Better, did have some vomiting too    HPI:   [name] is a 85 y.o. female presenting today at the request of Gwendolyn Edwards, NP for nausea, vomiting, and upset stomach. Original referral sent on 07/03/21.   Today, patient presents with her daughter [name] who helps provide history though patient is A&O x 4 and is able to provide essentially all history herself.   Patient reports about 3 months ago, she started having nausea, couldn't eat, some intermittent vomiting. Started out of the blue. No chronic issues with nausea/vomiting. Denies associated abdominal pain. States she was hositalized in November with Bells Palsey, UTI, and ?Seizure and has been doing much better since that time. Nausea and vomiting have completely resolved, but she doesn't have an appetite. This started at the same time as nausea/vomiting. She eats a good breakfast, but doesn't eat much lunch or dinner as she feels full.  Initially, she attributes this to not liking the food served at [employer]; however, her daughter reports that she also brings her food that she ask for and she does not eat very much.   Chronic history of reflux which is well controlled with omeprazole 40 mg daily.  Denies dysphagia.  No prior EGD that she can remember.  No known history of PUD or H. pylori.  She reports having weight loss as her clothes feel looser than they used to though she cannot quantify this.   Per chart review, it appears her baseline weight was in the mid 160s.  She weighed 165 lbs in May 2022.  Down to 153 lbs in June, 150 lbs in August, 159 in October, 153 November, and 152 lbs today per patient.    She is not sure how often she has a bowel movement as she  is incontinent.  She is also unaware if she has had any bright red blood per rectum or melena.  Denies hematuria.  She has a catheter in place.  TCS in the 80s. No history of polyps.  Not interested in having any major surgeries.  Does not want to have a colonoscopy unless absolutely necessary.  Reports she fell down stairs in May and injured her neck and has not been able to walk since that time.  Prior to this, she was taking care of her self.  Since then, she has been in a nursing facility.  States she can barely feed herself as mobility in her arms is limited.  She has a lot of weakness.  She can move her legs though mobility is very limited.  Reviewed CT A/P with contrast completed during hospitalization in November which showed possible pyelonephritis, mild bladder wall thickening with fat stranding, improvement in bilateral hydronephrosis, stool distending the rectum at 7.6 cm with mild distal rectal wall thickening, suspicious for fecal impaction, possible slight hepatic capsular nodularity, stable peripheral cyst in the liver, normal spleen.   She denies any known history of liver disease. No family history. Denies history of alcohol use or drug use. Reports her abdeomen seems a little bigger since her fall in May. Denies LE edema, confusion/mental status changes, easy bruising/bleeding.   Spoke with patient's nurse at [facility] who states patient has been  doing very well since she returned to the nursing facility after her hospitalization in November.  She is started on Reglan which seems to have resolved her nausea and vomiting.  She eats about 50% of her meals.  States her weight at the nursing facility has been stable over the last couple of months.  She did have some weight loss around October/ early November when she was having nausea and vomiting.  She has been moving her bowels very well, daily to every other day.  She had been having some trouble with constipation previously, but this  has resolved.  No BRBPR or melena.   Past Medical History:  Diagnosis Date   Anemia    CKD (chronic kidney disease)    Depression    DM type 2 with diabetic peripheral neuropathy (HCC)    GERD (gastroesophageal reflux disease)    High cholesterol    HTN (hypertension)    Hyponatremia    Neck pain    Neuropathy    Quadriplegia, C1-C4 incomplete ([place])    Urinary retention     Past Surgical History:  Procedure Laterality Date   ABDOMINAL HYSTERECTOMY     ANTERIOR CERVICAL DECOMP/DISCECTOMY FUSION N/A 02/05/2021   Procedure: Cervical Three-Four  Anterior cervical decompression/discectomy/fusion;  Surgeon: Ashok Pall, MD;  Location: [facility];  Service: Neurosurgery;  Laterality: N/A;  RM [place] SURGERY     HAND SURGERY     KNEE SURGERY     Outpatient Encounter Medications as of 08/28/2021  Medication Sig   acetaminophen (TYLENOL) 325 MG tablet Take 650 mg by mouth every 8 (eight) hours.   albuterol (VENTOLIN HFA) 108 (90 Base) MCG/ACT inhaler Inhale 1 puff into the lungs every 4 (four) hours as needed for wheezing or shortness of breath.   Artificial Saliva (BIOTENE MOISTURIZING MOUTH MT) Use as directed 1 application in the mouth or throat at bedtime. 9 pm   aspirin EC 81 MG tablet Take 81 mg by mouth daily. Swallow whole.9 am   atorvastatin (LIPITOR) 20 MG tablet Take 1 tablet (20 mg total) by mouth every evening.   Balsam [place]-Castor Oil (VENELEX) OINT Apply topically. Apply to sacrum, coccyx, bilateral buttocks qshift for prevention.   chlorthalidone (HYGROTON) 25 MG tablet Take 25 mg by mouth daily.   colchicine 0.6 MG tablet Take 0.6 mg by mouth daily as needed.   DULoxetine (CYMBALTA) 60 MG capsule Take 60 mg by mouth daily.   gabapentin (NEURONTIN) 100 MG capsule Take 1 capsule (100 mg total) by mouth 2 (two) times daily.   guaiFENesin (MUCINEX) 600 MG 12 hr tablet Take by mouth 2 (two) times daily.   insulin glargine (LANTUS)  100 UNIT/ML injection Inject 20 Units into the skin at bedtime.   insulin lispro (HUMALOG) 100 UNIT/ML KwikPen Inject 5 Units into the skin 2 (two) times daily with a meal.   levETIRAcetam (KEPPRA) 750 MG tablet Take 750 mg by mouth 2 (two) times daily.   melatonin 3 MG TABS tablet Take 3 mg by mouth at bedtime.   metoCLOPramide (REGLAN) 5 MG tablet Take 2.5 mg by mouth 4 (four) times daily.   mirtazapine (REMERON) 7.5 MG tablet Take 15 mg by mouth at bedtime.   omeprazole (PRILOSEC) 40 MG capsule Take 40 mg by mouth daily.   rOPINIRole (REQUIP) 1 MG tablet Take 1 mg by mouth at bedtime.   tiZANidine (ZANAFLEX) 2 MG tablet Take  2 mg by mouth every 6 (six) hours as needed for muscle spasms (for back and sciatic pain).   [DISCONTINUED] lisinopril (ZESTRIL) 2.5 MG tablet Take 2.5 mg by mouth daily.   AMBULATORY NON FORMULARY MEDICATION Medication Name:  Continue monthly catheter changes with 6fr catheter at skilled nursing facility. (Patient taking differently: Medication Name:  Continue monthly catheter changes with 60fr catheter at skilled nursing facility.)   NON FORMULARY Diet - NAS, Cons CHO   [DISCONTINUED] prochlorperazine (COMPAZINE) 10 MG tablet Take 10 mg by mouth every 6 (six) hours as needed for nausea or vomiting. (Patient not taking: Reported on 08/28/2021)   No facility-administered encounter medications on file as of 08/28/2021.     Current Outpatient Medications  Medication Sig Dispense Refill   AMBULATORY NON FORMULARY MEDICATION Medication Name:  Continue monthly catheter changes with 46fr catheter at skilled nursing facility. (Patient taking differently: Medication Name:  Continue monthly catheter changes with 52fr catheter at skilled nursing facility.) 1 application MONTHLY   No current facility-administered medications for this visit.    Allergies as of 08/28/2021 - Review Complete 08/28/2021  Allergen Reaction Noted   Codeine  03/09/2018   Sulfa antibiotics   03/25/2021    Family History  Problem Relation Age of Onset   Cancer Mother    Cardiomyopathy Father    Colon cancer Neg Hx     Social History   Socioeconomic History   Marital status: Widowed    Spouse name: Not on file   Number of children: Not on file   Years of education: Not on file   Highest education level: Not on file  Occupational History   Not on file  Tobacco Use   Smoking status: Never   Smokeless tobacco: Never  Vaping Use   Vaping Use: Never used  Substance and Sexual Activity   Alcohol use: Never   Drug use: Never   Sexual activity: Never  Other Topics Concern   Not on file  Social History Narrative   Not on file   Social Determinants of Health   Financial Resource Strain: Not on file  Food Insecurity: Not on file  Transportation Needs: Not on file  Physical Activity: Not on file  Stress: Not on file  Social Connections: Not on file  Intimate Partner Violence: Not on file    Review of Systems: Gen: Denies any fever, chills, cold or flulike symptoms, presyncope, syncope.  Admits to postnasal drainage. CV: Denies chest pain, heart palpitations. Resp: Admits to intermittent cough and shortness of breath when talking a lot since her fall in May.  GI: See HPI GU : Has urinary catheter in place. MS: Admits to chronic arm pain, leg pain.  Derm: Denies rash. Psych: Denies depression, anxiety. Heme: See HPI.  Physical Exam: BP 98/67    Pulse 74    Temp (!) 96.8 F (36 C) (Temporal)    Ht 5\' 2"  (1.575 m)    Wt 152 lb (68.9 kg) Comment: unable to stand, stated by pt   BMI 27.80 kg/m  General:   Well-nourished and well-developed but frail-appearing, weak.  Alert and oriented. Pleasant and cooperative.  Head:  Normocephalic and atraumatic. Eyes:  Without icterus, sclera clear and conjunctiva pink.  Ears:  Normal auditory acuity. Lungs:  Clear to auscultation bilaterally. No wheezes, rales, or rhonchi. No distress.  Heart:  S1, S2 present without  murmurs appreciated.  Abdomen: Exam somewhat limited due to being performed in wheelchair.  +BS, soft, non-tender and non-distended.  No guarding or rebound. No masses appreciated.  Rectal:  Deferred  GU: Urinary catheter in place with pale yellow urine in collecting bag.   Msk:  Symmetrical without gross deformities. Normal posture. Extremities:  Without edema.  Limited mobility in all 4 extremities. Neurologic:  Alert and  oriented x4.  Skin:  Intact without significant lesions or rashes. Psych:  Alert and cooperative. Normal mood and affect.    Assessment: 85 year old female with history of anemia, CKD, T2DM, HTN, HLD, central cord syndrome at the level of C4 following fall in May 2022, now with very limited mobility of all 4 extremities and residing at [facility], presenting today at the request of Gwendolyn Edwards, NP for nausea and vomiting.    Nausea/vomiting/early satiety: New onset nausea, intermittent vomiting, and poor appetite/early satiety about 3 months ago without any chronic issues previously. Associated weight loss though difficult to quantify as weight loss actually started following her fall in May. Denies associated abdominal pain, BRBPR, or melena.  Chronically on omeprazole which keeps GERD well controlled and denies dysphagia.  No NSAIDs aside from 81 mg aspirin daily. Notably, symptoms now improved with resolution of nausea and vomiting, and weight stabilized following hospitalization in early November with Bell's palsy, likely seizure (started on Keppra), and found to have UTI, ? Pyelonephritis on CT A/P with contrast.  Also with possible fecal impaction on that CT as well.  Patient denies constipation now, and I spoke with her nurse at [facility] who reported patient is having good BMs daily to every other day. Suspect UTI and constipation were likely contributing to her symptoms. She does continue with early satiety/poor appetite.  Eats breakfast well, but eating  little lunch and dinner despite her daughter bringing her food that she likes.  Notably, she was also started on Reglan during her hospitalization, but no gastric emptying study on file.  She could have a component of gastroparesis in the setting of diabetes; however, she should have an upper endoscopy to evaluate for gastric outlet obstruction, malignancy, PUD, gastritis, duodenitis.    Anemia: Per chart review, patient has chronic normocytic anemia with hemoglobin in the 10-11 range dating back to 2009.  She has been in the 8-9 range since June 2022 (neck surgery in June).  Vitamin B12 and folate were within normal limits in December 2020.  No iron panel on file.  She has no overt GI bleeding, hematuria, or any other obvious blood loss.  Reports last colonoscopy was in the 80s and denies history of polyps. No family history of colon cancer. CT A/P with contrast in November with mild distal rectal wall thickening though this was in the setting of constipation with stool distending the rectum at 7.6 cm. Constipation now resolved. Patient is not interested in having another colonoscopy unless absolutely necssary. No prior EGD. We will plan to update CBC, B12, folate, and check an iron panel. If evidence if IDA, would need to consider EGD and TCS.  Otherwise, suspect anemia of chronic disease.  Abnormal CT Liver:  CT A/P with contrast 07/06/2021 with possible slight hepatic capsular nodularity, stable peripheral hepatic cyst, normal spleen.  Prior CT imaging in May and October with no mention of liver nodularity.  Patient denies any known history of liver disease.  No family history of liver disease.  Denies history of alcohol or drug use.  AST and ALT were mildly elevated in the 60s and platelets mildly low at 132 during hospitalization in June; however, LFTs and platelets  returned to normal thereafter. She does have history of diabetes, HTN, HLD, obesity, putting her at risk for fatty liver disease. Patient  reports that her abdomen seems larger since her fall in May, but on exam today, her abdomen is soft. No signs/symptoms of advanced liver disease. Will recheck labs and abdominal US with elastography to evaluate for possible cirrhosis.    Plan:  CBC, CMP, INR, vitamin B12, folate, iron panel with ferritin Ultrasound abdomen complete with elastography. Needs EGD with propofol with Dr. Abbey Chatters for further evaluation of early satiety and weight loss.  If evidence of IDA, would need to consider colonoscopy as well. ASA 4 4-6 small meals daily. Add 1-2 protein/nutritional daily to help maintain nutrition and weight. Further recommendations following labs and [place] result. Patient would like [place] to call her daughter, [name], or her grand daughter [name], with results and recommendations.     Aliene Altes, PA-C [facility] 08/28/2021

## 2021-08-27 NOTE — H&P (View-Only) (Signed)
Referring Provider: Gerlene Fee, NP Primary Care Physician:  Gwendolyn Fee, NP Primary Gastroenterologist:  Dr. Abbey Fernandez  Chief Complaint  Patient presents with   poor appetitie    Can eat breakfast but not much appetite for other meals   Nausea    Better, did have some vomiting too    HPI:   [name] is a 85 y.o. female presenting today at the request of Gwendolyn Edwards, NP for nausea, vomiting, and upset stomach. Original referral sent on 07/03/21.   Today, patient presents with her daughter [name] who helps provide history though patient is A&O x 4 and is able to provide essentially all history herself.   Patient reports about 3 months ago, she started having nausea, couldn't eat, some intermittent vomiting. Started out of the blue. No chronic issues with nausea/vomiting. Denies associated abdominal pain. States she was hositalized in November with Bells Palsey, UTI, and ?Seizure and has been doing much better since that time. Nausea and vomiting have completely resolved, but she doesn't have an appetite. This started at the same time as nausea/vomiting. She eats a good breakfast, but doesn't eat much lunch or dinner as she feels full.  Initially, she attributes this to not liking the food served at [employer]; however, her daughter reports that she also brings her food that she ask for and she does not eat very much.   Chronic history of reflux which is well controlled with omeprazole 40 mg daily.  Denies dysphagia.  No prior EGD that she can remember.  No known history of PUD or H. pylori.  She reports having weight loss as her clothes feel looser than they used to though she cannot quantify this.   Per chart review, it appears her baseline weight was in the mid 160s.  She weighed 165 lbs in May 2022.  Down to 153 lbs in June, 150 lbs in August, 159 in October, 153 November, and 152 lbs today per patient.    She is not sure how often she has a bowel movement as she  is incontinent.  She is also unaware if she has had any bright red blood per rectum or melena.  Denies hematuria.  She has a catheter in place.  TCS in the 80s. No history of polyps.  Not interested in having any major surgeries.  Does not want to have a colonoscopy unless absolutely necessary.  Reports she fell down stairs in May and injured her neck and has not been able to walk since that time.  Prior to this, she was taking care of her self.  Since then, she has been in a nursing facility.  States she can barely feed herself as mobility in her arms is limited.  She has a lot of weakness.  She can move her legs though mobility is very limited.  Reviewed CT A/P with contrast completed during hospitalization in November which showed possible pyelonephritis, mild bladder wall thickening with fat stranding, improvement in bilateral hydronephrosis, stool distending the rectum at 7.6 cm with mild distal rectal wall thickening, suspicious for fecal impaction, possible slight hepatic capsular nodularity, stable peripheral cyst in the liver, normal spleen.   She denies any known history of liver disease. No family history. Denies history of alcohol use or drug use. Reports her abdeomen seems a little bigger since her fall in May. Denies LE edema, confusion/mental status changes, easy bruising/bleeding.   Spoke with patient's nurse at [facility] who states patient has been  doing very well since she returned to the nursing facility after her hospitalization in November.  She is started on Reglan which seems to have resolved her nausea and vomiting.  She eats about 50% of her meals.  States her weight at the nursing facility has been stable over the last couple of months.  She did have some weight loss around October/ early November when she was having nausea and vomiting.  She has been moving her bowels very well, daily to every other day.  She had been having some trouble with constipation previously, but this  has resolved.  No BRBPR or melena.   Past Medical History:  Diagnosis Date   Anemia    CKD (chronic kidney disease)    Depression    DM type 2 with diabetic peripheral neuropathy (HCC)    GERD (gastroesophageal reflux disease)    High cholesterol    HTN (hypertension)    Hyponatremia    Neck pain    Neuropathy    Quadriplegia, C1-C4 incomplete ([place])    Urinary retention     Past Surgical History:  Procedure Laterality Date   ABDOMINAL HYSTERECTOMY     ANTERIOR CERVICAL DECOMP/DISCECTOMY FUSION N/A 02/05/2021   Procedure: Cervical Three-Four  Anterior cervical decompression/discectomy/fusion;  Surgeon: Ashok Pall, MD;  Location: [place];  Service: Neurosurgery;  Laterality: N/A;  [facility] [place] SURGERY     HAND SURGERY     KNEE SURGERY     Outpatient Encounter Medications as of 08/28/2021  Medication Sig   acetaminophen (TYLENOL) 325 MG tablet Take 650 mg by mouth every 8 (eight) hours.   albuterol (VENTOLIN HFA) 108 (90 Base) MCG/ACT inhaler Inhale 1 puff into the lungs every 4 (four) hours as needed for wheezing or shortness of breath.   Artificial Saliva (BIOTENE MOISTURIZING MOUTH MT) Use as directed 1 application in the mouth or throat at bedtime. 9 pm   aspirin EC 81 MG tablet Take 81 mg by mouth daily. Swallow whole.9 am   atorvastatin (LIPITOR) 20 MG tablet Take 1 tablet (20 mg total) by mouth every evening.   Balsam [place]-Castor Oil (VENELEX) OINT Apply topically. Apply to sacrum, coccyx, bilateral buttocks qshift for prevention.   chlorthalidone (HYGROTON) 25 MG tablet Take 25 mg by mouth daily.   colchicine 0.6 MG tablet Take 0.6 mg by mouth daily as needed.   DULoxetine (CYMBALTA) 60 MG capsule Take 60 mg by mouth daily.   gabapentin (NEURONTIN) 100 MG capsule Take 1 capsule (100 mg total) by mouth 2 (two) times daily.   guaiFENesin (MUCINEX) 600 MG 12 hr tablet Take by mouth 2 (two) times daily.   insulin glargine (LANTUS)  100 UNIT/ML injection Inject 20 Units into the skin at bedtime.   insulin lispro (HUMALOG) 100 UNIT/ML KwikPen Inject 5 Units into the skin 2 (two) times daily with a meal.   levETIRAcetam (KEPPRA) 750 MG tablet Take 750 mg by mouth 2 (two) times daily.   melatonin 3 MG TABS tablet Take 3 mg by mouth at bedtime.   metoCLOPramide (REGLAN) 5 MG tablet Take 2.5 mg by mouth 4 (four) times daily.   mirtazapine (REMERON) 7.5 MG tablet Take 15 mg by mouth at bedtime.   omeprazole (PRILOSEC) 40 MG capsule Take 40 mg by mouth daily.   rOPINIRole (REQUIP) 1 MG tablet Take 1 mg by mouth at bedtime.   tiZANidine (ZANAFLEX) 2 MG tablet Take  2 mg by mouth every 6 (six) hours as needed for muscle spasms (for back and sciatic pain).   [DISCONTINUED] lisinopril (ZESTRIL) 2.5 MG tablet Take 2.5 mg by mouth daily.   AMBULATORY NON FORMULARY MEDICATION Medication Name:  Continue monthly catheter changes with 56fr catheter at skilled nursing facility. (Patient taking differently: Medication Name:  Continue monthly catheter changes with 55fr catheter at skilled nursing facility.)   NON FORMULARY Diet - NAS, Cons CHO   [DISCONTINUED] prochlorperazine (COMPAZINE) 10 MG tablet Take 10 mg by mouth every 6 (six) hours as needed for nausea or vomiting. (Patient not taking: Reported on 08/28/2021)   No facility-administered encounter medications on file as of 08/28/2021.     Current Outpatient Medications  Medication Sig Dispense Refill   AMBULATORY NON FORMULARY MEDICATION Medication Name:  Continue monthly catheter changes with 26fr catheter at skilled nursing facility. (Patient taking differently: Medication Name:  Continue monthly catheter changes with 67fr catheter at skilled nursing facility.) 1 application MONTHLY   No current facility-administered medications for this visit.    Allergies as of 08/28/2021 - Review Complete 08/28/2021  Allergen Reaction Noted   Codeine  03/09/2018   Sulfa antibiotics   03/25/2021    Family History  Problem Relation Age of Onset   Cancer Mother    Cardiomyopathy Father    Colon cancer Neg Hx     Social History   Socioeconomic History   Marital status: Widowed    Spouse name: Not on file   Number of children: Not on file   Years of education: Not on file   Highest education level: Not on file  Occupational History   Not on file  Tobacco Use   Smoking status: Never   Smokeless tobacco: Never  Vaping Use   Vaping Use: Never used  Substance and Sexual Activity   Alcohol use: Never   Drug use: Never   Sexual activity: Never  Other Topics Concern   Not on file  Social History Narrative   Not on file   Social Determinants of Health   Financial Resource Strain: Not on file  Food Insecurity: Not on file  Transportation Needs: Not on file  Physical Activity: Not on file  Stress: Not on file  Social Connections: Not on file  Intimate Partner Violence: Not on file    Review of Systems: Gen: Denies any fever, chills, cold or flulike symptoms, presyncope, syncope.  Admits to postnasal drainage. CV: Denies chest pain, heart palpitations. Resp: Admits to intermittent cough and shortness of breath when talking a lot since her fall in May.  GI: See HPI GU : Has urinary catheter in place. MS: Admits to chronic arm pain, leg pain.  Derm: Denies rash. Psych: Denies depression, anxiety. Heme: See HPI.  Physical Exam: BP 98/67    Pulse 74    Temp (!) 96.8 F (36 C) (Temporal)    Ht 5\' 2"  (1.575 m)    Wt 152 lb (68.9 kg) Comment: unable to stand, stated by pt   BMI 27.80 kg/m  General:   Well-nourished and well-developed but frail-appearing, weak.  Alert and oriented. Pleasant and cooperative.  Head:  Normocephalic and atraumatic. Eyes:  Without icterus, sclera clear and conjunctiva pink.  Ears:  Normal auditory acuity. Lungs:  Clear to auscultation bilaterally. No wheezes, rales, or rhonchi. No distress.  Heart:  S1, S2 present without  murmurs appreciated.  Abdomen: Exam somewhat limited due to being performed in wheelchair.  +BS, soft, non-tender and non-distended.  No guarding or rebound. No masses appreciated.  Rectal:  Deferred  GU: Urinary catheter in place with pale yellow urine in collecting bag.   Msk:  Symmetrical without gross deformities. Normal posture. Extremities:  Without edema.  Limited mobility in all 4 extremities. Neurologic:  Alert and  oriented x4.  Skin:  Intact without significant lesions or rashes. Psych:  Alert and cooperative. Normal mood and affect.    Assessment: 85 year old female with history of anemia, CKD, T2DM, HTN, HLD, central cord syndrome at the level of C4 following fall in May 2022, now with very limited mobility of all 4 extremities and residing at [facility], presenting today at the request of Gwendolyn Edwards, NP for nausea and vomiting.    Nausea/vomiting/early satiety: New onset nausea, intermittent vomiting, and poor appetite/early satiety about 3 months ago without any chronic issues previously. Associated weight loss though difficult to quantify as weight loss actually started following her fall in May. Denies associated abdominal pain, BRBPR, or melena.  Chronically on omeprazole which keeps GERD well controlled and denies dysphagia.  No NSAIDs aside from 81 mg aspirin daily. Notably, symptoms now improved with resolution of nausea and vomiting, and weight stabilized following hospitalization in early November with Bell's palsy, likely seizure (started on Keppra), and found to have UTI, ? Pyelonephritis on CT A/P with contrast.  Also with possible fecal impaction on that CT as well.  Patient denies constipation now, and I spoke with her nurse at [facility] who reported patient is having good BMs daily to every other day. Suspect UTI and constipation were likely contributing to her symptoms. She does continue with early satiety/poor appetite.  Eats breakfast well, but eating  little lunch and dinner despite her daughter bringing her food that she likes.  Notably, she was also started on Reglan during her hospitalization, but no gastric emptying study on file.  She could have a component of gastroparesis in the setting of diabetes; however, she should have an upper endoscopy to evaluate for gastric outlet obstruction, malignancy, PUD, gastritis, duodenitis.    Anemia: Per chart review, patient has chronic normocytic anemia with hemoglobin in the 10-11 range dating back to 2009.  She has been in the 8-9 range since June 2022 (neck surgery in June).  Vitamin B12 and folate were within normal limits in December 2020.  No iron panel on file.  She has no overt GI bleeding, hematuria, or any other obvious blood loss.  Reports last colonoscopy was in the 80s and denies history of polyps. No family history of colon cancer. CT A/P with contrast in November with mild distal rectal wall thickening though this was in the setting of constipation with stool distending the rectum at 7.6 cm. Constipation now resolved. Patient is not interested in having another colonoscopy unless absolutely necssary. No prior EGD. We will plan to update CBC, B12, folate, and check an iron panel. If evidence if IDA, would need to consider EGD and TCS.  Otherwise, suspect anemia of chronic disease.  Abnormal CT Liver:  CT A/P with contrast 07/06/2021 with possible slight hepatic capsular nodularity, stable peripheral hepatic cyst, normal spleen.  Prior CT imaging in May and October with no mention of liver nodularity.  Patient denies any known history of liver disease.  No family history of liver disease.  Denies history of alcohol or drug use.  AST and ALT were mildly elevated in the 60s and platelets mildly low at 132 during hospitalization in June; however, LFTs and platelets  returned to normal thereafter. She does have history of diabetes, HTN, HLD, obesity, putting her at risk for fatty liver disease. Patient  reports that her abdomen seems larger since her fall in May, but on exam today, her abdomen is soft. No signs/symptoms of advanced liver disease. Will recheck labs and abdominal US with elastography to evaluate for possible cirrhosis.    Plan:  CBC, CMP, INR, vitamin B12, folate, iron panel with ferritin Ultrasound abdomen complete with elastography. Needs EGD with propofol with Dr. Abbey Fernandez for further evaluation of early satiety and weight loss.  If evidence of IDA, would need to consider colonoscopy as well. ASA 4 4-6 small meals daily. Add 1-2 protein/nutritional daily to help maintain nutrition and weight. Further recommendations following labs and [place] result. Patient would like [place] to call her daughter, [name], or her grand daughter Modena Slater, with results and recommendations.     Aliene Altes, PA-C [facility] 08/28/2021

## 2021-08-28 ENCOUNTER — Ambulatory Visit (INDEPENDENT_AMBULATORY_CARE_PROVIDER_SITE_OTHER): Payer: Medicare HMO | Admitting: Gastroenterology

## 2021-08-28 ENCOUNTER — Encounter: Payer: Self-pay | Admitting: Gastroenterology

## 2021-08-28 VITALS — BP 98/67 | HR 74 | Temp 96.8°F | Ht 62.0 in | Wt 152.0 lb

## 2021-08-28 DIAGNOSIS — R634 Abnormal weight loss: Secondary | ICD-10-CM

## 2021-08-28 DIAGNOSIS — R6881 Early satiety: Secondary | ICD-10-CM

## 2021-08-28 DIAGNOSIS — R932 Abnormal findings on diagnostic imaging of liver and biliary tract: Secondary | ICD-10-CM

## 2021-08-28 DIAGNOSIS — R630 Anorexia: Secondary | ICD-10-CM

## 2021-08-28 DIAGNOSIS — D649 Anemia, unspecified: Secondary | ICD-10-CM

## 2021-08-28 NOTE — Patient Instructions (Signed)
We will arrange for you to have an ultrasound of your abdomen in the near future at [facility].  Please have blood work completed.   Pending blood work results, we will proceed with an upper endoscopy and may consider a colonoscopy if you have evidence of iron deficiency anemia.  Try eating 4-6 small meals daily.  Also recommend drinking a couple of protein or nutritional shakes daily such as Ensure or boost to help maintain your nutrition.   It was a pleasure meeting you today!  Have a happy new year!  Aliene Altes, PA-C [facility]

## 2021-08-29 ENCOUNTER — Encounter: Payer: Self-pay | Admitting: Adult Health

## 2021-08-29 ENCOUNTER — Encounter (HOSPITAL_COMMUNITY)
Admission: RE | Admit: 2021-08-29 | Discharge: 2021-08-29 | Disposition: A | Discharge status: Home / Self Care | Payer: Medicare HMO | Source: Skilled Nursing Facility | Attending: Internal Medicine | Admitting: Internal Medicine

## 2021-08-29 ENCOUNTER — Non-Acute Institutional Stay (SKILLED_NURSING_FACILITY): Payer: Medicare HMO | Admitting: Adult Health

## 2021-08-29 DIAGNOSIS — D649 Anemia, unspecified: Secondary | ICD-10-CM | POA: Insufficient documentation

## 2021-08-29 DIAGNOSIS — E1122 Type 2 diabetes mellitus with diabetic chronic kidney disease: Secondary | ICD-10-CM | POA: Diagnosis not present

## 2021-08-29 DIAGNOSIS — I129 Hypertensive chronic kidney disease with stage 1 through stage 4 chronic kidney disease, or unspecified chronic kidney disease: Secondary | ICD-10-CM

## 2021-08-29 DIAGNOSIS — N1831 Chronic kidney disease, stage 3a: Secondary | ICD-10-CM | POA: Diagnosis not present

## 2021-08-29 DIAGNOSIS — E875 Hyperkalemia: Secondary | ICD-10-CM | POA: Diagnosis not present

## 2021-08-29 LAB — CBC
HCT: 27.6 % — ABNORMAL LOW (ref 36.0–46.0)
Hemoglobin: 9 g/dL — ABNORMAL LOW (ref 12.0–15.0)
MCH: 30.7 pg (ref 26.0–34.0)
MCHC: 32.6 g/dL (ref 30.0–36.0)
MCV: 94.2 fL (ref 80.0–100.0)
Platelets: 244 10*3/uL (ref 150–400)
RBC: 2.93 MIL/uL — ABNORMAL LOW (ref 3.87–5.11)
RDW: 16.2 % — ABNORMAL HIGH (ref 11.5–15.5)
WBC: 5.6 10*3/uL (ref 4.0–10.5)
nRBC: 0 % (ref 0.0–0.2)

## 2021-08-29 LAB — PROTIME-INR
INR: 1 (ref 0.8–1.2)
Prothrombin Time: 13.7 seconds (ref 11.4–15.2)

## 2021-08-29 LAB — COMPREHENSIVE METABOLIC PANEL
ALT: 14 U/L (ref 0–44)
AST: 15 U/L (ref 15–41)
Albumin: 3.5 g/dL (ref 3.5–5.0)
Alkaline Phosphatase: 47 U/L (ref 38–126)
Anion gap: 9 (ref 5–15)
BUN: 80 mg/dL — ABNORMAL HIGH (ref 8–23)
CO2: 15 mmol/L — ABNORMAL LOW (ref 22–32)
Calcium: 9 mg/dL (ref 8.9–10.3)
Chloride: 110 mmol/L (ref 98–111)
Creatinine, Ser: 1.35 mg/dL — ABNORMAL HIGH (ref 0.44–1.00)
GFR, Estimated: 39 mL/min — ABNORMAL LOW (ref >60)
Glucose, Bld: 106 mg/dL — ABNORMAL HIGH (ref 70–99)
Potassium: 5.8 mmol/L — ABNORMAL HIGH (ref 3.5–5.1)
Sodium: 134 mmol/L — ABNORMAL LOW (ref 135–145)
Total Bilirubin: 0.1 mg/dL — ABNORMAL LOW (ref 0.3–1.2)
Total Protein: 6.7 g/dL (ref 6.5–8.1)

## 2021-08-29 LAB — FOLATE: Folate: 7.7 ng/mL (ref >5.9)

## 2021-08-29 LAB — FERRITIN: Ferritin: 178 ng/mL (ref 11–307)

## 2021-08-29 LAB — VITAMIN B12: Vitamin B-12: 446 pg/mL (ref 180–914)

## 2021-08-29 LAB — IRON AND TIBC
Iron: 79 ug/dL (ref 28–170)
Saturation Ratios: 28 % (ref 10.4–31.8)
TIBC: 280 ug/dL (ref 250–450)
UIBC: 201 ug/dL

## 2021-08-29 NOTE — Progress Notes (Signed)
Location:  [place] Room Number: 154-W Place of Service:  SNF (31)   CODE STATUS: DNR  Allergies  Allergen Reactions   Ace Inhibitors Other (See Comments)    Hyperkalemia    Codeine    Sulfa Antibiotics     Chief Complaint  Patient presents with   Acute Visit    Lab follow-up    HPI:  Her k+ level today is 5.8 and she is taking lisinopril 2.5 mg daily. She denies any chest pain. Denies any palpitations.   Past Medical History:  Diagnosis Date   Anemia    Depression    DM type 2 with diabetic peripheral neuropathy (HCC)    High cholesterol    HTN (hypertension)    Neck pain    Neuropathy    Urinary retention     Past Surgical History:  Procedure Laterality Date   ABDOMINAL HYSTERECTOMY     ANTERIOR CERVICAL DECOMP/DISCECTOMY FUSION N/A 02/05/2021   Procedure: Cervical Three-Four  Anterior cervical decompression/discectomy/fusion;  Surgeon: Ashok Pall, MD;  Location: [facility];  Service: Neurosurgery;  Laterality: N/A;  RM 20   APPENDECTOMY     BACK SURGERY     CERVICAL DISC SURGERY     HAND SURGERY     KNEE SURGERY      Social History   Socioeconomic History   Marital status: Widowed    Spouse name: Not on file   Number of children: Not on file   Years of education: Not on file   Highest education level: Not on file  Occupational History   Not on file  Tobacco Use   Smoking status: Never   Smokeless tobacco: Never  Vaping Use   Vaping Use: Never used  Substance and Sexual Activity   Alcohol use: Never   Drug use: Never   Sexual activity: Never  Other Topics Concern   Not on file  Social History Narrative   Not on file   Social Determinants of Health   Financial Resource Strain: Not on file  Food Insecurity: Not on file  Transportation Needs: Not on file  Physical Activity: Not on file  Stress: Not on file  Social Connections: Not on file  Intimate Partner Violence: Not on file   Family History  Problem Relation  Age of Onset   Cancer Mother    Cardiomyopathy Father    Colon cancer Neg Hx       VITAL SIGNS BP 129/74    Pulse 85    Temp (!) 97.3 F (36.3 C)    Resp 18    Ht 5\' 2"  (1.575 m)    Wt 156 lb 8 oz (71 kg)    SpO2 95%    BMI 28.62 kg/m   Outpatient Encounter Medications as of 08/29/2021  Medication Sig   acetaminophen (TYLENOL) 325 MG tablet Take 650 mg by mouth every 8 (eight) hours.   albuterol (VENTOLIN HFA) 108 (90 Base) MCG/ACT inhaler Inhale 1 puff into the lungs every 4 (four) hours as needed for wheezing or shortness of breath.   AMBULATORY NON FORMULARY MEDICATION Medication Name:  Continue monthly catheter changes with 86fr catheter at skilled nursing facility. (Patient taking differently: Medication Name:  Continue monthly catheter changes with 75fr catheter at skilled nursing facility.)   Artificial Saliva (BIOTENE MOISTURIZING MOUTH MT) Use as directed 1 application in the mouth or throat at bedtime. 9 pm   aspirin EC 81 MG tablet Take 81 mg by mouth daily.  Swallow whole.9 am   atorvastatin (LIPITOR) 20 MG tablet Take 1 tablet (20 mg total) by mouth every evening.   Balsam [place]-Castor Oil (VENELEX) OINT Apply topically. Apply to sacrum, coccyx, bilateral buttocks qshift for prevention.   chlorthalidone (HYGROTON) 25 MG tablet Take 25 mg by mouth daily.   colchicine 0.6 MG tablet Take 0.6 mg by mouth daily as needed.   DULoxetine (CYMBALTA) 60 MG capsule Take 60 mg by mouth daily.   gabapentin (NEURONTIN) 100 MG capsule Take 1 capsule (100 mg total) by mouth 2 (two) times daily.   guaiFENesin (MUCINEX) 600 MG 12 hr tablet Take by mouth 2 (two) times daily.   insulin glargine (LANTUS) 100 UNIT/ML injection Inject 20 Units into the skin at bedtime.   insulin lispro (HUMALOG) 100 UNIT/ML KwikPen Inject 5 Units into the skin 2 (two) times daily with a meal.   levETIRAcetam (KEPPRA) 750 MG tablet Take 750 mg by mouth 2 (two) times daily.   melatonin 3 MG TABS tablet Take 3 mg by  mouth at bedtime.   metoCLOPramide (REGLAN) 5 MG tablet Take 2.5 mg by mouth 4 (four) times daily.   mirtazapine (REMERON) 7.5 MG tablet Take 15 mg by mouth at bedtime.   NON FORMULARY Diet - NAS, Cons CHO   omeprazole (PRILOSEC) 40 MG capsule Take 40 mg by mouth daily.   rOPINIRole (REQUIP) 1 MG tablet Take 1 mg by mouth at bedtime.   tiZANidine (ZANAFLEX) 2 MG tablet Take 2 mg by mouth every 6 (six) hours as needed for muscle spasms (for back and sciatic pain).   [DISCONTINUED] lisinopril (ZESTRIL) 2.5 MG tablet Take 2.5 mg by mouth daily.   No facility-administered encounter medications on file as of 08/29/2021.     SIGNIFICANT DIAGNOSTIC EXAMS   PREVIOUS   12-02-20; ct of head: No acute intracranial abnormality. Polypoid sinus disease.  12-02-20: ct of cervical spine:  Postoperative and degenerative changes in the cervical spine. No acute bony abnormality.  12-02-20; ct of pelvis:  1. No evidence of acute fracture or dislocation of the pelvis or hips. 2. 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 3. Degenerative changes in the hips with subcortical cysts on the left hip.  12-02-20: ct of left knee:  1. Subtle acute nondisplaced fracture through the medial aspect of the head of the fibula. 2. 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 3. There is a small joint effusion.  12-02-20: mri left hip:  1. No acute findings involving the left hip. 2. Mild chronic bilateral hip avascular necrosis. Moderate degenerative hip findings bilaterally. No hip effusion or regional bursitis. 3. Mild left greater than right hamstring tendinopathy. 4. 1.3 cm left eccentric Bartholin's cyst or Gartner cyst.  12-02-20: mri lumbar spine:  1. 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 2. L3-4: Bilateral posterolateral disc bulges, more prominent on the left. Mild left foraminal encroachment that could possibly affect the left  L3 nerve. Moderate to severe bilateral facet osteoarthritis. These findings could relate to back pain or referred facet syndrome pain. 3. L4-5: Bilateral facet arthropathy with 2 mm of anterolisthesis. Bulging of the disc more prominent towards the right. Mild narrowing of the lateral recesses and of the intervertebral foramen on the right, but without visible neural compression. Findings at this level could relate to back pain or referred facet syndrome pain. 4. L5-S1: Previous left hemilaminectomy. Endplate osteophytes and bulging of the disc more prominent towards the left. Facet degeneration and hypertrophy left  more than right with left foraminal stenosis and subarticular lateral recess stenosis could cause left-sided neural compression.   01-25-21: ct of head; maxillofacial cervical spine:  1. No acute intracranial pathology. 2. No acute/traumatic cervical spine pathology. 3. No acute facial bone fractures.  01-26-21: ct of chest abdomen and pelvis:  1. No noncontrast CT evidence of acute traumatic injury to the chest, abdomen, or pelvis. 2. Distended urinary bladder. Mild bilateral hydronephrosis and hydroureter to the bilateral ureterovesicular junctions. No obstructing calculus or other etiology identified. Correlate for urinary retention. 3. No fracture or dislocation of the lumbar spine. Moderate disc space height loss and osteophytosis at L5-S1. Probable small broad-based posterior disc bulges at L4-L5 and L5-S1. 4. Coronary artery disease. Aortic Atherosclerosis  01-26-21: ct of right shoulder:  1.  No acute osseous injury of the right shoulder. 2. Mild mineralization in the infraspinatus tendon as can be seen with calcific tendinosis.  01-29-21: MRI of spine:  Postoperative and multilevel degenerative changes of the cervical spine. There is severe canal stenosis with cord compression at C3-C4. Abnormal cord signal is present just below this level (at operative levels) and may reflect  edema or myelomalacia No significant degenerative changes of the thoracic spine. Multilevel degenerative changes of lumbar spine similar to recent prior study.  01-30-21: renal ultrasound:  1. No acute abnormality. Small amount of right perinephric fluid, nonspecific.  02-05-21: chest x-ray:  1. Right internal jugular central venous catheter with tip over the mid SVC. No pneumothorax. 2. Hazy lung base opacities likely represent combination of atelectasis and pleural fluid. Vascular congestion.   03-06-21 chest x-ray: left lower lobe infiltrate likely representing pneumonia   03-26-21: lumbar x-ray: mild to moderate degenerative changes in lumbar spine  03-27-21; DEXA scan: t score -1.777  06-22-21: ct of abdomen and pelvis:  Left colonic diverticulosis.  No active diverticulitis. Bilateral hydronephrosis to the level of the bladder. No obstructing stones. Urinary bladder is distended with Foley catheter in place. Aortic atherosclerosis.  07-06-21: ct head:  No intracranial abnormality or other acute findings. Chronic bilateral maxillary sinus disease.  07-06-21: ct of abdomen and pelvis:  1. Heterogeneous enhancement of the left kidney with mild left perinephric edema, suspicious for pyelonephritis. There is also mild bladder wall thickening and perivesicular fat stranding with Foley catheter in place. Improvement in bilateral hydronephrosis from prior exam. 2. Stool distends the rectum at 7.6 cm with mild distal rectal wall thickening, suspicious for fecal impaction. 3. Colonic diverticulosis without diverticulitis. Aortic Atherosclerosis   07-07-21: MRI of head:  No acute intracranial finding. No sign of acute infarction. No lesion seen to explain seizure. The patient does have mild chronic small-vessel ischemic changes considering age affecting the pons, thalami and cerebral hemispheric white matter. Left mastoid effusion  NO NEW EXAMS.      LABS REVIEWED PREVIOUS   12-02-20: wbc  6.4; hgb 11.3; hct 34.4; mcv 93.0 plt 216; glucose 209; bun 33; creat 1.13; k+ 4.3; na++ 137; ca 9.0; GFR 49; hgb a1c 8.6 12-03-20: wbc 6.5; hgb 9.8; hct 30.5 mcv 93.8 plt 182; glucose 188;bun 41; creat 1.49; k+ 4.4; na++ 134; ca 8.2; GFR 35 12-04-20; glucose 285; bun 29; creat 1.00; k+ 4.3; na++ 138; ca 8.7 GFR 56  01-25-21: wbc 6.4; hgb 10.6; hct 31.7; mcv 91.1 plt 227; glucose 152; bun 40; creat 1.2 k+ 4.2; na++ 135; ca 8.7; GFR 42; liver normal albumin 3.5 01-29-21: urine and blood culture: klebsiella pneumoniae 01-31-21: wbc 13.1; hgb 8.3; hct 23.6;  mcv 88.1 plt 153; glucose 162; bun 67; creat 2.77; k+ 4.8; na++ 125; ca 7.5; GFR 17; ast 62; alt 57; albumin 1.7 02-04-21: wbc 9.8; hgb 7.6; hct 22.0; mcv 85.3 plt 212; glucose 161 bun 40; creat 1.40; k+ 3.4; na++ 127; ca 7.5; GFR 38; ast 62; alt 57; albumin 1.7  02-08-21: wbc 10.4; hgb 9.2; hct 27.6; mcv 87.3 plt 433; glucose 186; bun 23; creat 1.00 ;k+ 4.4; na++ 131; ca 8.1; GFR 56; ast 60; alt 61; albumin 2.1  02-20-21: hgb 9.4; hct 29.9 glucose 154; bun 51; creat 1.17; k+ 4.5; na++ 136; ca 8.9; GFR 47 02-24-21: glucose 175; bun 62; creat 1.13; k+ 4.5; na++ 133; ca 9.0 GFR 49 03-06-21: wbc 5.3; hgb 8.8; hct 27.1; mcv 93.4 plt 262; glucose 453; bun 66; creat 1.30; k+ 4.6; na++ 135; ca 9.0; GFR 41; d-dimer: 2.16; CRP 3.0  03-13-21: d-dimer: 1.49 03-20-21: hep C nr; d-dimer 0.86 03-27-21: d-dimer 0.52 03-31-21: wbc 5.5; hgb 8.5; hct 26.8; mcv 93.4 plt 203; hgb a1c 7.6; chol 130; ldl 68; trig 138; hdl 34; urine micro-albumin 25.1 06-12-21: uric acid 5.9 06-15-21: wbc 6.0; hgb 9.8; hct 29.4; mcv 90,7 plt 242; glucose 187; bun 65; creat 1.33; k+ 4.2; na++ 131; ca 8.9; GFR 40; liver normal albumin 3.3  06-19-21: wbc 7.3; hgb 9.9; hct 30.1; mcv 89,9 plt 306; glucose 115; bun 29; creat 1.05; k+ 4.1; na++ 131; ca 8.7; GFR 53; liver normal albumin 3.1; urine micro-albumin 85.3 06-22-21; wbc 11.8; hgb 10.4; hct 30.9; mcv 89,3 plt 375; glucose 288; bun 44; creat 1.46; k+ 3.5;  na++ 125; ca 8.3 GFR 36; liver normal albumin 3.3; lipase 37  07-06-21: wbc 7.6; hgb 9.7; hct 29.8; mcv 89,2 plt 409; glucose 92; bun 44; creat 1.09; k+ 5,3l na++ 136; ca 9.4; GFR 51; liver normal albumin 3.4 Hgb A1C: 8.3; urine culture: proteus mirabilis; + 30,000 colonies klebsiella pneumoniae   TODAY  08-29-21: wbc 5.6; hgb 9.0; hct 27.6; mcv 94.2 plt 244; glucose 106; bun 80; creat 1.35; k+ 5.8; na++ 134; ca 9.0; GFR 39 liver normal albumin 3.5 vit B 12: 446; folate 7.7; iron 79 tibc 280; ferritin 178  Review of Systems  Constitutional:  Negative for malaise/fatigue.  Respiratory:  Negative for cough and shortness of breath.   Cardiovascular:  Negative for chest pain, palpitations and leg swelling.  Gastrointestinal:  Negative for abdominal pain, constipation and heartburn.  Musculoskeletal:  Negative for back pain, joint pain and myalgias.  Skin: Negative.   Neurological:  Negative for dizziness.  Psychiatric/Behavioral:  The patient is not nervous/anxious.    Physical Exam Constitutional:      General: She is not in acute distress.    Appearance: She is well-developed. She is not diaphoretic.  Neck:     Thyroid: No thyromegaly.  Cardiovascular:     Rate and Rhythm: Normal rate and regular rhythm.     Pulses: Normal pulses.     Heart sounds: Normal heart sounds.  Pulmonary:     Effort: Pulmonary effort is normal. No respiratory distress.     Breath sounds: Normal breath sounds.  Abdominal:     General: Bowel sounds are normal. There is no distension.     Palpations: Abdomen is soft.     Tenderness: There is no abdominal tenderness.  Musculoskeletal:        General: Normal range of motion.     Cervical back: Neck supple.     Right lower leg: No edema.  Left lower leg: No edema.     Comments:  Has weakness in all extremities Bilateral grips are weak    Lymphadenopathy:     Cervical: No cervical adenopathy.  Skin:    General: Skin is warm and dry.  Neurological:      Mental Status: She is alert and oriented to person, place, and time.  Psychiatric:        Mood and Affect: Mood normal.     ASSESSMENT/ PLAN:  TODAY  Hypertension associated with stage 3a chronic kidney disease due to type 2 diabetes mellitus Hyperkalemia  Is worse:  Will stop lisinopril due to renal function and hyperkalemia will repeat BMP 09-04-21 Will monitor her status.    [name] NP [facility]  Contact [phone] Monday through Friday 8am- 5pm  After hours call [phone]

## 2021-09-01 ENCOUNTER — Encounter: Payer: Self-pay | Admitting: Gastroenterology

## 2021-09-02 DIAGNOSIS — E875 Hyperkalemia: Secondary | ICD-10-CM | POA: Insufficient documentation

## 2021-09-04 ENCOUNTER — Other Ambulatory Visit (HOSPITAL_COMMUNITY)
Admission: RE | Admit: 2021-09-04 | Discharge: 2021-09-04 | Disposition: A | Discharge status: Home / Self Care | Payer: Medicare HMO | Source: Skilled Nursing Facility | Attending: Adult Health | Admitting: Adult Health

## 2021-09-04 ENCOUNTER — Non-Acute Institutional Stay (SKILLED_NURSING_FACILITY): Payer: Medicare HMO | Admitting: Adult Health

## 2021-09-04 ENCOUNTER — Encounter: Payer: Self-pay | Admitting: Adult Health

## 2021-09-04 DIAGNOSIS — Z794 Long term (current) use of insulin: Secondary | ICD-10-CM | POA: Diagnosis not present

## 2021-09-04 DIAGNOSIS — N1832 Chronic kidney disease, stage 3b: Secondary | ICD-10-CM

## 2021-09-04 DIAGNOSIS — Z1159 Encounter for screening for other viral diseases: Secondary | ICD-10-CM | POA: Diagnosis not present

## 2021-09-04 DIAGNOSIS — R053 Chronic cough: Secondary | ICD-10-CM

## 2021-09-04 DIAGNOSIS — E1122 Type 2 diabetes mellitus with diabetic chronic kidney disease: Secondary | ICD-10-CM

## 2021-09-04 DIAGNOSIS — I10 Essential (primary) hypertension: Secondary | ICD-10-CM | POA: Insufficient documentation

## 2021-09-04 DIAGNOSIS — I700 Atherosclerosis of aorta: Secondary | ICD-10-CM

## 2021-09-04 DIAGNOSIS — R279 Unspecified lack of coordination: Secondary | ICD-10-CM | POA: Diagnosis not present

## 2021-09-04 DIAGNOSIS — M6281 Muscle weakness (generalized): Secondary | ICD-10-CM | POA: Diagnosis not present

## 2021-09-04 DIAGNOSIS — G8252 Quadriplegia, C1-C4 incomplete: Secondary | ICD-10-CM | POA: Diagnosis not present

## 2021-09-04 LAB — BASIC METABOLIC PANEL
Anion gap: 7 (ref 5–15)
BUN: 79 mg/dL — ABNORMAL HIGH (ref 8–23)
CO2: 17 mmol/L — ABNORMAL LOW (ref 22–32)
Calcium: 9.2 mg/dL (ref 8.9–10.3)
Chloride: 111 mmol/L (ref 98–111)
Creatinine, Ser: 1.44 mg/dL — ABNORMAL HIGH (ref 0.44–1.00)
GFR, Estimated: 36 mL/min — ABNORMAL LOW (ref >60)
Glucose, Bld: 66 mg/dL — ABNORMAL LOW (ref 70–99)
Potassium: 5.5 mmol/L — ABNORMAL HIGH (ref 3.5–5.1)
Sodium: 135 mmol/L (ref 135–145)

## 2021-09-04 NOTE — Progress Notes (Signed)
Location:  [facility] Room Number: 154-W Place of Service:  SNF (31)   CODE STATUS: DNR  Allergies  Allergen Reactions   Ace Inhibitors Other (See Comments)    Hyperkalemia    Codeine    Sulfa Antibiotics     Chief Complaint  Patient presents with   Acute Visit    Diabetes     HPI:  Sh is presently taking lantus 30 units nightly along with humalog twice daily with meals for her diabetes. Her AM cbg readings are slightly low. She denies any excessive thirst hunger or agitation. There are no changes in her appetite.   Past Medical History:  Diagnosis Date   Anemia    CKD (chronic kidney disease)    Depression    DM type 2 with diabetic peripheral neuropathy (HCC)    GERD (gastroesophageal reflux disease)    High cholesterol    HTN (hypertension)    Hyponatremia    Neck pain    Neuropathy    Quadriplegia, C1-C4 incomplete ([place])    Urinary retention     Past Surgical History:  Procedure Laterality Date   ABDOMINAL HYSTERECTOMY     ANTERIOR CERVICAL DECOMP/DISCECTOMY FUSION N/A 02/05/2021   Procedure: Cervical Three-Four  Anterior cervical decompression/discectomy/fusion;  Surgeon: Ashok Pall, MD;  Location: [facility];  Service: Neurosurgery;  Laterality: N/A;  RM 20   APPENDECTOMY     BACK SURGERY     CERVICAL DISC SURGERY     HAND SURGERY     KNEE SURGERY      Social History   Socioeconomic History   Marital status: Widowed    Spouse name: Not on file   Number of children: Not on file   Years of education: Not on file   Highest education level: Not on file  Occupational History   Not on file  Tobacco Use   Smoking status: Never   Smokeless tobacco: Never  Vaping Use   Vaping Use: Never used  Substance and Sexual Activity   Alcohol use: Never   Drug use: Never   Sexual activity: Never  Other Topics Concern   Not on file  Social History Narrative   Not on file   Social Determinants of Health   Financial Resource Strain:  Not on file  Food Insecurity: Not on file  Transportation Needs: Not on file  Physical Activity: Not on file  Stress: Not on file  Social Connections: Not on file  Intimate Partner Violence: Not on file   Family History  Problem Relation Age of Onset   Cancer Mother    Cardiomyopathy Father    Colon cancer Neg Hx       VITAL SIGNS BP 138/76    Pulse 80    Temp 98 F (36.7 C)    Resp 18    Ht 5\' 2"  (1.575 m)    Wt 156 lb 8 oz (71 kg)    BMI 28.62 kg/m   Outpatient Encounter Medications as of 09/04/2021  Medication Sig   acetaminophen (TYLENOL) 325 MG tablet Take 650 mg by mouth every 8 (eight) hours.   albuterol (VENTOLIN HFA) 108 (90 Base) MCG/ACT inhaler Inhale 1 puff into the lungs every 4 (four) hours as needed for wheezing or shortness of breath.   Artificial Saliva (BIOTENE MOISTURIZING MOUTH MT) Use as directed 1 application in the mouth or throat at bedtime. 9 pm   aspirin EC 81 MG tablet Take 81 mg by mouth daily.  Swallow whole.9 am   atorvastatin (LIPITOR) 20 MG tablet Take 1 tablet (20 mg total) by mouth every evening.   Balsam [place]-Castor Oil (VENELEX) OINT Apply topically. Apply to sacrum, coccyx, bilateral buttocks qshift for prevention.   chlorthalidone (HYGROTON) 25 MG tablet Take 25 mg by mouth daily.   colchicine 0.6 MG tablet Take 0.6 mg by mouth daily as needed.   DULoxetine (CYMBALTA) 60 MG capsule Take 60 mg by mouth daily.   gabapentin (NEURONTIN) 100 MG capsule Take 1 capsule (100 mg total) by mouth 2 (two) times daily.   guaiFENesin (MUCINEX) 600 MG 12 hr tablet Take by mouth 2 (two) times daily.   insulin glargine (LANTUS) 100 UNIT/ML injection Inject 20 Units into the skin at bedtime.   insulin lispro (HUMALOG) 100 UNIT/ML KwikPen Inject 5 Units into the skin 2 (two) times daily with a meal.   levETIRAcetam (KEPPRA) 750 MG tablet Take 750 mg by mouth 2 (two) times daily.   melatonin 3 MG TABS tablet Take 3 mg by mouth at bedtime.   metoCLOPramide  (REGLAN) 5 MG tablet Take 2.5 mg by mouth 4 (four) times daily.   mirtazapine (REMERON) 15 MG tablet Take 15 mg by mouth at bedtime.   NON FORMULARY Diet - NAS, Cons CHO   omeprazole (PRILOSEC) 40 MG capsule Take 40 mg by mouth daily.   rOPINIRole (REQUIP) 1 MG tablet Take 1 mg by mouth at bedtime.   tiZANidine (ZANAFLEX) 2 MG tablet Take 2 mg by mouth every 6 (six) hours as needed for muscle spasms (for back and sciatic pain).   AMBULATORY NON FORMULARY MEDICATION Medication Name:  Continue monthly catheter changes with 43fr catheter at skilled nursing facility. (Patient taking differently: Medication Name:  Continue monthly catheter changes with 38fr catheter at skilled nursing facility.)   [DISCONTINUED] mirtazapine (REMERON) 7.5 MG tablet Take 15 mg by mouth at bedtime.   No facility-administered encounter medications on file as of 09/04/2021.     SIGNIFICANT DIAGNOSTIC EXAMS  PREVIOUS   12-02-20; ct of head: No acute intracranial abnormality. Polypoid sinus disease.  12-02-20: ct of cervical spine:  Postoperative and degenerative changes in the cervical spine. No acute bony abnormality.  12-02-20; ct of pelvis:  1. No evidence of acute fracture or dislocation of the pelvis or hips. 2. 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 3. Degenerative changes in the hips with subcortical cysts on the left hip.  12-02-20: ct of left knee:  1. Subtle acute nondisplaced fracture through the medial aspect of the head of the fibula. 2. 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 3. There is a small joint effusion.  12-02-20: mri left hip:  1. No acute findings involving the left hip. 2. Mild chronic bilateral hip avascular necrosis. Moderate degenerative hip findings bilaterally. No hip effusion or regional bursitis. 3. Mild left greater than right hamstring tendinopathy. 4. 1.3 cm left eccentric Bartholin's cyst or Gartner  cyst.  12-02-20: mri lumbar spine:  1. 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 2. L3-4: Bilateral posterolateral disc bulges, more prominent on the left. Mild left foraminal encroachment that could possibly affect the left L3 nerve. Moderate to severe bilateral facet osteoarthritis. These findings could relate to back pain or referred facet syndrome pain. 3. L4-5: Bilateral facet arthropathy with 2 mm of anterolisthesis. Bulging of the disc more prominent towards the right. Mild narrowing of the lateral recesses and of the intervertebral foramen on the right, but without visible neural  compression. Findings at this level could relate to back pain or referred facet syndrome pain. 4. L5-S1: Previous left hemilaminectomy. Endplate osteophytes and bulging of the disc more prominent towards the left. Facet degeneration and hypertrophy left more than right with left foraminal stenosis and subarticular lateral recess stenosis could cause left-sided neural compression.   01-25-21: ct of head; maxillofacial cervical spine:  1. No acute intracranial pathology. 2. No acute/traumatic cervical spine pathology. 3. No acute facial bone fractures.  01-26-21: ct of chest abdomen and pelvis:  1. No noncontrast CT evidence of acute traumatic injury to the chest, abdomen, or pelvis. 2. Distended urinary bladder. Mild bilateral hydronephrosis and hydroureter to the bilateral ureterovesicular junctions. No obstructing calculus or other etiology identified. Correlate for urinary retention. 3. No fracture or dislocation of the lumbar spine. Moderate disc space height loss and osteophytosis at L5-S1. Probable small broad-based posterior disc bulges at L4-L5 and L5-S1. 4. Coronary artery disease. Aortic Atherosclerosis  01-26-21: ct of right shoulder:  1.  No acute osseous injury of the right shoulder. 2. Mild mineralization in the infraspinatus tendon as can be seen with calcific tendinosis.  01-29-21: MRI  of spine:  Postoperative and multilevel degenerative changes of the cervical spine. There is severe canal stenosis with cord compression at C3-C4. Abnormal cord signal is present just below this level (at operative levels) and may reflect edema or myelomalacia No significant degenerative changes of the thoracic spine. Multilevel degenerative changes of lumbar spine similar to recent prior study.  01-30-21: renal ultrasound:  1. No acute abnormality. Small amount of right perinephric fluid, nonspecific.  02-05-21: chest x-ray:  1. Right internal jugular central venous catheter with tip over the mid SVC. No pneumothorax. 2. Hazy lung base opacities likely represent combination of atelectasis and pleural fluid. Vascular congestion.   03-06-21 chest x-ray: left lower lobe infiltrate likely representing pneumonia   03-26-21: lumbar x-ray: mild to moderate degenerative changes in lumbar spine  03-27-21; DEXA scan: t score -1.777  06-22-21: ct of abdomen and pelvis:  Left colonic diverticulosis.  No active diverticulitis. Bilateral hydronephrosis to the level of the bladder. No obstructing stones. Urinary bladder is distended with Foley catheter in place. Aortic atherosclerosis.  07-06-21: ct head:  No intracranial abnormality or other acute findings. Chronic bilateral maxillary sinus disease.  07-06-21: ct of abdomen and pelvis:  1. Heterogeneous enhancement of the left kidney with mild left perinephric edema, suspicious for pyelonephritis. There is also mild bladder wall thickening and perivesicular fat stranding with Foley catheter in place. Improvement in bilateral hydronephrosis from prior exam. 2. Stool distends the rectum at 7.6 cm with mild distal rectal wall thickening, suspicious for fecal impaction. 3. Colonic diverticulosis without diverticulitis. Aortic Atherosclerosis   07-07-21: MRI of head:  No acute intracranial finding. No sign of acute infarction. No lesion seen to explain  seizure. The patient does have mild chronic small-vessel ischemic changes considering age affecting the pons, thalami and cerebral hemispheric white matter. Left mastoid effusion  NO NEW EXAMS.      LABS REVIEWED PREVIOUS   12-02-20: wbc 6.4; hgb 11.3; hct 34.4; mcv 93.0 plt 216; glucose 209; bun 33; creat 1.13; k+ 4.3; na++ 137; ca 9.0; GFR 49; hgb a1c 8.6 12-03-20: wbc 6.5; hgb 9.8; hct 30.5 mcv 93.8 plt 182; glucose 188;bun 41; creat 1.49; k+ 4.4; na++ 134; ca 8.2; GFR 35 12-04-20; glucose 285; bun 29; creat 1.00; k+ 4.3; na++ 138; ca 8.7 GFR 56  01-25-21: wbc 6.4; hgb 10.6; hct  31.7; mcv 91.1 plt 227; glucose 152; bun 40; creat 1.2 k+ 4.2; na++ 135; ca 8.7; GFR 42; liver normal albumin 3.5 01-29-21: urine and blood culture: klebsiella pneumoniae 01-31-21: wbc 13.1; hgb 8.3; hct 23.6; mcv 88.1 plt 153; glucose 162; bun 67; creat 2.77; k+ 4.8; na++ 125; ca 7.5; GFR 17; ast 62; alt 57; albumin 1.7 02-04-21: wbc 9.8; hgb 7.6; hct 22.0; mcv 85.3 plt 212; glucose 161 bun 40; creat 1.40; k+ 3.4; na++ 127; ca 7.5; GFR 38; ast 62; alt 57; albumin 1.7  02-08-21: wbc 10.4; hgb 9.2; hct 27.6; mcv 87.3 plt 433; glucose 186; bun 23; creat 1.00 ;k+ 4.4; na++ 131; ca 8.1; GFR 56; ast 60; alt 61; albumin 2.1  02-20-21: hgb 9.4; hct 29.9 glucose 154; bun 51; creat 1.17; k+ 4.5; na++ 136; ca 8.9; GFR 47 02-24-21: glucose 175; bun 62; creat 1.13; k+ 4.5; na++ 133; ca 9.0 GFR 49 03-06-21: wbc 5.3; hgb 8.8; hct 27.1; mcv 93.4 plt 262; glucose 453; bun 66; creat 1.30; k+ 4.6; na++ 135; ca 9.0; GFR 41; d-dimer: 2.16; CRP 3.0  03-13-21: d-dimer: 1.49 03-20-21: hep C nr; d-dimer 0.86 03-27-21: d-dimer 0.52 03-31-21: wbc 5.5; hgb 8.5; hct 26.8; mcv 93.4 plt 203; hgb a1c 7.6; chol 130; ldl 68; trig 138; hdl 34; urine micro-albumin 25.1 06-12-21: uric acid 5.9 06-15-21: wbc 6.0; hgb 9.8; hct 29.4; mcv 90,7 plt 242; glucose 187; bun 65; creat 1.33; k+ 4.2; na++ 131; ca 8.9; GFR 40; liver normal albumin 3.3  06-19-21: wbc 7.3; hgb 9.9; hct  30.1; mcv 89,9 plt 306; glucose 115; bun 29; creat 1.05; k+ 4.1; na++ 131; ca 8.7; GFR 53; liver normal albumin 3.1; urine micro-albumin 85.3 06-22-21; wbc 11.8; hgb 10.4; hct 30.9; mcv 89,3 plt 375; glucose 288; bun 44; creat 1.46; k+ 3.5; na++ 125; ca 8.3 GFR 36; liver normal albumin 3.3; lipase 37  07-06-21: wbc 7.6; hgb 9.7; hct 29.8; mcv 89,2 plt 409; glucose 92; bun 44; creat 1.09; k+ 5,3l na++ 136; ca 9.4; GFR 51; liver normal albumin 3.4  Hgb A1C: 8.3; urine culture: proteus mirabilis; + 30,000 colonies klebsiella pneumoniae  08-29-21: wbc 5.6; hgb 9.0; hct 27.6; mcv 94.2 plt 244; glucose 106; bun 80; creat 1.35; k+ 5.8; na++ 134; ca 9.0; GFR 39 liver normal albumin 3.5 vit B 12: 446; folate 7.7; iron 79 tibc 280; ferritin 178  NO NEW LABS.   Review of Systems  Constitutional:  Negative for malaise/fatigue.  Respiratory:  Positive for cough. Negative for shortness of breath.   Cardiovascular:  Negative for chest pain, palpitations and leg swelling.  Gastrointestinal:  Negative for abdominal pain, constipation and heartburn.  Musculoskeletal:  Negative for back pain, joint pain and myalgias.  Skin: Negative.   Neurological:  Negative for dizziness.  Psychiatric/Behavioral:  The patient is not nervous/anxious.    Physical Exam Constitutional:      General: She is not in acute distress.    Appearance: She is well-developed. She is not diaphoretic.  Neck:     Thyroid: No thyromegaly.  Cardiovascular:     Rate and Rhythm: Normal rate and regular rhythm.     Pulses: Normal pulses.     Heart sounds: Normal heart sounds.  Pulmonary:     Effort: Pulmonary effort is normal. No respiratory distress.     Breath sounds: Normal breath sounds.  Abdominal:     General: Bowel sounds are normal. There is no distension.     Palpations: Abdomen is soft.  Tenderness: There is no abdominal tenderness.  Musculoskeletal:     Cervical back: Neck supple.     Right lower leg: No edema.     Left  lower leg: No edema.     Comments:  Has weakness in all extremities Bilateral grips are weak     Lymphadenopathy:     Cervical: No cervical adenopathy.  Skin:    General: Skin is warm and dry.  Neurological:     Mental Status: She is alert and oriented to person, place, and time.  Psychiatric:        Mood and Affect: Mood normal.     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

## 2021-09-05 DIAGNOSIS — M6281 Muscle weakness (generalized): Secondary | ICD-10-CM | POA: Diagnosis not present

## 2021-09-05 DIAGNOSIS — G8252 Quadriplegia, C1-C4 incomplete: Secondary | ICD-10-CM | POA: Diagnosis not present

## 2021-09-05 DIAGNOSIS — R279 Unspecified lack of coordination: Secondary | ICD-10-CM | POA: Diagnosis not present

## 2021-09-08 DIAGNOSIS — G8252 Quadriplegia, C1-C4 incomplete: Secondary | ICD-10-CM | POA: Diagnosis not present

## 2021-09-08 DIAGNOSIS — R279 Unspecified lack of coordination: Secondary | ICD-10-CM | POA: Diagnosis not present

## 2021-09-08 DIAGNOSIS — M6281 Muscle weakness (generalized): Secondary | ICD-10-CM | POA: Diagnosis not present

## 2021-09-09 ENCOUNTER — Encounter: Payer: Self-pay | Admitting: *Deleted

## 2021-09-09 ENCOUNTER — Ambulatory Visit (HOSPITAL_COMMUNITY)
Admission: RE | Admit: 2021-09-09 | Discharge: 2021-09-09 | Disposition: A | Discharge status: Home / Self Care | Payer: Medicare HMO | Source: Ambulatory Visit | Attending: Gastroenterology | Admitting: Gastroenterology

## 2021-09-09 ENCOUNTER — Telehealth: Payer: Self-pay | Admitting: *Deleted

## 2021-09-09 ENCOUNTER — Encounter: Payer: Self-pay | Admitting: Adult Health

## 2021-09-09 DIAGNOSIS — R932 Abnormal findings on diagnostic imaging of liver and biliary tract: Secondary | ICD-10-CM | POA: Insufficient documentation

## 2021-09-09 DIAGNOSIS — R279 Unspecified lack of coordination: Secondary | ICD-10-CM | POA: Diagnosis not present

## 2021-09-09 DIAGNOSIS — K7689 Other specified diseases of liver: Secondary | ICD-10-CM | POA: Diagnosis not present

## 2021-09-09 DIAGNOSIS — R053 Chronic cough: Secondary | ICD-10-CM | POA: Insufficient documentation

## 2021-09-09 DIAGNOSIS — G8252 Quadriplegia, C1-C4 incomplete: Secondary | ICD-10-CM | POA: Diagnosis not present

## 2021-09-09 DIAGNOSIS — M6281 Muscle weakness (generalized): Secondary | ICD-10-CM | POA: Diagnosis not present

## 2021-09-09 NOTE — Telephone Encounter (Signed)
PA approved via [employer]here. Auth# [id number], DOS: 09/22/2021 - 12/21/2021

## 2021-09-10 ENCOUNTER — Encounter: Payer: Self-pay | Admitting: *Deleted

## 2021-09-10 DIAGNOSIS — M6281 Muscle weakness (generalized): Secondary | ICD-10-CM | POA: Diagnosis not present

## 2021-09-10 DIAGNOSIS — G8252 Quadriplegia, C1-C4 incomplete: Secondary | ICD-10-CM | POA: Diagnosis not present

## 2021-09-10 DIAGNOSIS — R279 Unspecified lack of coordination: Secondary | ICD-10-CM | POA: Diagnosis not present

## 2021-09-11 DIAGNOSIS — Z1159 Encounter for screening for other viral diseases: Secondary | ICD-10-CM | POA: Diagnosis not present

## 2021-09-11 DIAGNOSIS — G8252 Quadriplegia, C1-C4 incomplete: Secondary | ICD-10-CM | POA: Diagnosis not present

## 2021-09-11 DIAGNOSIS — M6281 Muscle weakness (generalized): Secondary | ICD-10-CM | POA: Diagnosis not present

## 2021-09-11 DIAGNOSIS — R279 Unspecified lack of coordination: Secondary | ICD-10-CM | POA: Diagnosis not present

## 2021-09-12 DIAGNOSIS — M6281 Muscle weakness (generalized): Secondary | ICD-10-CM | POA: Diagnosis not present

## 2021-09-12 DIAGNOSIS — G8252 Quadriplegia, C1-C4 incomplete: Secondary | ICD-10-CM | POA: Diagnosis not present

## 2021-09-12 DIAGNOSIS — R279 Unspecified lack of coordination: Secondary | ICD-10-CM | POA: Diagnosis not present

## 2021-09-14 ENCOUNTER — Inpatient Hospital Stay
Admission: RE | Admit: 2021-09-14 | Discharge: 2021-09-22 | Disposition: A | Discharge status: Other Acute Inpatient Hospital | Payer: Medicare HMO | Source: Ambulatory Visit | Attending: Internal Medicine | Admitting: Internal Medicine

## 2021-09-14 ENCOUNTER — Encounter (HOSPITAL_COMMUNITY): Payer: Self-pay | Admitting: *Deleted

## 2021-09-14 ENCOUNTER — Emergency Department (HOSPITAL_COMMUNITY): Payer: Medicare HMO

## 2021-09-14 ENCOUNTER — Other Ambulatory Visit: Payer: Self-pay

## 2021-09-14 ENCOUNTER — Emergency Department (HOSPITAL_COMMUNITY)
Admission: EM | Admit: 2021-09-14 | Discharge: 2021-09-14 | Disposition: A | Discharge status: Home / Self Care | Payer: Medicare HMO | Attending: Emergency Medicine | Admitting: Emergency Medicine

## 2021-09-14 DIAGNOSIS — I6381 Other cerebral infarction due to occlusion or stenosis of small artery: Secondary | ICD-10-CM | POA: Diagnosis not present

## 2021-09-14 DIAGNOSIS — R9431 Abnormal electrocardiogram [ECG] [EKG]: Secondary | ICD-10-CM | POA: Diagnosis not present

## 2021-09-14 DIAGNOSIS — E860 Dehydration: Secondary | ICD-10-CM | POA: Insufficient documentation

## 2021-09-14 DIAGNOSIS — G40909 Epilepsy, unspecified, not intractable, without status epilepticus: Secondary | ICD-10-CM | POA: Diagnosis not present

## 2021-09-14 DIAGNOSIS — E875 Hyperkalemia: Secondary | ICD-10-CM | POA: Diagnosis not present

## 2021-09-14 DIAGNOSIS — R569 Unspecified convulsions: Secondary | ICD-10-CM | POA: Insufficient documentation

## 2021-09-14 DIAGNOSIS — R4182 Altered mental status, unspecified: Secondary | ICD-10-CM | POA: Diagnosis not present

## 2021-09-14 DIAGNOSIS — Z7982 Long term (current) use of aspirin: Secondary | ICD-10-CM | POA: Diagnosis not present

## 2021-09-14 DIAGNOSIS — Z794 Long term (current) use of insulin: Secondary | ICD-10-CM | POA: Diagnosis not present

## 2021-09-14 DIAGNOSIS — R8290 Unspecified abnormal findings in urine: Secondary | ICD-10-CM | POA: Diagnosis not present

## 2021-09-14 HISTORY — DX: Central cord syndrome at C4 level of cervical spinal cord, subsequent encounter: S14.124D

## 2021-09-14 HISTORY — DX: Radiculopathy, site unspecified: M54.10

## 2021-09-14 HISTORY — DX: Atherosclerosis of aorta: I70.0

## 2021-09-14 HISTORY — DX: Anxiety disorder, unspecified: F41.9

## 2021-09-14 HISTORY — DX: Dysphagia, unspecified: R13.10

## 2021-09-14 HISTORY — DX: Major depressive disorder, single episode, unspecified: F32.9

## 2021-09-14 HISTORY — DX: Gout, unspecified: M10.9

## 2021-09-14 HISTORY — DX: Restless legs syndrome: G25.81

## 2021-09-14 LAB — CBC WITH DIFFERENTIAL/PLATELET
Abs Immature Granulocytes: 0.02 10*3/uL (ref 0.00–0.07)
Basophils Absolute: 0 10*3/uL (ref 0.0–0.1)
Basophils Relative: 1 %
Eosinophils Absolute: 0.9 10*3/uL — ABNORMAL HIGH (ref 0.0–0.5)
Eosinophils Relative: 15 %
HCT: 27.1 % — ABNORMAL LOW (ref 36.0–46.0)
Hemoglobin: 8.9 g/dL — ABNORMAL LOW (ref 12.0–15.0)
Immature Granulocytes: 0 %
Lymphocytes Relative: 44 %
Lymphs Abs: 2.7 10*3/uL (ref 0.7–4.0)
MCH: 31.6 pg (ref 26.0–34.0)
MCHC: 32.8 g/dL (ref 30.0–36.0)
MCV: 96.1 fL (ref 80.0–100.0)
Monocytes Absolute: 0.5 10*3/uL (ref 0.1–1.0)
Monocytes Relative: 8 %
Neutro Abs: 2 10*3/uL (ref 1.7–7.7)
Neutrophils Relative %: 32 %
Platelets: 217 10*3/uL (ref 150–400)
RBC: 2.82 MIL/uL — ABNORMAL LOW (ref 3.87–5.11)
RDW: 15.9 % — ABNORMAL HIGH (ref 11.5–15.5)
WBC: 6.2 10*3/uL (ref 4.0–10.5)
nRBC: 0 % (ref 0.0–0.2)

## 2021-09-14 LAB — BASIC METABOLIC PANEL
Anion gap: 7 (ref 5–15)
BUN: 58 mg/dL — ABNORMAL HIGH (ref 8–23)
CO2: 17 mmol/L — ABNORMAL LOW (ref 22–32)
Calcium: 8.7 mg/dL — ABNORMAL LOW (ref 8.9–10.3)
Chloride: 110 mmol/L (ref 98–111)
Creatinine, Ser: 1.69 mg/dL — ABNORMAL HIGH (ref 0.44–1.00)
GFR, Estimated: 30 mL/min — ABNORMAL LOW (ref >60)
Glucose, Bld: 171 mg/dL — ABNORMAL HIGH (ref 70–99)
Potassium: 5.8 mmol/L — ABNORMAL HIGH (ref 3.5–5.1)
Sodium: 134 mmol/L — ABNORMAL LOW (ref 135–145)

## 2021-09-14 LAB — POTASSIUM: Potassium: 5.7 mmol/L — ABNORMAL HIGH (ref 3.5–5.1)

## 2021-09-14 LAB — URINALYSIS, ROUTINE W REFLEX MICROSCOPIC
Bilirubin Urine: NEGATIVE
Glucose, UA: NEGATIVE mg/dL
Hgb urine dipstick: NEGATIVE
Ketones, ur: NEGATIVE mg/dL
Nitrite: POSITIVE — AB
Protein, ur: NEGATIVE mg/dL
Specific Gravity, Urine: 1.01 (ref 1.005–1.030)
pH: 5.5 (ref 5.0–8.0)

## 2021-09-14 LAB — URINALYSIS, MICROSCOPIC (REFLEX)

## 2021-09-14 MED ORDER — SODIUM ZIRCONIUM CYCLOSILICATE 5 G PO PACK
10.0000 g | PACK | Freq: Once | ORAL | Status: AC
  Administered 2021-09-14: 10 g via ORAL
  Filled 2021-09-14: qty 2

## 2021-09-14 MED ORDER — SODIUM CHLORIDE 0.9 % IV BOLUS
1000.0000 mL | Freq: Once | INTRAVENOUS | Status: AC
  Administered 2021-09-14: 1000 mL via INTRAVENOUS

## 2021-09-14 NOTE — ED Provider Notes (Signed)
[facility] Provider Note   CSN: [id number] Arrival date & time: 09/14/21  1601     History  Chief Complaint  Patient presents with   Seizures    [name] is a 86 y.o. female.  She has a history of cervical spine injury and quadriparesis.  She was admitted in November for seizure.  Today she had another seizure-like episode with unresponsiveness that lasted for approximately 8 to 10 minutes.  Patient has no recollection of the episode.  She denies any headache chest pain cough abdominal pain vomiting diarrhea.  She has a chronic Foley.  She has baseline weakness in her legs is nonambulatory.  Limited use of left arm.  Some use of right arm.  The history is provided by the patient and a caregiver.  Seizures Seizure activity on arrival: no   Seizure type:  Unable to specify Initial focality:  Unable to specify Episode characteristics: unresponsiveness   Return to baseline: yes   Duration:  8 minutes Timing:  Once Number of seizures this episode:  1 Progression:  Resolved Recent head injury:  No recent head injuries PTA treatment:  None History of seizures: yes   Current therapy:  Levetiracetam Compliance with current therapy:  Good     Home Medications Prior to Admission medications   Medication Sig Start Date End Date Taking? Authorizing Provider  acetaminophen (TYLENOL) 325 MG tablet Take 650 mg by mouth every 8 (eight) hours.    [provider]  albuterol (VENTOLIN HFA) 108 (90 Base) MCG/ACT inhaler Inhale 1 puff into the lungs every 4 (four) hours as needed for wheezing or shortness of breath.    [provider]  AMBULATORY NON FORMULARY MEDICATION Medication Name:  Continue monthly catheter changes with 5fr catheter at skilled nursing facility. Patient taking differently: Medication Name:  Continue monthly catheter changes with 23fr catheter at skilled nursing facility. 08/12/21   McKenzie, Candee Furbish, MD  Artificial Saliva  (BIOTENE MOISTURIZING MOUTH MT) Use as directed 1 application in the mouth or throat at bedtime. 9 pm    [provider]  aspirin EC 81 MG tablet Take 81 mg by mouth daily. Swallow whole.9 am    [provider]  atorvastatin (LIPITOR) 20 MG tablet Take 1 tablet (20 mg total) by mouth every evening. 12/20/20   Nyoka Cowden, Phylis Bougie, NP  Roseanne Kaufman [place]-Castor Oil [facility]) OINT Apply topically. Apply to sacrum, coccyx, bilateral buttocks qshift for prevention.    [provider]  chlorthalidone (HYGROTON) 25 MG tablet Take 25 mg by mouth daily. 01/01/21   [provider]  colchicine 0.6 MG tablet Take 0.6 mg by mouth daily as needed.    [provider]  DULoxetine (CYMBALTA) 60 MG capsule Take 60 mg by mouth daily.    [provider]  gabapentin (NEURONTIN) 100 MG capsule Take 1 capsule (100 mg total) by mouth 2 (two) times daily. 07/09/21 09/07/21  Barton Dubois, MD  guaiFENesin (MUCINEX) 600 MG 12 hr tablet Take by mouth 2 (two) times daily.    [provider]  insulin glargine (LANTUS) 100 UNIT/ML injection Inject 20 Units into the skin at bedtime.    [provider]  insulin lispro (HUMALOG) 100 UNIT/ML KwikPen Inject 5 Units into the skin 2 (two) times daily with a meal.    [provider]  levETIRAcetam (KEPPRA) 750 MG tablet Take 750 mg by mouth 2 (two) times daily.    [provider]  melatonin 3 MG  TABS tablet Take 3 mg by mouth at bedtime.    [provider]  metoCLOPramide (REGLAN) 5 MG tablet Take 2.5 mg by mouth 4 (four) times daily.    [provider]  mirtazapine (REMERON) 15 MG tablet Take 15 mg by mouth at bedtime.    [provider]  NON FORMULARY Diet - NAS, Cons CHO    [provider]  omeprazole (PRILOSEC) 40 MG capsule Take 40 mg by mouth daily.    [provider]  rOPINIRole (REQUIP) 1 MG tablet Take 1 mg by mouth at bedtime.    [provider]   tiZANidine (ZANAFLEX) 2 MG tablet Take 2 mg by mouth every 6 (six) hours as needed for muscle spasms (for back and sciatic pain).    [provider]      Allergies    Ace inhibitors, Codeine, and Sulfa antibiotics    Review of Systems   Review of Systems  Constitutional:  Negative for fever.  HENT:  Negative for sore throat.   Eyes:  Negative for visual disturbance.  Respiratory:  Negative for shortness of breath.   Cardiovascular:  Negative for chest pain.  Gastrointestinal:  Negative for abdominal pain.  Genitourinary:  Negative for dysuria.  Musculoskeletal:  Positive for gait problem.  Skin:  Negative for rash.  Neurological:  Positive for seizures.   Physical Exam Updated Vital Signs BP (!) 136/47    Pulse 78    Temp 98.2 F (36.8 C) (Oral)    Resp 19    Ht 5\' 2"  (1.575 m)    Wt 74.3 kg    SpO2 98%    BMI 29.98 kg/m  Physical Exam Vitals and nursing note reviewed.  Constitutional:      General: She is not in acute distress.    Appearance: Normal appearance. She is well-developed.  HENT:     Head: Normocephalic and atraumatic.  Eyes:     Conjunctiva/sclera: Conjunctivae normal.  Cardiovascular:     Rate and Rhythm: Normal rate and regular rhythm.     Heart sounds: No murmur heard. Pulmonary:     Effort: Pulmonary effort is normal. No respiratory distress.     Breath sounds: Normal breath sounds.  Abdominal:     Palpations: Abdomen is soft.     Tenderness: There is no abdominal tenderness. There is no guarding or rebound.  Genitourinary:    Comments: Foley catheter Musculoskeletal:        General: No swelling.     Cervical back: Neck supple.  Skin:    General: Skin is warm and dry.     Capillary Refill: Capillary refill takes less than 2 seconds.  Neurological:     Mental Status: She is alert. Mental status is at baseline.     Comments: She is awake and alert.  No obvious facial asymmetry.  No slurred speech.  Limited use left arm and bilateral lower  extremities.  Some use of right arm.  Psychiatric:        Mood and Affect: Mood normal.    ED Results / Procedures / Treatments   Labs (all labs ordered are listed, but only abnormal results are displayed) Labs Reviewed  BASIC METABOLIC PANEL - Abnormal; Notable for the following components:      Result Value   Sodium 134 (*)    Potassium 5.8 (*)    CO2 17 (*)    Glucose, Bld 171 (*)    BUN 58 (*)  Creatinine, Ser 1.69 (*)    Calcium 8.7 (*)    GFR, Estimated 30 (*)    All other components within normal limits  CBC WITH DIFFERENTIAL/PLATELET - Abnormal; Notable for the following components:   RBC 2.82 (*)    Hemoglobin 8.9 (*)    HCT 27.1 (*)    RDW 15.9 (*)    Eosinophils Absolute 0.9 (*)    All other components within normal limits  URINALYSIS, ROUTINE W REFLEX MICROSCOPIC - Abnormal; Notable for the following components:   Nitrite POSITIVE (*)    Leukocytes,Ua MODERATE (*)    All other components within normal limits  URINALYSIS, MICROSCOPIC (REFLEX) - Abnormal; Notable for the following components:   Bacteria, UA FEW (*)    All other components within normal limits  POTASSIUM - Abnormal; Notable for the following components:   Potassium 5.7 (*)    All other components within normal limits  URINE CULTURE    EKG EKG Interpretation  Date/Time:  Sunday September 14 2021 18:24:44 EST Ventricular Rate:  71 PR Interval:  149 QRS Duration: 101 QT Interval:  396 QTC Calculation: 431 R Axis:   53 Text Interpretation: Sinus rhythm No significant change since prior 11/22 Confirmed by Aletta Edouard [phone]) on 09/14/2021 6:46:17 PM  Radiology CT Head Wo Contrast  Result Date: 09/14/2021 CLINICAL DATA:  Seizure today.  Altered mental status. EXAM: CT HEAD WITHOUT CONTRAST TECHNIQUE: Contiguous axial images were obtained from the base of the skull through the vertex without intravenous contrast. RADIATION DOSE REDUCTION: This exam was performed according to the  departmental dose-optimization program which includes automated exposure control, adjustment of the mA and/or kV according to patient size and/or use of iterative reconstruction technique. COMPARISON:  07/06/2021 FINDINGS: Brain: No evidence of acute infarction, hemorrhage, hydrocephalus, extra-axial collection, or mass lesion/mass effect. Old lacunar infarct again seen in the left cerebellar hemisphere. Vascular:  No hyperdense vessel or other acute findings. Skull: No evidence of fracture or other significant bone abnormality. Sinuses/Orbits: Mucous retention cysts or polyps are again seen in both maxillary sinuses. Left sphenoid sinus air-fluid level is new since previous study. Other: None. IMPRESSION: No acute intracranial abnormality. 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 New left sphenoid sinus air-fluid level, which may be seen with acute sinusitis. Electronically Signed   By: Marlaine Hind M.D.   On: 09/14/2021 17:01    Procedures Procedures    Medications Ordered in ED Medications  sodium chloride 0.9 % bolus 1,000 mL (0 mLs Intravenous Stopped 09/14/21 1950)  sodium zirconium cyclosilicate (LOKELMA) packet 10 g (10 g Oral Given 09/14/21 1957)    ED Course/ Medical Decision Making/ A&P                           Medical Decision Making  This patient complains of seizure-like activity; this involves an extensive number of treatment Options and is a complaint that carries with it a high risk of complications and Morbidity. The differential includes seizure, arrhythmia, infection, dehydration, metabolic derangement  I ordered, reviewed and interpreted labs, which included CBC with normal white count, hemoglobin low stable from priors, chemistries with elevated potassium similar to priors, elevated BUN and creatinine, urinalysis with possible signs of infection in the setting of a chronic Foley I ordered medication IV fluids oral Lokelma for elevated potassium.  EKG does not show any signs of  hyperkalemia. I ordered imaging studies which included head CT and I independently    visualized  and interpreted imaging which showed no acute findings Additional history obtained from patient's companion Previous records obtained and reviewed in epic, does have prior history of seizure disorder  After the interventions stated above, I reevaluated the patient and found patient to be seizure-free here and at baseline.  Will be returned back to her facility where they can continue to observe her.  No indications for admission at this time.         Final Clinical Impression(s) / ED Diagnoses Final diagnoses:  Seizure ([place])  Hyperkalemia  Dehydration    Rx / DC Orders ED Discharge Orders     None         Hayden Rasmussen, MD 09/15/21 [phone]

## 2021-09-14 NOTE — ED Notes (Signed)
IV and monitor removed. Pt transferred to [facility] stretcher. Wheeled out by [facility] staff

## 2021-09-14 NOTE — ED Triage Notes (Signed)
Pt brought to ED from [facility] d/t seizure today for 8-10 minutes despite being on seizure medicine twice daily. Nursing staff reports pt was unresponsive, but not postictal. Pt arrives to ED alert and oriented x 3. Pt says she does not remember having a seizure, only "seeing clouds".

## 2021-09-14 NOTE — Discharge Instructions (Signed)
You were seen in the emergency department for episode of seizure-like activity.  You had lab work done and your potassium was mildly elevated.  You were given fluids and medication to help with the potassium.  You had a CAT scan done that did not show any acute findings.  It will be important for you to continue your seizure medications and follow-up with neurology.  You will also need to have your electrolytes rechecked.

## 2021-09-15 ENCOUNTER — Other Ambulatory Visit (HOSPITAL_COMMUNITY)
Admission: RE | Admit: 2021-09-15 | Discharge: 2021-09-15 | Disposition: A | Discharge status: Home / Self Care | Payer: Medicare HMO | Source: Skilled Nursing Facility | Attending: Adult Health | Admitting: Adult Health

## 2021-09-15 ENCOUNTER — Encounter: Payer: Self-pay | Admitting: Adult Health

## 2021-09-15 ENCOUNTER — Non-Acute Institutional Stay (SKILLED_NURSING_FACILITY): Payer: Medicare HMO | Admitting: Adult Health

## 2021-09-15 DIAGNOSIS — E875 Hyperkalemia: Secondary | ICD-10-CM | POA: Diagnosis not present

## 2021-09-15 DIAGNOSIS — R569 Unspecified convulsions: Secondary | ICD-10-CM | POA: Diagnosis not present

## 2021-09-15 LAB — POTASSIUM: Potassium: 6.3 mmol/L (ref 3.5–5.1)

## 2021-09-15 NOTE — Progress Notes (Signed)
Location:  [place] Room Number: 154-W Place of Service:  SNF (31)   CODE STATUS: DNR  Allergies  Allergen Reactions   Ace Inhibitors Other (See Comments)    Hyperkalemia    Codeine    Sulfa Antibiotics     Chief Complaint  Patient presents with   Acute Visit    Follow-up ED    HPI:  She had a seizure yesterday which lasted approximately 8 minutes. She was taken to the ED for furthe work up. Her seizure did resolve. Her CT of head was negative for acute findings. Her k+ was elevated at 5.8. she was given lokelma 10 gm. She was returned to the facility. Today she tells me that she feels tired. There are no reports of further seizure activity. No changes in appetite.    Past Medical History:  Diagnosis Date   Anemia    Anxiety    Atherosclerosis of aorta (HCC)    Central cord syndrome at C4 level of cervical spinal cord, subsequent encounter ([place])    CKD (chronic kidney disease)    stage 3   Depression    DM type 2 with diabetic peripheral neuropathy (HCC)    Dysphagia    GERD (gastroesophageal reflux disease)    Gout    High cholesterol    HTN (hypertension)    Hyponatremia    MDD (major depressive disorder)    Neck pain    Neuropathy    Quadriplegia, C1-C4 incomplete (HCC)    Radiculopathy    RLS (restless legs syndrome)    Urinary retention     Past Surgical History:  Procedure Laterality Date   ABDOMINAL HYSTERECTOMY     ANTERIOR CERVICAL DECOMP/DISCECTOMY FUSION N/A 02/05/2021   Procedure: Cervical Three-Four  Anterior cervical decompression/discectomy/fusion;  Surgeon: Ashok Pall, MD;  Location: [place] [facility];  Service: Neurosurgery;  Laterality: N/A;  RM 20   APPENDECTOMY     BACK SURGERY     CERVICAL DISC SURGERY     HAND SURGERY     KNEE SURGERY      Social History   Socioeconomic History   Marital status: Widowed    Spouse name: Not on file   Number of children: Not on file   Years of education: Not on file   Highest  education level: Not on file  Occupational History   Not on file  Tobacco Use   Smoking status: Never   Smokeless tobacco: Never  Vaping Use   Vaping Use: Never used  Substance and Sexual Activity   Alcohol use: Never   Drug use: Never   Sexual activity: Never  Other Topics Concern   Not on file  Social History Narrative   Not on file   Social Determinants of Health   Financial Resource Strain: Not on file  Food Insecurity: Not on file  Transportation Needs: Not on file  Physical Activity: Not on file  Stress: Not on file  Social Connections: Not on file  Intimate Partner Violence: Not on file   Family History  Problem Relation Age of Onset   Cancer Mother    Cardiomyopathy Father    Colon cancer Neg Hx       VITAL SIGNS BP 135/69    Pulse 83    Temp (!) 97 F (36.1 C)    Resp 18    Ht 5\' 2"  (1.575 m)    Wt 163 lb 14.4 oz (74.3 kg)    SpO2 95%  BMI 29.98 kg/m   Outpatient Encounter Medications as of 09/15/2021  Medication Sig   acetaminophen (TYLENOL) 325 MG tablet Take 650 mg by mouth every 8 (eight) hours.   albuterol (VENTOLIN HFA) 108 (90 Base) MCG/ACT inhaler Inhale 1 puff into the lungs every 4 (four) hours as needed for wheezing or shortness of breath.   AMBULATORY NON FORMULARY MEDICATION Medication Name:  Continue monthly catheter changes with 13fr catheter at skilled nursing facility. (Patient taking differently: Medication Name:  Continue monthly catheter changes with 67fr catheter at skilled nursing facility.)   Artificial Saliva (BIOTENE MOISTURIZING MOUTH MT) Use as directed 1 application in the mouth or throat at bedtime. 9 pm   aspirin EC 81 MG tablet Take 81 mg by mouth daily. Swallow whole.9 am   atorvastatin (LIPITOR) 20 MG tablet Take 1 tablet (20 mg total) by mouth every evening.   Balsam [place]-Castor Oil (VENELEX) OINT Apply topically. Apply to sacrum, coccyx, bilateral buttocks qshift for prevention.   BENZONATATE PO Take 100 mg by mouth  daily as needed (cough).   chlorthalidone (HYGROTON) 25 MG tablet Take 25 mg by mouth daily.   colchicine 0.6 MG tablet Take 0.6 mg by mouth daily as needed.   DULoxetine (CYMBALTA) 60 MG capsule Take 60 mg by mouth daily.   guaiFENesin (MUCINEX) 600 MG 12 hr tablet Take by mouth 2 (two) times daily.   insulin lispro (HUMALOG) 100 UNIT/ML KwikPen Inject 5 Units into the skin 2 (two) times daily with a meal.   LANTUS SOLOSTAR 100 UNIT/ML Solostar Pen Inject 15 Units into the skin at bedtime.   levETIRAcetam (KEPPRA) 750 MG tablet Take 750 mg by mouth 2 (two) times daily.   melatonin 3 MG TABS tablet Take 3 mg by mouth at bedtime.   metoCLOPramide (REGLAN) 5 MG tablet Take 5 mg by mouth 4 (four) times daily.   mirtazapine (REMERON) 15 MG tablet Take 15 mg by mouth at bedtime.   NON FORMULARY Diet - NAS, Cons CHO   omeprazole (PRILOSEC) 40 MG capsule Take 40 mg by mouth daily.   rOPINIRole (REQUIP) 1 MG tablet Take 1 mg by mouth at bedtime.   tiZANidine (ZANAFLEX) 2 MG tablet Take 2 mg by mouth every 6 (six) hours as needed for muscle spasms (for back and sciatic pain).   gabapentin (NEURONTIN) 100 MG capsule Take 1 capsule (100 mg total) by mouth 2 (two) times daily.   No facility-administered encounter medications on file as of 09/15/2021.     SIGNIFICANT DIAGNOSTIC EXAMS  PREVIOUS   12-02-20; ct of head: No acute intracranial abnormality. Polypoid sinus disease.  12-02-20: ct of cervical spine:  Postoperative and degenerative changes in the cervical spine. No acute bony abnormality.  12-02-20; ct of pelvis:  1. No evidence of acute fracture or dislocation of the pelvis or hips. 2. 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 3. Degenerative changes in the hips with subcortical cysts on the left hip.  12-02-20: ct of left knee:  1. Subtle acute nondisplaced fracture through the medial aspect of the head of the fibula. 2. Tricompartmental  osteoarthritis with intra-articular loose bodies. 3. There is a small joint effusion.  12-02-20: mri left hip:  1. No acute findings involving the left hip. 2. Mild chronic bilateral hip avascular necrosis. Moderate degenerative hip findings bilaterally. No hip effusion or regional bursitis. 3. Mild left greater than right hamstring tendinopathy. 4. 1.3 cm left eccentric Bartholin's cyst or Gartner cyst.  12-02-20: mri lumbar spine:  1. 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 2. L3-4: Bilateral posterolateral disc bulges, more prominent on the left. Mild left foraminal encroachment that could possibly affect the left L3 nerve. Moderate to severe bilateral facet osteoarthritis. These findings could relate to back pain or referred facet syndrome pain. 3. L4-5: Bilateral facet arthropathy with 2 mm of anterolisthesis. Bulging of the disc more prominent towards the right. Mild narrowing of the lateral recesses and of the intervertebral foramen on the right, but without visible neural compression. Findings at this level could relate to back pain or referred facet syndrome pain. 4. L5-S1: Previous left hemilaminectomy. Endplate osteophytes and bulging of the disc more prominent towards the left. Facet degeneration and hypertrophy left more than right with left foraminal stenosis and subarticular lateral recess stenosis could cause left-sided neural compression.   01-25-21: ct of head; maxillofacial cervical spine:  1. No acute intracranial pathology. 2. No acute/traumatic cervical spine pathology. 3. No acute facial bone fractures.  01-26-21: ct of chest abdomen and pelvis:  1. No noncontrast CT evidence of acute traumatic injury to the chest, abdomen, or pelvis. 2. Distended urinary bladder. Mild bilateral hydronephrosis and hydroureter to the bilateral ureterovesicular junctions. No obstructing calculus or other etiology identified. Correlate for urinary retention. 3. No fracture or  dislocation of the lumbar spine. Moderate disc space height loss and osteophytosis at L5-S1. Probable small broad-based posterior disc bulges at L4-L5 and L5-S1. 4. Coronary artery disease. Aortic Atherosclerosis  01-26-21: ct of right shoulder:  1.  No acute osseous injury of the right shoulder. 2. Mild mineralization in the infraspinatus tendon as can be seen with calcific tendinosis.  01-29-21: MRI of spine:  Postoperative and multilevel degenerative changes of the cervical spine. There is severe canal stenosis with cord compression at C3-C4. Abnormal cord signal is present just below this level (at operative levels) and may reflect edema or myelomalacia No significant degenerative changes of the thoracic spine. Multilevel degenerative changes of lumbar spine similar to recent prior study.  01-30-21: renal ultrasound:  1. No acute abnormality. Small amount of right perinephric fluid, nonspecific.  02-05-21: chest x-ray:  1. Right internal jugular central venous catheter with tip over the mid SVC. No pneumothorax. 2. Hazy lung base opacities likely represent combination of atelectasis and pleural fluid. Vascular congestion.   03-06-21 chest x-ray: left lower lobe infiltrate likely representing pneumonia   03-26-21: lumbar x-ray: mild to moderate degenerative changes in lumbar spine  03-27-21; DEXA scan: t score -1.777  06-22-21: ct of abdomen and pelvis:  Left colonic diverticulosis.  No active diverticulitis. Bilateral hydronephrosis to the level of the bladder. No obstructing stones. Urinary bladder is distended with Foley catheter in place. Aortic atherosclerosis.  07-06-21: ct head:  No intracranial abnormality or other acute findings. Chronic bilateral maxillary sinus disease.  07-06-21: ct of abdomen and pelvis:  1. Heterogeneous enhancement of the left kidney with mild left perinephric edema, suspicious for pyelonephritis. There is also mild bladder wall thickening and perivesicular  fat stranding with Foley catheter in place. Improvement in bilateral hydronephrosis from prior exam. 2. Stool distends the rectum at 7.6 cm with mild distal rectal wall thickening, suspicious for fecal impaction. 3. Colonic diverticulosis without diverticulitis. Aortic Atherosclerosis   07-07-21: MRI of head:  No acute intracranial finding. No sign of acute infarction. No lesion seen to explain seizure. The patient does have mild chronic small-vessel ischemic changes considering age affecting the pons, thalami and cerebral hemispheric white matter.  Left mastoid effusion  TODAY  09-14-21: ct of head:  No acute intracranial abnormality. 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 New left sphenoid sinus air-fluid level, which may be seen with acute sinusitis.      LABS REVIEWED PREVIOUS   12-02-20: wbc 6.4; hgb 11.3; hct 34.4; mcv 93.0 plt 216; glucose 209; bun 33; creat 1.13; k+ 4.3; na++ 137; ca 9.0; GFR 49; hgb a1c 8.6 12-03-20: wbc 6.5; hgb 9.8; hct 30.5 mcv 93.8 plt 182; glucose 188;bun 41; creat 1.49; k+ 4.4; na++ 134; ca 8.2; GFR 35 12-04-20; glucose 285; bun 29; creat 1.00; k+ 4.3; na++ 138; ca 8.7 GFR 56  01-25-21: wbc 6.4; hgb 10.6; hct 31.7; mcv 91.1 plt 227; glucose 152; bun 40; creat 1.2 k+ 4.2; na++ 135; ca 8.7; GFR 42; liver normal albumin 3.5 01-29-21: urine and blood culture: klebsiella pneumoniae 01-31-21: wbc 13.1; hgb 8.3; hct 23.6; mcv 88.1 plt 153; glucose 162; bun 67; creat 2.77; k+ 4.8; na++ 125; ca 7.5; GFR 17; ast 62; alt 57; albumin 1.7 02-04-21: wbc 9.8; hgb 7.6; hct 22.0; mcv 85.3 plt 212; glucose 161 bun 40; creat 1.40; k+ 3.4; na++ 127; ca 7.5; GFR 38; ast 62; alt 57; albumin 1.7  02-08-21: wbc 10.4; hgb 9.2; hct 27.6; mcv 87.3 plt 433; glucose 186; bun 23; creat 1.00 ;k+ 4.4; na++ 131; ca 8.1; GFR 56; ast 60; alt 61; albumin 2.1  02-20-21: hgb 9.4; hct 29.9 glucose 154; bun 51; creat 1.17; k+ 4.5; na++ 136; ca 8.9; GFR 47 02-24-21: glucose 175; bun 62; creat 1.13; k+ 4.5; na++ 133;  ca 9.0 GFR 49 03-06-21: wbc 5.3; hgb 8.8; hct 27.1; mcv 93.4 plt 262; glucose 453; bun 66; creat 1.30; k+ 4.6; na++ 135; ca 9.0; GFR 41; d-dimer: 2.16; CRP 3.0  03-13-21: d-dimer: 1.49 03-20-21: hep C nr; d-dimer 0.86 03-27-21: d-dimer 0.52 03-31-21: wbc 5.5; hgb 8.5; hct 26.8; mcv 93.4 plt 203; hgb a1c 7.6; chol 130; ldl 68; trig 138; hdl 34; urine micro-albumin 25.1 06-12-21: uric acid 5.9 06-15-21: wbc 6.0; hgb 9.8; hct 29.4; mcv 90,7 plt 242; glucose 187; bun 65; creat 1.33; k+ 4.2; na++ 131; ca 8.9; GFR 40; liver normal albumin 3.3  06-19-21: wbc 7.3; hgb 9.9; hct 30.1; mcv 89,9 plt 306; glucose 115; bun 29; creat 1.05; k+ 4.1; na++ 131; ca 8.7; GFR 53; liver normal albumin 3.1; urine micro-albumin 85.3 06-22-21; wbc 11.8; hgb 10.4; hct 30.9; mcv 89,3 plt 375; glucose 288; bun 44; creat 1.46; k+ 3.5; na++ 125; ca 8.3 GFR 36; liver normal albumin 3.3; lipase 37  07-06-21: wbc 7.6; hgb 9.7; hct 29.8; mcv 89,2 plt 409; glucose 92; bun 44; creat 1.09; k+ 5,3l na++ 136; ca 9.4; GFR 51; liver normal albumin 3.4  Hgb A1C: 8.3; urine culture: proteus mirabilis; + 30,000 colonies klebsiella pneumoniae  08-29-21: wbc 5.6; hgb 9.0; hct 27.6; mcv 94.2 plt 244; glucose 106; bun 80; creat 1.35; k+ 5.8; na++ 134; ca 9.0; GFR 39 liver normal albumin 3.5 vit B 12: 446; folate 7.7; iron 79 tibc 280; ferritin 178  TODAY  09-04-21: glucose 66; bun 79; creat 1.44; k+ 5.5; na++ 135; ca 9.2; GFR 36 09-14-21: wbc 6.2; hgb 8.9; hct 27.1; mcv 96.1 plt 217; glucose 171; bun 58; creat 1.69; k+ 5.8; na++ 134; ca 8.7; GFR 30; urine culture: multiple species 09-15-20: k+ 6.3   Review of Systems  Constitutional:  Negative for malaise/fatigue.  Respiratory:  Negative for cough and shortness of breath.   Cardiovascular:  Negative for chest pain, palpitations and leg swelling.  Gastrointestinal:  Negative for abdominal pain, constipation and heartburn.  Musculoskeletal:  Negative for back pain, joint pain and myalgias.  Skin:  Negative.   Neurological:  Negative for dizziness.  Psychiatric/Behavioral:  The patient is not nervous/anxious.     Physical Exam Constitutional:      General: She is not in acute distress.    Appearance: She is well-developed. She is not diaphoretic.  Neck:     Thyroid: No thyromegaly.  Cardiovascular:     Rate and Rhythm: Normal rate and regular rhythm.     Pulses: Normal pulses.     Heart sounds: Normal heart sounds.  Pulmonary:     Effort: Pulmonary effort is normal. No respiratory distress.     Breath sounds: Normal breath sounds.  Abdominal:     General: Bowel sounds are normal. There is no distension.     Palpations: Abdomen is soft.     Tenderness: There is no abdominal tenderness.  Musculoskeletal:     Cervical back: Neck supple.     Right lower leg: No edema.     Left lower leg: No edema.     Comments: Is able to move all extremities   Lymphadenopathy:     Cervical: No cervical adenopathy.  Skin:    General: Skin is warm and dry.  Neurological:     Mental Status: She is alert and oriented to person, place, and time.  Psychiatric:        Mood and Affect: Mood normal.      ASSESSMENT/ PLAN:  TODAY  Seizures Hyperkalemia:   Will repeat lokelma 10 gm today Will begin ativan 1 mg every 15 minutes up to 4 doses for seizures lasting more than 1 minutes Will repeat k+ in the AM.    [name] NP [facility]   call [phone]

## 2021-09-16 ENCOUNTER — Other Ambulatory Visit (HOSPITAL_COMMUNITY)
Admission: RE | Admit: 2021-09-16 | Discharge: 2021-09-16 | Disposition: A | Discharge status: Home / Self Care | Payer: Medicare HMO | Source: Skilled Nursing Facility | Attending: Adult Health | Admitting: Adult Health

## 2021-09-16 DIAGNOSIS — G8252 Quadriplegia, C1-C4 incomplete: Secondary | ICD-10-CM | POA: Diagnosis not present

## 2021-09-16 DIAGNOSIS — H524 Presbyopia: Secondary | ICD-10-CM | POA: Diagnosis not present

## 2021-09-16 DIAGNOSIS — R279 Unspecified lack of coordination: Secondary | ICD-10-CM | POA: Diagnosis not present

## 2021-09-16 DIAGNOSIS — E875 Hyperkalemia: Secondary | ICD-10-CM | POA: Diagnosis not present

## 2021-09-16 DIAGNOSIS — E109 Type 1 diabetes mellitus without complications: Secondary | ICD-10-CM | POA: Diagnosis not present

## 2021-09-16 DIAGNOSIS — E7800 Pure hypercholesterolemia, unspecified: Secondary | ICD-10-CM | POA: Diagnosis not present

## 2021-09-16 DIAGNOSIS — Z0100 Encounter for examination of eyes and vision without abnormal findings: Secondary | ICD-10-CM | POA: Diagnosis not present

## 2021-09-16 DIAGNOSIS — I10 Essential (primary) hypertension: Secondary | ICD-10-CM | POA: Diagnosis not present

## 2021-09-16 DIAGNOSIS — M6281 Muscle weakness (generalized): Secondary | ICD-10-CM | POA: Diagnosis not present

## 2021-09-16 LAB — URINE CULTURE

## 2021-09-16 LAB — POTASSIUM: Potassium: 5 mmol/L (ref 3.5–5.1)

## 2021-09-16 NOTE — Pre-Procedure Instructions (Signed)
Shaylyn Bawa procedure is on 09/22/2021. She needs to [facility] at 1100.  09/21/2021 Sunday: she may have regular food and drink until 1800 (6:00 pm).   Between 1800 and 12 midnight, only clear liquids, no solid foods.  Take  half dose of lantus at bed time. (10 units)     09/22/2021 Monday. No diabetic medications this morning. She may have clear liquids ONLY until 0900 and nothing after this. She can take her cymbalta, gabapentin, keppra, reglan, omeprazole,zanaflex(if needed), with as little water as she can safely swallow these. Please have her take these before 0900.   She may also use her inhaler before she comes.    No lotions, powders or colognes on her skin the morning of her surgery and no jewelery or valuables when she comes.    Clear liquids include (nothing red in color)       Water, Ice, teas(no milk or cream), coffee(no milk or cream), Juices include (apple, white grape, white cranberry), soft drinks(diet or regular), jello, popsicles, powdered fruit flavored drinks, gatorade, lemonade, [place].      If you have any questions, please call [phone]. Thank you.

## 2021-09-16 NOTE — Pre-Procedure Instructions (Signed)
Called staff at [facility] for pre-op. Faxed copy of instructions to them as well.

## 2021-09-17 ENCOUNTER — Telehealth: Payer: Self-pay | Admitting: *Deleted

## 2021-09-17 ENCOUNTER — Encounter: Payer: Self-pay | Admitting: Adult Health

## 2021-09-17 ENCOUNTER — Encounter (HOSPITAL_COMMUNITY): Payer: Medicare HMO | Attending: Internal Medicine

## 2021-09-17 DIAGNOSIS — G8252 Quadriplegia, C1-C4 incomplete: Secondary | ICD-10-CM | POA: Diagnosis not present

## 2021-09-17 DIAGNOSIS — R279 Unspecified lack of coordination: Secondary | ICD-10-CM | POA: Diagnosis not present

## 2021-09-17 DIAGNOSIS — M6281 Muscle weakness (generalized): Secondary | ICD-10-CM | POA: Diagnosis not present

## 2021-09-17 NOTE — Telephone Encounter (Signed)
Transition Care Management Unsuccessful Follow-up Telephone Call  Date of discharge and from where:  09/14/2021 from [facility] ER  Attempts:  1st Attempt  Reason for unsuccessful TCM follow-up call:  Unable to reach patient  [facility]

## 2021-09-18 DIAGNOSIS — G8252 Quadriplegia, C1-C4 incomplete: Secondary | ICD-10-CM | POA: Diagnosis not present

## 2021-09-18 DIAGNOSIS — R279 Unspecified lack of coordination: Secondary | ICD-10-CM | POA: Diagnosis not present

## 2021-09-18 DIAGNOSIS — Z1159 Encounter for screening for other viral diseases: Secondary | ICD-10-CM | POA: Diagnosis not present

## 2021-09-18 DIAGNOSIS — M6281 Muscle weakness (generalized): Secondary | ICD-10-CM | POA: Diagnosis not present

## 2021-09-19 ENCOUNTER — Encounter: Payer: Self-pay | Admitting: Adult Health

## 2021-09-19 ENCOUNTER — Non-Acute Institutional Stay (SKILLED_NURSING_FACILITY): Payer: Medicare HMO | Admitting: Adult Health

## 2021-09-19 DIAGNOSIS — G8252 Quadriplegia, C1-C4 incomplete: Secondary | ICD-10-CM | POA: Diagnosis not present

## 2021-09-19 DIAGNOSIS — M6281 Muscle weakness (generalized): Secondary | ICD-10-CM | POA: Diagnosis not present

## 2021-09-19 DIAGNOSIS — M87051 Idiopathic aseptic necrosis of right femur: Secondary | ICD-10-CM

## 2021-09-19 DIAGNOSIS — S14124S Central cord syndrome at C4 level of cervical spinal cord, sequela: Secondary | ICD-10-CM

## 2021-09-19 DIAGNOSIS — R279 Unspecified lack of coordination: Secondary | ICD-10-CM | POA: Diagnosis not present

## 2021-09-19 DIAGNOSIS — M87052 Idiopathic aseptic necrosis of left femur: Secondary | ICD-10-CM

## 2021-09-19 NOTE — Progress Notes (Signed)
Location:  [facility] Room Number: 154-W Place of Service:  SNF (31)   CODE STATUS: DNR  Allergies  Allergen Reactions   Ace Inhibitors Other (See Comments)    Hyperkalemia    Codeine    Sulfa Antibiotics     Chief Complaint  Patient presents with   Acute Visit    Care plan meeting    HPI:  We have come together for her care plan meeting. BIMS 15/15 mood 5/30: decreased energy; trouble concentrating anxious. She is nonambulatory; no falls. She is dependent for transfers and locomotion. She is dependent for her adl care. Incontinent of bowel; has foley. Dietary: NAS CONCHO diet good appetite feeds self. Weight is 163.9 pounds. Therapy none at this time. She continues to be followed for her chronic illnesses including:  Avascular necrosis of both hip  Quadriplegia of C1-C4 incomplete  Central cord syndrome C4 level of cervical spine sequela  Past Medical History:  Diagnosis Date   Anemia    Anxiety    Atherosclerosis of aorta (HCC)    Central cord syndrome at C4 level of cervical spinal cord, subsequent encounter ([place])    CKD (chronic kidney disease)    stage 3   Depression    DM type 2 with diabetic peripheral neuropathy (HCC)    Dysphagia    GERD (gastroesophageal reflux disease)    Gout    High cholesterol    HTN (hypertension)    Hyponatremia    MDD (major depressive disorder)    Neck pain    Neuropathy    Quadriplegia, C1-C4 incomplete (HCC)    Radiculopathy    RLS (restless legs syndrome)    Urinary retention     Past Surgical History:  Procedure Laterality Date   ABDOMINAL HYSTERECTOMY     ANTERIOR CERVICAL DECOMP/DISCECTOMY FUSION N/A 02/05/2021   Procedure: Cervical Three-Four  Anterior cervical decompression/discectomy/fusion;  Surgeon: Ashok Pall, MD;  Location: [facility];  Service: Neurosurgery;  Laterality: N/A;  RM 20   APPENDECTOMY     BACK SURGERY     CERVICAL DISC SURGERY     HAND SURGERY     KNEE SURGERY      Social  History   Socioeconomic History   Marital status: Widowed    Spouse name: Not on file   Number of children: Not on file   Years of education: Not on file   Highest education level: Not on file  Occupational History   Not on file  Tobacco Use   Smoking status: Never   Smokeless tobacco: Never  Vaping Use   Vaping Use: Never used  Substance and Sexual Activity   Alcohol use: Never   Drug use: Never   Sexual activity: Never  Other Topics Concern   Not on file  Social History Narrative   Not on file   Social Determinants of Health   Financial Resource Strain: Not on file  Food Insecurity: Not on file  Transportation Needs: Not on file  Physical Activity: Not on file  Stress: Not on file  Social Connections: Not on file  Intimate Partner Violence: Not on file   Family History  Problem Relation Age of Onset   Cancer Mother    Cardiomyopathy Father    Colon cancer Neg Hx       VITAL SIGNS BP (!) 141/58    Pulse 83    Temp (!) 97.5 F (36.4 C)    Resp 18    Ht 5'  2" (1.575 m)    Wt 163 lb 14.4 oz (74.3 kg)    SpO2 95%    BMI 29.98 kg/m   Outpatient Encounter Medications as of 09/19/2021  Medication Sig   acetaminophen (TYLENOL) 325 MG tablet Take 650 mg by mouth every 8 (eight) hours.   albuterol (VENTOLIN HFA) 108 (90 Base) MCG/ACT inhaler Inhale 1 puff into the lungs every 4 (four) hours as needed for wheezing or shortness of breath.   AMBULATORY NON FORMULARY MEDICATION Medication Name:  Continue monthly catheter changes with 28fr catheter at skilled nursing facility. (Patient taking differently: Medication Name:  Continue monthly catheter changes with 48fr catheter at skilled nursing facility.)   Artificial Saliva (BIOTENE MOISTURIZING MOUTH MT) Use as directed 1 application in the mouth or throat at bedtime. 9 pm   aspirin EC 81 MG tablet Take 81 mg by mouth daily. Swallow whole.9 am   atorvastatin (LIPITOR) 20 MG tablet Take 1 tablet (20 mg total) by mouth every  evening.   Balsam [place]-Castor Oil (VENELEX) OINT Apply topically. Apply to sacrum, coccyx, bilateral buttocks qshift for prevention.   BENZONATATE PO Take 100 mg by mouth daily as needed (cough).   chlorthalidone (HYGROTON) 25 MG tablet Take 25 mg by mouth daily.   colchicine 0.6 MG tablet Take 0.6 mg by mouth daily as needed.   DULoxetine (CYMBALTA) 60 MG capsule Take 60 mg by mouth daily.   guaiFENesin (MUCINEX) 600 MG 12 hr tablet Take by mouth 2 (two) times daily.   hydrocortisone (CORTIZONE-10) 1 % ointment Apply 1 application topically 2 (two) times daily. Apply to itchy rash on abd and (R) breast twice daily until resolved   insulin lispro (HUMALOG) 100 UNIT/ML KwikPen Inject 5 Units into the skin 2 (two) times daily with a meal.   LANTUS SOLOSTAR 100 UNIT/ML Solostar Pen Inject 15 Units into the skin at bedtime.   levETIRAcetam (KEPPRA) 750 MG tablet Take 750 mg by mouth 2 (two) times daily.   melatonin 3 MG TABS tablet Take 3 mg by mouth at bedtime.   metoCLOPramide (REGLAN) 5 MG tablet Take 5 mg by mouth 4 (four) times daily.   mirtazapine (REMERON) 15 MG tablet Take 15 mg by mouth at bedtime.   NON FORMULARY Diet - NAS, Cons CHO   omeprazole (PRILOSEC) 40 MG capsule Take 40 mg by mouth daily.   rOPINIRole (REQUIP) 1 MG tablet Take 1 mg by mouth at bedtime.   tiZANidine (ZANAFLEX) 2 MG tablet Take 2 mg by mouth every 6 (six) hours as needed for muscle spasms (for back and sciatic pain).   gabapentin (NEURONTIN) 100 MG capsule Take 1 capsule (100 mg total) by mouth 2 (two) times daily.   No facility-administered encounter medications on file as of 09/19/2021.     SIGNIFICANT DIAGNOSTIC EXAMS  PREVIOUS   12-02-20; ct of head: No acute intracranial abnormality. Polypoid sinus disease.  12-02-20: ct of cervical spine:  Postoperative and degenerative changes in the cervical spine. No acute bony abnormality.  12-02-20; ct of pelvis:  1. No evidence of acute fracture or dislocation of  the pelvis or hips. 2. 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 3. Degenerative changes in the hips with subcortical cysts on the left hip.  12-02-20: ct of left knee:  1. Subtle acute nondisplaced fracture through the medial aspect of the head of the fibula. 2. 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 3. There is a small joint effusion.  12-02-20:  mri left hip:  1. No acute findings involving the left hip. 2. Mild chronic bilateral hip avascular necrosis. Moderate degenerative hip findings bilaterally. No hip effusion or regional bursitis. 3. Mild left greater than right hamstring tendinopathy. 4. 1.3 cm left eccentric Bartholin's cyst or Gartner cyst.  12-02-20: mri lumbar spine:  1. 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 2. L3-4: Bilateral posterolateral disc bulges, more prominent on the left. Mild left foraminal encroachment that could possibly affect the left L3 nerve. Moderate to severe bilateral facet osteoarthritis. These findings could relate to back pain or referred facet syndrome pain. 3. L4-5: Bilateral facet arthropathy with 2 mm of anterolisthesis. Bulging of the disc more prominent towards the right. Mild narrowing of the lateral recesses and of the intervertebral foramen on the right, but without visible neural compression. Findings at this level could relate to back pain or referred facet syndrome pain. 4. L5-S1: Previous left hemilaminectomy. Endplate osteophytes and bulging of the disc more prominent towards the left. Facet degeneration and hypertrophy left more than right with left foraminal stenosis and subarticular lateral recess stenosis could cause left-sided neural compression.   01-25-21: ct of head; maxillofacial cervical spine:  1. No acute intracranial pathology. 2. No acute/traumatic cervical spine pathology. 3. No acute facial bone fractures.  01-26-21: ct of chest abdomen  and pelvis:  1. No noncontrast CT evidence of acute traumatic injury to the chest, abdomen, or pelvis. 2. Distended urinary bladder. Mild bilateral hydronephrosis and hydroureter to the bilateral ureterovesicular junctions. No obstructing calculus or other etiology identified. Correlate for urinary retention. 3. No fracture or dislocation of the lumbar spine. Moderate disc space height loss and osteophytosis at L5-S1. Probable small broad-based posterior disc bulges at L4-L5 and L5-S1. 4. Coronary artery disease. Aortic Atherosclerosis  01-26-21: ct of right shoulder:  1.  No acute osseous injury of the right shoulder. 2. Mild mineralization in the infraspinatus tendon as can be seen with calcific tendinosis.  01-29-21: MRI of spine:  Postoperative and multilevel degenerative changes of the cervical spine. There is severe canal stenosis with cord compression at C3-C4. Abnormal cord signal is present just below this level (at operative levels) and may reflect edema or myelomalacia No significant degenerative changes of the thoracic spine. Multilevel degenerative changes of lumbar spine similar to recent prior study.  01-30-21: renal ultrasound:  1. No acute abnormality. Small amount of right perinephric fluid, nonspecific.  02-05-21: chest x-ray:  1. Right internal jugular central venous catheter with tip over the mid SVC. No pneumothorax. 2. Hazy lung base opacities likely represent combination of atelectasis and pleural fluid. Vascular congestion.   03-06-21 chest x-ray: left lower lobe infiltrate likely representing pneumonia   03-26-21: lumbar x-ray: mild to moderate degenerative changes in lumbar spine  03-27-21; DEXA scan: t score -1.777  06-22-21: ct of abdomen and pelvis:  Left colonic diverticulosis.  No active diverticulitis. Bilateral hydronephrosis to the level of the bladder. No obstructing stones. Urinary bladder is distended with Foley catheter in place. Aortic  atherosclerosis.  07-06-21: ct head:  No intracranial abnormality or other acute findings. Chronic bilateral maxillary sinus disease.  07-06-21: ct of abdomen and pelvis:  1. Heterogeneous enhancement of the left kidney with mild left perinephric edema, suspicious for pyelonephritis. There is also mild bladder wall thickening and perivesicular fat stranding with Foley catheter in place. Improvement in bilateral hydronephrosis from prior exam. 2. Stool distends the rectum at 7.6 cm with mild distal rectal wall thickening, suspicious for fecal impaction.  3. Colonic diverticulosis without diverticulitis. Aortic Atherosclerosis   07-07-21: MRI of head:  No acute intracranial finding. No sign of acute infarction. No lesion seen to explain seizure. The patient does have mild chronic small-vessel ischemic changes considering age affecting the pons, thalami and cerebral hemispheric white matter. Left mastoid effusion  TODAY  09-14-21: ct of head:  No acute intracranial abnormality. 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 New left sphenoid sinus air-fluid level, which may be seen with acute sinusitis.      LABS REVIEWED PREVIOUS   12-02-20: wbc 6.4; hgb 11.3; hct 34.4; mcv 93.0 plt 216; glucose 209; bun 33; creat 1.13; k+ 4.3; na++ 137; ca 9.0; GFR 49; hgb a1c 8.6 12-03-20: wbc 6.5; hgb 9.8; hct 30.5 mcv 93.8 plt 182; glucose 188;bun 41; creat 1.49; k+ 4.4; na++ 134; ca 8.2; GFR 35 12-04-20; glucose 285; bun 29; creat 1.00; k+ 4.3; na++ 138; ca 8.7 GFR 56  01-25-21: wbc 6.4; hgb 10.6; hct 31.7; mcv 91.1 plt 227; glucose 152; bun 40; creat 1.2 k+ 4.2; na++ 135; ca 8.7; GFR 42; liver normal albumin 3.5 01-29-21: urine and blood culture: klebsiella pneumoniae 01-31-21: wbc 13.1; hgb 8.3; hct 23.6; mcv 88.1 plt 153; glucose 162; bun 67; creat 2.77; k+ 4.8; na++ 125; ca 7.5; GFR 17; ast 62; alt 57; albumin 1.7 02-04-21: wbc 9.8; hgb 7.6; hct 22.0; mcv 85.3 plt 212; glucose 161 bun 40; creat 1.40; k+ 3.4; na++ 127;  ca 7.5; GFR 38; ast 62; alt 57; albumin 1.7  02-08-21: wbc 10.4; hgb 9.2; hct 27.6; mcv 87.3 plt 433; glucose 186; bun 23; creat 1.00 ;k+ 4.4; na++ 131; ca 8.1; GFR 56; ast 60; alt 61; albumin 2.1  02-20-21: hgb 9.4; hct 29.9 glucose 154; bun 51; creat 1.17; k+ 4.5; na++ 136; ca 8.9; GFR 47 02-24-21: glucose 175; bun 62; creat 1.13; k+ 4.5; na++ 133; ca 9.0 GFR 49 03-06-21: wbc 5.3; hgb 8.8; hct 27.1; mcv 93.4 plt 262; glucose 453; bun 66; creat 1.30; k+ 4.6; na++ 135; ca 9.0; GFR 41; d-dimer: 2.16; CRP 3.0  03-13-21: d-dimer: 1.49 03-20-21: hep C nr; d-dimer 0.86 03-27-21: d-dimer 0.52 03-31-21: wbc 5.5; hgb 8.5; hct 26.8; mcv 93.4 plt 203; hgb a1c 7.6; chol 130; ldl 68; trig 138; hdl 34; urine micro-albumin 25.1 06-12-21: uric acid 5.9 06-15-21: wbc 6.0; hgb 9.8; hct 29.4; mcv 90,7 plt 242; glucose 187; bun 65; creat 1.33; k+ 4.2; na++ 131; ca 8.9; GFR 40; liver normal albumin 3.3  06-19-21: wbc 7.3; hgb 9.9; hct 30.1; mcv 89,9 plt 306; glucose 115; bun 29; creat 1.05; k+ 4.1; na++ 131; ca 8.7; GFR 53; liver normal albumin 3.1; urine micro-albumin 85.3 06-22-21; wbc 11.8; hgb 10.4; hct 30.9; mcv 89,3 plt 375; glucose 288; bun 44; creat 1.46; k+ 3.5; na++ 125; ca 8.3 GFR 36; liver normal albumin 3.3; lipase 37  07-06-21: wbc 7.6; hgb 9.7; hct 29.8; mcv 89,2 plt 409; glucose 92; bun 44; creat 1.09; k+ 5,3l na++ 136; ca 9.4; GFR 51; liver normal albumin 3.4  Hgb A1C: 8.3; urine culture: proteus mirabilis; + 30,000 colonies klebsiella pneumoniae  08-29-21: wbc 5.6; hgb 9.0; hct 27.6; mcv 94.2 plt 244; glucose 106; bun 80; creat 1.35; k+ 5.8; na++ 134; ca 9.0; GFR 39 liver normal albumin 3.5 vit B 12: 446; folate 7.7; iron 79 tibc 280; ferritin 178 09-04-21: glucose 66; bun 79; creat 1.44; k+ 5.5; na++ 135; ca 9.2; GFR 36 09-14-21: wbc 6.2; hgb 8.9; hct 27.1; mcv 96.1 plt  217; glucose 171; bun 58; creat 1.69; k+ 5.8; na++ 134; ca 8.7; GFR 30; urine culture: multiple species 09-15-20: k+ 6.3  NO NEW LABS.      Review of Systems  Constitutional:  Negative for malaise/fatigue.  Respiratory:  Negative for cough and shortness of breath.   Cardiovascular:  Negative for chest pain, palpitations and leg swelling.  Gastrointestinal:  Negative for abdominal pain, constipation and heartburn.  Musculoskeletal:  Negative for back pain, joint pain and myalgias.  Skin: Negative.   Neurological:  Negative for dizziness.  Psychiatric/Behavioral:  The patient is not nervous/anxious.    Physical Exam Constitutional:      General: She is not in acute distress.    Appearance: She is well-developed. She is not diaphoretic.  Neck:     Thyroid: No thyromegaly.  Cardiovascular:     Rate and Rhythm: Normal rate and regular rhythm.     Pulses: Normal pulses.     Heart sounds: Normal heart sounds.  Pulmonary:     Effort: Pulmonary effort is normal. No respiratory distress.     Breath sounds: Normal breath sounds.  Abdominal:     General: Bowel sounds are normal. There is no distension.     Palpations: Abdomen is soft.     Tenderness: There is no abdominal tenderness.  Musculoskeletal:     Cervical back: Neck supple.     Right lower leg: No edema.     Left lower leg: No edema.     Comments: Does not move legs; able to move upper extremities.   Lymphadenopathy:     Cervical: No cervical adenopathy.  Skin:    General: Skin is warm and dry.  Neurological:     Mental Status: She is alert and oriented to person, place, and time.  Psychiatric:        Mood and Affect: Mood normal.     ASSESSMENT/ PLAN:  TODAY  Avascular necrosis of both hips Quadriplegia of C1-C4 incomplete Central cord syndrome C4 level of cervical spine sequela   Will continue current medications Will continue current plan of care Will continue to monitor her status   Time spent with patient 40 minutes: medications; plan of care    [name] NP [facility]   call [phone]

## 2021-09-22 ENCOUNTER — Encounter (HOSPITAL_COMMUNITY): Payer: Self-pay

## 2021-09-22 ENCOUNTER — Inpatient Hospital Stay
Admission: RE | Admit: 2021-09-22 | Discharge: 2021-09-28 | Disposition: A | Discharge status: Still In Hospital | Payer: Medicare HMO | Source: Ambulatory Visit | Attending: Internal Medicine | Admitting: Internal Medicine

## 2021-09-22 ENCOUNTER — Ambulatory Visit (HOSPITAL_COMMUNITY): Payer: Medicare HMO | Admitting: Anesthesiology

## 2021-09-22 ENCOUNTER — Encounter (HOSPITAL_COMMUNITY)
Admission: RE | Disposition: A | Discharge status: Home / Self Care | Payer: Self-pay | Source: Home / Self Care | Attending: Internal Medicine

## 2021-09-22 ENCOUNTER — Ambulatory Visit (HOSPITAL_COMMUNITY)
Admission: RE | Admit: 2021-09-22 | Discharge: 2021-09-22 | Disposition: A | Discharge status: Home / Self Care | Payer: Medicare HMO | Attending: Internal Medicine | Admitting: Internal Medicine

## 2021-09-22 DIAGNOSIS — K2970 Gastritis, unspecified, without bleeding: Secondary | ICD-10-CM | POA: Diagnosis not present

## 2021-09-22 DIAGNOSIS — R634 Abnormal weight loss: Secondary | ICD-10-CM | POA: Diagnosis not present

## 2021-09-22 DIAGNOSIS — K319 Disease of stomach and duodenum, unspecified: Secondary | ICD-10-CM | POA: Diagnosis not present

## 2021-09-22 DIAGNOSIS — R569 Unspecified convulsions: Secondary | ICD-10-CM | POA: Diagnosis not present

## 2021-09-22 DIAGNOSIS — N189 Chronic kidney disease, unspecified: Secondary | ICD-10-CM | POA: Diagnosis not present

## 2021-09-22 DIAGNOSIS — E1122 Type 2 diabetes mellitus with diabetic chronic kidney disease: Secondary | ICD-10-CM | POA: Diagnosis not present

## 2021-09-22 DIAGNOSIS — F32A Depression, unspecified: Secondary | ICD-10-CM | POA: Diagnosis not present

## 2021-09-22 DIAGNOSIS — G8220 Paraplegia, unspecified: Secondary | ICD-10-CM | POA: Diagnosis not present

## 2021-09-22 DIAGNOSIS — K219 Gastro-esophageal reflux disease without esophagitis: Secondary | ICD-10-CM | POA: Diagnosis not present

## 2021-09-22 DIAGNOSIS — N183 Chronic kidney disease, stage 3 unspecified: Secondary | ICD-10-CM | POA: Diagnosis not present

## 2021-09-22 DIAGNOSIS — D759 Disease of blood and blood-forming organs, unspecified: Secondary | ICD-10-CM | POA: Diagnosis not present

## 2021-09-22 DIAGNOSIS — Z7982 Long term (current) use of aspirin: Secondary | ICD-10-CM | POA: Diagnosis not present

## 2021-09-22 DIAGNOSIS — D631 Anemia in chronic kidney disease: Secondary | ICD-10-CM | POA: Insufficient documentation

## 2021-09-22 DIAGNOSIS — F419 Anxiety disorder, unspecified: Secondary | ICD-10-CM | POA: Diagnosis not present

## 2021-09-22 DIAGNOSIS — I129 Hypertensive chronic kidney disease with stage 1 through stage 4 chronic kidney disease, or unspecified chronic kidney disease: Secondary | ICD-10-CM | POA: Insufficient documentation

## 2021-09-22 DIAGNOSIS — R6881 Early satiety: Secondary | ICD-10-CM | POA: Diagnosis not present

## 2021-09-22 HISTORY — PX: ESOPHAGOGASTRODUODENOSCOPY (EGD) WITH PROPOFOL: SHX5813

## 2021-09-22 HISTORY — PX: BIOPSY: SHX5522

## 2021-09-22 LAB — GLUCOSE, CAPILLARY: Glucose-Capillary: 111 mg/dL — ABNORMAL HIGH (ref 70–99)

## 2021-09-22 SURGERY — ESOPHAGOGASTRODUODENOSCOPY (EGD) WITH PROPOFOL
Anesthesia: General

## 2021-09-22 MED ORDER — LACTATED RINGERS IV SOLN
INTRAVENOUS | Status: DC | PRN
Start: 2021-09-22 — End: 2021-09-22

## 2021-09-22 MED ORDER — LIDOCAINE HCL (CARDIAC) PF 100 MG/5ML IV SOSY
PREFILLED_SYRINGE | INTRAVENOUS | Status: DC | PRN
  Administered 2021-09-22: 50 mg via INTRAVENOUS

## 2021-09-22 MED ORDER — PROPOFOL 10 MG/ML IV BOLUS
INTRAVENOUS | Status: DC | PRN
  Administered 2021-09-22: 70 mg via INTRAVENOUS
  Administered 2021-09-22: 20 mg via INTRAVENOUS

## 2021-09-22 MED ORDER — LACTATED RINGERS IV SOLN: INTRAVENOUS | Status: DC

## 2021-09-22 NOTE — Interval H&P Note (Signed)
History and Physical Interval Note:  09/22/2021 1:04 PM  [name]  has presented today for surgery, with the diagnosis of poor appetite, early satiety, weight loss.  The various methods of treatment have been discussed with the patient and family. After consideration of risks, benefits and other options for treatment, the patient has consented to  Procedure(s) with comments: ESOPHAGOGASTRODUODENOSCOPY (EGD) WITH PROPOFOL (N/A) - 1:00pm as a surgical intervention.  The patient's history has been reviewed, patient examined, no change in status, stable for surgery.  I have reviewed the patient's chart and labs.  Questions were answered to the patient's satisfaction.     [name]

## 2021-09-22 NOTE — Progress Notes (Signed)
20 gauge IV catheter removed left hand per order, catheter tip intact. Site clean, dry intact.

## 2021-09-22 NOTE — Op Note (Signed)
[facility] Patient Name: [name] Procedure Date: 09/22/2021 1:06 PM MRN: [id number] Date of Birth: 1936/04/28 Attending MD: Elon Alas. Abbey Chatters DO CSN: [id number] Age: 86 Admit Type: Outpatient Procedure:                Upper GI endoscopy Indications:              Early satiety, Weight loss Providers:                Elon Alas. Abbey Chatters, DO, Jessica Boudreaux, Hughie Closs, RN, Minette Headland L. Risa Grill, Technician Referring MD:              Medicines:                See the Anesthesia note for documentation of the                            administered medications Complications:            No immediate complications. Estimated Blood Loss:     Estimated blood loss was minimal. Procedure:                Pre-Anesthesia Assessment:                           - The anesthesia plan was to use monitored                            anesthesia care (MAC).                           After obtaining informed consent, the endoscope was                            passed under direct vision. Throughout the                            procedure, the patient's blood pressure, pulse, and                            oxygen saturations were monitored continuously. The                            GIF-H190 ([id number]) scope was introduced through the                            mouth, and advanced to the second part of duodenum.                            The upper GI endoscopy was accomplished without                            difficulty. The patient tolerated the procedure  well. Scope In: 1:17:19 PM Scope Out: 1:20:32 PM Total Procedure Duration: 0 hours 3 minutes 13 seconds  Findings:      The Z-line was regular and was found 36 cm from the incisors.      There is no endoscopic evidence of bleeding, areas of erosion,       esophagitis, ulcerations or varices in the entire esophagus.      Localized mild inflammation characterized by erythema was  found in the       gastric body. Biopsies were taken with a cold forceps for Helicobacter       pylori testing.      The duodenal bulb, first portion of the duodenum and second portion of       the duodenum were normal. Impression:               - Z-line regular, 36 cm from the incisors.                           - Gastritis. Biopsied.                           - Normal duodenal bulb, first portion of the                            duodenum and second portion of the duodenum. Moderate Sedation:      Per Anesthesia Care Recommendation:           - Patient has a contact number available for                            emergencies. The signs and symptoms of potential                            delayed complications were discussed with the                            patient. Return to normal activities tomorrow.                            Written discharge instructions were provided to the                            patient.                           - Resume previous diet.                           - Continue present medications.                           - Await pathology results.                           - Use Prilosec (omeprazole) 40 mg PO daily.                           - Use metoclopramide 5 mg  PO QID                           - Return to GI clinic in 4 months. Procedure Code(s):        --- Professional ---                           [id number], Esophagogastroduodenoscopy, flexible,                            transoral; with biopsy, single or multiple Diagnosis Code(s):        --- Professional ---                           K29.70, Gastritis, unspecified, without bleeding                           R68.81, Early satiety                           R63.4, Abnormal weight loss CPT copyright 2019 [employer] Association. All rights reserved. The codes documented in this report are preliminary and upon coder review may  be revised to meet current compliance requirements. Elon Alas. Abbey Chatters,  DO [place] Abbey Chatters, DO 09/22/2021 1:24:30 PM This report has been signed electronically. Number of Addenda: 0

## 2021-09-22 NOTE — Discharge Instructions (Addendum)
EGD Discharge instructions Please read the instructions outlined below and refer to this sheet in the next few weeks. These discharge instructions provide you with general information on caring for yourself after you leave the hospital. Your doctor may also give you specific instructions. While your treatment has been planned according to the most current medical practices available, unavoidable complications occasionally occur. If you have any problems or questions after discharge, please call your doctor. ACTIVITY You may resume your regular activity but move at a slower pace for the next 24 hours.  Take frequent rest periods for the next 24 hours.  Walking will help expel (get rid of) the air and reduce the bloated feeling in your abdomen.  No driving for 24 hours (because of the anesthesia (medicine) used during the test).  You may shower.  Do not sign any important legal documents or operate any machinery for 24 hours (because of the anesthesia used during the test).  NUTRITION Drink plenty of fluids.  You may resume your normal diet.  Begin with a light meal and progress to your normal diet.  Avoid alcoholic beverages for 24 hours or as instructed by your caregiver.  MEDICATIONS You may resume your normal medications unless your caregiver tells you otherwise.  WHAT YOU CAN EXPECT TODAY You may experience abdominal discomfort such as a feeling of fullness or gas pains.  FOLLOW-UP Your doctor will discuss the results of your test with you.  SEEK IMMEDIATE MEDICAL ATTENTION IF ANY OF THE FOLLOWING OCCUR: Excessive nausea (feeling sick to your stomach) and/or vomiting.  Severe abdominal pain and distention (swelling).  Trouble swallowing.  Temperature over 101 F (37.8 C).  Rectal bleeding or vomiting of blood.    Your EGD revealed mild amount inflammation in your stomach.  I took biopsies of this to rule out infection with a bacteria called H. pylori.  Await pathology results, my  office will contact you. Continue on Omeprazole 40 mg daily and Reglan 5 mg QID. Follow up with GI in 3-4 months.    OFFICE NOTIFIed   I hope you have a great rest of your week!  Elon Alas. Abbey Chatters, D.O. Gastroenterology and Hepatology [facility]

## 2021-09-22 NOTE — Anesthesia Postprocedure Evaluation (Signed)
Anesthesia Post Note  Patient: [name]  Procedure(s) Performed: ESOPHAGOGASTRODUODENOSCOPY (EGD) WITH PROPOFOL BIOPSY  Patient location during evaluation: Phase II Anesthesia Type: General Level of consciousness: awake Pain management: pain level controlled Vital Signs Assessment: post-procedure vital signs reviewed and stable Respiratory status: spontaneous breathing and respiratory function stable Cardiovascular status: blood pressure returned to baseline and stable Postop Assessment: no headache and no apparent nausea or vomiting Anesthetic complications: no Comments: Late entry   No notable events documented.   Last Vitals:  Vitals:   09/22/21 1327 09/22/21 1330  BP: (!) 108/58 (!) 160/74  Pulse: 78 69  Resp: 18 16  Temp:  36.7 C  SpO2: 98% 98%    Last Pain:  Vitals:   09/22/21 1330  TempSrc: Oral  PainSc: 0-No pain                 Louann Sjogren

## 2021-09-22 NOTE — Transfer of Care (Signed)
Immediate Anesthesia Transfer of Care Note  Patient: [name]  Procedure(s) Performed: ESOPHAGOGASTRODUODENOSCOPY (EGD) WITH PROPOFOL BIOPSY  Patient Location: Short Stay  Anesthesia Type:General  Level of Consciousness: awake and alert   Airway & Oxygen Therapy: Patient Spontanous Breathing  Post-op Assessment: Report given to RN and Post -op Vital signs reviewed and stable  Post vital signs: Reviewed and stable  Last Vitals:  Vitals Value Taken Time  BP 160/74 09/22/21 1330  Temp 36.7 C 09/22/21 1330  Pulse 69 09/22/21 1330  Resp 16 09/22/21 1330  SpO2 98 % 09/22/21 1330    Last Pain:  Vitals:   09/22/21 1330  TempSrc: Oral  PainSc: 0-No pain      Patients Stated Pain Goal: 4 (78/93/81 0175)  Complications: No notable events documented.

## 2021-09-22 NOTE — Anesthesia Preprocedure Evaluation (Signed)
Anesthesia Evaluation  Patient identified by MRN, date of birth, ID band Patient awake    Reviewed: Allergy & Precautions, H&P , NPO status , Patient's Chart, lab work & pertinent test results, reviewed documented beta blocker date and time   Airway Mallampati: II  TM Distance: >3 FB Neck ROM: full    Dental no notable dental hx. (+) Upper Dentures, Lower Dentures   Pulmonary neg pulmonary ROS,    Pulmonary exam normal breath sounds clear to auscultation       Cardiovascular Exercise Tolerance: Good hypertension, negative cardio ROS   Rhythm:regular Rate:Normal     Neuro/Psych Seizures -,  PSYCHIATRIC DISORDERS Anxiety Depression  Neuromuscular disease    GI/Hepatic Neg liver ROS, GERD  Medicated,  Endo/Other  negative endocrine ROSdiabetes  Renal/GU CRFRenal disease  negative genitourinary   Musculoskeletal   Abdominal   Peds  Hematology  (+) Blood dyscrasia, anemia ,   Anesthesia Other Findings C4 paraplegia  Reproductive/Obstetrics negative OB ROS                             Anesthesia Physical Anesthesia Plan  ASA: 3  Anesthesia Plan: General   Post-op Pain Management:    Induction:   PONV Risk Score and Plan: Propofol infusion  Airway Management Planned:   Additional Equipment:   Intra-op Plan:   Post-operative Plan:   Informed Consent: I have reviewed the patients History and Physical, chart, labs and discussed the procedure including the risks, benefits and alternatives for the proposed anesthesia with the patient or authorized representative who has indicated his/her understanding and acceptance.     Dental Advisory Given  Plan Discussed with: CRNA  Anesthesia Plan Comments:         Anesthesia Quick Evaluation

## 2021-09-23 ENCOUNTER — Non-Acute Institutional Stay (SKILLED_NURSING_FACILITY): Payer: Medicare HMO | Admitting: Adult Health

## 2021-09-23 ENCOUNTER — Other Ambulatory Visit: Payer: Self-pay | Admitting: Adult Health

## 2021-09-23 ENCOUNTER — Telehealth: Payer: Self-pay | Admitting: *Deleted

## 2021-09-23 ENCOUNTER — Encounter: Payer: Self-pay | Admitting: Adult Health

## 2021-09-23 DIAGNOSIS — E785 Hyperlipidemia, unspecified: Secondary | ICD-10-CM

## 2021-09-23 DIAGNOSIS — R569 Unspecified convulsions: Secondary | ICD-10-CM | POA: Diagnosis not present

## 2021-09-23 DIAGNOSIS — E1122 Type 2 diabetes mellitus with diabetic chronic kidney disease: Secondary | ICD-10-CM | POA: Diagnosis not present

## 2021-09-23 DIAGNOSIS — N1831 Chronic kidney disease, stage 3a: Secondary | ICD-10-CM | POA: Diagnosis not present

## 2021-09-23 DIAGNOSIS — Z794 Long term (current) use of insulin: Secondary | ICD-10-CM | POA: Diagnosis not present

## 2021-09-23 DIAGNOSIS — E1169 Type 2 diabetes mellitus with other specified complication: Secondary | ICD-10-CM

## 2021-09-23 DIAGNOSIS — I129 Hypertensive chronic kidney disease with stage 1 through stage 4 chronic kidney disease, or unspecified chronic kidney disease: Secondary | ICD-10-CM | POA: Diagnosis not present

## 2021-09-23 DIAGNOSIS — E1140 Type 2 diabetes mellitus with diabetic neuropathy, unspecified: Secondary | ICD-10-CM

## 2021-09-23 DIAGNOSIS — M6281 Muscle weakness (generalized): Secondary | ICD-10-CM | POA: Diagnosis not present

## 2021-09-23 DIAGNOSIS — G8252 Quadriplegia, C1-C4 incomplete: Secondary | ICD-10-CM | POA: Diagnosis not present

## 2021-09-23 DIAGNOSIS — R279 Unspecified lack of coordination: Secondary | ICD-10-CM | POA: Diagnosis not present

## 2021-09-23 MED ORDER — LORAZEPAM 2 MG/ML IJ SOLN: 1.0000 mg | INTRAMUSCULAR | 0 refills | Status: DC | PRN

## 2021-09-23 NOTE — Telephone Encounter (Signed)
Transition Care Management Unsuccessful Follow-up Telephone Call  Date of discharge and from where:  09/22/2020 [facility]  Attempts:  1st Attempt  Reason for unsuccessful TCM follow-up call:  Unable to reach patient Patient is a  [facility] SN[facility] Resident.

## 2021-09-23 NOTE — Progress Notes (Signed)
Location:  [place] Room Number: 154-W Place of Service:  SNF (31)   CODE STATUS: DNR  Allergies  Allergen Reactions   Ace Inhibitors Other (See Comments)    Hyperkalemia    Codeine    Sulfa Antibiotics     Chief Complaint  Patient presents with   Medical Management of Chronic Issues                   Seizures:  Type 2 diabetes mellitus with diabetic neuropathy with long term current use of insulin:  Hyperlipidemia associated with type 2 diabetes mellitus: Hypertension associated with type 2 diabetes mellitus:    HPI:  She is a 86 year old long term resident of this facility being seen for the management of her chronic illnesses: Seizures:  Type 2 diabetes mellitus with diabetic neuropathy with long term current use of insulin:  Hyperlipidemia associated with type 2 diabetes mellitus: Hypertension associated with type 2 diabetes mellitus:. She has had 2 seizures today; both requiring IM ativan. She is lethargic. There are no indications of pain present.   Past Medical History:  Diagnosis Date   Anemia    Anxiety    Atherosclerosis of aorta (HCC)    Central cord syndrome at C4 level of cervical spinal cord, subsequent encounter ([place])    CKD (chronic kidney disease)    stage 3   Depression    DM type 2 with diabetic peripheral neuropathy (HCC)    Dysphagia    GERD (gastroesophageal reflux disease)    Gout    High cholesterol    HTN (hypertension)    Hyponatremia    MDD (major depressive disorder)    Neck pain    Neuropathy    Quadriplegia, C1-C4 incomplete (HCC)    Radiculopathy    RLS (restless legs syndrome)    Urinary retention     Past Surgical History:  Procedure Laterality Date   ABDOMINAL HYSTERECTOMY     ANTERIOR CERVICAL DECOMP/DISCECTOMY FUSION N/A 02/05/2021   Procedure: Cervical Three-Four  Anterior cervical decompression/discectomy/fusion;  Surgeon: Ashok Pall, MD;  Location: [place];  Service: Neurosurgery;  Laterality: N/A;   RM 20   APPENDECTOMY     BACK SURGERY     CERVICAL DISC SURGERY     HAND SURGERY     KNEE SURGERY      Social History   Socioeconomic History   Marital status: Widowed    Spouse name: Not on file   Number of children: Not on file   Years of education: Not on file   Highest education level: Not on file  Occupational History   Not on file  Tobacco Use   Smoking status: Never   Smokeless tobacco: Never  Vaping Use   Vaping Use: Never used  Substance and Sexual Activity   Alcohol use: Never   Drug use: Never   Sexual activity: Never  Other Topics Concern   Not on file  Social History Narrative   Not on file   Social Determinants of Health   Financial Resource Strain: Not on file  Food Insecurity: Not on file  Transportation Needs: Not on file  Physical Activity: Not on file  Stress: Not on file  Social Connections: Not on file  Intimate Partner Violence: Not on file   Family History  Problem Relation Age of Onset   Cancer Mother    Cardiomyopathy Father    Colon cancer Neg Hx  VITAL SIGNS BP 136/67    Pulse 77    Temp (!) 97.3 F (36.3 C)    Resp 18    Ht 5\' 2"  (1.575 m)    Wt 163 lb 14.4 oz (74.3 kg)    SpO2 95%    BMI 29.98 kg/m   Outpatient Encounter Medications as of 09/23/2021  Medication Sig   acetaminophen (TYLENOL) 325 MG tablet Take 650 mg by mouth every 8 (eight) hours.   albuterol (VENTOLIN HFA) 108 (90 Base) MCG/ACT inhaler Inhale 1 puff into the lungs every 4 (four) hours as needed for wheezing or shortness of breath.   Artificial Saliva (BIOTENE MOISTURIZING MOUTH MT) Use as directed 1 application in the mouth or throat at bedtime. 9 pm   aspirin EC 81 MG tablet Take 81 mg by mouth daily. Swallow whole.9 am   atorvastatin (LIPITOR) 20 MG tablet Take 1 tablet (20 mg total) by mouth every evening.   Balsam [place]-Castor Oil (VENELEX) OINT Apply topically. Apply to sacrum, coccyx, bilateral buttocks qshift for prevention.   BENZONATATE PO Take  100 mg by mouth every 8 (eight) hours as needed (cough).   chlorthalidone (HYGROTON) 25 MG tablet Take 25 mg by mouth daily.   colchicine 0.6 MG tablet Take 0.6 mg by mouth daily as needed.   DULoxetine (CYMBALTA) 60 MG capsule Take 60 mg by mouth daily.   gabapentin (NEURONTIN) 100 MG capsule Take 1 capsule (100 mg total) by mouth 2 (two) times daily.   guaiFENesin (MUCINEX) 600 MG 12 hr tablet Take by mouth 2 (two) times daily.   hydrocortisone 1 % ointment Apply 1 application topically 2 (two) times daily. Apply to itchy rash on abd and (R) breast twice daily until resolved   insulin lispro (HUMALOG) 100 UNIT/ML KwikPen Inject 5 Units into the skin 2 (two) times daily with a meal.   LANTUS SOLOSTAR 100 UNIT/ML Solostar Pen Inject 15 Units into the skin at bedtime.   levETIRAcetam (KEPPRA) 750 MG tablet Take 750 mg by mouth 2 (two) times daily.   LORazepam (ATIVAN) 2 MG/ML concentrated solution Take 1 mg by mouth as needed for anxiety (Administer every 15 minutes up to 4 times for seizures lasting longer than 1 minute).   melatonin 3 MG TABS tablet Take 3 mg by mouth at bedtime.   metoCLOPramide (REGLAN) 5 MG tablet Take 2.5 mg by mouth 4 (four) times daily.   NON FORMULARY Diet - NAS, Cons CHO   omeprazole (PRILOSEC) 40 MG capsule Take 40 mg by mouth daily.   rOPINIRole (REQUIP) 1 MG tablet Take 1 mg by mouth at bedtime.   tiZANidine (ZANAFLEX) 2 MG tablet Take 2 mg by mouth every 6 (six) hours as needed for muscle spasms (for back and sciatic pain).   AMBULATORY NON FORMULARY MEDICATION Medication Name:  Continue monthly catheter changes with 24fr catheter at skilled nursing facility. (Patient taking differently: Medication Name:  Continue monthly catheter changes with 42fr catheter at skilled nursing facility.)   [DISCONTINUED] mirtazapine (REMERON) 15 MG tablet Take 15 mg by mouth at bedtime.   No facility-administered encounter medications on file as of 09/23/2021.     SIGNIFICANT  DIAGNOSTIC EXAMS   PREVIOUS   12-02-20; ct of head: No acute intracranial abnormality. Polypoid sinus disease.  12-02-20: ct of cervical spine:  Postoperative and degenerative changes in the cervical spine. No acute bony abnormality.  12-02-20; ct of pelvis:  1. No evidence of acute fracture or dislocation of the pelvis or  hips. 2. 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 3. Degenerative changes in the hips with subcortical cysts on the left hip.  12-02-20: ct of left knee:  1. Subtle acute nondisplaced fracture through the medial aspect of the head of the fibula. 2. 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 3. There is a small joint effusion.  12-02-20: mri left hip:  1. No acute findings involving the left hip. 2. Mild chronic bilateral hip avascular necrosis. Moderate degenerative hip findings bilaterally. No hip effusion or regional bursitis. 3. Mild left greater than right hamstring tendinopathy. 4. 1.3 cm left eccentric Bartholin's cyst or Gartner cyst.  12-02-20: mri lumbar spine:  1. 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 2. L3-4: Bilateral posterolateral disc bulges, more prominent on the left. Mild left foraminal encroachment that could possibly affect the left L3 nerve. Moderate to severe bilateral facet osteoarthritis. These findings could relate to back pain or referred facet syndrome pain. 3. L4-5: Bilateral facet arthropathy with 2 mm of anterolisthesis. Bulging of the disc more prominent towards the right. Mild narrowing of the lateral recesses and of the intervertebral foramen on the right, but without visible neural compression. Findings at this level could relate to back pain or referred facet syndrome pain. 4. L5-S1: Previous left hemilaminectomy. Endplate osteophytes and bulging of the disc more prominent towards the left. Facet degeneration and hypertrophy left more than right with left  foraminal stenosis and subarticular lateral recess stenosis could cause left-sided neural compression.   01-25-21: ct of head; maxillofacial cervical spine:  1. No acute intracranial pathology. 2. No acute/traumatic cervical spine pathology. 3. No acute facial bone fractures.  01-26-21: ct of chest abdomen and pelvis:  1. No noncontrast CT evidence of acute traumatic injury to the chest, abdomen, or pelvis. 2. Distended urinary bladder. Mild bilateral hydronephrosis and hydroureter to the bilateral ureterovesicular junctions. No obstructing calculus or other etiology identified. Correlate for urinary retention. 3. No fracture or dislocation of the lumbar spine. Moderate disc space height loss and osteophytosis at L5-S1. Probable small broad-based posterior disc bulges at L4-L5 and L5-S1. 4. Coronary artery disease. Aortic Atherosclerosis  01-26-21: ct of right shoulder:  1.  No acute osseous injury of the right shoulder. 2. Mild mineralization in the infraspinatus tendon as can be seen with calcific tendinosis.  01-29-21: MRI of spine:  Postoperative and multilevel degenerative changes of the cervical spine. There is severe canal stenosis with cord compression at C3-C4. Abnormal cord signal is present just below this level (at operative levels) and may reflect edema or myelomalacia No significant degenerative changes of the thoracic spine. Multilevel degenerative changes of lumbar spine similar to recent prior study.  01-30-21: renal ultrasound:  1. No acute abnormality. Small amount of right perinephric fluid, nonspecific.  02-05-21: chest x-ray:  1. Right internal jugular central venous catheter with tip over the mid SVC. No pneumothorax. 2. Hazy lung base opacities likely represent combination of atelectasis and pleural fluid. Vascular congestion.   03-06-21 chest x-ray: left lower lobe infiltrate likely representing pneumonia   03-26-21: lumbar x-ray: mild to moderate degenerative changes in  lumbar spine  03-27-21; DEXA scan: t score -1.777  06-22-21: ct of abdomen and pelvis:  Left colonic diverticulosis.  No active diverticulitis. Bilateral hydronephrosis to the level of the bladder. No obstructing stones. Urinary bladder is distended with Foley catheter in place. Aortic atherosclerosis.  07-06-21: ct head:  No intracranial abnormality or other acute findings. Chronic bilateral maxillary sinus disease.  07-06-21: ct of abdomen and pelvis:  1. Heterogeneous enhancement of the left kidney with mild left perinephric edema, suspicious for pyelonephritis. There is also mild bladder wall thickening and perivesicular fat stranding with Foley catheter in place. Improvement in bilateral hydronephrosis from prior exam. 2. Stool distends the rectum at 7.6 cm with mild distal rectal wall thickening, suspicious for fecal impaction. 3. Colonic diverticulosis without diverticulitis. Aortic Atherosclerosis   07-07-21: MRI of head:  No acute intracranial finding. No sign of acute infarction. No lesion seen to explain seizure. The patient does have mild chronic small-vessel ischemic changes considering age affecting the pons, thalami and cerebral hemispheric white matter. Left mastoid effusion  09-14-21: ct of head:  No acute intracranial abnormality. 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 New left sphenoid sinus air-fluid level, which may be seen with acute sinusitis.  NO NEW EXAMS.      LABS REVIEWED PREVIOUS   12-02-20: wbc 6.4; hgb 11.3; hct 34.4; mcv 93.0 plt 216; glucose 209; bun 33; creat 1.13; k+ 4.3; na++ 137; ca 9.0; GFR 49; hgb a1c 8.6 12-03-20: wbc 6.5; hgb 9.8; hct 30.5 mcv 93.8 plt 182; glucose 188;bun 41; creat 1.49; k+ 4.4; na++ 134; ca 8.2; GFR 35 12-04-20; glucose 285; bun 29; creat 1.00; k+ 4.3; na++ 138; ca 8.7 GFR 56  01-25-21: wbc 6.4; hgb 10.6; hct 31.7; mcv 91.1 plt 227; glucose 152; bun 40; creat 1.2 k+ 4.2; na++ 135; ca 8.7; GFR 42; liver normal albumin 3.5 01-29-21:  urine and blood culture: klebsiella pneumoniae 01-31-21: wbc 13.1; hgb 8.3; hct 23.6; mcv 88.1 plt 153; glucose 162; bun 67; creat 2.77; k+ 4.8; na++ 125; ca 7.5; GFR 17; ast 62; alt 57; albumin 1.7 02-04-21: wbc 9.8; hgb 7.6; hct 22.0; mcv 85.3 plt 212; glucose 161 bun 40; creat 1.40; k+ 3.4; na++ 127; ca 7.5; GFR 38; ast 62; alt 57; albumin 1.7  02-08-21: wbc 10.4; hgb 9.2; hct 27.6; mcv 87.3 plt 433; glucose 186; bun 23; creat 1.00 ;k+ 4.4; na++ 131; ca 8.1; GFR 56; ast 60; alt 61; albumin 2.1  02-20-21: hgb 9.4; hct 29.9 glucose 154; bun 51; creat 1.17; k+ 4.5; na++ 136; ca 8.9; GFR 47 02-24-21: glucose 175; bun 62; creat 1.13; k+ 4.5; na++ 133; ca 9.0 GFR 49 03-06-21: wbc 5.3; hgb 8.8; hct 27.1; mcv 93.4 plt 262; glucose 453; bun 66; creat 1.30; k+ 4.6; na++ 135; ca 9.0; GFR 41; d-dimer: 2.16; CRP 3.0  03-13-21: d-dimer: 1.49 03-20-21: hep C nr; d-dimer 0.86 03-27-21: d-dimer 0.52 03-31-21: wbc 5.5; hgb 8.5; hct 26.8; mcv 93.4 plt 203; hgb a1c 7.6; chol 130; ldl 68; trig 138; hdl 34; urine micro-albumin 25.1 06-12-21: uric acid 5.9 06-15-21: wbc 6.0; hgb 9.8; hct 29.4; mcv 90,7 plt 242; glucose 187; bun 65; creat 1.33; k+ 4.2; na++ 131; ca 8.9; GFR 40; liver normal albumin 3.3  06-19-21: wbc 7.3; hgb 9.9; hct 30.1; mcv 89,9 plt 306; glucose 115; bun 29; creat 1.05; k+ 4.1; na++ 131; ca 8.7; GFR 53; liver normal albumin 3.1; urine micro-albumin 85.3 06-22-21; wbc 11.8; hgb 10.4; hct 30.9; mcv 89,3 plt 375; glucose 288; bun 44; creat 1.46; k+ 3.5; na++ 125; ca 8.3 GFR 36; liver normal albumin 3.3; lipase 37  07-06-21: wbc 7.6; hgb 9.7; hct 29.8; mcv 89,2 plt 409; glucose 92; bun 44; creat 1.09; k+ 5,3l na++ 136; ca 9.4; GFR 51; liver normal albumin 3.4  Hgb A1C: 8.3; urine culture: proteus mirabilis; + 30,000 colonies klebsiella pneumoniae  08-29-21:  wbc 5.6; hgb 9.0; hct 27.6; mcv 94.2 plt 244; glucose 106; bun 80; creat 1.35; k+ 5.8; na++ 134; ca 9.0; GFR 39 liver normal albumin 3.5 vit B 12: 446; folate 7.7;  iron 79 tibc 280; ferritin 178 09-04-21: glucose 66; bun 79; creat 1.44; k+ 5.5; na++ 135; ca 9.2; GFR 36 09-14-21: wbc 6.2; hgb 8.9; hct 27.1; mcv 96.1 plt 217; glucose 171; bun 58; creat 1.69; k+ 5.8; na++ 134; ca 8.7; GFR 30; urine culture: multiple species 09-15-20: k+ 6.3 09-16-21: k+ 5.0   NO NEW LABS.    Review of Systems  Unable to perform ROS: Medical condition   Physical Exam Constitutional:      General: She is not in acute distress.    Appearance: She is well-developed. She is not diaphoretic.  Neck:     Thyroid: No thyromegaly.  Cardiovascular:     Rate and Rhythm: Normal rate and regular rhythm.     Pulses: Normal pulses.     Heart sounds: Normal heart sounds.  Pulmonary:     Effort: Pulmonary effort is normal. No respiratory distress.     Breath sounds: Normal breath sounds.  Abdominal:     General: Bowel sounds are normal. There is no distension.     Palpations: Abdomen is soft.     Tenderness: There is no abdominal tenderness.  Genitourinary:    Comments: foley Musculoskeletal:     Cervical back: Neck supple.     Right lower leg: No edema.     Left lower leg: No edema.     Comments: Does not move legs; able to move upper extremities.  Lymphadenopathy:     Cervical: No cervical adenopathy.  Skin:    General: Skin is warm and dry.  Neurological:     Mental Status: She is alert.     Comments: Disoriented   Psychiatric:        Mood and Affect: Mood normal.     ASSESSMENT/ PLAN:   TODAY  Seizures: is worse: will continue keppra 750 mg twice daily will begin lamictal 12.5 mg daily; will continue prn ativan will have her to see neurology   2. Type 2 diabetes mellitus with diabetic neuropathy with long term current use of insulin: hgb a1c 8.3; will continue lantus 20 units nightly and humalog 5 units twice daily with meals.   3. Hyperlipidemia associated with type 2 diabetes mellitus: stable LDL 68 will continue lipitor 20 mg daily   4. Hypertension  associated with type 2 diabetes mellitus: is stable LDL 68 will continue lipitor 20 mg daily   PREVIOUS   5. Hypertension associated with type 2 diabetes mellitus: is stable b/p 148/89 will continue hygroton 25 mg daily; is off lisinopril due to renal failure  6. GERD without esophagitis: is stable will continue prilosec 40 mg daily   7. Chronic constipation: currently not on medications will monitor    8. Lumbar radicular syndrome/vascular necrosis bilateral hips: is without change will continue tylenol 650 mg every 8 hours and gabapentin 100 mg  twice daily; is off prednisone and robaxin   9. Diabetic peripheral neuropathy: is stable will continue gabapentin 100 mg twice daily   10. CKD stage 3b due to type 2 diabetes; bun 14; creat 0.88; GFR >60  will monitor   11. Normocytic anemia: is stable hgb 8.7 will monitor   12. Urine retention: is stable has foley will monitor   13. Chronic anxiety: is stable will continue remeron 15 mg nightly  14. Protein calorie malnutrition severe: is without change: albumin 3.4 will continue supplements as directed   15. Aortic atherosclerosis: ( ct 01-26-21) will monitor   16. Central cord syndrome at C4 level of cervical spine cord subsequent encounter: cord compression; quadriplegia C1-4 incomplete: is without change in status: will continue  asa 81 mg daily;   17. Restless leg syndrome: is stable will continue requip 1 mg nightly     [name] NP [facility]  call [phone]

## 2021-09-24 LAB — SURGICAL PATHOLOGY

## 2021-09-25 DIAGNOSIS — Z794 Long term (current) use of insulin: Secondary | ICD-10-CM | POA: Insufficient documentation

## 2021-09-25 DIAGNOSIS — R279 Unspecified lack of coordination: Secondary | ICD-10-CM | POA: Diagnosis not present

## 2021-09-25 DIAGNOSIS — M6281 Muscle weakness (generalized): Secondary | ICD-10-CM | POA: Diagnosis not present

## 2021-09-25 DIAGNOSIS — G8252 Quadriplegia, C1-C4 incomplete: Secondary | ICD-10-CM | POA: Diagnosis not present

## 2021-09-25 DIAGNOSIS — Z1159 Encounter for screening for other viral diseases: Secondary | ICD-10-CM | POA: Diagnosis not present

## 2021-09-26 DIAGNOSIS — R279 Unspecified lack of coordination: Secondary | ICD-10-CM | POA: Diagnosis not present

## 2021-09-26 DIAGNOSIS — M6281 Muscle weakness (generalized): Secondary | ICD-10-CM | POA: Diagnosis not present

## 2021-09-26 DIAGNOSIS — G8252 Quadriplegia, C1-C4 incomplete: Secondary | ICD-10-CM | POA: Diagnosis not present

## 2021-09-27 DIAGNOSIS — G8252 Quadriplegia, C1-C4 incomplete: Secondary | ICD-10-CM | POA: Diagnosis not present

## 2021-09-27 DIAGNOSIS — M6281 Muscle weakness (generalized): Secondary | ICD-10-CM | POA: Diagnosis not present

## 2021-09-27 DIAGNOSIS — R279 Unspecified lack of coordination: Secondary | ICD-10-CM | POA: Diagnosis not present

## 2021-09-28 ENCOUNTER — Other Ambulatory Visit: Payer: Self-pay

## 2021-09-28 ENCOUNTER — Inpatient Hospital Stay
Admission: RE | Admit: 2021-09-28 | Discharge: 2022-09-21 | Disposition: A | Discharge status: Skilled Nursing Facility | Payer: Medicare HMO | Source: Ambulatory Visit | Attending: Internal Medicine | Admitting: Internal Medicine

## 2021-09-28 ENCOUNTER — Emergency Department (HOSPITAL_COMMUNITY): Payer: Medicare HMO

## 2021-09-28 ENCOUNTER — Encounter (HOSPITAL_COMMUNITY): Payer: Self-pay | Admitting: *Deleted

## 2021-09-28 ENCOUNTER — Emergency Department (HOSPITAL_COMMUNITY)
Admission: EM | Admit: 2021-09-28 | Discharge: 2021-09-28 | Disposition: A | Discharge status: Home / Self Care | Payer: Medicare HMO | Attending: Emergency Medicine | Admitting: Emergency Medicine

## 2021-09-28 DIAGNOSIS — Z794 Long term (current) use of insulin: Secondary | ICD-10-CM | POA: Diagnosis not present

## 2021-09-28 DIAGNOSIS — R569 Unspecified convulsions: Secondary | ICD-10-CM | POA: Insufficient documentation

## 2021-09-28 DIAGNOSIS — R519 Headache, unspecified: Secondary | ICD-10-CM | POA: Diagnosis not present

## 2021-09-28 DIAGNOSIS — Z7982 Long term (current) use of aspirin: Secondary | ICD-10-CM | POA: Insufficient documentation

## 2021-09-28 DIAGNOSIS — L308 Other specified dermatitis: Secondary | ICD-10-CM | POA: Diagnosis not present

## 2021-09-28 DIAGNOSIS — L2489 Irritant contact dermatitis due to other agents: Secondary | ICD-10-CM | POA: Diagnosis not present

## 2021-09-28 DIAGNOSIS — R21 Rash and other nonspecific skin eruption: Secondary | ICD-10-CM | POA: Diagnosis not present

## 2021-09-28 DIAGNOSIS — M4802 Spinal stenosis, cervical region: Principal | ICD-10-CM

## 2021-09-28 DIAGNOSIS — G40909 Epilepsy, unspecified, not intractable, without status epilepticus: Secondary | ICD-10-CM | POA: Diagnosis not present

## 2021-09-28 LAB — CBC WITH DIFFERENTIAL/PLATELET
Abs Immature Granulocytes: 0.01 10*3/uL (ref 0.00–0.07)
Basophils Absolute: 0 10*3/uL (ref 0.0–0.1)
Basophils Relative: 1 %
Eosinophils Absolute: 0.9 10*3/uL — ABNORMAL HIGH (ref 0.0–0.5)
Eosinophils Relative: 14 %
HCT: 27.9 % — ABNORMAL LOW (ref 36.0–46.0)
Hemoglobin: 9 g/dL — ABNORMAL LOW (ref 12.0–15.0)
Immature Granulocytes: 0 %
Lymphocytes Relative: 39 %
Lymphs Abs: 2.4 10*3/uL (ref 0.7–4.0)
MCH: 30.7 pg (ref 26.0–34.0)
MCHC: 32.3 g/dL (ref 30.0–36.0)
MCV: 95.2 fL (ref 80.0–100.0)
Monocytes Absolute: 0.4 10*3/uL (ref 0.1–1.0)
Monocytes Relative: 6 %
Neutro Abs: 2.5 10*3/uL (ref 1.7–7.7)
Neutrophils Relative %: 40 %
Platelets: 252 10*3/uL (ref 150–400)
RBC: 2.93 MIL/uL — ABNORMAL LOW (ref 3.87–5.11)
RDW: 15.3 % (ref 11.5–15.5)
WBC: 6.2 10*3/uL (ref 4.0–10.5)
nRBC: 0 % (ref 0.0–0.2)

## 2021-09-28 LAB — COMPREHENSIVE METABOLIC PANEL
ALT: 14 U/L (ref 0–44)
AST: 15 U/L (ref 15–41)
Albumin: 3.8 g/dL (ref 3.5–5.0)
Alkaline Phosphatase: 54 U/L (ref 38–126)
Anion gap: 6 (ref 5–15)
BUN: 57 mg/dL — ABNORMAL HIGH (ref 8–23)
CO2: 19 mmol/L — ABNORMAL LOW (ref 22–32)
Calcium: 8.9 mg/dL (ref 8.9–10.3)
Chloride: 108 mmol/L (ref 98–111)
Creatinine, Ser: 1.33 mg/dL — ABNORMAL HIGH (ref 0.44–1.00)
GFR, Estimated: 40 mL/min — ABNORMAL LOW (ref >60)
Glucose, Bld: 160 mg/dL — ABNORMAL HIGH (ref 70–99)
Potassium: 5.1 mmol/L (ref 3.5–5.1)
Sodium: 133 mmol/L — ABNORMAL LOW (ref 135–145)
Total Bilirubin: 0.3 mg/dL (ref 0.3–1.2)
Total Protein: 7.1 g/dL (ref 6.5–8.1)

## 2021-09-28 LAB — MAGNESIUM: Magnesium: 1.8 mg/dL (ref 1.7–2.4)

## 2021-09-28 LAB — CBG MONITORING, ED: Glucose-Capillary: 148 mg/dL — ABNORMAL HIGH (ref 70–99)

## 2021-09-28 MED ORDER — LEVETIRACETAM 500 MG PO TABS: ORAL_TABLET | ORAL | 0 refills | Status: DC

## 2021-09-28 MED ORDER — HYDROCORTISONE 1 % EX LOTN: 1.0000 "application " | TOPICAL_LOTION | Freq: Two times a day (BID) | CUTANEOUS | 0 refills | Status: DC | PRN

## 2021-09-28 MED ORDER — LEVETIRACETAM IN NACL 1500 MG/100ML IV SOLN
1500.0000 mg | Freq: Once | INTRAVENOUS | Status: AC
  Administered 2021-09-28: 1500 mg via INTRAVENOUS
  Filled 2021-09-28: qty 100

## 2021-09-28 NOTE — ED Triage Notes (Signed)
Pt brought in by [facility] from [facility] with c/o seizure today. Nursing staff reported that pt started seizing at 1348 and it didn't end until 1440 despite receiving a total of Ativan 2mg  in 3 different doses. They also report pt then seized again at 1455 for unknown amount of time. EMS arrived and reports pt was alert and oriented, not postictal.

## 2021-09-28 NOTE — ED Notes (Signed)
Report given to [employer] at [facility]

## 2021-09-28 NOTE — Consult Note (Signed)
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 Patient appears to be at her baseline now. Recommend metabolic/infectious workup to exclude any cause for breakthrough seizures and increase Keppra to 1000mg  in the am and 1500mg  at bedtime. Continue lamictal. I would recommend outpatient neurology follow-up. Patient is not close monitoring setting and can be discharged back with close supervision as long as the labs, CT scan and metabolic work-up is negative.  Our recommendations are outlined below.  Laboratory Studies: Comprehensive Metabolic profile CBC with diff Lactic acid magnesium, phosphorus  Nursing Recommendations: Maintain Euglycemia and Euthermia  DVT Prophylaxis: Choice of Primary Team   Metrics: [facility] Notification Time: 09/28/2021 16:46:47 Stamp Time: 09/28/2021 16:48:55 Callback Response Time: 09/28/2021 16:49:28   ----------------------------------------------------------------------------------------------------  Chief Complaint: seizures  History of Present Illness: Patient is a 86 year old Female. 86yo female with a PMH of seizures since childhood, who presents to the ED with multiple breakthrough seizures. Seizures are described as generalized seizures but left side was twitching. Ativan was given 3 different times at the nursing home, but overall lasting an hour. No seizure activity has been noted Keppra 750mg  BID and lamictal 12.5 daily According to her granddaughter, she can  normally converse. Pt. presented from rehab with having multiple seizures. Pt. given Ativan multiple times at rehab.    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  Past Surgical History: There Is No Surgical History Contributory To Todays Visit   Examination: BP(141/85), Pulse(75), Blood Glucose(160)  Neuro Exam:  General: She is answering questions appropriately and following commands  Speech: Speech appears baseline now according to granddaughter.  Face: Symmetric:  Facial Sensation: Intact:  Visual Fields: Intact:  Extraocular Movements: Intact:  Motor Exam: Baseline weakness but able to lift BUE against gravity. Unable to lift BLE to gravity but can wiggle toes.  Coordination: not testable     Patient / Family was informed the Neurology Consult would occur via TeleHealth consult by way of interactive audio and video telecommunications and consented to receiving care in this manner.  Patient is being evaluated for possible acute neurologic impairment and high probability of imminent or life - threatening deterioration.I spent total of 35 minutes providing care to this patient, including time for face to face visit via telemedicine, review of medical records, imaging studies and discussion of findings with providers, the patient and / or family.   Dr Faustino Congress   [facility] [phone]  Case [id number]

## 2021-09-28 NOTE — ED Provider Notes (Signed)
[facility] EMERGENCY DEPARTMENT Provider Note   CSN: [id number] Arrival date & time: 09/28/21  1521     History  Chief Complaint  Patient presents with   Seizures    [name] is a 86 y.o. female.  HPI 86 year old female with a history of seizures presents with seizures from the [facility].  Nursing staff told EMS that she started having seizures and did not stop despite multiple doses of Ativan.  EMS reports the patient has been alert and oriented and well-appearing during their transport.  Patient states she was talking to her daughter and then was helped into bed and does not remember much past that.  She states she is had an on-and-off headache for the past week but no current headache.  No neck stiffness.  She reports a rash to her left flank for a couple weeks.  She has chronic weakness, legs greater than arms, especially the left arm.  I discussed over the phone with the patient's nurse at the [facility].  They are putting her into bed and her eyes became closed and she was unable to be woken, even with a sternal rub.  Her left eyes started twitching, left arm shaking and this is similar to prior seizures.  She had 2 on 1/24 that lasted 7 and 30 minutes respectively.  However today it in total lasted about an hour.  She was given IM Ativan 1 mg 3 different times.  She had another brief 1 prior to EMS transport.  They have been giving her the Keppra and Lamictal, including this morning.  She recently had Lamictal added on 1/24, med rec shows it is 12.5 mg/day.  Has not been sick recently.  Home Medications Prior to Admission medications   Medication Sig Start Date End Date Taking? Authorizing Provider  hydrocortisone 1 % lotion Apply 1 application topically 2 (two) times daily as needed for itching. 09/28/21  Yes Sherwood Gambler, MD  levETIRAcetam (KEPPRA) 500 MG tablet 1000 mg in the morning and 1500 mg in the evening 09/28/21  Yes Sherwood Gambler, MD  acetaminophen (TYLENOL)  325 MG tablet Take 650 mg by mouth every 8 (eight) hours.    [provider]  albuterol (VENTOLIN HFA) 108 (90 Base) MCG/ACT inhaler Inhale 1 puff into the lungs every 4 (four) hours as needed for wheezing or shortness of breath.    [provider]  AMBULATORY NON FORMULARY MEDICATION Medication Name:  Continue monthly catheter changes with 108fr catheter at skilled nursing facility. Patient taking differently: Medication Name:  Continue monthly catheter changes with 13fr catheter at skilled nursing facility. 08/12/21   McKenzie, Candee Furbish, MD  Artificial Saliva (BIOTENE MOISTURIZING MOUTH MT) Use as directed 1 application in the mouth or throat at bedtime. 9 pm    [provider]  aspirin EC 81 MG tablet Take 81 mg by mouth daily. Swallow whole.9 am    [provider]  atorvastatin (LIPITOR) 20 MG tablet Take 1 tablet (20 mg total) by mouth every evening. 12/20/20   Nyoka Cowden, Phylis Bougie, NP  Roseanne Kaufman [place]-Cast[employer] Oil [facility]) OINT Apply topically. Apply to sacrum, coccyx, bilateral buttocks qshift for prevention.    [provider]  BENZONATATE PO Take 100 mg by mouth every 8 (eight) hours as needed (cough).    [provider]  chlorthalidone (HYGROTON) 25 MG tablet Take 25 mg by mouth daily. 01/01/21   [provider]  colchicine 0.6 MG tablet Take 0.6 mg by mouth daily  as needed.    [provider]  DULoxetine (CYMBALTA) 60 MG capsule Take 60 mg by mouth daily.    [provider]  gabapentin (NEURONTIN) 100 MG capsule Take 1 capsule (100 mg total) by mouth 2 (two) times daily. 07/09/21 08/31/48  Barton Dubois, MD  guaiFENesin (MUCINEX) 600 MG 12 hr tablet Take by mouth 2 (two) times daily.    [provider]  hydrocortisone 1 % ointment Apply 1 application topically 2 (two) times daily. Apply to itchy rash on abd and (R) breast twice daily until resolved    [provider]  insulin lispro (HUMALOG) 100 UNIT/ML  KwikPen Inject 5 Units into the skin 2 (two) times daily with a meal.    [provider]  LANTUS SOLOSTAR 100 UNIT/ML Solostar Pen Inject 15 Units into the skin at bedtime. 09/04/21   [provider]  LORazepam (ATIVAN) 2 MG/ML injection Inject 0.5 mLs (1 mg total) into the vein every 15 (fifteen) minutes as needed. 09/23/21   Gerlene Fee, NP  melatonin 3 MG TABS tablet Take 3 mg by mouth at bedtime.    [provider]  metoCLOPramide (REGLAN) 5 MG tablet Take 2.5 mg by mouth 4 (four) times daily.    [provider]  NON FORMULARY Diet - NAS, Cons CHO    [provider]  omeprazole (PRILOSEC) 40 MG capsule Take 40 mg by mouth daily.    [provider]  rOPINIRole (REQUIP) 1 MG tablet Take 1 mg by mouth at bedtime.    [provider]  tiZANidine (ZANAFLEX) 2 MG tablet Take 2 mg by mouth every 6 (six) hours as needed for muscle spasms (for back and sciatic pain).    [provider]      Allergies    Ace inhibitors, Codeine, and Sulfa antibiotics    Review of Systems   Review of Systems  Constitutional:  Negative for fever.  Gastrointestinal:  Negative for abdominal pain and vomiting.  Neurological:  Positive for seizures, weakness (chronic) and headaches.   Physical Exam Updated Vital Signs BP 130/63    Pulse 66    Temp 98 F (36.7 C)    Resp 14    Ht 5\' 2"  (1.575 m)    Wt 74.3 kg    SpO2 98%    BMI 29.98 kg/m  Physical Exam Vitals and nursing note reviewed.  Constitutional:      General: She is not in acute distress.    Appearance: She is well-developed. She is not ill-appearing or diaphoretic.  HENT:     Head: Normocephalic and atraumatic.  Eyes:     Extraocular Movements: Extraocular movements intact.     Pupils: Pupils are equal, round, and reactive to light.  Cardiovascular:     Rate and Rhythm: Normal rate and regular rhythm.     Heart sounds: Normal heart sounds.  Pulmonary:     Effort: Pulmonary  effort is normal.     Breath sounds: Normal breath sounds.  Abdominal:     General: There is no distension.     Palpations: Abdomen is soft.     Tenderness: There is no abdominal tenderness.  Musculoskeletal:     Cervical back: Normal range of motion. No rigidity.  Skin:    General: Skin is warm and dry.     Findings: Rash present.     Comments: There is a slightly darkened irregular patch of skin on left flank. A little rougher than rest  of skin. No vesicles  Neurological:     Mental Status: She is alert and oriented to person, place, and time.     Comments: Oriented to person, place, time, situation. No facial droop/slurred speech. Left arm significantly weaker than right. Both legs can contract but not move otherwise on stretcher    ED Results / Procedures / Treatments   Labs (all labs ordered are listed, but only abnormal results are displayed) Labs Reviewed  COMPREHENSIVE METABOLIC PANEL - Abnormal; Notable for the following components:      Result Value   Sodium 133 (*)    CO2 19 (*)    Glucose, Bld 160 (*)    BUN 57 (*)    Creatinine, Ser 1.33 (*)    GFR, Estimated 40 (*)    All other components within normal limits  CBC WITH DIFFERENTIAL/PLATELET - Abnormal; Notable for the following components:   RBC 2.93 (*)    Hemoglobin 9.0 (*)    HCT 27.9 (*)    Eosinophils Absolute 0.9 (*)    All other components within normal limits  CBG MONITORING, ED - Abnormal; Notable for the following components:   Glucose-Capillary 148 (*)    All other components within normal limits  MAGNESIUM    EKG EKG Interpretation  Date/Time:  Sunday September 28 2021 15:45:28 EST Ventricular Rate:  71 PR Interval:  150 QRS Duration: 99 QT Interval:  397 QTC Calculation: 432 R Axis:   46 Text Interpretation: Sinus rhythm no acute ST/T changes similar to Sep 14 2021 Confirmed by Sherwood Gambler [phone]) on 09/28/2021 4:18:41 PM  Radiology CT HEAD WO CONTRAST  Result Date:  09/28/2021 CLINICAL DATA:  Headaches, seizures EXAM: CT HEAD WITHOUT CONTRAST TECHNIQUE: Contiguous axial images were obtained from the base of the skull through the vertex without intravenous contrast. RADIATION DOSE REDUCTION: This exam was performed according to the departmental dose-optimization program which includes automated exposure control, adjustment of the mA and/or kV according to patient size and/or use of iterative reconstruction technique. COMPARISON:  09/14/2021 FINDINGS: Brain: No acute intracranial findings are seen. There are no signs of bleeding within the cranium. Ventricles are not dilated. There is no focal mass effect. Vascular: Arterial calcifications are seen. Skull: Unremarkable. Sinuses/Orbits: Mucous retention cysts are seen in the maxillary sinuses. There is interval clearing of air-fluid level in the left side of sphenoid sinus with residual mucosal thickening. There is fluid density in the mastoid air cells on both sides, more so on the left side. Other: None IMPRESSION: No acute intracranial findings are seen in noncontrast CT brain. Chronic sinusitis.  Bilateral mastoid effusions. Electronically Signed   By: Elmer Picker M.D.   On: 09/28/2021 16:38    Procedures Procedures    Medications Ordered in ED Medications  levETIRAcetam (KEPPRA) IVPB 1500 mg/ 100 mL premix (0 mg Intravenous Stopped 09/28/21 1809)    ED Course/ Medical Decision Making/ A&P                           Medical Decision Making Amount and/or Complexity of Data Reviewed Labs: ordered. Radiology: ordered.  Risk OTC drugs. Prescription drug management.   Patient is currently stable, and has had no seizures while in ED. CT head ordered, and I have viewed images. No bleed/acute infarct. Labs show mild acidosis that goes with having seizures. Has stable anemia. Normal WBC. No fevers. Given this, I doubt infection. Last time her foley u/a showed possible UTI  but culture was negative.   ECG  interpreted by me, no acute changes.  Discussed with granddaughter at bedside, patient is at her baseline.   I discussed case with neuro, Dr. Mike Craze. Given no further seizures and she has nursing supervision, it would be reasonable to load her with IV keppra 1500 mg now, and change her keppra to 1000 mg morning and 1500 at night. This has been done. She also has a non -specific itchy rash, will prescribe hydrocortisone cream. Appears stable for d/c  Keppra 1000, 1500 evening         Final Clinical Impression(s) / ED Diagnoses Final diagnoses:  Seizures ([place])    Rx / DC Orders ED Discharge Orders          Ordered    levETIRAcetam (KEPPRA) 500 MG tablet        09/28/21 1802    hydrocortisone 1 % lotion  2 times daily PRN        09/28/21 1802              Sherwood Gambler, MD 09/29/21 [phone]

## 2021-09-28 NOTE — Discharge Instructions (Addendum)
Your Keppra is being increased to 1000 mg in the morning and 1500 mg in the evening.  You are also being prescribed hydrocortisone cream to use on the rash on your abdomen/back.

## 2021-09-29 ENCOUNTER — Telehealth: Payer: Self-pay | Admitting: *Deleted

## 2021-09-29 ENCOUNTER — Encounter: Payer: Self-pay | Admitting: Adult Health

## 2021-09-29 ENCOUNTER — Non-Acute Institutional Stay (SKILLED_NURSING_FACILITY): Payer: Medicare HMO | Admitting: Adult Health

## 2021-09-29 DIAGNOSIS — R569 Unspecified convulsions: Secondary | ICD-10-CM

## 2021-09-29 DIAGNOSIS — R030 Elevated blood-pressure reading, without diagnosis of hypertension: Secondary | ICD-10-CM | POA: Diagnosis not present

## 2021-09-29 DIAGNOSIS — M4802 Spinal stenosis, cervical region: Secondary | ICD-10-CM | POA: Diagnosis not present

## 2021-09-29 NOTE — Progress Notes (Signed)
Location:  [facility] Room Number: 154-W Place of Service:  SNF (31)   CODE STATUS: DNR  Allergies  Allergen Reactions   Ace Inhibitors Other (See Comments)    Hyperkalemia    Codeine    Sulfa Antibiotics     Chief Complaint  Patient presents with   Acute Visit    ED follow-up    HPI:  She has had a intractable seizures she was given ativan 1 mg IM for 3 doses. She was taken to the ED where her seizure self resolved. Her workup was negative. She did return back to this facility. She denies any further seizure activity. Her keppra dose was increased in the ED.    Past Medical History:  Diagnosis Date   Anemia    Anxiety    Atherosclerosis of aorta (HCC)    Central cord syndrome at C4 level of cervical spinal cord, subsequent encounter ([place])    CKD (chronic kidney disease)    stage 3   Depression    DM type 2 with diabetic peripheral neuropathy (HCC)    Dysphagia    GERD (gastroesophageal reflux disease)    Gout    High cholesterol    HTN (hypertension)    Hyponatremia    MDD (major depressive disorder)    Neck pain    Neuropathy    Quadriplegia, C1-C4 incomplete (HCC)    Radiculopathy    RLS (restless legs syndrome)    Urinary retention     Past Surgical History:  Procedure Laterality Date   ABDOMINAL HYSTERECTOMY     ANTERIOR CERVICAL DECOMP/DISCECTOMY FUSION N/A 02/05/2021   Procedure: Cervical Three-Four  Anterior cervical decompression/discectomy/fusion;  Surgeon: Ashok Pall, MD;  Location: [facility];  Service: Neurosurgery;  Laterality: N/A;  RM 20   APPENDECTOMY     BACK SURGERY     BIOPSY  09/22/2021   Procedure: BIOPSY;  Surgeon: [name], DO;  Location: [facility];  Service: Endoscopy;;   CERVICAL DISC SURGERY     ESOPHAGOGASTRODUODENOSCOPY (EGD) WITH PROPOFOL N/A 09/22/2021   Procedure: ESOPHAGOGASTRODUODENOSCOPY (EGD) WITH PROPOFOL;  Surgeon: [name], DO;  Location: [facility];  Service: Endoscopy;   Laterality: N/A;  1:00pm   HAND SURGERY     KNEE SURGERY      Social History   Socioeconomic History   Marital status: Widowed    Spouse name: Not on file   Number of children: Not on file   Years of education: Not on file   Highest education level: Not on file  Occupational History   Not on file  Tobacco Use   Smoking status: Never   Smokeless tobacco: Never  Vaping Use   Vaping Use: Never used  Substance and Sexual Activity   Alcohol use: Never   Drug use: Never   Sexual activity: Never  Other Topics Concern   Not on file  Social History Narrative   Not on file   Social Determinants of Health   Financial Resource Strain: Not on file  Food Insecurity: Not on file  Transportation Needs: Not on file  Physical Activity: Not on file  Stress: Not on file  Social Connections: Not on file  Intimate Partner Violence: Not on file   Family History  Problem Relation Age of Onset   Cancer Mother    Cardiomyopathy Father    Colon cancer Neg Hx       VITAL SIGNS BP 117/83    Pulse 69  Temp 97.9 F (36.6 C)    Resp 18    Ht 5\' 2"  (1.575 m)    Wt 163 lb 14.4 oz (74.3 kg)    SpO2 95%    BMI 29.98 kg/m   Outpatient Encounter Medications as of 09/29/2021  Medication Sig   acetaminophen (TYLENOL) 325 MG tablet Take 650 mg by mouth every 8 (eight) hours.   albuterol (VENTOLIN HFA) 108 (90 Base) MCG/ACT inhaler Inhale 1 puff into the lungs every 4 (four) hours as needed for wheezing or shortness of breath.   AMBULATORY NON FORMULARY MEDICATION Medication Name:  Continue monthly catheter changes with 62fr catheter at skilled nursing facility. (Patient taking differently: Medication Name:  Continue monthly catheter changes with 67fr catheter at skilled nursing facility.)   Artificial Saliva (BIOTENE MOISTURIZING MOUTH MT) Use as directed 1 application in the mouth or throat at bedtime. 9 pm   aspirin EC 81 MG tablet Take 81 mg by mouth daily. Swallow whole.9 am   atorvastatin  (LIPITOR) 20 MG tablet Take 1 tablet (20 mg total) by mouth every evening.   Balsam [place]-Castor Oil (VENELEX) OINT Apply topically. Apply to sacrum, coccyx, bilateral buttocks qshift for prevention.   BENZONATATE PO Take 100 mg by mouth every 8 (eight) hours as needed (cough).   chlorthalidone (HYGROTON) 25 MG tablet Take 25 mg by mouth daily.   colchicine 0.6 MG tablet Take 0.6 mg by mouth daily as needed.   DULoxetine (CYMBALTA) 60 MG capsule Take 60 mg by mouth daily.   gabapentin (NEURONTIN) 100 MG capsule Take 1 capsule (100 mg total) by mouth 2 (two) times daily.   guaiFENesin (MUCINEX) 600 MG 12 hr tablet Take by mouth 2 (two) times daily.   hydrocortisone 1 % ointment Apply 1 application topically 2 (two) times daily. Apply to itchy rash on abd and (R) breast twice daily until resolved   insulin lispro (HUMALOG) 100 UNIT/ML KwikPen Inject 5 Units into the skin 2 (two) times daily with a meal.   lamoTRIgine (LAMICTAL) 25 MG tablet Take 12.5 mg by mouth daily. DX: Unspecified convulsions   LANTUS SOLOSTAR 100 UNIT/ML Solostar Pen Inject 15 Units into the skin at bedtime.   levETIRAcetam (KEPPRA) 500 MG tablet 1000 mg in the morning and 1500 mg in the evening   LORazepam (ATIVAN) 2 MG/ML injection Inject 0.5 mLs (1 mg total) into the vein every 15 (fifteen) minutes as needed.   melatonin 3 MG TABS tablet Take 3 mg by mouth at bedtime.   metoCLOPramide (REGLAN) 5 MG tablet Take 2.5 mg by mouth 4 (four) times daily.   mirtazapine (REMERON) 7.5 MG tablet Take 7.5 mg by mouth at bedtime.   NON FORMULARY Diet - NAS, Cons CHO   omeprazole (PRILOSEC) 40 MG capsule Take 40 mg by mouth daily.   rOPINIRole (REQUIP) 1 MG tablet Take 1 mg by mouth at bedtime.   tiZANidine (ZANAFLEX) 2 MG tablet Take 2 mg by mouth every 6 (six) hours as needed for muscle spasms (for back and sciatic pain).   hydrocortisone 1 % lotion Apply 1 application topically 2 (two) times daily as needed for itching.   No  facility-administered encounter medications on file as of 09/29/2021.     SIGNIFICANT DIAGNOSTIC EXAMS  PREVIOUS   12-02-20; ct of head: No acute intracranial abnormality. Polypoid sinus disease.  12-02-20: ct of cervical spine:  Postoperative and degenerative changes in the cervical spine. No acute bony abnormality.  12-02-20; ct of pelvis:  1.  No evidence of acute fracture or dislocation of the pelvis or hips. 2. 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 3. Degenerative changes in the hips with subcortical cysts on the left hip.  12-02-20: ct of left knee:  1. Subtle acute nondisplaced fracture through the medial aspect of the head of the fibula. 2. 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 3. There is a small joint effusion.  12-02-20: mri left hip:  1. No acute findings involving the left hip. 2. Mild chronic bilateral hip avascular necrosis. Moderate degenerative hip findings bilaterally. No hip effusion or regional bursitis. 3. Mild left greater than right hamstring tendinopathy. 4. 1.3 cm left eccentric Bartholin's cyst or Gartner cyst.  12-02-20: mri lumbar spine:  1. 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 2. L3-4: Bilateral posterolateral disc bulges, more prominent on the left. Mild left foraminal encroachment that could possibly affect the left L3 nerve. Moderate to severe bilateral facet osteoarthritis. These findings could relate to back pain or referred facet syndrome pain. 3. L4-5: Bilateral facet arthropathy with 2 mm of anterolisthesis. Bulging of the disc more prominent towards the right. Mild narrowing of the lateral recesses and of the intervertebral foramen on the right, but without visible neural compression. Findings at this level could relate to back pain or referred facet syndrome pain. 4. L5-S1: Previous left hemilaminectomy. Endplate osteophytes and bulging of the disc more  prominent towards the left. Facet degeneration and hypertrophy left more than right with left foraminal stenosis and subarticular lateral recess stenosis could cause left-sided neural compression.   01-25-21: ct of head; maxillofacial cervical spine:  1. No acute intracranial pathology. 2. No acute/traumatic cervical spine pathology. 3. No acute facial bone fractures.  01-26-21: ct of chest abdomen and pelvis:  1. No noncontrast CT evidence of acute traumatic injury to the chest, abdomen, or pelvis. 2. Distended urinary bladder. Mild bilateral hydronephrosis and hydroureter to the bilateral ureterovesicular junctions. No obstructing calculus or other etiology identified. Correlate for urinary retention. 3. No fracture or dislocation of the lumbar spine. Moderate disc space height loss and osteophytosis at L5-S1. Probable small broad-based posterior disc bulges at L4-L5 and L5-S1. 4. Coronary artery disease. Aortic Atherosclerosis  01-26-21: ct of right shoulder:  1.  No acute osseous injury of the right shoulder. 2. Mild mineralization in the infraspinatus tendon as can be seen with calcific tendinosis.  01-29-21: MRI of spine:  Postoperative and multilevel degenerative changes of the cervical spine. There is severe canal stenosis with cord compression at C3-C4. Abnormal cord signal is present just below this level (at operative levels) and may reflect edema or myelomalacia No significant degenerative changes of the thoracic spine. Multilevel degenerative changes of lumbar spine similar to recent prior study.  01-30-21: renal ultrasound:  1. No acute abnormality. Small amount of right perinephric fluid, nonspecific.  02-05-21: chest x-ray:  1. Right internal jugular central venous catheter with tip over the mid SVC. No pneumothorax. 2. Hazy lung base opacities likely represent combination of atelectasis and pleural fluid. Vascular congestion.   03-06-21 chest x-ray: left lower lobe infiltrate  likely representing pneumonia   03-26-21: lumbar x-ray: mild to moderate degenerative changes in lumbar spine  03-27-21; DEXA scan: t score -1.777  06-22-21: ct of abdomen and pelvis:  Left colonic diverticulosis.  No active diverticulitis. Bilateral hydronephrosis to the level of the bladder. No obstructing stones. Urinary bladder is distended with Foley catheter in place. Aortic atherosclerosis.  07-06-21: ct head:  No  intracranial abnormality or other acute findings. Chronic bilateral maxillary sinus disease.  07-06-21: ct of abdomen and pelvis:  1. Heterogeneous enhancement of the left kidney with mild left perinephric edema, suspicious for pyelonephritis. There is also mild bladder wall thickening and perivesicular fat stranding with Foley catheter in place. Improvement in bilateral hydronephrosis from prior exam. 2. Stool distends the rectum at 7.6 cm with mild distal rectal wall thickening, suspicious for fecal impaction. 3. Colonic diverticulosis without diverticulitis. Aortic Atherosclerosis   07-07-21: MRI of head:  No acute intracranial finding. No sign of acute infarction. No lesion seen to explain seizure. The patient does have mild chronic small-vessel ischemic changes considering age affecting the pons, thalami and cerebral hemispheric white matter. Left mastoid effusion  09-14-21: ct of head:  No acute intracranial abnormality. 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 New left sphenoid sinus air-fluid level, which may be seen with acute sinusitis.  TODAY  09-28-21: ct of head No acute intracranial findings are seen in noncontrast CT brain. Chronic sinusitis.  Bilateral mastoid effusions.      LABS REVIEWED PREVIOUS   12-02-20: wbc 6.4; hgb 11.3; hct 34.4; mcv 93.0 plt 216; glucose 209; bun 33; creat 1.13; k+ 4.3; na++ 137; ca 9.0; GFR 49; hgb a1c 8.6 12-03-20: wbc 6.5; hgb 9.8; hct 30.5 mcv 93.8 plt 182; glucose 188;bun 41; creat 1.49; k+ 4.4; na++ 134; ca 8.2; GFR  35 12-04-20; glucose 285; bun 29; creat 1.00; k+ 4.3; na++ 138; ca 8.7 GFR 56  01-25-21: wbc 6.4; hgb 10.6; hct 31.7; mcv 91.1 plt 227; glucose 152; bun 40; creat 1.2 k+ 4.2; na++ 135; ca 8.7; GFR 42; liver normal albumin 3.5 01-29-21: urine and blood culture: klebsiella pneumoniae 01-31-21: wbc 13.1; hgb 8.3; hct 23.6; mcv 88.1 plt 153; glucose 162; bun 67; creat 2.77; k+ 4.8; na++ 125; ca 7.5; GFR 17; ast 62; alt 57; albumin 1.7 02-04-21: wbc 9.8; hgb 7.6; hct 22.0; mcv 85.3 plt 212; glucose 161 bun 40; creat 1.40; k+ 3.4; na++ 127; ca 7.5; GFR 38; ast 62; alt 57; albumin 1.7  02-08-21: wbc 10.4; hgb 9.2; hct 27.6; mcv 87.3 plt 433; glucose 186; bun 23; creat 1.00 ;k+ 4.4; na++ 131; ca 8.1; GFR 56; ast 60; alt 61; albumin 2.1  02-20-21: hgb 9.4; hct 29.9 glucose 154; bun 51; creat 1.17; k+ 4.5; na++ 136; ca 8.9; GFR 47 02-24-21: glucose 175; bun 62; creat 1.13; k+ 4.5; na++ 133; ca 9.0 GFR 49 03-06-21: wbc 5.3; hgb 8.8; hct 27.1; mcv 93.4 plt 262; glucose 453; bun 66; creat 1.30; k+ 4.6; na++ 135; ca 9.0; GFR 41; d-dimer: 2.16; CRP 3.0  03-13-21: d-dimer: 1.49 03-20-21: hep C nr; d-dimer 0.86 03-27-21: d-dimer 0.52 03-31-21: wbc 5.5; hgb 8.5; hct 26.8; mcv 93.4 plt 203; hgb a1c 7.6; chol 130; ldl 68; trig 138; hdl 34; urine micro-albumin 25.1 06-12-21: uric acid 5.9 06-15-21: wbc 6.0; hgb 9.8; hct 29.4; mcv 90,7 plt 242; glucose 187; bun 65; creat 1.33; k+ 4.2; na++ 131; ca 8.9; GFR 40; liver normal albumin 3.3  06-19-21: wbc 7.3; hgb 9.9; hct 30.1; mcv 89,9 plt 306; glucose 115; bun 29; creat 1.05; k+ 4.1; na++ 131; ca 8.7; GFR 53; liver normal albumin 3.1; urine micro-albumin 85.3 06-22-21; wbc 11.8; hgb 10.4; hct 30.9; mcv 89,3 plt 375; glucose 288; bun 44; creat 1.46; k+ 3.5; na++ 125; ca 8.3 GFR 36; liver normal albumin 3.3; lipase 37  07-06-21: wbc 7.6; hgb 9.7; hct 29.8; mcv 89,2 plt 409; glucose 92;  bun 44; creat 1.09; k+ 5,3l na++ 136; ca 9.4; GFR 51; liver normal albumin 3.4  Hgb A1C: 8.3; urine culture:  proteus mirabilis; + 30,000 colonies klebsiella pneumoniae  08-29-21: wbc 5.6; hgb 9.0; hct 27.6; mcv 94.2 plt 244; glucose 106; bun 80; creat 1.35; k+ 5.8; na++ 134; ca 9.0; GFR 39 liver normal albumin 3.5 vit B 12: 446; folate 7.7; iron 79 tibc 280; ferritin 178 09-04-21: glucose 66; bun 79; creat 1.44; k+ 5.5; na++ 135; ca 9.2; GFR 36 09-14-21: wbc 6.2; hgb 8.9; hct 27.1; mcv 96.1 plt 217; glucose 171; bun 58; creat 1.69; k+ 5.8; na++ 134; ca 8.7; GFR 30; urine culture: multiple species 09-15-20: k+ 6.3 09-16-21: k+ 5.0   TODAY  09-28-21: wbc 6.0; hgb 9.2; hct 27.9; mcv 95.2 plt 252; glucose 160; bun 57; creat 1.33; k+ 5.1; na++ 133; ca 8.9; GFR 40; mag 1.8; liver normal albumin 3.8     Review of Systems  Constitutional:  Negative for malaise/fatigue.  Respiratory:  Negative for cough and shortness of breath.   Cardiovascular:  Negative for chest pain, palpitations and leg swelling.  Gastrointestinal:  Negative for abdominal pain, constipation and heartburn.  Musculoskeletal:  Negative for back pain, joint pain and myalgias.  Skin: Negative.   Neurological:  Negative for dizziness.  Psychiatric/Behavioral:  The patient is not nervous/anxious.    Physical Exam Constitutional:      General: She is not in acute distress.    Appearance: She is well-developed. She is not diaphoretic.  Neck:     Thyroid: No thyromegaly.  Cardiovascular:     Rate and Rhythm: Normal rate and regular rhythm.     Pulses: Normal pulses.     Heart sounds: Normal heart sounds.  Pulmonary:     Effort: Pulmonary effort is normal. No respiratory distress.     Breath sounds: Normal breath sounds.  Abdominal:     General: Bowel sounds are normal. There is no distension.     Palpations: Abdomen is soft.     Tenderness: There is no abdominal tenderness.  Genitourinary:    Comments: Foley  Musculoskeletal:     Cervical back: Neck supple.     Right lower leg: No edema.     Left lower leg: No edema.     Comments:  Does not move legs; able to move upper extremities  Lymphadenopathy:     Cervical: No cervical adenopathy.  Skin:    General: Skin is warm and dry.  Neurological:     Mental Status: She is alert. Mental status is at baseline.  Psychiatric:        Mood and Affect: Mood normal.      ASSESSMENT/ PLAN:  TODAY  Seizures: will continue keppra 100 mg in the AM and 1500 mg in the PM will continue lamictal 12.5 mg daily has prn ativan and will continue to monitor her status,    [name] NP [facility]  call [phone]

## 2021-09-29 NOTE — Telephone Encounter (Signed)
Transition Care Management Unsuccessful Follow-up Telephone Call  Date of discharge and from where:  09/29/2021 [facility]  Attempts:  1st Attempt  Reason for unsuccessful TCM follow-up call:  Unable to reach patient [facility]

## 2021-09-30 DIAGNOSIS — M6281 Muscle weakness (generalized): Secondary | ICD-10-CM | POA: Diagnosis not present

## 2021-09-30 DIAGNOSIS — R279 Unspecified lack of coordination: Secondary | ICD-10-CM | POA: Diagnosis not present

## 2021-09-30 DIAGNOSIS — G8252 Quadriplegia, C1-C4 incomplete: Secondary | ICD-10-CM | POA: Diagnosis not present

## 2021-10-01 DIAGNOSIS — G8252 Quadriplegia, C1-C4 incomplete: Secondary | ICD-10-CM | POA: Diagnosis not present

## 2021-10-01 DIAGNOSIS — R279 Unspecified lack of coordination: Secondary | ICD-10-CM | POA: Diagnosis not present

## 2021-10-01 DIAGNOSIS — M6281 Muscle weakness (generalized): Secondary | ICD-10-CM | POA: Diagnosis not present

## 2021-10-02 DIAGNOSIS — Z1159 Encounter for screening for other viral diseases: Secondary | ICD-10-CM | POA: Diagnosis not present

## 2021-10-02 DIAGNOSIS — G8252 Quadriplegia, C1-C4 incomplete: Secondary | ICD-10-CM | POA: Diagnosis not present

## 2021-10-03 ENCOUNTER — Other Ambulatory Visit: Payer: Self-pay | Admitting: Adult Health

## 2021-10-03 MED ORDER — LORAZEPAM 2 MG/ML IJ SOLN: 1.0000 mg | INTRAMUSCULAR | 0 refills | Status: DC | PRN

## 2021-10-07 ENCOUNTER — Encounter: Payer: Self-pay | Admitting: Neurology

## 2021-10-07 ENCOUNTER — Ambulatory Visit (INDEPENDENT_AMBULATORY_CARE_PROVIDER_SITE_OTHER): Payer: Medicare HMO | Admitting: Neurology

## 2021-10-07 VITALS — BP 125/78 | HR 70 | Ht 62.0 in | Wt 165.0 lb

## 2021-10-07 DIAGNOSIS — G40909 Epilepsy, unspecified, not intractable, without status epilepticus: Secondary | ICD-10-CM

## 2021-10-07 NOTE — Patient Instructions (Signed)
We will continue with your seizure medications for now at the current doses.   Please monitor your itching and talk to your primary care about seeing a dermatologist. I do not see an obvious rash, you have some dry patches.   Please continue to hydrate well with water.   We will do an EEG (brainwave test), which we will schedule. We will call you with the results.  Please see one of our nurse practitioners in about 3 months.

## 2021-10-07 NOTE — Progress Notes (Signed)
Subjective:    Patient ID: [name] is a 86 y.o. female.  HPI    Star Age, MD, PhD [facility] [address], [address] [place], [place] [address]  Dear Dr. Christella Noa,   I saw your patient, [name], upon your kind request, in my neurologic clinic today for initial consultation of her seizure disorder.  The patient is accompanied by her daughter today.  As you know, [name] is an 86 year old right-handed woman with an underlying complex medical history of chronic kidney disease, central cord syndrome diabetes, gout, reflux disease, hypertension, hyperlipidemia, hyponatremia, depression, anemia, overweight state, anxiety, status post multiple surgeries including neck surgery, low back surgery, knee surgery, hand surgery, hysterectomy, appendectomy, who reports a longstanding history of seizures.  She also reports a family history of seizures affecting her sister who had seizures starting at age 56 years old.  Patient reports that she was on Dilantin in the past.  She had recurrence of seizures approximately last year as I understand. I reviewed your office note from 07/28/2021, at which time she saw Fenton Malling, NP.  Of note, she is on multiple medications including lorazepam as needed, atorvastatin, Biotene as needed, chlorthalidone, colchicine, Cymbalta, gabapentin, Humalog, Lantus, melatonin, mirtazapine, omeprazole, Reglan, ropinirole, tizanidine as needed.  She is on Ventolin as needed. Per daughter, she does not have any tonic-clonic seizures, her seizures consist of involuntary mouth movements, decreased consciousness, or loss of awareness, lipsmacking, and drawing of her arms upwards above her head. She is wheelchair-bound and since last year has not been able to walk.  She has an indwelling catheter.  Per daughter, she hydrates well with water.  She has limited caffeine, 1 cup of coffee in the mornings typically.  She does not drink any  alcohol.  She resides at [facility], skilled nursing facility.  I had evaluated her over 2 years ago at the request of Dr. Redmond Baseman in ENT for her balance problems and gait disorder.  She has since then missed 2 appointments.  She has been seen by Dr. Merlene Laughter in the interim in 2021 but office notes were not available for my review.  She was recently seen in the emergency room on 09/28/2021 with seizures.  She presented from the [facility].  She received multiple doses of Ativan.  I reviewed the emergency room records.  She had a head CT without contrast on 09/28/2021 and I reviewed the results: Impression: No acute intracranial findings are seen in noncontrast CT brain.  Chronic sinusitis.  Bilateral mastoid effusions.  She had a head CT without contrast on 09/14/2021 with the indication of seizure today, altered mental status.  I reviewed the results: Impression: No acute intracranial abnormality.  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  New left sphenoid sinus air-fluid level, which may be seen with acute sinusitis.  She had a brain MRI with and without contrast on 07/07/2021 with indication of possible seizure.  Abnormal neurological exam.  I reviewed the results: Impression: No acute intracranial finding. No sign of acute infarction. No lesion seen to explain seizure. The patient does have mild chronic small-vessel ischemic changes considering age affecting the pons, thalami and cerebral hemispheric white matter. Left mastoid effusion. She has been on Keppra and Lamictal.  The Lamictal was started in late January 2023 by her primary care.  In the emergency room on 09/28/2021 she was given IV Keppra 1500 mg x 1.  She was advised to continue with Keppra and Lamictal, Keppra  was increased to 1000 mg in the morning and 1500 mg in the evening.  Urinalysis was nonsuspicious for a UTI.  Laboratory evaluation showed a magnesium level of 1.8, sodium mildly low at 133, potassium 5.1, glucose 160, BUN 57, creatinine 1.33,  CBC showed WBC of 6.2, hemoglobin was low at 9, hematocrit low at 27.9.  Repeat blood sugar 148. She missed interim appointments on appointment on 01/02/20 and 01/30/20.  Previously:   08/17/19: (She) reports problems with her balance for the past 2 years.  She reports that she was diagnosed with diabetes in the 70s and she started noticing occasional problems with walking straight, she would at times severe in one direction.  In the recent past she has had 2 falls.  She was in the home both times.  She fell backwards recently and hit her head against the corner of the furnace she says.  She had bent down to pick something up and when she stood up she overshot.  This happened on 07/02/2019.  On 1123, she fell in the bathroom after standing up from the toilet commode.  She does have occasional lightheadedness when standing up quickly.  She has a 4 pronged cane available and a walker from when she had back and neck surgeries but has not been using either.  She hydrates well with water, estimates that she drinks 3-4 bottles of water per day and up to 2 bottles per night.  She lives alone, she is widowed, she has  4 grown children.  She has a call alert button.  She is a non-smoker and does not drink alcohol and does not drink caffeine on a daily basis.  Her latest A1c was either 8 or 9 or 11 per her recollection.  She had blood work through her primary care physician in October.  I reviewed your office note from 07/06/2019.  She denies any recurrent headaches or vertiginous symptoms such as spinning sensation or nausea.  She denies any sudden onset of one-sided weakness or numbness or tingling or droopy face or slurring of speech.  She has had intermittent tingling sensation in her feet and left hand.  Her Past Medical History Is Significant For: Past Medical History:  Diagnosis Date   Adult failure to thrive    Anemia    Anxiety    Atherosclerosis of aorta (HCC)    Central cord syndrome at C4 level of cervical  spinal cord, subsequent encounter ([place])    CKD (chronic kidney disease)    stage 3   Depression    DM type 2 with diabetic peripheral neuropathy (HCC)    Dysphagia    GERD (gastroesophageal reflux disease)    Gout    High cholesterol    HTN (hypertension)    Hyponatremia    MDD (major depressive disorder)    Neck pain    Neuropathy    Quadriplegia, C1-C4 incomplete (HCC)    Radiculopathy    RLS (restless legs syndrome)    Urinary retention     Her Past Surgical History Is Significant For: Past Surgical History:  Procedure Laterality Date   ABDOMINAL HYSTERECTOMY     ANTERIOR CERVICAL DECOMP/DISCECTOMY FUSION N/A 02/05/2021   Procedure: Cervical Three-Four  Anterior cervical decompression/discectomy/fusion;  Surgeon: Ashok Pall, MD;  Location: [facility];  Service: Neurosurgery;  Laterality: N/A;  RM 20   APPENDECTOMY     BACK SURGERY     BIOPSY  09/22/2021   Procedure: BIOPSY;  Surgeon: [name], DO;  [facility]: [facility];  Service: Endoscopy;;   CERVICAL DISC SURGERY     ESOPHAGOGASTRODUODENOSCOPY (EGD) WITH PROPOFOL N/A 09/22/2021   Procedure: ESOPHAGOGASTRODUODENOSCOPY (EGD) WITH PROPOFOL;  Surgeon: [name], DO;  [facility]: [facility];  Service: Endoscopy;  Laterality: N/A;  1:00pm   HAND SURGERY     KNEE SURGERY      Her Family History Is Significant For: Family History  Problem Relation Age of Onset   Cancer Mother    Cardiomyopathy Father    Seizures Sister        childhood   Seizures Brother        in his 69s   Colon cancer Neg Hx     Her Social History Is Significant For: Social History   Socioeconomic History   Marital status: Widowed    Spouse name: Not on file   Number of children: Not on file   Years of education: Not on file   Highest education level: Not on file  Occupational History   Not on file  Tobacco Use   Smoking status: Never   Smokeless tobacco: Never  Vaping Use   Vaping Use: Never used  Substance and Sexual  Activity   Alcohol use: Never   Drug use: Never   Sexual activity: Never  Other Topics Concern   Not on file  Social History Narrative   Lives at [facility]   Social Determinants of Health   Financial Resource Strain: Not on file  Food Insecurity: Not on file  Transportation Needs: Not on file  Physical Activity: Not on file  Stress: Not on file  Social Connections: Not on file    Her Allergies Are:  Allergies  Allergen Reactions   Ace Inhibitors Other (See Comments)    Hyperkalemia    Codeine    Sulfa Antibiotics   :   Her Current Medications Are:  Outpatient Encounter Medications as of 10/07/2021  Medication Sig   acetaminophen (TYLENOL) 325 MG tablet Take 650 mg by mouth every 8 (eight) hours.   albuterol (VENTOLIN HFA) 108 (90 Base) MCG/ACT inhaler Inhale 1 puff into the lungs every 4 (four) hours as needed for wheezing or shortness of breath.   AMBULATORY NON FORMULARY MEDICATION Medication Name:  Continue monthly catheter changes with 41fr catheter at skilled nursing facility. (Patient taking differently: Medication Name:  Continue monthly catheter changes with 52fr catheter at skilled nursing facility.)   Artificial Saliva (BIOTENE MOISTURIZING MOUTH MT) Use as directed 1 application in the mouth or throat at bedtime. 9 pm   aspirin EC 81 MG tablet Take 81 mg by mouth daily. Swallow whole.9 am   atorvastatin (LIPITOR) 20 MG tablet Take 1 tablet (20 mg total) by mouth every evening.   Balsam [place]-Castor Oil (VENELEX) OINT Apply topically. Apply to sacrum, coccyx, bilateral buttocks qshift for prevention.   BENZONATATE PO Take 100 mg by mouth every 8 (eight) hours as needed (cough).   chlorthalidone (HYGROTON) 25 MG tablet Take 25 mg by mouth daily.   colchicine 0.6 MG tablet Take 0.6 mg by mouth daily as needed.   DULoxetine (CYMBALTA) 60 MG capsule Take 60 mg by mouth daily.   gabapentin (NEURONTIN) 100 MG capsule Take 1 capsule (100 mg total) by mouth 2  (two) times daily.   guaiFENesin (MUCINEX) 600 MG 12 hr tablet Take by mouth 2 (two) times daily.   hydrocortisone 1 % lotion Apply 1 application topically 2 (two) times daily as needed for itching.  hydrocortisone 1 % ointment Apply 1 application topically 2 (two) times daily. Apply to itchy rash on abd and (R) breast twice daily until resolved   insulin lispro (HUMALOG) 100 UNIT/ML KwikPen Inject 5 Units into the skin 2 (two) times daily with a meal.   lamoTRIgine (LAMICTAL) 25 MG tablet Take 12.5 mg by mouth daily. DX: Unspecified convulsions   LANTUS SOLOSTAR 100 UNIT/ML Solostar Pen Inject 15 Units into the skin at bedtime.   levETIRAcetam (KEPPRA) 500 MG tablet 1000 mg in the morning and 1500 mg in the evening   LORazepam (ATIVAN) 2 MG/ML injection Inject 0.5 mLs (1 mg total) into the vein every 15 (fifteen) minutes as needed.   melatonin 3 MG TABS tablet Take 3 mg by mouth at bedtime.   metoCLOPramide (REGLAN) 5 MG tablet Take 2.5 mg by mouth 4 (four) times daily.   mirtazapine (REMERON) 7.5 MG tablet Take 7.5 mg by mouth at bedtime.   omeprazole (PRILOSEC) 40 MG capsule Take 40 mg by mouth daily.   rOPINIRole (REQUIP) 1 MG tablet Take 1 mg by mouth at bedtime.   tiZANidine (ZANAFLEX) 2 MG tablet Take 2 mg by mouth every 6 (six) hours as needed for muscle spasms (for back and sciatic pain).   NON FORMULARY Diet - NAS, Cons CHO   No facility-administered encounter medications on file as of 10/07/2021.  :   Review of Systems:  Out of a complete 14 point review of systems, all are reviewed and negative with the exception of these symptoms as listed below:  Review of Systems  Neurological:        Patient is here with her daughter, Lynnett Langlinais, for seizures. Patient states she has had them since 10th grade. Although they were better for years and was on Dilantin. Daughter states she would "fall out" when they were children and they had to call their dad to get the patient up. As they got  older they learned how to do it themselves. She resides at [facility] center. She was living alone but in May 2022 she missed a step off her daughter's deck, fell and was injured. This is what led to her being at [facility]. She recently started having seizures again about 2 months ago. She is on Keppra and Lamictal. Also on Gabapentin but states this is supposed to be discontinued. Has Ativan PRN.    Objective:  Neurological Exam  Physical Exam Physical Examination:   Vitals:   10/07/21 1409  BP: 125/78  Pulse: 70    General Examination: The patient is a very pleasant 86 y.o. female in no acute distress.  She appears frail and deconditioned.  She is in a wheelchair.    HEENT: Normocephalic, atraumatic, pupils are equal, round and reactive to light, extraocular tracking is fairly well-preserved, face is symmetric, no facial masking, no nuchal rigidity.  Mouth is moderately dry.  Tongue protrudes centrally, palate elevates symmetrically, speech without dysarthria or hypophonia.  Hearing is grossly intact.  Chest: Clear to auscultation without wheezing, rhonchi or crackles noted.  Heart: S1+S2+0, regular and normal without murmurs, rubs or gallops noted.   Abdomen: Soft, non-tender and non-distended with normal bowel sounds appreciated on auscultation.  Daughter reports that she has a rash, abdominal skin does not show any telltale rash, she has dry patches, some redness noted.  Extremities: There is mild edema in the distal lower extremities bilaterally.   Skin: Warm and dry without trophic changes noted.   Musculoskeletal: exam reveals very  limited mobility in all 4 extremities.   Neurologically:  Mental status: The patient is awake, alert and oriented in all 4 spheres. Her immediate and remote memory, attention, language skills and fund of knowledge are mildly impaired, details of her history are provided by her daughter.   Cranial nerves II - XII are as described above under HEENT exam.   Motor exam: Normal to thin bulk, global strength impaired in all 4 extremities, upper extremities about 4 out of 5 and lower extremity strength about 3 out of 5.  I did not have her attempt to stand, she does not typically stand or walk.   Cerebellar testing: No dysmetria or intention tremor.  Sensory exam: intact to light touch in the upper and lower extremities.                Assessment and Plan:   In summary, RAEANA BLINN is a very pleasant 86 y.o.-year old female 86 year old right-handed woman with an underlying complex medical history of chronic kidney disease, central cord syndrome diabetes, gout, reflux disease, hypertension, hyperlipidemia, hyponatremia, depression, anemia, overweight state, anxiety, status post multiple surgeries including neck surgery, low back surgery, knee surgery, hand surgery, hysterectomy, appendectomy, who presents for evaluation of her seizure disorder.  She reports a history of seizures starting in her teenage years.  She reports a family history of seizures affecting her younger sister since childhood.  Description of her seizures are not in keeping with generalized tonic-clonic seizures.  She has been on Keppra and Lamictal, I would like to maintain her current AED regimen.  She is on Keppra 1000 mg in the morning and 1500 mg in the evening, this was recently increased after she was seen in the emergency room and she also received a bolus.  She has been on Lamictal 25 mg daily.  There is room to increase this if the need arises.  She is advised to get evaluated by a dermatologist or talk to her primary care nurse practitioner about seeing a dermatologist for itching in her skin and a suspected rash but she does not have an obvious drug rash on my examination today.  She is advised to continue with good hydration and good nutrition.  We will proceed with an EEG as an outpatient.  She is agreeable to pursuing this.  We can make adjustments to the medication regimen as  needed and we will call with the EEG results in the interim.  She is advised to follow-up in this clinic to see one of our nurse practitioners in about 3 months, sooner if needed.  I answered all their questions today and the patient and her daughter were in agreement.   Thank you very much for allowing me to participate in the care of this nice patient. If I can be of any further assistance to you please do not hesitate to call me at [phone].  Sincerely,   Star Age, MD, PhD I spent 40 minutes in total face-to-face time and in reviewing records during pre-charting, more than 50% of which was spent in counseling and coordination of care, reviewing test results, reviewing medications and treatment regimen and/or in discussing or reviewing the diagnosis of seizure disorder, the prognosis and treatment options. Pertinent laboratory and imaging test results that were available during this visit with the patient were reviewed by me and considered in my medical decision making (see chart for details).

## 2021-10-09 DIAGNOSIS — Z1159 Encounter for screening for other viral diseases: Secondary | ICD-10-CM | POA: Diagnosis not present

## 2021-10-09 DIAGNOSIS — G8252 Quadriplegia, C1-C4 incomplete: Secondary | ICD-10-CM | POA: Diagnosis not present

## 2021-10-16 ENCOUNTER — Inpatient Hospital Stay (INDEPENDENT_AMBULATORY_CARE_PROVIDER_SITE_OTHER): Payer: Medicare HMO | Admitting: Neurology

## 2021-10-16 DIAGNOSIS — G8252 Quadriplegia, C1-C4 incomplete: Secondary | ICD-10-CM | POA: Diagnosis not present

## 2021-10-16 DIAGNOSIS — Z1159 Encounter for screening for other viral diseases: Secondary | ICD-10-CM | POA: Diagnosis not present

## 2021-10-16 DIAGNOSIS — G40909 Epilepsy, unspecified, not intractable, without status epilepticus: Secondary | ICD-10-CM

## 2021-10-16 NOTE — Procedures (Signed)
° ° °  History:  86 year old woman with history of seizure  EEG classification: Awake and drowsy  Description of the recording: The background rhythms of this recording consists of a fairly well modulated medium amplitude alpha rhythm of 9 Hz that is reactive to eye opening and closure. As the record progresses, the patient appears to remain in the waking state throughout the recording. Photic stimulation was performed, did not show any abnormalities. Hyperventilation was not performed. Toward the end of the recording, the patient enters the drowsy state with slight symmetric slowing seen. The patient never enters stage II sleep. No abnormal epileptiform discharges seen during this recording. There was no focal slowing. EKG monitor shows no evidence of cardiac rhythm abnormalities with a heart rate of 72.  Impression: This is a normal EEG recording in the waking and drowsy state. No evidence of interictal epileptiform discharges seen. A normal EEG does not exclude a diagnosis of epilepsy.    Alric Ran, MD [facility]

## 2021-10-17 ENCOUNTER — Other Ambulatory Visit (HOSPITAL_COMMUNITY)
Admission: RE | Admit: 2021-10-17 | Discharge: 2021-10-17 | Disposition: A | Discharge status: Home / Self Care | Payer: Medicare HMO | Source: Skilled Nursing Facility | Attending: Internal Medicine | Admitting: Internal Medicine

## 2021-10-17 DIAGNOSIS — Z03818 Encounter for observation for suspected exposure to other biological agents ruled out: Secondary | ICD-10-CM | POA: Insufficient documentation

## 2021-10-17 LAB — CBC
HCT: 26.6 % — ABNORMAL LOW (ref 36.0–46.0)
Hemoglobin: 8.9 g/dL — ABNORMAL LOW (ref 12.0–15.0)
MCH: 31.9 pg (ref 26.0–34.0)
MCHC: 33.5 g/dL (ref 30.0–36.0)
MCV: 95.3 fL (ref 80.0–100.0)
Platelets: 223 10*3/uL (ref 150–400)
RBC: 2.79 MIL/uL — ABNORMAL LOW (ref 3.87–5.11)
RDW: 14.4 % (ref 11.5–15.5)
WBC: 5.9 10*3/uL (ref 4.0–10.5)
nRBC: 0 % (ref 0.0–0.2)

## 2021-10-17 LAB — BASIC METABOLIC PANEL
Anion gap: 11 (ref 5–15)
BUN: 44 mg/dL — ABNORMAL HIGH (ref 8–23)
CO2: 17 mmol/L — ABNORMAL LOW (ref 22–32)
Calcium: 8.6 mg/dL — ABNORMAL LOW (ref 8.9–10.3)
Chloride: 106 mmol/L (ref 98–111)
Creatinine, Ser: 1.31 mg/dL — ABNORMAL HIGH (ref 0.44–1.00)
GFR, Estimated: 40 mL/min — ABNORMAL LOW (ref >60)
Glucose, Bld: 171 mg/dL — ABNORMAL HIGH (ref 70–99)
Potassium: 4.8 mmol/L (ref 3.5–5.1)
Sodium: 134 mmol/L — ABNORMAL LOW (ref 135–145)

## 2021-10-17 LAB — D-DIMER, QUANTITATIVE: D-Dimer, Quant: 1.21 ug/mL-FEU — ABNORMAL HIGH (ref 0.00–0.50)

## 2021-10-18 DIAGNOSIS — U071 COVID-19: Secondary | ICD-10-CM | POA: Diagnosis not present

## 2021-10-18 LAB — C-REACTIVE PROTEIN: CRP: 3.2 mg/dL — ABNORMAL HIGH (ref <1.0)

## 2021-10-20 ENCOUNTER — Non-Acute Institutional Stay (SKILLED_NURSING_FACILITY): Payer: Medicare HMO | Admitting: Adult Health

## 2021-10-20 ENCOUNTER — Encounter: Payer: Self-pay | Admitting: Adult Health

## 2021-10-20 DIAGNOSIS — E119 Type 2 diabetes mellitus without complications: Secondary | ICD-10-CM | POA: Diagnosis not present

## 2021-10-20 DIAGNOSIS — U071 COVID-19: Secondary | ICD-10-CM

## 2021-10-20 DIAGNOSIS — R7982 Elevated C-reactive protein (CRP): Secondary | ICD-10-CM

## 2021-10-20 DIAGNOSIS — E1142 Type 2 diabetes mellitus with diabetic polyneuropathy: Secondary | ICD-10-CM | POA: Diagnosis not present

## 2021-10-20 NOTE — Progress Notes (Signed)
Location:  [facility] Room Number: 154-W Place of Service:  SNF (31)   CODE STATUS: DNR  Allergies  Allergen Reactions   Ace Inhibitors Other (See Comments)    Hyperkalemia    Codeine    Sulfa Antibiotics     Chief Complaint  Patient presents with   Acute Visit    COVID positive     HPI:  She has tested positive for covid. She does complain of poor appetite. The numbness and tingling in her hands has returned since stopping the gabapentin. She would like to restart this medication. She is willing to try it on a prn basis. She feels her appetite is getting worse since weaning off remeron. She is willing to restart this medication. She does have itchy skin. There are no reports of fevers; has chronic cough; no shortness of breath.   Past Medical History:  Diagnosis Date   Adult failure to thrive    Anemia    Anxiety    Atherosclerosis of aorta (HCC)    Central cord syndrome at C4 level of cervical spinal cord, subsequent encounter ([place])    CKD (chronic kidney disease)    stage 3   Depression    DM type 2 with diabetic peripheral neuropathy (HCC)    Dysphagia    GERD (gastroesophageal reflux disease)    Gout    High cholesterol    HTN (hypertension)    Hyponatremia    MDD (major depressive disorder)    Neck pain    Neuropathy    Quadriplegia, C1-C4 incomplete (HCC)    Radiculopathy    RLS (restless legs syndrome)    Urinary retention     Past Surgical History:  Procedure Laterality Date   ABDOMINAL HYSTERECTOMY     ANTERIOR CERVICAL DECOMP/DISCECTOMY FUSION N/A 02/05/2021   Procedure: Cervical Three-Four  Anterior cervical decompression/discectomy/fusion;  Surgeon: Ashok Pall, MD;  Location: [facility];  Service: Neurosurgery;  Laterality: N/A;  RM 20   APPENDECTOMY     BACK SURGERY     BIOPSY  09/22/2021   Procedure: BIOPSY;  Surgeon: [name], DO;  [facility]: [facility];  Service: Endoscopy;;   CERVICAL DISC SURGERY      ESOPHAGOGASTRODUODENOSCOPY (EGD) WITH PROPOFOL N/A 09/22/2021   Procedure: ESOPHAGOGASTRODUODENOSCOPY (EGD) WITH PROPOFOL;  Surgeon: [name], DO;  [facility];  Service: Endoscopy;  Laterality: N/A;  1:00pm   HAND SURGERY     KNEE SURGERY      Social History   Socioeconomic History   Marital status: Widowed    Spouse name: Not on file   Number of children: Not on file   Years of education: Not on file   Highest education level: Not on file  Occupational History   Not on file  Tobacco Use   Smoking status: Never   Smokeless tobacco: Never  Vaping Use   Vaping Use: Never used  Substance and Sexual Activity   Alcohol use: Never   Drug use: Never   Sexual activity: Never  Other Topics Concern   Not on file  Social History Narrative   Lives at [facility]   Social Determinants of Health   Financial Resource Strain: Not on file  Food Insecurity: Not on file  Transportation Needs: Not on file  Physical Activity: Not on file  Stress: Not on file  Social Connections: Not on file  Intimate Partner Violence: Not on file   Family History  Problem  Relation Age of Onset   Cancer Mother    Cardiomyopathy Father    Seizures Sister        childhood   Seizures Brother        in his 27s   Colon cancer Neg Hx       VITAL SIGNS BP 126/66    Pulse 68    Temp (!) 97.2 F (36.2 C)    Resp 20    Ht 5\' 2"  (1.575 m)    Wt 165 lb 6.4 oz (75 kg)    SpO2 93%    BMI 30.25 kg/m   Outpatient Encounter Medications as of 10/20/2021  Medication Sig   acetaminophen (TYLENOL) 325 MG tablet Take 650 mg by mouth every 8 (eight) hours.   albuterol (VENTOLIN HFA) 108 (90 Base) MCG/ACT inhaler Inhale 1 puff into the lungs every 4 (four) hours as needed for wheezing or shortness of breath.   AMBULATORY NON FORMULARY MEDICATION Medication Name:  Continue monthly catheter changes with 69fr catheter at skilled nursing facility. (Patient taking differently: Medication Name:   Continue monthly catheter changes with 63fr catheter at skilled nursing facility.)   Artificial Saliva (BIOTENE MOISTURIZING MOUTH MT) Use as directed 1 application in the mouth or throat at bedtime. 9 pm   aspirin EC 81 MG tablet Take 81 mg by mouth daily. Swallow whole.9 am   atorvastatin (LIPITOR) 20 MG tablet Take 1 tablet (20 mg total) by mouth every evening.   Balsam [place]-Castor Oil (VENELEX) OINT Apply topically. Apply to sacrum, coccyx, bilateral buttocks qshift for prevention.   BENZONATATE PO Take 100 mg by mouth every 8 (eight) hours as needed (cough).   chlorthalidone (HYGROTON) 25 MG tablet Take 25 mg by mouth daily.   colchicine 0.6 MG tablet Take 0.6 mg by mouth daily as needed.   DULoxetine (CYMBALTA) 60 MG capsule Take 60 mg by mouth daily.   ergocalciferol (VITAMIN D2) 1.25 MG (50000 UT) capsule Take 50,000 Units by mouth once a week.   gabapentin (NEURONTIN) 100 MG capsule Take 1 capsule (100 mg total) by mouth 2 (two) times daily.   guaiFENesin (MUCINEX) 600 MG 12 hr tablet Take by mouth 2 (two) times daily.   Homeopathic Products (ZINC) LOZG (with A and C) Lozenges (vit a-vit c-zinc-propolis) lozenge; - ; oral Special Instructions: Give 5x/day x 2 weeks. 5 Times Per Day   hydrocortisone 1 % ointment Apply 1 application topically 2 (two) times daily. Apply to itchy rash on abd and (R) breast twice daily until resolved   insulin lispro (HUMALOG) 100 UNIT/ML KwikPen Inject 5 Units into the skin 2 (two) times daily with a meal.   lamoTRIgine (LAMICTAL) 25 MG tablet Take 12.5 mg by mouth daily. DX: Unspecified convulsions   LANTUS SOLOSTAR 100 UNIT/ML Solostar Pen Inject 15 Units into the skin at bedtime.   levETIRAcetam (KEPPRA) 500 MG tablet 1000 mg in the morning and 1500 mg in the evening   LORazepam (ATIVAN) 2 MG/ML injection Inject 0.5 mLs (1 mg total) into the vein every 15 (fifteen) minutes as needed.   melatonin 3 MG TABS tablet Take 3 mg by mouth at bedtime.    metoCLOPramide (REGLAN) 5 MG tablet Take 2.5 mg by mouth 4 (four) times daily.   mirtazapine (REMERON) 7.5 MG tablet Take 7.5 mg by mouth at bedtime.   molnupiravir EUA (LAGEVRIO) 200 MG CAPS capsule Take 4 capsules by mouth 2 (two) times daily.   NON FORMULARY Diet - NAS, Cons CHO  omeprazole (PRILOSEC) 40 MG capsule Take 40 mg by mouth daily.   predniSONE (DELTASONE) 20 MG tablet Take 20 mg by mouth daily. for CRP 3.2   rOPINIRole (REQUIP) 1 MG tablet Take 1 mg by mouth at bedtime.   tiZANidine (ZANAFLEX) 2 MG tablet Take 2 mg by mouth every 6 (six) hours as needed for muscle spasms (for back and sciatic pain).   vitamin C (ASCORBIC ACID) 500 MG tablet Take 500 mg by mouth 2 (two) times daily.   [DISCONTINUED] hydrocortisone 1 % lotion Apply 1 application topically 2 (two) times daily as needed for itching.   No facility-administered encounter medications on file as of 10/20/2021.     SIGNIFICANT DIAGNOSTIC EXAMS  PREVIOUS   12-02-20; ct of head: No acute intracranial abnormality. Polypoid sinus disease.  12-02-20: ct of cervical spine:  Postoperative and degenerative changes in the cervical spine. No acute bony abnormality.  12-02-20; ct of pelvis:  1. No evidence of acute fracture or dislocation of the pelvis or hips. 2. 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 3. Degenerative changes in the hips with subcortical cysts on the left hip.  12-02-20: ct of left knee:  1. Subtle acute nondisplaced fracture through the medial aspect of the head of the fibula. 2. 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 3. There is a small joint effusion.  12-02-20: mri left hip:  1. No acute findings involving the left hip. 2. Mild chronic bilateral hip avascular necrosis. Moderate degenerative hip findings bilaterally. No hip effusion or regional bursitis. 3. Mild left greater than right hamstring tendinopathy. 4. 1.3 cm left  eccentric Bartholin's cyst or Gartner cyst.  12-02-20: mri lumbar spine:  1. 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 2. L3-4: Bilateral posterolateral disc bulges, more prominent on the left. Mild left foraminal encroachment that could possibly affect the left L3 nerve. Moderate to severe bilateral facet osteoarthritis. These findings could relate to back pain or referred facet syndrome pain. 3. L4-5: Bilateral facet arthropathy with 2 mm of anterolisthesis. Bulging of the disc more prominent towards the right. Mild narrowing of the lateral recesses and of the intervertebral foramen on the right, but without visible neural compression. Findings at this level could relate to back pain or referred facet syndrome pain. 4. L5-S1: Previous left hemilaminectomy. Endplate osteophytes and bulging of the disc more prominent towards the left. Facet degeneration and hypertrophy left more than right with left foraminal stenosis and subarticular lateral recess stenosis could cause left-sided neural compression.   01-25-21: ct of head; maxillofacial cervical spine:  1. No acute intracranial pathology. 2. No acute/traumatic cervical spine pathology. 3. No acute facial bone fractures.  01-26-21: ct of chest abdomen and pelvis:  1. No noncontrast CT evidence of acute traumatic injury to the chest, abdomen, or pelvis. 2. Distended urinary bladder. Mild bilateral hydronephrosis and hydroureter to the bilateral ureterovesicular junctions. No obstructing calculus or other etiology identified. Correlate for urinary retention. 3. No fracture or dislocation of the lumbar spine. Moderate disc space height loss and osteophytosis at L5-S1. Probable small broad-based posterior disc bulges at L4-L5 and L5-S1. 4. Coronary artery disease. Aortic Atherosclerosis  01-26-21: ct of right shoulder:  1.  No acute osseous injury of the right shoulder. 2. Mild mineralization in the infraspinatus tendon as can be seen with  calcific tendinosis.  01-29-21: MRI of spine:  Postoperative and multilevel degenerative changes of the cervical spine. There is severe canal stenosis with cord compression at C3-C4.  Abnormal cord signal is present just below this level (at operative levels) and may reflect edema or myelomalacia No significant degenerative changes of the thoracic spine. Multilevel degenerative changes of lumbar spine similar to recent prior study.  01-30-21: renal ultrasound:  1. No acute abnormality. Small amount of right perinephric fluid, nonspecific.  02-05-21: chest x-ray:  1. Right internal jugular central venous catheter with tip over the mid SVC. No pneumothorax. 2. Hazy lung base opacities likely represent combination of atelectasis and pleural fluid. Vascular congestion.   03-06-21 chest x-ray: left lower lobe infiltrate likely representing pneumonia   03-26-21: lumbar x-ray: mild to moderate degenerative changes in lumbar spine  03-27-21; DEXA scan: t score -1.777  06-22-21: ct of abdomen and pelvis:  Left colonic diverticulosis.  No active diverticulitis. Bilateral hydronephrosis to the level of the bladder. No obstructing stones. Urinary bladder is distended with Foley catheter in place. Aortic atherosclerosis.  07-06-21: ct head:  No intracranial abnormality or other acute findings. Chronic bilateral maxillary sinus disease.  07-06-21: ct of abdomen and pelvis:  1. Heterogeneous enhancement of the left kidney with mild left perinephric edema, suspicious for pyelonephritis. There is also mild bladder wall thickening and perivesicular fat stranding with Foley catheter in place. Improvement in bilateral hydronephrosis from prior exam. 2. Stool distends the rectum at 7.6 cm with mild distal rectal wall thickening, suspicious for fecal impaction. 3. Colonic diverticulosis without diverticulitis. Aortic Atherosclerosis   07-07-21: MRI of head:  No acute intracranial finding. No sign of acute  infarction. No lesion seen to explain seizure. The patient does have mild chronic small-vessel ischemic changes considering age affecting the pons, thalami and cerebral hemispheric white matter. Left mastoid effusion  09-14-21: ct of head:  No acute intracranial abnormality. 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 New left sphenoid sinus air-fluid level, which may be seen with acute sinusitis.  TODAY  09-28-21: ct of head No acute intracranial findings are seen in noncontrast CT brain. Chronic sinusitis.  Bilateral mastoid effusions.      LABS REVIEWED PREVIOUS   12-02-20: wbc 6.4; hgb 11.3; hct 34.4; mcv 93.0 plt 216; glucose 209; bun 33; creat 1.13; k+ 4.3; na++ 137; ca 9.0; GFR 49; hgb a1c 8.6 12-03-20: wbc 6.5; hgb 9.8; hct 30.5 mcv 93.8 plt 182; glucose 188;bun 41; creat 1.49; k+ 4.4; na++ 134; ca 8.2; GFR 35 12-04-20; glucose 285; bun 29; creat 1.00; k+ 4.3; na++ 138; ca 8.7 GFR 56  01-25-21: wbc 6.4; hgb 10.6; hct 31.7; mcv 91.1 plt 227; glucose 152; bun 40; creat 1.2 k+ 4.2; na++ 135; ca 8.7; GFR 42; liver normal albumin 3.5 01-29-21: urine and blood culture: klebsiella pneumoniae 01-31-21: wbc 13.1; hgb 8.3; hct 23.6; mcv 88.1 plt 153; glucose 162; bun 67; creat 2.77; k+ 4.8; na++ 125; ca 7.5; GFR 17; ast 62; alt 57; albumin 1.7 02-04-21: wbc 9.8; hgb 7.6; hct 22.0; mcv 85.3 plt 212; glucose 161 bun 40; creat 1.40; k+ 3.4; na++ 127; ca 7.5; GFR 38; ast 62; alt 57; albumin 1.7  02-08-21: wbc 10.4; hgb 9.2; hct 27.6; mcv 87.3 plt 433; glucose 186; bun 23; creat 1.00 ;k+ 4.4; na++ 131; ca 8.1; GFR 56; ast 60; alt 61; albumin 2.1  02-20-21: hgb 9.4; hct 29.9 glucose 154; bun 51; creat 1.17; k+ 4.5; na++ 136; ca 8.9; GFR 47 02-24-21: glucose 175; bun 62; creat 1.13; k+ 4.5; na++ 133; ca 9.0 GFR 49 03-06-21: wbc 5.3; hgb 8.8; hct 27.1; mcv 93.4 plt 262; glucose 453; bun 66;  creat 1.30; k+ 4.6; na++ 135; ca 9.0; GFR 41; d-dimer: 2.16; CRP 3.0  03-13-21: d-dimer: 1.49 03-20-21: hep C nr; d-dimer 0.86 03-27-21:  d-dimer 0.52 03-31-21: wbc 5.5; hgb 8.5; hct 26.8; mcv 93.4 plt 203; hgb a1c 7.6; chol 130; ldl 68; trig 138; hdl 34; urine micro-albumin 25.1 06-12-21: uric acid 5.9 06-15-21: wbc 6.0; hgb 9.8; hct 29.4; mcv 90,7 plt 242; glucose 187; bun 65; creat 1.33; k+ 4.2; na++ 131; ca 8.9; GFR 40; liver normal albumin 3.3  06-19-21: wbc 7.3; hgb 9.9; hct 30.1; mcv 89,9 plt 306; glucose 115; bun 29; creat 1.05; k+ 4.1; na++ 131; ca 8.7; GFR 53; liver normal albumin 3.1; urine micro-albumin 85.3 06-22-21; wbc 11.8; hgb 10.4; hct 30.9; mcv 89,3 plt 375; glucose 288; bun 44; creat 1.46; k+ 3.5; na++ 125; ca 8.3 GFR 36; liver normal albumin 3.3; lipase 37  07-06-21: wbc 7.6; hgb 9.7; hct 29.8; mcv 89,2 plt 409; glucose 92; bun 44; creat 1.09; k+ 5,3l na++ 136; ca 9.4; GFR 51; liver normal albumin 3.4  Hgb A1C: 8.3; urine culture: proteus mirabilis; + 30,000 colonies klebsiella pneumoniae  08-29-21: wbc 5.6; hgb 9.0; hct 27.6; mcv 94.2 plt 244; glucose 106; bun 80; creat 1.35; k+ 5.8; na++ 134; ca 9.0; GFR 39 liver normal albumin 3.5 vit B 12: 446; folate 7.7; iron 79 tibc 280; ferritin 178 09-04-21: glucose 66; bun 79; creat 1.44; k+ 5.5; na++ 135; ca 9.2; GFR 36 09-14-21: wbc 6.2; hgb 8.9; hct 27.1; mcv 96.1 plt 217; glucose 171; bun 58; creat 1.69; k+ 5.8; na++ 134; ca 8.7; GFR 30; urine culture: multiple species 09-15-20: k+ 6.3 09-16-21: k+ 5.0  09-28-21: wbc 6.0; hgb 9.2; hct 27.9; mcv 95.2 plt 252; glucose 160; bun 57; creat 1.33; k+ 5.1; na++ 133; ca 8.9; GFR 40; mag 1.8; liver normal albumin 3.8   TODAY  10-17-21: wbc 5.9; hgb 8.9; hct 26.6; mcv 95.3 plt 223; glucose 171; bun 44; creat 1.31;k+ 4.8; na++ 134; ca 8.6; gfr 40. D-dimer 1.21; crp 3.2     Review of Systems  Constitutional:  Negative for malaise/fatigue.  Respiratory:  Negative for cough and shortness of breath.   Cardiovascular:  Negative for chest pain, palpitations and leg swelling.  Gastrointestinal:  Negative for abdominal pain, constipation  and heartburn.  Musculoskeletal:  Negative for back pain, joint pain and myalgias.  Skin:  Positive for itching.  Neurological:  Positive for tingling. Negative for dizziness.  Psychiatric/Behavioral:  The patient is not nervous/anxious.    Physical Exam Constitutional:      General: She is not in acute distress.    Appearance: She is well-developed. She is not diaphoretic.  Neck:     Thyroid: No thyromegaly.  Cardiovascular:     Rate and Rhythm: Normal rate and regular rhythm.     Pulses: Normal pulses.     Heart sounds: Normal heart sounds.  Pulmonary:     Effort: Pulmonary effort is normal. No respiratory distress.     Breath sounds: Normal breath sounds.  Abdominal:     General: Bowel sounds are normal. There is no distension.     Palpations: Abdomen is soft.     Tenderness: There is no abdominal tenderness.  Genitourinary:    Comments: foley Musculoskeletal:     Cervical back: Neck supple.     Right lower leg: No edema.     Left lower leg: No edema.     Comments:  Does not move legs; able to move  upper extremities   Lymphadenopathy:     Cervical: No cervical adenopathy.  Skin:    General: Skin is warm and dry.  Neurological:     Mental Status: She is alert. Mental status is at baseline.  Psychiatric:        Mood and Affect: Mood normal.       ASSESSMENT/ PLAN:  TODAY  Covid 19 with comorbidity type 2 diabetes  Elevated crp Diabetic peripheral neuropathy  Will complete covid regimen Will return remeron 7.5 mg nightly has failed one wean Will restart gabapentin 100 mg twice daily as needed Will begin prednisone 20 mg daily for 5 days Will check crp and d-dimer on 11-03-21    [name] NP [facility]   call [phone]

## 2021-10-21 ENCOUNTER — Non-Acute Institutional Stay (SKILLED_NURSING_FACILITY): Payer: Medicare HMO | Admitting: Adult Health

## 2021-10-21 ENCOUNTER — Encounter: Payer: Self-pay | Admitting: Adult Health

## 2021-10-21 DIAGNOSIS — K219 Gastro-esophageal reflux disease without esophagitis: Secondary | ICD-10-CM

## 2021-10-21 DIAGNOSIS — M87052 Idiopathic aseptic necrosis of left femur: Secondary | ICD-10-CM

## 2021-10-21 DIAGNOSIS — E1122 Type 2 diabetes mellitus with diabetic chronic kidney disease: Secondary | ICD-10-CM

## 2021-10-21 DIAGNOSIS — M51369 Other intervertebral disc degeneration, lumbar region without mention of lumbar back pain or lower extremity pain: Secondary | ICD-10-CM

## 2021-10-21 DIAGNOSIS — I129 Hypertensive chronic kidney disease with stage 1 through stage 4 chronic kidney disease, or unspecified chronic kidney disease: Secondary | ICD-10-CM

## 2021-10-21 DIAGNOSIS — K5909 Other constipation: Secondary | ICD-10-CM | POA: Diagnosis not present

## 2021-10-21 DIAGNOSIS — Z66 Do not resuscitate: Secondary | ICD-10-CM

## 2021-10-21 DIAGNOSIS — M87051 Idiopathic aseptic necrosis of right femur: Secondary | ICD-10-CM

## 2021-10-21 DIAGNOSIS — N1831 Chronic kidney disease, stage 3a: Secondary | ICD-10-CM | POA: Diagnosis not present

## 2021-10-21 DIAGNOSIS — M5136 Other intervertebral disc degeneration, lumbar region: Secondary | ICD-10-CM | POA: Diagnosis not present

## 2021-10-21 NOTE — Progress Notes (Signed)
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 There are no reports of uncontrolled pain. She is presently being treated for covid. There are no reports of fever present. No reports of changes in appetite  Past Medical History:  Diagnosis Date   Adult failure to thrive    Anemia    Anxiety    Atherosclerosis of aorta (HCC)    Central cord syndrome at C4 level of cervical spinal cord, subsequent encounter ([place])    CKD (chronic kidney disease)    stage 3   Depression    DM type 2 with diabetic peripheral neuropathy (HCC)    Dysphagia    GERD (gastroesophageal reflux disease)    Gout    High cholesterol    HTN (hypertension)    Hyponatremia    MDD (major depressive disorder)    Neck pain    Neuropathy    Quadriplegia, C1-C4 incomplete (HCC)    Radiculopathy    RLS (restless legs syndrome)    Urinary retention     Past Surgical History:  Procedure Laterality Date   ABDOMINAL HYSTERECTOMY     ANTERIOR CERVICAL DECOMP/DISCECTOMY FUSION N/A 02/05/2021   Procedure: Cervical Three-Four  Anterior cervical decompression/discectomy/fusion;  Surgeon: Ashok Pall, MD;  Location: [facility];  Service: Neurosurgery;  Laterality: N/A;  RM 20   APPENDECTOMY      BACK SURGERY     BIOPSY  09/22/2021   Procedure: BIOPSY;  Surgeon: [name], DO;  [facility];  Service: Endoscopy;;   CERVICAL DISC SURGERY     ESOPHAGOGASTRODUODENOSCOPY (EGD) WITH PROPOFOL N/A 09/22/2021   Procedure: ESOPHAGOGASTRODUODENOSCOPY (EGD) WITH PROPOFOL;  Surgeon: [name], DO;  [facility];  Service: Endoscopy;  Laterality: N/A;  1:00pm   HAND SURGERY     KNEE SURGERY      Social History   Socioeconomic History   Marital status: Widowed    Spouse name: Not on file   Number of children: Not on file   Years of education: Not on file   Highest education level: Not on file  Occupational History   Not on file  Tobacco Use   Smoking status: Never   Smokeless tobacco: Never  Vaping Use   Vaping Use: Never used  Substance and Sexual Activity   Alcohol use: Never   Drug use: Never   Sexual activity: Never  Other Topics Concern   Not on file  Social History Narrative   Lives at [facility]   Social Determinants of Health   Financial Resource Strain: Not on file  Food Insecurity: Not on file  Transportation Needs: Not on file  Physical Activity: Not on file  Stress: Not on file  Social Connections: Not on file  Intimate Partner Violence: Not on file   Family History  Problem Relation Age of Onset   Cancer Mother    Cardiomyopathy Father    Seizures Sister        childhood   Seizures Brother        in his 59s   Colon cancer Neg Hx       VITAL SIGNS BP 140/70    Pulse 73    Temp 98.1 F (36.7 C)    Resp 20    Ht 5\' 2"  (1.575 m)    Wt 165 lb 6.4 oz (75 kg)    SpO2 95%    BMI 30.25 kg/m   Outpatient Encounter Medications as of 10/21/2021  Medication Sig   acetaminophen (TYLENOL) 325 MG tablet Take 650 mg by mouth every 8 (eight) hours.   albuterol (VENTOLIN HFA) 108 (90 Base) MCG/ACT inhaler Inhale 1 puff into the lungs every 4 (four) hours as needed for wheezing or shortness of breath.   Artificial Saliva  (BIOTENE MOISTURIZING MOUTH MT) Use as directed 1 application in the mouth or throat at bedtime. 9 pm   aspirin EC 81 MG tablet Take 81 mg by mouth daily. Swallow whole.9 am   atorvastatin (LIPITOR) 20 MG tablet Take 1 tablet (20 mg total) by mouth every evening.   Balsam [place]-Castor Oil (VENELEX) OINT Apply topically. Apply to sacrum, coccyx, bilateral buttocks qshift for prevention.   BENZONATATE PO Take 100 mg by mouth every 8 (eight) hours as needed (cough).   chlorthalidone (HYGROTON) 25 MG tablet Take 25 mg by mouth daily.   colchicine 0.6 MG tablet Take 0.6 mg by mouth daily as needed.   DULoxetine (CYMBALTA) 60 MG capsule Take 60 mg by mouth daily.   ergocalciferol (VITAMIN D2) 1.25 MG (50000 UT) capsule Take 50,000 Units by mouth once a week.   gabapentin (NEURONTIN) 100 MG capsule Take 1 capsule (100 mg total) by mouth 2 (two) times daily.   guaiFENesin (MUCINEX) 600 MG 12 hr tablet Take by mouth 2 (two) times daily.   Homeopathic Products (ZINC) LOZG (with A and C) Lozenges (vit a-vit c-zinc-propolis) lozenge; - ; oral Special Instructions: Give 5x/day x 2 weeks. 5 Times Per Day   hydrocortisone 1 % ointment Apply 1 application topically 2 (two) times daily. Apply to itchy rash on abd and (R) breast twice daily until resolved   insulin lispro (HUMALOG) 100 UNIT/ML KwikPen Inject 5 Units into the skin 2 (two) times daily with a meal.   lamoTRIgine (LAMICTAL) 25 MG tablet Take 12.5 mg by mouth daily. DX: Unspecified convulsions   LANTUS SOLOSTAR 100 UNIT/ML Solostar Pen Inject 15 Units into the skin at bedtime.   levETIRAcetam (KEPPRA) 1000 MG tablet Take 1,000 mg by mouth 2 (two) times daily. 9 am and at 9 pm (see other listing for pm total)   levETIRAcetam (KEPPRA) 500 MG tablet Take 500 mg by mouth at bedtime. Along with 1000 mg for a total on 1500 mg in the pm (see other listing)   LORazepam (ATIVAN) 2 MG/ML injection Inject 0.5 mLs (1 mg total) into the vein every 15 (fifteen)  minutes as needed.   melatonin 3 MG TABS tablet Take 3 mg by mouth at bedtime.   metoCLOPramide (REGLAN) 5 MG tablet Take 2.5 mg by mouth 4 (four) times daily.   mirtazapine (REMERON) 7.5 MG tablet Take 7.5 mg by mouth at bedtime.  molnupiravir EUA (LAGEVRIO) 200 MG CAPS capsule Take 4 capsules by mouth 2 (two) times daily.   NON FORMULARY Diet - NAS, Cons CHO   omeprazole (PRILOSEC) 40 MG capsule Take 40 mg by mouth daily.   predniSONE (DELTASONE) 20 MG tablet Take 20 mg by mouth daily. for CRP 3.2   rOPINIRole (REQUIP) 1 MG tablet Take 1 mg by mouth at bedtime.   tiZANidine (ZANAFLEX) 2 MG tablet Take 2 mg by mouth every 6 (six) hours as needed for muscle spasms (for back and sciatic pain).   vitamin C (ASCORBIC ACID) 500 MG tablet Take 500 mg by mouth 2 (two) times daily.   AMBULATORY NON FORMULARY MEDICATION Medication Name:  Continue monthly catheter changes with 37fr catheter at skilled nursing facility. (Patient taking differently: Medication Name:  Continue monthly catheter changes with 30fr catheter at skilled nursing facility.)   [DISCONTINUED] levETIRAcetam (KEPPRA) 500 MG tablet 1000 mg in the morning and 1500 mg in the evening   No facility-administered encounter medications on file as of 10/21/2021.     SIGNIFICANT DIAGNOSTIC EXAMS  PREVIOUS   12-02-20; ct of head: No acute intracranial abnormality. Polypoid sinus disease.  12-02-20: ct of cervical spine:  Postoperative and degenerative changes in the cervical spine. No acute bony abnormality.  12-02-20; ct of pelvis:  1. No evidence of acute fracture or dislocation of the pelvis or hips. 2. 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 3. Degenerative changes in the hips with subcortical cysts on the left hip.  12-02-20: ct of left knee:  1. Subtle acute nondisplaced fracture through the medial aspect of the head of the fibula. 2. Tricompartmental osteoarthritis with  intra-articular loose bodies. 3. There is a small joint effusion.  12-02-20: mri left hip:  1. No acute findings involving the left hip. 2. Mild chronic bilateral hip avascular necrosis. Moderate degenerative hip findings bilaterally. No hip effusion or regional bursitis. 3. Mild left greater than right hamstring tendinopathy. 4. 1.3 cm left eccentric Bartholin's cyst or Gartner cyst.  12-02-20: mri lumbar spine:  1. 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 2. L3-4: Bilateral posterolateral disc bulges, more prominent on the left. Mild left foraminal encroachment that could possibly affect the left L3 nerve. Moderate to severe bilateral facet osteoarthritis. These findings could relate to back pain or referred facet syndrome pain. 3. L4-5: Bilateral facet arthropathy with 2 mm of anterolisthesis. Bulging of the disc more prominent towards the right. Mild narrowing of the lateral recesses and of the intervertebral foramen on the right, but without visible neural compression. Findings at this level could relate to back pain or referred facet syndrome pain. 4. L5-S1: Previous left hemilaminectomy. Endplate osteophytes and bulging of the disc more prominent towards the left. Facet degeneration and hypertrophy left more than right with left foraminal stenosis and subarticular lateral recess stenosis could cause left-sided neural compression.   01-25-21: ct of head; maxillofacial cervical spine:  1. No acute intracranial pathology. 2. No acute/traumatic cervical spine pathology. 3. No acute facial bone fractures.  01-26-21: ct of chest abdomen and pelvis:  1. No noncontrast CT evidence of acute traumatic injury to the chest, abdomen, or pelvis. 2. Distended urinary bladder. Mild bilateral hydronephrosis and hydroureter to the bilateral ureterovesicular junctions. No obstructing calculus or other etiology identified. Correlate for urinary retention. 3. No fracture or dislocation of the  lumbar spine. Moderate disc space height loss and osteophytosis at L5-S1. Probable small broad-based posterior disc bulges at L4-L5  and L5-S1. 4. Coronary artery disease. Aortic Atherosclerosis  01-26-21: ct of right shoulder:  1.  No acute osseous injury of the right shoulder. 2. Mild mineralization in the infraspinatus tendon as can be seen with calcific tendinosis.  01-29-21: MRI of spine:  Postoperative and multilevel degenerative changes of the cervical spine. There is severe canal stenosis with cord compression at C3-C4. Abnormal cord signal is present just below this level (at operative levels) and may reflect edema or myelomalacia No significant degenerative changes of the thoracic spine. Multilevel degenerative changes of lumbar spine similar to recent prior study.  01-30-21: renal ultrasound:  1. No acute abnormality. Small amount of right perinephric fluid, nonspecific.  02-05-21: chest x-ray:  1. Right internal jugular central venous catheter with tip over the mid SVC. No pneumothorax. 2. Hazy lung base opacities likely represent combination of atelectasis and pleural fluid. Vascular congestion.   03-06-21 chest x-ray: left lower lobe infiltrate likely representing pneumonia   03-26-21: lumbar x-ray: mild to moderate degenerative changes in lumbar spine  03-27-21; DEXA scan: t score -1.777  06-22-21: ct of abdomen and pelvis:  Left colonic diverticulosis.  No active diverticulitis. Bilateral hydronephrosis to the level of the bladder. No obstructing stones. Urinary bladder is distended with Foley catheter in place. Aortic atherosclerosis.  07-06-21: ct head:  No intracranial abnormality or other acute findings. Chronic bilateral maxillary sinus disease.  07-06-21: ct of abdomen and pelvis:  1. Heterogeneous enhancement of the left kidney with mild left perinephric edema, suspicious for pyelonephritis. There is also mild bladder wall thickening and perivesicular fat stranding with  Foley catheter in place. Improvement in bilateral hydronephrosis from prior exam. 2. Stool distends the rectum at 7.6 cm with mild distal rectal wall thickening, suspicious for fecal impaction. 3. Colonic diverticulosis without diverticulitis. Aortic Atherosclerosis   07-07-21: MRI of head:  No acute intracranial finding. No sign of acute infarction. No lesion seen to explain seizure. The patient does have mild chronic small-vessel ischemic changes considering age affecting the pons, thalami and cerebral hemispheric white matter. Left mastoid effusion  09-14-21: ct of head:  No acute intracranial abnormality. 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 New left sphenoid sinus air-fluid level, which may be seen with acute sinusitis.  09-28-21: ct of head No acute intracranial findings are seen in noncontrast CT brain. Chronic sinusitis.  Bilateral mastoid effusions.  NO NEW EXAMS       LABS REVIEWED PREVIOUS   12-02-20: wbc 6.4; hgb 11.3; hct 34.4; mcv 93.0 plt 216; glucose 209; bun 33; creat 1.13; k+ 4.3; na++ 137; ca 9.0; GFR 49; hgb a1c 8.6 12-03-20: wbc 6.5; hgb 9.8; hct 30.5 mcv 93.8 plt 182; glucose 188;bun 41; creat 1.49; k+ 4.4; na++ 134; ca 8.2; GFR 35 12-04-20; glucose 285; bun 29; creat 1.00; k+ 4.3; na++ 138; ca 8.7 GFR 56  01-25-21: wbc 6.4; hgb 10.6; hct 31.7; mcv 91.1 plt 227; glucose 152; bun 40; creat 1.2 k+ 4.2; na++ 135; ca 8.7; GFR 42; liver normal albumin 3.5 01-29-21: urine and blood culture: klebsiella pneumoniae 01-31-21: wbc 13.1; hgb 8.3; hct 23.6; mcv 88.1 plt 153; glucose 162; bun 67; creat 2.77; k+ 4.8; na++ 125; ca 7.5; GFR 17; ast 62; alt 57; albumin 1.7 02-04-21: wbc 9.8; hgb 7.6; hct 22.0; mcv 85.3 plt 212; glucose 161 bun 40; creat 1.40; k+ 3.4; na++ 127; ca 7.5; GFR 38; ast 62; alt 57; albumin 1.7  02-08-21: wbc 10.4; hgb 9.2; hct 27.6; mcv 87.3 plt 433; glucose 186; bun  23; creat 1.00 ;k+ 4.4; na++ 131; ca 8.1; GFR 56; ast 60; alt 61; albumin 2.1  02-20-21: hgb 9.4; hct  29.9 glucose 154; bun 51; creat 1.17; k+ 4.5; na++ 136; ca 8.9; GFR 47 02-24-21: glucose 175; bun 62; creat 1.13; k+ 4.5; na++ 133; ca 9.0 GFR 49 03-06-21: wbc 5.3; hgb 8.8; hct 27.1; mcv 93.4 plt 262; glucose 453; bun 66; creat 1.30; k+ 4.6; na++ 135; ca 9.0; GFR 41; d-dimer: 2.16; CRP 3.0  03-13-21: d-dimer: 1.49 03-20-21: hep C nr; d-dimer 0.86 03-27-21: d-dimer 0.52 03-31-21: wbc 5.5; hgb 8.5; hct 26.8; mcv 93.4 plt 203; hgb a1c 7.6; chol 130; ldl 68; trig 138; hdl 34; urine micro-albumin 25.1 06-12-21: uric acid 5.9 06-15-21: wbc 6.0; hgb 9.8; hct 29.4; mcv 90,7 plt 242; glucose 187; bun 65; creat 1.33; k+ 4.2; na++ 131; ca 8.9; GFR 40; liver normal albumin 3.3  06-19-21: wbc 7.3; hgb 9.9; hct 30.1; mcv 89,9 plt 306; glucose 115; bun 29; creat 1.05; k+ 4.1; na++ 131; ca 8.7; GFR 53; liver normal albumin 3.1; urine micro-albumin 85.3 06-22-21; wbc 11.8; hgb 10.4; hct 30.9; mcv 89,3 plt 375; glucose 288; bun 44; creat 1.46; k+ 3.5; na++ 125; ca 8.3 GFR 36; liver normal albumin 3.3; lipase 37  07-06-21: wbc 7.6; hgb 9.7; hct 29.8; mcv 89,2 plt 409; glucose 92; bun 44; creat 1.09; k+ 5,3l na++ 136; ca 9.4; GFR 51; liver normal albumin 3.4  Hgb A1C: 8.3; urine culture: proteus mirabilis; + 30,000 colonies klebsiella pneumoniae  08-29-21: wbc 5.6; hgb 9.0; hct 27.6; mcv 94.2 plt 244; glucose 106; bun 80; creat 1.35; k+ 5.8; na++ 134; ca 9.0; GFR 39 liver normal albumin 3.5 vit B 12: 446; folate 7.7; iron 79 tibc 280; ferritin 178 09-04-21: glucose 66; bun 79; creat 1.44; k+ 5.5; na++ 135; ca 9.2; GFR 36 09-14-21: wbc 6.2; hgb 8.9; hct 27.1; mcv 96.1 plt 217; glucose 171; bun 58; creat 1.69; k+ 5.8; na++ 134; ca 8.7; GFR 30; urine culture: multiple species 09-15-20: k+ 6.3 09-16-21: k+ 5.0  09-28-21: wbc 6.0; hgb 9.2; hct 27.9; mcv 95.2 plt 252; glucose 160; bun 57; creat 1.33; k+ 5.1; na++ 133; ca 8.9; GFR 40; mag 1.8; liver normal albumin 3.8  10-17-21: wbc 5.9; hgb 8.9; hct 26.6; mcv 95.3 plt 223; glucose 171;  bun 44; creat 1.31;k+ 4.8; na++ 134; ca 8.6; gfr 40. D-dimer 1.21; crp 3.2   NO NEW LABS   Review of Systems  Constitutional:  Negative for malaise/fatigue.  Respiratory:  Negative for cough and shortness of breath.   Cardiovascular:  Negative for chest pain, palpitations and leg swelling.  Gastrointestinal:  Negative for abdominal pain, constipation and heartburn.  Musculoskeletal:  Negative for back pain, joint pain and myalgias.  Skin: Negative.   Neurological:  Negative for dizziness.  Psychiatric/Behavioral:  The patient is not nervous/anxious.    Physical Exam Constitutional:      General: She is not in acute distress.    Appearance: She is well-developed. She is not diaphoretic.  Neck:     Thyroid: No thyromegaly.  Cardiovascular:     Rate and Rhythm: Normal rate and regular rhythm.     Pulses: Normal pulses.     Heart sounds: Normal heart sounds.  Pulmonary:     Effort: Pulmonary effort is normal. No respiratory distress.     Breath sounds: Normal breath sounds.  Abdominal:     General: Bowel sounds are normal. There is no distension.  Palpations: Abdomen is soft.     Tenderness: There is no abdominal tenderness.  Genitourinary:    Comments: Foley  Musculoskeletal:     Cervical back: Neck supple.     Right lower leg: No edema.     Left lower leg: No edema.     Comments:  Does not move legs; able to move upper extremities    Lymphadenopathy:     Cervical: No cervical adenopathy.  Skin:    General: Skin is warm and dry.  Neurological:     Mental Status: She is alert. Mental status is at baseline.  Psychiatric:        Mood and Affect: Mood normal.   ASSESSMENT/ PLAN:   TODAY  Hypertension associated with type 2 diabetes mellitus: b/p 140/70 will continue hygroton 25 mg daily is off lisinopril due to renal failure  2. GERD without esophagitis: is stable will continue prilosec 40 mg daily   3. Chronic constipation; is presently off medications will  monitor   4. 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    PREVIOUS    5. Diabetic peripheral neuropathy: is stable will continue gabapentin 100 mg twice daily   6. CKD stage 3b due to type 2 diabetes; bun 14; creat 0.88; GFR >60  will monitor   7. Normocytic anemia: is stable hgb 8.7 will monitor   8. Urine retention: is stable has foley will monitor   9. Chronic anxiety: is stable will continue remeron 15 mg nightly   10. Protein calorie malnutrition severe: is without change: albumin 3.4 will continue supplements as directed   12. Aortic atherosclerosis: ( ct 01-26-21) will monitor   13. Central cord syndrome at C4 level of cervical spine cord subsequent encounter: cord compression; quadriplegia C1-4 incomplete: is without change in status: will continue  asa 81 mg daily;   14. Restless leg syndrome: is stable will continue requip 1 mg nightly   15. Seizures: is worse: will continue keppra 750 mg twice daily  lamictal 12.5 mg daily; will continue prn ativan   16. Type 2 diabetes mellitus with diabetic neuropathy with long term current use of insulin: hgb a1c 8.3; will continue lantus 20 units nightly and humalog 5 units twice daily with meals.   17. Hyperlipidemia associated with type 2 diabetes mellitus: stable LDL 68 will continue lipitor 20 mg daily   18. Hypertension associated with type 2 diabetes mellitus: is stable LDL 68 will continue lipitor 20 mg daily       [name] NP [facility]   call [phone]

## 2021-10-23 DIAGNOSIS — R7982 Elevated C-reactive protein (CRP): Secondary | ICD-10-CM | POA: Insufficient documentation

## 2021-10-27 ENCOUNTER — Other Ambulatory Visit (HOSPITAL_COMMUNITY)
Admission: RE | Admit: 2021-10-27 | Discharge: 2021-10-27 | Disposition: A | Discharge status: Home / Self Care | Payer: Medicare HMO | Source: Skilled Nursing Facility | Attending: Adult Health | Admitting: Adult Health

## 2021-10-27 ENCOUNTER — Encounter: Payer: Self-pay | Admitting: Adult Health

## 2021-10-27 ENCOUNTER — Non-Acute Institutional Stay (SKILLED_NURSING_FACILITY): Payer: Medicare HMO | Admitting: Adult Health

## 2021-10-27 DIAGNOSIS — E1159 Type 2 diabetes mellitus with other circulatory complications: Secondary | ICD-10-CM | POA: Insufficient documentation

## 2021-10-27 DIAGNOSIS — R569 Unspecified convulsions: Secondary | ICD-10-CM

## 2021-10-27 LAB — HEMOGLOBIN A1C
Hgb A1c MFr Bld: 6.5 % — ABNORMAL HIGH (ref 4.8–5.6)
Mean Plasma Glucose: 139.85 mg/dL

## 2021-10-27 NOTE — Progress Notes (Signed)
Location:  [facility] Room Number: 154 Place of Service:  SNF (31) Provider:  [name], NP   CODE STATUS: DNR  Allergies  Allergen Reactions   Ace Inhibitors Other (See Comments)    Hyperkalemia    Codeine    Sulfa Antibiotics     Chief Complaint  Patient presents with   Acute Visit    Seizure    HPI:  She has had a seizure over this past weekend she did require one dose of IM ativan. She has not had any further seizure activity. She is presently taking keppra and lamictal. No reports of missed doses.   Past Medical History:  Diagnosis Date   Adult failure to thrive    Anemia    Anxiety    Atherosclerosis of aorta (HCC)    Central cord syndrome at C4 level of cervical spinal cord, subsequent encounter ([facility])    CKD (chronic kidney disease)    stage 3   Depression    DM type 2 with diabetic peripheral neuropathy (HCC)    Dysphagia    GERD (gastroesophageal reflux disease)    Gout    High cholesterol    HTN (hypertension)    Hyponatremia    MDD (major depressive disorder)    Neck pain    Neuropathy    Quadriplegia, C1-C4 incomplete (HCC)    Radiculopathy    RLS (restless legs syndrome)    Urinary retention     Past Surgical History:  Procedure Laterality Date   ABDOMINAL HYSTERECTOMY     ANTERIOR CERVICAL DECOMP/DISCECTOMY FUSION N/A 02/05/2021   Procedure: Cervical Three-Four  Anterior cervical decompression/discectomy/fusion;  Surgeon: Ashok Pall, MD;  Location: [facility];  Service: Neurosurgery;  Laterality: N/A;  RM 20   APPENDECTOMY     BACK SURGERY     BIOPSY  09/22/2021   Procedure: BIOPSY;  Surgeon: [name], DO;  Location: [facility];  Service: Endoscopy;;   CERVICAL DISC SURGERY     ESOPHAGOGASTRODUODENOSCOPY (EGD) WITH PROPOFOL N/A 09/22/2021   Procedure: ESOPHAGOGASTRODUODENOSCOPY (EGD) WITH PROPOFOL;  Surgeon: [name], DO;  Location: [facility];  Service: Endoscopy;  Laterality: N/A;  1:00pm    HAND SURGERY     KNEE SURGERY      Social History   Socioeconomic History   Marital status: Widowed    Spouse name: Not on file   Number of children: Not on file   Years of education: Not on file   Highest education level: Not on file  Occupational History   Not on file  Tobacco Use   Smoking status: Never   Smokeless tobacco: Never  Vaping Use   Vaping Use: Never used  Substance and Sexual Activity   Alcohol use: Never   Drug use: Never   Sexual activity: Never  Other Topics Concern   Not on file  Social History Narrative   Lives at [facility]   Social Determinants of Health   Financial Resource Strain: Not on file  Food Insecurity: Not on file  Transportation Needs: Not on file  Physical Activity: Not on file  Stress: Not on file  Social Connections: Not on file  Intimate Partner Violence: Not on file   Family History  Problem Relation Age of Onset   Cancer Mother    Cardiomyopathy Father    Seizures Sister        childhood   Seizures Brother        in  his 30s   Colon cancer Neg Hx       VITAL SIGNS BP (!) 118/56    Pulse 76    Temp (!) 97.4 F (36.3 C)    Resp 20    Ht 5\' 2"  (1.575 m)    SpO2 94%    BMI 30.25 kg/m   Outpatient Encounter Medications as of 10/27/2021  Medication Sig   acetaminophen (TYLENOL) 325 MG tablet Take 650 mg by mouth every 8 (eight) hours.   albuterol (VENTOLIN HFA) 108 (90 Base) MCG/ACT inhaler Inhale 1 puff into the lungs every 4 (four) hours as needed for wheezing or shortness of breath.   AMBULATORY NON FORMULARY MEDICATION Medication Name:  Continue monthly catheter changes with 34fr catheter at skilled nursing facility. (Patient taking differently: Medication Name:  Continue monthly catheter changes with 91fr catheter at skilled nursing facility.)   Artificial Saliva (BIOTENE MOISTURIZING MOUTH MT) Use as directed 1 application in the mouth or throat at bedtime. 9 pm   aspirin EC 81 MG tablet Take 81 mg by mouth  daily. Swallow whole.9 am   atorvastatin (LIPITOR) 20 MG tablet Take 1 tablet (20 mg total) by mouth every evening.   Balsam [place]-Castor Oil (VENELEX) OINT Apply topically. Apply to sacrum, coccyx, bilateral buttocks qshift for prevention.   BENZONATATE PO Take 100 mg by mouth every 8 (eight) hours as needed (cough).   chlorthalidone (HYGROTON) 25 MG tablet Take 25 mg by mouth daily.   colchicine 0.6 MG tablet Take 0.6 mg by mouth daily as needed.   DULoxetine (CYMBALTA) 60 MG capsule Take 60 mg by mouth daily.   ergocalciferol (VITAMIN D2) 1.25 MG (50000 UT) capsule Take 50,000 Units by mouth once a week.   fexofenadine (ALLEGRA) 180 MG tablet Take 180 mg by mouth daily.   gabapentin (NEURONTIN) 100 MG capsule Take 1 capsule (100 mg total) by mouth 2 (two) times daily.   guaiFENesin (MUCINEX) 600 MG 12 hr tablet Take by mouth 2 (two) times daily.   hydrocortisone 1 % ointment Apply 1 application topically 2 (two) times daily. Apply to itchy rash on abd and (R) breast twice daily until resolved   insulin lispro (HUMALOG) 100 UNIT/ML KwikPen Inject 5 Units into the skin 2 (two) times daily with a meal.   Insulin Pen Needle 30G X 5 MM MISC 1 Device by Does not apply route daily. 3/16"   lamoTRIgine (LAMICTAL) 25 MG tablet Take 25 mg by mouth daily. DX: Unspecified convulsions   LANTUS SOLOSTAR 100 UNIT/ML Solostar Pen Inject 15 Units into the skin at bedtime.   levETIRAcetam (KEPPRA) 1000 MG tablet Take 1,000 mg by mouth 2 (two) times daily. 9:00 am and 9:00 pm   levETIRAcetam (KEPPRA) 500 MG tablet Take 500 mg by mouth at bedtime. Along with 1000 mg for a total on 1500 mg in the pm (see other listing)   LORazepam (ATIVAN) 2 MG/ML injection Inject 0.5 mLs (1 mg total) into the vein every 15 (fifteen) minutes as needed.   melatonin 3 MG TABS tablet Take 3 mg by mouth at bedtime.   metoCLOPramide (REGLAN) 5 MG tablet Take 2.5 mg by mouth 4 (four) times daily.   mirtazapine (REMERON) 7.5 MG tablet  Take 7.5 mg by mouth at bedtime.   NON FORMULARY Diet - NAS, Cons CHO   omeprazole (PRILOSEC) 40 MG capsule Take 40 mg by mouth daily.   rOPINIRole (REQUIP) 1 MG tablet Take 1 mg by mouth at bedtime.  tiZANidine (ZANAFLEX) 2 MG tablet Take 2 mg by mouth every 6 (six) hours as needed for muscle spasms (for back and sciatic pain).   vitamin C (ASCORBIC ACID) 500 MG tablet Take 500 mg by mouth 2 (two) times daily.   Zinc Sulfate 220 (50 Zn) MG TABS Take by mouth daily.   [DISCONTINUED] Homeopathic Products (ZINC) LOZG (with A and C) Lozenges (vit a-vit c-zinc-propolis) lozenge; - ; oral Special Instructions: Give 5x/day x 2 weeks. 5 Times Per Day   [DISCONTINUED] predniSONE (DELTASONE) 20 MG tablet Take 20 mg by mouth daily. for CRP 3.2   No facility-administered encounter medications on file as of 10/27/2021.     SIGNIFICANT DIAGNOSTIC EXAMS  PREVIOUS   12-02-20; ct of head: No acute intracranial abnormality. Polypoid sinus disease.  12-02-20: ct of cervical spine:  Postoperative and degenerative changes in the cervical spine. No acute bony abnormality.  12-02-20; ct of pelvis:  1. No evidence of acute fracture or dislocation of the pelvis or hips. 2. 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 3. Degenerative changes in the hips with subcortical cysts on the left hip.  12-02-20: ct of left knee:  1. Subtle acute nondisplaced fracture through the medial aspect of the head of the fibula. 2. 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 3. There is a small joint effusion.  12-02-20: mri left hip:  1. No acute findings involving the left hip. 2. Mild chronic bilateral hip avascular necrosis. Moderate degenerative hip findings bilaterally. No hip effusion or regional bursitis. 3. Mild left greater than right hamstring tendinopathy. 4. 1.3 cm left eccentric Bartholin's cyst or Gartner cyst.  12-02-20: mri lumbar spine:  1.  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 2. L3-4: Bilateral posterolateral disc bulges, more prominent on the left. Mild left foraminal encroachment that could possibly affect the left L3 nerve. Moderate to severe bilateral facet osteoarthritis. These findings could relate to back pain or referred facet syndrome pain. 3. L4-5: Bilateral facet arthropathy with 2 mm of anterolisthesis. Bulging of the disc more prominent towards the right. Mild narrowing of the lateral recesses and of the intervertebral foramen on the right, but without visible neural compression. Findings at this level could relate to back pain or referred facet syndrome pain. 4. L5-S1: Previous left hemilaminectomy. Endplate osteophytes and bulging of the disc more prominent towards the left. Facet degeneration and hypertrophy left more than right with left foraminal stenosis and subarticular lateral recess stenosis could cause left-sided neural compression.   01-25-21: ct of head; maxillofacial cervical spine:  1. No acute intracranial pathology. 2. No acute/traumatic cervical spine pathology. 3. No acute facial bone fractures.  01-26-21: ct of chest abdomen and pelvis:  1. No noncontrast CT evidence of acute traumatic injury to the chest, abdomen, or pelvis. 2. Distended urinary bladder. Mild bilateral hydronephrosis and hydroureter to the bilateral ureterovesicular junctions. No obstructing calculus or other etiology identified. Correlate for urinary retention. 3. No fracture or dislocation of the lumbar spine. Moderate disc space height loss and osteophytosis at L5-S1. Probable small broad-based posterior disc bulges at L4-L5 and L5-S1. 4. Coronary artery disease. Aortic Atherosclerosis  01-26-21: ct of right shoulder:  1.  No acute osseous injury of the right shoulder. 2. Mild mineralization in the infraspinatus tendon as can be seen with calcific tendinosis.  01-29-21: MRI of spine:  Postoperative and multilevel  degenerative changes of the cervical spine. There is severe canal stenosis with cord compression at C3-C4. Abnormal cord  signal is present just below this level (at operative levels) and may reflect edema or myelomalacia No significant degenerative changes of the thoracic spine. Multilevel degenerative changes of lumbar spine similar to recent prior study.  01-30-21: renal ultrasound:  1. No acute abnormality. Small amount of right perinephric fluid, nonspecific.  02-05-21: chest x-ray:  1. Right internal jugular central venous catheter with tip over the mid SVC. No pneumothorax. 2. Hazy lung base opacities likely represent combination of atelectasis and pleural fluid. Vascular congestion.   03-06-21 chest x-ray: left lower lobe infiltrate likely representing pneumonia   03-26-21: lumbar x-ray: mild to moderate degenerative changes in lumbar spine  03-27-21; DEXA scan: t score -1.777  06-22-21: ct of abdomen and pelvis:  Left colonic diverticulosis.  No active diverticulitis. Bilateral hydronephrosis to the level of the bladder. No obstructing stones. Urinary bladder is distended with Foley catheter in place. Aortic atherosclerosis.  07-06-21: ct head:  No intracranial abnormality or other acute findings. Chronic bilateral maxillary sinus disease.  07-06-21: ct of abdomen and pelvis:  1. Heterogeneous enhancement of the left kidney with mild left perinephric edema, suspicious for pyelonephritis. There is also mild bladder wall thickening and perivesicular fat stranding with Foley catheter in place. Improvement in bilateral hydronephrosis from prior exam. 2. Stool distends the rectum at 7.6 cm with mild distal rectal wall thickening, suspicious for fecal impaction. 3. Colonic diverticulosis without diverticulitis. Aortic Atherosclerosis   07-07-21: MRI of head:  No acute intracranial finding. No sign of acute infarction. No lesion seen to explain seizure. The patient does have mild chronic  small-vessel ischemic changes considering age affecting the pons, thalami and cerebral hemispheric white matter. Left mastoid effusion  09-14-21: ct of head:  No acute intracranial abnormality. 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 New left sphenoid sinus air-fluid level, which may be seen with acute sinusitis.  09-28-21: ct of head No acute intracranial findings are seen in noncontrast CT brain. Chronic sinusitis.  Bilateral mastoid effusions.  NO NEW EXAMS       LABS REVIEWED PREVIOUS   12-02-20: wbc 6.4; hgb 11.3; hct 34.4; mcv 93.0 plt 216; glucose 209; bun 33; creat 1.13; k+ 4.3; na++ 137; ca 9.0; GFR 49; hgb a1c 8.6 12-03-20: wbc 6.5; hgb 9.8; hct 30.5 mcv 93.8 plt 182; glucose 188;bun 41; creat 1.49; k+ 4.4; na++ 134; ca 8.2; GFR 35 12-04-20; glucose 285; bun 29; creat 1.00; k+ 4.3; na++ 138; ca 8.7 GFR 56  01-25-21: wbc 6.4; hgb 10.6; hct 31.7; mcv 91.1 plt 227; glucose 152; bun 40; creat 1.2 k+ 4.2; na++ 135; ca 8.7; GFR 42; liver normal albumin 3.5 01-29-21: urine and blood culture: klebsiella pneumoniae 01-31-21: wbc 13.1; hgb 8.3; hct 23.6; mcv 88.1 plt 153; glucose 162; bun 67; creat 2.77; k+ 4.8; na++ 125; ca 7.5; GFR 17; ast 62; alt 57; albumin 1.7 02-04-21: wbc 9.8; hgb 7.6; hct 22.0; mcv 85.3 plt 212; glucose 161 bun 40; creat 1.40; k+ 3.4; na++ 127; ca 7.5; GFR 38; ast 62; alt 57; albumin 1.7  02-08-21: wbc 10.4; hgb 9.2; hct 27.6; mcv 87.3 plt 433; glucose 186; bun 23; creat 1.00 ;k+ 4.4; na++ 131; ca 8.1; GFR 56; ast 60; alt 61; albumin 2.1  02-20-21: hgb 9.4; hct 29.9 glucose 154; bun 51; creat 1.17; k+ 4.5; na++ 136; ca 8.9; GFR 47 02-24-21: glucose 175; bun 62; creat 1.13; k+ 4.5; na++ 133; ca 9.0 GFR 49 03-06-21: wbc 5.3; hgb 8.8; hct 27.1; mcv 93.4 plt 262; glucose 453; bun  66; creat 1.30; k+ 4.6; na++ 135; ca 9.0; GFR 41; d-dimer: 2.16; CRP 3.0  03-13-21: d-dimer: 1.49 03-20-21: hep C nr; d-dimer 0.86 03-27-21: d-dimer 0.52 03-31-21: wbc 5.5; hgb 8.5; hct 26.8; mcv 93.4 plt 203; hgb  a1c 7.6; chol 130; ldl 68; trig 138; hdl 34; urine micro-albumin 25.1 06-12-21: uric acid 5.9 06-15-21: wbc 6.0; hgb 9.8; hct 29.4; mcv 90,7 plt 242; glucose 187; bun 65; creat 1.33; k+ 4.2; na++ 131; ca 8.9; GFR 40; liver normal albumin 3.3  06-19-21: wbc 7.3; hgb 9.9; hct 30.1; mcv 89,9 plt 306; glucose 115; bun 29; creat 1.05; k+ 4.1; na++ 131; ca 8.7; GFR 53; liver normal albumin 3.1; urine micro-albumin 85.3 06-22-21; wbc 11.8; hgb 10.4; hct 30.9; mcv 89,3 plt 375; glucose 288; bun 44; creat 1.46; k+ 3.5; na++ 125; ca 8.3 GFR 36; liver normal albumin 3.3; lipase 37  07-06-21: wbc 7.6; hgb 9.7; hct 29.8; mcv 89,2 plt 409; glucose 92; bun 44; creat 1.09; k+ 5,3l na++ 136; ca 9.4; GFR 51; liver normal albumin 3.4  Hgb A1C: 8.3; urine culture: proteus mirabilis; + 30,000 colonies klebsiella pneumoniae  08-29-21: wbc 5.6; hgb 9.0; hct 27.6; mcv 94.2 plt 244; glucose 106; bun 80; creat 1.35; k+ 5.8; na++ 134; ca 9.0; GFR 39 liver normal albumin 3.5 vit B 12: 446; folate 7.7; iron 79 tibc 280; ferritin 178 09-04-21: glucose 66; bun 79; creat 1.44; k+ 5.5; na++ 135; ca 9.2; GFR 36 09-14-21: wbc 6.2; hgb 8.9; hct 27.1; mcv 96.1 plt 217; glucose 171; bun 58; creat 1.69; k+ 5.8; na++ 134; ca 8.7; GFR 30; urine culture: multiple species 09-15-20: k+ 6.3 09-16-21: k+ 5.0  09-28-21: wbc 6.0; hgb 9.2; hct 27.9; mcv 95.2 plt 252; glucose 160; bun 57; creat 1.33; k+ 5.1; na++ 133; ca 8.9; GFR 40; mag 1.8; liver normal albumin 3.8  10-17-21: wbc 5.9; hgb 8.9; hct 26.6; mcv 95.3 plt 223; glucose 171; bun 44; creat 1.31;k+ 4.8; na++ 134; ca 8.6; gfr 40. D-dimer 1.21; crp 3.2   NO NEW LABS   Review of Systems  Constitutional:  Negative for malaise/fatigue.  Respiratory:  Negative for cough and shortness of breath.   Cardiovascular:  Negative for chest pain, palpitations and leg swelling.  Gastrointestinal:  Negative for abdominal pain, constipation and heartburn.  Musculoskeletal:  Negative for back pain, joint pain  and myalgias.  Skin: Negative.   Neurological:  Negative for dizziness.  Psychiatric/Behavioral:  The patient is not nervous/anxious.     Physical Exam Constitutional:      General: She is not in acute distress.    Appearance: She is well-developed. She is not diaphoretic.  Neck:     Thyroid: No thyromegaly.  Cardiovascular:     Rate and Rhythm: Normal rate and regular rhythm.     Pulses: Normal pulses.     Heart sounds: Normal heart sounds.  Pulmonary:     Effort: Pulmonary effort is normal. No respiratory distress.     Breath sounds: Normal breath sounds.  Abdominal:     General: Bowel sounds are normal. There is no distension.     Palpations: Abdomen is soft.     Tenderness: There is no abdominal tenderness.  Genitourinary:    Comments: foley Musculoskeletal:     Cervical back: Neck supple.     Right lower leg: No edema.     Left lower leg: No edema.     Comments: Does not move legs; able to move upper extremities  left upper extremity is weak   Lymphadenopathy:     Cervical: No cervical adenopathy.  Skin:    General: Skin is warm and dry.  Neurological:     Mental Status: She is alert. Mental status is at baseline.  Psychiatric:        Mood and Affect: Mood normal.     ASSESSMENT/ PLAN:  TODAY  Seizures:  Will continue keppra 1000 mg in the AM and 1500 mg in the PM will increase lamictal to 25 mg daily    [name] NP [facility]   call [phone]

## 2021-11-03 ENCOUNTER — Non-Acute Institutional Stay (SKILLED_NURSING_FACILITY): Payer: Medicare HMO | Admitting: Adult Health

## 2021-11-03 ENCOUNTER — Encounter: Payer: Self-pay | Admitting: Adult Health

## 2021-11-03 DIAGNOSIS — R319 Hematuria, unspecified: Secondary | ICD-10-CM | POA: Diagnosis not present

## 2021-11-03 NOTE — Progress Notes (Signed)
Location:  [facility] Room Number: 154-W Place of Service:  SNF (31)   CODE STATUS: DNR  Allergies  Allergen Reactions   Ace Inhibitors Other (See Comments)    Hyperkalemia    Codeine    Sulfa Antibiotics     Chief Complaint  Patient presents with   Acute Visit    Blood in urine    HPI:  Staff report that this AM she was found to have blood and small clots in her foley bag. She denies any bladder pain; no leaking noted; no fevers. No abdominal pain.   Past Medical History:  Diagnosis Date   Adult failure to thrive    Anemia    Anxiety    Atherosclerosis of aorta (HCC)    Central cord syndrome at C4 level of cervical spinal cord, subsequent encounter ([place] [facility])    CKD (chronic kidney disease)    stage 3   Depression    DM type 2 with diabetic peripheral neuropathy (HCC)    Dysphagia    GERD (gastroesophageal reflux disease)    Gout    High cholesterol    HTN (hypertension)    Hyponatremia    MDD (major depressive disorder)    Neck pain    Neuropathy    Quadriplegia, C1-C4 incomplete (HCC)    Radiculopathy    RLS (restless legs syndrome)    Urinary retention     Past Surgical History:  Procedure Laterality Date   ABDOMINAL HYSTERECTOMY     ANTERIOR CERVICAL DECOMP/DISCECTOMY FUSION N/A 02/05/2021   Procedure: Cervical Three-Four  Anterior cervical decompression/discectomy/fusion;  Surgeon: Ashok Pall, MD;  Location: [facility];  Service: Neurosurgery;  Laterality: N/A;  RM 20   APPENDECTOMY     BACK SURGERY     BIOPSY  09/22/2021   Procedure: BIOPSY;  Surgeon: [name], DO;  [facility];  Service: Endoscopy;;   CERVICAL DISC SURGERY     ESOPHAGOGASTRODUODENOSCOPY (EGD) WITH PROPOFOL N/A 09/22/2021   Procedure: ESOPHAGOGASTRODUODENOSCOPY (EGD) WITH PROPOFOL;  Surgeon: [name], DO;  [facility];  Service: Endoscopy;  Laterality: N/A;  1:00pm   HAND SURGERY     KNEE SURGERY      Social History    Socioeconomic History   Marital status: Widowed    Spouse name: Not on file   Number of children: Not on file   Years of education: Not on file   Highest education level: Not on file  Occupational History   Not on file  Tobacco Use   Smoking status: Never   Smokeless tobacco: Never  Vaping Use   Vaping Use: Never used  Substance and Sexual Activity   Alcohol use: Never   Drug use: Never   Sexual activity: Never  Other Topics Concern   Not on file  Social History Narrative   Lives at [facility]   Social Determinants of Health   Financial Resource Strain: Not on file  Food Insecurity: Not on file  Transportation Needs: Not on file  Physical Activity: Not on file  Stress: Not on file  Social Connections: Not on file  Intimate Partner Violence: Not on file   Family History  Problem Relation Age of Onset   Cancer Mother    Cardiomyopathy Father    Seizures Sister        childhood   Seizures Brother        in his 81s   Colon cancer Neg Hx  VITAL SIGNS BP 130/74    Pulse 98    Temp 98 F (36.7 C)    Resp 20    Ht 5\' 2"  (1.575 m)    Wt 165 lb 6.4 oz (75 kg)    SpO2 97%    BMI 30.25 kg/m   Outpatient Encounter Medications as of 11/03/2021  Medication Sig   acetaminophen (TYLENOL) 325 MG tablet Take 650 mg by mouth every 8 (eight) hours.   albuterol (VENTOLIN HFA) 108 (90 Base) MCG/ACT inhaler Inhale 1 puff into the lungs every 4 (four) hours as needed for wheezing or shortness of breath.   Artificial Saliva (BIOTENE MOISTURIZING MOUTH MT) Use as directed 1 application in the mouth or throat at bedtime. 9 pm   aspirin EC 81 MG tablet Take 81 mg by mouth daily. Swallow whole.9 am   atorvastatin (LIPITOR) 20 MG tablet Take 1 tablet (20 mg total) by mouth every evening.   Balsam [place]-Castor Oil (VENELEX) OINT Apply topically. Apply to sacrum, coccyx, bilateral buttocks qshift for prevention.   BENZONATATE PO Take 100 mg by mouth every 8 (eight) hours as  needed (cough).   chlorthalidone (HYGROTON) 25 MG tablet Take 25 mg by mouth daily.   colchicine 0.6 MG tablet Take 0.6 mg by mouth daily as needed.   DULoxetine (CYMBALTA) 60 MG capsule Take 60 mg by mouth daily.   fexofenadine (ALLEGRA) 180 MG tablet Take 180 mg by mouth daily.   gabapentin (NEURONTIN) 100 MG capsule Take 1 capsule (100 mg total) by mouth 2 (two) times daily.   guaiFENesin (MUCINEX) 600 MG 12 hr tablet Take by mouth 2 (two) times daily.   hydrocortisone 1 % ointment Apply 1 application topically 2 (two) times daily. Apply to itchy rash on abd and (R) breast twice daily until resolved   insulin lispro (HUMALOG) 100 UNIT/ML KwikPen Inject 5 Units into the skin 2 (two) times daily with a meal.   lamoTRIgine (LAMICTAL) 25 MG tablet Take 25 mg by mouth daily. DX: Unspecified convulsions   LANTUS SOLOSTAR 100 UNIT/ML Solostar Pen Inject 15 Units into the skin at bedtime.   levETIRAcetam (KEPPRA) 1000 MG tablet Take 1,000 mg by mouth 2 (two) times daily. 9:00 am and 9:00 pm   levETIRAcetam (KEPPRA) 500 MG tablet Take 500 mg by mouth at bedtime. Along with 1000 mg for a total on 1500 mg in the pm (see other listing)   Lidocaine-Glycerin (PREPARATION H EX) Apply topically. Maximum Strength (phenyleph-pramoxin-glycr-w.pet) [OTC] cream; 0.25-1 %; rectal Special Instructions: As needed for rectal itching/hemorrhoids. Once A Day   LORazepam (ATIVAN) 2 MG/ML injection Inject 0.5 mLs (1 mg total) into the vein every 15 (fifteen) minutes as needed.   melatonin 3 MG TABS tablet Take 3 mg by mouth at bedtime.   metoCLOPramide (REGLAN) 5 MG tablet Take 2.5 mg by mouth 4 (four) times daily.   mirtazapine (REMERON) 7.5 MG tablet Take 7.5 mg by mouth at bedtime.   NON FORMULARY Diet - NAS, Cons CHO   omeprazole (PRILOSEC) 40 MG capsule Take 40 mg by mouth daily.   rOPINIRole (REQUIP) 1 MG tablet Take 1 mg by mouth at bedtime.   tiZANidine (ZANAFLEX) 2 MG tablet Take 2 mg by mouth every 6 (six)  hours as needed for muscle spasms (for back and sciatic pain).   AMBULATORY NON FORMULARY MEDICATION Medication Name:  Continue monthly catheter changes with 57fr catheter at skilled nursing facility. (Patient taking differently: Medication Name:  Continue monthly catheter changes  with 59fr catheter at skilled nursing facility.)   Insulin Pen Needle 30G X 5 MM MISC 1 Device by Does not apply route daily. 3/16"   [DISCONTINUED] Zinc Sulfate 220 (50 Zn) MG TABS Take by mouth daily.   No facility-administered encounter medications on file as of 11/03/2021.     SIGNIFICANT DIAGNOSTIC EXAMS  PREVIOUS   12-02-20; ct of head: No acute intracranial abnormality. Polypoid sinus disease.  12-02-20: ct of cervical spine:  Postoperative and degenerative changes in the cervical spine. No acute bony abnormality.  12-02-20; ct of pelvis:  1. No evidence of acute fracture or dislocation of the pelvis or hips. 2. 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 3. Degenerative changes in the hips with subcortical cysts on the left hip.  12-02-20: ct of left knee:  1. Subtle acute nondisplaced fracture through the medial aspect of the head of the fibula. 2. 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 3. There is a small joint effusion.  12-02-20: mri left hip:  1. No acute findings involving the left hip. 2. Mild chronic bilateral hip avascular necrosis. Moderate degenerative hip findings bilaterally. No hip effusion or regional bursitis. 3. Mild left greater than right hamstring tendinopathy. 4. 1.3 cm left eccentric Bartholin's cyst or Gartner cyst.  12-02-20: mri lumbar spine:  1. 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 2. L3-4: Bilateral posterolateral disc bulges, more prominent on the left. Mild left foraminal encroachment that could possibly affect the left L3 nerve. Moderate to severe bilateral facet osteoarthritis. These  findings could relate to back pain or referred facet syndrome pain. 3. L4-5: Bilateral facet arthropathy with 2 mm of anterolisthesis. Bulging of the disc more prominent towards the right. Mild narrowing of the lateral recesses and of the intervertebral foramen on the right, but without visible neural compression. Findings at this level could relate to back pain or referred facet syndrome pain. 4. L5-S1: Previous left hemilaminectomy. Endplate osteophytes and bulging of the disc more prominent towards the left. Facet degeneration and hypertrophy left more than right with left foraminal stenosis and subarticular lateral recess stenosis could cause left-sided neural compression.   01-25-21: ct of head; maxillofacial cervical spine:  1. No acute intracranial pathology. 2. No acute/traumatic cervical spine pathology. 3. No acute facial bone fractures.  01-26-21: ct of chest abdomen and pelvis:  1. No noncontrast CT evidence of acute traumatic injury to the chest, abdomen, or pelvis. 2. Distended urinary bladder. Mild bilateral hydronephrosis and hydroureter to the bilateral ureterovesicular junctions. No obstructing calculus or other etiology identified. Correlate for urinary retention. 3. No fracture or dislocation of the lumbar spine. Moderate disc space height loss and osteophytosis at L5-S1. Probable small broad-based posterior disc bulges at L4-L5 and L5-S1. 4. Coronary artery disease. Aortic Atherosclerosis  01-26-21: ct of right shoulder:  1.  No acute osseous injury of the right shoulder. 2. Mild mineralization in the infraspinatus tendon as can be seen with calcific tendinosis.  01-29-21: MRI of spine:  Postoperative and multilevel degenerative changes of the cervical spine. There is severe canal stenosis with cord compression at C3-C4. Abnormal cord signal is present just below this level (at operative levels) and may reflect edema or myelomalacia No significant degenerative changes of the  thoracic spine. Multilevel degenerative changes of lumbar spine similar to recent prior study.  01-30-21: renal ultrasound:  1. No acute abnormality. Small amount of right perinephric fluid, nonspecific.  02-05-21: chest x-ray:  1. Right internal jugular central venous  catheter with tip over the mid SVC. No pneumothorax. 2. Hazy lung base opacities likely represent combination of atelectasis and pleural fluid. Vascular congestion.   03-06-21 chest x-ray: left lower lobe infiltrate likely representing pneumonia   03-26-21: lumbar x-ray: mild to moderate degenerative changes in lumbar spine  03-27-21; DEXA scan: t score -1.777  06-22-21: ct of abdomen and pelvis:  Left colonic diverticulosis.  No active diverticulitis. Bilateral hydronephrosis to the level of the bladder. No obstructing stones. Urinary bladder is distended with Foley catheter in place. Aortic atherosclerosis.  07-06-21: ct head:  No intracranial abnormality or other acute findings. Chronic bilateral maxillary sinus disease.  07-06-21: ct of abdomen and pelvis:  1. Heterogeneous enhancement of the left kidney with mild left perinephric edema, suspicious for pyelonephritis. There is also mild bladder wall thickening and perivesicular fat stranding with Foley catheter in place. Improvement in bilateral hydronephrosis from prior exam. 2. Stool distends the rectum at 7.6 cm with mild distal rectal wall thickening, suspicious for fecal impaction. 3. Colonic diverticulosis without diverticulitis. Aortic Atherosclerosis   07-07-21: MRI of head:  No acute intracranial finding. No sign of acute infarction. No lesion seen to explain seizure. The patient does have mild chronic small-vessel ischemic changes considering age affecting the pons, thalami and cerebral hemispheric white matter. Left mastoid effusion  09-14-21: ct of head:  No acute intracranial abnormality. 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 New left sphenoid sinus air-fluid  level, which may be seen with acute sinusitis.  09-28-21: ct of head No acute intracranial findings are seen in noncontrast CT brain. Chronic sinusitis.  Bilateral mastoid effusions.  NO NEW EXAMS       LABS REVIEWED PREVIOUS   12-02-20: wbc 6.4; hgb 11.3; hct 34.4; mcv 93.0 plt 216; glucose 209; bun 33; creat 1.13; k+ 4.3; na++ 137; ca 9.0; GFR 49; hgb a1c 8.6 12-03-20: wbc 6.5; hgb 9.8; hct 30.5 mcv 93.8 plt 182; glucose 188;bun 41; creat 1.49; k+ 4.4; na++ 134; ca 8.2; GFR 35 12-04-20; glucose 285; bun 29; creat 1.00; k+ 4.3; na++ 138; ca 8.7 GFR 56  01-25-21: wbc 6.4; hgb 10.6; hct 31.7; mcv 91.1 plt 227; glucose 152; bun 40; creat 1.2 k+ 4.2; na++ 135; ca 8.7; GFR 42; liver normal albumin 3.5 01-29-21: urine and blood culture: klebsiella pneumoniae 01-31-21: wbc 13.1; hgb 8.3; hct 23.6; mcv 88.1 plt 153; glucose 162; bun 67; creat 2.77; k+ 4.8; na++ 125; ca 7.5; GFR 17; ast 62; alt 57; albumin 1.7 02-04-21: wbc 9.8; hgb 7.6; hct 22.0; mcv 85.3 plt 212; glucose 161 bun 40; creat 1.40; k+ 3.4; na++ 127; ca 7.5; GFR 38; ast 62; alt 57; albumin 1.7  02-08-21: wbc 10.4; hgb 9.2; hct 27.6; mcv 87.3 plt 433; glucose 186; bun 23; creat 1.00 ;k+ 4.4; na++ 131; ca 8.1; GFR 56; ast 60; alt 61; albumin 2.1  02-20-21: hgb 9.4; hct 29.9 glucose 154; bun 51; creat 1.17; k+ 4.5; na++ 136; ca 8.9; GFR 47 02-24-21: glucose 175; bun 62; creat 1.13; k+ 4.5; na++ 133; ca 9.0 GFR 49 03-06-21: wbc 5.3; hgb 8.8; hct 27.1; mcv 93.4 plt 262; glucose 453; bun 66; creat 1.30; k+ 4.6; na++ 135; ca 9.0; GFR 41; d-dimer: 2.16; CRP 3.0  03-13-21: d-dimer: 1.49 03-20-21: hep C nr; d-dimer 0.86 03-27-21: d-dimer 0.52 03-31-21: wbc 5.5; hgb 8.5; hct 26.8; mcv 93.4 plt 203; hgb a1c 7.6; chol 130; ldl 68; trig 138; hdl 34; urine micro-albumin 25.1 06-12-21: uric acid 5.9 06-15-21: wbc 6.0; hgb 9.8;  hct 29.4; mcv 90,7 plt 242; glucose 187; bun 65; creat 1.33; k+ 4.2; na++ 131; ca 8.9; GFR 40; liver normal albumin 3.3  06-19-21: wbc 7.3; hgb  9.9; hct 30.1; mcv 89,9 plt 306; glucose 115; bun 29; creat 1.05; k+ 4.1; na++ 131; ca 8.7; GFR 53; liver normal albumin 3.1; urine micro-albumin 85.3 06-22-21; wbc 11.8; hgb 10.4; hct 30.9; mcv 89,3 plt 375; glucose 288; bun 44; creat 1.46; k+ 3.5; na++ 125; ca 8.3 GFR 36; liver normal albumin 3.3; lipase 37  07-06-21: wbc 7.6; hgb 9.7; hct 29.8; mcv 89,2 plt 409; glucose 92; bun 44; creat 1.09; k+ 5,3l na++ 136; ca 9.4; GFR 51; liver normal albumin 3.4  Hgb A1C: 8.3; urine culture: proteus mirabilis; + 30,000 colonies klebsiella pneumoniae  08-29-21: wbc 5.6; hgb 9.0; hct 27.6; mcv 94.2 plt 244; glucose 106; bun 80; creat 1.35; k+ 5.8; na++ 134; ca 9.0; GFR 39 liver normal albumin 3.5 vit B 12: 446; folate 7.7; iron 79 tibc 280; ferritin 178 09-04-21: glucose 66; bun 79; creat 1.44; k+ 5.5; na++ 135; ca 9.2; GFR 36 09-14-21: wbc 6.2; hgb 8.9; hct 27.1; mcv 96.1 plt 217; glucose 171; bun 58; creat 1.69; k+ 5.8; na++ 134; ca 8.7; GFR 30; urine culture: multiple species 09-15-20: k+ 6.3 09-16-21: k+ 5.0  09-28-21: wbc 6.0; hgb 9.2; hct 27.9; mcv 95.2 plt 252; glucose 160; bun 57; creat 1.33; k+ 5.1; na++ 133; ca 8.9; GFR 40; mag 1.8; liver normal albumin 3.8  10-17-21: wbc 5.9; hgb 8.9; hct 26.6; mcv 95.3 plt 223; glucose 171; bun 44; creat 1.31;k+ 4.8; na++ 134; ca 8.6; gfr 40. D-dimer 1.21; crp 3.2   NO NEW LABS   Review of Systems  Constitutional:  Negative for malaise/fatigue.  Respiratory:  Negative for cough and shortness of breath.   Cardiovascular:  Negative for chest pain, palpitations and leg swelling.  Gastrointestinal:  Negative for abdominal pain, constipation and heartburn.  Genitourinary:  Positive for hematuria.  Musculoskeletal:  Negative for back pain, joint pain and myalgias.  Skin: Negative.   Neurological:  Negative for dizziness.  Psychiatric/Behavioral:  The patient is not nervous/anxious.     Physical Exam Constitutional:      General: She is not in acute distress.     Appearance: She is well-developed. She is not diaphoretic.  Neck:     Thyroid: No thyromegaly.  Cardiovascular:     Rate and Rhythm: Normal rate and regular rhythm.     Pulses: Normal pulses.     Heart sounds: Normal heart sounds.  Pulmonary:     Effort: Pulmonary effort is normal. No respiratory distress.     Breath sounds: Normal breath sounds.  Abdominal:     General: Bowel sounds are normal. There is no distension.     Palpations: Abdomen is soft.     Tenderness: There is no abdominal tenderness.  Genitourinary:    Comments: Foley with blood present and few small clots present  Musculoskeletal:     Cervical back: Neck supple.     Right lower leg: No edema.     Left lower leg: No edema.     Comments:  Does not move legs; able to move upper extremities   left upper extremity is weak    Lymphadenopathy:     Cervical: No cervical adenopathy.  Skin:    General: Skin is warm and dry.  Neurological:     Mental Status: She is alert. Mental status is at baseline.  Psychiatric:  Mood and Affect: Mood normal.     ASSESSMENT/ PLAN:  TODAY  Hematuria: more than likely this represents trauma to her catheter; as the urine in the tube is clear. No further signs of bleeding present.    [name] NP [facility]  call [phone]

## 2021-11-04 LAB — C-REACTIVE PROTEIN: CRP: 21.2 mg/dL — ABNORMAL HIGH (ref <1.0)

## 2021-11-04 LAB — D-DIMER, QUANTITATIVE: D-Dimer, Quant: 1.46 ug{FEU}/mL — ABNORMAL HIGH (ref 0.00–0.50)

## 2021-11-19 ENCOUNTER — Non-Acute Institutional Stay (SKILLED_NURSING_FACILITY): Payer: Medicare HMO | Admitting: Internal Medicine

## 2021-11-19 ENCOUNTER — Encounter: Payer: Self-pay | Admitting: Internal Medicine

## 2021-11-19 DIAGNOSIS — N1832 Chronic kidney disease, stage 3b: Secondary | ICD-10-CM | POA: Diagnosis not present

## 2021-11-19 DIAGNOSIS — R7982 Elevated C-reactive protein (CRP): Secondary | ICD-10-CM | POA: Diagnosis not present

## 2021-11-19 DIAGNOSIS — N183 Chronic kidney disease, stage 3 unspecified: Secondary | ICD-10-CM | POA: Diagnosis not present

## 2021-11-19 DIAGNOSIS — L282 Other prurigo: Secondary | ICD-10-CM

## 2021-11-19 DIAGNOSIS — D631 Anemia in chronic kidney disease: Secondary | ICD-10-CM | POA: Diagnosis not present

## 2021-11-19 DIAGNOSIS — E43 Unspecified severe protein-calorie malnutrition: Secondary | ICD-10-CM

## 2021-11-19 DIAGNOSIS — F339 Major depressive disorder, recurrent, unspecified: Secondary | ICD-10-CM

## 2021-11-19 DIAGNOSIS — E1122 Type 2 diabetes mellitus with diabetic chronic kidney disease: Secondary | ICD-10-CM | POA: Diagnosis not present

## 2021-11-19 NOTE — Assessment & Plan Note (Signed)
09/28/21 total protein 7.1, albumin 3.8. She is weak in all extremities but this is most likely due to her cervical stenosis ?

## 2021-11-19 NOTE — Progress Notes (Signed)
? ?NURSING HOME LOCATION:  [place] ?ROOM NUMBER:  154 W ? ?CODE STATUS:  DNR ? ?PCP: [name] NP,PSC ? ?This is a nursing facility follow up visit of chronic medical diagnoses & to document compliance with Regulation 483.30 (c) in [employer] [place] Phase 2 which mandates caregiver visit ( visits can alternate among physician, PA or NP as per statutes) within 10 days of 30 days / 60 days/ 90 days post admission to SNF date   ? ?Interim medical record and care since last SNF visit was updated with review of diagnostic studies and change in clinical status since last visit were documented. ? ?HPI: She is a permanent resident of this facility with diagnoses of adult failure to thrive syndrome, chronic anemia, atherosclerosis, cervical cord syndrome, diabetes with stage III CKD, GERD, history of gout, dyslipidemia, hypertension, and depression. ? ?Current labs revealed a sodium of 134 and creatinine 1.31 with a GFR of 40 indicating CKD stage IIIb.  A1c was 6.5% indicating excellent control.  Anemia is fairly stable with most recent H/H of 8.9/26.6.  B12 was normal at 446.  Albumin was 3.8 and total protein 7.1.  In December 2020 TSH was 2.830, therapeutic.  C-reactive protein was mildly elevated at 3.2 on 10/17/2021 but was 21.2 on 3/7. ? ? ?Review of systems: She had multiple complaints.  Her initial complaint was "my body keeps feeling numb and stiff in knees, legs, feet, hands, arms, & shoulders ".  She states that she is not having hip involvement. ?She describes frequent constipation.  She describes anorexia.   ?A new issue has been bleeding around the right great toe.  She subsequently told the NP student that her daughter had squeezed the toe and pus and blood had extruded from it.  She describes her urine as intermittently dark and relates this to either ingestion of ginger ale or the presence of blood possibly.  She describes a pruritic rash across the abdomen.  She has  occasional mucus collecting in her throat. ? ?Constitutional: No fever, significant weight change  ?Eyes: No redness, discharge, pain, vision change ?ENT/mouth: No purulent discharge, earache, change in hearing, sore throat  ?Cardiovascular: No chest pain, palpitations, paroxysmal nocturnal dyspnea, claudication, edema  ?Respiratory: No hemoptysis, DOE, significant snoring, apnea   ?Gastrointestinal: No heartburn, dysphagia, abdominal pain, nausea /vomiting, rectal bleeding, melena ?Genitourinary: No dysuria, hematuria, pyuria ?Neurologic: No dizziness, headache, syncope, seizures ?Psychiatric: No significant anxiety, depression, insomnia ?Endocrine: No change in hair/nails, excessive thirst, excessive hunger, excessive urination  ?Hematologic/lymphatic: No significant bruising, lymphadenopathy, abnormal bleeding ?Allergy/immunology: No itchy/watery eyes, significant sneezing, urticaria, angioedema ? ?Physical exam:  ?Pertinent or positive findings: Her affect is very flat, almost morose.  Facies are blank.  Ptosis present greater on the right than the left.  She has complete dentures.  Cardiopulmonary exam was unremarkable.  Abdomen is protuberant.  Foley catheter is present.  Trace edema is noted at the sock line.  She has decreased range of motion of the lower extremities.  All extremities are weak to opposition.  She has interosseous wasting of the hands.  There is dried blood around the great toenail.  There is a bland, faintly hyperpigmented rash across the abdomen above the umbilicus. ? ?General appearance: Adequately nourished; no acute distress, increased work of breathing is present.   ?Lymphatic: No lymphadenopathy about the head, neck, axilla. ?Eyes: No conjunctival inflammation or lid edema is present. There is no scleral icterus. ?Ears:  External  ear exam shows no significant lesions or deformities.   ?Nose:  External nasal examination shows no deformity or inflammation. Nasal mucosa are pink and moist  without lesions, exudates ?Oral exam: There is no oropharyngeal erythema or exudate. ?Neck:  No thyromegaly, masses, tenderness noted.    ?Heart:  Normal rate and regular rhythm. S1 and S2 normal without gallop, murmur, click, rub .  ?Lungs: Chest clear to auscultation without wheezes, rhonchi, rales, rubs. ?Abdomen: Bowel sounds are normal. Abdomen is soft and nontender with no organomegaly, hernias, masses. ?GU: Deferred  ?Extremities:  No cyanosis, clubbing  ?Neurologic exam :Balance, Rhomberg, finger to nose testing could not be completed due to clinical state ?Skin: Warm & dry w/o tenting. ? ?See summary under each active problem in the Problem List with associated updated therapeutic plan ? ? ?

## 2021-11-19 NOTE — Assessment & Plan Note (Signed)
Affect markedly flat, almost morose. ?

## 2021-11-20 NOTE — Assessment & Plan Note (Addendum)
Anemia is essentially stable; most recent values revealed H/H of 8.9/26.6.  B12 level was normal.  No significant bleeding dyscrasias reported at the SNF. Minor bleeding around R great toenail. I shall ask NP & Wound Care Nurse to monitor. Marland Kitchen ?

## 2021-11-20 NOTE — Patient Instructions (Signed)
See assessment and plan under each diagnosis in the problem list and acutely for this visit 

## 2021-11-20 NOTE — Assessment & Plan Note (Addendum)
Current creatinine is 1.31 with a GFR of 40 indicating CKD stage IIIb.  No nephrotoxic drug risk identified @ present dosages. ?

## 2021-11-20 NOTE — Assessment & Plan Note (Addendum)
See 11/19/2021 history and physical.  She is describing polyarthralgias.  C-reactive protein has risen significantly from a value of 3.2 on 10/17/2021 to 21.2 on 3/7.  There is no current sed rate.  Possible rheumatologic condition such as PMR should be evaluated.  This will be discussed with the [facility] NP. ?

## 2021-11-21 DIAGNOSIS — L282 Other prurigo: Secondary | ICD-10-CM | POA: Insufficient documentation

## 2021-11-21 NOTE — Assessment & Plan Note (Signed)
Derm consult

## 2021-11-24 ENCOUNTER — Other Ambulatory Visit (HOSPITAL_COMMUNITY)
Admission: RE | Admit: 2021-11-24 | Discharge: 2021-11-24 | Disposition: A | Discharge status: Home / Self Care | Payer: Medicare HMO | Source: Skilled Nursing Facility | Attending: Adult Health | Admitting: Adult Health

## 2021-11-24 DIAGNOSIS — G8252 Quadriplegia, C1-C4 incomplete: Secondary | ICD-10-CM | POA: Insufficient documentation

## 2021-11-24 LAB — C-REACTIVE PROTEIN: CRP: 0.6 mg/dL (ref <1.0)

## 2021-11-24 LAB — CBC WITH DIFFERENTIAL/PLATELET
Abs Immature Granulocytes: 0.01 10*3/uL (ref 0.00–0.07)
Basophils Absolute: 0 10*3/uL (ref 0.0–0.1)
Basophils Relative: 1 %
Eosinophils Absolute: 0.6 10*3/uL — ABNORMAL HIGH (ref 0.0–0.5)
Eosinophils Relative: 11 %
HCT: 27.5 % — ABNORMAL LOW (ref 36.0–46.0)
Hemoglobin: 8.7 g/dL — ABNORMAL LOW (ref 12.0–15.0)
Immature Granulocytes: 0 %
Lymphocytes Relative: 40 %
Lymphs Abs: 2.2 10*3/uL (ref 0.7–4.0)
MCH: 29.8 pg (ref 26.0–34.0)
MCHC: 31.6 g/dL (ref 30.0–36.0)
MCV: 94.2 fL (ref 80.0–100.0)
Monocytes Absolute: 0.4 10*3/uL (ref 0.1–1.0)
Monocytes Relative: 8 %
Neutro Abs: 2.1 10*3/uL (ref 1.7–7.7)
Neutrophils Relative %: 40 %
Platelets: 218 10*3/uL (ref 150–400)
RBC: 2.92 MIL/uL — ABNORMAL LOW (ref 3.87–5.11)
RDW: 14 % (ref 11.5–15.5)
WBC: 5.4 10*3/uL (ref 4.0–10.5)
nRBC: 0 % (ref 0.0–0.2)

## 2021-11-24 LAB — SEDIMENTATION RATE: Sed Rate: 80 mm/hr — ABNORMAL HIGH (ref 0–22)

## 2021-11-25 ENCOUNTER — Encounter (HOSPITAL_COMMUNITY)
Admission: RE | Admit: 2021-11-25 | Discharge: 2021-11-25 | Disposition: A | Discharge status: Home / Self Care | Payer: Medicare HMO | Source: Skilled Nursing Facility | Attending: Adult Health | Admitting: Adult Health

## 2021-11-25 DIAGNOSIS — M4802 Spinal stenosis, cervical region: Secondary | ICD-10-CM | POA: Diagnosis not present

## 2021-11-25 DIAGNOSIS — R569 Unspecified convulsions: Secondary | ICD-10-CM | POA: Diagnosis not present

## 2021-11-25 LAB — ANA: Anti Nuclear Antibody (ANA): NEGATIVE

## 2021-11-25 LAB — LACTIC ACID, PLASMA: Lactic Acid, Venous: 0.8 mmol/L (ref 0.5–1.9)

## 2021-12-01 DIAGNOSIS — G8252 Quadriplegia, C1-C4 incomplete: Secondary | ICD-10-CM | POA: Diagnosis not present

## 2021-12-01 DIAGNOSIS — R41841 Cognitive communication deficit: Secondary | ICD-10-CM | POA: Diagnosis not present

## 2021-12-07 DIAGNOSIS — Z1159 Encounter for screening for other viral diseases: Secondary | ICD-10-CM | POA: Diagnosis not present

## 2021-12-07 DIAGNOSIS — G8252 Quadriplegia, C1-C4 incomplete: Secondary | ICD-10-CM | POA: Diagnosis not present

## 2021-12-15 ENCOUNTER — Encounter: Payer: Self-pay | Admitting: Adult Health

## 2021-12-15 ENCOUNTER — Non-Acute Institutional Stay (SKILLED_NURSING_FACILITY): Payer: Medicare HMO | Admitting: Adult Health

## 2021-12-15 DIAGNOSIS — E0869 Diabetes mellitus due to underlying condition with other specified complication: Secondary | ICD-10-CM | POA: Diagnosis not present

## 2021-12-15 DIAGNOSIS — E1142 Type 2 diabetes mellitus with diabetic polyneuropathy: Secondary | ICD-10-CM | POA: Diagnosis not present

## 2021-12-15 DIAGNOSIS — D638 Anemia in other chronic diseases classified elsewhere: Secondary | ICD-10-CM | POA: Diagnosis not present

## 2021-12-15 DIAGNOSIS — N183 Chronic kidney disease, stage 3 unspecified: Secondary | ICD-10-CM | POA: Diagnosis not present

## 2021-12-15 DIAGNOSIS — R339 Retention of urine, unspecified: Secondary | ICD-10-CM | POA: Diagnosis not present

## 2021-12-15 DIAGNOSIS — E1122 Type 2 diabetes mellitus with diabetic chronic kidney disease: Secondary | ICD-10-CM | POA: Diagnosis not present

## 2021-12-15 NOTE — Progress Notes (Signed)
?Location:  [place] ?Nursing Home Room Number: 154-W ?Place of Service:  SNF (31) ? ? ?CODE STATUS: DNR ? ?Allergies  ?Allergen Reactions  ? Ace Inhibitors Other (See Comments)  ?  Hyperkalemia   ? Codeine   ? Sulfa Antibiotics   ? ? ?Chief Complaint  ?Patient presents with  ? Medical Management of Chronic Issues ? ?                    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 ? ?HPI: ? 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 She does have numbness in both hands. Does spend nearly all of her time in bed per her choice. There are no reports of changes in appetite; her weight is stable.  ? ?Past Medical History:  ?Diagnosis Date  ? Adult failure to thrive   ? Anemia   ? Anxiety   ? Atherosclerosis of aorta ([place])   ? Central cord syndrome at C4 level of cervical spinal cord, subsequent encounter [facility])   ? CKD (chronic kidney disease)   ? stage 3  ? Depression   ? DM type 2 with diabetic peripheral neuropathy ([place])   ? Dysphagia   ? GERD (gastroesophageal reflux disease)   ? Gout   ? High cholesterol   ? HTN (hypertension)   ? Hyponatremia   ? MDD (major depressive disorder)   ? Neck pain   ? Neuropathy   ? Quadriplegia, C1-C4 incomplete ([place])   ? Radiculopathy   ? RLS (restless legs syndrome)   ? Urinary retention   ? ? ?Past Surgical History:  ?Procedure Laterality Date  ? ABDOMINAL HYSTERECTOMY    ? ANTERIOR CERVICAL DECOMP/DISCECTOMY FUSION N/A 02/05/2021  ? Procedure: Cervical Three-Four  Anterior cervical decompression/discectomy/fusion;  Surgeon: Ashok Pall, MD;  Location: [place];  Service: Neurosurgery;  Laterality: N/A;  RM 20  ? APPENDECTOMY    ? BACK SURGERY    ? BIOPSY  09/22/2021  ? Procedure: BIOPSY;  Surgeon: [name], DO;   [facility];  Service: Endoscopy;;  ? CERVICAL DISC SURGERY    ? ESOPHAGOGASTRODUODENOSCOPY (EGD) WITH PROPOFOL N/A 09/22/2021  ? Procedure: ESOPHAGOGASTRODUODENOSCOPY (EGD) WITH PROPOFOL;  Surgeon: [name], DO;  [facility];  Service: Endoscopy;  Laterality: N/A;  1:00pm  ? HAND SURGERY    ? KNEE SURGERY    ? ? ?Social History  ? ?Socioeconomic History  ? Marital status: Widowed  ?  Spouse name: Not on file  ? Number of children: Not on file  ? Years of education: Not on file  ? Highest education level: Not on file  ?Occupational History  ? Not on file  ?Tobacco Use  ? Smoking status: Never  ? Smokeless tobacco: Never  ?Vaping Use  ? Vaping Use: Never used  ?Substance and Sexual Activity  ? Alcohol use: Never  ? Drug use: Never  ? Sexual activity: Never  ?Other Topics Concern  ? Not on file  ?Social History Narrative  ? Lives at [facility]  ? ?Social Determinants of Health  ? ?Financial Resource Strain: Not on file  ?Food Insecurity: Not on file  ?Transportation Needs: Not on file  ?Physical Activity: Not on file  ?  Stress: Not on file  ?Social Connections: Not on file  ?Intimate Partner Violence: Not on file  ? ?Family History  ?Problem Relation Age of Onset  ? Cancer Mother   ? Cardiomyopathy Father   ? Seizures Sister   ?     childhood  ? Seizures Brother   ?     in his 63s  ? Colon cancer Neg Hx   ? ? ? ? ?VITAL SIGNS ?BP 135/62   Pulse 65   Temp (!) 96.6 ?F (35.9 ?C)   Resp 18   Ht 5\' 2"  (1.575 m)   Wt 161 lb 8 oz (73.3 kg)   SpO2 97%   BMI 29.54 kg/m?  ? ?Outpatient Encounter Medications as of 12/15/2021  ?Medication Sig  ? acetaminophen (TYLENOL) 325 MG tablet Take 650 mg by mouth every 8 (eight) hours.  ? albuterol (VENTOLIN HFA) 108 (90 Base) MCG/ACT inhaler Inhale 1 puff into the lungs every 4 (four) hours as needed for wheezing or shortness of breath.  ? Artificial Saliva (BIOTENE MOISTURIZING MOUTH MT) Use as directed 1 application in the mouth or throat at  bedtime. 9 pm  ? aspirin EC 81 MG tablet Take 81 mg by mouth daily. Swallow whole.9 am  ? atorvastatin (LIPITOR) 20 MG tablet Take 1 tablet (20 mg total) by mouth every evening.  ? Balsam Engineer, materials (VENELEX) OINT Apply topically. Apply to sacrum, coccyx, bilateral buttocks qshift for prevention.  ? BENZONATATE PO Take 100 mg by mouth every 8 (eight) hours as needed (cough).  ? chlorthalidone (HYGROTON) 25 MG tablet Take 25 mg by mouth daily.  ? colchicine 0.6 MG tablet Take 0.6 mg by mouth daily as needed.  ? DULoxetine (CYMBALTA) 60 MG capsule Take 60 mg by mouth daily.  ? fexofenadine (ALLEGRA) 180 MG tablet Take 180 mg by mouth daily.  ? gabapentin (NEURONTIN) 100 MG capsule Take 1 capsule (100 mg total) by mouth 2 (two) times daily.  ? guaiFENesin (MUCINEX) 600 MG 12 hr tablet Take by mouth 2 (two) times daily.  ? hydrocortisone 1 % ointment Apply 1 application topically 2 (two) times daily. Apply to itchy rash on abd and (R) breast twice daily until resolved  ? insulin lispro (HUMALOG) 100 UNIT/ML KwikPen Inject 5 Units into the skin 2 (two) times daily with a meal.  ? Insulin Pen Needle 30G X 5 MM MISC 1 Device by Does not apply route daily. 3/16"  ? lamoTRIgine (LAMICTAL) 25 MG tablet Take 25 mg by mouth daily. DX: Unspecified convulsions  ? LANTUS SOLOSTAR 100 UNIT/ML Solostar Pen Inject 15 Units into the skin at bedtime.  ? levETIRAcetam (KEPPRA) 1000 MG tablet Take 1,000 mg by mouth 2 (two) times daily. 9:00 am and 9:00 pm  ? levETIRAcetam (KEPPRA) 500 MG tablet Take 500 mg by mouth at bedtime. Along with 1000 mg for a total on 1500 mg in the pm (see other listing)  ? Lidocaine-Glycerin (PREPARATION H EX) Apply topically. Maximum Strength (phenyleph-pramoxin-glycr-w.pet) [OTC] cream; 0.25-1 %; rectal ?Special Instructions: As needed for rectal itching/hemorrhoids. ?Once A Day  ? LORazepam (ATIVAN) 2 MG/ML injection Inject 0.5 mLs (1 mg total) into the vein every 15 (fifteen) minutes as needed.  ?  melatonin 3 MG TABS tablet Take 3 mg by mouth at bedtime.  ? metoCLOPramide (REGLAN) 5 MG tablet Take 2.5 mg by mouth 4 (four) times daily.  ? NON FORMULARY Diet - NAS, Cons CHO  ? omeprazole (PRILOSEC) 40 MG capsule Take 40  mg by mouth daily.  ? rOPINIRole (REQUIP) 1 MG tablet Take 1 mg by mouth at bedtime.  ? tiZANidine (ZANAFLEX) 2 MG tablet Take 2 mg by mouth every 6 (six) hours as needed for muscle spasms (for back and sciatic pain).  ? AMBULATORY NON FORMULARY MEDICATION Medication Name:  ?Continue monthly catheter changes with 42fr catheter at skilled nursing facility. (Patient taking differently: Medication Name:  ?Continue monthly catheter changes with 47fr catheter at skilled nursing facility.)  ? mirtazapine (REMERON) 7.5 MG tablet Take 7.5 mg by mouth at bedtime.  ? ?No facility-administered encounter medications on file as of 12/15/2021.  ? ? ? ?SIGNIFICANT DIAGNOSTIC EXAMS ? ? ?PREVIOUS  ? ?12-02-20; ct of head: No acute intracranial abnormality. Polypoid sinus disease. ? ?12-02-20: ct of cervical spine:  ?Postoperative and degenerative changes in the cervical spine. No acute bony abnormality. ? ?12-02-20; ct of pelvis:  ?1. No evidence of acute fracture or dislocation of the pelvis or hips. ?2. 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 ?3. Degenerative changes in the hips with subcortical cysts on the left hip. ? ?12-02-20: ct of left knee:  ?1. Subtle acute nondisplaced fracture through the medial aspect of the head of the fibula. ?2. 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 ?3. There is a small joint effusion. ? ?12-02-20: mri left hip:  ?1. No acute findings involving the left hip. ?2. Mild chronic bilateral hip avascular necrosis. Moderate degenerative hip findings bilaterally. No hip effusion or regional ?bursitis. ?3. Mild left greater than right hamstring tendinopathy. ?4. 1.3 cm left eccentric Bartholin's cyst or Gartner  cyst. ? ?12-02-20: mri lumbar spine:  ?1. 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 ?2. L3-4: Bilateral posterolateral disc bulges, more prominent on the left. Mild left foraminal encroachment that could possibly

## 2021-12-17 DIAGNOSIS — D638 Anemia in other chronic diseases classified elsewhere: Secondary | ICD-10-CM | POA: Insufficient documentation

## 2021-12-19 ENCOUNTER — Encounter: Payer: Self-pay | Admitting: Adult Health

## 2021-12-19 ENCOUNTER — Non-Acute Institutional Stay (SKILLED_NURSING_FACILITY): Payer: Medicare HMO | Admitting: Adult Health

## 2021-12-19 DIAGNOSIS — I700 Atherosclerosis of aorta: Secondary | ICD-10-CM | POA: Diagnosis not present

## 2021-12-19 DIAGNOSIS — G8252 Quadriplegia, C1-C4 incomplete: Secondary | ICD-10-CM

## 2021-12-19 DIAGNOSIS — S14124S Central cord syndrome at C4 level of cervical spinal cord, sequela: Secondary | ICD-10-CM

## 2021-12-19 NOTE — Progress Notes (Signed)
? ?Location:  [place] ?Nursing Home Room Number: 154-W ?Place of Service:  SNF (31) ? ? ?CODE STATUS: dnr  ? ?Allergies  ?Allergen Reactions  ? Ace Inhibitors Other (See Comments)  ?  Hyperkalemia   ? Codeine   ? Sulfa Antibiotics   ? ? ?Chief Complaint  ?Patient presents with  ? Acute Visit  ?  Care plan meeting  ? ? ?HPI: ? ?We have come together for her care plan meeting. Family present. BIMS 13/15 mood 3/30: becomes very tired. She requires extensive to dependent with her adls. Has foley; incontinent of bowel. Spends most of her time in bed; no falls. Dietary: on NAS CON CHO good appetite; weight is 161.5 pounds is stable.  Therapy: none at this time.  She continues to be followed for her chronic illnesses including:  Aortic atherosclerosis Central cord syndrome at C4 level of cervical spinal cord sequela Quadriplegia C1-C4 incomplete ? ?Past Medical History:  ?Diagnosis Date  ? Adult failure to thrive   ? Anemia   ? Anxiety   ? Atherosclerosis of aorta ([place])   ? Central cord syndrome at C4 level of cervical spinal cord, subsequent encounter [facility])   ? CKD (chronic kidney disease)   ? stage 3  ? Depression   ? DM type 2 with diabetic peripheral neuropathy ([place])   ? Dysphagia   ? GERD (gastroesophageal reflux disease)   ? Gout   ? High cholesterol   ? HTN (hypertension)   ? Hyponatremia   ? MDD (major depressive disorder)   ? Neck pain   ? Neuropathy   ? Quadriplegia, C1-C4 incomplete ([place])   ? Radiculopathy   ? RLS (restless legs syndrome)   ? Urinary retention   ? ? ?Past Surgical History:  ?Procedure Laterality Date  ? ABDOMINAL HYSTERECTOMY    ? ANTERIOR CERVICAL DECOMP/DISCECTOMY FUSION N/A 02/05/2021  ? Procedure: Cervical Three-Four  Anterior cervical decompression/discectomy/fusion;  Surgeon: Ashok Pall, MD;  Location: [place];  Service: Neurosurgery;  Laterality: N/A;  RM 20  ? APPENDECTOMY    ? BACK SURGERY    ? BIOPSY  09/22/2021  ? Procedure: BIOPSY;  Surgeon: [name], DO;   [facility];  Service: Endoscopy;;  ? CERVICAL DISC SURGERY    ? ESOPHAGOGASTRODUODENOSCOPY (EGD) WITH PROPOFOL N/A 09/22/2021  ? Procedure: ESOPHAGOGASTRODUODENOSCOPY (EGD) WITH PROPOFOL;  Surgeon: [name], DO;  [facility];  Service: Endoscopy;  Laterality: N/A;  1:00pm  ? HAND SURGERY    ? KNEE SURGERY    ? ? ?Social History  ? ?Socioeconomic History  ? Marital status: Widowed  ?  Spouse name: Not on file  ? Number of children: Not on file  ? Years of education: Not on file  ? Highest education level: Not on file  ?Occupational History  ? Not on file  ?Tobacco Use  ? Smoking status: Never  ? Smokeless tobacco: Never  ?Vaping Use  ? Vaping Use: Never used  ?Substance and Sexual Activity  ? Alcohol use: Never  ? Drug use: Never  ? Sexual activity: Never  ?Other Topics Concern  ? Not on file  ?Social History Narrative  ? Lives at [facility]  ? ?Social Determinants of Health  ? ?Financial Resource Strain: Not on file  ?Food Insecurity: Not on file  ?Transportation Needs: Not on file  ?Physical Activity: Not on file  ?Stress: Not on file  ?Social Connections: Not on file  ?Intimate Partner  Violence: Not on file  ? ?Family History  ?Problem Relation Age of Onset  ? Cancer Mother   ? Cardiomyopathy Father   ? Seizures Sister   ?     childhood  ? Seizures Brother   ?     in his 32s  ? Colon cancer Neg Hx   ? ? ? ? ?VITAL SIGNS ?BP 135/62   Pulse 65   Temp (!) 97.1 ?F (36.2 ?C)   Resp 18   Ht 5\' 2"  (1.575 m)   Wt 161 lb 8 oz (73.3 kg)   SpO2 97%   BMI 29.54 kg/m?  ? ?Outpatient Encounter Medications as of 12/19/2021  ?Medication Sig  ? acetaminophen (TYLENOL) 325 MG tablet Take 650 mg by mouth every 8 (eight) hours.  ? albuterol (VENTOLIN HFA) 108 (90 Base) MCG/ACT inhaler Inhale 1 puff into the lungs every 4 (four) hours as needed for wheezing or shortness of breath.  ? AMBULATORY NON FORMULARY MEDICATION Medication Name:  ?Continue monthly catheter changes with 38fr  catheter at skilled nursing facility. (Patient taking differently: Medication Name:  ?Continue monthly catheter changes with 69fr catheter at skilled nursing facility.)  ? Artificial Saliva (BIOTENE MOISTURIZING MOUTH MT) Use as directed 1 application in the mouth or throat at bedtime. 9 pm  ? aspirin EC 81 MG tablet Take 81 mg by mouth daily. Swallow whole.9 am  ? atorvastatin (LIPITOR) 20 MG tablet Take 1 tablet (20 mg total) by mouth every evening.  ? Balsam Engineer, materials (VENELEX) OINT Apply topically. Apply to sacrum, coccyx, bilateral buttocks qshift for prevention.  ? BENZONATATE PO Take 100 mg by mouth every 8 (eight) hours as needed (cough).  ? chlorthalidone (HYGROTON) 25 MG tablet Take 25 mg by mouth daily.  ? colchicine 0.6 MG tablet Take 0.6 mg by mouth daily as needed.  ? DULoxetine (CYMBALTA) 60 MG capsule Take 60 mg by mouth daily.  ? fexofenadine (ALLEGRA) 180 MG tablet Take 180 mg by mouth daily.  ? gabapentin (NEURONTIN) 100 MG capsule Take 1 capsule (100 mg total) by mouth 2 (two) times daily.  ? guaiFENesin (MUCINEX) 600 MG 12 hr tablet Take by mouth 2 (two) times daily.  ? hydrocortisone 1 % ointment Apply 1 application topically 2 (two) times daily. Apply to itchy rash on abd and (R) breast twice daily until resolved  ? insulin lispro (HUMALOG) 100 UNIT/ML KwikPen Inject 5 Units into the skin 2 (two) times daily with a meal.  ? Insulin Pen Needle 30G X 5 MM MISC 1 Device by Does not apply route daily. 3/16"  ? lamoTRIgine (LAMICTAL) 25 MG tablet Take 25 mg by mouth daily. DX: Unspecified convulsions  ? LANTUS SOLOSTAR 100 UNIT/ML Solostar Pen Inject 15 Units into the skin at bedtime.  ? levETIRAcetam (KEPPRA) 1000 MG tablet Take 1,000 mg by mouth 2 (two) times daily. 9:00 am and 9:00 pm  ? levETIRAcetam (KEPPRA) 500 MG tablet Take 500 mg by mouth at bedtime. Along with 1000 mg for a total on 1500 mg in the pm (see other listing)  ? Lidocaine-Glycerin (PREPARATION H EX) Apply topically.  Maximum Strength (phenyleph-pramoxin-glycr-w.pet) [OTC] cream; 0.25-1 %; rectal ?Special Instructions: As needed for rectal itching/hemorrhoids. ?Once A Day  ? LORazepam (ATIVAN) 2 MG/ML injection Inject 0.5 mLs (1 mg total) into the vein every 15 (fifteen) minutes as needed.  ? melatonin 3 MG TABS tablet Take 3 mg by mouth at bedtime.  ? metoCLOPramide (REGLAN) 5 MG tablet Take 2.5 mg  by mouth 4 (four) times daily.  ? mirtazapine (REMERON) 7.5 MG tablet Take 7.5 mg by mouth at bedtime.  ? NON FORMULARY Diet - NAS, Cons CHO  ? omeprazole (PRILOSEC) 40 MG capsule Take 40 mg by mouth daily.  ? rOPINIRole (REQUIP) 1 MG tablet Take 1 mg by mouth at bedtime.  ? tiZANidine (ZANAFLEX) 2 MG tablet Take 2 mg by mouth every 6 (six) hours as needed for muscle spasms (for back and sciatic pain).  ? ?No facility-administered encounter medications on file as of 12/19/2021.  ? ? ? ?SIGNIFICANT DIAGNOSTIC EXAMS ? ? ?PREVIOUS  ? ?12-02-20; ct of head: No acute intracranial abnormality. Polypoid sinus disease. ? ?12-02-20: ct of cervical spine:  ?Postoperative and degenerative changes in the cervical spine. No acute bony abnormality. ? ?12-02-20; ct of pelvis:  ?1. No evidence of acute fracture or dislocation of the pelvis or hips. ?2. 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 ?3. Degenerative changes in the hips with subcortical cysts on the left hip. ? ?12-02-20: ct of left knee:  ?1. Subtle acute nondisplaced fracture through the medial aspect of the head of the fibula. ?2. 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 ?3. There is a small joint effusion. ? ?12-02-20: mri left hip:  ?1. No acute findings involving the left hip. ?2. Mild chronic bilateral hip avascular necrosis. Moderate degenerative hip findings bilaterally. No hip effusion or regional ?bursitis. ?3. Mild left greater than right hamstring tendinopathy. ?4. 1.3 cm left eccentric Bartholin's cyst or Gartner  cyst. ? ?12-02-20: mri lumbar spine:  ?1. 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 ?2. L3-4: Bilateral posterolateral disc bulges, more prominent on the left. Mild left foraminal encroachment that could possibly af

## 2021-12-19 NOTE — Progress Notes (Signed)
?Location:  [place] ?Nursing Home Room Number: 154-W ?Place of Service:  SNF (31) ? ? ?CODE STATUS: DNR ? ?Allergies  ?Allergen Reactions  ? Ace Inhibitors Other (See Comments)  ?  Hyperkalemia   ? Codeine   ? Sulfa Antibiotics   ? ? ?Chief Complaint  ?Patient presents with  ? Acute Visit  ?  Care plan meeting  ? ? ?HPI: ? ? ? ?Past Medical History:  ?Diagnosis Date  ? Adult failure to thrive   ? Anemia   ? Anxiety   ? Atherosclerosis of aorta ([place])   ? Central cord syndrome at C4 level of cervical spinal cord, subsequent encounter [facility])   ? CKD (chronic kidney disease)   ? stage 3  ? Depression   ? DM type 2 with diabetic peripheral neuropathy ([place])   ? Dysphagia   ? GERD (gastroesophageal reflux disease)   ? Gout   ? High cholesterol   ? HTN (hypertension)   ? Hyponatremia   ? MDD (major depressive disorder)   ? Neck pain   ? Neuropathy   ? Quadriplegia, C1-C4 incomplete ([place])   ? Radiculopathy   ? RLS (restless legs syndrome)   ? Urinary retention   ? ? ?Past Surgical History:  ?Procedure Laterality Date  ? ABDOMINAL HYSTERECTOMY    ? ANTERIOR CERVICAL DECOMP/DISCECTOMY FUSION N/A 02/05/2021  ? Procedure: Cervical Three-Four  Anterior cervical decompression/discectomy/fusion;  Surgeon: Ashok Pall, MD;  Location: [place];  Service: Neurosurgery;  Laterality: N/A;  RM 20  ? APPENDECTOMY    ? BACK SURGERY    ? BIOPSY  09/22/2021  ? Procedure: BIOPSY;  Surgeon: [name], DO;  [facility];  Service: Endoscopy;;  ? CERVICAL DISC SURGERY    ? ESOPHAGOGASTRODUODENOSCOPY (EGD) WITH PROPOFOL N/A 09/22/2021  ? Procedure: ESOPHAGOGASTRODUODENOSCOPY (EGD) WITH PROPOFOL;  Surgeon: [name], DO;  [facility];  Service: Endoscopy;  Laterality: N/A;  1:00pm  ? HAND SURGERY    ? KNEE SURGERY    ? ? ?Social History  ? ?Socioeconomic History  ? Marital status: Widowed  ?  Spouse name: Not on file  ? Number of children: Not on file  ? Years of education: Not on file  ? Highest  education level: Not on file  ?Occupational History  ? Not on file  ?Tobacco Use  ? Smoking status: Never  ? Smokeless tobacco: Never  ?Vaping Use  ? Vaping Use: Never used  ?Substance and Sexual Activity  ? Alcohol use: Never  ? Drug use: Never  ? Sexual activity: Never  ?Other Topics Concern  ? Not on file  ?Social History Narrative  ? Lives at [facility]  ? ?Social Determinants of Health  ? ?Financial Resource Strain: Not on file  ?Food Insecurity: Not on file  ?Transportation Needs: Not on file  ?Physical Activity: Not on file  ?Stress: Not on file  ?Social Connections: Not on file  ?Intimate Partner Violence: Not on file  ? ?Family History  ?Problem Relation Age of Onset  ? Cancer Mother   ? Cardiomyopathy Father   ? Seizures Sister   ?     childhood  ? Seizures Brother   ?     in his 58s  ? Colon cancer Neg Hx   ? ? ? ? ?VITAL SIGNS ?BP 135/62   Pulse 65   Temp (!) 97.1 ?F (36.2 ?C)   Resp 18   Ht 5\' 2"  (1.575  m)   Wt 161 lb 8 oz (73.3 kg)   SpO2 97%   BMI 29.54 kg/m?  ? ?Outpatient Encounter Medications as of 12/19/2021  ?Medication Sig  ? acetaminophen (TYLENOL) 325 MG tablet Take 650 mg by mouth every 8 (eight) hours.  ? albuterol (VENTOLIN HFA) 108 (90 Base) MCG/ACT inhaler Inhale 1 puff into the lungs every 4 (four) hours as needed for wheezing or shortness of breath.  ? Artificial Saliva (BIOTENE MOISTURIZING MOUTH MT) Use as directed 1 application in the mouth or throat at bedtime. 9 pm  ? aspirin EC 81 MG tablet Take 81 mg by mouth daily. Swallow whole.9 am  ? atorvastatin (LIPITOR) 20 MG tablet Take 1 tablet (20 mg total) by mouth every evening.  ? Balsam Engineer, materials (VENELEX) OINT Apply topically. Apply to sacrum, coccyx, bilateral buttocks qshift for prevention.  ? BENZONATATE PO Take 100 mg by mouth every 8 (eight) hours as needed (cough).  ? chlorthalidone (HYGROTON) 25 MG tablet Take 25 mg by mouth daily.  ? colchicine 0.6 MG tablet Take 0.6 mg by mouth daily as needed.  ?  DULoxetine (CYMBALTA) 60 MG capsule Take 60 mg by mouth daily.  ? fexofenadine (ALLEGRA) 180 MG tablet Take 180 mg by mouth daily.  ? gabapentin (NEURONTIN) 100 MG capsule Take 1 capsule (100 mg total) by mouth 2 (two) times daily.  ? guaiFENesin (MUCINEX) 600 MG 12 hr tablet Take by mouth 2 (two) times daily.  ? hydrocortisone 1 % ointment Apply 1 application topically 2 (two) times daily. Apply to itchy rash on abd and (R) breast twice daily until resolved  ? insulin lispro (HUMALOG) 100 UNIT/ML KwikPen Inject 5 Units into the skin 2 (two) times daily with a meal.  ? Insulin Pen Needle 30G X 5 MM MISC 1 Device by Does not apply route daily. 3/16"  ? lamoTRIgine (LAMICTAL) 25 MG tablet Take 25 mg by mouth daily. DX: Unspecified convulsions  ? LANTUS SOLOSTAR 100 UNIT/ML Solostar Pen Inject 15 Units into the skin at bedtime.  ? levETIRAcetam (KEPPRA) 1000 MG tablet Take 1,000 mg by mouth 2 (two) times daily. 9:00 am and 9:00 pm  ? levETIRAcetam (KEPPRA) 500 MG tablet Take 500 mg by mouth at bedtime. Along with 1000 mg for a total on 1500 mg in the pm (see other listing)  ? Lidocaine-Glycerin (PREPARATION H EX) Apply topically. Maximum Strength (phenyleph-pramoxin-glycr-w.pet) [OTC] cream; 0.25-1 %; rectal ?Special Instructions: As needed for rectal itching/hemorrhoids. ?Once A Day  ? LORazepam (ATIVAN) 2 MG/ML injection Inject 0.5 mLs (1 mg total) into the vein every 15 (fifteen) minutes as needed.  ? melatonin 3 MG TABS tablet Take 3 mg by mouth at bedtime.  ? metoCLOPramide (REGLAN) 5 MG tablet Take 5 mg by mouth 4 (four) times daily.  ? NON FORMULARY Diet - NAS, Cons CHO  ? omeprazole (PRILOSEC) 40 MG capsule Take 40 mg by mouth daily.  ? rOPINIRole (REQUIP) 1 MG tablet Take 1 mg by mouth at bedtime.  ? tiZANidine (ZANAFLEX) 2 MG tablet Take 2 mg by mouth every 6 (six) hours as needed for muscle spasms (for back and sciatic pain).  ? AMBULATORY NON FORMULARY MEDICATION Medication Name:  ?Continue monthly catheter  changes with 21fr catheter at skilled nursing facility. (Patient taking differently: Medication Name:  ?Continue monthly catheter changes with 59fr catheter at skilled nursing facility.)  ? mirtazapine (REMERON) 7.5 MG tablet Take 7.5 mg by mouth at bedtime. (Patient not taking: Reported on 12/19/2021)  ? ?  No facility-administered encounter medications on file as of 12/19/2021.  ? ? ? ?SIGNIFICANT DIAGNOSTIC EXAMS ? ? ? ? ? ? ?ASSESSMENT/ PLAN: ? ? ? ? ?[name] NP ?[facility]  ?Contact [phone] Monday through Friday 8am- 5pm  ?After hours call [phone]  ? ?

## 2021-12-30 ENCOUNTER — Encounter: Payer: Self-pay | Admitting: Internal Medicine

## 2022-01-07 ENCOUNTER — Ambulatory Visit (INDEPENDENT_AMBULATORY_CARE_PROVIDER_SITE_OTHER): Payer: Medicare HMO | Admitting: Adult Health

## 2022-01-07 ENCOUNTER — Encounter: Payer: Self-pay | Admitting: Adult Health

## 2022-01-07 VITALS — BP 125/78 | HR 79

## 2022-01-07 DIAGNOSIS — G40909 Epilepsy, unspecified, not intractable, without status epilepticus: Secondary | ICD-10-CM

## 2022-01-07 NOTE — Patient Instructions (Addendum)
Your Plan: ? ? ?Continue dose of lamictal 25mg  daily ? ?Continue Keppra 1000 mg AM and 1500 mg PM ? ?She does not wish to make changes to current regimen at this time - she was advised to call if she would like to further adjust anti seizure medications  ? ?Please notify office with any additional seizures ? ? ? ? ?Follow up in 6 months or call earlier if needed ? ? ? ? ? ? ?Thank you for coming to see [place] at [facility]. I hope we have been able to provide you high quality care today. ? ?You may receive a patient satisfaction survey over the next few weeks. We would appreciate your feedback and comments so that we may continue to improve ourselves and the health of our patients. ? ?

## 2022-01-07 NOTE — Progress Notes (Signed)
?[facility] ?[address] ?[place]. [place] [address] ?[phone] ? ?     OFFICE FOLLOW UP NOTE ? ?[name] ?Date of Birth:  1935/12/25 ?Medical Record Number:  [id number]  ? ? ?Primary neurologist: Dr. Rexene Alberts ?Reason for visit: Seizure disorder ? ? ? ?SUBJECTIVE: ? ? ?CHIEF COMPLAINT:  ?Chief Complaint  ?Patient presents with  ? Follow-up  ?  RM 3 with daughter [name]  ?Pt is well, daughter states she has had at least 4 sz since last visit   ? ? ?HPI:  ? ?[name] is a 86 y.o. female who is being followed in this office by Dr. Rexene Alberts for seizure disorder since her teenage years.  She is a permanent resident of [facility] with diagnoses of adult failure to thrive, chronic anemia, arthrosclerosis, cervical cord syndrome with quadriplegia, DM with stage III CKD, HLD, HTN and depression ? ? ?Update 01/07/2022 JM: Returns for 39-month follow-up visit accompanied by her daughter [name].  She continues to reside at [facility]. Per daughter, has had at least 4 seizures since prior visit.  Per EMR review, SNF noted seizure activity on 2/25 requiring dose of IM Ativan.  No specific triggers identified. SNF continued Keppra 1000 mg AM and 1500 mg PM and increased lamotrigine to 25 mg daily (previously 12.5 mg daily).  Per daughter, additional seizures since that time with most recent last month.  Per SNF med list, she has continued on lamotrigine 25 mg daily and Keppra 1000/1500, denies side effects. She does not wish to further make adjust to any medications as she feels as though she is already taking too many. Completed EEG 2/16 with no epileptiform discharges seen or any other abnormalities.  No further concerns at this time. ? ? ? ? ?History from Dr. Rexene Alberts 2 medications consult visit on 10/07/2021 copied for reference purposes only ?[name] is an 86 year old right-handed woman with an underlying complex medical history of chronic kidney disease, central  cord syndrome diabetes, gout, reflux disease, hypertension, hyperlipidemia, hyponatremia, depression, anemia, overweight state, anxiety, status post multiple surgeries including neck surgery, low back surgery, knee surgery, hand surgery, hysterectomy, appendectomy, who reports a longstanding history of seizures.  She also reports a family history of seizures affecting her sister who had seizures starting at age 73 years old.  Patient reports that she was on Dilantin in the past.  She had recurrence of seizures approximately last year as I understand. ?I reviewed your office note from 07/28/2021, at which time she saw Fenton Malling, NP.  Of note, she is on multiple medications including lorazepam as needed, atorvastatin, Biotene as needed, chlorthalidone, colchicine, Cymbalta, gabapentin, Humalog, Lantus, melatonin, mirtazapine, omeprazole, Reglan, ropinirole, tizanidine as needed.  She is on Ventolin as needed. ?Per daughter, she does not have any tonic-clonic seizures, her seizures consist of involuntary mouth movements, decreased consciousness, or loss of awareness, lipsmacking, and drawing of her arms upwards above her head. ?She is wheelchair-bound and since last year has not been able to walk.  She has an indwelling catheter.  Per daughter, she hydrates well with water.  She has limited caffeine, 1 cup of coffee in the mornings typically.  She does not drink any alcohol.  She resides at [facility], skilled nursing facility. ?  ?I had evaluated her over 2 years ago at the request of Dr. Redmond Baseman in ENT for her balance problems and gait disorder.  She has since then missed 2 appointments.  She has been seen by Dr. Merlene Laughter in the interim in 2021 but office notes were not available for my review.  She was recently seen in the emergency room on 09/28/2021 with seizures.  She presented from the [facility].  She received multiple doses of Ativan.  I reviewed the emergency room records.  She had a head CT without  contrast on 09/28/2021 and I reviewed the results: Impression: No acute intracranial findings are seen in noncontrast CT brain.  Chronic sinusitis.  Bilateral mastoid effusions. ?  ?She had a head CT without contrast on 09/14/2021 with the indication of seizure today, altered mental status.  I reviewed the results: Impression: No acute intracranial abnormality.  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  New left sphenoid sinus air-fluid level, which may be seen with acute sinusitis.  She had a brain MRI with and without contrast on 07/07/2021 with indication of possible seizure.  Abnormal neurological exam.  I reviewed the results: Impression: No acute intracranial finding. No sign of acute infarction. No lesion seen to explain seizure. The patient does have mild chronic small-vessel ischemic changes considering age affecting the pons, thalami and cerebral hemispheric white matter. ?Left mastoid effusion. ?She has been on Keppra and Lamictal.  The Lamictal was started in late January 2023 by her primary care.  In the emergency room on 09/28/2021 she was given IV Keppra 1500 mg x 1.  She was advised to continue with Keppra and Lamictal, Keppra was increased to 1000 mg in the morning and 1500 mg in the evening.  Urinalysis was nonsuspicious for a UTI.  Laboratory evaluation showed a magnesium level of 1.8, sodium mildly low at 133, potassium 5.1, glucose 160, BUN 57, creatinine 1.33, CBC showed WBC of 6.2, hemoglobin was low at 9, hematocrit low at 27.9.  Repeat blood sugar 148. ?She missed interim appointments on appointment on 01/02/20 and 01/30/20. ?  ?Previously:  ?  ?08/17/19: (She) reports problems with her balance for the past 2 years.  She reports that she was diagnosed with diabetes in the 70s and she started noticing occasional problems with walking straight, she would at times severe in one direction.  In the recent past she has had 2 falls.  She was in the home both times.  She fell backwards recently and hit her head  against the corner of the furnace she says.  She had bent down to pick something up and when she stood up she overshot.  This happened on 07/02/2019.  On 1123, she fell in the bathroom after standing up from the toilet commode.  She does have occasional lightheadedness when standing up quickly.  She has a 4 pronged cane available and a walker from when she had back and neck surgeries but has not been using either.  She hydrates well with water, estimates that she drinks 3-4 bottles of water per day and up to 2 bottles per night.  She lives alone, she is widowed, she has  4 grown children.  She has a call alert button.  She is a non-smoker and does not drink alcohol and does not drink caffeine on a daily basis.  Her latest A1c was either 8 or 9 or 11 per her recollection.  She had blood work through her primary care physician in October.  I reviewed your office note from 07/06/2019.  She denies any recurrent headaches or vertiginous symptoms such as spinning sensation or nausea.  She denies any sudden onset of one-sided weakness or numbness or  tingling or droopy face or slurring of speech.  She has had intermittent tingling sensation in her feet and left hand. ? ? ? ? ? ? ?ROS:   ?14 system review of systems performed and negative with exception of those listed in HPI ? ?PMH:  ?Past Medical History:  ?Diagnosis Date  ? Adult failure to thrive   ? Anemia   ? Anxiety   ? Atherosclerosis of aorta ([place])   ? Central cord syndrome at C4 level of cervical spinal cord, subsequent encounter [facility])   ? CKD (chronic kidney disease)   ? stage 3  ? Depression   ? DM type 2 with diabetic peripheral neuropathy ([place])   ? Dysphagia   ? GERD (gastroesophageal reflux disease)   ? Gout   ? High cholesterol   ? HTN (hypertension)   ? Hyponatremia   ? MDD (major depressive disorder)   ? Neck pain   ? Neuropathy   ? Quadriplegia, C1-C4 incomplete ([place])   ? Radiculopathy   ? RLS (restless legs syndrome)   ? Urinary retention   ? ? ?PSH:  ?Past  Surgical History:  ?Procedure Laterality Date  ? ABDOMINAL HYSTERECTOMY    ? ANTERIOR CERVICAL DECOMP/DISCECTOMY FUSION N/A 02/05/2021  ? Procedure: Cervical Three-Four  Anterior cervical decompression/discec

## 2022-01-12 ENCOUNTER — Encounter: Payer: Self-pay | Admitting: Adult Health

## 2022-01-12 ENCOUNTER — Non-Acute Institutional Stay (SKILLED_NURSING_FACILITY): Payer: Medicare HMO | Admitting: Adult Health

## 2022-01-12 DIAGNOSIS — E43 Unspecified severe protein-calorie malnutrition: Secondary | ICD-10-CM

## 2022-01-12 DIAGNOSIS — R569 Unspecified convulsions: Secondary | ICD-10-CM

## 2022-01-12 DIAGNOSIS — G2581 Restless legs syndrome: Secondary | ICD-10-CM

## 2022-01-12 DIAGNOSIS — S14124S Central cord syndrome at C4 level of cervical spinal cord, sequela: Secondary | ICD-10-CM

## 2022-01-12 DIAGNOSIS — G8252 Quadriplegia, C1-C4 incomplete: Secondary | ICD-10-CM

## 2022-01-12 NOTE — Progress Notes (Signed)
?Location:  [place] ?Nursing Home [facility] [place] [id number] ?Provider:  [name], NP ? ?CODE STATUS: DNR ? ?Allergies  ?Allergen Reactions  ? Ace Inhibitors Other (See Comments)  ?  Hyperkalemia   ? Codeine   ? Sulfa Antibiotics   ? ? ?Chief Complaint  ?Patient presents with  ? Medical Management of Chronic Issues  ?                ?                    Chronic anxiety:  Protein calorie malnutrition severe:  Aortic atherosclerosis   Central cord syndrome at C4 level of cervical spine compression subsequent encounter/ cord compression quadriplegia C1-4 incomplete;   Restless leg syndrome:  ? ? ?HPI: ? ?She is a 86 year old long term resident of this facility being seen for the management of her chronic illnesses:  Chronic anxiety:  Protein calorie malnutrition severe:  Aortic atherosclerosis   Central cord syndrome at C4 level of cervical spine compression subsequent encounter/ cord compression quadriplegia C1-4 incomplete;   Restless leg syndrome. There are no reports of uncontrolled pain. She is getting out of bed daily. Her weight is stable.  ? ?Past Medical History:  ?Diagnosis Date  ? Adult failure to thrive   ? Anemia   ? Anxiety   ? Atherosclerosis of aorta ([place])   ? Central cord syndrome at C4 level of cervical spinal cord, subsequent encounter [facility])   ? CKD (chronic kidney disease)   ? stage 3  ? Depression   ? DM type 2 with diabetic peripheral neuropathy ([place])   ? Dysphagia   ? GERD (gastroesophageal reflux disease)   ? Gout   ? High cholesterol   ? HTN (hypertension)   ? Hyponatremia   ? MDD (major depressive disorder)   ? Neck pain   ? Neuropathy   ? Quadriplegia, C1-C4 incomplete ([place])   ? Radiculopathy   ? RLS (restless legs syndrome)   ? Urinary retention   ? ? ?Past Surgical History:  ?Procedure Laterality Date  ? ABDOMINAL HYSTERECTOMY    ? ANTERIOR CERVICAL DECOMP/DISCECTOMY FUSION N/A 02/05/2021  ? Procedure: Cervical Three-Four  Anterior cervical  decompression/discectomy/fusion;  Surgeon: Ashok Pall, MD;  Location: [facility];  Service: Neurosurgery;  Laterality: N/A;  RM 20  ? APPENDECTOMY    ? BACK SURGERY    ? BIOPSY  09/22/2021  ? Procedure: BIOPSY;  Surgeon: [name], DO;  [facility];  Service: Endoscopy;;  ? CERVICAL DISC SURGERY    ? ESOPHAGOGASTRODUODENOSCOPY (EGD) WITH PROPOFOL N/A 09/22/2021  ? Procedure: ESOPHAGOGASTRODUODENOSCOPY (EGD) WITH PROPOFOL;  Surgeon: [name], DO;  [facility];  Service: Endoscopy;  Laterality: N/A;  1:00pm  ? HAND SURGERY    ? KNEE SURGERY    ? ? ?Social History  ? ?Socioeconomic History  ? Marital status: Widowed  ?  Spouse name: Not on file  ? Number of children: Not on file  ? Years of education: Not on file  ? Highest education level: Not on file  ?Occupational History  ? Not on file  ?Tobacco Use  ? Smoking status: Never  ? Smokeless tobacco: Never  ?Vaping Use  ? Vaping Use: Never used  ?Substance and Sexual Activity  ? Alcohol use: Never  ? Drug use: Never  ? Sexual activity: Never  ?Other Topics Concern  ? Not  on file  ?Social History Narrative  ? Lives at [facility]  ? ?Social Determinants of Health  ? ?Financial Resource Strain: Not on file  ?Food Insecurity: Not on file  ?Transportation Needs: Not on file  ?Physical Activity: Not on file  ?Stress: Not on file  ?Social Connections: Not on file  ?Intimate Partner Violence: Not on file  ? ?Family History  ?Problem Relation Age of Onset  ? Cancer Mother   ? Cardiomyopathy Father   ? Seizures Sister   ?     childhood  ? Seizures Brother   ?     in his 72s  ? Colon cancer Neg Hx   ? ? ? ? ?VITAL SIGNS ?BP 140/62   Pulse 79   Temp (!) 97 ?F (36.1 ?C)   Ht 5\' 2"  (1.575 m)   Wt 157 lb 1.6 oz (71.3 kg)   BMI 28.73 kg/m?  ? ?Outpatient Encounter Medications as of 01/12/2022  ?Medication Sig  ? acetaminophen (TYLENOL) 325 MG tablet Take 650 mg by mouth every 8 (eight) hours.  ? albuterol (VENTOLIN HFA) 108 (90 Base)  MCG/ACT inhaler Inhale 1 puff into the lungs every 4 (four) hours as needed for wheezing or shortness of breath.  ? AMBULATORY NON FORMULARY MEDICATION Medication Name:  ?Continue monthly catheter changes with 62fr catheter at skilled nursing facility.  ? Artificial Saliva (BIOTENE MOISTURIZING MOUTH MT) Use as directed 1 application. in the mouth or throat at bedtime. 9 pm  ? aspirin EC 81 MG tablet Take 81 mg by mouth daily. Swallow whole.9 am  ? atorvastatin (LIPITOR) 20 MG tablet Take 1 tablet (20 mg total) by mouth every evening.  ? Balsam Engineer, materials (VENELEX) OINT Apply topically. Apply to sacrum, coccyx, bilateral buttocks qshift for prevention.  ? BENZONATATE PO Take 100 mg by mouth every 8 (eight) hours as needed (cough).  ? chlorthalidone (HYGROTON) 25 MG tablet Take 25 mg by mouth daily.  ? colchicine 0.6 MG tablet Take 0.6 mg by mouth daily as needed.  ? DULoxetine (CYMBALTA) 60 MG capsule Take 60 mg by mouth daily.  ? fexofenadine (ALLEGRA) 180 MG tablet Take 180 mg by mouth daily.  ? gabapentin (NEURONTIN) 100 MG capsule Take 1 capsule (100 mg total) by mouth 2 (two) times daily.  ? guaiFENesin (MUCINEX) 600 MG 12 hr tablet Take by mouth 2 (two) times daily.  ? hydrocortisone 1 % ointment Apply 1 application. topically 2 (two) times daily. Apply to itchy rash on abd and (R) breast twice daily until resolved  ? insulin lispro (HUMALOG) 100 UNIT/ML KwikPen Inject 5 Units into the skin 2 (two) times daily with a meal.  ? Insulin Pen Needle 30G X 5 MM MISC 1 Device by Does not apply route daily. 3/16"  ? lamoTRIgine (LAMICTAL) 25 MG tablet Take 25 mg by mouth daily. DX: Unspecified convulsions  ? LANTUS SOLOSTAR 100 UNIT/ML Solostar Pen Inject 15 Units into the skin at bedtime.  ? levETIRAcetam (KEPPRA) 1000 MG tablet Take 1,000 mg by mouth daily. 9:00 am and 9:00 pm  ? levETIRAcetam (KEPPRA) 500 MG tablet Take 500 mg by mouth at bedtime. Along with 1000 mg for a total on 1500 mg in the pm (see other  listing)  ? Lidocaine-Glycerin (PREPARATION H EX) Apply topically. Maximum Strength (phenyleph-pramoxin-glycr-w.pet) [OTC] cream; 0.25-1 %; rectal ?Special Instructions: As needed for rectal itching/hemorrhoids. ?Once A Day  ? LORazepam (ATIVAN) 2 MG/ML injection Inject 0.5 mLs (1 mg total) into the  vein every 15 (fifteen) minutes as needed.  ? melatonin 3 MG TABS tablet Take 3 mg by mouth at bedtime.  ? metoCLOPramide (REGLAN) 5 MG tablet Take 5 mg by mouth 4 (four) times daily.  ? NON FORMULARY Diet - NAS, Cons CHO  ? omeprazole (PRILOSEC) 40 MG capsule Take 40 mg by mouth daily.  ? rOPINIRole (REQUIP) 1 MG tablet Take 1 mg by mouth at bedtime.  ? tiZANidine (ZANAFLEX) 2 MG tablet Take 2 mg by mouth every 6 (six) hours as needed for muscle spasms (for back and sciatic pain).  ? ?No facility-administered encounter medications on file as of 01/12/2022.  ? ? ? ?SIGNIFICANT DIAGNOSTIC EXAMS ? ? ?PREVIOUS  ? ?01-25-21: ct of head; maxillofacial cervical spine:  ?1. No acute intracranial pathology. ?2. No acute/traumatic cervical spine pathology. ?3. No acute facial bone fractures. ? ?01-26-21: ct of chest abdomen and pelvis:  ?1. No noncontrast CT evidence of acute traumatic injury to the chest, abdomen, or pelvis. ?2. Distended urinary bladder. Mild bilateral hydronephrosis and hydroureter to the bilateral ureterovesicular junctions. No obstructing calculus or other etiology identified. Correlate for urinary retention. ?3. No fracture or dislocation of the lumbar spine. Moderate disc space height loss and osteophytosis at L5-S1. Probable small broad-based posterior disc bulges at L4-L5 and L5-S1. ?4. Coronary artery disease. ?Aortic Atherosclerosis ? ?01-26-21: ct of right shoulder:  ?1.  No acute osseous injury of the right shoulder. ?2. Mild mineralization in the infraspinatus tendon as can be seen with calcific tendinosis. ? ?01-29-21: MRI of spine:  ?Postoperative and multilevel degenerative changes of the cervical  spine. There is severe canal stenosis with cord compression at C3-C4. Abnormal cord signal is present just below this level (at operative levels) and may reflect edema or myelomalacia ?No significant degenerative changes of

## 2022-01-15 ENCOUNTER — Other Ambulatory Visit (HOSPITAL_COMMUNITY)
Admission: RE | Admit: 2022-01-15 | Discharge: 2022-01-15 | Disposition: A | Discharge status: Home / Self Care | Payer: Medicare HMO | Source: Skilled Nursing Facility | Attending: Adult Health | Admitting: Adult Health

## 2022-01-15 DIAGNOSIS — E1122 Type 2 diabetes mellitus with diabetic chronic kidney disease: Secondary | ICD-10-CM | POA: Diagnosis not present

## 2022-01-15 LAB — LIPID PANEL
Cholesterol: 148 mg/dL (ref 0–200)
HDL: 39 mg/dL — ABNORMAL LOW (ref >40)
LDL Cholesterol: 87 mg/dL (ref 0–99)
Total CHOL/HDL Ratio: 3.8 RATIO
Triglycerides: 112 mg/dL (ref <150)
VLDL: 22 mg/dL (ref 0–40)

## 2022-01-15 LAB — HEMOGLOBIN A1C
Hgb A1c MFr Bld: 6.7 % — ABNORMAL HIGH (ref 4.8–5.6)
Mean Plasma Glucose: 145.59 mg/dL

## 2022-01-15 LAB — TSH: TSH: 5.663 u[IU]/mL — ABNORMAL HIGH (ref 0.350–4.500)

## 2022-01-19 DIAGNOSIS — M4802 Spinal stenosis, cervical region: Secondary | ICD-10-CM | POA: Diagnosis not present

## 2022-02-06 ENCOUNTER — Non-Acute Institutional Stay (SKILLED_NURSING_FACILITY): Payer: Medicare HMO | Admitting: Internal Medicine

## 2022-02-06 ENCOUNTER — Ambulatory Visit (HOSPITAL_COMMUNITY)
Admission: RE | Admit: 2022-02-06 | Discharge: 2022-02-06 | Disposition: A | Discharge status: Home / Self Care | Payer: Medicare HMO | Source: Ambulatory Visit | Attending: Neurosurgery | Admitting: Neurosurgery

## 2022-02-06 ENCOUNTER — Encounter: Payer: Self-pay | Admitting: Internal Medicine

## 2022-02-06 DIAGNOSIS — M4802 Spinal stenosis, cervical region: Secondary | ICD-10-CM | POA: Insufficient documentation

## 2022-02-06 DIAGNOSIS — E1122 Type 2 diabetes mellitus with diabetic chronic kidney disease: Secondary | ICD-10-CM

## 2022-02-06 DIAGNOSIS — I129 Hypertensive chronic kidney disease with stage 1 through stage 4 chronic kidney disease, or unspecified chronic kidney disease: Secondary | ICD-10-CM

## 2022-02-06 DIAGNOSIS — G8252 Quadriplegia, C1-C4 incomplete: Secondary | ICD-10-CM | POA: Diagnosis not present

## 2022-02-06 DIAGNOSIS — E785 Hyperlipidemia, unspecified: Secondary | ICD-10-CM | POA: Diagnosis not present

## 2022-02-06 DIAGNOSIS — R7989 Other specified abnormal findings of blood chemistry: Secondary | ICD-10-CM | POA: Insufficient documentation

## 2022-02-06 DIAGNOSIS — E1169 Type 2 diabetes mellitus with other specified complication: Secondary | ICD-10-CM

## 2022-02-06 DIAGNOSIS — N189 Chronic kidney disease, unspecified: Secondary | ICD-10-CM | POA: Diagnosis not present

## 2022-02-06 DIAGNOSIS — N1831 Chronic kidney disease, stage 3a: Secondary | ICD-10-CM

## 2022-02-06 DIAGNOSIS — M542 Cervicalgia: Secondary | ICD-10-CM | POA: Diagnosis not present

## 2022-02-06 DIAGNOSIS — Z862 Personal history of diseases of the blood and blood-forming organs and certain disorders involving the immune mechanism: Secondary | ICD-10-CM

## 2022-02-06 DIAGNOSIS — Z794 Long term (current) use of insulin: Secondary | ICD-10-CM

## 2022-02-06 DIAGNOSIS — N1832 Chronic kidney disease, stage 3b: Secondary | ICD-10-CM | POA: Diagnosis not present

## 2022-02-06 DIAGNOSIS — D638 Anemia in other chronic diseases classified elsewhere: Secondary | ICD-10-CM

## 2022-02-06 NOTE — Assessment & Plan Note (Signed)
Current TSH is minimally elevated at 5.663; this is clinically not significant but warrants monitor. This can be rechecked in 8-10 weeks after atorvastatin was changed to rosuvastatin 20 mg daily.

## 2022-02-06 NOTE — Assessment & Plan Note (Deleted)
Anemia in  the context of CKD stage IIIb is essentially stable with current values of 8.7/27.5.  No bleeding dyscrasias reported by staff.  Continue to monitor.

## 2022-02-06 NOTE — Patient Instructions (Signed)
See assessment and plan under each diagnosis in the problem list and acutely for this visit 

## 2022-02-06 NOTE — Assessment & Plan Note (Signed)
LDL is 87 with a goal less than 70.  This is on atorvastatin 20 mg daily.  Consideration can be given to changing her to rosuvastatin 20 mg daily with follow-up lipids and repeat TSH

## 2022-02-06 NOTE — Assessment & Plan Note (Addendum)
BP controlled; no change in antihypertensive medications CKD is now stage IIIb with a creatinine of 1.33 and GFR of 40.  This has been relatively stable.  No change indicated, but continue to monitor as she is on polypharmacy. If CKD progresses Gabapentin & Chlorthalidone dosages will be re-evaluated.

## 2022-02-06 NOTE — Assessment & Plan Note (Signed)
Current A1c is 6.7% indicating excellent control.  Glucoses ranged from a low of 93 up to high of 150 with an average of 113.  No change indicated unless she is having hypoglycemic episodes.

## 2022-02-06 NOTE — Progress Notes (Unsigned)
NURSING HOME LOCATION:  [facility] ROOM NUMBER:  [id number] W  CODE STATUS:  DNR  PCP: [name] NP,PSC  This is a nursing facility follow up visit of chronic medical diagnoses & to document compliance with Regulation 483.30 (c) in [employer] [place] Manual Phase 2 which mandates caregiver visit ( visits can alternate among physician, PA or NP as per statutes) within 10 days of 30 days / 60 days/ 90 days post admission to SNF date    Interim medical record and care since last SNF visit was updated with review of diagnostic studies and change in clinical status since last visit were documented.  HPI: She is a permanent resident of this facility with medical diagnoses of adult failure to thrive, chronic anemia, aortic atherosclerosis, C4 central cord syndrome, diabetes with stage III CKD, GERD, history of gout, dyslipidemia, essential hypertension, RLS, and major depressive disorder. She has had anterior cervical discectomy and fusion.  Lipids are current and reveal a HDL of 39 and LDL of 87.  A1c is also current and reveals a value of 6.7% indicating excellent control.  Glucoses range from a low of 93 up to a high of 150 with an average of 113.  TSH is minimally elevated at 5.663.  No prior value is available for reference. Chemistries electrolytes were last checked in January.  At that time CKD stage IIIb was present with a creatinine 1.33 and GFR of 40.  Serially this was essentially stable.  Anemia was also essentially stable with most recent values of 8.7/27.5.  Total protein and albumin are normal.  Review of systems: She stated that she had had an MRI today and indeed that is the case.  She states that her numbness is getting worse and this is associated with decreased grip in her hands.  She feels she needs to see Dr. Christella Noa as soon as possible.   Despite the significant anemia; she denies any bleeding dyscrasias. Other symptoms include poor appetite.  She states  she takes Ensure once in a while. She stays cold, especially in her hands.  She describes "a little bit of constipation, right much.".  She states that she has a "sore on the bottom, they put a patch.". She validates being told she snores but denies any apnea.  Constitutional: No fever, significant weight change, fatigue  Eyes: No redness, discharge, pain, vision change ENT/mouth: No nasal congestion,  purulent discharge, earache, change in hearing, sore throat  Cardiovascular: No chest pain, palpitations, paroxysmal nocturnal dyspnea, claudication, edema  Respiratory: No cough, sputum production, hemoptysis, DOE, significant snoring, apnea   Gastrointestinal: No heartburn, dysphagia, abdominal pain, nausea /vomiting, rectal bleeding, melena, change in bowels Genitourinary: No dysuria, hematuria, pyuria, incontinence, nocturia Musculoskeletal: No joint stiffness, joint swelling, weakness, pain Dermatologic: No rash, pruritus, change in appearance of skin Neurologic: No dizziness, headache, syncope, seizures, numbness, tingling Psychiatric: No significant anxiety, depression, insomnia, anorexia Endocrine: No change in hair/skin/nails, excessive thirst, excessive hunger, excessive urination  Hematologic/lymphatic: No significant bruising, lymphadenopathy, abnormal bleeding Allergy/immunology: No itchy/watery eyes, significant sneezing, urticaria, angioedema  Physical exam:  Pertinent or positive findings: She was asleep when entering the room and exhibited hypopnea and some apnea and minimal snoring.  She was easily arousable.  Upon awakening her affect was flat.  She has complete dentures.  Second heart sound is louder than the first heart sound.  Abdomen is protuberant.  Pedal pulses are decreased.  She has decreased strength and opposition in all  extremities which seems fairly's symmetric.  She has trace edema at the sock line.  She states that my hands felt cool in her upper extremities but she  did not feel them on the lower extremities.  General appearance: Adequately nourished; no acute distress, increased work of breathing is present.   Lymphatic: No lymphadenopathy about the head, neck, axilla. Eyes: No conjunctival inflammation or lid edema is present. There is no scleral icterus. Ears:  External ear exam shows no significant lesions or deformities.   Nose:  External nasal examination shows no deformity or inflammation. Nasal mucosa are pink and moist without lesions, exudates Oral exam:  Lips and gums are healthy appearing. There is no oropharyngeal erythema or exudate. Neck:  No thyromegaly, masses, tenderness noted.    Heart:  Normal rate and regular rhythm. S1 and S2 normal without gallop, murmur, click, rub .  Lungs: Chest clear to auscultation without wheezes, rhonchi, rales, rubs. Abdomen: Bowel sounds are normal. Abdomen is soft and nontender with no organomegaly, hernias, masses. GU: Deferred  Extremities:  No cyanosis, clubbing, edema  Neurologic exam : Cn 2-7 intact Strength equal  in upper & lower extremities Balance, [place], finger to nose testing could not be completed due to clinical state Deep tendon reflexes are equal Skin: Warm & dry w/o tenting. No significant lesions or rash.  See summary under each active problem in the Problem List with associated updated therapeutic plan

## 2022-02-07 NOTE — Assessment & Plan Note (Signed)
MRI results & NS follow up pending

## 2022-02-11 ENCOUNTER — Other Ambulatory Visit (HOSPITAL_COMMUNITY)
Admission: RE | Admit: 2022-02-11 | Discharge: 2022-02-11 | Disposition: A | Discharge status: Home / Self Care | Payer: Medicare HMO | Source: Skilled Nursing Facility | Attending: Adult Health | Admitting: Adult Health

## 2022-02-11 DIAGNOSIS — R339 Retention of urine, unspecified: Secondary | ICD-10-CM | POA: Diagnosis not present

## 2022-02-11 LAB — URINALYSIS, ROUTINE W REFLEX MICROSCOPIC: Bacteria, UA: NONE SEEN

## 2022-02-12 LAB — URINE CULTURE

## 2022-02-23 DIAGNOSIS — B351 Tinea unguium: Secondary | ICD-10-CM | POA: Diagnosis not present

## 2022-02-23 DIAGNOSIS — Z794 Long term (current) use of insulin: Secondary | ICD-10-CM | POA: Diagnosis not present

## 2022-02-23 DIAGNOSIS — M79674 Pain in right toe(s): Secondary | ICD-10-CM | POA: Diagnosis not present

## 2022-02-23 DIAGNOSIS — E1140 Type 2 diabetes mellitus with diabetic neuropathy, unspecified: Secondary | ICD-10-CM | POA: Diagnosis not present

## 2022-02-26 NOTE — Assessment & Plan Note (Signed)
Anemia in  the context of CKD stage IIIb is essentially stable with current values of 8.7/27.5.  No bleeding dyscrasias reported by staff.  Continue to monitor.

## 2022-03-02 DIAGNOSIS — M25512 Pain in left shoulder: Secondary | ICD-10-CM | POA: Diagnosis not present

## 2022-03-02 DIAGNOSIS — M25611 Stiffness of right shoulder, not elsewhere classified: Secondary | ICD-10-CM | POA: Diagnosis not present

## 2022-03-02 DIAGNOSIS — M7502 Adhesive capsulitis of left shoulder: Secondary | ICD-10-CM | POA: Diagnosis not present

## 2022-03-02 DIAGNOSIS — M25511 Pain in right shoulder: Secondary | ICD-10-CM | POA: Diagnosis not present

## 2022-03-02 DIAGNOSIS — R279 Unspecified lack of coordination: Secondary | ICD-10-CM | POA: Diagnosis not present

## 2022-03-02 DIAGNOSIS — G8252 Quadriplegia, C1-C4 incomplete: Secondary | ICD-10-CM | POA: Diagnosis not present

## 2022-03-16 ENCOUNTER — Encounter: Payer: Self-pay | Admitting: Adult Health

## 2022-03-16 ENCOUNTER — Non-Acute Institutional Stay (SKILLED_NURSING_FACILITY): Payer: Medicare HMO | Admitting: Adult Health

## 2022-03-16 DIAGNOSIS — M87051 Idiopathic aseptic necrosis of right femur: Secondary | ICD-10-CM

## 2022-03-16 DIAGNOSIS — E1140 Type 2 diabetes mellitus with diabetic neuropathy, unspecified: Secondary | ICD-10-CM

## 2022-03-16 DIAGNOSIS — E1122 Type 2 diabetes mellitus with diabetic chronic kidney disease: Secondary | ICD-10-CM | POA: Diagnosis not present

## 2022-03-16 DIAGNOSIS — E1169 Type 2 diabetes mellitus with other specified complication: Secondary | ICD-10-CM

## 2022-03-16 DIAGNOSIS — I700 Atherosclerosis of aorta: Secondary | ICD-10-CM | POA: Diagnosis not present

## 2022-03-16 DIAGNOSIS — Z794 Long term (current) use of insulin: Secondary | ICD-10-CM

## 2022-03-16 DIAGNOSIS — F339 Major depressive disorder, recurrent, unspecified: Secondary | ICD-10-CM

## 2022-03-16 DIAGNOSIS — E785 Hyperlipidemia, unspecified: Secondary | ICD-10-CM

## 2022-03-16 DIAGNOSIS — K219 Gastro-esophageal reflux disease without esophagitis: Secondary | ICD-10-CM

## 2022-03-16 DIAGNOSIS — E1142 Type 2 diabetes mellitus with diabetic polyneuropathy: Secondary | ICD-10-CM | POA: Diagnosis not present

## 2022-03-16 DIAGNOSIS — N183 Chronic kidney disease, stage 3 unspecified: Secondary | ICD-10-CM

## 2022-03-16 DIAGNOSIS — S14124S Central cord syndrome at C4 level of cervical spinal cord, sequela: Secondary | ICD-10-CM | POA: Diagnosis not present

## 2022-03-16 DIAGNOSIS — K5909 Other constipation: Secondary | ICD-10-CM

## 2022-03-16 DIAGNOSIS — E0869 Diabetes mellitus due to underlying condition with other specified complication: Secondary | ICD-10-CM | POA: Diagnosis not present

## 2022-03-16 DIAGNOSIS — G8252 Quadriplegia, C1-C4 incomplete: Secondary | ICD-10-CM

## 2022-03-16 DIAGNOSIS — D638 Anemia in other chronic diseases classified elsewhere: Secondary | ICD-10-CM

## 2022-03-16 DIAGNOSIS — M87052 Idiopathic aseptic necrosis of left femur: Secondary | ICD-10-CM

## 2022-03-16 DIAGNOSIS — N1832 Chronic kidney disease, stage 3b: Secondary | ICD-10-CM

## 2022-03-16 DIAGNOSIS — R339 Retention of urine, unspecified: Secondary | ICD-10-CM

## 2022-03-16 DIAGNOSIS — R569 Unspecified convulsions: Secondary | ICD-10-CM

## 2022-03-16 DIAGNOSIS — I129 Hypertensive chronic kidney disease with stage 1 through stage 4 chronic kidney disease, or unspecified chronic kidney disease: Secondary | ICD-10-CM

## 2022-03-16 DIAGNOSIS — N1831 Chronic kidney disease, stage 3a: Secondary | ICD-10-CM

## 2022-03-16 NOTE — Progress Notes (Unsigned)
Location:  [facility] Room Number: 154-W Place of Service:  SNF (31)   CODE STATUS: DNR  Allergies  Allergen Reactions   Ace Inhibitors Other (See Comments)    Hyperkalemia    Codeine    Sulfa Antibiotics    Chief Complaint  Patient presents with   Annual Exam     HPI:  She is a long term resident of this facility being seen for her annual exam. Her medical history includes: central cord syndrome; CKD stage 3; diabetes mellitus. She has not required any hospitalizations over the past year. She was taken to the ED at the end of January for seizures. She is being followed by neurology for her seizures. She is getting out of bed on a frequent basis. Her weight has been stable over the past year. Her mood state remains stable. She continues to be followed for her chronic illnesses including: Seizures:Type 2 diabetes mellitus with diabetic neuropathy with long term current use of insulin:  Hyperlipidemia associated with type 2 diabetes mellitus:  Hypertension associated with type 2 diabetes mellitus  Past Medical History:  Diagnosis Date   Adult failure to thrive    Anemia    Anxiety    Atherosclerosis of aorta (HCC)    Central cord syndrome at C4 level of cervical spinal cord, subsequent encounter ([place])    CKD (chronic kidney disease)    stage 3   Depression    DM type 2 with diabetic peripheral neuropathy (HCC)    Dysphagia    GERD (gastroesophageal reflux disease)    Gout    High cholesterol    HTN (hypertension)    Hyponatremia    MDD (major depressive disorder)    Neck pain    Neuropathy    Quadriplegia, C1-C4 incomplete (HCC)    Radiculopathy    RLS (restless legs syndrome)    Urinary retention     Past Surgical History:  Procedure Laterality Date   ABDOMINAL HYSTERECTOMY     ANTERIOR CERVICAL DECOMP/DISCECTOMY FUSION N/A 02/05/2021   Procedure: Cervical Three-Four  Anterior cervical decompression/discectomy/fusion;  Surgeon: Ashok Pall,  MD;  Location: [facility];  Service: Neurosurgery;  Laterality: N/A;  RM 20   APPENDECTOMY     BACK SURGERY     BIOPSY  09/22/2021   Procedure: BIOPSY;  Surgeon: [name], DO;  [facility]: [facility];  Service: Endoscopy;;   CERVICAL DISC SURGERY     ESOPHAGOGASTRODUODENOSCOPY (EGD) WITH PROPOFOL N/A 09/22/2021   Procedure: ESOPHAGOGASTRODUODENOSCOPY (EGD) WITH PROPOFOL;  Surgeon: [name], DO;  Location: [facility];  Service: Endoscopy;  Laterality: N/A;  1:00pm   HAND SURGERY     KNEE SURGERY      Social History   Socioeconomic History   Marital status: Widowed    Spouse name: Not on file   Number of children: Not on file   Years of education: Not on file   Highest education level: Not on file  Occupational History   Not on file  Tobacco Use   Smoking status: Never   Smokeless tobacco: Never  Vaping Use   Vaping Use: Never used  Substance and Sexual Activity   Alcohol use: Never   Drug use: Never   Sexual activity: Never  Other Topics Concern   Not on file  Social History Narrative   Lives at [facility]   Social Determinants of Health   Financial Resource Strain: Not on file  Food Insecurity: Not  on file  Transportation Needs: Not on file  Physical Activity: Not on file  Stress: Not on file  Social Connections: Not on file  Intimate Partner Violence: Not on file   Family History  Problem Relation Age of Onset   Cancer Mother    Cardiomyopathy Father    Seizures Sister        childhood   Seizures Brother        in his 59s   Colon cancer Neg Hx       VITAL SIGNS BP 131/74   Pulse 71   Temp (!) 97.4 F (36.3 C)   Resp 20   Ht 5\' 2"  (1.575 m)   Wt 160 lb 4.8 oz (72.7 kg)   SpO2 96%   BMI 29.32 kg/m   Outpatient Encounter Medications as of 03/16/2022  Medication Sig   acetaminophen (TYLENOL) 325 MG tablet Take 650 mg by mouth every 8 (eight) hours.   albuterol (VENTOLIN HFA) 108 (90 Base) MCG/ACT inhaler Inhale 1 puff into  the lungs every 4 (four) hours as needed for wheezing or shortness of breath.   AMBULATORY NON FORMULARY MEDICATION Medication Name:  Continue monthly catheter changes with 37fr catheter at skilled nursing facility.   Artificial Saliva (BIOTENE MOISTURIZING MOUTH MT) Use as directed 1 application. in the mouth or throat at bedtime. 9 pm   aspirin EC 81 MG tablet Take 81 mg by mouth daily. Swallow whole.9 am   Balsam [place]-Castor Oil (VENELEX) OINT Apply topically. Apply to sacrum, coccyx, bilateral buttocks qshift for prevention.   BENZONATATE PO Take 100 mg by mouth every 8 (eight) hours as needed (cough).   chlorthalidone (HYGROTON) 25 MG tablet Take 25 mg by mouth daily.   colchicine 0.6 MG tablet Take 0.6 mg by mouth daily as needed.   DULoxetine (CYMBALTA) 60 MG capsule Take 60 mg by mouth daily.   fexofenadine (ALLEGRA) 180 MG tablet Take 180 mg by mouth daily.   gabapentin (NEURONTIN) 100 MG capsule Take 1 capsule (100 mg total) by mouth 2 (two) times daily.   guaiFENesin (MUCINEX) 600 MG 12 hr tablet Take by mouth 2 (two) times daily.   insulin lispro (HUMALOG) 100 UNIT/ML KwikPen Inject 5 Units into the skin 2 (two) times daily with a meal.   Insulin Pen Needle 30G X 5 MM MISC 1 Device by Does not apply route daily. 3/16"   lamoTRIgine (LAMICTAL) 25 MG tablet Take 25 mg by mouth daily. DX: Unspecified convulsions   LANTUS SOLOSTAR 100 UNIT/ML Solostar Pen Inject 15 Units into the skin at bedtime.   levETIRAcetam (KEPPRA) 1000 MG tablet Take 1,000 mg by mouth daily. 9:00 am and 9:00 pm   levETIRAcetam (KEPPRA) 500 MG tablet Take 500 mg by mouth at bedtime. Along with 1000 mg for a total on 1500 mg in the pm (see other listing)   lidocaine (LIDODERM) 5 % Place 1 patch onto the skin in the morning. Remove & Discard patch within 12 hours or as directed by MD   Lidocaine-Glycerin (PREPARATION H EX) Apply topically. Maximum Strength (phenyleph-pramoxin-glycr-w.pet) [OTC] cream; 0.25-1 %;  rectal Special Instructions: As needed for rectal itching/hemorrhoids. Once A Day   LORazepam (ATIVAN) 2 MG/ML injection Inject 0.5 mLs (1 mg total) into the vein every 15 (fifteen) minutes as needed.   melatonin 3 MG TABS tablet Take 3 mg by mouth at bedtime.   metoCLOPramide (REGLAN) 5 MG tablet Take 5 mg by mouth 4 (four) times daily.   NON  FORMULARY Diet - NAS, Cons CHO   omeprazole (PRILOSEC) 40 MG capsule Take 40 mg by mouth daily.   rOPINIRole (REQUIP) 1 MG tablet Take 1 mg by mouth at bedtime.   rosuvastatin (CRESTOR) 20 MG tablet Take 20 mg by mouth daily.   tiZANidine (ZANAFLEX) 2 MG tablet Take 2 mg by mouth every 6 (six) hours as needed for muscle spasms (for back and sciatic pain).   [DISCONTINUED] atorvastatin (LIPITOR) 20 MG tablet Take 1 tablet (20 mg total) by mouth every evening.   [DISCONTINUED] hydrocortisone 1 % ointment Apply 1 application. topically 2 (two) times daily. Apply to itchy rash on abd and (R) breast twice daily until resolved   No facility-administered encounter medications on file as of 03/16/2022.     SIGNIFICANT DIAGNOSTIC EXAMS  PREVIOUS    03-06-21 chest x-ray: left lower lobe infiltrate likely representing pneumonia   03-26-21: lumbar x-ray: mild to moderate degenerative changes in lumbar spine  03-27-21; DEXA scan: t score -1.777  06-22-21: ct of abdomen and pelvis:  Left colonic diverticulosis.  No active diverticulitis. Bilateral hydronephrosis to the level of the bladder. No obstructing stones. Urinary bladder is distended with Foley catheter in place. Aortic atherosclerosis.  07-06-21: ct head:  No intracranial abnormality or other acute findings. Chronic bilateral maxillary sinus disease.  07-06-21: ct of abdomen and pelvis:  1. Heterogeneous enhancement of the left kidney with mild left perinephric edema, suspicious for pyelonephritis. There is also mild bladder wall thickening and perivesicular fat stranding with Foley catheter in  place. Improvement in bilateral hydronephrosis from prior exam. 2. Stool distends the rectum at 7.6 cm with mild distal rectal wall thickening, suspicious for fecal impaction. 3. Colonic diverticulosis without diverticulitis. Aortic Atherosclerosis   07-07-21: MRI of head:  No acute intracranial finding. No sign of acute infarction. No lesion seen to explain seizure. The patient does have mild chronic small-vessel ischemic changes considering age affecting the pons, thalami and cerebral hemispheric white matter. Left mastoid effusion  09-14-21: ct of head:  No acute intracranial abnormality. 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 New left sphenoid sinus air-fluid level, which may be seen with acute sinusitis.  09-28-21: ct of head No acute intracranial findings are seen in noncontrast CT brain. Chronic sinusitis.  Bilateral mastoid effusions.  NO NEW EXAMS       LABS REVIEWED PREVIOUS   03-06-21: wbc 5.3; hgb 8.8; hct 27.1; mcv 93.4 plt 262; glucose 453; bun 66; creat 1.30; k+ 4.6; na++ 135; ca 9.0; GFR 41; d-dimer: 2.16; CRP 3.0  03-13-21: d-dimer: 1.49 03-20-21: hep C nr; d-dimer 0.86 03-27-21: d-dimer 0.52 03-31-21: wbc 5.5; hgb 8.5; hct 26.8; mcv 93.4 plt 203; hgb a1c 7.6; chol 130; ldl 68; trig 138; hdl 34; urine micro-albumin 25.1 06-12-21: uric acid 5.9 06-15-21: wbc 6.0; hgb 9.8; hct 29.4; mcv 90,7 plt 242; glucose 187; bun 65; creat 1.33; k+ 4.2; na++ 131; ca 8.9; GFR 40; liver normal albumin 3.3  06-19-21: wbc 7.3; hgb 9.9; hct 30.1; mcv 89,9 plt 306; glucose 115; bun 29; creat 1.05; k+ 4.1; na++ 131; ca 8.7; GFR 53; liver normal albumin 3.1; urine micro-albumin 85.3 06-22-21; wbc 11.8; hgb 10.4; hct 30.9; mcv 89,3 plt 375; glucose 288; bun 44; creat 1.46; k+ 3.5; na++ 125; ca 8.3 GFR 36; liver normal albumin 3.3; lipase 37  07-06-21: wbc 7.6; hgb 9.7; hct 29.8; mcv 89,2 plt 409; glucose 92; bun 44; creat 1.09; k+ 5,3l na++ 136; ca 9.4; GFR 51; liver normal albumin 3.4  Hgb A1C: 8.3;  urine culture: proteus mirabilis; + 30,000 colonies klebsiella pneumoniae  08-29-21: wbc 5.6; hgb 9.0; hct 27.6; mcv 94.2 plt 244; glucose 106; bun 80; creat 1.35; k+ 5.8; na++ 134; ca 9.0; GFR 39 liver normal albumin 3.5 vit B 12: 446; folate 7.7; iron 79 tibc 280; ferritin 178 09-04-21: glucose 66; bun 79; creat 1.44; k+ 5.5; na++ 135; ca 9.2; GFR 36 09-14-21: wbc 6.2; hgb 8.9; hct 27.1; mcv 96.1 plt 217; glucose 171; bun 58; creat 1.69; k+ 5.8; na++ 134; ca 8.7; GFR 30; urine culture: multiple species 09-15-20: k+ 6.3 09-16-21: k+ 5.0  09-28-21: wbc 6.0; hgb 9.2; hct 27.9; mcv 95.2 plt 252; glucose 160; bun 57; creat 1.33; k+ 5.1; na++ 133; ca 8.9; GFR 40; mag 1.8; liver normal albumin 3.8  10-17-21: wbc 5.9; hgb 8.9; hct 26.6; mcv 95.3 plt 223; glucose 171; bun 44; creat 1.31;k+ 4.8; na++ 134; ca 8.6; gfr 40. D-dimer 1.21; crp 3.2  10-27-21: hgb a1c 6.5 11-04-21: CRp 21.2; d-dimer 1.46 11-24-21: wbc 5.4; hgb 8.7; hct 27.5; mcv 94.2 plt 218; sed rate 80; ana: neg; crp 0.6  TODAY  01-15-22: hgb a1c 6.7 chol 148; ldl 87; trig 112; hdl 39; tsh 5.663  02-11-22: urine culture: multiple bacteria    Review of Systems  Constitutional:  Negative for malaise/fatigue.  HENT:  Negative for congestion.   Eyes:  Negative for pain.  Respiratory:  Negative for cough and shortness of breath.   Cardiovascular:  Negative for chest pain, palpitations and leg swelling.  Gastrointestinal:  Negative for abdominal pain, constipation and heartburn.  Musculoskeletal:  Negative for back pain, joint pain and myalgias.  Skin: Negative.   Neurological:  Negative for dizziness.  Psychiatric/Behavioral:  The patient is not nervous/anxious.    Physical Exam Constitutional:      General: She is not in acute distress.    Appearance: She is well-developed. She is not diaphoretic.  HENT:     Nose: Nose normal.     Mouth/Throat:     Mouth: Mucous membranes are moist.     Pharynx: Oropharynx is clear.  Eyes:      Conjunctiva/sclera: Conjunctivae normal.  Neck:     Thyroid: No thyromegaly.  Cardiovascular:     Rate and Rhythm: Normal rate and regular rhythm.     Pulses: Normal pulses.     Heart sounds: Normal heart sounds.  Pulmonary:     Effort: Pulmonary effort is normal. No respiratory distress.     Breath sounds: Normal breath sounds.  Abdominal:     General: Bowel sounds are normal. There is no distension.     Palpations: Abdomen is soft.     Tenderness: There is no abdominal tenderness.  Genitourinary:    Comments: foley Musculoskeletal:     Cervical back: Neck supple.     Right lower leg: No edema.     Left lower leg: No edema.     Comments:   Does not move legs; able to move upper extremities   left upper extremity is weak     Lymphadenopathy:     Cervical: No cervical adenopathy.  Skin:    General: Skin is warm and dry.  Neurological:     Mental Status: She is alert and oriented to person, place, and time.  Psychiatric:        Mood and Affect: Mood normal.       ASSESSMENT/ PLAN:  TODAY   1. Seizures: is stable: will continue keppra  1000  mg twice daily and 1500 mg nightly  lamictal 25 mg daily; will continue prn ativan is followed by neurology   2. Type 2 diabetes mellitus with diabetic neuropathy with long term current use of insulin: hgb a1c 8.3; will continue lantus 15 units nightly and humalog 5 units twice daily with meals.   3. Hyperlipidemia associated with type 2 diabetes mellitus: stable LDL 87 will continue crestor 20 mg daily   4. Hypertension associated with type 2 diabetes mellitus: b/p 131/74 will continue hy[place] 25 mg daily is off lisinopril due to renal failure  5. GERD without esophagitis: is stable will continue prilosec 40 mg daily reglan 5 mg four times daily   6. Chronic constipation; is presently off medications will monitor   7. 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   8. Chronic cough: will continue mucinex 600 mg twice daily   9. Diabetic peripheral neuropathy: will continue gabapentin 100 mg twice daily   10. CKD stage 3b due to type 2 diabetes mellitus: bun 44; creat 1.31; gfr 44  will monitor   11. Normocytic anemia: hgb 8.7 will monitor   12. Urine retention: has foley   13. Chronic anxiety: is stable will monitor  14. Protein calorie malnutrition severe: is stable albumin 3.8 will continue supplements as directed  15. Aortic atherosclerosis (ct 01-26-21) is on asa and statin  16. Central cord syndrome at C4 level of cervical spine compression subsequent encounter/ cord compression quadriplegia C1-4 incomplete; is on asa 81 mg daily   17. Restless leg syndrome: will continue requip 1 mg nightly     [name] NP [facility]   call [phone]

## 2022-03-19 ENCOUNTER — Encounter: Payer: Self-pay | Admitting: Adult Health

## 2022-03-19 ENCOUNTER — Non-Acute Institutional Stay (SKILLED_NURSING_FACILITY): Payer: Medicare HMO | Admitting: Adult Health

## 2022-03-19 DIAGNOSIS — Z0000 Encounter for general adult medical examination without abnormal findings: Secondary | ICD-10-CM

## 2022-03-19 NOTE — Progress Notes (Signed)
Subjective:   [name] is a 86 y.o. female who presents for Medicare Annual (Subsequent) preventive examination.  Review of Systems    Review of Systems  Constitutional:  Negative for malaise/fatigue.  Respiratory:  Negative for cough and shortness of breath.   Cardiovascular:  Negative for chest pain, palpitations and leg swelling.  Gastrointestinal:  Negative for abdominal pain, constipation and heartburn.  Musculoskeletal:  Negative for back pain, joint pain and myalgias.  Skin: Negative.   Neurological:  Negative for dizziness.  Psychiatric/Behavioral:  The patient is not nervous/anxious.     Cardiac Risk Factors include: advanced age (>71men, >57 women);diabetes mellitus;hypertension;sedentary lifestyle     Objective:    Today's Vitals   03/19/22 1038  BP: 131/74  Pulse: 71  Resp: 20  Temp: (!) 97.4 F (36.3 C)  SpO2: 96%  Weight: 160 lb 4.8 oz (72.7 kg)  Height: 5\' 2"  (1.575 m)   Body mass index is 29.32 kg/m.     03/19/2022   10:46 AM 03/16/2022    9:55 AM 01/12/2022   10:56 AM 12/19/2021   10:01 AM 12/15/2021    9:20 AM 11/03/2021    3:41 PM 10/27/2021    3:15 PM  Advanced Directives  Does Patient Have a Medical Advance Directive? Yes Yes Yes Yes Yes Yes Yes  Type of Advance Directive Out of facility DNR (pink MOST or yellow form) Out of facility DNR (pink MOST or yellow form) Out of facility DNR (pink MOST or yellow form) Out of facility DNR (pink MOST or yellow form) Out of facility DNR (pink MOST or yellow form) Out of facility DNR (pink MOST or yellow form) Out of facility DNR (pink MOST or yellow form)  Does patient want to make changes to medical advance directive? No - Patient declined No - Patient declined No - Patient declined No - Patient declined No - Patient declined No - Patient declined No - Patient declined  Pre-existing out of facility DNR order (yellow form or pink MOST form) Yellow form placed in chart (order not valid for inpatient use)   Yellow form placed in chart (order not valid for inpatient use) Yellow form placed in chart (order not valid for inpatient use) Yellow form placed in chart (order not valid for inpatient use) Yellow form placed in chart (order not valid for inpatient use) Yellow form placed in chart (order not valid for inpatient use)    Current Medications (verified) Outpatient Encounter Medications as of 03/19/2022  Medication Sig   acetaminophen (TYLENOL) 325 MG tablet Take 650 mg by mouth every 8 (eight) hours.   albuterol (VENTOLIN HFA) 108 (90 Base) MCG/ACT inhaler Inhale 1 puff into the lungs every 4 (four) hours as needed for wheezing or shortness of breath.   AMBULATORY NON FORMULARY MEDICATION Medication Name:  Continue monthly catheter changes with 65fr catheter at skilled nursing facility.   Artificial Saliva (BIOTENE MOISTURIZING MOUTH MT) Use as directed 1 application. in the mouth or throat at bedtime. 9 pm   aspirin EC 81 MG tablet Take 81 mg by mouth daily. Swallow whole.9 am   Balsam [place]-Castor Oil (VENELEX) OINT Apply topically. Apply to sacrum, coccyx, bilateral buttocks qshift for prevention.   BENZONATATE PO Take 100 mg by mouth every 8 (eight) hours as needed (cough).   chlorthalidone (HYGROTON) 25 MG tablet Take 25 mg by mouth daily.   colchicine 0.6 MG tablet Take 0.6 mg by mouth daily as needed.   DULoxetine (CYMBALTA) 60  MG capsule Take 60 mg by mouth daily.   fexofenadine (ALLEGRA) 180 MG tablet Take 180 mg by mouth daily.   gabapentin (NEURONTIN) 100 MG capsule Take 1 capsule (100 mg total) by mouth 2 (two) times daily.   guaiFENesin (MUCINEX) 600 MG 12 hr tablet Take by mouth 2 (two) times daily.   insulin lispro (HUMALOG) 100 UNIT/ML KwikPen Inject 5 Units into the skin 2 (two) times daily with a meal.   Insulin Pen Needle 30G X 5 MM MISC 1 Device by Does not apply route daily. 3/16"   lamoTRIgine (LAMICTAL) 25 MG tablet Take 25 mg by mouth daily. DX: Unspecified convulsions    LANTUS SOLOSTAR 100 UNIT/ML Solostar Pen Inject 15 Units into the skin at bedtime.   levETIRAcetam (KEPPRA) 1000 MG tablet Take 1,000 mg by mouth daily. 9:00 am and 9:00 pm   levETIRAcetam (KEPPRA) 500 MG tablet Take 500 mg by mouth at bedtime. Along with 1000 mg for a total on 1500 mg in the pm (see other listing)   lidocaine (LIDODERM) 5 % Place 1 patch onto the skin in the morning. Remove & Discard patch within 12 hours or as directed by MD   Lidocaine-Glycerin (PREPARATION H EX) Apply topically. Maximum Strength (phenyleph-pramoxin-glycr-w.pet) [OTC] cream; 0.25-1 %; rectal Special Instructions: As needed for rectal itching/hemorrhoids. Once A Day   LORazepam (ATIVAN) 2 MG/ML injection Inject 0.5 mLs (1 mg total) into the vein every 15 (fifteen) minutes as needed.   melatonin 3 MG TABS tablet Take 3 mg by mouth at bedtime.   metoCLOPramide (REGLAN) 5 MG tablet Take 5 mg by mouth 4 (four) times daily.   NON FORMULARY Diet - NAS, Cons CHO   omeprazole (PRILOSEC) 40 MG capsule Take 40 mg by mouth daily.   rOPINIRole (REQUIP) 1 MG tablet Take 1 mg by mouth at bedtime.   rosuvastatin (CRESTOR) 20 MG tablet Take 20 mg by mouth daily.   tiZANidine (ZANAFLEX) 2 MG tablet Take 2 mg by mouth every 6 (six) hours as needed for muscle spasms (for back and sciatic pain).   No facility-administered encounter medications on file as of 03/19/2022.    Allergies (verified) Ace inhibitors, Codeine, and Sulfa antibiotics   History: Past Medical History:  Diagnosis Date   Adult failure to thrive    Anemia    Anxiety    Atherosclerosis of aorta (HCC)    Central cord syndrome at C4 level of cervical spinal cord, subsequent encounter ([place])    CKD (chronic kidney disease)    stage 3   Depression    DM type 2 with diabetic peripheral neuropathy (HCC)    Dysphagia    GERD (gastroesophageal reflux disease)    Gout    High cholesterol    HTN (hypertension)    Hyponatremia    MDD (major depressive  disorder)    Neck pain    Neuropathy    Quadriplegia, C1-C4 incomplete (HCC)    Radiculopathy    RLS (restless legs syndrome)    Urinary retention    Past Surgical History:  Procedure Laterality Date   ABDOMINAL HYSTERECTOMY     ANTERIOR CERVICAL DECOMP/DISCECTOMY FUSION N/A 02/05/2021   Procedure: Cervical Three-Four  Anterior cervical decompression/discectomy/fusion;  Surgeon: Ashok Pall, MD;  Location: [facility];  Service: Neurosurgery;  Laterality: N/A;  RM 20   APPENDECTOMY     BACK SURGERY     BIOPSY  09/22/2021   Procedure: BIOPSY;  Surgeon: [name], DO;  Location: [facility]  ENDO [facility];  Service: Endoscopy;;   CERVICAL DISC SURGERY     ESOPHAGOGASTRODUODENOSCOPY (EGD) WITH PROPOFOL N/A 09/22/2021   Procedure: ESOPHAGOGASTRODUODENOSCOPY (EGD) WITH PROPOFOL;  Surgeon: [name], DO;  Location: [facility];  Service: Endoscopy;  Laterality: N/A;  1:00pm   HAND SURGERY     KNEE SURGERY     Family History  Problem Relation Age of Onset   Cancer Mother    Cardiomyopathy Father    Seizures Sister        childhood   Seizures Brother        in his 70s   Colon cancer Neg Hx    Social History   Socioeconomic History   Marital status: Widowed    Spouse name: Not on file   Number of children: Not on file   Years of education: Not on file   Highest education level: Not on file  Occupational History   Not on file  Tobacco Use   Smoking status: Never   Smokeless tobacco: Never  Vaping Use   Vaping Use: Never used  Substance and Sexual Activity   Alcohol use: Never   Drug use: Never   Sexual activity: Never  Other Topics Concern   Not on file  Social History Narrative   Lives at [facility]   Social Determinants of Health   Financial Resource Strain: Not on file  Food Insecurity: Not on file  Transportation Needs: Not on file  Physical Activity: Not on file  Stress: Not on file  Social Connections: Not on file    Tobacco Counseling Counseling  given: Not Answered   Clinical Intake:  Pre-visit preparation completed: Yes  Pain : No/denies pain     BMI - recorded: 29.32 Nutritional Status: BMI 25 -29 Overweight Nutritional Risks: Unintentional weight gain, Unintentional weight loss Diabetes: Yes CBG done?: Yes CBG resulted in Enter/ Edit results?: No Did pt. bring in CBG monitor from home?: No  How often do you need to have someone help you when you read instructions, pamphlets, or other written materials from your doctor or pharmacy?: 5 - Always  Diabetic?yes   Interpreter Needed?: No      Activities of Daily Living    03/20/2022    2:02 PM  In your present state of health, do you have any difficulty performing the following activities:  Hearing? 0  Vision? 0  Difficulty concentrating or making decisions? 0  Walking or climbing stairs? 1  Dressing or bathing? 1  Doing errands, shopping? 1  Preparing Food and eating ? Y  Using the Toilet? Y  In the past six months, have you accidently leaked urine? Y  Do you have problems with loss of bowel control? Y  Managing your Medications? Y  Managing your Finances? Y    Patient Care Team: Gerlene Fee, NP as PCP - General (Geriatric Medicine) Center, [place] ([place]) [name], DO as Consulting Physician (Gastroenterology)  Indicate any recent Medical Services you may have received from other than [facility] providers in the past year (date may be approximate).     Assessment:   This is a routine wellness examination for [place].  Hearing/Vision screen No results found.  Dietary issues and exercise activities discussed: Current Exercise Habits: The patient does not participate in regular exercise at present, Exercise limited by: None identified   Goals Addressed             This Visit's Progress  Absence of Fall and Fall-Related Injury   On track    Evidence-based guidance:  Assess fall risk using a validated tool when  available. Consider balance and gait impairment, muscle weakness, diminished vision or hearing, environmental hazards, presence of urinary or bowel urgency and/or incontinence.  Communicate fall injury risk to interprofessional healthcare team.  Develop a fall prevention plan with the patient and family.  Promote use of personal vision and auditory aids.  Promote reorientation, appropriate sensory stimulation, and routines to decrease risk of fall when changes in mental status are present.  Assess assistance level required for safe and effective self-care; consider referral for home care.  Encourage physical activity, such as performance of self-care at highest level of ability, strength and balance exercise program, and provision of appropriate assistive devices; refer to rehabilitation therapy.  Refer to community-based fall prevention program where available.  If fall occurs, determine the cause and revise fall injury prevention plan.  Regularly review medication contribution to fall risk; consider risk related to polypharmacy and age.  Refer to pharmacist for consultation when concerns about medications are revealed.  Balance adequate pain management with potential for oversedation.  Provide guidance related to environmental modifications.  Consider supplementation with Vitamin D.   Notes:      DIET - INCREASE WATER INTAKE   On track    General - Client will not be readmitted within 30 days ([facility])   On track      Depression Screen    01/14/2022   12:32 PM 09/19/2021    1:33 PM 09/17/2021   11:19 AM 03/18/2021   12:54 PM 06/06/2018    2:57 PM 05/04/2018   10:40 AM 03/09/2018   11:07 AM  PHQ 2/9 Scores  PHQ - 2 Score 0 1 0 2 0 3 0  PHQ- 9 Score 1 3  4  11      Fall Risk    03/20/2022    2:02 PM 01/14/2022   12:33 PM 10/01/2021    1:08 PM 09/25/2021    3:03 PM 09/19/2021    1:33 PM  [place] in the past year? 0 0 0 0 0  Number falls in past yr: 0 0 0 0 0  Injury with Fall? 0  0 0 0 0  Risk for fall due to : Impaired balance/gait;Impaired mobility Impaired balance/gait;Impaired mobility Impaired balance/gait;Impaired mobility History of fall(s);Impaired balance/gait;Impaired mobility     FALL RISK PREVENTION PERTAINING TO THE HOME:  Any stairs in or around the home? Yes  If so, are there any without handrails? No  Home free of loose throw rugs in walkways, pet beds, electrical cords, etc? Yes  Adequate lighting in your home to reduce risk of falls? Yes   ASSISTIVE DEVICES UTILIZED TO PREVENT FALLS:  Life alert? No  Use of a cane, walker or w/c? Yes  Grab bars in the bathroom? Yes  Shower chair or bench in shower? Yes  Elevated toilet seat or a handicapped toilet? Yes   TIMED UP AND GO:  Was the test performed? No .  Length of time to ambulate 10 feet: nonambulatory   Cognitive Function:    03/20/2022    2:03 PM 03/18/2021   12:59 PM  MMSE - Mini Mental State Exam  Orientation to time 5 5  Orientation to Place 5 5  Registration 3 3  Attention/ Calculation 5 4  Recall 3 3  Language- name 2 objects 2 2  Language- repeat 1 1  Language- follow 3 step command 1 3  Language- read & follow direction 1 1  Write a sentence 0 0  Copy design 0 0  Total score 26 27        03/20/2022    2:06 PM 03/18/2021    1:00 PM  6CIT Screen  What Year? 0 points 0 points  What month? 0 points 0 points  What time? 0 points 0 points  Count back from 20 2 points 2 points  Months in reverse 2 points 2 points  Repeat phrase 0 points 2 points  Total Score 4 points 6 points    Immunizations Immunization History  Administered Date(s) Administered   Fluad Quad(high Dose 65+) 06/04/2021   Influenza,inj,quad, With Preservative 06/14/2018   Influenza-Unspecified 06/05/2020   Moderna Covid-19 Vaccine Bivalent Booster 23yrs & up 06/24/2021   Moderna SARS-COV2 Booster Vaccination 04/16/2021   Moderna Sars-Covid-2 Vaccination 10/26/2019, 11/24/2019, 07/24/2020    Pneumococcal Conjugate-13 06/28/2017   Pneumococcal Polysaccharide-23 09/30/2020   Tdap 01/25/2021   Zoster Recombinat (Shingrix) 03/12/2021, 06/04/2021    TDAP status: Up to date  Flu Vaccine status: Up to date  Pneumococcal vaccine status: Up to date  Covid-19 vaccine status: Completed vaccines  Qualifies for Shingles Vaccine? Yes   Zostavax completed Yes   Shingrix Completed?: Yes  Screening Tests Health Maintenance  Topic Date Due   OPHTHALMOLOGY EXAM  Never done   INFLUENZA VACCINE  03/31/2022   URINE MICROALBUMIN  06/19/2022   HEMOGLOBIN A1C  07/18/2022   FOOT EXAM  08/04/2022   TETANUS/TDAP  01/26/2031   Pneumonia Vaccine 28+ Years old  Completed   DEXA SCAN  Completed   COVID-19 Vaccine  Completed   Zoster Vaccines- Shingrix  Completed   HPV VACCINES  Aged Out    Health Maintenance  Health Maintenance Due  Topic Date Due   OPHTHALMOLOGY EXAM  Never done    Colorectal cancer screening: No longer required.   Mammogram status: No longer required due to age.    Lung Cancer Screening: (Low Dose CT Chest recommended if Age 39-80 years, 30 pack-year currently smoking OR have quit w/in 15years.) does not qualify.   Lung Cancer Screening Referral: n/a  Additional Screening:  Hepatitis C Screening: does not qualify; Completed   Vision Screening: Recommended annual ophthalmology exams for early detection of glaucoma and other disorders of the eye. Is the patient up to date with their annual eye exam?  Yes  Who is the provider or what is the name of the office in which the patient attends annual eye exams?  If pt is not established with a provider, would they like to be referred to a provider to establish care? No .   Dental Screening: Recommended annual dental exams for proper oral hygiene  Community Resource Referral / Chronic Care Management: CRR required this visit?  No   CCM required this visit?  No      Plan:     I have personally reviewed and  noted the following in the patient's chart:   Medical and social history Use of alcohol, tobacco or illicit drugs  Current medications and supplements including opioid prescriptions.  Functional ability and status Nutritional status Physical activity Advanced directives List of other physicians Hospitalizations, surgeries, and ER visits in previous 12 months Vitals Screenings to include cognitive, depression, and falls Referrals and appointments  In addition, I have reviewed and discussed with patient certain preventive protocols, quality metrics, and best practice recommendations. A written personalized care  plan for preventive services as well as general preventive health recommendations were provided to patient.     Gerlene Fee, NP   03/20/2022   Nurse Notes: exam performed by me at this facility

## 2022-03-20 NOTE — Patient Instructions (Signed)
  [name] , Thank you for taking time to come for your Medicare Wellness Visit. I appreciate your ongoing commitment to your health goals. Please review the following plan we discussed and let me know if I can assist you in the future.   These are the goals we discussed:  Goals      Absence of Fall and Fall-Related Injury     Evidence-based guidance:  Assess fall risk using a validated tool when available. Consider balance and gait impairment, muscle weakness, diminished vision or hearing, environmental hazards, presence of urinary or bowel urgency and/or incontinence.  Communicate fall injury risk to interprofessional healthcare team.  Develop a fall prevention plan with the patient and family.  Promote use of personal vision and auditory aids.  Promote reorientation, appropriate sensory stimulation, and routines to decrease risk of fall when changes in mental status are present.  Assess assistance level required for safe and effective self-care; consider referral for home care.  Encourage physical activity, such as performance of self-care at highest level of ability, strength and balance exercise program, and provision of appropriate assistive devices; refer to rehabilitation therapy.  Refer to community-based fall prevention program where available.  If fall occurs, determine the cause and revise fall injury prevention plan.  Regularly review medication contribution to fall risk; consider risk related to polypharmacy and age.  Refer to pharmacist for consultation when concerns about medications are revealed.  Balance adequate pain management with potential for oversedation.  Provide guidance related to environmental modifications.  Consider supplementation with Vitamin D.   Notes:      DIET - INCREASE WATER INTAKE     General - Client will not be readmitted within 30 days ([facility])        This is a list of the screening recommended for you and due dates:  Health Maintenance   Topic Date Due   Eye exam for diabetics  Never done   Flu Shot  03/31/2022   Urine Protein Check  06/19/2022   Hemoglobin A1C  07/18/2022   Complete foot exam   08/04/2022   Tetanus Vaccine  01/26/2031   Pneumonia Vaccine  Completed   DEXA scan (bone density measurement)  Completed   COVID-19 Vaccine  Completed   Zoster (Shingles) Vaccine  Completed   HPV Vaccine  Aged Out

## 2022-03-26 DIAGNOSIS — M4802 Spinal stenosis, cervical region: Secondary | ICD-10-CM | POA: Diagnosis not present

## 2022-03-27 ENCOUNTER — Encounter: Payer: Self-pay | Admitting: Adult Health

## 2022-03-27 ENCOUNTER — Non-Acute Institutional Stay (SKILLED_NURSING_FACILITY): Payer: Medicare HMO | Admitting: Adult Health

## 2022-03-27 DIAGNOSIS — N183 Chronic kidney disease, stage 3 unspecified: Secondary | ICD-10-CM | POA: Diagnosis not present

## 2022-03-27 DIAGNOSIS — I700 Atherosclerosis of aorta: Secondary | ICD-10-CM

## 2022-03-27 DIAGNOSIS — E1142 Type 2 diabetes mellitus with diabetic polyneuropathy: Secondary | ICD-10-CM | POA: Diagnosis not present

## 2022-03-27 DIAGNOSIS — I129 Hypertensive chronic kidney disease with stage 1 through stage 4 chronic kidney disease, or unspecified chronic kidney disease: Secondary | ICD-10-CM | POA: Diagnosis not present

## 2022-03-27 DIAGNOSIS — N1831 Chronic kidney disease, stage 3a: Secondary | ICD-10-CM | POA: Diagnosis not present

## 2022-03-27 DIAGNOSIS — E1122 Type 2 diabetes mellitus with diabetic chronic kidney disease: Secondary | ICD-10-CM | POA: Diagnosis not present

## 2022-03-27 NOTE — Progress Notes (Signed)
Location:  [facility] Room Number: 154 Place of Service:  SNF (31)   CODE STATUS: dnr  Allergies  Allergen Reactions   Ace Inhibitors Other (See Comments)    Hyperkalemia    Codeine    Sulfa Antibiotics     Chief Complaint  Patient presents with   Acute Visit    Care plan meeting     HPI:  We have come together for her care plan meeting. Family present. BIMS 13/15 mood 6/30: decreased energy; nervous at times. She is nonambulatory with no falls. She reqiures extensive to dependent for her adl care. Has foley is incontinent of bowel. Dietary 160 pounds has built up utensils NAS CON CHO 1-100% appetite; feeds self. Therapy: was stopped today for self feeding and range of motion upper body strength improved and decreased upper arm pain. Activities: one on one therapy.  She continues to be followed for her chronic illnesses including: Aortic atherosclerosis  Hypertension associated with stage 3 chronic kidney disease due to type 2 diabetes mellitus  CKD stage 3 due to type 2 diabetes mellitus   Diabetic peripheral neuropathy   Past Medical History:  Diagnosis Date   Adult failure to thrive    Anemia    Anxiety    Atherosclerosis of aorta (HCC)    Central cord syndrome at C4 level of cervical spinal cord, subsequent encounter ([place])    CKD (chronic kidney disease)    stage 3   Depression    DM type 2 with diabetic peripheral neuropathy (HCC)    Dysphagia    GERD (gastroesophageal reflux disease)    Gout    High cholesterol    HTN (hypertension)    Hyponatremia    MDD (major depressive disorder)    Neck pain    Neuropathy    Quadriplegia, C1-C4 incomplete (HCC)    Radiculopathy    RLS (restless legs syndrome)    Urinary retention     Past Surgical History:  Procedure Laterality Date   ABDOMINAL HYSTERECTOMY     ANTERIOR CERVICAL DECOMP/DISCECTOMY FUSION N/A 02/05/2021   Procedure: Cervical Three-Four  Anterior cervical  decompression/discectomy/fusion;  Surgeon: Ashok Pall, MD;  Location: [facility];  Service: Neurosurgery;  Laterality: N/A;  RM 20   APPENDECTOMY     BACK SURGERY     BIOPSY  09/22/2021   Procedure: BIOPSY;  Surgeon: [name], DO;  Location: [facility];  Service: Endoscopy;;   CERVICAL DISC SURGERY     ESOPHAGOGASTRODUODENOSCOPY (EGD) WITH PROPOFOL N/A 09/22/2021   Procedure: ESOPHAGOGASTRODUODENOSCOPY (EGD) WITH PROPOFOL;  Surgeon: [name], DO;  Location: [facility];  Service: Endoscopy;  Laterality: N/A;  1:00pm   HAND SURGERY     KNEE SURGERY      Social History   Socioeconomic History   Marital status: Widowed    Spouse name: Not on file   Number of children: Not on file   Years of education: Not on file   Highest education level: Not on file  Occupational History   Not on file  Tobacco Use   Smoking status: Never   Smokeless tobacco: Never  Vaping Use   Vaping Use: Never used  Substance and Sexual Activity   Alcohol use: Never   Drug use: Never   Sexual activity: Never  Other Topics Concern   Not on file  Social History Narrative   Lives at [facility]  Resource Strain: Not on file  Food Insecurity: Not on file  Transportation Needs: Not on file  Physical Activity: Not on file  Stress: Not on file  Social Connections: Not on file  Intimate Partner Violence: Not on file   Family History  Problem Relation Age of Onset   Cancer Mother    Cardiomyopathy Father    Seizures Sister        childhood   Seizures Brother        in his 79s   Colon cancer Neg Hx       VITAL SIGNS BP 140/71   Pulse 77   Temp 98.6 F (37 C)   Resp 18   Ht 5\' 2"  (1.575 m)   Wt 160 lb 4.8 oz (72.7 kg)   SpO2 96%   BMI 29.32 kg/m   Outpatient Encounter Medications as of 03/27/2022  Medication Sig   acetaminophen (TYLENOL) 325 MG tablet Take 650 mg by mouth every 8 (eight) hours.   albuterol (VENTOLIN  HFA) 108 (90 Base) MCG/ACT inhaler Inhale 1 puff into the lungs every 4 (four) hours as needed for wheezing or shortness of breath.   AMBULATORY NON FORMULARY MEDICATION Medication Name:  Continue monthly catheter changes with 84fr catheter at skilled nursing facility.   Artificial Saliva (BIOTENE MOISTURIZING MOUTH MT) Use as directed 1 application. in the mouth or throat at bedtime. 9 pm   aspirin EC 81 MG tablet Take 81 mg by mouth daily. Swallow whole.9 am   Balsam [place]-Castor Oil (VENELEX) OINT Apply topically. Apply to sacrum, coccyx, bilateral buttocks qshift for prevention.   BENZONATATE PO Take 100 mg by mouth every 8 (eight) hours as needed (cough).   chlorthalidone (HYGROTON) 25 MG tablet Take 25 mg by mouth daily.   colchicine 0.6 MG tablet Take 0.6 mg by mouth daily as needed.   DULoxetine (CYMBALTA) 60 MG capsule Take 60 mg by mouth daily.   fexofenadine (ALLEGRA) 180 MG tablet Take 180 mg by mouth daily.   gabapentin (NEURONTIN) 100 MG capsule Take 1 capsule (100 mg total) by mouth 2 (two) times daily.   guaiFENesin (MUCINEX) 600 MG 12 hr tablet Take by mouth 2 (two) times daily.   insulin lispro (HUMALOG) 100 UNIT/ML KwikPen Inject 5 Units into the skin 2 (two) times daily with a meal.   Insulin Pen Needle 30G X 5 MM MISC 1 Device by Does not apply route daily. 3/16"   lamoTRIgine (LAMICTAL) 25 MG tablet Take 25 mg by mouth daily. DX: Unspecified convulsions   LANTUS SOLOSTAR 100 UNIT/ML Solostar Pen Inject 15 Units into the skin at bedtime.   levETIRAcetam (KEPPRA) 1000 MG tablet Take 1,000 mg by mouth daily. 9:00 am and 9:00 pm   levETIRAcetam (KEPPRA) 500 MG tablet Take 500 mg by mouth at bedtime. Along with 1000 mg for a total on 1500 mg in the pm (see other listing)   lidocaine (LIDODERM) 5 % Place 1 patch onto the skin in the morning. Remove & Discard patch within 12 hours or as directed by MD   Lidocaine-Glycerin (PREPARATION H EX) Apply topically. Maximum Strength  (phenyleph-pramoxin-glycr-w.pet) [OTC] cream; 0.25-1 %; rectal Special Instructions: As needed for rectal itching/hemorrhoids. Once A Day   LORazepam (ATIVAN) 2 MG/ML injection Inject 0.5 mLs (1 mg total) into the vein every 15 (fifteen) minutes as needed.   melatonin 3 MG TABS tablet Take 3 mg by mouth at bedtime.   metoCLOPramide (REGLAN) 5 MG tablet Take 5 mg by  mouth 4 (four) times daily.   NON FORMULARY Diet - NAS, Cons CHO   omeprazole (PRILOSEC) 40 MG capsule Take 40 mg by mouth daily.   rOPINIRole (REQUIP) 1 MG tablet Take 1 mg by mouth at bedtime.   rosuvastatin (CRESTOR) 20 MG tablet Take 20 mg by mouth daily.   tiZANidine (ZANAFLEX) 2 MG tablet Take 2 mg by mouth every 6 (six) hours as needed for muscle spasms (for back and sciatic pain).   No facility-administered encounter medications on file as of 03/27/2022.     SIGNIFICANT DIAGNOSTIC EXAMS   PREVIOUS    03-06-21 chest x-ray: left lower lobe infiltrate likely representing pneumonia   03-26-21: lumbar x-ray: mild to moderate degenerative changes in lumbar spine  03-27-21; DEXA scan: t score -1.777  06-22-21: ct of abdomen and pelvis:  Left colonic diverticulosis.  No active diverticulitis. Bilateral hydronephrosis to the level of the bladder. No obstructing stones. Urinary bladder is distended with Foley catheter in place. Aortic atherosclerosis.  07-06-21: ct head:  No intracranial abnormality or other acute findings. Chronic bilateral maxillary sinus disease.  07-06-21: ct of abdomen and pelvis:  1. Heterogeneous enhancement of the left kidney with mild left perinephric edema, suspicious for pyelonephritis. There is also mild bladder wall thickening and perivesicular fat stranding with Foley catheter in place. Improvement in bilateral hydronephrosis from prior exam. 2. Stool distends the rectum at 7.6 cm with mild distal rectal wall thickening, suspicious for fecal impaction. 3. Colonic diverticulosis without  diverticulitis. Aortic Atherosclerosis   07-07-21: MRI of head:  No acute intracranial finding. No sign of acute infarction. No lesion seen to explain seizure. The patient does have mild chronic small-vessel ischemic changes considering age affecting the pons, thalami and cerebral hemispheric white matter. Left mastoid effusion  09-14-21: ct of head:  No acute intracranial abnormality. 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 New left sphenoid sinus air-fluid level, which may be seen with acute sinusitis.  09-28-21: ct of head No acute intracranial findings are seen in noncontrast CT brain. Chronic sinusitis.  Bilateral mastoid effusions.  NO NEW EXAMS       LABS REVIEWED PREVIOUS   03-06-21: wbc 5.3; hgb 8.8; hct 27.1; mcv 93.4 plt 262; glucose 453; bun 66; creat 1.30; k+ 4.6; na++ 135; ca 9.0; GFR 41; d-dimer: 2.16; CRP 3.0  03-13-21: d-dimer: 1.49 03-20-21: hep C nr; d-dimer 0.86 03-27-21: d-dimer 0.52 03-31-21: wbc 5.5; hgb 8.5; hct 26.8; mcv 93.4 plt 203; hgb a1c 7.6; chol 130; ldl 68; trig 138; hdl 34; urine micro-albumin 25.1 06-12-21: uric acid 5.9 06-15-21: wbc 6.0; hgb 9.8; hct 29.4; mcv 90,7 plt 242; glucose 187; bun 65; creat 1.33; k+ 4.2; na++ 131; ca 8.9; GFR 40; liver normal albumin 3.3  06-19-21: wbc 7.3; hgb 9.9; hct 30.1; mcv 89,9 plt 306; glucose 115; bun 29; creat 1.05; k+ 4.1; na++ 131; ca 8.7; GFR 53; liver normal albumin 3.1; urine micro-albumin 85.3 06-22-21; wbc 11.8; hgb 10.4; hct 30.9; mcv 89,3 plt 375; glucose 288; bun 44; creat 1.46; k+ 3.5; na++ 125; ca 8.3 GFR 36; liver normal albumin 3.3; lipase 37  07-06-21: wbc 7.6; hgb 9.7; hct 29.8; mcv 89,2 plt 409; glucose 92; bun 44; creat 1.09; k+ 5,3l na++ 136; ca 9.4; GFR 51; liver normal albumin 3.4  Hgb A1C: 8.3; urine culture: proteus mirabilis; + 30,000 colonies klebsiella pneumoniae  08-29-21: wbc 5.6; hgb 9.0; hct 27.6; mcv 94.2 plt 244; glucose 106; bun 80; creat 1.35; k+ 5.8; na++ 134; ca 9.0;  GFR 39 liver normal  albumin 3.5 vit B 12: 446; folate 7.7; iron 79 tibc 280; ferritin 178 09-04-21: glucose 66; bun 79; creat 1.44; k+ 5.5; na++ 135; ca 9.2; GFR 36 09-14-21: wbc 6.2; hgb 8.9; hct 27.1; mcv 96.1 plt 217; glucose 171; bun 58; creat 1.69; k+ 5.8; na++ 134; ca 8.7; GFR 30; urine culture: multiple species 09-15-20: k+ 6.3 09-16-21: k+ 5.0  09-28-21: wbc 6.0; hgb 9.2; hct 27.9; mcv 95.2 plt 252; glucose 160; bun 57; creat 1.33; k+ 5.1; na++ 133; ca 8.9; GFR 40; mag 1.8; liver normal albumin 3.8  10-17-21: wbc 5.9; hgb 8.9; hct 26.6; mcv 95.3 plt 223; glucose 171; bun 44; creat 1.31;k+ 4.8; na++ 134; ca 8.6; gfr 40. D-dimer 1.21; crp 3.2  10-27-21: hgb a1c 6.5 11-04-21: CRp 21.2; d-dimer 1.46 11-24-21: wbc 5.4; hgb 8.7; hct 27.5; mcv 94.2 plt 218; sed rate 80; ana: neg; crp 0.6 01-15-22: hgb a1c 6.7 chol 148; ldl 87; trig 112; hdl 39; tsh 5.663  02-11-22: urine culture: multiple bacteria    NO NEW LABS   Review of Systems  Constitutional:  Negative for malaise/fatigue.  Respiratory:  Negative for cough and shortness of breath.   Cardiovascular:  Negative for chest pain, palpitations and leg swelling.  Gastrointestinal:  Negative for abdominal pain, constipation and heartburn.  Musculoskeletal:  Negative for back pain, joint pain and myalgias.  Skin: Negative.   Neurological:  Negative for dizziness.  Psychiatric/Behavioral:  The patient is not nervous/anxious.    Physical Exam Constitutional:      General: She is not in acute distress.    Appearance: She is well-developed. She is not diaphoretic.  Neck:     Thyroid: No thyromegaly.  Cardiovascular:     Rate and Rhythm: Normal rate and regular rhythm.     Pulses: Normal pulses.     Heart sounds: Normal heart sounds.  Pulmonary:     Effort: Pulmonary effort is normal. No respiratory distress.     Breath sounds: Normal breath sounds.  Abdominal:     General: Bowel sounds are normal. There is no distension.     Palpations: Abdomen is soft.      Tenderness: There is no abdominal tenderness.  Musculoskeletal:     Cervical back: Neck supple.     Right lower leg: No edema.     Left lower leg: No edema.     Comments: Does not move legs; able to move upper extremities   left upper extremity is weak       Lymphadenopathy:     Cervical: No cervical adenopathy.  Skin:    General: Skin is warm and dry.  Neurological:     Mental Status: She is alert and oriented to person, place, and time.  Psychiatric:        Mood and Affect: Mood normal.      ASSESSMENT/ PLAN:  TODAY  Aortic atherosclerosis Hypertension associated with stage 3 chronic kidney disease due to type 2 diabetes mellitus CKD stage 3 due to type 2 diabetes mellitus Diabetic peripheral neuropathy   Will continue current medications Will continue current plan of care Will continue to monitor her status.    Time spent with patient 40 minutes: medications; dietary plan of care.    [name] NP [facility]   [phone]

## 2022-04-09 ENCOUNTER — Encounter (HOSPITAL_COMMUNITY)
Admission: RE | Admit: 2022-04-09 | Discharge: 2022-04-09 | Disposition: A | Discharge status: Home / Self Care | Payer: Medicare HMO | Source: Skilled Nursing Facility | Attending: Adult Health | Admitting: Adult Health

## 2022-04-09 DIAGNOSIS — E785 Hyperlipidemia, unspecified: Secondary | ICD-10-CM | POA: Diagnosis not present

## 2022-04-09 LAB — HEPATIC FUNCTION PANEL
ALT: 13 U/L (ref 0–44)
AST: 14 U/L — ABNORMAL LOW (ref 15–41)
Albumin: 3.3 g/dL — ABNORMAL LOW (ref 3.5–5.0)
Alkaline Phosphatase: 50 U/L (ref 38–126)
Bilirubin, Direct: <0.1 mg/dL (ref 0.0–0.2)
Total Bilirubin: 0.4 mg/dL (ref 0.3–1.2)
Total Protein: 6 g/dL — ABNORMAL LOW (ref 6.5–8.1)

## 2022-04-09 LAB — LIPID PANEL
Cholesterol: 143 mg/dL (ref 0–200)
HDL: 48 mg/dL (ref >40)
LDL Cholesterol: 81 mg/dL (ref 0–99)
Total CHOL/HDL Ratio: 3 RATIO
Triglycerides: 71 mg/dL (ref <150)
VLDL: 14 mg/dL (ref 0–40)

## 2022-04-09 LAB — TSH: TSH: 3.502 u[IU]/mL (ref 0.350–4.500)

## 2022-04-16 ENCOUNTER — Non-Acute Institutional Stay (SKILLED_NURSING_FACILITY): Payer: Medicare HMO | Admitting: Adult Health

## 2022-04-16 ENCOUNTER — Encounter: Payer: Self-pay | Admitting: Adult Health

## 2022-04-16 DIAGNOSIS — I129 Hypertensive chronic kidney disease with stage 1 through stage 4 chronic kidney disease, or unspecified chronic kidney disease: Secondary | ICD-10-CM

## 2022-04-16 DIAGNOSIS — Z794 Long term (current) use of insulin: Secondary | ICD-10-CM

## 2022-04-16 DIAGNOSIS — N1831 Chronic kidney disease, stage 3a: Secondary | ICD-10-CM

## 2022-04-16 DIAGNOSIS — R569 Unspecified convulsions: Secondary | ICD-10-CM | POA: Diagnosis not present

## 2022-04-16 DIAGNOSIS — E1122 Type 2 diabetes mellitus with diabetic chronic kidney disease: Secondary | ICD-10-CM

## 2022-04-16 DIAGNOSIS — E785 Hyperlipidemia, unspecified: Secondary | ICD-10-CM

## 2022-04-16 DIAGNOSIS — E1169 Type 2 diabetes mellitus with other specified complication: Secondary | ICD-10-CM | POA: Diagnosis not present

## 2022-04-16 DIAGNOSIS — N1832 Chronic kidney disease, stage 3b: Secondary | ICD-10-CM | POA: Diagnosis not present

## 2022-04-16 NOTE — Progress Notes (Signed)
Location:  [facility] Room Number: 154-W Place of Service:  SNF (31)   CODE STATUS: DNR  Allergies  Allergen Reactions   Ace Inhibitors Other (See Comments)    Hyperkalemia    Codeine    Sulfa Antibiotics     Chief Complaint  Patient presents with   Medical Management of Chronic Issues                                Seizure:  Type 2 diabetes mellitus with diabetic neuropathy with long term current use of insulin:  Hyperlipidemia associated with type 2 diabetes mellitus:  Hypertension associated with type 2 diabetes mellitus    HPI:  She is an 86 year old long term resident of this facility being seen for the management of her chronic illnesses: Seizure:  Type 2 diabetes mellitus with diabetic neuropathy with long term current use of insulin:  Hyperlipidemia associated with type 2 diabetes mellitus:  Hypertension associated with type 2 diabetes mellitus. She does get out of bed on a regular basis. She denies any nausea or vomiting and wants her reglan stopped.   Past Medical History:  Diagnosis Date   Adult failure to thrive    Anemia    Anxiety    Atherosclerosis of aorta (HCC)    Central cord syndrome at C4 level of cervical spinal cord, subsequent encounter ([facility])    CKD (chronic kidney disease)    stage 3   Depression    DM type 2 with diabetic peripheral neuropathy (HCC)    Dysphagia    GERD (gastroesophageal reflux disease)    Gout    High cholesterol    HTN (hypertension)    Hyponatremia    MDD (major depressive disorder)    Neck pain    Neuropathy    Quadriplegia, C1-C4 incomplete (HCC)    Radiculopathy    RLS (restless legs syndrome)    Urinary retention     Past Surgical History:  Procedure Laterality Date   ABDOMINAL HYSTERECTOMY     ANTERIOR CERVICAL DECOMP/DISCECTOMY FUSION N/A 02/05/2021   Procedure: Cervical Three-Four  Anterior cervical decompression/discectomy/fusion;  Surgeon: Ashok Pall, MD;  Location: [facility];  Service:  Neurosurgery;  Laterality: N/A;  RM 20   APPENDECTOMY     BACK SURGERY     BIOPSY  09/22/2021   Procedure: BIOPSY;  Surgeon: [name], DO;  Location: [facility];  Service: Endoscopy;;   CERVICAL DISC SURGERY     ESOPHAGOGASTRODUODENOSCOPY (EGD) WITH PROPOFOL N/A 09/22/2021   Procedure: ESOPHAGOGASTRODUODENOSCOPY (EGD) WITH PROPOFOL;  Surgeon: [name], DO;  Location: [facility];  Service: Endoscopy;  Laterality: N/A;  1:00pm   HAND SURGERY     KNEE SURGERY      Social History   Socioeconomic History   Marital status: Widowed    Spouse name: Not on file   Number of children: Not on file   Years of education: Not on file   Highest education level: Not on file  Occupational History   Not on file  Tobacco Use   Smoking status: Never   Smokeless tobacco: Never  Vaping Use   Vaping Use: Never used  Substance and Sexual Activity   Alcohol use: Never   Drug use: Never   Sexual activity: Never  Other Topics Concern   Not on file  Social History Narrative   Lives at [facility]  Social Determinants of Health   Financial Resource Strain: Not on file  Food Insecurity: Not on file  Transportation Needs: Not on file  Physical Activity: Not on file  Stress: Not on file  Social Connections: Not on file  Intimate Partner Violence: Not on file   Family History  Problem Relation Age of Onset   Cancer Mother    Cardiomyopathy Father    Seizures Sister        childhood   Seizures Brother        in his 33s   Colon cancer Neg Hx       VITAL SIGNS BP 107/73   Pulse 69   Temp 98.3 F (36.8 C)   Resp 20   Ht 5\' 2"  (1.575 m)   Wt 166 lb 14.4 oz (75.7 kg)   SpO2 98%   BMI 30.53 kg/m   Outpatient Encounter Medications as of 04/16/2022  Medication Sig   acetaminophen (TYLENOL) 325 MG tablet Take 650 mg by mouth every 8 (eight) hours.   albuterol (VENTOLIN HFA) 108 (90 Base) MCG/ACT inhaler Inhale 1 puff into the lungs every 4 (four) hours as  needed for wheezing or shortness of breath.   AMBULATORY NON FORMULARY MEDICATION Medication Name:  Continue monthly catheter changes with 72fr catheter at skilled nursing facility.   Artificial Saliva (BIOTENE MOISTURIZING MOUTH MT) Use as directed 1 application. in the mouth or throat at bedtime. 9 pm   aspirin EC 81 MG tablet Take 81 mg by mouth daily. Swallow whole.9 am   BENZONATATE PO Take 100 mg by mouth every 8 (eight) hours as needed (cough).   chlorthalidone (HYGROTON) 25 MG tablet Take 25 mg by mouth daily.   colchicine 0.6 MG tablet Take 0.6 mg by mouth daily as needed.   DULoxetine (CYMBALTA) 60 MG capsule Take 60 mg by mouth daily.   fexofenadine (ALLEGRA) 180 MG tablet Take 180 mg by mouth daily.   gabapentin (NEURONTIN) 300 MG capsule Take 300 mg by mouth 3 (three) times daily.   guaiFENesin (MUCINEX) 600 MG 12 hr tablet Take by mouth 2 (two) times daily.   insulin aspart (NOVOLOG FLEXPEN) 100 UNIT/ML FlexPen Inject 5 Units into the skin 2 (two) times daily.   Insulin Pen Needle 30G X 5 MM MISC 1 Device by Does not apply route daily. 3/16"   lamoTRIgine (LAMICTAL) 25 MG tablet Take 25 mg by mouth daily. DX: Unspecified convulsions   LANTUS SOLOSTAR 100 UNIT/ML Solostar Pen Inject 15 Units into the skin at bedtime.   levETIRAcetam (KEPPRA) 1000 MG tablet Take 1,000 mg by mouth daily. 9:00 am and 9:00 pm   levETIRAcetam (KEPPRA) 500 MG tablet Take 500 mg by mouth at bedtime. Along with 1000 mg for a total on 1500 mg in the pm (see other listing)   lidocaine (LIDODERM) 5 % Place 1 patch onto the skin in the morning. Remove & Discard patch within 12 hours or as directed by MD   Lidocaine-Glycerin (PREPARATION H EX) Apply topically. Maximum Strength (phenyleph-pramoxin-glycr-w.pet) [OTC] cream; 0.25-1 %; rectal Special Instructions: As needed for rectal itching/hemorrhoids. Once A Day   LORazepam (ATIVAN) 2 MG/ML injection Inject 0.5 mLs (1 mg total) into the vein every 15 (fifteen)  minutes as needed.   melatonin 3 MG TABS tablet Take 3 mg by mouth at bedtime.   metoCLOPramide (REGLAN) 5 MG tablet Take 5 mg by mouth 4 (four) times daily.   NON FORMULARY Diet - NAS, Cons CHO  omeprazole (PRILOSEC) 40 MG capsule Take 40 mg by mouth daily.   rOPINIRole (REQUIP) 1 MG tablet Take 1 mg by mouth at bedtime.   rosuvastatin (CRESTOR) 20 MG tablet Take 20 mg by mouth daily.   tiZANidine (ZANAFLEX) 2 MG tablet Take 2 mg by mouth every 6 (six) hours as needed for muscle spasms (for back and sciatic pain).   No facility-administered encounter medications on file as of 04/16/2022.     SIGNIFICANT DIAGNOSTIC EXAMS  PREVIOUS    03-27-21; DEXA scan: t score -1.777  06-22-21: ct of abdomen and pelvis:  Left colonic diverticulosis.  No active diverticulitis. Bilateral hydronephrosis to the level of the bladder. No obstructing stones. Urinary bladder is distended with Foley catheter in place. Aortic atherosclerosis.  07-06-21: ct head:  No intracranial abnormality or other acute findings. Chronic bilateral maxillary sinus disease.  07-06-21: ct of abdomen and pelvis:  1. Heterogeneous enhancement of the left kidney with mild left perinephric edema, suspicious for pyelonephritis. There is also mild bladder wall thickening and perivesicular fat stranding with Foley catheter in place. Improvement in bilateral hydronephrosis from prior exam. 2. Stool distends the rectum at 7.6 cm with mild distal rectal wall thickening, suspicious for fecal impaction. 3. Colonic diverticulosis without diverticulitis. Aortic Atherosclerosis   07-07-21: MRI of head:  No acute intracranial finding. No sign of acute infarction. No lesion seen to explain seizure. The patient does have mild chronic small-vessel ischemic changes considering age affecting the pons, thalami and cerebral hemispheric white matter. Left mastoid effusion  09-14-21: ct of head:  No acute intracranial abnormality. 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 New left sphenoid sinus air-fluid level, which may be seen with acute sinusitis.  09-28-21: ct of head No acute intracranial findings are seen in noncontrast CT brain. Chronic sinusitis.  Bilateral mastoid effusions.  NO NEW EXAMS       LABS REVIEWED PREVIOUS   03-31-21: wbc 5.5; hgb 8.5; hct 26.8; mcv 93.4 plt 203; hgb a1c 7.6; chol 130; ldl 68; trig 138; hdl 34; urine micro-albumin 25.1 06-12-21: uric acid 5.9 06-15-21: wbc 6.0; hgb 9.8; hct 29.4; mcv 90,7 plt 242; glucose 187; bun 65; creat 1.33; k+ 4.2; na++ 131; ca 8.9; GFR 40; liver normal albumin 3.3  06-19-21: wbc 7.3; hgb 9.9; hct 30.1; mcv 89,9 plt 306; glucose 115; bun 29; creat 1.05; k+ 4.1; na++ 131; ca 8.7; GFR 53; liver normal albumin 3.1; urine micro-albumin 85.3 06-22-21; wbc 11.8; hgb 10.4; hct 30.9; mcv 89,3 plt 375; glucose 288; bun 44; creat 1.46; k+ 3.5; na++ 125; ca 8.3 GFR 36; liver normal albumin 3.3; lipase 37  07-06-21: wbc 7.6; hgb 9.7; hct 29.8; mcv 89,2 plt 409; glucose 92; bun 44; creat 1.09; k+ 5,3l na++ 136; ca 9.4; GFR 51; liver normal albumin 3.4  Hgb A1C: 8.3; urine culture: proteus mirabilis; + 30,000 colonies klebsiella pneumoniae  08-29-21: wbc 5.6; hgb 9.0; hct 27.6; mcv 94.2 plt 244; glucose 106; bun 80; creat 1.35; k+ 5.8; na++ 134; ca 9.0; GFR 39 liver normal albumin 3.5 vit B 12: 446; folate 7.7; iron 79 tibc 280; ferritin 178 09-04-21: glucose 66; bun 79; creat 1.44; k+ 5.5; na++ 135; ca 9.2; GFR 36 09-14-21: wbc 6.2; hgb 8.9; hct 27.1; mcv 96.1 plt 217; glucose 171; bun 58; creat 1.69; k+ 5.8; na++ 134; ca 8.7; GFR 30; urine culture: multiple species 09-15-20: k+ 6.3 09-16-21: k+ 5.0  09-28-21: wbc 6.0; hgb 9.2; hct 27.9; mcv 95.2 plt 252; glucose 160; bun  57; creat 1.33; k+ 5.1; na++ 133; ca 8.9; GFR 40; mag 1.8; liver normal albumin 3.8  10-17-21: wbc 5.9; hgb 8.9; hct 26.6; mcv 95.3 plt 223; glucose 171; bun 44; creat 1.31;k+ 4.8; na++ 134; ca 8.6; gfr 40. D-dimer 1.21; crp  3.2  10-27-21: hgb a1c 6.5 11-04-21: CRp 21.2; d-dimer 1.46 11-24-21: wbc 5.4; hgb 8.7; hct 27.5; mcv 94.2 plt 218; sed rate 80; ana: neg; crp 0.6  TODAY  01-15-22: hgb a1c 6.7 chol 148; ldl 87; trig 112; hdl 39; tsh 5.663  02-11-22: urine culture: multiple bacteria  04-09-22: liver normal protein 6.0 albumin 3.3; chol 143; ldl 81; trig 71; hdl 48; tsh 3.502   Review of Systems  Constitutional:  Negative for malaise/fatigue.  Respiratory:  Negative for cough and shortness of breath.   Cardiovascular:  Negative for chest pain, palpitations and leg swelling.  Gastrointestinal:  Negative for abdominal pain, constipation and heartburn.  Musculoskeletal:  Negative for back pain, joint pain and myalgias.  Skin: Negative.   Neurological:  Negative for dizziness.  Psychiatric/Behavioral:  The patient is not nervous/anxious.     Physical Exam Constitutional:      General: She is not in acute distress.    Appearance: She is well-developed. She is not diaphoretic.  Neck:     Thyroid: No thyromegaly.  Cardiovascular:     Rate and Rhythm: Normal rate and regular rhythm.     Pulses: Normal pulses.     Heart sounds: Normal heart sounds.  Pulmonary:     Effort: Pulmonary effort is normal. No respiratory distress.     Breath sounds: Normal breath sounds.  Abdominal:     General: Bowel sounds are normal. There is no distension.     Palpations: Abdomen is soft.     Tenderness: There is no abdominal tenderness.  Genitourinary:    Comments: Foley  Musculoskeletal:     Cervical back: Neck supple.     Right lower leg: No edema.     Left lower leg: No edema.     Comments: Does not move legs; able to move upper extremities   left upper extremity is weak    Lymphadenopathy:     Cervical: No cervical adenopathy.  Skin:    General: Skin is warm and dry.  Neurological:     Mental Status: She is alert and oriented to person, place, and time.  Psychiatric:        Mood and Affect: Mood normal.         ASSESSMENT/ PLAN:  TODAY  Seizure: is stable will continue keppra 1000 mg in the AM and 1500 mg in the PM; lamictal 25 mg daily  has prn ativan; is followed by neurology   2. Type 2 diabetes mellitus with diabetic neuropathy with long term current use of insulin: hgb a1c 6.7 will continue lantus 15 units nightly novolog 5 units twice daily with meals   3. Hyperlipidemia associated with type 2 diabetes mellitus: LDL 81; will continue crestor 20 mg daily   4. Hypertension associated with type 2 diabetes mellitus: b/p 107/73 will continue hygroton 25 mg daily is off lisinopril due to renal function    PREVIOUS   5. GERD without esophagitis: is stable will continue prilosec 40 mg daily will stop reglan per her choice  6. Chronic constipation; is presently off medications will monitor   7. 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 Chronic cough: will continue mucinex 600 mg twice daily   9. Diabetic peripheral neuropathy: will continue gabapentin 300 mg three times daily    10. CKD stage 3b due to type 2 diabetes mellitus: bun 44; creat 1.31; gfr 44  will monitor   11. Normocytic anemia: hgb 8.7 will monitor   12. Urine retention: has foley   13. Chronic anxiety: is stable will monitor  14. Protein calorie malnutrition severe: is stable albumin 3.3 protein 6.0  will continue supplements as directed  15. Aortic atherosclerosis (ct 01-26-21) is on asa and statin  16. Central cord syndrome at C4 level of cervical spine compression subsequent encounter/ cord compression quadriplegia C1-4 incomplete; is on asa 81 mg daily   17. Restless leg syndrome: will continue requip 1 mg nightly      [name] NP [facility]   call [phone]

## 2022-04-23 ENCOUNTER — Encounter: Payer: Self-pay | Admitting: Adult Health

## 2022-04-23 ENCOUNTER — Non-Acute Institutional Stay (SKILLED_NURSING_FACILITY): Payer: Medicare HMO | Admitting: Adult Health

## 2022-04-23 DIAGNOSIS — K219 Gastro-esophageal reflux disease without esophagitis: Secondary | ICD-10-CM

## 2022-04-23 DIAGNOSIS — M810 Age-related osteoporosis without current pathological fracture: Secondary | ICD-10-CM

## 2022-04-23 NOTE — Progress Notes (Signed)
Location:  [place] Room Number: [phone] Place of Service:  SNF (31)   CODE STATUS: dnr   Allergies  Allergen Reactions   Ace Inhibitors Other (See Comments)    Hyperkalemia    Codeine    Sulfa Antibiotics     Chief Complaint  Patient presents with   Acute Visit    Osteoporosis     HPI:  Her t score is -3.391. she is denying any back pain today. She has not had a recent fall. 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 She does get out of bed most days to wheelchair. Her labs do need updating. She is tolerating coming off reglan; will attempt to lower PPI.   Past Medical History:  Diagnosis Date   Adult failure to thrive    Anemia    Anxiety    Atherosclerosis of aorta (HCC)    Central cord syndrome at C4 level of cervical spinal cord, subsequent encounter ([place])    CKD (chronic kidney disease)    stage 3   Depression    DM type 2 with diabetic peripheral neuropathy (HCC)    Dysphagia    GERD (gastroesophageal reflux disease)    Gout    High cholesterol    HTN (hypertension)    Hyponatremia    MDD (major depressive disorder)    Neck pain    Neuropathy    Quadriplegia, C1-C4 incomplete (HCC)    Radiculopathy    RLS (restless legs syndrome)    Urinary retention     Past Surgical History:  Procedure Laterality Date   ABDOMINAL HYSTERECTOMY     ANTERIOR CERVICAL DECOMP/DISCECTOMY FUSION N/A 02/05/2021   Procedure: Cervical Three-Four  Anterior cervical decompression/discectomy/fusion;  Surgeon: Ashok Pall, MD;  Location: [facility];  Service: Neurosurgery;  Laterality: N/A;  RM 20   APPENDECTOMY     BACK SURGERY     BIOPSY  09/22/2021   Procedure: BIOPSY;  Surgeon: [name], DO;  [facility];  Service: Endoscopy;;   CERVICAL DISC SURGERY     ESOPHAGOGASTRODUODENOSCOPY (EGD) WITH PROPOFOL N/A 09/22/2021   Procedure: ESOPHAGOGASTRODUODENOSCOPY (EGD) WITH PROPOFOL;  Surgeon: [name], DO;  [facility];  Service: Endoscopy;  Laterality: N/A;  1:00pm   HAND SURGERY     KNEE SURGERY      Social History   Socioeconomic History   Marital status: Widowed    Spouse name: Not on file   Number of children: Not on file   Years of education: Not on file   Highest education level: Not on file  Occupational History   Not on file  Tobacco Use   Smoking status: Never   Smokeless tobacco: Never  Vaping Use   Vaping Use: Never used  Substance and Sexual Activity   Alcohol use: Never   Drug use: Never   Sexual activity: Never  Other Topics Concern   Not on file  Social History Narrative   Lives at [facility]   Social Determinants of Health   Financial Resource Strain: Not on file  Food Insecurity: Not on file  Transportation Needs: Not on file  Physical Activity: Not on file  Stress: Not on file  Social Connections: Not on file  Intimate Partner Violence: Not on file   Family History  Problem Relation Age of Onset   Cancer Mother    Cardiomyopathy Father    Seizures Sister  childhood   Seizures Brother        in his 27s   Colon cancer Neg Hx       VITAL SIGNS BP 117/63   Pulse 70   Temp 98.3 F (36.8 C) (Skin)   Resp 20   Ht 5\' 2"  (1.575 m)   Wt 166 lb 14.4 oz (75.7 kg)   SpO2 97%   BMI 30.53 kg/m   Outpatient Encounter Medications as of 04/23/2022  Medication Sig   acetaminophen (TYLENOL) 325 MG tablet Take 650 mg by mouth every 8 (eight) hours.   albuterol (VENTOLIN HFA) 108 (90 Base) MCG/ACT inhaler Inhale 1 puff into the lungs every 4 (four) hours as needed for wheezing or shortness of breath.   AMBULATORY NON FORMULARY MEDICATION Medication Name:  Continue monthly catheter changes with 66fr catheter at skilled nursing facility.   Artificial Saliva (BIOTENE MOISTURIZING MOUTH MT) Use as directed 1 application. in the mouth or throat at bedtime. 9 pm   aspirin EC 81 MG tablet Take 81 mg by mouth daily.  Swallow whole.9 am   BENZONATATE PO Take 100 mg by mouth every 8 (eight) hours as needed (cough).   chlorthalidone (HYGROTON) 25 MG tablet Take 25 mg by mouth daily.   colchicine 0.6 MG tablet Take 0.6 mg by mouth daily as needed.   DULoxetine (CYMBALTA) 60 MG capsule Take 60 mg by mouth daily.   fexofenadine (ALLEGRA) 180 MG tablet Take 180 mg by mouth daily.   gabapentin (NEURONTIN) 300 MG capsule Take 300 mg by mouth 3 (three) times daily.   guaiFENesin (MUCINEX) 600 MG 12 hr tablet Take by mouth 2 (two) times daily.   insulin aspart (NOVOLOG FLEXPEN) 100 UNIT/ML FlexPen Inject 5 Units into the skin 2 (two) times daily.   Insulin Pen Needle 30G X 5 MM MISC 1 Device by Does not apply route daily. 3/16"   lamoTRIgine (LAMICTAL) 25 MG tablet Take 25 mg by mouth daily. DX: Unspecified convulsions   LANTUS SOLOSTAR 100 UNIT/ML Solostar Pen Inject 15 Units into the skin at bedtime.   levETIRAcetam (KEPPRA) 1000 MG tablet Take 1,000 mg by mouth 2 (two) times daily. In the morning and at Bedtime   levETIRAcetam (KEPPRA) 500 MG tablet Take 500 mg by mouth at bedtime. Along with 1000 mg for a total on 1500 mg in the pm (see other listing)   lidocaine (LIDODERM) 5 % Place onto the skin. Apply to Right shoulder as needed once a Morning. Remove & Discard patch within 12 hours or as directed by MD   Lidocaine-Glycerin (PREPARATION H EX) Apply topically. Maximum Strength (phenyleph-pramoxin-glycr-w.pet) [OTC] cream; 0.25-1 %; rectal Special Instructions: As needed for rectal itching/hemorrhoids. Once A Day   LORazepam (ATIVAN) 2 MG/ML injection Inject 0.5 mLs (1 mg total) into the vein every 15 (fifteen) minutes as needed.   melatonin 3 MG TABS tablet Take 3 mg by mouth at bedtime.   NON FORMULARY Diet - NAS, Cons CHO   omeprazole (PRILOSEC) 40 MG capsule Take 40 mg by mouth daily.   rOPINIRole (REQUIP) 1 MG tablet Take 1 mg by mouth at bedtime.   rosuvastatin (CRESTOR) 20 MG tablet Take 20 mg by mouth  daily.   tiZANidine (ZANAFLEX) 2 MG tablet Take 2 mg by mouth every 6 (six) hours as needed for muscle spasms (for back and sciatic pain).   No facility-administered encounter medications on file as of 04/23/2022.     SIGNIFICANT DIAGNOSTIC EXAMS  PREVIOUS  03-27-21; DEXA scan: t score -1.777  06-22-21: ct of abdomen and pelvis:  Left colonic diverticulosis.  No active diverticulitis. Bilateral hydronephrosis to the level of the bladder. No obstructing stones. Urinary bladder is distended with Foley catheter in place. Aortic atherosclerosis.  07-06-21: ct head:  No intracranial abnormality or other acute findings. Chronic bilateral maxillary sinus disease.  07-06-21: ct of abdomen and pelvis:  1. Heterogeneous enhancement of the left kidney with mild left perinephric edema, suspicious for pyelonephritis. There is also mild bladder wall thickening and perivesicular fat stranding with Foley catheter in place. Improvement in bilateral hydronephrosis from prior exam. 2. Stool distends the rectum at 7.6 cm with mild distal rectal wall thickening, suspicious for fecal impaction. 3. Colonic diverticulosis without diverticulitis. Aortic Atherosclerosis   07-07-21: MRI of head:  No acute intracranial finding. No sign of acute infarction. No lesion seen to explain seizure. The patient does have mild chronic small-vessel ischemic changes considering age affecting the pons, thalami and cerebral hemispheric white matter. Left mastoid effusion  09-14-21: ct of head:  No acute intracranial abnormality. 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 New left sphenoid sinus air-fluid level, which may be seen with acute sinusitis.  09-28-21: ct of head No acute intracranial findings are seen in noncontrast CT brain. Chronic sinusitis.  Bilateral mastoid effusions.  TODAY  04-15-22; dexa: t score -3.391      LABS REVIEWED PREVIOUS   03-31-21: wbc 5.5; hgb 8.5; hct 26.8; mcv 93.4 plt 203; hgb a1c 7.6;  chol 130; ldl 68; trig 138; hdl 34; urine micro-albumin 25.1 06-12-21: uric acid 5.9 06-15-21: wbc 6.0; hgb 9.8; hct 29.4; mcv 90,7 plt 242; glucose 187; bun 65; creat 1.33; k+ 4.2; na++ 131; ca 8.9; GFR 40; liver normal albumin 3.3  06-19-21: wbc 7.3; hgb 9.9; hct 30.1; mcv 89,9 plt 306; glucose 115; bun 29; creat 1.05; k+ 4.1; na++ 131; ca 8.7; GFR 53; liver normal albumin 3.1; urine micro-albumin 85.3 06-22-21; wbc 11.8; hgb 10.4; hct 30.9; mcv 89,3 plt 375; glucose 288; bun 44; creat 1.46; k+ 3.5; na++ 125; ca 8.3 GFR 36; liver normal albumin 3.3; lipase 37  07-06-21: wbc 7.6; hgb 9.7; hct 29.8; mcv 89,2 plt 409; glucose 92; bun 44; creat 1.09; k+ 5,3l na++ 136; ca 9.4; GFR 51; liver normal albumin 3.4  Hgb A1C: 8.3; urine culture: proteus mirabilis; + 30,000 colonies klebsiella pneumoniae  08-29-21: wbc 5.6; hgb 9.0; hct 27.6; mcv 94.2 plt 244; glucose 106; bun 80; creat 1.35; k+ 5.8; na++ 134; ca 9.0; GFR 39 liver normal albumin 3.5 vit B 12: 446; folate 7.7; iron 79 tibc 280; ferritin 178 09-04-21: glucose 66; bun 79; creat 1.44; k+ 5.5; na++ 135; ca 9.2; GFR 36 09-14-21: wbc 6.2; hgb 8.9; hct 27.1; mcv 96.1 plt 217; glucose 171; bun 58; creat 1.69; k+ 5.8; na++ 134; ca 8.7; GFR 30; urine culture: multiple species 09-15-20: k+ 6.3 09-16-21: k+ 5.0  09-28-21: wbc 6.0; hgb 9.2; hct 27.9; mcv 95.2 plt 252; glucose 160; bun 57; creat 1.33; k+ 5.1; na++ 133; ca 8.9; GFR 40; mag 1.8; liver normal albumin 3.8  10-17-21: wbc 5.9; hgb 8.9; hct 26.6; mcv 95.3 plt 223; glucose 171; bun 44; creat 1.31;k+ 4.8; na++ 134; ca 8.6; gfr 40. D-dimer 1.21; crp 3.2  10-27-21: hgb a1c 6.5 11-04-21: CRp 21.2; d-dimer 1.46 11-24-21: wbc 5.4; hgb 8.7; hct 27.5; mcv 94.2 plt 218; sed rate 80; ana: neg; crp 0.6 01-15-22: hgb a1c 6.7 chol 148; ldl 87; trig 112;  hdl 39; tsh 5.663  02-11-22: urine culture: multiple bacteria  04-09-22: liver normal protein 6.0 albumin 3.3; chol 143; ldl 81; trig 71; hdl 48; tsh 3.502   NO NEW LABS.    Review of Systems  Constitutional:  Negative for malaise/fatigue.  Respiratory:  Negative for cough and shortness of breath.   Cardiovascular:  Negative for chest pain, palpitations and leg swelling.  Gastrointestinal:  Negative for abdominal pain, constipation and heartburn.  Musculoskeletal:  Negative for back pain, joint pain and myalgias.  Skin: Negative.   Neurological:  Negative for dizziness.  Psychiatric/Behavioral:  The patient is not nervous/anxious.     Physical Exam Constitutional:      General: She is not in acute distress.    Appearance: She is well-developed. She is not diaphoretic.  Neck:     Thyroid: No thyromegaly.  Cardiovascular:     Rate and Rhythm: Normal rate and regular rhythm.     Pulses: Normal pulses.     Heart sounds: Normal heart sounds.  Pulmonary:     Effort: Pulmonary effort is normal. No respiratory distress.     Breath sounds: Normal breath sounds.  Abdominal:     General: Bowel sounds are normal. There is no distension.     Palpations: Abdomen is soft.     Tenderness: There is no abdominal tenderness.  Genitourinary:    Comments: foley Musculoskeletal:     Cervical back: Neck supple.     Right lower leg: No edema.     Left lower leg: No edema.     Comments:  Does not move legs; able to move upper extremities   left upper extremity is weak     Lymphadenopathy:     Cervical: No cervical adenopathy.  Skin:    General: Skin is warm and dry.  Neurological:     Mental Status: She is alert and oriented to person, place, and time.  Psychiatric:        Mood and Affect: Mood normal.       ASSESSMENT/ PLAN:  TODAY  Post menopausal osteoporosis: will begin prolia 60 mg twice yearly; will check cbc; cmp; vit D; hgb a1c GERD without esophagitis: will lower prilosec to 20 mg daily will monitor    [name] NP [facility]   call [phone]

## 2022-04-23 NOTE — Progress Notes (Signed)
Location:  [place] Room Number: [phone] Place of Service:  SNF (31)   CODE STATUS: DNR  Allergies  Allergen Reactions   Ace Inhibitors Other (See Comments)    Hyperkalemia    Codeine    Sulfa Antibiotics     Chief Complaint  Patient presents with   Acute Visit    Osteoporosis     HPI:  Her t score is -3.391. she does get out of bed daily to wheelchair. She is not having any back pain today. Her last labs are 62 months old and will need updating. She is at risk for falls due to her lack of mobility. She has not had any recent falls. She has had a fall in 2022 resulting in   Past Medical History:  Diagnosis Date   Adult failure to thrive    Anemia    Anxiety    Atherosclerosis of aorta (HCC)    Central cord syndrome at C4 level of cervical spinal cord, subsequent encounter ([place])    CKD (chronic kidney disease)    stage 3   Depression    DM type 2 with diabetic peripheral neuropathy (HCC)    Dysphagia    GERD (gastroesophageal reflux disease)    Gout    High cholesterol    HTN (hypertension)    Hyponatremia    MDD (major depressive disorder)    Neck pain    Neuropathy    Quadriplegia, C1-C4 incomplete (HCC)    Radiculopathy    RLS (restless legs syndrome)    Urinary retention     Past Surgical History:  Procedure Laterality Date   ABDOMINAL HYSTERECTOMY     ANTERIOR CERVICAL DECOMP/DISCECTOMY FUSION N/A 02/05/2021   Procedure: Cervical Three-Four  Anterior cervical decompression/discectomy/fusion;  Surgeon: Ashok Pall, MD;  Location: [place];  Service: Neurosurgery;  Laterality: N/A;  RM 20   APPENDECTOMY     BACK SURGERY     BIOPSY  09/22/2021   Procedure: BIOPSY;  Surgeon: [name], DO;  [facility];  Service: Endoscopy;;   CERVICAL DISC SURGERY     ESOPHAGOGASTRODUODENOSCOPY (EGD) WITH PROPOFOL N/A 09/22/2021   Procedure: ESOPHAGOGASTRODUODENOSCOPY (EGD) WITH PROPOFOL;  Surgeon: [name], DO;  [facility];  Service: Endoscopy;  Laterality: N/A;  1:00pm   HAND SURGERY     KNEE SURGERY      Social History   Socioeconomic History   Marital status: Widowed    Spouse name: Not on file   Number of children: Not on file   Years of education: Not on file   Highest education level: Not on file  Occupational History   Not on file  Tobacco Use   Smoking status: Never   Smokeless tobacco: Never  Vaping Use   Vaping Use: Never used  Substance and Sexual Activity   Alcohol use: Never   Drug use: Never   Sexual activity: Never  Other Topics Concern   Not on file  Social History Narrative   [facility] at [facility]   Social Determinants of Health   Financial Resource Strain: Not on file  Food Insecurity: Not on file  Transportation Needs: Not on file  Physical Activity: Not on file  Stress: Not on file  Social Connections: Not on file  Intimate Partner Violence: Not on file   Family History  Problem Relation Age of Onset   Cancer Mother    Cardiomyopathy Father    Seizures Sister  childhood   Seizures Brother        in his 50s   Colon cancer Neg Hx       VITAL SIGNS BP 117/63   Pulse 70   Temp 98.3 F (36.8 C) (Skin)   Resp 20   Ht 5\' 2"  (1.575 m)   Wt 166 lb 14.4 oz (75.7 kg)   SpO2 97%   BMI 30.53 kg/m   Outpatient Encounter Medications as of 04/23/2022  Medication Sig   acetaminophen (TYLENOL) 325 MG tablet Take 650 mg by mouth every 8 (eight) hours.   albuterol (VENTOLIN HFA) 108 (90 Base) MCG/ACT inhaler Inhale 1 puff into the lungs every 4 (four) hours as needed for wheezing or shortness of breath.   AMBULATORY NON FORMULARY MEDICATION Medication Name:  Continue monthly catheter changes with 10fr catheter at skilled nursing facility.   Artificial Saliva (BIOTENE MOISTURIZING MOUTH MT) Use as directed 1 application. in the mouth or throat at bedtime. 9 pm   aspirin EC 81 MG tablet Take 81 mg by mouth daily. Swallow whole.9 am    BENZONATATE PO Take 100 mg by mouth every 8 (eight) hours as needed (cough).   chlorthalidone (HYGROTON) 25 MG tablet Take 25 mg by mouth daily.   colchicine 0.6 MG tablet Take 0.6 mg by mouth daily as needed.   DULoxetine (CYMBALTA) 60 MG capsule Take 60 mg by mouth daily.   fexofenadine (ALLEGRA) 180 MG tablet Take 180 mg by mouth daily.   gabapentin (NEURONTIN) 300 MG capsule Take 300 mg by mouth 3 (three) times daily.   guaiFENesin (MUCINEX) 600 MG 12 hr tablet Take by mouth 2 (two) times daily.   insulin aspart (NOVOLOG FLEXPEN) 100 UNIT/ML FlexPen Inject 5 Units into the skin 2 (two) times daily.   Insulin Pen Needle 30G X 5 MM MISC 1 Device by Does not apply route daily. 3/16"   lamoTRIgine (LAMICTAL) 25 MG tablet Take 25 mg by mouth daily. DX: Unspecified convulsions   LANTUS SOLOSTAR 100 UNIT/ML Solostar Pen Inject 15 Units into the skin at bedtime.   levETIRAcetam (KEPPRA) 1000 MG tablet Take 1,000 mg by mouth 2 (two) times daily. In the morning and at Bedtime   levETIRAcetam (KEPPRA) 500 MG tablet Take 500 mg by mouth at bedtime. Along with 1000 mg for a total on 1500 mg in the pm (see other listing)   lidocaine (LIDODERM) 5 % Place onto the skin. Apply to Right shoulder as needed once a Morning. Remove & Discard patch within 12 hours or as directed by MD   Lidocaine-Glycerin (PREPARATION H EX) Apply topically. Maximum Strength (phenyleph-pramoxin-glycr-w.pet) [OTC] cream; 0.25-1 %; rectal Special Instructions: As needed for rectal itching/hemorrhoids. Once A Day   LORazepam (ATIVAN) 2 MG/ML injection Inject 0.5 mLs (1 mg total) into the vein every 15 (fifteen) minutes as needed.   melatonin 3 MG TABS tablet Take 3 mg by mouth at bedtime.   NON FORMULARY Diet - NAS, Cons CHO   omeprazole (PRILOSEC) 40 MG capsule Take 40 mg by mouth daily.   rOPINIRole (REQUIP) 1 MG tablet Take 1 mg by mouth at bedtime.   rosuvastatin (CRESTOR) 20 MG tablet Take 20 mg by mouth daily.   tiZANidine  (ZANAFLEX) 2 MG tablet Take 2 mg by mouth every 6 (six) hours as needed for muscle spasms (for back and sciatic pain).   No facility-administered encounter medications on file as of 04/23/2022.     SIGNIFICANT DIAGNOSTIC EXAMS  ASSESSMENT/ PLAN:     [name] NP [facility]  Contact [phone] Monday through Friday 8am- 5pm  After hours call [phone]

## 2022-04-27 ENCOUNTER — Other Ambulatory Visit (HOSPITAL_COMMUNITY)
Admission: RE | Admit: 2022-04-27 | Discharge: 2022-04-27 | Disposition: A | Discharge status: Home / Self Care | Payer: Medicare HMO | Source: Skilled Nursing Facility | Attending: Adult Health | Admitting: Adult Health

## 2022-04-27 DIAGNOSIS — E119 Type 2 diabetes mellitus without complications: Secondary | ICD-10-CM | POA: Insufficient documentation

## 2022-04-27 DIAGNOSIS — E785 Hyperlipidemia, unspecified: Secondary | ICD-10-CM | POA: Diagnosis present

## 2022-04-27 DIAGNOSIS — E559 Vitamin D deficiency, unspecified: Secondary | ICD-10-CM | POA: Diagnosis not present

## 2022-04-27 DIAGNOSIS — R7989 Other specified abnormal findings of blood chemistry: Secondary | ICD-10-CM | POA: Diagnosis not present

## 2022-04-27 DIAGNOSIS — N189 Chronic kidney disease, unspecified: Secondary | ICD-10-CM | POA: Diagnosis not present

## 2022-04-27 LAB — CBC
HCT: 26.2 % — ABNORMAL LOW (ref 36.0–46.0)
Hemoglobin: 8.7 g/dL — ABNORMAL LOW (ref 12.0–15.0)
MCH: 31.4 pg (ref 26.0–34.0)
MCHC: 33.2 g/dL (ref 30.0–36.0)
MCV: 94.6 fL (ref 80.0–100.0)
Platelets: 231 10*3/uL (ref 150–400)
RBC: 2.77 MIL/uL — ABNORMAL LOW (ref 3.87–5.11)
RDW: 14 % (ref 11.5–15.5)
WBC: 6.6 10*3/uL (ref 4.0–10.5)
nRBC: 0 % (ref 0.0–0.2)

## 2022-04-27 LAB — COMPREHENSIVE METABOLIC PANEL
ALT: 16 U/L (ref 0–44)
AST: 14 U/L — ABNORMAL LOW (ref 15–41)
Albumin: 3.4 g/dL — ABNORMAL LOW (ref 3.5–5.0)
Alkaline Phosphatase: 57 U/L (ref 38–126)
Anion gap: 9 (ref 5–15)
BUN: 66 mg/dL — ABNORMAL HIGH (ref 8–23)
CO2: 18 mmol/L — ABNORMAL LOW (ref 22–32)
Calcium: 8.9 mg/dL (ref 8.9–10.3)
Chloride: 105 mmol/L (ref 98–111)
Creatinine, Ser: 1.26 mg/dL — ABNORMAL HIGH (ref 0.44–1.00)
GFR, Estimated: 42 mL/min — ABNORMAL LOW (ref >60)
Glucose, Bld: 152 mg/dL — ABNORMAL HIGH (ref 70–99)
Potassium: 4.7 mmol/L (ref 3.5–5.1)
Sodium: 132 mmol/L — ABNORMAL LOW (ref 135–145)
Total Bilirubin: 0.3 mg/dL (ref 0.3–1.2)
Total Protein: 6.7 g/dL (ref 6.5–8.1)

## 2022-04-27 LAB — HEMOGLOBIN A1C
Hgb A1c MFr Bld: 7 % — ABNORMAL HIGH (ref 4.8–5.6)
Mean Plasma Glucose: 154.2 mg/dL

## 2022-04-27 LAB — VITAMIN D 25 HYDROXY (VIT D DEFICIENCY, FRACTURES): Vit D, 25-Hydroxy: 37.96 ng/mL (ref 30–100)

## 2022-04-30 ENCOUNTER — Non-Acute Institutional Stay (SKILLED_NURSING_FACILITY): Payer: Medicare HMO | Admitting: Internal Medicine

## 2022-04-30 ENCOUNTER — Encounter: Payer: Self-pay | Admitting: Internal Medicine

## 2022-04-30 DIAGNOSIS — E1122 Type 2 diabetes mellitus with diabetic chronic kidney disease: Secondary | ICD-10-CM | POA: Diagnosis not present

## 2022-04-30 DIAGNOSIS — I10 Essential (primary) hypertension: Secondary | ICD-10-CM | POA: Diagnosis not present

## 2022-04-30 DIAGNOSIS — G8252 Quadriplegia, C1-C4 incomplete: Secondary | ICD-10-CM

## 2022-04-30 DIAGNOSIS — E039 Hypothyroidism, unspecified: Secondary | ICD-10-CM | POA: Diagnosis not present

## 2022-04-30 DIAGNOSIS — Z794 Long term (current) use of insulin: Secondary | ICD-10-CM | POA: Diagnosis not present

## 2022-04-30 DIAGNOSIS — R627 Adult failure to thrive: Secondary | ICD-10-CM | POA: Diagnosis not present

## 2022-04-30 DIAGNOSIS — N1832 Chronic kidney disease, stage 3b: Secondary | ICD-10-CM | POA: Diagnosis not present

## 2022-04-30 DIAGNOSIS — E43 Unspecified severe protein-calorie malnutrition: Secondary | ICD-10-CM

## 2022-04-30 NOTE — Assessment & Plan Note (Signed)
BP controlled; no change in antihypertensive medications  

## 2022-04-30 NOTE — Assessment & Plan Note (Signed)
CKD stage IIIb is present with creatinine 1.26 and GFR of 42.  Med list reviewed; no change indicated as CKD is stable.  1C is at goal at 7%.  No change in diabetic regimen indicated.

## 2022-04-30 NOTE — Assessment & Plan Note (Addendum)
She and her daughter validate anorexia.  She is requesting a daily multivitamin.  Clinically she is depressed although she denies this.  She is on Cymbalta

## 2022-04-30 NOTE — Progress Notes (Signed)
NURSING HOME LOCATION:  [facility] ROOM NUMBER:  154 W  CODE STATUS:  DNR  PCP:  [name] NP  This is a nursing facility follow up visit of chronic medical diagnoses & to document compliance with Regulation 483.30 (c) in [employer] [place] [employer] Phase 2 which mandates caregiver visit ( visits can alternate among physician, PA or NP as per statutes) within 10 days of 30 days / 60 days/ 90 days post admission to SNF date    Interim medical record and care since last SNF visit was updated with review of diagnostic studies and change in clinical status since last visit were documented.  HPI: She is a permanent resident this facility with medical diagnoses of adult failure to thrive, chronic anemia, atherosclerosis of the aorta, history of central cord syndrome at C4, diabetes with CKD stage III, GERD, history of gout, dyslipidemia, essential hypertension, major depressive disorder, and peripheral neuropathy.  Current end exhibit mild hyponatremia with a sodium of 132.  Creatinine is 1.26 with a GFR of 42 indicating stage IIIb CKD.  It is essentially stable.  Albumin is minimally reduced at 3.4 but total protein is normal at 6.7.  These actually indicate improved values as prior albumin was 3.3 and total protein was 6.   Chronic, normocytic anemia is present with H/H of 8.7/26.2.  Is essentially stable as values were 8.7/27.5 in March of this year. TSH is 3.502; down from 5.663 in May.  A1c is at goal at 7%. LDL is 81.  Review of systems: Her major complaint is discomfort related to a "spot on my bottom."  She states that the Wound Care Nurse told her that the tissues are too wet and a "patch" has been applied.  She does describe some constipation.  She has anorexia and is requesting a "multivitamin."  Her daughter states she only eats breakfast and otherwise will snack on items that she brings in such as watermelon.  She describes occasional left upper quadrant  discomfort. She also has occasional dyspnea but no paroxysmal nocturnal dyspnea.  She describes numbness and tingling in all extremities , progressive in the hands.  She has a follow-up appoint with her Neurosurgeon in January 2024.  She states she has been told she snores but does not have apnea.  Flat affect; she denies anxiety or depression.  Constitutional: No fever, significant weight change despite subjective anorexia  Eyes: No redness, discharge, pain, vision change ENT/mouth: No nasal congestion,  purulent discharge, earache, change in hearing, sore throat  Cardiovascular: No chest pain, palpitations , edema  Respiratory: No cough, sputum production, hemoptysis Gastrointestinal: No heartburn, dysphagia, nausea /vomiting, rectal bleeding, melena Genitourinary: No dysuria, hematuria, pyuria, incontinence, nocturia Musculoskeletal: No joint stiffness, joint swelling, pain Neurologic: No dizziness, headache, syncope, seizures Psychiatric: No significant insomnia Endocrine: No change in hair/skin/nails, excessive thirst, excessive hunger, excessive urination  Hematologic/lymphatic: No significant bruising, lymphadenopathy, abnormal bleeding Allergy/immunology: No itchy/watery eyes, significant sneezing, urticaria, angioedema  Physical exam:  Pertinent or positive findings: Affect is markedly flat even though she denies depression.  She has complete dentures.  Second heart sound is increased.  Abdomen is protuberant.  Trace edema is noted at the sock line.  Posterior tibial pulses are decreased.  The right upper extremity is stronger than the left upper extremity to opposition.  There is minimal opposition in the lower extremities.  Interosseous wasting in the hands is present.  She has minor vitiliginous spotting over the right shin.  General appearance: Adequately nourished; no acute distress, increased work of breathing is present.   Lymphatic: No lymphadenopathy about the head, neck,  axilla. Eyes: No conjunctival inflammation or lid edema is present. There is no scleral icterus. Ears:  External ear exam shows no significant lesions or deformities.   Nose:  External nasal examination shows no deformity or inflammation. Nasal mucosa are pink and moist without lesions, exudates Oral exam:  Lips and gums are healthy appearing. There is no oropharyngeal erythema or exudate. Neck:  No thyromegaly, masses, tenderness noted.    Heart:  Normal rate and regular rhythm. S1 normal without gallop, murmur, click, rub .  Lungs: Chest clear to auscultation without wheezes, rhonchi, rales, rubs. Abdomen: Bowel sounds are normal. Abdomen is soft and nontender with no organomegaly, hernias, masses. GU: Deferred  Extremities:  No cyanosis, clubbing  Neurologic exam :Balance, Rhomberg, finger to nose testing could not be completed due to clinical state Skin: Warm & dry w/o tenting. No significant lesions or rash.  See summary under each active problem in the Problem List with associated updated therapeutic plan

## 2022-04-30 NOTE — Assessment & Plan Note (Signed)
In May TSH was 5.663 indicating mild hypothyroidism; TSH is current and therapeutic at 3.502.  No change indicated.  Monitor annually.

## 2022-04-30 NOTE — Assessment & Plan Note (Signed)
Neurosurgical follow-up and January 2024.  She believes that the paresthesias are progressive and in the upper extremities.

## 2022-04-30 NOTE — Patient Instructions (Signed)
See assessment and plan under each diagnosis in the problem list and acutely for this visit 

## 2022-04-30 NOTE — Assessment & Plan Note (Signed)
Early albumin is stable @ 3.4 and total protein from 6 to 6.7.  Nutritionist continues to follow at the SNF.

## 2022-05-22 ENCOUNTER — Non-Acute Institutional Stay (SKILLED_NURSING_FACILITY): Payer: Medicare HMO | Admitting: Adult Health

## 2022-05-22 ENCOUNTER — Encounter: Payer: Self-pay | Admitting: Adult Health

## 2022-05-22 DIAGNOSIS — E1122 Type 2 diabetes mellitus with diabetic chronic kidney disease: Secondary | ICD-10-CM

## 2022-05-22 DIAGNOSIS — L03031 Cellulitis of right toe: Secondary | ICD-10-CM

## 2022-05-22 DIAGNOSIS — N1832 Chronic kidney disease, stage 3b: Secondary | ICD-10-CM | POA: Diagnosis not present

## 2022-05-22 DIAGNOSIS — N183 Chronic kidney disease, stage 3 unspecified: Secondary | ICD-10-CM

## 2022-05-22 DIAGNOSIS — Z794 Long term (current) use of insulin: Secondary | ICD-10-CM

## 2022-05-22 NOTE — Progress Notes (Signed)
Location:  [place] Room Number: NO/154/W Place of Service:  SNF (31) Provider:  Durenda Age, DNP, FNP-BC  Patient Care Team: Gerlene Fee, NP as PCP - General (Geriatric Medicine) [facility], [place] ([place]) [name], DO as Consulting Physician (Gastroenterology)  Extended Emergency Contact Information Primary Emergency Contact: Charlcie Cradle Address: [address] [place],  [place] Home Phone: [phone] Mobile Phone: [phone] Relation: Daughter Secondary Emergency Contact: MERLEAN, PIZZINI Mobile Phone: [phone] Relation: Daughter  Code Status:  DNR  Goals of care: Advanced Directive information    04/16/2022    8:47 AM  Advanced Directives  Does Patient Have a Medical Advance Directive? Yes  Type of Advance Directive Out of facility DNR (pink MOST or yellow form)  Does patient want to make changes to medical advance directive? No - Patient declined  Pre-existing out of facility DNR order (yellow form or pink MOST form) Yellow form placed in chart (order not valid for inpatient use)     Chief Complaint  Patient presents with   Acute Visit    Right great toenail has drainage    HPI:  Pt is a 86 y.o. female seen today for an acute visit regarding right great toenail drainage. He has a PMH of chronic anemia, arthrosclerosis, cervical cord syndrome with quadriplegia, diabetes with CKD III, hyperlipidemia, hypertension and depression. She had a recent right great toenail ingrown removal by podiatry. Right greato toenail  noted to have erythema, warm to touch, swollen and serous drainage.   Last A1c  7.0, 04/27/22, takes Lantus and Novolog. Hoyer lift used for transfers.   Past Medical History:  Diagnosis Date   Adult failure to thrive    Anemia    Anxiety    Atherosclerosis of aorta (HCC)    Central cord syndrome at C4 level of cervical spinal cord, subsequent encounter  ([place])    CKD (chronic kidney disease)    stage 3   Depression    DM type 2 with diabetic peripheral neuropathy (HCC)    Dysphagia    GERD (gastroesophageal reflux disease)    Gout    High cholesterol    HTN (hypertension)    Hyponatremia    MDD (major depressive disorder)    Neck pain    Neuropathy    Quadriplegia, C1-C4 incomplete (HCC)    Radiculopathy    RLS (restless legs syndrome)    Urinary retention    Past Surgical History:  Procedure Laterality Date   ABDOMINAL HYSTERECTOMY     ANTERIOR CERVICAL DECOMP/DISCECTOMY FUSION N/A 02/05/2021   Procedure: Cervical Three-Four  Anterior cervical decompression/discectomy/fusion;  Surgeon: Ashok Pall, MD;  Location: [facility];  Service: Neurosurgery;  Laterality: N/A;  RM 20   APPENDECTOMY     BACK SURGERY     BIOPSY  09/22/2021   Procedure: BIOPSY;  Surgeon: [name], DO;  [facility];  Service: Endoscopy;;   CERVICAL DISC SURGERY     ESOPHAGOGASTRODUODENOSCOPY (EGD) WITH PROPOFOL N/A 09/22/2021   Procedure: ESOPHAGOGASTRODUODENOSCOPY (EGD) WITH PROPOFOL;  Surgeon: [name], DO;  [facility];  Service: Endoscopy;  Laterality: N/A;  1:00pm   HAND SURGERY     KNEE SURGERY      Allergies  Allergen Reactions   Ace Inhibitors Other (See Comments)    Hyperkalemia    Codeine    Sulfa Antibiotics     Outpatient Encounter  Medications as of 05/22/2022  Medication Sig   acetaminophen (TYLENOL) 325 MG tablet Take 650 mg by mouth every 8 (eight) hours.   albuterol (VENTOLIN HFA) 108 (90 Base) MCG/ACT inhaler Inhale 1 puff into the lungs every 4 (four) hours as needed for wheezing or shortness of breath.   AMBULATORY NON FORMULARY MEDICATION Medication Name:  Continue monthly catheter changes with 47fr catheter at skilled nursing facility.   Artificial Saliva (BIOTENE MOISTURIZING MOUTH MT) Use as directed 1 application. in the mouth or throat at bedtime. 9 pm   aspirin EC 81 MG tablet Take 81 mg  by mouth daily. Swallow whole.9 am   BENZONATATE PO Take 100 mg by mouth every 8 (eight) hours as needed (cough).   chlorthalidone (HYGROTON) 25 MG tablet Take 25 mg by mouth daily.   colchicine 0.6 MG tablet Take 0.6 mg by mouth daily as needed.   DULoxetine (CYMBALTA) 60 MG capsule Take 60 mg by mouth daily.   fexofenadine (ALLEGRA) 180 MG tablet Take 180 mg by mouth daily.   gabapentin (NEURONTIN) 300 MG capsule Take 300 mg by mouth 3 (three) times daily.   guaiFENesin (MUCINEX) 600 MG 12 hr tablet Take by mouth 2 (two) times daily.   insulin aspart (NOVOLOG FLEXPEN) 100 UNIT/ML FlexPen Inject 5 Units into the skin 2 (two) times daily.   Insulin Pen Needle 30G X 5 MM MISC 1 Device by Does not apply route daily. 3/16"   lamoTRIgine (LAMICTAL) 25 MG tablet Take 25 mg by mouth daily. DX: Unspecified convulsions   LANTUS SOLOSTAR 100 UNIT/ML Solostar Pen Inject 15 Units into the skin at bedtime.   levETIRAcetam (KEPPRA) 1000 MG tablet Take 1,000 mg by mouth 2 (two) times daily. In the morning and at Bedtime   levETIRAcetam (KEPPRA) 500 MG tablet Take 500 mg by mouth at bedtime. Along with 1000 mg for a total on 1500 mg in the pm (see other listing)   lidocaine (LIDODERM) 5 % Place onto the skin. Apply to Right shoulder as needed once a Morning. Remove & Discard patch within 12 hours or as directed by MD   Lidocaine-Glycerin (PREPARATION H EX) Apply topically. Maximum Strength (phenyleph-pramoxin-glycr-w.pet) [OTC] cream; 0.25-1 %; rectal Special Instructions: As needed for rectal itching/hemorrhoids. Once A Day   LORazepam (ATIVAN) 2 MG/ML injection Inject 0.5 mLs (1 mg total) into the vein every 15 (fifteen) minutes as needed.   melatonin 3 MG TABS tablet Take 3 mg by mouth at bedtime.   NON FORMULARY Diet - NAS, Cons CHO   omeprazole (PRILOSEC) 40 MG capsule Take 40 mg by mouth daily.   rOPINIRole (REQUIP) 1 MG tablet Take 1 mg by mouth at bedtime.   rosuvastatin (CRESTOR) 20 MG tablet Take  20 mg by mouth daily.   tiZANidine (ZANAFLEX) 2 MG tablet Take 2 mg by mouth every 6 (six) hours as needed for muscle spasms (for back and sciatic pain).   No facility-administered encounter medications on file as of 05/22/2022.    Review of Systems  Constitutional:  Negative for appetite change, chills, fatigue and fever.  HENT:  Negative for congestion, hearing loss, rhinorrhea and sore throat.   Eyes: Negative.   Respiratory:  Negative for cough, shortness of breath and wheezing.   Cardiovascular:  Negative for chest pain, palpitations and leg swelling.  Gastrointestinal:  Negative for abdominal pain, constipation, diarrhea, nausea and vomiting.  Genitourinary:  Negative for dysuria.  Musculoskeletal:  Negative for arthralgias, back pain and myalgias.  Skin:  Positive for wound. Negative for color change and rash.       S/P removal of right great toenail ingrown.  Neurological:  Negative for dizziness, weakness and headaches.       Has generalized weakness  Psychiatric/Behavioral:  Negative for behavioral problems. The patient is not nervous/anxious.        Immunization History  Administered Date(s) Administered   Fluad Quad(high Dose 65+) 06/04/2021   Influenza,inj,quad, With Preservative 06/14/2018   Influenza-Unspecified 06/05/2020   Moderna Covid-19 Vaccine Bivalent Booster 21yrs & up 06/24/2021   Moderna SARS-COV2 Booster Vaccination 04/16/2021   Moderna Sars-Covid-2 Vaccination 10/26/2019, 11/24/2019, 07/24/2020   Pneumococcal Conjugate-13 06/28/2017   Pneumococcal Polysaccharide-23 09/30/2020   Tdap 01/25/2021   Zoster Recombinat (Shingrix) 03/12/2021, 06/04/2021   Pertinent  Health Maintenance Due  Topic Date Due   OPHTHALMOLOGY EXAM  Never done   INFLUENZA VACCINE  03/31/2022   FOOT EXAM  08/04/2022   HEMOGLOBIN A1C  10/28/2022   DEXA SCAN  Completed      09/25/2021    3:03 PM 09/28/2021    3:34 PM 10/01/2021    1:08 PM 01/14/2022   12:33 PM 03/20/2022    2:02  PM  Fall Risk  Falls in the past year? 0  0 0 0  Was there an injury with Fall? 0  0 0 0  Fall Risk Category Calculator 0  0 0 0  Fall Risk Category Low  Low Low Low  Patient Fall Risk Level Low fall risk Moderate fall risk Low fall risk Low fall risk Low fall risk  Patient at Risk for Falls Due to History of fall(s);Impaired balance/gait;Impaired mobility  Impaired balance/gait;Impaired mobility Impaired balance/gait;Impaired mobility Impaired balance/gait;Impaired mobility     Vitals:   05/22/22 1126  BP: 105/61  Pulse: 73  Resp: 18  Temp: (!) 97.4 F (36.3 C)  SpO2: 98%  Weight: 166 lb (75.3 kg)  Height: 5\' 2"  (1.575 m)   Body mass index is 30.36 kg/m.  Physical Exam Constitutional:      Appearance: Normal appearance.  HENT:     Head: Normocephalic and atraumatic.     Nose: Nose normal.     Mouth/Throat:     Mouth: Mucous membranes are moist.  Eyes:     Conjunctiva/sclera: Conjunctivae normal.  Cardiovascular:     Rate and Rhythm: Normal rate and regular rhythm.  Pulmonary:     Effort: Pulmonary effort is normal.     Breath sounds: Normal breath sounds.  Abdominal:     General: Bowel sounds are normal.     Palpations: Abdomen is soft.  Genitourinary:    Comments: Has foley catheter Musculoskeletal:     Cervical back: Normal range of motion.  Skin:    General: Skin is warm and dry.     Comments: Right great toe with erythema and minimal serous drainage, warm to touch  Neurological:     General: No focal deficit present.     Mental Status: She is alert and oriented to person, place, and time.     Comments: Has generalized weakness  Psychiatric:        Mood and Affect: Mood normal.        Behavior: Behavior normal.        Thought Content: Thought content normal.        Judgment: Judgment normal.     Labs reviewed: Recent Labs    09/28/21 1542 10/17/21 0755 04/27/22 0827  NA 133* 134* 132*  K  5.1 4.8 4.7  CL 108 106 105  CO2 19* 17* 18*  GLUCOSE  160* 171* 152*  BUN 57* 44* 66*  CREATININE 1.33* 1.31* 1.26*  CALCIUM 8.9 8.6* 8.9  MG 1.8  --   --    Recent Labs    09/28/21 1542 04/09/22 0817 04/27/22 0827  AST 15 14* 14*  ALT 14 13 16   ALKPHOS 54 50 57  BILITOT 0.3 0.4 0.3  PROT 7.1 6.0* 6.7  ALBUMIN 3.8 3.3* 3.4*   Recent Labs    09/14/21 1623 09/28/21 1542 10/17/21 0755 11/24/21 0811 04/27/22 0827  WBC 6.2 6.2 5.9 5.4 6.6  NEUTROABS 2.0 2.5  --  2.1  --   HGB 8.9* 9.0* 8.9* 8.7* 8.7*  HCT 27.1* 27.9* 26.6* 27.5* 26.2*  MCV 96.1 95.2 95.3 94.2 94.6  PLT 217 252 223 218 231   Lab Results  Component Value Date   TSH 3.502 04/09/2022   Lab Results  Component Value Date   HGBA1C 7.0 (H) 04/27/2022   Lab Results  Component Value Date   CHOL 143 04/09/2022   HDL 48 04/09/2022   LDLCALC 81 04/09/2022   TRIG 71 04/09/2022   CHOLHDL 3.0 04/09/2022    Significant Diagnostic Results in last 30 days:  No results found.  Assessment/Plan  1. Cellulitis of great toe, right -  S/P removal of right great toenail -  has erythema, warm to touch and has serous draingae -  will start on Doxycycline 100 mg BID X 7 days and Florastor 250 mg BID X 10 days -  continue wound treatment daily  2. Type 2 diabetes mellitus with stage 3b chronic kidney disease, with long-term current use of insulin (HCC) Lab Results  Component Value Date   HGBA1C 7.0 (H) 04/27/2022   - CBGs stable, continue Lantus and Novolog  3. CKD stage 3 due to type 2 diabetes mellitus ([place]) Lab Results  Component Value Date   NA 132 (L) 04/27/2022   K 4.7 04/27/2022   CO2 18 (L) 04/27/2022   GLUCOSE 152 (H) 04/27/2022   BUN 66 (H) 04/27/2022   CREATININE 1.26 (H) 04/27/2022   CALCIUM 8.9 04/27/2022   GFRNONAA 42 (L) 04/27/2022   -  stable, will monitor     Family/ staff Communication: Discussed plan of care with resident and charge nurse.  Labs/tests ordered:   None    Durenda Age, DNP, MSN, FNP-BC [facility] [phone] (Monday-Friday 8:00 a.m. - 5:00 p.m.) [phone] (after hours)

## 2022-05-28 DIAGNOSIS — Z1383 Encounter for screening for respiratory disorder NEC: Secondary | ICD-10-CM | POA: Diagnosis not present

## 2022-05-28 DIAGNOSIS — G8252 Quadriplegia, C1-C4 incomplete: Secondary | ICD-10-CM | POA: Diagnosis not present

## 2022-05-28 DIAGNOSIS — Z1159 Encounter for screening for other viral diseases: Secondary | ICD-10-CM | POA: Diagnosis not present

## 2022-06-02 DIAGNOSIS — Z23 Encounter for immunization: Secondary | ICD-10-CM | POA: Diagnosis not present

## 2022-06-08 ENCOUNTER — Non-Acute Institutional Stay (SKILLED_NURSING_FACILITY): Payer: Medicare HMO | Admitting: Adult Health

## 2022-06-08 ENCOUNTER — Encounter: Payer: Self-pay | Admitting: Adult Health

## 2022-06-08 DIAGNOSIS — M87052 Idiopathic aseptic necrosis of left femur: Secondary | ICD-10-CM

## 2022-06-08 DIAGNOSIS — K219 Gastro-esophageal reflux disease without esophagitis: Secondary | ICD-10-CM

## 2022-06-08 DIAGNOSIS — Z1159 Encounter for screening for other viral diseases: Secondary | ICD-10-CM | POA: Diagnosis not present

## 2022-06-08 DIAGNOSIS — M810 Age-related osteoporosis without current pathological fracture: Secondary | ICD-10-CM

## 2022-06-08 DIAGNOSIS — K5909 Other constipation: Secondary | ICD-10-CM

## 2022-06-08 DIAGNOSIS — Z1383 Encounter for screening for respiratory disorder NEC: Secondary | ICD-10-CM | POA: Diagnosis not present

## 2022-06-08 DIAGNOSIS — M87051 Idiopathic aseptic necrosis of right femur: Secondary | ICD-10-CM | POA: Diagnosis not present

## 2022-06-08 DIAGNOSIS — S14124S Central cord syndrome at C4 level of cervical spinal cord, sequela: Secondary | ICD-10-CM

## 2022-06-08 DIAGNOSIS — G8252 Quadriplegia, C1-C4 incomplete: Secondary | ICD-10-CM | POA: Diagnosis not present

## 2022-06-08 NOTE — Progress Notes (Signed)
Location:  [facility] Room Number: 154 Place of Service:  SNF (31)   CODE STATUS: dnr   Allergies  Allergen Reactions   Ace Inhibitors Other (See Comments)    Hyperkalemia    Codeine    Sulfa Antibiotics     Chief Complaint  Patient presents with   Medical Management of Chronic Issues                                  Post menopausal osteoporosis: GERD without esophagitis:  Chronic constipation:    Lumbar radicular syndrome/vascular necrosis bilateral hips:  Chronic cough;    HPI:  She is a 86 year old long term resident of this facility being seen for the management of her chronic illnesses: Post menopausal osteoporosis: GERD without esophagitis:  Chronic constipation:    Lumbar radicular syndrome/vascular necrosis bilateral hips:  Chronic cough. There are no reports of uncontrolled pain; no reports of heartburn; her cough is being managed.   Past Medical History:  Diagnosis Date   Adult failure to thrive    Anemia    Anxiety    Atherosclerosis of aorta (HCC)    Central cord syndrome at C4 level of cervical spinal cord, subsequent encounter ([facility])    CKD (chronic kidney disease)    stage 3   Depression    DM type 2 with diabetic peripheral neuropathy (HCC)    Dysphagia    GERD (gastroesophageal reflux disease)    Gout    High cholesterol    HTN (hypertension)    Hyponatremia    MDD (major depressive disorder)    Neck pain    Neuropathy    Quadriplegia, C1-C4 incomplete (HCC)    Radiculopathy    RLS (restless legs syndrome)    Urinary retention     Past Surgical History:  Procedure Laterality Date   ABDOMINAL HYSTERECTOMY     ANTERIOR CERVICAL DECOMP/DISCECTOMY FUSION N/A 02/05/2021   Procedure: Cervical Three-Four  Anterior cervical decompression/discectomy/fusion;  Surgeon: Ashok Pall, MD;  [facility]: [facility];  Service: Neurosurgery;  Laterality: N/A;  RM 20   APPENDECTOMY     BACK SURGERY     BIOPSY  09/22/2021   Procedure: BIOPSY;   Surgeon: [name], DO;  Location: [facility];  Service: Endoscopy;;   CERVICAL DISC SURGERY     ESOPHAGOGASTRODUODENOSCOPY (EGD) WITH PROPOFOL N/A 09/22/2021   Procedure: ESOPHAGOGASTRODUODENOSCOPY (EGD) WITH PROPOFOL;  Surgeon: [name], DO;  Location: [facility];  Service: Endoscopy;  Laterality: N/A;  1:00pm   HAND SURGERY     KNEE SURGERY      Social History   Socioeconomic History   Marital status: Widowed    Spouse name: Not on file   Number of children: Not on file   Years of education: Not on file   Highest education level: Not on file  Occupational History   Not on file  Tobacco Use   Smoking status: Never   Smokeless tobacco: Never  Vaping Use   Vaping Use: Never used  Substance and Sexual Activity   Alcohol use: Never   Drug use: Never   Sexual activity: Never  Other Topics Concern   Not on file  Social History Narrative   [facility]   Social Determinants of Health   Financial Resource Strain: Not on file  Food Insecurity: Not on file  Transportation Needs:  Not on file  Physical Activity: Not on file  Stress: Not on file  Social Connections: Not on file  Intimate Partner Violence: Not on file   Family History  Problem Relation Age of Onset   Cancer Mother    Cardiomyopathy Father    Seizures Sister        childhood   Seizures Brother        in his 75s   Colon cancer Neg Hx       VITAL SIGNS BP (!) 142/68   Pulse 70   Temp (!) 97.5 F (36.4 C)   Resp 16   Ht 5\' 2"  (1.575 m)   Wt 166 lb 14.4 oz (75.7 kg)   SpO2 94%   BMI 30.53 kg/m   Outpatient Encounter Medications as of 06/08/2022  Medication Sig   denosumab (PROLIA) 60 MG/ML SOSY injection Inject 60 mg into the skin every 6 (six) months.   omeprazole (PRILOSEC) 20 MG capsule Take 20 mg by mouth daily.   acetaminophen (TYLENOL) 325 MG tablet Take 650 mg by mouth every 8 (eight) hours.   albuterol (VENTOLIN HFA) 108 (90 Base) MCG/ACT inhaler  Inhale 1 puff into the lungs every 4 (four) hours as needed for wheezing or shortness of breath.   AMBULATORY NON FORMULARY MEDICATION Medication Name:  Continue monthly catheter changes with 49fr catheter at skilled nursing facility.   Artificial Saliva (BIOTENE MOISTURIZING MOUTH MT) Use as directed 1 application. in the mouth or throat at bedtime. 9 pm   aspirin EC 81 MG tablet Take 81 mg by mouth daily. Swallow whole.9 am   BENZONATATE PO Take 100 mg by mouth every 8 (eight) hours as needed (cough).   chlorthalidone (HYGROTON) 25 MG tablet Take 25 mg by mouth daily.   colchicine 0.6 MG tablet Take 0.6 mg by mouth daily as needed.   DULoxetine (CYMBALTA) 60 MG capsule Take 60 mg by mouth daily.   fexofenadine (ALLEGRA) 180 MG tablet Take 180 mg by mouth daily.   gabapentin (NEURONTIN) 300 MG capsule Take 300 mg by mouth 3 (three) times daily.   guaiFENesin (MUCINEX) 600 MG 12 hr tablet Take by mouth 2 (two) times daily.   insulin aspart (NOVOLOG FLEXPEN) 100 UNIT/ML FlexPen Inject 5 Units into the skin 2 (two) times daily.   Insulin Pen Needle 30G X 5 MM MISC 1 Device by Does not apply route daily. 3/16"   lamoTRIgine (LAMICTAL) 25 MG tablet Take 25 mg by mouth daily. DX: Unspecified convulsions   LANTUS SOLOSTAR 100 UNIT/ML Solostar Pen Inject 15 Units into the skin at bedtime.   levETIRAcetam (KEPPRA) 1000 MG tablet Take 1,000 mg by mouth 2 (two) times daily. In the morning and at Bedtime   levETIRAcetam (KEPPRA) 500 MG tablet Take 500 mg by mouth at bedtime. Along with 1000 mg for a total on 1500 mg in the pm (see other listing)   lidocaine (LIDODERM) 5 % Place onto the skin. Apply to Right shoulder as needed once a Morning. Remove & Discard patch within 12 hours or as directed by MD   Lidocaine-Glycerin (PREPARATION H EX) Apply topically. Maximum Strength (phenyleph-pramoxin-glycr-w.pet) [OTC] cream; 0.25-1 %; rectal Special Instructions: As needed for rectal itching/hemorrhoids. Once A  Day   LORazepam (ATIVAN) 2 MG/ML injection Inject 0.5 mLs (1 mg total) into the vein every 15 (fifteen) minutes as needed.   melatonin 3 MG TABS tablet Take 3 mg by mouth at bedtime.   NON FORMULARY Diet - NAS,  Cons CHO   rOPINIRole (REQUIP) 1 MG tablet Take 1 mg by mouth at bedtime.   rosuvastatin (CRESTOR) 20 MG tablet Take 20 mg by mouth daily.   tiZANidine (ZANAFLEX) 2 MG tablet Take 2 mg by mouth every 6 (six) hours as needed for muscle spasms (for back and sciatic pain).   [DISCONTINUED] omeprazole (PRILOSEC) 40 MG capsule Take 40 mg by mouth daily.   No facility-administered encounter medications on file as of 06/08/2022.     SIGNIFICANT DIAGNOSTIC EXAMS  PREVIOUS     06-22-21: ct of abdomen and pelvis:  Left colonic diverticulosis.  No active diverticulitis. Bilateral hydronephrosis to the level of the bladder. No obstructing stones. Urinary bladder is distended with Foley catheter in place. Aortic atherosclerosis.  07-06-21: ct head:  No intracranial abnormality or other acute findings. Chronic bilateral maxillary sinus disease.  07-06-21: ct of abdomen and pelvis:  1. Heterogeneous enhancement of the left kidney with mild left perinephric edema, suspicious for pyelonephritis. There is also mild bladder wall thickening and perivesicular fat stranding with Foley catheter in place. Improvement in bilateral hydronephrosis from prior exam. 2. Stool distends the rectum at 7.6 cm with mild distal rectal wall thickening, suspicious for fecal impaction. 3. Colonic diverticulosis without diverticulitis. Aortic Atherosclerosis   07-07-21: MRI of head:  No acute intracranial finding. No sign of acute infarction. No lesion seen to explain seizure. The patient does have mild chronic small-vessel ischemic changes considering age affecting the pons, thalami and cerebral hemispheric white matter. Left mastoid effusion  09-14-21: ct of head:  No acute intracranial abnormality. 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 New left sphenoid sinus air-fluid level, which may be seen with acute sinusitis.  09-28-21: ct of head No acute intracranial findings are seen in noncontrast CT brain. Chronic sinusitis.  Bilateral mastoid effusions.  04-15-22; dexa: t score -3.391  NO NEW EXAMS       LABS REVIEWED PREVIOUS   06-12-21: uric acid 5.9 06-15-21: wbc 6.0; hgb 9.8; hct 29.4; mcv 90,7 plt 242; glucose 187; bun 65; creat 1.33; k+ 4.2; na++ 131; ca 8.9; GFR 40; liver normal albumin 3.3  06-19-21: wbc 7.3; hgb 9.9; hct 30.1; mcv 89,9 plt 306; glucose 115; bun 29; creat 1.05; k+ 4.1; na++ 131; ca 8.7; GFR 53; liver normal albumin 3.1; urine micro-albumin 85.3 06-22-21; wbc 11.8; hgb 10.4; hct 30.9; mcv 89,3 plt 375; glucose 288; bun 44; creat 1.46; k+ 3.5; na++ 125; ca 8.3 GFR 36; liver normal albumin 3.3; lipase 37  07-06-21: wbc 7.6; hgb 9.7; hct 29.8; mcv 89,2 plt 409; glucose 92; bun 44; creat 1.09; k+ 5,3l na++ 136; ca 9.4; GFR 51; liver normal albumin 3.4  Hgb A1C: 8.3; urine culture: proteus mirabilis; + 30,000 colonies klebsiella pneumoniae  08-29-21: wbc 5.6; hgb 9.0; hct 27.6; mcv 94.2 plt 244; glucose 106; bun 80; creat 1.35; k+ 5.8; na++ 134; ca 9.0; GFR 39 liver normal albumin 3.5 vit B 12: 446; folate 7.7; iron 79 tibc 280; ferritin 178 09-04-21: glucose 66; bun 79; creat 1.44; k+ 5.5; na++ 135; ca 9.2; GFR 36 09-14-21: wbc 6.2; hgb 8.9; hct 27.1; mcv 96.1 plt 217; glucose 171; bun 58; creat 1.69; k+ 5.8; na++ 134; ca 8.7; GFR 30; urine culture: multiple species 09-15-20: k+ 6.3 09-16-21: k+ 5.0  09-28-21: wbc 6.0; hgb 9.2; hct 27.9; mcv 95.2 plt 252; glucose 160; bun 57; creat 1.33; k+ 5.1; na++ 133; ca 8.9; GFR 40; mag 1.8; liver normal albumin 3.8  10-17-21: wbc  5.9; hgb 8.9; hct 26.6; mcv 95.3 plt 223; glucose 171; bun 44; creat 1.31;k+ 4.8; na++ 134; ca 8.6; gfr 40. D-dimer 1.21; crp 3.2  10-27-21: hgb a1c 6.5 11-04-21: CRp 21.2; d-dimer 1.46 11-24-21: wbc 5.4; hgb 8.7; hct 27.5;  mcv 94.2 plt 218; sed rate 80; ana: neg; crp 0.6 01-15-22: hgb a1c 6.7 chol 148; ldl 87; trig 112; hdl 39; tsh 5.663  02-11-22: urine culture: multiple bacteria  04-09-22: liver normal protein 6.0 albumin 3.3; chol 143; ldl 81; trig 71; hdl 48; tsh 3.502   TODAY  04-27-22; wbc 6.6; hgb 8.7; hct 26.2; mcv 94.6 plt 231; glucose 152; bun 66; creat 1.26; k+ 4.7; na++ 132; ca 8.9; gfr 42; protein 6.7 albumin 3.4 hgb a1c 7.0; vitamin D 37.96    Review of Systems  Constitutional:  Negative for malaise/fatigue.  Respiratory:  Negative for cough and shortness of breath.   Cardiovascular:  Negative for chest pain, palpitations and leg swelling.  Gastrointestinal:  Negative for abdominal pain, constipation and heartburn.  Musculoskeletal:  Negative for back pain, joint pain and myalgias.  Skin: Negative.   Neurological:  Negative for dizziness.  Psychiatric/Behavioral:  The patient is not nervous/anxious.    Physical Exam Constitutional:      General: She is not in acute distress.    Appearance: She is well-developed. She is not diaphoretic.  Neck:     Thyroid: No thyromegaly.  Cardiovascular:     Rate and Rhythm: Normal rate and regular rhythm.     Pulses: Normal pulses.     Heart sounds: Normal heart sounds.  Pulmonary:     Effort: Pulmonary effort is normal. No respiratory distress.     Breath sounds: Normal breath sounds.  Abdominal:     General: Bowel sounds are normal. There is no distension.     Palpations: Abdomen is soft.     Tenderness: There is no abdominal tenderness.  Genitourinary:    Comments: foley Musculoskeletal:     Cervical back: Neck supple.     Right lower leg: No edema.     Left lower leg: No edema.     Comments:  Does not move legs; able to move upper extremities   left upper extremity is weak      Lymphadenopathy:     Cervical: No cervical adenopathy.  Skin:    General: Skin is warm and dry.  Neurological:     Mental Status: She is alert and oriented to  person, place, and time.  Psychiatric:        Mood and Affect: Mood normal.       ASSESSMENT/ PLAN:  TODAY  Post menopausal osteoporosis: t score -3.391 will continue prolia every 6 months   2. GERD without esophagitis: will continue prilosec 20 mg daily is off reglan  3. Chronic constipation: is off medications  4. Lumbar radicular syndrome/vascular necrosis bilateral hips: will continue tylenol 650 mg every 8 hours; gabapentin 300 mg three times daily  5. Chronic cough; will continue mucinex 600 mg twice daily    PREVIOUS   6. Diabetic peripheral neuropathy: will continue gabapentin 300 mg three times daily    7. CKD stage 3b due to type 2 diabetes mellitus: bun 66; creat 1.26; gfr 42  will monitor   8. Normocytic anemia: hgb 8.7 will monitor   9. Urine retention: has foley   10. Chronic anxiety: is stable will monitor  11. Protein calorie malnutrition severe: is stable albumin 3.4 protein 6.7  will  continue supplements as directed  12. Aortic atherosclerosis (ct 01-26-21) is on asa and statin  13. Central cord syndrome at C4 level of cervical spine compression subsequent encounter/ cord compression quadriplegia C1-4 incomplete; is on asa 81 mg daily   14. Restless leg syndrome: will continue requip 1 mg nightly   15. Seizure: is stable will continue keppra 1000 mg in the AM and 1500 mg in the PM; lamictal 25 mg daily  has prn ativan; is followed by neurology   16. Type 2 diabetes mellitus with diabetic neuropathy with long term current use of insulin: hgb a1c 6.7 will continue lantus 15 units nightly novolog 5 units twice daily with meals   17. Hyperlipidemia associated with type 2 diabetes mellitus: LDL 81; will continue crestor 20 mg daily   18. Hypertension associated with type 2 diabetes mellitus: b/p 142/68 will continue hygroton 25 mg daily is off lisinopril due to renal function   [name] NP [facility]   [phone]

## 2022-06-10 DIAGNOSIS — Z1159 Encounter for screening for other viral diseases: Secondary | ICD-10-CM | POA: Diagnosis not present

## 2022-06-10 DIAGNOSIS — G8252 Quadriplegia, C1-C4 incomplete: Secondary | ICD-10-CM | POA: Diagnosis not present

## 2022-06-10 DIAGNOSIS — Z1383 Encounter for screening for respiratory disorder NEC: Secondary | ICD-10-CM | POA: Diagnosis not present

## 2022-06-19 ENCOUNTER — Non-Acute Institutional Stay (SKILLED_NURSING_FACILITY): Payer: Medicare HMO | Admitting: Adult Health

## 2022-06-19 ENCOUNTER — Encounter: Payer: Self-pay | Admitting: Adult Health

## 2022-06-19 DIAGNOSIS — I700 Atherosclerosis of aorta: Secondary | ICD-10-CM | POA: Diagnosis not present

## 2022-06-19 DIAGNOSIS — S14124S Central cord syndrome at C4 level of cervical spinal cord, sequela: Secondary | ICD-10-CM | POA: Diagnosis not present

## 2022-06-19 DIAGNOSIS — E43 Unspecified severe protein-calorie malnutrition: Secondary | ICD-10-CM | POA: Diagnosis not present

## 2022-06-19 NOTE — Progress Notes (Unsigned)
Location:  [facility] Room Number: NO/154/W Place of Service:  SNF (31)   CODE STATUS: DNR  Allergies  Allergen Reactions   Ace Inhibitors Other (See Comments)    Hyperkalemia    Codeine    Sulfa Antibiotics    Chief Complaint  Patient presents with   Acute Visit    Care plan meeting.     HPI:  We have come together for her care plan meeting. BIMS 15/15 mood 7/30: decreased appetite; trouble concentrating; becomes nervous. She is nonambulatory with no falls. She is dependent for all adl care. She has a long term foley due to her central cord syndrome at C4 level. She is incontinent of bowel. Dietary: weight is stable at 166.9 pounds we are changing her diet to regular as she does not like her food choices; her appetite is 75-100% does feed herself. Therapy: none at this time. Activities: is 1:1. She will continue to be followed for her chronic illnesses including:  Aortic atherosclerosis  Central cord syndrome at C4 level of cervical spinal cord sequela  Protein calorie malnutrition severe  Past Medical History:  Diagnosis Date   Adult failure to thrive    Anemia    Anxiety    Atherosclerosis of aorta (HCC)    Central cord syndrome at C4 level of cervical spinal cord, subsequent encounter ([facility])    CKD (chronic kidney disease)    stage 3   Depression    DM type 2 with diabetic peripheral neuropathy (HCC)    Dysphagia    GERD (gastroesophageal reflux disease)    Gout    High cholesterol    HTN (hypertension)    Hyponatremia    MDD (major depressive disorder)    Neck pain    Neuropathy    Quadriplegia, C1-C4 incomplete (HCC)    Radiculopathy    RLS (restless legs syndrome)    Urinary retention     Past Surgical History:  Procedure Laterality Date   ABDOMINAL HYSTERECTOMY     ANTERIOR CERVICAL DECOMP/DISCECTOMY FUSION N/A 02/05/2021   Procedure: Cervical Three-Four  Anterior cervical decompression/discectomy/fusion;  Surgeon: Ashok Pall, MD;   Location: [facility];  Service: Neurosurgery;  Laterality: N/A;  RM 20   APPENDECTOMY     BACK SURGERY     BIOPSY  09/22/2021   Procedure: BIOPSY;  Surgeon: [name], DO;  [facility]: [facility];  Service: Endoscopy;;   CERVICAL DISC SURGERY     ESOPHAGOGASTRODUODENOSCOPY (EGD) WITH PROPOFOL N/A 09/22/2021   Procedure: ESOPHAGOGASTRODUODENOSCOPY (EGD) WITH PROPOFOL;  Surgeon: [name], DO;  Location: [facility];  Service: Endoscopy;  Laterality: N/A;  1:00pm   HAND SURGERY     KNEE SURGERY      Social History   Socioeconomic History   Marital status: Widowed    Spouse name: Not on file   Number of children: Not on file   Years of education: Not on file   Highest education level: Not on file  Occupational History   Not on file  Tobacco Use   Smoking status: Never   Smokeless tobacco: Never  Vaping Use   Vaping Use: Never used  Substance and Sexual Activity   Alcohol use: Never   Drug use: Never   Sexual activity: Never  Other Topics Concern   Not on file  Social History Narrative   Lives at [facility]   Social Determinants of Health   Financial Resource Strain: Not on file  Food Insecurity: Not on file  Transportation Needs: Not on file  Physical Activity: Not on file  Stress: Not on file  Social Connections: Not on file  Intimate Partner Violence: Not on file   Family History  Problem Relation Age of Onset   Cancer Mother    Cardiomyopathy Father    Seizures Sister        childhood   Seizures Brother        in his 51s   Colon cancer Neg Hx       VITAL SIGNS BP (!) 139/56   Pulse 79   Temp (!) 97.3 F (36.3 C)   Resp 20   Ht 5\' 2"  (1.575 m)   Wt 166 lb (75.3 kg)   SpO2 97%   BMI 30.36 kg/m   Outpatient Encounter Medications as of 06/19/2022  Medication Sig   acetaminophen (TYLENOL) 325 MG tablet Take 650 mg by mouth every 8 (eight) hours.   albuterol (VENTOLIN HFA) 108 (90 Base) MCG/ACT inhaler Inhale 1 puff into the  lungs every 4 (four) hours as needed for wheezing or shortness of breath.   AMBULATORY NON FORMULARY MEDICATION Medication Name:  Continue monthly catheter changes with 30fr catheter at skilled nursing facility.   Artificial Saliva (BIOTENE MOISTURIZING MOUTH MT) Use as directed 1 application. in the mouth or throat at bedtime. 9 pm   aspirin EC 81 MG tablet Take 81 mg by mouth daily. Swallow whole.9 am   BENZONATATE PO Take 100 mg by mouth every 8 (eight) hours as needed (cough).   chlorthalidone (HYGROTON) 25 MG tablet Take 25 mg by mouth daily.   colchicine 0.6 MG tablet Take 0.6 mg by mouth daily as needed.   denosumab (PROLIA) 60 MG/ML SOSY injection Inject 60 mg into the skin every 6 (six) months.   DULoxetine (CYMBALTA) 60 MG capsule Take 60 mg by mouth daily.   fexofenadine (ALLEGRA) 180 MG tablet Take 180 mg by mouth daily.   gabapentin (NEURONTIN) 300 MG capsule Take 300 mg by mouth 3 (three) times daily.   guaiFENesin (MUCINEX) 600 MG 12 hr tablet Take by mouth 2 (two) times daily.   insulin aspart (NOVOLOG FLEXPEN) 100 UNIT/ML FlexPen Inject 5 Units into the skin 2 (two) times daily.   Insulin Pen Needle 30G X 5 MM MISC 1 Device by Does not apply route daily. 3/16"   lamoTRIgine (LAMICTAL) 25 MG tablet Take 25 mg by mouth daily. DX: Unspecified convulsions   LANTUS SOLOSTAR 100 UNIT/ML Solostar Pen Inject 15 Units into the skin at bedtime.   levETIRAcetam (KEPPRA) 1000 MG tablet Take 1,000 mg by mouth 2 (two) times daily. In the morning and at Bedtime   levETIRAcetam (KEPPRA) 500 MG tablet Take 500 mg by mouth at bedtime. Along with 1000 mg for a total on 1500 mg in the pm (see other listing)   lidocaine (LIDODERM) 5 % Place onto the skin. Apply to Right shoulder as needed once a Morning. Remove & Discard patch within 12 hours or as directed by MD   Lidocaine-Glycerin (PREPARATION H EX) Apply topically. Maximum Strength (phenyleph-pramoxin-glycr-w.pet) [OTC] cream; 0.25-1 %;  rectal Special Instructions: As needed for rectal itching/hemorrhoids. Once A Day   LORazepam (ATIVAN) 2 MG/ML injection Inject 0.5 mLs (1 mg total) into the vein every 15 (fifteen) minutes as needed.   melatonin 3 MG TABS tablet Take 3 mg by mouth at bedtime.   NON FORMULARY Diet - NAS, Cons CHO   omeprazole (PRILOSEC) 20  MG capsule Take 20 mg by mouth daily.   rOPINIRole (REQUIP) 1 MG tablet Take 1 mg by mouth at bedtime.   rosuvastatin (CRESTOR) 20 MG tablet Take 20 mg by mouth daily.   tiZANidine (ZANAFLEX) 2 MG tablet Take 2 mg by mouth every 6 (six) hours as needed for muscle spasms (for back and sciatic pain).   No facility-administered encounter medications on file as of 06/19/2022.     SIGNIFICANT DIAGNOSTIC EXAMS   PREVIOUS     06-22-21: ct of abdomen and pelvis:  Left colonic diverticulosis.  No active diverticulitis. Bilateral hydronephrosis to the level of the bladder. No obstructing stones. Urinary bladder is distended with Foley catheter in place. Aortic atherosclerosis.  07-06-21: ct head:  No intracranial abnormality or other acute findings. Chronic bilateral maxillary sinus disease.  07-06-21: ct of abdomen and pelvis:  1. Heterogeneous enhancement of the left kidney with mild left perinephric edema, suspicious for pyelonephritis. There is also mild bladder wall thickening and perivesicular fat stranding with Foley catheter in place. Improvement in bilateral hydronephrosis from prior exam. 2. Stool distends the rectum at 7.6 cm with mild distal rectal wall thickening, suspicious for fecal impaction. 3. Colonic diverticulosis without diverticulitis. Aortic Atherosclerosis   07-07-21: MRI of head:  No acute intracranial finding. No sign of acute infarction. No lesion seen to explain seizure. The patient does have mild chronic small-vessel ischemic changes considering age affecting the pons, thalami and cerebral hemispheric white matter. Left mastoid  effusion  09-14-21: ct of head:  No acute intracranial abnormality. 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 New left sphenoid sinus air-fluid level, which may be seen with acute sinusitis.  09-28-21: ct of head No acute intracranial findings are seen in noncontrast CT brain. Chronic sinusitis.  Bilateral mastoid effusions.  04-15-22; dexa: t score -3.391  NO NEW EXAMS       LABS REVIEWED PREVIOUS   06-12-21: uric acid 5.9 06-15-21: wbc 6.0; hgb 9.8; hct 29.4; mcv 90,7 plt 242; glucose 187; bun 65; creat 1.33; k+ 4.2; na++ 131; ca 8.9; GFR 40; liver normal albumin 3.3  06-19-21: wbc 7.3; hgb 9.9; hct 30.1; mcv 89,9 plt 306; glucose 115; bun 29; creat 1.05; k+ 4.1; na++ 131; ca 8.7; GFR 53; liver normal albumin 3.1; urine micro-albumin 85.3 06-22-21; wbc 11.8; hgb 10.4; hct 30.9; mcv 89,3 plt 375; glucose 288; bun 44; creat 1.46; k+ 3.5; na++ 125; ca 8.3 GFR 36; liver normal albumin 3.3; lipase 37  07-06-21: wbc 7.6; hgb 9.7; hct 29.8; mcv 89,2 plt 409; glucose 92; bun 44; creat 1.09; k+ 5,3l na++ 136; ca 9.4; GFR 51; liver normal albumin 3.4  Hgb A1C: 8.3; urine culture: proteus mirabilis; + 30,000 colonies klebsiella pneumoniae  08-29-21: wbc 5.6; hgb 9.0; hct 27.6; mcv 94.2 plt 244; glucose 106; bun 80; creat 1.35; k+ 5.8; na++ 134; ca 9.0; GFR 39 liver normal albumin 3.5 vit B 12: 446; folate 7.7; iron 79 tibc 280; ferritin 178 09-04-21: glucose 66; bun 79; creat 1.44; k+ 5.5; na++ 135; ca 9.2; GFR 36 09-14-21: wbc 6.2; hgb 8.9; hct 27.1; mcv 96.1 plt 217; glucose 171; bun 58; creat 1.69; k+ 5.8; na++ 134; ca 8.7; GFR 30; urine culture: multiple species 09-15-20: k+ 6.3 09-16-21: k+ 5.0  09-28-21: wbc 6.0; hgb 9.2; hct 27.9; mcv 95.2 plt 252; glucose 160; bun 57; creat 1.33; k+ 5.1; na++ 133; ca 8.9; GFR 40; mag 1.8; liver normal albumin 3.8  10-17-21: wbc 5.9; hgb 8.9; hct 26.6; mcv 95.3  plt 223; glucose 171; bun 44; creat 1.31;k+ 4.8; na++ 134; ca 8.6; gfr 40. D-dimer 1.21; crp 3.2  10-27-21: hgb  a1c 6.5 11-04-21: CRp 21.2; d-dimer 1.46 11-24-21: wbc 5.4; hgb 8.7; hct 27.5; mcv 94.2 plt 218; sed rate 80; ana: neg; crp 0.6 01-15-22: hgb a1c 6.7 chol 148; ldl 87; trig 112; hdl 39; tsh 5.663  02-11-22: urine culture: multiple bacteria  04-09-22: liver normal protein 6.0 albumin 3.3; chol 143; ldl 81; trig 71; hdl 48; tsh 3.502  04-27-22; wbc 6.6; hgb 8.7; hct 26.2; mcv 94.6 plt 231; glucose 152; bun 66; creat 1.26; k+ 4.7; na++ 132; ca 8.9; gfr 42; protein 6.7 albumin 3.4 hgb a1c 7.0; vitamin D 37.96  NO NEW LABS.    Review of Systems  Constitutional:  Negative for malaise/fatigue.  Respiratory:  Negative for cough and shortness of breath.   Cardiovascular:  Negative for chest pain, palpitations and leg swelling.  Gastrointestinal:  Negative for abdominal pain, constipation and heartburn.  Musculoskeletal:  Negative for back pain, joint pain and myalgias.  Skin: Negative.   Neurological:  Negative for dizziness.  Psychiatric/Behavioral:  The patient is not nervous/anxious.     Physical Exam Constitutional:      General: She is not in acute distress.    Appearance: She is well-developed. She is not diaphoretic.  Neck:     Thyroid: No thyromegaly.  Cardiovascular:     Rate and Rhythm: Normal rate and regular rhythm.     Pulses: Normal pulses.     Heart sounds: Normal heart sounds.  Pulmonary:     Effort: Pulmonary effort is normal. No respiratory distress.     Breath sounds: Normal breath sounds.  Abdominal:     General: Bowel sounds are normal. There is no distension.     Palpations: Abdomen is soft.     Tenderness: There is no abdominal tenderness.  Musculoskeletal:     Cervical back: Neck supple.     Right lower leg: No edema.     Left lower leg: No edema.     Comments: Does not move legs; able to move upper extremities   left upper extremity is weak       Lymphadenopathy:     Cervical: No cervical adenopathy.  Skin:    General: Skin is warm and dry.  Neurological:      Mental Status: She is alert and oriented to person, place, and time.  Psychiatric:        Mood and Affect: Mood normal.     ASSESSMENT/ PLAN:  TODAY  Aortic atherosclerosis Central cord syndrome at C4 level of cervical spinal cord sequela Protein calorie malnutrition severe   Will continue current medications Will continue current plan of care Will continue to monitor her status.   Time spent with patient: 40 minutes: medication; plan of care dietary    [name] NP [facility]   call [phone]

## 2022-06-24 NOTE — Progress Notes (Addendum)
Location:  [place] Room Number: NO/154/W Place of Service:  SNF (31)     CODE STATUS: DNR        Allergies  Allergen Reactions   Ace Inhibitors Other (See Comments)      Hyperkalemia    Codeine     Sulfa Antibiotics          Chief Complaint  Patient presents with   Acute Visit      Care plan meeting.       HPI:   We have come together for her care plan meeting. BIMS 15/15 mood 7/30: decreased appetite; trouble concentrating; becomes nervous. She is nonambulatory with no falls. She is dependent for all adl care. She has a long term foley due to her central cord syndrome at C4 level. She is incontinent of bowel. Dietary: weight is stable at 166.9 pounds we are changing her diet to regular as she does not like her food choices; her appetite is 75-100% does feed herself. Therapy: none at this time. Activities: is 1:1. She will continue to be followed for her chronic illnesses including:  Aortic atherosclerosis  Central cord syndrome at C4 level of cervical spinal cord sequela  Protein calorie malnutrition severe       Past Medical History:  Diagnosis Date   Adult failure to thrive     Anemia     Anxiety     Atherosclerosis of aorta (HCC)     Central cord syndrome at C4 level of cervical spinal cord, subsequent encounter ([place])     CKD (chronic kidney disease)      stage 3   Depression     DM type 2 with diabetic peripheral neuropathy (HCC)     Dysphagia     GERD (gastroesophageal reflux disease)     Gout     High cholesterol     HTN (hypertension)     Hyponatremia     MDD (major depressive disorder)     Neck pain     Neuropathy     Quadriplegia, C1-C4 incomplete (HCC)     Radiculopathy     RLS (restless legs syndrome)     Urinary retention             Past Surgical History:  Procedure Laterality Date   ABDOMINAL HYSTERECTOMY       ANTERIOR CERVICAL DECOMP/DISCECTOMY FUSION N/A 02/05/2021    Procedure: Cervical Three-Four  Anterior  cervical decompression/discectomy/fusion;  Surgeon: Ashok Pall, MD;  Location: [facility];  Service: Neurosurgery;  Laterality: N/A;  RM 20   APPENDECTOMY       BACK SURGERY       BIOPSY   09/22/2021    Procedure: BIOPSY;  Surgeon: [name], DO;  [facility];  Service: Endoscopy;;   CERVICAL DISC SURGERY       ESOPHAGOGASTRODUODENOSCOPY (EGD) WITH PROPOFOL N/A 09/22/2021    Procedure: ESOPHAGOGASTRODUODENOSCOPY (EGD) WITH PROPOFOL;  Surgeon: [name], DO;  [facility];  Service: Endoscopy;  Laterality: N/A;  1:00pm   HAND SURGERY       KNEE SURGERY          Social History         Socioeconomic History   Marital status: Widowed      Spouse name: Not on file   Number of children: Not on file   Years of education: Not on file   Highest education level: Not on  file  Occupational History   Not on file  Tobacco Use   Smoking status: Never   Smokeless tobacco: Never  Vaping Use   Vaping Use: Never used  Substance and Sexual Activity   Alcohol use: Never   Drug use: Never   Sexual activity: Never  Other Topics Concern   Not on file  Social History Narrative    Lives at [facility] Strain: Not on file  Food Insecurity: Not on file  Transportation Needs: Not on file  Physical Activity: Not on file  Stress: Not on file  Social Connections: Not on file  Intimate Partner Violence: Not on file         Family History  Problem Relation Age of Onset   Cancer Mother     Cardiomyopathy Father     Seizures Sister          childhood   Seizures Brother          in his 97s   Colon cancer Neg Hx            VITAL SIGNS BP (!) 139/56   Pulse 79   Temp (!) 97.3 F (36.3 C)   Resp 20   Ht 5\' 2"  (1.575 m)   Wt 166 lb (75.3 kg)   SpO2 97%   BMI 30.36 kg/m        Outpatient Encounter Medications as of 06/19/2022  Medication Sig   acetaminophen (TYLENOL) 325 MG tablet Take  650 mg by mouth every 8 (eight) hours.   albuterol (VENTOLIN HFA) 108 (90 Base) MCG/ACT inhaler Inhale 1 puff into the lungs every 4 (four) hours as needed for wheezing or shortness of breath.   AMBULATORY NON FORMULARY MEDICATION Medication Name:  Continue monthly catheter changes with 35fr catheter at skilled nursing facility.   Artificial Saliva (BIOTENE MOISTURIZING MOUTH MT) Use as directed 1 application. in the mouth or throat at bedtime. 9 pm   aspirin EC 81 MG tablet Take 81 mg by mouth daily. Swallow whole.9 am   BENZONATATE PO Take 100 mg by mouth every 8 (eight) hours as needed (cough).   chlorthalidone (HYGROTON) 25 MG tablet Take 25 mg by mouth daily.   colchicine 0.6 MG tablet Take 0.6 mg by mouth daily as needed.   denosumab (PROLIA) 60 MG/ML SOSY injection Inject 60 mg into the skin every 6 (six) months.   DULoxetine (CYMBALTA) 60 MG capsule Take 60 mg by mouth daily.   fexofenadine (ALLEGRA) 180 MG tablet Take 180 mg by mouth daily.   gabapentin (NEURONTIN) 300 MG capsule Take 300 mg by mouth 3 (three) times daily.   guaiFENesin (MUCINEX) 600 MG 12 hr tablet Take by mouth 2 (two) times daily.   insulin aspart (NOVOLOG FLEXPEN) 100 UNIT/ML FlexPen Inject 5 Units into the skin 2 (two) times daily.   Insulin Pen Needle 30G X 5 MM MISC 1 Device by Does not apply route daily. 3/16"   lamoTRIgine (LAMICTAL) 25 MG tablet Take 25 mg by mouth daily. DX: Unspecified convulsions   LANTUS SOLOSTAR 100 UNIT/ML Solostar Pen Inject 15 Units into the skin at bedtime.   levETIRAcetam (KEPPRA) 1000 MG tablet Take 1,000 mg by mouth 2 (two) times daily. In the morning and at Bedtime   levETIRAcetam (KEPPRA) 500 MG tablet Take 500 mg by mouth at bedtime. Along with 1000 mg for a total on 1500 mg in the pm (  see other listing)   lidocaine (LIDODERM) 5 % Place onto the skin. Apply to Right shoulder as needed once a Morning. Remove & Discard patch within 12 hours or as directed by MD    Lidocaine-Glycerin (PREPARATION H EX) Apply topically. Maximum Strength (phenyleph-pramoxin-glycr-w.pet) [OTC] cream; 0.25-1 %; rectal Special Instructions: As needed for rectal itching/hemorrhoids. Once A Day   LORazepam (ATIVAN) 2 MG/ML injection Inject 0.5 mLs (1 mg total) into the vein every 15 (fifteen) minutes as needed.   melatonin 3 MG TABS tablet Take 3 mg by mouth at bedtime.   NON FORMULARY Diet - NAS, Cons CHO   omeprazole (PRILOSEC) 20 MG capsule Take 20 mg by mouth daily.   rOPINIRole (REQUIP) 1 MG tablet Take 1 mg by mouth at bedtime.   rosuvastatin (CRESTOR) 20 MG tablet Take 20 mg by mouth daily.   tiZANidine (ZANAFLEX) 2 MG tablet Take 2 mg by mouth every 6 (six) hours as needed for muscle spasms (for back and sciatic pain).    No facility-administered encounter medications on file as of 06/19/2022.        SIGNIFICANT DIAGNOSTIC EXAMS     PREVIOUS      06-22-21: ct of abdomen and pelvis:  Left colonic diverticulosis.  No active diverticulitis. Bilateral hydronephrosis to the level of the bladder. No obstructing stones. Urinary bladder is distended with Foley catheter in place. Aortic atherosclerosis.   07-06-21: ct head:  No intracranial abnormality or other acute findings. Chronic bilateral maxillary sinus disease.   07-06-21: ct of abdomen and pelvis:  1. Heterogeneous enhancement of the left kidney with mild left perinephric edema, suspicious for pyelonephritis. There is also mild bladder wall thickening and perivesicular fat stranding with Foley catheter in place. Improvement in bilateral hydronephrosis from prior exam. 2. Stool distends the rectum at 7.6 cm with mild distal rectal wall thickening, suspicious for fecal impaction. 3. Colonic diverticulosis without diverticulitis. Aortic Atherosclerosis    07-07-21: MRI of head:  No acute intracranial finding. No sign of acute infarction. No lesion seen to explain seizure. The patient does have mild chronic  small-vessel ischemic changes considering age affecting the pons, thalami and cerebral hemispheric white matter. Left mastoid effusion   09-14-21: ct of head:  No acute intracranial abnormality. 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 New left sphenoid sinus air-fluid level, which may be seen with acute sinusitis.   09-28-21: ct of head No acute intracranial findings are seen in noncontrast CT brain. Chronic sinusitis.  Bilateral mastoid effusions.   04-15-22; dexa: t score -3.391   NO NEW EXAMS         LABS REVIEWED PREVIOUS    06-12-21: uric acid 5.9 06-15-21: wbc 6.0; hgb 9.8; hct 29.4; mcv 90,7 plt 242; glucose 187; bun 65; creat 1.33; k+ 4.2; na++ 131; ca 8.9; GFR 40; liver normal albumin 3.3  06-19-21: wbc 7.3; hgb 9.9; hct 30.1; mcv 89,9 plt 306; glucose 115; bun 29; creat 1.05; k+ 4.1; na++ 131; ca 8.7; GFR 53; liver normal albumin 3.1; urine micro-albumin 85.3 06-22-21; wbc 11.8; hgb 10.4; hct 30.9; mcv 89,3 plt 375; glucose 288; bun 44; creat 1.46; k+ 3.5; na++ 125; ca 8.3 GFR 36; liver normal albumin 3.3; lipase 37  07-06-21: wbc 7.6; hgb 9.7; hct 29.8; mcv 89,2 plt 409; glucose 92; bun 44; creat 1.09; k+ 5,3l na++ 136; ca 9.4; GFR 51; liver normal albumin 3.4  Hgb A1C: 8.3; urine culture: proteus mirabilis; + 30,000 colonies klebsiella pneumoniae  08-29-21: wbc 5.6; hgb 9.0;  hct 27.6; mcv 94.2 plt 244; glucose 106; bun 80; creat 1.35; k+ 5.8; na++ 134; ca 9.0; GFR 39 liver normal albumin 3.5 vit B 12: 446; folate 7.7; iron 79 tibc 280; ferritin 178 09-04-21: glucose 66; bun 79; creat 1.44; k+ 5.5; na++ 135; ca 9.2; GFR 36 09-14-21: wbc 6.2; hgb 8.9; hct 27.1; mcv 96.1 plt 217; glucose 171; bun 58; creat 1.69; k+ 5.8; na++ 134; ca 8.7; GFR 30; urine culture: multiple species 09-15-20: k+ 6.3 09-16-21: k+ 5.0  09-28-21: wbc 6.0; hgb 9.2; hct 27.9; mcv 95.2 plt 252; glucose 160; bun 57; creat 1.33; k+ 5.1; na++ 133; ca 8.9; GFR 40; mag 1.8; liver normal albumin 3.8  10-17-21: wbc 5.9; hgb  8.9; hct 26.6; mcv 95.3 plt 223; glucose 171; bun 44; creat 1.31;k+ 4.8; na++ 134; ca 8.6; gfr 40. D-dimer 1.21; crp 3.2  10-27-21: hgb a1c 6.5 11-04-21: CRp 21.2; d-dimer 1.46 11-24-21: wbc 5.4; hgb 8.7; hct 27.5; mcv 94.2 plt 218; sed rate 80; ana: neg; crp 0.6 01-15-22: hgb a1c 6.7 chol 148; ldl 87; trig 112; hdl 39; tsh 5.663  02-11-22: urine culture: multiple bacteria  04-09-22: liver normal protein 6.0 albumin 3.3; chol 143; ldl 81; trig 71; hdl 48; tsh 3.502  04-27-22; wbc 6.6; hgb 8.7; hct 26.2; mcv 94.6 plt 231; glucose 152; bun 66; creat 1.26; k+ 4.7; na++ 132; ca 8.9; gfr 42; protein 6.7 albumin 3.4 hgb a1c 7.0; vitamin D 37.96   NO NEW LABS.     Review of Systems  Constitutional:  Negative for malaise/fatigue.  Respiratory:  Negative for cough and shortness of breath.   Cardiovascular:  Negative for chest pain, palpitations and leg swelling.  Gastrointestinal:  Negative for abdominal pain, constipation and heartburn.  Musculoskeletal:  Negative for back pain, joint pain and myalgias.  Skin: Negative.   Neurological:  Negative for dizziness.  Psychiatric/Behavioral:  The patient is not nervous/anxious.       Physical Exam Constitutional:      General: She is not in acute distress.    Appearance: She is well-developed. She is not diaphoretic.  Neck:     Thyroid: No thyromegaly.  Cardiovascular:     Rate and Rhythm: Normal rate and regular rhythm.     Pulses: Normal pulses.     Heart sounds: Normal heart sounds.  Pulmonary:     Effort: Pulmonary effort is normal. No respiratory distress.     Breath sounds: Normal breath sounds.  Abdominal:     General: Bowel sounds are normal. There is no distension.     Palpations: Abdomen is soft.     Tenderness: There is no abdominal tenderness.  Musculoskeletal:     Cervical back: Neck supple.     Right lower leg: No edema.     Left lower leg: No edema.     Comments: Does not move legs; able to move upper extremities   left upper  extremity is weak       Lymphadenopathy:     Cervical: No cervical adenopathy.  Skin:    General: Skin is warm and dry.  Neurological:     Mental Status: She is alert and oriented to person, place, and time.  Psychiatric:        Mood and Affect: Mood normal.        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    Time spent with patient: 40 minutes: medication; plan of care dietary      [name] NP [facility]   call [phone]

## 2022-07-09 ENCOUNTER — Other Ambulatory Visit (HOSPITAL_COMMUNITY)
Admission: RE | Admit: 2022-07-09 | Discharge: 2022-07-09 | Disposition: A | Discharge status: Home / Self Care | Payer: Medicare HMO | Source: Skilled Nursing Facility | Attending: Adult Health | Admitting: Adult Health

## 2022-07-09 ENCOUNTER — Non-Acute Institutional Stay (SKILLED_NURSING_FACILITY): Payer: Medicare HMO | Admitting: Adult Health

## 2022-07-09 DIAGNOSIS — I129 Hypertensive chronic kidney disease with stage 1 through stage 4 chronic kidney disease, or unspecified chronic kidney disease: Secondary | ICD-10-CM | POA: Diagnosis not present

## 2022-07-09 DIAGNOSIS — N1831 Chronic kidney disease, stage 3a: Secondary | ICD-10-CM

## 2022-07-09 DIAGNOSIS — E1122 Type 2 diabetes mellitus with diabetic chronic kidney disease: Secondary | ICD-10-CM

## 2022-07-09 LAB — CBC WITH DIFFERENTIAL/PLATELET
Abs Immature Granulocytes: 0.03 10*3/uL (ref 0.00–0.07)
Basophils Absolute: 0 10*3/uL (ref 0.0–0.1)
Basophils Relative: 0 %
Eosinophils Absolute: 0.5 10*3/uL (ref 0.0–0.5)
Eosinophils Relative: 6 %
HCT: 26.9 % — ABNORMAL LOW (ref 36.0–46.0)
Hemoglobin: 8.5 g/dL — ABNORMAL LOW (ref 12.0–15.0)
Immature Granulocytes: 0 %
Lymphocytes Relative: 24 %
Lymphs Abs: 2.2 10*3/uL (ref 0.7–4.0)
MCH: 30.6 pg (ref 26.0–34.0)
MCHC: 31.6 g/dL (ref 30.0–36.0)
MCV: 96.8 fL (ref 80.0–100.0)
Monocytes Absolute: 0.6 10*3/uL (ref 0.1–1.0)
Monocytes Relative: 7 %
Neutro Abs: 5.9 10*3/uL (ref 1.7–7.7)
Neutrophils Relative %: 63 %
Platelets: 196 10*3/uL (ref 150–400)
RBC: 2.78 MIL/uL — ABNORMAL LOW (ref 3.87–5.11)
RDW: 14 % (ref 11.5–15.5)
WBC: 9.3 10*3/uL (ref 4.0–10.5)
nRBC: 0 % (ref 0.0–0.2)

## 2022-07-09 LAB — BASIC METABOLIC PANEL
Anion gap: 7 (ref 5–15)
BUN: 65 mg/dL — ABNORMAL HIGH (ref 8–23)
CO2: 16 mmol/L — ABNORMAL LOW (ref 22–32)
Calcium: 7.8 mg/dL — ABNORMAL LOW (ref 8.9–10.3)
Chloride: 105 mmol/L (ref 98–111)
Creatinine, Ser: 2.15 mg/dL — ABNORMAL HIGH (ref 0.44–1.00)
GFR, Estimated: 22 mL/min — ABNORMAL LOW (ref >60)
Glucose, Bld: 200 mg/dL — ABNORMAL HIGH (ref 70–99)
Potassium: 5 mmol/L (ref 3.5–5.1)
Sodium: 128 mmol/L — ABNORMAL LOW (ref 135–145)

## 2022-07-10 ENCOUNTER — Other Ambulatory Visit (HOSPITAL_COMMUNITY)
Admission: RE | Admit: 2022-07-10 | Discharge: 2022-07-10 | Disposition: A | Discharge status: Home / Self Care | Payer: Medicare HMO | Source: Skilled Nursing Facility | Attending: Adult Health | Admitting: Adult Health

## 2022-07-10 ENCOUNTER — Encounter: Payer: Self-pay | Admitting: Adult Health

## 2022-07-10 DIAGNOSIS — I129 Hypertensive chronic kidney disease with stage 1 through stage 4 chronic kidney disease, or unspecified chronic kidney disease: Secondary | ICD-10-CM | POA: Insufficient documentation

## 2022-07-10 LAB — BASIC METABOLIC PANEL
Anion gap: 7 (ref 5–15)
BUN: 59 mg/dL — ABNORMAL HIGH (ref 8–23)
CO2: 15 mmol/L — ABNORMAL LOW (ref 22–32)
Calcium: 7.6 mg/dL — ABNORMAL LOW (ref 8.9–10.3)
Chloride: 109 mmol/L (ref 98–111)
Creatinine, Ser: 1.75 mg/dL — ABNORMAL HIGH (ref 0.44–1.00)
GFR, Estimated: 29 mL/min — ABNORMAL LOW (ref >60)
Glucose, Bld: 130 mg/dL — ABNORMAL HIGH (ref 70–99)
Potassium: 4.2 mmol/L (ref 3.5–5.1)
Sodium: 131 mmol/L — ABNORMAL LOW (ref 135–145)

## 2022-07-10 NOTE — Progress Notes (Unsigned)
Location:  [place] Room Number: NO/154/W Place of Service:  SNF (31)  Crandall Harvel S.,NP  CODE STATUS: DNR  Allergies  Allergen Reactions   Ace Inhibitors Other (See Comments)    Hyperkalemia    Codeine    Sulfa Antibiotics     Chief Complaint  Patient presents with   Acute Visit    Patient is being seen for decreased blood pressure    HPI:  [name] has been having low blood pressure readings in the 80's/50's. [name] denies any chest pain or shortness of breath. [name] does have occasional vertigo; which is long term in nature. [name] is presently taking chlorthalidone 25 mg daily.    Past Medical History:  Diagnosis Date   Adult failure to thrive    Anemia    Anxiety    Atherosclerosis of aorta (HCC)    Central cord syndrome at C4 level of cervical spinal cord, subsequent encounter ([place])    CKD (chronic kidney disease)    stage 3   Depression    DM type 2 with diabetic peripheral neuropathy (HCC)    Dysphagia    GERD (gastroesophageal reflux disease)    Gout    High cholesterol    HTN (hypertension)    Hyponatremia    MDD (major depressive disorder)    Neck pain    Neuropathy    Quadriplegia, C1-C4 incomplete (HCC)    Radiculopathy    RLS (restless legs syndrome)    Urinary retention     Past Surgical History:  Procedure Laterality Date   ABDOMINAL HYSTERECTOMY     ANTERIOR CERVICAL DECOMP/DISCECTOMY FUSION N/A 02/05/2021   Procedure: Cervical Three-Four  Anterior cervical decompression/discectomy/fusion;  Surgeon: Ashok Pall, MD;  Location: [facility] [place];  Service: Neurosurgery;  Laterality: N/A;  RM 20   APPENDECTOMY     BACK SURGERY     BIOPSY  09/22/2021   Procedure: BIOPSY;  Surgeon: [name], DO;  [facility];  Service: Endoscopy;;   CERVICAL DISC SURGERY     ESOPHAGOGASTRODUODENOSCOPY (EGD) WITH PROPOFOL N/A 09/22/2021   Procedure: ESOPHAGOGASTRODUODENOSCOPY (EGD) WITH PROPOFOL;  Surgeon: [name], DO;   [facility];  Service: Endoscopy;  Laterality: N/A;  1:00pm   HAND SURGERY     KNEE SURGERY      Social History   Socioeconomic History   Marital status: Widowed    Spouse name: Not on file   Number of children: Not on file   Years of education: Not on file   Highest education level: Not on file  Occupational History   Not on file  Tobacco Use   Smoking status: Never   Smokeless tobacco: Never  Vaping Use   Vaping Use: Never used  Substance and Sexual Activity   Alcohol use: Never   Drug use: Never   Sexual activity: Never  Other Topics Concern   Not on file  Social History Narrative   Lives at [facility]   Social Determinants of Health   Financial Resource Strain: Not on file  Food Insecurity: Not on file  Transportation Needs: Not on file  Physical Activity: Not on file  Stress: Not on file  Social Connections: Not on file  Intimate Partner Violence: Not on file   Family History  Problem Relation Age of Onset   Cancer Mother    Cardiomyopathy Father    Seizures Sister        childhood   Seizures Brother  in his 34s   Colon cancer Neg Hx       VITAL SIGNS BP (!) 89/52   Pulse 97   Temp 97.7 F (36.5 C)   Resp 20   Ht 5\' 2"  (1.575 m)   Wt 167 lb (75.8 kg)   SpO2 97%   BMI 30.54 kg/m   Outpatient Encounter Medications as of 07/09/2022  Medication Sig   acetaminophen (TYLENOL) 325 MG tablet Take 650 mg by mouth every 8 (eight) hours.   albuterol (VENTOLIN HFA) 108 (90 Base) MCG/ACT inhaler Inhale 1 puff into the lungs every 4 (four) hours as needed for wheezing or shortness of breath.   AMBULATORY NON FORMULARY MEDICATION Medication Name:  Continue monthly catheter changes with 92fr catheter at skilled nursing facility.   Artificial Saliva (BIOTENE MOISTURIZING MOUTH MT) Use as directed 1 application. in the mouth or throat at bedtime. 9 pm   aspirin EC 81 MG tablet Take 81 mg by mouth daily. Swallow whole.9 am    denosumab (PROLIA) 60 MG/ML SOSY injection Inject 60 mg into the skin every 6 (six) months.   DULoxetine (CYMBALTA) 60 MG capsule Take 60 mg by mouth daily.   fexofenadine (ALLEGRA) 180 MG tablet Take 180 mg by mouth daily.   gabapentin (NEURONTIN) 300 MG capsule Take 300 mg by mouth 3 (three) times daily.   guaiFENesin (MUCINEX) 600 MG 12 hr tablet Take by mouth 2 (two) times daily.   insulin aspart (NOVOLOG FLEXPEN) 100 UNIT/ML FlexPen Inject 5 Units into the skin 2 (two) times daily.   Insulin Pen Needle 30G X 5 MM MISC 1 Device by Does not apply route daily. 3/16"   lamoTRIgine (LAMICTAL) 25 MG tablet Take 25 mg by mouth daily. DX: Unspecified convulsions   LANTUS SOLOSTAR 100 UNIT/ML Solostar Pen Inject 15 Units into the skin at bedtime.   levETIRAcetam (KEPPRA) 1000 MG tablet Take 1,000 mg by mouth 2 (two) times daily. In the morning and at Bedtime   levETIRAcetam (KEPPRA) 500 MG tablet Take 500 mg by mouth at bedtime. Along with 1000 mg for a total on 1500 mg in the pm (see other listing)   lidocaine (LIDODERM) 5 % Place onto the skin. Apply to Right shoulder as needed once a Morning. Remove & Discard patch within 12 hours or as directed by MD   Lidocaine-Glycerin (PREPARATION H EX) Apply topically. Maximum Strength (phenyleph-pramoxin-glycr-w.pet) [OTC] cream; 0.25-1 %; rectal Special Instructions: As needed for rectal itching/hemorrhoids. Once A Day   LORazepam (ATIVAN) 2 MG/ML injection Inject 0.5 mLs (1 mg total) into the vein every 15 (fifteen) minutes as needed.   melatonin 3 MG TABS tablet Take 3 mg by mouth at bedtime.   NON FORMULARY Diet - NAS, Cons CHO   omeprazole (PRILOSEC) 20 MG capsule Take 20 mg by mouth daily.   rOPINIRole (REQUIP) 1 MG tablet Take 1 mg by mouth at bedtime.   rosuvastatin (CRESTOR) 20 MG tablet Take 20 mg by mouth daily.   tiZANidine (ZANAFLEX) 2 MG tablet Take 2 mg by mouth every 6 (six) hours as needed for muscle spasms (for back and sciatic pain).    BENZONATATE PO Take 100 mg by mouth every 8 (eight) hours as needed (cough).   chlorthalidone (HYGROTON) 25 MG tablet Take 25 mg by mouth daily.   colchicine 0.6 MG tablet Take 0.6 mg by mouth daily as needed.   No facility-administered encounter medications on file as of 07/09/2022.     SIGNIFICANT DIAGNOSTIC EXAMS  PREVIOUS    07-06-21: ct head:  No intracranial abnormality or other acute findings. Chronic bilateral maxillary sinus disease.   07-06-21: ct of abdomen and pelvis:  1. Heterogeneous enhancement of the left kidney with mild left perinephric edema, suspicious for pyelonephritis. There is also mild bladder wall thickening and perivesicular fat stranding with Foley catheter in place. Improvement in bilateral hydronephrosis from prior exam. 2. Stool distends the rectum at 7.6 cm with mild distal rectal wall thickening, suspicious for fecal impaction. 3. Colonic diverticulosis without diverticulitis. Aortic Atherosclerosis    07-07-21: MRI of head:  No acute intracranial finding. No sign of acute infarction. No lesion seen to explain seizure. The patient does have mild chronic small-vessel ischemic changes considering age affecting the pons, thalami and cerebral hemispheric white matter. Left mastoid effusion   09-14-21: ct of head:  No acute intracranial abnormality. 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 New left sphenoid sinus air-fluid level, which may be seen with acute sinusitis.   09-28-21: ct of head No acute intracranial findings are seen in noncontrast CT brain. Chronic sinusitis.  Bilateral mastoid effusions.   04-15-22; dexa: t score -3.391   NO NEW EXAMS         LABS REVIEWED PREVIOUS    07-06-21: wbc 7.6; hgb 9.7; hct 29.8; mcv 89,2 plt 409; glucose 92; bun 44; creat 1.09; k+ 5,3l na++ 136; ca 9.4; GFR 51; liver normal albumin 3.4  Hgb A1C: 8.3; urine culture: proteus mirabilis; + 30,000 colonies klebsiella pneumoniae  08-29-21: wbc 5.6; hgb 9.0; hct 27.6;  mcv 94.2 plt 244; glucose 106; bun 80; creat 1.35; k+ 5.8; na++ 134; ca 9.0; GFR 39 liver normal albumin 3.5 vit B 12: 446; folate 7.7; iron 79 tibc 280; ferritin 178 09-04-21: glucose 66; bun 79; creat 1.44; k+ 5.5; na++ 135; ca 9.2; GFR 36 09-14-21: wbc 6.2; hgb 8.9; hct 27.1; mcv 96.1 plt 217; glucose 171; bun 58; creat 1.69; k+ 5.8; na++ 134; ca 8.7; GFR 30; urine culture: multiple species 09-15-20: k+ 6.3 09-16-21: k+ 5.0  09-28-21: wbc 6.0; hgb 9.2; hct 27.9; mcv 95.2 plt 252; glucose 160; bun 57; creat 1.33; k+ 5.1; na++ 133; ca 8.9; GFR 40; mag 1.8; liver normal albumin 3.8  10-17-21: wbc 5.9; hgb 8.9; hct 26.6; mcv 95.3 plt 223; glucose 171; bun 44; creat 1.31;k+ 4.8; na++ 134; ca 8.6; gfr 40. D-dimer 1.21; crp 3.2  10-27-21: hgb a1c 6.5 11-04-21: CRp 21.2; d-dimer 1.46 11-24-21: wbc 5.4; hgb 8.7; hct 27.5; mcv 94.2 plt 218; sed rate 80; ana: neg; crp 0.6 01-15-22: hgb a1c 6.7 chol 148; ldl 87; trig 112; hdl 39; tsh 5.663  02-11-22: urine culture: multiple bacteria  04-09-22: liver normal protein 6.0 albumin 3.3; chol 143; ldl 81; trig 71; hdl 48; tsh 3.502  04-27-22; wbc 6.6; hgb 8.7; hct 26.2; mcv 94.6 plt 231; glucose 152; bun 66; creat 1.26; k+ 4.7; na++ 132; ca 8.9; gfr 42; protein 6.7 albumin 3.4 hgb a1c 7.0; vitamin D 37.96   NO NEW LABS.    Review of Systems  Constitutional:  Negative for malaise/fatigue.  Respiratory:  Negative for cough and shortness of breath.   Cardiovascular:  Negative for chest pain, palpitations and leg swelling.  Gastrointestinal:  Negative for abdominal pain, constipation and heartburn.  Musculoskeletal:  Negative for back pain, joint pain and myalgias.  Skin: Negative.   Neurological:  Negative for dizziness.  Psychiatric/Behavioral:  The patient is not nervous/anxious.    Physical Exam Constitutional:  General: [name] is not in acute distress.    Appearance: [name] is well-developed. [name] is not diaphoretic.  Neck:     Thyroid: No thyromegaly.   Cardiovascular:     Rate and Rhythm: Normal rate and regular rhythm.     Pulses: Normal pulses.     Heart sounds: Normal heart sounds.  Pulmonary:     Effort: Pulmonary effort is normal. No respiratory distress.     Breath sounds: Normal breath sounds.  Abdominal:     General: Bowel sounds are normal. There is no distension.     Palpations: Abdomen is soft.     Tenderness: There is no abdominal tenderness.  Musculoskeletal:     Cervical back: Neck supple.     Right lower leg: No edema.     Left lower leg: No edema.     Comments: Does not move legs; able to move upper extremities   left upper extremity is weak       Lymphadenopathy:     Cervical: No cervical adenopathy.  Skin:    General: Skin is warm and dry.  Neurological:     Mental Status: [name] is alert and oriented to person, place, and time.  Psychiatric:        Mood and Affect: Mood normal.      ASSESSMENT/ PLAN:  TODAY  Hypertension with chronic kidney disease with stage 3a chronic kidney disease due to  type 2 diabetes mellitus: will stop chlorthalidone and will monitor her status.    [name] NP [facility]  call [phone]

## 2022-07-15 NOTE — Progress Notes (Signed)
[facility] [address] [place]. [place] [address] [phone]       OFFICE FOLLOW UP NOTE  [name] Date of Birth:  October 16, 1935 Medical Record Number:  [id number]    Primary neurologist: Dr. Frances Furbish Reason for visit: Seizure disorder    SUBJECTIVE:   CHIEF COMPLAINT:  Chief Complaint  Patient presents with   Follow-up    Pt with daughter, rm 2. Pt following up. Denies any seizures. Overall stable and no concerns. Coming from [facility]    HPI:   [name] is a 86 y.o. female who is being followed in this office by Dr. Frances Furbish for seizure disorder since her teenage years.  She is a permanent resident of [facility] with diagnoses of adult failure to thrive, chronic anemia, arthrosclerosis, cervical cord syndrome with quadriplegia, DM with stage III CKD, HLD, HTN and depression   Update 07/16/2022 JM: Patient returns for seizure follow-up visit after prior visit 7 months ago accompanied by her daughter, [name].  She continues to reside at [facility] SNF.  Denies any seizure activity since prior visit.  Remains on Keppra 1000/1500 and lamotrigine 25 mg daily.  She is very eager to try to come off of some of her medications as she feels she takes too many.  Routine lab work at [facility].  No new concerns at this time.      History provided for reference purposes only Update 01/07/2022 JM: Returns for 81-month follow-up visit accompanied by her daughter [name].  She continues to reside at [facility]. Per daughter, has had at least 4 seizures since prior visit.  Per EMR review, SNF noted seizure activity on 2/25 requiring dose of IM Ativan.  No specific triggers identified. SNF continued Keppra 1000 mg AM and 1500 mg PM and increased lamotrigine to 25 mg daily (previously 12.5 mg daily).  Per daughter, additional seizures since that time with most recent last month.  Per SNF med list, she has continued on lamotrigine 25  mg daily and Keppra 1000/1500, denies side effects. She does not wish to further make adjust to any medications as she feels as though she is already taking too many. Completed EEG 2/16 with no epileptiform discharges seen or any other abnormalities.  No further concerns at this time.   History from Dr. Frances Furbish 2 medications consult visit on 10/07/2021 copied for reference purposes only [name] is an 86 year old right-handed woman with an underlying complex medical history of chronic kidney disease, central cord syndrome diabetes, gout, reflux disease, hypertension, hyperlipidemia, hyponatremia, depression, anemia, overweight state, anxiety, status post multiple surgeries including neck surgery, low back surgery, knee surgery, hand surgery, hysterectomy, appendectomy, who reports a longstanding history of seizures.  She also reports a family history of seizures affecting her sister who had seizures starting at age 72 years old.  Patient reports that she was on Dilantin in the past.  She had recurrence of seizures approximately last year as I understand. I reviewed your office note from 07/28/2021, at which time she saw Benita Gutter, NP.  Of note, she is on multiple medications including lorazepam as needed, atorvastatin, Biotene as needed, chlorthalidone, colchicine, Cymbalta, gabapentin, Humalog, Lantus, melatonin, mirtazapine, omeprazole, Reglan, ropinirole, tizanidine as needed.  She is on Ventolin as needed. Per daughter, she does not have any tonic-clonic seizures, her seizures consist of involuntary mouth movements, decreased consciousness, or loss of awareness, lipsmacking, and drawing of her arms upwards above her head.  She is wheelchair-bound and since last year has not been able to walk.  She has an indwelling catheter.  Per daughter, she hydrates well with water.  She has limited caffeine, 1 cup of coffee in the mornings typically.  She does not drink any alcohol.  She resides at [facility],  skilled nursing facility.   I had evaluated her over 2 years ago at the request of Dr. Jenne Pane in ENT for her balance problems and gait disorder.  She has since then missed 2 appointments.  She has been seen by Dr. Gerilyn Pilgrim in the interim in 2021 but office notes were not available for my review.  She was recently seen in the emergency room on 09/28/2021 with seizures.  She presented from the [facility].  She received multiple doses of Ativan.  I reviewed the emergency room records.  She had a head CT without contrast on 09/28/2021 and I reviewed the results: Impression: No acute intracranial findings are seen in noncontrast CT brain.  Chronic sinusitis.  Bilateral mastoid effusions.   She had a head CT without contrast on 09/14/2021 with the indication of seizure today, altered mental status.  I reviewed the results: Impression: No acute intracranial abnormality.  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  New left sphenoid sinus air-fluid level, which may be seen with acute sinusitis.  She had a brain MRI with and without contrast on 07/07/2021 with indication of possible seizure.  Abnormal neurological exam.  I reviewed the results: Impression: No acute intracranial finding. No sign of acute infarction. No lesion seen to explain seizure. The patient does have mild chronic small-vessel ischemic changes considering age affecting the pons, thalami and cerebral hemispheric white matter. Left mastoid effusion. She has been on Keppra and Lamictal.  The Lamictal was started in late January 2023 by her primary care.  In the emergency room on 09/28/2021 she was given IV Keppra 1500 mg x 1.  She was advised to continue with Keppra and Lamictal, Keppra was increased to 1000 mg in the morning and 1500 mg in the evening.  Urinalysis was nonsuspicious for a UTI.  Laboratory evaluation showed a magnesium level of 1.8, sodium mildly low at 133, potassium 5.1, glucose 160, BUN 57, creatinine 1.33, CBC showed WBC of 6.2, hemoglobin was  low at 9, hematocrit low at 27.9.  Repeat blood sugar 148. She missed interim appointments on appointment on 01/02/20 and 01/30/20.   Previously:    08/17/19: (She) reports problems with her balance for the past 2 years.  She reports that she was diagnosed with diabetes in the 70s and she started noticing occasional problems with walking straight, she would at times severe in one direction.  In the recent past she has had 2 falls.  She was in the home both times.  She fell backwards recently and hit her head against the corner of the furnace she says.  She had bent down to pick something up and when she stood up she overshot.  This happened on 07/02/2019.  On 1123, she fell in the bathroom after standing up from the toilet commode.  She does have occasional lightheadedness when standing up quickly.  She has a 4 pronged cane available and a walker from when she had back and neck surgeries but has not been using either.  She hydrates well with water, estimates that she drinks 3-4 bottles of water per day and up to 2 bottles per night.  She lives alone, she is widowed, she has  4 grown children.  She has a call alert button.  She is a non-smoker and does not drink alcohol and does not drink caffeine on a daily basis.  Her latest A1c was either 8 or 9 or 11 per her recollection.  She had blood work through her primary care physician in October.  I reviewed your office note from 07/06/2019.  She denies any recurrent headaches or vertiginous symptoms such as spinning sensation or nausea.  She denies any sudden onset of one-sided weakness or numbness or tingling or droopy face or slurring of speech.  She has had intermittent tingling sensation in her feet and left hand.       ROS:   14 system review of systems performed and negative with exception of those listed in HPI  PMH:  Past Medical History:  Diagnosis Date   Adult failure to thrive    Anemia    Anxiety    Atherosclerosis of aorta (HCC)    Central  cord syndrome at C4 level of cervical spinal cord, subsequent encounter (HCC)    CKD (chronic kidney disease)    stage 3   Depression    DM type 2 with diabetic peripheral neuropathy (HCC)    Dysphagia    GERD (gastroesophageal reflux disease)    Gout    High cholesterol    HTN (hypertension)    Hyponatremia    MDD (major depressive disorder)    Neck pain    Neuropathy    Quadriplegia, C1-C4 incomplete (HCC)    Radiculopathy    RLS (restless legs syndrome)    Urinary retention     PSH:  Past Surgical History:  Procedure Laterality Date   ABDOMINAL HYSTERECTOMY     ANTERIOR CERVICAL DECOMP/DISCECTOMY FUSION N/A 02/05/2021   Procedure: Cervical Three-Four  Anterior cervical decompression/discectomy/fusion;  Surgeon: Coletta Memos, MD;  Location: [facility] OR;  Service: Neurosurgery;  Laterality: N/A;  RM 20   APPENDECTOMY     BACK SURGERY     BIOPSY  09/22/2021   Procedure: BIOPSY;  Surgeon: Lanelle Bal, DO;  Location: [facility] ENDO SUITE;  Service: Endoscopy;;   CERVICAL DISC SURGERY     ESOPHAGOGASTRODUODENOSCOPY (EGD) WITH PROPOFOL N/A 09/22/2021   Procedure: ESOPHAGOGASTRODUODENOSCOPY (EGD) WITH PROPOFOL;  Surgeon: Lanelle Bal, DO;  Location: [facility] ENDO SUITE;  Service: Endoscopy;  Laterality: N/A;  1:00pm   HAND SURGERY     KNEE SURGERY      Social History:  Social History   Socioeconomic History   Marital status: Widowed    Spouse name: Not on file   Number of children: Not on file   Years of education: Not on file   Highest education level: Not on file  Occupational History   Not on file  Tobacco Use   Smoking status: Never   Smokeless tobacco: Never  Vaping Use   Vaping Use: Never used  Substance and Sexual Activity   Alcohol use: Never   Drug use: Never   Sexual activity: Never  Other Topics Concern   Not on file  Social History Narrative   L[facility] at [facility]   Social Determinants of Health   Financial Resource Strain: Not on file  Food  Insecurity: Not on file  Transportation Needs: Not on file  Physical Activity: Not on file  Stress: Not on file  Social Connections: Not on file  Intimate Partner Violence: Not on file    Family History:  Family History  Problem Relation Age of  Onset   Cancer Mother    Cardiomyopathy Father    Seizures Sister        childhood   Seizures Brother        in his 61s   Colon cancer Neg Hx     Medications:   Current Outpatient Medications on File Prior to Visit  Medication Sig Dispense Refill   acetaminophen (TYLENOL) 325 MG tablet Take 650 mg by mouth every 8 (eight) hours.     albuterol (VENTOLIN HFA) 108 (90 Base) MCG/ACT inhaler Inhale 1 puff into the lungs every 4 (four) hours as needed for wheezing or shortness of breath.     AMBULATORY NON FORMULARY MEDICATION Medication Name:  Continue monthly catheter changes with 72fr catheter at skilled nursing facility. 1 application MONTHLY   Artificial Saliva (BIOTENE MOISTURIZING MOUTH MT) Use as directed 1 application. in the mouth or throat at bedtime. 9 pm     aspirin EC 81 MG tablet Take 81 mg by mouth daily. Swallow whole.9 am     BENZONATATE PO Take 100 mg by mouth every 8 (eight) hours as needed (cough).     colchicine 0.6 MG tablet Take 0.6 mg by mouth daily as needed.     denosumab (PROLIA) 60 MG/ML SOSY injection Inject 60 mg into the skin every 6 (six) months.     DULoxetine (CYMBALTA) 60 MG capsule Take 60 mg by mouth daily.     fexofenadine (ALLEGRA) 180 MG tablet Take 180 mg by mouth daily.     gabapentin (NEURONTIN) 300 MG capsule Take 300 mg by mouth 3 (three) times daily.     guaiFENesin (MUCINEX) 600 MG 12 hr tablet Take by mouth 2 (two) times daily.     insulin aspart (NOVOLOG FLEXPEN) 100 UNIT/ML FlexPen Inject 5 Units into the skin 2 (two) times daily.     Insulin Pen Needle 30G X 5 MM MISC 1 Device by Does not apply route daily. 3/16"  0   lamoTRIgine (LAMICTAL) 25 MG tablet Take 25 mg by mouth daily. DX:  Unspecified convulsions     LANTUS SOLOSTAR 100 UNIT/ML Solostar Pen Inject 15 Units into the skin at bedtime.     levETIRAcetam (KEPPRA) 1000 MG tablet Take 1,000 mg by mouth 2 (two) times daily. In the morning and at Bedtime     levETIRAcetam (KEPPRA) 500 MG tablet Take 500 mg by mouth at bedtime. Along with 1000 mg for a total on 1500 mg in the pm (see other listing)     lidocaine (LIDODERM) 5 % Place onto the skin. Apply to Right shoulder as needed once a Morning. Remove & Discard patch within 12 hours or as directed by MD     Lidocaine-Glycerin (PREPARATION H EX) Apply topically. Maximum Strength (phenyleph-pramoxin-glycr-w.pet) [OTC] cream; 0.25-1 %; rectal Special Instructions: As needed for rectal itching/hemorrhoids. Once A Day     LORazepam (ATIVAN) 2 MG/ML injection Inject 0.5 mLs (1 mg total) into the vein every 15 (fifteen) minutes as needed. 4 mL 0   melatonin 3 MG TABS tablet Take 3 mg by mouth at bedtime.     NON FORMULARY Diet - NAS, Cons CHO     omeprazole (PRILOSEC) 20 MG capsule Take 20 mg by mouth daily.     rOPINIRole (REQUIP) 1 MG tablet Take 1 mg by mouth at bedtime.     rosuvastatin (CRESTOR) 20 MG tablet Take 20 mg by mouth daily.     tiZANidine (ZANAFLEX) 2 MG tablet Take 2 mg  by mouth every 6 (six) hours as needed for muscle spasms (for back and sciatic pain).     No current facility-administered medications on file prior to visit.    Allergies:   Allergies  Allergen Reactions   Ace Inhibitors Other (See Comments)    Hyperkalemia    Codeine    Sulfa Antibiotics       OBJECTIVE:  Physical Exam  Vitals:   07/16/22 1302  BP: 123/60  Pulse: 70  Weight: 167 lb (75.8 kg)  Height: 5\' 2"  (1.575 m)   Body mass index is 30.54 kg/m. No results found.   General: well developed, well nourished, very pleasant elderly female, seated, in no evident distress Head: head normocephalic and atraumatic.   Neck: supple with no carotid or supraclavicular  bruits Cardiovascular: regular rate and rhythm, no murmurs Skin:  no rash/petichiae Vascular:  Normal pulses all extremities   Neurologic Exam Mental Status: Awake and fully alert. Oriented to place and time. Recent and remote memory intact. Attention span, concentration and fund of knowledge appropriate. Mood and affect appropriate.  Cranial Nerves: Pupils equal, briskly reactive to light. Extraocular movements full without nystagmus. Visual fields full to confrontation. Hearing intact. Facial sensation intact. Face, tongue, palate moves normally and symmetrically.  Motor: Generalized weakness and muscle atrophy 4/5 BUE and 2/5 BLE Sensory.:  Decreased sensory BLE distally Coordination: Rapid alternating movements slowed. Finger-to-nose performed accurately bilaterally and heel-to-shin unable to perform. Gait and Station: Deferred as patient nonambulatory Reflexes: 1+ and symmetric. Toes downgoing.         ASSESSMENT/PLAN: [name] is a 86 y.o. year old female with.    Seizure disorder:  No recent seizure, prior seizure 10/2021 Per patient strong request, will stop lamotrigine (currently at 25mg  daily, no indication to titrate prior to discontinuing) and continue Keppra 1000/1500. She is aware of increased risk of breakthrough seizure with stopping medication. Advised if any breakthrough seizures should occur, we can plan on either increasing Keppra dosage to 1500mg  BID or restart lamotrigine, she verbalized understanding EEG 10/16/2021 no epileptiform discharges seen Request patient/family or SNF contact office with any breakthrough seizures    Follow up in 9 months or call earlier if needed   CC:  PCP: Sharee Holster, NP    I spent 21 minutes of face-to-face and non-face-to-face time with patient and daughter.  This included previsit chart review, lab review, study review,  electronic health record documentation, patient and daughter education and discussion regarding  above diagnoses and treatment plan and answered all the questions to patient and daughter satisfaction   Ihor Austin, [facility]  [facility] [address] [place], [place] [address]  Phone [phone] Fax [phone] Note: This document was prepared with digital dictation and possible smart phrase technology. Any transcriptional errors that result from this process are unintentional.

## 2022-07-16 ENCOUNTER — Ambulatory Visit (INDEPENDENT_AMBULATORY_CARE_PROVIDER_SITE_OTHER): Payer: Medicare HMO | Admitting: Adult Health

## 2022-07-16 ENCOUNTER — Encounter: Payer: Self-pay | Admitting: Adult Health

## 2022-07-16 VITALS — BP 123/60 | HR 70 | Ht 62.0 in | Wt 167.0 lb

## 2022-07-16 DIAGNOSIS — G40909 Epilepsy, unspecified, not intractable, without status epilepticus: Secondary | ICD-10-CM

## 2022-07-16 NOTE — Patient Instructions (Addendum)
We can try to stop lamotrigine today and continue on current dose of keppra  If you seizure activity should occur, we will either need to increase keppra dosage or restart lamotrigine   Please call office with any seizure activity    Follow-up in 9 months or call earlier if needed

## 2022-07-21 ENCOUNTER — Non-Acute Institutional Stay (SKILLED_NURSING_FACILITY): Payer: Medicare HMO | Admitting: Internal Medicine

## 2022-07-21 ENCOUNTER — Encounter: Payer: Self-pay | Admitting: Internal Medicine

## 2022-07-21 DIAGNOSIS — Z862 Personal history of diseases of the blood and blood-forming organs and certain disorders involving the immune mechanism: Secondary | ICD-10-CM

## 2022-07-21 DIAGNOSIS — G8252 Quadriplegia, C1-C4 incomplete: Secondary | ICD-10-CM

## 2022-07-21 DIAGNOSIS — E1122 Type 2 diabetes mellitus with diabetic chronic kidney disease: Secondary | ICD-10-CM | POA: Diagnosis not present

## 2022-07-21 DIAGNOSIS — N1832 Chronic kidney disease, stage 3b: Secondary | ICD-10-CM

## 2022-07-21 DIAGNOSIS — Z794 Long term (current) use of insulin: Secondary | ICD-10-CM

## 2022-07-21 DIAGNOSIS — L309 Dermatitis, unspecified: Secondary | ICD-10-CM

## 2022-07-21 DIAGNOSIS — M810 Age-related osteoporosis without current pathological fracture: Secondary | ICD-10-CM

## 2022-07-21 DIAGNOSIS — N189 Chronic kidney disease, unspecified: Secondary | ICD-10-CM

## 2022-07-21 NOTE — Patient Instructions (Signed)
See assessment and plan under each diagnosis in the problem list and acutely for this visit 

## 2022-07-21 NOTE — Progress Notes (Unsigned)
NURSING HOME LOCATION:  [facility] ROOM NUMBER:  [id number] W  CODE STATUS:  DNR  PCP:  [name] NP  This is a nursing facility follow up visit of chronic medical diagnoses & o document compliance with Regulation 483.30 (c) in The [place] Manual Phase 2 which mandates caregiver visit ( visits can alternate among physician, PA or NP as per statutes) within 10 days of 30 days / 60 days/ 90 days post admission to SNF date    Interim medical record and care since last SNF visit was updated with review of diagnostic studies and change in clinical status since last visit were documented.  HPI: She is a permanent resident of this facility with medical diagnoses of adult failure to thrive, chronic anemia, aortic atherosclerosis, C4 central cord syndrome, diabetes with CKD, GERD, history of gout, dyslipidemia, essential hypertension, major depressive disorder, peripheral neuropathy, osteoporosis and RLS.  Labs are current and reveal hyponatremia with a value of 131.  Sodium ranges is 128-132.  CKD has improved with current creatinine 1.75 and GFR 29.  Prior values were 2.15 and GFR 22.  Her anemia is essentially stable.  Current H/H was 8.5/26.5 with prior values of 8.7/26.2.  On 8/28 A1c was 7% indicating good control.  Review of systems: Her major concern is rash on the abdomen and back as well as on the medial thighs.  She initially told me that these have been present several months but then said they been present for over a year (Note: this was not documented @ my last exam 8/31).  She states that the rash over the thighs is pruritic but the abdominal rash is not.  She describes that rash as "stinging."  She denies definite extrinsic symptoms other than some wheezing. She describes some chronic anorexia.  She has persistent stiffness in her arms and numbness in her hands.  Constitutional: No fever, significant weight change, fatigue  Eyes: No redness, discharge,  pain, vision change ENT/mouth: No nasal congestion,  purulent discharge, earache, change in hearing, sore throat  Cardiovascular: No chest pain, palpitations, paroxysmal nocturnal dyspnea, edema  Respiratory: No cough, sputum production, hemoptysis, DOE, significant snoring, apnea   Gastrointestinal: No heartburn, dysphagia, abdominal pain, nausea /vomiting, rectal bleeding, melena, change in bowels Genitourinary: No dysuria, hematuria, pyuria, incontinence, nocturia Musculoskeletal: No joint swelling, weakness, pain Neurologic: No dizziness, headache, syncope, seizures Psychiatric: No significant anxiety, depression, insomnia Endocrine: No change in hair/skin/nails, excessive thirst, excessive hunger, excessive urination  Hematologic/lymphatic: No significant bruising, lymphadenopathy, abnormal bleeding Allergy/immunology: No itchy/watery eyes, significant sneezing, urticaria, angioedema  Physical exam:  Pertinent or positive findings: She was initially asleep exhibiting hypopnea.  Upon awakening affect was flat. Complete dentures are present. First heart sound is increased.  Abdomen is distended but soft.  There is slight nonpitting edema peripherally.  Dorsalis pedis pulses are stronger than posterior tibial pulses.  All extremities are weak to opposition especially the left upper extremity.  There is a diffuse irregular erythematous  & slightly papular rash on the upper abdomen extending to the back.  No vesicles present. This is nonblanching.  She also has a slightly keratotic and rough bland erythematous rash over the upper medial thighs. This rash also is non blanching. She has some scattered bruising of the left upper extremity.  The right great toenail is dark.  General appearance: Adequately nourished; no acute distress, increased work of breathing is present.   Lymphatic: No lymphadenopathy about the head, neck, axilla.  Eyes: No conjunctival inflammation or lid edema is present. There is  no scleral icterus. Ears:  External ear exam shows no significant lesions or deformities.   Nose:  External nasal examination shows no deformity or inflammation. Nasal mucosa are pink and moist without lesions, exudates Neck:  No thyromegaly, masses, tenderness noted.    Heart:  Normal rate and regular rhythm. S2 normal without gallop, murmur, click, rub .  Lungs: Chest clear to auscultation without wheezes, rhonchi, rales, rubs. Abdomen: Bowel sounds are normal. Abdomen is soft and nontender with no organomegaly, hernias, masses. GU: Deferred  Extremities:  No cyanosis, clubbing, edema  Neurologic exam :Balance, Rhomberg, finger to nose testing could not be completed due to clinical state Skin: Warm & dry w/o tenting.  See summary under each active problem in the Problem List with associated updated therapeutic plan

## 2022-07-22 ENCOUNTER — Non-Acute Institutional Stay (SKILLED_NURSING_FACILITY): Payer: Medicare HMO | Admitting: Adult Health

## 2022-07-22 DIAGNOSIS — L309 Dermatitis, unspecified: Secondary | ICD-10-CM

## 2022-07-22 NOTE — Assessment & Plan Note (Addendum)
04/27/2022 A1c 7% indicating good control.  A1c should be reupdated in the next several weeks especially as a burst of steroids is considered for the diffuse dermatitis of the abdomen and medial thighs. CKD has improved slightly with current creatinine of 1.75 and GFR 29 indicating high stage IV CKD.  With a GFR of 15-29 dosage range for gabapentin should be 200-700 mg a day.  She is on 300 mg 3 times daily.  Dose adjustment will be discussed with NP.

## 2022-07-22 NOTE — Assessment & Plan Note (Signed)
See 09/20/2021.  She has a papular irregular dermatitis over the upper abdomen extending to the back which is nonblanching.  She also has a bland slightly keratotic and rough dermatitis on the medial thighs which is pruritic by history.  Short course of steroids will be initiated.  If there is no improvement or if there is progression; Dermatology referral for biopsy.

## 2022-07-22 NOTE — Assessment & Plan Note (Signed)
She is on Prolia.  Bone turnover markers will be checked to assess response to therapy.

## 2022-07-22 NOTE — Assessment & Plan Note (Signed)
She has persistent weakness in all extremities, especially left upper extremity.  Clinically there is been no significant progression.

## 2022-07-22 NOTE — Assessment & Plan Note (Signed)
Chronic anemia is essentially stable with current H/H of 8.5/26.5.  No bleeding dyscrasias reported by staff.  Continue to monitor.

## 2022-07-27 ENCOUNTER — Encounter: Payer: Self-pay | Admitting: Adult Health

## 2022-07-27 NOTE — Progress Notes (Signed)
Location:  [facility] Room Number: [id number] Place of Service:  SNF (31)   CODE STATUS: dnr   Allergies  Allergen Reactions   Ace Inhibitors Other (See Comments)    Hyperkalemia    Codeine    Sulfa Antibiotics     Chief Complaint  Patient presents with   Acute Visit    Rash     HPI:  Staff has noted that she has a rash on left abdomen and flank and left thigh. There are no reports of pain present; she does have itching present. She has tried lotion without relief present. There are no reports of fevers present. Does not have open lesions present.   Past Medical History:  Diagnosis Date   Adult failure to thrive    Anemia    Anxiety    Atherosclerosis of aorta (HCC)    Central cord syndrome at C4 level of cervical spinal cord, subsequent encounter ([place])    CKD (chronic kidney disease)    stage 3   Depression    DM type 2 with diabetic peripheral neuropathy (HCC)    Dysphagia    GERD (gastroesophageal reflux disease)    Gout    High cholesterol    HTN (hypertension)    Hyponatremia    MDD (major depressive disorder)    Neck pain    Neuropathy    Quadriplegia, C1-C4 incomplete (HCC)    Radiculopathy    RLS (restless legs syndrome)    Urinary retention     Past Surgical History:  Procedure Laterality Date   ABDOMINAL HYSTERECTOMY     ANTERIOR CERVICAL DECOMP/DISCECTOMY FUSION N/A 02/05/2021   Procedure: Cervical Three-Four  Anterior cervical decompression/discectomy/fusion;  Surgeon: Ashok Pall, MD;  Location: [facility];  Service: Neurosurgery;  Laterality: N/A;  RM 20   APPENDECTOMY     BACK SURGERY     BIOPSY  09/22/2021   Procedure: BIOPSY;  Surgeon: [name], DO;  [facility];  Service: Endoscopy;;   CERVICAL DISC SURGERY     ESOPHAGOGASTRODUODENOSCOPY (EGD) WITH PROPOFOL N/A 09/22/2021   Procedure: ESOPHAGOGASTRODUODENOSCOPY (EGD) WITH PROPOFOL;  Surgeon: [name], DO;  [facility];  Service:  Endoscopy;  Laterality: N/A;  1:00pm   HAND SURGERY     KNEE SURGERY      Social History   Socioeconomic History   Marital status: Widowed    Spouse name: Not on file   Number of children: Not on file   Years of education: Not on file   Highest education level: Not on file  Occupational History   Not on file  Tobacco Use   Smoking status: Never   Smokeless tobacco: Never  Vaping Use   Vaping Use: Never used  Substance and Sexual Activity   Alcohol use: Never   Drug use: Never   Sexual activity: Never  Other Topics Concern   Not on file  Social History Narrative   Lives at [facility]   Social Determinants of Health   Financial Resource Strain: Not on file  Food Insecurity: Not on file  Transportation Needs: Not on file  Physical Activity: Not on file  Stress: Not on file  Social Connections: Not on file  Intimate Partner Violence: Not on file   Family History  Problem Relation Age of Onset   Cancer Mother    Cardiomyopathy Father    Seizures Sister        childhood   Seizures  Brother        in his 63s   Colon cancer Neg Hx       VITAL SIGNS BP (!) 125/59   Pulse 66   Temp (!) 97.3 F (36.3 C)   Resp 20   Ht 5\' 2"  (1.575 m)   Wt 157 lb (71.2 kg)   SpO2 94%   BMI 28.72 kg/m   Outpatient Encounter Medications as of 07/22/2022  Medication Sig   acetaminophen (TYLENOL) 325 MG tablet Take 650 mg by mouth every 8 (eight) hours.   albuterol (VENTOLIN HFA) 108 (90 Base) MCG/ACT inhaler Inhale 1 puff into the lungs every 4 (four) hours as needed for wheezing or shortness of breath.   AMBULATORY NON FORMULARY MEDICATION Medication Name:  Continue monthly catheter changes with 58fr catheter at skilled nursing facility.   Artificial Saliva (BIOTENE MOISTURIZING MOUTH MT) Use as directed 1 application. in the mouth or throat at bedtime. 9 pm   aspirin EC 81 MG tablet Take 81 mg by mouth daily. Swallow whole.9 am   calcium carbonate (TUMS EX) 750 MG  chewable tablet Chew 1 tablet by mouth 3 (three) times daily.   camphor-menthol (SARNA) lotion Apply 1 Application topically 2 (two) times daily as needed for itching.   denosumab (PROLIA) 60 MG/ML SOSY injection Inject 60 mg into the skin every 6 (six) months.   DULoxetine HCl 40 MG CSDR Take 40 mg by mouth daily.   fexofenadine (ALLEGRA) 180 MG tablet Take 180 mg by mouth daily.   gabapentin (NEURONTIN) 300 MG capsule Take 300 mg by mouth 3 (three) times daily.   guaiFENesin (MUCINEX) 600 MG 12 hr tablet Take by mouth 2 (two) times daily.   insulin aspart (NOVOLOG FLEXPEN) 100 UNIT/ML FlexPen Inject 5 Units into the skin 2 (two) times daily.   Insulin Pen Needle 30G X 5 MM MISC 1 Device by Does not apply route daily. 3/16"   LANTUS SOLOSTAR 100 UNIT/ML Solostar Pen Inject 15 Units into the skin at bedtime.   levETIRAcetam (KEPPRA) 1000 MG tablet Take 1,000 mg by mouth 2 (two) times daily. In the morning and at Bedtime   levETIRAcetam (KEPPRA) 500 MG tablet Take 500 mg by mouth at bedtime. Along with 1000 mg for a total on 1500 mg in the pm (see other listing)   lidocaine (LIDODERM) 5 % Place onto the skin. Apply to Right shoulder as needed once a Morning. Remove & Discard patch within 12 hours or as directed by MD   Lidocaine-Glycerin (PREPARATION H EX) Apply topically. Maximum Strength (phenyleph-pramoxin-glycr-w.pet) [OTC] cream; 0.25-1 %; rectal Special Instructions: As needed for rectal itching/hemorrhoids. Once A Day   LORazepam (ATIVAN) 2 MG/ML injection Inject 0.5 mLs (1 mg total) into the vein every 15 (fifteen) minutes as needed.   melatonin 3 MG TABS tablet Take 3 mg by mouth at bedtime.   Multiple Vitamin (THEREMS PO) Take 1 tablet by mouth daily. With Folic Acid   NON FORMULARY Diet - NAS, Cons CHO   omeprazole (PRILOSEC) 20 MG capsule Take 20 mg by mouth daily.   rOPINIRole (REQUIP) 1 MG tablet Take 1 mg by mouth at bedtime.   rosuvastatin (CRESTOR) 20 MG tablet Take 20 mg by  mouth daily.   tiZANidine (ZANAFLEX) 2 MG tablet Take 2 mg by mouth every 6 (six) hours as needed for muscle spasms (for back and sciatic pain).   zinc oxide 20 % ointment Apply 1 Application topically 3 (three) times daily as needed  for irritation (apply every shift).   No facility-administered encounter medications on file as of 07/22/2022.     SIGNIFICANT DIAGNOSTIC EXAMS  PREVIOUS    07-06-21: ct head:  No intracranial abnormality or other acute findings. Chronic bilateral maxillary sinus disease.   07-06-21: ct of abdomen and pelvis:  1. Heterogeneous enhancement of the left kidney with mild left perinephric edema, suspicious for pyelonephritis. There is also mild bladder wall thickening and perivesicular fat stranding with Foley catheter in place. Improvement in bilateral hydronephrosis from prior exam. 2. Stool distends the rectum at 7.6 cm with mild distal rectal wall thickening, suspicious for fecal impaction. 3. Colonic diverticulosis without diverticulitis. Aortic Atherosclerosis    07-07-21: MRI of head:  No acute intracranial finding. No sign of acute infarction. No lesion seen to explain seizure. The patient does have mild chronic small-vessel ischemic changes considering age affecting the pons, thalami and cerebral hemispheric white matter. Left mastoid effusion   09-14-21: ct of head:  No acute intracranial abnormality. 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 New left sphenoid sinus air-fluid level, which may be seen with acute sinusitis.   09-28-21: ct of head No acute intracranial findings are seen in noncontrast CT brain. Chronic sinusitis.  Bilateral mastoid effusions.   04-15-22; dexa: t score -3.391   NO NEW EXAMS         LABS REVIEWED PREVIOUS    07-06-21: wbc 7.6; hgb 9.7; hct 29.8; mcv 89,2 plt 409; glucose 92; bun 44; creat 1.09; k+ 5,3l na++ 136; ca 9.4; GFR 51; liver normal albumin 3.4  Hgb A1C: 8.3; urine culture: proteus mirabilis; + 30,000 colonies  klebsiella pneumoniae  08-29-21: wbc 5.6; hgb 9.0; hct 27.6; mcv 94.2 plt 244; glucose 106; bun 80; creat 1.35; k+ 5.8; na++ 134; ca 9.0; GFR 39 liver normal albumin 3.5 vit B 12: 446; folate 7.7; iron 79 tibc 280; ferritin 178 09-04-21: glucose 66; bun 79; creat 1.44; k+ 5.5; na++ 135; ca 9.2; GFR 36 09-14-21: wbc 6.2; hgb 8.9; hct 27.1; mcv 96.1 plt 217; glucose 171; bun 58; creat 1.69; k+ 5.8; na++ 134; ca 8.7; GFR 30; urine culture: multiple species 09-15-20: k+ 6.3 09-16-21: k+ 5.0  09-28-21: wbc 6.0; hgb 9.2; hct 27.9; mcv 95.2 plt 252; glucose 160; bun 57; creat 1.33; k+ 5.1; na++ 133; ca 8.9; GFR 40; mag 1.8; liver normal albumin 3.8  10-17-21: wbc 5.9; hgb 8.9; hct 26.6; mcv 95.3 plt 223; glucose 171; bun 44; creat 1.31;k+ 4.8; na++ 134; ca 8.6; gfr 40. D-dimer 1.21; crp 3.2  10-27-21: hgb a1c 6.5 11-04-21: CRp 21.2; d-dimer 1.46 11-24-21: wbc 5.4; hgb 8.7; hct 27.5; mcv 94.2 plt 218; sed rate 80; ana: neg; crp 0.6 01-15-22: hgb a1c 6.7 chol 148; ldl 87; trig 112; hdl 39; tsh 5.663  02-11-22: urine culture: multiple bacteria  04-09-22: liver normal protein 6.0 albumin 3.3; chol 143; ldl 81; trig 71; hdl 48; tsh 3.502  04-27-22; wbc 6.6; hgb 8.7; hct 26.2; mcv 94.6 plt 231; glucose 152; bun 66; creat 1.26; k+ 4.7; na++ 132; ca 8.9; gfr 42; protein 6.7 albumin 3.4 hgb a1c 7.0; vitamin D 37.96   NO NEW LABS.    Review of Systems  Constitutional:  Negative for malaise/fatigue.  Respiratory:  Negative for cough and shortness of breath.   Cardiovascular:  Negative for chest pain, palpitations and leg swelling.  Gastrointestinal:  Negative for abdominal pain, constipation and heartburn.  Musculoskeletal:  Negative for back pain, joint pain and myalgias.  Skin:  Positive for itching and rash.  Neurological:  Negative for dizziness.  Psychiatric/Behavioral:  The patient is not nervous/anxious.     Physical Exam Constitutional:      General: She is not in acute distress.    Appearance: She is  well-developed. She is not diaphoretic.  Neck:     Thyroid: No thyromegaly.  Cardiovascular:     Rate and Rhythm: Normal rate and regular rhythm.     Pulses: Normal pulses.     Heart sounds: Normal heart sounds.  Pulmonary:     Effort: Pulmonary effort is normal. No respiratory distress.     Breath sounds: Normal breath sounds.  Abdominal:     General: Bowel sounds are normal. There is no distension.     Palpations: Abdomen is soft.     Tenderness: There is no abdominal tenderness.  Musculoskeletal:     Cervical back: Neck supple.     Right lower leg: No edema.     Left lower leg: No edema.     Comments: Does not move legs; able to move upper extremities     Lymphadenopathy:     Cervical: No cervical adenopathy.  Skin:    General: Skin is warm and dry.     Comments: Has erythremic rash on left flank; abdomen; left thigh. Red and raised without vesicales present    Neurological:     Mental Status: She is alert and oriented to person, place, and time.       ASSESSMENT/ PLAN:  TODAY  Dermatitis: will setup a dermatology consult; will begin prednisone 40 mg diayl for 5 days; will begin triamcinolone 0.1% to rash twice daily    [name] NP [facility]  [phone]

## 2022-07-28 DIAGNOSIS — L309 Dermatitis, unspecified: Secondary | ICD-10-CM | POA: Insufficient documentation

## 2022-07-30 ENCOUNTER — Other Ambulatory Visit (HOSPITAL_COMMUNITY)
Admission: RE | Admit: 2022-07-30 | Discharge: 2022-07-30 | Disposition: A | Discharge status: Home / Self Care | Payer: Medicare HMO | Source: Skilled Nursing Facility | Attending: Adult Health | Admitting: Adult Health

## 2022-07-30 DIAGNOSIS — E1159 Type 2 diabetes mellitus with other circulatory complications: Secondary | ICD-10-CM | POA: Insufficient documentation

## 2022-07-31 LAB — MICROALBUMIN / CREATININE URINE RATIO
Creatinine, Urine: 46.9 mg/dL
Microalb Creat Ratio: 127 mg/g creat — ABNORMAL HIGH (ref 0–29)
Microalb, Ur: 59.7 ug/mL — ABNORMAL HIGH

## 2022-08-18 ENCOUNTER — Ambulatory Visit: Payer: Medicare HMO | Admitting: Urology

## 2022-08-18 ENCOUNTER — Encounter: Payer: Self-pay | Admitting: Adult Health

## 2022-08-18 ENCOUNTER — Non-Acute Institutional Stay (SKILLED_NURSING_FACILITY): Payer: Medicare HMO | Admitting: Adult Health

## 2022-08-18 DIAGNOSIS — D638 Anemia in other chronic diseases classified elsewhere: Secondary | ICD-10-CM

## 2022-08-18 DIAGNOSIS — F419 Anxiety disorder, unspecified: Secondary | ICD-10-CM

## 2022-08-18 DIAGNOSIS — N184 Chronic kidney disease, stage 4 (severe): Secondary | ICD-10-CM | POA: Diagnosis not present

## 2022-08-18 DIAGNOSIS — E1142 Type 2 diabetes mellitus with diabetic polyneuropathy: Secondary | ICD-10-CM

## 2022-08-18 DIAGNOSIS — R339 Retention of urine, unspecified: Secondary | ICD-10-CM

## 2022-08-18 DIAGNOSIS — E1122 Type 2 diabetes mellitus with diabetic chronic kidney disease: Secondary | ICD-10-CM

## 2022-08-18 NOTE — Progress Notes (Signed)
Location:  [facility] Room Number: [place] of Service:  SNF (31)   CODE STATUS: DNR  Allergies  Allergen Reactions   Ace Inhibitors Other (See Comments)    Hyperkalemia    Codeine    Sulfa Antibiotics     Chief Complaint  Patient presents with   Medical Management of Chronic Issues                                        Diabetic peripheral neuropathy: CKD stage 4 due to type 2 diabetes mellitus:  anemia associated with type 2 diabetes mellitus due to underlying condition:  Urine retention: Chronic anxiety: .     HPI:  She is a 86 year old long term resident of this facility being seen for the management of her chronic illnesses: Diabetic peripheral neuropathy: CKD stage 4 due to type 2 diabetes mellitus:  anemia associated with type 2 diabetes mellitus due to underlying condition  Urine retention: Chronic anxiety. There are no reports of anxiety present. Her gfr is 22; now CKD stage 4. She does get out of bed periodically.   Past Medical History:  Diagnosis Date   Adult failure to thrive    Anemia    Anxiety    Atherosclerosis of aorta (HCC)    Central cord syndrome at C4 level of cervical spinal cord, subsequent encounter ([place])    CKD (chronic kidney disease)    stage 3   Depression    DM type 2 with diabetic peripheral neuropathy (HCC)    Dysphagia    GERD (gastroesophageal reflux disease)    Gout    High cholesterol    HTN (hypertension)    Hyponatremia    MDD (major depressive disorder)    Neck pain    Neuropathy    Quadriplegia, C1-C4 incomplete (HCC)    Radiculopathy    RLS (restless legs syndrome)    Urinary retention     Past Surgical History:  Procedure Laterality Date   ABDOMINAL HYSTERECTOMY     ANTERIOR CERVICAL DECOMP/DISCECTOMY FUSION N/A 02/05/2021   Procedure: Cervical Three-Four  Anterior cervical decompression/discectomy/fusion;  Surgeon: Ashok Pall, MD;  Location: [facility];  Service: Neurosurgery;  Laterality: N/A;   RM 20   APPENDECTOMY     BACK SURGERY     BIOPSY  09/22/2021   Procedure: BIOPSY;  Surgeon: [name], DO;  [facility];  Service: Endoscopy;;   CERVICAL DISC SURGERY     ESOPHAGOGASTRODUODENOSCOPY (EGD) WITH PROPOFOL N/A 09/22/2021   Procedure: ESOPHAGOGASTRODUODENOSCOPY (EGD) WITH PROPOFOL;  Surgeon: [name], DO;  [facility];  Service: Endoscopy;  Laterality: N/A;  1:00pm   HAND SURGERY     KNEE SURGERY      Social History   Socioeconomic History   Marital status: Widowed    Spouse name: Not on file   Number of children: Not on file   Years of education: Not on file   Highest education level: Not on file  Occupational History   Not on file  Tobacco Use   Smoking status: Never   Smokeless tobacco: Never  Vaping Use   Vaping Use: Never used  Substance and Sexual Activity   Alcohol use: Never   Drug use: Never   Sexual activity: Never  Other Topics Concern   Not on file  Social History Narrative  Lives at [facility] Strain: Not on file  Food Insecurity: Not on file  Transportation Needs: Not on file  Physical Activity: Not on file  Stress: Not on file  Social Connections: Not on file  Intimate Partner Violence: Not on file   Family History  Problem Relation Age of Onset   Cancer Mother    Cardiomyopathy Father    Seizures Sister        childhood   Seizures Brother        in his 74s   Colon cancer Neg Hx       VITAL SIGNS BP (!) 142/67   Pulse 76   Temp 98.7 F (37.1 C)   Ht 5\' 2"  (1.575 m)   Wt 165 lb 1.6 oz (74.9 kg)   SpO2 94%   BMI 30.20 kg/m   Outpatient Encounter Medications as of 08/18/2022  Medication Sig   acetaminophen (TYLENOL) 325 MG tablet Take 650 mg by mouth every 8 (eight) hours.   AMBULATORY NON FORMULARY MEDICATION Medication Name:  Continue monthly catheter changes with 62fr catheter at skilled nursing facility.    Artificial Saliva (BIOTENE MOISTURIZING MOUTH MT) Use as directed 1 application. in the mouth or throat at bedtime. 9 pm   aspirin EC 81 MG tablet Take 81 mg by mouth daily. Swallow whole.9 am   calcium carbonate (TUMS EX) 750 MG chewable tablet Chew 1 tablet by mouth 3 (three) times daily.   camphor-menthol (SARNA) lotion Apply 1 Application topically 2 (two) times daily as needed for itching.   denosumab (PROLIA) 60 MG/ML SOSY injection Inject 60 mg into the skin every 6 (six) months.   DULoxetine HCl 40 MG CSDR Take 40 mg by mouth daily.   fexofenadine (ALLEGRA) 180 MG tablet Take 180 mg by mouth daily.   gabapentin (NEURONTIN) 300 MG capsule Take 300 mg by mouth 3 (three) times daily.   insulin aspart (NOVOLOG FLEXPEN) 100 UNIT/ML FlexPen Inject 5 Units into the skin 2 (two) times daily.   Insulin Pen Needle 30G X 5 MM MISC 1 Device by Does not apply route daily. 3/16"   LANTUS SOLOSTAR 100 UNIT/ML Solostar Pen Inject 15 Units into the skin at bedtime.   levETIRAcetam (KEPPRA) 1000 MG tablet Take 1,000 mg by mouth daily. At 9 am, see other listings   levETIRAcetam (KEPPRA) 1000 MG tablet Take 1,000 mg by mouth at bedtime. At 9 pm with 500 mg tablet for a total of 1500 mg at bedtime   levETIRAcetam (KEPPRA) 500 MG tablet Take 500 mg by mouth at bedtime. Along with 1000 mg for a total on 1500 mg in the pm (see other listing)   lidocaine (LIDODERM) 5 % Place onto the skin 2 (two) times daily as needed. Apply to Right shoulder as needed once a morning and remove at bedtime   LORazepam (ATIVAN) 2 MG/ML injection Inject 0.5 mLs (1 mg total) into the vein every 15 (fifteen) minutes as needed.   magnesium hydroxide (MILK OF MAGNESIA) 400 MG/5ML suspension Take 30 mLs by mouth daily as needed for mild constipation.   melatonin 3 MG TABS tablet Take 3 mg by mouth at bedtime.   mirtazapine (REMERON) 7.5 MG tablet Take 7.5 mg by mouth at bedtime. 9 pm   Multiple Vitamin (THEREMS PO) Take 1 tablet by mouth  daily. With Folic Acid   NON FORMULARY Diet - NAS, Cons CHO   omeprazole (PRILOSEC) 20  MG capsule Take 20 mg by mouth daily.   rOPINIRole (REQUIP) 1 MG tablet Take 1 mg by mouth at bedtime.   rosuvastatin (CRESTOR) 20 MG tablet Take 20 mg by mouth daily.   tiZANidine (ZANAFLEX) 2 MG tablet Take 2 mg by mouth every 6 (six) hours as needed for muscle spasms (for back and sciatic pain).   triamcinolone cream (KENALOG) 0.1 % Apply 1 Application topically 2 (two) times daily. 9 am and 9 pm   zinc oxide 20 % ointment Apply 1 Application topically 3 (three) times daily as needed for irritation (apply every shift).   No facility-administered encounter medications on file as of 08/18/2022.     SIGNIFICANT DIAGNOSTIC EXAMS  PREVIOUS    09-14-21: ct of head:  No acute intracranial abnormality. 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 New left sphenoid sinus air-fluid level, which may be seen with acute sinusitis.  09-28-21: ct of head No acute intracranial findings are seen in noncontrast CT brain. Chronic sinusitis.  Bilateral mastoid effusions.  04-15-22; dexa: t score -3.391  NO NEW EXAMS     LABS REVIEWED PREVIOUS   08-29-21: wbc 5.6; hgb 9.0; hct 27.6; mcv 94.2 plt 244; glucose 106; bun 80; creat 1.35; k+ 5.8; na++ 134; ca 9.0; GFR 39 liver normal albumin 3.5 vit B 12: 446; folate 7.7; iron 79 tibc 280; ferritin 178 09-04-21: glucose 66; bun 79; creat 1.44; k+ 5.5; na++ 135; ca 9.2; GFR 36 09-14-21: wbc 6.2; hgb 8.9; hct 27.1; mcv 96.1 plt 217; glucose 171; bun 58; creat 1.69; k+ 5.8; na++ 134; ca 8.7; GFR 30; urine culture: multiple species 09-15-20: k+ 6.3 09-16-21: k+ 5.0  09-28-21: wbc 6.0; hgb 9.2; hct 27.9; mcv 95.2 plt 252; glucose 160; bun 57; creat 1.33; k+ 5.1; na++ 133; ca 8.9; GFR 40; mag 1.8; liver normal albumin 3.8  10-17-21: wbc 5.9; hgb 8.9; hct 26.6; mcv 95.3 plt 223; glucose 171; bun 44; creat 1.31;k+ 4.8; na++ 134; ca 8.6; gfr 40. D-dimer 1.21; crp 3.2  10-27-21: hgb a1c  6.5 11-04-21: CRp 21.2; d-dimer 1.46 11-24-21: wbc 5.4; hgb 8.7; hct 27.5; mcv 94.2 plt 218; sed rate 80; ana: neg; crp 0.6 01-15-22: hgb a1c 6.7 chol 148; ldl 87; trig 112; hdl 39; tsh 5.663  02-11-22: urine culture: multiple bacteria  04-09-22: liver normal protein 6.0 albumin 3.3; chol 143; ldl 81; trig 71; hdl 48; tsh 3.502  04-27-22; wbc 6.6; hgb 8.7; hct 26.2; mcv 94.6 plt 231; glucose 152; bun 66; creat 1.26; k+ 4.7; na++ 132; ca 8.9; gfr 42; protein 6.7 albumin 3.4 hgb a1c 7.0; vitamin D 37.96  TODAY  07-09-22: wbc 9.3; hgb 8.5; hct 26.9; mcv 96.8 plt 196; glucose 200; bun 65; creat 2.15; k+ 5.0; na++ 128; ca 7.8; gfr 22 07-10-22: glucose 130; bun 59; creat 1.75; k+ 4.2; na++ 131; ca 7.6; gfr 29 07-30-22: urine micro-albumin 59.7; ACR 127     Review of Systems  Constitutional:  Negative for malaise/fatigue.  Respiratory:  Negative for cough and shortness of breath.   Cardiovascular:  Negative for chest pain, palpitations and leg swelling.  Gastrointestinal:  Negative for abdominal pain, constipation and heartburn.  Musculoskeletal:  Negative for back pain, joint pain and myalgias.  Skin: Negative.   Neurological:  Negative for dizziness.  Psychiatric/Behavioral:  The patient is not nervous/anxious.    Physical Exam Constitutional:      General: She is not in acute distress.    Appearance: She is well-developed. She is not diaphoretic.  Neck:     Thyroid: No thyromegaly.  Cardiovascular:     Rate and Rhythm: Normal rate and regular rhythm.     Pulses: Normal pulses.     Heart sounds: Normal heart sounds.  Pulmonary:     Effort: Pulmonary effort is normal. No respiratory distress.     Breath sounds: Normal breath sounds.  Abdominal:     General: Bowel sounds are normal. There is no distension.     Palpations: Abdomen is soft.     Tenderness: There is no abdominal tenderness.  Genitourinary:    Comments: foley Musculoskeletal:     Cervical back: Neck supple.     Right lower  leg: No edema.     Left lower leg: No edema.     Comments: : Does not move legs; able to move upper extremities     Lymphadenopathy:     Cervical: No cervical adenopathy.  Skin:    General: Skin is warm and dry.  Neurological:     Mental Status: She is alert and oriented to person, place, and time.  Psychiatric:        Mood and Affect: Mood normal.       ASSESSMENT/ PLAN:  TODAY  Diabetic peripheral neuropathy: will continue gabapentin 300 mg three times daily  2. CKD stage 4 due to type 2 diabetes mellitus: bun 59; creat 1.75 gfr 29  3. anemia associated with type 2 diabetes mellitus due to underlying condition: hgb 8.5 will monitor  4. Urine retention: is chronic has long term foley  5. Chronic anxiety: is currently off medications.     PREVIOUS   6. Protein calorie malnutrition severe: is stable albumin 3.4 protein 6.7  will continue supplements as directed  7. Aortic atherosclerosis (ct 01-26-21) is on asa and statin  8. Central cord syndrome at C4 level of cervical spine compression subsequent encounter/ cord compression quadriplegia C1-4 incomplete; is on asa 81 mg daily   9. Restless leg syndrome: will continue requip 1 mg nightly   10. Seizure: is stable will continue keppra 1000 mg in the AM and 1500 mg in the PM; lamictal 25 mg daily  has prn ativan; is followed by neurology   11. Type 2 diabetes mellitus with diabetic neuropathy with long term current use of insulin: hgb a1c 6.7 will continue lantus 15 units nightly novolog 5 units twice daily with meals   12. Hyperlipidemia associated with type 2 diabetes mellitus: LDL 81; will continue crestor 20 mg daily   13. Hypertension associated with type 2 diabetes mellitus: b/p 142/68 will continue hygroton 25 mg daily is off lisinopril due to renal function   14. Post menopausal osteoporosis: t score -3.391 will continue prolia every 6 months   15. GERD without esophagitis: will continue prilosec 20 mg daily is  off reglan  16. Chronic constipation: is off medications  17. Lumbar radicular syndrome/vascular necrosis bilateral hips: will continue tylenol 650 mg every 8 hours; gabapentin 300 mg three times daily  18. Chronic cough; will continue to monitor    Will check hgb A1c   [name] NP [facility]   call [phone]

## 2022-08-20 ENCOUNTER — Other Ambulatory Visit (HOSPITAL_COMMUNITY)
Admission: RE | Admit: 2022-08-20 | Discharge: 2022-08-20 | Disposition: A | Discharge status: Home / Self Care | Payer: Medicare HMO | Source: Skilled Nursing Facility | Attending: Adult Health | Admitting: Adult Health

## 2022-08-20 DIAGNOSIS — E1159 Type 2 diabetes mellitus with other circulatory complications: Secondary | ICD-10-CM | POA: Diagnosis not present

## 2022-08-21 DIAGNOSIS — E1122 Type 2 diabetes mellitus with diabetic chronic kidney disease: Secondary | ICD-10-CM | POA: Insufficient documentation

## 2022-08-21 LAB — HEMOGLOBIN A1C
Hgb A1c MFr Bld: 8.8 % — ABNORMAL HIGH (ref 4.8–5.6)
Mean Plasma Glucose: 206 mg/dL

## 2022-08-25 ENCOUNTER — Non-Acute Institutional Stay (SKILLED_NURSING_FACILITY): Payer: Medicare HMO | Admitting: Adult Health

## 2022-08-25 ENCOUNTER — Encounter: Payer: Self-pay | Admitting: Adult Health

## 2022-08-25 DIAGNOSIS — Z794 Long term (current) use of insulin: Secondary | ICD-10-CM

## 2022-08-25 DIAGNOSIS — E1140 Type 2 diabetes mellitus with diabetic neuropathy, unspecified: Secondary | ICD-10-CM

## 2022-08-25 NOTE — Progress Notes (Signed)
Location:  [facility] Room Number: 154-W Place of Service:  SNF (31)   CODE STATUS: DNR  Allergies  Allergen Reactions   Ace Inhibitors Other (See Comments)    Hyperkalemia    Codeine    Sulfa Antibiotics     Chief Complaint  Patient presents with   Acute Visit    Diabetes     HPI:  Her hgb A1c is 8.8; she is presently taking lantus; novolog twice daily with meals. Her cbg readings are 200-300's. There are no reports of excessive hunger or thirst. She is tolerating her medications without difficulty.   Past Medical History:  Diagnosis Date   Adult failure to thrive    Anemia    Anxiety    Atherosclerosis of aorta (HCC)    Central cord syndrome at C4 level of cervical spinal cord, subsequent encounter ([place])    CKD (chronic kidney disease)    stage 3   Depression    DM type 2 with diabetic peripheral neuropathy (HCC)    Dysphagia    GERD (gastroesophageal reflux disease)    Gout    High cholesterol    HTN (hypertension)    Hyponatremia    MDD (major depressive disorder)    Neck pain    Neuropathy    Quadriplegia, C1-C4 incomplete (HCC)    Radiculopathy    RLS (restless legs syndrome)    Urinary retention     Past Surgical History:  Procedure Laterality Date   ABDOMINAL HYSTERECTOMY     ANTERIOR CERVICAL DECOMP/DISCECTOMY FUSION N/A 02/05/2021   Procedure: Cervical Three-Four  Anterior cervical decompression/discectomy/fusion;  Surgeon: Ashok Pall, MD;  Location: [facility];  Service: Neurosurgery;  Laterality: N/A;  RM 20   APPENDECTOMY     BACK SURGERY     BIOPSY  09/22/2021   Procedure: BIOPSY;  Surgeon: [name], DO;  [facility];  Service: Endoscopy;;   CERVICAL DISC SURGERY     ESOPHAGOGASTRODUODENOSCOPY (EGD) WITH PROPOFOL N/A 09/22/2021   Procedure: ESOPHAGOGASTRODUODENOSCOPY (EGD) WITH PROPOFOL;  Surgeon: [name], DO;  [facility];  Service: Endoscopy;  Laterality: N/A;  1:00pm   HAND  SURGERY     KNEE SURGERY      Social History   Socioeconomic History   Marital status: Widowed    Spouse name: Not on file   Number of children: Not on file   Years of education: Not on file   Highest education level: Not on file  Occupational History   Not on file  Tobacco Use   Smoking status: Never   Smokeless tobacco: Never  Vaping Use   Vaping Use: Never used  Substance and Sexual Activity   Alcohol use: Never   Drug use: Never   Sexual activity: Never  Other Topics Concern   Not on file  Social History Narrative   Lives at [facility]   Social Determinants of Health   Financial Resource Strain: Not on file  Food Insecurity: Not on file  Transportation Needs: Not on file  Physical Activity: Not on file  Stress: Not on file  Social Connections: Not on file  Intimate Partner Violence: Not on file   Family History  Problem Relation Age of Onset   Cancer Mother    Cardiomyopathy Father    Seizures Sister        childhood   Seizures Brother        in his 27s   Colon  cancer Neg Hx       VITAL SIGNS BP 139/84   Pulse 75   Temp 97.8 F (36.6 C)   Resp 20   Ht 5\' 2"  (1.575 m)   Wt 165 lb 1.6 oz (74.9 kg)   SpO2 97%   BMI 30.20 kg/m   Outpatient Encounter Medications as of 08/25/2022  Medication Sig   acetaminophen (TYLENOL) 325 MG tablet Take 650 mg by mouth every 8 (eight) hours.   AMBULATORY NON FORMULARY MEDICATION Medication Name:  Continue monthly catheter changes with 5fr catheter at skilled nursing facility.   Artificial Saliva (BIOTENE MOISTURIZING MOUTH MT) Use as directed 1 application. in the mouth or throat at bedtime. 9 pm   aspirin EC 81 MG tablet Take 81 mg by mouth daily. Swallow whole.9 am   calcium carbonate (TUMS EX) 750 MG chewable tablet Chew 1 tablet by mouth 3 (three) times daily.   camphor-menthol (SARNA) lotion Apply 1 Application topically 2 (two) times daily as needed for itching.   denosumab (PROLIA) 60 MG/ML  SOSY injection Inject 60 mg into the skin every 6 (six) months.   DULoxetine HCl 40 MG CSDR Take 40 mg by mouth daily.   fexofenadine (ALLEGRA) 180 MG tablet Take 180 mg by mouth daily.   gabapentin (NEURONTIN) 300 MG capsule Take 300 mg by mouth 3 (three) times daily.   insulin aspart (NOVOLOG FLEXPEN) 100 UNIT/ML FlexPen Inject 5 Units into the skin 2 (two) times daily.   Insulin Pen Needle 30G X 5 MM MISC 1 Device by Does not apply route daily. 3/16"   ipratropium-albuterol (DUONEB) 0.5-2.5 (3) MG/3ML SOLN Take 3 mLs by nebulization every 6 (six) hours as needed.   LANTUS SOLOSTAR 100 UNIT/ML Solostar Pen Inject 15 Units into the skin at bedtime.   levETIRAcetam (KEPPRA) 1000 MG tablet Take 1,000 mg by mouth daily. At 9 am, see other listings   levETIRAcetam (KEPPRA) 1000 MG tablet Take 1,000 mg by mouth at bedtime. At 9 pm with 500 mg tablet for a total of 1500 mg at bedtime   levETIRAcetam (KEPPRA) 500 MG tablet Take 500 mg by mouth at bedtime. Along with 1000 mg for a total on 1500 mg in the pm (see other listing)   lidocaine (LIDODERM) 5 % Place onto the skin 2 (two) times daily as needed. Apply to Right shoulder as needed once a morning and remove at bedtime   LORazepam (ATIVAN) 2 MG/ML injection Inject 0.5 mLs (1 mg total) into the vein every 15 (fifteen) minutes as needed.   magnesium hydroxide (MILK OF MAGNESIA) 400 MG/5ML suspension Take 30 mLs by mouth daily as needed for mild constipation.   melatonin 3 MG TABS tablet Take 3 mg by mouth at bedtime.   Multiple Vitamins-Minerals (THEREMS M PO) (multivitamin with folic acid) tablet; 637 mcg; oral Once A Day;   NON FORMULARY Diet - NAS, Cons CHO   omeprazole (PRILOSEC) 20 MG capsule Take 20 mg by mouth daily.   rOPINIRole (REQUIP) 1 MG tablet Take 1 mg by mouth at bedtime.   rosuvastatin (CRESTOR) 20 MG tablet Take 20 mg by mouth daily.   tiZANidine (ZANAFLEX) 2 MG tablet Take 2 mg by mouth every 6 (six) hours as needed for muscle  spasms (for back and sciatic pain).   triamcinolone cream (KENALOG) 0.1 % Apply 1 Application topically 2 (two) times daily. 9 am and 9 pm   zinc oxide 20 % ointment Apply 1 Application topically 3 (three) times  daily as needed for irritation (apply every shift).   [DISCONTINUED] mirtazapine (REMERON) 7.5 MG tablet Take 7.5 mg by mouth at bedtime. 9 pm   [DISCONTINUED] Multiple Vitamin (THEREMS PO) Take 1 tablet by mouth daily. With Folic Acid   No facility-administered encounter medications on file as of 08/25/2022.     SIGNIFICANT DIAGNOSTIC EXAMS   PREVIOUS    09-14-21: ct of head:  No acute intracranial abnormality. 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 New left sphenoid sinus air-fluid level, which may be seen with acute sinusitis.  09-28-21: ct of head No acute intracranial findings are seen in noncontrast CT brain. Chronic sinusitis.  Bilateral mastoid effusions.  04-15-22; dexa: t score -3.391  NO NEW EXAMS     LABS REVIEWED PREVIOUS   08-29-21: wbc 5.6; hgb 9.0; hct 27.6; mcv 94.2 plt 244; glucose 106; bun 80; creat 1.35; k+ 5.8; na++ 134; ca 9.0; GFR 39 liver normal albumin 3.5 vit B 12: 446; folate 7.7; iron 79 tibc 280; ferritin 178 09-04-21: glucose 66; bun 79; creat 1.44; k+ 5.5; na++ 135; ca 9.2; GFR 36 09-14-21: wbc 6.2; hgb 8.9; hct 27.1; mcv 96.1 plt 217; glucose 171; bun 58; creat 1.69; k+ 5.8; na++ 134; ca 8.7; GFR 30; urine culture: multiple species 09-15-20: k+ 6.3 09-16-21: k+ 5.0  09-28-21: wbc 6.0; hgb 9.2; hct 27.9; mcv 95.2 plt 252; glucose 160; bun 57; creat 1.33; k+ 5.1; na++ 133; ca 8.9; GFR 40; mag 1.8; liver normal albumin 3.8  10-17-21: wbc 5.9; hgb 8.9; hct 26.6; mcv 95.3 plt 223; glucose 171; bun 44; creat 1.31;k+ 4.8; na++ 134; ca 8.6; gfr 40. D-dimer 1.21; crp 3.2  10-27-21: hgb a1c 6.5 11-04-21: CRp 21.2; d-dimer 1.46 11-24-21: wbc 5.4; hgb 8.7; hct 27.5; mcv 94.2 plt 218; sed rate 80; ana: neg; crp 0.6 01-15-22: hgb a1c 6.7 chol 148; ldl 87; trig 112;  hdl 39; tsh 5.663  02-11-22: urine culture: multiple bacteria  04-09-22: liver normal protein 6.0 albumin 3.3; chol 143; ldl 81; trig 71; hdl 48; tsh 3.502  04-27-22; wbc 6.6; hgb 8.7; hct 26.2; mcv 94.6 plt 231; glucose 152; bun 66; creat 1.26; k+ 4.7; na++ 132; ca 8.9; gfr 42; protein 6.7 albumin 3.4 hgb a1c 7.0; vitamin D 37.96  TODAY  07-09-22: wbc 9.3; hgb 8.5; hct 26.9; mcv 96.8 plt 196; glucose 200; bun 65; creat 2.15; k+ 5.0; na++ 128; ca 7.8; gfr 22 07-10-22: glucose 130; bun 59; creat 1.75; k+ 4.2; na++ 131; ca 7.6; gfr 29 07-30-22: urine micro-albumin 59.7; ACR 127   08-20-22: hgb A1c 8.8  Review of Systems  Constitutional:  Negative for malaise/fatigue.  Respiratory:  Negative for cough and shortness of breath.   Cardiovascular:  Negative for chest pain, palpitations and leg swelling.  Gastrointestinal:  Negative for abdominal pain, constipation and heartburn.  Musculoskeletal:  Negative for back pain, joint pain and myalgias.  Skin: Negative.   Neurological:  Negative for dizziness.  Psychiatric/Behavioral:  The patient is not nervous/anxious.    Physical Exam Constitutional:      General: She is not in acute distress.    Appearance: She is well-developed. She is not diaphoretic.  Neck:     Thyroid: No thyromegaly.  Cardiovascular:     Rate and Rhythm: Normal rate and regular rhythm.     Pulses: Normal pulses.     Heart sounds: Normal heart sounds.  Pulmonary:     Effort: Pulmonary effort is normal. No respiratory distress.     Breath  sounds: Normal breath sounds.  Abdominal:     General: Bowel sounds are normal. There is no distension.     Palpations: Abdomen is soft.     Tenderness: There is no abdominal tenderness.  Genitourinary:    Comments: foley Musculoskeletal:     Cervical back: Neck supple.     Right lower leg: No edema.     Left lower leg: No edema.     Comments: Does not move legs; able to move upper extremities      Lymphadenopathy:     Cervical:  No cervical adenopathy.  Skin:    General: Skin is warm and dry.  Neurological:     Mental Status: She is alert and oriented to person, place, and time.  Psychiatric:        Mood and Affect: Mood normal.        ASSESSMENT/ PLAN:  TODAY  Type 2 diabetes mellitus with diabetic neuropathy with long term current use of insulin: hgb A1c 8.8; will increase lantus to 20  units nightly and will change novolog to 5 units with all meals and will monitor    [name] NP [facility]  call [phone]

## 2022-09-07 DIAGNOSIS — Z20828 Contact with and (suspected) exposure to other viral communicable diseases: Secondary | ICD-10-CM | POA: Diagnosis not present

## 2022-09-07 DIAGNOSIS — G8252 Quadriplegia, C1-C4 incomplete: Secondary | ICD-10-CM | POA: Diagnosis not present

## 2022-09-07 DIAGNOSIS — Z1152 Encounter for screening for COVID-19: Secondary | ICD-10-CM | POA: Diagnosis not present

## 2022-09-11 ENCOUNTER — Encounter: Payer: Self-pay | Admitting: Nurse Practitioner

## 2022-09-11 ENCOUNTER — Non-Acute Institutional Stay (SKILLED_NURSING_FACILITY): Payer: Medicare HMO | Admitting: Adult Health

## 2022-09-11 ENCOUNTER — Encounter: Payer: Self-pay | Admitting: Adult Health

## 2022-09-11 DIAGNOSIS — E1122 Type 2 diabetes mellitus with diabetic chronic kidney disease: Secondary | ICD-10-CM | POA: Diagnosis not present

## 2022-09-11 DIAGNOSIS — N184 Chronic kidney disease, stage 4 (severe): Secondary | ICD-10-CM

## 2022-09-11 DIAGNOSIS — S14124S Central cord syndrome at C4 level of cervical spinal cord, sequela: Secondary | ICD-10-CM

## 2022-09-11 DIAGNOSIS — I517 Cardiomegaly: Secondary | ICD-10-CM | POA: Diagnosis not present

## 2022-09-11 DIAGNOSIS — I700 Atherosclerosis of aorta: Secondary | ICD-10-CM | POA: Diagnosis not present

## 2022-09-11 DIAGNOSIS — R062 Wheezing: Secondary | ICD-10-CM | POA: Diagnosis not present

## 2022-09-11 DIAGNOSIS — R059 Cough, unspecified: Secondary | ICD-10-CM | POA: Diagnosis not present

## 2022-09-11 NOTE — Progress Notes (Signed)
This encounter was created in error - please disregard.

## 2022-09-11 NOTE — Progress Notes (Signed)
Location:  [place] Room Number: [place] of Service:  SNF (31)   CODE STATUS: DNR  Allergies  Allergen Reactions   Ace Inhibitors Other (See Comments)    Hyperkalemia    Codeine    Sulfa Antibiotics     Chief Complaint  Patient presents with   Acute Visit    Care plan meeting     HPI:  We have come together for her care plan meeting. Family present. BIMS 15/15 mood 0/30. She is nonambulatory without falls. She requires extensive assist with 2 for bed mobility; is dependent for adl care. She has a long term foley; is incontinent of bowel. Dietary: weight is 180 pounds up 13 pounds in one month; has completed remeron therapy. Her appetite is good. Therapy: none at this time. Activities rarely attends. She is complaining of wheezing and cough. She does have central cord syndrome at C4 level. This could interfere with her ability to take deep breaths. She will continue to be followed for her chronic illnesses including: Aortic atherosclerosis CKD stage 4 due to type 2 diabetes mellitus Central cord syndrome at C4 level of cervical spine cord sequela   Past Medical History:  Diagnosis Date   Adult failure to thrive    Anemia    Anxiety    Atherosclerosis of aorta (HCC)    Central cord syndrome at C4 level of cervical spinal cord, subsequent encounter ([place])    CKD (chronic kidney disease)    stage 3   Depression    DM type 2 with diabetic peripheral neuropathy (HCC)    Dysphagia    GERD (gastroesophageal reflux disease)    Gout    High cholesterol    HTN (hypertension)    Hyponatremia    MDD (major depressive disorder)    Neck pain    Neuropathy    Quadriplegia, C1-C4 incomplete (HCC)    Radiculopathy    RLS (restless legs syndrome)    Urinary retention     Past Surgical History:  Procedure Laterality Date   ABDOMINAL HYSTERECTOMY     ANTERIOR CERVICAL DECOMP/DISCECTOMY FUSION N/A 02/05/2021   Procedure: Cervical Three-Four  Anterior  cervical decompression/discectomy/fusion;  Surgeon: Ashok Pall, MD;  Location: [facility];  Service: Neurosurgery;  Laterality: N/A;  RM 20   APPENDECTOMY     BACK SURGERY     BIOPSY  09/22/2021   Procedure: BIOPSY;  Surgeon: [name], DO;  [facility];  Service: Endoscopy;;   CERVICAL DISC SURGERY     ESOPHAGOGASTRODUODENOSCOPY (EGD) WITH PROPOFOL N/A 09/22/2021   Procedure: ESOPHAGOGASTRODUODENOSCOPY (EGD) WITH PROPOFOL;  Surgeon: [name], DO;  [facility];  Service: Endoscopy;  Laterality: N/A;  1:00pm   HAND SURGERY     KNEE SURGERY      Social History   Socioeconomic History   Marital status: Widowed    Spouse name: Not on file   Number of children: Not on file   Years of education: Not on file   Highest education level: Not on file  Occupational History   Not on file  Tobacco Use   Smoking status: Never   Smokeless tobacco: Never  Vaping Use   Vaping Use: Never used  Substance and Sexual Activity   Alcohol use: Never   Drug use: Never   Sexual activity: Never  Other Topics Concern   Not on file  Social History Narrative   Lives at [facility]  Social Determinants of Health   Financial Resource Strain: Not on file  Food Insecurity: Not on file  Transportation Needs: Not on file  Physical Activity: Not on file  Stress: Not on file  Social Connections: Not on file  Intimate Partner Violence: Not on file   Family History  Problem Relation Age of Onset   Cancer Mother    Cardiomyopathy Father    Seizures Sister        childhood   Seizures Brother        in his 37s   Colon cancer Neg Hx       VITAL SIGNS BP (!) 141/68 Comment: Taken by staff at [facility]  Pulse 77   Temp 98 F (36.7 C)   Ht 5\' 2"  (1.575 m)   Wt 177 lb (80.3 kg)   SpO2 96%   BMI 32.37 kg/m   Outpatient Encounter Medications as of 09/11/2022  Medication Sig   acetaminophen (TYLENOL) 325 MG tablet Take 650 mg by mouth every 8  (eight) hours.   AMBULATORY NON FORMULARY MEDICATION Medication Name:  Continue monthly catheter changes with 26fr catheter at skilled nursing facility.   Artificial Saliva (BIOTENE MOISTURIZING MOUTH MT) Use as directed 1 application. in the mouth or throat at bedtime. 9 pm   aspirin EC 81 MG tablet Take 81 mg by mouth daily. Swallow whole.9 am   calcium carbonate (TUMS EX) 750 MG chewable tablet Chew 1 tablet by mouth 3 (three) times daily.   camphor-menthol (SARNA) lotion Apply 1 Application topically 2 (two) times daily as needed for itching.   denosumab (PROLIA) 60 MG/ML SOSY injection Inject 60 mg into the skin every 6 (six) months.   DULoxetine (CYMBALTA) 20 MG capsule Take 40 mg by mouth daily. 9 am   fexofenadine (ALLEGRA) 180 MG tablet Take 180 mg by mouth daily.   gabapentin (NEURONTIN) 300 MG capsule Take 300 mg by mouth 3 (three) times daily.   insulin aspart (NOVOLOG FLEXPEN) 100 UNIT/ML FlexPen Inject 5 Units into the skin 3 (three) times daily with meals. 8 am, 12 pm, and 6 pm   Insulin Pen Needle 30G X 5 MM MISC 1 Device by Does not apply route daily. 3/16"   ipratropium-albuterol (DUONEB) 0.5-2.5 (3) MG/3ML SOLN Take 3 mLs by nebulization every 6 (six) hours as needed.   LANTUS SOLOSTAR 100 UNIT/ML Solostar Pen Inject 15 Units into the skin at bedtime.   levETIRAcetam (KEPPRA) 1000 MG tablet Take 1,000 mg by mouth daily. At 9 am, see other listings   levETIRAcetam (KEPPRA) 1000 MG tablet Take 1,000 mg by mouth at bedtime. At 9 pm with 500 mg tablet for a total of 1500 mg at bedtime   levETIRAcetam (KEPPRA) 500 MG tablet Take 500 mg by mouth at bedtime. Along with 1000 mg for a total on 1500 mg in the pm (see other listing)   lidocaine (LIDODERM) 5 % Place onto the skin 2 (two) times daily as needed. Apply to Right shoulder as needed once a morning and remove at bedtime   LORazepam (ATIVAN) 2 MG/ML injection Inject 0.5 mLs (1 mg total) into the vein every 15 (fifteen) minutes as  needed.   magnesium hydroxide (MILK OF MAGNESIA) 400 MG/5ML suspension Take 30 mLs by mouth daily as needed for mild constipation.   melatonin 3 MG TABS tablet Take 3 mg by mouth at bedtime.   Multiple Vitamins-Minerals (THEREMS M PO) (multivitamin with folic acid) tablet; 016 mcg; oral Once A  Day;   NON FORMULARY Diet - NAS, Cons CHO   omeprazole (PRILOSEC) 20 MG capsule Take 20 mg by mouth daily.   rOPINIRole (REQUIP) 1 MG tablet Take 1 mg by mouth at bedtime.   rosuvastatin (CRESTOR) 20 MG tablet Take 20 mg by mouth daily.   tiZANidine (ZANAFLEX) 2 MG tablet Take 2 mg by mouth every 6 (six) hours as needed for muscle spasms (for back and sciatic pain).   triamcinolone cream (KENALOG) 0.1 % Apply 1 Application topically 2 (two) times daily. 9 am and 9 pm   zinc oxide 20 % ointment Apply 1 Application topically 3 (three) times daily as needed for irritation (apply every shift).   [DISCONTINUED] DULoxetine HCl 40 MG CSDR Take 40 mg by mouth daily.   No facility-administered encounter medications on file as of 09/11/2022.     SIGNIFICANT DIAGNOSTIC EXAMS  PREVIOUS    09-14-21: ct of head:  No acute intracranial abnormality. 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 New left sphenoid sinus air-fluid level, which may be seen with acute sinusitis.  09-28-21: ct of head No acute intracranial findings are seen in noncontrast CT brain. Chronic sinusitis.  Bilateral mastoid effusions.  04-15-22; dexa: t score -3.391  NO NEW EXAMS     LABS REVIEWED PREVIOUS   09-04-21: glucose 66; bun 79; creat 1.44; k+ 5.5; na++ 135; ca 9.2; GFR 36 09-14-21: wbc 6.2; hgb 8.9; hct 27.1; mcv 96.1 plt 217; glucose 171; bun 58; creat 1.69; k+ 5.8; na++ 134; ca 8.7; GFR 30; urine culture: multiple species 09-15-20: k+ 6.3 09-16-21: k+ 5.0  09-28-21: wbc 6.0; hgb 9.2; hct 27.9; mcv 95.2 plt 252; glucose 160; bun 57; creat 1.33; k+ 5.1; na++ 133; ca 8.9; GFR 40; mag 1.8; liver normal albumin 3.8  10-17-21: wbc 5.9; hgb  8.9; hct 26.6; mcv 95.3 plt 223; glucose 171; bun 44; creat 1.31;k+ 4.8; na++ 134; ca 8.6; gfr 40. D-dimer 1.21; crp 3.2  10-27-21: hgb a1c 6.5 11-04-21: CRp 21.2; d-dimer 1.46 11-24-21: wbc 5.4; hgb 8.7; hct 27.5; mcv 94.2 plt 218; sed rate 80; ana: neg; crp 0.6 01-15-22: hgb a1c 6.7 chol 148; ldl 87; trig 112; hdl 39; tsh 5.663  02-11-22: urine culture: multiple bacteria  04-09-22: liver normal protein 6.0 albumin 3.3; chol 143; ldl 81; trig 71; hdl 48; tsh 3.502  04-27-22; wbc 6.6; hgb 8.7; hct 26.2; mcv 94.6 plt 231; glucose 152; bun 66; creat 1.26; k+ 4.7; na++ 132; ca 8.9; gfr 42; protein 6.7 albumin 3.4 hgb a1c 7.0; vitamin D 37.96 07-09-22: wbc 9.3; hgb 8.5; hct 26.9; mcv 96.8 plt 196; glucose 200; bun 65; creat 2.15; k+ 5.0; na++ 128; ca 7.8; gfr 22 07-10-22: glucose 130; bun 59; creat 1.75; k+ 4.2; na++ 131; ca 7.6; gfr 29 07-30-22: urine micro-albumin 59.7; ACR 127   08-20-22: hgb A1c 8.8  NO NEW LABS.   Review of Systems  Constitutional:  Negative for malaise/fatigue.  Respiratory:  Positive for cough and wheezing. Negative for shortness of breath.   Cardiovascular:  Negative for chest pain, palpitations and leg swelling.  Gastrointestinal:  Negative for abdominal pain, constipation and heartburn.  Musculoskeletal:  Negative for back pain, joint pain and myalgias.  Skin: Negative.   Neurological:  Negative for dizziness.  Psychiatric/Behavioral:  The patient is not nervous/anxious.     Physical Exam Constitutional:      General: She is not in acute distress.    Appearance: She is well-developed. She is obese. She is not diaphoretic.  Neck:     Thyroid: No thyromegaly.  Cardiovascular:     Rate and Rhythm: Normal rate and regular rhythm.     Pulses: Normal pulses.     Heart sounds: Normal heart sounds.  Pulmonary:     Effort: Pulmonary effort is normal. No respiratory distress.     Breath sounds: Wheezing present.     Comments: Inspiratory with faint rhonchi Abdominal:      General: Bowel sounds are normal. There is no distension.     Palpations: Abdomen is soft.     Tenderness: There is no abdominal tenderness.  Musculoskeletal:     Cervical back: Neck supple.     Right lower leg: No edema.     Left lower leg: No edema.     Comments: Does not move lower extremities.   Lymphadenopathy:     Cervical: No cervical adenopathy.  Skin:    General: Skin is warm and dry.  Neurological:     Mental Status: She is alert and oriented to person, place, and time.  Psychiatric:        Mood and Affect: Mood normal.       ASSESSMENT/ PLAN:  TODAY  Aortic atherosclerosis CKD stage 4 due to type 2 diabetes mellitus  Central cord syndrome at C4 level of cervical spine cord sequela   Will begin incentive spirometry four times daily  Will continue current medications Will continue current plan of care Will continue to monitor her status.   Time spent with patient: 40 minutes: medications; coughing; plan of care; dietary.    [name] NP [facility]  call [phone]

## 2022-09-14 DIAGNOSIS — L308 Other specified dermatitis: Secondary | ICD-10-CM | POA: Diagnosis not present

## 2022-09-15 ENCOUNTER — Non-Acute Institutional Stay (SKILLED_NURSING_FACILITY): Payer: Medicare HMO | Admitting: Adult Health

## 2022-09-15 ENCOUNTER — Encounter: Payer: Self-pay | Admitting: Adult Health

## 2022-09-15 DIAGNOSIS — R053 Chronic cough: Secondary | ICD-10-CM

## 2022-09-15 DIAGNOSIS — S14124S Central cord syndrome at C4 level of cervical spinal cord, sequela: Secondary | ICD-10-CM

## 2022-09-15 DIAGNOSIS — K219 Gastro-esophageal reflux disease without esophagitis: Secondary | ICD-10-CM

## 2022-09-15 NOTE — Progress Notes (Signed)
Location:  [place] Room Number: [place] of Service:  SNF (31)   CODE STATUS: DNR  Allergies  Allergen Reactions   Ace Inhibitors Other (See Comments)    Hyperkalemia    Codeine    Sulfa Antibiotics     Chief Complaint  Patient presents with   Acute Visit    Cough and congestion     HPI:  She has a chronic cough with some wheezing present. She has central cord syndrome at C4 level. She states that the cough is nonproductive. She does get out of bed periodically. She does not take a deep breath. There are no reports of fevers.   Past Medical History:  Diagnosis Date   Adult failure to thrive    Anemia    Anxiety    Atherosclerosis of aorta (HCC)    Central cord syndrome at C4 level of cervical spinal cord, subsequent encounter ([place])    CKD (chronic kidney disease)    stage 3   Depression    DM type 2 with diabetic peripheral neuropathy (HCC)    Dysphagia    GERD (gastroesophageal reflux disease)    Gout    High cholesterol    HTN (hypertension)    Hyponatremia    MDD (major depressive disorder)    Neck pain    Neuropathy    Quadriplegia, C1-C4 incomplete (HCC)    Radiculopathy    RLS (restless legs syndrome)    Urinary retention     Past Surgical History:  Procedure Laterality Date   ABDOMINAL HYSTERECTOMY     ANTERIOR CERVICAL DECOMP/DISCECTOMY FUSION N/A 02/05/2021   Procedure: Cervical Three-Four  Anterior cervical decompression/discectomy/fusion;  Surgeon: Ashok Pall, MD;  Location: [facility];  Service: Neurosurgery;  Laterality: N/A;  RM 20   APPENDECTOMY     BACK SURGERY     BIOPSY  09/22/2021   Procedure: BIOPSY;  Surgeon: [name], DO;  [facility];  Service: Endoscopy;;   CERVICAL DISC SURGERY     ESOPHAGOGASTRODUODENOSCOPY (EGD) WITH PROPOFOL N/A 09/22/2021   Procedure: ESOPHAGOGASTRODUODENOSCOPY (EGD) WITH PROPOFOL;  Surgeon: [name], DO;  [facility];  Service: Endoscopy;   Laterality: N/A;  1:00pm   HAND SURGERY     KNEE SURGERY      Social History   Socioeconomic History   Marital status: Widowed    Spouse name: Not on file   Number of children: Not on file   Years of education: Not on file   Highest education level: Not on file  Occupational History   Not on file  Tobacco Use   Smoking status: Never   Smokeless tobacco: Never  Vaping Use   Vaping Use: Never used  Substance and Sexual Activity   Alcohol use: Never   Drug use: Never   Sexual activity: Not Currently  Other Topics Concern   Not on file  Social History Narrative   [facility] at [facility]   Social Determinants of Health   Financial Resource Strain: Not on file  Food Insecurity: Not on file  Transportation Needs: Not on file  Physical Activity: Not on file  Stress: Not on file  Social Connections: Not on file  Intimate Partner Violence: Not on file   Family History  Problem Relation Age of Onset   Cancer Mother    Cardiomyopathy Father    Seizures Sister        childhood   Seizures Brother  in his 86s   Colon cancer Neg Hx       VITAL SIGNS BP 133/74   Pulse 69   Ht 5\' 2"  (1.575 m)   Wt 177 lb (80.3 kg)   BMI 32.37 kg/m   Outpatient Encounter Medications as of 09/15/2022  Medication Sig   acetaminophen (TYLENOL) 325 MG tablet Take 650 mg by mouth every 8 (eight) hours.   AMBULATORY NON FORMULARY MEDICATION Medication Name:  Continue monthly catheter changes with 32fr catheter at skilled nursing facility.   Artificial Saliva (BIOTENE MOISTURIZING MOUTH MT) Use as directed 1 application. in the mouth or throat at bedtime. 9 pm   aspirin EC 81 MG tablet Take 81 mg by mouth daily. Swallow whole.9 am   augmented betamethasone dipropionate (DIPROLENE-AF) 0.05 % ointment Apply 1 Application topically 2 (two) times daily as needed. For stomach and both breast (do not put on face, groin,underarms)   calcium carbonate (TUMS EX) 750 MG chewable tablet Chew  1 tablet by mouth 3 (three) times daily.   camphor-menthol (SARNA) lotion Apply 1 Application topically 2 (two) times daily as needed for itching.   denosumab (PROLIA) 60 MG/ML SOSY injection Inject 60 mg into the skin every 6 (six) months.   DULoxetine (CYMBALTA) 20 MG capsule Take 40 mg by mouth daily. 9 am   fexofenadine (ALLEGRA) 180 MG tablet Take 180 mg by mouth daily.   gabapentin (NEURONTIN) 300 MG capsule Take 300 mg by mouth 3 (three) times daily.   insulin aspart (NOVOLOG FLEXPEN) 100 UNIT/ML FlexPen Inject 5 Units into the skin 3 (three) times daily with meals. 8 am, 12 pm, and 6 pm   Insulin Pen Needle 30G X 5 MM MISC 1 Device by Does not apply route daily. 3/16"   ipratropium-albuterol (DUONEB) 0.5-2.5 (3) MG/3ML SOLN Take 3 mLs by nebulization every 6 (six) hours as needed.   LANTUS SOLOSTAR 100 UNIT/ML Solostar Pen Inject 20 Units into the skin at bedtime.   levETIRAcetam (KEPPRA) 1000 MG tablet Take 1,000 mg by mouth daily. At 9 am, see other listings   levETIRAcetam (KEPPRA) 1000 MG tablet Take 1,000 mg by mouth at bedtime. At 9 pm with 500 mg tablet for a total of 1500 mg at bedtime   levETIRAcetam (KEPPRA) 500 MG tablet Take 500 mg by mouth at bedtime. Along with 1000 mg for a total on 1500 mg in the pm (see other listing)   lidocaine (LIDODERM) 5 % Place onto the skin 2 (two) times daily as needed. Apply to Right shoulder as needed once a morning and remove at bedtime   LORazepam (ATIVAN) 2 MG/ML injection Inject 0.5 mLs (1 mg total) into the vein every 15 (fifteen) minutes as needed.   magnesium hydroxide (MILK OF MAGNESIA) 400 MG/5ML suspension Take 30 mLs by mouth daily as needed for mild constipation.   melatonin 3 MG TABS tablet Take 3 mg by mouth at bedtime.   Multiple Vitamins-Minerals (THEREMS M PO) (multivitamin with folic acid) tablet; 161 mcg; oral Once A Day;   NON FORMULARY Diet - NAS, Cons CHO   omeprazole (PRILOSEC) 20 MG capsule Take 20 mg by mouth daily.    rOPINIRole (REQUIP) 1 MG tablet Take 1 mg by mouth at bedtime.   rosuvastatin (CRESTOR) 20 MG tablet Take 20 mg by mouth daily.   tiZANidine (ZANAFLEX) 2 MG tablet Take 2 mg by mouth every 6 (six) hours as needed for muscle spasms (for back and sciatic pain).   zinc oxide  20 % ointment Apply 1 Application topically 3 (three) times daily as needed for irritation (apply every shift).   [DISCONTINUED] triamcinolone cream (KENALOG) 0.1 % Apply 1 Application topically 2 (two) times daily. 9 am and 9 pm   No facility-administered encounter medications on file as of 09/15/2022.     SIGNIFICANT DIAGNOSTIC EXAMS  PREVIOUS    09-14-21: ct of head:  No acute intracranial abnormality. 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 New left sphenoid sinus air-fluid level, which may be seen with acute sinusitis.  09-28-21: ct of head No acute intracranial findings are seen in noncontrast CT brain. Chronic sinusitis.  Bilateral mastoid effusions.  04-15-22; dexa: t score -3.391  NO NEW EXAMS     LABS REVIEWED PREVIOUS   09-04-21: glucose 66; bun 79; creat 1.44; k+ 5.5; na++ 135; ca 9.2; GFR 36 09-14-21: wbc 6.2; hgb 8.9; hct 27.1; mcv 96.1 plt 217; glucose 171; bun 58; creat 1.69; k+ 5.8; na++ 134; ca 8.7; GFR 30; urine culture: multiple species 09-15-20: k+ 6.3 09-16-21: k+ 5.0  09-28-21: wbc 6.0; hgb 9.2; hct 27.9; mcv 95.2 plt 252; glucose 160; bun 57; creat 1.33; k+ 5.1; na++ 133; ca 8.9; GFR 40; mag 1.8; liver normal albumin 3.8  10-17-21: wbc 5.9; hgb 8.9; hct 26.6; mcv 95.3 plt 223; glucose 171; bun 44; creat 1.31;k+ 4.8; na++ 134; ca 8.6; gfr 40. D-dimer 1.21; crp 3.2  10-27-21: hgb a1c 6.5 11-04-21: CRp 21.2; d-dimer 1.46 11-24-21: wbc 5.4; hgb 8.7; hct 27.5; mcv 94.2 plt 218; sed rate 80; ana: neg; crp 0.6 01-15-22: hgb a1c 6.7 chol 148; ldl 87; trig 112; hdl 39; tsh 5.663  02-11-22: urine culture: multiple bacteria  04-09-22: liver normal protein 6.0 albumin 3.3; chol 143; ldl 81; trig 71; hdl 48; tsh 3.502   04-27-22; wbc 6.6; hgb 8.7; hct 26.2; mcv 94.6 plt 231; glucose 152; bun 66; creat 1.26; k+ 4.7; na++ 132; ca 8.9; gfr 42; protein 6.7 albumin 3.4 hgb a1c 7.0; vitamin D 37.96 07-09-22: wbc 9.3; hgb 8.5; hct 26.9; mcv 96.8 plt 196; glucose 200; bun 65; creat 2.15; k+ 5.0; na++ 128; ca 7.8; gfr 22 07-10-22: glucose 130; bun 59; creat 1.75; k+ 4.2; na++ 131; ca 7.6; gfr 29 07-30-22: urine micro-albumin 59.7; ACR 127   08-20-22: hgb A1c 8.8  NO NEW LABS.   Review of Systems  Constitutional:  Negative for malaise/fatigue.  Respiratory:  Positive for cough and wheezing. Negative for sputum production and shortness of breath.   Cardiovascular:  Negative for chest pain, palpitations and leg swelling.  Gastrointestinal:  Negative for abdominal pain, constipation and heartburn.  Musculoskeletal:  Negative for back pain, joint pain and myalgias.  Skin: Negative.   Neurological:  Negative for dizziness.  Psychiatric/Behavioral:  The patient is not nervous/anxious.     Physical Exam Constitutional:      General: She is not in acute distress.    Appearance: She is well-developed. She is not diaphoretic.  Neck:     Thyroid: No thyromegaly.  Cardiovascular:     Rate and Rhythm: Normal rate and regular rhythm.     Heart sounds: Normal heart sounds.  Pulmonary:     Effort: Pulmonary effort is normal. No respiratory distress.     Comments: Wheezes at bases Diminished breath sounds  Abdominal:     General: Bowel sounds are normal. There is no distension.     Palpations: Abdomen is soft.     Tenderness: There is no abdominal tenderness.  Musculoskeletal:  Cervical back: Neck supple.     Right lower leg: No edema.     Left lower leg: No edema.     Comments: Does not move lower extremities.    Lymphadenopathy:     Cervical: No cervical adenopathy.  Skin:    General: Skin is warm and dry.  Neurological:     Mental Status: She is alert and oriented to person, place, and time.  Psychiatric:         Mood and Affect: Mood normal.       ASSESSMENT/ PLAN:  TODAY  Central cord syndrome at C4 level of cervical spine; sequela 01-25-21 Chronic cough  GERD without esophagitis  Will increase prilosec to 20 mg twice daily  Will begin mucinex twice daily for one week Will use I/S 1- breaths four times daily  Will monitor    [name] NP [facility]   call [phone]

## 2022-09-16 ENCOUNTER — Encounter: Payer: Self-pay | Admitting: Urology

## 2022-09-16 ENCOUNTER — Ambulatory Visit (INDEPENDENT_AMBULATORY_CARE_PROVIDER_SITE_OTHER): Payer: Medicare HMO | Admitting: Urology

## 2022-09-16 VITALS — BP 143/82 | HR 65

## 2022-09-16 DIAGNOSIS — R339 Retention of urine, unspecified: Secondary | ICD-10-CM | POA: Diagnosis not present

## 2022-09-16 NOTE — Patient Instructions (Signed)
Acute Urinary Retention, Female  Acute urinary retention is when a person cannot pee (urinate) at all, or can only pee a little. This can come on all of a sudden. If it is not treated, it can lead to kidney problems or other serious problems. What are the causes? A problem with the tube that drains the bladder (urethra). Problems with the nerves in the bladder. The organs in the area between your hip bones (pelvis) slipping out of place (prolapse). Tumors. The birth of a baby through the vagina. An infection. Having trouble pooping (constipation). Certain medicines. What increases the risk? Women over age 50 are more at risk. Other conditions also can increase risk. These include: Diseases, such as multiple sclerosis. Injury to the spinal cord. Diabetes. A condition that affects the way the brain works, such as dementia. Holding back urine due to trauma or because you do not want to use the bathroom. History of not being able to pee or peeing too little. Having had surgery in the area between your hip bones. What are the signs or symptoms? Trouble peeing. Pain in the lower belly. How is this treated? Treatment for this condition may include: Medicines. Placing a thin, germ-free tube (catheter) into the bladder to drain pee out of the body. Therapy to treat mental health conditions. Treatment for conditions that may cause this. If needed, you may be treated in the hospital for kidney problems or to manage other problems. Follow these instructions at home: Medicines Take over-the-counter and prescription medicines only as told by your doctor. Ask your doctor what medicines you should stay away from. If you were given an antibiotic medicine, take it as told by your doctor. Do not stop taking it even if you start to feel better. General instructions Do not smoke or use any products that contain nicotine or tobacco. If you need help quitting, ask your doctor. Drink enough fluid to  keep your pee pale yellow. If you were sent home with a tube that drains the bladder, take care of it as told by your doctor. Watch for changes in your symptoms. Tell your doctor about them. If told, keep track of changes in your blood pressure at home. Tell your doctor about them. Keep all follow-up visits. Contact a doctor if: You have spasms in your bladder that you cannot stop. You leak pee when you have spasms. Get help right away if: You have chills or a fever. You have blood in your pee. You have a tube that drains pee from the bladder and these things happen: The tube stops draining pee. The tube falls out. Summary Acute urinary retention is when you cannot pee at all or you pee too little. If this is not treated, it can cause kidney problems or other serious problems. If you were sent home with a tube (catheter) that drains pee from the bladder, take care of it as told by your doctor. Watch for changes in your symptoms. Tell your doctor about them. This information is not intended to replace advice given to you by your health care provider. Make sure you discuss any questions you have with your health care provider. Document Revised: 05/08/2020 Document Reviewed: 05/08/2020 Elsevier Patient Education  2023 Elsevier Inc.  

## 2022-09-16 NOTE — Progress Notes (Signed)
09/16/2022 10:36 AM   [name] April 12, 1936 [id number]  Referring provider: Gerlene Fee, NP [place],  [place] [address]  Followup urinary retention   HPI: [name] is a 87yo here for followup for urinary retention and chronic indwelling foley. She has monthly foley changes. No UTIs in the past year. She denies any issues with the leakage around the catheter. She is confined to a wheelchair.    PMH: Past Medical History:  Diagnosis Date   Adult failure to thrive    Anemia    Anxiety    Atherosclerosis of aorta (HCC)    Central cord syndrome at C4 level of cervical spinal cord, subsequent encounter ([place])    CKD (chronic kidney disease)    stage 3   Depression    DM type 2 with diabetic peripheral neuropathy (HCC)    Dysphagia    GERD (gastroesophageal reflux disease)    Gout    High cholesterol    HTN (hypertension)    Hyponatremia    MDD (major depressive disorder)    Neck pain    Neuropathy    Quadriplegia, C1-C4 incomplete (HCC)    Radiculopathy    RLS (restless legs syndrome)    Urinary retention     Surgical History: Past Surgical History:  Procedure Laterality Date   ABDOMINAL HYSTERECTOMY     ANTERIOR CERVICAL DECOMP/DISCECTOMY FUSION N/A 02/05/2021   Procedure: Cervical Three-Four  Anterior cervical decompression/discectomy/fusion;  Surgeon: Ashok Pall, MD;  Location: [place];  Service: Neurosurgery;  Laterality: N/A;  RM 20   APPENDECTOMY     BACK SURGERY     BIOPSY  09/22/2021   Procedure: BIOPSY;  Surgeon: [name], DO;  [facility];  Service: Endoscopy;;   CERVICAL DISC SURGERY     ESOPHAGOGASTRODUODENOSCOPY (EGD) WITH PROPOFOL N/A 09/22/2021   Procedure: ESOPHAGOGASTRODUODENOSCOPY (EGD) WITH PROPOFOL;  Surgeon: [name], DO;  [facility];  Service: Endoscopy;  Laterality: N/A;  1:00pm   HAND SURGERY     KNEE SURGERY      Home Medications:  Allergies as of 09/16/2022        Reactions   Ace Inhibitors Other (See Comments)   Hyperkalemia    Codeine    Sulfa Antibiotics         Medication List      Notice   This visit is during an admission. Changes to the med list made in this visit will be reflected in the After Visit Summary of the admission.     Allergies:  Allergies  Allergen Reactions   Ace Inhibitors Other (See Comments)    Hyperkalemia    Codeine    Sulfa Antibiotics     Family History: Family History  Problem Relation Age of Onset   Cancer Mother    Cardiomyopathy Father    Seizures Sister        childhood   Seizures Brother        in his 83s   Colon cancer Neg Hx     Social History:  reports that she has never smoked. She has never used smokeless tobacco. She reports that she does not drink alcohol and does not use drugs.  ROS: All other review of systems were reviewed and are negative except what is noted above in HPI  Physical Exam: BP (!) 143/82   Pulse 65   Constitutional:  Alert and oriented, No acute distress. HEENT: [place] AT, moist mucus  membranes.  Trachea midline, no masses. Cardiovascular: No clubbing, cyanosis, or edema. Respiratory: Normal respiratory effort, no increased work of breathing. GI: Abdomen is soft, nontender, nondistended, no abdominal masses GU: No CVA tenderness.  Lymph: No cervical or inguinal lymphadenopathy. Skin: No rashes, bruises or suspicious lesions. Neurologic: Grossly intact, no focal deficits, moving all 4 extremities. Psychiatric: Normal mood and affect.  Laboratory Data: Lab Results  Component Value Date   WBC 9.3 07/09/2022   HGB 8.5 (L) 07/09/2022   HCT 26.9 (L) 07/09/2022   MCV 96.8 07/09/2022   PLT 196 07/09/2022    Lab Results  Component Value Date   CREATININE 1.75 (H) 07/10/2022    No results found for: "PSA"  No results found for: "TESTOSTERONE"  Lab Results  Component Value Date   HGBA1C 8.8 (H) 08/20/2022    Urinalysis    Component Value Date/Time    COLORURINE RED (A) 02/11/2022 1300   APPEARANCEUR TURBID (A) 02/11/2022 1300   LABSPEC  02/11/2022 1300    TEST NOT REPORTED DUE TO COLOR INTERFERENCE OF URINE PIGMENT   PHURINE  02/11/2022 1300    TEST NOT REPORTED DUE TO COLOR INTERFERENCE OF URINE PIGMENT   GLUCOSEU (A) 02/11/2022 1300    TEST NOT REPORTED DUE TO COLOR INTERFERENCE OF URINE PIGMENT   HGBUR (A) 02/11/2022 1300    TEST NOT REPORTED DUE TO COLOR INTERFERENCE OF URINE PIGMENT   BILIRUBINUR (A) 02/11/2022 1300    TEST NOT REPORTED DUE TO COLOR INTERFERENCE OF URINE PIGMENT   KETONESUR (A) 02/11/2022 1300    TEST NOT REPORTED DUE TO COLOR INTERFERENCE OF URINE PIGMENT   PROTEINUR (A) 02/11/2022 1300    TEST NOT REPORTED DUE TO COLOR INTERFERENCE OF URINE PIGMENT   NITRITE (A) 02/11/2022 1300    TEST NOT REPORTED DUE TO COLOR INTERFERENCE OF URINE PIGMENT   LEUKOCYTESUR (A) 02/11/2022 1300    TEST NOT REPORTED DUE TO COLOR INTERFERENCE OF URINE PIGMENT    Lab Results  Component Value Date   BACTERIA NONE SEEN 02/11/2022    Pertinent Imaging:  No results found for this or any previous visit.  No results found for this or any previous visit.  No results found for this or any previous visit.  No results found for this or any previous visit.  Results for orders placed during the hospital encounter of 01/26/21  US RENAL  Narrative CLINICAL DATA:  Acute kidney injury.  EXAM: RENAL / URINARY TRACT ULTRASOUND COMPLETE  COMPARISON:  CT abdomen pelvis dated Jan 26, 2021.  FINDINGS: Right Kidney:  Renal measurements: 10.6 x 5.1 x 5.6 cm = volume: 157 mL. Echogenicity within normal limits. No mass or hydronephrosis visualized. Small amount of perinephric fluid.  Left Kidney:  Renal measurements: 10.4 x 6.2 x 7.0 cm = volume: 232 mL. Echogenicity within normal limits. No mass or hydronephrosis visualized.  Bladder:  Decompressed by Foley catheter.  Other:  None.  IMPRESSION: 1. No acute  abnormality. Small amount of right perinephric fluid, nonspecific.   Electronically Signed By: Titus Dubin M.D. On: 01/30/2021 11:15  No valid procedures specified. No results found for this or any previous visit.  No results found for this or any previous visit.   Assessment & Plan:    1. Urinary retention -Continue chronic indwelling foley. Continue to change foley monthly -followup 1 year.    No follow-ups on file.  Nicolette Bang, MD  [facility] [place]

## 2022-09-21 ENCOUNTER — Encounter: Payer: Self-pay | Admitting: Adult Health

## 2022-09-21 ENCOUNTER — Non-Acute Institutional Stay (SKILLED_NURSING_FACILITY): Payer: Medicare HMO | Admitting: Adult Health

## 2022-09-21 DIAGNOSIS — G8252 Quadriplegia, C1-C4 incomplete: Secondary | ICD-10-CM | POA: Diagnosis not present

## 2022-09-21 DIAGNOSIS — E43 Unspecified severe protein-calorie malnutrition: Secondary | ICD-10-CM | POA: Diagnosis not present

## 2022-09-21 DIAGNOSIS — I700 Atherosclerosis of aorta: Secondary | ICD-10-CM

## 2022-09-21 DIAGNOSIS — S14124S Central cord syndrome at C4 level of cervical spinal cord, sequela: Secondary | ICD-10-CM

## 2022-09-21 NOTE — Progress Notes (Signed)
Location:  [place] Room Number: NO/154/W Place of Service:  SNF (31) [name] S.,NP  CODE STATUS: DNR  Allergies  Allergen Reactions   Ace Inhibitors Other (See Comments)    Hyperkalemia    Codeine    Sulfa Antibiotics     Chief Complaint  Patient presents with   Medical Management of Chronic Issues                       Protein calorie malnutrition severe:  Aortic atherosclerosis   Central cord syndrome at C4 level of cervical spine compression subsequent encounter/cord compression quadriplegia  C1-4 incomplete Restless leg syndrome    HPI:  She is a 87 year old long term resident of this facility being seen for the management of her chronic illnesses: Protein calorie malnutrition severe:  Aortic atherosclerosis   Central cord syndrome at C4 level of cervical spine compression subsequent encounter/cord compression quadriplegia  C1-4 incomplete Restless leg syndrome. There are no reports of uncontrolled pain. No reports of hypoglycemia. No reports of changes in appetite .  Past Medical History:  Diagnosis Date   Adult failure to thrive    Anemia    Anxiety    Atherosclerosis of aorta (HCC)    Central cord syndrome at C4 level of cervical spinal cord, subsequent encounter ([place])    CKD (chronic kidney disease)    stage 3   Depression    DM type 2 with diabetic peripheral neuropathy (HCC)    Dysphagia    GERD (gastroesophageal reflux disease)    Gout    High cholesterol    HTN (hypertension)    Hyponatremia    MDD (major depressive disorder)    Neck pain    Neuropathy    Quadriplegia, C1-C4 incomplete (HCC)    Radiculopathy    RLS (restless legs syndrome)    Urinary retention     Past Surgical History:  Procedure Laterality Date   ABDOMINAL HYSTERECTOMY     ANTERIOR CERVICAL DECOMP/DISCECTOMY FUSION N/A 02/05/2021   Procedure: Cervical Three-Four  Anterior cervical decompression/discectomy/fusion;  Surgeon: Ashok Pall, MD;   Location: [place];  Service: Neurosurgery;  Laterality: N/A;  RM 20   APPENDECTOMY     BACK SURGERY     BIOPSY  09/22/2021   Procedure: BIOPSY;  Surgeon: [name], DO;  [facility];  Service: Endoscopy;;   CERVICAL DISC SURGERY     ESOPHAGOGASTRODUODENOSCOPY (EGD) WITH PROPOFOL N/A 09/22/2021   Procedure: ESOPHAGOGASTRODUODENOSCOPY (EGD) WITH PROPOFOL;  Surgeon: [name], DO;  [facility];  Service: Endoscopy;  Laterality: N/A;  1:00pm   HAND SURGERY     KNEE SURGERY      Social History   Socioeconomic History   Marital status: Widowed    Spouse name: Not on file   Number of children: Not on file   Years of education: Not on file   Highest education level: Not on file  Occupational History   Not on file  Tobacco Use   Smoking status: Never   Smokeless tobacco: Never  Vaping Use   Vaping Use: Never used  Substance and Sexual Activity   Alcohol use: Never   Drug use: Never   Sexual activity: Not Currently  Other Topics Concern   Not on file  Social History Narrative   Lives at [facility]   Social Determinants of Health   Financial Resource Strain: Not on file  Food Insecurity:  Not on file  Transportation Needs: Not on file  Physical Activity: Not on file  Stress: Not on file  Social Connections: Not on file  Intimate Partner Violence: Not on file   Family History  Problem Relation Age of Onset   Cancer Mother    Cardiomyopathy Father    Seizures Sister        childhood   Seizures Brother        in his 39s   Colon cancer Neg Hx       VITAL SIGNS BP 137/70   Pulse 88   Temp (!) 97.4 F (36.3 C)   Resp 20   Ht 5\' 2"  (1.575 m)   Wt 177 lb 9.6 oz (80.6 kg)   SpO2 96%   BMI 32.48 kg/m   Outpatient Encounter Medications as of 09/21/2022  Medication Sig   acetaminophen (TYLENOL) 325 MG tablet Take 650 mg by mouth every 8 (eight) hours.   AMBULATORY NON FORMULARY MEDICATION Medication Name:  Continue monthly  catheter changes with 30fr catheter at skilled nursing facility.   Artificial Saliva (BIOTENE MOISTURIZING MOUTH MT) Use as directed 1 application. in the mouth or throat at bedtime. 9 pm   aspirin EC 81 MG tablet Take 81 mg by mouth daily. Swallow whole.9 am   augmented betamethasone dipropionate (DIPROLENE-AF) 0.05 % ointment Apply 1 Application topically 2 (two) times daily as needed. For stomach and both breast (do not put on face, groin,underarms)   calcium carbonate (TUMS EX) 750 MG chewable tablet Chew 1 tablet by mouth 3 (three) times daily.   camphor-menthol (SARNA) lotion Apply 1 Application topically 2 (two) times daily as needed for itching.   denosumab (PROLIA) 60 MG/ML SOSY injection Inject 60 mg into the skin every 6 (six) months.   DULoxetine (CYMBALTA) 20 MG capsule Take 40 mg by mouth daily. 9 am   fexofenadine (ALLEGRA) 180 MG tablet Take 180 mg by mouth daily.   gabapentin (NEURONTIN) 300 MG capsule Take 300 mg by mouth 3 (three) times daily.   insulin aspart (NOVOLOG FLEXPEN) 100 UNIT/ML FlexPen Inject 5 Units into the skin 3 (three) times daily with meals. 8 am, 12 pm, and 6 pm   Insulin Pen Needle 30G X 5 MM MISC 1 Device by Does not apply route daily. 3/16"   ipratropium-albuterol (DUONEB) 0.5-2.5 (3) MG/3ML SOLN Take 3 mLs by nebulization every 6 (six) hours as needed.   LANTUS SOLOSTAR 100 UNIT/ML Solostar Pen Inject 20 Units into the skin at bedtime.   levETIRAcetam (KEPPRA) 1000 MG tablet Take 1,000 mg by mouth daily. At 9 am, see other listings   levETIRAcetam (KEPPRA) 1000 MG tablet Take 1,000 mg by mouth at bedtime. At 9 pm with 500 mg tablet for a total of 1500 mg at bedtime   levETIRAcetam (KEPPRA) 500 MG tablet Take 500 mg by mouth at bedtime. Along with 1000 mg for a total on 1500 mg in the pm (see other listing)   lidocaine (LIDODERM) 5 % Place onto the skin 2 (two) times daily as needed. Apply to Right shoulder as needed once a morning and remove at bedtime    LORazepam (ATIVAN) 2 MG/ML injection Inject 0.5 mLs (1 mg total) into the vein every 15 (fifteen) minutes as needed.   magnesium hydroxide (MILK OF MAGNESIA) 400 MG/5ML suspension Take 30 mLs by mouth daily as needed for mild constipation.   melatonin 3 MG TABS tablet Take 3 mg by mouth at bedtime.   Multiple  Vitamins-Minerals (THEREMS M PO) (multivitamin with folic acid) tablet; 130 mcg; oral Once A Day;   NON FORMULARY Diet - NAS, Cons CHO   omeprazole (PRILOSEC) 20 MG capsule Take 20 mg by mouth daily.   rOPINIRole (REQUIP) 1 MG tablet Take 1 mg by mouth at bedtime.   rosuvastatin (CRESTOR) 20 MG tablet Take 20 mg by mouth daily.   tiZANidine (ZANAFLEX) 2 MG tablet Take 2 mg by mouth every 6 (six) hours as needed for muscle spasms (for back and sciatic pain).   zinc oxide 20 % ointment Apply 1 Application topically 3 (three) times daily as needed for irritation (apply every shift).   No facility-administered encounter medications on file as of 09/21/2022.     SIGNIFICANT DIAGNOSTIC EXAMS   PREVIOUS    09-14-21: ct of head:  No acute intracranial abnormality. 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 New left sphenoid sinus air-fluid level, which may be seen with acute sinusitis.  09-28-21: ct of head No acute intracranial findings are seen in noncontrast CT brain. Chronic sinusitis.  Bilateral mastoid effusions.  04-15-22; dexa: t score -3.391  NO NEW EXAMS     LABS REVIEWED PREVIOUS   09-04-21: glucose 66; bun 79; creat 1.44; k+ 5.5; na++ 135; ca 9.2; GFR 36 09-14-21: wbc 6.2; hgb 8.9; hct 27.1; mcv 96.1 plt 217; glucose 171; bun 58; creat 1.69; k+ 5.8; na++ 134; ca 8.7; GFR 30; urine culture: multiple species 09-15-20: k+ 6.3 09-16-21: k+ 5.0  09-28-21: wbc 6.0; hgb 9.2; hct 27.9; mcv 95.2 plt 252; glucose 160; bun 57; creat 1.33; k+ 5.1; na++ 133; ca 8.9; GFR 40; mag 1.8; liver normal albumin 3.8  10-17-21: wbc 5.9; hgb 8.9; hct 26.6; mcv 95.3 plt 223; glucose 171; bun 44; creat  1.31;k+ 4.8; na++ 134; ca 8.6; gfr 40. D-dimer 1.21; crp 3.2  10-27-21: hgb a1c 6.5 11-04-21: CRp 21.2; d-dimer 1.46 11-24-21: wbc 5.4; hgb 8.7; hct 27.5; mcv 94.2 plt 218; sed rate 80; ana: neg; crp 0.6 01-15-22: hgb a1c 6.7 chol 148; ldl 87; trig 112; hdl 39; tsh 5.663  02-11-22: urine culture: multiple bacteria  04-09-22: liver normal protein 6.0 albumin 3.3; chol 143; ldl 81; trig 71; hdl 48; tsh 3.502  04-27-22; wbc 6.6; hgb 8.7; hct 26.2; mcv 94.6 plt 231; glucose 152; bun 66; creat 1.26; k+ 4.7; na++ 132; ca 8.9; gfr 42; protein 6.7 albumin 3.4 hgb a1c 7.0; vitamin D 37.96 07-09-22: wbc 9.3; hgb 8.5; hct 26.9; mcv 96.8 plt 196; glucose 200; bun 65; creat 2.15; k+ 5.0; na++ 128; ca 7.8; gfr 22 07-10-22: glucose 130; bun 59; creat 1.75; k+ 4.2; na++ 131; ca 7.6; gfr 29 07-30-22: urine micro-albumin 59.7; ACR 127   08-20-22: hgb A1c 8.8  NO NEW LABS.   Review of Systems  Constitutional:  Negative for malaise/fatigue.  Respiratory:  Negative for cough and shortness of breath.   Cardiovascular:  Negative for chest pain, palpitations and leg swelling.  Gastrointestinal:  Negative for abdominal pain, constipation and heartburn.  Musculoskeletal:  Negative for back pain, joint pain and myalgias.  Skin: Negative.   Neurological:  Negative for dizziness.  Psychiatric/Behavioral:  The patient is not nervous/anxious.    Physical Exam Constitutional:      General: She is not in acute distress.    Appearance: She is well-developed. She is not diaphoretic.  Neck:     Thyroid: No thyromegaly.  Cardiovascular:     Rate and Rhythm: Normal rate and regular rhythm.  Pulses: Normal pulses.     Heart sounds: Normal heart sounds.  Pulmonary:     Effort: Pulmonary effort is normal. No respiratory distress.     Comments: Diminished in the bases  Abdominal:     General: Bowel sounds are normal. There is no distension.     Palpations: Abdomen is soft.     Tenderness: There is no abdominal tenderness.   Musculoskeletal:     Cervical back: Neck supple.     Right lower leg: No edema.     Left lower leg: No edema.     Comments: Does not move lower extremities   Lymphadenopathy:     Cervical: No cervical adenopathy.  Skin:    General: Skin is warm and dry.  Neurological:     Mental Status: She is alert and oriented to person, place, and time.  Psychiatric:        Mood and Affect: Mood normal.       ASSESSMENT/ PLAN:  TODAY  Protein calorie malnutrition severe: albumin 3.4; protein 6.7 will continue supplements as directed  2. Aortic atherosclerosis (ct 01-26-21) is on asa and statin  3. Central cord syndrome at C4 level of cervical spine compression subsequent encounter/cord compression quadriplegia  C1-4 incomplete (01-25-21); asa 81 mg daily has zanaflex 2 mg every 6 hours as needed for spasticity  4. Restless leg syndrome: will continue requip 1 mg nightly    PREVIOUS   5. Seizure: is stable will continue keppra 1000 mg in the AM and 1500 mg in the PM;   has prn ativan; is followed by neurology   6. Type 2 diabetes mellitus with diabetic neuropathy with long term current use of insulin: hgb a1c  will continue lantus 20 units nightly novolog 5 units twice daily with meals   7. Hyperlipidemia associated with type 2 diabetes mellitus: LDL 81; will continue crestor 20 mg daily   8. Hypertension associated with type 2 diabetes mellitus: b/p 137/70 will continue hygroton 25 mg daily is off lisinopril due to renal function   9 Post menopausal osteoporosis: t score -3.391 will continue prolia every 6 months on ca++   10. GERD without esophagitis: will continue prilosec 20 mg daily is off reglan  11. Chronic constipation: is off medications  12. Lumbar radicular syndrome/vascular necrosis bilateral hips: will continue tylenol 650 mg every 8 hours; gabapentin 300 mg three times daily cymbalta 40 mg daily   13. Chronic cough; will continue I/S four times daily    14. Diabetic  peripheral neuropathy: will continue gabapentin 300 mg three times daily  15. CKD stage 4 due to type 2 diabetes mellitus: bun 59; creat 1.75 gfr 29  16. anemia associated with type 2 diabetes mellitus due to underlying condition: hgb 8.5 will monitor  17. Urine retention: is chronic has long term foley  18. Chronic anxiety: is currently off medications.      [name] NP [facility]   call [phone]

## 2022-09-29 DIAGNOSIS — M4802 Spinal stenosis, cervical region: Secondary | ICD-10-CM | POA: Diagnosis not present

## 2022-10-01 ENCOUNTER — Non-Acute Institutional Stay (SKILLED_NURSING_FACILITY): Payer: Medicare HMO | Admitting: Adult Health

## 2022-10-01 ENCOUNTER — Emergency Department (HOSPITAL_COMMUNITY): Payer: Medicare HMO

## 2022-10-01 ENCOUNTER — Emergency Department (HOSPITAL_COMMUNITY)
Admission: EM | Admit: 2022-10-01 | Discharge: 2022-10-02 | Disposition: A | Discharge status: Skilled Nursing Facility | Payer: Medicare HMO | Attending: Emergency Medicine | Admitting: Emergency Medicine

## 2022-10-01 ENCOUNTER — Encounter: Payer: Self-pay | Admitting: Adult Health

## 2022-10-01 DIAGNOSIS — N1330 Unspecified hydronephrosis: Secondary | ICD-10-CM | POA: Diagnosis not present

## 2022-10-01 DIAGNOSIS — Z7984 Long term (current) use of oral hypoglycemic drugs: Secondary | ICD-10-CM | POA: Diagnosis not present

## 2022-10-01 DIAGNOSIS — E1122 Type 2 diabetes mellitus with diabetic chronic kidney disease: Secondary | ICD-10-CM | POA: Diagnosis not present

## 2022-10-01 DIAGNOSIS — N134 Hydroureter: Secondary | ICD-10-CM | POA: Diagnosis not present

## 2022-10-01 DIAGNOSIS — R739 Hyperglycemia, unspecified: Secondary | ICD-10-CM

## 2022-10-01 DIAGNOSIS — N183 Chronic kidney disease, stage 3 unspecified: Secondary | ICD-10-CM | POA: Diagnosis not present

## 2022-10-01 DIAGNOSIS — I129 Hypertensive chronic kidney disease with stage 1 through stage 4 chronic kidney disease, or unspecified chronic kidney disease: Secondary | ICD-10-CM | POA: Diagnosis not present

## 2022-10-01 DIAGNOSIS — K5900 Constipation, unspecified: Secondary | ICD-10-CM | POA: Diagnosis not present

## 2022-10-01 DIAGNOSIS — R109 Unspecified abdominal pain: Secondary | ICD-10-CM

## 2022-10-01 DIAGNOSIS — E1165 Type 2 diabetes mellitus with hyperglycemia: Secondary | ICD-10-CM | POA: Diagnosis not present

## 2022-10-01 DIAGNOSIS — I10 Essential (primary) hypertension: Secondary | ICD-10-CM | POA: Diagnosis not present

## 2022-10-01 DIAGNOSIS — R1030 Lower abdominal pain, unspecified: Secondary | ICD-10-CM | POA: Diagnosis not present

## 2022-10-01 DIAGNOSIS — E1142 Type 2 diabetes mellitus with diabetic polyneuropathy: Secondary | ICD-10-CM | POA: Insufficient documentation

## 2022-10-01 DIAGNOSIS — J209 Acute bronchitis, unspecified: Secondary | ICD-10-CM | POA: Insufficient documentation

## 2022-10-01 DIAGNOSIS — Z794 Long term (current) use of insulin: Secondary | ICD-10-CM | POA: Diagnosis not present

## 2022-10-01 DIAGNOSIS — Z7982 Long term (current) use of aspirin: Secondary | ICD-10-CM | POA: Insufficient documentation

## 2022-10-01 DIAGNOSIS — R1084 Generalized abdominal pain: Secondary | ICD-10-CM

## 2022-10-01 DIAGNOSIS — J9811 Atelectasis: Secondary | ICD-10-CM | POA: Diagnosis not present

## 2022-10-01 LAB — COMPREHENSIVE METABOLIC PANEL
ALT: 17 U/L (ref 0–44)
AST: 21 U/L (ref 15–41)
Albumin: 3.5 g/dL (ref 3.5–5.0)
Alkaline Phosphatase: 61 U/L (ref 38–126)
Anion gap: 12 (ref 5–15)
BUN: 43 mg/dL — ABNORMAL HIGH (ref 8–23)
CO2: 16 mmol/L — ABNORMAL LOW (ref 22–32)
Calcium: 8.2 mg/dL — ABNORMAL LOW (ref 8.9–10.3)
Chloride: 107 mmol/L (ref 98–111)
Creatinine, Ser: 1.69 mg/dL — ABNORMAL HIGH (ref 0.44–1.00)
GFR, Estimated: 30 mL/min — ABNORMAL LOW (ref >60)
Glucose, Bld: 404 mg/dL — ABNORMAL HIGH (ref 70–99)
Potassium: 5.2 mmol/L — ABNORMAL HIGH (ref 3.5–5.1)
Sodium: 135 mmol/L (ref 135–145)
Total Bilirubin: 0.3 mg/dL (ref 0.3–1.2)
Total Protein: 7.2 g/dL (ref 6.5–8.1)

## 2022-10-01 LAB — CBC WITH DIFFERENTIAL/PLATELET
Abs Immature Granulocytes: 0.07 10*3/uL (ref 0.00–0.07)
Basophils Absolute: 0 10*3/uL (ref 0.0–0.1)
Basophils Relative: 0 %
Eosinophils Absolute: 0 10*3/uL (ref 0.0–0.5)
Eosinophils Relative: 0 %
HCT: 29.9 % — ABNORMAL LOW (ref 36.0–46.0)
Hemoglobin: 9.6 g/dL — ABNORMAL LOW (ref 12.0–15.0)
Immature Granulocytes: 1 %
Lymphocytes Relative: 16 %
Lymphs Abs: 0.9 10*3/uL (ref 0.7–4.0)
MCH: 30.5 pg (ref 26.0–34.0)
MCHC: 32.1 g/dL (ref 30.0–36.0)
MCV: 94.9 fL (ref 80.0–100.0)
Monocytes Absolute: 0 10*3/uL — ABNORMAL LOW (ref 0.1–1.0)
Monocytes Relative: 1 %
Neutro Abs: 4.6 10*3/uL (ref 1.7–7.7)
Neutrophils Relative %: 82 %
Platelets: 245 10*3/uL (ref 150–400)
RBC: 3.15 MIL/uL — ABNORMAL LOW (ref 3.87–5.11)
RDW: 13.2 % (ref 11.5–15.5)
WBC: 5.6 10*3/uL (ref 4.0–10.5)
nRBC: 0 % (ref 0.0–0.2)

## 2022-10-01 LAB — URINALYSIS, ROUTINE W REFLEX MICROSCOPIC
Bilirubin Urine: NEGATIVE
Glucose, UA: >=500 mg/dL — AB
Ketones, ur: NEGATIVE mg/dL
Nitrite: NEGATIVE
Protein, ur: 100 mg/dL — AB
RBC / HPF: >50 RBC/hpf (ref 0–5)
Specific Gravity, Urine: 1.015 (ref 1.005–1.030)
pH: 5 (ref 5.0–8.0)

## 2022-10-01 LAB — CBG MONITORING, ED: Glucose-Capillary: 404 mg/dL — ABNORMAL HIGH (ref 70–99)

## 2022-10-01 MED ORDER — IOHEXOL 300 MG/ML  SOLN
75.0000 mL | Freq: Once | INTRAMUSCULAR | Status: AC | PRN
  Administered 2022-10-01: 75 mL via INTRAVENOUS

## 2022-10-01 MED ORDER — INSULIN ASPART 100 UNIT/ML IJ SOLN
10.0000 [IU] | Freq: Once | INTRAMUSCULAR | Status: AC
  Administered 2022-10-01: 10 [IU] via INTRAVENOUS
  Filled 2022-10-01: qty 1

## 2022-10-01 MED ORDER — FENTANYL CITRATE PF 50 MCG/ML IJ SOSY
50.0000 ug | PREFILLED_SYRINGE | Freq: Once | INTRAMUSCULAR | Status: AC
  Administered 2022-10-01: 50 ug via INTRAVENOUS
  Filled 2022-10-01: qty 1

## 2022-10-01 NOTE — ED Notes (Signed)
This RN and two ED NT assisted pt with soap suds enema, pt handle it well, several incontinent pads and a new brief applied under pt, pt is unable to turn self, maximum assist needed. Enema explained to pt and daughter, advised will regularly check on pt for linens, pads, and brief change after administration of enema, both verbalized understanding.

## 2022-10-01 NOTE — ED Notes (Signed)
Peri care performed with this RN and RN Jacquelyn before catheter removal and insertion of new cath as pt had a medium size BM, incontinent pad and brief changed as well

## 2022-10-01 NOTE — ED Triage Notes (Signed)
Pt arrived via [facility] from [place] [facility] c/o abdominal pain, vaginal pain, rectal pain x 1 week, pt is quadriplegic, also c/o of chronic cough.

## 2022-10-01 NOTE — Progress Notes (Signed)
Location:  [place] Room Number: 154-W Place of Service:  SNF (31)   CODE STATUS: dnr   Allergies  Allergen Reactions  . Ace Inhibitors Other (See Comments)    Hyperkalemia   . Codeine   . Sulfa Antibiotics     Chief Complaint  Patient presents with  . Acute Visit    Uncontrolled cough    HPI:  She has had a chronic cough for a prolonged period of time. More than likely this cough is related to her C4 injury in 2022. Today she does have some wheezing present; and is unable to stop coughing. She denies any choking. There have been no reports of fevers. She is also complaining of abdominal pain and "rectum" pain. She denies any nausea or vomiting; no constipation or diarrhea. She states that she has had the abdominal pain for more than one week.   Past Medical History:  Diagnosis Date  . Adult failure to thrive   . Anemia   . Anxiety   . Atherosclerosis of aorta ([place])   . Central cord syndrome at C4 level of cervical spinal cord, subsequent encounter ([place])   . CKD (chronic kidney disease)    stage 3  . Depression   . DM type 2 with diabetic peripheral neuropathy ([place])   . Dysphagia   . GERD (gastroesophageal reflux disease)   . Gout   . High cholesterol   . HTN (hypertension)   . Hyponatremia   . MDD (major depressive disorder)   . Neck pain   . Neuropathy   . Quadriplegia, C1-C4 incomplete ([place])   . Radiculopathy   . RLS (restless legs syndrome)   . Urinary retention     Past Surgical History:  Procedure Laterality Date  . ABDOMINAL HYSTERECTOMY    . ANTERIOR CERVICAL DECOMP/DISCECTOMY FUSION N/A 02/05/2021   Procedure: Cervical Three-Four  Anterior cervical decompression/discectomy/fusion;  Surgeon: Ashok Pall, MD;  Location: [place];  Service: Neurosurgery;  Laterality: N/A;  RM 20  . APPENDECTOMY    . BACK SURGERY    . BIOPSY  09/22/2021   Procedure: BIOPSY;  Surgeon: [name], DO;  [facility];  Service:  Endoscopy;;  . CERVICAL DISC SURGERY    . ESOPHAGOGASTRODUODENOSCOPY (EGD) WITH PROPOFOL N/A 09/22/2021   Procedure: ESOPHAGOGASTRODUODENOSCOPY (EGD) WITH PROPOFOL;  Surgeon: [name], DO;  [facility];  Service: Endoscopy;  Laterality: N/A;  1:00pm  . HAND SURGERY    . KNEE SURGERY      Social History   Socioeconomic History  . Marital status: Widowed    Spouse name: Not on file  . Number of children: Not on file  . Years of education: Not on file  . Highest education level: Not on file  Occupational History  . Not on file  Tobacco Use  . Smoking status: Never  . Smokeless tobacco: Never  Vaping Use  . Vaping Use: Never used  Substance and Sexual Activity  . Alcohol use: Never  . Drug use: Never  . Sexual activity: Not Currently  Other Topics Concern  . Not on file  Social History Narrative   Lives at [place] Strain: Not on file  Food Insecurity: Not on file  Transportation Needs: Not on file  Physical Activity: Not on file  Stress: Not on file  Social Connections: Not on file  Intimate Partner Violence: Not on file  Family History  Problem Relation Age of Onset  . Cancer Mother   . Cardiomyopathy Father   . Seizures Sister        childhood  . Seizures Brother        in his 44s  . Colon cancer Neg Hx       VITAL SIGNS BP 125/70   Pulse 77   Temp 98.4 F (36.9 C)   Resp 20   Ht 5\' 2"  (1.575 m)   Wt 179 lb 12.8 oz (81.6 kg)   SpO2 96%   BMI 32.89 kg/m   Outpatient Encounter Medications as of 10/01/2022  Medication Sig  . acetaminophen (TYLENOL) 325 MG tablet Take 650 mg by mouth every 8 (eight) hours.  . AMBULATORY NON FORMULARY MEDICATION Medication Name:  Continue monthly catheter changes with 74fr catheter at skilled nursing facility.  . Artificial Saliva (BIOTENE MOISTURIZING MOUTH MT) Use as directed 1 application. in the mouth or throat at bedtime. 9 pm  .  aspirin EC 81 MG tablet Take 81 mg by mouth daily. Swallow whole.9 am  . augmented betamethasone dipropionate (DIPROLENE-AF) 0.05 % ointment Apply 1 Application topically 2 (two) times daily as needed. For stomach and both breast (do not put on face, groin,underarms)  . calcium carbonate (TUMS EX) 750 MG chewable tablet Chew 1 tablet by mouth 3 (three) times daily.  . camphor-menthol (SARNA) lotion Apply 1 Application topically 2 (two) times daily as needed for itching.  . denosumab (PROLIA) 60 MG/ML SOSY injection Inject 60 mg into the skin every 6 (six) months.  . DULoxetine (CYMBALTA) 20 MG capsule Take 40 mg by mouth daily. 9 am  . fexofenadine (ALLEGRA) 180 MG tablet Take 180 mg by mouth daily.  Marland Kitchen gabapentin (NEURONTIN) 300 MG capsule Take 300 mg by mouth 3 (three) times daily.  . insulin aspart (NOVOLOG FLEXPEN) 100 UNIT/ML FlexPen Inject 5 Units into the skin 3 (three) times daily with meals. 8 am, 12 pm, and 6 pm  . Insulin Pen Needle 30G X 5 MM MISC 1 Device by Does not apply route daily. 3/16"  . ipratropium-albuterol (DUONEB) 0.5-2.5 (3) MG/3ML SOLN Take 3 mLs by nebulization every 6 (six) hours as needed.  Marland Kitchen LANTUS SOLOSTAR 100 UNIT/ML Solostar Pen Inject 20 Units into the skin at bedtime.  . levETIRAcetam (KEPPRA) 1000 MG tablet Take 1,000 mg by mouth daily. At 9 am, see other listings  . levETIRAcetam (KEPPRA) 1000 MG tablet Take 1,000 mg by mouth at bedtime. At 9 pm with 500 mg tablet for a total of 1500 mg at bedtime  . levETIRAcetam (KEPPRA) 500 MG tablet Take 500 mg by mouth at bedtime. Along with 1000 mg for a total on 1500 mg in the pm (see other listing)  . lidocaine (LIDODERM) 5 % Place onto the skin 2 (two) times daily as needed. Apply to Right shoulder as needed once a morning and remove at bedtime  . LORazepam (ATIVAN) 2 MG/ML injection Inject 0.5 mLs (1 mg total) into the vein every 15 (fifteen) minutes as needed.  . magnesium hydroxide (MILK OF MAGNESIA) 400 MG/5ML  suspension Take 30 mLs by mouth daily as needed for mild constipation.  . melatonin 3 MG TABS tablet Take 3 mg by mouth at bedtime.  . Multiple Vitamins-Minerals (THEREMS M PO) (multivitamin with folic acid) tablet; 253 mcg; oral Once A Day;  . NON FORMULARY Diet - NAS, Cons CHO  . omeprazole (PRILOSEC) 20 MG capsule Take 20 mg by mouth  daily.  . rOPINIRole (REQUIP) 1 MG tablet Take 1 mg by mouth at bedtime.  . rosuvastatin (CRESTOR) 20 MG tablet Take 20 mg by mouth daily.  Marland Kitchen tiZANidine (ZANAFLEX) 2 MG tablet Take 2 mg by mouth every 6 (six) hours as needed for muscle spasms (for back and sciatic pain).  . zinc oxide 20 % ointment Apply 1 Application topically 3 (three) times daily as needed for irritation (apply every shift).   No facility-administered encounter medications on file as of 10/01/2022.     SIGNIFICANT DIAGNOSTIC EXAMS  PREVIOUS    09-14-21: ct of head:  No acute intracranial abnormality. 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 New left sphenoid sinus air-fluid level, which may be seen with acute sinusitis.  09-28-21: ct of head No acute intracranial findings are seen in noncontrast CT brain. Chronic sinusitis.  Bilateral mastoid effusions.  04-15-22; dexa: t score -3.391  NO NEW EXAMS     LABS REVIEWED PREVIOUS   10-17-21: wbc 5.9; hgb 8.9; hct 26.6; mcv 95.3 plt 223; glucose 171; bun 44; creat 1.31;k+ 4.8; na++ 134; ca 8.6; gfr 40. D-dimer 1.21; crp 3.2  10-27-21: hgb a1c 6.5 11-04-21: CRp 21.2; d-dimer 1.46 11-24-21: wbc 5.4; hgb 8.7; hct 27.5; mcv 94.2 plt 218; sed rate 80; ana: neg; crp 0.6 01-15-22: hgb a1c 6.7 chol 148; ldl 87; trig 112; hdl 39; tsh 5.663  02-11-22: urine culture: multiple bacteria  04-09-22: liver normal protein 6.0 albumin 3.3; chol 143; ldl 81; trig 71; hdl 48; tsh 3.502  04-27-22; wbc 6.6; hgb 8.7; hct 26.2; mcv 94.6 plt 231; glucose 152; bun 66; creat 1.26; k+ 4.7; na++ 132; ca 8.9; gfr 42; protein 6.7 albumin 3.4 hgb a1c 7.0; vitamin D  37.96 07-09-22: wbc 9.3; hgb 8.5; hct 26.9; mcv 96.8 plt 196; glucose 200; bun 65; creat 2.15; k+ 5.0; na++ 128; ca 7.8; gfr 22 07-10-22: glucose 130; bun 59; creat 1.75; k+ 4.2; na++ 131; ca 7.6; gfr 29 07-30-22: urine micro-albumin 59.7; ACR 127   08-20-22: hgb A1c 8.8  NO NEW LABS.   Review of Systems  Constitutional:  Negative for malaise/fatigue.  Respiratory:  Positive for cough, shortness of breath and wheezing. Negative for sputum production.   Cardiovascular:  Negative for chest pain, palpitations and leg swelling.  Gastrointestinal:  Positive for abdominal pain. Negative for constipation, diarrhea, heartburn, nausea and vomiting.  Musculoskeletal:  Negative for back pain, joint pain and myalgias.  Skin: Negative.   Neurological:  Negative for dizziness.  Psychiatric/Behavioral:  The patient is not nervous/anxious.     Physical Exam Constitutional:      General: She is not in acute distress.    Appearance: She is well-developed. She is obese. She is not diaphoretic.  Neck:     Thyroid: No thyromegaly.  Cardiovascular:     Rate and Rhythm: Normal rate and regular rhythm.     Pulses: Normal pulses.     Heart sounds: Normal heart sounds.  Pulmonary:     Effort: Pulmonary effort is normal. No respiratory distress.     Breath sounds: Wheezing present.  Abdominal:     General: Bowel sounds are normal. There is distension.     Palpations: Abdomen is soft.     Tenderness: There is abdominal tenderness.  Musculoskeletal:     Cervical back: Neck supple.     Right lower leg: No edema.     Left lower leg: No edema.     Comments: Does not move lower extremities  Lymphadenopathy:     Cervical: No cervical adenopathy.  Skin:    General: Skin is warm and dry.  Neurological:     Mental Status: She is alert and oriented to person, place, and time.     Comments: Left hand and arm are shaking   Psychiatric:        Mood and Affect: Mood normal.      ASSESSMENT/  PLAN:  TODAY  Acute bronchitis due to unspecified organism Generalized abdominal pain  Will get chest x-ray and KUB Will give her solumedrol now Will begin 60 mg X2 days; 40 mg X2 days then 20 mg for 2 days.  Will monitor her status.    [name] NP [facility]   call [phone]

## 2022-10-01 NOTE — ED Notes (Signed)
Pt has another soft BM, pt's brief changed, new incontinent pad applied under pt, peri care done. New sacral pad (mepilex) pad placed on pt as well. Pt given additional warm blankets

## 2022-10-01 NOTE — ED Provider Notes (Signed)
[facility] Provider Note   CSN: [id number] Arrival date & time: 10/01/22  0973     History  Chief Complaint  Patient presents with   Abdominal Pain    [name] is a 87 y.o. female.   Abdominal Pain Patient presents abdominal pain.  Has had for the last week.  Lower anterior abdomen but also involves vaginal pain and rectal pain.  Has had some constipation.  Also a cough.  Cough is gotten worse.  Had been seen by provider at nursing home today and had plans for x-ray of chest and abdomen however does not appear to have been done yet.  Also blood work been ordered but does not appear to been done.  Has a chronic Foley catheter per family numbers not been changed out since November.    Past Medical History:  Diagnosis Date   Adult failure to thrive    Anemia    Anxiety    Atherosclerosis of aorta (HCC)    Central cord syndrome at C4 level of cervical spinal cord, subsequent encounter ([place])    CKD (chronic kidney disease)    stage 3   Depression    DM type 2 with diabetic peripheral neuropathy (HCC)    Dysphagia    GERD (gastroesophageal reflux disease)    Gout    High cholesterol    HTN (hypertension)    Hyponatremia    MDD (major depressive disorder)    Neck pain    Neuropathy    Quadriplegia, C1-C4 incomplete (HCC)    Radiculopathy    RLS (restless legs syndrome)    Urinary retention     Home Medications Prior to Admission medications   Medication Sig Start Date End Date Taking? Authorizing Provider  acetaminophen (TYLENOL) 325 MG tablet Take 650 mg by mouth every 8 (eight) hours.   Yes [provider]  Artificial Saliva (BIOTENE MOISTURIZING MOUTH MT) Use as directed 1 application. in the mouth or throat at bedtime. 9 pm   Yes [provider]  aspirin EC 81 MG tablet Take 81 mg by mouth daily. Swallow whole.9 am   Yes [provider]  augmented betamethasone dipropionate (DIPROLENE-AF)  0.05 % ointment Apply 1 Application topically 2 (two) times daily as needed. For stomach and both breast (do not put on face, groin,underarms)   Yes [provider]  calcium carbonate (TUMS EX) 750 MG chewable tablet Chew 1 tablet by mouth 3 (three) times daily.   Yes [provider]  camphor-menthol Timoteo Ace) lotion Apply 1 Application topically 2 (two) times daily as needed for itching.   Yes [provider]  denosumab (PROLIA) 60 MG/ML SOSY injection Inject 60 mg into the skin every 6 (six) months.   Yes [provider]  DULoxetine (CYMBALTA) 20 MG capsule Take 20 mg by mouth daily. 9 am   Yes [provider]  fexofenadine (ALLEGRA) 180 MG tablet Take 180 mg by mouth daily.   Yes [provider]  gabapentin (NEURONTIN) 300 MG capsule Take 300 mg by mouth 3 (three) times daily.   Yes [provider]  insulin aspart (NOVOLOG FLEXPEN) 100 UNIT/ML FlexPen Inject 5 Units into the skin 3 (three) times daily with meals. 8 am, 12 pm, and 6 pm   Yes [provider]  ipratropium-albuterol (DUONEB) 0.5-2.5 (3) MG/3ML SOLN Take 3 mLs by nebulization every 6 (six) hours as needed.   Yes [provider]  LANTUS SOLOSTAR 100  UNIT/ML Solostar Pen Inject 20 Units into the skin at bedtime. 09/04/21  Yes [provider]  levETIRAcetam (KEPPRA) 1000 MG tablet Take 1,000 mg by mouth daily. At 9 am, see other listings   Yes [provider]  levETIRAcetam (KEPPRA) 1000 MG tablet Take 1,000 mg by mouth at bedtime. At 9 pm with 500 mg tablet for a total of 1500 mg at bedtime   Yes [provider]  levETIRAcetam (KEPPRA) 500 MG tablet Take 500 mg by mouth at bedtime. Along with 1000 mg for a total on 1500 mg in the pm (see other listing)   Yes [provider]  lidocaine (LIDODERM) 5 % Place onto the skin 2 (two) times daily as needed. Apply to Right shoulder as needed once a morning and remove at bedtime   Yes  [provider]  LORazepam (ATIVAN) 2 MG/ML injection Inject 0.5 mLs (1 mg total) into the vein every 15 (fifteen) minutes as needed. 10/03/21  Yes Gerlene Fee, NP  melatonin 3 MG TABS tablet Take 3 mg by mouth at bedtime.   Yes [provider]  Multiple Vitamins-Minerals (THEREMS M PO) (multivitamin with folic acid) tablet; 741 mcg; oral Once A Day;   Yes [provider]  omeprazole (PRILOSEC) 20 MG capsule Take 20 mg by mouth 2 (two) times daily before a meal.   Yes [provider]  rOPINIRole (REQUIP) 1 MG tablet Take 1 mg by mouth at bedtime.   Yes [provider]  rosuvastatin (CRESTOR) 20 MG tablet Take 20 mg by mouth daily.   Yes [provider]  tiZANidine (ZANAFLEX) 2 MG tablet Take 2 mg by mouth every 6 (six) hours as needed for muscle spasms (for back and sciatic pain).   Yes [provider]  zinc oxide 20 % ointment Apply 1 Application topically 3 (three) times daily as needed for irritation (apply every shift).   Yes [provider]  AMBULATORY NON FORMULARY MEDICATION Medication Name:  Continue monthly catheter changes with 103fr catheter at skilled nursing facility. 08/12/21   McKenzie, Candee Furbish, MD  Insulin Pen Needle 30G X 5 MM MISC 1 Device by Does not apply route daily. 3/16"    Gerlene Fee, NP  magnesium hydroxide (MILK OF MAGNESIA) 400 MG/5ML suspension Take 30 mLs by mouth daily as needed for mild constipation. Patient not taking: Reported on 10/01/2022    [provider]  NON FORMULARY Diet - NAS, Cons CHO    [provider]      Allergies    Ace inhibitors, Codeine, and Sulfa antibiotics    Review of Systems   Review of Systems  Gastrointestinal:  Positive for abdominal pain.    Physical Exam Updated Vital Signs BP (!) 147/84   Pulse 85   Temp 98.1 F (36.7 C) (Oral)   Resp 17   Ht 5\' 2"  (1.575 m)   Wt 81.6 kg   SpO2 95%   BMI 32.89 kg/m  Physical Exam Vitals  and nursing note reviewed.  Cardiovascular:     Rate and Rhythm: Normal rate.  Abdominal:     Hernia: No hernia is present.     Comments: Moderate lower abdominal tenderness.  May have some fullness somewhat diffusely over the abdomen.  Foley catheter in place.  Patient is paraplegic.  Skin:    General: Skin is warm.  Neurological:     Mental Status: She is alert.     ED Results / Procedures /  Treatments   Labs (all labs ordered are listed, but only abnormal results are displayed) Labs Reviewed  COMPREHENSIVE METABOLIC PANEL - Abnormal; Notable for the following components:      Result Value   Potassium 5.2 (*)    CO2 16 (*)    Glucose, Bld 404 (*)    BUN 43 (*)    Creatinine, Ser 1.69 (*)    Calcium 8.2 (*)    GFR, Estimated 30 (*)    All other components within normal limits  CBC WITH DIFFERENTIAL/PLATELET - Abnormal; Notable for the following components:   RBC 3.15 (*)    Hemoglobin 9.6 (*)    HCT 29.9 (*)    Monocytes Absolute 0.0 (*)    All other components within normal limits  URINALYSIS, ROUTINE W REFLEX MICROSCOPIC - Abnormal; Notable for the following components:   APPearance HAZY (*)    Glucose, UA >=500 (*)    Hgb urine dipstick MODERATE (*)    Protein, ur 100 (*)    Leukocytes,Ua MODERATE (*)    Bacteria, UA RARE (*)    All other components within normal limits  CBG MONITORING, ED - Abnormal; Notable for the following components:   Glucose-Capillary 404 (*)    All other components within normal limits  URINE CULTURE    EKG None  Radiology CT ABDOMEN PELVIS W CONTRAST  Result Date: 10/01/2022 CLINICAL DATA:  Left lower quadrant pain for 1 week EXAM: CT ABDOMEN AND PELVIS WITH CONTRAST TECHNIQUE: Multidetector CT imaging of the abdomen and pelvis was performed using the standard protocol following bolus administration of intravenous contrast. RADIATION DOSE REDUCTION: This exam was performed according to the departmental dose-optimization program which  includes automated exposure control, adjustment of the mA and/or kV according to patient size and/or use of iterative reconstruction technique. CONTRAST:  28mL OMNIPAQUE IOHEXOL 300 MG/ML  SOLN COMPARISON:  07/06/2021 FINDINGS: Lower chest: No acute abnormality. Hepatobiliary: Liver shows a small cyst within the lateral aspect of the right lobe of the liver stable from the prior exam. Gallbladder is well distended and within normal limits. Pancreas: Unremarkable. No pancreatic ductal dilatation or surrounding inflammatory changes. Spleen: Normal in size without focal abnormality. Adrenals/Urinary Tract: Adrenal glands are within normal limits. Left kidney shows a normal enhancement pattern. Left ureter is unremarkable. Right kidney demonstrates delayed enhancement with evidence of hydronephrosis and hydroureter. This extends inferiorly to the mid to distal ureter although no obstructing stone is seen. Bladder is decompressed by Foley catheter. Stomach/Bowel: Changes of rectal impaction are noted. No significant inflammatory changes are noted. Diverticular changes seen without evidence of diverticulitis. The appendix has been surgically removed. Small bowel and stomach are within normal limits. Vascular/Lymphatic: Aortic atherosclerosis. No enlarged abdominal or pelvic lymph nodes. Reproductive: Status post hysterectomy. No adnexal masses. Other: No abdominal wall hernia or abnormality. No abdominopelvic ascites. 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 No other bony abnormality is noted. IMPRESSION: Mild fullness of the right renal collecting system is noted although no obstructive stone is seen. These changes may be related to the crossing of the iliac vessel as the distal ureter appears within normal limits. Changes consistent with rectal impaction. No inflammatory changes are seen. No other focal abnormality is noted. Electronically Signed   By: Inez Catalina M.D.   On: 10/01/2022 22:05   DG Abdomen Acute W/Chest  Result Date: 10/01/2022 CLINICAL DATA:  Cough, abdominal pain. EXAM: DG ABDOMEN ACUTE WITH 1 VIEW CHEST COMPARISON:  02/05/2021. FINDINGS: There is no evidence of dilated bowel loops or free intraperitoneal air. A moderate to large amount of stool is noted in the colon and rectum. No radiopaque calculi or other significant radiographic abnormality is seen. Heart size and mediastinal contours are within normal limits. Minimal subsegmental atelectasis is present at the left lung base. No effusion or pneumothorax. Cervical spinal fusion hardware is noted. IMPRESSION: 1. No evidence of bowel obstruction. 2. Moderate-to-large amount of stool in the colon and rectum suggesting constipation. 3. No acute cardiopulmonary disease. Electronically Signed   By: Brett Fairy M.D.   On: 10/01/2022 20:53    Procedures Procedures    Medications Ordered in ED Medications  fentaNYL (SUBLIMAZE) injection 50 mcg (50 mcg Intravenous Given 10/01/22 2127)  iohexol (OMNIPAQUE) 300 MG/ML solution 75 mL (75 mLs Intravenous Contrast Given 10/01/22 2143)  insulin aspart (novoLOG) injection 10 Units (10 Units Intravenous Given 10/01/22 2256)    ED Course/ Medical Decision Making/ A&P                             Medical Decision Making Amount and/or Complexity of Data Reviewed Labs: ordered. Radiology: ordered.  Risk Prescription drug management.   Patient with abdominal pain.  Lower abdomen but also somewhat diffuse.  Also feels some vaginal and rectal pain.  Will get close reevaluation when chaperone is available.  Bedside ultrasound did not show urinary retention and does have Foley catheter in place that appears to be draining.  UTI considered.  Also bowel obstruction and constipation.  Will start with x-rays and blood work but may end up needing CT scan.   CT showed constipation without other clear cause.  Will treat.  Also hyperglycemia doubt DKA.   Should be able to discharge home.  Care turned over Dr. Betsey Holiday         Final Clinical Impression(s) / ED Diagnoses Final diagnoses:  Constipation, unspecified constipation type  Abdominal pain, unspecified abdominal location  Hyperglycemia    Rx / DC Orders ED Discharge Orders     None         Davonna Belling, MD 10/01/22 2313

## 2022-10-01 NOTE — ED Notes (Signed)
Pt had a moderate size BM, multiple golf ball size stool disimpacted from pt's rectum saponaceously. Peri care done, new brief placed on pt and clean incontinent pads applied under pt. Sacral pad removed as it was soiled and new sacral (mepilex) pad placed on pt as well. Pt repositioned for comfort as well. Pt given water to drink as requested. Dr. Betsey Holiday updated

## 2022-10-01 NOTE — Progress Notes (Signed)
Location:  [facility] Room Number: 154-W Place of Service:  SNF (31)   CODE STATUS: DNR  Allergies  Allergen Reactions   Ace Inhibitors Other (See Comments)    Hyperkalemia    Codeine    Sulfa Antibiotics     Chief Complaint  Patient presents with   Acute Visit    Uncontrolled cough    HPI:    Past Medical History:  Diagnosis Date   Adult failure to thrive    Anemia    Anxiety    Atherosclerosis of aorta (HCC)    Central cord syndrome at C4 level of cervical spinal cord, subsequent encounter ([place])    CKD (chronic kidney disease)    stage 3   Depression    DM type 2 with diabetic peripheral neuropathy (HCC)    Dysphagia    GERD (gastroesophageal reflux disease)    Gout    High cholesterol    HTN (hypertension)    Hyponatremia    MDD (major depressive disorder)    Neck pain    Neuropathy    Quadriplegia, C1-C4 incomplete (HCC)    Radiculopathy    RLS (restless legs syndrome)    Urinary retention     Past Surgical History:  Procedure Laterality Date   ABDOMINAL HYSTERECTOMY     ANTERIOR CERVICAL DECOMP/DISCECTOMY FUSION N/A 02/05/2021   Procedure: Cervical Three-Four  Anterior cervical decompression/discectomy/fusion;  Surgeon: Ashok Pall, MD;  Location: [facility];  Service: Neurosurgery;  Laterality: N/A;  RM 20   APPENDECTOMY     BACK SURGERY     BIOPSY  09/22/2021   Procedure: BIOPSY;  Surgeon: [name], DO;  Location: [facility];  Service: Endoscopy;;   CERVICAL DISC SURGERY     ESOPHAGOGASTRODUODENOSCOPY (EGD) WITH PROPOFOL N/A 09/22/2021   Procedure: ESOPHAGOGASTRODUODENOSCOPY (EGD) WITH PROPOFOL;  Surgeon: [name], DO;  Location: [facility];  Service: Endoscopy;  Laterality: N/A;  1:00pm   HAND SURGERY     KNEE SURGERY      Social History   Socioeconomic History   Marital status: Widowed    Spouse name: Not on file   Number of children: Not on file   Years of education: Not on file   Highest  education level: Not on file  Occupational History   Not on file  Tobacco Use   Smoking status: Never   Smokeless tobacco: Never  Vaping Use   Vaping Use: Never used  Substance and Sexual Activity   Alcohol use: Never   Drug use: Never   Sexual activity: Not Currently  Other Topics Concern   Not on file  Social History Narrative   Lives at [facility]   Social Determinants of Health   Financial Resource Strain: Not on file  Food Insecurity: Not on file  Transportation Needs: Not on file  Physical Activity: Not on file  Stress: Not on file  Social Connections: Not on file  Intimate Partner Violence: Not on file   Family History  Problem Relation Age of Onset   Cancer Mother    Cardiomyopathy Father    Seizures Sister        childhood   Seizures Brother        in his 49s   Colon cancer Neg Hx       VITAL SIGNS BP 125/70   Pulse 77   Temp 98.4 F (36.9 C)   Resp 20   Ht 5\' 2"  (1.575 m)  Wt 179 lb 12.8 oz (81.6 kg)   SpO2 96%   BMI 32.89 kg/m   Outpatient Encounter Medications as of 10/01/2022  Medication Sig   acetaminophen (TYLENOL) 325 MG tablet Take 650 mg by mouth every 8 (eight) hours.   AMBULATORY NON FORMULARY MEDICATION Medication Name:  Continue monthly catheter changes with 56fr catheter at skilled nursing facility.   Artificial Saliva (BIOTENE MOISTURIZING MOUTH MT) Use as directed 1 application. in the mouth or throat at bedtime. 9 pm   aspirin EC 81 MG tablet Take 81 mg by mouth daily. Swallow whole.9 am   augmented betamethasone dipropionate (DIPROLENE-AF) 0.05 % ointment Apply 1 Application topically 2 (two) times daily as needed. For stomach and both breast (do not put on face, groin,underarms)   calcium carbonate (TUMS EX) 750 MG chewable tablet Chew 1 tablet by mouth 3 (three) times daily.   camphor-menthol (SARNA) lotion Apply 1 Application topically 2 (two) times daily as needed for itching.   denosumab (PROLIA) 60 MG/ML SOSY  injection Inject 60 mg into the skin every 6 (six) months.   DULoxetine (CYMBALTA) 20 MG capsule Take 20 mg by mouth daily. 9 am   fexofenadine (ALLEGRA) 180 MG tablet Take 180 mg by mouth daily.   gabapentin (NEURONTIN) 300 MG capsule Take 300 mg by mouth 3 (three) times daily.   insulin aspart (NOVOLOG FLEXPEN) 100 UNIT/ML FlexPen Inject 5 Units into the skin 3 (three) times daily with meals. 8 am, 12 pm, and 6 pm   Insulin Pen Needle 30G X 5 MM MISC 1 Device by Does not apply route daily. 3/16"   ipratropium-albuterol (DUONEB) 0.5-2.5 (3) MG/3ML SOLN Take 3 mLs by nebulization every 6 (six) hours as needed.   LANTUS SOLOSTAR 100 UNIT/ML Solostar Pen Inject 20 Units into the skin at bedtime.   levETIRAcetam (KEPPRA) 1000 MG tablet Take 1,000 mg by mouth daily. At 9 am, see other listings   levETIRAcetam (KEPPRA) 1000 MG tablet Take 1,000 mg by mouth at bedtime. At 9 pm with 500 mg tablet for a total of 1500 mg at bedtime   levETIRAcetam (KEPPRA) 500 MG tablet Take 500 mg by mouth at bedtime. Along with 1000 mg for a total on 1500 mg in the pm (see other listing)   lidocaine (LIDODERM) 5 % Place onto the skin 2 (two) times daily as needed. Apply to Right shoulder as needed once a morning and remove at bedtime   LORazepam (ATIVAN) 2 MG/ML injection Inject 0.5 mLs (1 mg total) into the vein every 15 (fifteen) minutes as needed.   magnesium hydroxide (MILK OF MAGNESIA) 400 MG/5ML suspension Take 30 mLs by mouth daily as needed for mild constipation.   melatonin 3 MG TABS tablet Take 3 mg by mouth at bedtime.   Multiple Vitamins-Minerals (THEREMS M PO) (multivitamin with folic acid) tablet; 637 mcg; oral Once A Day;   NON FORMULARY Diet - NAS, Cons CHO   omeprazole (PRILOSEC) 20 MG capsule Take 20 mg by mouth daily.   rOPINIRole (REQUIP) 1 MG tablet Take 1 mg by mouth at bedtime.   rosuvastatin (CRESTOR) 20 MG tablet Take 20 mg by mouth daily.   tiZANidine (ZANAFLEX) 2 MG tablet Take 2 mg by mouth  every 6 (six) hours as needed for muscle spasms (for back and sciatic pain).   zinc oxide 20 % ointment Apply 1 Application topically 3 (three) times daily as needed for irritation (apply every shift).   No facility-administered encounter medications on file  as of 10/01/2022.     SIGNIFICANT DIAGNOSTIC EXAMS       ASSESSMENT/ PLAN:     [name] NP [facility]  Contact [phone] Monday through Friday 8am- 5pm  After hours call [phone]

## 2022-10-02 NOTE — ED Notes (Signed)
Receiving RN MO from [facility] has agreed to accept [facility] once pt has arrived back to facility, all questions and concerns address. SNF will send a tech and stretcher to transport pt back to facility through underground tunnel.   RN MO did advised that pt gets her foley changed out at least once a month as schedule and her last foley changed was a week ago.   Pt's paperwork and DNR formed in folder that pt arrived with and will follow her back to facility

## 2022-10-02 NOTE — ED Notes (Signed)
Called back over to [facility], RN MO advised he is getting a stretcher at this time to come get pt to transfer her back to the facility.

## 2022-10-02 NOTE — ED Notes (Signed)
Staff from [facility] has arrived with stretcher to transport pt back to facility. Daughter at bedside and thankful

## 2022-10-05 ENCOUNTER — Emergency Department (HOSPITAL_COMMUNITY)
Admission: EM | Admit: 2022-10-05 | Discharge: 2022-10-05 | Disposition: A | Discharge status: Skilled Nursing Facility | Payer: Medicare HMO | Attending: Emergency Medicine | Admitting: Emergency Medicine

## 2022-10-05 ENCOUNTER — Other Ambulatory Visit: Payer: Self-pay

## 2022-10-05 ENCOUNTER — Emergency Department (HOSPITAL_COMMUNITY): Payer: Medicare HMO

## 2022-10-05 ENCOUNTER — Encounter (HOSPITAL_COMMUNITY): Payer: Self-pay | Admitting: *Deleted

## 2022-10-05 DIAGNOSIS — K5900 Constipation, unspecified: Secondary | ICD-10-CM | POA: Diagnosis not present

## 2022-10-05 DIAGNOSIS — R109 Unspecified abdominal pain: Secondary | ICD-10-CM | POA: Diagnosis not present

## 2022-10-05 DIAGNOSIS — I10 Essential (primary) hypertension: Secondary | ICD-10-CM | POA: Diagnosis not present

## 2022-10-05 DIAGNOSIS — R1084 Generalized abdominal pain: Secondary | ICD-10-CM | POA: Diagnosis not present

## 2022-10-05 DIAGNOSIS — E119 Type 2 diabetes mellitus without complications: Secondary | ICD-10-CM | POA: Insufficient documentation

## 2022-10-05 DIAGNOSIS — Z7982 Long term (current) use of aspirin: Secondary | ICD-10-CM | POA: Diagnosis not present

## 2022-10-05 DIAGNOSIS — Z794 Long term (current) use of insulin: Secondary | ICD-10-CM | POA: Diagnosis not present

## 2022-10-05 DIAGNOSIS — Z8719 Personal history of other diseases of the digestive system: Secondary | ICD-10-CM | POA: Diagnosis not present

## 2022-10-05 LAB — URINE CULTURE: Culture: 50000 — AB

## 2022-10-05 NOTE — ED Triage Notes (Signed)
Pt brought in by [facility] for c/o abdominal pain and rectal pain;  pt was seen here Friday for same complaint, no acute findings found and pt sent back to [facility]

## 2022-10-05 NOTE — ED Notes (Signed)
Report given to facility. They are to come and pick up pt

## 2022-10-05 NOTE — Discharge Instructions (Signed)
Continue with Metamucil and drink plenty of fluids.

## 2022-10-05 NOTE — ED Provider Notes (Signed)
[facility] Provider Note   CSN: [id number] Arrival date & time: 10/05/22  1244     History {Add pertinent medical, surgical, social history, OB history to HPI:1} Chief Complaint  Patient presents with   Abdominal Pain    [name] is a 87 y.o. female.  Patient has a history of constipation.  She was seen here couple days ago and had a CT scan of her abdomen that was unremarkable and she had an antibiotic   Abdominal Pain      Home Medications Prior to Admission medications   Medication Sig Start Date End Date Taking? Authorizing Provider  acetaminophen (TYLENOL) 325 MG tablet Take 650 mg by mouth every 8 (eight) hours.    [provider]  AMBULATORY NON FORMULARY MEDICATION Medication Name:  Continue monthly catheter changes with 67fr catheter at skilled nursing facility. 08/12/21   McKenzie, Candee Furbish, MD  Artificial Saliva (BIOTENE MOISTURIZING MOUTH MT) Use as directed 1 application. in the mouth or throat at bedtime. 9 pm    [provider]  aspirin EC 81 MG tablet Take 81 mg by mouth daily. Swallow whole.9 am    [provider]  augmented betamethasone dipropionate (DIPROLENE-AF) 0.05 % ointment Apply 1 Application topically 2 (two) times daily as needed. For stomach and both breast (do not put on face, groin,underarms)    [provider]  calcium carbonate (TUMS EX) 750 MG chewable tablet Chew 1 tablet by mouth 3 (three) times daily.    [provider]  camphor-menthol Timoteo Ace) lotion Apply 1 Application topically 2 (two) times daily as needed for itching.    [provider]  denosumab (PROLIA) 60 MG/ML SOSY injection Inject 60 mg into the skin every 6 (six) months.    [provider]  DULoxetine (CYMBALTA) 20 MG capsule Take 20 mg by mouth daily. 9 am    [provider]  fexofenadine (ALLEGRA) 180 MG tablet Take 180 mg by mouth daily.    [provider]  gabapentin (NEURONTIN) 300 MG capsule Take 300 mg by mouth 3 (three) times daily.    [provider]  insulin aspart (NOVOLOG FLEXPEN) 100 UNIT/ML FlexPen Inject 5 Units into the skin 3 (three) times daily with meals. 8 am, 12 pm, and 6 pm    [provider]  Insulin Pen Needle 30G X 5 MM MISC 1 Device by Does not apply route daily. 3/16"    Gerlene Fee, NP  ipratropium-albuterol (DUONEB) 0.5-2.5 (3) MG/3ML SOLN Take 3 mLs by nebulization every 6 (six) hours as needed.    [provider]  LANTUS SOLOSTAR 100 UNIT/ML Solostar Pen Inject 20 Units into the skin at bedtime. 09/04/21   [provider]  levETIRAcetam (KEPPRA) 1000 MG tablet Take 1,000 mg by mouth daily. At 9 am, see other listings    [provider]  levETIRAcetam (KEPPRA) 1000 MG tablet Take 1,000 mg by mouth at bedtime. At 9 pm with 500 mg tablet for a total of 1500 mg at bedtime    [provider]  levETIRAcetam (KEPPRA) 500 MG tablet Take 500 mg by mouth at bedtime. Along with 1000 mg for a total on 1500 mg in the pm (see other listing)    [provider]  lidocaine (LIDODERM) 5 % Place onto the skin 2 (two) times daily as needed. Apply to Right shoulder as needed once a morning and remove at bedtime  [provider]  LORazepam (ATIVAN) 2 MG/ML injection Inject 0.5 mLs (1 mg total) into the vein every 15 (fifteen) minutes as needed. 10/03/21   Gerlene Fee, NP  magnesium hydroxide (MILK OF MAGNESIA) 400 MG/5ML suspension Take 30 mLs by mouth daily as needed for mild constipation. Patient not taking: Reported on 10/01/2022    [provider]  melatonin 3 MG TABS tablet Take 3 mg by mouth at bedtime.    [provider]  Multiple Vitamins-Minerals (THEREMS M PO) (multivitamin with folic acid) tablet; 737 mcg; oral Once A Day;    [provider]  NON FORMULARY Diet - NAS, Cons CHO    [provider]  omeprazole  (PRILOSEC) 20 MG capsule Take 20 mg by mouth 2 (two) times daily before a meal.    [provider]  rOPINIRole (REQUIP) 1 MG tablet Take 1 mg by mouth at bedtime.    [provider]  rosuvastatin (CRESTOR) 20 MG tablet Take 20 mg by mouth daily.    [provider]  tiZANidine (ZANAFLEX) 2 MG tablet Take 2 mg by mouth every 6 (six) hours as needed for muscle spasms (for back and sciatic pain).    [provider]  zinc oxide 20 % ointment Apply 1 Application topically 3 (three) times daily as needed for irritation (apply every shift).    [provider]      Allergies    Ace inhibitors, Codeine, and Sulfa antibiotics    Review of Systems   Review of Systems  Gastrointestinal:  Positive for abdominal pain.    Physical Exam Updated Vital Signs BP 119/79   Pulse 66   Temp 98.2 F (36.8 C) (Oral)   Resp 20   Ht 5\' 2"  (1.575 m)   Wt 81.5 kg   SpO2 95%   BMI 32.86 kg/m  Physical Exam  ED Results / Procedures / Treatments   Labs (all labs ordered are listed, but only abnormal results are displayed) Labs Reviewed - No data to display  EKG None  Radiology DG ABD ACUTE 2+V W 1V CHEST  Result Date: 10/05/2022 CLINICAL DATA:  Abdominal pain, rectal pain. EXAM: DG ABDOMEN ACUTE WITH 1 VIEW CHEST COMPARISON:  October 01, 2022. FINDINGS: There is no evidence of dilated bowel loops or free intraperitoneal air. Mild amount of stool seen throughout the colon. No radiopaque calculi or other significant radiographic abnormality is seen. Heart size and mediastinal contours are within normal limits. Both lungs are clear. IMPRESSION: Mild stool burden. No abnormal bowel dilatation. No acute cardiopulmonary disease. Electronically Signed   By: Marijo Conception M.D.   On: 10/05/2022 14:10    Procedures Procedures  {Document cardiac monitor, telemetry assessment procedure when appropriate:1}  Medications Ordered in ED Medications - No data to  display  ED Course/ Medical Decision Making/ A&P   {   Click here for ABCD2, HEART and other calculatorsREFRESH Note before signing :1}                          Medical Decision Making Amount and/or Complexity of Data Reviewed Radiology: ordered.  Patient with mild constipation.  She she will increase her fluids and follow-up as needed  {Document critical care time when appropriate:1} {Document review of labs and clinical decision tools ie heart score, Chads2Vasc2 etc:1}  {Document your independent review of radiology images, and any outside records:1} {Document your discussion with family members, caretakers, and with  consultants:1} {Document social determinants of health affecting pt's care:1} {Document your decision making why or why not admission, treatments were needed:1} Final Clinical Impression(s) / ED Diagnoses Final diagnoses:  Constipation, unspecified constipation type    Rx / DC Orders ED Discharge Orders     None

## 2022-10-06 ENCOUNTER — Encounter: Payer: Self-pay | Admitting: Adult Health

## 2022-10-06 ENCOUNTER — Non-Acute Institutional Stay (SKILLED_NURSING_FACILITY): Payer: Medicare HMO | Admitting: Adult Health

## 2022-10-06 ENCOUNTER — Telehealth (HOSPITAL_BASED_OUTPATIENT_CLINIC_OR_DEPARTMENT_OTHER): Payer: Self-pay

## 2022-10-06 DIAGNOSIS — K6289 Other specified diseases of anus and rectum: Secondary | ICD-10-CM | POA: Diagnosis not present

## 2022-10-06 DIAGNOSIS — E1122 Type 2 diabetes mellitus with diabetic chronic kidney disease: Secondary | ICD-10-CM | POA: Diagnosis not present

## 2022-10-06 DIAGNOSIS — B3731 Acute candidiasis of vulva and vagina: Secondary | ICD-10-CM | POA: Diagnosis not present

## 2022-10-06 DIAGNOSIS — N184 Chronic kidney disease, stage 4 (severe): Secondary | ICD-10-CM

## 2022-10-06 NOTE — Progress Notes (Signed)
ED Antimicrobial Stewardship Positive Culture Follow Up   [name] is an 87 y.o. female who presented to [facility] on 10/01/2022 with a chief complaint of  Chief Complaint  Patient presents with   Abdominal Pain    Recent Results (from the past 720 hour(s))  Remove and replace urinary cath (placed > 5 days) then obtain urine culture from new indwelling urinary catheter.     Status: Abnormal   Collection Time: 10/01/22  8:05 PM   Specimen: Urine, Catheterized  Result Value Ref Range Status   Specimen Description   Final    URINE, CATHETERIZED Performed at [facility], [address] [place], [place] [address]    Special Requests   Final    NONE Performed at [facility], [address] [place], [place] [address]    Culture (A)  Final    50,000 COLONIES/mL KLEBSIELLA PNEUMONIAE 20,000 COLONIES/mL ESCHERICHIA COLI Confirmed Extended Spectrum Beta-Lactamase Producer (ESBL).  In bloodstream infections from ESBL organisms, carbapenems are preferred over piperacillin/tazobactam. They are shown to have a lower risk of mortality.    Report Status 10/05/2022 FINAL  Final   Organism ID, Bacteria KLEBSIELLA PNEUMONIAE (A)  Final   Organism ID, Bacteria ESCHERICHIA COLI (A)  Final      Susceptibility   Escherichia coli - MIC*    AMPICILLIN >=32 RESISTANT Resistant     CEFAZOLIN >=64 RESISTANT Resistant     CEFEPIME <=0.12 SENSITIVE Sensitive     CEFTRIAXONE 32 RESISTANT Resistant     CIPROFLOXACIN <=0.25 SENSITIVE Sensitive     GENTAMICIN <=1 SENSITIVE Sensitive     IMIPENEM <=0.25 SENSITIVE Sensitive     NITROFURANTOIN 64 INTERMEDIATE Intermediate     TRIMETH/SULFA <=20 SENSITIVE Sensitive     AMPICILLIN/SULBACTAM 8 SENSITIVE Sensitive     PIP/TAZO <=4 SENSITIVE Sensitive     * 20,000 COLONIES/mL ESCHERICHIA COLI   Klebsiella pneumoniae - MIC*    AMPICILLIN RESISTANT Resistant     CEFAZOLIN <=4 SENSITIVE Sensitive     CEFEPIME <=0.12 SENSITIVE Sensitive     CEFTRIAXONE  <=0.25 SENSITIVE Sensitive     CIPROFLOXACIN <=0.25 SENSITIVE Sensitive     GENTAMICIN <=1 SENSITIVE Sensitive     IMIPENEM <=0.25 SENSITIVE Sensitive     NITROFURANTOIN 64 INTERMEDIATE Intermediate     TRIMETH/SULFA <=20 SENSITIVE Sensitive     AMPICILLIN/SULBACTAM <=2 SENSITIVE Sensitive     PIP/TAZO <=4 SENSITIVE Sensitive     * 50,000 COLONIES/mL KLEBSIELLA PNEUMONIAE    [x]  Patient discharged originally without antimicrobial agent and treatment is now indicated  Discussion with MD regarding colonization vs active infection. Hx of growing out Kleb pneumo in urine, due to patient being unable to report symptoms elected to empirically treat.   New antibiotic prescription: Fosfomycin 3g every 3 days x 2 doses.   ED Provider: Godfrey Pick, MD   Esmeralda Arthur, PharmD, BCCCP  10/06/2022, 9:41 AM Clinical Pharmacist Monday - Friday phone -  [phone] Saturday - Sunday phone - [phone]

## 2022-10-06 NOTE — Progress Notes (Signed)
Location:  [facility] Room Number: NO/154/W Place of Service:  SNF (31) [name] S.,NP  CODE STATUS: DNR  Allergies  Allergen Reactions   Ace Inhibitors Other (See Comments)    Hyperkalemia    Codeine    Sulfa Antibiotics     Chief Complaint  Patient presents with   Acute Visit    Patient is here for follow up after an emergency department visit     HPI:  She has been taken to the ED twice over the past several days. She has been having rectal; vaginal; abdominal pain. She was treated for constipation. She did have a urine culture done as well. Today she is complaining of vaginal pain. She does have yeast drainage present.   Past Medical History:  Diagnosis Date   Adult failure to thrive    Anemia    Anxiety    Atherosclerosis of aorta (HCC)    Central cord syndrome at C4 level of cervical spinal cord, subsequent encounter ([place])    CKD (chronic kidney disease)    stage 3   Depression    DM type 2 with diabetic peripheral neuropathy (HCC)    Dysphagia    GERD (gastroesophageal reflux disease)    Gout    High cholesterol    HTN (hypertension)    Hyponatremia    MDD (major depressive disorder)    Neck pain    Neuropathy    Quadriplegia, C1-C4 incomplete (HCC)    Radiculopathy    RLS (restless legs syndrome)    Urinary retention     Past Surgical History:  Procedure Laterality Date   ABDOMINAL HYSTERECTOMY     ANTERIOR CERVICAL DECOMP/DISCECTOMY FUSION N/A 02/05/2021   Procedure: Cervical Three-Four  Anterior cervical decompression/discectomy/fusion;  Surgeon: Ashok Pall, MD;  Location: [facility];  Service: Neurosurgery;  Laterality: N/A;  RM 20   APPENDECTOMY     BACK SURGERY     BIOPSY  09/22/2021   Procedure: BIOPSY;  Surgeon: [name], DO;  [facility];  Service: Endoscopy;;   CERVICAL DISC SURGERY     ESOPHAGOGASTRODUODENOSCOPY (EGD) WITH PROPOFOL N/A 09/22/2021   Procedure: ESOPHAGOGASTRODUODENOSCOPY (EGD)  WITH PROPOFOL;  Surgeon: [name], DO;  Location[facility];  Service: Endoscopy;  Laterality: N/A;  1:00pm   HAND SURGERY     KNEE SURGERY      Social History   Socioeconomic History   Marital status: Widowed    Spouse name: Not on file   Number of children: Not on file   Years of education: Not on file   Highest education level: Not on file  Occupational History   Not on file  Tobacco Use   Smoking status: Never   Smokeless tobacco: Never  Vaping Use   Vaping Use: Never used  Substance and Sexual Activity   Alcohol use: Never   Drug use: Never   Sexual activity: Not Currently  Other Topics Concern   Not on file  Social History Narrative   Lives at [facility]   Social Determinants of Health   Financial Resource Strain: Not on file  Food Insecurity: Not on file  Transportation Needs: Not on file  Physical Activity: Not on file  Stress: Not on file  Social Connections: Not on file  Intimate Partner Violence: Not on file   Family History  Problem Relation Age of Onset   Cancer Mother    Cardiomyopathy Father    Seizures Sister  childhood   Seizures Brother        in his 84s   Colon cancer Neg Hx       VITAL SIGNS BP 131/67   Pulse 78   Temp 97.8 F (36.6 C)   Resp (!) 22   Ht 5' 2"$  (1.575 m)   Wt 179 lb 12.8 oz (81.6 kg)   SpO2 98%   BMI 32.89 kg/m   Outpatient Encounter Medications as of 10/06/2022  Medication Sig   acetaminophen (TYLENOL) 325 MG tablet Take 650 mg by mouth every 8 (eight) hours.   AMBULATORY NON FORMULARY MEDICATION Medication Name:  Continue monthly catheter changes with 72f catheter at skilled nursing facility.   Artificial Saliva (BIOTENE MOISTURIZING MOUTH MT) Use as directed 1 application. in the mouth or throat at bedtime. 9 pm   aspirin EC 81 MG tablet Take 81 mg by mouth daily. Swallow whole.9 am   augmented betamethasone dipropionate (DIPROLENE-AF) 0.05 % ointment Apply 1 Application topically  2 (two) times daily as needed. For stomach and both breast (do not put on face, groin,underarms)   calcium carbonate (TUMS EX) 750 MG chewable tablet Chew 1 tablet by mouth 3 (three) times daily.   camphor-menthol (SARNA) lotion Apply 1 Application topically 2 (two) times daily as needed for itching.   denosumab (PROLIA) 60 MG/ML SOSY injection Inject 60 mg into the skin every 6 (six) months.   DULoxetine (CYMBALTA) 20 MG capsule Take 20 mg by mouth daily. 9 am   fexofenadine (ALLEGRA) 180 MG tablet Take 180 mg by mouth daily.   gabapentin (NEURONTIN) 300 MG capsule Take 300 mg by mouth 3 (three) times daily.   insulin aspart (NOVOLOG FLEXPEN) 100 UNIT/ML FlexPen Inject 5 Units into the skin 3 (three) times daily with meals. 8 am, 12 pm, and 6 pm   Insulin Pen Needle 30G X 5 MM MISC 1 Device by Does not apply route daily. 3/16"   ipratropium-albuterol (DUONEB) 0.5-2.5 (3) MG/3ML SOLN Take 3 mLs by nebulization every 6 (six) hours as needed.   LANTUS SOLOSTAR 100 UNIT/ML Solostar Pen Inject 20 Units into the skin at bedtime.   levETIRAcetam (KEPPRA) 1000 MG tablet Take 1,000 mg by mouth daily. At 9 am, see other listings   levETIRAcetam (KEPPRA) 1000 MG tablet Take 1,000 mg by mouth at bedtime. At 9 pm with 500 mg tablet for a total of 1500 mg at bedtime   levETIRAcetam (KEPPRA) 500 MG tablet Take 500 mg by mouth at bedtime. Along with 1000 mg for a total on 1500 mg in the pm (see other listing)   lidocaine (LIDODERM) 5 % Place onto the skin 2 (two) times daily as needed. Apply to Right shoulder as needed once a morning and remove at bedtime   LORazepam (ATIVAN) 2 MG/ML injection Inject 0.5 mLs (1 mg total) into the vein every 15 (fifteen) minutes as needed.   magnesium hydroxide (MILK OF MAGNESIA) 400 MG/5ML suspension Take 30 mLs by mouth daily as needed for mild constipation.   melatonin 3 MG TABS tablet Take 3 mg by mouth at bedtime.   Multiple Vitamins-Minerals (THEREMS M PO) (multivitamin with  folic acid) tablet; 4A999333mcg; oral Once A Day;   NON FORMULARY Diet - NAS, Cons CHO   omeprazole (PRILOSEC) 20 MG capsule Take 20 mg by mouth 2 (two) times daily before a meal.   rOPINIRole (REQUIP) 1 MG tablet Take 1 mg by mouth at bedtime.   rosuvastatin (CRESTOR) 20 MG tablet  Take 20 mg by mouth daily.   tiZANidine (ZANAFLEX) 2 MG tablet Take 2 mg by mouth every 6 (six) hours as needed for muscle spasms (for back and sciatic pain).   zinc oxide 20 % ointment Apply 1 Application topically 3 (three) times daily as needed for irritation (apply every shift).   No facility-administered encounter medications on file as of 10/06/2022.     SIGNIFICANT DIAGNOSTIC EXAMS  PREVIOUS    09-14-21: ct of head:  No acute intracranial abnormality. 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 New left sphenoid sinus air-fluid level, which may be seen with acute sinusitis.  09-28-21: ct of head No acute intracranial findings are seen in noncontrast CT brain. Chronic sinusitis.  Bilateral mastoid effusions.  04-15-22; dexa: t score -3.391  NO NEW EXAMS     LABS REVIEWED PREVIOUS   10-17-21: wbc 5.9; hgb 8.9; hct 26.6; mcv 95.3 plt 223; glucose 171; bun 44; creat 1.31;k+ 4.8; na++ 134; ca 8.6; gfr 40. D-dimer 1.21; crp 3.2  10-27-21: hgb a1c 6.5 11-04-21: CRp 21.2; d-dimer 1.46 11-24-21: wbc 5.4; hgb 8.7; hct 27.5; mcv 94.2 plt 218; sed rate 80; ana: neg; crp 0.6 01-15-22: hgb a1c 6.7 chol 148; ldl 87; trig 112; hdl 39; tsh 5.663  02-11-22: urine culture: multiple bacteria  04-09-22: liver normal protein 6.0 albumin 3.3; chol 143; ldl 81; trig 71; hdl 48; tsh 3.502  04-27-22; wbc 6.6; hgb 8.7; hct 26.2; mcv 94.6 plt 231; glucose 152; bun 66; creat 1.26; k+ 4.7; na++ 132; ca 8.9; gfr 42; protein 6.7 albumin 3.4 hgb a1c 7.0; vitamin D 37.96 07-09-22: wbc 9.3; hgb 8.5; hct 26.9; mcv 96.8 plt 196; glucose 200; bun 65; creat 2.15; k+ 5.0; na++ 128; ca 7.8; gfr 22 07-10-22: glucose 130; bun 59; creat 1.75; k+ 4.2; na++ 131;  ca 7.6; gfr 29 07-30-22: urine micro-albumin 59.7; ACR 127   08-20-22: hgb A1c 8.8  NO NEW LABS.   Review of Systems  Constitutional:  Negative for malaise/fatigue.  Respiratory:  Negative for cough and shortness of breath.   Cardiovascular:  Negative for chest pain, palpitations and leg swelling.  Gastrointestinal:  Negative for abdominal pain, constipation and heartburn.       Rectal pain   Genitourinary:        Vaginal pain   Musculoskeletal:  Negative for back pain, joint pain and myalgias.  Skin: Negative.   Neurological:  Negative for dizziness.  Psychiatric/Behavioral:  The patient is not nervous/anxious.    Physical Exam Constitutional:      General: She is not in acute distress.    Appearance: She is well-developed. She is not diaphoretic.  Neck:     Thyroid: No thyromegaly.  Cardiovascular:     Rate and Rhythm: Normal rate and regular rhythm.     Heart sounds: Normal heart sounds.  Pulmonary:     Effort: Pulmonary effort is normal. No respiratory distress.     Breath sounds: Normal breath sounds.  Abdominal:     General: Bowel sounds are normal. There is no distension.     Palpations: Abdomen is soft.     Tenderness: There is no abdominal tenderness.     Comments: Has external hemorrhoids   Genitourinary:    Comments: Yeast drainage is inflamed  Musculoskeletal:     Cervical back: Neck supple.     Right lower leg: No edema.     Left lower leg: No edema.     Comments: Does not move lower extremities  Lymphadenopathy:     Cervical: No cervical adenopathy.  Skin:    General: Skin is warm and dry.  Neurological:     Mental Status: She is alert and oriented to person, place, and time.  Psychiatric:        Mood and Affect: Mood normal.        ASSESSMENT/ PLAN:  TODAY  CKD stage 4 due to type 2 diabetes mellitus Rectal pain Vaginal yeast infection  Will begin diflucan 150 mg today and to repeat in 3 days Will begin farxiga 5 mg daily for the  kidney protection    [name] NP [facility]   call [phone]

## 2022-10-06 NOTE — Telephone Encounter (Signed)
Post ED Visit - Positive Culture Follow-up: Successful Patient Follow-Up  Culture assessed and recommendations reviewed by:  []  Elenor Quinones, Pharm.D. []  Heide Guile, Pharm.D., BCPS AQ-ID [x]  Esmeralda Arthur, Pharm.D., BCPS []  Alycia Rossetti, Pharm.D., BCPS []  [place], Pharm.D., BCPS, AAHIVP []  Legrand Como, Pharm.D., BCPS, AAHIVP []  Salome Arnt, PharmD, BCPS []  Johnnette Gourd, PharmD, BCPS []  Hughes Better, PharmD, BCPS []  Leeroy Cha, PharmD  Positive urine culture  [x]  Patient discharged without antimicrobial prescription and treatment is now indicated []  Organism is resistant to prescribed ED discharge antimicrobial []  Patient with positive blood cultures  Changes discussed with ED provider: Godfrey Pick, MD New antibiotic prescription Fosfomycin 3g every 3 days for 2 doses Called and faxed  to [facility], date 10/06/2022, time 10:38 am   Glennon Hamilton 10/06/2022, 10:39 AM

## 2022-10-13 ENCOUNTER — Encounter: Payer: Self-pay | Admitting: Adult Health

## 2022-10-13 ENCOUNTER — Non-Acute Institutional Stay (INDEPENDENT_AMBULATORY_CARE_PROVIDER_SITE_OTHER): Payer: Medicare HMO | Admitting: Family Medicine

## 2022-10-13 ENCOUNTER — Encounter: Payer: Self-pay | Admitting: Family Medicine

## 2022-10-13 DIAGNOSIS — R102 Pelvic and perineal pain: Secondary | ICD-10-CM

## 2022-10-13 DIAGNOSIS — K6289 Other specified diseases of anus and rectum: Secondary | ICD-10-CM | POA: Insufficient documentation

## 2022-10-13 DIAGNOSIS — B3731 Acute candidiasis of vulva and vagina: Secondary | ICD-10-CM | POA: Insufficient documentation

## 2022-10-13 NOTE — Progress Notes (Signed)
Location:  [place] of Service:   [facility]   CODE STATUS: DNR  Allergies  Allergen Reactions   Ace Inhibitors Other (See Comments)    Hyperkalemia    Codeine    Sulfa Antibiotics     Chief Complaint  Patient presents with   Abdominal Pain    HPI:  [name] has been seen twice at ED for abdominal pain 10/01/22 ED visit CC lower abdominal pain and vaginal or rectal pain. Urinalysis from indwelling catheter which the ED did change out showed and active sediment and postive chem testing   CT AP was unremarkable, including her bladder being decompressed by Foley catheter.  She did have a significant stool burden in the rectum.  Urine culture grew mix of E coli and Klebsiella at 10^4 levels.  Final diagnosis was constipation and unspecific abdominal pain Treated Fosfomycin x 3 by ED, 2/6 and 2/9   Past Medical History:  Diagnosis Date   Adult failure to thrive    Anemia    Anxiety    Atherosclerosis of aorta (HCC)    Central cord syndrome at C4 level of cervical spinal cord, subsequent encounter ([place])    CKD (chronic kidney disease)    stage 3   Depression    DM type 2 with diabetic peripheral neuropathy (HCC)    Dysphagia    GERD (gastroesophageal reflux disease)    Gout    High cholesterol    HTN (hypertension)    Hyponatremia    MDD (major depressive disorder)    Neck pain    Neuropathy    Quadriplegia, C1-C4 incomplete (HCC)    Radiculopathy    RLS (restless legs syndrome)    Urinary retention     Past Surgical History:  Procedure Laterality Date   ABDOMINAL HYSTERECTOMY     ANTERIOR CERVICAL DECOMP/DISCECTOMY FUSION N/A 02/05/2021   Procedure: Cervical Three-Four  Anterior cervical decompression/discectomy/fusion;  Surgeon: Ashok Pall, MD;  Location: [place];  Service: Neurosurgery;  Laterality: N/A;  RM 20   APPENDECTOMY     BACK SURGERY     BIOPSY  09/22/2021   Procedure: BIOPSY;  Surgeon: [name], DO;  [facility];  Service: Endoscopy;;   CERVICAL DISC SURGERY     ESOPHAGOGASTRODUODENOSCOPY (EGD) WITH PROPOFOL N/A 09/22/2021   Procedure: ESOPHAGOGASTRODUODENOSCOPY (EGD) WITH PROPOFOL;  Surgeon: [name], DO;  [facility];  Service: Endoscopy;  Laterality: N/A;  1:00pm   HAND SURGERY     KNEE SURGERY      Social History   Socioeconomic History   Marital status: Widowed    Spouse name: Not on file   Number of children: Not on file   Years of education: Not on file   Highest education level: Not on file  Occupational History   Not on file  Tobacco Use   Smoking status: Never   Smokeless tobacco: Never  Vaping Use   Vaping Use: Never used  Substance and Sexual Activity   Alcohol use: Never   Drug use: Never   Sexual activity: Not Currently  Other Topics Concern   Not on file  Social History Narrative   Lives at [facility]   Social Determinants of Health   Financial Resource Strain: Not on file  Food Insecurity: Not on file  Transportation Needs: Not on file  Physical Activity: Not on file  Stress: Not on file  Social Connections: Not on file  Intimate Partner Violence: Not on file   Family History  Problem Relation Age of Onset   Cancer Mother    Cardiomyopathy Father    Seizures Sister        childhood   Seizures Brother        in his 48s   Colon cancer Neg Hx       VITAL SIGNS BP (!) 151/58   Pulse 70   Temp 97.7 F (36.5 C)   Resp 18   SpO2 91%   Alert, Responsive, coherent  Abdomin: Tender of suprapubic, question bladder dome, Foley bag shows 200 cc.    Outpatient Encounter Medications as of 10/13/2022  Medication Sig   acetaminophen (TYLENOL) 325 MG tablet Take 650 mg by mouth every 8 (eight) hours.   AMBULATORY NON FORMULARY MEDICATION Medication Name:  Continue monthly catheter changes with 31f catheter at skilled nursing facility.   Artificial Saliva (BIOTENE MOISTURIZING MOUTH MT) Use as directed 1  application. in the mouth or throat at bedtime. 9 pm   aspirin EC 81 MG tablet Take 81 mg by mouth daily. Swallow whole.9 am   augmented betamethasone dipropionate (DIPROLENE-AF) 0.05 % ointment Apply 1 Application topically 2 (two) times daily as needed. For stomach and both breast (do not put on face, groin,underarms)   calcium carbonate (TUMS EX) 750 MG chewable tablet Chew 1 tablet by mouth 3 (three) times daily.   camphor-menthol (SARNA) lotion Apply 1 Application topically 2 (two) times daily as needed for itching.   denosumab (PROLIA) 60 MG/ML SOSY injection Inject 60 mg into the skin every 6 (six) months.   DULoxetine (CYMBALTA) 20 MG capsule Take 20 mg by mouth daily. 9 am   fexofenadine (ALLEGRA) 180 MG tablet Take 180 mg by mouth daily.   gabapentin (NEURONTIN) 300 MG capsule Take 300 mg by mouth 3 (three) times daily.   insulin aspart (NOVOLOG FLEXPEN) 100 UNIT/ML FlexPen Inject 5 Units into the skin 3 (three) times daily with meals. 8 am, 12 pm, and 6 pm   Insulin Pen Needle 30G X 5 MM MISC 1 Device by Does not apply route daily. 3/16"   ipratropium-albuterol (DUONEB) 0.5-2.5 (3) MG/3ML SOLN Take 3 mLs by nebulization every 6 (six) hours as needed.   LANTUS SOLOSTAR 100 UNIT/ML Solostar Pen Inject 20 Units into the skin at bedtime.   levETIRAcetam (KEPPRA) 1000 MG tablet Take 1,000 mg by mouth daily. At 9 am, see other listings   levETIRAcetam (KEPPRA) 1000 MG tablet Take 1,000 mg by mouth at bedtime. At 9 pm with 500 mg tablet for a total of 1500 mg at bedtime   levETIRAcetam (KEPPRA) 500 MG tablet Take 500 mg by mouth at bedtime. Along with 1000 mg for a total on 1500 mg in the pm (see other listing)   lidocaine (LIDODERM) 5 % Place onto the skin 2 (two) times daily as needed. Apply to Right shoulder as needed once a morning and remove at bedtime   LORazepam (ATIVAN) 2 MG/ML injection Inject 0.5 mLs (1 mg total) into the vein every 15 (fifteen) minutes as needed.   magnesium  hydroxide (MILK OF MAGNESIA) 400 MG/5ML suspension Take 30 mLs by mouth daily as needed for mild constipation.   melatonin 3 MG TABS tablet Take 3 mg by mouth at bedtime.   Multiple Vitamins-Minerals (THEREMS M PO) (multivitamin with folic acid) tablet; 4A999333mcg; oral Once A Day;   NON FORMULARY Diet - NAS, Cons CHO   omeprazole (PRILOSEC) 20  MG capsule Take 20 mg by mouth 2 (two) times daily before a meal.   rOPINIRole (REQUIP) 1 MG tablet Take 1 mg by mouth at bedtime.   rosuvastatin (CRESTOR) 20 MG tablet Take 20 mg by mouth daily.   tiZANidine (ZANAFLEX) 2 MG tablet Take 2 mg by mouth every 6 (six) hours as needed for muscle spasms (for back and sciatic pain).   zinc oxide 20 % ointment Apply 1 Application topically 3 (three) times daily as needed for irritation (apply every shift).   No facility-administered encounter medications on file as of 10/13/2022.     ASSESSMENT/ PLAN:  Suprapubic pain Question of Foley Blockage Recommendation: Bladder scan    Gwendolyn Edwards NP [facility]  Contact [phone] Monday through Friday 8am- 5pm  After hours call [phone]

## 2022-10-14 ENCOUNTER — Encounter: Payer: Self-pay | Admitting: Adult Health

## 2022-10-14 DIAGNOSIS — Z66 Do not resuscitate: Secondary | ICD-10-CM

## 2022-10-14 NOTE — Progress Notes (Signed)
Location:  [place] Room Number: [place] of Service:  SNF (31)   CODE STATUS: DNR  Allergies  Allergen Reactions   Ace Inhibitors Other (See Comments)    Hyperkalemia    Codeine    Sulfa Antibiotics     Chief Complaint  Patient presents with   Acute Visit    Hallucinations     HPI:    Past Medical History:  Diagnosis Date   Adult failure to thrive    Anemia    Anxiety    Atherosclerosis of aorta ([place])    Central cord syndrome at C4 level of cervical spinal cord, subsequent encounter ([place])    CKD (chronic kidney disease)    stage 3   Depression    DM type 2 with diabetic peripheral neuropathy (HCC)    Dysphagia    GERD (gastroesophageal reflux disease)    Gout    High cholesterol    HTN (hypertension)    Hyponatremia    MDD (major depressive disorder)    Neck pain    Neuropathy    Quadriplegia, C1-C4 incomplete (HCC)    Radiculopathy    RLS (restless legs syndrome)    Urinary retention     Past Surgical History:  Procedure Laterality Date   ABDOMINAL HYSTERECTOMY     ANTERIOR CERVICAL DECOMP/DISCECTOMY FUSION N/A 02/05/2021   Procedure: Cervical Three-Four  Anterior cervical decompression/discectomy/fusion;  Surgeon: Ashok Pall, MD;  Location: [place];  Service: Neurosurgery;  Laterality: N/A;  RM 20   APPENDECTOMY     BACK SURGERY     BIOPSY  09/22/2021   Procedure: BIOPSY;  Surgeon: [name], DO;  [facility];  Service: Endoscopy;;   CERVICAL DISC SURGERY     ESOPHAGOGASTRODUODENOSCOPY (EGD) WITH PROPOFOL N/A 09/22/2021   Procedure: ESOPHAGOGASTRODUODENOSCOPY (EGD) WITH PROPOFOL;  Surgeon: [name], DO;  [facility];  Service: Endoscopy;  Laterality: N/A;  1:00pm   HAND SURGERY     KNEE SURGERY      Social History   Socioeconomic History   Marital status: Widowed    Spouse name: Not on file   Number of children: Not on file   Years of education: Not on file   Highest  education level: Not on file  Occupational History   Not on file  Tobacco Use   Smoking status: Never   Smokeless tobacco: Never  Vaping Use   Vaping Use: Never used  Substance and Sexual Activity   Alcohol use: Never   Drug use: Never   Sexual activity: Not Currently  Other Topics Concern   Not on file  Social History Narrative   Lives at [facility]   Social Determinants of Health   Financial Resource Strain: Not on file  Food Insecurity: Not on file  Transportation Needs: Not on file  Physical Activity: Not on file  Stress: Not on file  Social Connections: Not on file  Intimate Partner Violence: Not on file   Family History  Problem Relation Age of Onset   Cancer Mother    Cardiomyopathy Father    Seizures Sister        childhood   Seizures Brother        in his 37s   Colon cancer Neg Hx       VITAL SIGNS BP (!) 151/58   Pulse 70   Temp 97.7 F (36.5 C)   Ht 5' 2"$  (1.575 m)  Wt 179 lb (81.2 kg)   BMI 32.74 kg/m   Outpatient Encounter Medications as of 10/14/2022  Medication Sig   acetaminophen (TYLENOL) 325 MG tablet Take 650 mg by mouth every 8 (eight) hours.   AMBULATORY NON FORMULARY MEDICATION Medication Name:  Continue monthly catheter changes with 8f catheter at skilled nursing facility.   Artificial Saliva (BIOTENE MOISTURIZING MOUTH MT) Use as directed 1 application. in the mouth or throat at bedtime. 9 pm   aspirin EC 81 MG tablet Take 81 mg by mouth daily. Swallow whole.9 am   betamethasone dipropionate (DIPROLENE) 0.05 % ointment Apply 1 application  topically 2 (two) times daily as needed.   calcium carbonate (TUMS EX) 750 MG chewable tablet Chew 1 tablet by mouth 3 (three) times daily.   camphor-menthol (SARNA) lotion Apply 1 Application topically 2 (two) times daily as needed for itching.   dapagliflozin propanediol (FARXIGA) 5 MG TABS tablet Take 5 mg by mouth daily.   denosumab (PROLIA) 60 MG/ML SOSY injection Inject 60 mg into  the skin every 6 (six) months.   DULoxetine (CYMBALTA) 20 MG capsule Take 20 mg by mouth daily. 9 am   fexofenadine (ALLEGRA) 180 MG tablet Take 180 mg by mouth daily.   gabapentin (NEURONTIN) 300 MG capsule Take 300 mg by mouth 3 (three) times daily.   insulin aspart (NOVOLOG FLEXPEN) 100 UNIT/ML FlexPen Inject 5 Units into the skin 3 (three) times daily with meals. 8 am, 12 pm, and 6 pm   Insulin Pen Needle 30G X 5 MM MISC 1 Device by Does not apply route daily. 3/16"   ipratropium-albuterol (DUONEB) 0.5-2.5 (3) MG/3ML SOLN Take 3 mLs by nebulization every 6 (six) hours as needed.   LANTUS SOLOSTAR 100 UNIT/ML Solostar Pen Inject 20 Units into the skin at bedtime.   levETIRAcetam (KEPPRA) 1000 MG tablet Take 1,000 mg by mouth daily. At 9 am, see other listings   levETIRAcetam (KEPPRA) 1000 MG tablet Take 1,000 mg by mouth at bedtime. At 9 pm with 500 mg tablet for a total of 1500 mg at bedtime   levETIRAcetam (KEPPRA) 500 MG tablet Take 500 mg by mouth at bedtime. Along with 1000 mg for a total on 1500 mg in the pm (see other listing)   lidocaine (LIDODERM) 5 % Place onto the skin 2 (two) times daily as needed. Apply to Right shoulder as needed once a morning and remove at bedtime   LORazepam (ATIVAN) 2 MG/ML injection Inject 0.5 mLs (1 mg total) into the vein every 15 (fifteen) minutes as needed.   magnesium hydroxide (MILK OF MAGNESIA) 400 MG/5ML suspension Take 30 mLs by mouth daily as needed for mild constipation.   melatonin 3 MG TABS tablet Take 3 mg by mouth at bedtime.   Multiple Vitamins-Minerals (THEREMS M PO) (multivitamin with folic acid) tablet; 4A999333mcg; oral Once A Day;   NON FORMULARY Diet - NAS, Cons CHO   nystatin powder Apply 1 Application topically 2 (two) times daily as needed.   omeprazole (PRILOSEC) 20 MG capsule Take 20 mg by mouth 2 (two) times daily before a meal.   Phenylephrine-Cocoa Butter 0.25-85.5 % SUPP Place rectally.   polyethylene glycol (MIRALAX / GLYCOLAX) 17  g packet Take 17 g by mouth daily.   rOPINIRole (REQUIP) 1 MG tablet Take 1 mg by mouth at bedtime.   rosuvastatin (CRESTOR) 20 MG tablet Take 20 mg by mouth daily.   tiZANidine (ZANAFLEX) 2 MG tablet Take 2 mg by mouth every  6 (six) hours as needed for muscle spasms (for back and sciatic pain).   zinc oxide 20 % ointment Apply 1 Application topically 3 (three) times daily as needed for irritation (apply every shift).   No facility-administered encounter medications on file as of 10/14/2022.     SIGNIFICANT DIAGNOSTIC EXAMS       ASSESSMENT/ PLAN:     [name] NP [facility]  Contact [phone] Monday through Friday 8am- 5pm  After hours call [phone]

## 2022-10-19 ENCOUNTER — Encounter: Payer: Self-pay | Admitting: Adult Health

## 2022-10-19 ENCOUNTER — Non-Acute Institutional Stay (SKILLED_NURSING_FACILITY): Payer: Medicare HMO | Admitting: Adult Health

## 2022-10-19 ENCOUNTER — Other Ambulatory Visit (HOSPITAL_COMMUNITY)
Admission: RE | Admit: 2022-10-19 | Discharge: 2022-10-19 | Disposition: A | Discharge status: Home / Self Care | Payer: Medicare HMO | Source: Skilled Nursing Facility | Attending: Adult Health | Admitting: Adult Health

## 2022-10-19 DIAGNOSIS — I129 Hypertensive chronic kidney disease with stage 1 through stage 4 chronic kidney disease, or unspecified chronic kidney disease: Secondary | ICD-10-CM | POA: Diagnosis not present

## 2022-10-19 DIAGNOSIS — E875 Hyperkalemia: Secondary | ICD-10-CM | POA: Diagnosis not present

## 2022-10-19 DIAGNOSIS — Z794 Long term (current) use of insulin: Secondary | ICD-10-CM | POA: Diagnosis not present

## 2022-10-19 DIAGNOSIS — E1140 Type 2 diabetes mellitus with diabetic neuropathy, unspecified: Secondary | ICD-10-CM | POA: Diagnosis not present

## 2022-10-19 LAB — BASIC METABOLIC PANEL
Anion gap: 8 (ref 5–15)
BUN: 45 mg/dL — ABNORMAL HIGH (ref 8–23)
CO2: 19 mmol/L — ABNORMAL LOW (ref 22–32)
Calcium: 8.9 mg/dL (ref 8.9–10.3)
Chloride: 107 mmol/L (ref 98–111)
Creatinine, Ser: 1.63 mg/dL — ABNORMAL HIGH (ref 0.44–1.00)
GFR, Estimated: 31 mL/min — ABNORMAL LOW (ref >60)
Glucose, Bld: 176 mg/dL — ABNORMAL HIGH (ref 70–99)
Potassium: 5.8 mmol/L — ABNORMAL HIGH (ref 3.5–5.1)
Sodium: 134 mmol/L — ABNORMAL LOW (ref 135–145)

## 2022-10-19 NOTE — Progress Notes (Signed)
Location:  [place] Room Number: [address] of Service:  SNF (31)   CODE STATUS: dnr  Allergies  Allergen Reactions   Ace Inhibitors Other (See Comments)    Hyperkalemia    Codeine    Sulfa Antibiotics     Chief Complaint  Patient presents with   Acute Visit    Follow up labs and diabetes     HPI:  Her k+  level is elevated at 5.2.  her cbg readings are all elevated at 100-300's. She is presently taking lantus 20 units nightly and novolog 5 units with meals. There are no reports of missed doses. No reports of symptomatic hyperglycemia.   Past Medical History:  Diagnosis Date   Adult failure to thrive    Anemia    Anxiety    Atherosclerosis of aorta (HCC)    Central cord syndrome at C4 level of cervical spinal cord, subsequent encounter ([place])    CKD (chronic kidney disease)    stage 3   Depression    DM type 2 with diabetic peripheral neuropathy (HCC)    Dysphagia    GERD (gastroesophageal reflux disease)    Gout    High cholesterol    HTN (hypertension)    Hyponatremia    MDD (major depressive disorder)    Neck pain    Neuropathy    Quadriplegia, C1-C4 incomplete (HCC)    Radiculopathy    RLS (restless legs syndrome)    Urinary retention     Past Surgical History:  Procedure Laterality Date   ABDOMINAL HYSTERECTOMY     ANTERIOR CERVICAL DECOMP/DISCECTOMY FUSION N/A 02/05/2021   Procedure: Cervical Three-Four  Anterior cervical decompression/discectomy/fusion;  Surgeon: Ashok Pall, MD;  Location: [place];  Service: Neurosurgery;  Laterality: N/A;  RM 20   APPENDECTOMY     BACK SURGERY     BIOPSY  09/22/2021   Procedure: BIOPSY;  Surgeon: [name], DO;  [facility];  Service: Endoscopy;;   CERVICAL DISC SURGERY     ESOPHAGOGASTRODUODENOSCOPY (EGD) WITH PROPOFOL N/A 09/22/2021   Procedure: ESOPHAGOGASTRODUODENOSCOPY (EGD) WITH PROPOFOL;  Surgeon: [name], DO;  [facility];  Service:  Endoscopy;  Laterality: N/A;  1:00pm   HAND SURGERY     KNEE SURGERY      Social History   Socioeconomic History   Marital status: Widowed    Spouse name: Not on file   Number of children: Not on file   Years of education: Not on file   Highest education level: Not on file  Occupational History   Not on file  Tobacco Use   Smoking status: Never   Smokeless tobacco: Never  Vaping Use   Vaping Use: Never used  Substance and Sexual Activity   Alcohol use: Never   Drug use: Never   Sexual activity: Not Currently  Other Topics Concern   Not on file  Social History Narrative   Lives at [facility]   Social Determinants of Health   Financial Resource Strain: Not on file  Food Insecurity: Not on file  Transportation Needs: Not on file  Physical Activity: Not on file  Stress: Not on file  Social Connections: Not on file  Intimate Partner Violence: Not on file   Family History  Problem Relation Age of Onset   Cancer Mother    Cardiomyopathy Father    Seizures Sister        childhood   Seizures Brother  in his 31s   Colon cancer Neg Hx       VITAL SIGNS BP 130/68   Pulse 75   Temp 98.2 F (36.8 C)   Resp 20   Ht '5\' 2"'$  (1.575 m)   Wt 179 lb 12.8 oz (81.6 kg)   SpO2 97%   BMI 32.89 kg/m   Outpatient Encounter Medications as of 10/19/2022  Medication Sig   acetaminophen (TYLENOL) 325 MG tablet Take 650 mg by mouth every 8 (eight) hours.   AMBULATORY NON FORMULARY MEDICATION Medication Name:  Continue monthly catheter changes with 5f catheter at skilled nursing facility.   Artificial Saliva (BIOTENE MOISTURIZING MOUTH MT) Use as directed 1 application. in the mouth or throat at bedtime. 9 pm   aspirin EC 81 MG tablet Take 81 mg by mouth daily. Swallow whole.9 am   betamethasone dipropionate (DIPROLENE) 0.05 % ointment Apply 1 application  topically 2 (two) times daily as needed.   calcium carbonate (TUMS EX) 750 MG chewable tablet Chew 1 tablet  by mouth 3 (three) times daily.   camphor-menthol (SARNA) lotion Apply 1 Application topically 2 (two) times daily as needed for itching.   dapagliflozin propanediol (FARXIGA) 5 MG TABS tablet Take 5 mg by mouth daily.   denosumab (PROLIA) 60 MG/ML SOSY injection Inject 60 mg into the skin every 6 (six) months.   DULoxetine (CYMBALTA) 20 MG capsule Take 20 mg by mouth daily. 9 am   fexofenadine (ALLEGRA) 180 MG tablet Take 180 mg by mouth daily.   gabapentin (NEURONTIN) 300 MG capsule Take 300 mg by mouth 3 (three) times daily.   insulin aspart (NOVOLOG FLEXPEN) 100 UNIT/ML FlexPen Inject 5 Units into the skin 3 (three) times daily with meals. 8 am, 12 pm, and 6 pm   Insulin Pen Needle 30G X 5 MM MISC 1 Device by Does not apply route daily. 3/16"   ipratropium-albuterol (DUONEB) 0.5-2.5 (3) MG/3ML SOLN Take 3 mLs by nebulization every 6 (six) hours as needed.   LANTUS SOLOSTAR 100 UNIT/ML Solostar Pen Inject 20 Units into the skin at bedtime.   levETIRAcetam (KEPPRA) 1000 MG tablet Take 1,000 mg by mouth daily. At 9 am, see other listings   levETIRAcetam (KEPPRA) 1000 MG tablet Take 1,000 mg by mouth at bedtime. At 9 pm with 500 mg tablet for a total of 1500 mg at bedtime   levETIRAcetam (KEPPRA) 500 MG tablet Take 500 mg by mouth at bedtime. Along with 1000 mg for a total on 1500 mg in the pm (see other listing)   lidocaine (LIDODERM) 5 % Place onto the skin 2 (two) times daily as needed. Apply to Right shoulder as needed once a morning and remove at bedtime   LORazepam (ATIVAN) 2 MG/ML injection Inject 0.5 mLs (1 mg total) into the vein every 15 (fifteen) minutes as needed.   magnesium hydroxide (MILK OF MAGNESIA) 400 MG/5ML suspension Take 30 mLs by mouth daily as needed for mild constipation.   melatonin 3 MG TABS tablet Take 3 mg by mouth at bedtime.   Multiple Vitamins-Minerals (THEREMS M PO) (multivitamin with folic acid) tablet; 4A999333mcg; oral Once A Day;   NON FORMULARY Diet - NAS, Cons  CHO   nystatin powder Apply 1 Application topically 2 (two) times daily as needed.   omeprazole (PRILOSEC) 20 MG capsule Take 20 mg by mouth 2 (two) times daily before a meal.   Phenylephrine-Cocoa Butter 0.25-85.5 % SUPP Place rectally.   polyethylene glycol (MIRALAX / GLYCOLAX)  17 g packet Take 17 g by mouth daily.   rOPINIRole (REQUIP) 1 MG tablet Take 1 mg by mouth at bedtime.   rosuvastatin (CRESTOR) 20 MG tablet Take 20 mg by mouth daily.   tiZANidine (ZANAFLEX) 2 MG tablet Take 2 mg by mouth every 6 (six) hours as needed for muscle spasms (for back and sciatic pain).   zinc oxide 20 % ointment Apply 1 Application topically 3 (three) times daily as needed for irritation (apply every shift).   No facility-administered encounter medications on file as of 10/19/2022.     SIGNIFICANT DIAGNOSTIC EXAMS  PREVIOUS   04-15-22; dexa: t score -3.391  NO NEW EXAMS     LABS REVIEWED PREVIOUS   10-17-21: wbc 5.9; hgb 8.9; hct 26.6; mcv 95.3 plt 223; glucose 171; bun 44; creat 1.31;k+ 4.8; na++ 134; ca 8.6; gfr 40. D-dimer 1.21; crp 3.2  10-27-21: hgb a1c 6.5 11-04-21: CRp 21.2; d-dimer 1.46 11-24-21: wbc 5.4; hgb 8.7; hct 27.5; mcv 94.2 plt 218; sed rate 80; ana: neg; crp 0.6 01-15-22: hgb a1c 6.7 chol 148; ldl 87; trig 112; hdl 39; tsh 5.663  02-11-22: urine culture: multiple bacteria  04-09-22: liver normal protein 6.0 albumin 3.3; chol 143; ldl 81; trig 71; hdl 48; tsh 3.502  04-27-22; wbc 6.6; hgb 8.7; hct 26.2; mcv 94.6 plt 231; glucose 152; bun 66; creat 1.26; k+ 4.7; na++ 132; ca 8.9; gfr 42; protein 6.7 albumin 3.4 hgb a1c 7.0; vitamin D 37.96 07-09-22: wbc 9.3; hgb 8.5; hct 26.9; mcv 96.8 plt 196; glucose 200; bun 65; creat 2.15; k+ 5.0; na++ 128; ca 7.8; gfr 22 07-10-22: glucose 130; bun 59; creat 1.75; k+ 4.2; na++ 131; ca 7.6; gfr 29 07-30-22: urine micro-albumin 59.7; ACR 127   08-20-22: hgb A1c 8.8  TODAY  10-01-22: wbc 5.6; hgb 9.6; hct 29.9; mcv 94.9 plt 245; glucose 404; bun 43;  creat 1.69; k+ 5.2; na++ 135; ca 8.2 gfr 30 protein 7.2 albumin 3.5     Review of Systems  Constitutional:  Negative for malaise/fatigue.  Respiratory:  Negative for cough and shortness of breath.   Cardiovascular:  Negative for chest pain, palpitations and leg swelling.  Gastrointestinal:  Positive for abdominal pain. Negative for constipation and heartburn.  Musculoskeletal:  Negative for back pain, joint pain and myalgias.  Skin: Negative.   Neurological:  Negative for dizziness.  Psychiatric/Behavioral:  The patient is not nervous/anxious.     Physical Exam Constitutional:      General: She is not in acute distress.    Appearance: She is well-developed. She is not diaphoretic.  Neck:     Thyroid: No thyromegaly.  Cardiovascular:     Rate and Rhythm: Normal rate and regular rhythm.     Pulses: Normal pulses.     Heart sounds: Normal heart sounds.  Pulmonary:     Effort: Pulmonary effort is normal. No respiratory distress.     Breath sounds: Normal breath sounds.  Abdominal:     General: Bowel sounds are normal. There is no distension.     Palpations: Abdomen is soft.     Tenderness: There is no abdominal tenderness.  Musculoskeletal:     Cervical back: Neck supple.     Right lower leg: No edema.     Left lower leg: No edema.     Comments: Does not move lower extremities   Lymphadenopathy:     Cervical: No cervical adenopathy.  Skin:    General: Skin is warm and dry.  Neurological:     Mental Status: She is alert and oriented to person, place, and time.  Psychiatric:        Mood and Affect: Mood normal.       ASSESSMENT/ PLAN:  TODAY  Hyperkalemia: for k+ 5.2 will give lokelma 10 gm one time  Type 2 diabetes mellitus with diabetic neuropathy with long term current use of insulin: will increase lantus to 25 units nightly     [name] NP [facility]  call [phone]

## 2022-10-19 NOTE — Progress Notes (Signed)
This encounter was created in error - please disregard.

## 2022-10-20 ENCOUNTER — Ambulatory Visit (INDEPENDENT_AMBULATORY_CARE_PROVIDER_SITE_OTHER): Payer: Medicare HMO | Admitting: Gastroenterology

## 2022-10-20 ENCOUNTER — Encounter: Payer: Self-pay | Admitting: Gastroenterology

## 2022-10-20 VITALS — BP 129/69 | HR 63 | Temp 97.1°F | Ht 62.0 in

## 2022-10-20 DIAGNOSIS — R630 Anorexia: Secondary | ICD-10-CM

## 2022-10-20 DIAGNOSIS — K6289 Other specified diseases of anus and rectum: Secondary | ICD-10-CM

## 2022-10-20 DIAGNOSIS — R1310 Dysphagia, unspecified: Secondary | ICD-10-CM | POA: Diagnosis not present

## 2022-10-20 DIAGNOSIS — D649 Anemia, unspecified: Secondary | ICD-10-CM | POA: Diagnosis not present

## 2022-10-20 DIAGNOSIS — R109 Unspecified abdominal pain: Secondary | ICD-10-CM

## 2022-10-20 DIAGNOSIS — K5900 Constipation, unspecified: Secondary | ICD-10-CM | POA: Diagnosis not present

## 2022-10-20 DIAGNOSIS — K219 Gastro-esophageal reflux disease without esophagitis: Secondary | ICD-10-CM

## 2022-10-20 MED ORDER — OMEPRAZOLE 40 MG PO CPDR: 40.0000 mg | DELAYED_RELEASE_CAPSULE | Freq: Two times a day (BID) | ORAL | 3 refills | Status: DC

## 2022-10-20 MED ORDER — LINACLOTIDE 72 MCG PO CAPS: 72.0000 ug | ORAL_CAPSULE | Freq: Every day | ORAL | 2 refills | Status: DC

## 2022-10-20 NOTE — Patient Instructions (Signed)
Start Linzess 72 mcg once daily. Continue miralax once daily.   Increase omeprazole to 40 mg twice daily.   We will schedule you for an EGD per your request to evaluate your upper abdominal pain and dysphagia.   Follow a GERD diet:  Avoid fried, fatty, greasy, spicy, citrus foods. Avoid caffeine and carbonated beverages. Avoid chocolate. Try eating 4-6 small meals a day rather than 3 large meals. Do not eat within 3 hours of laying down. Prop head of bed up on wood or bricks to create a 6 inch incline.   It was a pleasure to see you today. I want to create trusting relationships with patients. If you receive a survey regarding your visit,  I greatly appreciate you taking time to fill this out on paper or through your MyChart. I value your feedback.  Venetia Night, MSN, FNP-BC, AGACNP-BC [facility]

## 2022-10-20 NOTE — Progress Notes (Signed)
GI Office Note    Referring Provider: Gerlene Fee, NP Primary Care Physician:  Gerlene Fee, NP Primary Gastroenterologist: Elon Alas. Abbey Chatters, DO  Date:  10/20/2022  ID:  [name], DOB 1935/11/09, MRN [id number]   Chief Complaint   Chief Complaint  Patient presents with   Rectal Pain    Patient here today with complaints of rectal pain, and left mid abdominal pain at times it is upper abdominal pain, and some times it is lower left. Denies any sight of dark or bloody stools,but is at [facility] and says no one has mentioned seeing blood or dark stools.     History of Present Illness  [name] is a 87 y.o. female with a history of anemia, diabetes type 2, HTN, HLD, central cord syndrome at C4 following a fall in May 2022, CKD presenting today with complaint of rectal pain and abdominal pain.   Last office visit 08/28/2021: C/o N/V, early satiety and reported weight loss. Queried gastroparesis. Scheduled for EGD. Anemia and rectal wall thickening secondary to previous constipation. Pt declined colonoscopy. Noted chronic history of reflux controlled with omeprazole 40 mg daily. Incontinent of BM therefore unsure about weight loss/gain. Labs ordered and pt scheduled for EGD. Abdominal US with elastography due to abnormal CT findings.   EGD 09/22/21: -gastritis s/p biopsy -normal duodenum -omeprazole 40 mg daily -Reglan 5 mg QID  Two ED visits recently for constipation and abdominal pain earlier this month. CT as outlined below. Treated with soap suds enema one instance. No changes to bowel regimen made, continue metamucil.  CT A/P 10/01/22: -mild fullness in right renal collecting system -changes consistent with rectal impaction  LFTs normal. Hgb 9.6, Glucose 404 on 10/01/22.   Labs 10/19/22: Cr 1.63, BUN 45, GFR 31, Glucose improved. Sodium 134, Potassium 5.8,   Today:  Abdominal pain: Gets sharp pains that occur. Daughter reports she has been having issues  since she fell from steps and landed face downs and ha been unable to walk since then. Pian is all over at different times. At times the pain is sharp, unable to say how long it lasts. Starts in the lower left and radiates up to her left upper abdomen. Does have some nausea but no vomiting. Does gag at times. Does have some burning and some mild dysphagia. Has to crush most of her medications due to trouble swallowing. Patient unsure about her bowel movements and how often she is going. No reports of blood or melena per patient or daughter. Has a poor appetite due to pain and likely also due to change in food choices in the SNF. Has been at [facility] for 2 years. Does have some bloating and some belching.   Rectal pain intermittent.    Current Outpatient Medications  Medication Sig Dispense Refill   acetaminophen (TYLENOL) 325 MG tablet Take 650 mg by mouth every 8 (eight) hours.     Artificial Saliva (BIOTENE MOISTURIZING MOUTH MT) Use as directed 1 application. in the mouth or throat at bedtime. 9 pm     aspirin EC 81 MG tablet Take 81 mg by mouth daily. Swallow whole.9 am     betamethasone dipropionate (DIPROLENE) 0.05 % ointment Apply 1 application  topically 2 (two) times daily as needed.     calcium carbonate (TUMS EX) 750 MG chewable tablet Chew 1 tablet by mouth 3 (three) times daily.     camphor-menthol (SARNA) lotion Apply 1 Application topically 2 (two) times  daily as needed for itching.     dapagliflozin propanediol (FARXIGA) 5 MG TABS tablet Take 5 mg by mouth daily.     denosumab (PROLIA) 60 MG/ML SOSY injection Inject 60 mg into the skin every 6 (six) months.     DULoxetine (CYMBALTA) 20 MG capsule Take 20 mg by mouth daily. 9 am     fexofenadine (ALLEGRA) 180 MG tablet Take 180 mg by mouth daily.     fluconazole (DIFLUCAN) 200 MG tablet Take 200 mg by mouth at bedtime.     fosfomycin (MONUROL) 3 g PACK Take 3 g by mouth. Q 72 hours     gabapentin (NEURONTIN) 300 MG capsule Take  300 mg by mouth 3 (three) times daily.     guaiFENesin (MUCINEX) 600 MG 12 hr tablet Take 600 mg by mouth 2 (two) times daily.     insulin aspart (NOVOLOG FLEXPEN) 100 UNIT/ML FlexPen Inject 5 Units into the skin 3 (three) times daily with meals. 8 am, 12 pm, and 6 pm     Insulin Pen Needle 30G X 5 MM MISC 1 Device by Does not apply route daily. 3/16"  0   ipratropium-albuterol (DUONEB) 0.5-2.5 (3) MG/3ML SOLN Take 3 mLs by nebulization every 6 (six) hours as needed.     LANTUS SOLOSTAR 100 UNIT/ML Solostar Pen Inject 25 Units into the skin at bedtime.     levETIRAcetam (KEPPRA) 1000 MG tablet Take 1,000 mg by mouth daily. At 9 am, see other listings     levETIRAcetam (KEPPRA) 1000 MG tablet Take 1,000 mg by mouth at bedtime. At 9 pm with 500 mg tablet for a total of 1500 mg at bedtime     levETIRAcetam (KEPPRA) 500 MG tablet Take 500 mg by mouth at bedtime. Along with 1000 mg for a total on 1500 mg in the pm (see other listing)     lidocaine (LIDODERM) 5 % Place onto the skin 2 (two) times daily as needed. Apply to Right shoulder as needed once a morning and remove at bedtime     LORazepam (ATIVAN) 2 MG/ML injection Inject 0.5 mLs (1 mg total) into the vein every 15 (fifteen) minutes as needed. 4 mL 0   melatonin 3 MG TABS tablet Take 3 mg by mouth at bedtime.     Multiple Vitamins-Minerals (THEREMS M PO) (multivitamin with folic acid) tablet; [id number] mcg; oral Once A Day;     nystatin powder Apply 1 Application topically 2 (two) times daily as needed.     omeprazole (PRILOSEC) 20 MG capsule Take 20 mg by mouth 2 (two) times daily before a meal.     Phenylephrine-Cocoa Butter 0.25-85.5 % SUPP Place rectally.     polyethylene glycol (MIRALAX / GLYCOLAX) 17 g packet Take 17 g by mouth daily.     rOPINIRole (REQUIP) 1 MG tablet Take 1 mg by mouth at bedtime.     rosuvastatin (CRESTOR) 20 MG tablet Take 20 mg by mouth daily.     sodium zirconium cyclosilicate (LOKELMA) 10 g PACK packet Take 10 g by mouth  once.     tiZANidine (ZANAFLEX) 2 MG tablet Take 2 mg by mouth every 6 (six) hours as needed for muscle spasms (for back and sciatic pain).     zinc oxide 20 % ointment Apply 1 Application topically 3 (three) times daily as needed for irritation (apply every shift).     AMBULATORY NON FORMULARY MEDICATION Medication Name:  Continue monthly catheter changes with 40f catheter at skilled  nursing facility. 1 application MONTHLY   magnesium hydroxide (MILK OF MAGNESIA) 400 MG/5ML suspension Take 30 mLs by mouth daily as needed for mild constipation.     NON FORMULARY Diet - NAS, Cons CHO     No current facility-administered medications for this visit.    Past Medical History:  Diagnosis Date   Adult failure to thrive    Anemia    Anxiety    Atherosclerosis of aorta (HCC)    Central cord syndrome at C4 level of cervical spinal cord, subsequent encounter ([place])    CKD (chronic kidney disease)    stage 3   Depression    DM type 2 with diabetic peripheral neuropathy (HCC)    Dysphagia    GERD (gastroesophageal reflux disease)    Gout    High cholesterol    HTN (hypertension)    Hyponatremia    MDD (major depressive disorder)    Neck pain    Neuropathy    Quadriplegia, C1-C4 incomplete (HCC)    Radiculopathy    RLS (restless legs syndrome)    Urinary retention     Past Surgical History:  Procedure Laterality Date   ABDOMINAL HYSTERECTOMY     ANTERIOR CERVICAL DECOMP/DISCECTOMY FUSION N/A 02/05/2021   Procedure: Cervical Three-Four  Anterior cervical decompression/discectomy/fusion;  Surgeon: Ashok Pall, MD;  Location: [place];  Service: Neurosurgery;  Laterality: N/A;  RM 20   APPENDECTOMY     BACK SURGERY     BIOPSY  09/22/2021   Procedure: BIOPSY;  Surgeon: [name], DO;  Location: [facility];  Service: Endoscopy;;   CERVICAL DISC SURGERY     ESOPHAGOGASTRODUODENOSCOPY (EGD) WITH PROPOFOL N/A 09/22/2021   Procedure: ESOPHAGOGASTRODUODENOSCOPY (EGD) WITH PROPOFOL;   Surgeon: [name], DO;  Location: [facility];  Service: Endoscopy;  Laterality: N/A;  1:00pm   HAND SURGERY     KNEE SURGERY      Family History  Problem Relation Age of Onset   Cancer Mother    Cardiomyopathy Father    Seizures Sister        childhood   Seizures Brother        in his 59s   Colon cancer Neg Hx     Allergies as of 10/20/2022 - Review Complete 10/20/2022  Allergen Reaction Noted   Ace inhibitors Other (See Comments) 08/29/2021   Codeine  03/09/2018   Sulfa antibiotics  03/25/2021    Social History   Socioeconomic History   Marital status: Widowed    Spouse name: Not on file   Number of children: Not on file   Years of education: Not on file   Highest education level: Not on file  Occupational History   Not on file  Tobacco Use   Smoking status: Never   Smokeless tobacco: Never  Vaping Use   Vaping Use: Never used  Substance and Sexual Activity   Alcohol use: Never   Drug use: Never   Sexual activity: Not Currently  Other Topics Concern   Not on file  Social History Narrative   Lives at [facility]   Social Determinants of Health   Financial Resource Strain: Not on file  Food Insecurity: Not on file  Transportation Needs: Not on file  Physical Activity: Not on file  Stress: Not on file  Social Connections: Not on file     Review of Systems   Gen: Denies fever, chills, anorexia. Denies fatigue, weakness, weight loss.  CV: Denies chest pain,  palpitations, syncope, peripheral edema, and claudication. Resp: Denies dyspnea at rest, cough, wheezing, coughing up blood, and pleurisy. GI: See HPI Derm: Denies rash, itching, dry skin Psych: Denies depression, anxiety, memory loss, confusion. No homicidal or suicidal ideation.  Heme: Denies bruising, bleeding, and enlarged lymph nodes.   Physical Exam   BP 129/69 (BP Location: Left Arm, Patient Position: Sitting, Cuff Size: Small)   Pulse 63   Temp (!) 97.1 F (36.2 C)  (Temporal)   Ht '5\' 2"'$  (1.575 m)   BMI 32.89 kg/m   General:   Alert and oriented. No distress noted. Pleasant and cooperative.  Head:  Normocephalic and atraumatic. Eyes:  Conjuctiva clear without scleral icterus. Mouth:  Oral mucosa pink and moist. Good dentition. No lesions. Lungs:  Clear to auscultation bilaterally. No wheezes, rales, or rhonchi. No distress.  Heart:  S1, S2 present without murmurs appreciated.  Abdomen:  +BS, soft, mild distention. TTP to LLQ, LUQ, mid abdomen. No rebound or guarding. No HSM or masses noted. Rectal: unable to assess, in wheelchair and poor mobility. Patient in wheelchair Msk:  Symmetrical without gross deformities. Normal posture. Extremities:  Without edema. Neurologic:  Alert and  oriented x4 Psych:  Alert and cooperative. Normal mood and affect.   Assessment  [name] is a 87 y.o. female with a history of  anemia, diabetes type 2, HTN, HLD, central cord syndrome at C4 following a fall in May 2022, CKD presenting today with complaint of rectal pain and abdominal pain.   Anemia: Likely anemia of chronic disease. No prior colonoscopy on file and patient has previously declined colonoscopy. No reports of melena or brbpr. Patient denies weight loss but also has not been weighed in a while. Hgb stable. Appears to be within patients baseline which is 8-9.   Abdominal pain, early satiety, poor appetite: Patient having diffuse abdominal pain worse to the LLQ. Symptoms fairly consistent with uncontrolled reflux and constipation (see below). Nothing to explain weight loss noted on recent CT A/P. Early satiety and poor appetite is likely also likely due to change over time since her injury . LLQ pain likely in the setting of constipation and possible fecal impaction.   Constipation: Patient reports incontinence given cord injury and is not sure how often she goes. Reports occasional rectal pain along with lower abdominal pain. Patient somewhat difficult  historian. Only taking metamucil and miralax. Recent CT with evidence of fecal impaction. Advised to add linzess to regimen of miralax and metamucil. Given cord injury recommended suppository at least once weekly.   GERD, dysphagia: Having upper abdominal pain and some burning and mild dysphagia. Having to crush most meds or take with applesauce. Advised to increase PPI to BID. Discussed GERD diet. Patient requesting repeat EGD. Last EGD and recommendations reviewed with patient and is insistent about  repeating EGD. Will schedule per patients request.  I have discussed the risks, alternatives, benefits with regards to but not limited to the risk of reaction to medication, bleeding, infection, perforation and the patient is agreeable to proceed. Written consent to be obtained.    PLAN   Continue miralax once daily Add Linzess 72 mcg daily.  Suppository once weekly.  EGD with possible dilation: ASA 3 per patient request(call daughter to schedule) Hold farxiga for 3 days 1/2 normal dose of lantus night prior Increase omeprazole to 40 mg twice daily from 20 mg twice daily.  GERD diet Follow up in 2 months.     Venetia Night, MSN, FNP-BC,  AGACNP-BC [facility]

## 2022-10-20 NOTE — H&P (View-Only) (Signed)
 GI Office Note    Referring Provider: Green, Deborah S, NP Primary Care Physician:  Green, Deborah S, NP Primary Gastroenterologist: Charles K. Carver, DO  Date:  10/20/2022  ID:  [name], DOB 11/24/1935, MRN [id number]   Chief Complaint   Chief Complaint  Patient presents with   Rectal Pain    Patient here today with complaints of rectal pain, and left mid abdominal pain at times it is upper abdominal pain, and some times it is lower left. Denies any sight of dark or bloody stools,but is at [facility] and says no one has mentioned seeing blood or dark stools.     History of Present Illness  [name] is a 86 y.o. female with a history of anemia, diabetes type 2, HTN, HLD, central cord syndrome at C4 following a fall in May 2022, CKD presenting today with complaint of rectal pain and abdominal pain.   Last office visit 08/28/2021: C/o N/V, early satiety and reported weight loss. Queried gastroparesis. Scheduled for EGD. Anemia and rectal wall thickening secondary to previous constipation. Pt declined colonoscopy. Noted chronic history of reflux controlled with omeprazole 40 mg daily. Incontinent of BM therefore unsure about weight loss/gain. Labs ordered and pt scheduled for EGD. Abdominal US with elastography due to abnormal CT findings.   EGD 09/22/21: -gastritis s/p biopsy -normal duodenum -omeprazole 40 mg daily -Reglan 5 mg QID  Two ED visits recently for constipation and abdominal pain earlier this month. CT as outlined below. Treated with soap suds enema one instance. No changes to bowel regimen made, continue metamucil.  CT A/P 10/01/22: -mild fullness in right renal collecting system -changes consistent with rectal impaction  LFTs normal. Hgb 9.6, Glucose 404 on 10/01/22.   Labs 10/19/22: Cr 1.63, BUN 45, GFR 31, Glucose improved. Sodium 134, Potassium 5.8,   Today:  Abdominal pain: Gets sharp pains that occur. Daughter reports she has been having issues  since she fell from steps and landed face downs and ha been unable to walk since then. Pian is all over at different times. At times the pain is sharp, unable to say how long it lasts. Starts in the lower left and radiates up to her left upper abdomen. Does have some nausea but no vomiting. Does gag at times. Does have some burning and some mild dysphagia. Has to crush most of her medications due to trouble swallowing. Patient unsure about her bowel movements and how often she is going. No reports of blood or melena per patient or daughter. Has a poor appetite due to pain and likely also due to change in food choices in the SNF. Has been at [facility] for 2 years. Does have some bloating and some belching.   Rectal pain intermittent.    Current Outpatient Medications  Medication Sig Dispense Refill   acetaminophen (TYLENOL) 325 MG tablet Take 650 mg by mouth every 8 (eight) hours.     Artificial Saliva (BIOTENE MOISTURIZING MOUTH MT) Use as directed 1 application. in the mouth or throat at bedtime. 9 pm     aspirin EC 81 MG tablet Take 81 mg by mouth daily. Swallow whole.9 am     betamethasone dipropionate (DIPROLENE) 0.05 % ointment Apply 1 application  topically 2 (two) times daily as needed.     calcium carbonate (TUMS EX) 750 MG chewable tablet Chew 1 tablet by mouth 3 (three) times daily.     camphor-menthol (SARNA) lotion Apply 1 Application topically 2 (two) times   daily as needed for itching.     dapagliflozin propanediol (FARXIGA) 5 MG TABS tablet Take 5 mg by mouth daily.     denosumab (PROLIA) 60 MG/ML SOSY injection Inject 60 mg into the skin every 6 (six) months.     DULoxetine (CYMBALTA) 20 MG capsule Take 20 mg by mouth daily. 9 am     fexofenadine (ALLEGRA) 180 MG tablet Take 180 mg by mouth daily.     fluconazole (DIFLUCAN) 200 MG tablet Take 200 mg by mouth at bedtime.     fosfomycin (MONUROL) 3 g PACK Take 3 g by mouth. Q 72 hours     gabapentin (NEURONTIN) 300 MG capsule Take  300 mg by mouth 3 (three) times daily.     guaiFENesin (MUCINEX) 600 MG 12 hr tablet Take 600 mg by mouth 2 (two) times daily.     insulin aspart (NOVOLOG FLEXPEN) 100 UNIT/ML FlexPen Inject 5 Units into the skin 3 (three) times daily with meals. 8 am, 12 pm, and 6 pm     Insulin Pen Needle 30G X 5 MM MISC 1 Device by Does not apply route daily. 3/16"  0   ipratropium-albuterol (DUONEB) 0.5-2.5 (3) MG/3ML SOLN Take 3 mLs by nebulization every 6 (six) hours as needed.     LANTUS SOLOSTAR 100 UNIT/ML Solostar Pen Inject 25 Units into the skin at bedtime.     levETIRAcetam (KEPPRA) 1000 MG tablet Take 1,000 mg by mouth daily. At 9 am, see other listings     levETIRAcetam (KEPPRA) 1000 MG tablet Take 1,000 mg by mouth at bedtime. At 9 pm with 500 mg tablet for a total of 1500 mg at bedtime     levETIRAcetam (KEPPRA) 500 MG tablet Take 500 mg by mouth at bedtime. Along with 1000 mg for a total on 1500 mg in the pm (see other listing)     lidocaine (LIDODERM) 5 % Place onto the skin 2 (two) times daily as needed. Apply to Right shoulder as needed once a morning and remove at bedtime     LORazepam (ATIVAN) 2 MG/ML injection Inject 0.5 mLs (1 mg total) into the vein every 15 (fifteen) minutes as needed. 4 mL 0   melatonin 3 MG TABS tablet Take 3 mg by mouth at bedtime.     Multiple Vitamins-Minerals (THEREMS M PO) (multivitamin with folic acid) tablet; 400 mcg; oral Once A Day;     nystatin powder Apply 1 Application topically 2 (two) times daily as needed.     omeprazole (PRILOSEC) 20 MG capsule Take 20 mg by mouth 2 (two) times daily before a meal.     Phenylephrine-Cocoa Butter 0.25-85.5 % SUPP Place rectally.     polyethylene glycol (MIRALAX / GLYCOLAX) 17 g packet Take 17 g by mouth daily.     rOPINIRole (REQUIP) 1 MG tablet Take 1 mg by mouth at bedtime.     rosuvastatin (CRESTOR) 20 MG tablet Take 20 mg by mouth daily.     sodium zirconium cyclosilicate (LOKELMA) 10 g PACK packet Take 10 g by mouth  once.     tiZANidine (ZANAFLEX) 2 MG tablet Take 2 mg by mouth every 6 (six) hours as needed for muscle spasms (for back and sciatic pain).     zinc oxide 20 % ointment Apply 1 Application topically 3 (three) times daily as needed for irritation (apply every shift).     AMBULATORY NON FORMULARY MEDICATION Medication Name:  Continue monthly catheter changes with 16fr catheter at skilled   nursing facility. 1 application MONTHLY   magnesium hydroxide (MILK OF MAGNESIA) 400 MG/5ML suspension Take 30 mLs by mouth daily as needed for mild constipation.     NON FORMULARY Diet - NAS, Cons CHO     No current facility-administered medications for this visit.    Past Medical History:  Diagnosis Date   Adult failure to thrive    Anemia    Anxiety    Atherosclerosis of aorta (HCC)    Central cord syndrome at C4 level of cervical spinal cord, subsequent encounter (HCC)    CKD (chronic kidney disease)    stage 3   Depression    DM type 2 with diabetic peripheral neuropathy (HCC)    Dysphagia    GERD (gastroesophageal reflux disease)    Gout    High cholesterol    HTN (hypertension)    Hyponatremia    MDD (major depressive disorder)    Neck pain    Neuropathy    Quadriplegia, C1-C4 incomplete (HCC)    Radiculopathy    RLS (restless legs syndrome)    Urinary retention     Past Surgical History:  Procedure Laterality Date   ABDOMINAL HYSTERECTOMY     ANTERIOR CERVICAL DECOMP/DISCECTOMY FUSION N/A 02/05/2021   Procedure: Cervical Three-Four  Anterior cervical decompression/discectomy/fusion;  Surgeon: Cabbell, Kyle, MD;  Location: [facility] OR;  Service: Neurosurgery;  Laterality: N/A;  RM 20   APPENDECTOMY     BACK SURGERY     BIOPSY  09/22/2021   Procedure: BIOPSY;  Surgeon: Carver, Charles K, DO;  Location: [facility]ITE;  Service: Endoscopy;;   CERVICAL DISC SURGERY     ESOPHAGOGASTRODUODENOSCOPY (EGD) WITH PROPOFOL N/A 09/22/2021   Procedure: ESOPHAGOGASTRODUODENOSCOPY (EGD) WITH PROPOFOL;   Surgeon: Carver, Charles K, DO;  Location: [facility];  Service: Endoscopy;  Laterality: N/A;  1:00pm   HAND SURGERY     KNEE SURGERY      Family History  Problem Relation Age of Onset   Cancer Mother    Cardiomyopathy Father    Seizures Sister        childhood   Seizures Brother        in his 30s   Colon cancer Neg Hx     Allergies as of 10/20/2022 - Review Complete 10/20/2022  Allergen Reaction Noted   Ace inhibitors Other (See Comments) 08/29/2021   Codeine  03/09/2018   Sulfa antibiotics  03/25/2021    Social History   Socioeconomic History   Marital status: Widowed    Spouse name: Not on file   Number of children: Not on file   Years of education: Not on file   Highest education level: Not on file  Occupational History   Not on file  Tobacco Use   Smoking status: Never   Smokeless tobacco: Never  Vaping Use   Vaping Use: Never used  Substance and Sexual Activity   Alcohol use: Never   Drug use: Never   Sexual activity: Not Currently  Other Topics Concern   Not on file  Social History Narrative   Lives at [facility]   Social Determinants of Health   Financial Resource Strain: Not on file  Food Insecurity: Not on file  Transportation Needs: Not on file  Physical Activity: Not on file  Stress: Not on file  Social Connections: Not on file     Review of Systems   Gen: Denies fever, chills, anorexia. Denies fatigue, weakness, weight loss.  CV: Denies chest pain,   palpitations, syncope, peripheral edema, and claudication. Resp: Denies dyspnea at rest, cough, wheezing, coughing up blood, and pleurisy. GI: See HPI Derm: Denies rash, itching, dry skin Psych: Denies depression, anxiety, memory loss, confusion. No homicidal or suicidal ideation.  Heme: Denies bruising, bleeding, and enlarged lymph nodes.   Physical Exam   BP 129/69 (BP Location: Left Arm, Patient Position: Sitting, Cuff Size: Small)   Pulse 63   Temp (!) 97.1 F (36.2 C)  (Temporal)   Ht 5' 2" (1.575 m)   BMI 32.89 kg/m   General:   Alert and oriented. No distress noted. Pleasant and cooperative.  Head:  Normocephalic and atraumatic. Eyes:  Conjuctiva clear without scleral icterus. Mouth:  Oral mucosa pink and moist. Good dentition. No lesions. Lungs:  Clear to auscultation bilaterally. No wheezes, rales, or rhonchi. No distress.  Heart:  S1, S2 present without murmurs appreciated.  Abdomen:  +BS, soft, mild distention. TTP to LLQ, LUQ, mid abdomen. No rebound or guarding. No HSM or masses noted. Rectal: unable to assess, in wheelchair and poor mobility. Patient in wheelchair Msk:  Symmetrical without gross deformities. Normal posture. Extremities:  Without edema. Neurologic:  Alert and  oriented x4 Psych:  Alert and cooperative. Normal mood and affect.   Assessment  [name] is a 86 y.o. female with a history of  anemia, diabetes type 2, HTN, HLD, central cord syndrome at C4 following a fall in May 2022, CKD presenting today with complaint of rectal pain and abdominal pain.   Anemia: Likely anemia of chronic disease. No prior colonoscopy on file and patient has previously declined colonoscopy. No reports of melena or brbpr. Patient denies weight loss but also has not been weighed in a while. Hgb stable. Appears to be within patients baseline which is 8-9.   Abdominal pain, early satiety, poor appetite: Patient having diffuse abdominal pain worse to the LLQ. Symptoms fairly consistent with uncontrolled reflux and constipation (see below). Nothing to explain weight loss noted on recent CT A/P. Early satiety and poor appetite is likely also likely due to change over time since her injury . LLQ pain likely in the setting of constipation and possible fecal impaction.   Constipation: Patient reports incontinence given cord injury and is not sure how often she goes. Reports occasional rectal pain along with lower abdominal pain. Patient somewhat difficult  historian. Only taking metamucil and miralax. Recent CT with evidence of fecal impaction. Advised to add linzess to regimen of miralax and metamucil. Given cord injury recommended suppository at least once weekly.   GERD, dysphagia: Having upper abdominal pain and some burning and mild dysphagia. Having to crush most meds or take with applesauce. Advised to increase PPI to BID. Discussed GERD diet. Patient requesting repeat EGD. Last EGD and recommendations reviewed with patient and is insistent about  repeating EGD. Will schedule per patients request.  I have discussed the risks, alternatives, benefits with regards to but not limited to the risk of reaction to medication, bleeding, infection, perforation and the patient is agreeable to proceed. Written consent to be obtained.    PLAN   Continue miralax once daily Add Linzess 72 mcg daily.  Suppository once weekly.  EGD with possible dilation: ASA 3 per patient request(call daughter to schedule) Hold farxiga for 3 days 1/2 normal dose of lantus night prior Increase omeprazole to 40 mg twice daily from 20 mg twice daily.  GERD diet Follow up in 2 months.     Marshell Dilauro, MSN, FNP-BC,   AGACNP-BC [facility] 

## 2022-10-21 ENCOUNTER — Telehealth (INDEPENDENT_AMBULATORY_CARE_PROVIDER_SITE_OTHER): Payer: Self-pay | Admitting: *Deleted

## 2022-10-21 NOTE — Telephone Encounter (Signed)
LMOVM to call back for daughter Peter Congo to schedule EGD with Dr. Abbey Chatters ASA 3/4, hold farxiga 3 days prior

## 2022-10-23 NOTE — Telephone Encounter (Signed)
EGD with possible dilation: ASA 3 per patient request(call daughter to schedule) Hold farxiga for 3 days 1/2 normal dose of lantus night prior

## 2022-10-26 ENCOUNTER — Encounter: Payer: Self-pay | Admitting: Internal Medicine

## 2022-10-26 ENCOUNTER — Non-Acute Institutional Stay (SKILLED_NURSING_FACILITY): Payer: Medicare HMO | Admitting: Internal Medicine

## 2022-10-26 DIAGNOSIS — Z862 Personal history of diseases of the blood and blood-forming organs and certain disorders involving the immune mechanism: Secondary | ICD-10-CM | POA: Diagnosis not present

## 2022-10-26 DIAGNOSIS — N1832 Chronic kidney disease, stage 3b: Secondary | ICD-10-CM | POA: Diagnosis not present

## 2022-10-26 DIAGNOSIS — N189 Chronic kidney disease, unspecified: Secondary | ICD-10-CM | POA: Diagnosis not present

## 2022-10-26 DIAGNOSIS — R1030 Lower abdominal pain, unspecified: Secondary | ICD-10-CM

## 2022-10-26 DIAGNOSIS — Z794 Long term (current) use of insulin: Secondary | ICD-10-CM | POA: Diagnosis not present

## 2022-10-26 DIAGNOSIS — R29818 Other symptoms and signs involving the nervous system: Secondary | ICD-10-CM | POA: Diagnosis not present

## 2022-10-26 DIAGNOSIS — R4189 Other symptoms and signs involving cognitive functions and awareness: Secondary | ICD-10-CM

## 2022-10-26 DIAGNOSIS — E1122 Type 2 diabetes mellitus with diabetic chronic kidney disease: Secondary | ICD-10-CM

## 2022-10-26 DIAGNOSIS — E43 Unspecified severe protein-calorie malnutrition: Secondary | ICD-10-CM | POA: Diagnosis not present

## 2022-10-26 NOTE — Progress Notes (Unsigned)
   NURSING HOME LOCATION:  [facility] ROOM NUMBER:  [id number]  CODE STATUS:  DNR  PCP:  [name] NP  This is a nursing facility follow up visit of chronic medical diagnoses & to document compliance with Regulation 483.30 (c) in [employer] [place] Manual Phase 2 which mandates caregiver visit ( visits can alternate among physician, PA or NP as per statutes) within 10 days of 30 days / 60 days/ 90 days post admission to SNF date    Interim medical record and care since last SNF visit was updated with review of diagnostic studies and change in clinical status since last visit were documented.  HPI:  Review of systems: Dementia invalidated responses. Date given as   Constitutional: No fever, significant weight change, fatigue  Eyes: No redness, discharge, pain, vision change ENT/mouth: No nasal congestion,  purulent discharge, earache, change in hearing, sore throat  Cardiovascular: No chest pain, palpitations, paroxysmal nocturnal dyspnea, claudication, edema  Respiratory: No cough, sputum production, hemoptysis, DOE, significant snoring, apnea   Gastrointestinal: No heartburn, dysphagia, abdominal pain, nausea /vomiting, rectal bleeding, melena, change in bowels Genitourinary: No dysuria, hematuria, pyuria, incontinence, nocturia Musculoskeletal: No joint stiffness, joint swelling, weakness, pain Dermatologic: No rash, pruritus, change in appearance of skin Neurologic: No dizziness, headache, syncope, seizures, numbness, tingling Psychiatric: No significant anxiety, depression, insomnia, anorexia Endocrine: No change in hair/skin/nails, excessive thirst, excessive hunger, excessive urination  Hematologic/lymphatic: No significant bruising, lymphadenopathy, abnormal bleeding Allergy/immunology: No itchy/watery eyes, significant sneezing, urticaria, angioedema  Physical exam:  Pertinent or positive findings: General appearance: Adequately nourished; no acute  distress, increased work of breathing is present.   Lymphatic: No lymphadenopathy about the head, neck, axilla. Eyes: No conjunctival inflammation or lid edema is present. There is no scleral icterus. Ears:  External ear exam shows no significant lesions or deformities.   Nose:  External nasal examination shows no deformity or inflammation. Nasal mucosa are pink and moist without lesions, exudates Oral exam:  Lips and gums are healthy appearing. There is no oropharyngeal erythema or exudate. Neck:  No thyromegaly, masses, tenderness noted.    Heart:  Normal rate and regular rhythm. S1 and S2 normal without gallop, murmur, click, rub .  Lungs: Chest clear to auscultation without wheezes, rhonchi, rales, rubs. Abdomen: Bowel sounds are normal. Abdomen is soft and nontender with no organomegaly, hernias, masses. GU: Deferred  Extremities:  No cyanosis, clubbing, edema  Neurologic exam : Cn 2-7 intact Strength equal  in upper & lower extremities Balance, Rhomberg, finger to nose testing could not be completed due to clinical state Deep tendon reflexes are equal Skin: Warm & dry w/o tenting. No significant lesions or rash.  See summary under each active problem in the Problem List with associated updated therapeutic plan

## 2022-10-26 NOTE — Assessment & Plan Note (Signed)
Serially H/H improved ; current H/H 9.6/29.9 , up from 8.5/26.9. No bleeding dyscrasias reported @ SNF.

## 2022-10-26 NOTE — Assessment & Plan Note (Signed)
See 10/26/2022 can not describe GI recommendations nor give gender of that specialist, stating " my memory not too good." Today she denied dysphagia or dyspepsia which was reason EGD recommended. MMSE indicated.

## 2022-10-26 NOTE — Assessment & Plan Note (Addendum)
A1c 8.8% , up from 6.5%. Adjust insulins as per UpToDate guidelines. Creat 1.63/GFR 31up from 22 in 11/23. Discuss Gabapentin dose adjustment with NP.

## 2022-10-26 NOTE — Assessment & Plan Note (Signed)
Albumin 3.5 & Total protein 7.2, both improved but significant limb atrophy & interosseous wasting present. Anorexia described; Nutritionist to assess.

## 2022-10-26 NOTE — Patient Instructions (Signed)
See assessment and plan under each diagnosis in the problem list and acutely for this visit 

## 2022-10-27 ENCOUNTER — Encounter: Payer: Self-pay | Admitting: *Deleted

## 2022-10-27 ENCOUNTER — Encounter: Payer: Self-pay | Admitting: Internal Medicine

## 2022-10-27 NOTE — Telephone Encounter (Signed)
Pt has been scheduled for 11/19/22. Instructions sent to pt via MyChart.

## 2022-10-27 NOTE — Telephone Encounter (Signed)
Faxed instructions to [facility], attn: Jocelyn Lamer

## 2022-10-27 NOTE — Assessment & Plan Note (Signed)
Most recent A1c was 8.8% on 08/20/2022.  There has been progressive increase in the A1c from a value of 6.5% in February 2023.  Med adjustment will be discussed with NP.

## 2022-10-27 NOTE — Telephone Encounter (Signed)
[employer] PA: Approved Authorization [id number]  Tracking [id number] DOS: 11/19/22-01/29/23

## 2022-10-27 NOTE — Assessment & Plan Note (Signed)
10/19/2022 creatinine 1.63 and GFR 31 indicating low stage IIIb CKD.  In November 2023 GFR was as low as 22. Med list reviewed; no indication for med change or dosage change at this time.  Continue to monitor.

## 2022-10-29 ENCOUNTER — Encounter: Payer: Self-pay | Admitting: Radiology

## 2022-10-31 ENCOUNTER — Emergency Department (HOSPITAL_COMMUNITY)
Admission: EM | Admit: 2022-10-31 | Discharge: 2022-10-31 | Disposition: A | Discharge status: Skilled Nursing Facility | Payer: Medicare HMO | Attending: Emergency Medicine | Admitting: Emergency Medicine

## 2022-10-31 ENCOUNTER — Emergency Department (HOSPITAL_COMMUNITY): Payer: Medicare HMO

## 2022-10-31 ENCOUNTER — Other Ambulatory Visit: Payer: Self-pay

## 2022-10-31 ENCOUNTER — Encounter (HOSPITAL_COMMUNITY): Payer: Self-pay

## 2022-10-31 DIAGNOSIS — R1084 Generalized abdominal pain: Secondary | ICD-10-CM | POA: Diagnosis not present

## 2022-10-31 DIAGNOSIS — Z794 Long term (current) use of insulin: Secondary | ICD-10-CM | POA: Insufficient documentation

## 2022-10-31 DIAGNOSIS — Z79899 Other long term (current) drug therapy: Secondary | ICD-10-CM | POA: Insufficient documentation

## 2022-10-31 DIAGNOSIS — N189 Chronic kidney disease, unspecified: Secondary | ICD-10-CM | POA: Diagnosis not present

## 2022-10-31 DIAGNOSIS — E1122 Type 2 diabetes mellitus with diabetic chronic kidney disease: Secondary | ICD-10-CM | POA: Insufficient documentation

## 2022-10-31 DIAGNOSIS — Z7982 Long term (current) use of aspirin: Secondary | ICD-10-CM | POA: Insufficient documentation

## 2022-10-31 DIAGNOSIS — I700 Atherosclerosis of aorta: Secondary | ICD-10-CM | POA: Diagnosis not present

## 2022-10-31 DIAGNOSIS — R1032 Left lower quadrant pain: Secondary | ICD-10-CM | POA: Insufficient documentation

## 2022-10-31 DIAGNOSIS — G8929 Other chronic pain: Secondary | ICD-10-CM

## 2022-10-31 DIAGNOSIS — Z7984 Long term (current) use of oral hypoglycemic drugs: Secondary | ICD-10-CM | POA: Insufficient documentation

## 2022-10-31 DIAGNOSIS — N3000 Acute cystitis without hematuria: Secondary | ICD-10-CM | POA: Diagnosis not present

## 2022-10-31 DIAGNOSIS — I129 Hypertensive chronic kidney disease with stage 1 through stage 4 chronic kidney disease, or unspecified chronic kidney disease: Secondary | ICD-10-CM | POA: Insufficient documentation

## 2022-10-31 DIAGNOSIS — R109 Unspecified abdominal pain: Secondary | ICD-10-CM | POA: Diagnosis present

## 2022-10-31 LAB — COMPREHENSIVE METABOLIC PANEL
ALT: 12 U/L (ref 0–44)
AST: 16 U/L (ref 15–41)
Albumin: 3.2 g/dL — ABNORMAL LOW (ref 3.5–5.0)
Alkaline Phosphatase: 53 U/L (ref 38–126)
Anion gap: 8 (ref 5–15)
BUN: 46 mg/dL — ABNORMAL HIGH (ref 8–23)
CO2: 16 mmol/L — ABNORMAL LOW (ref 22–32)
Calcium: 8.6 mg/dL — ABNORMAL LOW (ref 8.9–10.3)
Chloride: 110 mmol/L (ref 98–111)
Creatinine, Ser: 1.64 mg/dL — ABNORMAL HIGH (ref 0.44–1.00)
GFR, Estimated: 31 mL/min — ABNORMAL LOW (ref >60)
Glucose, Bld: 93 mg/dL (ref 70–99)
Potassium: 5.2 mmol/L — ABNORMAL HIGH (ref 3.5–5.1)
Sodium: 134 mmol/L — ABNORMAL LOW (ref 135–145)
Total Bilirubin: 0.2 mg/dL — ABNORMAL LOW (ref 0.3–1.2)
Total Protein: 6.5 g/dL (ref 6.5–8.1)

## 2022-10-31 LAB — URINALYSIS, ROUTINE W REFLEX MICROSCOPIC
Bacteria, UA: NONE SEEN
Bilirubin Urine: NEGATIVE
Glucose, UA: 50 mg/dL — AB
Ketones, ur: NEGATIVE mg/dL
Nitrite: NEGATIVE
Protein, ur: NEGATIVE mg/dL
Specific Gravity, Urine: 1.012 (ref 1.005–1.030)
pH: 5 (ref 5.0–8.0)

## 2022-10-31 LAB — CBC
HCT: 26.5 % — ABNORMAL LOW (ref 36.0–46.0)
Hemoglobin: 8.4 g/dL — ABNORMAL LOW (ref 12.0–15.0)
MCH: 29.8 pg (ref 26.0–34.0)
MCHC: 31.7 g/dL (ref 30.0–36.0)
MCV: 94 fL (ref 80.0–100.0)
Platelets: 183 10*3/uL (ref 150–400)
RBC: 2.82 MIL/uL — ABNORMAL LOW (ref 3.87–5.11)
RDW: 13.4 % (ref 11.5–15.5)
WBC: 4.6 10*3/uL (ref 4.0–10.5)
nRBC: 0 % (ref 0.0–0.2)

## 2022-10-31 LAB — LIPASE, BLOOD: Lipase: 41 U/L (ref 11–51)

## 2022-10-31 MED ORDER — SODIUM CHLORIDE 0.9 % IV SOLN: INTRAVENOUS | Status: DC

## 2022-10-31 MED ORDER — SODIUM CHLORIDE 0.9 % IV BOLUS
500.0000 mL | Freq: Once | INTRAVENOUS | Status: AC
  Administered 2022-10-31: 500 mL via INTRAVENOUS

## 2022-10-31 MED ORDER — IOHEXOL 300 MG/ML  SOLN
80.0000 mL | Freq: Once | INTRAMUSCULAR | Status: AC | PRN
  Administered 2022-10-31: 80 mL via INTRAVENOUS

## 2022-10-31 MED ORDER — CEFTRIAXONE SODIUM 1 G IJ SOLR
1.0000 g | Freq: Once | INTRAMUSCULAR | Status: AC
  Administered 2022-10-31: 1 g via INTRAMUSCULAR
  Filled 2022-10-31: qty 10

## 2022-10-31 MED ORDER — CEPHALEXIN 500 MG PO CAPS: 500.0000 mg | ORAL_CAPSULE | Freq: Four times a day (QID) | ORAL | 0 refills | Status: DC

## 2022-10-31 MED ORDER — HYDROMORPHONE HCL 1 MG/ML IJ SOLN
1.0000 mg | Freq: Once | INTRAMUSCULAR | Status: AC
  Administered 2022-10-31: 1 mg via INTRAVENOUS
  Filled 2022-10-31: qty 1

## 2022-10-31 MED ORDER — LIDOCAINE HCL (PF) 1 % IJ SOLN
INTRAMUSCULAR | Status: AC
  Administered 2022-10-31: 2.1 mL
  Filled 2022-10-31: qty 5

## 2022-10-31 MED ORDER — SODIUM CHLORIDE 0.9 % IV SOLN
1.0000 g | Freq: Once | INTRAVENOUS | Status: DC
  Filled 2022-10-31: qty 10

## 2022-10-31 NOTE — Discharge Instructions (Signed)
Take the Keflex as directed for the urinary tract infection.  Urine culture sent.  Nursing facility will need to follow-up on the urine culture to make sure the antibiotics appropriate and also to verify that there truly is a urinary tract infection.

## 2022-10-31 NOTE — ED Triage Notes (Signed)
Pt with LUQ pain for over 1 week.  Pt denies any N/V/D or constipation.  Family came to visit and requested pt get checked out.

## 2022-10-31 NOTE — ED Notes (Signed)
Patient transported to CT 

## 2022-10-31 NOTE — ED Provider Notes (Addendum)
[facility] Provider Note   CSN: [id number] Arrival date & time: 10/31/22  1109     History  Chief Complaint  Patient presents with   Abdominal Pain    [name] is a 87 y.o. female.  Patient presenting with a complaint of left flank pain for approximately 2 months.  No nausea vomiting or diarrhea.  No fevers.  Family came to visit and was concerned about the abdominal pain they recommended that she come in.  Past medical history significant for type 2 diabetes hypertension urinary retention patient has a Foley catheter in place.  Depression quadriplegia C1 C4 incomplete.  Chronic kidney disease hyponatremia dysphagia Central cord syndrome at C4 and adult failure to thrive.  Past medical history significant for abdominal hysterectomy and appendectomy.  Patient is never used tobacco products.  Patient last seen in the emergency department on February 5 for abdominal pain and was diagnosed with constipation at that time.  Patient also has a history of seizures and is on Keppra for that       Home Medications Prior to Admission medications   Medication Sig Start Date End Date Taking? Authorizing Provider  acetaminophen (TYLENOL) 325 MG tablet Take 650 mg by mouth every 8 (eight) hours.    [provider]  AMBULATORY NON FORMULARY MEDICATION Medication Name:  Continue monthly catheter changes with 39f catheter at skilled nursing facility. 08/12/21   McKenzie, PCandee Furbish MD  Artificial Saliva (BIOTENE MOISTURIZING MOUTH MT) Use as directed 1 application. in the mouth or throat at bedtime. 9 pm    [provider]  aspirin EC 81 MG tablet Take 81 mg by mouth daily. Swallow whole.9 am    [provider]  betamethasone dipropionate (DIPROLENE) 0.05 % ointment Apply 1 application  topically 2 (two) times daily as needed.    [provider]  calcium carbonate (TUMS EX) 750 MG chewable tablet Chew 1 tablet by  mouth 3 (three) times daily.    [provider]  camphor-menthol (Timoteo Ace lotion Apply 1 Application topically 2 (two) times daily as needed for itching.    [provider]  dapagliflozin propanediol (FARXIGA) 5 MG TABS tablet Take 5 mg by mouth daily.    [provider]  denosumab (PROLIA) 60 MG/ML SOSY injection Inject 60 mg into the skin every 6 (six) months.    [provider]  DULoxetine (CYMBALTA) 20 MG capsule Take 20 mg by mouth daily. 9 am    [provider]  fexofenadine (ALLEGRA) 180 MG tablet Take 180 mg by mouth daily.    [provider]  fluconazole (DIFLUCAN) 200 MG tablet Take 200 mg by mouth at bedtime.    [provider]  fosfomycin (MONUROL) 3 g PACK Take 3 g by mouth. Q 72 hours    [provider]  gabapentin (NEURONTIN) 300 MG capsule Take 300 mg by mouth 3 (three) times daily.    [provider]  guaiFENesin (MUCINEX) 600 MG 12 hr tablet Take 600 mg by mouth 2 (two) times daily.    [provider]  insulin aspart (NOVOLOG FLEXPEN) 100 UNIT/ML FlexPen Inject 5 Units into the skin 3 (three) times daily with meals. 8 am, 12 pm, and 6 pm    [provider]  Insulin Pen Needle 30G X 5 MM MISC 1 Device by Does not apply route daily. 3/16"    GGerlene Fee NP  ipratropium-albuterol (DUONEB) 0.5-2.5 (3) MG/3ML  SOLN Take 3 mLs by nebulization every 6 (six) hours as needed.    [provider]  LANTUS SOLOSTAR 100 UNIT/ML Solostar Pen Inject 25 Units into the skin at bedtime. 09/04/21   [provider]  levETIRAcetam (KEPPRA) 1000 MG tablet Take 1,000 mg by mouth daily. At 9 am, see other listings    [provider]  levETIRAcetam (KEPPRA) 1000 MG tablet Take 1,000 mg by mouth at bedtime. At 9 pm with 500 mg tablet for a total of 1500 mg at bedtime    [provider]  levETIRAcetam (KEPPRA) 500 MG tablet Take 500 mg by mouth at bedtime. Along with 1000 mg  for a total on 1500 mg in the pm (see other listing)    [provider]  lidocaine (LIDODERM) 5 % Place onto the skin 2 (two) times daily as needed. Apply to Right shoulder as needed once a morning and remove at bedtime    [provider]  linaclotide (LINZESS) 72 MCG capsule Take 1 capsule (72 mcg total) by mouth daily before breakfast. 10/20/22   Sherron Monday, NP  LORazepam (ATIVAN) 2 MG/ML injection Inject 0.5 mLs (1 mg total) into the vein every 15 (fifteen) minutes as needed. 10/03/21   Gerlene Fee, NP  magnesium hydroxide (MILK OF MAGNESIA) 400 MG/5ML suspension Take 30 mLs by mouth daily as needed for mild constipation.    [provider]  melatonin 3 MG TABS tablet Take 3 mg by mouth at bedtime.    [provider]  Multiple Vitamins-Minerals (THEREMS M PO) (multivitamin with folic acid) tablet; A999333 mcg; oral Once A Day;    [provider]  NON FORMULARY Diet - NAS, Cons CHO    [provider]  nystatin powder Apply 1 Application topically 2 (two) times daily as needed.    [provider]  omeprazole (PRILOSEC) 40 MG capsule Take 1 capsule (40 mg total) by mouth in the morning and at bedtime. 10/20/22   Sherron Monday, NP  Phenylephrine-Cocoa Butter 0.25-85.5 % SUPP Place rectally.    [provider]  polyethylene glycol (MIRALAX / GLYCOLAX) 17 g packet Take 17 g by mouth daily.    [provider]  rOPINIRole (REQUIP) 1 MG tablet Take 1 mg by mouth at bedtime.    [provider]  rosuvastatin (CRESTOR) 20 MG tablet Take 20 mg by mouth daily.    [provider]  sodium zirconium cyclosilicate (LOKELMA) 10 g PACK packet Take 10 g by mouth once.    [provider]  tiZANidine (ZANAFLEX) 2 MG tablet Take 2 mg by mouth every 6 (six) hours as needed for muscle spasms (for back and sciatic pain).    [provider]  zinc oxide 20 % ointment Apply 1 Application topically 3  (three) times daily as needed for irritation (apply every shift).    [provider]      Allergies    Ace inhibitors, Codeine, and Sulfa antibiotics    Review of Systems   Review of Systems  Constitutional:  Negative for chills and fever.  HENT:  Negative for ear pain and sore throat.   Eyes:  Negative for pain and visual disturbance.  Respiratory:  Negative for cough and shortness of breath.   Cardiovascular:  Negative for chest pain and palpitations.  Gastrointestinal:  Positive for abdominal pain. Negative for vomiting.  Genitourinary:  Positive for flank pain. Negative for dysuria and hematuria.  Musculoskeletal:  Negative for  arthralgias and back pain.  Skin:  Negative for color change and rash.  Neurological:  Negative for seizures and syncope.  All other systems reviewed and are negative.   Physical Exam Updated Vital Signs BP (!) 149/54   Pulse 63   Temp 98.5 F (36.9 C) (Oral)   Resp 16   Ht 1.575 m ('5\' 2"'$ )   Wt 81.6 kg   SpO2 95%   BMI 32.89 kg/m  Physical Exam Vitals and nursing note reviewed.  Constitutional:      General: She is not in acute distress.    Appearance: Normal appearance. She is well-developed.  HENT:     Head: Normocephalic and atraumatic.     Mouth/Throat:     Mouth: Mucous membranes are moist.  Eyes:     Conjunctiva/sclera: Conjunctivae normal.  Cardiovascular:     Rate and Rhythm: Normal rate and regular rhythm.     Heart sounds: No murmur heard. Pulmonary:     Effort: Pulmonary effort is normal. No respiratory distress.     Breath sounds: Normal breath sounds.  Abdominal:     General: There is no distension.     Palpations: Abdomen is soft.     Tenderness: There is no abdominal tenderness. There is no guarding.  Musculoskeletal:        General: No swelling.     Cervical back: Normal range of motion and neck supple.  Skin:    General: Skin is warm and dry.     Capillary Refill: Capillary refill takes less than 2  seconds.  Neurological:     Mental Status: She is alert and oriented to person, place, and time. Mental status is at baseline.  Psychiatric:        Mood and Affect: Mood normal.     ED Results / Procedures / Treatments   Labs (all labs ordered are listed, but only abnormal results are displayed) Labs Reviewed  COMPREHENSIVE METABOLIC PANEL - Abnormal; Notable for the following components:      Result Value   Sodium 134 (*)    Potassium 5.2 (*)    CO2 16 (*)    BUN 46 (*)    Creatinine, Ser 1.64 (*)    Calcium 8.6 (*)    Albumin 3.2 (*)    Total Bilirubin 0.2 (*)    GFR, Estimated 31 (*)    All other components within normal limits  CBC - Abnormal; Notable for the following components:   RBC 2.82 (*)    Hemoglobin 8.4 (*)    HCT 26.5 (*)    All other components within normal limits  LIPASE, BLOOD  URINALYSIS, ROUTINE W REFLEX MICROSCOPIC    EKG None  Radiology CT Abdomen Pelvis W Contrast  Result Date: 10/31/2022 CLINICAL DATA:  Left lower quadrant abdominal pain EXAM: CT ABDOMEN AND PELVIS WITH CONTRAST TECHNIQUE: Multidetector CT imaging of the abdomen and pelvis was performed using the standard protocol following bolus administration of intravenous contrast. RADIATION DOSE REDUCTION: This exam was performed according to the departmental dose-optimization program which includes automated exposure control, adjustment of the mA and/or kV according to patient size and/or use of iterative reconstruction technique. CONTRAST:  49m OMNIPAQUE IOHEXOL 300 MG/ML  SOLN COMPARISON:  10/01/2022 FINDINGS: Lower chest: No acute abnormality. Left coronary artery calcifications. Hepatobiliary: No solid liver abnormality is seen. No gallstones, gallbladder wall thickening, or biliary dilatation. Pancreas: Unremarkable. No pancreatic ductal dilatation or surrounding inflammatory changes. Spleen: Normal in size without significant abnormality. Adrenals/Urinary Tract:  Adrenal glands are  unremarkable. Kidneys are normal, without renal calculi, solid lesion, or hydronephrosis. Bladder is decompressed by Foley catheter. Similar wall thickening and fat stranding about the bladder (series 6, image 85). Stomach/Bowel: Stomach is within normal limits. Appendix appears normal. No evidence of bowel wall thickening, distention, or inflammatory changes. Descending and sigmoid diverticulosis. Moderate burden of stool throughout the colon and rectum. Vascular/Lymphatic: Aortic atherosclerosis. No enlarged abdominal or pelvic lymph nodes. Reproductive: Status post hysterectomy. Other: No abdominal wall hernia or abnormality. No ascites. Musculoskeletal: No acute or significant osseous findings. IMPRESSION: 1. Descending and sigmoid diverticulosis without evidence of acute diverticulitis. 2. Bladder is decompressed by Foley catheter. Similar wall thickening and fat stranding about the bladder, suggestive of cystitis. Correlate with urinalysis. 3. Coronary artery disease. Aortic Atherosclerosis (ICD10-I70.0). Electronically Signed   By: Delanna Ahmadi M.D.   On: 10/31/2022 13:09    Procedures Procedures    Medications Ordered in ED Medications  0.9 %  sodium chloride infusion ( Intravenous New Bag/Given 10/31/22 1321)  sodium chloride 0.9 % bolus 500 mL (0 mLs Intravenous Stopped 10/31/22 1318)  iohexol (OMNIPAQUE) 300 MG/ML solution 80 mL (80 mLs Intravenous Contrast Given 10/31/22 1245)    ED Course/ Medical Decision Making/ A&P                             Medical Decision Making Amount and/or Complexity of Data Reviewed Labs: ordered. Radiology: ordered.  Risk Prescription drug management.   Patient's workup for the abdominal pain liver function test without any acute abnormalities renal function GFR 31's baseline for her.  Potassium up a little bit at 5.2 at times has been higher.  Sodium normal.  Anion gap normal.  CBC no leukocytosis hemoglobin 8.4.  CT scan of the abdomen and pelvis has  evidence of descending and sigmoid diverticulosis but no evidence of diverticulitis.  Bladder decompressed by Foley catheters similar wall thickening and fat stranding about the bladder suggestive of cystitis correlate with urinalysis.  Urinalysis does have 21-50 whites.  No red blood cells.  No bacteria.  But back in February when patient had 21-50 whites also did have RBCs it was culture positive for urinary tract infection.  Will give patient a dose of Rocephin here and have her continue with flex at the nursing facility.  Until culture results back.  Otherwise no acute findings on the CT scan of the abdomen.  Final Clinical Impression(s) / ED Diagnoses Final diagnoses:  Left lower quadrant abdominal pain    Rx / DC Orders ED Discharge Orders     None         Fredia Sorrow, MD 10/31/22 1333    Fredia Sorrow, MD 10/31/22 1517

## 2022-11-02 ENCOUNTER — Non-Acute Institutional Stay (SKILLED_NURSING_FACILITY): Payer: Medicare HMO | Admitting: Adult Health

## 2022-11-02 ENCOUNTER — Encounter: Payer: Self-pay | Admitting: Adult Health

## 2022-11-02 DIAGNOSIS — E1140 Type 2 diabetes mellitus with diabetic neuropathy, unspecified: Secondary | ICD-10-CM | POA: Diagnosis not present

## 2022-11-02 DIAGNOSIS — Z794 Long term (current) use of insulin: Secondary | ICD-10-CM | POA: Diagnosis not present

## 2022-11-02 DIAGNOSIS — B351 Tinea unguium: Secondary | ICD-10-CM | POA: Diagnosis not present

## 2022-11-02 DIAGNOSIS — R1084 Generalized abdominal pain: Secondary | ICD-10-CM

## 2022-11-02 DIAGNOSIS — E875 Hyperkalemia: Secondary | ICD-10-CM | POA: Diagnosis not present

## 2022-11-02 NOTE — Progress Notes (Unsigned)
Location:  [facility] Room Number: 154 Place of Service:  SNF (31)   CODE STATUS: dnr   Allergies  Allergen Reactions   Ace Inhibitors Other (See Comments)    Hyperkalemia    Codeine    Sulfa Antibiotics     Chief Complaint  Patient presents with   Acute Visit    Follow up ED visit     HPI:  She went to the ED over this past weekend for abdominal pain. She is unable to describe the type of pain that she has; she does deny cramping; sharp; dull pain. She does not know if she having constipation or diarrhea. She had a urinalysis performed in the ED which did indicate a possible uti. She was placed on keflex. She tells me that her foley was changed in the ED to collect her specimen; but I cannot find any documentation for this. She denies any nausea or vomiting. There are no reports of fevers present. Her k+ level is slightly elevated at 5.2.  I have consulted pharmacy to review her medications for k+ as a side effect.   Past Medical History:  Diagnosis Date   Adult failure to thrive    Anemia    Anxiety    Atherosclerosis of aorta (HCC)    Central cord syndrome at C4 level of cervical spinal cord, subsequent encounter ([place])    CKD (chronic kidney disease)    stage 3   Depression    DM type 2 with diabetic peripheral neuropathy (HCC)    Dysphagia    GERD (gastroesophageal reflux disease)    Gout    High cholesterol    HTN (hypertension)    Hyponatremia    MDD (major depressive disorder)    Neck pain    Neuropathy    Quadriplegia, C1-C4 incomplete (HCC)    Radiculopathy    RLS (restless legs syndrome)    Urinary retention     Past Surgical History:  Procedure Laterality Date   ABDOMINAL HYSTERECTOMY     ANTERIOR CERVICAL DECOMP/DISCECTOMY FUSION N/A 02/05/2021   Procedure: Cervical Three-Four  Anterior cervical decompression/discectomy/fusion;  Surgeon: Ashok Pall, MD;  Location: [facility];  Service: Neurosurgery;  Laterality: N/A;  RM 20    APPENDECTOMY     BACK SURGERY     BIOPSY  09/22/2021   Procedure: BIOPSY;  Surgeon: [name], DO;  Location: [facility];  Service: Endoscopy;;   CERVICAL DISC SURGERY     ESOPHAGOGASTRODUODENOSCOPY (EGD) WITH PROPOFOL N/A 09/22/2021   Procedure: ESOPHAGOGASTRODUODENOSCOPY (EGD) WITH PROPOFOL;  Surgeon: [name], DO;  Location: [facility];  Service: Endoscopy;  Laterality: N/A;  1:00pm   HAND SURGERY     KNEE SURGERY      Social History   Socioeconomic History   Marital status: Widowed    Spouse name: Not on file   Number of children: Not on file   Years of education: Not on file   Highest education level: Not on file  Occupational History   Not on file  Tobacco Use   Smoking status: Never   Smokeless tobacco: Never  Vaping Use   Vaping Use: Never used  Substance and Sexual Activity   Alcohol use: Never   Drug use: Never   Sexual activity: Not Currently  Other Topics Concern   Not on file  Social History Narrative   Lives at [facility]  Strain: Not on file  Food Insecurity: Not on file  Transportation Needs: Not on file  Physical Activity: Not on file  Stress: Not on file  Social Connections: Not on file  Intimate Partner Violence: Not on file   Family History  Problem Relation Age of Onset   Cancer Mother    Cardiomyopathy Father    Seizures Sister        childhood   Seizures Brother        in his 63s   Colon cancer Neg Hx       VITAL SIGNS BP 138/72   Pulse 80   Temp (!) 97.2 F (36.2 C)   Resp 20   Ht '5\' 2"'$  (1.575 m)   Wt 173 lb 14.4 oz (78.9 kg)   SpO2 97%   BMI 31.81 kg/m   Outpatient Encounter Medications as of 11/02/2022  Medication Sig Note   acetaminophen (TYLENOL) 325 MG tablet Take 650 mg by mouth every 8 (eight) hours.    AMBULATORY NON FORMULARY MEDICATION Medication Name:  Continue monthly catheter changes with 63f catheter at skilled nursing  facility.    Artificial Saliva (BIOTENE MOISTURIZING MOUTH MT) Use as directed 1 application. in the mouth or throat at bedtime. 9 pm    aspirin EC 81 MG tablet Take 81 mg by mouth daily. Swallow whole.9 am    betamethasone dipropionate (DIPROLENE) 0.05 % ointment Apply 1 application  topically 2 (two) times daily as needed.    calcium carbonate (TUMS EX) 750 MG chewable tablet Chew 1 tablet by mouth 3 (three) times daily.    camphor-menthol (SARNA) lotion Apply 1 Application topically 2 (two) times daily as needed for itching.    cephALEXin (KEFLEX) 500 MG capsule Take 1 capsule (500 mg total) by mouth 4 (four) times daily.    dapagliflozin propanediol (FARXIGA) 5 MG TABS tablet Take 5 mg by mouth daily.    denosumab (PROLIA) 60 MG/ML SOSY injection Inject 60 mg into the skin every 6 (six) months.    DULoxetine (CYMBALTA) 20 MG capsule Take 20 mg by mouth daily. 9 am    fexofenadine (ALLEGRA) 180 MG tablet Take 180 mg by mouth daily.    fluconazole (DIFLUCAN) 200 MG tablet Take 200 mg by mouth at bedtime.    fosfomycin (MONUROL) 3 g PACK Take 3 g by mouth. Q 72 hours    gabapentin (NEURONTIN) 300 MG capsule Take 300 mg by mouth 3 (three) times daily.    guaiFENesin (MUCINEX) 600 MG 12 hr tablet Take 600 mg by mouth 2 (two) times daily.    insulin aspart (NOVOLOG FLEXPEN) 100 UNIT/ML FlexPen Inject 5 Units into the skin 3 (three) times daily with meals. 8 am, 12 pm, and 6 pm    Insulin Pen Needle 30G X 5 MM MISC 1 Device by Does not apply route daily. 3/16"    ipratropium-albuterol (DUONEB) 0.5-2.5 (3) MG/3ML SOLN Take 3 mLs by nebulization every 6 (six) hours as needed.    LANTUS SOLOSTAR 100 UNIT/ML Solostar Pen Inject 25 Units into the skin at bedtime.    levETIRAcetam (KEPPRA) 1000 MG tablet Take 1,000 mg by mouth daily. At 9 am, see other listings    levETIRAcetam (KEPPRA) 1000 MG tablet Take 1,000 mg by mouth at bedtime. At 9 pm with 500 mg tablet for a total of 1500 mg at bedtime     levETIRAcetam (KEPPRA) 500 MG tablet Take 500 mg by mouth at bedtime. Along with 1000 mg for  a total on 1500 mg in the pm (see other listing)    lidocaine (LIDODERM) 5 % Place onto the skin 2 (two) times daily as needed. Apply to Right shoulder as needed once a morning and remove at bedtime    linaclotide (LINZESS) 72 MCG capsule Take 1 capsule (72 mcg total) by mouth daily before breakfast.    LORazepam (ATIVAN) 2 MG/ML injection Inject 0.5 mLs (1 mg total) into the vein every 15 (fifteen) minutes as needed.    magnesium hydroxide (MILK OF MAGNESIA) 400 MG/5ML suspension Take 30 mLs by mouth daily as needed for mild constipation.    melatonin 3 MG TABS tablet Take 3 mg by mouth at bedtime.    Multiple Vitamins-Minerals (THEREMS M PO) (multivitamin with folic acid) tablet; A999333 mcg; oral Once A Day;    NON FORMULARY Diet - NAS, Cons CHO    nystatin powder Apply 1 Application topically 2 (two) times daily as needed.    omeprazole (PRILOSEC) 40 MG capsule Take 1 capsule (40 mg total) by mouth in the morning and at bedtime.    Phenylephrine-Cocoa Butter 0.25-85.5 % SUPP Place rectally.    polyethylene glycol (MIRALAX / GLYCOLAX) 17 g packet Take 17 g by mouth daily.    rOPINIRole (REQUIP) 1 MG tablet Take 1 mg by mouth at bedtime.    rosuvastatin (CRESTOR) 20 MG tablet Take 20 mg by mouth daily.    sodium zirconium cyclosilicate (LOKELMA) 10 g PACK packet Take 10 g by mouth once. 10/20/2022: Prn Stat for K+5.8   tiZANidine (ZANAFLEX) 2 MG tablet Take 2 mg by mouth every 6 (six) hours as needed for muscle spasms (for back and sciatic pain).    zinc oxide 20 % ointment Apply 1 Application topically 3 (three) times daily as needed for irritation (apply every shift).    No facility-administered encounter medications on file as of 11/02/2022.     SIGNIFICANT DIAGNOSTIC EXAMS  PREVIOUS   04-15-22; dexa: t score -3.391  NO NEW EXAMS     LABS REVIEWED PREVIOUS   11-04-21: CRp 21.2; d-dimer  1.46 11-24-21: wbc 5.4; hgb 8.7; hct 27.5; mcv 94.2 plt 218; sed rate 80; ana: neg; crp 0.6 01-15-22: hgb a1c 6.7 chol 148; ldl 87; trig 112; hdl 39; tsh 5.663  02-11-22: urine culture: multiple bacteria  04-09-22: liver normal protein 6.0 albumin 3.3; chol 143; ldl 81; trig 71; hdl 48; tsh 3.502  04-27-22; wbc 6.6; hgb 8.7; hct 26.2; mcv 94.6 plt 231; glucose 152; bun 66; creat 1.26; k+ 4.7; na++ 132; ca 8.9; gfr 42; protein 6.7 albumin 3.4 hgb a1c 7.0; vitamin D 37.96 07-09-22: wbc 9.3; hgb 8.5; hct 26.9; mcv 96.8 plt 196; glucose 200; bun 65; creat 2.15; k+ 5.0; na++ 128; ca 7.8; gfr 22 07-10-22: glucose 130; bun 59; creat 1.75; k+ 4.2; na++ 131; ca 7.6; gfr 29 07-30-22: urine micro-albumin 59.7; ACR 127   08-20-22: hgb A1c 8.8  TODAY  10-01-22: wbc 5.6; hgb 9.6; hct 29.9; mcv 94.9 plt 245; glucose 404; bun 43; creat 1.69; k+ 5.2; na++ 135; ca 8.2 gfr 30 protein 7.2 albumin 3.5 10-19-22: glucose 176; bun 45; creat 1.63; k+ 5.8; na++ 134; ca 8.9; gfr 31 10-31-22: wbc 4.6; hgb 8.4; hct 26.5; mcv 94.0 plt 183; glucose 93; bun 46; creat 1.64; k+ 5.2; na++ 134; ca 8.6; gfr 31; protein 6.5 albumin 3.2    Review of Systems  Constitutional:  Negative for malaise/fatigue.  Respiratory:  Negative for cough and shortness  of breath.   Cardiovascular:  Negative for chest pain, palpitations and leg swelling.  Gastrointestinal:  Positive for abdominal pain. Negative for constipation, diarrhea, heartburn and nausea.  Musculoskeletal:  Negative for back pain, joint pain and myalgias.  Skin: Negative.   Neurological:  Negative for dizziness.  Psychiatric/Behavioral:  The patient is not nervous/anxious.    Physical Exam Constitutional:      General: She is not in acute distress.    Appearance: She is well-developed and overweight. She is not diaphoretic.  Neck:     Thyroid: No thyromegaly.  Cardiovascular:     Rate and Rhythm: Normal rate and regular rhythm.     Pulses: Normal pulses.     Heart sounds:  Normal heart sounds.  Pulmonary:     Effort: Pulmonary effort is normal. No respiratory distress.     Breath sounds: Normal breath sounds.  Abdominal:     General: Bowel sounds are normal. There is no distension.     Palpations: Abdomen is soft.     Tenderness: There is abdominal tenderness.  Genitourinary:    Comments: foley Musculoskeletal:     Cervical back: Neck supple.     Right lower leg: No edema.     Left lower leg: No edema.     Comments: Does not move lower extremities    Lymphadenopathy:     Cervical: No cervical adenopathy.  Skin:    General: Skin is warm and dry.  Neurological:     Mental Status: She is alert and oriented to person, place, and time.  Psychiatric:        Mood and Affect: Mood normal.       ASSESSMENT/ PLAN:  TODAY  Generalized abdominal pain Hyperkalemia  Will give lokelma 10 gm today  On 11-05-22: will check hgb A1c bmp; lipids; vitamin D  We are awaiting EGD to be done.    [name] NP [facility]  call [phone]

## 2022-11-05 ENCOUNTER — Other Ambulatory Visit (HOSPITAL_COMMUNITY)
Admission: RE | Admit: 2022-11-05 | Discharge: 2022-11-05 | Disposition: A | Discharge status: Home / Self Care | Payer: Medicare HMO | Source: Skilled Nursing Facility | Attending: Adult Health | Admitting: Adult Health

## 2022-11-05 DIAGNOSIS — E1122 Type 2 diabetes mellitus with diabetic chronic kidney disease: Secondary | ICD-10-CM | POA: Insufficient documentation

## 2022-11-05 DIAGNOSIS — I129 Hypertensive chronic kidney disease with stage 1 through stage 4 chronic kidney disease, or unspecified chronic kidney disease: Secondary | ICD-10-CM | POA: Insufficient documentation

## 2022-11-05 DIAGNOSIS — Z794 Long term (current) use of insulin: Secondary | ICD-10-CM | POA: Insufficient documentation

## 2022-11-05 DIAGNOSIS — N183 Chronic kidney disease, stage 3 unspecified: Secondary | ICD-10-CM | POA: Diagnosis not present

## 2022-11-05 DIAGNOSIS — E1140 Type 2 diabetes mellitus with diabetic neuropathy, unspecified: Secondary | ICD-10-CM | POA: Diagnosis not present

## 2022-11-05 LAB — BASIC METABOLIC PANEL
Anion gap: 10 (ref 5–15)
BUN: 35 mg/dL — ABNORMAL HIGH (ref 8–23)
CO2: 18 mmol/L — ABNORMAL LOW (ref 22–32)
Calcium: 7.5 mg/dL — ABNORMAL LOW (ref 8.9–10.3)
Chloride: 109 mmol/L (ref 98–111)
Creatinine, Ser: 1.25 mg/dL — ABNORMAL HIGH (ref 0.44–1.00)
GFR, Estimated: 43 mL/min — ABNORMAL LOW (ref >60)
Glucose, Bld: 117 mg/dL — ABNORMAL HIGH (ref 70–99)
Potassium: 4.3 mmol/L (ref 3.5–5.1)
Sodium: 137 mmol/L (ref 135–145)

## 2022-11-05 LAB — LIPID PANEL
Cholesterol: 149 mg/dL (ref 0–200)
HDL: 43 mg/dL (ref >40)
LDL Cholesterol: 73 mg/dL (ref 0–99)
Total CHOL/HDL Ratio: 3.5 RATIO
Triglycerides: 166 mg/dL — ABNORMAL HIGH (ref <150)
VLDL: 33 mg/dL (ref 0–40)

## 2022-11-05 LAB — VITAMIN D 25 HYDROXY (VIT D DEFICIENCY, FRACTURES): Vit D, 25-Hydroxy: 26.12 ng/mL — ABNORMAL LOW (ref 30–100)

## 2022-11-06 LAB — HEMOGLOBIN A1C
Hgb A1c MFr Bld: 8.5 % — ABNORMAL HIGH (ref 4.8–5.6)
Mean Plasma Glucose: 197 mg/dL

## 2022-11-10 ENCOUNTER — Encounter: Payer: Self-pay | Admitting: Adult Health

## 2022-11-10 NOTE — Progress Notes (Signed)
Location:  [facility] Room Number: [phone] Place of Service:  SNF (31)   CODE STATUS: DNR  Allergies  Allergen Reactions   Ace Inhibitors Other (See Comments)    Hyperkalemia    Codeine    Sulfa Antibiotics     Chief Complaint  Patient presents with   Acute Visit    Diabetes.     HPI:    Past Medical History:  Diagnosis Date   Adult failure to thrive    Anemia    Anxiety    Atherosclerosis of aorta (HCC)    Central cord syndrome at C4 level of cervical spinal cord, subsequent encounter ([place])    CKD (chronic kidney disease)    stage 3   Depression    DM type 2 with diabetic peripheral neuropathy (HCC)    Dysphagia    GERD (gastroesophageal reflux disease)    Gout    High cholesterol    HTN (hypertension)    Hyponatremia    MDD (major depressive disorder)    Neck pain    Neuropathy    Quadriplegia, C1-C4 incomplete (HCC)    Radiculopathy    RLS (restless legs syndrome)    Urinary retention     Past Surgical History:  Procedure Laterality Date   ABDOMINAL HYSTERECTOMY     ANTERIOR CERVICAL DECOMP/DISCECTOMY FUSION N/A 02/05/2021   Procedure: Cervical Three-Four  Anterior cervical decompression/discectomy/fusion;  Surgeon: Ashok Pall, MD;  Location: [facility];  Service: Neurosurgery;  Laterality: N/A;  RM 20   APPENDECTOMY     BACK SURGERY     BIOPSY  09/22/2021   Procedure: BIOPSY;  Surgeon: [name], DO;  [facility];  Service: Endoscopy;;   CERVICAL DISC SURGERY     ESOPHAGOGASTRODUODENOSCOPY (EGD) WITH PROPOFOL N/A 09/22/2021   Procedure: ESOPHAGOGASTRODUODENOSCOPY (EGD) WITH PROPOFOL;  Surgeon: [name], DO;  [facility];  Service: Endoscopy;  Laterality: N/A;  1:00pm   HAND SURGERY     KNEE SURGERY      Social History   Socioeconomic History   Marital status: Widowed    Spouse name: Not on file   Number of children: Not on file   Years of education: Not on file   Highest education  level: Not on file  Occupational History   Not on file  Tobacco Use   Smoking status: Never   Smokeless tobacco: Never  Vaping Use   Vaping Use: Never used  Substance and Sexual Activity   Alcohol use: Never   Drug use: Never   Sexual activity: Not Currently  Other Topics Concern   Not on file  Social History Narrative   Lives at [facility]   Social Determinants of Health   Financial Resource Strain: Not on file  Food Insecurity: Not on file  Transportation Needs: Not on file  Physical Activity: Not on file  Stress: Not on file  Social Connections: Not on file  Intimate Partner Violence: Not on file   Family History  Problem Relation Age of Onset   Cancer Mother    Cardiomyopathy Father    Seizures Sister        childhood   Seizures Brother        in his 75s   Colon cancer Neg Hx       VITAL SIGNS BP 139/71   Pulse 97   Temp 98.3 F (36.8 C)   Resp 20   Ht '5\' 2"'$  (1.575 m)  Wt 173 lb 14.4 oz (78.9 kg)   SpO2 97%   BMI 31.81 kg/m   Outpatient Encounter Medications as of 11/10/2022  Medication Sig Note   acetaminophen (TYLENOL) 325 MG tablet Take 650 mg by mouth every 8 (eight) hours.    AMBULATORY NON FORMULARY MEDICATION Medication Name:  Continue monthly catheter changes with 79f catheter at skilled nursing facility.    aspirin EC 81 MG tablet Take 81 mg by mouth daily. Swallow whole.9 am    betamethasone dipropionate (DIPROLENE) 0.05 % ointment Apply 1 application  topically 2 (two) times daily as needed.    bisacodyl (DULCOLAX) 10 MG suppository Place 10 mg rectally daily.    calcium carbonate (TUMS EX) 750 MG chewable tablet Chew 1 tablet by mouth 3 (three) times daily.    camphor-menthol (SARNA) lotion Apply 1 Application topically 2 (two) times daily as needed for itching.    dapagliflozin propanediol (FARXIGA) 5 MG TABS tablet Take 5 mg by mouth daily.    denosumab (PROLIA) 60 MG/ML SOSY injection Inject 60 mg into the skin every 6 (six)  months.    DULoxetine (CYMBALTA) 20 MG capsule Take 20 mg by mouth daily. 9 am    fexofenadine (ALLEGRA) 180 MG tablet Take 180 mg by mouth daily.    gabapentin (NEURONTIN) 300 MG capsule Take 300 mg by mouth 3 (three) times daily.    insulin aspart (NOVOLOG FLEXPEN) 100 UNIT/ML FlexPen Inject 5 Units into the skin 3 (three) times daily with meals. 8 am, 12 pm, and 6 pm    Insulin Pen Needle 30G X 5 MM MISC 1 Device by Does not apply route daily. 3/16"    ipratropium-albuterol (DUONEB) 0.5-2.5 (3) MG/3ML SOLN Take 3 mLs by nebulization every 6 (six) hours as needed.    LANTUS SOLOSTAR 100 UNIT/ML Solostar Pen Inject 25 Units into the skin at bedtime.    levETIRAcetam (KEPPRA) 1000 MG tablet Take 1,000 mg by mouth daily. At 9 am, see other listings    levETIRAcetam (KEPPRA) 1000 MG tablet Take 1,000 mg by mouth at bedtime. At 9 pm with 500 mg tablet for a total of 1500 mg at bedtime    levETIRAcetam (KEPPRA) 500 MG tablet Take 500 mg by mouth at bedtime. Along with 1000 mg for a total on 1500 mg in the pm (see other listing)    lidocaine (LIDODERM) 5 % Place onto the skin 2 (two) times daily as needed. Apply to Right shoulder as needed once a morning and remove at bedtime    linaclotide (LINZESS) 72 MCG capsule Take 1 capsule (72 mcg total) by mouth daily before breakfast.    LORazepam (ATIVAN) 2 MG/ML injection Inject 0.5 mLs (1 mg total) into the vein every 15 (fifteen) minutes as needed.    melatonin 3 MG TABS tablet Take 3 mg by mouth at bedtime.    Multiple Vitamins-Minerals (THEREMS M PO) (multivitamin with folic acid) tablet; 4A999333mcg; oral Once A Day;    NON FORMULARY Diet - NAS, Cons CHO    nystatin cream (MYCOSTATIN) Apply 1 Application topically 2 (two) times daily.    omeprazole (PRILOSEC) 40 MG capsule Take 1 capsule (40 mg total) by mouth in the morning and at bedtime.    Phenylephrine-Cocoa Butter 0.25-85.5 % SUPP Place rectally.    polyethylene glycol (MIRALAX / GLYCOLAX) 17 g  packet Take 17 g by mouth daily.    rOPINIRole (REQUIP) 1 MG tablet Take 1 mg by mouth at bedtime.  rosuvastatin (CRESTOR) 20 MG tablet Take 20 mg by mouth daily.    tiZANidine (ZANAFLEX) 2 MG tablet Take 2 mg by mouth every 6 (six) hours as needed for muscle spasms (for back and sciatic pain).    Vitamin D, Ergocalciferol, (DRISDOL) 1.25 MG (50000 UNIT) CAPS capsule Take 50,000 Units by mouth every 7 (seven) days.    zinc oxide 20 % ointment Apply 1 Application topically 3 (three) times daily as needed for irritation (apply every shift).    [DISCONTINUED] Artificial Saliva (BIOTENE MOISTURIZING MOUTH MT) Use as directed 1 application. in the mouth or throat at bedtime. 9 pm    [DISCONTINUED] cephALEXin (KEFLEX) 500 MG capsule Take 1 capsule (500 mg total) by mouth 4 (four) times daily.    [DISCONTINUED] fluconazole (DIFLUCAN) 200 MG tablet Take 200 mg by mouth at bedtime.    [DISCONTINUED] fosfomycin (MONUROL) 3 g PACK Take 3 g by mouth. Q 72 hours    [DISCONTINUED] guaiFENesin (MUCINEX) 600 MG 12 hr tablet Take 600 mg by mouth 2 (two) times daily.    [DISCONTINUED] magnesium hydroxide (MILK OF MAGNESIA) 400 MG/5ML suspension Take 30 mLs by mouth daily as needed for mild constipation.    [DISCONTINUED] nystatin powder Apply 1 Application topically 2 (two) times daily as needed.    [DISCONTINUED] sodium zirconium cyclosilicate (LOKELMA) 10 g PACK packet Take 10 g by mouth once. 10/20/2022: Prn Stat for K+5.8   No facility-administered encounter medications on file as of 11/10/2022.     SIGNIFICANT DIAGNOSTIC EXAMS       ASSESSMENT/ PLAN:     [name] NP [facility]  Contact [phone] Monday through Friday 8am- 5pm  After hours call [phone]

## 2022-11-12 NOTE — Progress Notes (Signed)
This encounter was created in error - please disregard.

## 2022-11-13 NOTE — Patient Instructions (Signed)
    [name]  11/13/2022     @PREFPERIOPPHARMACY @   Your procedure is scheduled on  11/19/2022.    Report to [facility] at  [facility].   Call this number if you have problems the morning of surgery:  [phone]  If you experience any cold or flu symptoms such as cough, fever, chills, shortness of breath, etc. between now and your scheduled surgery, please notify us at the above number.   Remember:  Follow the diet instructions given to you by the office.   Your last dose of farxiga should be taken on 11/15/2022.          The night before your procedure, take 12.5 units of insulin.                            DO NOT take any medications for diabetes the morning of your procedure.                  Use your nebulizer before you come.      Take these medicines the morning of surgery with A SIP OF WATER                   Cymbalta, allegra, gabapentin, keppra, omeprazole, zanaflex(if needed).      Do not wear jewelry, make-up or nail polish.  Do not wear lotions, powders, or perfumes, or deodorant.  Do not shave 48 hours prior to surgery.  Men may shave face and neck.  Do not bring valuables to the hospital.  [facility] is not responsible for any belongings or valuables.  Contacts, dentures or bridgework may not be worn into surgery.  Leave your suitcase in the car.  After surgery it may be brought to your room.  For patients admitted to the hospital, discharge time will be determined by your treatment team.  Patients discharged the day of surgery will not be allowed to drive home.      Please read over the following fact sheets that you were given. Anesthesia Post-op Instructions and Care and Recovery After Surgery

## 2022-11-17 ENCOUNTER — Encounter (HOSPITAL_COMMUNITY)
Admission: RE | Admit: 2022-11-17 | Discharge: 2022-11-17 | Disposition: A | Discharge status: Home / Self Care | Payer: Medicare HMO | Source: Ambulatory Visit | Attending: Internal Medicine | Admitting: Internal Medicine

## 2022-11-17 NOTE — Pre-Procedure Instructions (Signed)
[facility] and spoke with Stebbins. She states that patient stopped [place] on 3/18. I faxed pre-op instructions over to them. I will call grand daughter and give her information as well.

## 2022-11-19 ENCOUNTER — Encounter (HOSPITAL_COMMUNITY)
Admission: RE | Disposition: A | Discharge status: Skilled Nursing Facility | Payer: Self-pay | Source: Home / Self Care | Attending: Internal Medicine

## 2022-11-19 ENCOUNTER — Ambulatory Visit (HOSPITAL_COMMUNITY): Payer: Medicare HMO | Admitting: Anesthesiology

## 2022-11-19 ENCOUNTER — Ambulatory Visit (HOSPITAL_BASED_OUTPATIENT_CLINIC_OR_DEPARTMENT_OTHER): Payer: Medicare HMO | Admitting: Anesthesiology

## 2022-11-19 ENCOUNTER — Ambulatory Visit (HOSPITAL_COMMUNITY)
Admission: RE | Admit: 2022-11-19 | Discharge: 2022-11-19 | Disposition: A | Discharge status: Skilled Nursing Facility | Payer: Medicare HMO | Attending: Internal Medicine | Admitting: Internal Medicine

## 2022-11-19 ENCOUNTER — Encounter (HOSPITAL_COMMUNITY): Payer: Self-pay

## 2022-11-19 DIAGNOSIS — R569 Unspecified convulsions: Secondary | ICD-10-CM | POA: Insufficient documentation

## 2022-11-19 DIAGNOSIS — E785 Hyperlipidemia, unspecified: Secondary | ICD-10-CM | POA: Insufficient documentation

## 2022-11-19 DIAGNOSIS — F419 Anxiety disorder, unspecified: Secondary | ICD-10-CM | POA: Insufficient documentation

## 2022-11-19 DIAGNOSIS — D631 Anemia in chronic kidney disease: Secondary | ICD-10-CM | POA: Diagnosis not present

## 2022-11-19 DIAGNOSIS — I10 Essential (primary) hypertension: Secondary | ICD-10-CM

## 2022-11-19 DIAGNOSIS — G8252 Quadriplegia, C1-C4 incomplete: Secondary | ICD-10-CM | POA: Diagnosis not present

## 2022-11-19 DIAGNOSIS — G8250 Quadriplegia, unspecified: Secondary | ICD-10-CM | POA: Diagnosis not present

## 2022-11-19 DIAGNOSIS — K219 Gastro-esophageal reflux disease without esophagitis: Secondary | ICD-10-CM | POA: Insufficient documentation

## 2022-11-19 DIAGNOSIS — N189 Chronic kidney disease, unspecified: Secondary | ICD-10-CM | POA: Insufficient documentation

## 2022-11-19 DIAGNOSIS — K3189 Other diseases of stomach and duodenum: Secondary | ICD-10-CM | POA: Diagnosis not present

## 2022-11-19 DIAGNOSIS — K2970 Gastritis, unspecified, without bleeding: Secondary | ICD-10-CM | POA: Diagnosis not present

## 2022-11-19 DIAGNOSIS — K31A19 Gastric intestinal metaplasia without dysplasia, unspecified site: Secondary | ICD-10-CM | POA: Insufficient documentation

## 2022-11-19 DIAGNOSIS — E1122 Type 2 diabetes mellitus with diabetic chronic kidney disease: Secondary | ICD-10-CM | POA: Insufficient documentation

## 2022-11-19 DIAGNOSIS — Z794 Long term (current) use of insulin: Secondary | ICD-10-CM | POA: Diagnosis not present

## 2022-11-19 DIAGNOSIS — I129 Hypertensive chronic kidney disease with stage 1 through stage 4 chronic kidney disease, or unspecified chronic kidney disease: Secondary | ICD-10-CM | POA: Insufficient documentation

## 2022-11-19 DIAGNOSIS — E1149 Type 2 diabetes mellitus with other diabetic neurological complication: Secondary | ICD-10-CM

## 2022-11-19 DIAGNOSIS — R6881 Early satiety: Secondary | ICD-10-CM | POA: Insufficient documentation

## 2022-11-19 DIAGNOSIS — R1310 Dysphagia, unspecified: Secondary | ICD-10-CM

## 2022-11-19 DIAGNOSIS — Z7984 Long term (current) use of oral hypoglycemic drugs: Secondary | ICD-10-CM | POA: Insufficient documentation

## 2022-11-19 DIAGNOSIS — R1013 Epigastric pain: Secondary | ICD-10-CM | POA: Diagnosis not present

## 2022-11-19 DIAGNOSIS — K5900 Constipation, unspecified: Secondary | ICD-10-CM | POA: Insufficient documentation

## 2022-11-19 DIAGNOSIS — K31A0 Gastric intestinal metaplasia, unspecified: Secondary | ICD-10-CM | POA: Diagnosis not present

## 2022-11-19 DIAGNOSIS — F32A Depression, unspecified: Secondary | ICD-10-CM | POA: Insufficient documentation

## 2022-11-19 HISTORY — PX: BIOPSY: SHX5522

## 2022-11-19 HISTORY — PX: BALLOON DILATION: SHX5330

## 2022-11-19 HISTORY — PX: ESOPHAGOGASTRODUODENOSCOPY (EGD) WITH PROPOFOL: SHX5813

## 2022-11-19 LAB — GLUCOSE, CAPILLARY: Glucose-Capillary: 132 mg/dL — ABNORMAL HIGH (ref 70–99)

## 2022-11-19 SURGERY — ESOPHAGOGASTRODUODENOSCOPY (EGD) WITH PROPOFOL
Anesthesia: General

## 2022-11-19 MED ORDER — LIDOCAINE HCL (CARDIAC) PF 100 MG/5ML IV SOSY
PREFILLED_SYRINGE | INTRAVENOUS | Status: DC | PRN
  Administered 2022-11-19: 80 mg via INTRAVENOUS

## 2022-11-19 MED ORDER — LACTATED RINGERS IV SOLN: INTRAVENOUS | Status: DC

## 2022-11-19 MED ORDER — PROPOFOL 10 MG/ML IV BOLUS
INTRAVENOUS | Status: DC | PRN
  Administered 2022-11-19: 30 mg via INTRAVENOUS
  Administered 2022-11-19: 50 mg via INTRAVENOUS

## 2022-11-19 MED ORDER — EPHEDRINE SULFATE (PRESSORS) 50 MG/ML IJ SOLN
INTRAMUSCULAR | Status: DC | PRN
  Administered 2022-11-19: 5 mg via INTRAVENOUS

## 2022-11-19 NOTE — Discharge Instructions (Addendum)
EGD Discharge instructions Please read the instructions outlined below and refer to this sheet in the next few weeks. These discharge instructions provide you with general information on caring for yourself after you leave the hospital. Your doctor may also give you specific instructions. While your treatment has been planned according to the most current medical practices available, unavoidable complications occasionally occur. If you have any problems or questions after discharge, please call your doctor. ACTIVITY You may resume your regular activity but move at a slower pace for the next 24 hours.  Take frequent rest periods for the next 24 hours.  Walking will help expel (get rid of) the air and reduce the bloated feeling in your abdomen.  No driving for 24 hours (because of the anesthesia (medicine) used during the test).  You may shower.  Do not sign any important legal documents or operate any machinery for 24 hours (because of the anesthesia used during the test).  NUTRITION Drink plenty of fluids.  You may resume your normal diet.  Begin with a light meal and progress to your normal diet.  Avoid alcoholic beverages for 24 hours or as instructed by your caregiver.  MEDICATIONS You may resume your normal medications unless your caregiver tells you otherwise.  WHAT YOU CAN EXPECT TODAY You may experience abdominal discomfort such as a feeling of fullness or "gas" pains.  FOLLOW-UP Your doctor will discuss the results of your test with you.  SEEK IMMEDIATE MEDICAL ATTENTION IF ANY OF THE FOLLOWING OCCUR: Excessive nausea (feeling sick to your stomach) and/or vomiting.  Severe abdominal pain and distention (swelling).  Trouble swallowing.  Temperature over 101 F (37.8 C).  Rectal bleeding or vomiting of blood.   Your EGD revealed mild amount inflammation in your stomach.  I took biopsies of this to rule out infection with a bacteria called H. pylori.  Await pathology results, my  office will contact you.  I did not see any strictures or tightenings of your esophagus.  I elected not to stretch your esophagus today.  Small bowel appeared normal.  Continue on omeprazole twice daily.  Follow-up in GI clinic in 2 to 3 months.   I hope you have a great rest of your week!  Elon Alas. Abbey Chatters, D.O. Gastroenterology and Hepatology [facility]

## 2022-11-19 NOTE — Interval H&P Note (Signed)
History and Physical Interval Note:  11/19/2022 11:25 AM  [name]  has presented today for surgery, with the diagnosis of dysphagia,GERD.  The various methods of treatment have been discussed with the patient and family. After consideration of risks, benefits and other options for treatment, the patient has consented to  Procedure(s) with comments: ESOPHAGOGASTRODUODENOSCOPY (EGD) WITH PROPOFOL (N/A) - 11:30 am, asa 3 BALLOON DILATION (N/A) as a surgical intervention.  The patient's history has been reviewed, patient examined, no change in status, stable for surgery.  I have reviewed the patient's chart and labs.  Questions were answered to the patient's satisfaction.     [name]

## 2022-11-19 NOTE — Anesthesia Preprocedure Evaluation (Signed)
Anesthesia Evaluation  Patient identified by MRN, date of birth, ID band Patient awake and Patient confused    Reviewed: Allergy & Precautions, H&P , NPO status , Patient's Chart, lab work & pertinent test results  Airway Mallampati: II  TM Distance: >3 FB Neck ROM: Full    Dental  (+) Edentulous Upper, Edentulous Lower   Pulmonary neg pulmonary ROS   Pulmonary exam normal breath sounds clear to auscultation       Cardiovascular Exercise Tolerance: Poor hypertension, Pt. on medications Normal cardiovascular exam Rhythm:Regular Rate:Normal     Neuro/Psych Seizures -, Well Controlled,  PSYCHIATRIC DISORDERS Anxiety Depression     Neuromuscular disease (Quadriplegia, C1-C4 incomplete ([place]))    GI/Hepatic Neg liver ROS,GERD  Medicated and Poorly Controlled,,  Endo/Other  diabetes, Well Controlled, Type 2, Oral Hypoglycemic Agents, Insulin DependentHypothyroidism    Renal/GU Renal disease  negative genitourinary   Musculoskeletal  (+) Arthritis ,    Abdominal   Peds negative pediatric ROS (+)  Hematology  (+) Blood dyscrasia, anemia   Anesthesia Other Findings   Reproductive/Obstetrics negative OB ROS                             Anesthesia Physical Anesthesia Plan  ASA: 4  Anesthesia Plan: General   Post-op Pain Management: Minimal or no pain anticipated   Induction: Intravenous  PONV Risk Score and Plan: 1 and Propofol infusion  Airway Management Planned: Nasal Cannula, Natural Airway and Simple Face Mask  Additional Equipment:   Intra-op Plan:   Post-operative Plan: Possible Post-op intubation/ventilation  Informed Consent: I have reviewed the patients History and Physical, chart, labs and discussed the procedure including the risks, benefits and alternatives for the proposed anesthesia with the patient or authorized representative who has indicated his/her understanding and  acceptance.    Discussed DNR with power of attorney and Continue DNR.   Dental advisory given  Plan Discussed with: CRNA and Surgeon  Anesthesia Plan Comments: (Daughter agreed that in case of cardiac arrest/respiratory depression, ok to resuscitate briefly, do not want prolonged resuscitation, daughter gave permission to intubate and give antibiotic treatment for aspiration.)       Anesthesia Quick Evaluation

## 2022-11-19 NOTE — Anesthesia Postprocedure Evaluation (Signed)
Anesthesia Post Note  Patient: [name]  Procedure(s) Performed: ESOPHAGOGASTRODUODENOSCOPY (EGD) WITH PROPOFOL BALLOON DILATION BIOPSY  Patient location during evaluation: Phase II Anesthesia Type: General Level of consciousness: awake and alert and oriented Pain management: pain level controlled Vital Signs Assessment: post-procedure vital signs reviewed and stable Respiratory status: spontaneous breathing, nonlabored ventilation and respiratory function stable Cardiovascular status: blood pressure returned to baseline and stable Postop Assessment: no apparent nausea or vomiting Anesthetic complications: no  No notable events documented.   Last Vitals:  Vitals:   11/19/22 1013 11/19/22 1153  BP: 137/84 (!) 93/56  Pulse: 80 73  Resp: (!) 22 19  Temp: 36.9 C 36.4 C  SpO2: 99% 96%    Last Pain:  Vitals:   11/19/22 1153  TempSrc: Oral  PainSc: 0-No pain                 Amanada Philbrick C Liseth Wann

## 2022-11-19 NOTE — Transfer of Care (Signed)
Immediate Anesthesia Transfer of Care Note  Patient: [name]  Procedure(s) Performed: ESOPHAGOGASTRODUODENOSCOPY (EGD) WITH PROPOFOL BALLOON DILATION BIOPSY  Patient Location: Short Stay  Anesthesia Type:General  Level of Consciousness: awake and patient cooperative  Airway & Oxygen Therapy: Patient Spontanous Breathing  Post-op Assessment: Report given to RN and Post -op Vital signs reviewed and stable  Post vital signs: Reviewed and stable  Last Vitals:  Vitals Value Taken Time  BP 93/56 11/19/22 1153  Temp 36.4 C 11/19/22 1153  Pulse 73 11/19/22 1153  Resp 19 11/19/22 1153  SpO2 96 % 11/19/22 1153    Last Pain:  Vitals:   11/19/22 1153  TempSrc: Oral  PainSc: 0-No pain         Complications: No notable events documented.

## 2022-11-19 NOTE — Anesthesia Procedure Notes (Signed)
Date/Time: 11/19/2022 11:30 AM  Performed by: Vista Deck, CRNAPre-anesthesia Checklist: Patient identified, Emergency Drugs available, Suction available, Timeout performed and Patient being monitored Patient Re-evaluated:Patient Re-evaluated prior to induction Oxygen Delivery Method: Nasal Cannula

## 2022-11-19 NOTE — Op Note (Signed)
[facility] Patient Name: [name] Procedure Date: 11/19/2022 11:26 AM MRN: [id number] Date of Birth: 02-06-1936 Attending MD: Elon Alas. Abbey Chatters , [place], [id number] CSN: [id number] Age: 87 Admit Type: Outpatient Procedure:                Upper GI endoscopy Indications:              Epigastric abdominal pain, Dysphagia, Early satiety Providers:                Elon Alas. Abbey Chatters, DO, Caprice Kluver, Raphael Gibney,                            Technician Referring MD:              Medicines:                See the Anesthesia note for documentation of the                            administered medications Complications:            No immediate complications. Estimated Blood Loss:     Estimated blood loss was minimal. Procedure:                Pre-Anesthesia Assessment:                           - The anesthesia plan was to use monitored                            anesthesia care (MAC).                           After obtaining informed consent, the endoscope was                            passed under direct vision. Throughout the                            procedure, the patient's blood pressure, pulse, and                            oxygen saturations were monitored continuously. The                            [id number] scope was introduced through the                            mouth, and advanced to the second part of duodenum.                            The upper GI endoscopy was accomplished without                            difficulty. The patient tolerated the procedure                            well. Scope  In: 11:40:55 AM Scope Out: 11:44:48 AM Total Procedure Duration: 0 hours 3 minutes 53 seconds  Findings:      There is no endoscopic evidence of stenosis or stricture in the entire       esophagus.      Patchy mild inflammation characterized by erythema was found in the       gastric body. Biopsies were taken with a cold forceps for Helicobacter       pylori  testing.      The duodenal bulb, first portion of the duodenum and second portion of       the duodenum were normal. Impression:               - Gastritis. Biopsied.                           - Normal duodenal bulb, first portion of the                            duodenum and second portion of the duodenum. Moderate Sedation:      Per Anesthesia Care Recommendation:           - Patient has a contact number available for                            emergencies. The signs and symptoms of potential                            delayed complications were discussed with the                            patient. Return to normal activities tomorrow.                            Written discharge instructions were provided to the                            patient.                           - Resume previous diet.                           - Continue present medications.                           - Await pathology results.                           - Use Protonix (pantoprazole) 40 mg PO BID.                           - Return to GI clinic in 3 months. Procedure Code(s):        --- Professional ---                           [id number], Esophagogastroduodenoscopy, flexible,  transoral; with biopsy, single or multiple Diagnosis Code(s):        --- Professional ---                           K29.70, Gastritis, unspecified, without bleeding                           R10.13, Epigastric pain                           R13.10, Dysphagia, unspecified                           R68.81, Early satiety CPT copyright 2022 [employer] Association. All rights reserved. The codes documented in this report are preliminary and upon coder review may  be revised to meet current compliance requirements. Elon Alas. Abbey Chatters, DO [place] Abbey Chatters, DO 11/19/2022 11:47:37 AM This report has been signed electronically. Number of Addenda: 0

## 2022-11-20 LAB — SURGICAL PATHOLOGY

## 2022-11-25 ENCOUNTER — Non-Acute Institutional Stay (SKILLED_NURSING_FACILITY): Payer: Medicare HMO | Admitting: Adult Health

## 2022-11-25 ENCOUNTER — Encounter: Payer: Self-pay | Admitting: Adult Health

## 2022-11-25 DIAGNOSIS — N1832 Chronic kidney disease, stage 3b: Secondary | ICD-10-CM | POA: Diagnosis not present

## 2022-11-25 DIAGNOSIS — E785 Hyperlipidemia, unspecified: Secondary | ICD-10-CM

## 2022-11-25 DIAGNOSIS — E1122 Type 2 diabetes mellitus with diabetic chronic kidney disease: Secondary | ICD-10-CM | POA: Diagnosis not present

## 2022-11-25 DIAGNOSIS — E1169 Type 2 diabetes mellitus with other specified complication: Secondary | ICD-10-CM | POA: Diagnosis not present

## 2022-11-25 DIAGNOSIS — R569 Unspecified convulsions: Secondary | ICD-10-CM | POA: Diagnosis not present

## 2022-11-25 DIAGNOSIS — Z794 Long term (current) use of insulin: Secondary | ICD-10-CM | POA: Diagnosis not present

## 2022-11-25 DIAGNOSIS — M810 Age-related osteoporosis without current pathological fracture: Secondary | ICD-10-CM | POA: Diagnosis not present

## 2022-11-25 NOTE — Progress Notes (Signed)
Location:  [facility] Room Number: 154 Place of Service:  SNF (31)   CODE STATUS: dnr  Allergies  Allergen Reactions   Ace Inhibitors Other (See Comments)    Hyperkalemia    Codeine     Unknown reaction   Sulfa Antibiotics     Unknown reaction    Chief Complaint  Patient presents with   Medical Management of Chronic Issues            Seizure:  Type 2 diabetes mellitus with diabetic neuropathy with long term current use of insulin:  Hyperlipidemia associated with type 2 diabetes mellitus:  Post menopausal osteoporosis        HPI:  She is a 87 year old long term resident of this facility being seen for the management of her chronic illnesses:  Seizure:  Type 2 diabetes mellitus with diabetic neuropathy with long term current use of insulin:  Hyperlipidemia associated with type 2 diabetes mellitus:  Post menopausal osteoporosis. There are no reports of uncontrolled pain. She does get out of bed several times weekly. Appetite is good.   Past Medical History:  Diagnosis Date   Adult failure to thrive    Anemia    Anxiety    Atherosclerosis of aorta (HCC)    Central cord syndrome at C4 level of cervical spinal cord, subsequent encounter ([place])    CKD (chronic kidney disease)    stage 3   Depression    DM type 2 with diabetic peripheral neuropathy (HCC)    Dysphagia    GERD (gastroesophageal reflux disease)    Gout    High cholesterol    HTN (hypertension)    Hyponatremia    MDD (major depressive disorder)    Neck pain    Neuropathy    Quadriplegia, C1-C4 incomplete (HCC)    Radiculopathy    RLS (restless legs syndrome)    Urinary retention     Past Surgical History:  Procedure Laterality Date   ABDOMINAL HYSTERECTOMY     ANTERIOR CERVICAL DECOMP/DISCECTOMY FUSION N/A 02/05/2021   Procedure: Cervical Three-Four  Anterior cervical decompression/discectomy/fusion;  Surgeon: Ashok Pall, MD;  Location: [place];  Service: Neurosurgery;   Laterality: N/A;  RM 20   APPENDECTOMY     BACK SURGERY     BIOPSY  09/22/2021   Procedure: BIOPSY;  Surgeon: [name], DO;  [facility];  Service: Endoscopy;;   CERVICAL DISC SURGERY     ESOPHAGOGASTRODUODENOSCOPY (EGD) WITH PROPOFOL N/A 09/22/2021   Procedure: ESOPHAGOGASTRODUODENOSCOPY (EGD) WITH PROPOFOL;  Surgeon: [name], DO;  [facility];  Service: Endoscopy;  Laterality: N/A;  1:00pm   HAND SURGERY     KNEE SURGERY      Social History   Socioeconomic History   Marital status: Widowed    Spouse name: Not on file   Number of children: Not on file   Years of education: Not on file   Highest education level: Not on file  Occupational History   Not on file  Tobacco Use   Smoking status: Never   Smokeless tobacco: Never  Vaping Use   Vaping Use: Never used  Substance and Sexual Activity   Alcohol use: Never   Drug use: Never   Sexual activity: Not Currently  Other Topics Concern   Not on file  Social History Narrative   Lives at [facility]   Social Determinants of Health   Financial Resource Strain: Not on  file  Food Insecurity: Not on file  Transportation Needs: Not on file  Physical Activity: Not on file  Stress: Not on file  Social Connections: Not on file  Intimate Partner Violence: Not on file   Family History  Problem Relation Age of Onset   Cancer Mother    Cardiomyopathy Father    Seizures Sister        childhood   Seizures Brother        in his 62s   Colon cancer Neg Hx       VITAL SIGNS BP (!) 142/86   Pulse 74   Temp (!) 97.4 F (36.3 C)   Resp 20   Ht 5\' 2"  (1.575 m)   Wt 173 lb 14.4 oz (78.9 kg)   SpO2 96%   BMI 31.81 kg/m   Outpatient Encounter Medications as of 11/25/2022  Medication Sig   acetaminophen (TYLENOL) 325 MG tablet Take 650 mg by mouth every 8 (eight) hours.   AMBULATORY NON FORMULARY MEDICATION Medication Name:  Continue monthly catheter changes with 45fr catheter  at skilled nursing facility.   aspirin EC 81 MG tablet Take 81 mg by mouth daily. Swallow whole.9 am   augmented betamethasone dipropionate (DIPROLENE-AF) 0.05 % ointment Apply 1 Application topically 2 (two) times daily as needed. Apply to stomach and both breast   bisacodyl (DULCOLAX) 10 MG suppository Place 10 mg rectally every Wednesday. At 2pm   calcium carbonate (TUMS EX) 750 MG chewable tablet Chew 1 tablet by mouth 3 (three) times daily.   camphor-menthol (SARNA) lotion Apply 1 Application topically 2 (two) times daily as needed for itching.   dapagliflozin propanediol (FARXIGA) 5 MG TABS tablet Take 5 mg by mouth daily.   denosumab (PROLIA) 60 MG/ML SOSY injection Inject 60 mg into the skin every 6 (six) months.   DULoxetine (CYMBALTA) 20 MG capsule Take 20 mg by mouth daily.   fexofenadine (ALLEGRA) 180 MG tablet Take 180 mg by mouth daily.   gabapentin (NEURONTIN) 300 MG capsule Take 300 mg by mouth 3 (three) times daily.   insulin aspart (NOVOLOG FLEXPEN) 100 UNIT/ML FlexPen Inject 5 Units into the skin 3 (three) times daily with meals. 8 am, 12 pm, and 6 pm   Insulin Pen Needle 30G X 5 MM MISC 1 Device by Does not apply route daily. 3/16"   ipratropium-albuterol (DUONEB) 0.5-2.5 (3) MG/3ML SOLN Take 3 mLs by nebulization every 6 (six) hours as needed (wheezing).   LANTUS SOLOSTAR 100 UNIT/ML Solostar Pen Inject 25 Units into the skin at bedtime.   levETIRAcetam (KEPPRA) 1000 MG tablet Take 1,000 mg by mouth 2 (two) times daily.   levETIRAcetam (KEPPRA) 500 MG tablet Take 500 mg by mouth at bedtime. Along with 1000 mg for a total on 1500 mg in the pm (see other listing)   lidocaine (LIDODERM) 5 % Place 1 patch onto the skin 2 (two) times daily as needed (pain). Apply to Right shoulder as needed once a morning and remove at bedtime   linaclotide (LINZESS) 72 MCG capsule Take 1 capsule (72 mcg total) by mouth daily before breakfast.   LORazepam (ATIVAN) 2 MG/ML injection Inject 0.5 mLs  (1 mg total) into the vein every 15 (fifteen) minutes as needed. (Patient taking differently: Inject 1 mg into the vein every 15 (fifteen) minutes as needed for seizure. Seizures lasting longer than 1 minute)   melatonin 3 MG TABS tablet Take 3 mg by mouth at bedtime.   Mouthwashes (BIOTENE DRY  MOUTH MT) Use as directed 1 spray in the mouth or throat at bedtime.   Multiple Vitamins-Minerals (THEREMS M PO) Take 1 tablet by mouth daily. (multivitamin with folic acid) tablet; [id number] mcg;   NON FORMULARY Diet - NAS, Cons CHO   nystatin cream (MYCOSTATIN) Apply 1 Application topically 2 (two) times daily as needed (vaginal itching or pain).   omeprazole (PRILOSEC) 40 MG capsule Take 1 capsule (40 mg total) by mouth in the morning and at bedtime.   Phenylephrine-Cocoa Butter 0.25-85.5 % SUPP Place 1 suppository rectally 2 (two) times daily.   polyethylene glycol (MIRALAX / GLYCOLAX) 17 g packet Take 17 g by mouth daily.   rOPINIRole (REQUIP) 1 MG tablet Take 1 mg by mouth at bedtime.   rosuvastatin (CRESTOR) 20 MG tablet Take 20 mg by mouth every evening.   tiZANidine (ZANAFLEX) 2 MG tablet Take 2 mg by mouth every 6 (six) hours as needed for muscle spasms (for back and sciatic pain).   Vitamin D, Ergocalciferol, (DRISDOL) 1.25 MG (50000 UNIT) CAPS capsule Take 50,000 Units by mouth every Monday.   zinc oxide 20 % ointment Apply 1 Application topically See admin instructions. Apply every shift to bilateral buttocks, sacrum, and coccyx   No facility-administered encounter medications on file as of 11/25/2022.     SIGNIFICANT DIAGNOSTIC EXAMS  PREVIOUS   04-15-22; dexa: t score -3.391  NO NEW EXAMS     LABS REVIEWED PREVIOUS   11-04-21: CRp 21.2; d-dimer 1.46 11-24-21: wbc 5.4; hgb 8.7; hct 27.5; mcv 94.2 plt 218; sed rate 80; ana: neg; crp 0.6 01-15-22: hgb a1c 6.7 chol 148; ldl 87; trig 112; hdl 39; tsh 5.663  02-11-22: urine culture: multiple bacteria  04-09-22: liver normal protein 6.0 albumin  3.3; chol 143; ldl 81; trig 71; hdl 48; tsh 3.502  04-27-22; wbc 6.6; hgb 8.7; hct 26.2; mcv 94.6 plt 231; glucose 152; bun 66; creat 1.26; k+ 4.7; na++ 132; ca 8.9; gfr 42; protein 6.7 albumin 3.4 hgb a1c 7.0; vitamin D 37.96 07-09-22: wbc 9.3; hgb 8.5; hct 26.9; mcv 96.8 plt 196; glucose 200; bun 65; creat 2.15; k+ 5.0; na++ 128; ca 7.8; gfr 22 07-10-22: glucose 130; bun 59; creat 1.75; k+ 4.2; na++ 131; ca 7.6; gfr 29 07-30-22: urine micro-albumin 59.7; ACR 127   08-20-22: hgb A1c 8.8  TODAY  10-01-22: wbc 5.6; hgb 9.6; hct 29.9; mcv 94.9 plt 245; glucose 404; bun 43; creat 1.69; k+ 5.2; na++ 135; ca 8.2 gfr 30 protein 7.2 albumin 3.5 10-19-22: glucose 176; bun 45; creat 1.63; k+ 5.8; na++ 134; ca 8.9; gfr 31 10-31-22: wbc 4.6; hgb 8.4; hct 26.5; mcv 94.0 plt 183; glucose 93; bun 46; creat 1.64; k+ 5.2; na++ 134; ca 8.6; gfr 31; protein 6.5 albumin 3.2  11-05-22: glucose 117; bun 35; creat 1.25; k+ 4.3;na++ 137; ca 7.5; gfr 43; hgb A1c 8.5; vitamin D 26.12; chol 149; ldl 73 trig 166; hdl 43   Review of Systems  Constitutional:  Negative for malaise/fatigue.  Respiratory:  Negative for cough and shortness of breath.   Cardiovascular:  Negative for chest pain, palpitations and leg swelling.  Gastrointestinal:  Negative for abdominal pain, constipation and heartburn.  Musculoskeletal:  Negative for back pain, joint pain and myalgias.  Skin: Negative.   Neurological:  Negative for dizziness.  Psychiatric/Behavioral:  The patient is not nervous/anxious.    Physical Exam Constitutional:      General: She is not in acute distress.    Appearance: She  is well-developed. She is not diaphoretic.  Neck:     Thyroid: No thyromegaly.  Cardiovascular:     Rate and Rhythm: Normal rate and regular rhythm.     Pulses: Normal pulses.     Heart sounds: Normal heart sounds.  Pulmonary:     Effort: Pulmonary effort is normal. No respiratory distress.     Breath sounds: Normal breath sounds.  Abdominal:      General: Bowel sounds are normal. There is no distension.     Palpations: Abdomen is soft.     Tenderness: There is no abdominal tenderness.  Genitourinary:    Comments: foley Musculoskeletal:     Cervical back: Neck supple.     Right lower leg: No edema.     Left lower leg: No edema.     Comments: Does not move lower extremities   Lymphadenopathy:     Cervical: No cervical adenopathy.  Skin:    General: Skin is warm and dry.  Neurological:     Mental Status: She is alert and oriented to person, place, and time.  Psychiatric:        Mood and Affect: Mood normal.     ASSESSMENT/ PLAN:   TODAY  Seizure: is stable will continue keppra 100 mg in the AM and 1500 mg in the PM has prn ativan and is followed by neurology  2. Type 2 diabetes mellitus with diabetic neuropathy with long term current use of insulin: hgb A1c 8.5 will continue lantus 25 units nightly novolog 5 unit with meals   3. Hyperlipidemia associated with type 2 diabetes mellitus: b/p 142/86 will start norvasc 5 mg daily   4. Post menopausal osteoporosis t score -3.391 will continue prolia every 6 months on calcium    PREVIOUS   5. GERD without esophagitis: will continue prilosec 40 mg twice  daily is off reglan  6. Chronic constipation: miralax daily dulcolax supp weekly   7. Lumbar radicular syndrome/vascular necrosis bilateral hips: will continue tylenol 650 mg every 8 hours; gabapentin 300 mg three times daily cymbalta 20 mg daily   8. Chronic cough; will continue I/S four times daily    9. Diabetic peripheral neuropathy: will continue gabapentin 300 mg three times daily  10. CKD stage 4 due to type 2 diabetes mellitus: bun 35; creat 1.25 gfr 43  11. anemia associated with type 2 diabetes mellitus due to underlying condition: hgb 8.5 will monitor  12. Urine retention: is chronic has long term foley  13. Chronic anxiety: is currently off medications.   14. Protein calorie malnutrition severe: albumin  3.4; protein 6.7 will continue supplements as directed  15. Aortic atherosclerosis (ct 01-26-21) is on asa and statin  16. Central cord syndrome at C4 level of cervical spine compression subsequent encounter/cord compression quadriplegia  C1-4 incomplete (01-25-21); asa 81 mg daily has zanaflex 2 mg every 6 hours as needed for spasticity  17. Restless leg syndrome: will continue requip 1 mg nightly   [name] NP [facility]   call [phone]

## 2022-12-01 ENCOUNTER — Encounter (HOSPITAL_COMMUNITY): Payer: Self-pay | Admitting: Internal Medicine

## 2022-12-07 ENCOUNTER — Other Ambulatory Visit: Payer: Self-pay | Admitting: Adult Health

## 2022-12-07 MED ORDER — LORAZEPAM 2 MG/ML IJ SOLN: 1.0000 mg | INTRAMUSCULAR | 0 refills | Status: DC | PRN

## 2022-12-11 ENCOUNTER — Non-Acute Institutional Stay (SKILLED_NURSING_FACILITY): Payer: Medicare HMO | Admitting: Adult Health

## 2022-12-11 ENCOUNTER — Encounter: Payer: Self-pay | Admitting: Adult Health

## 2022-12-11 DIAGNOSIS — I129 Hypertensive chronic kidney disease with stage 1 through stage 4 chronic kidney disease, or unspecified chronic kidney disease: Secondary | ICD-10-CM | POA: Diagnosis not present

## 2022-12-11 DIAGNOSIS — F339 Major depressive disorder, recurrent, unspecified: Secondary | ICD-10-CM

## 2022-12-11 DIAGNOSIS — E1122 Type 2 diabetes mellitus with diabetic chronic kidney disease: Secondary | ICD-10-CM | POA: Diagnosis not present

## 2022-12-11 DIAGNOSIS — N1831 Chronic kidney disease, stage 3a: Secondary | ICD-10-CM

## 2022-12-11 DIAGNOSIS — I700 Atherosclerosis of aorta: Secondary | ICD-10-CM

## 2022-12-11 NOTE — Progress Notes (Signed)
Location:  [facility] Room Number: 154 Place of Service:  SNF (31)   CODE STATUS: DNR  Allergies  Allergen Reactions   Ace Inhibitors Other (See Comments)    Hyperkalemia    Codeine     Unknown reaction   Sulfa Antibiotics     Unknown reaction    Chief Complaint  Patient presents with   Acute Visit    Care Plan Meeting    HPI:  We have come together for her care plan meeting. BIMS 15/15 mood 8/30: no eating well; decreased energy; nervous. She spends nearly all of her time in bed per her choice; no falls. She is dependent care with her adls. She is incontinent of bowel; has foley. Dietary: regular diet; built up utensils; appetite 1-100%; weight is 173.6 pounds; feeds self. Activities: one on ones. Therapy: none at this time. She continues to be followed for her chronic illnesses including: Hypertension associated with stage 3a chronic kidney disease due type 2 diabetes mellitus  Aortic atherosclerosis  Major depression chronic recurrent  Past Medical History:  Diagnosis Date   Adult failure to thrive    Anemia    Anxiety    Atherosclerosis of aorta    Central cord syndrome at C4 level of cervical spinal cord, subsequent encounter    CKD (chronic kidney disease)    stage 3   Depression    DM type 2 with diabetic peripheral neuropathy    Dysphagia    GERD (gastroesophageal reflux disease)    Gout    High cholesterol    HTN (hypertension)    Hyponatremia    MDD (major depressive disorder)    Neck pain    Neuropathy    Quadriplegia, C1-C4 incomplete    Radiculopathy    RLS (restless legs syndrome)    Urinary retention     Past Surgical History:  Procedure Laterality Date   ABDOMINAL HYSTERECTOMY     ANTERIOR CERVICAL DECOMP/DISCECTOMY FUSION N/A 02/05/2021   Procedure: Cervical Three-Four  Anterior cervical decompression/discectomy/fusion;  Surgeon: Coletta Memos, MD;  Location: [facility];  Service: Neurosurgery;  Laterality: N/A;  RM 20    APPENDECTOMY     BACK SURGERY     BALLOON DILATION N/A 11/19/2022   Procedure: BALLOON DILATION;  Surgeon: Lanelle Bal, DO;  [facility];  Service: Endoscopy;  Laterality: N/A;   BIOPSY  09/22/2021   Procedure: BIOPSY;  Surgeon: Lanelle Bal, DO;  [facility];  Service: Endoscopy;;   BIOPSY  11/19/2022   Procedure: BIOPSY;  Surgeon: Lanelle Bal, DO;  [facility];  Service: Endoscopy;;   CERVICAL DISC SURGERY     ESOPHAGOGASTRODUODENOSCOPY (EGD) WITH PROPOFOL N/A 09/22/2021   Procedure: ESOPHAGOGASTRODUODENOSCOPY (EGD) WITH PROPOFOL;  Surgeon: Lanelle Bal, DO;  [facility];  Service: Endoscopy;  Laterality: N/A;  1:00pm   ESOPHAGOGASTRODUODENOSCOPY (EGD) WITH PROPOFOL N/A 11/19/2022   Procedure: ESOPHAGOGASTRODUODENOSCOPY (EGD) WITH PROPOFOL;  Surgeon: Lanelle Bal, DO;  [facility];  Service: Endoscopy;  Laterality: N/A;  11:30 am, asa 3   HAND SURGERY     KNEE SURGERY      Social History   Socioeconomic History   Marital status: Widowed    Spouse name: Not on file   Number of children: Not on file   Years of education: Not on file   Highest education level: Not on file  Occupational History   Not on file  Tobacco  Use   Smoking status: Never   Smokeless tobacco: Never  Vaping Use   Vaping Use: Never used  Substance and Sexual Activity   Alcohol use: Never   Drug use: Never   Sexual activity: Not Currently  Other Topics Concern   Not on file  Social History Narrative   Lives at [facility]   Social Determinants of Health   Financial Resource Strain: Not on file  Food Insecurity: Not on file  Transportation Needs: Not on file  Physical Activity: Not on file  Stress: Not on file  Social Connections: Not on file  Intimate Partner Violence: Not on file   Family History  Problem Relation Age of Onset   Cancer Mother    Cardiomyopathy Father    Seizures Sister        childhood    Seizures Brother        in his 59s   Colon cancer Neg Hx       VITAL SIGNS BP 138/80   Pulse 79   Temp 98.6 F (37 C)   Resp 18   Ht 5\' 2"  (1.575 m)   Wt 173 lb 6 oz (78.6 kg)   SpO2 94%   BMI 31.71 kg/m   Outpatient Encounter Medications as of 12/11/2022  Medication Sig   acetaminophen (TYLENOL) 325 MG tablet Take 650 mg by mouth every 8 (eight) hours.   AMBULATORY NON FORMULARY MEDICATION Medication Name:  Continue monthly catheter changes with 51fr catheter at skilled nursing facility.   amLODipine (NORVASC) 5 MG tablet Take 5 mg by mouth daily.   aspirin EC 81 MG tablet Take 81 mg by mouth daily. Swallow whole.9 am   augmented betamethasone dipropionate (DIPROLENE-AF) 0.05 % ointment Apply 1 Application topically 2 (two) times daily as needed. Apply to stomach and both breast   calcium carbonate (TUMS EX) 750 MG chewable tablet Chew 1 tablet by mouth 3 (three) times daily.   camphor-menthol (SARNA) lotion Apply 1 Application topically 2 (two) times daily as needed for itching.   dapagliflozin propanediol (FARXIGA) 5 MG TABS tablet Take 5 mg by mouth daily.   DULoxetine (CYMBALTA) 20 MG capsule Take 20 mg by mouth daily.   fexofenadine (ALLEGRA) 180 MG tablet Take 180 mg by mouth daily.   gabapentin (NEURONTIN) 300 MG capsule Take 300 mg by mouth 3 (three) times daily.   insulin aspart (NOVOLOG FLEXPEN) 100 UNIT/ML FlexPen Inject 5 Units into the skin 3 (three) times daily with meals. 8 am, 12 pm, and 6 pm   Insulin Pen Needle 30G X 5 MM MISC 1 Device by Does not apply route daily. 3/16"   ipratropium-albuterol (DUONEB) 0.5-2.5 (3) MG/3ML SOLN Take 3 mLs by nebulization every 6 (six) hours as needed (wheezing).   LANTUS SOLOSTAR 100 UNIT/ML Solostar Pen Inject 25 Units into the skin at bedtime.   levETIRAcetam (KEPPRA) 1000 MG tablet Take 1,000 mg by mouth 2 (two) times daily.   levETIRAcetam (KEPPRA) 500 MG tablet Take 500 mg by mouth at bedtime. Along with 1000 mg for a  total on 1500 mg in the pm (see other listing)   lidocaine (LIDODERM) 5 % Place 1 patch onto the skin 2 (two) times daily as needed (pain). Apply to Right shoulder as needed once a morning and remove at bedtime   linaclotide (LINZESS) 72 MCG capsule Take 1 capsule (72 mcg total) by mouth daily before breakfast.   LORazepam (ATIVAN) 2 MG/ML injection Inject 0.5 mLs (1 mg  total) into the muscle every 15 (fifteen) minutes as needed.   melatonin 3 MG TABS tablet Take 3 mg by mouth at bedtime.   Multiple Vitamins-Minerals (THEREMS M PO) Take 1 tablet by mouth daily. (multivitamin with folic acid) tablet; 400 mcg;   NON FORMULARY Diet - NAS, Cons CHO   nystatin cream (MYCOSTATIN) Apply 1 Application topically 2 (two) times daily as needed (vaginal itching or pain).   omeprazole (PRILOSEC) 40 MG capsule Take 1 capsule (40 mg total) by mouth in the morning and at bedtime.   Phenylephrine-Cocoa Butter 0.25-85.5 % SUPP Place 1 suppository rectally 2 (two) times daily.   polyethylene glycol (MIRALAX / GLYCOLAX) 17 g packet Take 17 g by mouth daily.   rOPINIRole (REQUIP) 1 MG tablet Take 1 mg by mouth at bedtime.   rosuvastatin (CRESTOR) 20 MG tablet Take 20 mg by mouth every evening.   tiZANidine (ZANAFLEX) 2 MG tablet Take 2 mg by mouth every 6 (six) hours as needed for muscle spasms (for back and sciatic pain).   zinc oxide 20 % ointment Apply 1 Application topically See admin instructions. Apply every shift to bilateral buttocks, sacrum, and coccyx   [DISCONTINUED] bisacodyl (DULCOLAX) 10 MG suppository Place 10 mg rectally every Wednesday. At 2pm   [DISCONTINUED] denosumab (PROLIA) 60 MG/ML SOSY injection Inject 60 mg into the skin every 6 (six) months.   [DISCONTINUED] Mouthwashes (BIOTENE DRY MOUTH MT) Use as directed 1 spray in the mouth or throat at bedtime.   [DISCONTINUED] Vitamin D, Ergocalciferol, (DRISDOL) 1.25 MG (50000 UNIT) CAPS capsule Take 50,000 Units by mouth every Monday.   No  facility-administered encounter medications on file as of 12/11/2022.     SIGNIFICANT DIAGNOSTIC EXAMS  PREVIOUS   04-15-22; dexa: t score -3.391  NO NEW EXAMS     LABS REVIEWED PREVIOUS   01-15-22: hgb a1c 6.7 chol 148; ldl 87; trig 112; hdl 39; tsh 5.663  02-11-22: urine culture: multiple bacteria  04-09-22: liver normal protein 6.0 albumin 3.3; chol 143; ldl 81; trig 71; hdl 48; tsh 3.502  04-27-22; wbc 6.6; hgb 8.7; hct 26.2; mcv 94.6 plt 231; glucose 152; bun 66; creat 1.26; k+ 4.7; na++ 132; ca 8.9; gfr 42; protein 6.7 albumin 3.4 hgb a1c 7.0; vitamin D 37.96 07-09-22: wbc 9.3; hgb 8.5; hct 26.9; mcv 96.8 plt 196; glucose 200; bun 65; creat 2.15; k+ 5.0; na++ 128; ca 7.8; gfr 22 07-10-22: glucose 130; bun 59; creat 1.75; k+ 4.2; na++ 131; ca 7.6; gfr 29 07-30-22: urine micro-albumin 59.7; ACR 127   08-20-22: hgb A1c 8.8 10-01-22: wbc 5.6; hgb 9.6; hct 29.9; mcv 94.9 plt 245; glucose 404; bun 43; creat 1.69; k+ 5.2; na++ 135; ca 8.2 gfr 30 protein 7.2 albumin 3.5 10-19-22: glucose 176; bun 45; creat 1.63; k+ 5.8; na++ 134; ca 8.9; gfr 31 10-31-22: wbc 4.6; hgb 8.4; hct 26.5; mcv 94.0 plt 183; glucose 93; bun 46; creat 1.64; k+ 5.2; na++ 134; ca 8.6; gfr 31; protein 6.5 albumin 3.2  11-05-22: glucose 117; bun 35; creat 1.25; k+ 4.3;na++ 137; ca 7.5; gfr 43; hgb A1c 8.5; vitamin D 26.12; chol 149; ldl 73 trig 166; hdl 43  NO NEW LABS.    Review of Systems  Constitutional:  Negative for malaise/fatigue.  Respiratory:  Negative for cough and shortness of breath.   Cardiovascular:  Negative for chest pain, palpitations and leg swelling.  Gastrointestinal:  Negative for abdominal pain, constipation and heartburn.  Musculoskeletal:  Negative for back pain,  joint pain and myalgias.  Skin: Negative.   Neurological:  Negative for dizziness.  Psychiatric/Behavioral:  The patient is not nervous/anxious.     Physical Exam Constitutional:      General: She is not in acute distress.     Appearance: She is well-developed. She is not diaphoretic.  Neck:     Thyroid: No thyromegaly.  Cardiovascular:     Rate and Rhythm: Normal rate and regular rhythm.     Pulses: Normal pulses.     Heart sounds: Normal heart sounds.  Pulmonary:     Effort: Pulmonary effort is normal. No respiratory distress.     Breath sounds: Normal breath sounds.  Abdominal:     General: Bowel sounds are normal. There is no distension.     Palpations: Abdomen is soft.     Tenderness: There is no abdominal tenderness.  Genitourinary:    Comments: foley Musculoskeletal:     Cervical back: Neck supple.     Right lower leg: No edema.     Left lower leg: No edema.     Comments: Does not move lower extremities   Lymphadenopathy:     Cervical: No cervical adenopathy.  Skin:    General: Skin is warm and dry.  Neurological:     Mental Status: She is alert and oriented to person, place, and time.  Psychiatric:        Mood and Affect: Mood normal.       ASSESSMENT/ PLAN:  TODAY  Hypertension associated with stage 3a chronic kidney disease due type 2 diabetes mellitus Aortic atherosclerosis Major depression chronic recurrent  Will continue current medications Will continue current plan of care Will continue to monitor her status   Time spent with patient: 40 minutes: medications; plan of care    Synthia Innocent NP [facility]  call [phone]

## 2022-12-21 ENCOUNTER — Ambulatory Visit: Payer: Medicare HMO | Admitting: Gastroenterology

## 2022-12-28 ENCOUNTER — Non-Acute Institutional Stay (SKILLED_NURSING_FACILITY): Payer: Medicare HMO | Admitting: Adult Health

## 2022-12-28 ENCOUNTER — Encounter: Payer: Self-pay | Admitting: Adult Health

## 2022-12-28 DIAGNOSIS — M5136 Other intervertebral disc degeneration, lumbar region: Secondary | ICD-10-CM | POA: Diagnosis not present

## 2022-12-28 DIAGNOSIS — K219 Gastro-esophageal reflux disease without esophagitis: Secondary | ICD-10-CM

## 2022-12-28 DIAGNOSIS — K5909 Other constipation: Secondary | ICD-10-CM | POA: Diagnosis not present

## 2022-12-28 DIAGNOSIS — M87052 Idiopathic aseptic necrosis of left femur: Secondary | ICD-10-CM

## 2022-12-28 DIAGNOSIS — Z66 Do not resuscitate: Secondary | ICD-10-CM

## 2022-12-28 DIAGNOSIS — M87051 Idiopathic aseptic necrosis of right femur: Secondary | ICD-10-CM | POA: Diagnosis not present

## 2022-12-28 DIAGNOSIS — E1142 Type 2 diabetes mellitus with diabetic polyneuropathy: Secondary | ICD-10-CM

## 2022-12-28 NOTE — Progress Notes (Unsigned)
Location:  [facility] Room Number: [address] Place of Service:  SNF (31)   CODE STATUS: DNR  Allergies  Allergen Reactions   Ace Inhibitors Other (See Comments)    Hyperkalemia    Codeine     Unknown reaction   Sulfa Antibiotics     Unknown reaction    Chief Complaint  Patient presents with   Medical Management of Chronic Issues    Routine visit    HPI:    Past Medical History:  Diagnosis Date   Adult failure to thrive    Anemia    Anxiety    Atherosclerosis of aorta (HCC)    Central cord syndrome at C4 level of cervical spinal cord, subsequent encounter (HCC)    CKD (chronic kidney disease)    stage 3   Depression    DM type 2 with diabetic peripheral neuropathy (HCC)    Dysphagia    GERD (gastroesophageal reflux disease)    Gout    High cholesterol    HTN (hypertension)    Hyponatremia    MDD (major depressive disorder)    Neck pain    Neuropathy    Quadriplegia, C1-C4 incomplete (HCC)    Radiculopathy    RLS (restless legs syndrome)    Urinary retention     Past Surgical History:  Procedure Laterality Date   ABDOMINAL HYSTERECTOMY     ANTERIOR CERVICAL DECOMP/DISCECTOMY FUSION N/A 02/05/2021   Procedure: Cervical Three-Four  Anterior cervical decompression/discectomy/fusion;  Surgeon: Coletta Memos, MD;  Location: [facility] OR;  Service: Neurosurgery;  Laterality: N/A;  RM 20   APPENDECTOMY     BACK SURGERY     BALLOON DILATION N/A 11/19/2022   Procedure: BALLOON DILATION;  Surgeon: Lanelle Bal, DO;  Location: [facility];  Service: Endoscopy;  Laterality: N/A;   BIOPSY  09/22/2021   Procedure: BIOPSY;  Surgeon: Lanelle Bal, DO;  [facility];  Service: Endoscopy;;   BIOPSY  11/19/2022   Procedure: BIOPSY;  Surgeon: Lanelle Bal, DO;  [facility];  Service: Endoscopy;;   CERVICAL DISC SURGERY     ESOPHAGOGASTRODUODENOSCOPY (EGD) WITH PROPOFOL N/A 09/22/2021   Procedure: ESOPHAGOGASTRODUODENOSCOPY  (EGD) WITH PROPOFOL;  Surgeon: Lanelle Bal, DO;  [facility];  Service: Endoscopy;  Laterality: N/A;  1:00pm   ESOPHAGOGASTRODUODENOSCOPY (EGD) WITH PROPOFOL N/A 11/19/2022   Procedure: ESOPHAGOGASTRODUODENOSCOPY (EGD) WITH PROPOFOL;  Surgeon: Lanelle Bal, DO;  [facility];  Service: Endoscopy;  Laterality: N/A;  11:30 am, asa 3   HAND SURGERY     KNEE SURGERY      Social History   Socioeconomic History   Marital status: Widowed    Spouse name: Not on file   Number of children: Not on file   Years of education: Not on file   Highest education level: Not on file  Occupational History   Not on file  Tobacco Use   Smoking status: Never   Smokeless tobacco: Never  Vaping Use   Vaping Use: Never used  Substance and Sexual Activity   Alcohol use: Never   Drug use: Never   Sexual activity: Not Currently  Other Topics Concern   Not on file  Social History Narrative   Lives at [facility]   Social Determinants of Health   Financial Resource Strain: Not on file  Food Insecurity: Not on file  Transportation Needs: Not on file  Physical Activity: Not on file  Stress: Not on file  Social Connections: Not on file  Intimate Partner Violence: Not on file   Family History  Problem Relation Age of Onset   Cancer Mother    Cardiomyopathy Father    Seizures Sister        childhood   Seizures Brother        in his 21s   Colon cancer Neg Hx       VITAL SIGNS BP 130/73   Pulse 88   Temp 98.4 F (36.9 C) (Temporal)   Resp 20   Ht 5\' 2"  (1.575 m)   Wt 173 lb (78.5 kg)   SpO2 97%   BMI 31.64 kg/m   Outpatient Encounter Medications as of 12/28/2022  Medication Sig   acetaminophen (TYLENOL) 325 MG tablet Take 650 mg by mouth every 8 (eight) hours.   AMBULATORY NON FORMULARY MEDICATION Medication Name:  Continue monthly catheter changes with 47fr catheter at skilled nursing facility.   amLODipine (NORVASC) 5 MG tablet Take 5 mg by  mouth daily.   aspirin EC 81 MG tablet Take 81 mg by mouth daily. Swallow whole.9 am   augmented betamethasone dipropionate (DIPROLENE-AF) 0.05 % ointment Apply 1 Application topically 2 (two) times daily as needed. Apply to stomach and both breast   calcium-vitamin D (OSCAL WITH D) 500-5 MG-MCG tablet Take 1 tablet by mouth daily.   camphor-menthol (SARNA) lotion Apply 1 Application topically 2 (two) times daily as needed for itching.   dapagliflozin propanediol (FARXIGA) 5 MG TABS tablet Take 5 mg by mouth daily.   DULoxetine (CYMBALTA) 20 MG capsule Take 20 mg by mouth daily.   fexofenadine (ALLEGRA) 180 MG tablet Take 180 mg by mouth daily.   gabapentin (NEURONTIN) 300 MG capsule Take 300 mg by mouth 3 (three) times daily.   insulin aspart (NOVOLOG FLEXPEN) 100 UNIT/ML FlexPen Inject 5 Units into the skin 3 (three) times daily with meals. 8 am, 12 pm, and 6 pm   Insulin Pen Needle 30G X 5 MM MISC 1 Device by Does not apply route daily. 3/16"   ipratropium-albuterol (DUONEB) 0.5-2.5 (3) MG/3ML SOLN Take 3 mLs by nebulization every 6 (six) hours as needed (wheezing).   LANTUS SOLOSTAR 100 UNIT/ML Solostar Pen Inject 25 Units into the skin at bedtime.   levETIRAcetam (KEPPRA) 1000 MG tablet Take 1,000 mg by mouth 2 (two) times daily.   levETIRAcetam (KEPPRA) 500 MG tablet Take 500 mg by mouth at bedtime. Along with 1000 mg for a total on 1500 mg in the pm (see other listing)   lidocaine (LIDODERM) 5 % Place 1 patch onto the skin 2 (two) times daily as needed (pain). Apply to Right shoulder as needed once a morning and remove at bedtime   linaclotide (LINZESS) 72 MCG capsule Take 1 capsule (72 mcg total) by mouth daily before breakfast.   LORazepam (ATIVAN) 2 MG/ML injection Inject 0.5 mLs (1 mg total) into the muscle every 15 (fifteen) minutes as needed.   melatonin 3 MG TABS tablet Take 3 mg by mouth at bedtime.   Multiple Vitamins-Minerals (THEREMS M PO) Take 1 tablet by mouth daily.  (multivitamin with folic acid) tablet; 400 mcg;   NON FORMULARY Diet - Regular   nystatin cream (MYCOSTATIN) Apply 1 Application topically 2 (two) times daily as needed (vaginal itching or pain).   omeprazole (PRILOSEC) 40 MG capsule Take 1 capsule (40 mg total) by mouth in the morning and at bedtime.   Phenylephrine-Cocoa Butter 0.25-85.5 % SUPP  Place 1 suppository rectally 2 (two) times daily.   polyethylene glycol (MIRALAX / GLYCOLAX) 17 g packet Take 17 g by mouth daily.   rOPINIRole (REQUIP) 1 MG tablet Take 1 mg by mouth at bedtime.   rosuvastatin (CRESTOR) 20 MG tablet Take 20 mg by mouth every evening.   tiZANidine (ZANAFLEX) 2 MG tablet Take 2 mg by mouth every 6 (six) hours as needed for muscle spasms (for back and sciatic pain).   zinc oxide 20 % ointment Apply 1 Application topically See admin instructions. Apply every shift to bilateral buttocks, sacrum, and coccyx   [DISCONTINUED] calcium carbonate (TUMS EX) 750 MG chewable tablet Chew 1 tablet by mouth 3 (three) times daily.   No facility-administered encounter medications on file as of 12/28/2022.     SIGNIFICANT DIAGNOSTIC EXAMS       ASSESSMENT/ PLAN:     Synthia Innocent NP [facility]  Contact [phone] Monday through Friday 8am- 5pm  After hours call [phone]

## 2022-12-29 NOTE — Progress Notes (Signed)
GI Office Note    Referring Provider: Sharee Holster, NP Primary Care Physician:  Sharee Holster, NP Primary Gastroenterologist: Hennie Duos. Marletta Lor, DO  Date:  12/30/2022  ID:  [name], DOB 24-Dec-1935, MRN [id number]   Chief Complaint   Chief Complaint  Patient presents with   Follow-up    Follow up abd pain   History of Present Illness  [name] is a 87 y.o. female with a history of anemia, diabetes, HTN, HLD, central cord syndrome at C4 secondary to fall in May 2022 presenting today for follow up up abdominal pain, constipation, and dysphagia.   EGD 09/22/21: -gastritis s/p biopsy -normal duodenum -omeprazole 40 mg daily -Reglan 5 mg QID   Two ED visits recently for constipation and abdominal pain earlier this month. CT as outlined below. Treated with soap suds enema one instance. No changes to bowel regimen made, continue metamucil.   CT A/P 10/01/22: -mild fullness in right renal collecting system -changes consistent with rectal impaction   LFTs normal. Hgb 9.6, Glucose 404 on 10/01/22.   Last office visit 10/20/22. Seen for rectal pain and abdominal pain.  Abdominal pain reported as being sharp.  Daughter reported issues since she had a fall.  Starts in the left and radiates to her left upper abdomen.  Has some nausea but no vomiting.  Reports some esophageal burning and mild dysphagia due to issues swallowing.  Patient was unsure of her frequency of bowel movements.  States poor appetite due to pain in all the likely change in food choices at SNF.  Also reported bloating and belching.  Having intermittent rectal pain.  History of spinal cord injury since fall.  Added Linzess 72 mcg daily, continue MiraLAX daily.  Advised suppository once weekly given CT evidence of infection.  Scheduled for EGD with dilation per patient's request to evaluate her swallowing and upper abdominal pain.  Omeprazole increased to 40 mg twice daily from 20 mg twice daily.   EGD  11/19/22: -Gastritis s/p biopsy -Normal duodenum -No esophageal stenosis or stricture -Biopsies negative for H. Pylori, hyperplasia seen consistent with PPI therapy.  -Advised PPI twice daily  Today: Abdominal pain - possibly a little better but about the same per patient. Unsure how often it occurs. Daughter reports she is complaining less often about her pain. States she does not eat as much as she used to but also does not like food choices - especially on the weekends. . She does get a good breakfast. Gets mostly sandwiches/salads for lunch/dinners on the weekends.  Constipation - unsure about how often she is going. LLQ is still having some tenderness. States she at times has some rectal discomfort.  Unsure of any melena or BRBPR.  Daughter states no reports of rectal bleeding. Per medication record from facility she gets rectal suppositories regularly.   GERD, dysphagia - would like to take a multivitamin to see if this can help with her appetite. Daughter reports if they can bring her food from outside she will usually eat it.  Dysphagia improved.  Denies any nausea or vomiting. She will usually drink an ensure if she does not really like what she is given for lunch/dinner. She also reports difficulty in chewing given her dentition.    Current Outpatient Medications  Medication Sig Dispense Refill   acetaminophen (TYLENOL) 325 MG tablet Take 650 mg by mouth every 8 (eight) hours.     AMBULATORY NON FORMULARY MEDICATION Medication Name:  Continue monthly catheter  changes with 40fr catheter at skilled nursing facility. 1 application MONTHLY   amLODipine (NORVASC) 5 MG tablet Take 5 mg by mouth daily.     aspirin EC 81 MG tablet Take 81 mg by mouth daily. Swallow whole.9 am     augmented betamethasone dipropionate (DIPROLENE-AF) 0.05 % ointment Apply 1 Application topically 2 (two) times daily as needed. Apply to stomach and both breast     calcium-vitamin D (OSCAL WITH D) 500-5 MG-MCG  tablet Take 1 tablet by mouth daily.     camphor-menthol (SARNA) lotion Apply 1 Application topically 2 (two) times daily as needed for itching.     dapagliflozin propanediol (FARXIGA) 5 MG TABS tablet Take 5 mg by mouth daily.     DULoxetine (CYMBALTA) 20 MG capsule Take 20 mg by mouth daily.     fexofenadine (ALLEGRA) 180 MG tablet Take 180 mg by mouth daily.     gabapentin (NEURONTIN) 300 MG capsule Take 300 mg by mouth 3 (three) times daily.     insulin aspart (NOVOLOG FLEXPEN) 100 UNIT/ML FlexPen Inject 5 Units into the skin 3 (three) times daily with meals. 8 am, 12 pm, and 6 pm     Insulin Pen Needle 30G X 5 MM MISC 1 Device by Does not apply route daily. 3/16"  0   ipratropium-albuterol (DUONEB) 0.5-2.5 (3) MG/3ML SOLN Take 3 mLs by nebulization every 6 (six) hours as needed (wheezing).     LANTUS SOLOSTAR 100 UNIT/ML Solostar Pen Inject 25 Units into the skin at bedtime.     levETIRAcetam (KEPPRA) 1000 MG tablet Take 1,000 mg by mouth 2 (two) times daily.     levETIRAcetam (KEPPRA) 500 MG tablet Take 500 mg by mouth at bedtime. Along with 1000 mg for a total on 1500 mg in the pm (see other listing)     lidocaine (LIDODERM) 5 % Place 1 patch onto the skin 2 (two) times daily as needed (pain). Apply to Right shoulder as needed once a morning and remove at bedtime     linaclotide (LINZESS) 72 MCG capsule Take 1 capsule (72 mcg total) by mouth daily before breakfast. 30 capsule 2   LORazepam (ATIVAN) 2 MG/ML injection Inject 0.5 mLs (1 mg total) into the muscle every 15 (fifteen) minutes as needed. 4 mL 0   melatonin 3 MG TABS tablet Take 3 mg by mouth at bedtime.     Multiple Vitamins-Minerals (THEREMS M PO) Take 1 tablet by mouth daily. (multivitamin with folic acid) tablet; 400 mcg;     NON FORMULARY Diet - Regular     nystatin cream (MYCOSTATIN) Apply 1 Application topically 2 (two) times daily as needed (vaginal itching or pain).     omeprazole (PRILOSEC) 40 MG capsule Take 1 capsule (40  mg total) by mouth in the morning and at bedtime. 90 capsule 3   Phenylephrine-Cocoa Butter 0.25-85.5 % SUPP Place 1 suppository rectally 2 (two) times daily.     polyethylene glycol (MIRALAX / GLYCOLAX) 17 g packet Take 17 g by mouth daily.     rOPINIRole (REQUIP) 1 MG tablet Take 1 mg by mouth at bedtime.     rosuvastatin (CRESTOR) 20 MG tablet Take 20 mg by mouth every evening.     tiZANidine (ZANAFLEX) 2 MG tablet Take 2 mg by mouth every 6 (six) hours as needed for muscle spasms (for back and sciatic pain).     zinc oxide 20 % ointment Apply 1 Application topically See admin instructions. Apply every shift  to bilateral buttocks, sacrum, and coccyx     No current facility-administered medications for this visit.    Past Medical History:  Diagnosis Date   Adult failure to thrive    Anemia    Anxiety    Atherosclerosis of aorta (HCC)    Central cord syndrome at C4 level of cervical spinal cord, subsequent encounter (HCC)    CKD (chronic kidney disease)    stage 3   Depression    DM type 2 with diabetic peripheral neuropathy (HCC)    Dysphagia    GERD (gastroesophageal reflux disease)    Gout    High cholesterol    HTN (hypertension)    Hyponatremia    MDD (major depressive disorder)    Neck pain    Neuropathy    Quadriplegia, C1-C4 incomplete (HCC)    Radiculopathy    RLS (restless legs syndrome)    Urinary retention     Past Surgical History:  Procedure Laterality Date   ABDOMINAL HYSTERECTOMY     ANTERIOR CERVICAL DECOMP/DISCECTOMY FUSION N/A 02/05/2021   Procedure: Cervical Three-Four  Anterior cervical decompression/discectomy/fusion;  Surgeon: Coletta Memos, MD;  Location: [facility] OR;  Service: Neurosurgery;  Laterality: N/A;  RM 20   APPENDECTOMY     BACK SURGERY     BALLOON DILATION N/A 11/19/2022   Procedure: BALLOON DILATION;  Surgeon: Lanelle Bal, DO;  [facility];  Service: Endoscopy;  Laterality: N/A;   BIOPSY  09/22/2021   Procedure: BIOPSY;   Surgeon: Lanelle Bal, DO;  [facility];  Service: Endoscopy;;   BIOPSY  11/19/2022   Procedure: BIOPSY;  Surgeon: Lanelle Bal, DO;  [facility];  Service: Endoscopy;;   CERVICAL DISC SURGERY     ESOPHAGOGASTRODUODENOSCOPY (EGD) WITH PROPOFOL N/A 09/22/2021   Procedure: ESOPHAGOGASTRODUODENOSCOPY (EGD) WITH PROPOFOL;  Surgeon: Lanelle Bal, DO;  [facility];  Service: Endoscopy;  Laterality: N/A;  1:00pm   ESOPHAGOGASTRODUODENOSCOPY (EGD) WITH PROPOFOL N/A 11/19/2022   Procedure: ESOPHAGOGASTRODUODENOSCOPY (EGD) WITH PROPOFOL;  Surgeon: Lanelle Bal, DO;  [facility];  Service: Endoscopy;  Laterality: N/A;  11:30 am, asa 3   HAND SURGERY     KNEE SURGERY      Family History  Problem Relation Age of Onset   Cancer Mother    Cardiomyopathy Father    Seizures Sister        childhood   Seizures Brother        in his 37s   Colon cancer Neg Hx     Allergies as of 12/30/2022 - Review Complete 12/30/2022  Allergen Reaction Noted   Ace inhibitors Other (See Comments) 08/29/2021   Codeine  03/09/2018   Sulfa antibiotics  03/25/2021    Social History   Socioeconomic History   Marital status: Widowed    Spouse name: Not on file   Number of children: Not on file   Years of education: Not on file   Highest education level: Not on file  Occupational History   Not on file  Tobacco Use   Smoking status: Never   Smokeless tobacco: Never  Vaping Use   Vaping Use: Never used  Substance and Sexual Activity   Alcohol use: Never   Drug use: Never   Sexual activity: Not Currently  Other Topics Concern   Not on file  Social History Narrative   Lives at [facility]   Social Determinants of Health   Financial Resource  Strain: Not on file  Food Insecurity: Not on file  Transportation Needs: Not on file  Physical Activity: Not on file  Stress: Not on file  Social Connections: Not on file     Review of  Systems   Gen: Denies fever, chills, anorexia. Denies fatigue, weakness, weight loss.  CV: Denies chest pain, palpitations, syncope, peripheral edema, and claudication. Resp: Denies dyspnea at rest, cough, wheezing, coughing up blood, and pleurisy. GI: See HPI Derm: Denies rash, itching, dry skin Psych: Denies depression, anxiety, memory loss, confusion. No homicidal or suicidal ideation.  Heme: Denies bruising, bleeding, and enlarged lymph nodes.   Physical Exam   BP 96/60   Pulse 61   Temp 97.6 F (36.4 C)   Ht 5\' 2"  (1.575 m)   BMI 31.64 kg/m   General:   Alert. No distress noted. Pleasant and cooperative.  Head:  Normocephalic and atraumatic. Eyes:  Conjuctiva clear without scleral icterus. Mouth:  Oral mucosa pink and moist. Good dentition. No lesions. Lungs:  Clear to auscultation bilaterally.  Mild inspiratory wheezing. Heart:  S1, S2 present without murmurs appreciated.  Abdomen:  +BS, soft,  non-distended. Mild ttp to LLQ. No rebound or guarding. No HSM or masses noted. Rectal: Deferred Msk:  Symmetrical without gross deformities. Normal posture. Extremities: No movement, mild sensation.  No swelling Neurologic:  Alert and  oriented x3.  No motor movement from waist down.  Mild sensation from waist down. Psych:  Alert and cooperative. Normal mood and affect.   Assessment  DEVON LOVEN is a 87 y.o. female with a history of anemia, diabetes, HTN, HLD, central cord syndrome at C4 secondary to fall in May 2022 presenting today for follow up up abdominal pain, constipation, and dysphagia.    Constipation: Unsure about how often she is having Bms. Still complaining of some LLQ tenderness. She is getting Linzess 72 mcg daily and miralax daily at facility. Will increase miralax to BID and advised to continue suppository once weekly given spinal cord injury. Will obtain KUB to assess for ongoing constipation as cause of abdominal pain. Will adjust bowel regimen accordingly.     GERD, dysphagia, left side abdominal pain, poor appetite: Patient reported dysphagia improved since EGD. Suspect some of her lack of appetite is secondary to food choices available as well as poor dentition. Daughter states that many times if they bring her food that she will eat. Usually eats well at breakfast. Unclear if weight loss present as facility has not weighed patient regularly. Continued on PPI BID. Recommended to continue multivitamin daily. Advised boost/ensure 1-2 daily pending intake of meals at facility. Need to monitor weight frequently. Encouraged facility to monitor weekly.   PLAN   Omeprazole 40 mg BID Hemorrhoid cream KUB May increase Linzess pending KUB Start miralax BID Multivitamin daily Offer Ensure 1-2 times per day.  Frequent monitoring of weight. Follow up in 2 months.     Brooke Bonito, MSN, FNP-BC, AGACNP-BC [facility]

## 2022-12-30 ENCOUNTER — Ambulatory Visit (HOSPITAL_COMMUNITY)
Admission: RE | Admit: 2022-12-30 | Discharge: 2022-12-30 | Disposition: A | Discharge status: Home / Self Care | Payer: Medicare HMO | Source: Ambulatory Visit | Attending: Gastroenterology | Admitting: Gastroenterology

## 2022-12-30 ENCOUNTER — Ambulatory Visit (INDEPENDENT_AMBULATORY_CARE_PROVIDER_SITE_OTHER): Payer: Medicare HMO | Admitting: Gastroenterology

## 2022-12-30 ENCOUNTER — Encounter: Payer: Self-pay | Admitting: Gastroenterology

## 2022-12-30 VITALS — BP 96/60 | HR 61 | Temp 97.6°F | Ht 62.0 in

## 2022-12-30 DIAGNOSIS — R630 Anorexia: Secondary | ICD-10-CM

## 2022-12-30 DIAGNOSIS — D649 Anemia, unspecified: Secondary | ICD-10-CM | POA: Diagnosis not present

## 2022-12-30 DIAGNOSIS — K5900 Constipation, unspecified: Secondary | ICD-10-CM

## 2022-12-30 DIAGNOSIS — K219 Gastro-esophageal reflux disease without esophagitis: Secondary | ICD-10-CM | POA: Diagnosis not present

## 2022-12-30 DIAGNOSIS — R109 Unspecified abdominal pain: Secondary | ICD-10-CM

## 2022-12-30 NOTE — Patient Instructions (Signed)
Will obtain an abdominal x-ray to assess for level of constipation.  Continue use hemorrhoid cream/suppositories once/twice daily as needed  Continue omeprazole 40 mg twice daily.  Continue at least 1 Ensure daily.  On the weekends please offer an additional Ensure if no interest in eating lunch or dinner.  Continue multivitamin daily.  Increase MiraLAX to twice daily.  For now continue Linzess 72 mcg daily.  Pending results of KUB we may increase dose.  Plan to follow-up in 2 months.  It was a pleasure to see you today. I want to create trusting relationships with patients. If you receive a survey regarding your visit,  I greatly appreciate you taking time to fill this out on paper or through your MyChart. I value your feedback.  Brooke Bonito, MSN, FNP-BC, AGACNP-BC [facility]

## 2023-01-14 IMAGING — MR MR LUMBAR SPINE W/O CM
5 of 10 series · 25 of 48 positions shown · non-contrast
Comparison: 01/10/2008

CLINICAL DATA: Low back and left hip pain.  Multiple falls.

EXAM:
MRI LUMBAR SPINE WITHOUT CONTRAST
TECHNIQUE: Multiplanar, multisequence MR imaging of the lumbar spine was
performed. No intravenous contrast was administered.

[Series 5: T2 · sagittal · 4.0mm · 0.81mm/px · 3 of 16 slices shown (1 of 2)]
[im 1/16]
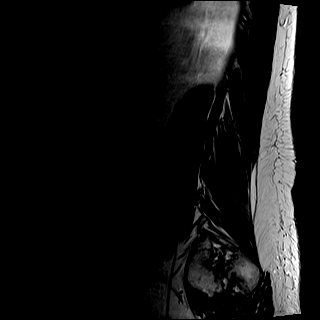
[im 8/16]
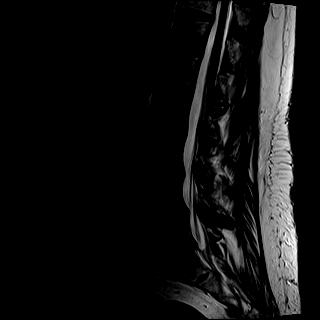
[im 16/16]
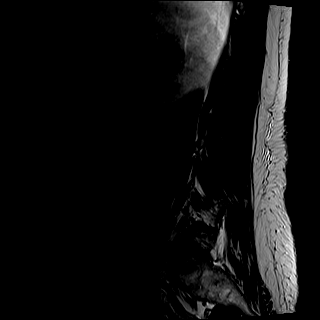

[Series 7: T1 · sagittal · 4.0mm · 1.02mm/px · 3 of 16 slices shown (1 of 3)]
[im 1/16]
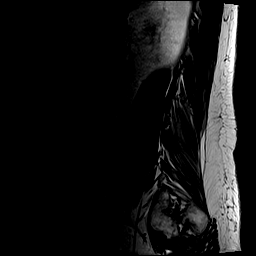
[im 8/16]
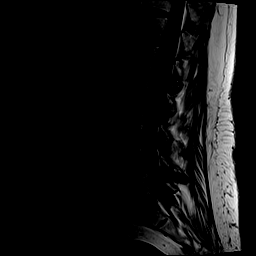
[im 16/16]
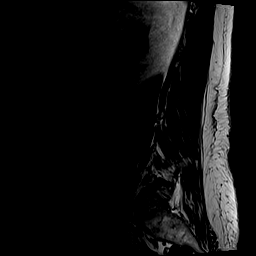

[Series 8: T2 · axial · 4.0mm · 0.70mm/px · z∈[-236,-22]mm · 7 of 37 slices shown (2 of 2)]
[im 1/37]
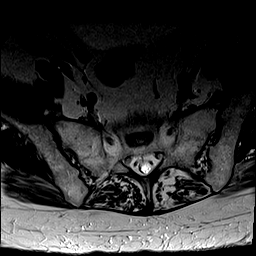
[im 7/37]
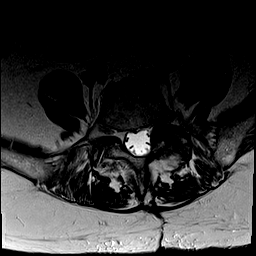
[im 13/37]
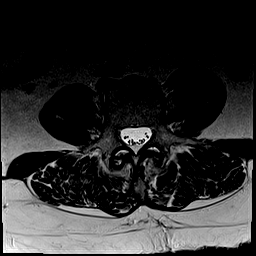
[im 19/37]
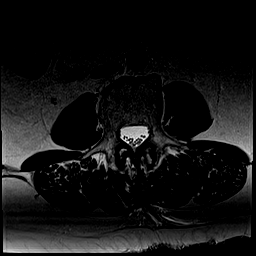
[im 25/37]
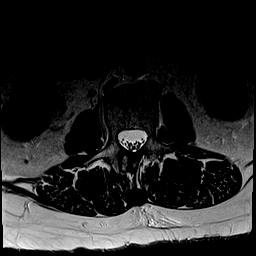
[im 31/37]
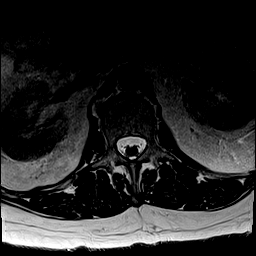
[im 37/37]
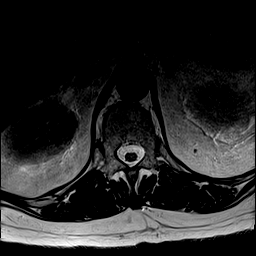

[Series 9: T1 · axial · 4.0mm · 0.35mm/px · z∈[-236,-22]mm · 7 of 37 slices shown (2 of 3)]
[im 1/37]
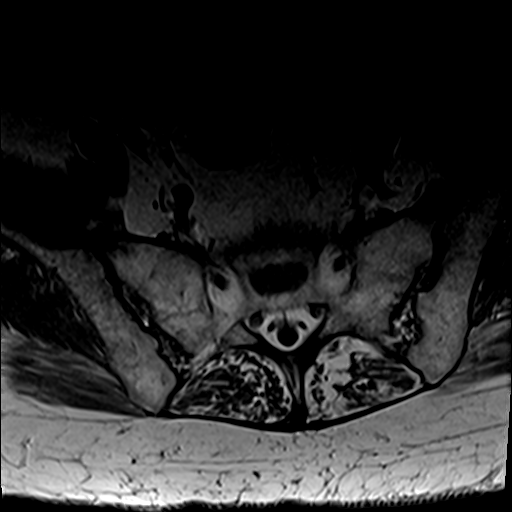
[im 7/37]
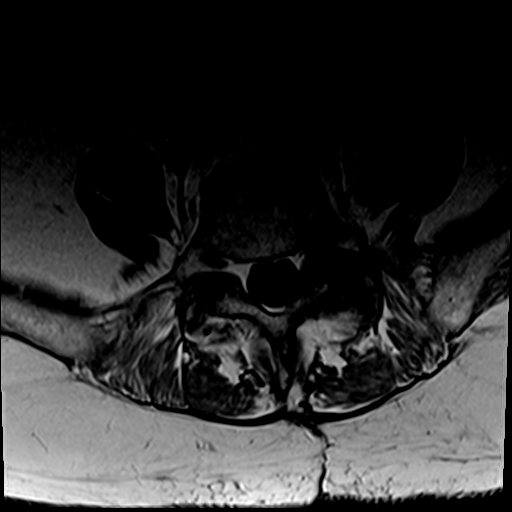
[im 13/37]
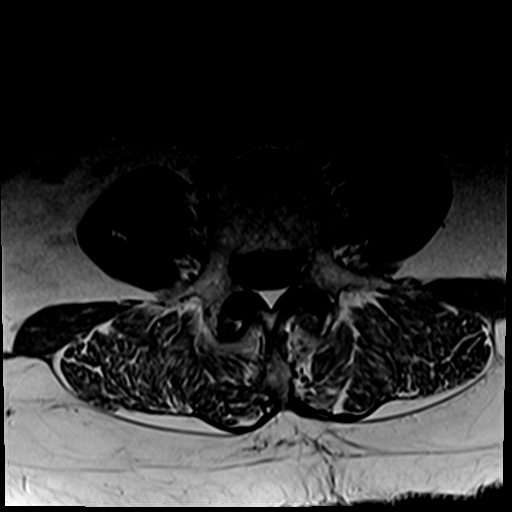
[im 19/37]
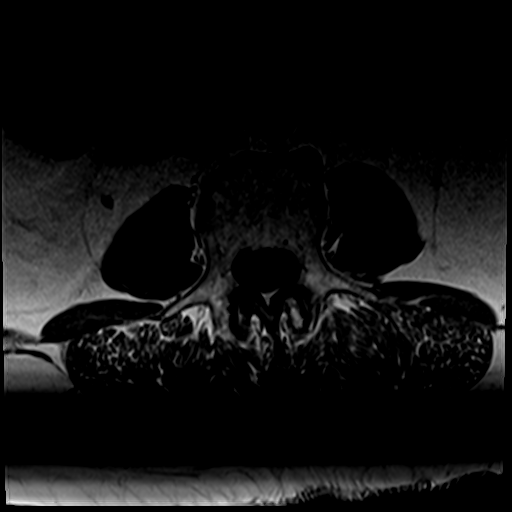
[im 25/37]
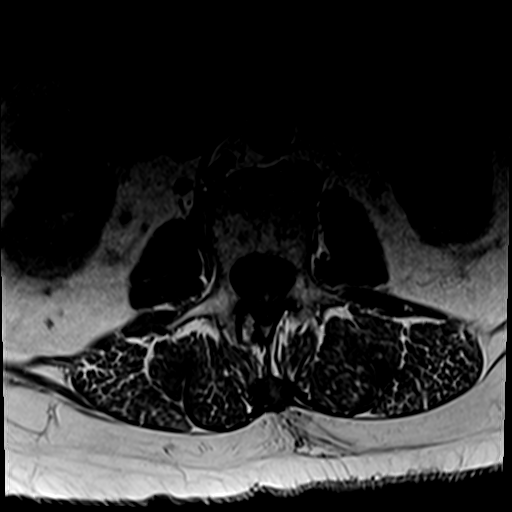
[im 31/37]
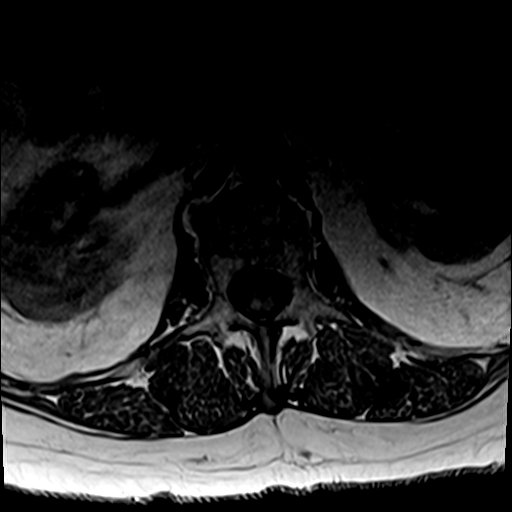
[im 37/37]
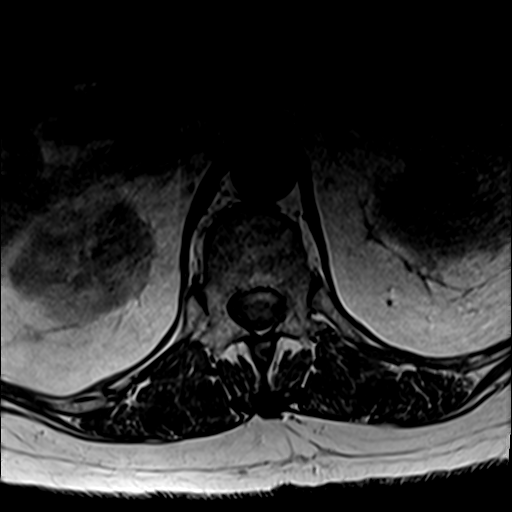

[Series 14: T1 · coronal · 4.0mm · 0.74mm/px · 5 of 30 slices shown (3 of 3)]
[im 1/30]
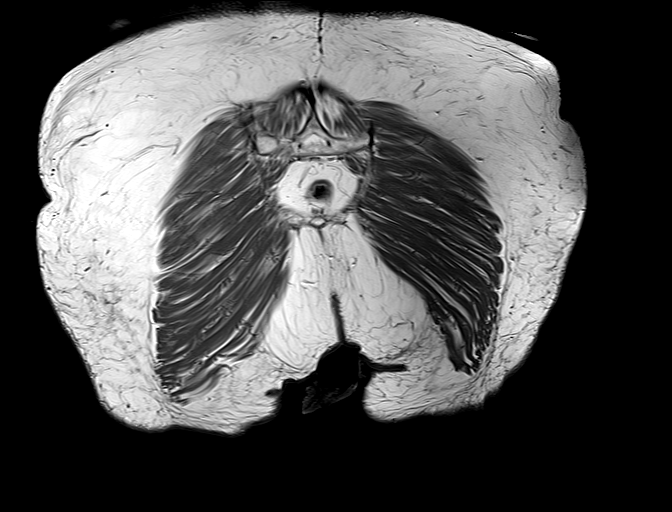
[im 8/30]
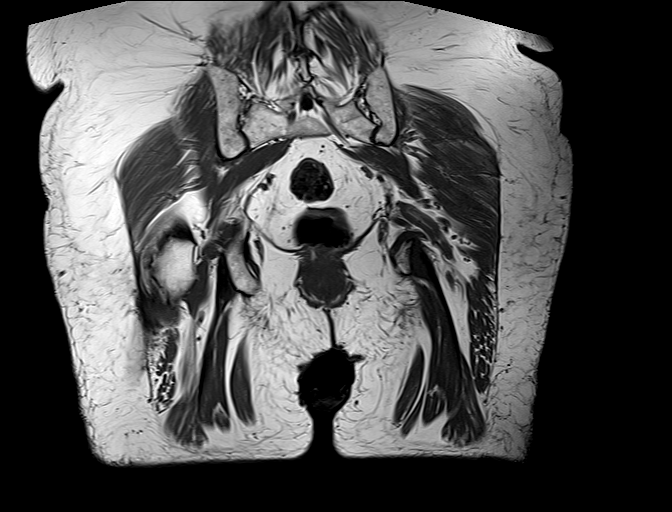
[im 15/30]
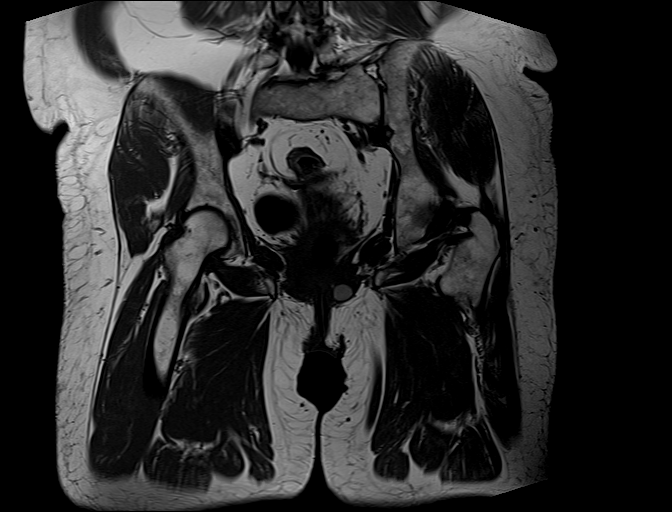
[im 22/30]
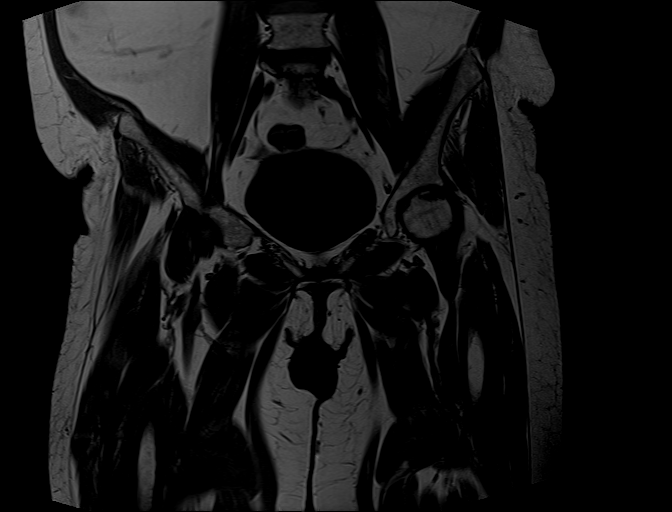
[im 30/30]
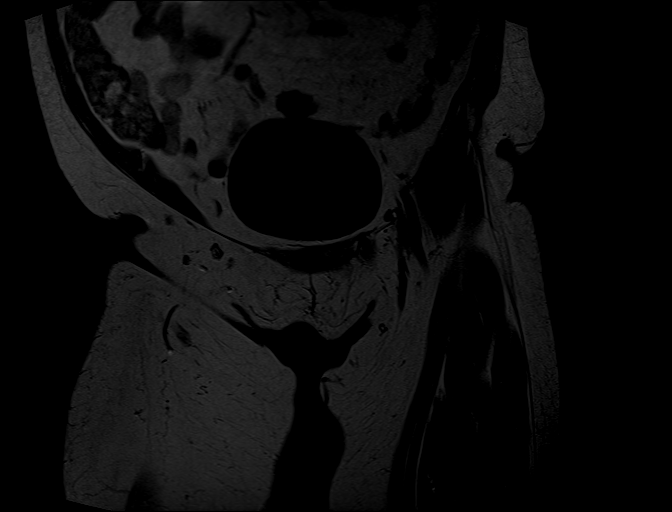

[25 of 48 positions shown; findings below may reference images not displayed]

FINDINGS: Segmentation:  5 lumbar type vertebral bodies.

Alignment:  2 mm degenerative anterolisthesis L4-5.

Vertebrae:  No fracture or primary bone lesion.

Conus medullaris and cauda equina: Conus extends to the L1-2 level.
Conus and cauda equina appear normal.

Paraspinal and other soft tissues: Negative

Disc levels:

No disc abnormality at L2-3 or above. At L2-3, patient has mild to
moderate bilateral facet osteoarthritis. At L3-4 there is mild
bulging of the disc in both posterolateral directions, more
prominent on the left, with mild left foraminal encroachment that
could possibly affect the left L3 nerve. There is moderate to severe
bilateral facet osteoarthritis. These findings could relate to back
pain or referred facet syndrome pain.

L4-5: Bilateral facet arthropathy with 2 mm of anterolisthesis.
Desiccation and bulging of the disc more prominent towards the
right. Mild narrowing of both lateral recesses and of the
intervertebral foramen on right, but without visible neural
compression. Findings at this level could relate to back pain or
referred facet syndrome.

L5-S1: Previous left hemilaminectomy. Desiccation of the disc with
loss of height. Endplate osteophytes and bulging of the disc
slightly more prominent towards the left. Mild facet osteoarthritis
on the left. Moderate facet degeneration and hypertrophy on the
right. Left foraminal stenosis and subarticular lateral recess
stenosis could cause left-sided neural compression.
IMPRESSION: 1. L2-3: Mild to moderate bilateral facet osteoarthritis.
2. L3-4: Bilateral posterolateral disc bulges, more prominent on the
left. Mild left foraminal encroachment that could possibly affect
the left L3 nerve. Moderate to severe bilateral facet
osteoarthritis. These findings could relate to back pain or referred
facet syndrome pain.
3. L4-5: Bilateral facet arthropathy with 2 mm of anterolisthesis.
Bulging of the disc more prominent towards the right. Mild narrowing
of the lateral recesses and of the intervertebral foramen on the
right, but without visible neural compression. Findings at this
level could relate to back pain or referred facet syndrome pain.
4. L5-S1: Previous left hemilaminectomy. Endplate osteophytes and
bulging of the disc more prominent towards the left. Facet
degeneration and hypertrophy left more than right with left
foraminal stenosis and subarticular lateral recess stenosis could
cause left-sided neural compression.

## 2023-01-14 IMAGING — CT CT HEAD W/O CM
4 series · 17 of 47 positions shown, 19 images · non-contrast
Comparison: 08/20/2019

CLINICAL DATA: Fall

EXAM:
CT HEAD WITHOUT CONTRAST
TECHNIQUE: Contiguous axial images were obtained from the base of the skull
through the vertex without intravenous contrast.

[Series 2: head w o · axial · 0.49mm/px · z∈[-1,+114]mm · 7 of 31 slices shown, 9 images]
[im 4/31  brain]
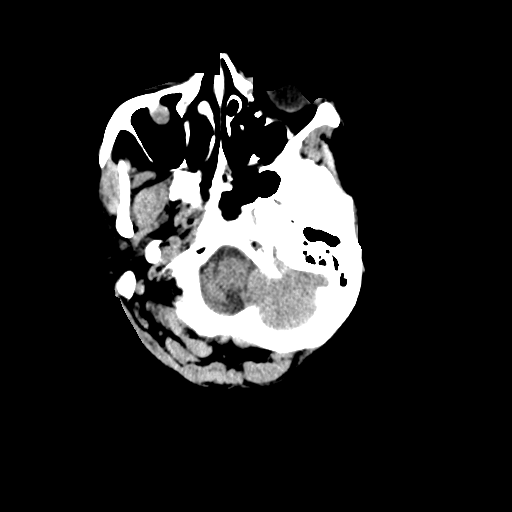
[im 4/31  bone]
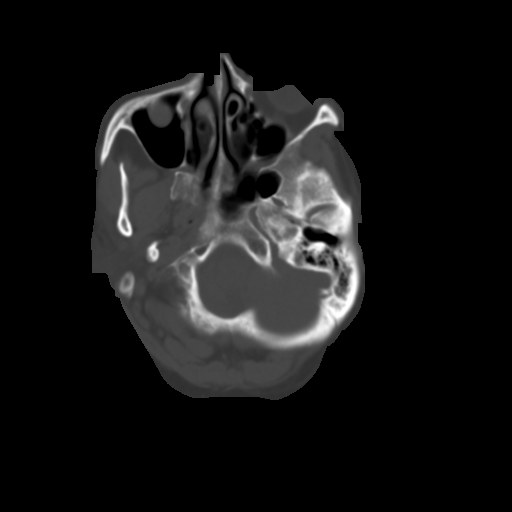
[im 8/31  brain]
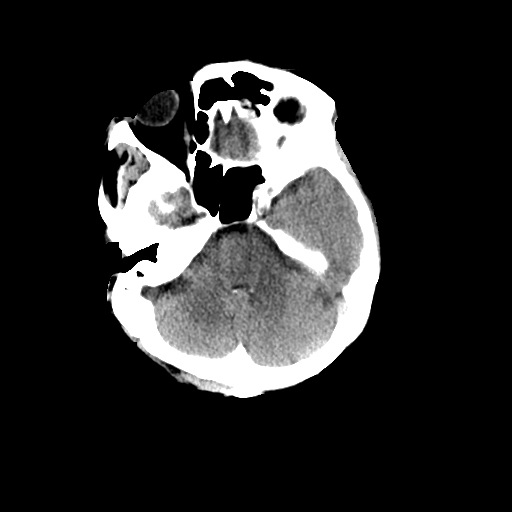
[im 12/31  brain]
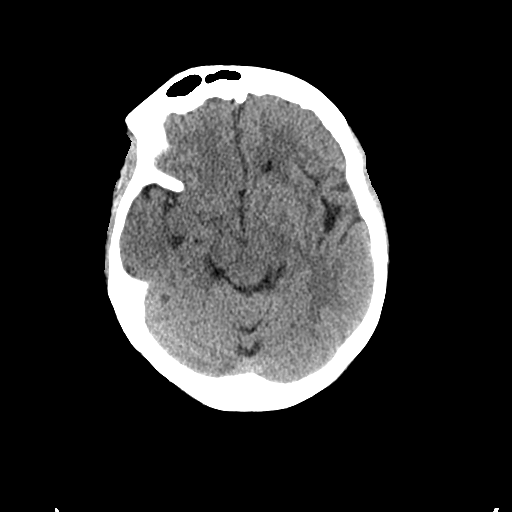
[im 16/31  brain]
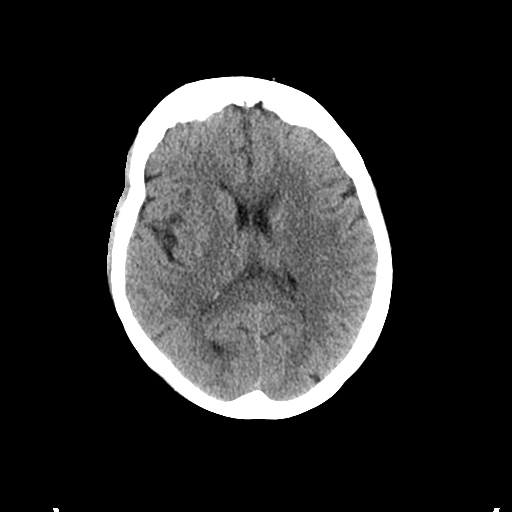
[im 19/31  brain]
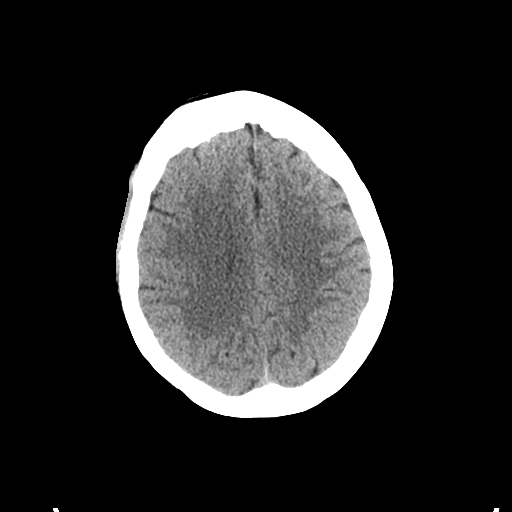
[im 19/31  bone]
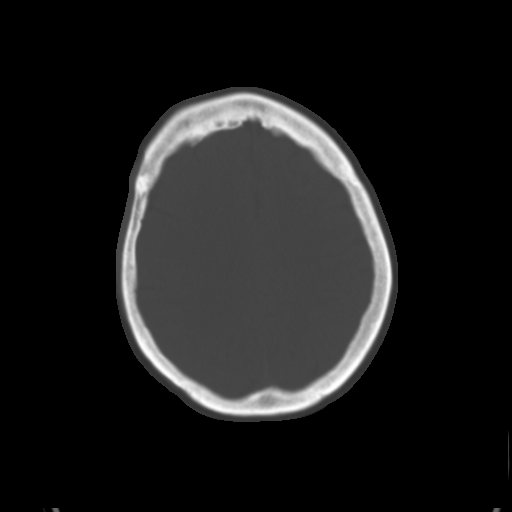
[im 23/31  brain]
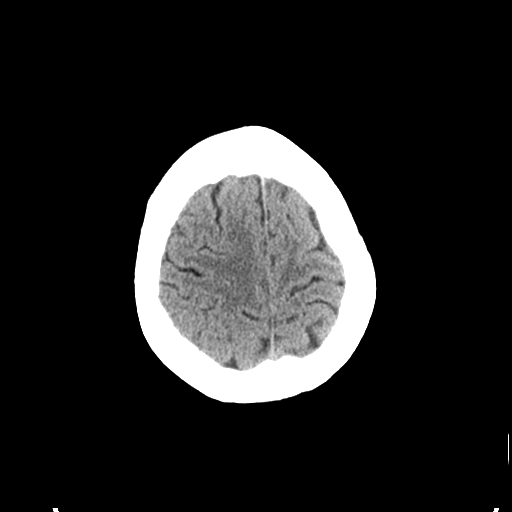
[im 27/31  brain]
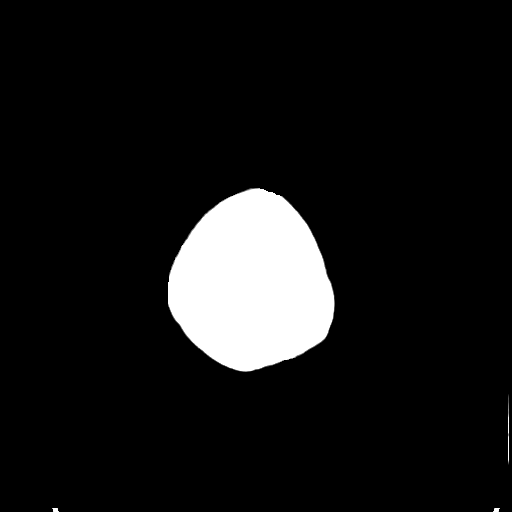

[Series 3: head bone · axial · 0.49mm/px · z∈[-2,+52]mm · 4 of 77 slices shown]
[im 8/77  bone]
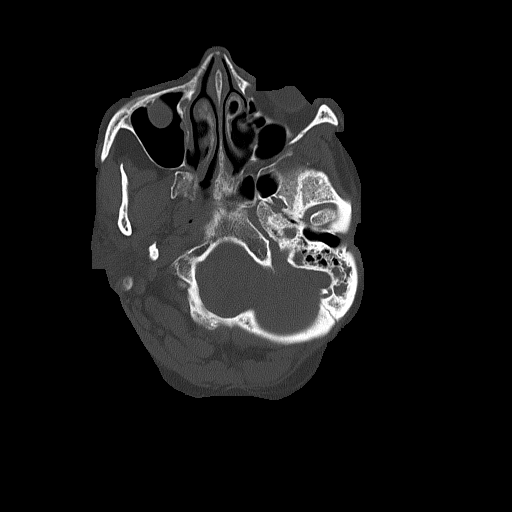
[im 16/77  bone]
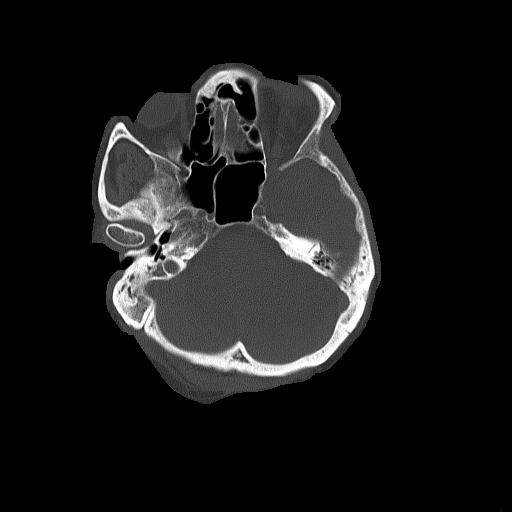
[im 23/77  bone]
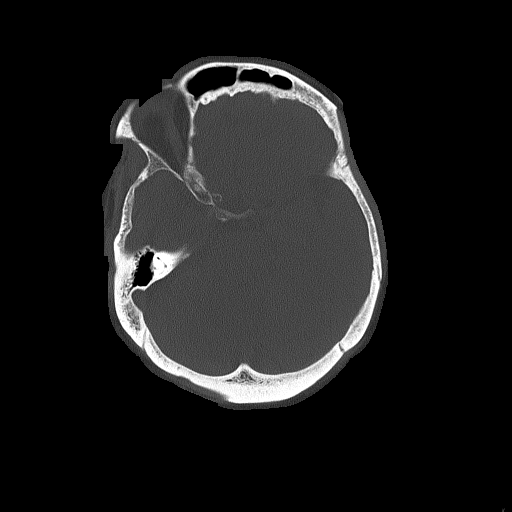
[im 35/77  bone]
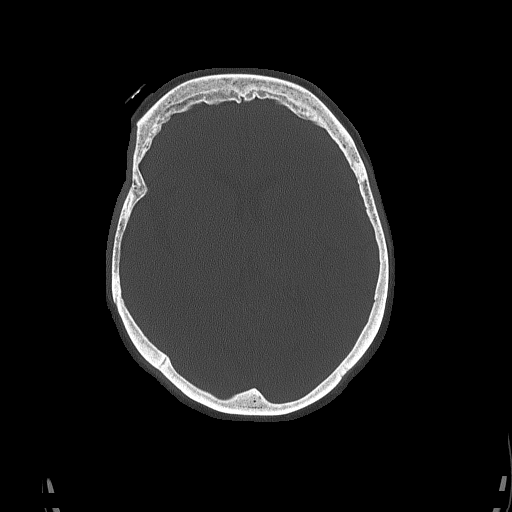

[Series 4: coronal soft · coronal · 0.35mm/px · 3 of 65 slices shown]
[im 22/65  brain]
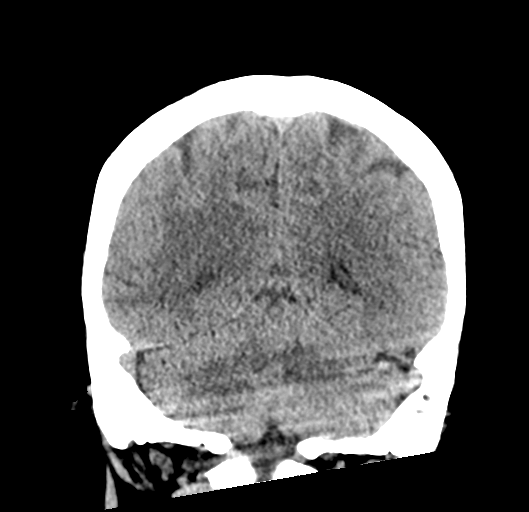
[im 29/65  brain]
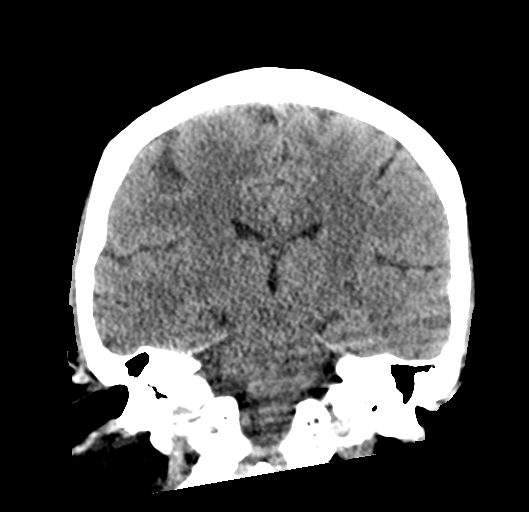
[im 36/65  brain]
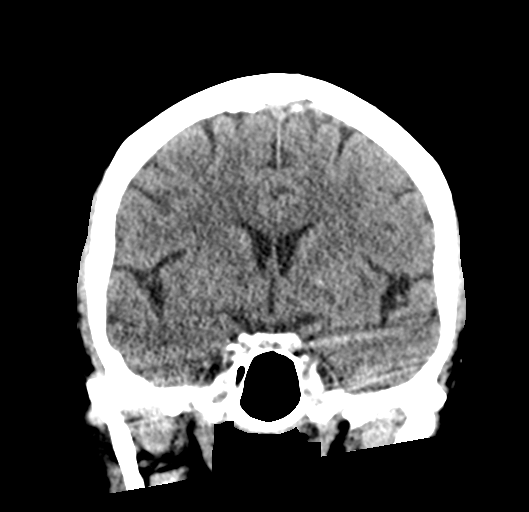

[Series 5: sagittal soft · sagittal · 0.33mm/px · 3 of 52 slices shown]
[im 19/52  brain]
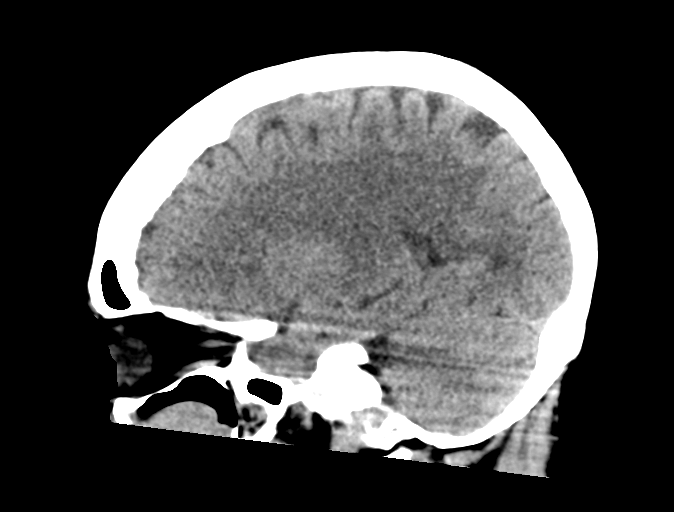
[im 26/52  brain]
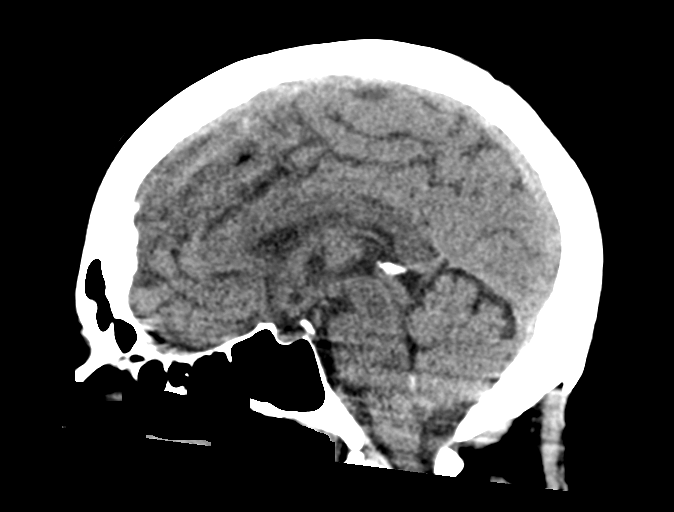
[im 33/52  brain]
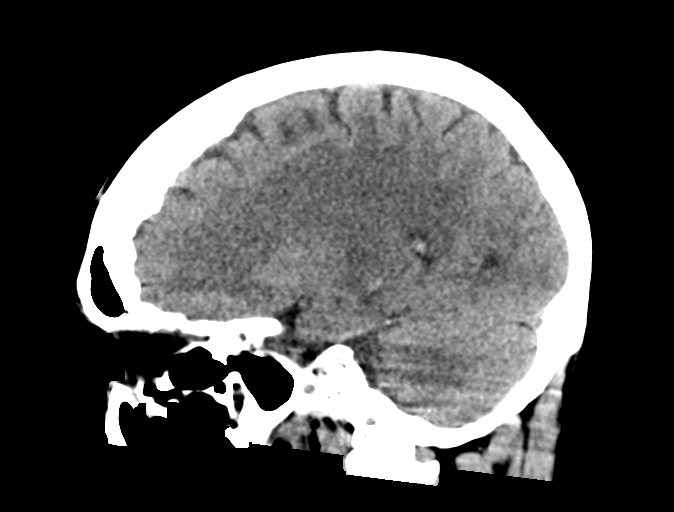

[17 of 47 positions shown; findings below may reference images not displayed]

FINDINGS: Brain: No acute intracranial abnormality. Specifically, no
hemorrhage, hydrocephalus, mass lesion, acute infarction, or
significant intracranial injury.

Vascular: No hyperdense vessel or unexpected calcification.

Skull: No acute calvarial abnormality.

Sinuses/Orbits: Polypoid filling defects in both maxillary sinuses.
No air-fluid levels.

Other: None
IMPRESSION: No acute intracranial abnormality.

Polypoid sinus disease.

## 2023-01-20 ENCOUNTER — Encounter: Payer: Self-pay | Admitting: Internal Medicine

## 2023-01-20 ENCOUNTER — Non-Acute Institutional Stay (SKILLED_NURSING_FACILITY): Payer: Medicare HMO | Admitting: Internal Medicine

## 2023-01-20 DIAGNOSIS — E1122 Type 2 diabetes mellitus with diabetic chronic kidney disease: Secondary | ICD-10-CM | POA: Diagnosis not present

## 2023-01-20 DIAGNOSIS — N184 Chronic kidney disease, stage 4 (severe): Secondary | ICD-10-CM | POA: Diagnosis not present

## 2023-01-20 DIAGNOSIS — D638 Anemia in other chronic diseases classified elsewhere: Secondary | ICD-10-CM | POA: Insufficient documentation

## 2023-01-20 DIAGNOSIS — I10 Essential (primary) hypertension: Secondary | ICD-10-CM | POA: Diagnosis not present

## 2023-01-20 DIAGNOSIS — R29818 Other symptoms and signs involving the nervous system: Secondary | ICD-10-CM

## 2023-01-20 DIAGNOSIS — Z794 Long term (current) use of insulin: Secondary | ICD-10-CM

## 2023-01-20 DIAGNOSIS — R4189 Other symptoms and signs involving cognitive functions and awareness: Secondary | ICD-10-CM

## 2023-01-20 NOTE — Assessment & Plan Note (Signed)
Current renal function is high stage IIIb with a creatinine 1.25 and GFR 43.  Med list reviewed; no change indicated.  Her A1c has improved from 8.8% to 8.5%; this will be updated late next month.

## 2023-01-20 NOTE — Patient Instructions (Signed)
See assessment and plan under each diagnosis in the problem list and acutely for this visit 

## 2023-01-20 NOTE — Assessment & Plan Note (Signed)
01/20/2023 again she cannot explain the Gastroenterologist's findings and recommendations.  She has been noncompliant with the recommendation for Ensure daily.  Formal MMSE indicated; this was discussed with Therapy.

## 2023-01-20 NOTE — Progress Notes (Signed)
NURSING HOME LOCATION:  [facility] ROOM NUMBER:  [phone]  CODE STATUS:  DNR  PCP:  Synthia Innocent NP  This is a nursing facility follow up visit of chronic medical diagnoses & to document compliance with Regulation 483.30 (c) in The [employer] Term [employer] Survey Manual Phase 2 which mandates caregiver visit ( visits can alternate among physician, PA or NP as per statutes) within 10 days of 30 days / 60 days/ 90 days post admission to SNF date    Interim medical record and care since last SNF visit was updated with review of diagnostic studies and change in clinical status since last visit were documented.  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 Significant surgeries include cervical decompression, discectomy and fusion.  Also she has had esophageal dilation. Gastroenterology NP Brooke Bonito saw the patient 5/1 for anorexia.  The patient's most recent EGD was 11/19/2022.  Clinical gastritis was documented; biopsy was negative for H. pylori . No esophageal stenosis or stricture was present.  The patient has the poor appetite in the context of rectal and abdominal pain with fecal impaction.  Bowel regimen has been initiated. Linzess was continued at the same dosage. MiraLAX twice daily was initiated. KUB 12/30/2022 revealed nonobstructive bowel pattern.  Extensive labs were performed in early March.  There has been progression of the anemia with H/H of 8.4/26.5, down from prior values of 9.6/29.9.  Normochromic, normocytic indices were present.  Differential was normal.  She has CKD high stage IIIb CKD with a creatinine 1.25 and GFR of 43.  Significant hypocalcemia was present with a  value of 7.5; vitamin D level was 26.12.  She is on a daily calcium and vitamin D supplement.  A1c control had improved with a value of 8.5%, down from 8.8%.  Goal would be less than 8.  She is on insulin as well as [place].  Review of systems: She could not define what the Gastroenterologist had recommended.  She states that she takes Ensure "once in a while" rather than daily as recommended.  She states there is been little change in her appetite or constipation.  She does deny dysphagia, heartburn, or bloating.  She does describe occasional hemorrhoidal bleeding.  She has no other bleeding dyscrasias.  Constitutional: No fever, significant weight change  Eyes: No redness, discharge, pain, vision change ENT/mouth: No nasal congestion,  purulent discharge, earache, change in hearing, sore throat  Cardiovascular: No chest pain, palpitations, paroxysmal nocturnal dyspnea, edema  Respiratory: No cough, sputum production, hemoptysis, DOE, significant snoring, apnea   Gastrointestinal: No nausea /vomiting, melena Genitourinary: No dysuria, hematuria, pyuria Musculoskeletal: No joint stiffness, joint swelling, weakness, pain Dermatologic: No rash, pruritus, change in appearance of skin Neurologic: No dizziness, headache, syncope, seizures, numbness, tingling Psychiatric: No significant anxiety, depression, insomnia Endocrine: No change in hair/skin/nails, excessive thirst, excessive hunger, excessive urination  Hematologic/lymphatic: No significant bruising, lymphadenopathy, abnormal bleeding Allergy/immunology: No itchy/watery eyes, significant sneezing, urticaria, angioedema  Physical exam:  Pertinent or positive findings: She was initially asleep exhibiting hypopnea.  There was no snoring or frank apnea.  She did arouse easily.  Affect was flat.  She is wearing only the upper plate.  There is splitting of the second heart sound.  Breath sounds are decreased.  Bowel sounds are decreased.  Dorsalis  pedis pulses are stronger than posterior tibial pulses.  She is weak to opposition in all extremities, greatest in the left upper extremity.  General appearance: Adequately nourished; no acute distress, increased work of breathing is present.   Lymphatic: No lymphadenopathy about the head, neck, axilla. Eyes: No conjunctival inflammation or lid edema is present. There is no scleral icterus. Ears:  External ear exam shows no significant lesions or deformities.   Nose:  External nasal examination shows no deformity or inflammation. Nasal mucosa are pink and moist without lesions, exudates Neck:  No thyromegaly, masses, tenderness noted.    Heart:  Normal rate and regular rhythm. S1 normal without gallop, murmur, click, rub .  Lungs:  without wheezes, rhonchi, rales, rubs. Abdomen: Abdomen is soft and nontender with no organomegaly, hernias, masses. GU: Deferred  Extremities:  No cyanosis, clubbing, edema  Neurologic exam :Balance, Rhomberg, finger to nose testing could not be completed due to clinical state Skin: Warm & dry w/o tenting. No significant lesions or rash.  See summary under each active problem in the Problem List with associated updated therapeutic plan

## 2023-01-20 NOTE — Assessment & Plan Note (Signed)
BP controlled; no change in antihypertensive medications  

## 2023-01-20 NOTE — Assessment & Plan Note (Addendum)
Serially her anemia has progressed, dropping from 9.6/29.9 down to 8.4/26.5 on 10/31/2022.  Other than hemorrhoidal bleeding; she denies any active bleeding dyscrasias.  She does have CKD but is now high stage IIIb and should not cause anemia of this degree.  CBC will be rechecked.

## 2023-01-25 ENCOUNTER — Other Ambulatory Visit (HOSPITAL_COMMUNITY)
Admission: RE | Admit: 2023-01-25 | Discharge: 2023-01-25 | Disposition: A | Discharge status: Home / Self Care | Payer: Medicare HMO | Source: Skilled Nursing Facility | Attending: Adult Health | Admitting: Adult Health

## 2023-01-25 DIAGNOSIS — Z79899 Other long term (current) drug therapy: Secondary | ICD-10-CM | POA: Diagnosis not present

## 2023-01-25 DIAGNOSIS — N189 Chronic kidney disease, unspecified: Secondary | ICD-10-CM | POA: Insufficient documentation

## 2023-01-25 DIAGNOSIS — I129 Hypertensive chronic kidney disease with stage 1 through stage 4 chronic kidney disease, or unspecified chronic kidney disease: Secondary | ICD-10-CM | POA: Insufficient documentation

## 2023-01-25 LAB — CBC
HCT: 27 % — ABNORMAL LOW (ref 36.0–46.0)
Hemoglobin: 8.8 g/dL — ABNORMAL LOW (ref 12.0–15.0)
MCH: 30.9 pg (ref 26.0–34.0)
MCHC: 32.6 g/dL (ref 30.0–36.0)
MCV: 94.7 fL (ref 80.0–100.0)
Platelets: 178 10*3/uL (ref 150–400)
RBC: 2.85 MIL/uL — ABNORMAL LOW (ref 3.87–5.11)
RDW: 14.1 % (ref 11.5–15.5)
WBC: 4.5 10*3/uL (ref 4.0–10.5)
nRBC: 0 % (ref 0.0–0.2)

## 2023-01-25 LAB — IRON AND TIBC
Iron: 54 ug/dL (ref 28–170)
Saturation Ratios: 18 % (ref 10.4–31.8)
TIBC: 305 ug/dL (ref 250–450)
UIBC: 251 ug/dL

## 2023-01-25 LAB — COMPREHENSIVE METABOLIC PANEL
ALT: 14 U/L (ref 0–44)
AST: 16 U/L (ref 15–41)
Albumin: 3.4 g/dL — ABNORMAL LOW (ref 3.5–5.0)
Alkaline Phosphatase: 50 U/L (ref 38–126)
Anion gap: 12 (ref 5–15)
BUN: 45 mg/dL — ABNORMAL HIGH (ref 8–23)
CO2: 14 mmol/L — ABNORMAL LOW (ref 22–32)
Calcium: 8.7 mg/dL — ABNORMAL LOW (ref 8.9–10.3)
Chloride: 108 mmol/L (ref 98–111)
Creatinine, Ser: 1.58 mg/dL — ABNORMAL HIGH (ref 0.44–1.00)
GFR, Estimated: 32 mL/min — ABNORMAL LOW (ref >60)
Glucose, Bld: 163 mg/dL — ABNORMAL HIGH (ref 70–99)
Potassium: 5.8 mmol/L — ABNORMAL HIGH (ref 3.5–5.1)
Sodium: 134 mmol/L — ABNORMAL LOW (ref 135–145)
Total Bilirubin: 0.3 mg/dL (ref 0.3–1.2)
Total Protein: 6.6 g/dL (ref 6.5–8.1)

## 2023-01-25 LAB — VITAMIN B12: Vitamin B-12: 496 pg/mL (ref 180–914)

## 2023-01-26 ENCOUNTER — Other Ambulatory Visit (HOSPITAL_COMMUNITY)
Admission: RE | Admit: 2023-01-26 | Discharge: 2023-01-26 | Disposition: A | Discharge status: Home / Self Care | Payer: Medicare HMO | Source: Skilled Nursing Facility | Attending: Adult Health | Admitting: Adult Health

## 2023-01-26 ENCOUNTER — Encounter: Payer: Self-pay | Admitting: Adult Health

## 2023-01-26 ENCOUNTER — Non-Acute Institutional Stay (SKILLED_NURSING_FACILITY): Payer: Medicare HMO | Admitting: Adult Health

## 2023-01-26 DIAGNOSIS — R569 Unspecified convulsions: Secondary | ICD-10-CM | POA: Diagnosis not present

## 2023-01-26 DIAGNOSIS — E875 Hyperkalemia: Secondary | ICD-10-CM | POA: Diagnosis not present

## 2023-01-26 DIAGNOSIS — Z66 Do not resuscitate: Secondary | ICD-10-CM

## 2023-01-26 DIAGNOSIS — Z79899 Other long term (current) drug therapy: Secondary | ICD-10-CM

## 2023-01-26 LAB — BASIC METABOLIC PANEL
Anion gap: 12 (ref 5–15)
BUN: 41 mg/dL — ABNORMAL HIGH (ref 8–23)
CO2: 15 mmol/L — ABNORMAL LOW (ref 22–32)
Calcium: 8.5 mg/dL — ABNORMAL LOW (ref 8.9–10.3)
Chloride: 106 mmol/L (ref 98–111)
Creatinine, Ser: 1.54 mg/dL — ABNORMAL HIGH (ref 0.44–1.00)
GFR, Estimated: 33 mL/min — ABNORMAL LOW (ref >60)
Glucose, Bld: 116 mg/dL — ABNORMAL HIGH (ref 70–99)
Potassium: 5.5 mmol/L — ABNORMAL HIGH (ref 3.5–5.1)
Sodium: 133 mmol/L — ABNORMAL LOW (ref 135–145)

## 2023-01-26 LAB — LEVETIRACETAM LEVEL: Levetiracetam Lvl: 80.7 ug/mL — ABNORMAL HIGH (ref 10.0–40.0)

## 2023-01-26 NOTE — Progress Notes (Signed)
Location:  [facility] Room Number: [address] Place of Service:  SNF (31)   CODE STATUS: DNR  Allergies  Allergen Reactions   Ace Inhibitors Other (See Comments)    Hyperkalemia    Codeine     Unknown reaction   Sulfa Antibiotics     Unknown reaction    Chief Complaint  Patient presents with   Follow-up    Discuss recent labs     HPI:  She had a 5 minute seizure on 01-25-23; had labs drawn. Her keppra level returned today at 80.7.  She has not had any further seizure activity. She is presently taking keppra 1 gm in the AM and 1500 mg in the PM. There are no recent reports of missed medications.   Past Medical History:  Diagnosis Date   Adult failure to thrive    Anemia    Anxiety    Atherosclerosis of aorta (HCC)    Central cord syndrome at C4 level of cervical spinal cord, subsequent encounter (HCC)    CKD (chronic kidney disease)    stage 3   Depression    DM type 2 with diabetic peripheral neuropathy (HCC)    Dysphagia    GERD (gastroesophageal reflux disease)    Gout    High cholesterol    HTN (hypertension)    Hyponatremia    MDD (major depressive disorder)    Neck pain    Neuropathy    Quadriplegia, C1-C4 incomplete (HCC)    Radiculopathy    RLS (restless legs syndrome)    Urinary retention     Past Surgical History:  Procedure Laterality Date   ABDOMINAL HYSTERECTOMY     ANTERIOR CERVICAL DECOMP/DISCECTOMY FUSION N/A 02/05/2021   Procedure: Cervical Three-Four  Anterior cervical decompression/discectomy/fusion;  Surgeon: Coletta Memos, MD;  Location: [facility] OR;  Service: Neurosurgery;  Laterality: N/A;  RM 20   APPENDECTOMY     BACK SURGERY     BALLOON DILATION N/A 11/19/2022   Procedure: BALLOON DILATION;  Surgeon: Lanelle Bal, DO;  Location: [facility];  Service: Endoscopy;  Laterality: N/A;   BIOPSY  09/22/2021   Procedure: BIOPSY;  Surgeon: Lanelle Bal, DO;  Location: [facility];  Service: Endoscopy;;   BIOPSY   11/19/2022   Procedure: BIOPSY;  Surgeon: Lanelle Bal, DO;  Location: [facility];  Service: Endoscopy;;   CERVICAL DISC SURGERY     ESOPHAGOGASTRODUODENOSCOPY (EGD) WITH PROPOFOL N/A 09/22/2021   Procedure: ESOPHAGOGASTRODUODENOSCOPY (EGD) WITH PROPOFOL;  Surgeon: Lanelle Bal, DO;  Location: [facility];  Service: Endoscopy;  Laterality: N/A;  1:00pm   ESOPHAGOGASTRODUODENOSCOPY (EGD) WITH PROPOFOL N/A 11/19/2022   Procedure: ESOPHAGOGASTRODUODENOSCOPY (EGD) WITH PROPOFOL;  Surgeon: Lanelle Bal, DO;  Location: [facility];  Service: Endoscopy;  Laterality: N/A;  11:30 am, asa 3   HAND SURGERY     KNEE SURGERY      Social History   Socioeconomic History   Marital status: Widowed    Spouse name: Not on file   Number of children: Not on file   Years of education: Not on file   Highest education level: Not on file  Occupational History   Not on file  Tobacco Use   Smoking status: Never   Smokeless tobacco: Never  Vaping Use   Vaping Use: Never used  Substance and Sexual Activity   Alcohol use: Never   Drug use: Never   Sexual activity: Not Currently  Other Topics  Concern   Not on file  Social History Narrative   Lives at [facility]   Social Determinants of Health   Financial Resource Strain: Not on file  Food Insecurity: Not on file  Transportation Needs: Not on file  Physical Activity: Not on file  Stress: Not on file  Social Connections: Not on file  Intimate Partner Violence: Not on file   Family History  Problem Relation Age of Onset   Cancer Mother    Cardiomyopathy Father    Seizures Sister        childhood   Seizures Brother        in his 6s   Colon cancer Neg Hx       VITAL SIGNS BP (!) 142/58   Pulse 77   Ht 5\' 2"  (1.575 m)   Wt 175 lb (79.4 kg)   SpO2 97%   BMI 32.01 kg/m   Outpatient Encounter Medications as of 01/26/2023  Medication Sig   acetaminophen (TYLENOL) 325 MG tablet Take 650 mg by mouth every 8  (eight) hours.   AMBULATORY NON FORMULARY MEDICATION Medication Name:  Continue monthly catheter changes with 65fr catheter at skilled nursing facility.   amLODipine (NORVASC) 5 MG tablet Take 5 mg by mouth daily.   aspirin EC 81 MG tablet Take 81 mg by mouth daily. Swallow whole.9 am   augmented betamethasone dipropionate (DIPROLENE-AF) 0.05 % ointment Apply 1 Application topically 2 (two) times daily as needed. Apply to stomach and both breast   calcium-vitamin D (OSCAL WITH D) 500-5 MG-MCG tablet Take 1 tablet by mouth daily.   camphor-menthol (SARNA) lotion Apply 1 Application topically 2 (two) times daily as needed for itching.   dapagliflozin propanediol (FARXIGA) 5 MG TABS tablet Take 5 mg by mouth daily.   DULoxetine (CYMBALTA) 20 MG capsule Take 20 mg by mouth daily.   feeding supplement (ENSURE ENLIVE / ENSURE PLUS) LIQD Take 237 mLs by mouth daily.   fexofenadine (ALLEGRA) 60 MG tablet Take 60 mg by mouth daily.   gabapentin (NEURONTIN) 300 MG capsule Take 300 mg by mouth 3 (three) times daily.   insulin aspart (NOVOLOG FLEXPEN) 100 UNIT/ML FlexPen Inject 5 Units into the skin 3 (three) times daily with meals. 8 am, 12 pm, and 6 pm   Insulin Pen Needle 30G X 5 MM MISC 1 Device by Does not apply route daily. 3/16"   ipratropium-albuterol (DUONEB) 0.5-2.5 (3) MG/3ML SOLN Take 3 mLs by nebulization every 6 (six) hours as needed (wheezing).   LANTUS SOLOSTAR 100 UNIT/ML Solostar Pen Inject 25 Units into the skin at bedtime.   levETIRAcetam (KEPPRA) 1000 MG tablet Take 1,000 mg by mouth 2 (two) times daily.   levETIRAcetam (KEPPRA) 500 MG tablet Take 500 mg by mouth at bedtime. Along with 1000 mg for a total on 1500 mg in the pm (see other listing)   lidocaine (LIDODERM) 5 % Place 1 patch onto the skin 2 (two) times daily as needed (pain). Apply to Right shoulder as needed once a morning and remove at bedtime   linaclotide (LINZESS) 72 MCG capsule Take 1 capsule (72 mcg total) by mouth  daily before breakfast.   LORazepam (ATIVAN) 2 MG/ML injection Inject 0.5 mLs (1 mg total) into the muscle every 15 (fifteen) minutes as needed.   melatonin 5 MG TABS Take 5 mg by mouth at bedtime.   Multiple Vitamins-Minerals (THEREMS M PO) Take 1 tablet by mouth daily. (multivitamin with folic acid) tablet; 400 mcg;  NON FORMULARY Diet - Regular   nystatin cream (MYCOSTATIN) Apply 1 Application topically 2 (two) times daily as needed (vaginal itching or pain).   omeprazole (PRILOSEC) 40 MG capsule Take 1 capsule (40 mg total) by mouth in the morning and at bedtime.   Phenylephrine-Cocoa Butter 0.25-85.5 % SUPP Place 1 suppository rectally 2 (two) times daily as needed.   polyethylene glycol (MIRALAX / GLYCOLAX) 17 g packet Take 17 g by mouth daily.   rOPINIRole (REQUIP) 1 MG tablet Take 1 mg by mouth at bedtime.   rosuvastatin (CRESTOR) 20 MG tablet Take 20 mg by mouth every evening.   tiZANidine (ZANAFLEX) 2 MG tablet Take 2 mg by mouth every 6 (six) hours as needed for muscle spasms (for back and sciatic pain).   zinc oxide 20 % ointment Apply 1 Application topically See admin instructions. Apply every shift to bilateral buttocks, sacrum, and coccyx   [DISCONTINUED] melatonin 3 MG TABS tablet Take 3 mg by mouth at bedtime.   No facility-administered encounter medications on file as of 01/26/2023.     SIGNIFICANT DIAGNOSTIC EXAMS  PREVIOUS   04-15-22; dexa: t score -3.391  NO NEW EXAMS    LABS REVIEWED PREVIOUS   01-15-22: hgb a1c 6.7 chol 148; ldl 87; trig 112; hdl 39; tsh 5.663  02-11-22: urine culture: multiple bacteria  04-09-22: liver normal protein 6.0 albumin 3.3; chol 143; ldl 81; trig 71; hdl 48; tsh 3.502  04-27-22; wbc 6.6; hgb 8.7; hct 26.2; mcv 94.6 plt 231; glucose 152; bun 66; creat 1.26; k+ 4.7; na++ 132; ca 8.9; gfr 42; protein 6.7 albumin 3.4 hgb a1c 7.0; vitamin D 37.96 07-09-22: wbc 9.3; hgb 8.5; hct 26.9; mcv 96.8 plt 196; glucose 200; bun 65; creat 2.15; k+ 5.0;  na++ 128; ca 7.8; gfr 22 07-10-22: glucose 130; bun 59; creat 1.75; k+ 4.2; na++ 131; ca 7.6; gfr 29 07-30-22: urine micro-albumin 59.7; ACR 127   08-20-22: hgb A1c 8.8 10-01-22: wbc 5.6; hgb 9.6; hct 29.9; mcv 94.9 plt 245; glucose 404; bun 43; creat 1.69; k+ 5.2; na++ 135; ca 8.2 gfr 30 protein 7.2 albumin 3.5 10-19-22: glucose 176; bun 45; creat 1.63; k+ 5.8; na++ 134; ca 8.9; gfr 31 10-31-22: wbc 4.6; hgb 8.4; hct 26.5; mcv 94.0 plt 183; glucose 93; bun 46; creat 1.64; k+ 5.2; na++ 134; ca 8.6; gfr 31; protein 6.5 albumin 3.2  11-05-22: glucose 117; bun 35; creat 1.25; k+ 4.3;na++ 137; ca 7.5; gfr 43; hgb A1c 8.5; vitamin D 26.12; chol 149; ldl 73 trig 166; hdl 43   TODAY  01-25-23: wbc 4.5; hgb 8.8; hct 27.0; mcv 94.7 plt 178; glucose 163; bun 45; creat 1.58; k+ 5.8; na++ 134; ca 8.7; gfr 32; protein 6.6 albumin 3.4 iron 54; tibc 305; vitamin B12: 496; keppra 80.7 (normal 10-40) 01-26-23: glucose 116; bun 41; creat 1.54; k+ 5.5; na++ 133; ca 8.5 gfr 33    Review of Systems  Constitutional:  Negative for malaise/fatigue.  Respiratory:  Negative for cough and shortness of breath.   Cardiovascular:  Negative for chest pain, palpitations and leg swelling.  Gastrointestinal:  Negative for abdominal pain, constipation and heartburn.  Musculoskeletal:  Negative for back pain, joint pain and myalgias.  Skin: Negative.   Neurological:  Negative for dizziness.  Psychiatric/Behavioral:  The patient is not nervous/anxious.    Physical Exam Constitutional:      General: She is not in acute distress.    Appearance: She is well-developed and overweight. She is not  diaphoretic.  Neck:     Thyroid: No thyromegaly.  Cardiovascular:     Rate and Rhythm: Normal rate and regular rhythm.     Pulses: Normal pulses.     Heart sounds: Normal heart sounds.  Pulmonary:     Effort: Pulmonary effort is normal. No respiratory distress.     Breath sounds: Normal breath sounds.  Abdominal:     General: Bowel  sounds are normal. There is no distension.     Palpations: Abdomen is soft.     Tenderness: There is no abdominal tenderness.  Genitourinary:    Comments: foley Musculoskeletal:     Cervical back: Neck supple.     Right lower leg: No edema.     Left lower leg: No edema.     Comments: Does not move lower extremities   Lymphadenopathy:     Cervical: No cervical adenopathy.  Skin:    General: Skin is warm and dry.  Neurological:     Mental Status: She is alert and oriented to person, place, and time.  Psychiatric:        Mood and Affect: Mood normal.      ASSESSMENT/ PLAN:  TODAY  Seizures High risk medication (not anticoagulant) use  I have spoken with the pharmacist regarding her keppra labs; will lower her keppra to 750 mg twice daily and will repeat labs on 02-08-23 Will repeat kolema 10 gm one time for her elevated k+ level  Will increase calcium to 500 mg twice daily    Synthia Innocent NP [facility]  call [phone]

## 2023-01-27 DIAGNOSIS — Z79899 Other long term (current) drug therapy: Secondary | ICD-10-CM | POA: Insufficient documentation

## 2023-01-28 ENCOUNTER — Other Ambulatory Visit (HOSPITAL_COMMUNITY)
Admission: RE | Admit: 2023-01-28 | Discharge: 2023-01-28 | Disposition: A | Discharge status: Home / Self Care | Payer: Medicare HMO | Source: Skilled Nursing Facility | Attending: Adult Health | Admitting: Adult Health

## 2023-01-28 ENCOUNTER — Encounter: Payer: Self-pay | Admitting: Adult Health

## 2023-01-28 DIAGNOSIS — E875 Hyperkalemia: Secondary | ICD-10-CM | POA: Diagnosis not present

## 2023-01-28 LAB — BASIC METABOLIC PANEL
Anion gap: 10 (ref 5–15)
BUN: 44 mg/dL — ABNORMAL HIGH (ref 8–23)
CO2: 19 mmol/L — ABNORMAL LOW (ref 22–32)
Calcium: 8.8 mg/dL — ABNORMAL LOW (ref 8.9–10.3)
Chloride: 104 mmol/L (ref 98–111)
Creatinine, Ser: 1.42 mg/dL — ABNORMAL HIGH (ref 0.44–1.00)
GFR, Estimated: 36 mL/min — ABNORMAL LOW (ref >60)
Glucose, Bld: 141 mg/dL — ABNORMAL HIGH (ref 70–99)
Potassium: 5.2 mmol/L — ABNORMAL HIGH (ref 3.5–5.1)
Sodium: 133 mmol/L — ABNORMAL LOW (ref 135–145)

## 2023-01-28 NOTE — Progress Notes (Signed)
Location:  [facility] Room Number: 154-W Place of Service:  SNF (31)   CODE STATUS: DNR  Allergies  Allergen Reactions   Ace Inhibitors Other (See Comments)    Hyperkalemia    Codeine     Unknown reaction   Sulfa Antibiotics     Unknown reaction    Chief Complaint  Patient presents with   Acute Visit    Lab follow-up    HPI:    Past Medical History:  Diagnosis Date   Adult failure to thrive    Anemia    Anxiety    Atherosclerosis of aorta (HCC)    Central cord syndrome at C4 level of cervical spinal cord, subsequent encounter (HCC)    CKD (chronic kidney disease)    stage 3   Depression    DM type 2 with diabetic peripheral neuropathy (HCC)    Dysphagia    GERD (gastroesophageal reflux disease)    Gout    High cholesterol    HTN (hypertension)    Hyponatremia    MDD (major depressive disorder)    Neck pain    Neuropathy    Quadriplegia, C1-C4 incomplete (HCC)    Radiculopathy    RLS (restless legs syndrome)    Urinary retention     Past Surgical History:  Procedure Laterality Date   ABDOMINAL HYSTERECTOMY     ANTERIOR CERVICAL DECOMP/DISCECTOMY FUSION N/A 02/05/2021   Procedure: Cervical Three-Four  Anterior cervical decompression/discectomy/fusion;  Surgeon: Coletta Memos, MD;  [facility]: [facility] OR;  Service: Neurosurgery;  Laterality: N/A;  RM 20   APPENDECTOMY     BACK SURGERY     BALLOON DILATION N/A 11/19/2022   Procedure: BALLOON DILATION;  Surgeon: Lanelle Bal, DO;  [facility]: [facility];  Service: Endoscopy;  Laterality: N/A;   BIOPSY  09/22/2021   Procedure: BIOPSY;  Surgeon: Lanelle Bal, DO;  [facility];  Service: Endoscopy;;   BIOPSY  11/19/2022   Procedure: BIOPSY;  Surgeon: Lanelle Bal, DO;  [facility];  Service: Endoscopy;;   CERVICAL DISC SURGERY     ESOPHAGOGASTRODUODENOSCOPY (EGD) WITH PROPOFOL N/A 09/22/2021   Procedure: ESOPHAGOGASTRODUODENOSCOPY (EGD) WITH PROPOFOL;   Surgeon: Lanelle Bal, DO;  [facility];  Service: Endoscopy;  Laterality: N/A;  1:00pm   ESOPHAGOGASTRODUODENOSCOPY (EGD) WITH PROPOFOL N/A 11/19/2022   Procedure: ESOPHAGOGASTRODUODENOSCOPY (EGD) WITH PROPOFOL;  Surgeon: Lanelle Bal, DO;  [facility];  Service: Endoscopy;  Laterality: N/A;  11:30 am, asa 3   HAND SURGERY     KNEE SURGERY      Social History   Socioeconomic History   Marital status: Widowed    Spouse name: Not on file   Number of children: Not on file   Years of education: Not on file   Highest education level: Not on file  Occupational History   Not on file  Tobacco Use   Smoking status: Never   Smokeless tobacco: Never  Vaping Use   Vaping Use: Never used  Substance and Sexual Activity   Alcohol use: Never   Drug use: Never   Sexual activity: Not Currently  Other Topics Concern   Not on file  Social History Narrative   Lives at [facility]   Social Determinants of Health   Financial Resource Strain: Not on file  Food Insecurity: Not on file  Transportation Needs: Not on file  Physical Activity: Not on file  Stress: Not on  file  Social Connections: Not on file  Intimate Partner Violence: Not on file   Family History  Problem Relation Age of Onset   Cancer Mother    Cardiomyopathy Father    Seizures Sister        childhood   Seizures Brother        in his 17s   Colon cancer Neg Hx       VITAL SIGNS BP (!) 142/58   Pulse 77   Temp (!) 97.2 F (36.2 C)   Resp 16   Ht 5\' 2"  (1.575 m)   Wt 175 lb 12.8 oz (79.7 kg)   SpO2 97%   BMI 32.15 kg/m   Outpatient Encounter Medications as of 01/28/2023  Medication Sig   acetaminophen (TYLENOL) 325 MG tablet Take 650 mg by mouth every 8 (eight) hours.   AMBULATORY NON FORMULARY MEDICATION Medication Name:  Continue monthly catheter changes with 45fr catheter at skilled nursing facility.   amLODipine (NORVASC) 5 MG tablet Take 5 mg by mouth daily.    aspirin EC 81 MG tablet Take 81 mg by mouth daily. Swallow whole.9 am   augmented betamethasone dipropionate (DIPROLENE-AF) 0.05 % ointment Apply 1 Application topically 2 (two) times daily as needed. Apply to stomach and both breast   calcium-vitamin D (OSCAL WITH D) 500-5 MG-MCG tablet Take 1 tablet by mouth daily.   camphor-menthol (SARNA) lotion Apply 1 Application topically 2 (two) times daily as needed for itching.   dapagliflozin propanediol (FARXIGA) 5 MG TABS tablet Take 5 mg by mouth daily.   DULoxetine (CYMBALTA) 20 MG capsule Take 20 mg by mouth daily.   feeding supplement (ENSURE ENLIVE / ENSURE PLUS) LIQD Take 237 mLs by mouth daily.   fexofenadine (ALLEGRA) 60 MG tablet Take 60 mg by mouth daily.   gabapentin (NEURONTIN) 300 MG capsule Take 300 mg by mouth 3 (three) times daily.   insulin aspart (NOVOLOG FLEXPEN) 100 UNIT/ML FlexPen Inject 5 Units into the skin 3 (three) times daily with meals. 8 am, 12 pm, and 6 pm   Insulin Pen Needle 30G X 5 MM MISC 1 Device by Does not apply route daily. 3/16"   ipratropium-albuterol (DUONEB) 0.5-2.5 (3) MG/3ML SOLN Take 3 mLs by nebulization every 6 (six) hours as needed (wheezing).   LANTUS SOLOSTAR 100 UNIT/ML Solostar Pen Inject 25 Units into the skin at bedtime.   levETIRAcetam (KEPPRA) 750 MG tablet Take 750 mg by mouth 2 (two) times daily.   lidocaine (LIDODERM) 5 % Place 1 patch onto the skin 2 (two) times daily as needed (pain). Apply to Right shoulder as needed once a morning and remove at bedtime   linaclotide (LINZESS) 72 MCG capsule Take 1 capsule (72 mcg total) by mouth daily before breakfast.   LORazepam (ATIVAN) 2 MG/ML injection Inject 0.5 mLs (1 mg total) into the muscle every 15 (fifteen) minutes as needed.   melatonin 5 MG TABS Take 5 mg by mouth at bedtime.   Multiple Vitamins-Minerals (THEREMS M PO) Take 1 tablet by mouth daily. (multivitamin with folic acid) tablet; 400 mcg;   NON FORMULARY Diet - Regular   nystatin cream  (MYCOSTATIN) Apply 1 Application topically 2 (two) times daily as needed (vaginal itching or pain).   omeprazole (PRILOSEC) 40 MG capsule Take 1 capsule (40 mg total) by mouth in the morning and at bedtime.   Phenylephrine-Cocoa Butter 0.25-85.5 % SUPP Place 1 suppository rectally 2 (two) times daily as needed.   polyethylene glycol (MIRALAX /  GLYCOLAX) 17 g packet Take 17 g by mouth daily.   rOPINIRole (REQUIP) 1 MG tablet Take 1 mg by mouth at bedtime.   rosuvastatin (CRESTOR) 20 MG tablet Take 20 mg by mouth every evening.   tiZANidine (ZANAFLEX) 2 MG tablet Take 2 mg by mouth every 6 (six) hours as needed for muscle spasms (for back and sciatic pain).   zinc oxide 20 % ointment Apply 1 Application topically See admin instructions. Apply every shift to bilateral buttocks, sacrum, and coccyx   [DISCONTINUED] levETIRAcetam (KEPPRA) 1000 MG tablet Take 1,000 mg by mouth 2 (two) times daily.   [DISCONTINUED] levETIRAcetam (KEPPRA) 500 MG tablet Take 500 mg by mouth at bedtime. Along with 1000 mg for a total on 1500 mg in the pm (see other listing)   No facility-administered encounter medications on file as of 01/28/2023.     SIGNIFICANT DIAGNOSTIC EXAMS       ASSESSMENT/ PLAN:     Synthia Innocent NP [facility]  Contact [phone] Monday through Friday 8am- 5pm  After hours call [phone]

## 2023-01-29 NOTE — Progress Notes (Signed)
This encounter was created in error - please disregard.

## 2023-02-01 ENCOUNTER — Other Ambulatory Visit (HOSPITAL_COMMUNITY)
Admission: RE | Admit: 2023-02-01 | Discharge: 2023-02-01 | Disposition: A | Discharge status: Home / Self Care | Payer: Medicare HMO | Source: Skilled Nursing Facility | Attending: Adult Health | Admitting: Adult Health

## 2023-02-01 DIAGNOSIS — I129 Hypertensive chronic kidney disease with stage 1 through stage 4 chronic kidney disease, or unspecified chronic kidney disease: Secondary | ICD-10-CM | POA: Diagnosis not present

## 2023-02-01 LAB — BASIC METABOLIC PANEL
Anion gap: 9 (ref 5–15)
BUN: 45 mg/dL — ABNORMAL HIGH (ref 8–23)
CO2: 18 mmol/L — ABNORMAL LOW (ref 22–32)
Calcium: 8.3 mg/dL — ABNORMAL LOW (ref 8.9–10.3)
Chloride: 107 mmol/L (ref 98–111)
Creatinine, Ser: 1.62 mg/dL — ABNORMAL HIGH (ref 0.44–1.00)
GFR, Estimated: 31 mL/min — ABNORMAL LOW (ref >60)
Glucose, Bld: 123 mg/dL — ABNORMAL HIGH (ref 70–99)
Potassium: 5 mmol/L (ref 3.5–5.1)
Sodium: 134 mmol/L — ABNORMAL LOW (ref 135–145)

## 2023-02-08 ENCOUNTER — Other Ambulatory Visit (HOSPITAL_COMMUNITY)
Admission: RE | Admit: 2023-02-08 | Discharge: 2023-02-08 | Disposition: A | Discharge status: Home / Self Care | Payer: Medicare HMO | Source: Skilled Nursing Facility | Attending: Adult Health | Admitting: Adult Health

## 2023-02-08 DIAGNOSIS — R569 Unspecified convulsions: Secondary | ICD-10-CM | POA: Diagnosis not present

## 2023-02-09 LAB — LEVETIRACETAM LEVEL: Levetiracetam Lvl: 41.6 ug/mL — ABNORMAL HIGH (ref 10.0–40.0)

## 2023-02-17 ENCOUNTER — Encounter: Payer: Self-pay | Admitting: Adult Health

## 2023-02-17 ENCOUNTER — Non-Acute Institutional Stay (SKILLED_NURSING_FACILITY): Payer: Medicare HMO | Admitting: Adult Health

## 2023-02-17 DIAGNOSIS — E1122 Type 2 diabetes mellitus with diabetic chronic kidney disease: Secondary | ICD-10-CM | POA: Diagnosis not present

## 2023-02-17 DIAGNOSIS — R339 Retention of urine, unspecified: Secondary | ICD-10-CM | POA: Diagnosis not present

## 2023-02-17 DIAGNOSIS — N319 Neuromuscular dysfunction of bladder, unspecified: Secondary | ICD-10-CM | POA: Diagnosis not present

## 2023-02-17 DIAGNOSIS — N184 Chronic kidney disease, stage 4 (severe): Secondary | ICD-10-CM

## 2023-02-17 NOTE — Progress Notes (Signed)
Location:  [facility] Room Number: [phone] Place of Service:  SNF (31)   CODE STATUS: DNR  Allergies  Allergen Reactions   Ace Inhibitors Other (See Comments)    Hyperkalemia    Codeine     Unknown reaction   Sulfa Antibiotics     Unknown reaction    Chief Complaint  Patient presents with   Medical Management of Chronic Issues                CKD stage 4 due to type 2 diabetes mellitus: Anemia associated with type 2 diabetes mellitus due to underlying condition:  Urine retention/neurogenic bladder     HPI:  She is a 87 year old long term resident of this facility being seen for the management of her chronic illnesses: CKD stage 4 due to type 2 diabetes mellitus: Anemia associated with type 2 diabetes mellitus due to underlying condition:  Urine retention/neurogenic bladder. There are no reports of uncontrolled pain. Her keppra level is near normal since lowering dose. There have been no reports of seizure activities; her weight is stable.   Past Medical History:  Diagnosis Date   Adult failure to thrive    Anemia    Anxiety    Atherosclerosis of aorta (HCC)    Central cord syndrome at C4 level of cervical spinal cord, subsequent encounter (HCC)    CKD (chronic kidney disease)    stage 3   Depression    DM type 2 with diabetic peripheral neuropathy (HCC)    Dysphagia    GERD (gastroesophageal reflux disease)    Gout    High cholesterol    HTN (hypertension)    Hyponatremia    MDD (major depressive disorder)    Neck pain    Neuropathy    Quadriplegia, C1-C4 incomplete (HCC)    Radiculopathy    RLS (restless legs syndrome)    Urinary retention     Past Surgical History:  Procedure Laterality Date   ABDOMINAL HYSTERECTOMY     ANTERIOR CERVICAL DECOMP/DISCECTOMY FUSION N/A 02/05/2021   Procedure: Cervical Three-Four  Anterior cervical decompression/discectomy/fusion;  Surgeon: Coletta Memos, MD;  Location: [facility] OR;  Service: Neurosurgery;   Laterality: N/A;  RM 20   APPENDECTOMY     BACK SURGERY     BALLOON DILATION N/A 11/19/2022   Procedure: BALLOON DILATION;  Surgeon: Lanelle Bal, DO;  [facility];  Service: Endoscopy;  Laterality: N/A;   BIOPSY  09/22/2021   Procedure: BIOPSY;  Surgeon: Lanelle Bal, DO;  [facility];  Service: Endoscopy;;   BIOPSY  11/19/2022   Procedure: BIOPSY;  Surgeon: Lanelle Bal, DO;  [facility];  Service: Endoscopy;;   CERVICAL DISC SURGERY     ESOPHAGOGASTRODUODENOSCOPY (EGD) WITH PROPOFOL N/A 09/22/2021   Procedure: ESOPHAGOGASTRODUODENOSCOPY (EGD) WITH PROPOFOL;  Surgeon: Lanelle Bal, DO;  [facility];  Service: Endoscopy;  Laterality: N/A;  1:00pm   ESOPHAGOGASTRODUODENOSCOPY (EGD) WITH PROPOFOL N/A 11/19/2022   Procedure: ESOPHAGOGASTRODUODENOSCOPY (EGD) WITH PROPOFOL;  Surgeon: Lanelle Bal, DO;  [facility];  Service: Endoscopy;  Laterality: N/A;  11:30 am, asa 3   HAND SURGERY     KNEE SURGERY      Social History   Socioeconomic History   Marital status: Widowed    Spouse name: Not on file   Number of children: Not on file   Years of education: Not on file   Highest education  level: Not on file  Occupational History   Not on file  Tobacco Use   Smoking status: Never   Smokeless tobacco: Never  Vaping Use   Vaping Use: Never used  Substance and Sexual Activity   Alcohol use: Never   Drug use: Never   Sexual activity: Not Currently  Other Topics Concern   Not on file  Social History Narrative   Lives at [facility]   Social Determinants of Health   Financial Resource Strain: Not on file  Food Insecurity: Not on file  Transportation Needs: Not on file  Physical Activity: Not on file  Stress: Not on file  Social Connections: Not on file  Intimate Partner Violence: Not on file   Family History  Problem Relation Age of Onset   Cancer Mother    Cardiomyopathy Father    Seizures  Sister        childhood   Seizures Brother        in his 48s   Colon cancer Neg Hx       VITAL SIGNS BP 137/74   Pulse 75   Temp 97.9 F (36.6 C)   Resp 16   Ht 5\' 2"  (1.575 m)   Wt 177 lb 3.2 oz (80.4 kg)   SpO2 97%   BMI 32.41 kg/m   Outpatient Encounter Medications as of 02/17/2023  Medication Sig   acetaminophen (TYLENOL) 325 MG tablet Take 650 mg by mouth every 8 (eight) hours.   AMBULATORY NON FORMULARY MEDICATION Medication Name:  Continue monthly catheter changes with 63fr catheter at skilled nursing facility.   amLODipine (NORVASC) 5 MG tablet Take 5 mg by mouth daily.   aspirin EC 81 MG tablet Take 81 mg by mouth daily. Swallow whole.9 am   augmented betamethasone dipropionate (DIPROLENE-AF) 0.05 % ointment Apply 1 Application topically 2 (two) times daily as needed. Apply to stomach and both breast   calcium-vitamin D (OSCAL WITH D) 500-5 MG-MCG tablet Take 1 tablet by mouth daily.   camphor-menthol (SARNA) lotion Apply 1 Application topically 2 (two) times daily as needed for itching.   dapagliflozin propanediol (FARXIGA) 5 MG TABS tablet Take 5 mg by mouth daily.   DULoxetine (CYMBALTA) 20 MG capsule Take 20 mg by mouth daily.   feeding supplement (ENSURE ENLIVE / ENSURE PLUS) LIQD Take 237 mLs by mouth daily.   fexofenadine (ALLEGRA) 60 MG tablet Take 60 mg by mouth daily.   gabapentin (NEURONTIN) 300 MG capsule Take 300 mg by mouth 3 (three) times daily.   insulin aspart (NOVOLOG FLEXPEN) 100 UNIT/ML FlexPen Inject 5 Units into the skin 3 (three) times daily with meals. 8 am, 12 pm, and 6 pm   Insulin Pen Needle 30G X 5 MM MISC 1 Device by Does not apply route daily. 3/16"   ipratropium-albuterol (DUONEB) 0.5-2.5 (3) MG/3ML SOLN Take 3 mLs by nebulization every 6 (six) hours as needed (wheezing).   LANTUS SOLOSTAR 100 UNIT/ML Solostar Pen Inject 25 Units into the skin at bedtime.   levETIRAcetam (KEPPRA) 750 MG tablet Take 750 mg by mouth 2 (two) times daily.    lidocaine (LIDODERM) 5 % Place 1 patch onto the skin 2 (two) times daily as needed (pain). Apply to Right shoulder as needed once a morning and remove at bedtime   linaclotide (LINZESS) 72 MCG capsule Take 1 capsule (72 mcg total) by mouth daily before breakfast.   LORazepam (ATIVAN) 2 MG/ML injection Inject 0.5 mLs (1 mg total) into the  muscle every 15 (fifteen) minutes as needed.   melatonin 5 MG TABS Take 5 mg by mouth at bedtime.   Multiple Vitamins-Minerals (THEREMS M PO) Take 1 tablet by mouth daily. (multivitamin with folic acid) tablet; 400 mcg;   NON FORMULARY Diet - Regular   nystatin cream (MYCOSTATIN) Apply 1 Application topically 2 (two) times daily as needed (vaginal itching or pain).   omeprazole (PRILOSEC) 40 MG capsule Take 1 capsule (40 mg total) by mouth in the morning and at bedtime.   Phenylephrine-Cocoa Butter 0.25-85.5 % SUPP Place 1 suppository rectally 2 (two) times daily as needed.   polyethylene glycol (MIRALAX / GLYCOLAX) 17 g packet Take 17 g by mouth daily.   rOPINIRole (REQUIP) 1 MG tablet Take 1 mg by mouth at bedtime.   rosuvastatin (CRESTOR) 20 MG tablet Take 20 mg by mouth every evening.   tiZANidine (ZANAFLEX) 2 MG tablet Take 2 mg by mouth every 6 (six) hours as needed for muscle spasms (for back and sciatic pain).   zinc oxide 20 % ointment Apply 1 Application topically See admin instructions. Apply every shift to bilateral buttocks, sacrum, and coccyx   No facility-administered encounter medications on file as of 02/17/2023.     SIGNIFICANT DIAGNOSTIC EXAMS  PREVIOUS   04-15-22; dexa: t score -3.391  NO NEW EXAMS     LABS REVIEWED PREVIOUS   02-11-22: urine culture: multiple bacteria  04-09-22: liver normal protein 6.0 albumin 3.3; chol 143; ldl 81; trig 71; hdl 48; tsh 3.502  04-27-22; wbc 6.6; hgb 8.7; hct 26.2; mcv 94.6 plt 231; glucose 152; bun 66; creat 1.26; k+ 4.7; na++ 132; ca 8.9; gfr 42; protein 6.7 albumin 3.4 hgb a1c 7.0; vitamin D  37.96 07-09-22: wbc 9.3; hgb 8.5; hct 26.9; mcv 96.8 plt 196; glucose 200; bun 65; creat 2.15; k+ 5.0; na++ 128; ca 7.8; gfr 22 07-10-22: glucose 130; bun 59; creat 1.75; k+ 4.2; na++ 131; ca 7.6; gfr 29 07-30-22: urine micro-albumin 59.7; ACR 127   08-20-22: hgb A1c 8.8 10-01-22: wbc 5.6; hgb 9.6; hct 29.9; mcv 94.9 plt 245; glucose 404; bun 43; creat 1.69; k+ 5.2; na++ 135; ca 8.2 gfr 30 protein 7.2 albumin 3.5 10-19-22: glucose 176; bun 45; creat 1.63; k+ 5.8; na++ 134; ca 8.9; gfr 31 10-31-22: wbc 4.6; hgb 8.4; hct 26.5; mcv 94.0 plt 183; glucose 93; bun 46; creat 1.64; k+ 5.2; na++ 134; ca 8.6; gfr 31; protein 6.5 albumin 3.2  11-05-22: glucose 117; bun 35; creat 1.25; k+ 4.3;na++ 137; ca 7.5; gfr 43; hgb A1c 8.5; vitamin D 26.12; chol 149; ldl 73 trig 166; hdl 43  01-25-23: wbc 4.5; hgb 8.8; hct 27.0; mcv 94.7 plt 178; glucose 163; bun 45; creat 1.58; k+ 5.8; na++ 134; ca 8.7; gfr 32; protein 6.6 albumin 3.4 iron 54; tibc 305; vitamin B12: 496; keppra 80.7 (normal 10-40) 01-26-23: glucose 116; bun 41; creat 1.54; k+ 5.5; na++ 133; ca 8.5 gfr 33    TODAY  02-08-23: keppra 41.6 (10-40)  Review of Systems  Constitutional:  Negative for malaise/fatigue.  Respiratory:  Negative for cough and shortness of breath.   Cardiovascular:  Negative for chest pain, palpitations and leg swelling.  Gastrointestinal:  Negative for abdominal pain, constipation and heartburn.  Musculoskeletal:  Negative for back pain, joint pain and myalgias.  Skin: Negative.   Neurological:  Negative for dizziness.  Psychiatric/Behavioral:  The patient is not nervous/anxious.     Physical Exam Constitutional:      General:  She is not in acute distress.    Appearance: She is well-developed. She is obese. She is not diaphoretic.  Neck:     Thyroid: No thyromegaly.  Cardiovascular:     Rate and Rhythm: Normal rate and regular rhythm.     Pulses: Normal pulses.     Heart sounds: Normal heart sounds.  Pulmonary:     Effort:  Pulmonary effort is normal. No respiratory distress.     Breath sounds: Normal breath sounds.  Abdominal:     General: Bowel sounds are normal. There is no distension.     Palpations: Abdomen is soft.     Tenderness: There is no abdominal tenderness.  Genitourinary:    Comments: foley Musculoskeletal:     Cervical back: Neck supple.     Right lower leg: No edema.     Left lower leg: No edema.     Comments: Does not move lower extremities   Lymphadenopathy:     Cervical: No cervical adenopathy.  Skin:    General: Skin is warm and dry.  Neurological:     Mental Status: She is alert and oriented to person, place, and time.  Psychiatric:        Mood and Affect: Mood normal.       ASSESSMENT/ PLAN:  TODAY  CKD stage 4 due to type 2 diabetes mellitus: bun 35; creat 1.25 gfr 43  2. Anemia associated with type 2 diabetes mellitus due to underlying condition: hgb 8.5 will monitor   3. Urine retention/neurogenic bladder: has long term foley; followed by urology   PREVIOUS   4. Chronic anxiety: is currently off medications.   5. Protein calorie malnutrition severe: albumin 3.4; protein 6.7 will continue supplements as directed  6. Aortic atherosclerosis (ct 01-26-21) is on asa and statin  7. Central cord syndrome at C4 level of cervical spine compression subsequent encounter/cord compression quadriplegia  C1-4 incomplete (01-25-21); asa 81 mg daily has zanaflex 2 mg every 6 hours as needed for spasticity  8. Restless leg syndrome: will continue requip 1 mg nightly   9. Hypertension associated with stage 4 chronic kidney disease due to type 2 diabetes mellitus: b/p 137/74 will continue norvasc 5 mg daily   10. Seizure: is stable will continue keppra 750 mg twice daily  has prn ativan and is followed by neurology  11. Type 2 diabetes mellitus with diabetic neuropathy with long term current use of insulin: hgb A1c 8.5 will continue lantus 25 units nightly novolog 5 unit with meals;  farxiga 5 mg daily   12. Hyperlipidemia associated with type 2 diabetes mellitus: ldl 73  continue crestor 20 mg daily   13. Post menopausal osteoporosis t score -3.391 will continue calcium   14. GERD without esophagitis: will continue prilosec 40 mg twice daily is off reglan is followed by GI  15. Chronic constipation: has BM nearly every day; will continue miralax daily and linzess 72 mcg daily   16. Lumbar radicular syndrome/vascular necrosis bilateral hips: will continue tylenol 650 mg every 8 hours; gabapentin 300 mg three times daily and cymbalta 20 mg daily   17. Diabetic peripheral neuropathy: will continue gabapentin 300 mg three times daily    Will check hgb A1c , tsh    Synthia Innocent NP [facility]   call [phone]

## 2023-02-18 ENCOUNTER — Other Ambulatory Visit (HOSPITAL_COMMUNITY)
Admission: RE | Admit: 2023-02-18 | Discharge: 2023-02-18 | Disposition: A | Discharge status: Home / Self Care | Payer: Medicare HMO | Source: Skilled Nursing Facility | Attending: Adult Health | Admitting: Adult Health

## 2023-02-18 DIAGNOSIS — K5792 Diverticulitis of intestine, part unspecified, without perforation or abscess without bleeding: Secondary | ICD-10-CM | POA: Diagnosis present

## 2023-02-18 DIAGNOSIS — I700 Atherosclerosis of aorta: Secondary | ICD-10-CM | POA: Diagnosis present

## 2023-02-18 DIAGNOSIS — S14124S Central cord syndrome at C4 level of cervical spinal cord, sequela: Secondary | ICD-10-CM | POA: Diagnosis not present

## 2023-02-18 DIAGNOSIS — R339 Retention of urine, unspecified: Secondary | ICD-10-CM | POA: Diagnosis not present

## 2023-02-18 DIAGNOSIS — E669 Obesity, unspecified: Secondary | ICD-10-CM | POA: Diagnosis present

## 2023-02-18 DIAGNOSIS — K5730 Diverticulosis of large intestine without perforation or abscess without bleeding: Secondary | ICD-10-CM | POA: Diagnosis not present

## 2023-02-18 DIAGNOSIS — E1159 Type 2 diabetes mellitus with other circulatory complications: Secondary | ICD-10-CM | POA: Insufficient documentation

## 2023-02-18 DIAGNOSIS — R569 Unspecified convulsions: Secondary | ICD-10-CM | POA: Diagnosis not present

## 2023-02-18 DIAGNOSIS — E039 Hypothyroidism, unspecified: Secondary | ICD-10-CM | POA: Diagnosis present

## 2023-02-18 DIAGNOSIS — F419 Anxiety disorder, unspecified: Secondary | ICD-10-CM | POA: Diagnosis present

## 2023-02-18 DIAGNOSIS — R310 Gross hematuria: Secondary | ICD-10-CM | POA: Diagnosis not present

## 2023-02-18 DIAGNOSIS — E875 Hyperkalemia: Secondary | ICD-10-CM | POA: Diagnosis present

## 2023-02-18 DIAGNOSIS — M109 Gout, unspecified: Secondary | ICD-10-CM | POA: Diagnosis present

## 2023-02-18 DIAGNOSIS — R319 Hematuria, unspecified: Secondary | ICD-10-CM | POA: Diagnosis not present

## 2023-02-18 DIAGNOSIS — N3091 Cystitis, unspecified with hematuria: Secondary | ICD-10-CM | POA: Diagnosis present

## 2023-02-18 DIAGNOSIS — N1832 Chronic kidney disease, stage 3b: Secondary | ICD-10-CM | POA: Diagnosis present

## 2023-02-18 DIAGNOSIS — K5732 Diverticulitis of large intestine without perforation or abscess without bleeding: Secondary | ICD-10-CM | POA: Diagnosis present

## 2023-02-18 DIAGNOSIS — Z862 Personal history of diseases of the blood and blood-forming organs and certain disorders involving the immune mechanism: Secondary | ICD-10-CM | POA: Diagnosis not present

## 2023-02-18 DIAGNOSIS — I129 Hypertensive chronic kidney disease with stage 1 through stage 4 chronic kidney disease, or unspecified chronic kidney disease: Secondary | ICD-10-CM | POA: Diagnosis present

## 2023-02-18 DIAGNOSIS — D509 Iron deficiency anemia, unspecified: Secondary | ICD-10-CM | POA: Diagnosis present

## 2023-02-18 DIAGNOSIS — N390 Urinary tract infection, site not specified: Secondary | ICD-10-CM | POA: Diagnosis not present

## 2023-02-18 DIAGNOSIS — Y846 Urinary catheterization as the cause of abnormal reaction of the patient, or of later complication, without mention of misadventure at the time of the procedure: Secondary | ICD-10-CM | POA: Diagnosis present

## 2023-02-18 DIAGNOSIS — E7800 Pure hypercholesterolemia, unspecified: Secondary | ICD-10-CM | POA: Diagnosis present

## 2023-02-18 DIAGNOSIS — N319 Neuromuscular dysfunction of bladder, unspecified: Secondary | ICD-10-CM | POA: Diagnosis present

## 2023-02-18 DIAGNOSIS — I10 Essential (primary) hypertension: Secondary | ICD-10-CM | POA: Diagnosis not present

## 2023-02-18 DIAGNOSIS — G8252 Quadriplegia, C1-C4 incomplete: Secondary | ICD-10-CM | POA: Diagnosis present

## 2023-02-18 DIAGNOSIS — Z66 Do not resuscitate: Secondary | ICD-10-CM | POA: Diagnosis present

## 2023-02-18 DIAGNOSIS — D62 Acute posthemorrhagic anemia: Secondary | ICD-10-CM | POA: Diagnosis not present

## 2023-02-18 DIAGNOSIS — Z794 Long term (current) use of insulin: Secondary | ICD-10-CM | POA: Diagnosis not present

## 2023-02-18 DIAGNOSIS — E1122 Type 2 diabetes mellitus with diabetic chronic kidney disease: Secondary | ICD-10-CM | POA: Diagnosis present

## 2023-02-18 DIAGNOSIS — I959 Hypotension, unspecified: Secondary | ICD-10-CM | POA: Diagnosis not present

## 2023-02-18 DIAGNOSIS — N3001 Acute cystitis with hematuria: Secondary | ICD-10-CM | POA: Diagnosis not present

## 2023-02-18 DIAGNOSIS — T83518A Infection and inflammatory reaction due to other urinary catheter, initial encounter: Secondary | ICD-10-CM | POA: Diagnosis present

## 2023-02-18 DIAGNOSIS — E1142 Type 2 diabetes mellitus with diabetic polyneuropathy: Secondary | ICD-10-CM | POA: Diagnosis present

## 2023-02-18 DIAGNOSIS — N184 Chronic kidney disease, stage 4 (severe): Secondary | ICD-10-CM | POA: Diagnosis not present

## 2023-02-18 DIAGNOSIS — R109 Unspecified abdominal pain: Secondary | ICD-10-CM | POA: Diagnosis not present

## 2023-02-18 DIAGNOSIS — G2581 Restless legs syndrome: Secondary | ICD-10-CM | POA: Diagnosis present

## 2023-02-18 DIAGNOSIS — E872 Acidosis, unspecified: Secondary | ICD-10-CM | POA: Diagnosis present

## 2023-02-18 DIAGNOSIS — Z7401 Bed confinement status: Secondary | ICD-10-CM | POA: Diagnosis not present

## 2023-02-18 DIAGNOSIS — F32A Depression, unspecified: Secondary | ICD-10-CM | POA: Diagnosis present

## 2023-02-18 DIAGNOSIS — G40909 Epilepsy, unspecified, not intractable, without status epilepticus: Secondary | ICD-10-CM | POA: Diagnosis present

## 2023-02-18 DIAGNOSIS — N189 Chronic kidney disease, unspecified: Secondary | ICD-10-CM | POA: Diagnosis not present

## 2023-02-18 LAB — HEMOGLOBIN A1C
Hgb A1c MFr Bld: 7.5 % — ABNORMAL HIGH (ref 4.8–5.6)
Mean Plasma Glucose: 168.55 mg/dL

## 2023-02-18 LAB — TSH: TSH: 5.113 u[IU]/mL — ABNORMAL HIGH (ref 0.350–4.500)

## 2023-02-19 ENCOUNTER — Inpatient Hospital Stay (HOSPITAL_COMMUNITY)
Admission: EM | Admit: 2023-02-19 | Discharge: 2023-02-24 | DRG: 698 | Disposition: A | Discharge status: Home / Self Care | Payer: Medicare HMO | Source: Skilled Nursing Facility | Attending: Internal Medicine | Admitting: Internal Medicine

## 2023-02-19 ENCOUNTER — Non-Acute Institutional Stay (SKILLED_NURSING_FACILITY): Payer: Medicare HMO | Admitting: Internal Medicine

## 2023-02-19 ENCOUNTER — Encounter (HOSPITAL_COMMUNITY): Payer: Self-pay

## 2023-02-19 ENCOUNTER — Other Ambulatory Visit (HOSPITAL_COMMUNITY)
Admission: RE | Admit: 2023-02-19 | Discharge: 2023-02-19 | Disposition: A | Discharge status: Home / Self Care | Payer: Medicare HMO | Source: Skilled Nursing Facility | Attending: Internal Medicine | Admitting: Internal Medicine

## 2023-02-19 ENCOUNTER — Other Ambulatory Visit: Payer: Self-pay

## 2023-02-19 ENCOUNTER — Emergency Department (HOSPITAL_COMMUNITY): Payer: Medicare HMO

## 2023-02-19 ENCOUNTER — Encounter: Payer: Self-pay | Admitting: Internal Medicine

## 2023-02-19 DIAGNOSIS — G40909 Epilepsy, unspecified, not intractable, without status epilepticus: Secondary | ICD-10-CM | POA: Diagnosis present

## 2023-02-19 DIAGNOSIS — E7800 Pure hypercholesterolemia, unspecified: Secondary | ICD-10-CM | POA: Diagnosis present

## 2023-02-19 DIAGNOSIS — Z981 Arthrodesis status: Secondary | ICD-10-CM

## 2023-02-19 DIAGNOSIS — N184 Chronic kidney disease, stage 4 (severe): Secondary | ICD-10-CM | POA: Diagnosis not present

## 2023-02-19 DIAGNOSIS — E875 Hyperkalemia: Secondary | ICD-10-CM | POA: Diagnosis present

## 2023-02-19 DIAGNOSIS — Y846 Urinary catheterization as the cause of abnormal reaction of the patient, or of later complication, without mention of misadventure at the time of the procedure: Secondary | ICD-10-CM | POA: Diagnosis present

## 2023-02-19 DIAGNOSIS — K5730 Diverticulosis of large intestine without perforation or abscess without bleeding: Secondary | ICD-10-CM | POA: Diagnosis not present

## 2023-02-19 DIAGNOSIS — Z79899 Other long term (current) drug therapy: Secondary | ICD-10-CM

## 2023-02-19 DIAGNOSIS — Z9049 Acquired absence of other specified parts of digestive tract: Secondary | ICD-10-CM

## 2023-02-19 DIAGNOSIS — D62 Acute posthemorrhagic anemia: Secondary | ICD-10-CM

## 2023-02-19 DIAGNOSIS — R310 Gross hematuria: Secondary | ICD-10-CM | POA: Diagnosis not present

## 2023-02-19 DIAGNOSIS — K5792 Diverticulitis of intestine, part unspecified, without perforation or abscess without bleeding: Secondary | ICD-10-CM

## 2023-02-19 DIAGNOSIS — F419 Anxiety disorder, unspecified: Secondary | ICD-10-CM | POA: Diagnosis present

## 2023-02-19 DIAGNOSIS — F32A Depression, unspecified: Secondary | ICD-10-CM | POA: Diagnosis present

## 2023-02-19 DIAGNOSIS — G2581 Restless legs syndrome: Secondary | ICD-10-CM | POA: Diagnosis present

## 2023-02-19 DIAGNOSIS — N1832 Chronic kidney disease, stage 3b: Secondary | ICD-10-CM | POA: Diagnosis present

## 2023-02-19 DIAGNOSIS — T83518A Infection and inflammatory reaction due to other urinary catheter, initial encounter: Principal | ICD-10-CM | POA: Diagnosis present

## 2023-02-19 DIAGNOSIS — K5732 Diverticulitis of large intestine without perforation or abscess without bleeding: Secondary | ICD-10-CM | POA: Diagnosis present

## 2023-02-19 DIAGNOSIS — I129 Hypertensive chronic kidney disease with stage 1 through stage 4 chronic kidney disease, or unspecified chronic kidney disease: Secondary | ICD-10-CM | POA: Diagnosis present

## 2023-02-19 DIAGNOSIS — N319 Neuromuscular dysfunction of bladder, unspecified: Secondary | ICD-10-CM | POA: Diagnosis present

## 2023-02-19 DIAGNOSIS — R319 Hematuria, unspecified: Secondary | ICD-10-CM | POA: Diagnosis not present

## 2023-02-19 DIAGNOSIS — E1122 Type 2 diabetes mellitus with diabetic chronic kidney disease: Secondary | ICD-10-CM | POA: Diagnosis present

## 2023-02-19 DIAGNOSIS — Z87820 Personal history of traumatic brain injury: Secondary | ICD-10-CM

## 2023-02-19 DIAGNOSIS — Z66 Do not resuscitate: Secondary | ICD-10-CM | POA: Diagnosis present

## 2023-02-19 DIAGNOSIS — E039 Hypothyroidism, unspecified: Secondary | ICD-10-CM | POA: Diagnosis present

## 2023-02-19 DIAGNOSIS — Z8249 Family history of ischemic heart disease and other diseases of the circulatory system: Secondary | ICD-10-CM

## 2023-02-19 DIAGNOSIS — D509 Iron deficiency anemia, unspecified: Secondary | ICD-10-CM | POA: Diagnosis present

## 2023-02-19 DIAGNOSIS — R109 Unspecified abdominal pain: Secondary | ICD-10-CM | POA: Diagnosis not present

## 2023-02-19 DIAGNOSIS — Z888 Allergy status to other drugs, medicaments and biological substances status: Secondary | ICD-10-CM

## 2023-02-19 DIAGNOSIS — Z794 Long term (current) use of insulin: Secondary | ICD-10-CM

## 2023-02-19 DIAGNOSIS — E1142 Type 2 diabetes mellitus with diabetic polyneuropathy: Secondary | ICD-10-CM | POA: Diagnosis present

## 2023-02-19 DIAGNOSIS — S14124S Central cord syndrome at C4 level of cervical spinal cord, sequela: Secondary | ICD-10-CM

## 2023-02-19 DIAGNOSIS — G8252 Quadriplegia, C1-C4 incomplete: Secondary | ICD-10-CM | POA: Diagnosis present

## 2023-02-19 DIAGNOSIS — N189 Chronic kidney disease, unspecified: Secondary | ICD-10-CM | POA: Diagnosis not present

## 2023-02-19 DIAGNOSIS — I700 Atherosclerosis of aorta: Secondary | ICD-10-CM | POA: Diagnosis present

## 2023-02-19 DIAGNOSIS — K219 Gastro-esophageal reflux disease without esophagitis: Secondary | ICD-10-CM | POA: Diagnosis present

## 2023-02-19 DIAGNOSIS — N3091 Cystitis, unspecified with hematuria: Secondary | ICD-10-CM | POA: Diagnosis present

## 2023-02-19 DIAGNOSIS — E872 Acidosis, unspecified: Secondary | ICD-10-CM | POA: Diagnosis present

## 2023-02-19 DIAGNOSIS — N3001 Acute cystitis with hematuria: Secondary | ICD-10-CM | POA: Diagnosis not present

## 2023-02-19 DIAGNOSIS — Z882 Allergy status to sulfonamides status: Secondary | ICD-10-CM

## 2023-02-19 DIAGNOSIS — R339 Retention of urine, unspecified: Secondary | ICD-10-CM | POA: Diagnosis not present

## 2023-02-19 DIAGNOSIS — Z885 Allergy status to narcotic agent status: Secondary | ICD-10-CM

## 2023-02-19 DIAGNOSIS — E669 Obesity, unspecified: Secondary | ICD-10-CM | POA: Diagnosis present

## 2023-02-19 DIAGNOSIS — Z7982 Long term (current) use of aspirin: Secondary | ICD-10-CM

## 2023-02-19 DIAGNOSIS — M109 Gout, unspecified: Secondary | ICD-10-CM | POA: Diagnosis present

## 2023-02-19 DIAGNOSIS — Z6830 Body mass index (BMI) 30.0-30.9, adult: Secondary | ICD-10-CM

## 2023-02-19 DIAGNOSIS — R234 Changes in skin texture: Secondary | ICD-10-CM | POA: Diagnosis present

## 2023-02-19 DIAGNOSIS — N390 Urinary tract infection, site not specified: Secondary | ICD-10-CM | POA: Diagnosis present

## 2023-02-19 DIAGNOSIS — R569 Unspecified convulsions: Secondary | ICD-10-CM | POA: Diagnosis not present

## 2023-02-19 DIAGNOSIS — Z862 Personal history of diseases of the blood and blood-forming organs and certain disorders involving the immune mechanism: Secondary | ICD-10-CM | POA: Diagnosis not present

## 2023-02-19 LAB — URINALYSIS, W/ REFLEX TO CULTURE (INFECTION SUSPECTED)
Bilirubin Urine: NEGATIVE
Glucose, UA: 150 mg/dL — AB
Ketones, ur: NEGATIVE mg/dL
Nitrite: NEGATIVE
Protein, ur: 100 mg/dL — AB
RBC / HPF: >50 RBC/hpf (ref 0–5)
RBC / HPF: >50 RBC/hpf (ref 0–5)
Specific Gravity, Urine: 1.008 (ref 1.005–1.030)
Squamous Epithelial / HPF: NONE SEEN /HPF (ref 0–5)
pH: 6 (ref 5.0–8.0)

## 2023-02-19 LAB — CBC WITH DIFFERENTIAL/PLATELET
Abs Immature Granulocytes: 0.02 10*3/uL (ref 0.00–0.07)
Basophils Absolute: 0 10*3/uL (ref 0.0–0.1)
Basophils Relative: 0 %
Eosinophils Absolute: 0.4 10*3/uL (ref 0.0–0.5)
Eosinophils Relative: 7 %
HCT: 24.7 % — ABNORMAL LOW (ref 36.0–46.0)
Hemoglobin: 7.9 g/dL — ABNORMAL LOW (ref 12.0–15.0)
Immature Granulocytes: 0 %
Lymphocytes Relative: 34 %
Lymphs Abs: 2 10*3/uL (ref 0.7–4.0)
MCH: 30.6 pg (ref 26.0–34.0)
MCHC: 32 g/dL (ref 30.0–36.0)
MCV: 95.7 fL (ref 80.0–100.0)
Monocytes Absolute: 0.4 10*3/uL (ref 0.1–1.0)
Monocytes Relative: 6 %
Neutro Abs: 3.2 10*3/uL (ref 1.7–7.7)
Neutrophils Relative %: 53 %
Platelets: 202 10*3/uL (ref 150–400)
RBC: 2.58 MIL/uL — ABNORMAL LOW (ref 3.87–5.11)
RDW: 13.9 % (ref 11.5–15.5)
WBC: 6 10*3/uL (ref 4.0–10.5)
nRBC: 0 % (ref 0.0–0.2)

## 2023-02-19 LAB — POTASSIUM: Potassium: 5.4 mmol/L — ABNORMAL HIGH (ref 3.5–5.1)

## 2023-02-19 LAB — BASIC METABOLIC PANEL
Anion gap: 9 (ref 5–15)
BUN: 45 mg/dL — ABNORMAL HIGH (ref 8–23)
CO2: 18 mmol/L — ABNORMAL LOW (ref 22–32)
Calcium: 8.7 mg/dL — ABNORMAL LOW (ref 8.9–10.3)
Chloride: 103 mmol/L (ref 98–111)
Creatinine, Ser: 1.76 mg/dL — ABNORMAL HIGH (ref 0.44–1.00)
GFR, Estimated: 28 mL/min — ABNORMAL LOW (ref >60)
Glucose, Bld: 225 mg/dL — ABNORMAL HIGH (ref 70–99)
Potassium: 6.4 mmol/L (ref 3.5–5.1)
Sodium: 130 mmol/L — ABNORMAL LOW (ref 135–145)

## 2023-02-19 LAB — URINALYSIS, ROUTINE W REFLEX MICROSCOPIC
Bilirubin Urine: NEGATIVE
Glucose, UA: >=500 mg/dL — AB
Ketones, ur: NEGATIVE mg/dL
Nitrite: NEGATIVE
Protein, ur: 100 mg/dL — AB
RBC / HPF: >50 RBC/hpf (ref 0–5)
Specific Gravity, Urine: 1.007 (ref 1.005–1.030)
WBC, UA: >50 WBC/hpf (ref 0–5)
pH: 6 (ref 5.0–8.0)

## 2023-02-19 LAB — CBG MONITORING, ED
Glucose-Capillary: 200 mg/dL — ABNORMAL HIGH (ref 70–99)
Glucose-Capillary: 201 mg/dL — ABNORMAL HIGH (ref 70–99)

## 2023-02-19 LAB — PROTIME-INR
INR: 1 (ref 0.8–1.2)
Prothrombin Time: 13.2 seconds (ref 11.4–15.2)

## 2023-02-19 MED ORDER — PANTOPRAZOLE SODIUM 40 MG PO TBEC
40.0000 mg | DELAYED_RELEASE_TABLET | Freq: Every day | ORAL | Status: DC
  Administered 2023-02-20 – 2023-02-24 (×5): 40 mg via ORAL
  Filled 2023-02-19 (×5): qty 1

## 2023-02-19 MED ORDER — PIPERACILLIN-TAZOBACTAM 3.375 G IVPB 30 MIN
3.3750 g | Freq: Once | INTRAVENOUS | Status: AC
  Administered 2023-02-19: 3.375 g via INTRAVENOUS
  Filled 2023-02-19: qty 50

## 2023-02-19 MED ORDER — PIPERACILLIN-TAZOBACTAM 3.375 G IVPB 30 MIN: 3.3750 g | Freq: Four times a day (QID) | INTRAVENOUS | Status: DC

## 2023-02-19 MED ORDER — SODIUM CHLORIDE 0.9 % IV BOLUS
500.0000 mL | Freq: Once | INTRAVENOUS | Status: AC
  Administered 2023-02-19: 500 mL via INTRAVENOUS

## 2023-02-19 MED ORDER — ENSURE ENLIVE PO LIQD
237.0000 mL | Freq: Every day | ORAL | Status: DC
  Administered 2023-02-20 – 2023-02-23 (×4): 237 mL via ORAL
  Filled 2023-02-19: qty 237

## 2023-02-19 MED ORDER — DEXTROSE 50 % IV SOLN
1.0000 | Freq: Once | INTRAVENOUS | Status: AC
  Administered 2023-02-19: 50 mL via INTRAVENOUS
  Filled 2023-02-19: qty 50

## 2023-02-19 MED ORDER — SODIUM CHLORIDE 0.9 % IV SOLN
1.0000 g | Freq: Once | INTRAVENOUS | Status: AC
  Administered 2023-02-19: 1 g via INTRAVENOUS
  Filled 2023-02-19: qty 10

## 2023-02-19 MED ORDER — IPRATROPIUM-ALBUTEROL 0.5-2.5 (3) MG/3ML IN SOLN: 3.0000 mL | Freq: Four times a day (QID) | RESPIRATORY_TRACT | Status: DC | PRN

## 2023-02-19 MED ORDER — ROSUVASTATIN CALCIUM 20 MG PO TABS
20.0000 mg | ORAL_TABLET | Freq: Every evening | ORAL | Status: DC
  Administered 2023-02-20 – 2023-02-24 (×5): 20 mg via ORAL
  Filled 2023-02-19 (×5): qty 1

## 2023-02-19 MED ORDER — ONDANSETRON HCL 4 MG/2ML IJ SOLN
4.0000 mg | Freq: Four times a day (QID) | INTRAMUSCULAR | Status: DC | PRN
  Administered 2023-02-20: 4 mg via INTRAVENOUS
  Filled 2023-02-19: qty 2

## 2023-02-19 MED ORDER — STERILE WATER FOR INJECTION IJ SOLN
INTRAMUSCULAR | Status: AC
  Filled 2023-02-19: qty 10

## 2023-02-19 MED ORDER — LORAZEPAM 2 MG/ML IJ SOLN
2.0000 mg | INTRAMUSCULAR | Status: DC | PRN
  Administered 2023-02-21: 2 mg via INTRAVENOUS
  Filled 2023-02-19: qty 1

## 2023-02-19 MED ORDER — FENTANYL CITRATE PF 50 MCG/ML IJ SOSY
25.0000 ug | PREFILLED_SYRINGE | Freq: Once | INTRAMUSCULAR | Status: AC
  Administered 2023-02-19: 25 ug via INTRAVENOUS
  Filled 2023-02-19: qty 1

## 2023-02-19 MED ORDER — INSULIN ASPART 100 UNIT/ML IJ SOLN
0.0000 [IU] | Freq: Three times a day (TID) | INTRAMUSCULAR | Status: DC
  Administered 2023-02-20 (×3): 3 [IU] via SUBCUTANEOUS
  Administered 2023-02-21: 2 [IU] via SUBCUTANEOUS
  Administered 2023-02-21 – 2023-02-23 (×7): 3 [IU] via SUBCUTANEOUS
  Administered 2023-02-23: 5 [IU] via SUBCUTANEOUS

## 2023-02-19 MED ORDER — ONDANSETRON HCL 4 MG PO TABS: 4.0000 mg | ORAL_TABLET | Freq: Four times a day (QID) | ORAL | Status: DC | PRN

## 2023-02-19 MED ORDER — SODIUM ZIRCONIUM CYCLOSILICATE 5 G PO PACK
10.0000 g | PACK | Freq: Once | ORAL | Status: AC
  Administered 2023-02-19: 10 g via ORAL
  Filled 2023-02-19: qty 2

## 2023-02-19 MED ORDER — ROPINIROLE HCL 1 MG PO TABS
1.0000 mg | ORAL_TABLET | Freq: Every day | ORAL | Status: DC
  Administered 2023-02-20 – 2023-02-22 (×4): 1 mg via ORAL
  Filled 2023-02-19 (×4): qty 1

## 2023-02-19 MED ORDER — ACETAMINOPHEN 325 MG PO TABS
650.0000 mg | ORAL_TABLET | Freq: Three times a day (TID) | ORAL | Status: DC
  Administered 2023-02-20 – 2023-02-24 (×15): 650 mg via ORAL
  Filled 2023-02-19 (×15): qty 2

## 2023-02-19 MED ORDER — FENTANYL CITRATE PF 50 MCG/ML IJ SOSY
50.0000 ug | PREFILLED_SYRINGE | Freq: Once | INTRAMUSCULAR | Status: AC
  Administered 2023-02-19: 50 ug via INTRAVENOUS
  Filled 2023-02-19: qty 1

## 2023-02-19 MED ORDER — INSULIN GLARGINE-YFGN 100 UNIT/ML ~~LOC~~ SOLN
15.0000 [IU] | Freq: Every day | SUBCUTANEOUS | Status: DC
  Administered 2023-02-20 – 2023-02-24 (×5): 15 [IU] via SUBCUTANEOUS
  Filled 2023-02-19 (×8): qty 0.15

## 2023-02-19 MED ORDER — MORPHINE SULFATE (PF) 4 MG/ML IV SOLN
4.0000 mg | Freq: Once | INTRAVENOUS | Status: AC
  Administered 2023-02-19: 4 mg via INTRAVENOUS
  Filled 2023-02-19: qty 1

## 2023-02-19 MED ORDER — LEVETIRACETAM 750 MG PO TABS
750.0000 mg | ORAL_TABLET | Freq: Two times a day (BID) | ORAL | Status: DC
  Administered 2023-02-20 – 2023-02-24 (×10): 750 mg via ORAL
  Filled 2023-02-19 (×12): qty 1

## 2023-02-19 MED ORDER — DAPAGLIFLOZIN PROPANEDIOL 5 MG PO TABS
5.0000 mg | ORAL_TABLET | Freq: Every day | ORAL | Status: DC
  Administered 2023-02-20 – 2023-02-24 (×5): 5 mg via ORAL
  Filled 2023-02-19 (×5): qty 1

## 2023-02-19 MED ORDER — OYSTER SHELL CALCIUM/D3 500-5 MG-MCG PO TABS
1.0000 | ORAL_TABLET | Freq: Two times a day (BID) | ORAL | Status: DC
  Administered 2023-02-20 – 2023-02-24 (×10): 1 via ORAL
  Filled 2023-02-19 (×12): qty 1

## 2023-02-19 MED ORDER — INSULIN ASPART 100 UNIT/ML IV SOLN
5.0000 [IU] | Freq: Once | INTRAVENOUS | Status: AC
  Administered 2023-02-19: 5 [IU] via INTRAVENOUS

## 2023-02-19 MED ORDER — SODIUM CHLORIDE 0.9 % IV SOLN: INTRAVENOUS | Status: AC

## 2023-02-19 MED ORDER — DULOXETINE HCL 20 MG PO CPEP
20.0000 mg | ORAL_CAPSULE | Freq: Every day | ORAL | Status: DC
  Administered 2023-02-20 – 2023-02-24 (×5): 20 mg via ORAL
  Filled 2023-02-19 (×5): qty 1

## 2023-02-19 MED ORDER — GABAPENTIN 300 MG PO CAPS
300.0000 mg | ORAL_CAPSULE | Freq: Three times a day (TID) | ORAL | Status: DC
  Administered 2023-02-20 – 2023-02-24 (×15): 300 mg via ORAL
  Filled 2023-02-19 (×15): qty 1

## 2023-02-19 NOTE — ED Notes (Signed)
Notified Dr. Deretha Emory and Park Meo that the catheter has blood surrounding the urethra, so not able to change to a new catheter.

## 2023-02-19 NOTE — Assessment & Plan Note (Addendum)
On 02/01/2023 creatinine 1.62 and GFR 31 indicating low stage IIIb CKD.  Since November 2023 low creatinine has been 1.25 and peak 1.75.  During the same period of time the nadir GFR was 22 and GFR 43.  Med list reviewed; no indication for change in medications unless there is progression of CKD. 02/18/2023 A1c 7.5% indicating adequate control.

## 2023-02-19 NOTE — ED Provider Notes (Signed)
Patient transported from [facility] for CT scan to further evaluate for infectious process.  Appears that she has a catheter associated UTI.  CT scan was broken any pain and sent for further evaluation.  Has been having left lower abdominal pain as well.  She was treated for hyperkalemia of 6.4 on repeat here with 5.4.  Urinalysis consistent with infection.  She was originally given IV Rocephin but given that she has a catheter will give Zosyn.  CT scan also shows acute diverticulitis.  She has no white count no fever.  I do not have concern for sepsis at this time but think she benefit from admission for IV antibiotics, pain control.  This chart was dictated using voice recognition software.  Despite best efforts to proofread,  errors can occur which can change the documentation meaning.    Virgina Norfolk, DO 02/19/23 2106

## 2023-02-19 NOTE — Assessment & Plan Note (Addendum)
Nonemergency referral to ED for further urologic evaluation to define etiology of pain & active bleeding.Marland Kitchen

## 2023-02-19 NOTE — ED Provider Notes (Signed)
[place] EMERGENCY DEPARTMENT AT [facility] Provider Note   CSN: [id number] Arrival date & time: 02/19/23  1224     History  Chief Complaint  Patient presents with   Hematuria   Abdominal Pain    [name] is a 87 y.o. female. She presents to the ER c/o hematuria.    Hematuria Associated symptoms include abdominal pain.  Abdominal Pain Associated symptoms: hematuria        Home Medications Prior to Admission medications   Medication Sig Start Date End Date Taking? Authorizing Provider  acetaminophen (TYLENOL) 325 MG tablet Take 650 mg by mouth every 8 (eight) hours.    [provider]  AMBULATORY NON FORMULARY MEDICATION Medication Name:  Continue monthly catheter changes with 4fr catheter at skilled nursing facility. 08/12/21   McKenzie, Mardene Jerry Haugen, MD  amLODipine (NORVASC) 5 MG tablet Take 5 mg by mouth daily.    [provider]  aspirin EC 81 MG tablet Take 81 mg by mouth daily. Swallow whole.9 am    [provider]  augmented betamethasone dipropionate (DIPROLENE-AF) 0.05 % ointment Apply 1 Application topically 2 (two) times daily as needed. Apply to stomach and both breast    [provider]  calcium-vitamin D (OSCAL WITH D) 500-5 MG-MCG tablet Take 1 tablet by mouth daily.    [provider]  camphor-menthol Wynelle Fanny) lotion Apply 1 Application topically 2 (two) times daily as needed for itching.    [provider]  dapagliflozin propanediol (FARXIGA) 5 MG TABS tablet Take 5 mg by mouth daily.    [provider]  DULoxetine (CYMBALTA) 20 MG capsule Take 20 mg by mouth daily.    [provider]  feeding supplement (ENSURE ENLIVE / ENSURE PLUS) LIQD Take 237 mLs by mouth daily.    [provider]  fexofenadine (ALLEGRA) 60 MG tablet Take 60 mg by mouth daily.    [provider]  gabapentin (NEURONTIN) 300 MG capsule Take 300 mg by mouth 3 (three) times daily.     [provider]  insulin aspart (NOVOLOG FLEXPEN) 100 UNIT/ML FlexPen Inject 5 Units into the skin 3 (three) times daily with meals. 8 am, 12 pm, and 6 pm    [provider]  Insulin Pen Needle 30G X 5 MM MISC 1 Device by Does not apply route daily. 3/16"    Sharee Holster, NP  ipratropium-albuterol (DUONEB) 0.5-2.5 (3) MG/3ML SOLN Take 3 mLs by nebulization every 6 (six) hours as needed (wheezing).    [provider]  LANTUS SOLOSTAR 100 UNIT/ML Solostar Pen Inject 25 Units into the skin at bedtime. 09/04/21   [provider]  levETIRAcetam (KEPPRA) 750 MG tablet Take 750 mg by mouth 2 (two) times daily.    [provider]  lidocaine (LIDODERM) 5 % Place 1 patch onto the skin 2 (two) times daily as needed (pain). Apply to Right shoulder as needed once a morning and remove at bedtime    [provider]  linaclotide (LINZESS) 72 MCG capsule Take 1 capsule (72 mcg total) by mouth daily before breakfast. 10/20/22   Aida Raider, NP  LORazepam (ATIVAN) 2 MG/ML injection Inject 0.5 mLs (1 mg total) into the muscle every 15 (fifteen) minutes as needed. 12/07/22   Sharee Holster, NP  melatonin 5 MG TABS Take 5 mg by mouth at bedtime.    [provider]  Multiple Vitamins-Minerals (THEREMS M PO) Take 1 tablet by mouth daily. (multivitamin  with folic acid) tablet; 400 mcg;    [provider]  NON FORMULARY Diet - Regular    [provider]  nystatin cream (MYCOSTATIN) Apply 1 Application topically 2 (two) times daily as needed (vaginal itching or pain).    [provider]  omeprazole (PRILOSEC) 40 MG capsule Take 1 capsule (40 mg total) by mouth in the morning and at bedtime. 10/20/22   Aida Raider, NP  Phenylephrine-Cocoa Butter 0.25-85.5 % SUPP Place 1 suppository rectally 2 (two) times daily as needed.    [provider]  polyethylene glycol (MIRALAX / GLYCOLAX) 17 g packet Take 17 g by mouth daily.     [provider]  rOPINIRole (REQUIP) 1 MG tablet Take 1 mg by mouth at bedtime.    [provider]  rosuvastatin (CRESTOR) 20 MG tablet Take 20 mg by mouth every evening.    [provider]  tiZANidine (ZANAFLEX) 2 MG tablet Take 2 mg by mouth every 6 (six) hours as needed for muscle spasms (for back and sciatic pain).    [provider]  zinc oxide 20 % ointment Apply 1 Application topically See admin instructions. Apply every shift to bilateral buttocks, sacrum, and coccyx    [provider]      Allergies    Ace inhibitors, Codeine, and Sulfa antibiotics    Review of Systems   Review of Systems  Gastrointestinal:  Positive for abdominal pain.  Genitourinary:  Positive for hematuria.    Physical Exam Updated Vital Signs BP (!) 134/51   Pulse 70   Temp 98.4 F (36.9 C)   Resp 18   Ht 5\' 2"  (1.575 m)   Wt 76.2 kg   SpO2 98%   BMI 30.73 kg/m  Physical Exam Vitals and nursing note reviewed.  Constitutional:      General: She is not in acute distress.    Appearance: She is well-developed.  HENT:     Head: Normocephalic and atraumatic.  Eyes:     Conjunctiva/sclera: Conjunctivae normal.  Cardiovascular:     Rate and Rhythm: Normal rate and regular rhythm.     Heart sounds: No murmur heard. Pulmonary:     Effort: Pulmonary effort is normal. No respiratory distress.     Breath sounds: Normal breath sounds.  Abdominal:     Palpations: Abdomen is soft.     Tenderness: There is abdominal tenderness in the suprapubic area and left lower quadrant. There is left CVA tenderness. There is no right CVA tenderness or guarding.  Genitourinary:    Vagina: No bleeding.  Musculoskeletal:        General: No swelling.     Cervical back: Neck supple.  Skin:    General: Skin is warm and dry.     Capillary Refill: Capillary refill takes less than 2 seconds.  Neurological:     Mental Status: She is alert.  Psychiatric:        Mood and  Affect: Mood normal.     ED Results / Procedures / Treatments   Labs (all labs ordered are listed, but only abnormal results are displayed) Labs Reviewed  CBC WITH DIFFERENTIAL/PLATELET - Abnormal; Notable for the following components:      Result Value   RBC 2.58 (*)    Hemoglobin 7.9 (*)    HCT 24.7 (*)    All other components within normal limits  BASIC METABOLIC PANEL - Abnormal; Notable for the following components:   Sodium 130 (*)  Potassium 6.4 (*)    CO2 18 (*)    Glucose, Bld 225 (*)    BUN 45 (*)    Creatinine, Ser 1.76 (*)    Calcium 8.7 (*)    GFR, Estimated 28 (*)    All other components within normal limits  URINALYSIS, W/ REFLEX TO CULTURE (INFECTION SUSPECTED) - Abnormal; Notable for the following components:   Color, Urine RED (*)    APPearance HAZY (*)    Glucose, UA 150 (*)    Hgb urine dipstick MODERATE (*)    Protein, ur 100 (*)    Leukocytes,Ua TRACE (*)    Bacteria, UA FEW (*)    All other components within normal limits  URINALYSIS, ROUTINE W REFLEX MICROSCOPIC - Abnormal; Notable for the following components:   Color, Urine RED (*)    APPearance CLOUDY (*)    Glucose, UA >=500 (*)    Hgb urine dipstick MODERATE (*)    Protein, ur 100 (*)    Leukocytes,Ua LARGE (*)    Bacteria, UA MANY (*)    All other components within normal limits  CBG MONITORING, ED - Abnormal; Notable for the following components:   Glucose-Capillary 200 (*)    All other components within normal limits  URINE CULTURE  PROTIME-INR    EKG EKG Interpretation  Date/Time:  Friday February 19 2023 15:47:53 EDT Ventricular Rate:  78 PR Interval:  158 QRS Duration: 104 QT Interval:  398 QTC Calculation: 454 R Axis:   67 Text Interpretation: Sinus rhythm Confirmed by Vanetta Mulders [phone]) on 02/19/2023 3:49:28 PM  Radiology No results found.  Procedures Procedures    Medications Ordered in ED Medications  cefTRIAXone (ROCEPHIN) 1 g in sodium chloride 0.9 % 100  mL IVPB (has no administration in time range)  sterile water (preservative free) injection ( Other Given 02/19/23 1458)  sodium chloride 0.9 % bolus 500 mL (500 mLs Intravenous New Bag/Given 02/19/23 1633)  sodium zirconium cyclosilicate (LOKELMA) packet 10 g (10 g Oral Given 02/19/23 1550)  insulin aspart (novoLOG) injection 5 Units (5 Units Intravenous Given 02/19/23 1600)    And  dextrose 50 % solution 50 mL (50 mLs Intravenous Given 02/19/23 1551)  fentaNYL (SUBLIMAZE) injection 25 mcg (25 mcg Intravenous Given 02/19/23 1626)    ED Course/ Medical Decision Making/ A&P                             Medical Decision Making DdX: UTI, pyelonephritis, nephrolithiasis, anemia, other ED course: Patient has been having hematuria worsening over the past several days, had some blood coming from around the urinary catheter, no GI or vaginal bleeding.  Having suprapubic and left flank pain.  She is afebrile with no other complaints, does have a UTI but given her level of pain concerned about stone versus possible pyelonephritis, patient needs CT scan,  and unfortunately our scanner is functional at [facility] at this time.  I spoke with Dr. Particia Nearing in the ED at [facility].  Accept the patient to their ED for further evaluation including CT scan.  I explained the situation to the patient and her daughter who are agreeable with transportation.  Patiently urinalysis was drawn from patient's catheter, we then replaced it, repeat urinalysis that 1613 collected still shows UTI with many bacteria greater than 50 white blood cells and greater than 50 red blood cells.  Amount and/or Complexity of Data Reviewed Labs: ordered. Radiology: ordered.  Risk OTC drugs. Prescription drug management.           Final Clinical Impression(s) / ED Diagnoses Final diagnoses:  Urinary tract infection with hematuria, site unspecified    Rx / DC Orders ED Discharge Orders     None         Josem Kaufmann 02/19/23 1648    Bethann Berkshire, MD 02/23/23 1156

## 2023-02-19 NOTE — ED Notes (Addendum)
Notified Dr. Deretha Emory and Park Meo that Lab notified a critical result for Potassium of 6.4.

## 2023-02-19 NOTE — ED Notes (Signed)
Report received from [facility]. Pt is here for a CT.

## 2023-02-19 NOTE — ED Notes (Signed)
[facility] has been called for transport, ED to ED for CT

## 2023-02-19 NOTE — Progress Notes (Signed)
NURSING HOME LOCATION:  [facility] ROOM NUMBER: [id number]  CODE STATUS: DNR  PCP:  Synthia Innocent NP  This is a nursing facility follow up visit for specific acute issue of painful gross hematuria.  Interim medical record and care since last SNF visit was updated with review of diagnostic studies and change in clinical status since last visit were documented.  HPI: Staff reports that she has been experiencing dysuria associated with frank hematuria for the last 2 days.  The Foley catheter was changed 6/19 but the hematuria has persisted.  Additionally she has been having clots around the catheter as well as when the new Foley was flushed.  The urine output has varied significantly.  Typically over 6 hours she will have 500 cc to 1000 cc.  Over the last 6 hours she has had 200 cc and it is all frank hematuria. She is also been experiencing suprapubic pain which has been an intermittent phenomenon for months according to the patient. She has CKD stage III/IV with the most recent creatinine of 1.62 and GFR 31 on 02/01/2023.  Since November 2023 the creatinine has ranged from a low of 1.25 up to high of 1.75.  During that same period the nadir GFR has been 22 in the high 43.   She has chronic normochromic, normocytic anemia with most recent values of 8.8/27 on 01/25/2023.  Since 07/09/2022 H/H has varied from 8.4/26.5 up to 9.6/27.9.  She is on low-dose aspirin but no other blood thinners. Diabetic status was checked 6/20 and revealed an A1c of 7.5%.  TSH was slightly elevated at 5.113.  Review of systems: She denies any other bleeding dyscrasias.  Staff has noted blood in the diaper but no documented rectal bleeding or melena.  Constitutional: No fever, significant weight change  ENT/mouth: No epistaxis Cardiovascular: No chest pain, palpitations, paroxysmal nocturnal dyspnea,  edema  Respiratory: No cough, sputum production, hemoptysis Gastrointestinal: No heartburn, dysphagia,   nausea /vomiting, rectal bleeding, melena, change in bowels Dermatologic: No rash, pruritus, change in appearance of skin Neurologic: No dizziness, headache, syncope, seizures Psychiatric: No significant anxiety, depression, insomnia, anorexia Endocrine: No change in hair/skin/nails, excessive thirst, excessive hunger, excessive urination  Hematologic/lymphatic: No significant bruising, lymphadenopathy, abnormal bleeding  Physical exam:  Pertinent or positive findings: She appears adequately nourished.  The left lacrimal gland is prominent.  She is wearing only an upper plate.  Second heart sound is split.  Abdomen is protuberant with active bowel sounds.  She is tender to palpation in the suprapubic area.  The Foley bag contains 200 cc of dark blood.  Pedal pulses are decreased.  Trace edema present. She has minor bruising over the upper extremities.  Strength to opposition in the lower extremities is extremely poor.  General appearance: Adequately nourished; no acute distress, increased work of breathing is present.   Lymphatic: No lymphadenopathy about the head, neck, axilla. Eyes: No conjunctival inflammation or lid edema is present. There is no scleral icterus. Ears:  External ear exam shows no significant lesions or deformities.   Nose:  External nasal examination shows no deformity or inflammation. Nasal mucosa are pink and moist without lesions, exudates Neck:  No thyromegaly, masses, tenderness noted.    Heart:  Normal rate and regular rhythm. S1 normal without gallop, murmur, click, rub .  Lungs: Chest clear to auscultation without wheezes, rhonchi, rales, rubs. Abdomen: Abdomen is soft  with no organomegaly, hernias, masses. GU: No vaginal bleeding observed. Extremities:  No  cyanosis, clubbing Neurologic exam :Balance, Rhomberg, finger to nose testing could not be completed due to clinical state Skin: Warm & dry w/o tenting. No significant lesions or rash.  See summary under each  active problem in the Problem List with associated updated therapeutic plan

## 2023-02-19 NOTE — Patient Instructions (Signed)
See assessment and plan under each diagnosis in the problem list and acutely for this visit Nonemergency transport to ED for further urologic evaluation.

## 2023-02-19 NOTE — H&P (Incomplete)
History and Physical    [name]:[id number] DOB: [birth year] DOA: 02/19/2023  PCP: Sharee Holster, NP  Patient coming from: [name] ED-ED transfer  I have personally briefly reviewed patient's old medical records in [facility]  Chief Complaint: abdominal pain   HPI: [name] is a 87 y.o. 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 hypothyroidism who was noted by staff to have suprapubic pain associated with dysuria as well as hematuria x 2 days insetting of chronic foley due to neurogenic bladder. Per notes last foley change was 6/19. Patient due to these symptoms noted on f/u NH visit by pcp was referred to [facility].    ED Course:  [place] Afeb, bp 123/55, hr 73, rr 18 sat 97% UA :+protein, LETrace, +bacteria  wbc >50 Wbc 6, hgb 7.9 ( 8.8)  Na 130 , K 6.4 ( tx 5.4), cr 1.76 ( 1.62) Glu 225 EKG: nsr  Tx lokelma, insulin , d50 Fentanyl , ns 500cc , ctx 1g Urine cath exchanged in Ed  [place] Afeb, bp 142/72, hr 80 rr 16 sat 100% on ra K 5.4 Ctabd/pelvis . Diverticular disease of left colon with minimal fat stranding adjacent to the descending colon suggestive of mild diverticulitis. 2. No hydronephrosis or ureteral stone. Decompressed urinary bladder with Foley catheter. Bladder wall appears thickened with perivesical stranding, correlate with urinalysis for cystitis. 3. Mildly atrophic left kidney 4. Aortic atherosclerosis. Tx zosyn   Review of Systems: As per HPI otherwise 10 point review of systems negative.   Past Medical History:  Diagnosis Date   Adult failure to thrive    Anemia    Anxiety    Atherosclerosis of aorta (HCC)    Central cord syndrome at C4 level of cervical spinal cord, subsequent encounter (HCC)    CKD (chronic kidney disease)     stage 3   Depression    DM type 2 with diabetic peripheral neuropathy (HCC)    Dysphagia    GERD (gastroesophageal reflux disease)    Gout    High cholesterol    HTN (hypertension)    Hyponatremia    MDD (major depressive disorder)    Neck pain    Neuropathy    Quadriplegia, C1-C4 incomplete (HCC)    Radiculopathy    RLS (restless legs syndrome)    Urinary retention     Past Surgical History:  Procedure Laterality Date   ABDOMINAL HYSTERECTOMY     ANTERIOR CERVICAL DECOMP/DISCECTOMY FUSION N/A 02/05/2021   Procedure: Cervical Three-Four  Anterior cervical decompression/discectomy/fusion;  Surgeon: Coletta Memos, MD;  Location: [facility] OR;  Service: Neurosurgery;  Laterality: N/A;  RM 20   APPENDECTOMY     BACK SURGERY     BALLOON DILATION N/A 11/19/2022   Procedure: BALLOON DILATION;  Surgeon: Lanelle Bal, DO;  Location: [facility];  Service: Endoscopy;  Laterality: N/A;   BIOPSY  09/22/2021   Procedure: BIOPSY;  Surgeon: Lanelle Bal, DO;  Location: [facility];  Service: Endoscopy;;   BIOPSY  11/19/2022   Procedure: BIOPSY;  Surgeon: Lanelle Bal, DO;  Location: [facility];  Service: Endoscopy;;   CERVICAL DISC SURGERY     ESOPHAGOGASTRODUODENOSCOPY (EGD) WITH PROPOFOL N/A 09/22/2021   Procedure: ESOPHAGOGASTRODUODENOSCOPY (EGD) WITH PROPOFOL;  Surgeon: Lanelle Bal, DO;  Location:  [facility];  Service: Endoscopy;  Laterality: N/A;  1:00pm   ESOPHAGOGASTRODUODENOSCOPY (EGD) WITH PROPOFOL N/A 11/19/2022   Procedure: ESOPHAGOGASTRODUODENOSCOPY (EGD) WITH PROPOFOL;  Surgeon: Lanelle Bal, DO;  Location: [facility];  Service: Endoscopy;  Laterality: N/A;  11:30 am, asa 3   HAND SURGERY     KNEE SURGERY       reports that she has never smoked. She has never used smokeless tobacco. She reports that she does not drink alcohol and does not use drugs.  Allergies  Allergen Reactions   Ace Inhibitors Other (See Comments)    Hyperkalemia    Codeine      Unknown reaction   Sulfa Antibiotics     Unknown reaction    Family History  Problem Relation Age of Onset   Cancer Mother    Cardiomyopathy Father    Seizures Sister        childhood   Seizures Brother        in his 84s   Colon cancer Neg Hx     Prior to Admission medications   Medication Sig Start Date End Date Taking? Authorizing Provider  acetaminophen (TYLENOL) 325 MG tablet Take 650 mg by mouth every 8 (eight) hours.   Yes [provider]  amLODipine (NORVASC) 5 MG tablet Take 5 mg by mouth daily.   Yes [provider]  aspirin 81 MG chewable tablet Chew 81 mg by mouth daily.   Yes [provider]  augmented betamethasone dipropionate (DIPROLENE-AF) 0.05 % ointment Apply 1 Application topically 2 (two) times daily as needed. Apply to stomach and both breast   Yes [provider]  calcium-vitamin D (OSCAL WITH D) 500-5 MG-MCG tablet Take 1 tablet by mouth 2 (two) times daily.   Yes [provider]  camphor-menthol Wynelle Fanny) lotion Apply 1 Application topically 2 (two) times daily as needed for itching.   Yes [provider]  dapagliflozin propanediol (FARXIGA) 5 MG TABS tablet Take 5 mg by mouth daily.   Yes [provider]  DULoxetine (CYMBALTA) 20 MG capsule Take 20 mg by mouth daily.   Yes [provider]  fexofenadine (ALLEGRA) 60 MG tablet Take 60 mg by mouth daily.   Yes [provider]  gabapentin (NEURONTIN) 300 MG capsule Take 300 mg by mouth 3 (three) times daily.   Yes [provider]  insulin aspart (NOVOLOG FLEXPEN) 100 UNIT/ML FlexPen Inject 5 Units into the skin 3 (three) times daily with meals. 8 am, 12 pm, and 6 pm   Yes [provider]  ipratropium-albuterol (DUONEB) 0.5-2.5 (3) MG/3ML SOLN Take 3 mLs by nebulization every 6 (six) hours as needed (wheezing).   Yes [provider]  LANTUS SOLOSTAR 100 UNIT/ML Solostar Pen Inject 25 Units into the skin at  bedtime. 09/04/21  Yes [provider]  levETIRAcetam (KEPPRA) 750 MG tablet Take 750 mg by mouth 2 (two) times daily.   Yes [provider]  linaclotide (LINZESS) 72 MCG capsule Take 1 capsule (72 mcg total) by mouth daily before breakfast. 10/20/22  Yes Mahon, Courtney L, NP  LORazepam (ATIVAN) 2 MG/ML injection Inject 0.5 mLs (1 mg total) into the muscle every 15 (fifteen) minutes as needed. 12/07/22  Yes Sharee Holster, NP  melatonin 5 MG TABS Take 5 mg by mouth at bedtime.   Yes [provider]  Multiple Vitamins-Minerals (THEREMS M PO) Take 1 tablet by mouth daily. (multivitamin with folic acid) tablet; 400 mcg;  Yes [provider]  omeprazole (PRILOSEC) 40 MG capsule Take 1 capsule (40 mg total) by mouth in the morning and at bedtime. 10/20/22  Yes Mahon, Frederik Schmidt, NP  Phenylephrine-Cocoa Butter 0.25-85.5 % SUPP Place 1 suppository rectally 2 (two) times daily as needed.   Yes [provider]  polyethylene glycol (MIRALAX / GLYCOLAX) 17 g packet Take 17 g by mouth daily.   Yes [provider]  rOPINIRole (REQUIP) 1 MG tablet Take 1 mg by mouth at bedtime.   Yes [provider]  rosuvastatin (CRESTOR) 20 MG tablet Take 20 mg by mouth every evening.   Yes [provider]  tiZANidine (ZANAFLEX) 2 MG tablet Take 2 mg by mouth every 6 (six) hours as needed for muscle spasms (for back and sciatic pain).   Yes [provider]  zinc oxide 20 % ointment Apply 1 Application topically See admin instructions. Apply every shift to bilateral buttocks, sacrum, and coccyx   Yes [provider]  AMBULATORY NON FORMULARY MEDICATION Medication Name:  Continue monthly catheter changes with 17fr catheter at skilled nursing facility. 08/12/21   McKenzie, Mardene Celeste, MD  feeding supplement (ENSURE ENLIVE / ENSURE PLUS) LIQD Take 237 mLs by mouth daily.    [provider]  Insulin Pen Needle 30G X 5 MM MISC 1 Device by  Does not apply route daily. 3/16"    Sharee Holster, NP  NON FORMULARY Diet - Regular    [provider]    Physical Exam: Vitals:   02/19/23 1848 02/19/23 2100 02/19/23 2130 02/19/23 2200  BP: (!) 142/72 (!) 123/47 (!) 112/47 (!) 104/48  Pulse: 80 78 78 78  Resp: 16 17 15 20   Temp: 97.8 F (36.6 C)     TempSrc: Oral     SpO2: 100% 96% 96% 97%  Weight:      Height:        Constitutional: NAD, calm, comfortable Vitals:   02/19/23 1848 02/19/23 2100 02/19/23 2130 02/19/23 2200  BP: (!) 142/72 (!) 123/47 (!) 112/47 (!) 104/48  Pulse: 80 78 78 78  Resp: 16 17 15 20   Temp: 97.8 F (36.6 C)     TempSrc: Oral     SpO2: 100% 96% 96% 97%  Weight:      Height:       Eyes: PERRL, lids and conjunctivae normal ENMT: Mucous membranes are moist. Posterior pharynx clear of any exudate or lesions.Normal dentition.  Neck: normal, supple, no masses, no thyromegaly Respiratory: clear to auscultation bilaterally, no wheezing, no crackles. Normal respiratory effort. No accessory muscle use.  Cardiovascular: Regular rate and rhythm, no murmurs / rubs / gallops. No extremity edema. 2+ pedal pulses. No carotid bruits.  Abdomen: no tenderness, no masses palpated. No hepatosplenomegaly. Bowel sounds positive.  Musculoskeletal: no clubbing / cyanosis. No joint deformity upper and lower extremities. Good ROM, no contractures. Normal muscle tone.  Skin: no rashes, lesions, ulcers. No induration Neurologic: CN 2-12 grossly intact. Sensation intact, DTR normal. Strength 5/5 in all 4.  Psychiatric: Normal judgment and insight. Alert and oriented x 3. Normal mood.    Labs on Admission: I have personally reviewed following labs and imaging studies  CBC: Recent Labs  Lab 02/19/23 1404  WBC 6.0  NEUTROABS 3.2  HGB 7.9*  HCT 24.7*  MCV 95.7  PLT 202   Basic Metabolic Panel: Recent Labs  Lab 02/19/23 1404 02/19/23 1913  NA 130*  --   K 6.4* 5.4*  CL  103  --   CO2 18*  --    GLUCOSE 225*  --   BUN 45*  --   CREATININE 1.76*  --   CALCIUM 8.7*  --    GFR: Estimated Creatinine Clearance: 22.7 mL/min (A) (by C-G formula based on SCr of 1.76 mg/dL (H)). Liver Function Tests: No results for input(s): "AST", "ALT", "ALKPHOS", "BILITOT", "PROT", "ALBUMIN" in the last 168 hours. No results for input(s): "LIPASE", "AMYLASE" in the last 168 hours. No results for input(s): "AMMONIA" in the last 168 hours. Coagulation Profile: Recent Labs  Lab 02/19/23 1519  INR 1.0   Cardiac Enzymes: No results for input(s): "CKTOTAL", "CKMB", "CKMBINDEX", "TROPONINI" in the last 168 hours. BNP (last 3 results) No results for input(s): "PROBNP" in the last 8760 hours. HbA1C: Recent Labs    02/18/23 0800  HGBA1C 7.5*   CBG: Recent Labs  Lab 02/19/23 1534 02/19/23 1913  GLUCAP 200* 201*   Lipid Profile: No results for input(s): "CHOL", "HDL", "LDLCALC", "TRIG", "CHOLHDL", "LDLDIRECT" in the last 72 hours. Thyroid Function Tests: Recent Labs    02/18/23 0856  TSH 5.113*   Anemia Panel: No results for input(s): "VITAMINB12", "FOLATE", "FERRITIN", "TIBC", "IRON", "RETICCTPCT" in the last 72 hours. Urine analysis:    Component Value Date/Time   COLORURINE RED (A) 02/19/2023 1613   APPEARANCEUR CLOUDY (A) 02/19/2023 1613   LABSPEC 1.007 02/19/2023 1613   PHURINE 6.0 02/19/2023 1613   GLUCOSEU >=500 (A) 02/19/2023 1613   HGBUR MODERATE (A) 02/19/2023 1613   BILIRUBINUR NEGATIVE 02/19/2023 1613   KETONESUR NEGATIVE 02/19/2023 1613   PROTEINUR 100 (A) 02/19/2023 1613   NITRITE NEGATIVE 02/19/2023 1613   LEUKOCYTESUR LARGE (A) 02/19/2023 1613    Radiological Exams on Admission: CT ABDOMEN PELVIS WO CONTRAST  Result Date: 02/19/2023 CLINICAL DATA:  Abdomen pain flank pain EXAM: CT ABDOMEN AND PELVIS WITHOUT CONTRAST TECHNIQUE: Multidetector CT imaging of the abdomen and pelvis was performed following the standard protocol without IV contrast. RADIATION DOSE  REDUCTION: This exam was performed according to the departmental dose-optimization program which includes automated exposure control, adjustment of the mA and/or kV according to patient size and/or use of iterative reconstruction technique. COMPARISON:  CT 10/31/2022 FINDINGS: Lower chest: Lung bases are clear Hepatobiliary: No focal liver abnormality is seen. No gallstones, gallbladder wall thickening, or biliary dilatation. Pancreas: Unremarkable. No pancreatic ductal dilatation or surrounding inflammatory changes. Spleen: Normal in size without focal abnormality. Adrenals/Urinary Tract: Adrenal glands are normal. Mildly atrophic left kidney. No hydronephrosis. Decompressed urinary bladder with Foley catheter. Bladder appears thick walled with perivesical stranding Stomach/Bowel: Stomach nonenlarged. No dilated small bowel. Diverticular disease of left colon. Minimal fat stranding adjacent to the descending colon, for example series 3, image 54 and coronal series 6, image 63 suggestive of mild diverticulitis. Vascular/Lymphatic: Moderate aortic atherosclerosis. No aneurysm. No suspicious lymph nodes. Reproductive: Status post hysterectomy. No adnexal masses. Other: Negative for pelvic effusion or free air Musculoskeletal: No acute osseous abnormality IMPRESSION: 1. Diverticular disease of left colon with minimal fat stranding adjacent to the descending colon suggestive of mild diverticulitis. 2. No hydronephrosis or ureteral stone. Decompressed urinary bladder with Foley catheter. Bladder wall appears thickened with perivesical stranding, correlate with urinalysis for cystitis. 3. Mildly atrophic left kidney 4. Aortic atherosclerosis. Aortic Atherosclerosis (ICD10-I70.0). Electronically Signed   By: Jasmine Pang M.D.   On: 02/19/2023 20:48    EKG: Independently reviewed. ***  Assessment/Plan  Complicated Urinary Cath associated UTI with hematuria -admit to med tele  -  continue with zosyn  -f/u on  urine/blood cultures  - gently ivfs    Early acute diverticulitis  - clear liquids advance as tolerated  - continue on zosyn   Hematuria  -noted drop in h/h  - urology consult for further evaluation  -CT scan notes No hydronephrosis or ureteral stone. Decompressed urinary bladder with Foley catheter. Bladder wall appears thickened with perivesical stranding, correlate with urinalysis for cystitis. -no obvious mass reported   Anemia, progressive -noted drop to 7.9 from 8.8 in setting of hematuria  - continue to trend  - if symptomatic or drop <7 will transfuse  -type and cross now   Hyperkalemia  -resolved s/p tx in ED -repeat k 5.4  -continue to monitor, repeat tx as needed   CKDIII/IV -cr 1.62 now 1.76 ( high ranged 1.75)  - gentle ivfs  - avoid nephrotoxic medications  - monitor urine out put  Abnormal TSH -t3,t4 pending  -prior chart mentions hx of hypothyroidism   Anxiety /depression  -resume home regimen as able    GERD -ppi    DMII  -last A1c 7.5 -iss/fs -resume home regimen once med rec completed as appropriate    HTN -hold medication currently , last bp noted to be lower than baseline  -resume as bp allows    RLS  Central cord syndrome at C4  -associated neurogenic bladder DVT prophylaxis: scd Code Status:  Family Communication:  Disposition Plan: patient  expected to be admitted greater than 2 midnights  Consults called: Urology  Admission status: med tele   Lurline Del MD [facility]   If 7PM-7AM, please contact night-coverage [email or web address] Password [facility]  02/19/2023, 10:33 PM

## 2023-02-19 NOTE — ED Notes (Signed)
[facility] at bedside to transport for CT. Kellogg RN

## 2023-02-19 NOTE — ED Triage Notes (Signed)
[facility] for hematuria for 2+ days and c/o lower abdominal pain. She was evaluated by a dr at the [facility] and EMS was told to let ED know that the dr put in notes in epic. Pt A&Ox4 and VS WNL. Hx of seizures and falls. Takes 81mg  ASA daily

## 2023-02-19 NOTE — ED Notes (Signed)
Pt and family aware that patient will be transported via [facility] for CT. [name]

## 2023-02-19 NOTE — Assessment & Plan Note (Addendum)
Since 07/09/2022 H/H has varied from a low of 8.4/26.5 up to a high of 9.6/27.9.  Most recent CBC was 01/25/2023 revealing H/H of 8.8/27.0.  No bleeding dyscrasias other than gross hematuria reported by staff or patient CBC will be updated in the ED.

## 2023-02-19 NOTE — ED Notes (Signed)
Report given to [facility]. Kellogg RN

## 2023-02-20 DIAGNOSIS — N3001 Acute cystitis with hematuria: Secondary | ICD-10-CM

## 2023-02-20 LAB — COMPREHENSIVE METABOLIC PANEL
ALT: 15 U/L (ref 0–44)
AST: 15 U/L (ref 15–41)
Albumin: 3.1 g/dL — ABNORMAL LOW (ref 3.5–5.0)
Alkaline Phosphatase: 54 U/L (ref 38–126)
Anion gap: 14 (ref 5–15)
BUN: 44 mg/dL — ABNORMAL HIGH (ref 8–23)
CO2: 14 mmol/L — ABNORMAL LOW (ref 22–32)
Calcium: 8.5 mg/dL — ABNORMAL LOW (ref 8.9–10.3)
Chloride: 105 mmol/L (ref 98–111)
Creatinine, Ser: 1.79 mg/dL — ABNORMAL HIGH (ref 0.44–1.00)
GFR, Estimated: 28 mL/min — ABNORMAL LOW (ref >60)
Glucose, Bld: 212 mg/dL — ABNORMAL HIGH (ref 70–99)
Potassium: 5.5 mmol/L — ABNORMAL HIGH (ref 3.5–5.1)
Sodium: 133 mmol/L — ABNORMAL LOW (ref 135–145)
Total Bilirubin: 0.3 mg/dL (ref 0.3–1.2)
Total Protein: 6.2 g/dL — ABNORMAL LOW (ref 6.5–8.1)

## 2023-02-20 LAB — GLUCOSE, CAPILLARY
Glucose-Capillary: 234 mg/dL — ABNORMAL HIGH (ref 70–99)
Glucose-Capillary: 244 mg/dL — ABNORMAL HIGH (ref 70–99)
Glucose-Capillary: 210 mg/dL — ABNORMAL HIGH (ref 70–99)
Glucose-Capillary: 190 mg/dL — ABNORMAL HIGH (ref 70–99)

## 2023-02-20 LAB — TYPE AND SCREEN: Antibody Screen: NEGATIVE

## 2023-02-20 LAB — PROCALCITONIN: Procalcitonin: 0.17 ng/mL

## 2023-02-20 LAB — BASIC METABOLIC PANEL
Anion gap: 10 (ref 5–15)
Anion gap: 11 (ref 5–15)
BUN: 45 mg/dL — ABNORMAL HIGH (ref 8–23)
BUN: 46 mg/dL — ABNORMAL HIGH (ref 8–23)
CO2: 17 mmol/L — ABNORMAL LOW (ref 22–32)
CO2: 17 mmol/L — ABNORMAL LOW (ref 22–32)
Calcium: 8.5 mg/dL — ABNORMAL LOW (ref 8.9–10.3)
Calcium: 8.3 mg/dL — ABNORMAL LOW (ref 8.9–10.3)
Chloride: 107 mmol/L (ref 98–111)
Chloride: 105 mmol/L (ref 98–111)
Creatinine, Ser: 1.76 mg/dL — ABNORMAL HIGH (ref 0.44–1.00)
Creatinine, Ser: 1.78 mg/dL — ABNORMAL HIGH (ref 0.44–1.00)
GFR, Estimated: 28 mL/min — ABNORMAL LOW (ref >60)
GFR, Estimated: 28 mL/min — ABNORMAL LOW (ref >60)
Glucose, Bld: 195 mg/dL — ABNORMAL HIGH (ref 70–99)
Glucose, Bld: 257 mg/dL — ABNORMAL HIGH (ref 70–99)
Potassium: 5.6 mmol/L — ABNORMAL HIGH (ref 3.5–5.1)
Potassium: 5.5 mmol/L — ABNORMAL HIGH (ref 3.5–5.1)
Sodium: 134 mmol/L — ABNORMAL LOW (ref 135–145)
Sodium: 133 mmol/L — ABNORMAL LOW (ref 135–145)

## 2023-02-20 LAB — URINE CULTURE

## 2023-02-20 LAB — CBC
HCT: 24.1 % — ABNORMAL LOW (ref 36.0–46.0)
Hemoglobin: 7.5 g/dL — ABNORMAL LOW (ref 12.0–15.0)
MCH: 29.8 pg (ref 26.0–34.0)
MCHC: 31.1 g/dL (ref 30.0–36.0)
MCV: 95.6 fL (ref 80.0–100.0)
Platelets: 186 10*3/uL (ref 150–400)
RBC: 2.52 MIL/uL — ABNORMAL LOW (ref 3.87–5.11)
RDW: 13.9 % (ref 11.5–15.5)
WBC: 6.2 10*3/uL (ref 4.0–10.5)
nRBC: 0 % (ref 0.0–0.2)

## 2023-02-20 LAB — LACTIC ACID, PLASMA
Lactic Acid, Venous: 1.1 mmol/L (ref 0.5–1.9)
Lactic Acid, Venous: 1.2 mmol/L (ref 0.5–1.9)

## 2023-02-20 LAB — T4, FREE: Free T4: 0.8 ng/dL (ref 0.61–1.12)

## 2023-02-20 LAB — MAGNESIUM: Magnesium: 1.8 mg/dL (ref 1.7–2.4)

## 2023-02-20 LAB — HEMOGLOBIN: Hemoglobin: 7.6 g/dL — ABNORMAL LOW (ref 12.0–15.0)

## 2023-02-20 LAB — C-REACTIVE PROTEIN: CRP: 5 mg/dL — ABNORMAL HIGH (ref <1.0)

## 2023-02-20 MED ORDER — CHLORHEXIDINE GLUCONATE CLOTH 2 % EX PADS
6.0000 | MEDICATED_PAD | Freq: Every day | CUTANEOUS | Status: DC
  Administered 2023-02-20 – 2023-02-24 (×5): 6 via TOPICAL

## 2023-02-20 MED ORDER — SODIUM ZIRCONIUM CYCLOSILICATE 10 G PO PACK
10.0000 g | PACK | Freq: Once | ORAL | Status: AC
  Administered 2023-02-20: 10 g via ORAL
  Filled 2023-02-20: qty 1

## 2023-02-20 MED ORDER — HYDROCORTISONE (PERIANAL) 2.5 % EX CREA
1.0000 | TOPICAL_CREAM | Freq: Three times a day (TID) | CUTANEOUS | Status: DC
  Administered 2023-02-20 – 2023-02-24 (×13): 1 via RECTAL
  Filled 2023-02-20 (×2): qty 28.35

## 2023-02-20 MED ORDER — PIPERACILLIN-TAZOBACTAM 3.375 G IVPB
3.3750 g | Freq: Three times a day (TID) | INTRAVENOUS | Status: DC
  Administered 2023-02-20 – 2023-02-24 (×13): 3.375 g via INTRAVENOUS
  Filled 2023-02-20 (×13): qty 50

## 2023-02-20 MED ORDER — SODIUM BICARBONATE 650 MG PO TABS
650.0000 mg | ORAL_TABLET | Freq: Three times a day (TID) | ORAL | Status: DC
  Administered 2023-02-20 – 2023-02-24 (×14): 650 mg via ORAL
  Filled 2023-02-20 (×14): qty 1

## 2023-02-20 NOTE — ED Notes (Signed)
ED TO INPATIENT HANDOFF REPORT  ED Nurse Name and Phone #: Darvin Neighbours [phone]  S Name/Age/Gender [name] 87 y.o. female Room/Bed: 017C/017C  Code Status   Code Status: Full Code  Home/SNF/Other Skilled nursing facility Patient oriented to: self, place, time, and situation Is this baseline? Yes   Triage Complete: Triage complete  Chief Complaint UTI (urinary tract infection) [N39.0]  Triage Note [facility] from [facility] for hematuria for 2+ days and c/o lower abdominal pain. She was evaluated by a dr at the [facility] and EMS was told to let ED know that the dr put in notes in epic. Pt A&Ox4 and VS WNL. Hx of seizures and falls. Takes 81mg  ASA daily   Allergies Allergies  Allergen Reactions   Ace Inhibitors Other (See Comments)    Hyperkalemia    Codeine     Unknown reaction   Sulfa Antibiotics     Unknown reaction    Level of Care/Admitting Diagnosis ED Disposition     ED Disposition  Admit   Condition  --   Comment  Hospital Area: [facility] [[id number]]  Level of Care: Telemetry Medical [[id number]]  May admit patient to [facility] or [facility] if equivalent level of care is available:: No  Covid Evaluation: Asymptomatic - no recent exposure (last 10 days) testing not required  Diagnosis: UTI (urinary tract infection) [324401]  Admitting Physician: Lurline Del [[id number]]  Attending Physician: Lurline Del [[id number]]  Certification:: I certify this patient is being admitted for an inpatient-only procedure  Estimated Length of Stay: 3          B Medical/Surgery History Past Medical History:  Diagnosis Date   Adult failure to thrive    Anemia    Anxiety    Atherosclerosis of aorta (HCC)    Central cord syndrome at C4 level of cervical spinal cord, subsequent encounter (HCC)    CKD (chronic kidney disease)    stage 3   Depression    DM type 2 with diabetic peripheral neuropathy (HCC)    Dysphagia    GERD  (gastroesophageal reflux disease)    Gout    High cholesterol    HTN (hypertension)    Hyponatremia    MDD (major depressive disorder)    Neck pain    Neuropathy    Quadriplegia, C1-C4 incomplete (HCC)    Radiculopathy    RLS (restless legs syndrome)    Urinary retention    Past Surgical History:  Procedure Laterality Date   ABDOMINAL HYSTERECTOMY     ANTERIOR CERVICAL DECOMP/DISCECTOMY FUSION N/A 02/05/2021   Procedure: Cervical Three-Four  Anterior cervical decompression/discectomy/fusion;  Surgeon: Coletta Memos, MD;  Location: [facility] OR;  Service: Neurosurgery;  Laterality: N/A;  RM 20   APPENDECTOMY     BACK SURGERY     BALLOON DILATION N/A 11/19/2022   Procedure: BALLOON DILATION;  Surgeon: Lanelle Bal, DO;  [facility];  Service: Endoscopy;  Laterality: N/A;   BIOPSY  09/22/2021   Procedure: BIOPSY;  Surgeon: Lanelle Bal, DO;  [facility];  Service: Endoscopy;;   BIOPSY  11/19/2022   Procedure: BIOPSY;  Surgeon: Lanelle Bal, DO;  [facility];  Service: Endoscopy;;   CERVICAL DISC SURGERY     ESOPHAGOGASTRODUODENOSCOPY (EGD) WITH PROPOFOL N/A 09/22/2021   Procedure: ESOPHAGOGASTRODUODENOSCOPY (EGD) WITH PROPOFOL;  Surgeon: Lanelle Bal, DO;  [facility];  Service: Endoscopy;  Laterality:  N/A;  1:00pm   ESOPHAGOGASTRODUODENOSCOPY (EGD) WITH PROPOFOL N/A 11/19/2022   Procedure: ESOPHAGOGASTRODUODENOSCOPY (EGD) WITH PROPOFOL;  Surgeon: Lanelle Bal, DO;  [facility];  Service: Endoscopy;  Laterality: N/A;  11:30 am, asa 3   HAND SURGERY     KNEE SURGERY       A IV Location/Drains/Wounds Patient Lines/Drains/Airways Status     Active Line/Drains/Airways     Name Placement date Placement time Site Days   Peripheral IV 02/19/23 20 G Right Antecubital 02/19/23  1411  Antecubital  1   Urethral Catheter Cat Williams Straight-tip 14 Fr. 02/19/23  1658  Straight-tip  1   Pressure Injury 01/29/21  Buttocks Mid full thickness fissure related to moisture to inner gluteal cleft, not a pressure injury 01/29/21  2200  -- 752   Pressure Injury 07/07/21 Sacrum Mid Stage 2 -  Partial thickness loss of dermis presenting as a shallow open injury with a red, pink wound bed without slough. stage 2 to sacrum measuring 4cm x 1 cm 07/07/21  0244  -- 593            Intake/Output Last 24 hours  Intake/Output Summary (Last 24 hours) at 02/20/2023 0031 Last data filed at 02/19/2023 1945 Gross per 24 hour  Intake 1150 ml  Output --  Net 1150 ml    Labs/Imaging Results for orders placed or performed during the hospital encounter of 02/19/23 (from the past 48 hour(s))  Urinalysis, w/ Reflex to Culture (Infection Suspected) -Urine, Catheterized; Indwelling urinary catheter     Status: Abnormal   Collection Time: 02/19/23  1:53 PM  Result Value Ref Range   Specimen Source URINE, CATHETERIZED    Color, Urine RED (A) YELLOW    Comment: BIOCHEMICALS MAY BE AFFECTED BY COLOR   APPearance HAZY (A) CLEAR   Specific Gravity, Urine 1.008 1.005 - 1.030   pH 6.0 5.0 - 8.0   Glucose, UA 150 (A) NEGATIVE mg/dL   Hgb urine dipstick MODERATE (A) NEGATIVE   Bilirubin Urine NEGATIVE NEGATIVE   Ketones, ur NEGATIVE NEGATIVE mg/dL   Protein, ur 409 (A) NEGATIVE mg/dL   Nitrite NEGATIVE NEGATIVE   Leukocytes,Ua TRACE (A) NEGATIVE   RBC / HPF >50 0 - 5 RBC/hpf   WBC, UA 11-20 0 - 5 WBC/hpf    Comment:        Reflex urine culture not performed if WBC <=10, OR if Squamous epithelial cells >5. If Squamous epithelial cells >5 suggest recollection.    Bacteria, UA FEW (A) NONE SEEN   Squamous Epithelial / HPF 0-5 0 - 5 /HPF    Comment: Performed at [facility], [address] [place], [place] [address]  CBC with Differential     Status: Abnormal   Collection Time: 02/19/23  2:04 PM  Result Value Ref Range   WBC 6.0 4.0 - 10.5 K/uL   RBC 2.58 (L) 3.87 - 5.11 MIL/uL   Hemoglobin 7.9 (L) 12.0 - 15.0 g/dL    HCT 47.8 (L) 29.5 - 46.0 %   MCV 95.7 80.0 - 100.0 fL   MCH 30.6 26.0 - 34.0 pg   MCHC 32.0 30.0 - 36.0 g/dL   RDW 62.1 30.8 - 65.7 %   Platelets 202 150 - 400 K/uL   nRBC 0.0 0.0 - 0.2 %   Neutrophils Relative % 53 %   Neutro Abs 3.2 1.7 - 7.7 K/uL   Lymphocytes Relative 34 %   Lymphs Abs 2.0 0.7 - 4.0 K/uL  Monocytes Relative 6 %   Monocytes Absolute 0.4 0.1 - 1.0 K/uL   Eosinophils Relative 7 %   Eosinophils Absolute 0.4 0.0 - 0.5 K/uL   Basophils Relative 0 %   Basophils Absolute 0.0 0.0 - 0.1 K/uL   Immature Granulocytes 0 %   Abs Immature Granulocytes 0.02 0.00 - 0.07 K/uL    Comment: Performed at [facility], [address] [place], [place] [address]  Basic metabolic panel     Status: Abnormal   Collection Time: 02/19/23  2:04 PM  Result Value Ref Range   Sodium 130 (L) 135 - 145 mmol/L   Potassium 6.4 (HH) 3.5 - 5.1 mmol/L    Comment: CRITICAL RESULT CALLED TO, READ BACK BY AND VERIFIED WITH  AVERY, D AT 1445 ON 02/19/23 BY SMN.   Chloride 103 98 - 111 mmol/L   CO2 18 (L) 22 - 32 mmol/L   Glucose, Bld 225 (H) 70 - 99 mg/dL    Comment: Glucose reference range applies only to samples taken after fasting for at least 8 hours.   BUN 45 (H) 8 - 23 mg/dL   Creatinine, Ser 9.14 (H) 0.44 - 1.00 mg/dL   Calcium 8.7 (L) 8.9 - 10.3 mg/dL   GFR, Estimated 28 (L) >60 mL/min    Comment: (NOTE) Calculated using the CKD-EPI Creatinine Equation (2021)    Anion gap 9 5 - 15    Comment: Performed at [facility], [address], [place], [place] [address]  Protime-INR     Status: None   Collection Time: 02/19/23  3:19 PM  Result Value Ref Range   Prothrombin Time 13.2 11.4 - 15.2 seconds   INR 1.0 0.8 - 1.2    Comment: (NOTE) INR goal varies based on device and disease states. Performed at [facility], [address] [place], [place] [address]   CBG monitoring, ED     Status: Abnormal   Collection Time: 02/19/23  3:34 PM  Result Value Ref Range   Glucose-Capillary 200 (H) 70  - 99 mg/dL    Comment: Glucose reference range applies only to samples taken after fasting for at least 8 hours.  Urinalysis, Routine w reflex microscopic -Urine, Clean Catch     Status: Abnormal   Collection Time: 02/19/23  4:13 PM  Result Value Ref Range   Color, Urine RED (A) YELLOW    Comment: BIOCHEMICALS MAY BE AFFECTED BY COLOR   APPearance CLOUDY (A) CLEAR   Specific Gravity, Urine 1.007 1.005 - 1.030   pH 6.0 5.0 - 8.0   Glucose, UA >=500 (A) NEGATIVE mg/dL   Hgb urine dipstick MODERATE (A) NEGATIVE   Bilirubin Urine NEGATIVE NEGATIVE   Ketones, ur NEGATIVE NEGATIVE mg/dL   Protein, ur 865 (A) NEGATIVE mg/dL   Nitrite NEGATIVE NEGATIVE   Leukocytes,Ua LARGE (A) NEGATIVE   RBC / HPF >50 0 - 5 RBC/hpf   WBC, UA >50 0 - 5 WBC/hpf   Bacteria, UA MANY (A) NONE SEEN   Squamous Epithelial / HPF 0-5 0 - 5 /HPF   WBC Clumps PRESENT    Hyaline Casts, UA PRESENT     Comment: Performed at [facility], [address], [place], [place] [address]  Potassium     Status: Abnormal   Collection Time: 02/19/23  7:13 PM  Result Value Ref Range   Potassium 5.4 (H) 3.5 - 5.1 mmol/L    Comment: Performed at [facility], [address], [place], [place] [address]  CBG monitoring, ED     Status: Abnormal   Collection Time: 02/19/23  7:13 PM  Result Value Ref Range   Glucose-Capillary 201 (H) 70 - 99 mg/dL    Comment: Glucose reference range applies only to samples taken after fasting for at least 8 hours.  Type and screen [facility]     Status: None (Preliminary result)   Collection Time: 02/20/23 12:05 AM  Result Value Ref Range   ABO/RH(D) PENDING    Antibody Screen PENDING    Sample Expiration      02/23/2023,2359 Performed at [facility], [address], [place], [place] [address]   Hemoglobin     Status: Abnormal   Collection Time: 02/20/23 12:06 AM  Result Value Ref Range   Hemoglobin 7.6 (L) 12.0 - 15.0 g/dL    Comment: Performed at [facility], [address] [place], [place] [address]   CT ABDOMEN PELVIS WO CONTRAST  Result Date: 02/19/2023 CLINICAL DATA:  Abdomen pain flank pain EXAM: CT ABDOMEN AND PELVIS WITHOUT CONTRAST TECHNIQUE: Multidetector CT imaging of the abdomen and pelvis was performed following the standard protocol without IV contrast. RADIATION DOSE REDUCTION: This exam was performed according to the departmental dose-optimization program which includes automated exposure control, adjustment of the mA and/or kV according to patient size and/or use of iterative reconstruction technique. COMPARISON:  CT 10/31/2022 FINDINGS: Lower chest: Lung bases are clear Hepatobiliary: No focal liver abnormality is seen. No gallstones, gallbladder wall thickening, or biliary dilatation. Pancreas: Unremarkable. No pancreatic ductal dilatation or surrounding inflammatory changes. Spleen: Normal in size without focal abnormality. Adrenals/Urinary Tract: Adrenal glands are normal. Mildly atrophic left kidney. No hydronephrosis. Decompressed urinary bladder with Foley catheter. Bladder appears thick walled with perivesical stranding Stomach/Bowel: Stomach nonenlarged. No dilated small bowel. Diverticular disease of left colon. Minimal fat stranding adjacent to the descending colon, for example series 3, image 54 and coronal series 6, image 63 suggestive of mild diverticulitis. Vascular/Lymphatic: Moderate aortic atherosclerosis. No aneurysm. No suspicious lymph nodes. Reproductive: Status post hysterectomy. No adnexal masses. Other: Negative for pelvic effusion or free air Musculoskeletal: No acute osseous abnormality IMPRESSION: 1. Diverticular disease of left colon with minimal fat stranding adjacent to the descending colon suggestive of mild diverticulitis. 2. No hydronephrosis or ureteral stone. Decompressed urinary bladder with Foley catheter. Bladder wall appears thickened with perivesical stranding, correlate with urinalysis for cystitis.  3. Mildly atrophic left kidney 4. Aortic atherosclerosis. Aortic Atherosclerosis (ICD10-I70.0). Electronically Signed   By: Jasmine Pang M.D.   On: 02/19/2023 20:48    Pending Labs Unresulted Labs (From admission, onward)     Start     Ordered   02/20/23 0500  Comprehensive metabolic panel  Tomorrow morning,   R        02/19/23 2358   02/20/23 0500  CBC  Tomorrow morning,   R        02/19/23 2358   02/19/23 2354  Magnesium  Once,   R        02/19/23 2358   02/19/23 2354  Basic metabolic panel  Once,   R        02/19/23 2358   02/19/23 2349  C-reactive protein  Once,   R        02/19/23 2358   02/19/23 2348  T3  Once,   R        02/19/23 2358   02/19/23 2348  T4, free  Once,   R  02/19/23 2358   02/19/23 2348  Procalcitonin  Once,   R       References:    Procalcitonin Lower Respiratory Tract Infection AND Sepsis Procalcitonin Algorithm   02/19/23 2358   02/19/23 2348  Lactic acid, plasma  STAT Now then every 3 hours,   R (with STAT occurrences)      02/19/23 2358   02/19/23 1353  Urine Culture  Once,   R        02/19/23 1353            Vitals/Pain Today's Vitals   02/19/23 2230 02/19/23 2300 02/19/23 2301 02/19/23 2332  BP: (!) 126/51     Pulse: 79 84    Resp: 18 (!) 21    Temp:   98.2 F (36.8 C)   TempSrc:   Oral   SpO2: 96% 94%    Weight:      Height:      PainSc:    3     Isolation Precautions No active isolations  Medications Medications  acetaminophen (TYLENOL) tablet 650 mg (650 mg Oral Given 02/20/23 0022)  rosuvastatin (CRESTOR) tablet 20 mg (has no administration in time range)  DULoxetine (CYMBALTA) DR capsule 20 mg (has no administration in time range)  dapagliflozin propanediol (FARXIGA) tablet 5 mg (has no administration in time range)  insulin glargine-yfgn (SEMGLEE) injection 15 Units (has no administration in time range)  pantoprazole (PROTONIX) EC tablet 40 mg (has no administration in time range)  gabapentin (NEURONTIN) capsule 300  mg (300 mg Oral Given 02/20/23 0023)  levETIRAcetam (KEPPRA) tablet 750 mg (750 mg Oral Given 02/20/23 0022)  rOPINIRole (REQUIP) tablet 1 mg (1 mg Oral Given 02/20/23 0025)  calcium-vitamin D (OSCAL WITH D) 500-5 MG-MCG per tablet 1 tablet (has no administration in time range)  feeding supplement (ENSURE ENLIVE / ENSURE PLUS) liquid 237 mL (has no administration in time range)  ipratropium-albuterol (DUONEB) 0.5-2.5 (3) MG/3ML nebulizer solution 3 mL (has no administration in time range)  insulin aspart (novoLOG) injection 0-9 Units (has no administration in time range)  0.9 %  sodium chloride infusion ( Intravenous New Bag/Given 02/20/23 0030)  ondansetron (ZOFRAN) tablet 4 mg (has no administration in time range)    Or  ondansetron (ZOFRAN) injection 4 mg (has no administration in time range)  LORazepam (ATIVAN) injection 2 mg (has no administration in time range)  piperacillin-tazobactam (ZOSYN) IVPB 3.375 g (has no administration in time range)  sterile water (preservative free) injection ( Other Given 02/19/23 1458)  sodium chloride 0.9 % bolus 500 mL (0 mLs Intravenous Stopped 02/19/23 1729)  sodium zirconium cyclosilicate (LOKELMA) packet 10 g (10 g Oral Given 02/19/23 1550)  insulin aspart (novoLOG) injection 5 Units (5 Units Intravenous Given 02/19/23 1600)    And  dextrose 50 % solution 50 mL (50 mLs Intravenous Given 02/19/23 1551)  fentaNYL (SUBLIMAZE) injection 25 mcg (25 mcg Intravenous Given 02/19/23 1626)  cefTRIAXone (ROCEPHIN) 1 g in sodium chloride 0.9 % 100 mL IVPB (0 g Intravenous Stopped 02/19/23 1730)  piperacillin-tazobactam (ZOSYN) IVPB 3.375 g (0 g Intravenous Stopped 02/19/23 1945)  fentaNYL (SUBLIMAZE) injection 50 mcg (50 mcg Intravenous Given 02/19/23 1924)  morphine (PF) 4 MG/ML injection 4 mg (4 mg Intravenous Given 02/19/23 2255)    Mobility non-ambulatory     Focused Assessments    R Recommendations: See Admitting Provider Note  Report given to:    Additional Notes:

## 2023-02-20 NOTE — Progress Notes (Addendum)
PROGRESS NOTE    CANDIE GINTZ  WUJ:[id number] DOB: 01/30/1936 DOA: 02/19/2023 PCP: Sharee Holster, NP   Brief Narrative:    [name] is a 87 y.o. 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 hypothyroidism who was noted by staff to have suprapubic pain associated with dysuria as well as hematuria x 2 days insetting of chronic foley due to neurogenic bladder. Per notes last foley change was 6/19. Patient due to these symptoms noted on f/u NH visit by pcp was referred to [facility]  Patient was admitted for treatment of complicated UTI as well as concerns for early acute diverticulitis.  Assessment & Plan:   Principal Problem:   UTI (urinary tract infection)  Assessment and Plan:  Complicated Urinary Cath associated UTI with hematuria -admit to med tele  -continue with zosyn  -f/u on urine culture pending -Gentle IV fluid     Early acute diverticulitis  -Advance to soft diet - continue on zosyn    Hematuria  -H/H stable -CT scan notes No hydronephrosis or ureteral stone. Decompressed urinary bladder with Foley catheter. Bladder wall appears thickened with perivesical stranding, correlate with urinalysis for cystitis. -no obvious mass reported  -Exchange Foley catheter   Anemia, progressive -noted drop to 7.9 from 8.8 in setting of hematuria  - continue to trend  - if symptomatic or drop <7 will transfuse  -Follow trend in a.m.   Hyperkalemia  -Repeat potassium still elevated, administer additional dose of Lokelma -Follow-up BMP this afternoon   CKDIII/IV with metabolic acidosis -cr 1.62 now 1.76 ( high ranged 1.75)  - gentle ivfs  - avoid nephrotoxic medications  - monitor urine out put -Started on sodium bicarbonate 3 times daily   Abnormal TSH -Free T40.8 -prior chart  mentions hx of hypothyroidism    Anxiety /depression  -resume home regimen as able    GERD -ppi     DMII  -last A1c 7.5 -iss/fs -resume home regimen once med rec completed as appropriate     HTN -hold medication currently , last bp noted to be lower than baseline  -resume as bp allows     RLS   Central cord syndrome at C4  -associated neurogenic bladder  Obesity BMI 30.7   DVT prophylaxis: SCDs Code Status: DNR Family Communication: Daughter at bedside 6/22 Disposition Plan:  Status is: Inpatient Remains inpatient appropriate because: Need for IV medications.   Consultants:  None  Procedures:  None  Antimicrobials:  Anti-infectives (From admission, onward)    Start     Dose/Rate Route Frequency Ordered Stop   02/20/23 0200  piperacillin-tazobactam (ZOSYN) IVPB 3.375 g        3.375 g 12.5 mL/hr over 240 Minutes Intravenous Every 8 hours 02/20/23 0012     02/20/23 0000  piperacillin-tazobactam (ZOSYN) IVPB 3.375 g  Status:  Discontinued        3.375 g 100 mL/hr over 30 Minutes Intravenous Every 6 hours 02/19/23 2358 02/20/23 0012   02/19/23 1900  piperacillin-tazobactam (ZOSYN) IVPB 3.375 g        3.375 g 100 mL/hr over 30 Minutes Intravenous  Once 02/19/23 1857 02/19/23 1945   02/19/23 1645  cefTRIAXone (ROCEPHIN) 1 g in sodium chloride 0.9 % 100 mL IVPB        1 g 200 mL/hr over 30 Minutes Intravenous  Once 02/19/23 1631 02/19/23 1730       Subjective: Patient seen and evaluated today with no new acute complaints or concerns. No acute concerns or events noted overnight.  She is asking for dietary advancement.  Objective: Vitals:   02/19/23 2300 02/19/23 2301 02/20/23 0118 02/20/23 0428  BP:   (!) 133/52 (!) 127/43  Pulse: 84  83 72  Resp: (!) 21  20   Temp:  98.2 F (36.8 C) 98.3 F (36.8 C) 97.7 F (36.5 C)  TempSrc:  Oral Oral Oral  SpO2: 94%  97% 96%  Weight:      Height:        Intake/Output Summary (Last 24 hours) at 02/20/2023  1610 Last data filed at 02/20/2023 0405 Gross per 24 hour  Intake 1408.73 ml  Output 800 ml  Net 608.73 ml   Filed Weights   02/19/23 1241  Weight: 76.2 kg    Examination:  General exam: Appears calm and comfortable  Respiratory system: Clear to auscultation. Respiratory effort normal. Cardiovascular system: S1 & S2 heard, RRR.  Gastrointestinal system: Abdomen is soft Central nervous system: Alert and awake Extremities: No edema Skin: No significant lesions noted Psychiatry: Flat affect. Foley with scant hematuria noted.    Data Reviewed: I have personally reviewed following labs and imaging studies  CBC: Recent Labs  Lab 02/19/23 1404 02/20/23 0006 02/20/23 0238  WBC 6.0  --  6.2  NEUTROABS 3.2  --   --   HGB 7.9* 7.6* 7.5*  HCT 24.7*  --  24.1*  MCV 95.7  --  95.6  PLT 202  --  186   Basic Metabolic Panel: Recent Labs  Lab 02/19/23 1404 02/19/23 1913 02/20/23 0006 02/20/23 0238  NA 130*  --  134* 133*  K 6.4* 5.4* 5.6* 5.5*  CL 103  --  107 105  CO2 18*  --  17* 14*  GLUCOSE 225*  --  195* 212*  BUN 45*  --  45* 44*  CREATININE 1.76*  --  1.76* 1.79*  CALCIUM 8.7*  --  8.5* 8.5*  MG  --   --  1.8  --    GFR: Estimated Creatinine Clearance: 22.3 mL/min (A) (by C-G formula based on SCr of 1.79 mg/dL (H)). Liver Function Tests: Recent Labs  Lab 02/20/23 0238  AST 15  ALT 15  ALKPHOS 54  BILITOT 0.3  PROT 6.2*  ALBUMIN 3.1*   No results for input(s): "LIPASE", "AMYLASE" in the last 168 hours. No results for input(s): "AMMONIA" in the last 168 hours. Coagulation Profile: Recent Labs  Lab 02/19/23 1519  INR 1.0   Cardiac Enzymes: No results for input(s): "CKTOTAL", "CKMB", "CKMBINDEX", "TROPONINI" in the last 168 hours. BNP (last 3 results) No results for input(s): "PROBNP" in the last 8760 hours. HbA1C: Recent Labs    02/18/23 0800  HGBA1C 7.5*   CBG: Recent Labs  Lab 02/19/23 1534 02/19/23 1913  GLUCAP 200* 201*   Lipid  Profile: No results for input(s): "CHOL", "HDL", "LDLCALC", "TRIG", "CHOLHDL", "LDLDIRECT" in the last 72 hours. Thyroid Function Tests: Recent Labs    02/18/23 0856 02/20/23 0006  TSH 5.113*  --   FREET4  --  0.80   Anemia Panel: No results for input(s): "VITAMINB12", "FOLATE", "FERRITIN", "TIBC", "IRON", "RETICCTPCT" in the last 72 hours. Sepsis Labs: Recent Labs  Lab 02/20/23 0006 02/20/23 0238  PROCALCITON 0.17  --   LATICACIDVEN 1.1 1.2    No results found for this or  any previous visit (from the past 240 hour(s)).       Radiology Studies: CT ABDOMEN PELVIS WO CONTRAST  Result Date: 02/19/2023 CLINICAL DATA:  Abdomen pain flank pain EXAM: CT ABDOMEN AND PELVIS WITHOUT CONTRAST TECHNIQUE: Multidetector CT imaging of the abdomen and pelvis was performed following the standard protocol without IV contrast. RADIATION DOSE REDUCTION: This exam was performed according to the departmental dose-optimization program which includes automated exposure control, adjustment of the mA and/or kV according to patient size and/or use of iterative reconstruction technique. COMPARISON:  CT 10/31/2022 FINDINGS: Lower chest: Lung bases are clear Hepatobiliary: No focal liver abnormality is seen. No gallstones, gallbladder wall thickening, or biliary dilatation. Pancreas: Unremarkable. No pancreatic ductal dilatation or surrounding inflammatory changes. Spleen: Normal in size without focal abnormality. Adrenals/Urinary Tract: Adrenal glands are normal. Mildly atrophic left kidney. No hydronephrosis. Decompressed urinary bladder with Foley catheter. Bladder appears thick walled with perivesical stranding Stomach/Bowel: Stomach nonenlarged. No dilated small bowel. Diverticular disease of left colon. Minimal fat stranding adjacent to the descending colon, for example series 3, image 54 and coronal series 6, image 63 suggestive of mild diverticulitis. Vascular/Lymphatic: Moderate aortic atherosclerosis. No  aneurysm. No suspicious lymph nodes. Reproductive: Status post hysterectomy. No adnexal masses. Other: Negative for pelvic effusion or free air Musculoskeletal: No acute osseous abnormality IMPRESSION: 1. Diverticular disease of left colon with minimal fat stranding adjacent to the descending colon suggestive of mild diverticulitis. 2. No hydronephrosis or ureteral stone. Decompressed urinary bladder with Foley catheter. Bladder wall appears thickened with perivesical stranding, correlate with urinalysis for cystitis. 3. Mildly atrophic left kidney 4. Aortic atherosclerosis. Aortic Atherosclerosis (ICD10-I70.0). Electronically Signed   By: Jasmine Pang M.D.   On: 02/19/2023 20:48        Scheduled Meds:  acetaminophen  650 mg Oral Q8H   calcium-vitamin D  1 tablet Oral BID   dapagliflozin propanediol  5 mg Oral Daily   DULoxetine  20 mg Oral Daily   feeding supplement  237 mL Oral Daily   gabapentin  300 mg Oral TID   insulin aspart  0-9 Units Subcutaneous TID WC   insulin glargine-yfgn  15 Units Subcutaneous Daily   levETIRAcetam  750 mg Oral BID   pantoprazole  40 mg Oral Daily   rOPINIRole  1 mg Oral QHS   rosuvastatin  20 mg Oral QPM   Continuous Infusions:  sodium chloride 75 mL/hr at 02/20/23 0405   piperacillin-tazobactam (ZOSYN)  IV 12.5 mL/hr at 02/20/23 0405     LOS: 1 day    Time spent: 35 minutes    Cherylin Waguespack Hoover Brunette, DO [facility]  If 7PM-7AM, please contact night-coverage [email or web address] 02/20/2023, 6:33 AM

## 2023-02-21 DIAGNOSIS — R319 Hematuria, unspecified: Secondary | ICD-10-CM | POA: Diagnosis not present

## 2023-02-21 DIAGNOSIS — R569 Unspecified convulsions: Secondary | ICD-10-CM | POA: Diagnosis not present

## 2023-02-21 DIAGNOSIS — N390 Urinary tract infection, site not specified: Secondary | ICD-10-CM

## 2023-02-21 LAB — URINALYSIS, ROUTINE W REFLEX MICROSCOPIC
Bilirubin Urine: NEGATIVE
Glucose, UA: >=500 mg/dL — AB
Ketones, ur: NEGATIVE mg/dL
Leukocytes,Ua: NEGATIVE
Nitrite: NEGATIVE
Protein, ur: 30 mg/dL — AB
Specific Gravity, Urine: 1.007 (ref 1.005–1.030)
pH: 5 (ref 5.0–8.0)

## 2023-02-21 LAB — GLUCOSE, CAPILLARY
Glucose-Capillary: 165 mg/dL — ABNORMAL HIGH (ref 70–99)
Glucose-Capillary: 227 mg/dL — ABNORMAL HIGH (ref 70–99)
Glucose-Capillary: 218 mg/dL — ABNORMAL HIGH (ref 70–99)
Glucose-Capillary: 250 mg/dL — ABNORMAL HIGH (ref 70–99)

## 2023-02-21 LAB — FOLATE: Folate: 26.1 ng/mL (ref >5.9)

## 2023-02-21 LAB — CBC
HCT: 22.4 % — ABNORMAL LOW (ref 36.0–46.0)
Hemoglobin: 7.3 g/dL — ABNORMAL LOW (ref 12.0–15.0)
MCH: 30.9 pg (ref 26.0–34.0)
MCHC: 32.6 g/dL (ref 30.0–36.0)
MCV: 94.9 fL (ref 80.0–100.0)
Platelets: 184 10*3/uL (ref 150–400)
RBC: 2.36 MIL/uL — ABNORMAL LOW (ref 3.87–5.11)
RDW: 13.9 % (ref 11.5–15.5)
WBC: 5.3 10*3/uL (ref 4.0–10.5)
nRBC: 0 % (ref 0.0–0.2)

## 2023-02-21 LAB — BASIC METABOLIC PANEL
Anion gap: 9 (ref 5–15)
BUN: 43 mg/dL — ABNORMAL HIGH (ref 8–23)
CO2: 18 mmol/L — ABNORMAL LOW (ref 22–32)
Calcium: 8.5 mg/dL — ABNORMAL LOW (ref 8.9–10.3)
Chloride: 106 mmol/L (ref 98–111)
Creatinine, Ser: 1.91 mg/dL — ABNORMAL HIGH (ref 0.44–1.00)
GFR, Estimated: 26 mL/min — ABNORMAL LOW (ref >60)
Glucose, Bld: 190 mg/dL — ABNORMAL HIGH (ref 70–99)
Potassium: 4.6 mmol/L (ref 3.5–5.1)
Sodium: 133 mmol/L — ABNORMAL LOW (ref 135–145)

## 2023-02-21 LAB — FERRITIN: Ferritin: 139 ng/mL (ref 11–307)

## 2023-02-21 LAB — IRON AND TIBC
Iron: 26 ug/dL — ABNORMAL LOW (ref 28–170)
Saturation Ratios: 10 % — ABNORMAL LOW (ref 10.4–31.8)
TIBC: 266 ug/dL (ref 250–450)
UIBC: 240 ug/dL

## 2023-02-21 LAB — CK: Total CK: 46 U/L (ref 38–234)

## 2023-02-21 LAB — VITAMIN B12: Vitamin B-12: 781 pg/mL (ref 180–914)

## 2023-02-21 LAB — MAGNESIUM: Magnesium: 2.2 mg/dL (ref 1.7–2.4)
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
## 2023-02-21 NOTE — Significant Event (Signed)
Pts family called nurse into the room at [phone] for seizure activity. Charge nurse and this nurse went into room and pt was actively having a seizure. Upper extremities were having tremors. Jaw was unclenched and shut. No abnormal eye movement. Rapid nurse was called for elevated blood pressure. Seizure lasted 13 minutes and ended after 2mg  of Ativan was given. Pt returned back to baseline at 0842 and was able to follow commands, speak clearly, and move upper extremities without tremors.
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
## 2023-02-22 DIAGNOSIS — R319 Hematuria, unspecified: Secondary | ICD-10-CM | POA: Diagnosis not present

## 2023-02-22 DIAGNOSIS — N390 Urinary tract infection, site not specified: Secondary | ICD-10-CM | POA: Diagnosis not present

## 2023-02-22 LAB — GLUCOSE, CAPILLARY
Glucose-Capillary: 205 mg/dL — ABNORMAL HIGH (ref 70–99)
Glucose-Capillary: 250 mg/dL — ABNORMAL HIGH (ref 70–99)
Glucose-Capillary: 239 mg/dL — ABNORMAL HIGH (ref 70–99)
Glucose-Capillary: 206 mg/dL — ABNORMAL HIGH (ref 70–99)
Glucose-Capillary: 216 mg/dL — ABNORMAL HIGH (ref 70–99)

## 2023-02-22 NOTE — Inpatient Diabetes Management (Signed)
Inpatient Diabetes Program Recommendations  AACE/ADA: New Consensus Statement on Inpatient Glycemic Control (2015)  Target Ranges:  Prepandial:   less than 140 mg/dL      Peak postprandial:   less than 180 mg/dL (1-2 hours)      Critically ill patients:  140 - 180 mg/dL   Lab Results  Component Value Date   GLUCAP 205 (H) 02/22/2023   HGBA1C 7.5 (H) 02/18/2023    Latest Reference Range & Units 02/21/23 07:58 02/21/23 12:19 02/21/23 16:48 02/21/23 20:58 02/22/23 07:16  Glucose-Capillary 70 - 99 mg/dL 409 (H) 811 (H) 914 (H) 250 (H) 205 (H)  (H): Data is abnormally high  Diabetes history: DM2 Outpatient Diabetes medications: Lantus 25 units hs, Novolog 5 units tid meal coverage, Farxiga 5 mg qd Current orders for Inpatient glycemic control: Semglee 15 units q hs, Farxiga 5 mg, Novolog 0-9 units tid  Inpatient Diabetes Program Recommendations:   Postprandial CBGs elevated. Please consider: -Add Novolog 2 units tid meal coverage if eats @ least 50%  Thank you, Darel Hong E. Myles Mallicoat, RN, MSN, CDE  Diabetes Coordinator Inpatient Glycemic Control Team Team Pager [phone] (8am-5pm) 02/22/2023 9:25 AM

## 2023-02-22 NOTE — Plan of Care (Signed)
Patient alert/oriented X4. Patient compliant with medication administration and properly covered with insulin. Patient turned Q2 hours and new foley catheter 14 french inserted due to previous catheter leaking with another RN at bedside. Patient tolerated insertion of new foley catheter and has clear urine yellow output. No signs of a potential seizure this shift. Patient VSS, no complaints at this time.   Problem: Education: Goal: Knowledge of General Education information will improve Description: Including pain rating scale, medication(s)/side effects and non-pharmacologic comfort measures Outcome: Progressing   Problem: Health Behavior/Discharge Planning: Goal: Ability to manage health-related needs will improve Outcome: Progressing   Problem: Clinical Measurements: Goal: Ability to maintain clinical measurements within normal limits will improve Outcome: Progressing   Problem: Clinical Measurements: Goal: Will remain free from infection Outcome: Progressing   Problem: Clinical Measurements: Goal: Diagnostic test results will improve Outcome: Progressing   Problem: Clinical Measurements: Goal: Respiratory complications will improve Outcome: Progressing   Problem: Clinical Measurements: Goal: Cardiovascular complication will be avoided Outcome: Progressing   Problem: Clinical Measurements: Goal: Cardiovascular complication will be avoided Outcome: Progressing   Problem: Activity: Goal: Risk for activity intolerance will decrease Outcome: Progressing   Problem: Nutrition: Goal: Adequate nutrition will be maintained Outcome: Progressing   Problem: Coping: Goal: Level of anxiety will decrease Outcome: Progressing   Problem: Elimination: Goal: Will not experience complications related to bowel motility Outcome: Progressing   Problem: Elimination: Goal: Will not experience complications related to urinary retention Outcome: Progressing   Problem: Pain  Managment: Goal: General experience of comfort will improve Outcome: Progressing   Problem: Safety: Goal: Ability to remain free from injury will improve Outcome: Progressing   Problem: Skin Integrity: Goal: Risk for impaired skin integrity will decrease Outcome: Progressing

## 2023-02-22 NOTE — Care Management Important Message (Signed)
Important Message  Patient Details  Name: [name] MRN: [id number] Date of Birth: 04/23/1936   Medicare Important Message Given:  Yes  Called and spoke the patient daughter advised her of the IM and she as me to mail it to her Mom PO box   Dorena Bodo 02/22/2023, 2:24 PM

## 2023-02-22 NOTE — Consult Note (Addendum)
Urology Consult  Referring physician: Dr. Dartha Lodge Reason for referral: hematuria  Chief Complaint: hematuria  History of Present Illness: Patient is an 87 year old woman with chronic indwelling Foley catheter initially presenting to [facility] after hematuria x 2 days.  She was transferred to [facility] for higher level of care.  I met with her and her daughter this morning.  On my arrival patient had clear yellow urine.  Looking back on her initial urinalysis she was most likely experiencing a hemorrhagic cystitis.  Repeat urinalysis shows rare bacteria and clear in color.  [facility] has been consulted to speak to her hematuria.  Past Medical History:  Diagnosis Date   Adult failure to thrive    Anemia    Anxiety    Atherosclerosis of aorta (HCC)    Central cord syndrome at C4 level of cervical spinal cord, subsequent encounter (HCC)    CKD (chronic kidney disease)    stage 3   Depression    DM type 2 with diabetic peripheral neuropathy (HCC)    Dysphagia    GERD (gastroesophageal reflux disease)    Gout    High cholesterol    HTN (hypertension)    Hyponatremia    MDD (major depressive disorder)    Neck pain    Neuropathy    Quadriplegia, C1-C4 incomplete (HCC)    Radiculopathy    RLS (restless legs syndrome)    Urinary retention    Past Surgical History:  Procedure Laterality Date   ABDOMINAL HYSTERECTOMY     ANTERIOR CERVICAL DECOMP/DISCECTOMY FUSION N/A 02/05/2021   Procedure: Cervical Three-Four  Anterior cervical decompression/discectomy/fusion;  Surgeon: Coletta Memos, MD;  Location: [facility] OR;  Service: Neurosurgery;  Laterality: N/A;  RM 20   APPENDECTOMY     BACK SURGERY     BALLOON DILATION N/A 11/19/2022   Procedure: BALLOON DILATION;  Surgeon: Lanelle Bal, DO;  Location: [facility];  Service: Endoscopy;  Laterality: N/A;   BIOPSY  09/22/2021   Procedure: BIOPSY;  Surgeon: Lanelle Bal, DO;  Location: [facility];  Service:  Endoscopy;;   BIOPSY  11/19/2022   Procedure: BIOPSY;  Surgeon: Lanelle Bal, DO;  Location: [facility];  Service: Endoscopy;;   CERVICAL DISC SURGERY     ESOPHAGOGASTRODUODENOSCOPY (EGD) WITH PROPOFOL N/A 09/22/2021   Procedure: ESOPHAGOGASTRODUODENOSCOPY (EGD) WITH PROPOFOL;  Surgeon: Lanelle Bal, DO;  [facility];  Service: Endoscopy;  Laterality: N/A;  1:00pm   ESOPHAGOGASTRODUODENOSCOPY (EGD) WITH PROPOFOL N/A 11/19/2022   Procedure: ESOPHAGOGASTRODUODENOSCOPY (EGD) WITH PROPOFOL;  Surgeon: Lanelle Bal, DO;  Location[facility];  Service: Endoscopy;  Laterality: N/A;  11:30 am, asa 3   HAND SURGERY     KNEE SURGERY      Medications: I have reviewed the patient's current medications. Allergies:  Allergies  Allergen Reactions   Ace Inhibitors Other (See Comments)    Hyperkalemia    Codeine     Unknown reaction   Sulfa Antibiotics     Unknown reaction    Family History  Problem Relation Age of Onset   Cancer Mother    Cardiomyopathy Father    Seizures Sister        childhood   Seizures Brother        in his 58s   Colon cancer Neg Hx    Social History:  reports that she has never smoked. She has never used smokeless tobacco. She reports that she does not drink  alcohol and does not use drugs.  ROS: All systems are reviewed and negative except as noted. Hematuria  Physical Exam:  Vital signs in last 24 hours: Temp:  [97.8 F (36.6 C)-99.6 F (37.6 C)] 98.3 F (36.8 C) (06/24 0808) Pulse Rate:  [70-99] 81 (06/24 0808) Resp:  [16-18] 18 (06/24 0808) BP: (103-154)/(48-65) 146/58 (06/24 0808) SpO2:  [95 %-100 %] 95 % (06/24 0808) Weight:  [84.5 kg] 84.5 kg (06/24 0500)  Cardiovascular: Skin warm; not flushed Respiratory: Breaths quiet; no shortness of breath Abdomen: No masses, soft, no guarding or rebound Neurological: Normal sensation to touch Musculoskeletal: Normal motor function arms and legs Skin: No rashes Genitourinary:  Foley catheter in place draining clear yellow urine  Laboratory Data:  Results for orders placed or performed during the hospital encounter of 02/19/23 (from the past 72 hour(s))  CBC with Differential     Status: Abnormal   Collection Time: 02/19/23  2:04 PM  Result Value Ref Range   WBC 6.0 4.0 - 10.5 K/uL   RBC 2.58 (L) 3.87 - 5.11 MIL/uL   Hemoglobin 7.9 (L) 12.0 - 15.0 g/dL   HCT 89.3 (L) 81.0 - 17.5 %   MCV 95.7 80.0 - 100.0 fL   MCH 30.6 26.0 - 34.0 pg   MCHC 32.0 30.0 - 36.0 g/dL   RDW 10.2 58.5 - 27.7 %   Platelets 202 150 - 400 K/uL   nRBC 0.0 0.0 - 0.2 %   Neutrophils Relative % 53 %   Neutro Abs 3.2 1.7 - 7.7 K/uL   Lymphocytes Relative 34 %   Lymphs Abs 2.0 0.7 - 4.0 K/uL   Monocytes Relative 6 %   Monocytes Absolute 0.4 0.1 - 1.0 K/uL   Eosinophils Relative 7 %   Eosinophils Absolute 0.4 0.0 - 0.5 K/uL   Basophils Relative 0 %   Basophils Absolute 0.0 0.0 - 0.1 K/uL   Immature Granulocytes 0 %   Abs Immature Granulocytes 0.02 0.00 - 0.07 K/uL    Comment: Performed at [facility], [address], [place], [place] [address]  Basic metabolic panel     Status: Abnormal   Collection Time: 02/19/23  2:04 PM  Result Value Ref Range   Sodium 130 (L) 135 - 145 mmol/L   Potassium 6.4 (HH) 3.5 - 5.1 mmol/L    Comment: CRITICAL RESULT CALLED TO, READ BACK BY AND VERIFIED WITH  AVERY, D AT 1445 ON 02/19/23 BY SMN.   Chloride 103 98 - 111 mmol/L   CO2 18 (L) 22 - 32 mmol/L   Glucose, Bld 225 (H) 70 - 99 mg/dL    Comment: Glucose reference range applies only to samples taken after fasting for at least 8 hours.   BUN 45 (H) 8 - 23 mg/dL   Creatinine, Ser 5.36 (H) 0.44 - 1.00 mg/dL   Calcium 8.7 (L) 8.9 - 10.3 mg/dL   GFR, Estimated 28 (L) >60 mL/min    Comment: (NOTE) Calculated using the CKD-EPI Creatinine Equation (2021)    Anion gap 9 5 - 15    Comment: Performed at [facility], [address] [place], [place] [address]  Protime-INR     Status: None   Collection  Time: 02/19/23  3:19 PM  Result Value Ref Range   Prothrombin Time 13.2 11.4 - 15.2 seconds   INR 1.0 0.8 - 1.2    Comment: (NOTE) INR goal varies based on device and disease states. Performed at [facility], [address]  [place], [place] [address]   CBG monitoring, ED     Status: Abnormal   Collection Time: 02/19/23  3:34 PM  Result Value Ref Range   Glucose-Capillary 200 (H) 70 - 99 mg/dL    Comment: Glucose reference range applies only to samples taken after fasting for at least 8 hours.  Urinalysis, Routine w reflex microscopic -Urine, Clean Catch     Status: Abnormal   Collection Time: 02/19/23  4:13 PM  Result Value Ref Range   Color, Urine RED (A) YELLOW    Comment: BIOCHEMICALS MAY BE AFFECTED BY COLOR   APPearance CLOUDY (A) CLEAR   Specific Gravity, Urine 1.007 1.005 - 1.030   pH 6.0 5.0 - 8.0   Glucose, UA >=500 (A) NEGATIVE mg/dL   Hgb urine dipstick MODERATE (A) NEGATIVE   Bilirubin Urine NEGATIVE NEGATIVE   Ketones, ur NEGATIVE NEGATIVE mg/dL   Protein, ur 295 (A) NEGATIVE mg/dL   Nitrite NEGATIVE NEGATIVE   Leukocytes,Ua LARGE (A) NEGATIVE   RBC / HPF >50 0 - 5 RBC/hpf   WBC, UA >50 0 - 5 WBC/hpf   Bacteria, UA MANY (A) NONE SEEN   Squamous Epithelial / HPF 0-5 0 - 5 /HPF   WBC Clumps PRESENT    Hyaline Casts, UA PRESENT     Comment: Performed at [facility], [address] [place], [place] [address]  Potassium     Status: Abnormal   Collection Time: 02/19/23  7:13 PM  Result Value Ref Range   Potassium 5.4 (H) 3.5 - 5.1 mmol/L    Comment: Performed at [facility], [address], [place], [place] [address]  CBG monitoring, ED     Status: Abnormal   Collection Time: 02/19/23  7:13 PM  Result Value Ref Range   Glucose-Capillary 201 (H) 70 - 99 mg/dL    Comment: Glucose reference range applies only to samples taken after fasting for at least 8 hours.  Type and screen [facility]     Status: None   Collection Time: 02/20/23 12:05  AM  Result Value Ref Range   ABO/RH(D) B POS    Antibody Screen NEG    Sample Expiration      02/23/2023,2359 Performed at [facility], [address], [place], [place] [address]   T4, free     Status: None   Collection Time: 02/20/23 12:06 AM  Result Value Ref Range   Free T4 0.80 0.61 - 1.12 ng/dL    Comment: (NOTE) Biotin ingestion may interfere with free T4 tests. If the results are inconsistent with the TSH level, previous test results, or the clinical presentation, then consider biotin interference. If needed, order repeat testing after stopping biotin. Performed at [facility], [address] [place], [place] [address]   Procalcitonin     Status: None   Collection Time: 02/20/23 12:06 AM  Result Value Ref Range   Procalcitonin 0.17 ng/mL    Comment:        Interpretation: PCT (Procalcitonin) <= 0.5 ng/mL: Systemic infection (sepsis) is not likely. Local bacterial infection is possible. (NOTE)       Sepsis PCT Algorithm           Lower Respiratory Tract                                      Infection PCT Algorithm    ----------------------------     ----------------------------  PCT < 0.25 ng/mL                PCT < 0.10 ng/mL          Strongly encourage             Strongly discourage   discontinuation of antibiotics    initiation of antibiotics    ----------------------------     -----------------------------       PCT 0.25 - 0.50 ng/mL            PCT 0.10 - 0.25 ng/mL               OR       >80% decrease in PCT            Discourage initiation of                                            antibiotics      Encourage discontinuation           of antibiotics    ----------------------------     -----------------------------         PCT >= 0.50 ng/mL              PCT 0.26 - 0.50 ng/mL               AND        <80% decrease in PCT             Encourage initiation of                                             antibiotics       Encourage  continuation           of antibiotics    ----------------------------     -----------------------------        PCT >= 0.50 ng/mL                  PCT > 0.50 ng/mL               AND         increase in PCT                  Strongly encourage                                      initiation of antibiotics    Strongly encourage escalation           of antibiotics                                     -----------------------------                                           PCT <= 0.25 ng/mL  OR                                        > 80% decrease in PCT                                      Discontinue / Do not initiate                                             antibiotics  Performed at [facility], [address] [place], [place] [address]   Lactic acid, plasma     Status: None   Collection Time: 02/20/23 12:06 AM  Result Value Ref Range   Lactic Acid, Venous 1.1 0.5 - 1.9 mmol/L    Comment: Performed at [facility], [address] [place], [place] [address]  C-reactive protein     Status: Abnormal   Collection Time: 02/20/23 12:06 AM  Result Value Ref Range   CRP 5.0 (H) <1.0 mg/dL    Comment: Performed at [facility], [address] [place], [place] [address]  Magnesium     Status: None   Collection Time: 02/20/23 12:06 AM  Result Value Ref Range   Magnesium 1.8 1.7 - 2.4 mg/dL    Comment: Performed at [facility], [address], [place], [place] [address]  Basic metabolic panel     Status: Abnormal   Collection Time: 02/20/23 12:06 AM  Result Value Ref Range   Sodium 134 (L) 135 - 145 mmol/L   Potassium 5.6 (H) 3.5 - 5.1 mmol/L   Chloride 107 98 - 111 mmol/L   CO2 17 (L) 22 - 32 mmol/L   Glucose, Bld 195 (H) 70 - 99 mg/dL    Comment: Glucose reference range applies only to samples taken after fasting for at least 8 hours.   BUN 45 (H) 8 - 23 mg/dL   Creatinine, Ser 2.95 (H) 0.44 - 1.00 mg/dL    Calcium 8.5 (L) 8.9 - 10.3 mg/dL   GFR, Estimated 28 (L) >60 mL/min    Comment: (NOTE) Calculated using the CKD-EPI Creatinine Equation (2021)    Anion gap 10 5 - 15    Comment: Performed at [facility], [address], [place], [place] [address]  Hemoglobin     Status: Abnormal   Collection Time: 02/20/23 12:06 AM  Result Value Ref Range   Hemoglobin 7.6 (L) 12.0 - 15.0 g/dL    Comment: Performed at [facility], [address], [place], [place] [address]  Lactic acid, plasma     Status: None   Collection Time: 02/20/23  2:38 AM  Result Value Ref Range   Lactic Acid, Venous 1.2 0.5 - 1.9 mmol/L    Comment: Performed at [facility], [address] [place], [place] [address]  Comprehensive metabolic panel     Status: Abnormal   Collection Time: 02/20/23  2:38 AM  Result Value Ref Range   Sodium 133 (L) 135 - 145 mmol/L   Potassium 5.5 (H) 3.5 - 5.1 mmol/L   Chloride 105 98 - 111 mmol/L   CO2 14 (L) 22 - 32  mmol/L   Glucose, Bld 212 (H) 70 - 99 mg/dL    Comment: Glucose reference range applies only to samples taken after fasting for at least 8 hours.   BUN 44 (H) 8 - 23 mg/dL   Creatinine, Ser 8.09 (H) 0.44 - 1.00 mg/dL   Calcium 8.5 (L) 8.9 - 10.3 mg/dL   Total Protein 6.2 (L) 6.5 - 8.1 g/dL   Albumin 3.1 (L) 3.5 - 5.0 g/dL   AST 15 15 - 41 U/L   ALT 15 0 - 44 U/L   Alkaline Phosphatase 54 38 - 126 U/L   Total Bilirubin 0.3 0.3 - 1.2 mg/dL   GFR, Estimated 28 (L) >60 mL/min    Comment: (NOTE) Calculated using the CKD-EPI Creatinine Equation (2021)    Anion gap 14 5 - 15    Comment: Performed at [facility], [address] [place], [place] [address]  CBC     Status: Abnormal   Collection Time: 02/20/23  2:38 AM  Result Value Ref Range   WBC 6.2 4.0 - 10.5 K/uL   RBC 2.52 (L) 3.87 - 5.11 MIL/uL   Hemoglobin 7.5 (L) 12.0 - 15.0 g/dL   HCT 25.0 (L) 53.9 - 76.7 %   MCV 95.6 80.0 - 100.0 fL   MCH 29.8 26.0 - 34.0 pg   MCHC 31.1 30.0 - 36.0 g/dL    RDW 34.1 93.7 - 90.2 %   Platelets 186 150 - 400 K/uL   nRBC 0.0 0.0 - 0.2 %    Comment: Performed at [facility], [address] [place], [place] [address]  Glucose, capillary     Status: Abnormal   Collection Time: 02/20/23  8:30 AM  Result Value Ref Range   Glucose-Capillary 234 (H) 70 - 99 mg/dL    Comment: Glucose reference range applies only to samples taken after fasting for at least 8 hours.  Basic metabolic panel     Status: Abnormal   Collection Time: 02/20/23 12:48 PM  Result Value Ref Range   Sodium 133 (L) 135 - 145 mmol/L   Potassium 5.5 (H) 3.5 - 5.1 mmol/L   Chloride 105 98 - 111 mmol/L   CO2 17 (L) 22 - 32 mmol/L   Glucose, Bld 257 (H) 70 - 99 mg/dL    Comment: Glucose reference range applies only to samples taken after fasting for at least 8 hours.   BUN 46 (H) 8 - 23 mg/dL   Creatinine, Ser 5.32 (H) 0.44 - 1.00 mg/dL   Calcium 8.3 (L) 8.9 - 10.3 mg/dL   GFR, Estimated 28 (L) >60 mL/min    Comment: (NOTE) Calculated using the CKD-EPI Creatinine Equation (2021)    Anion gap 11 5 - 15    Comment: Performed at [facility], [address] [place], [place] [address]  Glucose, capillary     Status: Abnormal   Collection Time: 02/20/23  2:59 PM  Result Value Ref Range   Glucose-Capillary 244 (H) 70 - 99 mg/dL    Comment: Glucose reference range applies only to samples taken after fasting for at least 8 hours.   Comment 1 Notify RN    Comment 2 Document in Chart   Glucose, capillary     Status: Abnormal   Collection Time: 02/20/23  4:33 PM  Result Value Ref Range   Glucose-Capillary 210 (H) 70 - 99 mg/dL    Comment: Glucose reference range applies only to samples taken after fasting for  at least 8 hours.   Comment 1 Notify RN    Comment 2 Document in Chart   Glucose, capillary     Status: Abnormal   Collection Time: 02/20/23  9:15 PM  Result Value Ref Range   Glucose-Capillary 190 (H) 70 - 99 mg/dL    Comment: Glucose reference range applies  only to samples taken after fasting for at least 8 hours.  Basic metabolic panel     Status: Abnormal   Collection Time: 02/21/23  5:42 AM  Result Value Ref Range   Sodium 133 (L) 135 - 145 mmol/L   Potassium 4.6 3.5 - 5.1 mmol/L   Chloride 106 98 - 111 mmol/L   CO2 18 (L) 22 - 32 mmol/L   Glucose, Bld 190 (H) 70 - 99 mg/dL    Comment: Glucose reference range applies only to samples taken after fasting for at least 8 hours.   BUN 43 (H) 8 - 23 mg/dL   Creatinine, Ser 6.96 (H) 0.44 - 1.00 mg/dL   Calcium 8.5 (L) 8.9 - 10.3 mg/dL   GFR, Estimated 26 (L) >60 mL/min    Comment: (NOTE) Calculated using the CKD-EPI Creatinine Equation (2021)    Anion gap 9 5 - 15    Comment: Performed at [facility], [address], [place], [place] [address]  Magnesium     Status: None   Collection Time: 02/21/23  5:42 AM  Result Value Ref Range   Magnesium 2.2 1.7 - 2.4 mg/dL    Comment: Performed at [facility], [address] [place], [place] [address]  CBC     Status: Abnormal   Collection Time: 02/21/23  5:42 AM  Result Value Ref Range   WBC 5.3 4.0 - 10.5 K/uL   RBC 2.36 (L) 3.87 - 5.11 MIL/uL   Hemoglobin 7.3 (L) 12.0 - 15.0 g/dL   HCT 40.1 (L) 02.7 - 25.3 %   MCV 94.9 80.0 - 100.0 fL   MCH 30.9 26.0 - 34.0 pg   MCHC 32.6 30.0 - 36.0 g/dL   RDW 66.4 40.3 - 47.4 %   Platelets 184 150 - 400 K/uL   nRBC 0.0 0.0 - 0.2 %    Comment: Performed at [facility], [address], [place], [place] [address]  Glucose, capillary     Status: Abnormal   Collection Time: 02/21/23  7:58 AM  Result Value Ref Range   Glucose-Capillary 165 (H) 70 - 99 mg/dL    Comment: Glucose reference range applies only to samples taken after fasting for at least 8 hours.  CK     Status: None   Collection Time: 02/21/23 11:26 AM  Result Value Ref Range   Total CK 46 38 - 234 U/L    Comment: Performed at [facility], [address], [place], [place] [address]  Vitamin B12     Status: None    Collection Time: 02/21/23 11:26 AM  Result Value Ref Range   Vitamin B-12 781 180 - 914 pg/mL    Comment: (NOTE) This assay is not validated for testing neonatal or myeloproliferative syndrome specimens for Vitamin B12 levels. Performed at [facility], [address], [place], [place] [address]   Folate     Status: None   Collection Time: 02/21/23 11:26 AM  Result Value Ref Range   Folate 26.1 >5.9 ng/mL    Comment: Performed at [facility], [address], [place],  [place] [address]  Iron and TIBC     Status: Abnormal   Collection Time: 02/21/23 11:26 AM  Result Value Ref Range   Iron 26 (L) 28 - 170 ug/dL   TIBC 841 324 - 401 ug/dL   Saturation Ratios 10 (L) 10.4 - 31.8 %   UIBC 240 ug/dL    Comment: Performed at [facility], [address] [place], [place] [address]  Ferritin     Status: None   Collection Time: 02/21/23 11:26 AM  Result Value Ref Range   Ferritin 139 11 - 307 ng/mL    Comment: Performed at [facility], [address], [place], [place] [address]  Glucose, capillary     Status: Abnormal   Collection Time: 02/21/23 12:19 PM  Result Value Ref Range   Glucose-Capillary 227 (H) 70 - 99 mg/dL    Comment: Glucose reference range applies only to samples taken after fasting for at least 8 hours.   Comment 1 Notify RN    Comment 2 Document in Chart   Urinalysis, Routine w reflex microscopic -Urine, Clean Catch     Status: Abnormal   Collection Time: 02/21/23  1:00 PM  Result Value Ref Range   Color, Urine YELLOW YELLOW   APPearance HAZY (A) CLEAR   Specific Gravity, Urine 1.007 1.005 - 1.030   pH 5.0 5.0 - 8.0   Glucose, UA >=500 (A) NEGATIVE mg/dL   Hgb urine dipstick LARGE (A) NEGATIVE   Bilirubin Urine NEGATIVE NEGATIVE   Ketones, ur NEGATIVE NEGATIVE mg/dL   Protein, ur 30 (A) NEGATIVE mg/dL   Nitrite NEGATIVE NEGATIVE   Leukocytes,Ua NEGATIVE NEGATIVE   RBC / HPF 21-50 0 - 5 RBC/hpf   WBC, UA 0-5 0 - 5 WBC/hpf   Bacteria, UA  RARE (A) NONE SEEN   Squamous Epithelial / HPF 0-5 0 - 5 /HPF   Mucus PRESENT    Budding Yeast PRESENT     Comment: Performed at [facility], [address] [place], [place] [address]  Glucose, capillary     Status: Abnormal   Collection Time: 02/21/23  4:48 PM  Result Value Ref Range   Glucose-Capillary 218 (H) 70 - 99 mg/dL    Comment: Glucose reference range applies only to samples taken after fasting for at least 8 hours.  Glucose, capillary     Status: Abnormal   Collection Time: 02/21/23  8:58 PM  Result Value Ref Range   Glucose-Capillary 250 (H) 70 - 99 mg/dL    Comment: Glucose reference range applies only to samples taken after fasting for at least 8 hours.  Glucose, capillary     Status: Abnormal   Collection Time: 02/22/23  7:16 AM  Result Value Ref Range   Glucose-Capillary 205 (H) 70 - 99 mg/dL    Comment: Glucose reference range applies only to samples taken after fasting for at least 8 hours.  Glucose, capillary     Status: Abnormal   Collection Time: 02/22/23  1:24 PM  Result Value Ref Range   Glucose-Capillary 250 (H) 70 - 99 mg/dL    Comment: Glucose reference range applies only to samples taken after fasting for at least 8 hours.   Recent Results (from the past 240 hour(s))  Urine Culture     Status: Abnormal   Collection Time: 02/19/23 10:12 AM   Specimen: Urine, Clean Catch  Result Value Ref Range Status   Specimen Description   Final    URINE,  CLEAN CATCH Performed at [facility], [address] [place], [place] [address]    Special Requests   Final    NONE Performed at [facility], [address], [place], [place] [address]    Culture MULTIPLE SPECIES PRESENT, SUGGEST RECOLLECTION (A)  Final   Report Status 02/20/2023 FINAL  Final   Creatinine: Recent Labs    02/19/23 1404 02/20/23 0006 02/20/23 0238 02/20/23 1248 02/21/23 0542  CREATININE 1.76* 1.76* 1.79* 1.78* 1.91*    Imaging: See report/chart Independent review of CT A/P  shows no signs of nephrolithiasis or obstruction.  Bladder is decompressed with Foley catheter in place.  Bladder wall is notably thickened, consistent with cystitis suggested by urinalysis.  Assessment/Plan:  # Hemorrhagic cystitis Broad-spectrum ABX per primary team, tailored to culture data Initial urinalysis consistent with cystitis and UTI.  Repeat on 6/23 showed significant improvement in bacterial burden.  Gross hematuria has resolved as of today. Patient would benefit from an office cystoscopy/hematuria workup.  She is native to [place].  One of her granddaughters who will accompany her to her appointment participated in the conversation by phone.  I have gotten her cell phone number and she will be contacted by our office. Urology will sign off at this time.  Please call with questions.   Scherrie Bateman [place] 02/22/2023, 1:54 PM  Pager: [phone] Agree with assessment/plan
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
## 2023-02-23 DIAGNOSIS — N3001 Acute cystitis with hematuria: Secondary | ICD-10-CM | POA: Diagnosis not present

## 2023-02-23 LAB — RENAL FUNCTION PANEL
Albumin: 2.9 g/dL — ABNORMAL LOW (ref 3.5–5.0)
Anion gap: 10 (ref 5–15)
BUN: 35 mg/dL — ABNORMAL HIGH (ref 8–23)
CO2: 23 mmol/L (ref 22–32)
Calcium: 8.7 mg/dL — ABNORMAL LOW (ref 8.9–10.3)
Chloride: 105 mmol/L (ref 98–111)
Creatinine, Ser: 1.74 mg/dL — ABNORMAL HIGH (ref 0.44–1.00)
GFR, Estimated: 29 mL/min — ABNORMAL LOW (ref >60)
Glucose, Bld: 238 mg/dL — ABNORMAL HIGH (ref 70–99)
Phosphorus: 4.5 mg/dL (ref 2.5–4.6)
Potassium: 4.2 mmol/L (ref 3.5–5.1)
Sodium: 138 mmol/L (ref 135–145)

## 2023-02-23 LAB — CBC WITH DIFFERENTIAL/PLATELET
Abs Immature Granulocytes: 0.01 10*3/uL (ref 0.00–0.07)
Basophils Absolute: 0 10*3/uL (ref 0.0–0.1)
Basophils Relative: 0 %
Eosinophils Absolute: 0.4 10*3/uL (ref 0.0–0.5)
Eosinophils Relative: 8 %
HCT: 21.5 % — ABNORMAL LOW (ref 36.0–46.0)
Hemoglobin: 7 g/dL — ABNORMAL LOW (ref 12.0–15.0)
Immature Granulocytes: 0 %
Lymphocytes Relative: 35 %
Lymphs Abs: 1.8 10*3/uL (ref 0.7–4.0)
MCH: 31.7 pg (ref 26.0–34.0)
MCHC: 32.6 g/dL (ref 30.0–36.0)
MCV: 97.3 fL (ref 80.0–100.0)
Monocytes Absolute: 0.4 10*3/uL (ref 0.1–1.0)
Monocytes Relative: 8 %
Neutro Abs: 2.4 10*3/uL (ref 1.7–7.7)
Neutrophils Relative %: 49 %
Platelets: 192 10*3/uL (ref 150–400)
RBC: 2.21 MIL/uL — ABNORMAL LOW (ref 3.87–5.11)
RDW: 13.7 % (ref 11.5–15.5)
WBC: 5.1 10*3/uL (ref 4.0–10.5)
nRBC: 0 % (ref 0.0–0.2)

## 2023-02-23 LAB — GLUCOSE, CAPILLARY
Glucose-Capillary: 210 mg/dL — ABNORMAL HIGH (ref 70–99)
Glucose-Capillary: 247 mg/dL — ABNORMAL HIGH (ref 70–99)
Glucose-Capillary: 247 mg/dL — ABNORMAL HIGH (ref 70–99)
Glucose-Capillary: 251 mg/dL — ABNORMAL HIGH (ref 70–99)
Glucose-Capillary: 231 mg/dL — ABNORMAL HIGH (ref 70–99)

## 2023-02-23 LAB — PREPARE RBC (CROSSMATCH)

## 2023-02-23 LAB — BPAM RBC

## 2023-02-23 LAB — T3: T3, Total: 104 ng/dL (ref 71–180)

## 2023-02-23 LAB — MAGNESIUM: Magnesium: 2.1 mg/dL (ref 1.7–2.4)

## 2023-02-23 LAB — OCCULT BLOOD X 1 CARD TO LAB, STOOL: Fecal Occult Bld: NEGATIVE

## 2023-02-23 MED ORDER — INSULIN ASPART 100 UNIT/ML IJ SOLN
0.0000 [IU] | Freq: Three times a day (TID) | INTRAMUSCULAR | Status: DC
  Administered 2023-02-23 – 2023-02-24 (×2): 3 [IU] via SUBCUTANEOUS
  Administered 2023-02-24: 2 [IU] via SUBCUTANEOUS
  Administered 2023-02-24: 1 [IU] via SUBCUTANEOUS

## 2023-02-23 MED ORDER — OXYCODONE HCL 5 MG PO TABS
5.0000 mg | ORAL_TABLET | Freq: Four times a day (QID) | ORAL | Status: DC | PRN
  Administered 2023-02-23 – 2023-02-24 (×2): 5 mg via ORAL
  Filled 2023-02-23 (×2): qty 1

## 2023-02-23 MED ORDER — SODIUM CHLORIDE 0.9% IV SOLUTION: Freq: Once | INTRAVENOUS | Status: AC

## 2023-02-23 MED ORDER — SODIUM CHLORIDE 0.9 % IV SOLN
250.0000 mg | Freq: Every day | INTRAVENOUS | Status: AC
  Administered 2023-02-23 – 2023-02-24 (×2): 250 mg via INTRAVENOUS
  Filled 2023-02-23 (×2): qty 20

## 2023-02-23 MED ORDER — INSULIN ASPART 100 UNIT/ML IJ SOLN
2.0000 [IU] | Freq: Three times a day (TID) | INTRAMUSCULAR | Status: DC
  Administered 2023-02-23 – 2023-02-24 (×5): 2 [IU] via SUBCUTANEOUS

## 2023-02-23 MED ORDER — ROPINIROLE HCL 1 MG PO TABS
1.0000 mg | ORAL_TABLET | Freq: Every day | ORAL | Status: DC
  Administered 2023-02-23: 1 mg via ORAL
  Filled 2023-02-23: qty 1

## 2023-02-23 NOTE — Progress Notes (Addendum)
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  She has chronic Foley catheter for neurogenic bladder.  Patient was admitted for suspicion of UTI.  Urine culture pending.  Currently on Zosyn  Assessment & Plan:  Principal Problem:   UTI (urinary tract infection)  Complicated UTI/gross hematuria: Has a chronic Foley catheter for neurogenic bladder.  Currently on Zosyn.  Currently afebrile, no leukocytosis.  Urine culture pending.  First urine culture showed mixed organisms.  UA/urine culture repeated.  CT imaging showed thickened bladder wall.  Foley exchanged here.  Will follow-up. Urology was following here.  Recommended cystoscopy as an outpatient.  Early acute diverticulitis: CT abdomen/pelvis done her showed mild left-sided diverticulosis.  Currently tolerating soft diet.  Continue Zosyn.  Complains of left-sided abdominal discomfort  Hematuria/iron deficiency anemia: CT imaging denies any hydronephrosis or ureteral stone.  Her baseline hemoglobin is around 8.  This is most likely secondary to chronic disease.  Hemoglobin in the range of 7 today.  Will give her a unit of PRBC.  Iron studies showed low iron, will give her IV iron  CKD stage IIIb/IV/hyperkalemia: Baseline creatinine 1.6.  Currently kidney function at baseline.  On sodium bicarb tablets  Anxiety/depression: Continue home regimen  GERD: Continue PPI  Diabetes type 2: A1c of 7.5.  Currently on sliding scale.  Monitor blood sugars  Hypertension: Currently blood pressure stable.  Restless leg  syndrome/central cord syndrome at C4: Resulting in neurogenic bladder.  Obesity: BMI 30.7    Pressure Injury 01/29/21 Buttocks Mid full thickness fissure related to moisture to inner gluteal cleft, not a pressure injury (Active)  01/29/21 2200  Location: Buttocks  Location Orientation: Mid  Staging:   Wound Description (Comments): full thickness fissure related to moisture to inner gluteal cleft, not a pressure injury  Present on Admission: Yes  Dressing Type Foam - Lift dressing to assess site every shift 02/23/23 0611    DVT prophylaxis:SCDs Start: 02/19/23 2349     Code Status: DNR  Family Communication: Called daughter [name] on phone on 6/25 twice, calls not received  Patient status:Inpatient  Patient is from :NF  Anticipated discharge to:NF  Estimated DC date:1-2 days   Consultants: None  Procedures:None  Antimicrobials:  Anti-infectives (From admission, onward)    Start     Dose/Rate Route Frequency Ordered Stop   02/20/23 0200  piperacillin-tazobactam (ZOSYN) IVPB 3.375 g        3.375 g 12.5 mL/hr over 240 Minutes Intravenous Every 8 hours 02/20/23 0012     02/20/23 0000  piperacillin-tazobactam (ZOSYN) IVPB 3.375 g  Status:  Discontinued        3.375 g 100 mL/hr over 30 Minutes Intravenous Every 6 hours 02/19/23 2358 02/20/23 0012   02/19/23 1900  piperacillin-tazobactam (ZOSYN) IVPB 3.375 g        3.375 g 100 mL/hr over 30 Minutes Intravenous  Once 02/19/23 1857 02/19/23 1945   02/19/23 1645  cefTRIAXone (ROCEPHIN) 1 g in sodium chloride 0.9 % 100 mL IVPB        1 g 200 mL/hr over 30 Minutes Intravenous  Once 02/19/23 1631 02/19/23 1730  Subjective: Patient seen and examined at bedside today.She was lying on bed.Complains of some mild left abd discomfort,no nausea or vomiting,Urine has cleared  Objective: Vitals:   02/22/23 2033 02/23/23 0225 02/23/23 0608 02/23/23 0739  BP: (!) 155/58 (!) 123/36 (!) 153/56 (!) 131/58  Pulse: 69 65 65 65   Resp: 18 16 16 18   Temp: 98 F (36.7 C) 98.4 F (36.9 C) 97.7 F (36.5 C)   TempSrc:   Oral   SpO2: 97% 92% 97% 99%  Weight:  80.9 kg    Height:        Intake/Output Summary (Last 24 hours) at 02/23/2023 1313 Last data filed at 02/23/2023 0307 Gross per 24 hour  Intake 490 ml  Output 1400 ml  Net -910 ml   Filed Weights   02/19/23 1241 02/22/23 0500 02/23/23 0225  Weight: 76.2 kg 84.5 kg 80.9 kg    Examination:  General exam: Overall comfortable, not in distress,lying on bed, appears weak HEENT: PERRL Respiratory system:  no wheezes or crackles  Cardiovascular system: S1 & S2 heard, RRR.  Gastrointestinal system: Abdomen is mildly distended, soft and has mild left sided tendereness.BS present Central nervous system: Alert and oriented Extremities: No edema, no clubbing ,no cyanosis Skin: No rashes, no ulcers,no icterus     Data Reviewed: I have personally reviewed following labs and imaging studies  CBC: Recent Labs  Lab 02/19/23 1404 02/20/23 0006 02/20/23 0238 02/21/23 0542 02/23/23 0447  WBC 6.0  --  6.2 5.3 5.1  NEUTROABS 3.2  --   --   --  2.4  HGB 7.9* 7.6* 7.5* 7.3* 7.0*  HCT 24.7*  --  24.1* 22.4* 21.5*  MCV 95.7  --  95.6 94.9 97.3  PLT 202  --  186 184 192   Basic Metabolic Panel: Recent Labs  Lab 02/20/23 0006 02/20/23 0238 02/20/23 1248 02/21/23 0542 02/23/23 0447  NA 134* 133* 133* 133* 138  K 5.6* 5.5* 5.5* 4.6 4.2  CL 107 105 105 106 105  CO2 17* 14* 17* 18* 23  GLUCOSE 195* 212* 257* 190* 238*  BUN 45* 44* 46* 43* 35*  CREATININE 1.76* 1.79* 1.78* 1.91* 1.74*  CALCIUM 8.5* 8.5* 8.3* 8.5* 8.7*  MG 1.8  --   --  2.2 2.1  PHOS  --   --   --   --  4.5     Recent Results (from the past 240 hour(s))  Urine Culture     Status: Abnormal   Collection Time: 02/19/23 10:12 AM   Specimen: Urine, Clean Catch  Result Value Ref Range Status   Specimen Description   Final    URINE, CLEAN CATCH Performed at [facility], [address] [place], [place] [address]    Special Requests   Final    NONE Performed at [facility], [address], [place], [place] [address]    Culture MULTIPLE SPECIES PRESENT, SUGGEST RECOLLECTION (A)  Final   Report Status 02/20/2023 FINAL  Final     Radiology Studies: No results found.  Scheduled Meds:  acetaminophen  650 mg Oral Q8H   calcium-vitamin D  1 tablet Oral BID   Chlorhexidine Gluconate Cloth  6 each Topical Daily   dapagliflozin propanediol  5 mg Oral Daily   DULoxetine  20 mg Oral Daily   feeding supplement  237 mL Oral Daily   gabapentin  300 mg Oral TID   hydrocortisone  1 Application Rectal TID   insulin aspart  0-9  Units Subcutaneous TID WC   insulin aspart  2 Units Subcutaneous TID WC   insulin glargine-yfgn  15 Units Subcutaneous Daily   levETIRAcetam  750 mg Oral BID   pantoprazole  40 mg Oral Daily   rOPINIRole  1 mg Oral QHS   rosuvastatin  20 mg Oral QPM   sodium bicarbonate  650 mg Oral TID   Continuous Infusions:  piperacillin-tazobactam (ZOSYN)  IV 3.375 g (02/23/23 0604)     LOS: 4 days   Burnadette Pop, MD [facility] P6/25/2024, 1:13 PM

## 2023-02-24 DIAGNOSIS — N3001 Acute cystitis with hematuria: Secondary | ICD-10-CM | POA: Diagnosis not present

## 2023-02-24 LAB — BPAM RBC
Blood Product Expiration Date: 202407112359
ISSUE DATE / TIME: 202406251423
Unit Type and Rh: 7300

## 2023-02-24 LAB — BASIC METABOLIC PANEL
Anion gap: 11 (ref 5–15)
BUN: 35 mg/dL — ABNORMAL HIGH (ref 8–23)
CO2: 20 mmol/L — ABNORMAL LOW (ref 22–32)
Calcium: 8.4 mg/dL — ABNORMAL LOW (ref 8.9–10.3)
Chloride: 102 mmol/L (ref 98–111)
Creatinine, Ser: 1.53 mg/dL — ABNORMAL HIGH (ref 0.44–1.00)
GFR, Estimated: 33 mL/min — ABNORMAL LOW (ref >60)
Glucose, Bld: 226 mg/dL — ABNORMAL HIGH (ref 70–99)
Potassium: 4.4 mmol/L (ref 3.5–5.1)
Sodium: 133 mmol/L — ABNORMAL LOW (ref 135–145)

## 2023-02-24 LAB — TYPE AND SCREEN
ABO/RH(D): B POS
Unit division: 0

## 2023-02-24 LAB — GLUCOSE, CAPILLARY
Glucose-Capillary: 229 mg/dL — ABNORMAL HIGH (ref 70–99)
Glucose-Capillary: 169 mg/dL — ABNORMAL HIGH (ref 70–99)
Glucose-Capillary: 125 mg/dL — ABNORMAL HIGH (ref 70–99)
Glucose-Capillary: 124 mg/dL — ABNORMAL HIGH (ref 70–99)

## 2023-02-24 LAB — CBC
HCT: 26.3 % — ABNORMAL LOW (ref 36.0–46.0)
Hemoglobin: 8.4 g/dL — ABNORMAL LOW (ref 12.0–15.0)
MCH: 29.3 pg (ref 26.0–34.0)
MCHC: 31.9 g/dL (ref 30.0–36.0)
MCV: 91.6 fL (ref 80.0–100.0)
Platelets: 183 10*3/uL (ref 150–400)
RBC: 2.87 MIL/uL — ABNORMAL LOW (ref 3.87–5.11)
RDW: 17.4 % — ABNORMAL HIGH (ref 11.5–15.5)
WBC: 6.7 10*3/uL (ref 4.0–10.5)
nRBC: 0 % (ref 0.0–0.2)

## 2023-02-24 MED ORDER — SODIUM BICARBONATE 650 MG PO TABS: 650.0000 mg | ORAL_TABLET | Freq: Two times a day (BID) | ORAL | Status: AC

## 2023-02-24 MED ORDER — FERROUS SULFATE 325 (65 FE) MG PO TBEC: 325.0000 mg | DELAYED_RELEASE_TABLET | Freq: Every day | ORAL | 3 refills | Status: DC

## 2023-02-24 MED ORDER — AMOXICILLIN-POT CLAVULANATE 875-125 MG PO TABS: 1.0000 | ORAL_TABLET | Freq: Two times a day (BID) | ORAL | 0 refills | Status: AC

## 2023-02-24 MED ORDER — AMOXICILLIN-POT CLAVULANATE 500-125 MG PO TABS
1.0000 | ORAL_TABLET | Freq: Two times a day (BID) | ORAL | Status: DC
  Administered 2023-02-24: 1 via ORAL
  Filled 2023-02-24: qty 1

## 2023-02-24 NOTE — TOC Transition Note (Signed)
Transition of Care [facility]) - CM/SW Discharge Note   Patient Details  Name: [name] MRN: [id number] Date of Birth: Apr 19, 1936  Transition of Care [facility]) CM/SW Contact:  Emya Picado A [place], LCSWA Phone Number: 02/24/2023, 12:02 PM   Clinical Narrative:     Patient will DC to: [facility]  Anticipated DC date: 02/24/23  Family notified: Aura Fey  Transport by: Sharin Mons      Per MD patient ready for DC to [facility]. RN, patient, patient's family, and facility notified of DC. Discharge Summary and FL2 sent to facility. RN to call report prior to discharge [phone]). DC packet on chart. Ambulance transport requested for patient.     CSW will sign off for now as social work intervention is no longer needed. Please consult [place] again if new needs arise.   Final next level of care: Skilled Nursing Facility Barriers to Discharge: No Barriers Identified   Patient Goals and CMS Choice      Discharge Placement                Patient chooses bed at: [facility] Patient to be transferred to facility by: [facility] Name of family member notified: Aura Fey Patient and family notified of of transfer: 02/24/23  Discharge Plan and Services Additional resources added to the After Visit Summary for                                       Social Determinants of Health (SDOH) Interventions SDOH Screenings   Food Insecurity: No Food Insecurity (02/20/2023)  Housing: Low Risk  (02/20/2023)  Transportation Needs: No Transportation Needs (02/20/2023)  Utilities: Not At Risk (02/19/2023)  Depression (PHQ2-9): Low Risk  (12/31/2022)  Tobacco Use: Low Risk  (02/19/2023)     Readmission Risk Interventions    12/04/2020    1:17 PM  Readmission Risk Prevention Plan  Medication Screening Complete  Transportation Screening Complete

## 2023-02-24 NOTE — TOC Progression Note (Signed)
Transition of Care [facility]) - Progression Note    Patient Details  Name: [name] MRN: [id number] Date of Birth: 30-Sep-1935  Transition of Care [facility]) CM/SW Contact  Sadiq Mccauley A [place], [place] Phone Number: 02/24/2023, 11:30 AM  Clinical Narrative:     CSW contacted Keri at [facility] nursing to assist with discharge and find out if pt needs insurance authorization and can go today. CSW was able to reach [facility] and she said pt is there for long term care and would only need the DC summary. CSW will facilitate discharge.    TOC will continue to follow     Expected Discharge Plan and Services                                               Social Determinants of Health (SDOH) Interventions SDOH Screenings   Food Insecurity: No Food Insecurity (02/20/2023)  Housing: Low Risk  (02/20/2023)  Transportation Needs: No Transportation Needs (02/20/2023)  Utilities: Not At Risk (02/19/2023)  Depression (PHQ2-9): Low Risk  (12/31/2022)  Tobacco Use: Low Risk  (02/19/2023)    Readmission Risk Interventions    12/04/2020    1:17 PM  Readmission Risk Prevention Plan  Medication Screening Complete  Transportation Screening Complete

## 2023-02-24 NOTE — Inpatient Diabetes Management (Signed)
Inpatient Diabetes Program Recommendations  AACE/ADA: New Consensus Statement on Inpatient Glycemic Control (2015)  Target Ranges:  Prepandial:   less than 140 mg/dL      Peak postprandial:   less than 180 mg/dL (1-2 hours)      Critically ill patients:  140 - 180 mg/dL   Lab Results  Component Value Date   GLUCAP 229 (H) 02/24/2023   HGBA1C 7.5 (H) 02/18/2023    Review of Glycemic Control  Diabetes history: type 2 Outpatient Diabetes medications: Lantus 25 units at HS, Novolog 5 units TID meal coverage, farxiga 5 mg daily Current orders for Inpatient glycemic control: Semglee 15 units at HS, Novolog 0-9 units TID, HS scale, Novolog 2 units TID, Farxiga 5 mg daily  Inpatient Diabetes Program Recommendations:   Noted that blood sugars have been greater than 200 mg/dl.   Recommend increasing Novolog meal coverage to 4 units TID if blood sugars continue to be elevated.  Smith Mince RN BSN CDE Diabetes Coordinator Pager: [phone]  8am-5pm

## 2023-02-24 NOTE — Discharge Summary (Signed)
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  She has chronic Foley catheter for neurogenic bladder.  Patient was admitted for suspicion of UTI.She was started on Zosyn .  Urine cultures are multiple species.  Urology was also consulted.  Her hematuria has resolved.  Urology wants to follow-up as an outpatient for possible cystoscopy.  Medically stable for discharge back to facility today.  Following problems were addressed during the hospitalization:  Complicated UTI/gross hematuria: Has a chronic Foley catheter for neurogenic bladder.  Started  on Zosyn.  Currently afebrile, no leukocytosis.  Urine cultureshowed mixed organisms.  UA/urine culture repeated but no results yet.  CT imaging showed thickened bladder wall.  Foley exchanged here.   Urology was following here.  Recommended cystoscopy as an outpatient.   Early acute diverticulitis: CT abdomen/pelvis done her showed mild left-sided diverticulosis.  Currently tolerating soft diet.  Continue augmentin to complete a week of antibiotics course .has mild  left-sided abdominal discomfort   Hematuria/iron deficiency anemia: CT imaging denies any hydronephrosis or ureteral stone.  Her baseline hemoglobin is around 8.  This is most likely secondary to chronic disease. Given her  a unit of PRBC.  Iron studies showed low iron, given iv  iron.Continue oral iron supplementation on dc   CKD stage IIIb/IV/hyperkalemia: Baseline creatinine 1.6.  Currently kidney function at baseline.  On sodium bicarb tablets   Anxiety/depression: Continue home regimen   GERD: Continue PPI   Diabetes type 2: A1c of 7.5. Continue home regimen   Hypertension: Currently blood pressure stable.On amlodipine   Restless leg syndrome/central cord syndrome at C4: Resulting in neurogenic bladder.  She is paraplegic.  Needs total care.  Nonambulatory   Obesity: BMI 30.7  Discharge Diagnoses:  Principal Problem:   UTI (urinary tract infection)    Discharge Instructions  Discharge Instructions     Diet Carb Modified   Complete by: As directed    Discharge instructions   Complete by: As directed    1)Please take prescribed medications as instructed 2)Follow up with your PCP in a week. Do a CBC and BMP tests in a week 3)Follow up with urology as an outpatient.  Name and number of the provider group has been attached   Increase activity slowly   Complete by: As directed    No wound care   Complete by: As directed       Allergies as of 02/24/2023       Reactions   Ace Inhibitors Other (See Comments)   Hyperkalemia    Codeine    Unknown reaction   Sulfa Antibiotics    Unknown reaction        Medication List     TAKE these medications    acetaminophen 325 MG tablet Commonly known as: TYLENOL Take 650 mg by mouth every 8 (eight) hours.   AMBULATORY NON FORMULARY MEDICATION Medication Name:  Continue monthly catheter changes with 79fr catheter at skilled nursing facility.   amLODipine 5 MG tablet Commonly  known as: NORVASC Take 5 mg by mouth daily.   amoxicillin-clavulanate 875-125 MG tablet Commonly known as: AUGMENTIN Take 1 tablet by mouth every 12 (twelve) hours for 2 days. Start taking on: February 25, 2023   aspirin 81 MG chewable tablet Chew 81 mg by mouth daily.   augmented betamethasone dipropionate 0.05 %  ointment Commonly known as: DIPROLENE-AF Apply 1 Application topically 2 (two) times daily as needed. Apply to stomach and both breast   calcium-vitamin D 500-5 MG-MCG tablet Commonly known as: OSCAL WITH D Take 1 tablet by mouth 2 (two) times daily.   DULoxetine 20 MG capsule Commonly known as: CYMBALTA Take 20 mg by mouth daily.   Farxiga 5 MG Tabs tablet Generic drug: dapagliflozin propanediol Take 5 mg by mouth daily.   feeding supplement Liqd Take 237 mLs by mouth daily.   ferrous sulfate 325 (65 FE) MG EC tablet Take 1 tablet (325 mg total) by mouth daily with breakfast.   fexofenadine 60 MG tablet Commonly known as: ALLEGRA Take 60 mg by mouth daily.   gabapentin 300 MG capsule Commonly known as: NEURONTIN Take 300 mg by mouth 3 (three) times daily.   Insulin Pen Needle 30G X 5 MM Misc 1 Device by Does not apply route daily. 3/16"   ipratropium-albuterol 0.5-2.5 (3) MG/3ML Soln Commonly known as: DUONEB Take 3 mLs by nebulization every 6 (six) hours as needed (wheezing).   Lantus SoloStar 100 UNIT/ML Solostar Pen Generic drug: insulin glargine Inject 25 Units into the skin at bedtime.   levETIRAcetam 750 MG tablet Commonly known as: KEPPRA Take 750 mg by mouth 2 (two) times daily.   linaclotide 72 MCG capsule Commonly known as: Linzess Take 1 capsule (72 mcg total) by mouth daily before breakfast.   LORazepam 2 MG/ML injection Commonly known as: ATIVAN Inject 0.5 mLs (1 mg total) into the muscle every 15 (fifteen) minutes as needed.   melatonin 5 MG Tabs Take 5 mg by mouth at bedtime.   NON FORMULARY Diet - Regular   NovoLOG FlexPen 100 UNIT/ML FlexPen Generic drug: insulin aspart Inject 5 Units into the skin 3 (three) times daily with meals. 8 am, 12 pm, and 6 pm   omeprazole 40 MG capsule Commonly known as: PRILOSEC Take 1 capsule (40 mg total) by mouth in the morning and at bedtime.   Phenylephrine-Cocoa Butter 0.25-85.5 % Supp Place 1  suppository rectally 2 (two) times daily as needed.   polyethylene glycol 17 g packet Commonly known as: MIRALAX / GLYCOLAX Take 17 g by mouth daily.   rOPINIRole 1 MG tablet Commonly known as: REQUIP Take 1 mg by mouth at bedtime.   rosuvastatin 20 MG tablet Commonly known as: CRESTOR Take 20 mg by mouth every evening.   Sarna lotion Generic drug: camphor-menthol Apply 1 Application topically 2 (two) times daily as needed for itching.   sodium bicarbonate 650 MG tablet Take 1 tablet (650 mg total) by mouth 2 (two) times daily.   THEREMS M PO Take 1 tablet by mouth daily. (multivitamin with folic acid) tablet; 400 mcg;   tiZANidine 2 MG tablet Commonly known as: ZANAFLEX Take 2 mg by mouth every 6 (six) hours as needed for muscle spasms (for back and sciatic pain).   zinc oxide 20 % ointment Apply 1 Application topically See admin instructions. Apply every shift to bilateral buttocks, sacrum, and coccyx        Follow-up Information     Sharee Holster, NP. Schedule an  appointment as soon as possible for a visit in 1 week(s).   Specialty: Geriatric Medicine Contact information: [address] [place] [address] [phone]         [facility]. Schedule an appointment as soon as possible for a visit in 1 week(s).   Contact information: [address] [place] [address] [phone]        [facility] .   Contact information: [address] [place] [address]               Allergies  Allergen Reactions   Ace Inhibitors Other (See Comments)    Hyperkalemia    Codeine     Unknown reaction   Sulfa Antibiotics     Unknown reaction    Consultations: Urology   Procedures/Studies: CT ABDOMEN PELVIS WO CONTRAST  Result Date: 02/19/2023 CLINICAL DATA:  Abdomen pain flank pain EXAM: CT ABDOMEN AND PELVIS WITHOUT CONTRAST TECHNIQUE: Multidetector CT imaging of the  abdomen and pelvis was performed following the standard protocol without IV contrast. RADIATION DOSE REDUCTION: This exam was performed according to the departmental dose-optimization program which includes automated exposure control, adjustment of the mA and/or kV according to patient size and/or use of iterative reconstruction technique. COMPARISON:  CT 10/31/2022 FINDINGS: Lower chest: Lung bases are clear Hepatobiliary: No focal liver abnormality is seen. No gallstones, gallbladder wall thickening, or biliary dilatation. Pancreas: Unremarkable. No pancreatic ductal dilatation or surrounding inflammatory changes. Spleen: Normal in size without focal abnormality. Adrenals/Urinary Tract: Adrenal glands are normal. Mildly atrophic left kidney. No hydronephrosis. Decompressed urinary bladder with Foley catheter. Bladder appears thick walled with perivesical stranding Stomach/Bowel: Stomach nonenlarged. No dilated small bowel. Diverticular disease of left colon. Minimal fat stranding adjacent to the descending colon, for example series 3, image 54 and coronal series 6, image 63 suggestive of mild diverticulitis. Vascular/Lymphatic: Moderate aortic atherosclerosis. No aneurysm. No suspicious lymph nodes. Reproductive: Status post hysterectomy. No adnexal masses. Other: Negative for pelvic effusion or free air Musculoskeletal: No acute osseous abnormality IMPRESSION: 1. Diverticular disease of left colon with minimal fat stranding adjacent to the descending colon suggestive of mild diverticulitis. 2. No hydronephrosis or ureteral stone. Decompressed urinary bladder with Foley catheter. Bladder wall appears thickened with perivesical stranding, correlate with urinalysis for cystitis. 3. Mildly atrophic left kidney 4. Aortic atherosclerosis. Aortic Atherosclerosis (ICD10-I70.0). Electronically Signed   By: Jasmine Pang M.D.   On: 02/19/2023 20:48      Subjective: Patient seen and examined the bedside today.   Hemodynamically stable for discharge.  I called her granddaughter and discussed about discharge planning.  Discharge Exam: Vitals:   02/24/23 0629 02/24/23 0740  BP: (!) 146/45 (!) 140/45  Pulse: 65 71  Resp: 17 18  Temp: 97.9 F (36.6 C)   SpO2: 94% 95%   Vitals:   02/24/23 0059 02/24/23 0500 02/24/23 0629 02/24/23 0740  BP: (!) 157/55  (!) 146/45 (!) 140/45  Pulse: 70  65 71  Resp: 18  17 18   Temp: 97.8 F (36.6 C)  97.9 F (36.6 C)   TempSrc: Oral     SpO2: 96%  94% 95%  Weight:  80.4 kg    Height:        General: Pt is alert, awake, not in acute distress Cardiovascular: RRR, S1/S2 +, no rubs, no gallops Respiratory: CTA bilaterally, no wheezing, no rhonchi Abdominal: Soft, NT, ND, bowel sounds + Extremities: no edema, no cyanosis,paraplegia GU: Foley  The results of significant diagnostics from this hospitalization (including imaging, microbiology, ancillary and laboratory) are listed below for reference.     Microbiology: Recent Results (from the past 240 hour(s))  Urine Culture     Status: Abnormal   Collection Time: 02/19/23 10:12 AM   Specimen: Urine, Clean Catch  Result Value Ref Range Status   Specimen Description   Final    URINE, CLEAN CATCH Performed at [facility], [address], [place], [place] [address]    Special Requests   Final    NONE Performed at [facility], [address] [place], [place] [address]    Culture MULTIPLE SPECIES PRESENT, SUGGEST RECOLLECTION (A)  Final   Report Status 02/20/2023 FINAL  Final     Labs: BNP (last 3 results) No results for input(s): "BNP" in the last 8760 hours. Basic Metabolic Panel: Recent Labs  Lab 02/20/23 0006 02/20/23 0238 02/20/23 1248 02/21/23 0542 02/23/23 0447 02/24/23 0450  NA 134* 133* 133* 133* 138 133*  K 5.6* 5.5* 5.5* 4.6 4.2 4.4  CL 107 105 105 106 105 102  CO2 17* 14* 17* 18* 23 20*  GLUCOSE 195* 212* 257* 190* 238* 226*  BUN 45* 44* 46* 43* 35* 35*  CREATININE 1.76*  1.79* 1.78* 1.91* 1.74* 1.53*  CALCIUM 8.5* 8.5* 8.3* 8.5* 8.7* 8.4*  MG 1.8  --   --  2.2 2.1  --   PHOS  --   --   --   --  4.5  --    Liver Function Tests: Recent Labs  Lab 02/20/23 0238 02/23/23 0447  AST 15  --   ALT 15  --   ALKPHOS 54  --   BILITOT 0.3  --   PROT 6.2*  --   ALBUMIN 3.1* 2.9*   No results for input(s): "LIPASE", "AMYLASE" in the last 168 hours. No results for input(s): "AMMONIA" in the last 168 hours. CBC: Recent Labs  Lab 02/19/23 1404 02/20/23 0006 02/20/23 0238 02/21/23 0542 02/23/23 0447 02/24/23 0450  WBC 6.0  --  6.2 5.3 5.1 6.7  NEUTROABS 3.2  --   --   --  2.4  --   HGB 7.9* 7.6* 7.5* 7.3* 7.0* 8.4*  HCT 24.7*  --  24.1* 22.4* 21.5* 26.3*  MCV 95.7  --  95.6 94.9 97.3 91.6  PLT 202  --  186 184 192 183   Cardiac Enzymes: Recent Labs  Lab 02/21/23 1126  CKTOTAL 46   BNP: Invalid input(s): "POCBNP" CBG: Recent Labs  Lab 02/23/23 0825 02/23/23 1156 02/23/23 1614 02/23/23 2057 02/24/23 0833  GLUCAP 210* 247*  247* 251* 231* 229*   D-Dimer No results for input(s): "DDIMER" in the last 72 hours. Hgb A1c No results for input(s): "HGBA1C" in the last 72 hours. Lipid Profile No results for input(s): "CHOL", "HDL", "LDLCALC", "TRIG", "CHOLHDL", "LDLDIRECT" in the last 72 hours. Thyroid function studies No results for input(s): "TSH", "T4TOTAL", "T3FREE", "THYROIDAB" in the last 72 hours.  Invalid input(s): "FREET3" Anemia work up No results for input(s): "VITAMINB12", "FOLATE", "FERRITIN", "TIBC", "IRON", "RETICCTPCT" in the last 72 hours. Urinalysis    Component Value Date/Time   COLORURINE YELLOW 02/21/2023 1300   APPEARANCEUR HAZY (A) 02/21/2023 1300   LABSPEC 1.007 02/21/2023 1300   PHURINE 5.0 02/21/2023 1300   GLUCOSEU >=500 (A) 02/21/2023 1300   HGBUR LARGE (A) 02/21/2023 1300   BILIRUBINUR NEGATIVE 02/21/2023 1300   KETONESUR NEGATIVE 02/21/2023 1300   PROTEINUR 30 (A) 02/21/2023 1300  NITRITE NEGATIVE  02/21/2023 1300   LEUKOCYTESUR NEGATIVE 02/21/2023 1300   Sepsis Labs Recent Labs  Lab 02/20/23 0238 02/21/23 0542 02/23/23 0447 02/24/23 0450  WBC 6.2 5.3 5.1 6.7   Microbiology Recent Results (from the past 240 hour(s))  Urine Culture     Status: Abnormal   Collection Time: 02/19/23 10:12 AM   Specimen: Urine, Clean Catch  Result Value Ref Range Status   Specimen Description   Final    URINE, CLEAN CATCH Performed at [facility], [address], [place], [place] [address]    Special Requests   Final    NONE Performed at [facility], [address] [place], [place] [address]    Culture MULTIPLE SPECIES PRESENT, SUGGEST RECOLLECTION (A)  Final   Report Status 02/20/2023 FINAL  Final    Please note: You were cared for by a hospitalist during your hospital stay. Once you are discharged, your primary care physician will handle any further medical issues. Please note that NO REFILLS for any discharge medications will be authorized once you are discharged, as it is imperative that you return to your primary care physician (or establish a relationship with a primary care physician if you do not have one) for your post hospital discharge needs so that they can reassess your need for medications and monitor your lab values.    Time coordinating discharge: 40 minutes  SIGNED:   Burnadette Pop, MD  [facility] 02/24/2023, 11:52 AM Pager [phone]  If 7PM-7AM, please contact night-coverage [email or web address] Password [email or web address]

## 2023-02-25 ENCOUNTER — Non-Acute Institutional Stay (SKILLED_NURSING_FACILITY): Payer: Medicare HMO | Admitting: Internal Medicine

## 2023-02-25 ENCOUNTER — Encounter: Payer: Self-pay | Admitting: Internal Medicine

## 2023-02-25 DIAGNOSIS — R1012 Left upper quadrant pain: Secondary | ICD-10-CM | POA: Diagnosis not present

## 2023-02-25 DIAGNOSIS — E1122 Type 2 diabetes mellitus with diabetic chronic kidney disease: Secondary | ICD-10-CM | POA: Diagnosis not present

## 2023-02-25 DIAGNOSIS — R310 Gross hematuria: Secondary | ICD-10-CM

## 2023-02-25 DIAGNOSIS — N183 Chronic kidney disease, stage 3 unspecified: Secondary | ICD-10-CM | POA: Diagnosis not present

## 2023-02-25 NOTE — Assessment & Plan Note (Signed)
Hematuria continues to be resolved at this time.  Foley catheter had to be replaced 6/27 due to decreased urine output.

## 2023-02-25 NOTE — Assessment & Plan Note (Signed)
While hospitalized 6/21 - 02/24/2023 with gross hematuria creatinine peaked at 1.91 with a GFR of 26.  Final values were 1.53 with a GFR 33 indicating low stage IIIb CKD.  Med list reviewed; no indication for change in meds or dosages at this time unless there is progression of CKD. A1c was 7.5% indicating adequate control in the context of advanced age with advanced comorbidities.  Glucoses ranged from a low of 124 up to a high of 250; both were outliers.

## 2023-02-25 NOTE — Assessment & Plan Note (Signed)
At the time of admission 6/21 she was complaining of suprapubic pain.  Post discharge she describes some aching left upper quadrant or left flank/back pain.  She has difficulty localizing the pain in the context of her neurologic issues. Complete full course of Augmentin.

## 2023-02-25 NOTE — Patient Instructions (Signed)
See assessment and plan under each diagnosis in the problem list and acutely for this visit 

## 2023-02-25 NOTE — Progress Notes (Signed)
NURSING HOME LOCATION:  [facility] ROOM NUMBER:  [phone]  CODE STATUS: DNR   PCP:  Synthia Innocent NP  This is a nursing facility follow up visit for Nursing Facility readmission within 30 days.  Interim medical record and care since last SNF visit was updated with review of diagnostic studies and change in clinical status since last visit were documented.  HPI: She was rehospitalized 6/21 - 02/24/2023; sent from the SNF because of gross hematuria and lower abdominal pain in the context of severe chronic anemia and chronic Foley placement for neurogenic bladder.  Empirically she was started on Zosyn; urine culture revealed mixed organisms.  CT imaging revealed thickened bladder wall and mild left-sided diverticulosis.  Foley was replaced.  Urology was consulted; her hematuria subsequently resolved.  Urology did recommend follow-up as an outpatient for possible cystoscopy. Zosyn was transitioned to Augmentin.  The patient continued to have left-sided abdominal discomfort. While hospitalized H/H varied from a low of 7/21.5 up to the final values of 8.4/26.3.  She did receive a unit of packed red cells.  Iron panel confirmed iron deficiency; she received IV iron as well.  Oral iron supplementation was to be continued at discharge. Creatinine peaked at 1.91 with a GFR of 26; final values were 1.53 with a GFR 33 indicating low stage IIIb CKD.  A1c was 7.5%; glucoses ranged from a low of 124 up to a high of 250.  Both of these were outliers.  Hyponatremia was present with values from a low of 130 up to 138.  At discharge sodium was 133. As of 6/26 she was felt stable enough to return to the SNF where she is a permanent resident.  Review of systems: She understands that she had "an infection in my colon."  She actually named this as "diverticulitis."  She continues to have some left upper quadrant or back/flank pain which she describes as achy.  She denies any other active GI or GU  symptoms.  GI follow-up has been scheduled.  Urology appointment will be made by her granddaughter.  Constitutional: No fever, significant weight change, fatigue  Eyes: No redness, discharge, pain, vision change ENT/mouth: No nasal congestion,  purulent discharge, earache, change in hearing, sore throat  Cardiovascular: No chest pain, palpitations, paroxysmal nocturnal dyspnea,edema  Respiratory: No cough, sputum production, hemoptysis, DOE, significant snoring, apnea   Gastrointestinal: No heartburn, dysphagia, nausea /vomiting, rectal bleeding, melena, change in bowels Genitourinary: No dysuria, hematuria, pyuria, incontinence, nocturia Musculoskeletal: No joint stiffness, joint swelling Dermatologic: No rash, pruritus, change in appearance of skin Neurologic: No dizziness, headache, syncope, seizures Psychiatric: No significant anxiety, depression, insomnia, anorexia Endocrine: No change in hair/skin/nails, excessive thirst, excessive hunger, excessive urination  Hematologic/lymphatic: No significant bruising, lymphadenopathy, abnormal bleeding Allergy/immunology: No itchy/watery eyes, significant sneezing, urticaria, angioedema  Physical exam:  Pertinent or positive findings: Bilateral ptosis is present.  She is wearing only the upper plate.  Second heart sound is increased.  Bronchovesicular breath sounds are present superiorly.  Abdomen is markedly distended; but bowel sounds are active and there is no tenderness to palpation.  Dorsalis pedis pulses are stronger than posterior tibial pulses.  She has trace edema at the ankles.  There is essentially no range of motion of the lower extremities.  General appearance: Adequately nourished; no acute distress, increased work of breathing is present.   Lymphatic: No lymphadenopathy about the head, neck, axilla. Eyes: No conjunctival inflammation or lid edema is present. There is no scleral icterus.  Ears:  External ear exam shows no significant  lesions or deformities.   Nose:  External nasal examination shows no deformity or inflammation. Nasal mucosa are pink and moist without lesions, exudates Oral exam:  There is no oropharyngeal erythema or exudate. Neck:  No thyromegaly, masses, tenderness noted.    Heart:  Normal rate and regular rhythm. S1  normal without gallop, murmur, click, rub .  Lungs:  without wheezes, rhonchi, rales, rubs. Abdomen:  no organomegaly, hernias, masses. GU: Deferred  Extremities:  No cyanosis, clubbing Neurologic exam :Balance, Rhomberg, finger to nose testing could not be completed due to clinical state Skin: Warm & dry w/o tenting. No significant lesions or rash.  See summary under each active problem in the Problem List with associated updated therapeutic plan

## 2023-02-26 ENCOUNTER — Ambulatory Visit (HOSPITAL_COMMUNITY)
Admission: RE | Admit: 2023-02-26 | Discharge: 2023-02-26 | Disposition: A | Discharge status: Home / Self Care | Payer: Medicare HMO | Source: Ambulatory Visit | Attending: Internal Medicine | Admitting: Internal Medicine

## 2023-02-26 ENCOUNTER — Encounter: Payer: Self-pay | Admitting: Internal Medicine

## 2023-02-26 ENCOUNTER — Encounter (HOSPITAL_COMMUNITY)
Admission: RE | Admit: 2023-02-26 | Discharge: 2023-02-26 | Disposition: A | Discharge status: Home / Self Care | Payer: Medicare HMO | Source: Skilled Nursing Facility | Attending: Internal Medicine | Admitting: Internal Medicine

## 2023-02-26 ENCOUNTER — Non-Acute Institutional Stay (SKILLED_NURSING_FACILITY): Payer: Medicare HMO | Admitting: Internal Medicine

## 2023-02-26 ENCOUNTER — Other Ambulatory Visit (HOSPITAL_COMMUNITY): Payer: Self-pay | Admitting: Internal Medicine

## 2023-02-26 DIAGNOSIS — L03113 Cellulitis of right upper limb: Secondary | ICD-10-CM | POA: Diagnosis not present

## 2023-02-26 DIAGNOSIS — N189 Chronic kidney disease, unspecified: Secondary | ICD-10-CM | POA: Diagnosis not present

## 2023-02-26 DIAGNOSIS — I808 Phlebitis and thrombophlebitis of other sites: Secondary | ICD-10-CM | POA: Diagnosis not present

## 2023-02-26 DIAGNOSIS — R600 Localized edema: Secondary | ICD-10-CM | POA: Diagnosis not present

## 2023-02-26 DIAGNOSIS — Z862 Personal history of diseases of the blood and blood-forming organs and certain disorders involving the immune mechanism: Secondary | ICD-10-CM

## 2023-02-26 DIAGNOSIS — I809 Phlebitis and thrombophlebitis of unspecified site: Secondary | ICD-10-CM | POA: Insufficient documentation

## 2023-02-26 DIAGNOSIS — I82721 Chronic embolism and thrombosis of deep veins of right upper extremity: Secondary | ICD-10-CM

## 2023-02-26 DIAGNOSIS — I82611 Acute embolism and thrombosis of superficial veins of right upper extremity: Secondary | ICD-10-CM | POA: Diagnosis not present

## 2023-02-26 DIAGNOSIS — L0390 Cellulitis, unspecified: Secondary | ICD-10-CM | POA: Diagnosis not present

## 2023-02-26 LAB — CBC WITH DIFFERENTIAL/PLATELET
Abs Immature Granulocytes: 0.04 10*3/uL (ref 0.00–0.07)
Basophils Absolute: 0 10*3/uL (ref 0.0–0.1)
Basophils Relative: 0 %
Eosinophils Absolute: 0.3 10*3/uL (ref 0.0–0.5)
Eosinophils Relative: 5 %
HCT: 28 % — ABNORMAL LOW (ref 36.0–46.0)
Hemoglobin: 9 g/dL — ABNORMAL LOW (ref 12.0–15.0)
Immature Granulocytes: 1 %
Lymphocytes Relative: 24 %
Lymphs Abs: 1.7 10*3/uL (ref 0.7–4.0)
MCH: 29.8 pg (ref 26.0–34.0)
MCHC: 32.1 g/dL (ref 30.0–36.0)
MCV: 92.7 fL (ref 80.0–100.0)
Monocytes Absolute: 0.5 10*3/uL (ref 0.1–1.0)
Monocytes Relative: 6 %
Neutro Abs: 4.6 10*3/uL (ref 1.7–7.7)
Neutrophils Relative %: 64 %
Platelets: 173 10*3/uL (ref 150–400)
RBC: 3.02 MIL/uL — ABNORMAL LOW (ref 3.87–5.11)
RDW: 16.3 % — ABNORMAL HIGH (ref 11.5–15.5)
WBC: 7.2 10*3/uL (ref 4.0–10.5)
nRBC: 0 % (ref 0.0–0.2)

## 2023-02-26 LAB — D-DIMER, QUANTITATIVE: D-Dimer, Quant: 1.36 ug/mL-FEU — ABNORMAL HIGH (ref 0.00–0.50)

## 2023-02-26 NOTE — Assessment & Plan Note (Addendum)
Clinically there is superficial thrombophlebitis above an area of cellulitis in the right antecubital area.  Venous Doppler has been ordered; but the outpatient imaging service this facility uses states they cannot do the study until Monday 7/1.  If this cannot be scheduled this afternoon at [facility]; she will be sent to the ER to have this performed. Eliquis will be initiated with close monitor for bleeding dyscrasias will be critical because of her recent gross hematuria and her significant anemia.

## 2023-02-26 NOTE — Patient Instructions (Signed)
See assessment and plan under each diagnosis in the problem list and acutely for this visit 

## 2023-02-26 NOTE — Assessment & Plan Note (Signed)
CBC will be checked prior to initiation of Eliquis.  I am very concerned about starting Eliquis with such significant anemia and recent hematuria; but clinically she has a rapidly progressive clot which could lead to deep venous involvement and potential PTE.

## 2023-02-26 NOTE — Progress Notes (Signed)
   NURSING HOME LOCATION:  [facility] ROOM NUMBER:  [phone]  CODE STATUS: DNR   PCP:  Synthia Innocent NP  This is a nursing facility follow up visit for specific acute issue of redness & swelling RUE.  Interim medical record and care since last SNF visit was updated with review of diagnostic studies and change in clinical status since last visit were documented.  HPI: Overnight a red tender area has appeared in the right antecubital area.  There is been no injury.  She states all IVs were in the left upper extremity while she was hospitalized.  As of tomorrow she would have completed the full course of Augmentin for the possible diverticulitis. She states this area is tender to touch.  She denies any upper or lower respiratory tract infection symptoms.  She denies any active bleeding dyscrasias.  Hematuria has not recurred.  Abdominal pain is better.  Review of systems:Constitutional: No fever, significant weight change, fatigue  Eyes: No redness, discharge, pain, vision change ENT/mouth: No nasal congestion,  purulent discharge, earache, change in hearing, sore throat  Cardiovascular: No chest pain, palpitations, paroxysmal nocturnal dyspnea Respiratory: No cough, sputum production, hemoptysis, DOE Gastrointestinal: No heartburn, dysphagia,nausea /vomiting, rectal bleeding, melena, change in bowels Genitourinary: No dysuria, hematuria, pyuria Musculoskeletal: No joint stiffness, joint swelling, weakness, pain Dermatologic: No rash, pruritus Endocrine: No change in hair/nails, excessive thirst, excessive hunger, excessive urination  Hematologic/lymphatic: No significant bruising, lymphadenopathy, abnormal bleeding Allergy/immunology: No itchy/watery eyes, significant sneezing, urticaria, angioedema  Physical exam:  Pertinent or positive findings: The second heart sound is accentuated.  Abdomen is protuberant.  Foley catheter in place.  There is a 2.5 x 2 inch erythema in  the right antecubital area with subcutaneous induration and tenderness to palpation.  Superior to this is some vertical erythema with what clinically appears to be superficial venous clotting.  She also has new ecchymotic lesions over the left deltoid area some with isolated blister type formation.  No epitrochlear, axillary, cervical lymphadenopathy is present.  General appearance: Adequately nourished; no acute distress, increased work of breathing is present.   Eyes: No conjunctival inflammation or lid edema is present. There is no scleral icterus. Ears:  External ear exam shows no significant lesions or deformities.   Nose:  External nasal examination shows no deformity or inflammation. Nasal mucosa are pink and moist without lesions, exudates Neck:  No thyromegaly, masses, tenderness noted.    Heart:  Normal rate and regular rhythm. S1 normal without gallop, murmur, click, rub .  Lungs: Chest clear to auscultation without wheezes, rhonchi, rales, rubs. Abdomen: Bowel sounds are normal. Abdomen is soft and nontender with no organomegaly, hernias, masses. GU: Deferred  Extremities:  No cyanosis, clubbing, edema  Skin: Warm & dry w/o tenting. No significant rash.  See summary under each active problem in the Problem List with associated updated therapeutic plan

## 2023-02-26 NOTE — Assessment & Plan Note (Signed)
The Augmentin will be continued until tomorrow.  Doxycycline 100 mg twice daily will be initiated.

## 2023-03-01 DIAGNOSIS — E1169 Type 2 diabetes mellitus with other specified complication: Secondary | ICD-10-CM | POA: Insufficient documentation

## 2023-03-01 DIAGNOSIS — E1122 Type 2 diabetes mellitus with diabetic chronic kidney disease: Secondary | ICD-10-CM | POA: Insufficient documentation

## 2023-03-01 DIAGNOSIS — N319 Neuromuscular dysfunction of bladder, unspecified: Secondary | ICD-10-CM | POA: Insufficient documentation

## 2023-03-02 ENCOUNTER — Ambulatory Visit (INDEPENDENT_AMBULATORY_CARE_PROVIDER_SITE_OTHER): Payer: Medicare HMO | Admitting: Gastroenterology

## 2023-03-02 ENCOUNTER — Encounter: Payer: Self-pay | Admitting: Gastroenterology

## 2023-03-02 VITALS — BP 124/71 | HR 64 | Temp 97.7°F | Wt 177.0 lb

## 2023-03-02 DIAGNOSIS — K219 Gastro-esophageal reflux disease without esophagitis: Secondary | ICD-10-CM

## 2023-03-02 DIAGNOSIS — D649 Anemia, unspecified: Secondary | ICD-10-CM | POA: Diagnosis not present

## 2023-03-02 DIAGNOSIS — K5900 Constipation, unspecified: Secondary | ICD-10-CM | POA: Diagnosis not present

## 2023-03-02 DIAGNOSIS — R1310 Dysphagia, unspecified: Secondary | ICD-10-CM

## 2023-03-02 DIAGNOSIS — K5732 Diverticulitis of large intestine without perforation or abscess without bleeding: Secondary | ICD-10-CM | POA: Diagnosis not present

## 2023-03-02 DIAGNOSIS — R630 Anorexia: Secondary | ICD-10-CM

## 2023-03-02 DIAGNOSIS — R109 Unspecified abdominal pain: Secondary | ICD-10-CM | POA: Diagnosis not present

## 2023-03-02 NOTE — Progress Notes (Addendum)
GI Office Note    Referring Provider: Sharee Holster, NP Primary Care Physician:  Sharee Holster, NP Primary Gastroenterologist: Hennie Duos. Marletta Lor, DO  Date:  03/02/2023  ID:  [name], DOB 08-05-1936, MRN [id number]   Chief Complaint   Chief Complaint  Patient presents with   Follow-up    Still has no appetite.    History of Present Illness  [name] is a 87 y.o. female with a history of anemia, diabetes, HTN, HLD, central cord syndrome at C4 secondary to fall in May 2022 presenting today for follow up of a good appetite, constipation, and abdominal pain.     EGD 09/22/21: -gastritis s/p biopsy -normal duodenum -omeprazole 40 mg daily -Reglan 5 mg QID   Two ED visits recently for constipation and abdominal pain earlier this month. CT as outlined below. Treated with soap suds enema one instance. No changes to bowel regimen made, continue metamucil.   CT A/P 10/01/22: -mild fullness in right renal collecting system -changes consistent with rectal impaction   LFTs normal. Hgb 9.6, Glucose 404 on 10/01/22.    Office visit 10/20/22. Seen for rectal pain and abdominal pain.  Abdominal pain reported as being sharp.  Daughter reported issues since she had a fall.  Starts in the left and radiates to her left upper abdomen.  Has some nausea but no vomiting.  Reports some esophageal burning and mild dysphagia due to issues swallowing.  Patient was unsure of her frequency of bowel movements.  States poor appetite due to pain in all the likely change in food choices at SNF.  Also reported bloating and belching.  Having intermittent rectal pain.  History of spinal cord injury since fall.  Added Linzess 72 mcg daily, continue MiraLAX daily.  Advised suppository once weekly given CT evidence of infection.  Scheduled for EGD with dilation per patient's request to evaluate her swallowing and upper abdominal pain.  Omeprazole increased to 40 mg twice daily from 20 mg twice daily.   EGD  11/19/22: -Gastritis s/p biopsy -Normal duodenum -No esophageal stenosis or stricture -Biopsies negative for H. Pylori, hyperplasia seen consistent with PPI therapy.  -Advised PPI twice daily  Last office visit 12/30/22.  Still with complaints of constipation, lack of appetite, mild dysphagia, and left-sided abdominal pain.  Advised to obtain KUB to assess for constipation, continue hemorrhoid cream as needed, continue PPI twice daily.  Discussed possibly increasing Linzess depending on KUB and start MiraLAX twice daily.  Advised to offer Ensure 1-2 times per day and monitor weight frequently.  KUB 12/30/22: Nonobstructive bowel gas pattern, scattered stool in the colon.  Received notification from the primary NP after her last office visit he reported that her appetite has been okay and her weight has been stable since January and that she was having a daily bowel movement while being on MiraLAX once a day and Linzess once daily and she requested that we keep her on MiraLAX once daily instead of increasing to twice daily.  She also discussed discontinuing ensures given her appetite appeared to be good.  Recent hospitalization 6/21 for complicated UTI for which she had CT imaging that showed diverticular disease of the left colon with minimal fat stranding adjacent to the descending colon suggestive of mild diverticulitis and mild bladder wall thickening concerning for possible cystitis.  She was treated with Augmentin for her diverticulitis and was initially treated with Zosyn.  Foley exchanged and she was recommended to have cystoscopy outpatient.  Baseline hemoglobin noted to be 8 and she was given 1 unit PRBC during this hospital admission and her iron studies showed low iron which was also replaced while inpatient and was discharged on oral iron supplementation.  Today: Appetite, GERD - Does not like the taste of foods at the facility given they are ot seasoned and cooked to her liking. Denies N/V.  Does have some mild dysphagia with pills. No heartburn. Still with poor apetite per patient. Weight stable.  Patient and daughter report the food is very bland at facility and given that it is not seasoned like she is used to she has no interest in eating very much.  Her daughter also states that they give her most potatoes on a regular basis and these are usually instant potatoes.  Patient feels like if she can eat more vegetables then she would feel better overall or at least have some sort of vitamin supplement.  No melena, hematemesis, brbpr.   Constipation, left side pain- Still having intermittent left-sided pain.  Has been having regular Bms.  Patient does report a little intermittent diarrhea since being on antibiotics.   Wt Readings from Last 3 Encounters:  03/02/23 177 lb (80.3 kg)  02/24/23 177 lb 4 oz (80.4 kg)  02/17/23 177 lb 3.2 oz (80.4 kg)    Current Outpatient Medications  Medication Sig Dispense Refill   acetaminophen (TYLENOL) 325 MG tablet Take 650 mg by mouth every 8 (eight) hours.     AMBULATORY NON FORMULARY MEDICATION Medication Name:  Continue monthly catheter changes with 31fr catheter at skilled nursing facility. 1 application MONTHLY   amLODipine (NORVASC) 5 MG tablet Take 5 mg by mouth daily.     aspirin 81 MG chewable tablet Chew 81 mg by mouth daily.     augmented betamethasone dipropionate (DIPROLENE-AF) 0.05 % ointment Apply 1 Application topically 2 (two) times daily as needed. Apply to stomach and both breast     calcium-vitamin D (OSCAL WITH D) 500-5 MG-MCG tablet Take 1 tablet by mouth 2 (two) times daily.     camphor-menthol (SARNA) lotion Apply 1 Application topically 2 (two) times daily as needed for itching.     dapagliflozin propanediol (FARXIGA) 5 MG TABS tablet Take 5 mg by mouth daily.     DULoxetine (CYMBALTA) 20 MG capsule Take 20 mg by mouth daily.     feeding supplement (ENSURE ENLIVE / ENSURE PLUS) LIQD Take 237 mLs by mouth daily.      ferrous sulfate 325 (65 FE) MG EC tablet Take 1 tablet (325 mg total) by mouth daily with breakfast. 60 tablet 3   fexofenadine (ALLEGRA) 60 MG tablet Take 60 mg by mouth daily.     gabapentin (NEURONTIN) 300 MG capsule Take 300 mg by mouth 3 (three) times daily.     insulin aspart (NOVOLOG FLEXPEN) 100 UNIT/ML FlexPen Inject 5 Units into the skin 3 (three) times daily with meals. 8 am, 12 pm, and 6 pm     Insulin Pen Needle 30G X 5 MM MISC 1 Device by Does not apply route daily. 3/16"  0   ipratropium-albuterol (DUONEB) 0.5-2.5 (3) MG/3ML SOLN Take 3 mLs by nebulization every 6 (six) hours as needed (wheezing).     LANTUS SOLOSTAR 100 UNIT/ML Solostar Pen Inject 25 Units into the skin at bedtime.     levETIRAcetam (KEPPRA) 750 MG tablet Take 750 mg by mouth 2 (two) times daily.     linaclotide (LINZESS) 72 MCG capsule Take 1 capsule (  72 mcg total) by mouth daily before breakfast. 30 capsule 2   LORazepam (ATIVAN) 2 MG/ML injection Inject 0.5 mLs (1 mg total) into the muscle every 15 (fifteen) minutes as needed. 4 mL 0   melatonin 5 MG TABS Take 5 mg by mouth at bedtime.     Multiple Vitamins-Minerals (THEREMS M PO) Take 1 tablet by mouth daily. (multivitamin with folic acid) tablet; 400 mcg;     NON FORMULARY Diet - Regular     omeprazole (PRILOSEC) 40 MG capsule Take 1 capsule (40 mg total) by mouth in the morning and at bedtime. 90 capsule 3   Phenylephrine-Cocoa Butter 0.25-85.5 % SUPP Place 1 suppository rectally 2 (two) times daily as needed.     polyethylene glycol (MIRALAX / GLYCOLAX) 17 g packet Take 17 g by mouth daily.     rOPINIRole (REQUIP) 1 MG tablet Take 1 mg by mouth at bedtime.     rosuvastatin (CRESTOR) 20 MG tablet Take 20 mg by mouth every evening.     sodium bicarbonate 650 MG tablet Take 1 tablet (650 mg total) by mouth 2 (two) times daily.     tiZANidine (ZANAFLEX) 2 MG tablet Take 2 mg by mouth every 6 (six) hours as needed for muscle spasms (for back and sciatic pain).      zinc oxide 20 % ointment Apply 1 Application topically See admin instructions. Apply every shift to bilateral buttocks, sacrum, and coccyx     No current facility-administered medications for this visit.    Past Medical History:  Diagnosis Date   Adult failure to thrive    Anemia    Anxiety    Atherosclerosis of aorta (HCC)    Central cord syndrome at C4 level of cervical spinal cord, subsequent encounter (HCC)    CKD (chronic kidney disease)    stage 3   Depression    DM type 2 with diabetic peripheral neuropathy (HCC)    Dysphagia    GERD (gastroesophageal reflux disease)    Gout    High cholesterol    HTN (hypertension)    Hyponatremia    MDD (major depressive disorder)    Neck pain    Neuropathy    Quadriplegia, C1-C4 incomplete (HCC)    Radiculopathy    RLS (restless legs syndrome)    Urinary retention     Past Surgical History:  Procedure Laterality Date   ABDOMINAL HYSTERECTOMY     ANTERIOR CERVICAL DECOMP/DISCECTOMY FUSION N/A 02/05/2021   Procedure: Cervical Three-Four  Anterior cervical decompression/discectomy/fusion;  Surgeon: Coletta Memos, MD;  Location: [facility] OR;  Service: Neurosurgery;  Laterality: N/A;  RM 20   APPENDECTOMY     BACK SURGERY     BALLOON DILATION N/A 11/19/2022   Procedure: BALLOON DILATION;  Surgeon: Lanelle Bal, DO;  Location: [facility];  Service: Endoscopy;  Laterality: N/A;   BIOPSY  09/22/2021   Procedure: BIOPSY;  Surgeon: Lanelle Bal, DO;  Location: [facility];  Service: Endoscopy;;   BIOPSY  11/19/2022   Procedure: BIOPSY;  Surgeon: Lanelle Bal, DO;  Location: [facility];  Service: Endoscopy;;   CERVICAL DISC SURGERY     ESOPHAGOGASTRODUODENOSCOPY (EGD) WITH PROPOFOL N/A 09/22/2021   Procedure: ESOPHAGOGASTRODUODENOSCOPY (EGD) WITH PROPOFOL;  Surgeon: Lanelle Bal, DO;  Location: [facility];  Service: Endoscopy;  Laterality: N/A;  1:00pm   ESOPHAGOGASTRODUODENOSCOPY (EGD) WITH PROPOFOL N/A 11/19/2022    Procedure: ESOPHAGOGASTRODUODENOSCOPY (EGD) WITH PROPOFOL;  Surgeon: Lanelle Bal, DO;  Location: [facility];  Service: Endoscopy;  Laterality: N/A;  11:30 am, asa 3   HAND SURGERY     KNEE SURGERY      Family History  Problem Relation Age of Onset   Cancer Mother    Cardiomyopathy Father    Seizures Sister        childhood   Seizures Brother        in his 3s   Colon cancer Neg Hx     Allergies as of 03/02/2023 - Review Complete 03/02/2023  Allergen Reaction Noted   Ace inhibitors Other (See Comments) 08/29/2021   Codeine  03/09/2018   Sulfa antibiotics  03/25/2021    Social History   Socioeconomic History   Marital status: Widowed    Spouse name: Not on file   Number of children: Not on file   Years of education: Not on file   Highest education level: Not on file  Occupational History   Not on file  Tobacco Use   Smoking status: Never   Smokeless tobacco: Never  Vaping Use   Vaping Use: Never used  Substance and Sexual Activity   Alcohol use: Never   Drug use: Never   Sexual activity: Not Currently  Other Topics Concern   Not on file  Social History Narrative   Lives at [facility]   Social Determinants of Health   Financial Resource Strain: Not on file  Food Insecurity: No Food Insecurity (02/20/2023)   Hunger Vital Sign    Worried About Running Out of Food in the Last Year: Never true    Ran Out of Food in the Last Year: Never true  Transportation Needs: No Transportation Needs (02/20/2023)   PRAPARE - Administrator, Civil Service (Medical): No    Lack of Transportation (Non-Medical): No  Physical Activity: Not on file  Stress: Not on file  Social Connections: Not on file     Review of Systems   Gen: Denies fever, chills, anorexia. Denies fatigue, weakness, weight loss.  CV: Denies chest pain, palpitations, syncope, peripheral edema, and claudication. Resp: Denies dyspnea at rest, cough, wheezing, coughing up blood,  and pleurisy. GI: See HPI Derm: Denies rash, itching, dry skin Psych: Denies depression, anxiety, memory loss, confusion. No homicidal or suicidal ideation.  Heme: Denies bruising, bleeding, and enlarged lymph nodes.   Physical Exam   BP 124/71 (BP Location: Left Arm, Patient Position: Sitting, Cuff Size: Large)   Pulse 64   Temp 97.7 F (36.5 C) (Temporal)   Wt 177 lb (80.3 kg)   SpO2 96%   BMI 32.37 kg/m   General:   Alert and oriented. No distress noted. Pleasant and cooperative.  Head:  Normocephalic and atraumatic. Eyes:  Conjuctiva clear without scleral icterus. Mouth:  Oral mucosa pink and moist. Good dentition. No lesions. Lungs:  Clear to auscultation bilaterally. No wheezes, rales, or rhonchi. No distress.  Heart:  S1, S2 present without murmurs appreciated.  Abdomen:  +BS, soft, non-distended. TTP to LUQ, LLQ, left flank, and mild RLQ tenderness. No rebound or guarding. No HSM or masses noted. Rectal: deferred Msk:  Symmetrical without gross deformities. Normal posture. Extremities:  No movement, mild sensation. No swelling . Neurologic:  Alert and  oriented x4 Psych:  Alert and cooperative. Normal mood and affect.   Assessment  [name] is a 87 y.o. female with a history of anemia, diabetes, HTN, HLD, central cord syndrome at C4 secondary to fall in May  2022 presenting today for follow up of a good appetite, constipation, and abdominal pain.   Constipation, LLQ pain, recent diverticulitis: She has had chronic left-sided abdominal pain and during most recent hospitalization for UTI she had a CT scan which revealed some mild diverticulitis.  Per patient and her primary care provider can see if she is having regular bowel movements on Linzess 72 mcg daily and MiraLAX daily with some recent mild diarrhea secondary to antibiotic use.  I would recommend a full 10 days versus 7 days of Augmentin I will reach out to Ms. Green to discuss patient's abdominal pain, bowel  regimen, and to see if colonoscopy would be feasible.  Patient would like to proceed with colonoscopy to rule out any cancer that could be causing her left-sided abdominal pain.  Given her age and polypharmacy she is not a great candidate for dicyclomine or Levsin.  She might could benefit from an increase of Linzess given its known efficacy to help with abdominal pain at higher doses of 145 and 290.  GERD, dysphagia, poor appetite: Heartburn/reflux symptoms well-controlled with omeprazole 40 mg twice daily.  Per her provider at [facility] in May she reported her appetite was good overall and requested to discontinue Ensure supplements given her weight has been stable.  Her weight continues to be stable however patient continues to report lack of appetite however I suspect this is primarily related to her lack of interest/taste of foods at the facility.  Her daughter has previously reported that if she request food from home that she will eat the food that she brings to her if she has not already had a meal does pick at this and does not eat all of it and make waste some of it.  I relayed to the patient and the daughter today that I would consult with her primary provider at [facility] to discuss if there is a need for appetite stimulant otherwise we will continue with daily multivitamin and continuing to monitor her weight.  Recent EGD with gastritis and no evidence of esophageal abnormality that would explain any dysphagia.  PLAN   Continue omeprazole 40 mg twice daily Continue MiraLAX once daily Linzess 72 mcg once daily, could consider increasing to help with abdominal pain Monitor weight weekly Multivitamin daily Finish antibiotic course, would recommend full 10 days versus 7 days of therapy. TCS vs flex sig -patient would like to proceed once safe.  Will discuss also with Mrs Chilton Si.  Plan to discuss plan of care with patient's PCP Mrs. Green.  Follow up in 4 months    Brooke Bonito, MSN, FNP-BC,  AGACNP-BC [facility]  Addendum: Communicated with patient's PCP Synthia Innocent who reports that her weight has been stable and that staff have reported that she eats her meals with a good appetite. She stated she is unsure if prep for colonoscopy would be feasible but if patient insists then they will attempt. She will trial increasing her Linzess. All of the recommendations above were relayed.

## 2023-03-02 NOTE — Patient Instructions (Addendum)
For now we will continue omeprazole 40 mg twice daily, MiraLAX daily, and Linzess 72 mcg once daily.  I will discuss medications including appetite stimulant, bowel regimen, and performing colonoscopy with Mrs. Green the [employer].  Continue full course of Augmentin.  Continue multivitamin daily.  Will plan to follow-up in 4 months.   It was a pleasure to see you today. I want to create trusting relationships with patients. If you receive a survey regarding your visit,  I greatly appreciate you taking time to fill this out on paper or through your MyChart. I value your feedback.  Brooke Bonito, MSN, FNP-BC, AGACNP-BC [facility]

## 2023-03-12 ENCOUNTER — Non-Acute Institutional Stay (SKILLED_NURSING_FACILITY): Payer: Medicare HMO | Admitting: Adult Health

## 2023-03-12 ENCOUNTER — Encounter: Payer: Self-pay | Admitting: Adult Health

## 2023-03-12 DIAGNOSIS — S14124S Central cord syndrome at C4 level of cervical spinal cord, sequela: Secondary | ICD-10-CM | POA: Diagnosis not present

## 2023-03-12 DIAGNOSIS — N1831 Chronic kidney disease, stage 3a: Secondary | ICD-10-CM | POA: Diagnosis not present

## 2023-03-12 DIAGNOSIS — I129 Hypertensive chronic kidney disease with stage 1 through stage 4 chronic kidney disease, or unspecified chronic kidney disease: Secondary | ICD-10-CM | POA: Diagnosis not present

## 2023-03-12 DIAGNOSIS — I700 Atherosclerosis of aorta: Secondary | ICD-10-CM

## 2023-03-12 DIAGNOSIS — E1122 Type 2 diabetes mellitus with diabetic chronic kidney disease: Secondary | ICD-10-CM | POA: Diagnosis not present

## 2023-03-12 NOTE — Progress Notes (Signed)
Location:  [facility] Room Number: 154 Place of Service:  SNF (31)   CODE STATUS: dnr   Allergies  Allergen Reactions   Ace Inhibitors Other (See Comments)    Hyperkalemia    Codeine     Unknown reaction   Sulfa Antibiotics     Unknown reaction    Chief Complaint  Patient presents with   Acute Visit    Acute care plan meeting     HPI:  We have come to together for her care plan meeting. BIMS 15/15 mood 4/30: decreased appetite; nervous at times. She is bed bound; does get out of bed on occasions no falls. She is dependent assist with her adls. She is incontinent of bowel; has foley. Dietary: requires set up for meals regular diet weight is 178.5 pounds appetite 75-100%. Therapy: none at this time. Activities: 1:1 visits she continues to be followed for her chronic illnesses including:  Aortic atherosclerosis   Hypertension associated with stage 3 chronic kidney disease due to type 2 diabetes mellitus   Central cord syndrome at C4 level of cervical spine sequela  Past Medical History:  Diagnosis Date   Adult failure to thrive    Anemia    Anxiety    Atherosclerosis of aorta (HCC)    Central cord syndrome at C4 level of cervical spinal cord, subsequent encounter (HCC)    CKD (chronic kidney disease)    stage 3   Depression    DM type 2 with diabetic peripheral neuropathy (HCC)    Dysphagia    GERD (gastroesophageal reflux disease)    Gout    High cholesterol    HTN (hypertension)    Hyponatremia    MDD (major depressive disorder)    Neck pain    Neuropathy    Quadriplegia, C1-C4 incomplete (HCC)    Radiculopathy    RLS (restless legs syndrome)    Urinary retention     Past Surgical History:  Procedure Laterality Date   ABDOMINAL HYSTERECTOMY     ANTERIOR CERVICAL DECOMP/DISCECTOMY FUSION N/A 02/05/2021   Procedure: Cervical Three-Four  Anterior cervical decompression/discectomy/fusion;  Surgeon: Coletta Memos, MD;  Location: [facility] OR;   Service: Neurosurgery;  Laterality: N/A;  RM 20   APPENDECTOMY     BACK SURGERY     BALLOON DILATION N/A 11/19/2022   Procedure: BALLOON DILATION;  Surgeon: Lanelle Bal, DO;  [facility]: [facility];  Service: Endoscopy;  Laterality: N/A;   BIOPSY  09/22/2021   Procedure: BIOPSY;  Surgeon: Lanelle Bal, DO;  [facility];  Service: Endoscopy;;   BIOPSY  11/19/2022   Procedure: BIOPSY;  Surgeon: Lanelle Bal, DO;  [facility];  Service: Endoscopy;;   CERVICAL DISC SURGERY     ESOPHAGOGASTRODUODENOSCOPY (EGD) WITH PROPOFOL N/A 09/22/2021   Procedure: ESOPHAGOGASTRODUODENOSCOPY (EGD) WITH PROPOFOL;  Surgeon: Lanelle Bal, DO;  [facility];  Service: Endoscopy;  Laterality: N/A;  1:00pm   ESOPHAGOGASTRODUODENOSCOPY (EGD) WITH PROPOFOL N/A 11/19/2022   Procedure: ESOPHAGOGASTRODUODENOSCOPY (EGD) WITH PROPOFOL;  Surgeon: Lanelle Bal, DO;  [facility];  Service: Endoscopy;  Laterality: N/A;  11:30 am, asa 3   HAND SURGERY     KNEE SURGERY      Social History   Socioeconomic History   Marital status: Widowed    Spouse name: Not on file   Number of children: Not on file   Years of education: Not on file  Highest education level: Not on file  Occupational History   Not on file  Tobacco Use   Smoking status: Never   Smokeless tobacco: Never  Vaping Use   Vaping status: Never Used  Substance and Sexual Activity   Alcohol use: Never   Drug use: Never   Sexual activity: Not Currently  Other Topics Concern   Not on file  Social History Narrative   Lives at [facility]   Social Determinants of Health   Financial Resource Strain: Not on file  Food Insecurity: No Food Insecurity (02/20/2023)   Hunger Vital Sign    Worried About Running Out of Food in the Last Year: Never true    Ran Out of Food in the Last Year: Never true  Transportation Needs: No Transportation Needs (02/20/2023)   PRAPARE - Therapist, art (Medical): No    Lack of Transportation (Non-Medical): No  Physical Activity: Not on file  Stress: Not on file  Social Connections: Not on file  Intimate Partner Violence: Not At Risk (02/20/2023)   Humiliation, Afraid, Rape, and Kick questionnaire    Fear of Current or Ex-Partner: No    Emotionally Abused: No    Physically Abused: No    Sexually Abused: No   Family History  Problem Relation Age of Onset   Cancer Mother    Cardiomyopathy Father    Seizures Sister        childhood   Seizures Brother        in his 18s   Colon cancer Neg Hx       VITAL SIGNS BP 118/60   Pulse 84   Temp 98.8 F (37.1 C)   Resp 20   Ht 5\' 2"  (1.575 m)   Wt 178 lb 8 oz (81 kg)   SpO2 97%   BMI 32.65 kg/m   Outpatient Encounter Medications as of 03/12/2023  Medication Sig   acetaminophen (TYLENOL) 325 MG tablet Take 650 mg by mouth every 8 (eight) hours.   AMBULATORY NON FORMULARY MEDICATION Medication Name:  Continue monthly catheter changes with 69fr catheter at skilled nursing facility.   amLODipine (NORVASC) 5 MG tablet Take 5 mg by mouth daily.   aspirin 81 MG chewable tablet Chew 81 mg by mouth daily.   augmented betamethasone dipropionate (DIPROLENE-AF) 0.05 % ointment Apply 1 Application topically 2 (two) times daily as needed. Apply to stomach and both breast   calcium-vitamin D (OSCAL WITH D) 500-5 MG-MCG tablet Take 1 tablet by mouth 2 (two) times daily.   camphor-menthol (SARNA) lotion Apply 1 Application topically 2 (two) times daily as needed for itching.   dapagliflozin propanediol (FARXIGA) 5 MG TABS tablet Take 5 mg by mouth daily.   DULoxetine (CYMBALTA) 20 MG capsule Take 20 mg by mouth daily.   feeding supplement (ENSURE ENLIVE / ENSURE PLUS) LIQD Take 237 mLs by mouth daily.   ferrous sulfate 325 (65 FE) MG EC tablet Take 1 tablet (325 mg total) by mouth daily with breakfast.   fexofenadine (ALLEGRA) 60 MG tablet Take 60 mg by mouth daily.    gabapentin (NEURONTIN) 300 MG capsule Take 300 mg by mouth 3 (three) times daily.   insulin aspart (NOVOLOG FLEXPEN) 100 UNIT/ML FlexPen Inject 5 Units into the skin 3 (three) times daily with meals. 8 am, 12 pm, and 6 pm   Insulin Pen Needle 30G X 5 MM MISC 1 Device by Does not apply route daily. 3/16"  ipratropium-albuterol (DUONEB) 0.5-2.5 (3) MG/3ML SOLN Take 3 mLs by nebulization every 6 (six) hours as needed (wheezing).   LANTUS SOLOSTAR 100 UNIT/ML Solostar Pen Inject 25 Units into the skin at bedtime.   levETIRAcetam (KEPPRA) 750 MG tablet Take 750 mg by mouth 2 (two) times daily.   linaclotide (LINZESS) 72 MCG capsule Take 1 capsule (72 mcg total) by mouth daily before breakfast.   LORazepam (ATIVAN) 2 MG/ML injection Inject 0.5 mLs (1 mg total) into the muscle every 15 (fifteen) minutes as needed.   melatonin 5 MG TABS Take 5 mg by mouth at bedtime.   Multiple Vitamins-Minerals (THEREMS M PO) Take 1 tablet by mouth daily. (multivitamin with folic acid) tablet; 400 mcg;   NON FORMULARY Diet - Regular   omeprazole (PRILOSEC) 40 MG capsule Take 1 capsule (40 mg total) by mouth in the morning and at bedtime.   Phenylephrine-Cocoa Butter 0.25-85.5 % SUPP Place 1 suppository rectally 2 (two) times daily as needed.   polyethylene glycol (MIRALAX / GLYCOLAX) 17 g packet Take 17 g by mouth daily.   rOPINIRole (REQUIP) 1 MG tablet Take 1 mg by mouth at bedtime.   rosuvastatin (CRESTOR) 20 MG tablet Take 20 mg by mouth every evening.   sodium bicarbonate 650 MG tablet Take 1 tablet (650 mg total) by mouth 2 (two) times daily.   tiZANidine (ZANAFLEX) 2 MG tablet Take 2 mg by mouth every 6 (six) hours as needed for muscle spasms (for back and sciatic pain).   zinc oxide 20 % ointment Apply 1 Application topically See admin instructions. Apply every shift to bilateral buttocks, sacrum, and coccyx   No facility-administered encounter medications on file as of 03/12/2023.     SIGNIFICANT DIAGNOSTIC  EXAMS  PREVIOUS   04-15-22; dexa: t score -3.391  NO NEW EXAMS     LABS REVIEWED PREVIOUS   02-11-22: urine culture: multiple bacteria  04-09-22: liver normal protein 6.0 albumin 3.3; chol 143; ldl 81; trig 71; hdl 48; tsh 3.502  04-27-22; wbc 6.6; hgb 8.7; hct 26.2; mcv 94.6 plt 231; glucose 152; bun 66; creat 1.26; k+ 4.7; na++ 132; ca 8.9; gfr 42; protein 6.7 albumin 3.4 hgb a1c 7.0; vitamin D 37.96 07-09-22: wbc 9.3; hgb 8.5; hct 26.9; mcv 96.8 plt 196; glucose 200; bun 65; creat 2.15; k+ 5.0; na++ 128; ca 7.8; gfr 22 07-10-22: glucose 130; bun 59; creat 1.75; k+ 4.2; na++ 131; ca 7.6; gfr 29 07-30-22: urine micro-albumin 59.7; ACR 127   08-20-22: hgb A1c 8.8 10-01-22: wbc 5.6; hgb 9.6; hct 29.9; mcv 94.9 plt 245; glucose 404; bun 43; creat 1.69; k+ 5.2; na++ 135; ca 8.2 gfr 30 protein 7.2 albumin 3.5 10-19-22: glucose 176; bun 45; creat 1.63; k+ 5.8; na++ 134; ca 8.9; gfr 31 10-31-22: wbc 4.6; hgb 8.4; hct 26.5; mcv 94.0 plt 183; glucose 93; bun 46; creat 1.64; k+ 5.2; na++ 134; ca 8.6; gfr 31; protein 6.5 albumin 3.2  11-05-22: glucose 117; bun 35; creat 1.25; k+ 4.3;na++ 137; ca 7.5; gfr 43; hgb A1c 8.5; vitamin D 26.12; chol 149; ldl 73 trig 166; hdl 43  01-25-23: wbc 4.5; hgb 8.8; hct 27.0; mcv 94.7 plt 178; glucose 163; bun 45; creat 1.58; k+ 5.8; na++ 134; ca 8.7; gfr 32; protein 6.6 albumin 3.4 iron 54; tibc 305; vitamin B12: 496; keppra 80.7 (normal 10-40) 01-26-23: glucose 116; bun 41; creat 1.54; k+ 5.5; na++ 133; ca 8.5 gfr 33   02-08-23: keppra 41.6 (10-40)  NO NEW LABS.  Review of Systems  Constitutional:  Negative for malaise/fatigue.  Respiratory:  Negative for cough and shortness of breath.   Cardiovascular:  Negative for chest pain, palpitations and leg swelling.  Gastrointestinal:  Negative for abdominal pain, constipation and heartburn.  Musculoskeletal:  Negative for back pain, joint pain and myalgias.  Skin: Negative.   Neurological:  Negative for dizziness.   Psychiatric/Behavioral:  The patient is not nervous/anxious.     Physical Exam Constitutional:      General: She is not in acute distress.    Appearance: She is well-developed. She is not diaphoretic.  Neck:     Thyroid: No thyromegaly.  Cardiovascular:     Rate and Rhythm: Normal rate and regular rhythm.     Pulses: Normal pulses.     Heart sounds: Normal heart sounds.  Pulmonary:     Effort: Pulmonary effort is normal. No respiratory distress.     Breath sounds: Normal breath sounds.  Abdominal:     General: Bowel sounds are normal. There is no distension.     Palpations: Abdomen is soft.     Tenderness: There is no abdominal tenderness.  Genitourinary:    Comments: foley Musculoskeletal:     Cervical back: Neck supple.     Right lower leg: No edema.     Left lower leg: No edema.     Comments: Does not move lower extremities    Lymphadenopathy:     Cervical: No cervical adenopathy.  Skin:    General: Skin is warm and dry.  Neurological:     Mental Status: She is alert and oriented to person, place, and time.  Psychiatric:        Mood and Affect: Mood normal.       ASSESSMENT/ PLAN:  TODAY  Aortic atherosclerosis Hypertension associated with stage 3 chronic kidney disease due to type 2 diabetes mellitus  Central cord syndrome at C4 level of cervical spine sequela   Will continue current medications Will continue current plan of care Will continue to monitor her status.   Time spent with patient: 40 minutes: medication; plan of care; dietary    Synthia Innocent NP [facility]  call [phone]

## 2023-03-22 DIAGNOSIS — R627 Adult failure to thrive: Secondary | ICD-10-CM | POA: Diagnosis not present

## 2023-03-22 DIAGNOSIS — R488 Other symbolic dysfunctions: Secondary | ICD-10-CM | POA: Diagnosis not present

## 2023-03-23 ENCOUNTER — Encounter (HOSPITAL_COMMUNITY): Payer: Self-pay

## 2023-03-23 ENCOUNTER — Other Ambulatory Visit: Payer: Self-pay

## 2023-03-23 ENCOUNTER — Emergency Department (HOSPITAL_COMMUNITY)
Admission: EM | Admit: 2023-03-23 | Discharge: 2023-03-23 | Disposition: A | Discharge status: Home / Self Care | Payer: Medicare HMO | Source: Home / Self Care | Attending: Emergency Medicine | Admitting: Emergency Medicine

## 2023-03-23 ENCOUNTER — Emergency Department (HOSPITAL_COMMUNITY): Payer: Medicare HMO

## 2023-03-23 ENCOUNTER — Ambulatory Visit (INDEPENDENT_AMBULATORY_CARE_PROVIDER_SITE_OTHER): Payer: Medicare HMO | Admitting: Urology

## 2023-03-23 ENCOUNTER — Encounter: Payer: Self-pay | Admitting: Urology

## 2023-03-23 VITALS — BP 136/74 | HR 73 | Temp 98.0°F

## 2023-03-23 DIAGNOSIS — I10 Essential (primary) hypertension: Secondary | ICD-10-CM | POA: Diagnosis not present

## 2023-03-23 DIAGNOSIS — R569 Unspecified convulsions: Secondary | ICD-10-CM | POA: Diagnosis not present

## 2023-03-23 DIAGNOSIS — Z87440 Personal history of urinary (tract) infections: Secondary | ICD-10-CM | POA: Diagnosis not present

## 2023-03-23 DIAGNOSIS — R488 Other symbolic dysfunctions: Secondary | ICD-10-CM | POA: Diagnosis not present

## 2023-03-23 DIAGNOSIS — R464 Slowness and poor responsiveness: Secondary | ICD-10-CM | POA: Diagnosis not present

## 2023-03-23 DIAGNOSIS — R310 Gross hematuria: Secondary | ICD-10-CM

## 2023-03-23 DIAGNOSIS — R404 Transient alteration of awareness: Secondary | ICD-10-CM

## 2023-03-23 DIAGNOSIS — K5730 Diverticulosis of large intestine without perforation or abscess without bleeding: Secondary | ICD-10-CM | POA: Diagnosis not present

## 2023-03-23 DIAGNOSIS — R4182 Altered mental status, unspecified: Secondary | ICD-10-CM | POA: Diagnosis not present

## 2023-03-23 DIAGNOSIS — R410 Disorientation, unspecified: Secondary | ICD-10-CM | POA: Insufficient documentation

## 2023-03-23 DIAGNOSIS — R627 Adult failure to thrive: Secondary | ICD-10-CM | POA: Diagnosis not present

## 2023-03-23 DIAGNOSIS — R251 Tremor, unspecified: Secondary | ICD-10-CM | POA: Insufficient documentation

## 2023-03-23 DIAGNOSIS — Z09 Encounter for follow-up examination after completed treatment for conditions other than malignant neoplasm: Secondary | ICD-10-CM

## 2023-03-23 DIAGNOSIS — I700 Atherosclerosis of aorta: Secondary | ICD-10-CM | POA: Diagnosis not present

## 2023-03-23 DIAGNOSIS — R0689 Other abnormalities of breathing: Secondary | ICD-10-CM | POA: Diagnosis not present

## 2023-03-23 DIAGNOSIS — Z79899 Other long term (current) drug therapy: Secondary | ICD-10-CM | POA: Insufficient documentation

## 2023-03-23 DIAGNOSIS — Z7982 Long term (current) use of aspirin: Secondary | ICD-10-CM | POA: Diagnosis not present

## 2023-03-23 DIAGNOSIS — R1032 Left lower quadrant pain: Secondary | ICD-10-CM | POA: Diagnosis not present

## 2023-03-23 DIAGNOSIS — R55 Syncope and collapse: Secondary | ICD-10-CM | POA: Diagnosis not present

## 2023-03-23 DIAGNOSIS — R339 Retention of urine, unspecified: Secondary | ICD-10-CM

## 2023-03-23 DIAGNOSIS — G40909 Epilepsy, unspecified, not intractable, without status epilepticus: Secondary | ICD-10-CM | POA: Diagnosis not present

## 2023-03-23 DIAGNOSIS — I6523 Occlusion and stenosis of bilateral carotid arteries: Secondary | ICD-10-CM | POA: Diagnosis not present

## 2023-03-23 DIAGNOSIS — R739 Hyperglycemia, unspecified: Secondary | ICD-10-CM | POA: Diagnosis not present

## 2023-03-23 DIAGNOSIS — Z978 Presence of other specified devices: Secondary | ICD-10-CM

## 2023-03-23 DIAGNOSIS — Z794 Long term (current) use of insulin: Secondary | ICD-10-CM | POA: Insufficient documentation

## 2023-03-23 LAB — COMPREHENSIVE METABOLIC PANEL
ALT: 21 U/L (ref 0–44)
AST: 21 U/L (ref 15–41)
Albumin: 3.6 g/dL (ref 3.5–5.0)
Alkaline Phosphatase: 67 U/L (ref 38–126)
Anion gap: 11 (ref 5–15)
BUN: 41 mg/dL — ABNORMAL HIGH (ref 8–23)
CO2: 20 mmol/L — ABNORMAL LOW (ref 22–32)
Calcium: 8.7 mg/dL — ABNORMAL LOW (ref 8.9–10.3)
Chloride: 104 mmol/L (ref 98–111)
Creatinine, Ser: 1.35 mg/dL — ABNORMAL HIGH (ref 0.44–1.00)
GFR, Estimated: 39 mL/min — ABNORMAL LOW (ref >60)
Glucose, Bld: 239 mg/dL — ABNORMAL HIGH (ref 70–99)
Potassium: 4.6 mmol/L (ref 3.5–5.1)
Sodium: 135 mmol/L (ref 135–145)
Total Bilirubin: 0.4 mg/dL (ref 0.3–1.2)
Total Protein: 7.6 g/dL (ref 6.5–8.1)

## 2023-03-23 LAB — CBC
HCT: 31 % — ABNORMAL LOW (ref 36.0–46.0)
Hemoglobin: 10 g/dL — ABNORMAL LOW (ref 12.0–15.0)
MCH: 29.9 pg (ref 26.0–34.0)
MCHC: 32.3 g/dL (ref 30.0–36.0)
MCV: 92.8 fL (ref 80.0–100.0)
Platelets: 186 10*3/uL (ref 150–400)
RBC: 3.34 MIL/uL — ABNORMAL LOW (ref 3.87–5.11)
RDW: 16.3 % — ABNORMAL HIGH (ref 11.5–15.5)
WBC: 6.3 10*3/uL (ref 4.0–10.5)
nRBC: 0 % (ref 0.0–0.2)

## 2023-03-23 LAB — URINALYSIS, W/ REFLEX TO CULTURE (INFECTION SUSPECTED)
Bilirubin Urine: NEGATIVE
Glucose, UA: >=500 mg/dL — AB
Hgb urine dipstick: NEGATIVE
Ketones, ur: NEGATIVE mg/dL
Nitrite: NEGATIVE
Protein, ur: 100 mg/dL — AB
Specific Gravity, Urine: 1.011 (ref 1.005–1.030)
pH: 8 (ref 5.0–8.0)

## 2023-03-23 MED ORDER — LEVETIRACETAM IN NACL 1000 MG/100ML IV SOLN
1000.0000 mg | Freq: Once | INTRAVENOUS | Status: AC
  Administered 2023-03-23: 1000 mg via INTRAVENOUS
  Filled 2023-03-23: qty 100

## 2023-03-23 MED ORDER — LEVETIRACETAM 1000 MG PO TABS: 1000.0000 mg | ORAL_TABLET | Freq: Two times a day (BID) | ORAL | 0 refills | Status: DC

## 2023-03-23 MED ORDER — IOHEXOL 300 MG/ML  SOLN
100.0000 mL | Freq: Once | INTRAMUSCULAR | Status: AC | PRN
  Administered 2023-03-23: 100 mL via INTRAVENOUS

## 2023-03-23 NOTE — Progress Notes (Signed)
Name: [name] DOB: [birth year] MRN: [id number]  History of Present Illness: [name] is a 87 y.o. female who presents today for follow up visit at [facility]. She is accompanied by her granddaughter Mickel Crow, who assisted with providing today's history. - GU history: 1. Chronic urinary retention. Managed with Foley catheter.  At last visit with Dr. Ronne Binning on 09/16/2022: The plan was: -Continue chronic indwelling foley. Continue to change foley monthly -followup 1 year.   Since last visit: She was admitted 02/19/2023 - 02/24/2023 for UTI with gross hematuria and diverticulitis. She was treated with Zosyn; urine cultures are multiple species. CT abdomen/pelvis w/o contrast showed: 1. Diverticular disease of left colon with minimal fat stranding adjacent to the descending colon suggestive of mild diverticulitis. 2. No hydronephrosis or ureteral stone. Decompressed urinary bladder with Foley catheter. Bladder wall appears thickened with perivesical stranding, correlate with urinalysis for cystitis. 3. Mildly atrophic left kidney.   Today: She denies prior history of gross hematuria. She denies flank or abdominal pain; reports occasional left side pain. She denies recent fevers, nausea, or vomiting.  Catheter was last exchanged 03/03/2023 per family. She reports that the catheter is not being exchanged routinely every 4 weeks despite written orders to do so. She reports at least one episode of overflow incontinence around the catheter.    Fall Screening: Do you usually have a device to assist in your mobility? Yes - wheelchair   Medications: Current Outpatient Medications  Medication Sig Dispense Refill   acetaminophen (TYLENOL) 325 MG tablet Take 650 mg by mouth every 8 (eight) hours.     AMBULATORY NON FORMULARY MEDICATION Medication Name:  Continue monthly catheter changes with 71fr catheter at skilled nursing facility. 1 application MONTHLY    amLODipine (NORVASC) 5 MG tablet Take 5 mg by mouth daily.     aspirin 81 MG chewable tablet Chew 81 mg by mouth daily.     augmented betamethasone dipropionate (DIPROLENE-AF) 0.05 % ointment Apply 1 Application topically 2 (two) times daily as needed. Apply to stomach and both breast     calcium-vitamin D (OSCAL WITH D) 500-5 MG-MCG tablet Take 1 tablet by mouth 2 (two) times daily.     camphor-menthol (SARNA) lotion Apply 1 Application topically 2 (two) times daily as needed for itching.     dapagliflozin propanediol (FARXIGA) 5 MG TABS tablet Take 5 mg by mouth daily.     DULoxetine (CYMBALTA) 20 MG capsule Take 20 mg by mouth daily.     feeding supplement (ENSURE ENLIVE / ENSURE PLUS) LIQD Take 237 mLs by mouth daily.     ferrous sulfate 325 (65 FE) MG EC tablet Take 1 tablet (325 mg total) by mouth daily with breakfast. 60 tablet 3   fexofenadine (ALLEGRA) 60 MG tablet Take 60 mg by mouth daily.     gabapentin (NEURONTIN) 300 MG capsule Take 300 mg by mouth 3 (three) times daily.     insulin aspart (NOVOLOG FLEXPEN) 100 UNIT/ML FlexPen Inject 5 Units into the skin 3 (three) times daily with meals. 8 am, 12 pm, and 6 pm     Insulin Pen Needle 30G X 5 MM MISC 1 Device by Does not apply route daily. 3/16"  0   ipratropium-albuterol (DUONEB) 0.5-2.5 (3) MG/3ML SOLN Take 3 mLs by nebulization every 6 (six) hours as needed (wheezing).     LANTUS SOLOSTAR 100 UNIT/ML Solostar Pen Inject 25 Units into the skin at bedtime.     levETIRAcetam (KEPPRA)  750 MG tablet Take 750 mg by mouth 2 (two) times daily.     linaclotide (LINZESS) 72 MCG capsule Take 1 capsule (72 mcg total) by mouth daily before breakfast. 30 capsule 2   LORazepam (ATIVAN) 2 MG/ML injection Inject 0.5 mLs (1 mg total) into the muscle every 15 (fifteen) minutes as needed. 4 mL 0   melatonin 5 MG TABS Take 5 mg by mouth at bedtime.     Multiple Vitamins-Minerals (THEREMS M PO) Take 1 tablet by mouth daily. (multivitamin with folic acid)  tablet; 400 mcg;     NON FORMULARY Diet - Regular     omeprazole (PRILOSEC) 40 MG capsule Take 1 capsule (40 mg total) by mouth in the morning and at bedtime. 90 capsule 3   Phenylephrine-Cocoa Butter 0.25-85.5 % SUPP Place 1 suppository rectally 2 (two) times daily as needed.     polyethylene glycol (MIRALAX / GLYCOLAX) 17 g packet Take 17 g by mouth daily.     rOPINIRole (REQUIP) 1 MG tablet Take 1 mg by mouth at bedtime.     rosuvastatin (CRESTOR) 20 MG tablet Take 20 mg by mouth every evening.     sodium bicarbonate 650 MG tablet Take 1 tablet (650 mg total) by mouth 2 (two) times daily.     tiZANidine (ZANAFLEX) 2 MG tablet Take 2 mg by mouth every 6 (six) hours as needed for muscle spasms (for back and sciatic pain).     zinc oxide 20 % ointment Apply 1 Application topically See admin instructions. Apply every shift to bilateral buttocks, sacrum, and coccyx     No current facility-administered medications for this visit.    Allergies: Allergies  Allergen Reactions   Ace Inhibitors Other (See Comments)    Hyperkalemia    Codeine     Unknown reaction   Sulfa Antibiotics     Unknown reaction    Past Medical History:  Diagnosis Date   Adult failure to thrive    Anemia    Anxiety    Atherosclerosis of aorta (HCC)    Central cord syndrome at C4 level of cervical spinal cord, subsequent encounter (HCC)    CKD (chronic kidney disease)    stage 3   Depression    DM type 2 with diabetic peripheral neuropathy (HCC)    Dysphagia    GERD (gastroesophageal reflux disease)    Gout    High cholesterol    HTN (hypertension)    Hyponatremia    MDD (major depressive disorder)    Neck pain    Neuropathy    Quadriplegia, C1-C4 incomplete (HCC)    Radiculopathy    RLS (restless legs syndrome)    Urinary retention    Past Surgical History:  Procedure Laterality Date   ABDOMINAL HYSTERECTOMY     ANTERIOR CERVICAL DECOMP/DISCECTOMY FUSION N/A 02/05/2021   Procedure: Cervical  Three-Four  Anterior cervical decompression/discectomy/fusion;  Surgeon: Coletta Memos, MD;  Location: [facility] OR;  Service: Neurosurgery;  Laterality: N/A;  RM 20   APPENDECTOMY     BACK SURGERY     BALLOON DILATION N/A 11/19/2022   Procedure: BALLOON DILATION;  Surgeon: Lanelle Bal, DO;  [facility];  Service: Endoscopy;  Laterality: N/A;   BIOPSY  09/22/2021   Procedure: BIOPSY;  Surgeon: Lanelle Bal, DO;  [facility];  Service: Endoscopy;;   BIOPSY  11/19/2022   Procedure: BIOPSY;  Surgeon: Lanelle Bal, DO;  [facility];  Service: Endoscopy;;  CERVICAL DISC SURGERY     ESOPHAGOGASTRODUODENOSCOPY (EGD) WITH PROPOFOL N/A 09/22/2021   Procedure: ESOPHAGOGASTRODUODENOSCOPY (EGD) WITH PROPOFOL;  Surgeon: Lanelle Bal, DO;  [facility];  Service: Endoscopy;  Laterality: N/A;  1:00pm   ESOPHAGOGASTRODUODENOSCOPY (EGD) WITH PROPOFOL N/A 11/19/2022   Procedure: ESOPHAGOGASTRODUODENOSCOPY (EGD) WITH PROPOFOL;  Surgeon: Lanelle Bal, DO;  [facility];  Service: Endoscopy;  Laterality: N/A;  11:30 am, asa 3   HAND SURGERY     KNEE SURGERY     Family History  Problem Relation Age of Onset   Cancer Mother    Cardiomyopathy Father    Seizures Sister        childhood   Seizures Brother        in his 51s   Colon cancer Neg Hx    Social History   Socioeconomic History   Marital status: Widowed    Spouse name: Not on file   Number of children: Not on file   Years of education: Not on file   Highest education level: Not on file  Occupational History   Not on file  Tobacco Use   Smoking status: Never   Smokeless tobacco: Never  Vaping Use   Vaping status: Never Used  Substance and Sexual Activity   Alcohol use: Never   Drug use: Never   Sexual activity: Not Currently  Other Topics Concern   Not on file  Social History Narrative   Lives at [facility]   Social Determinants of Health   Financial  Resource Strain: Not on file  Food Insecurity: No Food Insecurity (02/20/2023)   Hunger Vital Sign    Worried About Running Out of Food in the Last Year: Never true    Ran Out of Food in the Last Year: Never true  Transportation Needs: No Transportation Needs (02/20/2023)   PRAPARE - Administrator, Civil Service (Medical): No    Lack of Transportation (Non-Medical): No  Physical Activity: Not on file  Stress: Not on file  Social Connections: Not on file  Intimate Partner Violence: Not At Risk (02/20/2023)   Humiliation, Afraid, Rape, and Kick questionnaire    Fear of Current or Ex-Partner: No    Emotionally Abused: No    Physically Abused: No    Sexually Abused: No   Review of Systems - Limited due to patient's seizure Constitutional: Patient denies any unintentional weight loss or change in strength Cardiovascular: Patient denies chest pain or syncope Respiratory: Patient denies shortness of breath Gastrointestinal: Patient denies nausea, vomiting, or diarrhea. Reports constipation. Musculoskeletal: Patient reports paraplegia Neurologic: Patient reports history of seizures  GU: As per HPI.  OBJECTIVE Vitals:   03/23/23 1520  BP: 136/74  Pulse: 73  Temp: 98 F (36.7 C)   There is no height or weight on file to calculate BMI.  Physical Examination  Upon arrival patient was in no obvious distress and non-toxic appearing. During the visit at 1540 she began seizing and became unresponsive, which her granddaughter said was atypical. EMS was called immediately. The seizure lasted for about 4-5 minutes, after which time she remained unresponsive to verbal stimuli or sternal rub. During this episode her blood pressure remained stable and her pulse was strong & regular (around 77) until about 1547 when a pulse was no longer palpable, therefore chest compressions were initiated (provider was unaware at that time that patient has DNR). Patient quickly regained consciousness with  open eyes so compressions were stopped.  She was disoriented to person, place, and year. She had a fixed gaze; unable to follow visual cues (finger). She was able to follow commands somewhat including squeezing provider's fingers with both hands. EMS arrived and transported patient to ER; patient's granddaughter to accompany patient.   Cardiovascular: No visible lower extremity edema.  Respiratory: The patient does not have audible wheezing/stridor; respirations do not appear labored  Gastrointestinal: Abdomen non-distended Musculoskeletal: Paraplegic, appears to have left-sided weakness at baseline Skin: No obvious rashes/open sores  Neurologic: CN 2-12 grossly intact Psychiatric: Answered questions appropriately with normal affect prior to seizure onset Hematologic/Lymphatic/Immunologic: No obvious bruises or sites of spontaneous bleeding  GU: Catheter draining yellow urine   ASSESSMENT Gross hematuria - Plan: CANCELED: Urinalysis, Routine w reflex microscopic, CANCELED: BLADDER SCAN AMB NON-IMAGING  Hospital discharge follow-up  History of UTI  Urinary retention - Plan: CANCELED: Urinalysis, Routine w reflex microscopic, CANCELED: BLADDER SCAN AMB NON-IMAGING  Foley catheter in place  Seizure (HCC)  Unresponsive episode  Prior to patient's seizure, we had discussed recent hospitalization and gross hematuria.   For management of gross hematuria, we discussed possible etiologies including but not limited to: stone, traumatic catheter insertion, blood thinner use, urinary tract infection, kidney function, malignancy. Cystoscopy was advised for further evaluation - patient and granddaughter seemed to agree with that plan.   We discussed the rationale for consistent exchanges of indwelling Foley catheter once every 4 weeks by the staff at her SNF. Advised report / complaint to SNF Director of Nursing if written orders (such as this) are not being followed. Patient's granddaughter  verbalized understanding and agreement.  Patient transported to ER via EMS.    PLAN Advised the following: 1. Transported to ER via EMS.  2. Return for 1st available cystoscopy with Dr. Ronne Binning for gross hematuria eval.  No orders of the defined types were placed in this encounter.  Total time spent caring for the patient today was over 40 minutes. This includes time spent on the date of the visit reviewing the patient's chart before the visit, time spent during the visit, and time spent after the visit on documentation. Over 50% of that time was spent in face-to-face time with this patient for direct counseling. E&M based on time and complexity of medical decision making.  It has been explained that the patient is to follow regularly with their PCP in addition to all other providers involved in their care and to follow instructions provided by these respective offices. Patient advised to contact urology clinic if any urologic-pertaining questions, concerns, new symptoms or problems arise in the interim period.  There are no Patient Instructions on file for this visit.  Electronically signed by:  Donnita Falls, FNP   03/23/23    4:24 PM

## 2023-03-23 NOTE — Discharge Instructions (Addendum)
Follow-up with neurology and your primary care doctor. Increase the Keppra to 1000 mg twice a day.

## 2023-03-23 NOTE — ED Notes (Signed)
ED Provider at bedside. 

## 2023-03-23 NOTE — ED Provider Notes (Signed)
[place] EMERGENCY DEPARTMENT AT [facility] Provider Note   CSN: [id number] Arrival date & time: 03/23/23  1622     History  Chief Complaint  Patient presents with   Altered Mental Status   Seizures    [name] is a 87 y.o. female.   Altered Mental Status Associated symptoms: seizures   Seizures Patient presents from urology.  Had seizure followed by potential CPR.  Reportedly had change Foley and then became unresponsive and had seizure-like activity.  Does have history of seizures.  Did have episode here of shaking but per family member that is her "nerves".  Here it was just hands on both sides back-and-forth across her abdomen.  Reported different than her other seizure.  Did have some confusion.  has had left-sided abdominal pain for a while now.  Is at the [facility].    Home Medications Prior to Admission medications   Medication Sig Start Date End Date Taking? Authorizing Provider  acetaminophen (TYLENOL) 325 MG tablet Take 650 mg by mouth every 8 (eight) hours.    [provider]  AMBULATORY NON FORMULARY MEDICATION Medication Name:  Continue monthly catheter changes with 60fr catheter at skilled nursing facility. 08/12/21   McKenzie, Mardene Celeste, MD  amLODipine (NORVASC) 5 MG tablet Take 5 mg by mouth daily.    [provider]  aspirin 81 MG chewable tablet Chew 81 mg by mouth daily.    [provider]  augmented betamethasone dipropionate (DIPROLENE-AF) 0.05 % ointment Apply 1 Application topically 2 (two) times daily as needed. Apply to stomach and both breast    [provider]  calcium-vitamin D (OSCAL WITH D) 500-5 MG-MCG tablet Take 1 tablet by mouth 2 (two) times daily.    [provider]  camphor-menthol Wynelle Fanny) lotion Apply 1 Application topically 2 (two) times daily as needed for itching.    [provider]  dapagliflozin propanediol (FARXIGA) 5 MG TABS tablet Take 5 mg by mouth daily.     [provider]  DULoxetine (CYMBALTA) 20 MG capsule Take 20 mg by mouth daily.    [provider]  feeding supplement (ENSURE ENLIVE / ENSURE PLUS) LIQD Take 237 mLs by mouth daily.    [provider]  ferrous sulfate 325 (65 FE) MG EC tablet Take 1 tablet (325 mg total) by mouth daily with breakfast. 02/24/23 02/24/24  Burnadette Pop, MD  fexofenadine (ALLEGRA) 60 MG tablet Take 60 mg by mouth daily.    [provider]  gabapentin (NEURONTIN) 300 MG capsule Take 300 mg by mouth 3 (three) times daily.    [provider]  insulin aspart (NOVOLOG FLEXPEN) 100 UNIT/ML FlexPen Inject 5 Units into the skin 3 (three) times daily with meals. 8 am, 12 pm, and 6 pm    [provider]  Insulin Pen Needle 30G X 5 MM MISC 1 Device by Does not apply route daily. 3/16"    Sharee Holster, NP  ipratropium-albuterol (DUONEB) 0.5-2.5 (3) MG/3ML SOLN Take 3 mLs by nebulization every 6 (six) hours as needed (wheezing).    [provider]  LANTUS SOLOSTAR 100 UNIT/ML Solostar Pen Inject 25 Units into the skin at bedtime. 09/04/21   [provider]  levETIRAcetam (KEPPRA) 1000 MG tablet Take 1 tablet (1,000 mg total) by mouth 2 (two) times daily. 03/23/23   Benjiman Core, MD  linaclotide [facility]) 72 MCG capsule Take 1 capsule (72 mcg total) by mouth daily before breakfast. 10/20/22  Aida Raider, NP  LORazepam (ATIVAN) 2 MG/ML injection Inject 0.5 mLs (1 mg total) into the muscle every 15 (fifteen) minutes as needed. 12/07/22   Sharee Holster, NP  melatonin 5 MG TABS Take 5 mg by mouth at bedtime.    [provider]  Multiple Vitamins-Minerals (THEREMS M PO) Take 1 tablet by mouth daily. (multivitamin with folic acid) tablet; 400 mcg;    [provider]  NON FORMULARY Diet - Regular    [provider]  omeprazole (PRILOSEC) 40 MG capsule Take 1 capsule (40 mg total) by mouth in the morning and at bedtime. 10/20/22    Aida Raider, NP  Phenylephrine-Cocoa Butter 0.25-85.5 % SUPP Place 1 suppository rectally 2 (two) times daily as needed.    [provider]  polyethylene glycol (MIRALAX / GLYCOLAX) 17 g packet Take 17 g by mouth daily.    [provider]  rOPINIRole (REQUIP) 1 MG tablet Take 1 mg by mouth at bedtime.    [provider]  rosuvastatin (CRESTOR) 20 MG tablet Take 20 mg by mouth every evening.    [provider]  sodium bicarbonate 650 MG tablet Take 1 tablet (650 mg total) by mouth 2 (two) times daily. 02/24/23   Burnadette Pop, MD  tiZANidine (ZANAFLEX) 2 MG tablet Take 2 mg by mouth every 6 (six) hours as needed for muscle spasms (for back and sciatic pain).    [provider]  zinc oxide 20 % ointment Apply 1 Application topically See admin instructions. Apply every shift to bilateral buttocks, sacrum, and coccyx    [provider]      Allergies    Ace inhibitors, Codeine, and Sulfa antibiotics    Review of Systems   Review of Systems  Neurological:  Positive for seizures.    Physical Exam Updated Vital Signs BP (!) 143/59 (BP Location: Right Arm)   Pulse 76   Temp 97.7 F (36.5 C) (Oral)   Resp 15   Ht 5\' 2"  (1.575 m)   Wt 81 kg   SpO2 100%   BMI 32.66 kg/m  Physical Exam Vitals and nursing note reviewed.  Abdominal:     Tenderness: There is abdominal tenderness.     Comments: Left lower quadrant tenderness without rebound or guarding.  No hernia palpated.  Neurological:     Mental Status: She is alert. She is disoriented.     Comments: Initially had shaking of both hands across her chest and abdomen.  However would suppress would still follow commands but was somewhat confused.     ED Results / Procedures / Treatments   Labs (all labs ordered are listed, but only abnormal results are displayed) Labs Reviewed  COMPREHENSIVE METABOLIC PANEL - Abnormal; Notable for the following components:      Result Value    CO2 20 (*)    Glucose, Bld 239 (*)    BUN 41 (*)    Creatinine, Ser 1.35 (*)    Calcium 8.7 (*)    GFR, Estimated 39 (*)    All other components within normal limits  CBC - Abnormal; Notable for the following components:   RBC 3.34 (*)    Hemoglobin 10.0 (*)    HCT 31.0 (*)    RDW 16.3 (*)    All other components within normal limits  URINALYSIS, W/ REFLEX TO CULTURE (INFECTION SUSPECTED) - Abnormal; Notable for the following components:   APPearance CLOUDY (*)    Glucose, UA >=500 (*)  Protein, ur 100 (*)    Leukocytes,Ua LARGE (*)    Bacteria, UA FEW (*)    All other components within normal limits  URINE CULTURE    EKG EKG Interpretation Date/Time:  Tuesday March 23 2023 16:51:08 EDT Ventricular Rate:  80 PR Interval:  152 QRS Duration:  95 QT Interval:  406 QTC Calculation: 469 R Axis:   49  Text Interpretation: Sinus rhythm Confirmed by Benjiman Core [phone]) on 03/23/2023 5:03:42 PM  Radiology CT ABDOMEN PELVIS W CONTRAST  Result Date: 03/23/2023 CLINICAL DATA:  Left lower quadrant pain EXAM: CT ABDOMEN AND PELVIS WITH CONTRAST TECHNIQUE: Multidetector CT imaging of the abdomen and pelvis was performed using the standard protocol following bolus administration of intravenous contrast. RADIATION DOSE REDUCTION: This exam was performed according to the departmental dose-optimization program which includes automated exposure control, adjustment of the mA and/or kV according to patient size and/or use of iterative reconstruction technique. CONTRAST:  OMNIPAQUE IOHEXOL 300 MG/ML  SOLN COMPARISON:  02/19/2023 FINDINGS: Lower chest: No acute abnormality.  Linear areas of scarring. Hepatobiliary: Small peripheral hypodensity in the right hepatic lobe measuring 1.2 cm, most compatible with cyst, unchanged. No suspicious focal hepatic abnormality. Gallbladder unremarkable. No biliary ductal dilatation. Pancreas: No focal abnormality or ductal dilatation. Spleen: No focal  abnormality.  Normal size. Adrenals/Urinary Tract: Mildly atrophic left kidney, stable. No renal mass, stone or hydronephrosis. Adrenal glands unremarkable. Urinary bladder decompressed with Foley catheter in place. Bladder wall appears thickened with perivesical stranding. Findings similar to prior study. Stomach/Bowel: Left colonic diverticulosis. No active diverticulitis. Stomach and small bowel unremarkable. No bowel obstruction. Vascular/Lymphatic: Diffuse aortic atherosclerosis. No evidence of aneurysm or adenopathy. Reproductive: Prior hysterectomy.  No adnexal masses. Other: No free fluid or free air. Musculoskeletal: No acute bony abnormality. IMPRESSION: Left colonic diverticulosis.  No active diverticulitis. Aortic atherosclerosis. Bladder is decompressed and difficult evaluate, but there appears to be mild wall thickening and perivesical stranding. Findings are similar to prior study. Recommend clinical correlation to exclude cystitis. Electronically Signed   By: Charlett Nose M.D.   On: 03/23/2023 19:39   CT Head Wo Contrast  Result Date: 03/23/2023 CLINICAL DATA:  Seizure disorder EXAM: CT HEAD WITHOUT CONTRAST TECHNIQUE: Contiguous axial images were obtained from the base of the skull through the vertex without intravenous contrast. RADIATION DOSE REDUCTION: This exam was performed according to the departmental dose-optimization program which includes automated exposure control, adjustment of the mA and/or kV according to patient size and/or use of iterative reconstruction technique. COMPARISON:  Head CT 09/28/2021.  MRI head 07/27/2021 FINDINGS: Brain: No evidence of acute infarction, hemorrhage, hydrocephalus, extra-axial collection or mass lesion/mass effect. Vascular: Atherosclerotic calcifications are present within the cavernous internal carotid arteries. Skull: Normal. Negative for fracture or focal lesion. Sinuses/Orbits: No acute finding. Other: None. IMPRESSION: No acute intracranial  abnormality. Electronically Signed   By: Darliss Cheney M.D.   On: 03/23/2023 19:36    Procedures Procedures    Medications Ordered in ED Medications  levETIRAcetam (KEPPRA) IVPB 1000 mg/100 mL premix (0 mg Intravenous Stopped 03/23/23 1752)  iohexol (OMNIPAQUE) 300 MG/ML solution 100 mL (100 mLs Intravenous Contrast Given 03/23/23 1859)    ED Course/ Medical Decision Making/ A&P                             Medical Decision Making Amount and/or Complexity of Data Reviewed Labs: ordered. Radiology: ordered.  Risk Prescription drug management.  Patient with possible seizure.  Was witnessed at urology.  Has had previous seizure disorder.  Question of loss of pulse.  EKG reassuring.  No chest pain.  Initially was slow to answer but has improved.  Loaded on Keppra.  Head CT done and reassuring.  EKG reassuring.  Urine reviewed.  With catheter hard to determine if she has infection.  Reviewed recent culture and was negative.  Systemic white count is normal.  Do not think we need treatment this time.  CT scan done and does show improvement of the diverticulitis she previously had.  Discussed with patient in 2 different family numbers.  Has returned to baseline.  Will increase her Keppra to thousand and have her follow-up with Dr. Gerilyn Pilgrim, who she has seen before.  Potentially did not have cardiac arrest and just had difficulty breathing with a seizure or if she had had the arrest she has been stable since.  Will discharge back to nursing home.  CRITICAL CARE Performed by: Benjiman Core Total critical care time: 30 minutes Critical care time was exclusive of separately billable procedures and treating other patients. Critical care was necessary to treat or prevent imminent or life-threatening deterioration. Critical care was time spent personally by me on the following activities: development of treatment plan with patient and/or surrogate as well as nursing, discussions with consultants,  evaluation of patient's response to treatment, examination of patient, obtaining history from patient or surrogate, ordering and performing treatments and interventions, ordering and review of laboratory studies, ordering and review of radiographic studies, pulse oximetry and re-evaluation of patient's condition.         Final Clinical Impression(s) / ED Diagnoses Final diagnoses:  Seizure [facility])    Rx / DC Orders ED Discharge Orders          Ordered    levETIRAcetam (KEPPRA) 1000 MG tablet  2 times daily        03/23/23 2047              Benjiman Core, MD 03/23/23 2055

## 2023-03-23 NOTE — ED Triage Notes (Signed)
Pt sent from urology via EMS d/t possible seizure activity. 1 compression of CPR given in wheelchair at office and EMS called. BP 171/78 when EMS arrived. CBG 360. Other vitals WNL. Pt from [facility]. 500 ml bolus NS given by EMS. Pt also complains of left flank pain and states it started today.

## 2023-03-24 ENCOUNTER — Non-Acute Institutional Stay (SKILLED_NURSING_FACILITY): Payer: Medicare HMO | Admitting: Adult Health

## 2023-03-24 ENCOUNTER — Encounter: Payer: Self-pay | Admitting: Adult Health

## 2023-03-24 DIAGNOSIS — R488 Other symbolic dysfunctions: Secondary | ICD-10-CM | POA: Diagnosis not present

## 2023-03-24 DIAGNOSIS — R569 Unspecified convulsions: Secondary | ICD-10-CM | POA: Diagnosis not present

## 2023-03-24 DIAGNOSIS — R627 Adult failure to thrive: Secondary | ICD-10-CM | POA: Diagnosis not present

## 2023-03-24 LAB — URINE CULTURE

## 2023-03-24 NOTE — Progress Notes (Unsigned)
Location:  [facility] Room Number: 154-W Place of Service:  SNF (31)   CODE STATUS: DNR  Allergies  Allergen Reactions   Ace Inhibitors Other (See Comments)    Hyperkalemia    Codeine     Unknown reaction   Sulfa Antibiotics     Unknown reaction    Chief Complaint  Patient presents with   Acute Visit    For follow up ED visit.     HPI:  She had gone out of the facility for an urology appointment. She had a seizure lasting 4-5 minutes. She became less responsive. She was transferred to the ED. She was loaded on keppra. Her workup was negative. She returned to the facility. Her keppra was increased to 1 gm twice daily.  Today she is feeling good. She tells me that she does not remember the seizure yesterday.   Past Medical History:  Diagnosis Date   Adult failure to thrive    Anemia    Anxiety    Atherosclerosis of aorta (HCC)    Central cord syndrome at C4 level of cervical spinal cord, subsequent encounter (HCC)    CKD (chronic kidney disease)    stage 3   Depression    DM type 2 with diabetic peripheral neuropathy (HCC)    Dysphagia    GERD (gastroesophageal reflux disease)    Gout    High cholesterol    HTN (hypertension)    Hyponatremia    MDD (major depressive disorder)    Neck pain    Neuropathy    Quadriplegia, C1-C4 incomplete (HCC)    Radiculopathy    RLS (restless legs syndrome)    Urinary retention     Past Surgical History:  Procedure Laterality Date   ABDOMINAL HYSTERECTOMY     ANTERIOR CERVICAL DECOMP/DISCECTOMY FUSION N/A 02/05/2021   Procedure: Cervical Three-Four  Anterior cervical decompression/discectomy/fusion;  Surgeon: Coletta Memos, MD;  Location: [facility] OR;  Service: Neurosurgery;  Laterality: N/A;  RM 20   APPENDECTOMY     BACK SURGERY     BALLOON DILATION N/A 11/19/2022   Procedure: BALLOON DILATION;  Surgeon: Lanelle Bal, DO;  Location: [facility] ENDO SUITE;  Service: Endoscopy;  Laterality: N/A;   BIOPSY   09/22/2021   Procedure: BIOPSY;  Surgeon: Lanelle Bal, DO;  Location: [facility];  Service: Endoscopy;;   BIOPSY  11/19/2022   Procedure: BIOPSY;  Surgeon: Lanelle Bal, DO;  Location: [facility];  Service: Endoscopy;;   CERVICAL DISC SURGERY     ESOPHAGOGASTRODUODENOSCOPY (EGD) WITH PROPOFOL N/A 09/22/2021   Procedure: ESOPHAGOGASTRODUODENOSCOPY (EGD) WITH PROPOFOL;  Surgeon: Lanelle Bal, DO;  Location: [facility];  Service: Endoscopy;  Laterality: N/A;  1:00pm   ESOPHAGOGASTRODUODENOSCOPY (EGD) WITH PROPOFOL N/A 11/19/2022   Procedure: ESOPHAGOGASTRODUODENOSCOPY (EGD) WITH PROPOFOL;  Surgeon: Lanelle Bal, DO;  Location: [facility];  Service: Endoscopy;  Laterality: N/A;  11:30 am, asa 3   HAND SURGERY     KNEE SURGERY      Social History   Socioeconomic History   Marital status: Widowed    Spouse name: Not on file   Number of children: Not on file   Years of education: Not on file   Highest education level: Not on file  Occupational History   Not on file  Tobacco Use   Smoking status: Never   Smokeless tobacco: Never  Vaping Use   Vaping status: Never Used  Substance and Sexual Activity  Alcohol use: Never   Drug use: Never   Sexual activity: Not Currently  Other Topics Concern   Not on file  Social History Narrative   Lives at [facility]   Social Determinants of Health   Financial Resource Strain: Not on file  Food Insecurity: No Food Insecurity (02/20/2023)   Hunger Vital Sign    Worried About Running Out of Food in the Last Year: Never true    Ran Out of Food in the Last Year: Never true  Transportation Needs: No Transportation Needs (02/20/2023)   PRAPARE - Administrator, Civil Service (Medical): No    Lack of Transportation (Non-Medical): No  Physical Activity: Not on file  Stress: Not on file  Social Connections: Not on file  Intimate Partner Violence: Not At Risk (02/20/2023)   Humiliation, Afraid, Rape,  and Kick questionnaire    Fear of Current or Ex-Partner: No    Emotionally Abused: No    Physically Abused: No    Sexually Abused: No   Family History  Problem Relation Age of Onset   Cancer Mother    Cardiomyopathy Father    Seizures Sister        childhood   Seizures Brother        in his 69s   Colon cancer Neg Hx       VITAL SIGNS BP 124/64   Pulse 78   Temp (!) 97 F (36.1 C)   Resp 18   Ht 5\' 2"  (1.575 m)   Wt 178 lb 8 oz (81 kg)   SpO2 97%   BMI 32.65 kg/m   Outpatient Encounter Medications as of 03/24/2023  Medication Sig   acetaminophen (TYLENOL) 325 MG tablet Take 650 mg by mouth every 8 (eight) hours.   AMBULATORY NON FORMULARY MEDICATION Medication Name:  Continue monthly catheter changes with 48fr catheter at skilled nursing facility.   amLODipine (NORVASC) 5 MG tablet Take 5 mg by mouth daily.   aspirin 81 MG chewable tablet Chew 81 mg by mouth daily.   augmented betamethasone dipropionate (DIPROLENE-AF) 0.05 % ointment Apply 1 Application topically 2 (two) times daily as needed. Apply to stomach and both breast   calcium-vitamin D (OSCAL WITH D) 500-5 MG-MCG tablet Take 1 tablet by mouth 2 (two) times daily.   camphor-menthol (SARNA) lotion Apply 1 Application topically 2 (two) times daily as needed for itching.   dapagliflozin propanediol (FARXIGA) 5 MG TABS tablet Take 5 mg by mouth daily.   DULoxetine (CYMBALTA) 20 MG capsule Take 20 mg by mouth daily.   feeding supplement (ENSURE ENLIVE / ENSURE PLUS) LIQD Take 237 mLs by mouth daily.   ferrous sulfate 325 (65 FE) MG EC tablet Take 1 tablet (325 mg total) by mouth daily with breakfast.   fexofenadine (ALLEGRA) 60 MG tablet Take 60 mg by mouth daily.   gabapentin (NEURONTIN) 300 MG capsule Take 300 mg by mouth 3 (three) times daily.   insulin aspart (NOVOLOG FLEXPEN) 100 UNIT/ML FlexPen Inject 5 Units into the skin 3 (three) times daily with meals. 8 am, 12 pm, and 6 pm   Insulin Pen Needle 30G X 5 MM  MISC 1 Device by Does not apply route daily. 3/16"   ipratropium-albuterol (DUONEB) 0.5-2.5 (3) MG/3ML SOLN Take 3 mLs by nebulization every 6 (six) hours as needed (wheezing).   LANTUS SOLOSTAR 100 UNIT/ML Solostar Pen Inject 25 Units into the skin at bedtime.   levETIRAcetam (KEPPRA) 1000 MG tablet Take  1 tablet (1,000 mg total) by mouth 2 (two) times daily.   linaclotide (LINZESS) 72 MCG capsule Take 1 capsule (72 mcg total) by mouth daily before breakfast.   LORazepam (ATIVAN) 2 MG/ML injection Inject 0.5 mLs (1 mg total) into the muscle every 15 (fifteen) minutes as needed.   melatonin 5 MG TABS Take 5 mg by mouth at bedtime.   Multiple Vitamins-Minerals (THEREMS M PO) Take 1 tablet by mouth daily. (multivitamin with folic acid) tablet; 400 mcg;   NON FORMULARY Diet - Regular   omeprazole (PRILOSEC) 40 MG capsule Take 1 capsule (40 mg total) by mouth in the morning and at bedtime.   Phenylephrine-Cocoa Butter 0.25-85.5 % SUPP Place 1 suppository rectally 2 (two) times daily as needed.   polyethylene glycol (MIRALAX / GLYCOLAX) 17 g packet Take 17 g by mouth daily.   rOPINIRole (REQUIP) 1 MG tablet Take 1 mg by mouth at bedtime.   rosuvastatin (CRESTOR) 20 MG tablet Take 20 mg by mouth every evening.   sodium bicarbonate 650 MG tablet Take 1 tablet (650 mg total) by mouth 2 (two) times daily.   tiZANidine (ZANAFLEX) 2 MG tablet Take 2 mg by mouth every 6 (six) hours as needed for muscle spasms (for back and sciatic pain).   zinc oxide 20 % ointment Apply 1 Application topically See admin instructions. Apply every shift to bilateral buttocks, sacrum, and coccyx   No facility-administered encounter medications on file as of 03/24/2023.     SIGNIFICANT DIAGNOSTIC EXAMS  PREVIOUS   04-15-22; dexa: t score -3.391  NO NEW EXAMS     LABS REVIEWED PREVIOUS   04-09-22: liver normal protein 6.0 albumin 3.3; chol 143; ldl 81; trig 71; hdl 48; tsh 3.502  04-27-22; wbc 6.6; hgb 8.7; hct 26.2;  mcv 94.6 plt 231; glucose 152; bun 66; creat 1.26; k+ 4.7; na++ 132; ca 8.9; gfr 42; protein 6.7 albumin 3.4 hgb a1c 7.0; vitamin D 37.96 07-09-22: wbc 9.3; hgb 8.5; hct 26.9; mcv 96.8 plt 196; glucose 200; bun 65; creat 2.15; k+ 5.0; na++ 128; ca 7.8; gfr 22 07-10-22: glucose 130; bun 59; creat 1.75; k+ 4.2; na++ 131; ca 7.6; gfr 29 07-30-22: urine micro-albumin 59.7; ACR 127   08-20-22: hgb A1c 8.8 10-01-22: wbc 5.6; hgb 9.6; hct 29.9; mcv 94.9 plt 245; glucose 404; bun 43; creat 1.69; k+ 5.2; na++ 135; ca 8.2 gfr 30 protein 7.2 albumin 3.5 10-19-22: glucose 176; bun 45; creat 1.63; k+ 5.8; na++ 134; ca 8.9; gfr 31 10-31-22: wbc 4.6; hgb 8.4; hct 26.5; mcv 94.0 plt 183; glucose 93; bun 46; creat 1.64; k+ 5.2; na++ 134; ca 8.6; gfr 31; protein 6.5 albumin 3.2  11-05-22: glucose 117; bun 35; creat 1.25; k+ 4.3;na++ 137; ca 7.5; gfr 43; hgb A1c 8.5; vitamin D 26.12; chol 149; ldl 73 trig 166; hdl 43  01-25-23: wbc 4.5; hgb 8.8; hct 27.0; mcv 94.7 plt 178; glucose 163; bun 45; creat 1.58; k+ 5.8; na++ 134; ca 8.7; gfr 32; protein 6.6 albumin 3.4 iron 54; tibc 305; vitamin B12: 496; keppra 80.7 (normal 10-40) 01-26-23: glucose 116; bun 41; creat 1.54; k+ 5.5; na++ 133; ca 8.5 gfr 33   02-08-23: keppra 41.6 (10-40)  TODAY  02-18-23: hgb A1c 7.5; tsh 5.113 02-19-23: wbc 6.0; hgb 7.9; hct 24.7; mcv 95.7 plt 202; glucose 225; bun 45; creat 1.76; k+ 6.4; na++ 130; ca 8.7; gfr 28; (repeat k+ 5.4) 02-20-23: wbc 6.2; hgb 7.5; hct 24.1; mcv 95.6 plt 186;glucose 195;  bun 45; creat 1.76; k+ 5.6; na++ 134; ca 8.5 gfr 28; protein 6.2; albumin 3.1 free t4: 0.80; mag 1.8 02-21-23: vitamin B12 781; folate 26.1; iron 26; tibc 266; ferritin 139 03-23-23: wbc 6.3; hgb 10.0; hct 31.0; mcv 92.8 plt 186; glucose 239; bun 41; creat 1.35; k+ 4.6; na++ 135; ca 8.7; gfr 39; protein 7.6 albumin 3.6     Review of Systems  Constitutional:  Negative for malaise/fatigue.  Respiratory:  Negative for cough and shortness of breath.    Cardiovascular:  Negative for chest pain, palpitations and leg swelling.  Gastrointestinal:  Negative for abdominal pain, constipation and heartburn.  Musculoskeletal:  Negative for back pain, joint pain and myalgias.  Skin: Negative.   Neurological:  Negative for dizziness.  Psychiatric/Behavioral:  The patient is not nervous/anxious.    Physical Exam Constitutional:      General: She is not in acute distress.    Appearance: She is well-developed. She is obese. She is not diaphoretic.  Neck:     Thyroid: No thyromegaly.  Cardiovascular:     Rate and Rhythm: Normal rate and regular rhythm.     Pulses: Normal pulses.     Heart sounds: Normal heart sounds.  Pulmonary:     Effort: Pulmonary effort is normal. No respiratory distress.     Breath sounds: Normal breath sounds.  Abdominal:     General: Bowel sounds are normal. There is no distension.     Palpations: Abdomen is soft.     Tenderness: There is no abdominal tenderness.  Genitourinary:    Comments: foley Musculoskeletal:     Cervical back: Neck supple.     Right lower leg: No edema.     Left lower leg: No edema.     Comments: Limited ROM in upper extremities Does not move lower extremities   Lymphadenopathy:     Cervical: No cervical adenopathy.  Skin:    General: Skin is warm and dry.  Neurological:     Mental Status: She is alert and oriented to person, place, and time.  Psychiatric:        Mood and Affect: Mood normal.       ASSESSMENT/ PLAN:  TODAY  Seizures: will continue keppra 1 gm twice daily and will continue to monitor her status.    Synthia Innocent NP [facility]  call [phone]

## 2023-03-25 ENCOUNTER — Encounter: Payer: Self-pay | Admitting: Adult Health

## 2023-03-25 DIAGNOSIS — R627 Adult failure to thrive: Secondary | ICD-10-CM | POA: Diagnosis not present

## 2023-03-25 DIAGNOSIS — R488 Other symbolic dysfunctions: Secondary | ICD-10-CM | POA: Diagnosis not present

## 2023-03-29 DIAGNOSIS — R627 Adult failure to thrive: Secondary | ICD-10-CM | POA: Diagnosis not present

## 2023-03-29 DIAGNOSIS — R488 Other symbolic dysfunctions: Secondary | ICD-10-CM | POA: Diagnosis not present

## 2023-03-31 ENCOUNTER — Encounter: Payer: Self-pay | Admitting: Adult Health

## 2023-03-31 ENCOUNTER — Non-Acute Institutional Stay (SKILLED_NURSING_FACILITY): Payer: Medicare HMO | Admitting: Adult Health

## 2023-03-31 DIAGNOSIS — I700 Atherosclerosis of aorta: Secondary | ICD-10-CM | POA: Diagnosis not present

## 2023-03-31 DIAGNOSIS — G8252 Quadriplegia, C1-C4 incomplete: Secondary | ICD-10-CM | POA: Diagnosis not present

## 2023-03-31 DIAGNOSIS — R059 Cough, unspecified: Secondary | ICD-10-CM | POA: Diagnosis not present

## 2023-03-31 DIAGNOSIS — F419 Anxiety disorder, unspecified: Secondary | ICD-10-CM | POA: Diagnosis not present

## 2023-03-31 DIAGNOSIS — R509 Fever, unspecified: Secondary | ICD-10-CM | POA: Diagnosis not present

## 2023-03-31 DIAGNOSIS — S14124S Central cord syndrome at C4 level of cervical spinal cord, sequela: Secondary | ICD-10-CM | POA: Diagnosis not present

## 2023-03-31 DIAGNOSIS — I517 Cardiomegaly: Secondary | ICD-10-CM | POA: Diagnosis not present

## 2023-03-31 DIAGNOSIS — R0989 Other specified symptoms and signs involving the circulatory and respiratory systems: Secondary | ICD-10-CM | POA: Diagnosis not present

## 2023-03-31 NOTE — Progress Notes (Signed)
Location:  [facility] Room Number: [id number] Place of Service:  SNF (31)   CODE STATUS: dnr   Allergies  Allergen Reactions   Ace Inhibitors Other (See Comments)    Hyperkalemia    Codeine     Unknown reaction   Sulfa Antibiotics     Unknown reaction    Chief Complaint  Patient presents with   Medical Management of Chronic Issues        Chronic anxiety:    Protein calorie malnutrition severe:  Aortic atherosclerosis  Central cord syndrome at C4 level of cervical spine compression subsequent encounter/cord compression quadriplegia C1-4     HPI:  She is a 87 year old long term resident of this facility being seen for the management of her chronic illnesses:  Chronic anxiety:    Protein calorie malnutrition severe:  Aortic atherosclerosis  Central cord syndrome at C4 level of cervical spine compression subsequent encounter/cord compression quadriplegia C1-4.  She has not had any further visits to the ED.  She does have occasional left lower quad pain. There are no reports of insomnia present.   Past Medical History:  Diagnosis Date   Adult failure to thrive    Anemia    Anxiety    Atherosclerosis of aorta (HCC)    Central cord syndrome at C4 level of cervical spinal cord, subsequent encounter (HCC)    CKD (chronic kidney disease)    stage 3   Depression    DM type 2 with diabetic peripheral neuropathy (HCC)    Dysphagia    GERD (gastroesophageal reflux disease)    Gout    High cholesterol    HTN (hypertension)    Hyponatremia    MDD (major depressive disorder)    Neck pain    Neuropathy    Quadriplegia, C1-C4 incomplete (HCC)    Radiculopathy    RLS (restless legs syndrome)    Urinary retention     Past Surgical History:  Procedure Laterality Date   ABDOMINAL HYSTERECTOMY     ANTERIOR CERVICAL DECOMP/DISCECTOMY FUSION N/A 02/05/2021   Procedure: Cervical Three-Four  Anterior cervical decompression/discectomy/fusion;  Surgeon: Coletta Memos,  MD;  Location: [facility] OR;  Service: Neurosurgery;  Laterality: N/A;  RM 20   APPENDECTOMY     BACK SURGERY     BALLOON DILATION N/A 11/19/2022   Procedure: BALLOON DILATION;  Surgeon: Lanelle Bal, DO;  Location: [facility];  Service: Endoscopy;  Laterality: N/A;   BIOPSY  09/22/2021   Procedure: BIOPSY;  Surgeon: Lanelle Bal, DO;  Location: [facility];  Service: Endoscopy;;   BIOPSY  11/19/2022   Procedure: BIOPSY;  Surgeon: Lanelle Bal, DO;  Location: [facility];  Service: Endoscopy;;   CERVICAL DISC SURGERY     ESOPHAGOGASTRODUODENOSCOPY (EGD) WITH PROPOFOL N/A 09/22/2021   Procedure: ESOPHAGOGASTRODUODENOSCOPY (EGD) WITH PROPOFOL;  Surgeon: Lanelle Bal, DO;  Location: [facility];  Service: Endoscopy;  Laterality: N/A;  1:00pm   ESOPHAGOGASTRODUODENOSCOPY (EGD) WITH PROPOFOL N/A 11/19/2022   Procedure: ESOPHAGOGASTRODUODENOSCOPY (EGD) WITH PROPOFOL;  Surgeon: Lanelle Bal, DO;  Location: [facility];  Service: Endoscopy;  Laterality: N/A;  11:30 am, asa 3   HAND SURGERY     KNEE SURGERY      Social History   Socioeconomic History   Marital status: Widowed    Spouse name: Not on file   Number of children: Not on file   Years of education: Not on file  Highest education level: Not on file  Occupational History   Not on file  Tobacco Use   Smoking status: Never   Smokeless tobacco: Never  Vaping Use   Vaping status: Never Used  Substance and Sexual Activity   Alcohol use: Never   Drug use: Never   Sexual activity: Not Currently  Other Topics Concern   Not on file  Social History Narrative   Lives at [facility]   Social Determinants of Health   Financial Resource Strain: Not on file  Food Insecurity: No Food Insecurity (02/20/2023)   Hunger Vital Sign    Worried About Running Out of Food in the Last Year: Never true    Ran Out of Food in the Last Year: Never true  Transportation Needs: No Transportation Needs (02/20/2023)    PRAPARE - Administrator, Civil Service (Medical): No    Lack of Transportation (Non-Medical): No  Physical Activity: Not on file  Stress: Not on file  Social Connections: Not on file  Intimate Partner Violence: Not At Risk (02/20/2023)   Humiliation, Afraid, Rape, and Kick questionnaire    Fear of Current or Ex-Partner: No    Emotionally Abused: No    Physically Abused: No    Sexually Abused: No   Family History  Problem Relation Age of Onset   Cancer Mother    Cardiomyopathy Father    Seizures Sister        childhood   Seizures Brother        in his 30s   Colon cancer Neg Hx       VITAL SIGNS BP 122/61   Pulse 76   Temp 98.7 F (37.1 C)   Resp 18   Ht 5\' 2"  (1.575 m)   Wt 178 lb 8 oz (81 kg)   SpO2 97%   BMI 32.65 kg/m   Outpatient Encounter Medications as of 03/31/2023  Medication Sig   acetaminophen (TYLENOL) 325 MG tablet Take 650 mg by mouth every 8 (eight) hours.   AMBULATORY NON FORMULARY MEDICATION Medication Name:  Continue monthly catheter changes with 71fr catheter at skilled nursing facility.   amLODipine (NORVASC) 5 MG tablet Take 5 mg by mouth daily.   aspirin 81 MG chewable tablet Chew 81 mg by mouth daily.   augmented betamethasone dipropionate (DIPROLENE-AF) 0.05 % ointment Apply 1 Application topically 2 (two) times daily as needed. Apply to stomach and both breast   calcium-vitamin D (OSCAL WITH D) 500-5 MG-MCG tablet Take 1 tablet by mouth 2 (two) times daily.   camphor-menthol (SARNA) lotion Apply 1 Application topically 2 (two) times daily as needed for itching.   dapagliflozin propanediol (FARXIGA) 5 MG TABS tablet Take 5 mg by mouth daily.   DULoxetine (CYMBALTA) 20 MG capsule Take 20 mg by mouth daily.   feeding supplement (ENSURE ENLIVE / ENSURE PLUS) LIQD Take 237 mLs by mouth daily.   ferrous sulfate 325 (65 FE) MG EC tablet Take 1 tablet (325 mg total) by mouth daily with breakfast.   fexofenadine (ALLEGRA) 60 MG tablet Take  60 mg by mouth daily.   gabapentin (NEURONTIN) 300 MG capsule Take 300 mg by mouth 3 (three) times daily.   insulin aspart (NOVOLOG FLEXPEN) 100 UNIT/ML FlexPen Inject 5 Units into the skin 3 (three) times daily with meals. 8 am, 12 pm, and 6 pm   Insulin Pen Needle 30G X 5 MM MISC 1 Device by Does not apply route daily. 3/16"  ipratropium-albuterol (DUONEB) 0.5-2.5 (3) MG/3ML SOLN Take 3 mLs by nebulization every 6 (six) hours as needed (wheezing).   LANTUS SOLOSTAR 100 UNIT/ML Solostar Pen Inject 25 Units into the skin at bedtime.   levETIRAcetam (KEPPRA) 1000 MG tablet Take 1 tablet (1,000 mg total) by mouth 2 (two) times daily.   linaclotide (LINZESS) 72 MCG capsule Take 1 capsule (72 mcg total) by mouth daily before breakfast.   LORazepam (ATIVAN) 2 MG/ML injection Inject 0.5 mLs (1 mg total) into the muscle every 15 (fifteen) minutes as needed.   melatonin 5 MG TABS Take 5 mg by mouth at bedtime.   Multiple Vitamins-Minerals (THEREMS M PO) Take 1 tablet by mouth daily. (multivitamin with folic acid) tablet; 400 mcg;   NON FORMULARY Diet - Regular   omeprazole (PRILOSEC) 40 MG capsule Take 1 capsule (40 mg total) by mouth in the morning and at bedtime.   Phenylephrine-Cocoa Butter 0.25-85.5 % SUPP Place 1 suppository rectally 2 (two) times daily as needed.   polyethylene glycol (MIRALAX / GLYCOLAX) 17 g packet Take 17 g by mouth daily.   rOPINIRole (REQUIP) 1 MG tablet Take 1 mg by mouth at bedtime.   rosuvastatin (CRESTOR) 20 MG tablet Take 20 mg by mouth every evening.   sodium bicarbonate 650 MG tablet Take 1 tablet (650 mg total) by mouth 2 (two) times daily.   tiZANidine (ZANAFLEX) 2 MG tablet Take 2 mg by mouth every 6 (six) hours as needed for muscle spasms (for back and sciatic pain).   zinc oxide 20 % ointment Apply 1 Application topically See admin instructions. Apply every shift to bilateral buttocks, sacrum, and coccyx   No facility-administered encounter medications on file as  of 03/31/2023.     SIGNIFICANT DIAGNOSTIC EXAMS  PREVIOUS   04-15-22; dexa: t score -3.391  NO NEW EXAMS    LABS REVIEWED PREVIOUS   04-09-22: liver normal protein 6.0 albumin 3.3; chol 143; ldl 81; trig 71; hdl 48; tsh 3.502  04-27-22; wbc 6.6; hgb 8.7; hct 26.2; mcv 94.6 plt 231; glucose 152; bun 66; creat 1.26; k+ 4.7; na++ 132; ca 8.9; gfr 42; protein 6.7 albumin 3.4 hgb a1c 7.0; vitamin D 37.96 07-09-22: wbc 9.3; hgb 8.5; hct 26.9; mcv 96.8 plt 196; glucose 200; bun 65; creat 2.15; k+ 5.0; na++ 128; ca 7.8; gfr 22 07-10-22: glucose 130; bun 59; creat 1.75; k+ 4.2; na++ 131; ca 7.6; gfr 29 07-30-22: urine micro-albumin 59.7; ACR 127   08-20-22: hgb A1c 8.8 10-01-22: wbc 5.6; hgb 9.6; hct 29.9; mcv 94.9 plt 245; glucose 404; bun 43; creat 1.69; k+ 5.2; na++ 135; ca 8.2 gfr 30 protein 7.2 albumin 3.5 10-19-22: glucose 176; bun 45; creat 1.63; k+ 5.8; na++ 134; ca 8.9; gfr 31 10-31-22: wbc 4.6; hgb 8.4; hct 26.5; mcv 94.0 plt 183; glucose 93; bun 46; creat 1.64; k+ 5.2; na++ 134; ca 8.6; gfr 31; protein 6.5 albumin 3.2  11-05-22: glucose 117; bun 35; creat 1.25; k+ 4.3;na++ 137; ca 7.5; gfr 43; hgb A1c 8.5; vitamin D 26.12; chol 149; ldl 73 trig 166; hdl 43  01-25-23: wbc 4.5; hgb 8.8; hct 27.0; mcv 94.7 plt 178; glucose 163; bun 45; creat 1.58; k+ 5.8; na++ 134; ca 8.7; gfr 32; protein 6.6 albumin 3.4 iron 54; tibc 305; vitamin B12: 496; keppra 80.7 (normal 10-40) 01-26-23: glucose 116; bun 41; creat 1.54; k+ 5.5; na++ 133; ca 8.5 gfr 33   02-08-23: keppra 41.6 (10-40) 02-18-23: hgb A1c 7.5; tsh 5.113 02-19-23: wbc  6.0; hgb 7.9; hct 24.7; mcv 95.7 plt 202; glucose 225; bun 45; creat 1.76; k+ 6.4; na++ 130; ca 8.7; gfr 28; (repeat k+ 5.4) 02-20-23: wbc 6.2; hgb 7.5; hct 24.1; mcv 95.6 plt 186;glucose 195; bun 45; creat 1.76; k+ 5.6; na++ 134; ca 8.5 gfr 28; protein 6.2; albumin 3.1 free t4: 0.80; mag 1.8 02-21-23: vitamin B12 781; folate 26.1; iron 26; tibc 266; ferritin 139 03-23-23: wbc 6.3; hgb 10.0;  hct 31.0; mcv 92.8 plt 186; glucose 239; bun 41; creat 1.35; k+ 4.6; na++ 135; ca 8.7; gfr 39; protein 7.6 albumin 3.6   NO NEW LABS.    Review of Systems  Constitutional:  Negative for malaise/fatigue.  Respiratory:  Negative for cough and shortness of breath.   Cardiovascular:  Negative for chest pain, palpitations and leg swelling.  Gastrointestinal:  Negative for abdominal pain, constipation and heartburn.  Musculoskeletal:  Negative for back pain, joint pain and myalgias.  Skin: Negative.   Neurological:  Negative for dizziness.  Psychiatric/Behavioral:  The patient is not nervous/anxious.    Physical Exam Constitutional:      General: She is not in acute distress.    Appearance: She is well-developed. She is obese. She is not diaphoretic.  Neck:     Thyroid: No thyromegaly.  Cardiovascular:     Rate and Rhythm: Normal rate and regular rhythm.     Pulses: Normal pulses.     Heart sounds: Normal heart sounds.  Pulmonary:     Effort: Pulmonary effort is normal. No respiratory distress.     Breath sounds: Normal breath sounds.  Abdominal:     General: Bowel sounds are normal. There is no distension.     Palpations: Abdomen is soft.     Tenderness: There is no abdominal tenderness.  Genitourinary:    Comments: foley Musculoskeletal:     Cervical back: Neck supple.     Right lower leg: No edema.     Left lower leg: No edema.     Comments: Limited ROM in upper extremities Does not move lower extremities    Lymphadenopathy:     Cervical: No cervical adenopathy.  Skin:    General: Skin is warm and dry.  Neurological:     Mental Status: She is alert and oriented to person, place, and time.  Psychiatric:        Mood and Affect: Mood normal.        ASSESSMENT/ PLAN:  TODAY  Chronic anxiety: is off medications  2. Protein calorie malnutrition severe: albumin 3.6; protein 6.7 will continue supplements as indicated  3. Aortic atherosclerosis (ct 01-26-21) is on asa  and statin   4. Central cord syndrome at C4 level of cervical spine compression subsequent encounter/cord compression quadriplegia C1-4 (01-25-21) asa 81 mg daily; zanaflex 2 mg every 6 hours as needed for spasticity   PREVIOUS   5. Restless leg syndrome: will continue requip 1 mg nightly   6. Hypertension associated with stage 4 chronic kidney disease due to type 2 diabetes mellitus: b/p 122/61 will continue norvasc 5 mg daily   7. Seizure: is stable will continue keppra 1000 mg twice daily  has prn ativan and is followed by neurology  8. Type 2 diabetes mellitus with diabetic neuropathy with long term current use of insulin: hgb A1c 7.5 will continue lantus 25 units nightly novolog 5 unit with meals; farxiga 5 mg daily   9. Hyperlipidemia associated with type 2 diabetes mellitus: ldl 73  continue crestor 20 mg  daily   10. Post menopausal osteoporosis t score -3.391 will continue calcium   11. GERD without esophagitis: will continue prilosec 40 mg twice daily is off reglan is followed by GI  12. Chronic constipation: has BM nearly every day; will continue miralax daily and linzess 72 mcg daily   13. Lumbar radicular syndrome/vascular necrosis bilateral hips: will continue tylenol 650 mg every 8 hours; gabapentin 300 mg three times daily and cymbalta 20 mg daily   14. Diabetic peripheral neuropathy: will continue gabapentin 300 mg three times daily   15. CKD stage 4 due to type 2 diabetes mellitus: bun 35; creat 1.25 gfr 43  16. Anemia associated with type 2 diabetes mellitus due to underlying condition: hgb 8.5 will monitor   17. Urine retention/neurogenic bladder: has long term foley; followed by urology     Synthia Innocent NP [facility]   call [phone]

## 2023-04-01 DIAGNOSIS — R488 Other symbolic dysfunctions: Secondary | ICD-10-CM | POA: Diagnosis not present

## 2023-04-01 DIAGNOSIS — R627 Adult failure to thrive: Secondary | ICD-10-CM | POA: Diagnosis not present

## 2023-04-02 ENCOUNTER — Non-Acute Institutional Stay (SKILLED_NURSING_FACILITY): Payer: Medicare HMO | Admitting: Adult Health

## 2023-04-02 ENCOUNTER — Encounter: Payer: Self-pay | Admitting: Adult Health

## 2023-04-02 DIAGNOSIS — R488 Other symbolic dysfunctions: Secondary | ICD-10-CM | POA: Diagnosis not present

## 2023-04-02 DIAGNOSIS — Z0000 Encounter for general adult medical examination without abnormal findings: Secondary | ICD-10-CM | POA: Diagnosis not present

## 2023-04-02 DIAGNOSIS — R627 Adult failure to thrive: Secondary | ICD-10-CM | POA: Diagnosis not present

## 2023-04-02 NOTE — Progress Notes (Signed)
Subjective:   [name] is a 87 y.o. female who presents for Medicare Annual (Subsequent) preventive examination.  Visit Complete: In person  Patient Medicare AWV questionnaire was completed by the patient on 04-02-23; I have confirmed that all information answered by patient is correct and no changes since this date.  Review of Systems    Review of Systems  Constitutional:  Negative for malaise/fatigue.  Respiratory:  Negative for cough and shortness of breath.   Cardiovascular:  Negative for chest pain, palpitations and leg swelling.  Gastrointestinal:  Negative for abdominal pain, constipation and heartburn.  Musculoskeletal:  Negative for back pain, joint pain and myalgias.  Skin: Negative.   Neurological:  Negative for dizziness.  Psychiatric/Behavioral:  The patient is not nervous/anxious.     Cardiac Risk Factors include: advanced age (>69men, >66 women);diabetes mellitus;dyslipidemia;hypertension;obesity (BMI >30kg/m2);sedentary lifestyle     Objective:    Today's Vitals   04/02/23 1201 04/02/23 1354  BP: 122/61   Pulse: 76   Resp: 18   Temp: 98.7 F (37.1 C)   TempSrc: Temporal   SpO2: 97%   Weight: 178 lb 8 oz (81 kg)   Height: 5\' 2"  (1.575 m)   PainSc:  0-No pain   Body mass index is 32.65 kg/m.     03/24/2023    3:11 PM 03/23/2023    5:03 PM 02/19/2023   12:46 PM 02/17/2023   10:04 AM 01/28/2023   10:54 AM 01/26/2023    2:10 PM 12/28/2022    9:09 AM  Advanced Directives  Does Patient Have a Medical Advance Directive? Yes No Yes Yes Yes Yes Yes  Type of Advance Directive Out of facility DNR (pink MOST or yellow form)  Out of facility DNR (pink MOST or yellow form) Out of facility DNR (pink MOST or yellow form) Out of facility DNR (pink MOST or yellow form) Out of facility DNR (pink MOST or yellow form) Out of facility DNR (pink MOST or yellow form)  Does patient want to make changes to medical advance directive? No - Patient declined  No - Patient  declined No - Patient declined No - Patient declined No - Patient declined No - Patient declined  Would patient like information on creating a medical advance directive?   No - Patient declined      Pre-existing out of facility DNR order (yellow form or pink MOST form) Yellow form placed in chart (order not valid for inpatient use)   Yellow form placed in chart (order not valid for inpatient use) Yellow form placed in chart (order not valid for inpatient use) Yellow form placed in chart (order not valid for inpatient use) Yellow form placed in chart (order not valid for inpatient use)    Current Medications (verified) Outpatient Encounter Medications as of 04/02/2023  Medication Sig   acetaminophen (TYLENOL) 325 MG tablet Take 650 mg by mouth every 8 (eight) hours.   AMBULATORY NON FORMULARY MEDICATION Medication Name:  Continue monthly catheter changes with 12fr catheter at skilled nursing facility.   amLODipine (NORVASC) 5 MG tablet Take 5 mg by mouth daily.   aspirin 81 MG chewable tablet Chew 81 mg by mouth daily.   augmented betamethasone dipropionate (DIPROLENE-AF) 0.05 % ointment Apply 1 Application topically 2 (two) times daily as needed. Apply to stomach and both breast   calcium-vitamin D (OSCAL WITH D) 500-5 MG-MCG tablet Take 1 tablet by mouth 2 (two) times daily.   camphor-menthol (SARNA) lotion Apply 1 Application topically 2 (  two) times daily as needed for itching.   DULoxetine (CYMBALTA) 20 MG capsule Take 20 mg by mouth daily.   feeding supplement (ENSURE ENLIVE / ENSURE PLUS) LIQD Take 237 mLs by mouth daily.   ferrous sulfate 325 (65 FE) MG EC tablet Take 1 tablet (325 mg total) by mouth daily with breakfast.   fexofenadine (ALLEGRA) 60 MG tablet Take 60 mg by mouth daily.   gabapentin (NEURONTIN) 300 MG capsule Take 300 mg by mouth 3 (three) times daily.   insulin aspart (NOVOLOG FLEXPEN) 100 UNIT/ML FlexPen Inject 5 Units into the skin 3 (three) times daily with meals. 8 am,  12 pm, and 6 pm   Insulin Pen Needle 30G X 5 MM MISC 1 Device by Does not apply route daily. 3/16"   ipratropium-albuterol (DUONEB) 0.5-2.5 (3) MG/3ML SOLN Take 3 mLs by nebulization every 6 (six) hours as needed (wheezing).   LANTUS SOLOSTAR 100 UNIT/ML Solostar Pen Inject 25 Units into the skin at bedtime.   levETIRAcetam (KEPPRA) 1000 MG tablet Take 1 tablet (1,000 mg total) by mouth 2 (two) times daily.   linaclotide (LINZESS) 72 MCG capsule Take 1 capsule (72 mcg total) by mouth daily before breakfast.   LORazepam (ATIVAN) 2 MG/ML injection Inject 0.5 mLs (1 mg total) into the muscle every 15 (fifteen) minutes as needed.   melatonin 5 MG TABS Take 5 mg by mouth at bedtime.   Multiple Vitamins-Minerals (THEREMS M PO) Take 1 tablet by mouth daily. (multivitamin with folic acid) tablet; 400 mcg;   NON FORMULARY Diet - Regular   omeprazole (PRILOSEC) 40 MG capsule Take 1 capsule (40 mg total) by mouth in the morning and at bedtime.   Phenylephrine-Cocoa Butter 0.25-85.5 % SUPP Place 1 suppository rectally 2 (two) times daily as needed.   polyethylene glycol (MIRALAX / GLYCOLAX) 17 g packet Take 17 g by mouth daily.   rOPINIRole (REQUIP) 1 MG tablet Take 1 mg by mouth at bedtime.   rosuvastatin (CRESTOR) 20 MG tablet Take 20 mg by mouth every evening.   sodium bicarbonate 650 MG tablet Take 1 tablet (650 mg total) by mouth 2 (two) times daily.   tiZANidine (ZANAFLEX) 2 MG tablet Take 2 mg by mouth every 6 (six) hours as needed for muscle spasms (for back and sciatic pain).   zinc oxide 20 % ointment Apply 1 Application topically See admin instructions. Apply every shift to bilateral buttocks, sacrum, and coccyx   dapagliflozin propanediol (FARXIGA) 5 MG TABS tablet Take 5 mg by mouth daily. (Patient not taking: Reported on 04/02/2023)   No facility-administered encounter medications on file as of 04/02/2023.    Allergies (verified) Ace inhibitors, Codeine, and Sulfa antibiotics   History: Past  Medical History:  Diagnosis Date   Adult failure to thrive    Anemia    Anxiety    Atherosclerosis of aorta (HCC)    Central cord syndrome at C4 level of cervical spinal cord, subsequent encounter (HCC)    CKD (chronic kidney disease)    stage 3   Depression    DM type 2 with diabetic peripheral neuropathy (HCC)    Dysphagia    GERD (gastroesophageal reflux disease)    Gout    High cholesterol    HTN (hypertension)    Hyponatremia    MDD (major depressive disorder)    Neck pain    Neuropathy    Quadriplegia, C1-C4 incomplete (HCC)    Radiculopathy    RLS (restless legs syndrome)  Urinary retention    Past Surgical History:  Procedure Laterality Date   ABDOMINAL HYSTERECTOMY     ANTERIOR CERVICAL DECOMP/DISCECTOMY FUSION N/A 02/05/2021   Procedure: Cervical Three-Four  Anterior cervical decompression/discectomy/fusion;  Surgeon: Coletta Memos, MD;  Location: [facility] OR;  Service: Neurosurgery;  Laterality: N/A;  RM 20   APPENDECTOMY     BACK SURGERY     BALLOON DILATION N/A 11/19/2022   Procedure: BALLOON DILATION;  Surgeon: Lanelle Bal, DO;  Location: [facility];  Service: Endoscopy;  Laterality: N/A;   BIOPSY  09/22/2021   Procedure: BIOPSY;  Surgeon: Lanelle Bal, DO;  Location: [facility];  Service: Endoscopy;;   BIOPSY  11/19/2022   Procedure: BIOPSY;  Surgeon: Lanelle Bal, DO;  Location: [facility];  Service: Endoscopy;;   CERVICAL DISC SURGERY     ESOPHAGOGASTRODUODENOSCOPY (EGD) WITH PROPOFOL N/A 09/22/2021   Procedure: ESOPHAGOGASTRODUODENOSCOPY (EGD) WITH PROPOFOL;  Surgeon: Lanelle Bal, DO;  Location: [facility];  Service: Endoscopy;  Laterality: N/A;  1:00pm   ESOPHAGOGASTRODUODENOSCOPY (EGD) WITH PROPOFOL N/A 11/19/2022   Procedure: ESOPHAGOGASTRODUODENOSCOPY (EGD) WITH PROPOFOL;  Surgeon: Lanelle Bal, DO;  Location: [facility];  Service: Endoscopy;  Laterality: N/A;  11:30 am, asa 3   HAND SURGERY     KNEE SURGERY     Family  History  Problem Relation Age of Onset   Cancer Mother    Cardiomyopathy Father    Seizures Sister        childhood   Seizures Brother        in his 11s   Colon cancer Neg Hx    Social History   Socioeconomic History   Marital status: Widowed    Spouse name: Not on file   Number of children: Not on file   Years of education: Not on file   Highest education level: Not on file  Occupational History   Not on file  Tobacco Use   Smoking status: Never   Smokeless tobacco: Never  Vaping Use   Vaping status: Never Used  Substance and Sexual Activity   Alcohol use: Never   Drug use: Never   Sexual activity: Not Currently  Other Topics Concern   Not on file  Social History Narrative   Lives at [facility]   Social Determinants of Health   Financial Resource Strain: Not on file  Food Insecurity: No Food Insecurity (02/20/2023)   Hunger Vital Sign    Worried About Running Out of Food in the Last Year: Never true    Ran Out of Food in the Last Year: Never true  Transportation Needs: No Transportation Needs (02/20/2023)   PRAPARE - Administrator, Civil Service (Medical): No    Lack of Transportation (Non-Medical): No  Physical Activity: Not on file  Stress: Not on file  Social Connections: Not on file    Tobacco Counseling Counseling given: Not Answered   Clinical Intake:  Pre-visit preparation completed: Yes  Pain : No/denies pain Pain Score: 0-No pain Faces Pain Scale: No hurt  Faces Pain Scale: No hurt  BMI - recorded: 32.65 Nutritional Status: BMI > 30  Obese Diabetes: Yes CBG done?: Yes CBG resulted in Enter/ Edit results?: Yes Did pt. bring in CBG monitor from home?: No  How often do you need to have someone help you when you read instructions, pamphlets, or other written materials from your doctor or pharmacy?: 5 - Always  Interpreter Needed?: No  Activities of Daily Living    04/02/2023    1:49 PM  In your present state  of health, do you have any difficulty performing the following activities:  Hearing? 0  Vision? 0  Difficulty concentrating or making decisions? 1  Walking or climbing stairs? 1  Dressing or bathing? 1  Doing errands, shopping? 1  Preparing Food and eating ? Y  Using the Toilet? Y  In the past six months, have you accidently leaked urine? Y  Do you have problems with loss of bowel control? Y  Managing your Medications? Y  Managing your Finances? Y  Housekeeping or managing your Housekeeping? Y    Patient Care Team: Sharee Holster, NP as PCP - [facility] (Geriatric Medicine) [facility] Facility) Lanelle Bal, DO as Consulting Physician (Gastroenterology)  Indicate any recent Medical Services you may have received from other than [facility] providers in the past year (date may be approximate).     Assessment:   This is a routine wellness examination for [place].  Hearing/Vision screen No results found.  Dietary issues and exercise activities discussed:     Goals Addressed             This Visit's Progress    Absence of Fall and Fall-Related Injury   On track    Evidence-based guidance:  Assess fall risk using a validated tool when available. Consider balance and gait impairment, muscle weakness, diminished vision or hearing, environmental hazards, presence of urinary or bowel urgency and/or incontinence.  Communicate fall injury risk to interprofessional healthcare team.  Develop a fall prevention plan with the patient and family.  Promote use of personal vision and auditory aids.  Promote reorientation, appropriate sensory stimulation, and routines to decrease risk of fall when changes in mental status are present.  Assess assistance level required for safe and effective self-care; consider referral for home care.  Encourage physical activity, such as performance of self-care at highest level of ability, strength and balance exercise program, and  provision of appropriate assistive devices; refer to rehabilitation therapy.  Refer to community-based fall prevention program where available.  If fall occurs, determine the cause and revise fall injury prevention plan.  Regularly review medication contribution to fall risk; consider risk related to polypharmacy and age.  Refer to pharmacist for consultation when concerns about medications are revealed.  Balance adequate pain management with potential for oversedation.  Provide guidance related to environmental modifications.  Consider supplementation with Vitamin D.   Notes:      DIET - INCREASE WATER INTAKE   On track    General - Client will not be readmitted within 30 days ([facility])   On track      Depression Screen    04/02/2023    1:52 PM 12/31/2022   11:16 AM 01/14/2022   12:32 PM 09/19/2021    1:33 PM 09/17/2021   11:19 AM 03/18/2021   12:54 PM 06/06/2018    2:57 PM  PHQ 2/9 Scores  PHQ - 2 Score 2 0 0 1 0 2 0  PHQ- 9 Score 2 0 1 3  4      Fall Risk    04/02/2023    1:50 PM 09/21/2022   11:23 AM 06/19/2022   12:05 PM 03/20/2022    2:02 PM 01/14/2022   12:33 PM  Fall Risk   Falls in the past year? 0 0 0 0 0  Number falls in past yr: 0 0 0 0 0  Injury  with Fall? 0 0 0 0 0  Risk for fall due to : Impaired balance/gait;History of fall(s) History of fall(s) No Fall Risks Impaired balance/gait;Impaired mobility Impaired balance/gait;Impaired mobility  Follow up Falls evaluation completed Falls evaluation completed Falls evaluation completed      MEDICARE RISK AT HOME:  Medicare Risk at Home - 04/02/23 1350     Any stairs in or around the home? Yes    If so, are there any without handrails? No    Home free of loose throw rugs in walkways, pet beds, electrical cords, etc? Yes    Adequate lighting in your home to reduce risk of falls? Yes    Life alert? No    Use of a cane, walker or w/c? Yes    Grab bars in the bathroom? Yes    Shower chair or bench in shower? Yes    Elevated  toilet seat or a handicapped toilet? Yes             TIMED UP AND GO:  Was the test performed?  No    Cognitive Function:    03/20/2022    2:03 PM 03/18/2021   12:59 PM  MMSE - Mini Mental State Exam  Orientation to time 5 5  Orientation to Place 5 5  Registration 3 3  Attention/ Calculation 5 4  Recall 3 3  Language- name 2 objects 2 2  Language- repeat 1 1  Language- follow 3 step command 1 3  Language- read & follow direction 1 1  Write a sentence 0 0  Copy design 0 0  Total score 26 27        04/02/2023    1:52 PM 03/20/2022    2:06 PM 03/18/2021    1:00 PM  6CIT Screen  What Year? 0 points 0 points 0 points  What month? 0 points 0 points 0 points  What time? 0 points 0 points 0 points  Count back from 20 2 points 2 points 2 points  Months in reverse 2 points 2 points 2 points  Repeat phrase 0 points 0 points 2 points  Total Score 4 points 4 points 6 points    Immunizations Immunization History  Administered Date(s) Administered   Covid-19, Mrna,Vaccine(Spikevax)45yrs and older 06/24/2022   Fluad Quad(high Dose 65+) 06/04/2021   Influenza,inj,quad, With Preservative 06/14/2018   Influenza-Unspecified 06/05/2020, 06/02/2022   Moderna Covid-19 Vaccine Bivalent Booster 34yrs & up 06/24/2021   Moderna SARS-COV2 Booster Vaccination 04/16/2021   Moderna Sars-Covid-2 Vaccination 10/26/2019, 11/24/2019, 07/24/2020   Pneumococcal Conjugate-13 06/28/2017   Pneumococcal Polysaccharide-23 09/30/2020   Rsv, Bivalent, Protein Subunit Rsvpref,pf Verdis Frederickson) 08/03/2022   Tdap 01/25/2021   Zoster Recombinant(Shingrix) 03/12/2021, 06/04/2021    TDAP status: Up to date  Flu Vaccine status: Up to date  Pneumococcal vaccine status: Up to date  Covid-19 vaccine status: Completed vaccines  Qualifies for Shingles Vaccine? Yes   Zostavax completed Yes   Shingrix Completed?: No.    Education has been provided regarding the importance of this vaccine. Patient has been  advised to call insurance company to determine out of pocket expense if they have not yet received this vaccine. Advised may also receive vaccine at local pharmacy or Health Dept. Verbalized acceptance and understanding.  Screening Tests Health Maintenance  Topic Date Due   OPHTHALMOLOGY EXAM  Never done   FOOT EXAM  02/24/2023   Medicare Annual Wellness (AWV)  03/20/2023   INFLUENZA VACCINE  04/01/2023   COVID-19 Vaccine (6 -  2023-24 season) 07/13/2023 (Originally 08/19/2022)   Diabetic kidney evaluation - Urine ACR  07/31/2023   HEMOGLOBIN A1C  08/20/2023   Diabetic kidney evaluation - eGFR measurement  03/22/2024   DTaP/Tdap/Td (2 - Td or Tdap) 01/26/2031   Pneumonia Vaccine 19+ Years old  Completed   DEXA SCAN  Completed   Zoster Vaccines- Shingrix  Completed   HPV VACCINES  Aged Out    Health Maintenance  Health Maintenance Due  Topic Date Due   OPHTHALMOLOGY EXAM  Never done   FOOT EXAM  02/24/2023   Medicare Annual Wellness (AWV)  03/20/2023   INFLUENZA VACCINE  04/01/2023    Colorectal cancer screening: No longer required.   Mammogram status: No longer required due to age.  Bone Density status: Completed 8*16/23. Results reflect: Bone density results: OSTEOPOROSIS. Repeat every   years.  Lung Cancer Screening: (Low Dose CT Chest recommended if Age 38-80 years, 20 pack-year currently smoking OR have quit w/in 15years.) does not qualify.   Lung Cancer Screening Referral:   Additional Screening:  Hepatitis C Screening: does not qualify; Completed   Vision Screening: Recommended annual ophthalmology exams for early detection of glaucoma and other disorders of the eye. Is the patient up to date with their annual eye exam?  No  Who is the provider or what is the name of the office in which the patient attends annual eye exams?  If pt is not established with a provider, would they like to be referred to a provider to establish care? No .   Dental Screening:  Recommended annual dental exams for proper oral hygiene  Diabetic Foot Exam: Diabetic Foot Exam: Completed    Community Resource Referral / Chronic Care Management: CRR required this visit?  No   CCM required this visit?  No     Plan:     I have personally reviewed and noted the following in the patient's chart:   Medical and social history Use of alcohol, tobacco or illicit drugs  Current medications and supplements including opioid prescriptions. Patient is not currently taking opioid prescriptions. Functional ability and status Nutritional status Physical activity Advanced directives List of other physicians Hospitalizations, surgeries, and ER visits in previous 12 months Vitals Screenings to include cognitive, depression, and falls Referrals and appointments  In addition, I have reviewed and discussed with patient certain preventive protocols, quality metrics, and best practice recommendations. A written personalized care plan for preventive services as well as general preventive health recommendations were provided to patient.     Sharee Holster, NP   04/02/2023   After Visit Summary: (MyChart) Due to this being a telephonic visit, the after visit summary with patients personalized plan was offered to patient via MyChart   Nurse Notes: this exam was performed by myself at this facility.

## 2023-04-03 DIAGNOSIS — R627 Adult failure to thrive: Secondary | ICD-10-CM | POA: Diagnosis not present

## 2023-04-03 DIAGNOSIS — R488 Other symbolic dysfunctions: Secondary | ICD-10-CM | POA: Diagnosis not present

## 2023-04-05 ENCOUNTER — Other Ambulatory Visit (HOSPITAL_COMMUNITY)
Admission: RE | Admit: 2023-04-05 | Discharge: 2023-04-05 | Disposition: A | Discharge status: Home / Self Care | Payer: Medicare HMO | Source: Skilled Nursing Facility | Attending: Adult Health | Admitting: Adult Health

## 2023-04-05 DIAGNOSIS — R627 Adult failure to thrive: Secondary | ICD-10-CM | POA: Diagnosis not present

## 2023-04-05 DIAGNOSIS — I129 Hypertensive chronic kidney disease with stage 1 through stage 4 chronic kidney disease, or unspecified chronic kidney disease: Secondary | ICD-10-CM | POA: Diagnosis not present

## 2023-04-05 DIAGNOSIS — R488 Other symbolic dysfunctions: Secondary | ICD-10-CM | POA: Diagnosis not present

## 2023-04-05 LAB — BASIC METABOLIC PANEL
Anion gap: 6 (ref 5–15)
BUN: 49 mg/dL — ABNORMAL HIGH (ref 8–23)
CO2: 23 mmol/L (ref 22–32)
Calcium: 8.4 mg/dL — ABNORMAL LOW (ref 8.9–10.3)
Chloride: 105 mmol/L (ref 98–111)
Creatinine, Ser: 1.88 mg/dL — ABNORMAL HIGH (ref 0.44–1.00)
GFR, Estimated: 26 mL/min — ABNORMAL LOW (ref >60)
Glucose, Bld: 215 mg/dL — ABNORMAL HIGH (ref 70–99)
Potassium: 4.3 mmol/L (ref 3.5–5.1)
Sodium: 134 mmol/L — ABNORMAL LOW (ref 135–145)

## 2023-04-06 DIAGNOSIS — R488 Other symbolic dysfunctions: Secondary | ICD-10-CM | POA: Diagnosis not present

## 2023-04-06 DIAGNOSIS — R627 Adult failure to thrive: Secondary | ICD-10-CM | POA: Diagnosis not present

## 2023-04-07 ENCOUNTER — Encounter: Payer: Self-pay | Admitting: Adult Health

## 2023-04-07 ENCOUNTER — Non-Acute Institutional Stay: Payer: Self-pay | Admitting: Adult Health

## 2023-04-07 DIAGNOSIS — E1122 Type 2 diabetes mellitus with diabetic chronic kidney disease: Secondary | ICD-10-CM

## 2023-04-07 DIAGNOSIS — R488 Other symbolic dysfunctions: Secondary | ICD-10-CM | POA: Diagnosis not present

## 2023-04-07 DIAGNOSIS — Z794 Long term (current) use of insulin: Secondary | ICD-10-CM | POA: Diagnosis not present

## 2023-04-07 DIAGNOSIS — R627 Adult failure to thrive: Secondary | ICD-10-CM | POA: Diagnosis not present

## 2023-04-07 DIAGNOSIS — N184 Chronic kidney disease, stage 4 (severe): Secondary | ICD-10-CM

## 2023-04-07 NOTE — Progress Notes (Signed)
Location:  [facility] Room Number: 154 W Place of Service:  SNF (31)   CODE STATUS: DNR  Allergies  Allergen Reactions   Ace Inhibitors Other (See Comments)    Hyperkalemia    Codeine     Unknown reaction   Sulfa Antibiotics     Unknown reaction    Chief Complaint  Patient presents with   Acute Visit    Elevated CBG readings    HPI:  Her cbg readings are elevated. There are no reports of missed medications. Her appetite is good; her weight is stable. She is presently taking farxiga 10 mg daily; lantus 25 units nightly and novolog 5 units with meals.   Past Medical History:  Diagnosis Date   Adult failure to thrive    Anemia    Anxiety    Atherosclerosis of aorta (HCC)    Central cord syndrome at C4 level of cervical spinal cord, subsequent encounter (HCC)    CKD (chronic kidney disease)    stage 3   Depression    DM type 2 with diabetic peripheral neuropathy (HCC)    Dysphagia    GERD (gastroesophageal reflux disease)    Gout    High cholesterol    HTN (hypertension)    Hyponatremia    MDD (major depressive disorder)    Neck pain    Neuropathy    Quadriplegia, C1-C4 incomplete (HCC)    Radiculopathy    RLS (restless legs syndrome)    Urinary retention     Past Surgical History:  Procedure Laterality Date   ABDOMINAL HYSTERECTOMY     ANTERIOR CERVICAL DECOMP/DISCECTOMY FUSION N/A 02/05/2021   Procedure: Cervical Three-Four  Anterior cervical decompression/discectomy/fusion;  Surgeon: Coletta Memos, MD;  Location: [facility] OR;  Service: Neurosurgery;  Laterality: N/A;  RM 20   APPENDECTOMY     BACK SURGERY     BALLOON DILATION N/A 11/19/2022   Procedure: BALLOON DILATION;  Surgeon: Lanelle Bal, DO;  Location: [facility];  Service: Endoscopy;  Laterality: N/A;   BIOPSY  09/22/2021   Procedure: BIOPSY;  Surgeon: Lanelle Bal, DO;  Location: [facility];  Service: Endoscopy;;   BIOPSY  11/19/2022   Procedure: BIOPSY;  Surgeon:  Lanelle Bal, DO;  Location: [facility];  Service: Endoscopy;;   CERVICAL DISC SURGERY     ESOPHAGOGASTRODUODENOSCOPY (EGD) WITH PROPOFOL N/A 09/22/2021   Procedure: ESOPHAGOGASTRODUODENOSCOPY (EGD) WITH PROPOFOL;  Surgeon: Lanelle Bal, DO;  Location: [facility];  Service: Endoscopy;  Laterality: N/A;  1:00pm   ESOPHAGOGASTRODUODENOSCOPY (EGD) WITH PROPOFOL N/A 11/19/2022   Procedure: ESOPHAGOGASTRODUODENOSCOPY (EGD) WITH PROPOFOL;  Surgeon: Lanelle Bal, DO;  Location: [facility];  Service: Endoscopy;  Laterality: N/A;  11:30 am, asa 3   HAND SURGERY     KNEE SURGERY      Social History   Socioeconomic History   Marital status: Widowed    Spouse name: Not on file   Number of children: Not on file   Years of education: Not on file   Highest education level: Not on file  Occupational History   Not on file  Tobacco Use   Smoking status: Never   Smokeless tobacco: Never  Vaping Use   Vaping status: Never Used  Substance and Sexual Activity   Alcohol use: Never   Drug use: Never   Sexual activity: Not Currently  Other Topics Concern   Not on file  Social History Narrative   Lives  at [facility]   Social Determinants of Health   Financial Resource Strain: Not on file  Food Insecurity: No Food Insecurity (02/20/2023)   Hunger Vital Sign    Worried About Running Out of Food in the Last Year: Never true    Ran Out of Food in the Last Year: Never true  Transportation Needs: No Transportation Needs (02/20/2023)   PRAPARE - Administrator, Civil Service (Medical): No    Lack of Transportation (Non-Medical): No  Physical Activity: Not on file  Stress: Not on file  Social Connections: Not on file  Intimate Partner Violence: Not At Risk (02/20/2023)   Humiliation, Afraid, Rape, and Kick questionnaire    Fear of Current or Ex-Partner: No    Emotionally Abused: No    Physically Abused: No    Sexually Abused: No   Family History  Problem  Relation Age of Onset   Cancer Mother    Cardiomyopathy Father    Seizures Sister        childhood   Seizures Brother        in his 30s   Colon cancer Neg Hx       VITAL SIGNS BP (!) 142/72   Pulse 79   Temp (!) 97.3 F (36.3 C)   Ht 5\' 2"  (1.575 m)   Wt 182 lb (82.6 kg)   SpO2 97%   BMI 33.29 kg/m   Outpatient Encounter Medications as of 04/07/2023  Medication Sig   acetaminophen (TYLENOL) 325 MG tablet Take 650 mg by mouth 3 (three) times daily.   AMBULATORY NON FORMULARY MEDICATION Medication Name:  Continue monthly catheter changes with 53fr catheter at skilled nursing facility.   amLODipine (NORVASC) 5 MG tablet Take 5 mg by mouth daily.   apixaban (ELIQUIS) 5 MG TABS tablet Take 5 mg by mouth 2 (two) times daily.   augmented betamethasone dipropionate (DIPROLENE-AF) 0.05 % ointment Apply 1 Application topically 2 (two) times daily as needed. Apply to stomach and both breast   calcium-vitamin D (OSCAL WITH D) 500-5 MG-MCG tablet Take 1 tablet by mouth 2 (two) times daily.   camphor-menthol (SARNA) lotion Apply 1 Application topically 2 (two) times daily as needed for itching.   dapagliflozin propanediol (FARXIGA) 10 MG TABS tablet Take 10 mg by mouth daily.   DULoxetine (CYMBALTA) 20 MG capsule Take 20 mg by mouth daily.   feeding supplement (ENSURE ENLIVE / ENSURE PLUS) LIQD Take 237 mLs by mouth daily.   ferrous sulfate 325 (65 FE) MG EC tablet Take 1 tablet (325 mg total) by mouth daily with breakfast.   fexofenadine (ALLEGRA) 60 MG tablet Take 60 mg by mouth daily.   gabapentin (NEURONTIN) 300 MG capsule Take 300 mg by mouth 3 (three) times daily.   insulin aspart (NOVOLOG FLEXPEN) 100 UNIT/ML FlexPen Inject 5 Units into the skin 3 (three) times daily with meals. 8 am, 12 pm, and 6 pm   Insulin Pen Needle 30G X 5 MM MISC 1 Device by Does not apply route daily. 3/16"   ipratropium-albuterol (DUONEB) 0.5-2.5 (3) MG/3ML SOLN Take 3 mLs by nebulization every 6 (six) hours  as needed (wheezing).   LANTUS SOLOSTAR 100 UNIT/ML Solostar Pen Inject 30 Units into the skin at bedtime.   levETIRAcetam (KEPPRA) 1000 MG tablet Take 1 tablet (1,000 mg total) by mouth 2 (two) times daily.   linaclotide (LINZESS) 145 MCG CAPS capsule Take 145 mcg by mouth daily as needed.   LORazepam (ATIVAN)  2 MG/ML injection Inject 0.5 mLs (1 mg total) into the muscle every 15 (fifteen) minutes as needed.   melatonin 5 MG TABS Take 5 mg by mouth at bedtime.   Multiple Vitamins-Minerals (THEREMS M PO) Take 1 tablet by mouth daily. (multivitamin with folic acid) tablet; 400 mcg;   NON FORMULARY Diet - Regular   omeprazole (PRILOSEC) 40 MG capsule Take 1 capsule (40 mg total) by mouth in the morning and at bedtime.   Phenylephrine-Cocoa Butter 0.25-85.5 % SUPP Place 1 suppository rectally 2 (two) times daily as needed.   rOPINIRole (REQUIP) 1 MG tablet Take 1 mg by mouth at bedtime.   rosuvastatin (CRESTOR) 20 MG tablet Take 20 mg by mouth every evening.   sodium bicarbonate 650 MG tablet Take 1 tablet (650 mg total) by mouth 2 (two) times daily.   tiZANidine (ZANAFLEX) 2 MG tablet Take 2 mg by mouth every 6 (six) hours as needed for muscle spasms (for back and sciatic pain).   zinc oxide 20 % ointment Apply 1 Application topically See admin instructions. Apply every shift to bilateral buttocks, sacrum, and coccyx   [DISCONTINUED] aspirin 81 MG chewable tablet Chew 81 mg by mouth daily.   [DISCONTINUED] dapagliflozin propanediol (FARXIGA) 5 MG TABS tablet Take 5 mg by mouth daily. (Patient not taking: Reported on 04/02/2023)   [DISCONTINUED] linaclotide (LINZESS) 72 MCG capsule Take 1 capsule (72 mcg total) by mouth daily before breakfast.   [DISCONTINUED] polyethylene glycol (MIRALAX / GLYCOLAX) 17 g packet Take 17 g by mouth daily.   No facility-administered encounter medications on file as of 04/07/2023.     SIGNIFICANT DIAGNOSTIC EXAMS  PREVIOUS   04-15-22; dexa: t score -3.391  NO NEW  EXAMS    LABS REVIEWED PREVIOUS   04-09-22: liver normal protein 6.0 albumin 3.3; chol 143; ldl 81; trig 71; hdl 48; tsh 3.502  04-27-22; wbc 6.6; hgb 8.7; hct 26.2; mcv 94.6 plt 231; glucose 152; bun 66; creat 1.26; k+ 4.7; na++ 132; ca 8.9; gfr 42; protein 6.7 albumin 3.4 hgb a1c 7.0; vitamin D 37.96 07-09-22: wbc 9.3; hgb 8.5; hct 26.9; mcv 96.8 plt 196; glucose 200; bun 65; creat 2.15; k+ 5.0; na++ 128; ca 7.8; gfr 22 07-10-22: glucose 130; bun 59; creat 1.75; k+ 4.2; na++ 131; ca 7.6; gfr 29 07-30-22: urine micro-albumin 59.7; ACR 127   08-20-22: hgb A1c 8.8 10-01-22: wbc 5.6; hgb 9.6; hct 29.9; mcv 94.9 plt 245; glucose 404; bun 43; creat 1.69; k+ 5.2; na++ 135; ca 8.2 gfr 30 protein 7.2 albumin 3.5 10-19-22: glucose 176; bun 45; creat 1.63; k+ 5.8; na++ 134; ca 8.9; gfr 31 10-31-22: wbc 4.6; hgb 8.4; hct 26.5; mcv 94.0 plt 183; glucose 93; bun 46; creat 1.64; k+ 5.2; na++ 134; ca 8.6; gfr 31; protein 6.5 albumin 3.2  11-05-22: glucose 117; bun 35; creat 1.25; k+ 4.3;na++ 137; ca 7.5; gfr 43; hgb A1c 8.5; vitamin D 26.12; chol 149; ldl 73 trig 166; hdl 43  01-25-23: wbc 4.5; hgb 8.8; hct 27.0; mcv 94.7 plt 178; glucose 163; bun 45; creat 1.58; k+ 5.8; na++ 134; ca 8.7; gfr 32; protein 6.6 albumin 3.4 iron 54; tibc 305; vitamin B12: 496; keppra 80.7 (normal 10-40) 01-26-23: glucose 116; bun 41; creat 1.54; k+ 5.5; na++ 133; ca 8.5 gfr 33   02-08-23: keppra 41.6 (10-40) 02-18-23: hgb A1c 7.5; tsh 5.113 02-19-23: wbc 6.0; hgb 7.9; hct 24.7; mcv 95.7 plt 202; glucose 225; bun 45; creat 1.76; k+ 6.4; na++ 130;  ca 8.7; gfr 28; (repeat k+ 5.4) 02-20-23: wbc 6.2; hgb 7.5; hct 24.1; mcv 95.6 plt 186;glucose 195; bun 45; creat 1.76; k+ 5.6; na++ 134; ca 8.5 gfr 28; protein 6.2; albumin 3.1 free t4: 0.80; mag 1.8 02-21-23: vitamin B12 781; folate 26.1; iron 26; tibc 266; ferritin 139 03-23-23: wbc 6.3; hgb 10.0; hct 31.0; mcv 92.8 plt 186; glucose 239; bun 41; creat 1.35; k+ 4.6; na++ 135; ca 8.7; gfr 39; protein 7.6  albumin 3.6   NO NEW LABS.    Review of Systems  Constitutional:  Negative for malaise/fatigue.  Respiratory:  Negative for cough and shortness of breath.   Cardiovascular:  Negative for chest pain, palpitations and leg swelling.  Gastrointestinal:  Negative for abdominal pain, constipation and heartburn.  Musculoskeletal:  Negative for back pain, joint pain and myalgias.  Skin: Negative.   Neurological:  Negative for dizziness.  Psychiatric/Behavioral:  The patient is not nervous/anxious.    Physical Exam Constitutional:      General: She is not in acute distress.    Appearance: She is well-developed. She is obese. She is not diaphoretic.  Neck:     Thyroid: No thyromegaly.  Cardiovascular:     Rate and Rhythm: Normal rate and regular rhythm.     Pulses: Normal pulses.     Heart sounds: Normal heart sounds.  Pulmonary:     Effort: Pulmonary effort is normal. No respiratory distress.     Breath sounds: Normal breath sounds.  Abdominal:     General: Bowel sounds are normal. There is no distension.     Palpations: Abdomen is soft.     Tenderness: There is no abdominal tenderness.  Musculoskeletal:     Cervical back: Neck supple.     Right lower leg: No edema.     Left lower leg: No edema.     Comments: Limited range of motion in upper extremities Does not move lower extremities  Lymphadenopathy:     Cervical: No cervical adenopathy.  Skin:    General: Skin is warm and dry.  Neurological:     Mental Status: She is alert and oriented to person, place, and time.  Psychiatric:        Mood and Affect: Mood normal.      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

## 2023-04-08 DIAGNOSIS — R488 Other symbolic dysfunctions: Secondary | ICD-10-CM | POA: Diagnosis not present

## 2023-04-08 DIAGNOSIS — R627 Adult failure to thrive: Secondary | ICD-10-CM | POA: Diagnosis not present

## 2023-04-15 ENCOUNTER — Encounter: Payer: Self-pay | Admitting: Adult Health

## 2023-04-15 ENCOUNTER — Ambulatory Visit (INDEPENDENT_AMBULATORY_CARE_PROVIDER_SITE_OTHER): Payer: Medicare HMO | Admitting: Adult Health

## 2023-04-15 VITALS — BP 140/61 | HR 85 | Ht 62.0 in | Wt 182.0 lb

## 2023-04-15 DIAGNOSIS — G40909 Epilepsy, unspecified, not intractable, without status epilepticus: Secondary | ICD-10-CM

## 2023-04-15 MED ORDER — LAMOTRIGINE 25 MG PO TABS: ORAL_TABLET | ORAL | 11 refills | Status: DC

## 2023-04-15 NOTE — Patient Instructions (Addendum)
Recommend starting lamotrigine - will start at 25mg  nightly for 7 days then increase to 50mg  nightly  Continue keppra 1000mg  twice daily - no need to routinely monitor levels or adjust dosage based on level  If seizures persist, please let me know for further dosage adjustment. May also consider completing a 72 hour EEG to further evaluate episodes   Please follow up regarding pain on left side - patient unsure when last bowel movement was, unsure if related, possibly consider use of laxative/stool softener      Follow up in 4 months or call earlier if needed

## 2023-04-15 NOTE — Progress Notes (Signed)
[facility] [address] [place]. [place] [address] [phone]       OFFICE FOLLOW UP NOTE  [name] Date of Birth:  22-Dec-1935 Medical Record Number:  [id number]    Primary neurologist: Dr. Frances Furbish Reason for visit: Seizure disorder    SUBJECTIVE:   HPI:   [name] is a 87 y.o. female who is being followed in this office by Dr. Frances Furbish for seizure disorder since her teenage years.  She is a permanent resident of [facility]F with diagnoses of adult failure to thrive, chronic anemia, arthrosclerosis, cervical cord syndrome with quadriplegia, DM with stage III CKD, HLD, HTN and depression    Update 04/15/2023 JM: Patient returns for seizure follow-up accompanied by her granddaughter.  At prior visit, lamotrigine discontinued per patient request and continued on Keppra 1000/1500.   SNF noted seizure in May, lab work showed Keppra level 80.7, Keppra lowered to 750mg  twice daily Recurrent seizure in July during Foley exchange at urology visit, evaluated in ED, [facility] no acute abnormality.  Increased Keppra to 1000 mg twice daily.   Granddaughter reports she has been seen in the ED multiple times since prior visit for recurrent seizures but only able to verify being seen in ED for seizure in July. Also reports recurrent seizure like activity over the past several months when patient sitting up in chair, usually for at least 20-30 min, will black out and become unresponsive. Can be associated at times with extremity jerking or shaking.  She does report this can happen daily, sometime even multiple times per day. Does report left sided mid abdominal pain more recently, granddaughter unsure if associated.  Patient unsure when last bowel movement occurred, unsure if related to abdominal pain.  She is prescribed Linzess PRN.  She has also had issues with recurrent UTIs, urology wanting to complete cystoscopy for further evaluation but family wished to  wait until seizures under control.      History provided for reference purposes only Update 07/16/2022 JM: Patient returns for seizure follow-up visit after prior visit 7 months ago accompanied by her daughter, [name].  She continues to reside at [facility] nursing [facility] SNF.  Denies any seizure activity since prior visit.  Remains on Keppra 1000/1500 and lamotrigine 25 mg daily.  She is very eager to try to come off of some of her medications as she feels she takes too many.  Routine lab work at [facility].  No new concerns at this time.  Update 01/07/2022 JM: Returns for 73-month follow-up visit accompanied by her daughter [name].  She continues to reside at [facility]. Per daughter, has had at least 4 seizures since prior visit.  Per EMR review, SNF noted seizure activity on 2/25 requiring dose of IM Ativan.  No specific triggers identified. SNF continued Keppra 1000 mg AM and 1500 mg PM and increased lamotrigine to 25 mg daily (previously 12.5 mg daily).  Per daughter, additional seizures since that time with most recent last month.  Per SNF med list, she has continued on lamotrigine 25 mg daily and Keppra 1000/1500, denies side effects. She does not wish to further make adjust to any medications as she feels as though she is already taking too many. Completed EEG 2/16 with no epileptiform discharges seen or any other abnormalities.  No further concerns at this time.   History from Dr. Frances Furbish 2 medications consult visit on 10/07/2021 copied for reference purposes only [name] is an 87 year old  right-handed woman with an underlying complex medical history of chronic kidney disease, central cord syndrome diabetes, gout, reflux disease, hypertension, hyperlipidemia, hyponatremia, depression, anemia, overweight state, anxiety, status post multiple surgeries including neck surgery, low back surgery, knee surgery, hand surgery, hysterectomy, appendectomy, who reports a longstanding history of seizures.   She also reports a family history of seizures affecting her sister who had seizures starting at age 91 years old.  Patient reports that she was on Dilantin in the past.  She had recurrence of seizures approximately last year as I understand. I reviewed your office note from 07/28/2021, at which time she saw Benita Gutter, NP.  Of note, she is on multiple medications including lorazepam as needed, atorvastatin, Biotene as needed, chlorthalidone, colchicine, Cymbalta, gabapentin, Humalog, Lantus, melatonin, mirtazapine, omeprazole, Reglan, ropinirole, tizanidine as needed.  She is on Ventolin as needed. Per daughter, she does not have any tonic-clonic seizures, her seizures consist of involuntary mouth movements, decreased consciousness, or loss of awareness, lipsmacking, and drawing of her arms upwards above her head. She is wheelchair-bound and since last year has not been able to walk.  She has an indwelling catheter.  Per daughter, she hydrates well with water.  She has limited caffeine, 1 cup of coffee in the mornings typically.  She does not drink any alcohol.  She resides at [facility], skilled nursing facility.   I had evaluated her over 2 years ago at the request of Dr. Jenne Pane in ENT for her balance problems and gait disorder.  She has since then missed 2 appointments.  She has been seen by Dr. Gerilyn Pilgrim in the interim in 2021 but office notes were not available for my review.  She was recently seen in the emergency room on 09/28/2021 with seizures.  She presented from the [facility].  She received multiple doses of Ativan.  I reviewed the emergency room records.  She had a head CT without contrast on 09/28/2021 and I reviewed the results: Impression: No acute intracranial findings are seen in noncontrast CT brain.  Chronic sinusitis.  Bilateral mastoid effusions.   She had a head CT without contrast on 09/14/2021 with the indication of seizure today, altered mental status.  I reviewed the results:  Impression: No acute intracranial abnormality.  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  New left sphenoid sinus air-fluid level, which may be seen with acute sinusitis.  She had a brain MRI with and without contrast on 07/07/2021 with indication of possible seizure.  Abnormal neurological exam.  I reviewed the results: Impression: No acute intracranial finding. No sign of acute infarction. No lesion seen to explain seizure. The patient does have mild chronic small-vessel ischemic changes considering age affecting the pons, thalami and cerebral hemispheric white matter. Left mastoid effusion. She has been on Keppra and Lamictal.  The Lamictal was started in late January 2023 by her primary care.  In the emergency room on 09/28/2021 she was given IV Keppra 1500 mg x 1.  She was advised to continue with Keppra and Lamictal, Keppra was increased to 1000 mg in the morning and 1500 mg in the evening.  Urinalysis was nonsuspicious for a UTI.  Laboratory evaluation showed a magnesium level of 1.8, sodium mildly low at 133, potassium 5.1, glucose 160, BUN 57, creatinine 1.33, CBC showed WBC of 6.2, hemoglobin was low at 9, hematocrit low at 27.9.  Repeat blood sugar 148. She missed interim appointments on appointment on 01/02/20 and 01/30/20.   Previously:    08/17/19: (She)  reports problems with her balance for the past 2 years.  She reports that she was diagnosed with diabetes in the 70s and she started noticing occasional problems with walking straight, she would at times severe in one direction.  In the recent past she has had 2 falls.  She was in the home both times.  She fell backwards recently and hit her head against the corner of the furnace she says.  She had bent down to pick something up and when she stood up she overshot.  This happened on 07/02/2019.  On 1123, she fell in the bathroom after standing up from the toilet commode.  She does have occasional lightheadedness when standing up quickly.  She has a 4  pronged cane available and a walker from when she had back and neck surgeries but has not been using either.  She hydrates well with water, estimates that she drinks 3-4 bottles of water per day and up to 2 bottles per night.  She lives alone, she is widowed, she has  4 grown children.  She has a call alert button.  She is a non-smoker and does not drink alcohol and does not drink caffeine on a daily basis.  Her latest A1c was either 8 or 9 or 11 per her recollection.  She had blood work through her primary care physician in October.  I reviewed your office note from 07/06/2019.  She denies any recurrent headaches or vertiginous symptoms such as spinning sensation or nausea.  She denies any sudden onset of one-sided weakness or numbness or tingling or droopy face or slurring of speech.  She has had intermittent tingling sensation in her feet and left hand.       ROS:   14 system review of systems performed and negative with exception of those listed in HPI  PMH:  Past Medical History:  Diagnosis Date   Adult failure to thrive    Anemia    Anxiety    Atherosclerosis of aorta (HCC)    Central cord syndrome at C4 level of cervical spinal cord, subsequent encounter (HCC)    CKD (chronic kidney disease)    stage 3   Depression    DM type 2 with diabetic peripheral neuropathy (HCC)    Dysphagia    GERD (gastroesophageal reflux disease)    Gout    High cholesterol    HTN (hypertension)    Hyponatremia    MDD (major depressive disorder)    Neck pain    Neuropathy    Quadriplegia, C1-C4 incomplete (HCC)    Radiculopathy    RLS (restless legs syndrome)    Urinary retention     PSH:  Past Surgical History:  Procedure Laterality Date   ABDOMINAL HYSTERECTOMY     ANTERIOR CERVICAL DECOMP/DISCECTOMY FUSION N/A 02/05/2021   Procedure: Cervical Three-Four  Anterior cervical decompression/discectomy/fusion;  Surgeon: Coletta Memos, MD;  Location: [facility] OR;  Service: Neurosurgery;  Laterality: N/A;   RM 20   APPENDECTOMY     BACK SURGERY     BALLOON DILATION N/A 11/19/2022   Procedure: BALLOON DILATION;  Surgeon: Lanelle Bal, DO;  Location: [facility];  Service: Endoscopy;  Laterality: N/A;   BIOPSY  09/22/2021   Procedure: BIOPSY;  Surgeon: Lanelle Bal, DO;  Location: [facility];  Service: Endoscopy;;   BIOPSY  11/19/2022   Procedure: BIOPSY;  Surgeon: Lanelle Bal, DO;  Location: [facility];  Service: Endoscopy;;   CERVICAL DISC SURGERY  ESOPHAGOGASTRODUODENOSCOPY (EGD) WITH PROPOFOL N/A 09/22/2021   Procedure: ESOPHAGOGASTRODUODENOSCOPY (EGD) WITH PROPOFOL;  Surgeon: Lanelle Bal, DO;  Location: [facility];  Service: Endoscopy;  Laterality: N/A;  1:00pm   ESOPHAGOGASTRODUODENOSCOPY (EGD) WITH PROPOFOL N/A 11/19/2022   Procedure: ESOPHAGOGASTRODUODENOSCOPY (EGD) WITH PROPOFOL;  Surgeon: Lanelle Bal, DO;  Location: [facility];  Service: Endoscopy;  Laterality: N/A;  11:30 am, asa 3   HAND SURGERY     KNEE SURGERY      Social History:  Social History   Socioeconomic History   Marital status: Widowed    Spouse name: Not on file   Number of children: Not on file   Years of education: Not on file   Highest education level: Not on file  Occupational History   Not on file  Tobacco Use   Smoking status: Never   Smokeless tobacco: Never  Vaping Use   Vaping status: Never Used  Substance and Sexual Activity   Alcohol use: Never   Drug use: Never   Sexual activity: Not Currently  Other Topics Concern   Not on file  Social History Narrative   Lives at [facility]   Social Determinants of Health   Financial Resource Strain: Not on file  Food Insecurity: No Food Insecurity (02/20/2023)   Hunger Vital Sign    Worried About Running Out of Food in the Last Year: Never true    Ran Out of Food in the Last Year: Never true  Transportation Needs: No Transportation Needs (02/20/2023)   PRAPARE - Scientist, research (physical sciences) (Medical): No    Lack of Transportation (Non-Medical): No  Physical Activity: Not on file  Stress: Not on file  Social Connections: Not on file  Intimate Partner Violence: Not At Risk (02/20/2023)   Humiliation, Afraid, Rape, and Kick questionnaire    Fear of Current or Ex-Partner: No    Emotionally Abused: No    Physically Abused: No    Sexually Abused: No    Family History:  Family History  Problem Relation Age of Onset   Cancer Mother    Cardiomyopathy Father    Seizures Sister        childhood   Seizures Brother        in his 30s   Colon cancer Neg Hx     Medications:   Current Outpatient Medications on File Prior to Visit  Medication Sig Dispense Refill   acetaminophen (TYLENOL) 325 MG tablet Take 650 mg by mouth 3 (three) times daily.     AMBULATORY NON FORMULARY MEDICATION Medication Name:  Continue monthly catheter changes with 14fr catheter at skilled nursing facility. 1 application MONTHLY   amLODipine (NORVASC) 5 MG tablet Take 5 mg by mouth daily.     apixaban (ELIQUIS) 5 MG TABS tablet Take 5 mg by mouth 2 (two) times daily.     augmented betamethasone dipropionate (DIPROLENE-AF) 0.05 % ointment Apply 1 Application topically 2 (two) times daily as needed. Apply to stomach and both breast     calcium-vitamin D (OSCAL WITH D) 500-5 MG-MCG tablet Take 1 tablet by mouth 2 (two) times daily.     camphor-menthol (SARNA) lotion Apply 1 Application topically 2 (two) times daily as needed for itching.     dapagliflozin propanediol (FARXIGA) 10 MG TABS tablet Take 10 mg by mouth daily.     DULoxetine (CYMBALTA) 20 MG capsule Take 20 mg by mouth daily.     feeding supplement (ENSURE ENLIVE /  ENSURE PLUS) LIQD Take 237 mLs by mouth daily.     ferrous sulfate 325 (65 FE) MG EC tablet Take 1 tablet (325 mg total) by mouth daily with breakfast. 60 tablet 3   fexofenadine (ALLEGRA) 60 MG tablet Take 60 mg by mouth daily.     gabapentin (NEURONTIN) 300 MG capsule  Take 300 mg by mouth 3 (three) times daily.     insulin aspart (NOVOLOG FLEXPEN) 100 UNIT/ML FlexPen Inject 5 Units into the skin 3 (three) times daily with meals. 8 am, 12 pm, and 6 pm     Insulin Pen Needle 30G X 5 MM MISC 1 Device by Does not apply route daily. 3/16"  0   ipratropium-albuterol (DUONEB) 0.5-2.5 (3) MG/3ML SOLN Take 3 mLs by nebulization every 6 (six) hours as needed (wheezing).     LANTUS SOLOSTAR 100 UNIT/ML Solostar Pen Inject 30 Units into the skin at bedtime.     levETIRAcetam (KEPPRA) 1000 MG tablet Take 1 tablet (1,000 mg total) by mouth 2 (two) times daily. 60 tablet 0   linaclotide (LINZESS) 145 MCG CAPS capsule Take 145 mcg by mouth daily as needed.     LORazepam (ATIVAN) 2 MG/ML injection Inject 0.5 mLs (1 mg total) into the muscle every 15 (fifteen) minutes as needed. 4 mL 0   melatonin 5 MG TABS Take 5 mg by mouth at bedtime.     Multiple Vitamins-Minerals (THEREMS M PO) Take 1 tablet by mouth daily. (multivitamin with folic acid) tablet; 400 mcg;     NON FORMULARY Diet - Regular     omeprazole (PRILOSEC) 40 MG capsule Take 1 capsule (40 mg total) by mouth in the morning and at bedtime. 90 capsule 3   Phenylephrine-Cocoa Butter 0.25-85.5 % SUPP Place 1 suppository rectally 2 (two) times daily as needed.     rOPINIRole (REQUIP) 1 MG tablet Take 1 mg by mouth at bedtime.     rosuvastatin (CRESTOR) 20 MG tablet Take 20 mg by mouth every evening.     sodium bicarbonate 650 MG tablet Take 1 tablet (650 mg total) by mouth 2 (two) times daily.     tiZANidine (ZANAFLEX) 2 MG tablet Take 2 mg by mouth every 6 (six) hours as needed for muscle spasms (for back and sciatic pain).     zinc oxide 20 % ointment Apply 1 Application topically See admin instructions. Apply every shift to bilateral buttocks, sacrum, and coccyx     No current facility-administered medications on file prior to visit.    Allergies:   Allergies  Allergen Reactions   Ace Inhibitors Other (See  Comments)    Hyperkalemia    Codeine     Unknown reaction   Sulfa Antibiotics     Unknown reaction      OBJECTIVE:  Physical Exam  Vitals:   04/15/23 1437  BP: (!) 140/61  Pulse: 85  Weight: 182 lb (82.6 kg)  Height: 5\' 2"  (1.575 m)   Body mass index is 33.29 kg/m. No results found.  General: well developed, well nourished, very pleasant elderly female, seated, in no evident distress Head: head normocephalic and atraumatic.   Neck: supple with no carotid or supraclavicular bruits Cardiovascular: regular rate and rhythm, no murmurs Skin:  no rash/petichiae Vascular:  Normal pulses all extremities   Neurologic Exam Mental Status: Awake and fully alert. Oriented to place and time. Recent and remote memory intact. Attention span, concentration and fund of knowledge appropriate. Mood and affect appropriate.  Cranial Nerves:  Pupils equal, briskly reactive to light. Extraocular movements full without nystagmus. Visual fields full to confrontation. Hearing intact. Facial sensation intact. Face, tongue, palate moves normally and symmetrically.  Motor: Generalized weakness and muscle atrophy 4/5 BUE and 2/5 BLE Sensory.:  Decreased sensory BLE distally Coordination: Rapid alternating movements slowed. Finger-to-nose performed accurately bilaterally and heel-to-shin unable to perform. Gait and Station: Deferred as patient nonambulatory Reflexes: 1+ and symmetric. Toes downgoing.         ASSESSMENT/PLAN: [name] is a 87 y.o. year old female with.    Seizure disorder:  Recurrent seizure activity since prior visit, also episodes while sitting up in chair. Also has been having recurrent UTIs, urology has been wanting to complete cystoscopy but family wished to hold off on setting of recurrent seizures.  Discussed either increasing Keppra dosage or restarting lamotrigine which was discontinued at prior visit per patient request.  Patient agreed to restarting lamotrigine,  recommend starting at 25 mg daily for 7 days then increase to 50 mg daily. Request to be notified with any additional seizures for further dosage adjustment. Continue Keppra 1000mg  BID -dosage previously reduced back in May to 750mg  BID after seizure by SNF due to keppra level 80 but then increased by ED after seizure activity in July.  No indication to obtain Keppra level nor adjust dosage based on levels If episodes while sitting up in chair persist, can consider 72-hour EEG to further characterize events EEG 10/16/2021 no epileptiform discharges seen  Left mid abdominal pain: Request further evaluation by SNF     Follow up in 4 months or call earlier if needed    CC:  PCP: Sharee Holster, NP    I spent 40 minutes of face-to-face and non-face-to-face time with patient and granddaughter.  This included previsit chart review, lab review, study review,  electronic health record documentation, patient and granddaughter education and discussion regarding above diagnoses and treatment plan and answered all the questions to patient and granddaughter satisfaction   Ihor Austin, [facility]  [facility] [address] [place], [place] [address]  Phone [phone] Fax [phone] Note: This document was prepared with digital dictation and possible smart phrase technology. Any transcriptional errors that result from this process are unintentional.

## 2023-04-26 ENCOUNTER — Encounter: Payer: Self-pay | Admitting: Adult Health

## 2023-04-28 ENCOUNTER — Encounter: Payer: Self-pay | Admitting: Adult Health

## 2023-04-28 ENCOUNTER — Non-Acute Institutional Stay (SKILLED_NURSING_FACILITY): Payer: Medicare HMO | Admitting: Adult Health

## 2023-04-28 DIAGNOSIS — Z794 Long term (current) use of insulin: Secondary | ICD-10-CM | POA: Diagnosis not present

## 2023-04-28 DIAGNOSIS — G2581 Restless legs syndrome: Secondary | ICD-10-CM | POA: Diagnosis not present

## 2023-04-28 DIAGNOSIS — Z66 Do not resuscitate: Secondary | ICD-10-CM

## 2023-04-28 DIAGNOSIS — I129 Hypertensive chronic kidney disease with stage 1 through stage 4 chronic kidney disease, or unspecified chronic kidney disease: Secondary | ICD-10-CM

## 2023-04-28 DIAGNOSIS — E1140 Type 2 diabetes mellitus with diabetic neuropathy, unspecified: Secondary | ICD-10-CM

## 2023-04-28 DIAGNOSIS — E1122 Type 2 diabetes mellitus with diabetic chronic kidney disease: Secondary | ICD-10-CM | POA: Diagnosis not present

## 2023-04-28 DIAGNOSIS — N184 Chronic kidney disease, stage 4 (severe): Secondary | ICD-10-CM | POA: Diagnosis not present

## 2023-04-28 DIAGNOSIS — R569 Unspecified convulsions: Secondary | ICD-10-CM | POA: Diagnosis not present

## 2023-04-28 NOTE — Progress Notes (Unsigned)
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 Her cbg readings remain elevated and will need to increase her insulins. There are no reports of uncontrolled pain. She does get out of bed nearly every day.   Past Medical History:  Diagnosis Date   Adult failure to thrive    Anemia    Anxiety    Atherosclerosis of aorta (HCC)    Central cord syndrome at C4 level of cervical spinal cord, subsequent encounter (HCC)    CKD (chronic kidney disease)    stage 3   Depression    DM type 2 with diabetic peripheral neuropathy (HCC)    Dysphagia    GERD (gastroesophageal reflux disease)    Gout    High cholesterol    HTN (hypertension)    Hyponatremia    MDD (major depressive disorder)    Neck pain    Neuropathy    Quadriplegia, C1-C4 incomplete (HCC)    Radiculopathy    RLS (restless legs syndrome)    Urinary retention     Past Surgical History:  Procedure Laterality Date   ABDOMINAL HYSTERECTOMY     ANTERIOR CERVICAL DECOMP/DISCECTOMY FUSION N/A 02/05/2021   Procedure: Cervical Three-Four  Anterior cervical  decompression/discectomy/fusion;  Surgeon: Coletta Memos, MD;  Location: [facility] OR;  Service: Neurosurgery;  Laterality: N/A;  RM 20   APPENDECTOMY     BACK SURGERY     BALLOON DILATION N/A 11/19/2022   Procedure: BALLOON DILATION;  Surgeon: Lanelle Bal, DO;  Location: [facility];  Service: Endoscopy;  Laterality: N/A;   BIOPSY  09/22/2021   Procedure: BIOPSY;  Surgeon: Lanelle Bal, DO;  Location: [facility];  Service: Endoscopy;;   BIOPSY  11/19/2022   Procedure: BIOPSY;  Surgeon: Lanelle Bal, DO;  Location: [facility];  Service: Endoscopy;;   CERVICAL DISC SURGERY     ESOPHAGOGASTRODUODENOSCOPY (EGD) WITH PROPOFOL N/A 09/22/2021   Procedure: ESOPHAGOGASTRODUODENOSCOPY (EGD) WITH PROPOFOL;  Surgeon: Lanelle Bal, DO;  Location: [facility];  Service: Endoscopy;  Laterality: N/A;  1:00pm   ESOPHAGOGASTRODUODENOSCOPY (EGD) WITH PROPOFOL N/A 11/19/2022   Procedure: ESOPHAGOGASTRODUODENOSCOPY (EGD) WITH PROPOFOL;  Surgeon: Lanelle Bal, DO;  Location: [facility];  Service: Endoscopy;  Laterality: N/A;  11:30 am, asa 3   HAND SURGERY     KNEE SURGERY  Social History   Socioeconomic History   Marital status: Widowed    Spouse name: Not on file   Number of children: Not on file   Years of education: Not on file   Highest education level: Not on file  Occupational History   Not on file  Tobacco Use   Smoking status: Never   Smokeless tobacco: Never  Vaping Use   Vaping status: Never Used  Substance and Sexual Activity   Alcohol use: Never   Drug use: Never   Sexual activity: Not Currently  Other Topics Concern   Not on file  Social History Narrative   Lives at [facility]   Social Determinants of Health   Financial Resource Strain: Not on file  Food Insecurity: No Food Insecurity (02/20/2023)   Hunger Vital Sign    Worried About Running Out of Food in the Last Year: Never true    Ran Out of Food in the Last Year: Never true   Transportation Needs: No Transportation Needs (02/20/2023)   PRAPARE - Administrator, Civil Service (Medical): No    Lack of Transportation (Non-Medical): No  Physical Activity: Not on file  Stress: Not on file  Social Connections: Not on file  Intimate Partner Violence: Not At Risk (02/20/2023)   Humiliation, Afraid, Rape, and Kick questionnaire    Fear of Current or Ex-Partner: No    Emotionally Abused: No    Physically Abused: No    Sexually Abused: No   Family History  Problem Relation Age of Onset   Cancer Mother    Cardiomyopathy Father    Seizures Sister        childhood   Seizures Brother        in his 30s   Colon cancer Neg Hx       VITAL SIGNS BP (!) 147/72   Pulse 74   Temp 98.3 F (36.8 C)   Ht 5\' 2"  (1.575 m)   Wt 182 lb (82.6 kg)   SpO2 97%   BMI 33.29 kg/m   Outpatient Encounter Medications as of 04/28/2023  Medication Sig   acetaminophen (TYLENOL) 325 MG tablet Take 650 mg by mouth 3 (three) times daily.   AMBULATORY NON FORMULARY MEDICATION Medication Name:  Continue monthly catheter changes with 8fr catheter at skilled nursing facility.   amLODipine (NORVASC) 5 MG tablet Take 5 mg by mouth daily.   apixaban (ELIQUIS) 5 MG TABS tablet Take 5 mg by mouth 2 (two) times daily.   augmented betamethasone dipropionate (DIPROLENE-AF) 0.05 % ointment Apply 1 Application topically 2 (two) times daily as needed. Apply to stomach and both breast   calcium-vitamin D (OSCAL WITH D) 500-5 MG-MCG tablet Take 1 tablet by mouth 2 (two) times daily.   camphor-menthol (SARNA) lotion Apply 1 Application topically 2 (two) times daily as needed for itching.   dapagliflozin propanediol (FARXIGA) 10 MG TABS tablet Take 10 mg by mouth daily.   DULoxetine (CYMBALTA) 20 MG capsule Take 20 mg by mouth daily.   feeding supplement (ENSURE ENLIVE / ENSURE PLUS) LIQD Take 237 mLs by mouth daily.   ferrous sulfate 325 (65 FE) MG EC tablet Take 1 tablet (325 mg total) by  mouth daily with breakfast.   fexofenadine (ALLEGRA) 60 MG tablet Take 60 mg by mouth daily.   gabapentin (NEURONTIN) 300 MG capsule Take 300 mg by mouth 3 (three) times daily.   insulin aspart (NOVOLOG FLEXPEN) 100 UNIT/ML FlexPen Inject 8 Units into  the skin 3 (three) times daily with meals. 8 am, 12 pm, and 6 pm   Insulin Pen Needle 30G X 5 MM MISC 1 Device by Does not apply route daily. 3/16"   ipratropium-albuterol (DUONEB) 0.5-2.5 (3) MG/3ML SOLN Take 3 mLs by nebulization every 6 (six) hours as needed (wheezing).   lamoTRIgine (LAMICTAL) 25 MG tablet Take 50 mg by mouth at bedtime.   lamoTRIgine (LAMICTAL) 25 MG tablet Take 25 mg by mouth daily. 9 am   LANTUS SOLOSTAR 100 UNIT/ML Solostar Pen Inject 35 Units into the skin at bedtime.   levETIRAcetam (KEPPRA) 1000 MG tablet Take 1 tablet (1,000 mg total) by mouth 2 (two) times daily.   linaclotide (LINZESS) 145 MCG CAPS capsule Take 145 mcg by mouth daily as needed.   LORazepam (ATIVAN) 2 MG/ML injection Inject 0.5 mLs (1 mg total) into the muscle every 15 (fifteen) minutes as needed.   melatonin 5 MG TABS Take 5 mg by mouth at bedtime.   Multiple Vitamins-Minerals (THEREMS M PO) Take 1 tablet by mouth daily. (multivitamin with folic acid) tablet; 400 mcg;   NON FORMULARY Diet - Regular   omeprazole (PRILOSEC) 40 MG capsule Take 1 capsule (40 mg total) by mouth in the morning and at bedtime.   Phenylephrine-Cocoa Butter 0.25-85.5 % SUPP Place 1 suppository rectally 2 (two) times daily as needed.   rOPINIRole (REQUIP) 1 MG tablet Take 1 mg by mouth at bedtime.   rosuvastatin (CRESTOR) 20 MG tablet Take 20 mg by mouth every evening.   sodium bicarbonate 650 MG tablet Take 1 tablet (650 mg total) by mouth 2 (two) times daily.   tiZANidine (ZANAFLEX) 2 MG tablet Take 2 mg by mouth every 6 (six) hours as needed for muscle spasms (for back and sciatic pain).   zinc oxide 20 % ointment Apply 1 Application topically See admin instructions. Apply  every shift to bilateral buttocks, sacrum, and coccyx   [DISCONTINUED] lamoTRIgine (LAMICTAL) 25 MG tablet Start 25mg  nightly for 7 days then increase to 50mg  nightly   No facility-administered encounter medications on file as of 04/28/2023.     SIGNIFICANT DIAGNOSTIC EXAMS  PREVIOUS   04-15-22; dexa: t score -3.391  NO NEW EXAMS    LABS REVIEWED PREVIOUS   04-09-22: liver normal protein 6.0 albumin 3.3; chol 143; ldl 81; trig 71; hdl 48; tsh 3.502  04-27-22; wbc 6.6; hgb 8.7; hct 26.2; mcv 94.6 plt 231; glucose 152; bun 66; creat 1.26; k+ 4.7; na++ 132; ca 8.9; gfr 42; protein 6.7 albumin 3.4 hgb a1c 7.0; vitamin D 37.96 07-09-22: wbc 9.3; hgb 8.5; hct 26.9; mcv 96.8 plt 196; glucose 200; bun 65; creat 2.15; k+ 5.0; na++ 128; ca 7.8; gfr 22 07-10-22: glucose 130; bun 59; creat 1.75; k+ 4.2; na++ 131; ca 7.6; gfr 29 07-30-22: urine micro-albumin 59.7; ACR 127   08-20-22: hgb A1c 8.8 10-01-22: wbc 5.6; hgb 9.6; hct 29.9; mcv 94.9 plt 245; glucose 404; bun 43; creat 1.69; k+ 5.2; na++ 135; ca 8.2 gfr 30 protein 7.2 albumin 3.5 10-19-22: glucose 176; bun 45; creat 1.63; k+ 5.8; na++ 134; ca 8.9; gfr 31 10-31-22: wbc 4.6; hgb 8.4; hct 26.5; mcv 94.0 plt 183; glucose 93; bun 46; creat 1.64; k+ 5.2; na++ 134; ca 8.6; gfr 31; protein 6.5 albumin 3.2  11-05-22: glucose 117; bun 35; creat 1.25; k+ 4.3;na++ 137; ca 7.5; gfr 43; hgb A1c 8.5; vitamin D 26.12; chol 149; ldl 73 trig 166; hdl 43  01-25-23: wbc 4.5;  hgb 8.8; hct 27.0; mcv 94.7 plt 178; glucose 163; bun 45; creat 1.58; k+ 5.8; na++ 134; ca 8.7; gfr 32; protein 6.6 albumin 3.4 iron 54; tibc 305; vitamin B12: 496; keppra 80.7 (normal 10-40) 01-26-23: glucose 116; bun 41; creat 1.54; k+ 5.5; na++ 133; ca 8.5 gfr 33   02-08-23: keppra 41.6 (10-40) 02-18-23: hgb A1c 7.5; tsh 5.113 02-19-23: wbc 6.0; hgb 7.9; hct 24.7; mcv 95.7 plt 202; glucose 225; bun 45; creat 1.76; k+ 6.4; na++ 130; ca 8.7; gfr 28; (repeat k+ 5.4) 02-20-23: wbc 6.2; hgb 7.5; hct 24.1;  mcv 95.6 plt 186;glucose 195; bun 45; creat 1.76; k+ 5.6; na++ 134; ca 8.5 gfr 28; protein 6.2; albumin 3.1 free t4: 0.80; mag 1.8 02-21-23: vitamin B12 781; folate 26.1; iron 26; tibc 266; ferritin 139 03-23-23: wbc 6.3; hgb 10.0; hct 31.0; mcv 92.8 plt 186; glucose 239; bun 41; creat 1.35; k+ 4.6; na++ 135; ca 8.7; gfr 39; protein 7.6 albumin 3.6   TODAY  04-05-23: glucose 215; bun 49; creat 1.88; k+ 4.3; na++ 134; ca 8.4; gfr 26     Review of Systems  Constitutional:  Negative for malaise/fatigue.  Respiratory:  Negative for cough and shortness of breath.   Cardiovascular:  Negative for chest pain, palpitations and leg swelling.  Gastrointestinal:  Negative for abdominal pain, constipation and heartburn.  Musculoskeletal:  Negative for back pain, joint pain and myalgias.  Skin: Negative.   Neurological:  Negative for dizziness.  Psychiatric/Behavioral:  The patient is not nervous/anxious.    Physical Exam Constitutional:      General: She is not in acute distress.    Appearance: She is well-developed. She is obese. She is not diaphoretic.  Neck:     Thyroid: No thyromegaly.  Cardiovascular:     Rate and Rhythm: Normal rate and regular rhythm.     Pulses: Normal pulses.     Heart sounds: Normal heart sounds.  Pulmonary:     Effort: Pulmonary effort is normal. No respiratory distress.     Breath sounds: Normal breath sounds.  Abdominal:     General: Bowel sounds are normal. There is no distension.     Palpations: Abdomen is soft.     Tenderness: There is no abdominal tenderness.  Genitourinary:    Comments: Foley  Musculoskeletal:     Cervical back: Neck supple.     Right lower leg: No edema.     Left lower leg: No edema.     Comments: Limited range of motion in upper extremities Does not move lower extremities   Lymphadenopathy:     Cervical: No cervical adenopathy.  Skin:    General: Skin is warm and dry.  Neurological:     Mental Status: She is alert. Mental status is  at baseline.     Comments: 03-22-23: SLUMS 14/30  Psychiatric:        Mood and Affect: Mood normal.       ASSESSMENT/ PLAN:  TODAY  Restless leg syndrome: will continue requip 1 mg nightly   2. Hypertension associated with stage 4 chronic kidney disease due to type 2 diabetes mellitus: 147/71 will continue norvasc 5 mg daily   3. Seizure; will continue keppra 1000 mg twice daily did not tolerate lower dose. Will continue lamictal 25 mg in the AM and 50 mg in the PM; neurology is aware  4. 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 Hyperlipidemia associated with type 2 diabetes mellitus: ldl 73  continue crestor 20 mg daily   6. Post menopausal osteoporosis t score -3.391 will continue calcium   7. GERD without esophagitis: will continue prilosec 40 mg twice daily is off reglan is followed by GI  8. Chronic constipation: has BM nearly every day; will continue miralax daily and linzess 145 mcg daily as needed   9. Lumbar radicular syndrome/vascular necrosis bilateral hips: will continue tylenol 650 mg every 8 hours; gabapentin 300 mg three times daily and cymbalta 20 mg daily   10. Diabetic peripheral neuropathy: will continue gabapentin 300 mg three times daily   11. CKD stage 4 due to type 2 diabetes mellitus: bun 49; creat 1.88 gfr 26  12. Anemia associated with type 2 diabetes mellitus due to underlying condition: hgb 8.5 will monitor   13. Urine retention/neurogenic bladder: has long term foley; followed by urology   14. Chronic anxiety: is off medications  15. Protein calorie malnutrition severe: albumin 3.6; protein 6.7 will continue supplements as indicated  16. Aortic atherosclerosis (ct 01-26-21) is on asa and statin   17. Central cord syndrome at C4 level of cervical spine  compression subsequent encounter/cord compression quadriplegia C1-4 (01-25-21) asa 81 mg daily; zanaflex 2 mg every 6 hours as needed for spasticity   18. Neurocognitive deficits: 03-22-23: SLUMS 14/30.     Synthia Innocent NP [facility]  call [phone]

## 2023-04-29 ENCOUNTER — Other Ambulatory Visit (HOSPITAL_COMMUNITY)
Admission: RE | Admit: 2023-04-29 | Discharge: 2023-04-29 | Disposition: A | Discharge status: Home / Self Care | Payer: Medicare HMO | Source: Skilled Nursing Facility | Attending: Adult Health | Admitting: Adult Health

## 2023-04-29 DIAGNOSIS — E1159 Type 2 diabetes mellitus with other circulatory complications: Secondary | ICD-10-CM | POA: Diagnosis not present

## 2023-04-29 LAB — HEMOGLOBIN A1C
Hgb A1c MFr Bld: 10.6 % — ABNORMAL HIGH (ref 4.8–5.6)
Mean Plasma Glucose: 257.52 mg/dL

## 2023-05-03 ENCOUNTER — Encounter: Payer: Self-pay | Admitting: Adult Health

## 2023-05-03 ENCOUNTER — Non-Acute Institutional Stay (SKILLED_NURSING_FACILITY): Payer: Medicare HMO | Admitting: Adult Health

## 2023-05-03 DIAGNOSIS — E1122 Type 2 diabetes mellitus with diabetic chronic kidney disease: Secondary | ICD-10-CM | POA: Diagnosis not present

## 2023-05-03 DIAGNOSIS — N184 Chronic kidney disease, stage 4 (severe): Secondary | ICD-10-CM

## 2023-05-03 DIAGNOSIS — Z794 Long term (current) use of insulin: Secondary | ICD-10-CM | POA: Diagnosis not present

## 2023-05-03 NOTE — Progress Notes (Unsigned)
Location:  [facility] Room Number: 154 Place of Service:  SNF (31)   CODE STATUS: dnr   Allergies  Allergen Reactions   Ace Inhibitors Other (See Comments)    Hyperkalemia    Codeine     Unknown reaction   Sulfa Antibiotics     Unknown reaction    Chief Complaint  Patient presents with   Acute Visit    Diabetes     HPI:  Her hgb A1c is 10.2 She is presently taking farxiga 10 mg daily; lantus 35 units and novolog 8 units with meals.  There are no reports of hypoglycemia. Her appetite is good. There are no reports of excessive hunger or thirst.   Past Medical History:  Diagnosis Date   Adult failure to thrive    Anemia    Anxiety    Atherosclerosis of aorta (HCC)    Central cord syndrome at C4 level of cervical spinal cord, subsequent encounter (HCC)    CKD (chronic kidney disease)    stage 3   Depression    DM type 2 with diabetic peripheral neuropathy (HCC)    Dysphagia    GERD (gastroesophageal reflux disease)    Gout    High cholesterol    HTN (hypertension)    Hyponatremia    MDD (major depressive disorder)    Neck pain    Neuropathy    Quadriplegia, C1-C4 incomplete (HCC)    Radiculopathy    RLS (restless legs syndrome)    Urinary retention     Past Surgical History:  Procedure Laterality Date   ABDOMINAL HYSTERECTOMY     ANTERIOR CERVICAL DECOMP/DISCECTOMY FUSION N/A 02/05/2021   Procedure: Cervical Three-Four  Anterior cervical decompression/discectomy/fusion;  Surgeon: Coletta Memos, MD;  Location: [facility] OR;  Service: Neurosurgery;  Laterality: N/A;  RM 20   APPENDECTOMY     BACK SURGERY     BALLOON DILATION N/A 11/19/2022   Procedure: BALLOON DILATION;  Surgeon: Lanelle Bal, DO;  Location: [facility];  Service: Endoscopy;  Laterality: N/A;   BIOPSY  09/22/2021   Procedure: BIOPSY;  Surgeon: Lanelle Bal, DO;  [facility]: [facility];  Service: Endoscopy;;   BIOPSY  11/19/2022   Procedure: BIOPSY;  Surgeon:  Lanelle Bal, DO;  [facility]: [facility];  Service: Endoscopy;;   CERVICAL DISC SURGERY     ESOPHAGOGASTRODUODENOSCOPY (EGD) WITH PROPOFOL N/A 09/22/2021   Procedure: ESOPHAGOGASTRODUODENOSCOPY (EGD) WITH PROPOFOL;  Surgeon: Lanelle Bal, DO;  [facility]: [facility];  Service: Endoscopy;  Laterality: N/A;  1:00pm   ESOPHAGOGASTRODUODENOSCOPY (EGD) WITH PROPOFOL N/A 11/19/2022   Procedure: ESOPHAGOGASTRODUODENOSCOPY (EGD) WITH PROPOFOL;  Surgeon: Lanelle Bal, DO;  [facility]: [facility];  Service: Endoscopy;  Laterality: N/A;  11:30 am, asa 3   HAND SURGERY     KNEE SURGERY      Social History   Socioeconomic History   Marital status: Widowed    Spouse name: Not on file   Number of children: Not on file   Years of education: Not on file   Highest education level: Not on file  Occupational History   Not on file  Tobacco Use   Smoking status: Never   Smokeless tobacco: Never  Vaping Use   Vaping status: Never Used  Substance and Sexual Activity   Alcohol use: Never   Drug use: Never   Sexual activity: Not Currently  Other Topics Concern   Not on file  Social History  Narrative   [facility] at [facility]   Social Determinants of Health   Financial Resource Strain: Not on file  Food Insecurity: No Food Insecurity (02/20/2023)   Hunger Vital Sign    Worried About Running Out of Food in the Last Year: Never true    Ran Out of Food in the Last Year: Never true  Transportation Needs: No Transportation Needs (02/20/2023)   PRAPARE - Administrator, Civil Service (Medical): No    Lack of Transportation (Non-Medical): No  Physical Activity: Not on file  Stress: Not on file  Social Connections: Not on file  Intimate Partner Violence: Not At Risk (02/20/2023)   Humiliation, Afraid, Rape, and Kick questionnaire    Fear of Current or Ex-Partner: No    Emotionally Abused: No    Physically Abused: No    Sexually Abused: No   Family History  Problem  Relation Age of Onset   Cancer Mother    Cardiomyopathy Father    Seizures Sister        childhood   Seizures Brother        in his 49s   Colon cancer Neg Hx       VITAL SIGNS BP 120/70   Pulse 74   Temp 97.8 F (36.6 C)   Resp (!) 22   Ht 5\' 2"  (1.575 m)   Wt 182 lb (82.6 kg)   SpO2 97%   BMI 33.29 kg/m   Outpatient Encounter Medications as of 05/03/2023  Medication Sig   acetaminophen (TYLENOL) 325 MG tablet Take 650 mg by mouth 3 (three) times daily.   AMBULATORY NON FORMULARY MEDICATION Medication Name:  Continue monthly catheter changes with 81fr catheter at skilled nursing facility.   amLODipine (NORVASC) 5 MG tablet Take 5 mg by mouth daily.   apixaban (ELIQUIS) 5 MG TABS tablet Take 5 mg by mouth 2 (two) times daily.   augmented betamethasone dipropionate (DIPROLENE-AF) 0.05 % ointment Apply 1 Application topically 2 (two) times daily as needed. Apply to stomach and both breast   calcium-vitamin D (OSCAL WITH D) 500-5 MG-MCG tablet Take 1 tablet by mouth 2 (two) times daily.   camphor-menthol (SARNA) lotion Apply 1 Application topically 2 (two) times daily as needed for itching.   dapagliflozin propanediol (FARXIGA) 10 MG TABS tablet Take 10 mg by mouth daily.   DULoxetine (CYMBALTA) 20 MG capsule Take 20 mg by mouth daily.   feeding supplement (ENSURE ENLIVE / ENSURE PLUS) LIQD Take 237 mLs by mouth daily.   ferrous sulfate 325 (65 FE) MG EC tablet Take 1 tablet (325 mg total) by mouth daily with breakfast.   fexofenadine (ALLEGRA) 60 MG tablet Take 60 mg by mouth daily.   gabapentin (NEURONTIN) 300 MG capsule Take 300 mg by mouth 3 (three) times daily.   insulin aspart (NOVOLOG FLEXPEN) 100 UNIT/ML FlexPen Inject 8 Units into the skin 3 (three) times daily with meals. 8 am, 12 pm, and 6 pm   Insulin Pen Needle 30G X 5 MM MISC 1 Device by Does not apply route daily. 3/16"   ipratropium-albuterol (DUONEB) 0.5-2.5 (3) MG/3ML SOLN Take 3 mLs by nebulization every 6 (six)  hours as needed (wheezing).   lamoTRIgine (LAMICTAL) 25 MG tablet Take 50 mg by mouth at bedtime.   lamoTRIgine (LAMICTAL) 25 MG tablet Take 25 mg by mouth daily. 9 am   LANTUS SOLOSTAR 100 UNIT/ML Solostar Pen Inject 35 Units into the skin at bedtime.   levETIRAcetam (  KEPPRA) 1000 MG tablet Take 1 tablet (1,000 mg total) by mouth 2 (two) times daily.   linaclotide (LINZESS) 145 MCG CAPS capsule Take 145 mcg by mouth daily as needed.   LORazepam (ATIVAN) 2 MG/ML injection Inject 0.5 mLs (1 mg total) into the muscle every 15 (fifteen) minutes as needed.   melatonin 5 MG TABS Take 5 mg by mouth at bedtime.   Multiple Vitamins-Minerals (THEREMS M PO) Take 1 tablet by mouth daily. (multivitamin with folic acid) tablet; 400 mcg;   NON FORMULARY Diet - Regular   omeprazole (PRILOSEC) 40 MG capsule Take 1 capsule (40 mg total) by mouth in the morning and at bedtime.   Phenylephrine-Cocoa Butter 0.25-85.5 % SUPP Place 1 suppository rectally 2 (two) times daily as needed.   rOPINIRole (REQUIP) 1 MG tablet Take 1 mg by mouth at bedtime.   rosuvastatin (CRESTOR) 20 MG tablet Take 20 mg by mouth every evening.   sodium bicarbonate 650 MG tablet Take 1 tablet (650 mg total) by mouth 2 (two) times daily.   tiZANidine (ZANAFLEX) 2 MG tablet Take 2 mg by mouth every 6 (six) hours as needed for muscle spasms (for back and sciatic pain).   zinc oxide 20 % ointment Apply 1 Application topically See admin instructions. Apply every shift to bilateral buttocks, sacrum, and coccyx   No facility-administered encounter medications on file as of 05/03/2023.     SIGNIFICANT DIAGNOSTIC EXAMS  PREVIOUS   04-15-22; dexa: t score -3.391  NO NEW EXAMS    LABS REVIEWED PREVIOUS   07-09-22: wbc 9.3; hgb 8.5; hct 26.9; mcv 96.8 plt 196; glucose 200; bun 65; creat 2.15; k+ 5.0; na++ 128; ca 7.8; gfr 22 07-10-22: glucose 130; bun 59; creat 1.75; k+ 4.2; na++ 131; ca 7.6; gfr 29 07-30-22: urine micro-albumin 59.7; ACR 127    08-20-22: hgb A1c 8.8 10-01-22: wbc 5.6; hgb 9.6; hct 29.9; mcv 94.9 plt 245; glucose 404; bun 43; creat 1.69; k+ 5.2; na++ 135; ca 8.2 gfr 30 protein 7.2 albumin 3.5 10-19-22: glucose 176; bun 45; creat 1.63; k+ 5.8; na++ 134; ca 8.9; gfr 31 10-31-22: wbc 4.6; hgb 8.4; hct 26.5; mcv 94.0 plt 183; glucose 93; bun 46; creat 1.64; k+ 5.2; na++ 134; ca 8.6; gfr 31; protein 6.5 albumin 3.2  11-05-22: glucose 117; bun 35; creat 1.25; k+ 4.3;na++ 137; ca 7.5; gfr 43; hgb A1c 8.5; vitamin D 26.12; chol 149; ldl 73 trig 166; hdl 43  01-25-23: wbc 4.5; hgb 8.8; hct 27.0; mcv 94.7 plt 178; glucose 163; bun 45; creat 1.58; k+ 5.8; na++ 134; ca 8.7; gfr 32; protein 6.6 albumin 3.4 iron 54; tibc 305; vitamin B12: 496; keppra 80.7 (normal 10-40) 01-26-23: glucose 116; bun 41; creat 1.54; k+ 5.5; na++ 133; ca 8.5 gfr 33   02-08-23: keppra 41.6 (10-40) 02-18-23: hgb A1c 7.5; tsh 5.113 02-19-23: wbc 6.0; hgb 7.9; hct 24.7; mcv 95.7 plt 202; glucose 225; bun 45; creat 1.76; k+ 6.4; na++ 130; ca 8.7; gfr 28; (repeat k+ 5.4) 02-20-23: wbc 6.2; hgb 7.5; hct 24.1; mcv 95.6 plt 186;glucose 195; bun 45; creat 1.76; k+ 5.6; na++ 134; ca 8.5 gfr 28; protein 6.2; albumin 3.1 free t4: 0.80; mag 1.8 02-21-23: vitamin B12 781; folate 26.1; iron 26; tibc 266; ferritin 139 03-23-23: wbc 6.3; hgb 10.0; hct 31.0; mcv 92.8 plt 186; glucose 239; bun 41; creat 1.35; k+ 4.6; na++ 135; ca 8.7; gfr 39; protein 7.6 albumin 3.6   TODAY  04-05-23: glucose 215; bun  49; creat 1.88; k+ 4.3; na++ 134; ca 8.4; gfr 26 04-29-23: hgb A1c 10.2      Review of Systems  Constitutional:  Negative for malaise/fatigue.  Respiratory:  Negative for cough and shortness of breath.   Cardiovascular:  Negative for chest pain, palpitations and leg swelling.  Gastrointestinal:  Negative for abdominal pain, constipation and heartburn.  Musculoskeletal:  Negative for back pain, joint pain and myalgias.  Skin: Negative.   Neurological:  Negative for dizziness.   Psychiatric/Behavioral:  The patient is not nervous/anxious.    Physical Exam Constitutional:      General: She is not in acute distress.    Appearance: She is well-developed. She is obese. She is not diaphoretic.  Neck:     Thyroid: No thyromegaly.  Cardiovascular:     Rate and Rhythm: Normal rate and regular rhythm.     Pulses: Normal pulses.     Heart sounds: Normal heart sounds.  Pulmonary:     Effort: Pulmonary effort is normal. No respiratory distress.     Breath sounds: Normal breath sounds.  Abdominal:     General: Bowel sounds are normal. There is no distension.     Palpations: Abdomen is soft.     Tenderness: There is no abdominal tenderness.  Genitourinary:    Comments: foley Musculoskeletal:     Cervical back: Neck supple.     Right lower leg: No edema.     Left lower leg: No edema.     Comments:  Limited range of motion in upper extremities Does not move lower extremities    Lymphadenopathy:     Cervical: No cervical adenopathy.  Skin:    General: Skin is warm and dry.  Neurological:     Mental Status: She is alert.     Comments: 03-22-23: SLUMS 14/30   Psychiatric:        Mood and Affect: Mood normal.        ASSESSMENT/ PLAN:  TODAY  Type 2 diabetes mellitus with stage 4 chronic kidney disease with long term current use of insulin: hgb A1c 10.2; will increase lantus to 40 units nightly; novolog 10 units with meals. Will monitor     Synthia Innocent NP [facility]   call [phone]

## 2023-05-11 ENCOUNTER — Non-Acute Institutional Stay (SKILLED_NURSING_FACILITY): Payer: Medicare HMO | Admitting: Adult Health

## 2023-05-11 ENCOUNTER — Encounter: Payer: Self-pay | Admitting: Adult Health

## 2023-05-11 DIAGNOSIS — Z794 Long term (current) use of insulin: Secondary | ICD-10-CM

## 2023-05-11 DIAGNOSIS — E1122 Type 2 diabetes mellitus with diabetic chronic kidney disease: Secondary | ICD-10-CM

## 2023-05-11 DIAGNOSIS — N184 Chronic kidney disease, stage 4 (severe): Secondary | ICD-10-CM

## 2023-05-11 NOTE — Progress Notes (Unsigned)
Location:  [facility] Room Number: 154 Place of Service:  SNF (31)   CODE STATUS: dnr   Allergies  Allergen Reactions   Ace Inhibitors Other (See Comments)    Hyperkalemia    Codeine     Unknown reaction   Sulfa Antibiotics     Unknown reaction    Chief Complaint  Patient presents with   Acute Visit    Glucose readings     HPI:  Her cbg readings remain elevated. She is taking lantus 40 units; aspart 10 units with meals; farxiga 10 mg daily. There are no reports of excessive hunger or thirst.  Her appetite is good.   Past Medical History:  Diagnosis Date   Adult failure to thrive    Anemia    Anxiety    Atherosclerosis of aorta (HCC)    Central cord syndrome at C4 level of cervical spinal cord, subsequent encounter (HCC)    CKD (chronic kidney disease)    stage 3   Depression    DM type 2 with diabetic peripheral neuropathy (HCC)    Dysphagia    GERD (gastroesophageal reflux disease)    Gout    High cholesterol    HTN (hypertension)    Hyponatremia    MDD (major depressive disorder)    Neck pain    Neuropathy    Quadriplegia, C1-C4 incomplete (HCC)    Radiculopathy    RLS (restless legs syndrome)    Urinary retention     Past Surgical History:  Procedure Laterality Date   ABDOMINAL HYSTERECTOMY     ANTERIOR CERVICAL DECOMP/DISCECTOMY FUSION N/A 02/05/2021   Procedure: Cervical Three-Four  Anterior cervical decompression/discectomy/fusion;  Surgeon: Coletta Memos, MD;  Location: [facility] OR;  Service: Neurosurgery;  Laterality: N/A;  RM 20   APPENDECTOMY     BACK SURGERY     BALLOON DILATION N/A 11/19/2022   Procedure: BALLOON DILATION;  Surgeon: Lanelle Bal, DO;  [facility]: [facility];  Service: Endoscopy;  Laterality: N/A;   BIOPSY  09/22/2021   Procedure: BIOPSY;  Surgeon: Lanelle Bal, DO;  [facility];  Service: Endoscopy;;   BIOPSY  11/19/2022   Procedure: BIOPSY;  Surgeon: Lanelle Bal, DO;  [facility];  Service: Endoscopy;;   CERVICAL DISC SURGERY     ESOPHAGOGASTRODUODENOSCOPY (EGD) WITH PROPOFOL N/A 09/22/2021   Procedure: ESOPHAGOGASTRODUODENOSCOPY (EGD) WITH PROPOFOL;  Surgeon: Lanelle Bal, DO;  [facility];  Service: Endoscopy;  Laterality: N/A;  1:00pm   ESOPHAGOGASTRODUODENOSCOPY (EGD) WITH PROPOFOL N/A 11/19/2022   Procedure: ESOPHAGOGASTRODUODENOSCOPY (EGD) WITH PROPOFOL;  Surgeon: Lanelle Bal, DO;  [facility]: [facility];  Service: Endoscopy;  Laterality: N/A;  11:30 am, asa 3   HAND SURGERY     KNEE SURGERY      Social History   Socioeconomic History   Marital status: Widowed    Spouse name: Not on file   Number of children: Not on file   Years of education: Not on file   Highest education level: Not on file  Occupational History   Not on file  Tobacco Use   Smoking status: Never   Smokeless tobacco: Never  Vaping Use   Vaping status: Never Used  Substance and Sexual Activity   Alcohol use: Never   Drug use: Never   Sexual activity: Not Currently  Other Topics Concern   Not on file  Social History Narrative   [facility] at [facility]   Social Determinants of Health   Financial Resource Strain: Not on file  Food Insecurity: No Food Insecurity (02/20/2023)   Hunger Vital Sign    Worried About Running Out of Food in the Last Year: Never true    Ran Out of Food in the Last Year: Never true  Transportation Needs: No Transportation Needs (02/20/2023)   PRAPARE - Administrator, Civil Service (Medical): No    Lack of Transportation (Non-Medical): No  Physical Activity: Not on file  Stress: Not on file  Social Connections: Not on file  Intimate Partner Violence: Not At Risk (02/20/2023)   Humiliation, Afraid, Rape, and Kick questionnaire    Fear of Current or Ex-Partner: No    Emotionally Abused: No    Physically Abused: No    Sexually Abused: No   Family History  Problem Relation Age of Onset   Cancer  Mother    Cardiomyopathy Father    Seizures Sister        childhood   Seizures Brother        in his 79s   Colon cancer Neg Hx       VITAL SIGNS BP 130/78   Pulse 69   Temp (!) 97.3 F (36.3 C)   Resp 18   Ht 5\' 2"  (1.575 m)   Wt 182 lb (82.6 kg)   SpO2 98%   BMI 33.29 kg/m   Outpatient Encounter Medications as of 05/11/2023  Medication Sig   acetaminophen (TYLENOL) 325 MG tablet Take 650 mg by mouth 3 (three) times daily.   AMBULATORY NON FORMULARY MEDICATION Medication Name:  Continue monthly catheter changes with 65fr catheter at skilled nursing facility.   amLODipine (NORVASC) 5 MG tablet Take 5 mg by mouth daily.   apixaban (ELIQUIS) 5 MG TABS tablet Take 5 mg by mouth 2 (two) times daily.   augmented betamethasone dipropionate (DIPROLENE-AF) 0.05 % ointment Apply 1 Application topically 2 (two) times daily as needed. Apply to stomach and both breast   calcium-vitamin D (OSCAL WITH D) 500-5 MG-MCG tablet Take 1 tablet by mouth 2 (two) times daily.   camphor-menthol (SARNA) lotion Apply 1 Application topically 2 (two) times daily as needed for itching.   dapagliflozin propanediol (FARXIGA) 10 MG TABS tablet Take 10 mg by mouth daily.   DULoxetine (CYMBALTA) 20 MG capsule Take 20 mg by mouth daily.   feeding supplement (ENSURE ENLIVE / ENSURE PLUS) LIQD Take 237 mLs by mouth daily.   ferrous sulfate 325 (65 FE) MG EC tablet Take 1 tablet (325 mg total) by mouth daily with breakfast.   fexofenadine (ALLEGRA) 60 MG tablet Take 60 mg by mouth daily.   gabapentin (NEURONTIN) 300 MG capsule Take 300 mg by mouth 3 (three) times daily.   insulin aspart (NOVOLOG FLEXPEN) 100 UNIT/ML FlexPen Inject 10 Units into the skin 3 (three) times daily with meals. 8 am, 12 pm, and 6 pm   Insulin Pen Needle 30G X 5 MM MISC 1 Device by Does not apply route daily. 3/16"   ipratropium-albuterol (DUONEB) 0.5-2.5 (3) MG/3ML SOLN Take 3 mLs by nebulization every 6 (six) hours as needed (wheezing).    lamoTRIgine (LAMICTAL) 25 MG tablet Take 50 mg by mouth at bedtime.   lamoTRIgine (LAMICTAL) 25 MG tablet Take 25 mg by mouth daily. 9 am   LANTUS SOLOSTAR 100 UNIT/ML Solostar Pen Inject 40 Units into the skin at bedtime.   levETIRAcetam (KEPPRA) 1000 MG tablet Take 1 tablet (  1,000 mg total) by mouth 2 (two) times daily.   linaclotide (LINZESS) 145 MCG CAPS capsule Take 145 mcg by mouth daily as needed.   LORazepam (ATIVAN) 2 MG/ML injection Inject 0.5 mLs (1 mg total) into the muscle every 15 (fifteen) minutes as needed.   melatonin 5 MG TABS Take 5 mg by mouth at bedtime.   Multiple Vitamins-Minerals (THEREMS M PO) Take 1 tablet by mouth daily. (multivitamin with folic acid) tablet; 400 mcg;   NON FORMULARY Diet - Regular   omeprazole (PRILOSEC) 40 MG capsule Take 1 capsule (40 mg total) by mouth in the morning and at bedtime.   Phenylephrine-Cocoa Butter 0.25-85.5 % SUPP Place 1 suppository rectally 2 (two) times daily as needed.   rOPINIRole (REQUIP) 1 MG tablet Take 1 mg by mouth at bedtime.   rosuvastatin (CRESTOR) 20 MG tablet Take 20 mg by mouth every evening.   sodium bicarbonate 650 MG tablet Take 1 tablet (650 mg total) by mouth 2 (two) times daily.   tiZANidine (ZANAFLEX) 2 MG tablet Take 2 mg by mouth every 6 (six) hours as needed for muscle spasms (for back and sciatic pain).   zinc oxide 20 % ointment Apply 1 Application topically See admin instructions. Apply every shift to bilateral buttocks, sacrum, and coccyx   No facility-administered encounter medications on file as of 05/11/2023.     SIGNIFICANT DIAGNOSTIC EXAMS  PREVIOUS   04-15-22; dexa: t score -3.391  NO NEW EXAMS    LABS REVIEWED PREVIOUS   07-09-22: wbc 9.3; hgb 8.5; hct 26.9; mcv 96.8 plt 196; glucose 200; bun 65; creat 2.15; k+ 5.0; na++ 128; ca 7.8; gfr 22 07-10-22: glucose 130; bun 59; creat 1.75; k+ 4.2; na++ 131; ca 7.6; gfr 29 07-30-22: urine micro-albumin 59.7; ACR 127   08-20-22: hgb A1c  8.8 10-01-22: wbc 5.6; hgb 9.6; hct 29.9; mcv 94.9 plt 245; glucose 404; bun 43; creat 1.69; k+ 5.2; na++ 135; ca 8.2 gfr 30 protein 7.2 albumin 3.5 10-19-22: glucose 176; bun 45; creat 1.63; k+ 5.8; na++ 134; ca 8.9; gfr 31 10-31-22: wbc 4.6; hgb 8.4; hct 26.5; mcv 94.0 plt 183; glucose 93; bun 46; creat 1.64; k+ 5.2; na++ 134; ca 8.6; gfr 31; protein 6.5 albumin 3.2  11-05-22: glucose 117; bun 35; creat 1.25; k+ 4.3;na++ 137; ca 7.5; gfr 43; hgb A1c 8.5; vitamin D 26.12; chol 149; ldl 73 trig 166; hdl 43  01-25-23: wbc 4.5; hgb 8.8; hct 27.0; mcv 94.7 plt 178; glucose 163; bun 45; creat 1.58; k+ 5.8; na++ 134; ca 8.7; gfr 32; protein 6.6 albumin 3.4 iron 54; tibc 305; vitamin B12: 496; keppra 80.7 (normal 10-40) 01-26-23: glucose 116; bun 41; creat 1.54; k+ 5.5; na++ 133; ca 8.5 gfr 33   02-08-23: keppra 41.6 (10-40) 02-18-23: hgb A1c 7.5; tsh 5.113 02-19-23: wbc 6.0; hgb 7.9; hct 24.7; mcv 95.7 plt 202; glucose 225; bun 45; creat 1.76; k+ 6.4; na++ 130; ca 8.7; gfr 28; (repeat k+ 5.4) 02-20-23: wbc 6.2; hgb 7.5; hct 24.1; mcv 95.6 plt 186;glucose 195; bun 45; creat 1.76; k+ 5.6; na++ 134; ca 8.5 gfr 28; protein 6.2; albumin 3.1 free t4: 0.80; mag 1.8 02-21-23: vitamin B12 781; folate 26.1; iron 26; tibc 266; ferritin 139 03-23-23: wbc 6.3; hgb 10.0; hct 31.0; mcv 92.8 plt 186; glucose 239; bun 41; creat 1.35; k+ 4.6; na++ 135; ca 8.7; gfr 39; protein 7.6 albumin 3.6  04-05-23: glucose 215; bun 49; creat 1.88; k+ 4.3; na++ 134; ca 8.4; gfr  26 04-29-23: hgb A1c 10.2    NO NEW LABS.     Review of Systems  Constitutional:  Negative for malaise/fatigue.  Respiratory:  Negative for cough and shortness of breath.   Cardiovascular:  Negative for chest pain, palpitations and leg swelling.  Gastrointestinal:  Negative for abdominal pain, constipation and heartburn.  Musculoskeletal:  Negative for back pain, joint pain and myalgias.  Skin: Negative.   Neurological:  Negative for dizziness.  Psychiatric/Behavioral:   The patient is not nervous/anxious.     Physical Exam Constitutional:      General: She is not in acute distress.    Appearance: She is well-developed. She is obese. She is not diaphoretic.  Neck:     Thyroid: No thyromegaly.  Cardiovascular:     Rate and Rhythm: Normal rate and regular rhythm.     Heart sounds: Normal heart sounds.  Pulmonary:     Effort: Pulmonary effort is normal. No respiratory distress.     Breath sounds: Normal breath sounds.  Abdominal:     General: Bowel sounds are normal. There is no distension.     Palpations: Abdomen is soft.     Tenderness: There is no abdominal tenderness.  Genitourinary:    Comments: foley Musculoskeletal:     Cervical back: Neck supple.     Right lower leg: No edema.     Left lower leg: No edema.     Comments: Limited range of motion in upper extremities Does not move lower extremities     Lymphadenopathy:     Cervical: No cervical adenopathy.  Skin:    General: Skin is warm and dry.  Neurological:     Mental Status: She is alert. Mental status is at baseline.     Comments: 03-22-23: SLUMS 14/30    Psychiatric:        Mood and Affect: Mood normal.      ASSESSMENT/ PLAN:  TODAY  Type 2 diabetes mellitus with chronic renal failure stage 4 with long term current use of insulin: her cbg readings are not at goal: will increase lantus to 45 units and will monitor    Synthia Innocent NP [facility]  call [phone]

## 2023-05-24 ENCOUNTER — Non-Acute Institutional Stay (SKILLED_NURSING_FACILITY): Payer: Medicare HMO | Admitting: Student

## 2023-05-24 ENCOUNTER — Encounter: Payer: Self-pay | Admitting: Student

## 2023-05-24 DIAGNOSIS — E1140 Type 2 diabetes mellitus with diabetic neuropathy, unspecified: Secondary | ICD-10-CM

## 2023-05-24 DIAGNOSIS — R339 Retention of urine, unspecified: Secondary | ICD-10-CM | POA: Diagnosis not present

## 2023-05-24 DIAGNOSIS — R569 Unspecified convulsions: Secondary | ICD-10-CM

## 2023-05-24 DIAGNOSIS — K219 Gastro-esophageal reflux disease without esophagitis: Secondary | ICD-10-CM | POA: Diagnosis not present

## 2023-05-24 DIAGNOSIS — Z794 Long term (current) use of insulin: Secondary | ICD-10-CM

## 2023-05-24 DIAGNOSIS — I10 Essential (primary) hypertension: Secondary | ICD-10-CM

## 2023-05-24 DIAGNOSIS — N184 Chronic kidney disease, stage 4 (severe): Secondary | ICD-10-CM

## 2023-05-24 DIAGNOSIS — G2581 Restless legs syndrome: Secondary | ICD-10-CM | POA: Diagnosis not present

## 2023-05-24 DIAGNOSIS — E1122 Type 2 diabetes mellitus with diabetic chronic kidney disease: Secondary | ICD-10-CM | POA: Diagnosis not present

## 2023-05-24 NOTE — Assessment & Plan Note (Signed)
Last  BMP a month ago show Cr 1.88 with GFR of 26. -On Farxiga 10mg  daily

## 2023-05-24 NOTE — Addendum Note (Signed)
Addended by: Sharee Holster on: 05/24/2023 12:36 PM   Modules accepted: Level of Service

## 2023-05-24 NOTE — Assessment & Plan Note (Signed)
Last seizure was in August prompting increase in Lamotrigine to 50 mg BID. Stable on current regime with no report seizure event. Last saw Neurology on 04/15/23. Had a reduced dose back in May and Keppra back to 1000mg  BID. -Continue Lamotrigine 50 mg BID -Continue 1000 mg Keppra BID

## 2023-05-24 NOTE — Assessment & Plan Note (Signed)
Within goal for patient. Asymptomatic and no overt headache, vision changes or chest pain. -Continue daily 10mg  Amlodipine -continue vitals per resident unit protocol

## 2023-05-24 NOTE — Assessment & Plan Note (Signed)
Stable on daily Omeprazole

## 2023-05-24 NOTE — Assessment & Plan Note (Signed)
Stable on Ropinilole 1mg  at bed time. -continue Repinolole

## 2023-05-24 NOTE — Assessment & Plan Note (Signed)
Last A1c 10.6 from 3 weeks ago. LAI recently increased to 45U daily on 9/10. Shows CBG improvement to below 200 (150-180s). There is always room to increase to 50U if CBG are not within Goal -Continue Lantus at 45U daily -Continue SAI at 10u with meals -Gabapentin 300 mg TID -Farxiga 10 mg daily

## 2023-05-24 NOTE — Progress Notes (Signed)
Location:  [facility]   Place of Service: [facility]   CODE STATUS: DNR  Allergies  Allergen Reactions   Ace Inhibitors Other (See Comments)    Hyperkalemia    Codeine     Unknown reaction   Sulfa Antibiotics     Unknown reaction    No chief complaint on file.   HPI:  [name] is an 87 yo female and long term resident of [facility] with PMH significant for Quadriplegia,  T2DM with neuropathy, CKD 4, Chronic Urinary retention, seizure and Restless leg syndrome. Evaluated patient for her chronic problems. She reports doing well overall other than intermittent RLQ abdominal pain which patient said is a chronic issue that is intermittent. Denies any nausea, vomiting, diarrhea or blood in stool.   Past Medical History:  Diagnosis Date   Adult failure to thrive    Anemia    Anxiety    Atherosclerosis of aorta (HCC)    Central cord syndrome at C4 level of cervical spinal cord, subsequent encounter (HCC)    CKD (chronic kidney disease)    stage 3   Depression    DM type 2 with diabetic peripheral neuropathy (HCC)    Dysphagia    GERD (gastroesophageal reflux disease)    Gout    High cholesterol    HTN (hypertension)    Hyponatremia    MDD (major depressive disorder)    Neck pain    Neuropathy    Quadriplegia, C1-C4 incomplete (HCC)    Radiculopathy    RLS (restless legs syndrome)    Urinary retention     Past Surgical History:  Procedure Laterality Date   ABDOMINAL HYSTERECTOMY     ANTERIOR CERVICAL DECOMP/DISCECTOMY FUSION N/A 02/05/2021   Procedure: Cervical Three-Four  Anterior cervical decompression/discectomy/fusion;  Surgeon: Coletta Memos, MD;  Location: [facility];  Service: Neurosurgery;  Laterality: N/A;  RM 20   APPENDECTOMY     BACK SURGERY     BALLOON DILATION N/A 11/19/2022   Procedure: BALLOON DILATION;  Surgeon: Lanelle Bal, DO;  Location: [facility];  Service: Endoscopy;  Laterality: N/A;   BIOPSY  09/22/2021    Procedure: BIOPSY;  Surgeon: Lanelle Bal, DO;  [facility];  Service: Endoscopy;;   BIOPSY  11/19/2022   Procedure: BIOPSY;  Surgeon: Lanelle Bal, DO;  [facility];  Service: Endoscopy;;   CERVICAL DISC SURGERY     ESOPHAGOGASTRODUODENOSCOPY (EGD) WITH PROPOFOL N/A 09/22/2021   Procedure: ESOPHAGOGASTRODUODENOSCOPY (EGD) WITH PROPOFOL;  Surgeon: Lanelle Bal, DO;  [facility];  Service: Endoscopy;  Laterality: N/A;  1:00pm   ESOPHAGOGASTRODUODENOSCOPY (EGD) WITH PROPOFOL N/A 11/19/2022   Procedure: ESOPHAGOGASTRODUODENOSCOPY (EGD) WITH PROPOFOL;  Surgeon: Lanelle Bal, DO;  [facility];  Service: Endoscopy;  Laterality: N/A;  11:30 am, asa 3   HAND SURGERY     KNEE SURGERY      Social History   Socioeconomic History   Marital status: Widowed    Spouse name: Not on file   Number of children: Not on file   Years of education: Not on file   Highest education level: Not on file  Occupational History   Not on file  Tobacco Use   Smoking status: Never   Smokeless tobacco: Never  Vaping Use   Vaping status: Never Used  Substance and Sexual Activity   Alcohol use: Never   Drug use: Never   Sexual activity: Not Currently  Other Topics Concern   Not on file  Social History Narrative   Lives at [facility]   Social Determinants of Health   Financial Resource Strain: Not on file  Food Insecurity: No Food Insecurity (02/20/2023)   Hunger Vital Sign    Worried About Running Out of Food in the Last Year: Never true    Ran Out of Food in the Last Year: Never true  Transportation Needs: No Transportation Needs (02/20/2023)   PRAPARE - Administrator, Civil Service (Medical): No    Lack of Transportation (Non-Medical): No  Physical Activity: Not on file  Stress: Not on file  Social Connections: Not on file  Intimate Partner Violence: Not At Risk (02/20/2023)   Humiliation, Afraid, Rape, and Kick  questionnaire    Fear of Current or Ex-Partner: No    Emotionally Abused: No    Physically Abused: No    Sexually Abused: No   Family History  Problem Relation Age of Onset   Cancer Mother    Cardiomyopathy Father    Seizures Sister        childhood   Seizures Brother        in his 30s   Colon cancer Neg Hx       VITAL SIGNS Temp 98.1    Pulse 69    RR 20    BP 140/77      O2 96%  Outpatient Encounter Medications as of 05/24/2023  Medication Sig   acetaminophen (TYLENOL) 325 MG tablet Take 650 mg by mouth 3 (three) times daily.   AMBULATORY NON FORMULARY MEDICATION Medication Name:  Continue monthly catheter changes with 50fr catheter at skilled nursing facility.   amLODipine (NORVASC) 5 MG tablet Take 5 mg by mouth daily.   apixaban (ELIQUIS) 5 MG TABS tablet Take 5 mg by mouth 2 (two) times daily.   augmented betamethasone dipropionate (DIPROLENE-AF) 0.05 % ointment Apply 1 Application topically 2 (two) times daily as needed. Apply to stomach and both breast   calcium-vitamin D (OSCAL WITH D) 500-5 MG-MCG tablet Take 1 tablet by mouth 2 (two) times daily.   camphor-menthol (SARNA) lotion Apply 1 Application topically 2 (two) times daily as needed for itching.   dapagliflozin propanediol (FARXIGA) 10 MG TABS tablet Take 10 mg by mouth daily.   DULoxetine (CYMBALTA) 20 MG capsule Take 20 mg by mouth daily.   feeding supplement (ENSURE ENLIVE / ENSURE PLUS) LIQD Take 237 mLs by mouth daily.   ferrous sulfate 325 (65 FE) MG EC tablet Take 1 tablet (325 mg total) by mouth daily with breakfast.   fexofenadine (ALLEGRA) 60 MG tablet Take 60 mg by mouth daily.   gabapentin (NEURONTIN) 300 MG capsule Take 300 mg by mouth 3 (three) times daily.   insulin aspart (NOVOLOG FLEXPEN) 100 UNIT/ML FlexPen Inject 10 Units into the skin 3 (three) times daily with meals. 8 am, 12 pm, and 6 pm   Insulin Pen Needle 30G X 5 MM MISC 1 Device by Does not apply route daily. 3/16"    ipratropium-albuterol (DUONEB) 0.5-2.5 (3) MG/3ML SOLN Take 3 mLs by nebulization every 6 (six) hours as needed (wheezing).   lamoTRIgine (LAMICTAL) 25 MG tablet Take 50 mg by mouth at bedtime.   lamoTRIgine (LAMICTAL) 25 MG tablet Take 25 mg by mouth daily. 9 am   LANTUS SOLOSTAR 100 UNIT/ML Solostar Pen Inject 45 Units into the skin at bedtime.   levETIRAcetam (KEPPRA) 1000 MG tablet Take 1 tablet (  1,000 mg total) by mouth 2 (two) times daily.   linaclotide (LINZESS) 145 MCG CAPS capsule Take 145 mcg by mouth daily as needed.   LORazepam (ATIVAN) 2 MG/ML injection Inject 0.5 mLs (1 mg total) into the muscle every 15 (fifteen) minutes as needed.   melatonin 5 MG TABS Take 5 mg by mouth at bedtime.   Multiple Vitamins-Minerals (THEREMS M PO) Take 1 tablet by mouth daily. (multivitamin with folic acid) tablet; 400 mcg;   NON FORMULARY Diet - Regular   omeprazole (PRILOSEC) 40 MG capsule Take 1 capsule (40 mg total) by mouth in the morning and at bedtime.   Phenylephrine-Cocoa Butter 0.25-85.5 % SUPP Place 1 suppository rectally 2 (two) times daily as needed.   rOPINIRole (REQUIP) 1 MG tablet Take 1 mg by mouth at bedtime.   rosuvastatin (CRESTOR) 20 MG tablet Take 20 mg by mouth every evening.   sodium bicarbonate 650 MG tablet Take 1 tablet (650 mg total) by mouth 2 (two) times daily.   tiZANidine (ZANAFLEX) 2 MG tablet Take 2 mg by mouth every 6 (six) hours as needed for muscle spasms (for back and sciatic pain).   zinc oxide 20 % ointment Apply 1 Application topically See admin instructions. Apply every shift to bilateral buttocks, sacrum, and coccyx   No facility-administered encounter medications on file as of 05/24/2023.     SIGNIFICANT DIAGNOSTIC EXAMS General: Laying in Bed, Quadriplegic,  NAD CV: RRR, no murmurs, normal S1/S2 Pulm: CTAB, good WOB on RA, no crackles or wheezing Abd: Soft, no distension, Tenderness over the LLQ Skin: dry, warm Ext: N Mild LE edema bilaterally,  Quadriplegic       ASSESSMENT/ PLAN:  Abdominal pain: Seems to be a chronic intermittent problem for patient. Suspect this could be secondary to constipation given patient is quadrapligic and has no infectious signs. Will give laxative and monitor pain closely. Vitals are stable.  -Ordered Miralax -Can add Colace if no improvement  -Tylenol PRN for pain   Essential (primary) hypertension Within goal for patient. Asymptomatic and no overt headache, vision changes or chest pain. -Continue daily 10mg  Amlodipine -continue vitals per resident unit protocol   GERD without esophagitis Stable on daily Omeprazole  Type 2 diabetes mellitus with diabetic neuropathy, unspecified (HCC) Last A1c 10.6 from 3 weeks ago. LAI recently increased to 45U daily on 9/10. Shows CBG improvement to below 200 (150-180s). There is always room to increase to 50U if CBG are not within Goal -Continue Lantus at 45U daily -Continue SAI at 10u with meals -Gabapentin 300 mg TID -Farxiga 10 mg daily  CKD stage 4 due to type 2 diabetes mellitus (HCC) Last  BMP a month ago show Cr 1.88 with GFR of 26. -On Farxiga 10mg  daily  Urinary retention Managed with Foley Catheter which is to be changes monthly. Last saw Urology on 03/23/23 for gross hematuria. 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 Hgb 2 months ago was 10 and no hematuria noticed in her Foley bag today. -Continue plan with urology for cytoscopy -continue monthly change of Foley  Seizures (HCC) Last seizure was in August prompting increase in Lamotrigine to 50 mg BID. Stable on current regime with no report seizure event. Last saw Neurology on 04/15/23. Had a reduced dose back in May and Keppra back to 1000mg  BID. -Continue Lamotrigine 50 mg BID -Continue 1000 mg Keppra  BID  RLS (restless legs syndrome) Stable on Ropinilole  1mg  at bed time. -continue Repinolole    Synthia Innocent NP [facility]  Contact [phone] Monday through Friday 8am- 5pm  After hours call [phone]

## 2023-05-24 NOTE — Assessment & Plan Note (Signed)
Managed with Foley Catheter which is to be changes monthly. Last saw Urology on 03/23/23 for gross hematuria. 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 Hgb 2 months ago was 10 and no hematuria noticed in her Foley bag today. -Continue plan with urology for cytoscopy -continue monthly change of Foley

## 2023-05-26 ENCOUNTER — Encounter: Payer: Self-pay | Admitting: Internal Medicine

## 2023-05-26 ENCOUNTER — Non-Acute Institutional Stay (SKILLED_NURSING_FACILITY): Payer: Medicare HMO | Admitting: Internal Medicine

## 2023-05-26 DIAGNOSIS — E1122 Type 2 diabetes mellitus with diabetic chronic kidney disease: Secondary | ICD-10-CM | POA: Diagnosis not present

## 2023-05-26 DIAGNOSIS — G2581 Restless legs syndrome: Secondary | ICD-10-CM

## 2023-05-26 DIAGNOSIS — N184 Chronic kidney disease, stage 4 (severe): Secondary | ICD-10-CM | POA: Diagnosis not present

## 2023-05-26 DIAGNOSIS — R569 Unspecified convulsions: Secondary | ICD-10-CM

## 2023-05-26 NOTE — Assessment & Plan Note (Addendum)
Serially there had been some improvement in her CKD with creatinine 1.35 and EGFR of 39 but current creatinine is 1.88 and GFR 26 indicating return to stage IV CKD. Diabetic control has deteriorated with current A1c of 10.6 , up from  7.5%.  Glucose control is improving with an increase in the basal insulin to 45 units. Up-To-Date was reviewed in reference to dosages of Lamictal, Linzess, Keppra, and Requip with CKD.  It is recommended the Keppra dose be 500 mg twice a day max if her creatinine clearance is 15 to less than 30.  Requip dose will be recommended as 0.25 mg at bedtime.

## 2023-05-26 NOTE — Patient Instructions (Signed)
See assessment and plan under each diagnosis in the problem list and acutely for this visit. I shall contact Neurology to assess options for anti-seizure regimen in context of Stage 4 CKD. Insulin titration will continue to achieve A1c < 8%.

## 2023-05-26 NOTE — Assessment & Plan Note (Addendum)
Up To Date recommends maximum Keppra dose of 500 mg twice a day if creatinine clearance is 15 to less than 30.  Problematic is reports that she had a seizure when her Keppra was decreased from 1000 mg twice a day down to 750 mg twice a day because of a level of 80 with recommended therapeutic levels of less than 40.  This will be discussed with Neurology. Remotely she had been on Dilantin but she stopped this.  Neurology will be asked if restarting this is an option or if there are other interventions to consider.

## 2023-05-26 NOTE — Assessment & Plan Note (Addendum)
Today she describes numbness in her legs and inability to move to any significant degree rather than classic restless leg syndrome symptoms. Up To Date recommends Requip dose of 0.25 mg at bedtime with eGFR of 26.

## 2023-05-26 NOTE — Progress Notes (Signed)
NURSING HOME LOCATION:  [facility] ROOM NUMBER:  [id number]   CODE STATUS:  DNR  PCP:  Synthia Innocent NP  This is a nursing facility follow up visit of chronic medical diagnoses & to document compliance with Regulation 483.30 (c) in The Long Term Care Survey Manual Phase 2 which mandates caregiver visit ( visits can alternate among physician, PA or NP as per statutes) within 10 days of 30 days / 60 days/ 90 days post admission to SNF date    Interim medical record and care since last SNF visit was updated with review of diagnostic studies and change in clinical status since last visit were documented.  HPI: She is a permanent resident of this facility with medical diagnoses of adult failure to thrive syndrome, atherosclerosis of the aorta, cervical spine cord syndrome, diabetes with peripheral neuropathy, CKD stage III, GERD, dysphagia, history of gout, dyslipidemia, essential hypertension,  history of seizures,and history of major depressive disorder. She has had an anterior cervical decompression/discectomy fusion procedure in 2022.  Labs were completed in August.  A1c had risen to 10.6% , up from prior value of 7.5.  There was minimal hyponatremia with a value of 134.  There had been a significant change in her CKD with the creatinine rising from 1.35 up to 1.88 and eGFR dropping from 39-26 indicating CKD stage IV. Serially anemia has improved with the most recent values of 10.0/31.0 with normochromic, normocytic indices.  She had previously been on 1000 mg of Keppra twice a day but her level was 80 with therapeutic levels being less than 40.  She was decreased back to 750 mg twice a day; but she had apparent seizure.  Neurology saw her on 8/15 and increased the Keppra back to 1000 mg twice a day.  According to Up-To-Date if the creatinine clearance is 15 to less than 30 maximum Keppra dose would be 500 mg twice a day.  There are no such restrictions in reference to CKD with  Lamictal or Linzess ;although Requip dose is recommended to be 0.25 mg daily.Marland Kitchen Her basal insulin was increased to 45 units with some improvement in glucose control.  She remains on premeal 10 units of short acting insulin as well as Farxiga 10 mg.  Review of systems: Her chief complaint relates to the chronic left abdominal pain which she describes as "achy."  This is unchanged from when seen by GI in July.  Her other complaint is that she cannot move her body well especially her legs.  She describes numbness in the legs rather than restless leg syndrome symptoms.  She states that the staff have noted some blood intermittently on tissue when she is cleaned.  She is on iron supplement.  Constitutional: No fever, significant weight change Eyes: No redness, discharge, pain, vision change ENT/mouth: No nasal congestion,  purulent discharge, earache, change in hearing, sore throat  Cardiovascular: No chest pain, palpitations, paroxysmal nocturnal dyspnea, edema  Respiratory: No cough, sputum production, hemoptysis, DOE, significant snoring, apnea   Gastrointestinal: No heartburn, dysphagia,  nausea /vomiting,melena Genitourinary: No dysuria, hematuria, pyuria Musculoskeletal: No joint stiffness, joint swelling, weakness, pain Dermatologic: No rash, pruritus, change in appearance of skin Neurologic: No dizziness, headache, syncope, seizures Psychiatric: No significant anxiety, depression, insomnia, anorexia Endocrine: No change in hair/skin/nails, excessive thirst, excessive hunger, excessive urination  Hematologic/lymphatic: No significant bruising, lymphadenopathy Allergy/immunology: No itchy/watery eyes, significant sneezing, urticaria, angioedema  Physical exam:  Pertinent or positive findings: She was initially asleep exhibiting some  snoring and very brief apnea x 1.  Upon awakening she was slightly somnolent.  She wears only the upper plate.  Grade 1/2 systolic murmur is noted @ the base.   Second heart sound slightly accentuated.  She has minor low-grade rales over the right anterior chest.  There is marked central obesity.  There is tenderness to palpation and percussion in the left lower and upper quadrants.  Dorsalis pedis pulses are stronger than posterior tibial pulses.  She has trace pedal edema.  Foley catheter is present.  She has minimal movement in the lower extremities.  She can passively raise the upper extremities.  General appearance: Adequately nourished; no acute distress, increased work of breathing is present.   Lymphatic: No lymphadenopathy about the head, neck, axilla. Eyes: No conjunctival inflammation or lid edema is present. There is no scleral icterus. Ears:  External ear exam shows no significant lesions or deformities.   Nose:  External nasal examination shows no deformity or inflammation. Nasal mucosa are pink and moist without lesions, exudates Neck:  No thyromegaly, masses, tenderness noted.    Heart:  Normal rate and regular rhythm without gallop, click, rub .  Lungs: without wheezes, rhonchi, rubs. Abdomen: Bowel sounds are normal. Abdomen is soft and nontender with no organomegaly, hernias, masses. GU: Deferred  Extremities:  No cyanosis, clubbing  Skin: Warm & dry w/o tenting. No significant lesions or rash.  See summary under each active problem in the Problem List with associated updated therapeutic plan

## 2023-05-27 ENCOUNTER — Telehealth: Payer: Self-pay | Admitting: Adult Health

## 2023-05-27 MED ORDER — LAMOTRIGINE 25 MG PO TABS: ORAL_TABLET | ORAL | 0 refills | Status: DC

## 2023-05-27 NOTE — Telephone Encounter (Signed)
Received recent note from Dr. Alwyn Ren at [facility] with concerns of creatinine clearance and Keppra dosage.  Prior lab work 1 month ago with calculated creatinine clearance of 29 therefore dosage of keppra should not be greater than 500mg  BID, currently taking 1000mg  BID. Keppra dosage had previously been reduced by SNF based on keppra level to 750mg  BID but had breakthrough seizure activity therefore it was increased back to 1000mg  BID by ED. At prior visit, granddaughter reported recurrent persistent seizures despite increased Keppra dosage therefore added lamotrigine with gradual titration to 50 mg nightly in addition to Keppra.  Notified by SNF the patient had recurrent seizure on 8/23 therefore lamotrigine dosage further increased to 25/50.  Due to gradual decline in kidney function, would recommend gradually reducing Keppra dosage down to recommended 500 mg twice daily but will need to increase lamotrigine dosage first to reduce risk of breakthrough seizure (per up to date, no dosage restrictions on lamotrigine based on kidney function but advised to hydrate with caution due to possible change in half-life).   Recommend increasing lamotrigine to 50mg  twice daily for 2 weeks then increase to 75mg  twice daily for 2 weeks then increase to 100mg  twice daily. After 2 weeks at this dosage, would recommend decreasing keppra to 750mg  twice daily for 2 weeks then further reduce to 500mg  twice daily.  Lamotrigine dosage may need to be further adjusted with any breakthrough seizure, reducing Keppra dosage. Will place orders for lamotrigine reflecting these changes, will keep with lamotrigine 25 mg tablets, can transition to 100 mg tablets once at that dosage, will print and fax to [facility]. Will request [facility] contacts office once ready for new Keppra dosage to ensure dosage not reduced sooner than recommended.

## 2023-05-27 NOTE — Telephone Encounter (Signed)
Patient prescription was sent to [facility].

## 2023-05-27 NOTE — Addendum Note (Signed)
Addended byPecola Lawless on: 05/27/2023 10:19 AM   Modules accepted: Level of Service

## 2023-06-08 DIAGNOSIS — Z23 Encounter for immunization: Secondary | ICD-10-CM | POA: Diagnosis not present

## 2023-06-11 ENCOUNTER — Encounter: Payer: Self-pay | Admitting: Adult Health

## 2023-06-11 ENCOUNTER — Non-Acute Institutional Stay (SKILLED_NURSING_FACILITY): Payer: Medicare HMO | Admitting: Adult Health

## 2023-06-11 DIAGNOSIS — D638 Anemia in other chronic diseases classified elsewhere: Secondary | ICD-10-CM

## 2023-06-11 DIAGNOSIS — E1122 Type 2 diabetes mellitus with diabetic chronic kidney disease: Secondary | ICD-10-CM

## 2023-06-11 DIAGNOSIS — N184 Chronic kidney disease, stage 4 (severe): Secondary | ICD-10-CM

## 2023-06-11 DIAGNOSIS — E0869 Diabetes mellitus due to underlying condition with other specified complication: Secondary | ICD-10-CM | POA: Diagnosis not present

## 2023-06-11 DIAGNOSIS — I700 Atherosclerosis of aorta: Secondary | ICD-10-CM | POA: Diagnosis not present

## 2023-06-11 DIAGNOSIS — I129 Hypertensive chronic kidney disease with stage 1 through stage 4 chronic kidney disease, or unspecified chronic kidney disease: Secondary | ICD-10-CM | POA: Diagnosis not present

## 2023-06-11 NOTE — Progress Notes (Signed)
Location:  [facility] Room Number: [phone] Place of Service:  SNF (31)   CODE STATUS: DNR Managed care   Allergies  Allergen Reactions   Ace Inhibitors Other (See Comments)    Hyperkalemia    Codeine     Unknown reaction   Sulfa Antibiotics     Unknown reaction    Chief Complaint  Patient presents with   Acute Visit    Patient is being seen for care plan meeting     HPI:  We have come together for her care plan meeting. BIMS 14/15 mood 0/30. She does get out of bed to wheelchair without falls. She requires dependent assist with her adl care. She has a long term foley and is incontinent of bowel. Dietary: requires setup; weight is 188.6 pounds a gain of 6% in 90 days. Regular diet; appetite good. Therapy: none at this time. Activities: personal activities. She will continue to be followed for her chronic illnesses including: Aortic atherosclerosis   Hypertension associated with stage 4 chronic kidney disease due to type 2 diabetes mellitus    Anemia associated with diabetes mellitus due to underlying condition  Past Medical History:  Diagnosis Date   Acute cystitis without hematuria    Adult failure to thrive    Anemia    Anxiety    Atherosclerosis of aorta (HCC)    Central cord syndrome at C4 level of cervical spinal cord, subsequent encounter (HCC)    Chronic kidney disease, stage IV (severe) (HCC)    CKD (chronic kidney disease)    stage 3   Depression    DM type 2 with diabetic peripheral neuropathy (HCC)    Dysphagia    GERD (gastroesophageal reflux disease)    Gout    High cholesterol    HTN (hypertension)    Hyponatremia    MDD (major depressive disorder)    Neck pain    Neuropathy    Quadriplegia, C1-C4 incomplete (HCC)    Radiculopathy    RLS (restless legs syndrome)    Unspecified convulsions (HCC)    Urinary retention     Past Surgical History:  Procedure Laterality Date   ABDOMINAL HYSTERECTOMY     ANTERIOR CERVICAL  DECOMP/DISCECTOMY FUSION N/A 02/05/2021   Procedure: Cervical Three-Four  Anterior cervical decompression/discectomy/fusion;  Surgeon: Coletta Memos, MD;  Location: [facility] OR;  Service: Neurosurgery;  Laterality: N/A;  RM 20   APPENDECTOMY     BACK SURGERY     BALLOON DILATION N/A 11/19/2022   Procedure: BALLOON DILATION;  Surgeon: Lanelle Bal, DO;  Location: [facility];  Service: Endoscopy;  Laterality: N/A;   BIOPSY  09/22/2021   Procedure: BIOPSY;  Surgeon: Lanelle Bal, DO;  Location: [facility];  Service: Endoscopy;;   BIOPSY  11/19/2022   Procedure: BIOPSY;  Surgeon: Lanelle Bal, DO;  Location: [facility];  Service: Endoscopy;;   CERVICAL DISC SURGERY     ESOPHAGOGASTRODUODENOSCOPY (EGD) WITH PROPOFOL N/A 09/22/2021   Procedure: ESOPHAGOGASTRODUODENOSCOPY (EGD) WITH PROPOFOL;  Surgeon: Lanelle Bal, DO;  Location: [facility];  Service: Endoscopy;  Laterality: N/A;  1:00pm   ESOPHAGOGASTRODUODENOSCOPY (EGD) WITH PROPOFOL N/A 11/19/2022   Procedure: ESOPHAGOGASTRODUODENOSCOPY (EGD) WITH PROPOFOL;  Surgeon: Lanelle Bal, DO;  Location: [facility];  Service: Endoscopy;  Laterality: N/A;  11:30 am, asa 3   HAND SURGERY     KNEE SURGERY      Social History   Socioeconomic History   Marital  status: Widowed    Spouse name: Not on file   Number of children: Not on file   Years of education: Not on file   Highest education level: Not on file  Occupational History   Not on file  Tobacco Use   Smoking status: Never   Smokeless tobacco: Never  Vaping Use   Vaping status: Never Used  Substance and Sexual Activity   Alcohol use: Never   Drug use: Never   Sexual activity: Not Currently  Other Topics Concern   Not on file  Social History Narrative   Lives at [facility]   Social Determinants of Health   Financial Resource Strain: Not on file  Food Insecurity: No Food Insecurity (02/20/2023)   Hunger Vital Sign    Worried About Running Out of  Food in the Last Year: Never true    Ran Out of Food in the Last Year: Never true  Transportation Needs: No Transportation Needs (02/20/2023)   PRAPARE - Administrator, Civil Service (Medical): No    Lack of Transportation (Non-Medical): No  Physical Activity: Not on file  Stress: Not on file  Social Connections: Not on file  Intimate Partner Violence: Not At Risk (02/20/2023)   Humiliation, Afraid, Rape, and Kick questionnaire    Fear of Current or Ex-Partner: No    Emotionally Abused: No    Physically Abused: No    Sexually Abused: No   Family History  Problem Relation Age of Onset   Cancer Mother    Cardiomyopathy Father    Seizures Sister        childhood   Seizures Brother        in his 30s   Colon cancer Neg Hx       VITAL SIGNS BP (!) 152/66   Pulse 69   Temp (!) 97.2 F (36.2 C) (Temporal)   Resp 20   Ht 5\' 2"  (1.575 m)   Wt 187 lb (84.8 kg)   SpO2 96%   BMI 34.20 kg/m   Outpatient Encounter Medications as of 06/11/2023  Medication Sig   acetaminophen (TYLENOL) 325 MG tablet Take 650 mg by mouth 3 (three) times daily.   AMBULATORY NON FORMULARY MEDICATION Medication Name:  Continue monthly catheter changes with 65fr catheter at skilled nursing facility.   amLODipine (NORVASC) 5 MG tablet Take 5 mg by mouth daily.   apixaban (ELIQUIS) 5 MG TABS tablet Take 5 mg by mouth 2 (two) times daily.   augmented betamethasone dipropionate (DIPROLENE-AF) 0.05 % ointment Apply 1 Application topically 2 (two) times daily as needed. Apply to stomach and both breast   calcium-vitamin D (OSCAL WITH D) 500-5 MG-MCG tablet Take 1 tablet by mouth 2 (two) times daily.   camphor-menthol (SARNA) lotion Apply 1 Application topically 2 (two) times daily as needed for itching.   dapagliflozin propanediol (FARXIGA) 10 MG TABS tablet Take 10 mg by mouth daily.   DULoxetine (CYMBALTA) 20 MG capsule Take 20 mg by mouth daily.   feeding supplement (ENSURE ENLIVE / ENSURE PLUS)  LIQD Take 237 mLs by mouth daily.   ferrous sulfate 325 (65 FE) MG EC tablet Take 1 tablet (325 mg total) by mouth daily with breakfast.   fexofenadine (ALLEGRA) 60 MG tablet Take 60 mg by mouth daily.   gabapentin (NEURONTIN) 300 MG capsule Take 300 mg by mouth 3 (three) times daily.   insulin aspart (NOVOLOG FLEXPEN) 100 UNIT/ML FlexPen Inject 10 Units into the skin 3 (  three) times daily with meals. 8 am, 12 pm, and 6 pm   Insulin Pen Needle 30G X 5 MM MISC 1 Device by Does not apply route daily. 3/16"   ipratropium-albuterol (DUONEB) 0.5-2.5 (3) MG/3ML SOLN Take 3 mLs by nebulization every 6 (six) hours as needed (wheezing).   lamoTRIgine (LAMICTAL) 25 MG tablet 50 mg BID for 2 weeks then increase to 75mg  BID for 2 weeks then increase to 100mg  BID   LANTUS SOLOSTAR 100 UNIT/ML Solostar Pen Inject 45 Units into the skin at bedtime.   levETIRAcetam (KEPPRA) 1000 MG tablet Take 1 tablet (1,000 mg total) by mouth 2 (two) times daily.   linaclotide (LINZESS) 145 MCG CAPS capsule Take 145 mcg by mouth daily as needed.   LORazepam (ATIVAN) 2 MG/ML injection Inject 0.5 mLs (1 mg total) into the muscle every 15 (fifteen) minutes as needed.   melatonin 5 MG TABS Take 5 mg by mouth at bedtime.   Multiple Vitamins-Minerals (THEREMS M PO) Take 1 tablet by mouth daily. (multivitamin with folic acid) tablet; 400 mcg;   NON FORMULARY Diet - Regular   omeprazole (PRILOSEC) 40 MG capsule Take 1 capsule (40 mg total) by mouth in the morning and at bedtime.   Phenylephrine-Cocoa Butter 0.25-85.5 % SUPP Place 1 suppository rectally 2 (two) times daily as needed.   rOPINIRole (REQUIP) 1 MG tablet Take 1 mg by mouth at bedtime.   rosuvastatin (CRESTOR) 20 MG tablet Take 20 mg by mouth every evening.   sodium bicarbonate 650 MG tablet Take 1 tablet (650 mg total) by mouth 2 (two) times daily.   tiZANidine (ZANAFLEX) 2 MG tablet Take 2 mg by mouth every 6 (six) hours as needed for muscle spasms (for back and sciatic  pain).   zinc oxide 20 % ointment Apply 1 Application topically See admin instructions. Apply every shift to bilateral buttocks, sacrum, and coccyx   No facility-administered encounter medications on file as of 06/11/2023.     SIGNIFICANT DIAGNOSTIC EXAMS  PREVIOUS   04-15-22; dexa: t score -3.391  NO NEW EXAMS    LABS REVIEWED PREVIOUS   07-09-22: wbc 9.3; hgb 8.5; hct 26.9; mcv 96.8 plt 196; glucose 200; bun 65; creat 2.15; k+ 5.0; na++ 128; ca 7.8; gfr 22 07-10-22: glucose 130; bun 59; creat 1.75; k+ 4.2; na++ 131; ca 7.6; gfr 29 07-30-22: urine micro-albumin 59.7; ACR 127   08-20-22: hgb A1c 8.8 10-01-22: wbc 5.6; hgb 9.6; hct 29.9; mcv 94.9 plt 245; glucose 404; bun 43; creat 1.69; k+ 5.2; na++ 135; ca 8.2 gfr 30 protein 7.2 albumin 3.5 10-19-22: glucose 176; bun 45; creat 1.63; k+ 5.8; na++ 134; ca 8.9; gfr 31 10-31-22: wbc 4.6; hgb 8.4; hct 26.5; mcv 94.0 plt 183; glucose 93; bun 46; creat 1.64; k+ 5.2; na++ 134; ca 8.6; gfr 31; protein 6.5 albumin 3.2  11-05-22: glucose 117; bun 35; creat 1.25; k+ 4.3;na++ 137; ca 7.5; gfr 43; hgb A1c 8.5; vitamin D 26.12; chol 149; ldl 73 trig 166; hdl 43  01-25-23: wbc 4.5; hgb 8.8; hct 27.0; mcv 94.7 plt 178; glucose 163; bun 45; creat 1.58; k+ 5.8; na++ 134; ca 8.7; gfr 32; protein 6.6 albumin 3.4 iron 54; tibc 305; vitamin B12: 496; keppra 80.7 (normal 10-40) 01-26-23: glucose 116; bun 41; creat 1.54; k+ 5.5; na++ 133; ca 8.5 gfr 33   02-08-23: keppra 41.6 (10-40) 02-18-23: hgb A1c 7.5; tsh 5.113 02-19-23: wbc 6.0; hgb 7.9; hct 24.7; mcv 95.7 plt 202; glucose 225; bun  45; creat 1.76; k+ 6.4; na++ 130; ca 8.7; gfr 28; (repeat k+ 5.4) 02-20-23: wbc 6.2; hgb 7.5; hct 24.1; mcv 95.6 plt 186;glucose 195; bun 45; creat 1.76; k+ 5.6; na++ 134; ca 8.5 gfr 28; protein 6.2; albumin 3.1 free t4: 0.80; mag 1.8 02-21-23: vitamin B12 781; folate 26.1; iron 26; tibc 266; ferritin 139 03-23-23: wbc 6.3; hgb 10.0; hct 31.0; mcv 92.8 plt 186; glucose 239; bun 41; creat  1.35; k+ 4.6; na++ 135; ca 8.7; gfr 39; protein 7.6 albumin 3.6  04-05-23: glucose 215; bun 49; creat 1.88; k+ 4.3; na++ 134; ca 8.4; gfr 26 04-29-23: hgb A1c 10.2    NO NEW LABS.     Review of Systems  Constitutional:  Negative for malaise/fatigue.  Respiratory:  Negative for cough and shortness of breath.   Cardiovascular:  Negative for chest pain, palpitations and leg swelling.  Gastrointestinal:  Negative for abdominal pain, constipation and heartburn.  Musculoskeletal:  Negative for back pain, joint pain and myalgias.  Skin: Negative.   Neurological:  Negative for dizziness.  Psychiatric/Behavioral:  The patient is not nervous/anxious.     Physical Exam Constitutional:      General: She is not in acute distress.    Appearance: She is well-developed. She is obese. She is not diaphoretic.  Neck:     Thyroid: No thyromegaly.  Cardiovascular:     Rate and Rhythm: Normal rate and regular rhythm.     Heart sounds: Normal heart sounds.  Pulmonary:     Effort: Pulmonary effort is normal. No respiratory distress.     Breath sounds: Normal breath sounds.  Abdominal:     General: Bowel sounds are normal. There is no distension.     Palpations: Abdomen is soft.     Tenderness: There is no abdominal tenderness.  Genitourinary:    Comments: foley Musculoskeletal:     Cervical back: Neck supple.     Right lower leg: No edema.     Left lower leg: No edema.     Comments: Limited range of motion in upper extremities Does not move lower extremities      Lymphadenopathy:     Cervical: No cervical adenopathy.  Skin:    General: Skin is warm and dry.  Neurological:     Mental Status: She is alert. Mental status is at baseline.     Comments: 03-22-23: SLUMS 14/30     Psychiatric:        Mood and Affect: Mood normal.       ASSESSMENT/ PLAN:  TODAY  Aortic atherosclerosis Hypertension associated with stage 4 chronic kidney disease due to type 2 diabetes mellitus  Anemia  associated with diabetes mellitus due to underlying condition  Will continue current medications Will continue current plan of care Will continue to monitor her status  Time spent with patient: 40 minutes: medications; plan of care; dietary    Synthia Innocent NP [facility]   call [phone]

## 2023-06-21 DIAGNOSIS — Z23 Encounter for immunization: Secondary | ICD-10-CM | POA: Diagnosis not present

## 2023-06-23 ENCOUNTER — Encounter: Payer: Self-pay | Admitting: Adult Health

## 2023-06-23 ENCOUNTER — Non-Acute Institutional Stay (SKILLED_NURSING_FACILITY): Payer: Self-pay | Admitting: Adult Health

## 2023-06-23 DIAGNOSIS — E785 Hyperlipidemia, unspecified: Secondary | ICD-10-CM

## 2023-06-23 DIAGNOSIS — M810 Age-related osteoporosis without current pathological fracture: Secondary | ICD-10-CM

## 2023-06-23 DIAGNOSIS — E1169 Type 2 diabetes mellitus with other specified complication: Secondary | ICD-10-CM | POA: Diagnosis not present

## 2023-06-23 DIAGNOSIS — K219 Gastro-esophageal reflux disease without esophagitis: Secondary | ICD-10-CM

## 2023-06-23 DIAGNOSIS — K5909 Other constipation: Secondary | ICD-10-CM | POA: Diagnosis not present

## 2023-06-23 NOTE — Progress Notes (Signed)
Location:  [facility] Room Number: 154 Place of Service:  SNF (31)   CODE STATUS: dnr  Allergies  Allergen Reactions   Ace Inhibitors Other (See Comments)    Hyperkalemia    Codeine     Unknown reaction   Sulfa Antibiotics     Unknown reaction    Chief Complaint  Patient presents with   Medical Management of Chronic Issues         Hyperlipidemia associated with type 2 diabetes mellitus:   Post menopausal osteoporosis:      GERD without esophagitis:  Chronic constipation     HPI:  She is a 87 year old long term resident of this facility being seen for the management of her chronic illnesses:  Hyperlipidemia associated with type 2 diabetes mellitus:   Post menopausal osteoporosis:      GERD without esophagitis:  Chronic constipation. There are no reports of heart burn or constipation.   Past Medical History:  Diagnosis Date   Acute cystitis without hematuria    Adult failure to thrive    Anemia    Anxiety    Atherosclerosis of aorta (HCC)    Central cord syndrome at C4 level of cervical spinal cord, subsequent encounter (HCC)    Chronic kidney disease, stage IV (severe) (HCC)    CKD (chronic kidney disease)    stage 3   Depression    DM type 2 with diabetic peripheral neuropathy (HCC)    Dysphagia    GERD (gastroesophageal reflux disease)    Gout    High cholesterol    HTN (hypertension)    Hyponatremia    MDD (major depressive disorder)    Neck pain    Neuropathy    Quadriplegia, C1-C4 incomplete (HCC)    Radiculopathy    RLS (restless legs syndrome)    Unspecified convulsions (HCC)    Urinary retention     Past Surgical History:  Procedure Laterality Date   ABDOMINAL HYSTERECTOMY     ANTERIOR CERVICAL DECOMP/DISCECTOMY FUSION N/A 02/05/2021   Procedure: Cervical Three-Four  Anterior cervical decompression/discectomy/fusion;  Surgeon: Coletta Memos, MD;  Location: [facility] OR;  Service: Neurosurgery;  Laterality: N/A;  RM 20    APPENDECTOMY     BACK SURGERY     BALLOON DILATION N/A 11/19/2022   Procedure: BALLOON DILATION;  Surgeon: Lanelle Bal, DO;  [facility]: [facility];  Service: Endoscopy;  Laterality: N/A;   BIOPSY  09/22/2021   Procedure: BIOPSY;  Surgeon: Lanelle Bal, DO;  [facility];  Service: Endoscopy;;   BIOPSY  11/19/2022   Procedure: BIOPSY;  Surgeon: Lanelle Bal, DO;  [facility]: [facility];  Service: Endoscopy;;   CERVICAL DISC SURGERY     ESOPHAGOGASTRODUODENOSCOPY (EGD) WITH PROPOFOL N/A 09/22/2021   Procedure: ESOPHAGOGASTRODUODENOSCOPY (EGD) WITH PROPOFOL;  Surgeon: Lanelle Bal, DO;  [facility]: [facility];  Service: Endoscopy;  Laterality: N/A;  1:00pm   ESOPHAGOGASTRODUODENOSCOPY (EGD) WITH PROPOFOL N/A 11/19/2022   Procedure: ESOPHAGOGASTRODUODENOSCOPY (EGD) WITH PROPOFOL;  Surgeon: Lanelle Bal, DO;  [facility]: [facility];  Service: Endoscopy;  Laterality: N/A;  11:30 am, asa 3   HAND SURGERY     KNEE SURGERY      Social History   Socioeconomic History   Marital status: Widowed    Spouse name: Not on file   Number of children: Not on file   Years of education: Not on file   Highest education level: Not on  file  Occupational History   Not on file  Tobacco Use   Smoking status: Never   Smokeless tobacco: Never  Vaping Use   Vaping status: Never Used  Substance and Sexual Activity   Alcohol use: Never   Drug use: Never   Sexual activity: Not Currently  Other Topics Concern   Not on file  Social History Narrative   Lives at [facility]   Social Determinants of Health   Financial Resource Strain: Not on file  Food Insecurity: No Food Insecurity (02/20/2023)   Hunger Vital Sign    Worried About Running Out of Food in the Last Year: Never true    Ran Out of Food in the Last Year: Never true  Transportation Needs: No Transportation Needs (02/20/2023)   PRAPARE - Administrator, Civil Service (Medical): No    Lack of  Transportation (Non-Medical): No  Physical Activity: Not on file  Stress: Not on file  Social Connections: Not on file  Intimate Partner Violence: Not At Risk (02/20/2023)   Humiliation, Afraid, Rape, and Kick questionnaire    Fear of Current or Ex-Partner: No    Emotionally Abused: No    Physically Abused: No    Sexually Abused: No   Family History  Problem Relation Age of Onset   Cancer Mother    Cardiomyopathy Father    Seizures Sister        childhood   Seizures Brother        in his 54s   Colon cancer Neg Hx       VITAL SIGNS BP (!) 147/62   Pulse 66   Temp (!) 97.1 F (36.2 C)   Resp 16   Ht 5\' 2"  (1.575 m)   Wt 187 lb (84.8 kg)   SpO2 96%   BMI 34.20 kg/m   Outpatient Encounter Medications as of 06/23/2023  Medication Sig   amLODipine (NORVASC) 10 MG tablet Take 10 mg by mouth daily.   levETIRAcetam (KEPPRA) 500 MG tablet Take 500 mg by mouth 2 (two) times daily. Renal dose   omeprazole (PRILOSEC) 40 MG capsule Take 40 mg by mouth daily.   acetaminophen (TYLENOL) 325 MG tablet Take 650 mg by mouth 3 (three) times daily.   AMBULATORY NON FORMULARY MEDICATION Medication Name:  Continue monthly catheter changes with 45fr catheter at skilled nursing facility.   apixaban (ELIQUIS) 5 MG TABS tablet Take 5 mg by mouth 2 (two) times daily.   augmented betamethasone dipropionate (DIPROLENE-AF) 0.05 % ointment Apply 1 Application topically 2 (two) times daily as needed. Apply to stomach and both breast   calcium-vitamin D (OSCAL WITH D) 500-5 MG-MCG tablet Take 1 tablet by mouth 2 (two) times daily.   camphor-menthol (SARNA) lotion Apply 1 Application topically 2 (two) times daily as needed for itching.   dapagliflozin propanediol (FARXIGA) 10 MG TABS tablet Take 10 mg by mouth daily.   DULoxetine (CYMBALTA) 20 MG capsule Take 20 mg by mouth daily.   feeding supplement (ENSURE ENLIVE / ENSURE PLUS) LIQD Take 237 mLs by mouth daily.   ferrous sulfate 325 (65 FE) MG EC  tablet Take 1 tablet (325 mg total) by mouth daily with breakfast.   fexofenadine (ALLEGRA) 60 MG tablet Take 60 mg by mouth daily.   gabapentin (NEURONTIN) 300 MG capsule Take 300 mg by mouth 3 (three) times daily.   insulin aspart (NOVOLOG FLEXPEN) 100 UNIT/ML FlexPen Inject 10 Units into the skin 3 (three) times daily  with meals. 8 am, 12 pm, and 6 pm   Insulin Pen Needle 30G X 5 MM MISC 1 Device by Does not apply route daily. 3/16"   ipratropium-albuterol (DUONEB) 0.5-2.5 (3) MG/3ML SOLN Take 3 mLs by nebulization every 6 (six) hours as needed (wheezing).   lamoTRIgine (LAMICTAL) 25 MG tablet 50 mg BID for 2 weeks then increase to 75mg  BID for 2 weeks then increase to 100mg  BID   LANTUS SOLOSTAR 100 UNIT/ML Solostar Pen Inject 45 Units into the skin at bedtime.   linaclotide (LINZESS) 145 MCG CAPS capsule Take 145 mcg by mouth daily as needed.   LORazepam (ATIVAN) 2 MG/ML injection Inject 0.5 mLs (1 mg total) into the muscle every 15 (fifteen) minutes as needed.   melatonin 5 MG TABS Take 5 mg by mouth at bedtime.   Multiple Vitamins-Minerals (THEREMS M PO) Take 1 tablet by mouth daily. (multivitamin with folic acid) tablet; 400 mcg;   NON FORMULARY Diet - Regular   Phenylephrine-Cocoa Butter 0.25-85.5 % SUPP Place 1 suppository rectally 2 (two) times daily as needed.   rOPINIRole (REQUIP) 1 MG tablet Take 1 mg by mouth at bedtime.   rosuvastatin (CRESTOR) 20 MG tablet Take 20 mg by mouth every evening.   sodium bicarbonate 650 MG tablet Take 1 tablet (650 mg total) by mouth 2 (two) times daily.   tiZANidine (ZANAFLEX) 2 MG tablet Take 2 mg by mouth every 6 (six) hours as needed for muscle spasms (for back and sciatic pain).   zinc oxide 20 % ointment Apply 1 Application topically See admin instructions. Apply every shift to bilateral buttocks, sacrum, and coccyx   [DISCONTINUED] amLODipine (NORVASC) 5 MG tablet Take 5 mg by mouth daily.   [DISCONTINUED] levETIRAcetam (KEPPRA) 1000 MG tablet  Take 1 tablet (1,000 mg total) by mouth 2 (two) times daily.   [DISCONTINUED] omeprazole (PRILOSEC) 40 MG capsule Take 1 capsule (40 mg total) by mouth in the morning and at bedtime. (Patient taking differently: Take 40 mg by mouth daily.)   No facility-administered encounter medications on file as of 06/23/2023.     SIGNIFICANT DIAGNOSTIC EXAMS  PREVIOUS   04-15-22; dexa: t score -3.391  NO NEW EXAMS    LABS REVIEWED PREVIOUS   07-09-22: wbc 9.3; hgb 8.5; hct 26.9; mcv 96.8 plt 196; glucose 200; bun 65; creat 2.15; k+ 5.0; na++ 128; ca 7.8; gfr 22 07-10-22: glucose 130; bun 59; creat 1.75; k+ 4.2; na++ 131; ca 7.6; gfr 29 07-30-22: urine micro-albumin 59.7; ACR 127   08-20-22: hgb A1c 8.8 10-01-22: wbc 5.6; hgb 9.6; hct 29.9; mcv 94.9 plt 245; glucose 404; bun 43; creat 1.69; k+ 5.2; na++ 135; ca 8.2 gfr 30 protein 7.2 albumin 3.5 10-19-22: glucose 176; bun 45; creat 1.63; k+ 5.8; na++ 134; ca 8.9; gfr 31 10-31-22: wbc 4.6; hgb 8.4; hct 26.5; mcv 94.0 plt 183; glucose 93; bun 46; creat 1.64; k+ 5.2; na++ 134; ca 8.6; gfr 31; protein 6.5 albumin 3.2  11-05-22: glucose 117; bun 35; creat 1.25; k+ 4.3;na++ 137; ca 7.5; gfr 43; hgb A1c 8.5; vitamin D 26.12; chol 149; ldl 73 trig 166; hdl 43  01-25-23: wbc 4.5; hgb 8.8; hct 27.0; mcv 94.7 plt 178; glucose 163; bun 45; creat 1.58; k+ 5.8; na++ 134; ca 8.7; gfr 32; protein 6.6 albumin 3.4 iron 54; tibc 305; vitamin B12: 496; keppra 80.7 (normal 10-40) 01-26-23: glucose 116; bun 41; creat 1.54; k+ 5.5; na++ 133; ca 8.5 gfr 33   02-08-23: keppra  41.6 (10-40) 02-18-23: hgb A1c 7.5; tsh 5.113 02-19-23: wbc 6.0; hgb 7.9; hct 24.7; mcv 95.7 plt 202; glucose 225; bun 45; creat 1.76; k+ 6.4; na++ 130; ca 8.7; gfr 28; (repeat k+ 5.4) 02-20-23: wbc 6.2; hgb 7.5; hct 24.1; mcv 95.6 plt 186;glucose 195; bun 45; creat 1.76; k+ 5.6; na++ 134; ca 8.5 gfr 28; protein 6.2; albumin 3.1 free t4: 0.80; mag 1.8 02-21-23: vitamin B12 781; folate 26.1; iron 26; tibc 266; ferritin  139 03-23-23: wbc 6.3; hgb 10.0; hct 31.0; mcv 92.8 plt 186; glucose 239; bun 41; creat 1.35; k+ 4.6; na++ 135; ca 8.7; gfr 39; protein 7.6 albumin 3.6  04-05-23: glucose 215; bun 49; creat 1.88; k+ 4.3; na++ 134; ca 8.4; gfr 26 04-29-23: hgb A1c 10.2    NO NEW LABS.     Review of Systems  Constitutional:  Negative for malaise/fatigue.  Respiratory:  Negative for cough and shortness of breath.   Cardiovascular:  Negative for chest pain, palpitations and leg swelling.  Gastrointestinal:  Negative for abdominal pain, constipation and heartburn.  Musculoskeletal:  Negative for back pain, joint pain and myalgias.  Skin: Negative.   Neurological:  Negative for dizziness.  Psychiatric/Behavioral:  The patient is not nervous/anxious.     Physical Exam Constitutional:      General: She is not in acute distress.    Appearance: She is well-developed. She is obese. She is not diaphoretic.  Neck:     Thyroid: No thyromegaly.  Cardiovascular:     Rate and Rhythm: Normal rate and regular rhythm.     Pulses: Normal pulses.     Heart sounds: Normal heart sounds.  Pulmonary:     Effort: Pulmonary effort is normal. No respiratory distress.     Breath sounds: Normal breath sounds.  Abdominal:     General: Bowel sounds are normal. There is no distension.     Palpations: Abdomen is soft.     Tenderness: There is no abdominal tenderness.  Genitourinary:    Comments: foley Musculoskeletal:     Cervical back: Neck supple.     Right lower leg: No edema.     Left lower leg: No edema.     Comments:  Limited range of motion in upper extremities Does not move lower extremities       Lymphadenopathy:     Cervical: No cervical adenopathy.  Skin:    General: Skin is warm and dry.  Neurological:     Mental Status: She is alert. Mental status is at baseline.     Comments: 03-22-23: SLUMS 14/30   Psychiatric:        Mood and Affect: Mood normal.      ASSESSMENT/ PLAN:  TODAY  Hyperlipidemia  associated with type 2 diabetes mellitus: ldl 73 will continue crestor 20 mg daily will check lipids  2. Post menopausal osteoporosis: t score -3.391   3. GERD without esophagitis: will lower prilosec to 40 mg daily; is off reglan   4. Chronic constipation: will continue miralax daily and linzess 145 mcg daily as needed  PREVIOUS   5. Lumbar radicular syndrome/vascular necrosis bilateral hips: will continue tylenol 650 mg every 8 hours; gabapentin 300 mg three times daily and cymbalta 20 mg daily   6. Diabetic peripheral neuropathy: will continue gabapentin 300 mg three times daily   7. CKD stage 4 due to type 2 diabetes mellitus: bun 49; creat 1.88 gfr 26  8. Anemia associated with type 2 diabetes mellitus due to underlying condition:  hgb 8.5 will monitor   9. Urine retention/neurogenic bladder: has long term foley; followed by urology   10. Chronic anxiety: is off medications  11. Protein calorie malnutrition severe: albumin 3.6; protein 6.7 will continue supplements as indicated  12. Aortic atherosclerosis (ct 01-26-21) is on asa and statin   13. Central cord syndrome at C4 level of cervical spine compression subsequent encounter/cord compression quadriplegia C1-4 (01-25-21) asa 81 mg daily; zanaflex 2 mg every 6 hours as needed for spasticity   14. Neurocognitive deficits: 03-22-23: SLUMS 14/30.   15. Restless leg syndrome: will continue requip 1 mg nightly   16. Hypertension associated with stage 4 chronic kidney disease due to type 2 diabetes mellitus: 147/62 will increase norvasc to 10 mg daily   17. Seizure; will continue keppra 500 mg twice daily did not tolerate lower dose. Will continue lamictal 25 mg in the AM and 50 mg in the PM; neurology is aware  18. Type 2 diabetes mellitus with neuropathy with long term current use of insulin: hgb A1c 10.2; will continue farxiga 10 mg daily;  lantus 45 units nightly; novolog 10 units with meals;      Synthia Innocent NP [facility]  Adult Medicine  call [phone]

## 2023-06-24 ENCOUNTER — Other Ambulatory Visit (HOSPITAL_COMMUNITY)
Admission: RE | Admit: 2023-06-24 | Discharge: 2023-06-24 | Disposition: A | Discharge status: Home / Self Care | Payer: Medicare HMO | Source: Skilled Nursing Facility | Attending: Adult Health | Admitting: Adult Health

## 2023-06-24 ENCOUNTER — Encounter: Payer: Self-pay | Admitting: Adult Health

## 2023-06-24 DIAGNOSIS — E785 Hyperlipidemia, unspecified: Secondary | ICD-10-CM | POA: Insufficient documentation

## 2023-06-24 LAB — LIPID PANEL
Cholesterol: 140 mg/dL (ref 0–200)
HDL: 38 mg/dL — ABNORMAL LOW (ref >40)
LDL Cholesterol: 64 mg/dL (ref 0–99)
Total CHOL/HDL Ratio: 3.7 {ratio}
Triglycerides: 190 mg/dL — ABNORMAL HIGH (ref <150)
VLDL: 38 mg/dL (ref 0–40)

## 2023-06-24 NOTE — Progress Notes (Signed)
Location:  [facility] Room Number: 154 Place of Service:  SNF (31)   CODE STATUS:   Allergies  Allergen Reactions   Ace Inhibitors Other (See Comments)    Hyperkalemia    Codeine     Unknown reaction   Sulfa Antibiotics     Unknown reaction    Chief Complaint  Patient presents with   Acute Visit    Lab follow up    Error    HPI:    Past Medical History:  Diagnosis Date   Acute cystitis without hematuria    Adult failure to thrive    Anemia    Anxiety    Atherosclerosis of aorta (HCC)    Central cord syndrome at C4 level of cervical spinal cord, subsequent encounter (HCC)    Chronic kidney disease, stage IV (severe) (HCC)    CKD (chronic kidney disease)    stage 3   Depression    DM type 2 with diabetic peripheral neuropathy (HCC)    Dysphagia    GERD (gastroesophageal reflux disease)    Gout    High cholesterol    HTN (hypertension)    Hyponatremia    MDD (major depressive disorder)    Neck pain    Neuropathy    Quadriplegia, C1-C4 incomplete (HCC)    Radiculopathy    RLS (restless legs syndrome)    Unspecified convulsions (HCC)    Urinary retention     Past Surgical History:  Procedure Laterality Date   ABDOMINAL HYSTERECTOMY     ANTERIOR CERVICAL DECOMP/DISCECTOMY FUSION N/A 02/05/2021   Procedure: Cervical Three-Four  Anterior cervical decompression/discectomy/fusion;  Surgeon: Coletta Memos, MD;  Location: [facility] OR;  Service: Neurosurgery;  Laterality: N/A;  RM 20   APPENDECTOMY     BACK SURGERY     BALLOON DILATION N/A 11/19/2022   Procedure: BALLOON DILATION;  Surgeon: Lanelle Bal, DO;  [facility];  Service: Endoscopy;  Laterality: N/A;   BIOPSY  09/22/2021   Procedure: BIOPSY;  Surgeon: Lanelle Bal, DO;  [facility];  Service: Endoscopy;;   BIOPSY  11/19/2022   Procedure: BIOPSY;  Surgeon: Lanelle Bal, DO;  [facility];  Service: Endoscopy;;   CERVICAL DISC SURGERY      ESOPHAGOGASTRODUODENOSCOPY (EGD) WITH PROPOFOL N/A 09/22/2021   Procedure: ESOPHAGOGASTRODUODENOSCOPY (EGD) WITH PROPOFOL;  Surgeon: Lanelle Bal, DO;  [facility];  Service: Endoscopy;  Laterality: N/A;  1:00pm   ESOPHAGOGASTRODUODENOSCOPY (EGD) WITH PROPOFOL N/A 11/19/2022   Procedure: ESOPHAGOGASTRODUODENOSCOPY (EGD) WITH PROPOFOL;  Surgeon: Lanelle Bal, DO;  [facility];  Service: Endoscopy;  Laterality: N/A;  11:30 am, asa 3   HAND SURGERY     KNEE SURGERY      Social History   Socioeconomic History   Marital status: Widowed    Spouse name: Not on file   Number of children: Not on file   Years of education: Not on file   Highest education level: Not on file  Occupational History   Not on file  Tobacco Use   Smoking status: Never   Smokeless tobacco: Never  Vaping Use   Vaping status: Never Used  Substance and Sexual Activity   Alcohol use: Never   Drug use: Never   Sexual activity: Not Currently  Other Topics Concern   Not on file  Social History Narrative   Lives at [facility]   Social Determinants of Health  Financial Resource Strain: Not on file  Food Insecurity: No Food Insecurity (02/20/2023)   Hunger Vital Sign    Worried About Running Out of Food in the Last Year: Never true    Ran Out of Food in the Last Year: Never true  Transportation Needs: No Transportation Needs (02/20/2023)   PRAPARE - Administrator, Civil Service (Medical): No    Lack of Transportation (Non-Medical): No  Physical Activity: Not on file  Stress: Not on file  Social Connections: Not on file  Intimate Partner Violence: Not At Risk (02/20/2023)   Humiliation, Afraid, Rape, and Kick questionnaire    Fear of Current or Ex-Partner: No    Emotionally Abused: No    Physically Abused: No    Sexually Abused: No   Family History  Problem Relation Age of Onset   Cancer Mother    Cardiomyopathy Father    Seizures Sister         childhood   Seizures Brother        in his 16s   Colon cancer Neg Hx       VITAL SIGNS BP 138/64   Pulse 66   Temp (!) 97.1 F (36.2 C)   Resp 16   Ht 5\' 2"  (1.575 m)   Wt 187 lb (84.8 kg)   SpO2 96%   BMI 34.20 kg/m   Outpatient Encounter Medications as of 06/24/2023  Medication Sig   acetaminophen (TYLENOL) 325 MG tablet Take 650 mg by mouth 3 (three) times daily.   AMBULATORY NON FORMULARY MEDICATION Medication Name:  Continue monthly catheter changes with 40fr catheter at skilled nursing facility.   amLODipine (NORVASC) 10 MG tablet Take 10 mg by mouth daily.   apixaban (ELIQUIS) 5 MG TABS tablet Take 5 mg by mouth 2 (two) times daily.   augmented betamethasone dipropionate (DIPROLENE-AF) 0.05 % ointment Apply 1 Application topically 2 (two) times daily as needed. Apply to stomach and both breast   calcium-vitamin D (OSCAL WITH D) 500-5 MG-MCG tablet Take 1 tablet by mouth 2 (two) times daily.   camphor-menthol (SARNA) lotion Apply 1 Application topically 2 (two) times daily as needed for itching.   dapagliflozin propanediol (FARXIGA) 10 MG TABS tablet Take 10 mg by mouth daily.   DULoxetine (CYMBALTA) 20 MG capsule Take 20 mg by mouth daily.   feeding supplement (ENSURE ENLIVE / ENSURE PLUS) LIQD Take 237 mLs by mouth daily.   ferrous sulfate 325 (65 FE) MG EC tablet Take 1 tablet (325 mg total) by mouth daily with breakfast.   fexofenadine (ALLEGRA) 60 MG tablet Take 60 mg by mouth daily.   gabapentin (NEURONTIN) 300 MG capsule Take 300 mg by mouth 3 (three) times daily.   insulin aspart (NOVOLOG FLEXPEN) 100 UNIT/ML FlexPen Inject 10 Units into the skin 3 (three) times daily with meals. 8 am, 12 pm, and 6 pm   Insulin Pen Needle 30G X 5 MM MISC 1 Device by Does not apply route daily. 3/16"   ipratropium-albuterol (DUONEB) 0.5-2.5 (3) MG/3ML SOLN Take 3 mLs by nebulization every 6 (six) hours as needed (wheezing).   lamoTRIgine (LAMICTAL) 25 MG tablet 50 mg BID for 2  weeks then increase to 75mg  BID for 2 weeks then increase to 100mg  BID   LANTUS SOLOSTAR 100 UNIT/ML Solostar Pen Inject 45 Units into the skin at bedtime.   levETIRAcetam (KEPPRA) 500 MG tablet Take 500 mg by mouth 2 (two) times daily. Renal dose   linaclotide [name])  145 MCG CAPS capsule Take 145 mcg by mouth daily as needed.   LORazepam (ATIVAN) 2 MG/ML injection Inject 0.5 mLs (1 mg total) into the muscle every 15 (fifteen) minutes as needed.   melatonin 5 MG TABS Take 5 mg by mouth at bedtime.   Multiple Vitamins-Minerals (THEREMS M PO) Take 1 tablet by mouth daily. (multivitamin with folic acid) tablet; 400 mcg;   NON FORMULARY Diet - Regular   omeprazole (PRILOSEC) 40 MG capsule Take 40 mg by mouth daily.   Phenylephrine-Cocoa Butter 0.25-85.5 % SUPP Place 1 suppository rectally 2 (two) times daily as needed.   rOPINIRole (REQUIP) 1 MG tablet Take 1 mg by mouth at bedtime.   rosuvastatin (CRESTOR) 20 MG tablet Take 20 mg by mouth every evening.   sodium bicarbonate 650 MG tablet Take 1 tablet (650 mg total) by mouth 2 (two) times daily.   tiZANidine (ZANAFLEX) 2 MG tablet Take 2 mg by mouth every 6 (six) hours as needed for muscle spasms (for back and sciatic pain).   zinc oxide 20 % ointment Apply 1 Application topically See admin instructions. Apply every shift to bilateral buttocks, sacrum, and coccyx   No facility-administered encounter medications on file as of 06/24/2023.     SIGNIFICANT DIAGNOSTIC EXAMS       ASSESSMENT/ PLAN:     Synthia Innocent NP [facility] Adult Medicine   call [phone]   This encounter was created in error - please disregard.

## 2023-06-28 ENCOUNTER — Encounter: Payer: Self-pay | Admitting: Adult Health

## 2023-06-28 ENCOUNTER — Other Ambulatory Visit (HOSPITAL_COMMUNITY)
Admission: RE | Admit: 2023-06-28 | Discharge: 2023-06-28 | Disposition: A | Discharge status: Home / Self Care | Payer: Medicare HMO | Source: Skilled Nursing Facility | Attending: Adult Health | Admitting: Adult Health

## 2023-06-28 ENCOUNTER — Non-Acute Institutional Stay (SKILLED_NURSING_FACILITY): Payer: Self-pay | Admitting: Adult Health

## 2023-06-28 DIAGNOSIS — Z23 Encounter for immunization: Secondary | ICD-10-CM | POA: Diagnosis not present

## 2023-06-28 DIAGNOSIS — N319 Neuromuscular dysfunction of bladder, unspecified: Secondary | ICD-10-CM | POA: Diagnosis not present

## 2023-06-28 DIAGNOSIS — R339 Retention of urine, unspecified: Secondary | ICD-10-CM

## 2023-06-28 LAB — URINALYSIS, W/ REFLEX TO CULTURE (INFECTION SUSPECTED)
Bilirubin Urine: NEGATIVE
Glucose, UA: 50 mg/dL — AB
Ketones, ur: NEGATIVE mg/dL
Nitrite: NEGATIVE
Protein, ur: 100 mg/dL — AB
Specific Gravity, Urine: 1.01 (ref 1.005–1.030)
WBC, UA: >50 WBC/hpf (ref 0–5)
pH: 7 (ref 5.0–8.0)

## 2023-06-28 NOTE — Progress Notes (Unsigned)
Location:  [facility] Room Number: 154 Place of Service:  SNF (31)   CODE STATUS: dnr   Allergies  Allergen Reactions   Ace Inhibitors Other (See Comments)    Hyperkalemia    Codeine     Unknown reaction   Sulfa Antibiotics     Unknown reaction    Chief Complaint  Patient presents with   Acute Visit    Urine retention    HPI:  She has had urine leaking from her foley at the end of last week; requiring her foley to be changed. Today she states that there is no urine output; the bladder scan did show 500 cc in her bladder. She does have lower abdominal pain. She does have some burning present. She does complain of "cramping" in her bladder. There are no reports of fevers present.   Past Medical History:  Diagnosis Date   Acute cystitis without hematuria    Adult failure to thrive    Anemia    Anxiety    Atherosclerosis of aorta (HCC)    Central cord syndrome at C4 level of cervical spinal cord, subsequent encounter (HCC)    Chronic kidney disease, stage IV (severe) (HCC)    CKD (chronic kidney disease)    stage 3   Depression    DM type 2 with diabetic peripheral neuropathy (HCC)    Dysphagia    GERD (gastroesophageal reflux disease)    Gout    High cholesterol    HTN (hypertension)    Hyponatremia    MDD (major depressive disorder)    Neck pain    Neuropathy    Quadriplegia, C1-C4 incomplete (HCC)    Radiculopathy    RLS (restless legs syndrome)    Unspecified convulsions (HCC)    Urinary retention     Past Surgical History:  Procedure Laterality Date   ABDOMINAL HYSTERECTOMY     ANTERIOR CERVICAL DECOMP/DISCECTOMY FUSION N/A 02/05/2021   Procedure: Cervical Three-Four  Anterior cervical decompression/discectomy/fusion;  Surgeon: Coletta Memos, MD;  [facility]: [facility];  Service: Neurosurgery;  Laterality: N/A;  RM 20   APPENDECTOMY     BACK SURGERY     BALLOON DILATION N/A 11/19/2022   Procedure: BALLOON DILATION;  Surgeon: Lanelle Bal, DO;  Location: [facility];  Service: Endoscopy;  Laterality: N/A;   BIOPSY  09/22/2021   Procedure: BIOPSY;  Surgeon: Lanelle Bal, DO;  [facility];  Service: Endoscopy;;   BIOPSY  11/19/2022   Procedure: BIOPSY;  Surgeon: Lanelle Bal, DO;  [facility]: [facility];  Service: Endoscopy;;   CERVICAL DISC SURGERY     ESOPHAGOGASTRODUODENOSCOPY (EGD) WITH PROPOFOL N/A 09/22/2021   Procedure: ESOPHAGOGASTRODUODENOSCOPY (EGD) WITH PROPOFOL;  Surgeon: Lanelle Bal, DO;  [facility];  Service: Endoscopy;  Laterality: N/A;  1:00pm   ESOPHAGOGASTRODUODENOSCOPY (EGD) WITH PROPOFOL N/A 11/19/2022   Procedure: ESOPHAGOGASTRODUODENOSCOPY (EGD) WITH PROPOFOL;  Surgeon: Lanelle Bal, DO;  [facility]: [facility];  Service: Endoscopy;  Laterality: N/A;  11:30 am, asa 3   HAND SURGERY     KNEE SURGERY      Social History   Socioeconomic History   Marital status: Widowed    Spouse name: Not on file   Number of children: Not on file   Years of education: Not on file   Highest education level: Not on file  Occupational History   Not on file  Tobacco Use   Smoking status: Never  Smokeless tobacco: Never  Vaping Use   Vaping status: Never Used  Substance and Sexual Activity   Alcohol use: Never   Drug use: Never   Sexual activity: Not Currently  Other Topics Concern   Not on file  Social History Narrative   Lives at [facility]   Social Determinants of Health   Financial Resource Strain: Not on file  Food Insecurity: No Food Insecurity (02/20/2023)   Hunger Vital Sign    Worried About Running Out of Food in the Last Year: Never true    Ran Out of Food in the Last Year: Never true  Transportation Needs: No Transportation Needs (02/20/2023)   PRAPARE - Administrator, Civil Service (Medical): No    Lack of Transportation (Non-Medical): No  Physical Activity: Not on file  Stress: Not on file  Social Connections: Not on  file  Intimate Partner Violence: Not At Risk (02/20/2023)   Humiliation, Afraid, Rape, and Kick questionnaire    Fear of Current or Ex-Partner: No    Emotionally Abused: No    Physically Abused: No    Sexually Abused: No   Family History  Problem Relation Age of Onset   Cancer Mother    Cardiomyopathy Father    Seizures Sister        childhood   Seizures Brother        in his 30s   Colon cancer Neg Hx       VITAL SIGNS BP 138/80   Pulse 79   Temp (!) 97.3 F (36.3 C)   Resp 20   Ht 5\' 2"  (1.575 m)   Wt 187 lb (84.8 kg)   SpO2 96%   BMI 34.20 kg/m   Outpatient Encounter Medications as of 06/28/2023  Medication Sig   acetaminophen (TYLENOL) 325 MG tablet Take 650 mg by mouth 3 (three) times daily.   AMBULATORY NON FORMULARY MEDICATION Medication Name:  Continue monthly catheter changes with 44fr catheter at skilled nursing facility.   amLODipine (NORVASC) 10 MG tablet Take 10 mg by mouth daily.   apixaban (ELIQUIS) 5 MG TABS tablet Take 5 mg by mouth 2 (two) times daily.   augmented betamethasone dipropionate (DIPROLENE-AF) 0.05 % ointment Apply 1 Application topically 2 (two) times daily as needed. Apply to stomach and both breast   calcium-vitamin D (OSCAL WITH D) 500-5 MG-MCG tablet Take 1 tablet by mouth 2 (two) times daily.   camphor-menthol (SARNA) lotion Apply 1 Application topically 2 (two) times daily as needed for itching.   dapagliflozin propanediol (FARXIGA) 10 MG TABS tablet Take 10 mg by mouth daily.   DULoxetine (CYMBALTA) 20 MG capsule Take 20 mg by mouth daily.   feeding supplement (ENSURE ENLIVE / ENSURE PLUS) LIQD Take 237 mLs by mouth daily.   ferrous sulfate 325 (65 FE) MG EC tablet Take 1 tablet (325 mg total) by mouth daily with breakfast.   fexofenadine (ALLEGRA) 60 MG tablet Take 60 mg by mouth daily.   gabapentin (NEURONTIN) 300 MG capsule Take 300 mg by mouth 3 (three) times daily.   insulin aspart (NOVOLOG FLEXPEN) 100 UNIT/ML FlexPen Inject  10 Units into the skin 3 (three) times daily with meals. 8 am, 12 pm, and 6 pm   Insulin Pen Needle 30G X 5 MM MISC 1 Device by Does not apply route daily. 3/16"   ipratropium-albuterol (DUONEB) 0.5-2.5 (3) MG/3ML SOLN Take 3 mLs by nebulization every 6 (six) hours as needed (wheezing).   lamoTRIgine (  LAMICTAL) 25 MG tablet 50 mg BID for 2 weeks then increase to 75mg  BID for 2 weeks then increase to 100mg  BID   LANTUS SOLOSTAR 100 UNIT/ML Solostar Pen Inject 45 Units into the skin at bedtime.   levETIRAcetam (KEPPRA) 500 MG tablet Take 500 mg by mouth 2 (two) times daily. Renal dose   linaclotide (LINZESS) 145 MCG CAPS capsule Take 145 mcg by mouth daily as needed.   LORazepam (ATIVAN) 2 MG/ML injection Inject 0.5 mLs (1 mg total) into the muscle every 15 (fifteen) minutes as needed.   melatonin 5 MG TABS Take 5 mg by mouth at bedtime.   Multiple Vitamins-Minerals (THEREMS M PO) Take 1 tablet by mouth daily. (multivitamin with folic acid) tablet; 400 mcg;   NON FORMULARY Diet - Regular   omeprazole (PRILOSEC) 40 MG capsule Take 40 mg by mouth daily.   Phenylephrine-Cocoa Butter 0.25-85.5 % SUPP Place 1 suppository rectally 2 (two) times daily as needed.   rOPINIRole (REQUIP) 1 MG tablet Take 1 mg by mouth at bedtime.   rosuvastatin (CRESTOR) 20 MG tablet Take 20 mg by mouth every evening.   sodium bicarbonate 650 MG tablet Take 1 tablet (650 mg total) by mouth 2 (two) times daily.   tiZANidine (ZANAFLEX) 2 MG tablet Take 2 mg by mouth every 6 (six) hours as needed for muscle spasms (for back and sciatic pain).   zinc oxide 20 % ointment Apply 1 Application topically See admin instructions. Apply every shift to bilateral buttocks, sacrum, and coccyx   No facility-administered encounter medications on file as of 06/28/2023.     SIGNIFICANT DIAGNOSTIC EXAMS  PREVIOUS   04-15-22; dexa: t score -3.391  NO NEW EXAMS    LABS REVIEWED PREVIOUS   07-09-22: wbc 9.3; hgb 8.5; hct 26.9; mcv 96.8  plt 196; glucose 200; bun 65; creat 2.15; k+ 5.0; na++ 128; ca 7.8; gfr 22 07-10-22: glucose 130; bun 59; creat 1.75; k+ 4.2; na++ 131; ca 7.6; gfr 29 07-30-22: urine micro-albumin 59.7; ACR 127   08-20-22: hgb A1c 8.8 10-01-22: wbc 5.6; hgb 9.6; hct 29.9; mcv 94.9 plt 245; glucose 404; bun 43; creat 1.69; k+ 5.2; na++ 135; ca 8.2 gfr 30 protein 7.2 albumin 3.5 10-19-22: glucose 176; bun 45; creat 1.63; k+ 5.8; na++ 134; ca 8.9; gfr 31 10-31-22: wbc 4.6; hgb 8.4; hct 26.5; mcv 94.0 plt 183; glucose 93; bun 46; creat 1.64; k+ 5.2; na++ 134; ca 8.6; gfr 31; protein 6.5 albumin 3.2  11-05-22: glucose 117; bun 35; creat 1.25; k+ 4.3;na++ 137; ca 7.5; gfr 43; hgb A1c 8.5; vitamin D 26.12; chol 149; ldl 73 trig 166; hdl 43  01-25-23: wbc 4.5; hgb 8.8; hct 27.0; mcv 94.7 plt 178; glucose 163; bun 45; creat 1.58; k+ 5.8; na++ 134; ca 8.7; gfr 32; protein 6.6 albumin 3.4 iron 54; tibc 305; vitamin B12: 496; keppra 80.7 (normal 10-40) 01-26-23: glucose 116; bun 41; creat 1.54; k+ 5.5; na++ 133; ca 8.5 gfr 33   02-08-23: keppra 41.6 (10-40) 02-18-23: hgb A1c 7.5; tsh 5.113 02-19-23: wbc 6.0; hgb 7.9; hct 24.7; mcv 95.7 plt 202; glucose 225; bun 45; creat 1.76; k+ 6.4; na++ 130; ca 8.7; gfr 28; (repeat k+ 5.4) 02-20-23: wbc 6.2; hgb 7.5; hct 24.1; mcv 95.6 plt 186;glucose 195; bun 45; creat 1.76; k+ 5.6; na++ 134; ca 8.5 gfr 28; protein 6.2; albumin 3.1 free t4: 0.80; mag 1.8 02-21-23: vitamin B12 781; folate 26.1; iron 26; tibc 266; ferritin 139 03-23-23: wbc 6.3; hgb  10.0; hct 31.0; mcv 92.8 plt 186; glucose 239; bun 41; creat 1.35; k+ 4.6; na++ 135; ca 8.7; gfr 39; protein 7.6 albumin 3.6  04-05-23: glucose 215; bun 49; creat 1.88; k+ 4.3; na++ 134; ca 8.4; gfr 26 04-29-23: hgb A1c 10.2    NO NEW LABS.     Review of Systems  Constitutional:  Negative for malaise/fatigue.  Respiratory:  Negative for cough and shortness of breath.   Cardiovascular:  Negative for chest pain, palpitations and leg swelling.   Gastrointestinal:  Negative for abdominal pain, constipation and heartburn.  Genitourinary:        Has burning Has cramping present No urine in foley bag  Musculoskeletal:  Negative for back pain, joint pain and myalgias.  Skin: Negative.   Neurological:  Negative for dizziness.  Psychiatric/Behavioral:  The patient is not nervous/anxious.      Physical Exam Constitutional:      General: She is not in acute distress.    Appearance: She is well-developed. She is obese. She is not diaphoretic.  Neck:     Thyroid: No thyromegaly.  Cardiovascular:     Rate and Rhythm: Normal rate and regular rhythm.     Pulses: Normal pulses.     Heart sounds: Normal heart sounds.  Pulmonary:     Effort: Pulmonary effort is normal. No respiratory distress.     Breath sounds: Normal breath sounds.  Abdominal:     General: Bowel sounds are normal. There is no distension.     Palpations: Abdomen is soft.     Tenderness: There is no abdominal tenderness.  Genitourinary:    Comments: Foley changed  Musculoskeletal:     Cervical back: Neck supple.     Right lower leg: No edema.     Left lower leg: No edema.     Comments: Limited range of motion in upper extremities Does not move lower extremities   Lymphadenopathy:     Cervical: No cervical adenopathy.  Skin:    General: Skin is warm and dry.  Neurological:     Mental Status: She is alert. Mental status is at baseline.     Comments: Limited range of motion in upper extremities Does not move lower extremities   Psychiatric:        Mood and Affect: Mood normal.    ASSESSMENT/ PLAN:  TODAY  Neurogenic bladder with urine retention: her foley has been changed; will send off urine for culture and will monitor her status.    Synthia Innocent NP [facility]   call [phone]

## 2023-06-29 ENCOUNTER — Telehealth: Payer: Self-pay | Admitting: Neurology

## 2023-06-29 NOTE — Telephone Encounter (Signed)
Received a call from [facility] from [place] that the patient likely has a UTI going on which they are working on treating. They have had to change out the foley to a 16 F catheter due to the foley leaking. The MD advised that she wanted the staff to make [place] aware. I advised I would note this on our end but we don't really have anything to do with the patient foley care.

## 2023-06-30 ENCOUNTER — Non-Acute Institutional Stay (SKILLED_NURSING_FACILITY): Payer: Medicare HMO | Admitting: Adult Health

## 2023-06-30 ENCOUNTER — Encounter: Payer: Self-pay | Admitting: Adult Health

## 2023-06-30 DIAGNOSIS — A498 Other bacterial infections of unspecified site: Secondary | ICD-10-CM | POA: Diagnosis not present

## 2023-06-30 LAB — URINE CULTURE: Culture: >=100000 — AB

## 2023-06-30 NOTE — Progress Notes (Unsigned)
Location:  [facility] Room Number: 154 Place of Service:  SNF (31)   CODE STATUS: dnr   Allergies  Allergen Reactions   Ace Inhibitors Other (See Comments)    Hyperkalemia    Codeine     Unknown reaction   Sulfa Antibiotics     Unknown reaction    Chief Complaint  Patient presents with   Acute Visit    uti    HPI:  She had a urine culture done for lack of urine output (has foley) lower abdominal pain; and cramping present. There were no reports of fevers present. Her urine culture has grown out proteus mirabilis.    Past Medical History:  Diagnosis Date   Acute cystitis without hematuria    Adult failure to thrive    Anemia    Anxiety    Atherosclerosis of aorta (HCC)    Central cord syndrome at C4 level of cervical spinal cord, subsequent encounter (HCC)    Chronic kidney disease, stage IV (severe) (HCC)    CKD (chronic kidney disease)    stage 3   Depression    DM type 2 with diabetic peripheral neuropathy (HCC)    Dysphagia    GERD (gastroesophageal reflux disease)    Gout    High cholesterol    HTN (hypertension)    Hyponatremia    MDD (major depressive disorder)    Neck pain    Neuropathy    Quadriplegia, C1-C4 incomplete (HCC)    Radiculopathy    RLS (restless legs syndrome)    Unspecified convulsions (HCC)    Urinary retention     Past Surgical History:  Procedure Laterality Date   ABDOMINAL HYSTERECTOMY     ANTERIOR CERVICAL DECOMP/DISCECTOMY FUSION N/A 02/05/2021   Procedure: Cervical Three-Four  Anterior cervical decompression/discectomy/fusion;  Surgeon: Coletta Memos, MD;  Location: [facility] OR;  Service: Neurosurgery;  Laterality: N/A;  RM 20   APPENDECTOMY     BACK SURGERY     BALLOON DILATION N/A 11/19/2022   Procedure: BALLOON DILATION;  Surgeon: Lanelle Bal, DO;  [facility]: [facility];  Service: Endoscopy;  Laterality: N/A;   BIOPSY  09/22/2021   Procedure: BIOPSY;  Surgeon: Lanelle Bal, DO;  [facility];  Service: Endoscopy;;   BIOPSY  11/19/2022   Procedure: BIOPSY;  Surgeon: Lanelle Bal, DO;  [facility];  Service: Endoscopy;;   CERVICAL DISC SURGERY     ESOPHAGOGASTRODUODENOSCOPY (EGD) WITH PROPOFOL N/A 09/22/2021   Procedure: ESOPHAGOGASTRODUODENOSCOPY (EGD) WITH PROPOFOL;  Surgeon: Lanelle Bal, DO;  [facility];  Service: Endoscopy;  Laterality: N/A;  1:00pm   ESOPHAGOGASTRODUODENOSCOPY (EGD) WITH PROPOFOL N/A 11/19/2022   Procedure: ESOPHAGOGASTRODUODENOSCOPY (EGD) WITH PROPOFOL;  Surgeon: Lanelle Bal, DO;  [facility];  Service: Endoscopy;  Laterality: N/A;  11:30 am, asa 3   HAND SURGERY     KNEE SURGERY      Social History   Socioeconomic History   Marital status: Widowed    Spouse name: Not on file   Number of children: Not on file   Years of education: Not on file   Highest education level: Not on file  Occupational History   Not on file  Tobacco Use   Smoking status: Never   Smokeless tobacco: Never  Vaping Use   Vaping status: Never Used  Substance and Sexual Activity   Alcohol use: Never   Drug use: Never   Sexual  activity: Not Currently  Other Topics Concern   Not on file  Social History Narrative   Lives at [facility]   Social Determinants of Health   Financial Resource Strain: Not on file  Food Insecurity: No Food Insecurity (02/20/2023)   Hunger Vital Sign    Worried About Running Out of Food in the Last Year: Never true    Ran Out of Food in the Last Year: Never true  Transportation Needs: No Transportation Needs (02/20/2023)   PRAPARE - Administrator, Civil Service (Medical): No    Lack of Transportation (Non-Medical): No  Physical Activity: Not on file  Stress: Not on file  Social Connections: Not on file  Intimate Partner Violence: Not At Risk (02/20/2023)   Humiliation, Afraid, Rape, and Kick questionnaire    Fear of Current or Ex-Partner: No    Emotionally  Abused: No    Physically Abused: No    Sexually Abused: No   Family History  Problem Relation Age of Onset   Cancer Mother    Cardiomyopathy Father    Seizures Sister        childhood   Seizures Brother        in his 30s   Colon cancer Neg Hx       VITAL SIGNS BP 133/76   Pulse 76   Temp 98.1 F (36.7 C)   Resp 18   Ht 5\' 2"  (1.575 m)   Wt 187 lb (84.8 kg)   SpO2 96%   BMI 34.20 kg/m   Outpatient Encounter Medications as of 06/30/2023  Medication Sig   acetaminophen (TYLENOL) 325 MG tablet Take 650 mg by mouth 3 (three) times daily.   AMBULATORY NON FORMULARY MEDICATION Medication Name:  Continue monthly catheter changes with 11fr catheter at skilled nursing facility.   amLODipine (NORVASC) 10 MG tablet Take 10 mg by mouth daily.   apixaban (ELIQUIS) 5 MG TABS tablet Take 5 mg by mouth 2 (two) times daily.   augmented betamethasone dipropionate (DIPROLENE-AF) 0.05 % ointment Apply 1 Application topically 2 (two) times daily as needed. Apply to stomach and both breast   calcium-vitamin D (OSCAL WITH D) 500-5 MG-MCG tablet Take 1 tablet by mouth 2 (two) times daily.   camphor-menthol (SARNA) lotion Apply 1 Application topically 2 (two) times daily as needed for itching.   dapagliflozin propanediol (FARXIGA) 10 MG TABS tablet Take 10 mg by mouth daily.   DULoxetine (CYMBALTA) 20 MG capsule Take 20 mg by mouth daily.   feeding supplement (ENSURE ENLIVE / ENSURE PLUS) LIQD Take 237 mLs by mouth daily.   ferrous sulfate 325 (65 FE) MG EC tablet Take 1 tablet (325 mg total) by mouth daily with breakfast.   fexofenadine (ALLEGRA) 60 MG tablet Take 60 mg by mouth daily.   gabapentin (NEURONTIN) 300 MG capsule Take 300 mg by mouth 3 (three) times daily.   insulin aspart (NOVOLOG FLEXPEN) 100 UNIT/ML FlexPen Inject 10 Units into the skin 3 (three) times daily with meals. 8 am, 12 pm, and 6 pm   Insulin Pen Needle 30G X 5 MM MISC 1 Device by Does not apply route daily. 3/16"    ipratropium-albuterol (DUONEB) 0.5-2.5 (3) MG/3ML SOLN Take 3 mLs by nebulization every 6 (six) hours as needed (wheezing).   lamoTRIgine (LAMICTAL) 25 MG tablet 50 mg BID for 2 weeks then increase to 75mg  BID for 2 weeks then increase to 100mg  BID   LANTUS SOLOSTAR 100 UNIT/ML Solostar Pen  Inject 45 Units into the skin at bedtime.   levETIRAcetam (KEPPRA) 500 MG tablet Take 500 mg by mouth 2 (two) times daily. Renal dose   linaclotide (LINZESS) 145 MCG CAPS capsule Take 145 mcg by mouth daily as needed.   LORazepam (ATIVAN) 2 MG/ML injection Inject 0.5 mLs (1 mg total) into the muscle every 15 (fifteen) minutes as needed.   melatonin 5 MG TABS Take 5 mg by mouth at bedtime.   Multiple Vitamins-Minerals (THEREMS M PO) Take 1 tablet by mouth daily. (multivitamin with folic acid) tablet; 400 mcg;   NON FORMULARY Diet - Regular   omeprazole (PRILOSEC) 40 MG capsule Take 40 mg by mouth daily.   Phenylephrine-Cocoa Butter 0.25-85.5 % SUPP Place 1 suppository rectally 2 (two) times daily as needed.   rOPINIRole (REQUIP) 1 MG tablet Take 1 mg by mouth at bedtime.   rosuvastatin (CRESTOR) 20 MG tablet Take 20 mg by mouth every evening.   sodium bicarbonate 650 MG tablet Take 1 tablet (650 mg total) by mouth 2 (two) times daily.   tiZANidine (ZANAFLEX) 2 MG tablet Take 2 mg by mouth every 6 (six) hours as needed for muscle spasms (for back and sciatic pain).   zinc oxide 20 % ointment Apply 1 Application topically See admin instructions. Apply every shift to bilateral buttocks, sacrum, and coccyx   No facility-administered encounter medications on file as of 06/30/2023.     SIGNIFICANT DIAGNOSTIC EXAMS  PREVIOUS   04-15-22; dexa: t score -3.391  NO NEW EXAMS    LABS REVIEWED PREVIOUS   07-09-22: wbc 9.3; hgb 8.5; hct 26.9; mcv 96.8 plt 196; glucose 200; bun 65; creat 2.15; k+ 5.0; na++ 128; ca 7.8; gfr 22 07-10-22: glucose 130; bun 59; creat 1.75; k+ 4.2; na++ 131; ca 7.6; gfr 29 07-30-22:  urine micro-albumin 59.7; ACR 127   08-20-22: hgb A1c 8.8 10-01-22: wbc 5.6; hgb 9.6; hct 29.9; mcv 94.9 plt 245; glucose 404; bun 43; creat 1.69; k+ 5.2; na++ 135; ca 8.2 gfr 30 protein 7.2 albumin 3.5 10-19-22: glucose 176; bun 45; creat 1.63; k+ 5.8; na++ 134; ca 8.9; gfr 31 10-31-22: wbc 4.6; hgb 8.4; hct 26.5; mcv 94.0 plt 183; glucose 93; bun 46; creat 1.64; k+ 5.2; na++ 134; ca 8.6; gfr 31; protein 6.5 albumin 3.2  11-05-22: glucose 117; bun 35; creat 1.25; k+ 4.3;na++ 137; ca 7.5; gfr 43; hgb A1c 8.5; vitamin D 26.12; chol 149; ldl 73 trig 166; hdl 43  01-25-23: wbc 4.5; hgb 8.8; hct 27.0; mcv 94.7 plt 178; glucose 163; bun 45; creat 1.58; k+ 5.8; na++ 134; ca 8.7; gfr 32; protein 6.6 albumin 3.4 iron 54; tibc 305; vitamin B12: 496; keppra 80.7 (normal 10-40) 01-26-23: glucose 116; bun 41; creat 1.54; k+ 5.5; na++ 133; ca 8.5 gfr 33   02-08-23: keppra 41.6 (10-40) 02-18-23: hgb A1c 7.5; tsh 5.113 02-19-23: wbc 6.0; hgb 7.9; hct 24.7; mcv 95.7 plt 202; glucose 225; bun 45; creat 1.76; k+ 6.4; na++ 130; ca 8.7; gfr 28; (repeat k+ 5.4) 02-20-23: wbc 6.2; hgb 7.5; hct 24.1; mcv 95.6 plt 186;glucose 195; bun 45; creat 1.76; k+ 5.6; na++ 134; ca 8.5 gfr 28; protein 6.2; albumin 3.1 free t4: 0.80; mag 1.8 02-21-23: vitamin B12 781; folate 26.1; iron 26; tibc 266; ferritin 139 03-23-23: wbc 6.3; hgb 10.0; hct 31.0; mcv 92.8 plt 186; glucose 239; bun 41; creat 1.35; k+ 4.6; na++ 135; ca 8.7; gfr 39; protein 7.6 albumin 3.6  04-05-23: glucose 215; bun 49;  creat 1.88; k+ 4.3; na++ 134; ca 8.4; gfr 26 04-29-23: hgb A1c 10.2    TODAY  06-24-23: chol 140; ldl 64; trig 190; hdl 38 06-28-23: urine culture: proteus mirabilis: cipro   Review of Systems  Constitutional:  Negative for malaise/fatigue.  Respiratory:  Negative for cough and shortness of breath.   Cardiovascular:  Negative for chest pain, palpitations and leg swelling.  Gastrointestinal:  Negative for abdominal pain, constipation and heartburn.   Genitourinary:  Dysuria: has lower abdominal pain; bladder cramping.  Musculoskeletal:  Negative for back pain, joint pain and myalgias.  Skin: Negative.   Neurological:  Negative for dizziness.  Psychiatric/Behavioral:  The patient is not nervous/anxious.      Physical Exam Constitutional:      General: She is not in acute distress.    Appearance: She is well-developed. She is not diaphoretic.  Neck:     Thyroid: No thyromegaly.  Cardiovascular:     Rate and Rhythm: Normal rate and regular rhythm.     Pulses: Normal pulses.     Heart sounds: Normal heart sounds.  Pulmonary:     Effort: Pulmonary effort is normal. No respiratory distress.     Breath sounds: Normal breath sounds.  Abdominal:     General: Bowel sounds are normal. There is no distension.     Palpations: Abdomen is soft.     Tenderness: There is no abdominal tenderness.  Genitourinary:    Comments: foley Musculoskeletal:     Cervical back: Neck supple.     Right lower leg: No edema.     Left lower leg: No edema.     Comments: Limited range of motion in upper extremities Does not move lower extremities    Lymphadenopathy:     Cervical: No cervical adenopathy.  Skin:    General: Skin is warm and dry.  Neurological:     Mental Status: She is alert. Mental status is at baseline.     Comments: 03-22-23: SLUMS 14/30    Psychiatric:        Mood and Affect: Mood normal.      ASSESSMENT/ PLAN:  TODAY  Bacterial infection due to proteus mirabilis: will begin cipro 500 mg twice daily through 07-06-23    Synthia Innocent NP [facility]  call [phone]

## 2023-07-01 DIAGNOSIS — A498 Other bacterial infections of unspecified site: Secondary | ICD-10-CM | POA: Insufficient documentation

## 2023-07-05 NOTE — Progress Notes (Deleted)
GI Office Note    Referring Provider: Sharee Holster, NP Primary Care Physician:  Sharee Holster, NP Primary Gastroenterologist: ***  Date:  07/05/2023  ID:  [name], DOB June 23, 1936, MRN [id number]   Chief Complaint   No chief complaint on file.    History of Present Illness  [name] is a 87 y.o. female with a history of *** presenting today with complaint of     Current Outpatient Medications  Medication Sig Dispense Refill   acetaminophen (TYLENOL) 325 MG tablet Take 650 mg by mouth 3 (three) times daily.     AMBULATORY NON FORMULARY MEDICATION Medication Name:  Continue monthly catheter changes with 86fr catheter at skilled nursing facility. 1 application MONTHLY   amLODipine (NORVASC) 10 MG tablet Take 10 mg by mouth daily.     apixaban (ELIQUIS) 5 MG TABS tablet Take 5 mg by mouth 2 (two) times daily.     augmented betamethasone dipropionate (DIPROLENE-AF) 0.05 % ointment Apply 1 Application topically 2 (two) times daily as needed. Apply to stomach and both breast     calcium-vitamin D (OSCAL WITH D) 500-5 MG-MCG tablet Take 1 tablet by mouth 2 (two) times daily.     camphor-menthol (SARNA) lotion Apply 1 Application topically 2 (two) times daily as needed for itching.     dapagliflozin propanediol (FARXIGA) 10 MG TABS tablet Take 10 mg by mouth daily.     DULoxetine (CYMBALTA) 20 MG capsule Take 20 mg by mouth daily.     feeding supplement (ENSURE ENLIVE / ENSURE PLUS) LIQD Take 237 mLs by mouth daily.     ferrous sulfate 325 (65 FE) MG EC tablet Take 1 tablet (325 mg total) by mouth daily with breakfast. 60 tablet 3   fexofenadine (ALLEGRA) 60 MG tablet Take 60 mg by mouth daily.     gabapentin (NEURONTIN) 300 MG capsule Take 300 mg by mouth 3 (three) times daily.     insulin aspart (NOVOLOG FLEXPEN) 100 UNIT/ML FlexPen Inject 10 Units into the skin 3 (three) times daily with meals. 8 am, 12 pm, and 6 pm     Insulin Pen Needle 30G X 5 MM MISC 1 Device  by Does not apply route daily. 3/16"  0   ipratropium-albuterol (DUONEB) 0.5-2.5 (3) MG/3ML SOLN Take 3 mLs by nebulization every 6 (six) hours as needed (wheezing).     lamoTRIgine (LAMICTAL) 25 MG tablet 50 mg BID for 2 weeks then increase to 75mg  BID for 2 weeks then increase to 100mg  BID 380 tablet 0   LANTUS SOLOSTAR 100 UNIT/ML Solostar Pen Inject 45 Units into the skin at bedtime.     levETIRAcetam (KEPPRA) 500 MG tablet Take 500 mg by mouth 2 (two) times daily. Renal dose     linaclotide (LINZESS) 145 MCG CAPS capsule Take 145 mcg by mouth daily as needed.     LORazepam (ATIVAN) 2 MG/ML injection Inject 0.5 mLs (1 mg total) into the muscle every 15 (fifteen) minutes as needed. 4 mL 0   melatonin 5 MG TABS Take 5 mg by mouth at bedtime.     Multiple Vitamins-Minerals (THEREMS M PO) Take 1 tablet by mouth daily. (multivitamin with folic acid) tablet; 400 mcg;     NON FORMULARY Diet - Regular     omeprazole (PRILOSEC) 40 MG capsule Take 40 mg by mouth daily.     Phenylephrine-Cocoa Butter 0.25-85.5 % SUPP Place 1 suppository rectally 2 (two) times daily as needed.  rOPINIRole (REQUIP) 1 MG tablet Take 1 mg by mouth at bedtime.     rosuvastatin (CRESTOR) 20 MG tablet Take 20 mg by mouth every evening.     sodium bicarbonate 650 MG tablet Take 1 tablet (650 mg total) by mouth 2 (two) times daily.     tiZANidine (ZANAFLEX) 2 MG tablet Take 2 mg by mouth every 6 (six) hours as needed for muscle spasms (for back and sciatic pain).     zinc oxide 20 % ointment Apply 1 Application topically See admin instructions. Apply every shift to bilateral buttocks, sacrum, and coccyx     No current facility-administered medications for this visit.    Past Medical History:  Diagnosis Date   Acute cystitis without hematuria    Adult failure to thrive    Anemia    Anxiety    Atherosclerosis of aorta (HCC)    Central cord syndrome at C4 level of cervical spinal cord, subsequent encounter (HCC)     Chronic kidney disease, stage IV (severe) (HCC)    CKD (chronic kidney disease)    stage 3   Depression    DM type 2 with diabetic peripheral neuropathy (HCC)    Dysphagia    GERD (gastroesophageal reflux disease)    Gout    High cholesterol    HTN (hypertension)    Hyponatremia    MDD (major depressive disorder)    Neck pain    Neuropathy    Quadriplegia, C1-C4 incomplete (HCC)    Radiculopathy    RLS (restless legs syndrome)    Unspecified convulsions (HCC)    Urinary retention     Past Surgical History:  Procedure Laterality Date   ABDOMINAL HYSTERECTOMY     ANTERIOR CERVICAL DECOMP/DISCECTOMY FUSION N/A 02/05/2021   Procedure: Cervical Three-Four  Anterior cervical decompression/discectomy/fusion;  Surgeon: Coletta Memos, MD;  Location: [facility];  Service: Neurosurgery;  Laterality: N/A;  RM 20   APPENDECTOMY     BACK SURGERY     BALLOON DILATION N/A 11/19/2022   Procedure: BALLOON DILATION;  Surgeon: Lanelle Bal, DO;  [facility];  Service: Endoscopy;  Laterality: N/A;   BIOPSY  09/22/2021   Procedure: BIOPSY;  Surgeon: Lanelle Bal, DO;  [facility];  Service: Endoscopy;;   BIOPSY  11/19/2022   Procedure: BIOPSY;  Surgeon: Lanelle Bal, DO;  [facility];  Service: Endoscopy;;   CERVICAL DISC SURGERY     ESOPHAGOGASTRODUODENOSCOPY (EGD) WITH PROPOFOL N/A 09/22/2021   Procedure: ESOPHAGOGASTRODUODENOSCOPY (EGD) WITH PROPOFOL;  Surgeon: Lanelle Bal, DO;  [facility];  Service: Endoscopy;  Laterality: N/A;  1:00pm   ESOPHAGOGASTRODUODENOSCOPY (EGD) WITH PROPOFOL N/A 11/19/2022   Procedure: ESOPHAGOGASTRODUODENOSCOPY (EGD) WITH PROPOFOL;  Surgeon: Lanelle Bal, DO;  [facility];  Service: Endoscopy;  Laterality: N/A;  11:30 am, asa 3   HAND SURGERY     KNEE SURGERY      Family History  Problem Relation Age of Onset   Cancer Mother    Cardiomyopathy Father    Seizures Sister        childhood    Seizures Brother        in his 55s   Colon cancer Neg Hx     Allergies as of 07/06/2023 - Review Complete 06/11/2023  Allergen Reaction Noted   Ace inhibitors Other (See Comments) 08/29/2021   Codeine  03/09/2018   Sulfa antibiotics  03/25/2021    Social History  Socioeconomic History   Marital status: Widowed    Spouse name: Not on file   Number of children: Not on file   Years of education: Not on file   Highest education level: Not on file  Occupational History   Not on file  Tobacco Use   Smoking status: Never   Smokeless tobacco: Never  Vaping Use   Vaping status: Never Used  Substance and Sexual Activity   Alcohol use: Never   Drug use: Never   Sexual activity: Not Currently  Other Topics Concern   Not on file  Social History Narrative   Lives at [facility]   Social Determinants of Health   Financial Resource Strain: Not on file  Food Insecurity: No Food Insecurity (02/20/2023)   Hunger Vital Sign    Worried About Running Out of Food in the Last Year: Never true    Ran Out of Food in the Last Year: Never true  Transportation Needs: No Transportation Needs (02/20/2023)   PRAPARE - Administrator, Civil Service (Medical): No    Lack of Transportation (Non-Medical): No  Physical Activity: Not on file  Stress: Not on file  Social Connections: Not on file     Review of Systems   Gen: Denies fever, chills, anorexia. Denies fatigue, weakness, weight loss.  CV: Denies chest pain, palpitations, syncope, peripheral edema, and claudication. Resp: Denies dyspnea at rest, cough, wheezing, coughing up blood, and pleurisy. GI: See HPI Derm: Denies rash, itching, dry skin Psych: Denies depression, anxiety, memory loss, confusion. No homicidal or suicidal ideation.  Heme: Denies bruising, bleeding, and enlarged lymph nodes.   Physical Exam   There were no vitals taken for this visit.  General:   Alert and oriented. No distress noted.  Pleasant and cooperative.  Head:  Normocephalic and atraumatic. Eyes:  Conjuctiva clear without scleral icterus. Mouth:  Oral mucosa pink and moist. Good dentition. No lesions. Lungs:  Clear to auscultation bilaterally. No wheezes, rales, or rhonchi. No distress.  Heart:  S1, S2 present without murmurs appreciated.  Abdomen:  +BS, soft, non-tender and non-distended. No rebound or guarding. No HSM or masses noted. Rectal: *** Msk:  Symmetrical without gross deformities. Normal posture. Extremities:  Without edema. Neurologic:  Alert and  oriented x4 Psych:  Alert and cooperative. Normal mood and affect.   Assessment  [name] is a 87 y.o. female with a history of *** presenting today with    PLAN   ***     Brooke Bonito, MSN, FNP-BC, AGACNP-BC [facility]

## 2023-07-06 ENCOUNTER — Ambulatory Visit: Payer: Medicare HMO | Admitting: Gastroenterology

## 2023-07-09 DIAGNOSIS — L602 Onychogryphosis: Secondary | ICD-10-CM | POA: Diagnosis not present

## 2023-07-09 DIAGNOSIS — I739 Peripheral vascular disease, unspecified: Secondary | ICD-10-CM | POA: Diagnosis not present

## 2023-07-09 DIAGNOSIS — L603 Nail dystrophy: Secondary | ICD-10-CM | POA: Diagnosis not present

## 2023-07-19 ENCOUNTER — Non-Acute Institutional Stay (INDEPENDENT_AMBULATORY_CARE_PROVIDER_SITE_OTHER): Payer: Medicare HMO | Admitting: Student

## 2023-07-19 DIAGNOSIS — I10 Essential (primary) hypertension: Secondary | ICD-10-CM | POA: Diagnosis not present

## 2023-07-19 DIAGNOSIS — E1122 Type 2 diabetes mellitus with diabetic chronic kidney disease: Secondary | ICD-10-CM

## 2023-07-19 DIAGNOSIS — E039 Hypothyroidism, unspecified: Secondary | ICD-10-CM

## 2023-07-19 DIAGNOSIS — Z794 Long term (current) use of insulin: Secondary | ICD-10-CM

## 2023-07-19 DIAGNOSIS — D638 Anemia in other chronic diseases classified elsewhere: Secondary | ICD-10-CM

## 2023-07-19 DIAGNOSIS — R310 Gross hematuria: Secondary | ICD-10-CM

## 2023-07-19 DIAGNOSIS — N184 Chronic kidney disease, stage 4 (severe): Secondary | ICD-10-CM

## 2023-07-19 DIAGNOSIS — E1169 Type 2 diabetes mellitus with other specified complication: Secondary | ICD-10-CM

## 2023-07-19 DIAGNOSIS — R569 Unspecified convulsions: Secondary | ICD-10-CM | POA: Diagnosis not present

## 2023-07-19 NOTE — Progress Notes (Unsigned)
Location:  [facility]   Place of Service:   [address]  CODE STATUS: DNR  Allergies  Allergen Reactions   Ace Inhibitors Other (See Comments)    Hyperkalemia    Codeine     Unknown reaction   Sulfa Antibiotics     Unknown reaction    No chief complaint on file.   HPI: Feeling generally well today. Says she still has some chronic abdominal pain on the left side.  Reports constipation, would like to have a bowel movement. Her right arm has pain sometimes, gets better when she flexes her arm upward. She says she is feeling cold and using more blankets. Otherwise, eating and drinking well without difficulty. Denies any pain/discomfort with her urinary catheter. Her spirits were good- had an early birthday party the other day.  PEX: General: Acute on chronically ill appearing, elderly, pleasant and resting in bed in no distress CV: RRR Respiratory: Speaks in full sentences. Normal effort on room air. Abdomen: Protuberant, soft, tender to palpation in LLQ Extremities: Ecchymosis on right forearm, mildly tender to palpation. No ecchymoses elsewhere.   Past Medical History:  Diagnosis Date   Acute cystitis without hematuria    Adult failure to thrive    Anemia    Anxiety    Atherosclerosis of aorta (HCC)    Central cord syndrome at C4 level of cervical spinal cord, subsequent encounter (HCC)    Chronic kidney disease, stage IV (severe) (HCC)    CKD (chronic kidney disease)    stage 3   Depression    DM type 2 with diabetic peripheral neuropathy (HCC)    Dysphagia    GERD (gastroesophageal reflux disease)    Gout    High cholesterol    HTN (hypertension)    Hyponatremia    MDD (major depressive disorder)    Neck pain    Neuropathy    Quadriplegia, C1-C4 incomplete (HCC)    Radiculopathy    RLS (restless legs syndrome)    Unspecified convulsions (HCC)    Urinary retention     Past Surgical History:  Procedure Laterality Date   ABDOMINAL  HYSTERECTOMY     ANTERIOR CERVICAL DECOMP/DISCECTOMY FUSION N/A 02/05/2021   Procedure: Cervical Three-Four  Anterior cervical decompression/discectomy/fusion;  Surgeon: Coletta Memos, MD;  Location: [facility];  Service: Neurosurgery;  Laterality: N/A;  RM 20   APPENDECTOMY     BACK SURGERY     BALLOON DILATION N/A 11/19/2022   Procedure: BALLOON DILATION;  Surgeon: Lanelle Bal, DO;  [facility];  Service: Endoscopy;  Laterality: N/A;   BIOPSY  09/22/2021   Procedure: BIOPSY;  Surgeon: Lanelle Bal, DO;  [facility];  Service: Endoscopy;;   BIOPSY  11/19/2022   Procedure: BIOPSY;  Surgeon: Lanelle Bal, DO;  [facility];  Service: Endoscopy;;   CERVICAL DISC SURGERY     ESOPHAGOGASTRODUODENOSCOPY (EGD) WITH PROPOFOL N/A 09/22/2021   Procedure: ESOPHAGOGASTRODUODENOSCOPY (EGD) WITH PROPOFOL;  Surgeon: Lanelle Bal, DO;  [facility];  Service: Endoscopy;  Laterality: N/A;  1:00pm   ESOPHAGOGASTRODUODENOSCOPY (EGD) WITH PROPOFOL N/A 11/19/2022   Procedure: ESOPHAGOGASTRODUODENOSCOPY (EGD) WITH PROPOFOL;  Surgeon: Lanelle Bal, DO;  [facility];  Service: Endoscopy;  Laterality: N/A;  11:30 am, asa 3   HAND SURGERY     KNEE SURGERY      Social History   Socioeconomic History   Marital status: Widowed    Spouse name:  Not on file   Number of children: Not on file   Years of education: Not on file   Highest education level: Not on file  Occupational History   Not on file  Tobacco Use   Smoking status: Never   Smokeless tobacco: Never  Vaping Use   Vaping status: Never Used  Substance and Sexual Activity   Alcohol use: Never   Drug use: Never   Sexual activity: Not Currently  Other Topics Concern   Not on file  Social History Narrative   Lives at [facility]   Social Determinants of Health   Financial Resource Strain: Not on file  Food Insecurity: No Food Insecurity (02/20/2023)   Hunger Vital  Sign    Worried About Running Out of Food in the Last Year: Never true    Ran Out of Food in the Last Year: Never true  Transportation Needs: No Transportation Needs (02/20/2023)   PRAPARE - Administrator, Civil Service (Medical): No    Lack of Transportation (Non-Medical): No  Physical Activity: Not on file  Stress: Not on file  Social Connections: Not on file  Intimate Partner Violence: Not At Risk (02/20/2023)   Humiliation, Afraid, Rape, and Kick questionnaire    Fear of Current or Ex-Partner: No    Emotionally Abused: No    Physically Abused: No    Sexually Abused: No   Family History  Problem Relation Age of Onset   Cancer Mother    Cardiomyopathy Father    Seizures Sister        childhood   Seizures Brother        in his 30s   Colon cancer Neg Hx       VITAL SIGNS There were no vitals taken for this visit.  Outpatient Encounter Medications as of 07/19/2023  Medication Sig   acetaminophen (TYLENOL) 325 MG tablet Take 650 mg by mouth 3 (three) times daily.   AMBULATORY NON FORMULARY MEDICATION Medication Name:  Continue monthly catheter changes with 86fr catheter at skilled nursing facility.   amLODipine (NORVASC) 10 MG tablet Take 10 mg by mouth daily.   apixaban (ELIQUIS) 5 MG TABS tablet Take 5 mg by mouth 2 (two) times daily.   augmented betamethasone dipropionate (DIPROLENE-AF) 0.05 % ointment Apply 1 Application topically 2 (two) times daily as needed. Apply to stomach and both breast   calcium-vitamin D (OSCAL WITH D) 500-5 MG-MCG tablet Take 1 tablet by mouth 2 (two) times daily.   camphor-menthol (SARNA) lotion Apply 1 Application topically 2 (two) times daily as needed for itching.   dapagliflozin propanediol (FARXIGA) 10 MG TABS tablet Take 10 mg by mouth daily.   DULoxetine (CYMBALTA) 20 MG capsule Take 20 mg by mouth daily.   feeding supplement (ENSURE ENLIVE / ENSURE PLUS) LIQD Take 237 mLs by mouth daily.   ferrous sulfate 325 (65 FE) MG EC  tablet Take 1 tablet (325 mg total) by mouth daily with breakfast.   fexofenadine (ALLEGRA) 60 MG tablet Take 60 mg by mouth daily.   gabapentin (NEURONTIN) 300 MG capsule Take 300 mg by mouth 3 (three) times daily.   insulin aspart (NOVOLOG FLEXPEN) 100 UNIT/ML FlexPen Inject 10 Units into the skin 3 (three) times daily with meals. 8 am, 12 pm, and 6 pm   Insulin Pen Needle 30G X 5 MM MISC 1 Device by Does not apply route daily. 3/16"   ipratropium-albuterol (DUONEB) 0.5-2.5 (3) MG/3ML SOLN Take 3 mLs by  nebulization every 6 (six) hours as needed (wheezing).   lamoTRIgine (LAMICTAL) 25 MG tablet 50 mg BID for 2 weeks then increase to 75mg  BID for 2 weeks then increase to 100mg  BID   LANTUS SOLOSTAR 100 UNIT/ML Solostar Pen Inject 45 Units into the skin at bedtime.   levETIRAcetam (KEPPRA) 500 MG tablet Take 500 mg by mouth 2 (two) times daily. Renal dose   linaclotide (LINZESS) 145 MCG CAPS capsule Take 145 mcg by mouth daily as needed.   LORazepam (ATIVAN) 2 MG/ML injection Inject 0.5 mLs (1 mg total) into the muscle every 15 (fifteen) minutes as needed.   melatonin 5 MG TABS Take 5 mg by mouth at bedtime.   Multiple Vitamins-Minerals (THEREMS M PO) Take 1 tablet by mouth daily. (multivitamin with folic acid) tablet; 400 mcg;   NON FORMULARY Diet - Regular   omeprazole (PRILOSEC) 40 MG capsule Take 40 mg by mouth daily.   Phenylephrine-Cocoa Butter 0.25-85.5 % SUPP Place 1 suppository rectally 2 (two) times daily as needed.   rOPINIRole (REQUIP) 1 MG tablet Take 1 mg by mouth at bedtime.   rosuvastatin (CRESTOR) 20 MG tablet Take 20 mg by mouth every evening.   sodium bicarbonate 650 MG tablet Take 1 tablet (650 mg total) by mouth 2 (two) times daily.   tiZANidine (ZANAFLEX) 2 MG tablet Take 2 mg by mouth every 6 (six) hours as needed for muscle spasms (for back and sciatic pain).   zinc oxide 20 % ointment Apply 1 Application topically See admin instructions. Apply every shift to bilateral  buttocks, sacrum, and coccyx   No facility-administered encounter medications on file as of 07/19/2023.    ASSESSMENT/ PLAN:  Quadriplegia with indwelling Foley Recurrent UTI vs. Chronic colonization Stable. Completed Ciprofloxacin 5 day course from 10/30- 11/4 for urine culture proteus mirabilis (sensitivities showed susceptibility) - Monitor for fever, urinary symptoms; would otherwise avoid recurrent antibiotic use - Outpatient urology following  Hypertension BP near goal, 130s-150s/80s-90s. - Continue Amlodipine 10 mg daily  CKD stage 4 Last BMP with decline in GFR to 26 and Cr 1.88 - Check BMP  - Avoid nephrotoxic medications (NSAIDS) and caution with Tizanidine use given renal impairment - Continue sodium bicarbonate   Constipation with chronic LLQ abdominal pain -Start Senna -Discontinue Prilosec  Seizure disorder Stable. - Continue Keppra 500 mg BID - Continue Lamotrigine 100 mg daily - PRN IM Ativan for seizures  T2DM Last A1c 8/29 10.6%. Fasting CBG 90s-150s, generally acceptable. -Due for A1c at the end of this month; goal A1c 7.0-8.0% - Continue Lantus 45 units daily - Continue Novolog 10 units TID with meals - Continue CBG checks daily, fasting - Reduce Farxiga to 5 mg daily (given renal impairment and increased UTI risk) - Continue gabapentin 300 mg daily (appropriately renally dosed)   Hyperlipidemia Last lipid panel 06/24/2023, LDL at goal 64. - Continue Rosuvastatin 20 mg daily   Superficial thrombophlebitis of right upper extremity Right upper extremity US negative for DVT 02/26/2023 Currently taking Eliquis 5 mg BID (initiated on 02/26/2023)  Previously taking Eliquis 2.5 mg BID for elevated D-dimer from 03/18/2021-04/16/2021. - Given history of severe anemia and recurrent hematuria in the past, in addition to clot having been superficial, without evidence of deep vein thrombosis, discontinue Eliquis   Anemia, likely of chronic disease Last Hgb  10.0 on 03/23/23. - Check CBC  - Continue ferrous sulfate 325 mg MWF    Darral Dash, DO PGY-3 [facility]  [facility]  Contact [phone] Monday through Friday 8am- 5pm  After hours call [phone]

## 2023-07-21 NOTE — Addendum Note (Signed)
Addended by: Sharee Holster on: 07/21/2023 10:25 AM   Modules accepted: Level of Service

## 2023-07-26 ENCOUNTER — Encounter: Payer: Self-pay | Admitting: Gastroenterology

## 2023-07-26 ENCOUNTER — Encounter: Payer: Self-pay | Admitting: Adult Health

## 2023-07-26 ENCOUNTER — Telehealth: Payer: Self-pay | Admitting: *Deleted

## 2023-07-26 ENCOUNTER — Ambulatory Visit (INDEPENDENT_AMBULATORY_CARE_PROVIDER_SITE_OTHER): Payer: Medicare HMO | Admitting: Gastroenterology

## 2023-07-26 ENCOUNTER — Non-Acute Institutional Stay (SKILLED_NURSING_FACILITY): Payer: Self-pay | Admitting: Adult Health

## 2023-07-26 ENCOUNTER — Other Ambulatory Visit (HOSPITAL_COMMUNITY)
Admission: RE | Admit: 2023-07-26 | Discharge: 2023-07-26 | Disposition: A | Discharge status: Home / Self Care | Payer: Medicare HMO | Source: Skilled Nursing Facility | Attending: Adult Health | Admitting: Adult Health

## 2023-07-26 VITALS — BP 158/70 | HR 69 | Temp 97.6°F | Ht 62.0 in | Wt 186.7 lb

## 2023-07-26 DIAGNOSIS — E1142 Type 2 diabetes mellitus with diabetic polyneuropathy: Secondary | ICD-10-CM

## 2023-07-26 DIAGNOSIS — M51361 Other intervertebral disc degeneration, lumbar region with lower extremity pain only: Secondary | ICD-10-CM

## 2023-07-26 DIAGNOSIS — D649 Anemia, unspecified: Secondary | ICD-10-CM

## 2023-07-26 DIAGNOSIS — M87051 Idiopathic aseptic necrosis of right femur: Secondary | ICD-10-CM | POA: Diagnosis not present

## 2023-07-26 DIAGNOSIS — R1032 Left lower quadrant pain: Secondary | ICD-10-CM

## 2023-07-26 DIAGNOSIS — N184 Chronic kidney disease, stage 4 (severe): Secondary | ICD-10-CM

## 2023-07-26 DIAGNOSIS — I129 Hypertensive chronic kidney disease with stage 1 through stage 4 chronic kidney disease, or unspecified chronic kidney disease: Secondary | ICD-10-CM | POA: Diagnosis not present

## 2023-07-26 DIAGNOSIS — K5732 Diverticulitis of large intestine without perforation or abscess without bleeding: Secondary | ICD-10-CM | POA: Diagnosis not present

## 2023-07-26 DIAGNOSIS — K5900 Constipation, unspecified: Secondary | ICD-10-CM

## 2023-07-26 DIAGNOSIS — M87052 Idiopathic aseptic necrosis of left femur: Secondary | ICD-10-CM | POA: Diagnosis not present

## 2023-07-26 DIAGNOSIS — G8929 Other chronic pain: Secondary | ICD-10-CM | POA: Diagnosis not present

## 2023-07-26 DIAGNOSIS — Z794 Long term (current) use of insulin: Secondary | ICD-10-CM

## 2023-07-26 DIAGNOSIS — N189 Chronic kidney disease, unspecified: Secondary | ICD-10-CM | POA: Diagnosis not present

## 2023-07-26 DIAGNOSIS — K219 Gastro-esophageal reflux disease without esophagitis: Secondary | ICD-10-CM

## 2023-07-26 DIAGNOSIS — R109 Unspecified abdominal pain: Secondary | ICD-10-CM

## 2023-07-26 DIAGNOSIS — E1122 Type 2 diabetes mellitus with diabetic chronic kidney disease: Secondary | ICD-10-CM

## 2023-07-26 LAB — BASIC METABOLIC PANEL
Anion gap: 9 (ref 5–15)
BUN: 30 mg/dL — ABNORMAL HIGH (ref 8–23)
CO2: 24 mmol/L (ref 22–32)
Calcium: 9 mg/dL (ref 8.9–10.3)
Chloride: 107 mmol/L (ref 98–111)
Creatinine, Ser: 1.54 mg/dL — ABNORMAL HIGH (ref 0.44–1.00)
GFR, Estimated: 33 mL/min — ABNORMAL LOW (ref >60)
Glucose, Bld: 39 mg/dL — CL (ref 70–99)
Potassium: 4.7 mmol/L (ref 3.5–5.1)
Sodium: 140 mmol/L (ref 135–145)

## 2023-07-26 LAB — HEMOGLOBIN A1C
Hgb A1c MFr Bld: 7.1 % — ABNORMAL HIGH (ref 4.8–5.6)
Mean Plasma Glucose: 157.07 mg/dL

## 2023-07-26 NOTE — Patient Instructions (Signed)
We will get you scheduled for colonoscopy in the near future with Dr. Marletta Lor.  We will send necessary medication adjustments to the [facility].  No changes to current bowel regimen.  We will determine follow-up after the procedure.  It was a pleasure to see you today. I want to create trusting relationships with patients. If you receive a survey regarding your visit,  I greatly appreciate you taking time to fill this out on paper or through your MyChart. I value your feedback.  Brooke Bonito, MSN, FNP-BC, AGACNP-BC [facility]

## 2023-07-26 NOTE — Progress Notes (Unsigned)
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 She is tolerating being off PPI. She does get out of bed most days. There are no reports of uncontrolled pain.   Past Medical History:  Diagnosis Date   Acute cystitis without hematuria    Adult failure to thrive    Anemia    Anxiety    Atherosclerosis of aorta (HCC)    Central cord syndrome at C4 level of cervical spinal cord, subsequent encounter (HCC)    Chronic kidney disease, stage IV (severe) (HCC)    CKD (chronic kidney disease)    stage 3   Depression    DM type 2 with diabetic peripheral neuropathy (HCC)    Dysphagia    GERD (gastroesophageal reflux disease)    Gout    High cholesterol    HTN (hypertension)    Hyponatremia    MDD (major depressive disorder)    Neck pain    Neuropathy    Quadriplegia, C1-C4 incomplete (HCC)    Radiculopathy    RLS (restless legs syndrome)    Unspecified convulsions (HCC)    Urinary retention     Past Surgical History:  Procedure Laterality Date   ABDOMINAL HYSTERECTOMY     ANTERIOR CERVICAL  DECOMP/DISCECTOMY FUSION N/A 02/05/2021   Procedure: Cervical Three-Four  Anterior cervical decompression/discectomy/fusion;  Surgeon: Coletta Memos, MD;  Location: [facility] OR;  Service: Neurosurgery;  Laterality: N/A;  RM 20   APPENDECTOMY     BACK SURGERY     BALLOON DILATION N/A 11/19/2022   Procedure: BALLOON DILATION;  Surgeon: Lanelle Bal, DO;  Location: [facility];  Service: Endoscopy;  Laterality: N/A;   BIOPSY  09/22/2021   Procedure: BIOPSY;  Surgeon: Lanelle Bal, DO;  Location: [facility];  Service: Endoscopy;;   BIOPSY  11/19/2022   Procedure: BIOPSY;  Surgeon: Lanelle Bal, DO;  Location: [facility];  Service: Endoscopy;;   CERVICAL DISC SURGERY     ESOPHAGOGASTRODUODENOSCOPY (EGD) WITH PROPOFOL N/A 09/22/2021   Procedure: ESOPHAGOGASTRODUODENOSCOPY (EGD) WITH PROPOFOL;  Surgeon: Lanelle Bal, DO;  Location: [facility];  Service: Endoscopy;  Laterality: N/A;  1:00pm   ESOPHAGOGASTRODUODENOSCOPY (EGD) WITH PROPOFOL N/A 11/19/2022   Procedure: ESOPHAGOGASTRODUODENOSCOPY (EGD) WITH PROPOFOL;  Surgeon: Lanelle Bal, DO;  Location: [facility];  Service: Endoscopy;  Laterality: N/A;  11:30 am, asa 3   HAND SURGERY     KNEE SURGERY  Social History   Socioeconomic History   Marital status: Widowed    Spouse name: Not on file   Number of children: Not on file   Years of education: Not on file   Highest education level: Not on file  Occupational History   Not on file  Tobacco Use   Smoking status: Never   Smokeless tobacco: Never  Vaping Use   Vaping status: Never Used  Substance and Sexual Activity   Alcohol use: Never   Drug use: Never   Sexual activity: Not Currently  Other Topics Concern   Not on file  Social History Narrative   Lives at [facility]   Social Determinants of Health   Financial Resource Strain: Not on file  Food Insecurity: No Food Insecurity (02/20/2023)   Hunger Vital Sign    Worried About Running Out of  Food in the Last Year: Never true    Ran Out of Food in the Last Year: Never true  Transportation Needs: No Transportation Needs (02/20/2023)   PRAPARE - Administrator, Civil Service (Medical): No    Lack of Transportation (Non-Medical): No  Physical Activity: Not on file  Stress: Not on file  Social Connections: Not on file  Intimate Partner Violence: Not At Risk (02/20/2023)   Humiliation, Afraid, Rape, and Kick questionnaire    Fear of Current or Ex-Partner: No    Emotionally Abused: No    Physically Abused: No    Sexually Abused: No   Family History  Problem Relation Age of Onset   Cancer Mother    Cardiomyopathy Father    Seizures Sister        childhood   Seizures Brother        in his 30s   Colon cancer Neg Hx       VITAL SIGNS BP 128/80   Pulse 82   Temp (!) 97.1 F (36.2 C)   Resp 20   Ht 5\' 2"  (1.575 m)   Wt 186 lb 11.2 oz (84.7 kg)   SpO2 97%   BMI 34.15 kg/m   Outpatient Encounter Medications as of 07/26/2023  Medication Sig   dapagliflozin propanediol (FARXIGA) 5 MG TABS tablet Take 5 mg by mouth daily.   acetaminophen (TYLENOL) 325 MG tablet Take 650 mg by mouth 3 (three) times daily.   AMBULATORY NON FORMULARY MEDICATION Medication Name:  Continue monthly catheter changes with 33fr catheter at skilled nursing facility.   amLODipine (NORVASC) 10 MG tablet Take 10 mg by mouth daily.   apixaban (ELIQUIS) 5 MG TABS tablet Take 5 mg by mouth 2 (two) times daily.   augmented betamethasone dipropionate (DIPROLENE-AF) 0.05 % ointment Apply 1 Application topically 2 (two) times daily as needed. Apply to stomach and both breast   calcium-vitamin D (OSCAL WITH D) 500-5 MG-MCG tablet Take 1 tablet by mouth 2 (two) times daily.   camphor-menthol (SARNA) lotion Apply 1 Application topically 2 (two) times daily as needed for itching.   DULoxetine (CYMBALTA) 20 MG capsule Take 20 mg by mouth daily.   ferrous sulfate 325 (65 FE) MG EC tablet Take 1 tablet  (325 mg total) by mouth daily with breakfast.   fexofenadine (ALLEGRA) 60 MG tablet Take 60 mg by mouth daily.   gabapentin (NEURONTIN) 300 MG capsule Take 300 mg by mouth 3 (three) times daily.   insulin aspart (NOVOLOG FLEXPEN) 100 UNIT/ML FlexPen Inject 10 Units into the skin 3 (three) times daily with meals. 8 am,  12 pm, and 6 pm   Insulin Pen Needle 30G X 5 MM MISC 1 Device by Does not apply route daily. 3/16"   ipratropium-albuterol (DUONEB) 0.5-2.5 (3) MG/3ML SOLN Take 3 mLs by nebulization every 6 (six) hours as needed (wheezing).   lamoTRIgine (LAMICTAL) 25 MG tablet  100mg  BID   LANTUS SOLOSTAR 100 UNIT/ML Solostar Pen Inject 45 Units into the skin at bedtime.   levETIRAcetam (KEPPRA) 500 MG tablet Take 500 mg by mouth 2 (two) times daily. Renal dose   linaclotide (LINZESS) 145 MCG CAPS capsule Take 145 mcg by mouth daily as needed.   LORazepam (ATIVAN) 2 MG/ML injection Inject 0.5 mLs (1 mg total) into the muscle every 15 (fifteen) minutes as needed.   melatonin 5 MG TABS Take 5 mg by mouth at bedtime.   NON FORMULARY Diet - Regular   Phenylephrine-Cocoa Butter 0.25-85.5 % SUPP Place 1 suppository rectally 2 (two) times daily as needed.   rOPINIRole (REQUIP) 1 MG tablet Take 1 mg by mouth at bedtime.   rosuvastatin (CRESTOR) 20 MG tablet Take 20 mg by mouth every evening.   sodium bicarbonate 650 MG tablet Take 1 tablet (650 mg total) by mouth 2 (two) times daily.   tiZANidine (ZANAFLEX) 2 MG tablet Take 2 mg by mouth every 6 (six) hours as needed for muscle spasms (for back and sciatic pain).   zinc oxide 20 % ointment Apply 1 Application topically See admin instructions. Apply every shift to bilateral buttocks, sacrum, and coccyx   [DISCONTINUED] dapagliflozin propanediol (FARXIGA) 10 MG TABS tablet Take 10 mg by mouth daily.   [DISCONTINUED] feeding supplement (ENSURE ENLIVE / ENSURE PLUS) LIQD Take 237 mLs by mouth daily. (Patient not taking: Reported on 07/26/2023)    [DISCONTINUED] Multiple Vitamins-Minerals (THEREMS M PO) Take 1 tablet by mouth daily. (multivitamin with folic acid) tablet; 400 mcg; (Patient not taking: Reported on 07/26/2023)   No facility-administered encounter medications on file as of 07/26/2023.     SIGNIFICANT DIAGNOSTIC EXAMS  PREVIOUS   04-15-22; dexa: t score -3.391  NO NEW EXAMS    LABS REVIEWED PREVIOUS   07-09-22: wbc 9.3; hgb 8.5; hct 26.9; mcv 96.8 plt 196; glucose 200; bun 65; creat 2.15; k+ 5.0; na++ 128; ca 7.8; gfr 22 07-10-22: glucose 130; bun 59; creat 1.75; k+ 4.2; na++ 131; ca 7.6; gfr 29 07-30-22: urine micro-albumin 59.7; ACR 127   08-20-22: hgb A1c 8.8 10-01-22: wbc 5.6; hgb 9.6; hct 29.9; mcv 94.9 plt 245; glucose 404; bun 43; creat 1.69; k+ 5.2; na++ 135; ca 8.2 gfr 30 protein 7.2 albumin 3.5 10-19-22: glucose 176; bun 45; creat 1.63; k+ 5.8; na++ 134; ca 8.9; gfr 31 10-31-22: wbc 4.6; hgb 8.4; hct 26.5; mcv 94.0 plt 183; glucose 93; bun 46; creat 1.64; k+ 5.2; na++ 134; ca 8.6; gfr 31; protein 6.5 albumin 3.2  11-05-22: glucose 117; bun 35; creat 1.25; k+ 4.3;na++ 137; ca 7.5; gfr 43; hgb A1c 8.5; vitamin D 26.12; chol 149; ldl 73 trig 166; hdl 43  01-25-23: wbc 4.5; hgb 8.8; hct 27.0; mcv 94.7 plt 178; glucose 163; bun 45; creat 1.58; k+ 5.8; na++ 134; ca 8.7; gfr 32; protein 6.6 albumin 3.4 iron 54; tibc 305; vitamin B12: 496; keppra 80.7 (normal 10-40) 01-26-23: glucose 116; bun 41; creat 1.54; k+ 5.5; na++ 133; ca 8.5 gfr 33   02-08-23: keppra 41.6 (10-40) 02-18-23: hgb A1c 7.5; tsh 5.113 02-19-23: wbc 6.0; hgb 7.9; hct 24.7; mcv 95.7 plt 202; glucose 225;  bun 45; creat 1.76; k+ 6.4; na++ 130; ca 8.7; gfr 28; (repeat k+ 5.4) 02-20-23: wbc 6.2; hgb 7.5; hct 24.1; mcv 95.6 plt 186;glucose 195; bun 45; creat 1.76; k+ 5.6; na++ 134; ca 8.5 gfr 28; protein 6.2; albumin 3.1 free t4: 0.80; mag 1.8 02-21-23: vitamin B12 781; folate 26.1; iron 26; tibc 266; ferritin 139 03-23-23: wbc 6.3; hgb 10.0; hct 31.0; mcv 92.8 plt 186;  glucose 239; bun 41; creat 1.35; k+ 4.6; na++ 135; ca 8.7; gfr 39; protein 7.6 albumin 3.6  04-05-23: glucose 215; bun 49; creat 1.88; k+ 4.3; na++ 134; ca 8.4; gfr 26 04-29-23: hgb A1c 10.2    TODAY  06-24-23: chol 140; ldl 64; trig 190; hdl 38 06-28-23: urine culture: proteus mirabilis: cipro  07-26-23: glucose 39; bun 30; creat 1.54; k+ 4.7; na++ 140; ca 9.0; gfr 33 hgb A1c 7.1   Review of Systems  Constitutional:  Negative for malaise/fatigue.  Respiratory:  Negative for cough and shortness of breath.   Cardiovascular:  Negative for chest pain, palpitations and leg swelling.  Gastrointestinal:  Negative for abdominal pain, constipation and heartburn.  Musculoskeletal:  Negative for back pain, joint pain and myalgias.  Skin: Negative.   Neurological:  Negative for dizziness.  Psychiatric/Behavioral:  The patient is not nervous/anxious.    Physical Exam Constitutional:      General: She is not in acute distress.    Appearance: She is well-developed. She is obese. She is not diaphoretic.  Neck:     Thyroid: No thyromegaly.  Cardiovascular:     Rate and Rhythm: Normal rate and regular rhythm.     Pulses: Normal pulses.     Heart sounds: Normal heart sounds.  Pulmonary:     Effort: Pulmonary effort is normal. No respiratory distress.     Breath sounds: Normal breath sounds.  Abdominal:     General: Bowel sounds are normal. There is no distension.     Palpations: Abdomen is soft.     Tenderness: There is no abdominal tenderness.  Genitourinary:    Comments: foley Musculoskeletal:     Cervical back: Neck supple.     Right lower leg: No edema.     Left lower leg: No edema.     Comments:  Limited range of motion in upper extremities Does not move lower extremities     Lymphadenopathy:     Cervical: No cervical adenopathy.  Skin:    General: Skin is warm and dry.  Neurological:     Mental Status: She is alert. Mental status is at baseline.     Comments: 03-22-23: SLUMS 14/30    Psychiatric:        Mood and Affect: Mood normal.     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 Lumbar radicular syndrome/vascular necrosis bilateral hips; will continue tylenol 650 mg every 8 hours; gabapentin 300 mg three times daily and cymbalta 20 mg daily   3. Diabetic peripheral neuropathy: will continue gabapentin 300 mg three times daily   4. CKD stage 4 due to type 2 diabetes mellitus: bun 30; creat 1.54 gfr 33   PREVIOUS   5. Anemia associated with type 2 diabetes mellitus due to underlying condition: hgb 8.5 will monitor   6. Urine  retention/neurogenic bladder: has long term foley; followed by urology   7. Chronic anxiety: is off medications  8. Protein calorie malnutrition severe: albumin 3.6; protein 6.7 will continue supplements as indicated  9. Aortic atherosclerosis (ct 01-26-21) is on asa and statin   10. Central cord syndrome at C4 level of cervical spine compression subsequent encounter/cord compression quadriplegia C1-4 (01-25-21) asa 81 mg daily; zanaflex 2 mg every 6 hours as needed for spasticity   11. Neurocognitive deficits: 03-22-23: SLUMS 14/30.   12. Restless leg syndrome: will continue requip 1 mg nightly   13. Hypertension associated with stage 4 chronic kidney disease due to type 2 diabetes mellitus: 128/80 will continue  norvasc  10 mg daily   14. Seizure; will continue keppra 500 mg twice daily did not tolerate lower dose. Will continue lamictal 100 mg twice daily  neurology is aware  15. Hyperlipidemia associated with type 2 diabetes mellitus: ldl 73 will continue crestor 20 mg daily will check lipids  16. Post menopausal osteoporosis: t score -3.391   17. GERD without esophagitis: is off medications    18. Chronic  constipation: will continue miralax daily and linzess 145 mcg daily as needed    Synthia Innocent NP [facility]   call [phone]

## 2023-07-26 NOTE — Telephone Encounter (Signed)
  Request for patient to stop medication prior to procedure or is needing cleareance  07/26/23  [name] Jun 13, 1936  What type of surgery is being performed? Colonoscopy  When is surgery scheduled? TBD  What type of clearance is required (medical or pharmacy to hold medication or both? medication  Are there any medications that need to be held prior to surgery and how long? Eliquis x 2 days  Name of physician performing surgery?  Dr.Carver [facility] at [employer]: [phone] Fax: [phone]  Anethesia type (none, local, MAC, general)? MAC

## 2023-07-26 NOTE — Progress Notes (Signed)
GI Office Note    Referring Provider: Sharee Holster, NP Primary Care Physician:  Sharee Holster, NP Primary Gastroenterologist: Hennie Duos. Marletta Lor, DO Date:  07/26/2023  ID:  [name], DOB 02/12/1936, MRN [id number]   Chief Complaint   Chief Complaint  Patient presents with   Follow-up    Still having left side pain   History of Present Illness  [name] is a 87 y.o. female with a history of CKD (with recent progression to stage IV), anemia of chronic disease, diabetes, HTN, HLD, central cord syndrome at C4 secondary to fall in May 2022 presenting today with complaint of   EGD 09/22/21: -gastritis s/p biopsy -normal duodenum -omeprazole 40 mg daily -Reglan 5 mg QID   Two ED visits recently for constipation and abdominal pain earlier this month. CT as outlined below. Treated with soap suds enema one instance. No changes to bowel regimen made, continue metamucil.   CT A/P 10/01/22: -mild fullness in right renal collecting system -changes consistent with rectal impaction   LFTs normal. Hgb 9.6, Glucose 404 on 10/01/22.    OV 10/20/22. Seen for rectal pain and abdominal pain.  Abdominal pain reported as being sharp.  Daughter reported issues since she had a fall.  Starts in the left and radiates to her left upper abdomen.  Has some nausea but no vomiting.  Reports some esophageal burning and mild dysphagia due to issues swallowing.  Patient was unsure of her frequency of bowel movements.  States poor appetite due to pain in all the likely change in food choices at SNF.  Also reported bloating and belching.  Having intermittent rectal pain.  History of spinal cord injury since fall.  Added Linzess 72 mcg daily, continue MiraLAX daily.  Advised suppository once weekly given CT evidence of infection.  Scheduled for EGD with dilation per patient's request to evaluate her swallowing and upper abdominal pain.  Omeprazole increased to 40 mg twice daily from 20 mg twice daily.    EGD 11/19/22: -Gastritis s/p biopsy -Normal duodenum -No esophageal stenosis or stricture -Biopsies negative for H. Pylori, hyperplasia seen consistent with PPI therapy.  -Advised PPI twice daily   Last office visit 12/30/22.  Still with complaints of constipation, lack of appetite, mild dysphagia, and left-sided abdominal pain.  Advised to obtain KUB to assess for constipation, continue hemorrhoid cream as needed, continue PPI twice daily.  Discussed possibly increasing Linzess depending on KUB and start MiraLAX twice daily.  Advised to offer Ensure 1-2 times per day and monitor weight frequently.   KUB 12/30/22: Nonobstructive bowel gas pattern, scattered stool in the colon.   Received notification from the primary NP after her last office visit he reported that her appetite has been okay and her weight has been stable since January and that she was having a daily bowel movement while being on MiraLAX once a day and Linzess once daily and she requested that we keep her on MiraLAX once daily instead of increasing to twice daily.  She also discussed discontinuing ensures given her appetite appeared to be good.   Hospitalization 6/21 for complicated UTI for which she had CT imaging that showed diverticular disease of the left colon with minimal fat stranding adjacent to the descending colon suggestive of mild diverticulitis and mild bladder wall thickening concerning for possible cystitis.  She was treated with Augmentin for her diverticulitis and was initially treated with Zosyn.  Foley exchanged and she was recommended to have cystoscopy outpatient.  Baseline hemoglobin noted to be 8 and she was given 1 unit PRBC during this hospital admission and her iron studies showed low iron which was also replaced while inpatient and was discharged on oral iron supplementation.  Last office visit 03/02/23.  Still having chronic left-sided abdominal pain.  Reported regular bowel movements.  Also had some intermittent  diarrhea given she was on antibiotics.  Does not like taste of foods at the facility as they are not good to her liking or season to her liking.  Denied any nausea or vomiting.  Did report some mild pill dysphagia but denied any heartburn.  Patient reports a poor appetite as well although patient and daughter also state that the quality of food at the facility is not to her liking therefore she does not have much interest in eating.  Patient feels like she needs more that she will she would feel better overall as well as a vitamin supplement.  Patient's primary provider Synthia Innocent, NP who sees patient regularly at SNF has reported that all staff states that patient eats the majority of all of her meals without issue and does not have any significant lack of appetite therefore they had discontinued her Ensure supplements previously.  She was recently seen at the SNF by [facility] 11/18 at which time she again reported chronic left-sided abdominal pain and constipation and requesting to have a bowel movement.  Noted to be eating well without difficulty, had recently celebrated her birthday early.  Given further decline in renal function (CKD stage IV) she was advised to avoid NSAIDs and advised to continue sodium bicarbonate.  Given constipation with left lower quadrant abdominal pain they decided to start senna and discontinue Prilosec.  They will continue daily iron supplementation for anemia.  Last hemoglobin 10 in July 2024 and we will check another CBC.  They have began renally dosing all of her medications as well.  They also decided to discontinue Eliquis given no recent evidence of DVT and history of severe anemia with recurrent hematuria in the past.  Also discussed colonoscopy versus flexible sigmoidoscopy with PCP and she thought that prep would not be feasible for patient however if patient insisted that they would at least attempt this.  She also agreed to increase her Linzess dose to  help with constipation.  A1c improved to 7.1 today.  On morning labs her glucose was noted to be 39, BUN 30, creatinine 1.54 which appears to be around her baseline.  Today: Constipation, abdominal pain - Bms make her tail sore but does have relief of abdomianl pain somewhat after.  She was started on Senokot at the [facility].  Her daughter states that patient expressed to her yesterday that she had 3 bowel movements.  She does feel like certain foods may be make her more constipated at times.  There have not been any reports of melena or BRBPR.  Denies any upper GI symptoms today.  Recently did have a birthday dinner where she still did not eat much given she just does not like the taste/seasoning of some other peoples food.    Wt Readings from Last 3 Encounters:  07/26/23 186 lb 11.2 oz (84.7 kg)  07/26/23 186 lb 11.2 oz (84.7 kg)  06/30/23 187 lb (84.8 kg)    Current Outpatient Medications  Medication Sig Dispense Refill   acetaminophen (TYLENOL) 325 MG tablet Take 650 mg by mouth 3 (three) times daily.     AMBULATORY NON FORMULARY MEDICATION Medication  Name:  Continue monthly catheter changes with 86fr catheter at skilled nursing facility. 1 application MONTHLY   amLODipine (NORVASC) 10 MG tablet Take 10 mg by mouth daily.     apixaban (ELIQUIS) 5 MG TABS tablet Take 5 mg by mouth 2 (two) times daily.     augmented betamethasone dipropionate (DIPROLENE-AF) 0.05 % ointment Apply 1 Application topically 2 (two) times daily as needed. Apply to stomach and both breast     calcium-vitamin D (OSCAL WITH D) 500-5 MG-MCG tablet Take 1 tablet by mouth 2 (two) times daily.     camphor-menthol (SARNA) lotion Apply 1 Application topically 2 (two) times daily as needed for itching.     dapagliflozin propanediol (FARXIGA) 10 MG TABS tablet Take 10 mg by mouth daily.     DULoxetine (CYMBALTA) 20 MG capsule Take 20 mg by mouth daily.     ferrous sulfate 325 (65 FE) MG EC tablet Take 1 tablet (325  mg total) by mouth daily with breakfast. 60 tablet 3   fexofenadine (ALLEGRA) 60 MG tablet Take 60 mg by mouth daily.     gabapentin (NEURONTIN) 300 MG capsule Take 300 mg by mouth 3 (three) times daily.     insulin aspart (NOVOLOG FLEXPEN) 100 UNIT/ML FlexPen Inject 10 Units into the skin 3 (three) times daily with meals. 8 am, 12 pm, and 6 pm     Insulin Pen Needle 30G X 5 MM MISC 1 Device by Does not apply route daily. 3/16"  0   ipratropium-albuterol (DUONEB) 0.5-2.5 (3) MG/3ML SOLN Take 3 mLs by nebulization every 6 (six) hours as needed (wheezing).     lamoTRIgine (LAMICTAL) 25 MG tablet 50 mg BID for 2 weeks then increase to 75mg  BID for 2 weeks then increase to 100mg  BID 380 tablet 0   LANTUS SOLOSTAR 100 UNIT/ML Solostar Pen Inject 45 Units into the skin at bedtime.     levETIRAcetam (KEPPRA) 500 MG tablet Take 500 mg by mouth 2 (two) times daily. Renal dose     linaclotide (LINZESS) 145 MCG CAPS capsule Take 145 mcg by mouth daily as needed.     LORazepam (ATIVAN) 2 MG/ML injection Inject 0.5 mLs (1 mg total) into the muscle every 15 (fifteen) minutes as needed. 4 mL 0   melatonin 5 MG TABS Take 5 mg by mouth at bedtime.     NON FORMULARY Diet - Regular     omeprazole (PRILOSEC) 40 MG capsule Take 40 mg by mouth daily.     Phenylephrine-Cocoa Butter 0.25-85.5 % SUPP Place 1 suppository rectally 2 (two) times daily as needed.     rOPINIRole (REQUIP) 1 MG tablet Take 1 mg by mouth at bedtime.     rosuvastatin (CRESTOR) 20 MG tablet Take 20 mg by mouth every evening.     senna (SENOKOT) 8.6 MG TABS tablet Take 1 tablet by mouth at bedtime.     sodium bicarbonate 650 MG tablet Take 1 tablet (650 mg total) by mouth 2 (two) times daily.     tiZANidine (ZANAFLEX) 2 MG tablet Take 2 mg by mouth every 6 (six) hours as needed for muscle spasms (for back and sciatic pain).     zinc oxide 20 % ointment Apply 1 Application topically See admin instructions. Apply every shift to bilateral buttocks,  sacrum, and coccyx     No current facility-administered medications for this visit.    Past Medical History:  Diagnosis Date   Acute cystitis without hematuria    Adult  failure to thrive    Anemia    Anxiety    Atherosclerosis of aorta (HCC)    Central cord syndrome at C4 level of cervical spinal cord, subsequent encounter (HCC)    Chronic kidney disease, stage IV (severe) (HCC)    CKD (chronic kidney disease)    stage 3   Depression    DM type 2 with diabetic peripheral neuropathy (HCC)    Dysphagia    GERD (gastroesophageal reflux disease)    Gout    High cholesterol    HTN (hypertension)    Hyponatremia    MDD (major depressive disorder)    Neck pain    Neuropathy    Quadriplegia, C1-C4 incomplete (HCC)    Radiculopathy    RLS (restless legs syndrome)    Unspecified convulsions (HCC)    Urinary retention     Past Surgical History:  Procedure Laterality Date   ABDOMINAL HYSTERECTOMY     ANTERIOR CERVICAL DECOMP/DISCECTOMY FUSION N/A 02/05/2021   Procedure: Cervical Three-Four  Anterior cervical decompression/discectomy/fusion;  Surgeon: Coletta Memos, MD;  Location: [facility] OR;  Service: Neurosurgery;  Laterality: N/A;  RM 20   APPENDECTOMY     BACK SURGERY     BALLOON DILATION N/A 11/19/2022   Procedure: BALLOON DILATION;  Surgeon: Lanelle Bal, DO;  Location: [facility];  Service: Endoscopy;  Laterality: N/A;   BIOPSY  09/22/2021   Procedure: BIOPSY;  Surgeon: Lanelle Bal, DO;  Location: [facility];  Service: Endoscopy;;   BIOPSY  11/19/2022   Procedure: BIOPSY;  Surgeon: Lanelle Bal, DO;  Location: [facility];  Service: Endoscopy;;   CERVICAL DISC SURGERY     ESOPHAGOGASTRODUODENOSCOPY (EGD) WITH PROPOFOL N/A 09/22/2021   Procedure: ESOPHAGOGASTRODUODENOSCOPY (EGD) WITH PROPOFOL;  Surgeon: Lanelle Bal, DO;  Location: [facility];  Service: Endoscopy;  Laterality: N/A;  1:00pm   ESOPHAGOGASTRODUODENOSCOPY (EGD) WITH PROPOFOL N/A 11/19/2022    Procedure: ESOPHAGOGASTRODUODENOSCOPY (EGD) WITH PROPOFOL;  Surgeon: Lanelle Bal, DO;  Location: [facility];  Service: Endoscopy;  Laterality: N/A;  11:30 am, asa 3   HAND SURGERY     KNEE SURGERY      Family History  Problem Relation Age of Onset   Cancer Mother    Cardiomyopathy Father    Seizures Sister        childhood   Seizures Brother        in his 54s   Colon cancer Neg Hx     Allergies as of 07/26/2023 - Review Complete 07/26/2023  Allergen Reaction Noted   Ace inhibitors Other (See Comments) 08/29/2021   Codeine  03/09/2018   Sulfa antibiotics  03/25/2021    Social History   Socioeconomic History   Marital status: Widowed    Spouse name: Not on file   Number of children: Not on file   Years of education: Not on file   Highest education level: Not on file  Occupational History   Not on file  Tobacco Use   Smoking status: Never   Smokeless tobacco: Never  Vaping Use   Vaping status: Never Used  Substance and Sexual Activity   Alcohol use: Never   Drug use: Never   Sexual activity: Not Currently  Other Topics Concern   Not on file  Social History Narrative   Lives at [facility]   Social Determinants of Health   Financial Resource Strain: Not on file  Food Insecurity: No Food Insecurity (02/20/2023)  Hunger Vital Sign    Worried About Running Out of Food in the Last Year: Never true    Ran Out of Food in the Last Year: Never true  Transportation Needs: No Transportation Needs (02/20/2023)   PRAPARE - Administrator, Civil Service (Medical): No    Lack of Transportation (Non-Medical): No  Physical Activity: Not on file  Stress: Not on file  Social Connections: Not on file     Review of Systems   Gen: Denies fever, chills, anorexia. Denies fatigue, weakness, weight loss.  CV: Denies chest pain, palpitations, syncope, peripheral edema, and claudication. Resp: Denies dyspnea at rest, cough, wheezing, coughing up  blood, and pleurisy. GI: See HPI Derm: Denies rash, itching, dry skin Psych: Denies depression, anxiety, memory loss, confusion. No homicidal or suicidal ideation.  Heme: Denies bruising, bleeding, and enlarged lymph nodes.  Physical Exam   BP (!) 158/70 (BP Location: Left Arm, Patient Position: Sitting, Cuff Size: Normal)   Pulse 69   Temp 97.6 F (36.4 C) (Oral)   Ht 5\' 2"  (1.575 m)   Wt 186 lb 11.2 oz (84.7 kg) Comment: facility reported 07/02/23  SpO2 97%   BMI 34.15 kg/m   General:   Alert and oriented. No distress noted. Pleasant and cooperative.  Head:  Normocephalic and atraumatic. Eyes:  Conjuctiva clear without scleral icterus. Abdomen:  +BS, soft, non-distended, rounded.  Mild TTP to left lower quadrant and left upper quadrant.  No rebound or guarding. No HSM or masses noted. Rectal: deferred Msk: No movement to bilateral lower extremities. Extremities:  Without edema. Neurologic:  Alert and  oriented x4 Psych:  Alert and cooperative. Normal mood and affect.  Assessment  [name] is a 87 y.o. female with a history of CKD (with recent progression to stage IV), anemia of chronic disease, diabetes, HTN, HLD, central cord syndrome at C4 secondary to fall in May 2022 presenting today for follow-up of constipation, abdominal pain.  Constipation, left lower quadrant abdominal pain, history of diverticulitis: Recently treated earlier this year in June., had CT evidence of mild diverticulitis while inpatient for UTI.  Struggles from baseline constipation given her lack of mobility given central cord syndrome.  She is currently maintained on Linzess as well as Senokot.  PCP recently discontinued omeprazole given her constipation.  Given her history of diverticulitis and her chronic left lower quadrant abdominal pain, she has not had a colonoscopy therefore we will go ahead and proceed per patient's request.  Given her mobility and cognitive status she may only tolerate enema  prep for flexible sigmoidoscopy and we discussed both options today with her daughter present.  Patient opted for full colonoscopy, if not well-prepped we should at least continue with the flexible sigmoidoscopy.  We discussed that if no significant findings that this could be IBS related abdominal pain that we we will manage supportively.  GERD:  PCP recently discontinued omeprazole, symptoms previously well-controlled on medication.  Not currently complaining of any significant reflux issues.  Anemia: Currently maintained on iron 3 times weekly.  Anemia likely secondary to chronic kidney disease.  EGD in March 2024 with mild gastritis.  No record of colonoscopy, given her multiple comorbidities and immobility, prep may be difficult.  Patient has expressed wishes to complete in the past. For now we will maintain iron 3 times weekly, last CBC with hemoglobin of 10 in July 2024  PLAN   Continue daily iron supplementation M, W, F Agree with continuing Senokot daily  for constipation management Continue Linzess 145 mcg once daily Tums as needed for reflux breakthrough Proceed with colonoscopy with propofol by Dr. Marletta Lor  in near future: the risks, benefits, and alternatives have been discussed with the patient in detail. The patient states understanding and desires to proceed. ASA 3 Hold iron for 1 week prior Continue current bowel regimen Half dose of Lantus night prior Hold Farxiga for 3 days prior Check blood glucose every 4 hours If currently on Eliquis, will need to hold for 2 days prior -request clearance from PCP No regular insulin morning of procedure 1.5 days of clear liquids Tapwater enema x 2 morning of procedure TriLyte prep only Follow-up to be determined after procedure.    Brooke Bonito, MSN, FNP-BC, AGACNP-BC [facility]

## 2023-08-04 NOTE — Telephone Encounter (Signed)
Update on clearance.

## 2023-08-11 NOTE — Telephone Encounter (Signed)
Faxed to pcp °

## 2023-08-12 ENCOUNTER — Encounter: Payer: Self-pay | Admitting: Adult Health

## 2023-08-12 ENCOUNTER — Ambulatory Visit: Payer: Medicare HMO | Admitting: Adult Health

## 2023-08-12 VITALS — BP 108/60 | HR 63 | Ht 62.0 in | Wt 188.0 lb

## 2023-08-12 DIAGNOSIS — G40909 Epilepsy, unspecified, not intractable, without status epilepticus: Secondary | ICD-10-CM

## 2023-08-12 MED ORDER — LAMOTRIGINE 50 MG PO TBDP: ORAL_TABLET | ORAL | 5 refills | Status: DC

## 2023-08-12 NOTE — Patient Instructions (Signed)
Your Plan:  Increase lamotrigine dosage - continue 100mg  Am and increase PM dose to 150mg  for 2 weeks then increase to 150mg  twice daily  Continue keppra 500mg  twice daily     Follow up in 6 months or call earlier if needed     Thank you for coming to see [place] at [facility]. I hope we have been able to provide you high quality care today.  You may receive a patient satisfaction survey over the next few weeks. We would appreciate your feedback and comments so that we may continue to improve ourselves and the health of our patients.

## 2023-08-12 NOTE — Progress Notes (Signed)
[facility] [address] [place].  [id number] [phone]       OFFICE FOLLOW UP NOTE  Ms. TRENIECE KOHLHOFF Date of Birth:  08-Jun-1936 Medical Record Number:  [id number]    Primary neurologist: Dr. Frances Furbish Reason for visit: Seizure disorder    SUBJECTIVE: Chief Complaint  Patient presents with   Follow-up    Pt with grand daughter, rm 3, here today for follow up. Since being back on the medications she has had a reduction in the seizures. She states that she is having one a week      HPI:   YITA PRETTI is a 87 y.o. female who is being followed in this office by Dr. Frances Furbish for seizure disorder since her teenage years.  She is a permanent resident of [facility] with diagnoses of adult failure to thrive, chronic anemia, arthrosclerosis, cervical cord syndrome with quadriplegia, DM with stage III CKD, HLD, HTN and depression   Update 08/12/2023 JM: Patient returns for follow-up visit accompanied by her grand daughter.  Was contacted by SNF back in August the patient had recurrent seizure therefore lamotrigine dosage adjusted.  Then notified by SNF in September of low creatinine clearance and concern of Keppra dosage at 1000 mg twice daily, it was recommended to gradually increase lamotrigine dosage gradually to 100 mg twice daily and then gradually decreasing Keppra dosage to 500 mg twice daily.  Currently taking Keppra 500 mg twice daily and lamotrigine 100 mg twice daily, does have Ativan IM as needed. Granddaughter provides majority of history, reports improvement of events since dosage adjustments, having possibly about 1/week, previously occurring daily if not multiple times per day. She notes she will start to have muscle spasms prior to the event or after, sometimes seems like she is in pain prior, at times will have episodes when family is about to leave or if she gets upset.  Patient does have chronic back and hip pain as well as  peripheral neuropathy, she is currently on duloxetine, gabapentin, and tizanidine.    History provided for reference purposes only Update 04/15/2023 JM: Patient returns for seizure follow-up accompanied by her granddaughter.  At prior visit, lamotrigine discontinued per patient request and continued on Keppra 1000/1500.   SNF noted seizure in May, lab work showed Keppra level 80.7, Keppra lowered to 750mg  twice daily Recurrent seizure in July during Foley exchange at urology visit, evaluated in ED, [facility] no acute abnormality.  Increased Keppra to 1000 mg twice daily.   Granddaughter reports she has been seen in the ED multiple times since prior visit for recurrent seizures but only able to verify being seen in ED for seizure in July. Also reports recurrent seizure like activity over the past several months when patient sitting up in chair, usually for at least 20-30 min, will black out and become unresponsive. Can be associated at times with extremity jerking or shaking.  She does report this can happen daily, sometime even multiple times per day. Does report left sided mid abdominal pain more recently, granddaughter unsure if associated.  Patient unsure when last bowel movement occurred, unsure if related to abdominal pain.  She is prescribed Linzess PRN.  She has also had issues with recurrent UTIs, urology wanting to complete cystoscopy for further evaluation but family wished to wait until seizures under control.  Update 07/16/2022 JM: Patient returns for seizure follow-up visit after prior visit 7 months ago accompanied by her daughter, [name].  She continues to reside  at [facility].  Denies any seizure activity since prior visit.  Remains on Keppra 1000/1500 and lamotrigine 25 mg daily.  She is very eager to try to come off of some of her medications as she feels she takes too many.  Routine lab work at [facility].  No new concerns at this time.  Update 01/07/2022 JM: Returns for 69-month  follow-up visit accompanied by her daughter [name].  She continues to reside at [facility]. Per daughter, has had at least 4 seizures since prior visit.  Per EMR review, SNF noted seizure activity on 2/25 requiring dose of IM Ativan.  No specific triggers identified. SNF continued Keppra 1000 mg AM and 1500 mg PM and increased lamotrigine to 25 mg daily (previously 12.5 mg daily).  Per daughter, additional seizures since that time with most recent last month.  Per SNF med list, she has continued on lamotrigine 25 mg daily and Keppra 1000/1500, denies side effects. She does not wish to further make adjust to any medications as she feels as though she is already taking too many. Completed EEG 2/16 with no epileptiform discharges seen or any other abnormalities.  No further concerns at this time.   History from Dr. Frances Furbish 2 medications consult visit on 10/07/2021 copied for reference purposes only Ms. Jamrog is an 87 year old right-handed woman with an underlying complex medical history of chronic kidney disease, central cord syndrome diabetes, gout, reflux disease, hypertension, hyperlipidemia, hyponatremia, depression, anemia, overweight state, anxiety, status post multiple surgeries including neck surgery, low back surgery, knee surgery, hand surgery, hysterectomy, appendectomy, who reports a longstanding history of seizures.  She also reports a family history of seizures affecting her sister who had seizures starting at age 31 years old.  Patient reports that she was on Dilantin in the past.  She had recurrence of seizures approximately last year as I understand. I reviewed your office note from 07/28/2021, at which time she saw Benita Gutter, NP.  Of note, she is on multiple medications including lorazepam as needed, atorvastatin, Biotene as needed, chlorthalidone, colchicine, Cymbalta, gabapentin, Humalog, Lantus, melatonin, mirtazapine, omeprazole, Reglan, ropinirole, tizanidine as needed.   She is on Ventolin as needed. Per daughter, she does not have any tonic-clonic seizures, her seizures consist of involuntary mouth movements, decreased consciousness, or loss of awareness, lipsmacking, and drawing of her arms upwards above her head. She is wheelchair-bound and since last year has not been able to walk.  She has an indwelling catheter.  Per daughter, she hydrates well with water.  She has limited caffeine, 1 cup of coffee in the mornings typically.  She does not drink any alcohol.  She resides at [facility], skilled nursing facility.   I had evaluated her over 2 years ago at the request of Dr. Jenne Pane in ENT for her balance problems and gait disorder.  She has since then missed 2 appointments.  She has been seen by Dr. Gerilyn Pilgrim in the interim in 2021 but office notes were not available for my review.  She was recently seen in the emergency room on 09/28/2021 with seizures.  She presented from the [facility].  She received multiple doses of Ativan.  I reviewed the emergency room records.  She had a head CT without contrast on 09/28/2021 and I reviewed the results: Impression: No acute intracranial findings are seen in noncontrast CT brain.  Chronic sinusitis.  Bilateral mastoid effusions.   She had a head CT without contrast on 09/14/2021 with the  indication of seizure today, altered mental status.  I reviewed the results: Impression: No acute intracranial abnormality.  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  New left sphenoid sinus air-fluid level, which may be seen with acute sinusitis.  She had a brain MRI with and without contrast on 07/07/2021 with indication of possible seizure.  Abnormal neurological exam.  I reviewed the results: Impression: No acute intracranial finding. No sign of acute infarction. No lesion seen to explain seizure. The patient does have mild chronic small-vessel ischemic changes considering age affecting the pons, thalami and cerebral hemispheric white matter. Left mastoid  effusion. She has been on Keppra and Lamictal.  The Lamictal was started in late January 2023 by her primary care.  In the emergency room on 09/28/2021 she was given IV Keppra 1500 mg x 1.  She was advised to continue with Keppra and Lamictal, Keppra was increased to 1000 mg in the morning and 1500 mg in the evening.  Urinalysis was nonsuspicious for a UTI.  Laboratory evaluation showed a magnesium level of 1.8, sodium mildly low at 133, potassium 5.1, glucose 160, BUN 57, creatinine 1.33, CBC showed WBC of 6.2, hemoglobin was low at 9, hematocrit low at 27.9.  Repeat blood sugar 148. She missed interim appointments on appointment on 01/02/20 and 01/30/20.   Previously:    08/17/19: (She) reports problems with her balance for the past 2 years.  She reports that she was diagnosed with diabetes in the 70s and she started noticing occasional problems with walking straight, she would at times severe in one direction.  In the recent past she has had 2 falls.  She was in the home both times.  She fell backwards recently and hit her head against the corner of the furnace she says.  She had bent down to pick something up and when she stood up she overshot.  This happened on 07/02/2019.  On 1123, she fell in the bathroom after standing up from the toilet commode.  She does have occasional lightheadedness when standing up quickly.  She has a 4 pronged cane available and a walker from when she had back and neck surgeries but has not been using either.  She hydrates well with water, estimates that she drinks 3-4 bottles of water per day and up to 2 bottles per night.  She lives alone, she is widowed, she has  4 grown children.  She has a call alert button.  She is a non-smoker and does not drink alcohol and does not drink caffeine on a daily basis.  Her latest A1c was either 8 or 9 or 11 per her recollection.  She had blood work through her primary care physician in October.  I reviewed your office note from 07/06/2019.  She  denies any recurrent headaches or vertiginous symptoms such as spinning sensation or nausea.  She denies any sudden onset of one-sided weakness or numbness or tingling or droopy face or slurring of speech.  She has had intermittent tingling sensation in her feet and left hand.       ROS:   14 system review of systems performed and negative with exception of those listed in HPI  PMH:  Past Medical History:  Diagnosis Date   Acute cystitis without hematuria    Adult failure to thrive    Anemia    Anxiety    Atherosclerosis of aorta (HCC)    Central cord syndrome at C4 level of cervical spinal cord, subsequent encounter (HCC)  Chronic kidney disease, stage IV (severe) (HCC)    CKD (chronic kidney disease)    stage 3   Depression    DM type 2 with diabetic peripheral neuropathy (HCC)    Dysphagia    GERD (gastroesophageal reflux disease)    Gout    High cholesterol    HTN (hypertension)    Hyponatremia    MDD (major depressive disorder)    Neck pain    Neuropathy    Quadriplegia, C1-C4 incomplete (HCC)    Radiculopathy    RLS (restless legs syndrome)    Unspecified convulsions (HCC)    Urinary retention     PSH:  Past Surgical History:  Procedure Laterality Date   ABDOMINAL HYSTERECTOMY     ANTERIOR CERVICAL DECOMP/DISCECTOMY FUSION N/A 02/05/2021   Procedure: Cervical Three-Four  Anterior cervical decompression/discectomy/fusion;  Surgeon: Coletta Memos, MD;  Location: [facility] OR;  Service: Neurosurgery;  Laterality: N/A;  RM 20   APPENDECTOMY     BACK SURGERY     BALLOON DILATION N/A 11/19/2022   Procedure: BALLOON DILATION;  Surgeon: Lanelle Bal, DO;  Location: [facility];  Service: Endoscopy;  Laterality: N/A;   BIOPSY  09/22/2021   Procedure: BIOPSY;  Surgeon: Lanelle Bal, DO;  Location: [facility];  Service: Endoscopy;;   BIOPSY  11/19/2022   Procedure: BIOPSY;  Surgeon: Lanelle Bal, DO;  Location: [facility];  Service: Endoscopy;;   CERVICAL  DISC SURGERY     ESOPHAGOGASTRODUODENOSCOPY (EGD) WITH PROPOFOL N/A 09/22/2021   Procedure: ESOPHAGOGASTRODUODENOSCOPY (EGD) WITH PROPOFOL;  Surgeon: Lanelle Bal, DO;  Location: [facility];  Service: Endoscopy;  Laterality: N/A;  1:00pm   ESOPHAGOGASTRODUODENOSCOPY (EGD) WITH PROPOFOL N/A 11/19/2022   Procedure: ESOPHAGOGASTRODUODENOSCOPY (EGD) WITH PROPOFOL;  Surgeon: Lanelle Bal, DO;  Location: [facility];  Service: Endoscopy;  Laterality: N/A;  11:30 am, asa 3   HAND SURGERY     KNEE SURGERY      Social History:  Social History   Socioeconomic History   Marital status: Widowed    Spouse name: Not on file   Number of children: Not on file   Years of education: Not on file   Highest education level: Not on file  Occupational History   Not on file  Tobacco Use   Smoking status: Never   Smokeless tobacco: Never  Vaping Use   Vaping status: Never Used  Substance and Sexual Activity   Alcohol use: Never   Drug use: Never   Sexual activity: Not Currently  Other Topics Concern   Not on file  Social History Narrative   Lives at [facility]   Social Drivers of Health   Financial Resource Strain: Not on file  Food Insecurity: No Food Insecurity (02/20/2023)   Hunger Vital Sign    Worried About Running Out of Food in the Last Year: Never true    Ran Out of Food in the Last Year: Never true  Transportation Needs: No Transportation Needs (02/20/2023)   PRAPARE - Administrator, Civil Service (Medical): No    Lack of Transportation (Non-Medical): No  Physical Activity: Not on file  Stress: Not on file  Social Connections: Not on file  Intimate Partner Violence: Not At Risk (02/20/2023)   Humiliation, Afraid, Rape, and Kick questionnaire    Fear of Current or Ex-Partner: No    Emotionally Abused: No    Physically Abused: No    Sexually Abused: No  Family History:  Family History  Problem Relation Age of Onset   Cancer Mother     Cardiomyopathy Father    Seizures Sister        childhood   Seizures Brother        in his 61s   Colon cancer Neg Hx     Medications:   Current Outpatient Medications on File Prior to Visit  Medication Sig Dispense Refill   acetaminophen (TYLENOL) 325 MG tablet Take 650 mg by mouth 3 (three) times daily.     AMBULATORY NON FORMULARY MEDICATION Medication Name:  Continue monthly catheter changes with 22fr catheter at skilled nursing facility. 1 application MONTHLY   amLODipine (NORVASC) 10 MG tablet Take 10 mg by mouth daily.     apixaban (ELIQUIS) 5 MG TABS tablet Take 5 mg by mouth 2 (two) times daily.     augmented betamethasone dipropionate (DIPROLENE-AF) 0.05 % ointment Apply 1 Application topically 2 (two) times daily as needed. Apply to stomach and both breast     calcium-vitamin D (OSCAL WITH D) 500-5 MG-MCG tablet Take 1 tablet by mouth 2 (two) times daily.     camphor-menthol (SARNA) lotion Apply 1 Application topically 2 (two) times daily as needed for itching.     dapagliflozin propanediol (FARXIGA) 5 MG TABS tablet Take 5 mg by mouth daily.     DULoxetine (CYMBALTA) 20 MG capsule Take 20 mg by mouth daily.     ferrous sulfate 325 (65 FE) MG EC tablet Take 1 tablet (325 mg total) by mouth daily with breakfast. 60 tablet 3   fexofenadine (ALLEGRA) 60 MG tablet Take 60 mg by mouth daily.     gabapentin (NEURONTIN) 300 MG capsule Take 300 mg by mouth 3 (three) times daily.     insulin aspart (NOVOLOG FLEXPEN) 100 UNIT/ML FlexPen Inject 10 Units into the skin 3 (three) times daily with meals. 8 am, 12 pm, and 6 pm     Insulin Pen Needle 30G X 5 MM MISC 1 Device by Does not apply route daily. 3/16"  0   ipratropium-albuterol (DUONEB) 0.5-2.5 (3) MG/3ML SOLN Take 3 mLs by nebulization every 6 (six) hours as needed (wheezing).     lamoTRIgine (LAMICTAL) 100 MG tablet Take 100 mg by mouth 2 (two) times daily.     LANTUS SOLOSTAR 100 UNIT/ML Solostar Pen Inject 35 Units into the skin at  bedtime.     levETIRAcetam (KEPPRA) 500 MG tablet Take 500 mg by mouth 2 (two) times daily. Renal dose     linaclotide (LINZESS) 145 MCG CAPS capsule Take 145 mcg by mouth daily as needed.     LORazepam (ATIVAN) 2 MG/ML injection Inject 0.5 mLs (1 mg total) into the muscle every 15 (fifteen) minutes as needed. 4 mL 0   melatonin 5 MG TABS Take 5 mg by mouth at bedtime.     NON FORMULARY Diet - Regular     Phenylephrine-Cocoa Butter 0.25-85.5 % SUPP Place 1 suppository rectally 2 (two) times daily as needed.     rOPINIRole (REQUIP) 1 MG tablet Take 1 mg by mouth at bedtime.     rosuvastatin (CRESTOR) 20 MG tablet Take 20 mg by mouth every evening.     senna (SENOKOT) 8.6 MG TABS tablet Take 1 tablet by mouth at bedtime.     sodium bicarbonate 650 MG tablet Take 1 tablet (650 mg total) by mouth 2 (two) times daily.     tiZANidine (ZANAFLEX) 2 MG tablet Take 2  mg by mouth every 6 (six) hours as needed for muscle spasms (for back and sciatic pain).     zinc oxide 20 % ointment Apply 1 Application topically See admin instructions. Apply every shift to bilateral buttocks, sacrum, and coccyx     No current facility-administered medications on file prior to visit.    Allergies:   Allergies  Allergen Reactions   Ace Inhibitors Other (See Comments)    Hyperkalemia    Codeine     Unknown reaction   Sulfa Antibiotics     Unknown reaction      OBJECTIVE:  Physical Exam  Vitals:   08/12/23 1418  BP: 108/60  Pulse: 63  Weight: 188 lb (85.3 kg)  Height: 5\' 2"  (1.575 m)   Body mass index is 34.39 kg/m. No results found.  General: well developed, well nourished, very pleasant elderly female, seated, in no evident distress  Neurologic Exam Mental Status: Awake and fully alert. Oriented to place and time. Recent and remote memory intact. Attention span, concentration and fund of knowledge appropriate. Mood and affect appropriate.  Cranial Nerves: Pupils equal, briskly reactive to light.  Extraocular movements full without nystagmus. Visual fields full to confrontation. Hearing intact. Facial sensation intact. Face, tongue, palate moves normally and symmetrically.  Motor: Generalized weakness and muscle atrophy 4/5 BUE and 2/5 BLE Sensory.:  Decreased sensory BLE distally Coordination: Rapid alternating movements slowed. Finger-to-nose performed accurately bilaterally and heel-to-shin unable to perform. Gait and Station: Deferred as patient nonambulatory Reflexes: 1+ and symmetric. Toes downgoing.         ASSESSMENT/PLAN: JAMAIRA LYERLY is a 87 y.o. year old female with.    Seizure disorder:  Reports continued events about 1/week but improvement since adding lamotrigine as previously occurring daily if not multiple times per day. Do question nonepileptic etiology or behavioral of some of these events especially as previously occurring multiple times a day and can be triggered by certain situations Recommend further increase lamotrigine gradually to 150mg  BID - recommend increasing evening dose to 150 mg and continuing morning dose at 100mg  for 2 weeks then increasing to 250 mg twice daily Continue Keppra 500mg  BID - unable to increase higher due to CKD If episodes persist, can consider 72-hour EEG to further characterize events EEG 10/16/2021 no epileptiform discharges seen     Follow up in 6 months or call earlier if needed    CC:  PCP: Sharee Holster, NP    I spent 30 minutes of face-to-face and non-face-to-face time with patient and granddaughter.  This included previsit chart review, lab review, study review,  electronic health record documentation, patient and granddaughter education and discussion regarding above diagnoses and treatment plan and answered all the questions to patient and granddaughter satisfaction  Ihor Austin, [facility]  [facility] [address] [place], [place] [address]  Phone [phone] Fax  [phone] Note: This document was prepared with digital dictation and possible smart phrase technology. Any transcriptional errors that result from this process are unintentional.

## 2023-08-13 ENCOUNTER — Non-Acute Institutional Stay (SKILLED_NURSING_FACILITY): Payer: Self-pay | Admitting: Internal Medicine

## 2023-08-13 ENCOUNTER — Encounter: Payer: Self-pay | Admitting: Internal Medicine

## 2023-08-13 DIAGNOSIS — R1012 Left upper quadrant pain: Secondary | ICD-10-CM | POA: Diagnosis not present

## 2023-08-13 DIAGNOSIS — N184 Chronic kidney disease, stage 4 (severe): Secondary | ICD-10-CM | POA: Diagnosis not present

## 2023-08-13 DIAGNOSIS — R569 Unspecified convulsions: Secondary | ICD-10-CM

## 2023-08-13 DIAGNOSIS — S14124S Central cord syndrome at C4 level of cervical spinal cord, sequela: Secondary | ICD-10-CM

## 2023-08-13 DIAGNOSIS — I10 Essential (primary) hypertension: Secondary | ICD-10-CM

## 2023-08-13 DIAGNOSIS — G8252 Quadriplegia, C1-C4 incomplete: Secondary | ICD-10-CM

## 2023-08-13 DIAGNOSIS — E1122 Type 2 diabetes mellitus with diabetic chronic kidney disease: Secondary | ICD-10-CM

## 2023-08-13 DIAGNOSIS — Z794 Long term (current) use of insulin: Secondary | ICD-10-CM

## 2023-08-13 NOTE — Assessment & Plan Note (Signed)
Since 8/29 diabetic control is improved dramatically with A1c dropping from 10.6% down to 7.1% on 11/25.  She does exhibit intermittent hypoglycemia in the context of suboptimal meal intake here because of ingestion and snacks for family will bring him.  Lantus has been decreased from 45 units to 35 units and will be decreased further to 30 units.  She will continue the NovoLog before meals. CKD has improved slightly with current creatinine 1.54 and GFR 33 indicating low CKD stage IIIb.  GFR has ranged from 26-39 over the last 6 months.  Med list reviewed; no indication for change in meds or dosages.

## 2023-08-13 NOTE — Assessment & Plan Note (Signed)
Systolic blood pressure slightly elevated but serially it has been markedly variable.  Unless there is consistent sustained systolic hypertension; no change indicated.  Continue to monitor.

## 2023-08-13 NOTE — Assessment & Plan Note (Addendum)
GI saw the patient 07/26/2023 for the intermittent but chronic left-sided abdominal pain, constipation, and anorexia.  The patient's meal intake depends on the quantity of snacks she ingests provided by family.  She is actually gaining weight.  The family desires full colonoscopy despite her advanced age and multiple comorbidities.  GI is attempting to schedule this.  GI is requesting that Eliquis be held preop. They anticipate suboptimal prep and would defer to flexible sigmoidoscopy in that case. Based on her advanced age and advanced comorbidities ( especially dementia & essentially quadriplegic status); the risk of colonoscopy outweigh any benefits.  Because of her dementia, her review of systems is not valid.  I would recommend checking CBC; if it is stable to improved; consultation with the family about pursuing colonoscopy. I shall ask GI if ColoGuard completion is option in lieu of colonoscopy.

## 2023-08-13 NOTE — Assessment & Plan Note (Signed)
Neurology saw the patient 08/12/2023.  The atypical possible seizure activity manifesting as facial twitching has improved after additional omeprazole.  Subjectively she is having these events daily or multiple times a day; frequency has decreased to once a week.  Neurology wants to perform a 72-hour EEG if symptoms exacerbate in frequency or severity.  Otherwise follow-up will be in 6 months.

## 2023-08-13 NOTE — Progress Notes (Unsigned)
NURSING HOME LOCATION:  [facility] ROOM NUMBER:  [id number] W  CODE STATUS:  DNR  PCP:  Synthia Innocent NP  This is a nursing facility follow up visit of chronic medical diagnoses & to document compliance with Regulation 483.30 (c) in The Long Term Care Survey Manual Phase 2 which mandates caregiver visit ( visits can alternate among physician, PA or NP as per statutes) within 10 days of 30 days / 60 days/ 90 days post admission to SNF date   Also pre colonoscopy medication adjustment has been requested by GI, specifically holding Eliquis pre procedure. Interim medical record and care since last SNF visit was updated with review of diagnostic studies and change in clinical status since last visit were documented.  HPI:She is a permanent resident pf this facility with diagnoses of aortic atherosclerosis,diabetes with peripheral neuropathy,cervical cord syndrome,CKD Stage 3, GERD with dysphagia,essential HTN, hyperlipidemia,& hx of seizures.  Neurology evaluation was completed 12/12.  The atypical possible seizure activity manifested as facial twitching has improved after the addition of Lamictal.  She subjectively was having the events daily or multiple times a day; frequency is now approximately once a week.  It was recommended that a 72-hour EEG recording be completed if there is an exacerbation of the symptoms.  She is to follow-up in 6 months. GI saw the patient since 11/25 for the chronic GI symptoms of constipation, anorexia, and left-sided abdominal pain.  CT scan has revealed mild diverticulosis.  She has been gaining weight despite the chronic GI symptoms.  At the family's (2 daughters) insistence a colonoscopy is to be scheduled.  GI requested that the Eliquis be held preop.  In reference to possible anorexia ,she eats snacks brought in by her family and then will not eat the facility meal intermittently.  This has resulted in rare hypoglycemia. In fact over the last 2 weeks  fasting blood sugars have averaged approximately 92.  Her prior A1c in August 8/29 was 10.6%; on 11/25 A1c was 7.1% indicating excellent control. Her CKD has improved slightly with current creatinine 1.54 and GFR 33 indicating low CKD stage IIIb.  GFR has ranged from 26-39 over the last 6 months.  Review of systems: Dementia invalidated responses.  I asked what was her understanding of the recommendation by the subspecialists she saw.  Her response was "I do not understand, I cannot remember."  She states that she has some indigestion and burning in her throat intermittently but denies dysphagia.  Initially she stated she had no abdominal pain then subsequently she stated that occasionally she would have left lateral mid abdominal pain which she localized to between the hip and the umbilicus.  She could not quantitate or qualify the pain replying "cannot say."  She indicates that medication has helped with constipation.  She denies numbness and tingling but states that her fingers are "stiff."  Constitutional: No fever, significant weight change, fatigue  Eyes: No redness, discharge, pain, vision change ENT/mouth: No nasal congestion,  purulent discharge, earache, change in hearing, sore throat  Cardiovascular: No chest pain, palpitations, paroxysmal nocturnal dyspnea  Respiratory: No cough, sputum production, hemoptysis, significant snoring, apnea   Gastrointestinal: No nausea /vomiting. No rectal bleeding, melena described to her by staff. Genitourinary: No dysuria, hematuria, pyuria, incontinence (Foley in place) Dermatologic: No rash, pruritus, change in appearance of skin Neurologic: No dizziness, headache, syncope Psychiatric: No significant anxiety, depression, insomnia Endocrine: No change in hair/skin/nails, excessive thirst, excessive hunger, excessive urination  Hematologic/lymphatic: No  significant bruising, lymphadenopathy, abnormal bleeding Allergy/immunology: No itchy/watery eyes,  significant sneezing, urticaria, angioedema  Physical exam:  Pertinent or positive findings: She appears younger than her stated age of 32. Hair is disheveled and affect is flat.  She has bilateral ptosis.  She wearing only the upper plate.  Heart sounds are slightly distant.  Heart rate is slow.  Breath sounds exhibited bronchovesicular quality.  Abdomen is protuberant.  Foley catheter is in place.  She has trace edema at the sock line.  Pedal pulses are decreased, especially posterior tibial pulses.  She is weak in the upper extremities more so on the left.  There is no range of motion or strength to opposition in the lower extremities.  General appearance: Adequately nourished; no acute distress, increased work of breathing is present.   Lymphatic: No lymphadenopathy about the head, neck, axilla. Eyes: No conjunctival inflammation or lid edema is present. There is no scleral icterus. Ears:  External ear exam shows no significant lesions or deformities.   Nose:  External nasal examination shows no deformity or inflammation. Nasal mucosa are pink and moist without lesions, exudates Oral exam:  Lips and gums are healthy appearing. There is no oropharyngeal erythema or exudate. Neck:  No thyromegaly, masses, tenderness noted.    Heart:  No gallop, murmur, click, rub .  Lungs:  without wheezes, rhonchi, rales, rubs. Abdomen: Bowel sounds are normal. Abdomen is soft and nontender with no organomegaly, hernias, masses. GU: Deferred  Extremities:  No cyanosis, clubbing Neurologic exam :Balance, Rhomberg, finger to nose testing could not be completed due to clinical state Skin: Warm & dry w/o tenting. No significant lesions or rash.  See summary under each active problem in the Problem List with associated updated therapeutic plan

## 2023-08-13 NOTE — Patient Instructions (Signed)
See assessment and plan under each diagnosis in the problem list and acutely for this visit 

## 2023-08-14 NOTE — Assessment & Plan Note (Signed)
Minimal strength to opposition in UE, slightly asymmetric. No strength to opposition in LE.

## 2023-08-14 NOTE — Assessment & Plan Note (Signed)
Neurologic deficits stable.

## 2023-08-16 ENCOUNTER — Other Ambulatory Visit (HOSPITAL_COMMUNITY)
Admission: RE | Admit: 2023-08-16 | Discharge: 2023-08-16 | Disposition: A | Discharge status: Home / Self Care | Payer: Medicare HMO | Source: Skilled Nursing Facility | Attending: Internal Medicine | Admitting: Internal Medicine

## 2023-08-16 DIAGNOSIS — E1122 Type 2 diabetes mellitus with diabetic chronic kidney disease: Secondary | ICD-10-CM | POA: Insufficient documentation

## 2023-08-16 LAB — CBC
HCT: 27.1 % — ABNORMAL LOW (ref 36.0–46.0)
Hemoglobin: 8.9 g/dL — ABNORMAL LOW (ref 12.0–15.0)
MCH: 31.6 pg (ref 26.0–34.0)
MCHC: 32.8 g/dL (ref 30.0–36.0)
MCV: 96.1 fL (ref 80.0–100.0)
Platelets: 176 10*3/uL (ref 150–400)
RBC: 2.82 MIL/uL — ABNORMAL LOW (ref 3.87–5.11)
RDW: 13.7 % (ref 11.5–15.5)
WBC: 4.9 10*3/uL (ref 4.0–10.5)
nRBC: 0 % (ref 0.0–0.2)

## 2023-08-17 NOTE — Telephone Encounter (Signed)
Received staff message from patient's PCP at the [facility].  He stated that during his visit the patient had not much recollection of our conversation in regards to performing colonoscopy however her daughter was present at the time.  He anticipated suboptimal prep given her cognitive and physical state which I agree with.  He felt as though given her advanced age and advanced comorbidities given dementia and quadriplegia established that the risk of colonoscopy outweighed any benefits and that if her hemoglobin was stable then he would recommend not pursuing colonoscopy.  After most recent labs with a drop in hemoglobin and patient's ongoing complaints of abdominal pain he has agreed with [place] proceeding with colonoscopy or at least flexible sigmoidoscopy and holding Eliquis for 2 days.  The patient's granddaughter Ms. Jumper would be accompanying her to the procedure per the patient's daughter.  The patient's granddaughter or her daughter would need to sign the consent form given patient's cognitive decline.  We can proceed with colonoscopy as they report her H&H has dropped recently. (Per review of her chart her hemoglobin was checked yesterday morning and came back at 8.9 (down from 10 in July).  She was previously in the 7 range earlier this year in June.  Okay to go ahead and proceed with procedure as long as granddaughter is signing consent. I spoke with patients Daughter [name] when she was present during the office visit about risks of procedure.   Brooke Bonito, MSN, APRN, FNP-BC, AGACNP-BC [facility] at [facility]

## 2023-08-18 NOTE — Telephone Encounter (Signed)
LMOVM to return call.

## 2023-08-18 NOTE — Telephone Encounter (Signed)
Per granddaughter, schedule with nursing home and she will meet them there.

## 2023-08-18 NOTE — Telephone Encounter (Signed)
LM at [facility] to call back to schedule pt's procedure.

## 2023-08-19 ENCOUNTER — Other Ambulatory Visit (HOSPITAL_COMMUNITY)
Admission: RE | Admit: 2023-08-19 | Discharge: 2023-08-19 | Disposition: A | Discharge status: Home / Self Care | Payer: Medicare HMO | Source: Skilled Nursing Facility | Attending: Internal Medicine | Admitting: Internal Medicine

## 2023-08-19 DIAGNOSIS — U071 COVID-19: Secondary | ICD-10-CM | POA: Diagnosis not present

## 2023-08-19 DIAGNOSIS — B9729 Other coronavirus as the cause of diseases classified elsewhere: Secondary | ICD-10-CM | POA: Diagnosis not present

## 2023-08-19 DIAGNOSIS — R0989 Other specified symptoms and signs involving the circulatory and respiratory systems: Secondary | ICD-10-CM | POA: Diagnosis not present

## 2023-08-19 DIAGNOSIS — R059 Cough, unspecified: Secondary | ICD-10-CM | POA: Diagnosis not present

## 2023-08-19 LAB — CBC
HCT: 25.4 % — ABNORMAL LOW (ref 36.0–46.0)
Hemoglobin: 8.4 g/dL — ABNORMAL LOW (ref 12.0–15.0)
MCH: 31.1 pg (ref 26.0–34.0)
MCHC: 33.1 g/dL (ref 30.0–36.0)
MCV: 94.1 fL (ref 80.0–100.0)
Platelets: 172 10*3/uL (ref 150–400)
RBC: 2.7 MIL/uL — ABNORMAL LOW (ref 3.87–5.11)
RDW: 14 % (ref 11.5–15.5)
WBC: 6.2 10*3/uL (ref 4.0–10.5)
nRBC: 0 % (ref 0.0–0.2)

## 2023-08-19 LAB — D-DIMER, QUANTITATIVE: D-Dimer, Quant: 0.9 ug{FEU}/mL — ABNORMAL HIGH (ref 0.00–0.50)

## 2023-08-19 LAB — BASIC METABOLIC PANEL
Anion gap: 8 (ref 5–15)
BUN: 31 mg/dL — ABNORMAL HIGH (ref 8–23)
CO2: 22 mmol/L (ref 22–32)
Calcium: 9.2 mg/dL (ref 8.9–10.3)
Chloride: 100 mmol/L (ref 98–111)
Creatinine, Ser: 1.8 mg/dL — ABNORMAL HIGH (ref 0.44–1.00)
GFR, Estimated: 27 mL/min — ABNORMAL LOW (ref >60)
Glucose, Bld: 99 mg/dL (ref 70–99)
Potassium: 4.8 mmol/L (ref 3.5–5.1)
Sodium: 130 mmol/L — ABNORMAL LOW (ref 135–145)

## 2023-08-19 LAB — C-REACTIVE PROTEIN: CRP: 3.2 mg/dL — ABNORMAL HIGH (ref <1.0)

## 2023-08-20 ENCOUNTER — Encounter: Payer: Self-pay | Admitting: *Deleted

## 2023-08-20 ENCOUNTER — Other Ambulatory Visit: Payer: Self-pay | Admitting: *Deleted

## 2023-08-20 ENCOUNTER — Encounter: Payer: Self-pay | Admitting: Adult Health

## 2023-08-20 ENCOUNTER — Non-Acute Institutional Stay (SKILLED_NURSING_FACILITY): Payer: Self-pay | Admitting: Adult Health

## 2023-08-20 DIAGNOSIS — E119 Type 2 diabetes mellitus without complications: Secondary | ICD-10-CM | POA: Insufficient documentation

## 2023-08-20 DIAGNOSIS — U071 COVID-19: Secondary | ICD-10-CM | POA: Diagnosis not present

## 2023-08-20 MED ORDER — PEG 3350-KCL-NA BICARB-NACL 420 G PO SOLR: 4000.0000 mL | Freq: Once | ORAL | 0 refills | Status: AC

## 2023-08-20 NOTE — Telephone Encounter (Signed)
[employer] PA: Approved Authorization #[id number]  Tracking #[id number] Dates of service 09/20/2023 - 12/17/2023

## 2023-08-20 NOTE — Progress Notes (Signed)
Location:  [facility] Room Number: 154 Place of Service:  SNF (31)   CODE STATUS: dnr   Allergies  Allergen Reactions   Ace Inhibitors Other (See Comments)    Hyperkalemia    Codeine     Unknown reaction   Sulfa Antibiotics     Unknown reaction    Chief Complaint  Patient presents with   Acute Visit    COVID +    HPI:  She has had a cough without sputum production. She has tested positive for covid. There are no reports of fevers; no loss of smell present.   Past Medical History:  Diagnosis Date   Acute cystitis without hematuria    Adult failure to thrive    Anemia    Anxiety    Atherosclerosis of aorta (HCC)    Central cord syndrome at C4 level of cervical spinal cord, subsequent encounter (HCC)    Chronic kidney disease, stage IV (severe) (HCC)    CKD (chronic kidney disease)    stage 3   Depression    DM type 2 with diabetic peripheral neuropathy (HCC)    Dysphagia    GERD (gastroesophageal reflux disease)    Gout    High cholesterol    HTN (hypertension)    Hyponatremia    MDD (major depressive disorder)    Neck pain    Neuropathy    Quadriplegia, C1-C4 incomplete (HCC)    Radiculopathy    RLS (restless legs syndrome)    Unspecified convulsions (HCC)    Urinary retention     Past Surgical History:  Procedure Laterality Date   ABDOMINAL HYSTERECTOMY     ANTERIOR CERVICAL DECOMP/DISCECTOMY FUSION N/A 02/05/2021   Procedure: Cervical Three-Four  Anterior cervical decompression/discectomy/fusion;  Surgeon: Coletta Memos, MD;  Location: [facility] OR;  Service: Neurosurgery;  Laterality: N/A;  RM 20   APPENDECTOMY     BACK SURGERY     BALLOON DILATION N/A 11/19/2022   Procedure: BALLOON DILATION;  Surgeon: Lanelle Bal, DO;  Location: [facility];  Service: Endoscopy;  Laterality: N/A;   BIOPSY  09/22/2021   Procedure: BIOPSY;  Surgeon: Lanelle Bal, DO;  Location: [facility];  Service: Endoscopy;;   BIOPSY  11/19/2022    Procedure: BIOPSY;  Surgeon: Lanelle Bal, DO;  Location: [facility];  Service: Endoscopy;;   CERVICAL DISC SURGERY     ESOPHAGOGASTRODUODENOSCOPY (EGD) WITH PROPOFOL N/A 09/22/2021   Procedure: ESOPHAGOGASTRODUODENOSCOPY (EGD) WITH PROPOFOL;  Surgeon: Lanelle Bal, DO;  Location: [facility];  Service: Endoscopy;  Laterality: N/A;  1:00pm   ESOPHAGOGASTRODUODENOSCOPY (EGD) WITH PROPOFOL N/A 11/19/2022   Procedure: ESOPHAGOGASTRODUODENOSCOPY (EGD) WITH PROPOFOL;  Surgeon: Lanelle Bal, DO;  Location: [facility];  Service: Endoscopy;  Laterality: N/A;  11:30 am, asa 3   HAND SURGERY     KNEE SURGERY      Social History   Socioeconomic History   Marital status: Widowed    Spouse name: Not on file   Number of children: Not on file   Years of education: Not on file   Highest education level: Not on file  Occupational History   Not on file  Tobacco Use   Smoking status: Never   Smokeless tobacco: Never  Vaping Use   Vaping status: Never Used  Substance and Sexual Activity   Alcohol use: Never   Drug use: Never   Sexual activity: Not Currently  Other Topics Concern  Not on file  Social History Narrative   Lives at [facility]   Social Drivers of Health   Financial Resource Strain: Not on file  Food Insecurity: No Food Insecurity (02/20/2023)   Hunger Vital Sign    Worried About Running Out of Food in the Last Year: Never true    Ran Out of Food in the Last Year: Never true  Transportation Needs: No Transportation Needs (02/20/2023)   PRAPARE - Administrator, Civil Service (Medical): No    Lack of Transportation (Non-Medical): No  Physical Activity: Not on file  Stress: Not on file  Social Connections: Not on file  Intimate Partner Violence: Not At Risk (02/20/2023)   Humiliation, Afraid, Rape, and Kick questionnaire    Fear of Current or Ex-Partner: No    Emotionally Abused: No    Physically Abused: No    Sexually Abused: No    Family History  Problem Relation Age of Onset   Cancer Mother    Cardiomyopathy Father    Seizures Sister        childhood   Seizures Brother        in his 15s   Colon cancer Neg Hx       VITAL SIGNS BP 139/65   Pulse 85   Temp (!) 97 F (36.1 C)   Resp 20   Ht 5\' 2"  (1.575 m)   Wt 188 lb 14.4 oz (85.7 kg)   SpO2 94%   BMI 34.55 kg/m   Outpatient Encounter Medications as of 08/20/2023  Medication Sig   molnupiravir EUA (LAGEVRIO) 200 MG CAPS capsule Take 4 capsules by mouth 2 (two) times daily.   ondansetron (ZOFRAN) 4 MG/5ML solution Take 4 mg by mouth every 8 (eight) hours as needed for nausea or vomiting.   acetaminophen (TYLENOL) 325 MG tablet Take 650 mg by mouth 3 (three) times daily.   AMBULATORY NON FORMULARY MEDICATION Medication Name:  Continue monthly catheter changes with 48fr catheter at skilled nursing facility.   amLODipine (NORVASC) 10 MG tablet Take 10 mg by mouth daily.   apixaban (ELIQUIS) 5 MG TABS tablet Take 5 mg by mouth 2 (two) times daily.   augmented betamethasone dipropionate (DIPROLENE-AF) 0.05 % ointment Apply 1 Application topically 2 (two) times daily as needed. Apply to stomach and both breast   calcium-vitamin D (OSCAL WITH D) 500-5 MG-MCG tablet Take 1 tablet by mouth 2 (two) times daily.   camphor-menthol (SARNA) lotion Apply 1 Application topically 2 (two) times daily as needed for itching.   dapagliflozin propanediol (FARXIGA) 5 MG TABS tablet Take 5 mg by mouth daily.   DULoxetine (CYMBALTA) 20 MG capsule Take 20 mg by mouth daily.   ferrous sulfate 325 (65 FE) MG EC tablet Take 1 tablet (325 mg total) by mouth daily with breakfast.   fexofenadine (ALLEGRA) 60 MG tablet Take 60 mg by mouth daily.   gabapentin (NEURONTIN) 300 MG capsule Take 300 mg by mouth 3 (three) times daily.   insulin aspart (NOVOLOG FLEXPEN) 100 UNIT/ML FlexPen Inject 10 Units into the skin 3 (three) times daily with meals. 8 am, 12 pm, and 6 pm   Insulin Pen  Needle 30G X 5 MM MISC 1 Device by Does not apply route daily. 3/16"   ipratropium-albuterol (DUONEB) 0.5-2.5 (3) MG/3ML SOLN Take 3 mLs by nebulization every 6 (six) hours as needed (wheezing).   lamoTRIgine (LAMICTAL) 100 MG tablet Take 100 mg by mouth 2 (two) times daily.  lamoTRIgine 50 MG TBDP Take 50mg  PM in addition to 100mg  PM dose for 2 weeks then take twice daily in addition to 100mg  dosage   LANTUS SOLOSTAR 100 UNIT/ML Solostar Pen Inject 35 Units into the skin at bedtime.   levETIRAcetam (KEPPRA) 500 MG tablet Take 500 mg by mouth 2 (two) times daily. Renal dose   linaclotide (LINZESS) 145 MCG CAPS capsule Take 145 mcg by mouth daily as needed.   LORazepam (ATIVAN) 2 MG/ML injection Inject 0.5 mLs (1 mg total) into the muscle every 15 (fifteen) minutes as needed.   melatonin 5 MG TABS Take 5 mg by mouth at bedtime.   NON FORMULARY Diet - Regular   Phenylephrine-Cocoa Butter 0.25-85.5 % SUPP Place 1 suppository rectally 2 (two) times daily as needed.   polyethylene glycol-electrolytes (NULYTELY) 420 g solution Take 4,000 mLs by mouth once for 1 dose.   rOPINIRole (REQUIP) 1 MG tablet Take 1 mg by mouth at bedtime.   rosuvastatin (CRESTOR) 20 MG tablet Take 20 mg by mouth every evening.   senna (SENOKOT) 8.6 MG TABS tablet Take 1 tablet by mouth at bedtime.   sodium bicarbonate 650 MG tablet Take 1 tablet (650 mg total) by mouth 2 (two) times daily.   tiZANidine (ZANAFLEX) 2 MG tablet Take 2 mg by mouth every 6 (six) hours as needed for muscle spasms (for back and sciatic pain).   zinc oxide 20 % ointment Apply 1 Application topically See admin instructions. Apply every shift to bilateral buttocks, sacrum, and coccyx   No facility-administered encounter medications on file as of 08/20/2023.     SIGNIFICANT DIAGNOSTIC EXAMS   PREVIOUS   04-15-22; dexa: t score -3.391  NO NEW EXAMS    LABS REVIEWED PREVIOUS     08-20-22: hgb A1c 8.8 10-01-22: wbc 5.6; hgb 9.6; hct 29.9; mcv  94.9 plt 245; glucose 404; bun 43; creat 1.69; k+ 5.2; na++ 135; ca 8.2 gfr 30 protein 7.2 albumin 3.5 10-19-22: glucose 176; bun 45; creat 1.63; k+ 5.8; na++ 134; ca 8.9; gfr 31 10-31-22: wbc 4.6; hgb 8.4; hct 26.5; mcv 94.0 plt 183; glucose 93; bun 46; creat 1.64; k+ 5.2; na++ 134; ca 8.6; gfr 31; protein 6.5 albumin 3.2  11-05-22: glucose 117; bun 35; creat 1.25; k+ 4.3;na++ 137; ca 7.5; gfr 43; hgb A1c 8.5; vitamin D 26.12; chol 149; ldl 73 trig 166; hdl 43  01-25-23: wbc 4.5; hgb 8.8; hct 27.0; mcv 94.7 plt 178; glucose 163; bun 45; creat 1.58; k+ 5.8; na++ 134; ca 8.7; gfr 32; protein 6.6 albumin 3.4 iron 54; tibc 305; vitamin B12: 496; keppra 80.7 (normal 10-40) 01-26-23: glucose 116; bun 41; creat 1.54; k+ 5.5; na++ 133; ca 8.5 gfr 33   02-08-23: keppra 41.6 (10-40) 02-18-23: hgb A1c 7.5; tsh 5.113 02-19-23: wbc 6.0; hgb 7.9; hct 24.7; mcv 95.7 plt 202; glucose 225; bun 45; creat 1.76; k+ 6.4; na++ 130; ca 8.7; gfr 28; (repeat k+ 5.4) 02-20-23: wbc 6.2; hgb 7.5; hct 24.1; mcv 95.6 plt 186;glucose 195; bun 45; creat 1.76; k+ 5.6; na++ 134; ca 8.5 gfr 28; protein 6.2; albumin 3.1 free t4: 0.80; mag 1.8 02-21-23: vitamin B12 781; folate 26.1; iron 26; tibc 266; ferritin 139 03-23-23: wbc 6.3; hgb 10.0; hct 31.0; mcv 92.8 plt 186; glucose 239; bun 41; creat 1.35; k+ 4.6; na++ 135; ca 8.7; gfr 39; protein 7.6 albumin 3.6  04-05-23: glucose 215; bun 49; creat 1.88; k+ 4.3; na++ 134; ca 8.4; gfr 26 04-29-23: hgb A1c  10.2   06-24-23: chol 140; ldl 64; trig 190; hdl 38 06-28-23: urine culture: proteus mirabilis: cipro  07-26-23: glucose 39; bun 30; creat 1.54; k+ 4.7; na++ 140; ca 9.0; gfr 33 hgb A1c 7.1  TODAY  08-19-23: wbc 6.2; hgb 8.4; hct 25.4; mcv 94.1 plt 172; glucose 99; bun 31; creat 1.80; k+ 4.8; na++ 130; ca 9.2; gfr 27    Review of Systems  Constitutional:  Negative for malaise/fatigue.  Respiratory:  Positive for cough. Negative for shortness of breath.   Cardiovascular:  Negative for chest  pain, palpitations and leg swelling.  Gastrointestinal:  Positive for nausea and vomiting. Negative for abdominal pain, constipation, diarrhea and heartburn.  Musculoskeletal:  Negative for back pain, joint pain and myalgias.  Skin: Negative.   Neurological:  Negative for dizziness.  Psychiatric/Behavioral:  The patient is not nervous/anxious.    Physical Exam Constitutional:      General: She is not in acute distress.    Appearance: She is well-developed. She is not diaphoretic.  Neck:     Thyroid: No thyromegaly.  Cardiovascular:     Rate and Rhythm: Normal rate and regular rhythm.     Heart sounds: Normal heart sounds.  Pulmonary:     Effort: Pulmonary effort is normal. No respiratory distress.     Breath sounds: Normal breath sounds.  Abdominal:     General: Bowel sounds are normal. There is no distension.     Palpations: Abdomen is soft.     Tenderness: There is no abdominal tenderness.  Genitourinary:    Comments: foley Musculoskeletal:     Cervical back: Neck supple.     Right lower leg: No edema.     Left lower leg: No edema.     Comments: Limited range of motion in upper extremities Does not move lower extremities      Lymphadenopathy:     Cervical: No cervical adenopathy.  Skin:    General: Skin is warm and dry.  Neurological:     Mental Status: She is alert. Mental status is at baseline.     Comments: 03-22-23: SLUMS 14/30    Psychiatric:        Mood and Affect: Mood normal.     ASSESSMENT/ PLAN:  TODAY   Covid with comorbid diabetes mellitus: will complete molnupiravir and will monitor her status.    Synthia Innocent NP [facility]   call [phone]

## 2023-08-20 NOTE — Telephone Encounter (Signed)
Pt has been scheduled for 09/20/23. Instructions fax to [facility]. Prep sent to [facility].

## 2023-08-23 ENCOUNTER — Encounter: Payer: Self-pay | Admitting: *Deleted

## 2023-09-09 ENCOUNTER — Encounter: Payer: Self-pay | Admitting: Adult Health

## 2023-09-09 ENCOUNTER — Non-Acute Institutional Stay (SKILLED_NURSING_FACILITY): Payer: Self-pay | Admitting: Adult Health

## 2023-09-09 DIAGNOSIS — E1122 Type 2 diabetes mellitus with diabetic chronic kidney disease: Secondary | ICD-10-CM | POA: Diagnosis not present

## 2023-09-09 DIAGNOSIS — N184 Chronic kidney disease, stage 4 (severe): Secondary | ICD-10-CM | POA: Diagnosis not present

## 2023-09-09 DIAGNOSIS — S14124S Central cord syndrome at C4 level of cervical spinal cord, sequela: Secondary | ICD-10-CM

## 2023-09-09 DIAGNOSIS — I700 Atherosclerosis of aorta: Secondary | ICD-10-CM | POA: Diagnosis not present

## 2023-09-09 NOTE — Progress Notes (Signed)
 Location:  [facility] Room Number: [id number] Place of Service:  SNF (31)   CODE STATUS: dnr  Allergies  Allergen Reactions   Ace Inhibitors Other (See Comments)    Hyperkalemia    Codeine     Unknown reaction   Sulfa Antibiotics     Unknown reaction    Chief Complaint  Patient presents with   Acute Visit    Care plan meeting     HPI:  We have come together for her care plan meeting. BIMS 15/15 mood 3/30: decreased energy. She does get out of bed several times weekly without falls. She requires dependent assist with her adls. She is incontinent of bowel and has a foley. Dietary: setup for meals: appetite 76-100%; regular diet; weight is 185 pounds. Therapy: none at this time. She will continue to be followed for her chronic illnesses including:    Aortic atherosclerosis   CKD stage 4 due to type 2 diabetes mellitus Central cord syndrome at C4 level of cervical spine sequela  Past Medical History:  Diagnosis Date   Acute cystitis without hematuria    Adult failure to thrive    Anemia    Anxiety    Atherosclerosis of aorta (HCC)    Central cord syndrome at C4 level of cervical spinal cord, subsequent encounter (HCC)    Chronic kidney disease, stage IV (severe) (HCC)    CKD (chronic kidney disease)    stage 3   Depression    DM type 2 with diabetic peripheral neuropathy (HCC)    Dysphagia    GERD (gastroesophageal reflux disease)    Gout    High cholesterol    HTN (hypertension)    Hyponatremia    MDD (major depressive disorder)    Neck pain    Neuropathy    Quadriplegia, C1-C4 incomplete (HCC)    Radiculopathy    RLS (restless legs syndrome)    Unspecified convulsions (HCC)    Urinary retention     Past Surgical History:  Procedure Laterality Date   ABDOMINAL HYSTERECTOMY     ANTERIOR CERVICAL DECOMP/DISCECTOMY FUSION N/A 02/05/2021   Procedure: Cervical Three-Four  Anterior cervical decompression/discectomy/fusion;  Surgeon: Gillie Duncans,  MD;  Location: [facility] OR;  Service: Neurosurgery;  Laterality: N/A;  RM 20   APPENDECTOMY     BACK SURGERY     BALLOON DILATION N/A 11/19/2022   Procedure: BALLOON DILATION;  Surgeon: Cindie Carlin POUR, DO;  Location: [facility];  Service: Endoscopy;  Laterality: N/A;   BIOPSY  09/22/2021   Procedure: BIOPSY;  Surgeon: Cindie Carlin POUR, DO;  Location: [facility];  Service: Endoscopy;;   BIOPSY  11/19/2022   Procedure: BIOPSY;  Surgeon: Cindie Carlin POUR, DO;  Location: [facility];  Service: Endoscopy;;   CERVICAL DISC SURGERY     ESOPHAGOGASTRODUODENOSCOPY (EGD) WITH PROPOFOL  N/A 09/22/2021   Procedure: ESOPHAGOGASTRODUODENOSCOPY (EGD) WITH PROPOFOL ;  Surgeon: Cindie Carlin POUR, DO;  Location: [facility];  Service: Endoscopy;  Laterality: N/A;  1:00pm   ESOPHAGOGASTRODUODENOSCOPY (EGD) WITH PROPOFOL  N/A 11/19/2022   Procedure: ESOPHAGOGASTRODUODENOSCOPY (EGD) WITH PROPOFOL ;  Surgeon: Cindie Carlin POUR, DO;  Location: [facility];  Service: Endoscopy;  Laterality: N/A;  11:30 am, asa 3   HAND SURGERY     KNEE SURGERY      Social History   Socioeconomic History   Marital status: Widowed    Spouse name: Not on file   Number of children: Not on file  Years of education: Not on file   Highest education level: Not on file  Occupational History   Not on file  Tobacco Use   Smoking status: Never   Smokeless tobacco: Never  Vaping Use   Vaping status: Never Used  Substance and Sexual Activity   Alcohol use: Never   Drug use: Never   Sexual activity: Not Currently  Other Topics Concern   Not on file  Social History Narrative   Lives at [facility]   Social Drivers of Health   Financial Resource Strain: Not on file  Food Insecurity: No Food Insecurity (02/20/2023)   Hunger Vital Sign    Worried About Running Out of Food in the Last Year: Never true    Ran Out of Food in the Last Year: Never true  Transportation Needs: No Transportation Needs (02/20/2023)   PRAPARE  - Administrator, Civil Service (Medical): No    Lack of Transportation (Non-Medical): No  Physical Activity: Not on file  Stress: Not on file  Social Connections: Not on file  Intimate Partner Violence: Not At Risk (02/20/2023)   Humiliation, Afraid, Rape, and Kick questionnaire    Fear of Current or Ex-Partner: No    Emotionally Abused: No    Physically Abused: No    Sexually Abused: No   Family History  Problem Relation Age of Onset   Cancer Mother    Cardiomyopathy Father    Seizures Sister        childhood   Seizures Brother        in his 30s   Colon cancer Neg Hx       VITAL SIGNS BP (!) 144/74   Pulse 74   Temp 97.6 F (36.4 C)   Resp 20   Ht 5' 2 (1.575 m)   Wt 185 lb (83.9 kg)   SpO2 97%   BMI 33.84 kg/m   Outpatient Encounter Medications as of 09/09/2023  Medication Sig   acetaminophen  (TYLENOL ) 325 MG tablet Take 650 mg by mouth 3 (three) times daily.   AMBULATORY NON FORMULARY MEDICATION Medication Name:  Continue monthly catheter changes with 70fr catheter at skilled nursing facility.   amLODipine (NORVASC) 10 MG tablet Take 10 mg by mouth daily.   apixaban (ELIQUIS) 5 MG TABS tablet Take 5 mg by mouth 2 (two) times daily.   augmented betamethasone  dipropionate (DIPROLENE -AF) 0.05 % ointment Apply 1 Application topically 2 (two) times daily as needed. Apply to stomach and both breast   calcium -vitamin D  (OSCAL WITH D) 500-5 MG-MCG tablet Take 1 tablet by mouth 2 (two) times daily.   camphor-menthol (SARNA) lotion Apply 1 Application topically 2 (two) times daily as needed for itching.   dapagliflozin  propanediol (FARXIGA ) 5 MG TABS tablet Take 5 mg by mouth daily.   DULoxetine  (CYMBALTA ) 20 MG capsule Take 20 mg by mouth daily.   ferrous sulfate  325 (65 FE) MG EC tablet Take 1 tablet (325 mg total) by mouth daily with breakfast.   fexofenadine (ALLEGRA) 60 MG tablet Take 60 mg by mouth daily.   gabapentin  (NEURONTIN ) 300 MG capsule Take 300 mg  by mouth 3 (three) times daily.   insulin  aspart (NOVOLOG  FLEXPEN) 100 UNIT/ML FlexPen Inject 10 Units into the skin 3 (three) times daily with meals. 8 am, 12 pm, and 6 pm   Insulin  Pen Needle 30G X 5 MM MISC 1 Device by Does not apply route daily. 3/16   ipratropium-albuterol  (DUONEB) 0.5-2.5 (3) MG/3ML  SOLN Take 3 mLs by nebulization every 6 (six) hours as needed (wheezing).   lamoTRIgine  (LAMICTAL ) 100 MG tablet Take 100 mg by mouth 2 (two) times daily.   lamoTRIgine  50 MG TBDP Take 50mg  PM in addition to 100mg  PM dose for 2 weeks then take twice daily in addition to 100mg  dosage   LANTUS  SOLOSTAR 100 UNIT/ML Solostar Pen Inject 35 Units into the skin at bedtime.   levETIRAcetam  (KEPPRA ) 500 MG tablet Take 500 mg by mouth 2 (two) times daily. Renal dose   linaclotide  (LINZESS ) 145 MCG CAPS capsule Take 145 mcg by mouth daily as needed.   LORazepam  (ATIVAN ) 2 MG/ML injection Inject 0.5 mLs (1 mg total) into the muscle every 15 (fifteen) minutes as needed.   melatonin 5 MG TABS Take 5 mg by mouth at bedtime.   NON FORMULARY Diet - Regular   ondansetron  (ZOFRAN ) 4 MG/5ML solution Take 4 mg by mouth every 8 (eight) hours as needed for nausea or vomiting.   Phenylephrine -Cocoa Butter 0.25-85.5 % SUPP Place 1 suppository rectally 2 (two) times daily as needed.   rOPINIRole  (REQUIP ) 1 MG tablet Take 1 mg by mouth at bedtime.   rosuvastatin  (CRESTOR ) 20 MG tablet Take 20 mg by mouth every evening.   senna (SENOKOT) 8.6 MG TABS tablet Take 1 tablet by mouth at bedtime.   sodium bicarbonate  650 MG tablet Take 1 tablet (650 mg total) by mouth 2 (two) times daily.   tiZANidine  (ZANAFLEX ) 2 MG tablet Take 2 mg by mouth every 6 (six) hours as needed for muscle spasms (for back and sciatic pain).   zinc oxide 20 % ointment Apply 1 Application topically See admin instructions. Apply every shift to bilateral buttocks, sacrum, and coccyx   No facility-administered encounter medications on file as of  09/09/2023.     SIGNIFICANT DIAGNOSTIC EXAMS  PREVIOUS   04-15-22; dexa: t score -3.391  NO NEW EXAMS    LABS REVIEWED PREVIOUS     08-20-22: hgb A1c 8.8 10-01-22: wbc 5.6; hgb 9.6; hct 29.9; mcv 94.9 plt 245; glucose 404; bun 43; creat 1.69; k+ 5.2; na++ 135; ca 8.2 gfr 30 protein 7.2 albumin  3.5 10-19-22: glucose 176; bun 45; creat 1.63; k+ 5.8; na++ 134; ca 8.9; gfr 31 10-31-22: wbc 4.6; hgb 8.4; hct 26.5; mcv 94.0 plt 183; glucose 93; bun 46; creat 1.64; k+ 5.2; na++ 134; ca 8.6; gfr 31; protein 6.5 albumin  3.2  11-05-22: glucose 117; bun 35; creat 1.25; k+ 4.3;na++ 137; ca 7.5; gfr 43; hgb A1c 8.5; vitamin D  26.12; chol 149; ldl 73 trig 166; hdl 43  01-25-23: wbc 4.5; hgb 8.8; hct 27.0; mcv 94.7 plt 178; glucose 163; bun 45; creat 1.58; k+ 5.8; na++ 134; ca 8.7; gfr 32; protein 6.6 albumin  3.4 iron 54; tibc 305; vitamin B12: 496; keppra  80.7 (normal 10-40) 01-26-23: glucose 116; bun 41; creat 1.54; k+ 5.5; na++ 133; ca 8.5 gfr 33   02-08-23: keppra  41.6 (10-40) 02-18-23: hgb A1c 7.5; tsh 5.113 02-19-23: wbc 6.0; hgb 7.9; hct 24.7; mcv 95.7 plt 202; glucose 225; bun 45; creat 1.76; k+ 6.4; na++ 130; ca 8.7; gfr 28; (repeat k+ 5.4) 02-20-23: wbc 6.2; hgb 7.5; hct 24.1; mcv 95.6 plt 186;glucose 195; bun 45; creat 1.76; k+ 5.6; na++ 134; ca 8.5 gfr 28; protein 6.2; albumin  3.1 free t4: 0.80; mag 1.8 02-21-23: vitamin B12 781; folate 26.1; iron 26; tibc 266; ferritin 139 03-23-23: wbc 6.3; hgb 10.0; hct 31.0; mcv 92.8 plt 186; glucose 239;  bun 41; creat 1.35; k+ 4.6; na++ 135; ca 8.7; gfr 39; protein 7.6 albumin  3.6  04-05-23: glucose 215; bun 49; creat 1.88; k+ 4.3; na++ 134; ca 8.4; gfr 26 04-29-23: hgb A1c 10.2   06-24-23: chol 140; ldl 64; trig 190; hdl 38 06-28-23: urine culture: proteus mirabilis: cipro  07-26-23: glucose 39; bun 30; creat 1.54; k+ 4.7; na++ 140; ca 9.0; gfr 33 hgb A1c 7.1 08-19-23: wbc 6.2; hgb 8.4; hct 25.4; mcv 94.1 plt 172; glucose 99; bun 31; creat 1.80; k+ 4.8; na++ 130; ca  9.2; gfr 27    Review of Systems  Constitutional:  Negative for malaise/fatigue.  Respiratory:  Negative for cough and shortness of breath.   Cardiovascular:  Negative for chest pain, palpitations and leg swelling.  Gastrointestinal:  Negative for abdominal pain, constipation and heartburn.  Musculoskeletal:  Negative for back pain, joint pain and myalgias.  Skin: Negative.   Neurological:  Negative for dizziness.  Psychiatric/Behavioral:  The patient is not nervous/anxious.    Physical Exam Constitutional:      General: She is not in acute distress.    Appearance: She is well-developed. She is obese. She is not diaphoretic.  Neck:     Thyroid : No thyromegaly.  Cardiovascular:     Rate and Rhythm: Normal rate and regular rhythm.     Pulses: Normal pulses.     Heart sounds: Normal heart sounds.  Pulmonary:     Effort: Pulmonary effort is normal. No respiratory distress.     Breath sounds: Normal breath sounds.  Abdominal:     General: Bowel sounds are normal. There is no distension.     Palpations: Abdomen is soft.     Tenderness: There is no abdominal tenderness.  Genitourinary:    Comments: foley Musculoskeletal:     Cervical back: Neck supple.     Right lower leg: No edema.     Left lower leg: No edema.     Comments: Limited range of motion in upper extremities Does not move lower extremities       Lymphadenopathy:     Cervical: No cervical adenopathy.  Skin:    General: Skin is warm and dry.  Neurological:     Mental Status: She is alert. Mental status is at baseline.  Psychiatric:        Mood and Affect: Mood normal.      ASSESSMENT/ PLAN:  TODAY  Aortic atherosclerosis CKD stage 4 due to type 2 diabetes mellitus Central cord syndrome at C4 level of cervical spine sequela  Will continue current medications Will continue current plan of care Will continue to monitor her status.   Time spent with patient: 40 minutes: medications; plan of care dietary    Barnie Seip NP [facility]   call [phone]

## 2023-09-13 ENCOUNTER — Ambulatory Visit: Payer: Medicare HMO | Admitting: Urology

## 2023-09-16 ENCOUNTER — Encounter (HOSPITAL_COMMUNITY): Payer: Self-pay

## 2023-09-16 ENCOUNTER — Encounter (HOSPITAL_COMMUNITY)
Admission: RE | Admit: 2023-09-16 | Discharge: 2023-09-16 | Disposition: A | Discharge status: Home / Self Care | Payer: Medicare HMO | Source: Ambulatory Visit | Attending: Internal Medicine | Admitting: Internal Medicine

## 2023-09-16 DIAGNOSIS — R488 Other symbolic dysfunctions: Secondary | ICD-10-CM | POA: Diagnosis not present

## 2023-09-16 DIAGNOSIS — D638 Anemia in other chronic diseases classified elsewhere: Secondary | ICD-10-CM

## 2023-09-16 DIAGNOSIS — U071 COVID-19: Secondary | ICD-10-CM | POA: Diagnosis not present

## 2023-09-17 ENCOUNTER — Ambulatory Visit (HOSPITAL_COMMUNITY)
Admission: RE | Admit: 2023-09-17 | Discharge: 2023-09-17 | Disposition: A | Discharge status: Home / Self Care | Payer: Medicare HMO | Attending: Internal Medicine | Admitting: Internal Medicine

## 2023-09-17 NOTE — Pre-Procedure Instructions (Signed)
Pt is a resident of the [facility]. Talked with the nurse regarding medical history and pt instructions. A copy of the letter from the GI office and pt instructions sent to nurse by Forest Gleason, NT. Understanding was verbalized.

## 2023-09-20 ENCOUNTER — Ambulatory Visit (HOSPITAL_COMMUNITY)
Admission: RE | Admit: 2023-09-20 | Discharge: 2023-09-20 | Disposition: A | Discharge status: Skilled Nursing Facility | Payer: Medicare HMO | Source: Skilled Nursing Facility | Attending: Internal Medicine | Admitting: Internal Medicine

## 2023-09-20 ENCOUNTER — Ambulatory Visit (HOSPITAL_COMMUNITY): Payer: Medicare HMO | Admitting: Anesthesiology

## 2023-09-20 ENCOUNTER — Other Ambulatory Visit: Payer: Self-pay

## 2023-09-20 ENCOUNTER — Encounter (HOSPITAL_COMMUNITY)
Admission: RE | Disposition: A | Discharge status: Skilled Nursing Facility | Payer: Self-pay | Source: Skilled Nursing Facility | Attending: Internal Medicine

## 2023-09-20 ENCOUNTER — Encounter (HOSPITAL_COMMUNITY): Payer: Self-pay | Admitting: Internal Medicine

## 2023-09-20 DIAGNOSIS — E1122 Type 2 diabetes mellitus with diabetic chronic kidney disease: Secondary | ICD-10-CM | POA: Diagnosis not present

## 2023-09-20 DIAGNOSIS — E039 Hypothyroidism, unspecified: Secondary | ICD-10-CM

## 2023-09-20 DIAGNOSIS — K5900 Constipation, unspecified: Secondary | ICD-10-CM

## 2023-09-20 DIAGNOSIS — N184 Chronic kidney disease, stage 4 (severe): Secondary | ICD-10-CM | POA: Insufficient documentation

## 2023-09-20 DIAGNOSIS — Z8719 Personal history of other diseases of the digestive system: Secondary | ICD-10-CM | POA: Diagnosis not present

## 2023-09-20 DIAGNOSIS — R1032 Left lower quadrant pain: Secondary | ICD-10-CM | POA: Diagnosis not present

## 2023-09-20 DIAGNOSIS — F418 Other specified anxiety disorders: Secondary | ICD-10-CM | POA: Insufficient documentation

## 2023-09-20 DIAGNOSIS — D638 Anemia in other chronic diseases classified elsewhere: Secondary | ICD-10-CM

## 2023-09-20 DIAGNOSIS — D631 Anemia in chronic kidney disease: Secondary | ICD-10-CM | POA: Insufficient documentation

## 2023-09-20 DIAGNOSIS — G709 Myoneural disorder, unspecified: Secondary | ICD-10-CM | POA: Insufficient documentation

## 2023-09-20 DIAGNOSIS — I129 Hypertensive chronic kidney disease with stage 1 through stage 4 chronic kidney disease, or unspecified chronic kidney disease: Secondary | ICD-10-CM | POA: Insufficient documentation

## 2023-09-20 DIAGNOSIS — Z7985 Long-term (current) use of injectable non-insulin antidiabetic drugs: Secondary | ICD-10-CM | POA: Diagnosis not present

## 2023-09-20 DIAGNOSIS — K5732 Diverticulitis of large intestine without perforation or abscess without bleeding: Secondary | ICD-10-CM | POA: Insufficient documentation

## 2023-09-20 DIAGNOSIS — Z794 Long term (current) use of insulin: Secondary | ICD-10-CM | POA: Diagnosis not present

## 2023-09-20 DIAGNOSIS — E119 Type 2 diabetes mellitus without complications: Secondary | ICD-10-CM | POA: Diagnosis not present

## 2023-09-20 HISTORY — PX: FLEXIBLE SIGMOIDOSCOPY: SHX5431

## 2023-09-20 LAB — CBC WITH DIFFERENTIAL/PLATELET
Abs Immature Granulocytes: 0.01 10*3/uL (ref 0.00–0.07)
Basophils Absolute: 0 10*3/uL (ref 0.0–0.1)
Basophils Relative: 0 %
Eosinophils Absolute: 0.2 10*3/uL (ref 0.0–0.5)
Eosinophils Relative: 4 %
HCT: 27.6 % — ABNORMAL LOW (ref 36.0–46.0)
Hemoglobin: 9 g/dL — ABNORMAL LOW (ref 12.0–15.0)
Immature Granulocytes: 0 %
Lymphocytes Relative: 39 %
Lymphs Abs: 2.1 10*3/uL (ref 0.7–4.0)
MCH: 30.7 pg (ref 26.0–34.0)
MCHC: 32.6 g/dL (ref 30.0–36.0)
MCV: 94.2 fL (ref 80.0–100.0)
Monocytes Absolute: 0.3 10*3/uL (ref 0.1–1.0)
Monocytes Relative: 6 %
Neutro Abs: 2.7 10*3/uL (ref 1.7–7.7)
Neutrophils Relative %: 51 %
Platelets: 218 10*3/uL (ref 150–400)
RBC: 2.93 MIL/uL — ABNORMAL LOW (ref 3.87–5.11)
RDW: 13.5 % (ref 11.5–15.5)
WBC: 5.4 10*3/uL (ref 4.0–10.5)
nRBC: 0 % (ref 0.0–0.2)

## 2023-09-20 LAB — GLUCOSE, CAPILLARY: Glucose-Capillary: 130 mg/dL — ABNORMAL HIGH (ref 70–99)

## 2023-09-20 SURGERY — SIGMOIDOSCOPY, FLEXIBLE
Anesthesia: General

## 2023-09-20 MED ORDER — LACTATED RINGERS IV SOLN: INTRAVENOUS | Status: DC | PRN

## 2023-09-20 MED ORDER — PROPOFOL 10 MG/ML IV BOLUS
INTRAVENOUS | Status: DC | PRN
  Administered 2023-09-20: 40 mg via INTRAVENOUS

## 2023-09-20 MED ORDER — LIDOCAINE HCL (PF) 2 % IJ SOLN
INTRAMUSCULAR | Status: DC | PRN
  Administered 2023-09-20: 100 mg via INTRADERMAL

## 2023-09-20 MED ORDER — GLYCOPYRROLATE PF 0.2 MG/ML IJ SOSY
PREFILLED_SYRINGE | INTRAMUSCULAR | Status: DC | PRN
  Administered 2023-09-20: .4 mg via INTRAVENOUS

## 2023-09-20 MED ORDER — LIDOCAINE HCL (PF) 2 % IJ SOLN
INTRAMUSCULAR | Status: AC
  Filled 2023-09-20: qty 5

## 2023-09-20 NOTE — Op Note (Signed)
[facility] Patient Name: [name] Procedure Date: 09/20/2023 7:55 AM MRN: [id number] Date of Birth: 1936-08-07 Attending MD: Hennie Duos. Marletta Lor , [place], [id number] CSN: [id number] Age: 88 Admit Type: Outpatient Procedure:                Colonoscopy Indications:              Abdominal pain in the left lower quadrant,                            Follow-up of diverticulitis Providers:                Hennie Duos. Marletta Lor, DO, Buel Ream. Thomasena Edis RN, RN,                            Dyann Ruddle Referring MD:              Medicines:                See the Anesthesia note for documentation of the                            administered medications Complications:            No immediate complications. Estimated Blood Loss:     Estimated blood loss: none. Procedure:                Pre-Anesthesia Assessment:                           - The anesthesia plan was to use monitored                            anesthesia care (MAC).                           After obtaining informed consent, the colonoscope                            was passed under direct vision. Throughout the                            procedure, the patient's blood pressure, pulse, and                            oxygen saturations were monitored continuously. The                            PCF-HQ190L ([id number]) scope was introduced through                            the anus with the intention of advancing to the                            cecum. The scope was advanced to the sigmoid colon                            before the procedure was  aborted. Medications were                            given. The colonoscopy was performed without                            difficulty. The patient tolerated the procedure                            well. The quality of the bowel preparation was                            evaluated using the BBPS [facility] Bowel Preparation                            Scale) with scores of: Left Colon = 0  (unprepared,                            mucosa not seen due to solid stool that cannot be                            cleared or unseen proximal colon segment in a                            colonoscopy aborted due to inadequate bowel prep).                            The total BBPS score equals 0. The quality of the                            bowel preparation was inadequate. Scope In: 8:19:31 AM Scope Out: 8:22:04 AM Total Procedure Duration: 0 hours 2 minutes 33 seconds  Findings:      Extensive amounts of solid stool was found in the rectum and in the       sigmoid colon, precluding visualization. Impression:               - Preparation of the colon was inadequate.                           - Stool in the rectum and in the sigmoid colon.                           - No specimens collected. Moderate Sedation:      Per Anesthesia Care Recommendation:           - Patient has a contact number available for                            emergencies. The signs and symptoms of potential                            delayed complications were discussed with the  patient. Return to normal activities tomorrow.                            Written discharge instructions were provided to the                            patient.                           - Resume previous diet.                           - Continue present medications.                           - Return to GI clinic in 6 weeks. Aggressive bowel                            regimen                           - No evidence of stercoral colitis or                            diverticulitis on today's exam, though limited                            examination due to large amount of stool. Procedure Code(s):        --- Professional ---                           [phone], 53, Colonoscopy, flexible; diagnostic,                            including collection of specimen(s) by brushing or                            washing,  when performed (separate procedure) Diagnosis Code(s):        --- Professional ---                           R10.32, Left lower quadrant pain                           K57.32, Diverticulitis of large intestine without                            perforation or abscess without bleeding CPT copyright 2022 American Medical Association. All rights reserved. The codes documented in this report are preliminary and upon coder review may  be revised to meet current compliance requirements. Hennie Duos. Marletta Lor, DO Hennie Duos. Marletta Lor, DO 09/20/2023 8:26:25 AM This report has been signed electronically. Number of Addenda: 0

## 2023-09-20 NOTE — Anesthesia Postprocedure Evaluation (Signed)
Anesthesia Post Note  Patient: [name]  Procedure(s) Performed: FLEXIBLE SIGMOIDOSCOPY  Patient location during evaluation: Phase II Anesthesia Type: General Level of consciousness: awake Pain management: pain level controlled Vital Signs Assessment: post-procedure vital signs reviewed and stable Respiratory status: spontaneous breathing and respiratory function stable Cardiovascular status: blood pressure returned to baseline and stable Postop Assessment: no headache and no apparent nausea or vomiting Anesthetic complications: no Comments: Late entry   No notable events documented.   Last Vitals:  Vitals:   09/20/23 0757 09/20/23 0827  BP: (!) 157/50 (!) 111/48  Pulse: 64   Resp: (!) 23 15  Temp: 36.7 C 37.5 C  SpO2: 100% 100%    Last Pain:  Vitals:   09/20/23 0827  TempSrc: Axillary  PainSc:                  Windell [place]

## 2023-09-20 NOTE — Transfer of Care (Signed)
Immediate Anesthesia Transfer of Care Note  Patient: [name]  Procedure(s) Performed: FLEXIBLE SIGMOIDOSCOPY  Patient Location: Endoscopy Unit  Anesthesia Type:General  Level of Consciousness: awake, alert , and oriented  Airway & Oxygen Therapy: Patient Spontanous Breathing and Patient connected to face mask oxygen  Post-op Assessment: Report given to RN and Post -op Vital signs reviewed and stable  Post vital signs: Reviewed and stable  Last Vitals:  Vitals Value Taken Time  BP    Temp    Pulse    Resp    SpO2      Last Pain:  Vitals:   09/20/23 0816  TempSrc:   PainSc: 0-No pain         Complications: No notable events documented.

## 2023-09-20 NOTE — Anesthesia Preprocedure Evaluation (Signed)
Anesthesia Evaluation  Patient identified by MRN, date of birth, ID band Patient awake    Reviewed: Allergy & Precautions, H&P , NPO status , Patient's Chart, lab work & pertinent test results, reviewed documented beta blocker date and time   Airway Mallampati: II  TM Distance: >3 FB Neck ROM: full    Dental no notable dental hx.    Pulmonary neg pulmonary ROS   Pulmonary exam normal breath sounds clear to auscultation       Cardiovascular Exercise Tolerance: Good hypertension, negative cardio ROS  Rhythm:regular Rate:Normal     Neuro/Psych Seizures -,  PSYCHIATRIC DISORDERS Anxiety Depression     Neuromuscular disease negative neurological ROS  negative psych ROS   GI/Hepatic negative GI ROS, Neg liver ROS,GERD  ,,  Endo/Other  diabetesHypothyroidism  Class 3 obesity  Renal/GU Renal diseasenegative Renal ROS  negative genitourinary   Musculoskeletal   Abdominal   Peds  Hematology negative hematology ROS (+) Blood dyscrasia, anemia   Anesthesia Other Findings   Reproductive/Obstetrics negative OB ROS                             Anesthesia Physical Anesthesia Plan  ASA: 3  Anesthesia Plan: General   Post-op Pain Management:    Induction:   PONV Risk Score and Plan: Propofol infusion  Airway Management Planned:   Additional Equipment:   Intra-op Plan:   Post-operative Plan:   Informed Consent: I have reviewed the patients History and Physical, chart, labs and discussed the procedure including the risks, benefits and alternatives for the proposed anesthesia with the patient or authorized representative who has indicated his/her understanding and acceptance.     Dental Advisory Given  Plan Discussed with: CRNA  Anesthesia Plan Comments:        Anesthesia Quick Evaluation

## 2023-09-20 NOTE — Discharge Instructions (Addendum)
  Colonoscopy Discharge Instructions  Read the instructions outlined below and refer to this sheet in the next few weeks. These discharge instructions provide you with general information on caring for yourself after you leave the hospital. Your doctor may also give you specific instructions. While your treatment has been planned according to the most current medical practices available, unavoidable complications occasionally occur.   ACTIVITY You may resume your regular activity, but move at a slower pace for the next 24 hours.  Take frequent rest periods for the next 24 hours.  Walking will help get rid of the air and reduce the bloated feeling in your belly (abdomen).  No driving for 24 hours (because of the medicine (anesthesia) used during the test).   Do not sign any important legal documents or operate any machinery for 24 hours (because of the anesthesia used during the test).  NUTRITION Drink plenty of fluids.  You may resume your normal diet as instructed by your doctor.  Begin with a light meal and progress to your normal diet. Heavy or fried foods are harder to digest and may make you feel sick to your stomach (nauseated).  Avoid alcoholic beverages for 24 hours or as instructed.  MEDICATIONS You may resume your normal medications unless your doctor tells you otherwise.  WHAT YOU CAN EXPECT TODAY Some feelings of bloating in the abdomen.  Passage of more gas than usual.  Spotting of blood in your stool or on the toilet paper.  IF YOU HAD POLYPS REMOVED DURING THE COLONOSCOPY: No aspirin products for 7 days or as instructed.  No alcohol for 7 days or as instructed.  Eat a soft diet for the next 24 hours.  FINDING OUT THE RESULTS OF YOUR TEST Not all test results are available during your visit. If your test results are not back during the visit, make an appointment with your caregiver to find out the results. Do not assume everything is normal if you have not heard from your  caregiver or the medical facility. It is important for you to follow up on all of your test results.  SEEK IMMEDIATE MEDICAL ATTENTION IF: You have more than a spotting of blood in your stool.  Your belly is swollen (abdominal distention).  You are nauseated or vomiting.  You have a temperature over 101.  You have abdominal pain or discomfort that is severe or gets worse throughout the day.   Your colonoscopy revealed an extensive amount of stool in your rectum and sigmoid colon.  I did not see any evidence of diverticulitis or stercoral colitis though overall limited examination today.  Continue aggressive bowel regimen.  Follow-up in GI office in 6 weeks.  I hope you have a great rest of your week!  Hennie Duos. Marletta Lor, D.O. Gastroenterology and Hepatology [facility]

## 2023-09-20 NOTE — H&P (Signed)
Primary Care Physician:  Sharee Holster, NP Primary Gastroenterologist:  Dr. Marletta Lor  Pre-Procedure History & Physical: HPI:  [name] is a 88 y.o. female is here for a colonoscopy to be performed for Constipation, left lower quadrant abdominal pain, history of diverticulitis.  Past Medical History:  Diagnosis Date   Acute cystitis without hematuria    Adult failure to thrive    Anemia    Anxiety    Atherosclerosis of aorta (HCC)    Central cord syndrome at C4 level of cervical spinal cord, subsequent encounter (HCC)    Chronic kidney disease, stage IV (severe) (HCC)    CKD (chronic kidney disease)    stage 3   Depression    DM type 2 with diabetic peripheral neuropathy (HCC)    Dysphagia    GERD (gastroesophageal reflux disease)    Gout    High cholesterol    HTN (hypertension)    Hyponatremia    MDD (major depressive disorder)    Neck pain    Neuropathy    Quadriplegia, C1-C4 incomplete (HCC)    Radiculopathy    RLS (restless legs syndrome)    Unspecified convulsions (HCC)    Urinary retention     Past Surgical History:  Procedure Laterality Date   ABDOMINAL HYSTERECTOMY     ANTERIOR CERVICAL DECOMP/DISCECTOMY FUSION N/A 02/05/2021   Procedure: Cervical Three-Four  Anterior cervical decompression/discectomy/fusion;  Surgeon: Coletta Memos, MD;  Location: [facility];  Service: Neurosurgery;  Laterality: N/A;  RM 20   APPENDECTOMY     BACK SURGERY     BALLOON DILATION N/A 11/19/2022   Procedure: BALLOON DILATION;  Surgeon: Lanelle Bal, DO;  Location: [facility];  Service: Endoscopy;  Laterality: N/A;   BIOPSY  09/22/2021   Procedure: BIOPSY;  Surgeon: Lanelle Bal, DO;  [facility];  Service: Endoscopy;;   BIOPSY  11/19/2022   Procedure: BIOPSY;  Surgeon: Lanelle Bal, DO;  [facility]: [facility];  Service: Endoscopy;;   CERVICAL DISC SURGERY     ESOPHAGOGASTRODUODENOSCOPY (EGD) WITH PROPOFOL N/A 09/22/2021   Procedure:  ESOPHAGOGASTRODUODENOSCOPY (EGD) WITH PROPOFOL;  Surgeon: Lanelle Bal, DO;  [facility];  Service: Endoscopy;  Laterality: N/A;  1:00pm   ESOPHAGOGASTRODUODENOSCOPY (EGD) WITH PROPOFOL N/A 11/19/2022   Procedure: ESOPHAGOGASTRODUODENOSCOPY (EGD) WITH PROPOFOL;  Surgeon: Lanelle Bal, DO;  [facility];  Service: Endoscopy;  Laterality: N/A;  11:30 am, asa 3   HAND SURGERY     KNEE SURGERY      Prior to Admission medications   Medication Sig Start Date End Date Taking? Authorizing Provider  acetaminophen (TYLENOL) 325 MG tablet Take 650 mg by mouth 3 (three) times daily.    [provider]  AMBULATORY NON FORMULARY MEDICATION Medication Name:  Continue monthly catheter changes with 81fr catheter at skilled nursing facility. 08/12/21   McKenzie, Mardene Celeste, MD  amLODipine (NORVASC) 10 MG tablet Take 10 mg by mouth daily.    [provider]  apixaban (ELIQUIS) 5 MG TABS tablet Take 5 mg by mouth 2 (two) times daily.    [provider]  augmented betamethasone dipropionate (DIPROLENE-AF) 0.05 % ointment Apply 1 Application topically 2 (two) times daily as needed. Apply to stomach and both breast    [provider]  calcium-vitamin D (OSCAL WITH D) 500-5 MG-MCG tablet Take 1 tablet by mouth 2 (two) times daily.    [provider]  camphor-menthol Wynelle Fanny) lotion Apply 1  Application topically 2 (two) times daily as needed for itching.    [provider]  dapagliflozin propanediol (FARXIGA) 5 MG TABS tablet Take 5 mg by mouth daily.    [provider]  DULoxetine (CYMBALTA) 20 MG capsule Take 20 mg by mouth daily.    [provider]  ferrous sulfate 325 (65 FE) MG EC tablet Take 1 tablet (325 mg total) by mouth daily with breakfast. 02/24/23 02/24/24  Burnadette Pop, MD  fexofenadine (ALLEGRA) 60 MG tablet Take 60 mg by mouth daily.    [provider]  gabapentin (NEURONTIN) 300 MG capsule  Take 300 mg by mouth 3 (three) times daily.    [provider]  insulin aspart (NOVOLOG FLEXPEN) 100 UNIT/ML FlexPen Inject 10 Units into the skin 3 (three) times daily with meals. 8 am, 12 pm, and 6 pm    [provider]  Insulin Pen Needle 30G X 5 MM MISC 1 Device by Does not apply route daily. 3/16"    Sharee Holster, NP  ipratropium-albuterol (DUONEB) 0.5-2.5 (3) MG/3ML SOLN Take 3 mLs by nebulization every 6 (six) hours as needed (wheezing).    [provider]  lamoTRIgine (LAMICTAL) 100 MG tablet Take 100 mg by mouth 2 (two) times daily.    [provider]  lamoTRIgine 50 MG TBDP Take 50mg  PM in addition to 100mg  PM dose for 2 weeks then take twice daily in addition to 100mg  dosage 08/12/23   Ihor Austin, NP  LANTUS SOLOSTAR 100 UNIT/ML Solostar Pen Inject 35 Units into the skin at bedtime. 09/04/21   [provider]  levETIRAcetam (KEPPRA) 500 MG tablet Take 500 mg by mouth 2 (two) times daily. Renal dose    [provider]  linaclotide (LINZESS) 145 MCG CAPS capsule Take 145 mcg by mouth daily as needed.    [provider]  LORazepam (ATIVAN) 2 MG/ML injection Inject 0.5 mLs (1 mg total) into the muscle every 15 (fifteen) minutes as needed. 12/07/22   Sharee Holster, NP  melatonin 5 MG TABS Take 5 mg by mouth at bedtime.    [provider]  NON FORMULARY Diet - Regular    [provider]  ondansetron (ZOFRAN) 4 MG/5ML solution Take 4 mg by mouth every 8 (eight) hours as needed for nausea or vomiting.    [provider]  Phenylephrine-Cocoa Butter 0.25-85.5 % SUPP Place 1 suppository rectally 2 (two) times daily as needed.    [provider]  rOPINIRole (REQUIP) 1 MG tablet Take 1 mg by mouth at bedtime.    [provider]  rosuvastatin (CRESTOR) 20 MG tablet Take 20 mg by mouth every evening.    [provider]  senna (SENOKOT) 8.6 MG TABS tablet Take 1 tablet by mouth at  bedtime.    [provider]  sodium bicarbonate 650 MG tablet Take 1 tablet (650 mg total) by mouth 2 (two) times daily. 02/24/23   Burnadette Pop, MD  tiZANidine (ZANAFLEX) 2 MG tablet Take 2 mg by mouth every 6 (six) hours as needed for muscle spasms (for back and sciatic pain).    [provider]  zinc oxide 20 % ointment Apply 1 Application topically See admin instructions. Apply every shift to bilateral buttocks, sacrum, and coccyx    [provider]    Allergies as of 09/20/2023 - Review Complete 09/20/2023  Allergen Reaction Noted   Ace inhibitors Other (See Comments) 08/29/2021   Codeine  03/09/2018  Sulfa antibiotics  03/25/2021    Family History  Problem Relation Age of Onset   Cancer Mother    Cardiomyopathy Father    Seizures Sister        childhood   Seizures Brother        in his 43s   Colon cancer Neg Hx     Social History   Socioeconomic History   Marital status: Widowed    Spouse name: Not on file   Number of children: Not on file   Years of education: Not on file   Highest education level: Not on file  Occupational History   Not on file  Tobacco Use   Smoking status: Never   Smokeless tobacco: Never  Vaping Use   Vaping status: Never Used  Substance and Sexual Activity   Alcohol use: Never   Drug use: Never   Sexual activity: Not Currently  Other Topics Concern   Not on file  Social History Narrative   L[facility] at [facility]   Social Drivers of Health   Financial Resource Strain: Not on file  Food Insecurity: No Food Insecurity (02/20/2023)   Hunger Vital Sign    Worried About Running Out of Food in the Last Year: Never true    Ran Out of Food in the Last Year: Never true  Transportation Needs: No Transportation Needs (02/20/2023)   PRAPARE - Administrator, Civil Service (Medical): No    Lack of Transportation (Non-Medical): No  Physical Activity: Not on file  Stress: Not on file  Social  Connections: Not on file  Intimate Partner Violence: Not At Risk (02/20/2023)   Humiliation, Afraid, Rape, and Kick questionnaire    Fear of Current or Ex-Partner: No    Emotionally Abused: No    Physically Abused: No    Sexually Abused: No    Review of Systems: See HPI, otherwise negative ROS  Physical Exam: Vital signs in last 24 hours: Temp:  [98 F (36.7 C)] 98 F (36.7 C) (01/20 0757) Pulse Rate:  [64] 64 (01/20 0757) Resp:  [23] 23 (01/20 0757) BP: (157)/(50) 157/50 (01/20 0757) SpO2:  [100 %] 100 % (01/20 0757) Weight:  [83.9 kg] 83.9 kg (01/20 0757)   General:   Alert,  Well-developed, well-nourished, pleasant and cooperative in NAD Head:  Normocephalic and atraumatic. Eyes:  Sclera clear, no icterus.   Conjunctiva pink. Ears:  Normal auditory acuity. Nose:  No deformity, discharge,  or lesions. Msk:  Symmetrical without gross deformities. Normal posture. Extremities:  Without clubbing or edema. Neurologic:  Alert and  oriented x4;  grossly normal neurologically. Skin:  Intact without significant lesions or rashes. Psych:  Alert and cooperative. Normal mood and affect.  Impression/Plan: EUNIQUA KALISTA is here for a colonoscopy to be performed for Constipation, left lower quadrant abdominal pain, history of diverticulitis.  The risks of the procedure including infection, bleed, or perforation as well as benefits, limitations, alternatives and imponderables have been reviewed with the patient. Questions have been answered. All parties agreeable.

## 2023-09-21 ENCOUNTER — Other Ambulatory Visit (HOSPITAL_COMMUNITY)
Admission: RE | Admit: 2023-09-21 | Discharge: 2023-09-21 | Disposition: A | Discharge status: Home / Self Care | Payer: Medicare HMO | Source: Skilled Nursing Facility | Attending: Adult Health | Admitting: Adult Health

## 2023-09-21 ENCOUNTER — Encounter (HOSPITAL_COMMUNITY): Payer: Self-pay | Admitting: Internal Medicine

## 2023-09-21 DIAGNOSIS — N184 Chronic kidney disease, stage 4 (severe): Secondary | ICD-10-CM | POA: Diagnosis not present

## 2023-09-21 LAB — BASIC METABOLIC PANEL
Anion gap: 11 (ref 5–15)
BUN: 23 mg/dL (ref 8–23)
CO2: 21 mmol/L — ABNORMAL LOW (ref 22–32)
Calcium: 9 mg/dL (ref 8.9–10.3)
Chloride: 98 mmol/L (ref 98–111)
Creatinine, Ser: 1.41 mg/dL — ABNORMAL HIGH (ref 0.44–1.00)
GFR, Estimated: 37 mL/min — ABNORMAL LOW (ref >60)
Glucose, Bld: 110 mg/dL — ABNORMAL HIGH (ref 70–99)
Potassium: 4.8 mmol/L (ref 3.5–5.1)
Sodium: 130 mmol/L — ABNORMAL LOW (ref 135–145)

## 2023-09-21 LAB — MAGNESIUM: Magnesium: 2 mg/dL (ref 1.7–2.4)

## 2023-09-30 ENCOUNTER — Encounter: Payer: Self-pay | Admitting: Adult Health

## 2023-09-30 ENCOUNTER — Non-Acute Institutional Stay (SKILLED_NURSING_FACILITY): Payer: Medicare HMO | Admitting: Adult Health

## 2023-09-30 DIAGNOSIS — N189 Chronic kidney disease, unspecified: Secondary | ICD-10-CM

## 2023-09-30 DIAGNOSIS — Z794 Long term (current) use of insulin: Secondary | ICD-10-CM

## 2023-09-30 DIAGNOSIS — R339 Retention of urine, unspecified: Secondary | ICD-10-CM | POA: Diagnosis not present

## 2023-09-30 DIAGNOSIS — N319 Neuromuscular dysfunction of bladder, unspecified: Secondary | ICD-10-CM

## 2023-09-30 DIAGNOSIS — F419 Anxiety disorder, unspecified: Secondary | ICD-10-CM | POA: Diagnosis not present

## 2023-09-30 DIAGNOSIS — Z862 Personal history of diseases of the blood and blood-forming organs and certain disorders involving the immune mechanism: Secondary | ICD-10-CM

## 2023-09-30 NOTE — Progress Notes (Unsigned)
Location:  [facility] Room Number: 154 Place of Service:  SNF (31)   CODE STATUS: DNR  Allergies  Allergen Reactions   Ace Inhibitors Other (See Comments)    Hyperkalemia    Codeine     Unknown reaction   Sulfa Antibiotics     Unknown reaction    Chief Complaint  Patient presents with   Medical Management of Chronic Issues    Routine Visit   Immunizations    Covid   Health Maintenance    Diabetic kidney evaluation-Urine ACR    HPI:    Past Medical History:  Diagnosis Date   Acute cystitis without hematuria    Adult failure to thrive    Anemia    Anxiety    Atherosclerosis of aorta (HCC)    Central cord syndrome at C4 level of cervical spinal cord, subsequent encounter (HCC)    Chronic kidney disease, stage IV (severe) (HCC)    CKD (chronic kidney disease)    stage 3   Depression    DM type 2 with diabetic peripheral neuropathy (HCC)    Dysphagia    GERD (gastroesophageal reflux disease)    Gout    High cholesterol    HTN (hypertension)    Hyponatremia    MDD (major depressive disorder)    Neck pain    Neuropathy    Quadriplegia, C1-C4 incomplete (HCC)    Radiculopathy    RLS (restless legs syndrome)    Unspecified convulsions (HCC)    Urinary retention     Past Surgical History:  Procedure Laterality Date   ABDOMINAL HYSTERECTOMY     ANTERIOR CERVICAL DECOMP/DISCECTOMY FUSION N/A 02/05/2021   Procedure: Cervical Three-Four  Anterior cervical decompression/discectomy/fusion;  Surgeon: Coletta Memos, MD;  Location: [facility];  Service: Neurosurgery;  Laterality: N/A;  RM 20   APPENDECTOMY     BACK SURGERY     BALLOON DILATION N/A 11/19/2022   Procedure: BALLOON DILATION;  Surgeon: Lanelle Bal, DO;  [facility]: [facility];  Service: Endoscopy;  Laterality: N/A;   BIOPSY  09/22/2021   Procedure: BIOPSY;  Surgeon: Lanelle Bal, DO;  [facility];  Service: Endoscopy;;   BIOPSY  11/19/2022   Procedure: BIOPSY;   Surgeon: Lanelle Bal, DO;  [facility];  Service: Endoscopy;;   CERVICAL DISC SURGERY     ESOPHAGOGASTRODUODENOSCOPY (EGD) WITH PROPOFOL N/A 09/22/2021   Procedure: ESOPHAGOGASTRODUODENOSCOPY (EGD) WITH PROPOFOL;  Surgeon: Lanelle Bal, DO;  [facility];  Service: Endoscopy;  Laterality: N/A;  1:00pm   ESOPHAGOGASTRODUODENOSCOPY (EGD) WITH PROPOFOL N/A 11/19/2022   Procedure: ESOPHAGOGASTRODUODENOSCOPY (EGD) WITH PROPOFOL;  Surgeon: Lanelle Bal, DO;  [facility];  Service: Endoscopy;  Laterality: N/A;  11:30 am, asa 3   FLEXIBLE SIGMOIDOSCOPY  09/20/2023   Procedure: FLEXIBLE SIGMOIDOSCOPY;  Surgeon: Lanelle Bal, DO;  [facility]: [facility];  Service: Endoscopy;;   HAND SURGERY     KNEE SURGERY      Social History   Socioeconomic History   Marital status: Widowed    Spouse name: Not on file   Number of children: Not on file   Years of education: Not on file   Highest education level: Not on file  Occupational History   Not on file  Tobacco Use   Smoking status: Never   Smokeless tobacco: Never  Vaping Use   Vaping status: Never Used  Substance and Sexual Activity   Alcohol  use: Never   Drug use: Never   Sexual activity: Not Currently  Other Topics Concern   Not on file  Social History Narrative   Lives at [facility]   Social Drivers of Health   Financial Resource Strain: Not on file  Food Insecurity: No Food Insecurity (02/20/2023)   Hunger Vital Sign    Worried About Running Out of Food in the Last Year: Never true    Ran Out of Food in the Last Year: Never true  Transportation Needs: No Transportation Needs (02/20/2023)   PRAPARE - Administrator, Civil Service (Medical): No    Lack of Transportation (Non-Medical): No  Physical Activity: Not on file  Stress: Not on file  Social Connections: Not on file  Intimate Partner Violence: Not At Risk (02/20/2023)   Humiliation, Afraid, Rape, and Kick  questionnaire    Fear of Current or Ex-Partner: No    Emotionally Abused: No    Physically Abused: No    Sexually Abused: No   Family History  Problem Relation Age of Onset   Cancer Mother    Cardiomyopathy Father    Seizures Sister        childhood   Seizures Brother        in his 4s   Colon cancer Neg Hx       VITAL SIGNS BP (!) 141/79   Pulse 88   Temp (!) 97.4 F (36.3 C)   Resp 20   Ht 5\' 2"  (1.575 m)   Wt 185 lb (83.9 kg)   SpO2 97%   BMI 33.84 kg/m   Outpatient Encounter Medications as of 09/30/2023  Medication Sig   acetaminophen (TYLENOL) 325 MG tablet Take 650 mg by mouth 3 (three) times daily.   AMBULATORY NON FORMULARY MEDICATION Medication Name:  Continue monthly catheter changes with 47fr catheter at skilled nursing facility.   amLODipine (NORVASC) 10 MG tablet Take 10 mg by mouth daily.   calcium-vitamin D (OSCAL WITH D) 500-5 MG-MCG tablet Take 1 tablet by mouth 2 (two) times daily.   camphor-menthol (SARNA) lotion Apply 1 Application topically 2 (two) times daily as needed for itching.   dapagliflozin propanediol (FARXIGA) 5 MG TABS tablet Take 5 mg by mouth daily.   DULoxetine (CYMBALTA) 20 MG capsule Take 20 mg by mouth daily.   ferrous sulfate 325 (65 FE) MG EC tablet Take 1 tablet (325 mg total) by mouth daily with breakfast.   fexofenadine (ALLEGRA) 60 MG tablet Take 60 mg by mouth daily.   gabapentin (NEURONTIN) 300 MG capsule Take 300 mg by mouth 3 (three) times daily.   insulin aspart (NOVOLOG FLEXPEN) 100 UNIT/ML FlexPen Inject 10 Units into the skin 3 (three) times daily with meals. 8 am, 12 pm, and 6 pm   Insulin Pen Needle 30G X 5 MM MISC 1 Device by Does not apply route daily. 3/16"   ipratropium-albuterol (DUONEB) 0.5-2.5 (3) MG/3ML SOLN Take 3 mLs by nebulization every 6 (six) hours as needed (wheezing).   lamoTRIgine (LAMICTAL) 100 MG tablet Take 100 mg by mouth 2 (two) times daily.   lamoTRIgine 50 MG TBDP Take 50mg  PM in addition to  100mg  PM dose for 2 weeks then take twice daily in addition to 100mg  dosage   LANTUS SOLOSTAR 100 UNIT/ML Solostar Pen Inject 35 Units into the skin at bedtime.   levETIRAcetam (KEPPRA) 500 MG tablet Take 500 mg by mouth 2 (two) times daily. Renal dose   linaclotide (  LINZESS) 145 MCG CAPS capsule Take 145 mcg by mouth daily as needed.   LORazepam (ATIVAN) 2 MG/ML injection Inject 0.5 mLs (1 mg total) into the muscle every 15 (fifteen) minutes as needed.   melatonin 5 MG TABS Take 5 mg by mouth at bedtime.   NON FORMULARY Diet - Regular   ondansetron (ZOFRAN) 4 MG/5ML solution Take 4 mg by mouth every 8 (eight) hours as needed for nausea or vomiting.   Phenylephrine-Cocoa Butter 0.25-85.5 % SUPP Place 1 suppository rectally 2 (two) times daily as needed.   rOPINIRole (REQUIP) 1 MG tablet Take 1 mg by mouth at bedtime.   rosuvastatin (CRESTOR) 20 MG tablet Take 20 mg by mouth every evening.   senna (SENOKOT) 8.6 MG TABS tablet Take 1 tablet by mouth at bedtime.   sodium bicarbonate 650 MG tablet Take 1 tablet (650 mg total) by mouth 2 (two) times daily.   tiZANidine (ZANAFLEX) 2 MG tablet Take 2 mg by mouth every 6 (six) hours as needed for muscle spasms (for back and sciatic pain).   zinc oxide 20 % ointment Apply 1 Application topically See admin instructions. Apply every shift to bilateral buttocks, sacrum, and coccyx   apixaban (ELIQUIS) 5 MG TABS tablet Take 5 mg by mouth 2 (two) times daily. (Patient not taking: Reported on 09/30/2023)   augmented betamethasone dipropionate (DIPROLENE-AF) 0.05 % ointment Apply 1 Application topically 2 (two) times daily as needed. Apply to stomach and both breast (Patient not taking: Reported on 09/30/2023)   No facility-administered encounter medications on file as of 09/30/2023.     SIGNIFICANT DIAGNOSTIC EXAMS       ASSESSMENT/ PLAN:     Synthia Innocent NP [facility]  Contact [phone] Monday through Friday 8am- 5pm  After hours  call [phone]

## 2023-10-12 ENCOUNTER — Other Ambulatory Visit (HOSPITAL_COMMUNITY)
Admission: RE | Admit: 2023-10-12 | Discharge: 2023-10-12 | Disposition: A | Discharge status: Home / Self Care | Payer: Medicare HMO | Source: Skilled Nursing Facility | Attending: Adult Health | Admitting: Adult Health

## 2023-10-12 DIAGNOSIS — E1159 Type 2 diabetes mellitus with other circulatory complications: Secondary | ICD-10-CM | POA: Insufficient documentation

## 2023-10-13 LAB — MICROALBUMIN / CREATININE URINE RATIO
Creatinine, Urine: 28.9 mg/dL
Microalb Creat Ratio: 1050 mg/g{creat} — ABNORMAL HIGH (ref 0–29)
Microalb, Ur: 303.4 ug/mL — ABNORMAL HIGH

## 2023-10-15 DIAGNOSIS — L03031 Cellulitis of right toe: Secondary | ICD-10-CM | POA: Diagnosis not present

## 2023-10-15 DIAGNOSIS — Z794 Long term (current) use of insulin: Secondary | ICD-10-CM | POA: Diagnosis not present

## 2023-10-15 DIAGNOSIS — L602 Onychogryphosis: Secondary | ICD-10-CM | POA: Diagnosis not present

## 2023-10-15 DIAGNOSIS — E1151 Type 2 diabetes mellitus with diabetic peripheral angiopathy without gangrene: Secondary | ICD-10-CM | POA: Diagnosis not present

## 2023-10-15 DIAGNOSIS — L603 Nail dystrophy: Secondary | ICD-10-CM | POA: Diagnosis not present

## 2023-10-18 ENCOUNTER — Encounter: Payer: Self-pay | Admitting: Adult Health

## 2023-10-18 ENCOUNTER — Non-Acute Institutional Stay (SKILLED_NURSING_FACILITY): Payer: Self-pay | Admitting: Adult Health

## 2023-10-18 DIAGNOSIS — G8252 Quadriplegia, C1-C4 incomplete: Secondary | ICD-10-CM | POA: Diagnosis not present

## 2023-10-18 DIAGNOSIS — S14124S Central cord syndrome at C4 level of cervical spinal cord, sequela: Secondary | ICD-10-CM | POA: Diagnosis not present

## 2023-10-18 DIAGNOSIS — I700 Atherosclerosis of aorta: Secondary | ICD-10-CM

## 2023-10-18 NOTE — Progress Notes (Signed)
 Location:  [facility] Room Number: 149 Place of Service:  SNF (31)   CODE STATUS: dnr   Allergies  Allergen Reactions   Ace Inhibitors Other (See Comments)    Hyperkalemia    Codeine     Unknown reaction   Sulfa Antibiotics     Unknown reaction    Chief Complaint  Patient presents with   Medical Management of Chronic Issues          Protein calorie malnutrition severe:    Aortic atherosclerosis   Central cord syndrome at C4 level of cervical spine compression subsequent encounter/code compression quadriplegia C1-4     HPI:  She is a 88 year old long term resident of this facility being seen for the management of her chronic illnesses: Protein calorie malnutrition severe:    Aortic atherosclerosis   Central cord syndrome at C4 level of cervical spine compression subsequent encounter/code compression quadriplegia C1-4. There are no reports of uncontrolled pain. There have been no recent UTI. Her appetite remains good.   Past Medical History:  Diagnosis Date   Acute cystitis without hematuria    Adult failure to thrive    Anemia    Anxiety    Atherosclerosis of aorta (HCC)    Central cord syndrome at C4 level of cervical spinal cord, subsequent encounter (HCC)    Chronic kidney disease, stage IV (severe) (HCC)    CKD (chronic kidney disease)    stage 3   Depression    DM type 2 with diabetic peripheral neuropathy (HCC)    Dysphagia    GERD (gastroesophageal reflux disease)    Gout    High cholesterol    HTN (hypertension)    Hyponatremia    MDD (major depressive disorder)    Neck pain    Neuropathy    Quadriplegia, C1-C4 incomplete (HCC)    Radiculopathy    RLS (restless legs syndrome)    Unspecified convulsions (HCC)    Urinary retention     Past Surgical History:  Procedure Laterality Date   ABDOMINAL HYSTERECTOMY     ANTERIOR CERVICAL DECOMP/DISCECTOMY FUSION N/A 02/05/2021   Procedure: Cervical Three-Four  Anterior cervical  decompression/discectomy/fusion;  Surgeon: Coletta Memos, MD;  Location: [facility] OR;  Service: Neurosurgery;  Laterality: N/A;  RM 20   APPENDECTOMY     BACK SURGERY     BALLOON DILATION N/A 11/19/2022   Procedure: BALLOON DILATION;  Surgeon: Lanelle Bal, DO;  Location: [facility];  Service: Endoscopy;  Laterality: N/A;   BIOPSY  09/22/2021   Procedure: BIOPSY;  Surgeon: Lanelle Bal, DO;  Location: [facility];  Service: Endoscopy;;   BIOPSY  11/19/2022   Procedure: BIOPSY;  Surgeon: Lanelle Bal, DO;  Location: [facility];  Service: Endoscopy;;   CERVICAL DISC SURGERY     ESOPHAGOGASTRODUODENOSCOPY (EGD) WITH PROPOFOL N/A 09/22/2021   Procedure: ESOPHAGOGASTRODUODENOSCOPY (EGD) WITH PROPOFOL;  Surgeon: Lanelle Bal, DO;  Location: [facility];  Service: Endoscopy;  Laterality: N/A;  1:00pm   ESOPHAGOGASTRODUODENOSCOPY (EGD) WITH PROPOFOL N/A 11/19/2022   Procedure: ESOPHAGOGASTRODUODENOSCOPY (EGD) WITH PROPOFOL;  Surgeon: Lanelle Bal, DO;  Location: [facility];  Service: Endoscopy;  Laterality: N/A;  11:30 am, asa 3   FLEXIBLE SIGMOIDOSCOPY  09/20/2023   Procedure: FLEXIBLE SIGMOIDOSCOPY;  Surgeon: Lanelle Bal, DO;  Location: [facility];  Service: Endoscopy;;   HAND SURGERY     KNEE SURGERY      Social History  Socioeconomic History   Marital status: Widowed    Spouse name: Not on file   Number of children: Not on file   Years of education: Not on file   Highest education level: Not on file  Occupational History   Not on file  Tobacco Use   Smoking status: Never   Smokeless tobacco: Never  Vaping Use   Vaping status: Never Used  Substance and Sexual Activity   Alcohol use: Never   Drug use: Never   Sexual activity: Not Currently  Other Topics Concern   Not on file  Social History Narrative   Lives at [facility]   Social Drivers of Health   Financial Resource Strain: Not on file  Food Insecurity: No Food Insecurity  (02/20/2023)   Hunger Vital Sign    Worried About Running Out of Food in the Last Year: Never true    Ran Out of Food in the Last Year: Never true  Transportation Needs: No Transportation Needs (02/20/2023)   PRAPARE - Administrator, Civil Service (Medical): No    Lack of Transportation (Non-Medical): No  Physical Activity: Not on file  Stress: Not on file  Social Connections: Not on file  Intimate Partner Violence: Not At Risk (02/20/2023)   Humiliation, Afraid, Rape, and Kick questionnaire    Fear of Current or Ex-Partner: No    Emotionally Abused: No    Physically Abused: No    Sexually Abused: No   Family History  Problem Relation Age of Onset   Cancer Mother    Cardiomyopathy Father    Seizures Sister        childhood   Seizures Brother        in his 30s   Colon cancer Neg Hx       VITAL SIGNS BP (!) 140/80   Pulse 81   Temp (!) 97.3 F (36.3 C)   Resp 19   Ht 5\' 2"  (1.575 m)   Wt 184 lb 11.2 oz (83.8 kg)   SpO2 98%   BMI 33.78 kg/m   Outpatient Encounter Medications as of 10/18/2023  Medication Sig   acetaminophen (TYLENOL) 325 MG tablet Take 650 mg by mouth 3 (three) times daily.   AMBULATORY NON FORMULARY MEDICATION Medication Name:  Continue monthly catheter changes with 71fr catheter at skilled nursing facility.   amLODipine (NORVASC) 10 MG tablet Take 10 mg by mouth daily.   apixaban (ELIQUIS) 5 MG TABS tablet Take 5 mg by mouth 2 (two) times daily. (Patient not taking: Reported on 09/30/2023)   augmented betamethasone dipropionate (DIPROLENE-AF) 0.05 % ointment Apply 1 Application topically 2 (two) times daily as needed. Apply to stomach and both breast (Patient not taking: Reported on 09/30/2023)   calcium-vitamin D (OSCAL WITH D) 500-5 MG-MCG tablet Take 1 tablet by mouth 2 (two) times daily.   camphor-menthol (SARNA) lotion Apply 1 Application topically 2 (two) times daily as needed for itching.   dapagliflozin propanediol (FARXIGA) 5 MG TABS  tablet Take 5 mg by mouth daily.   DULoxetine (CYMBALTA) 20 MG capsule Take 20 mg by mouth daily.   ferrous sulfate 325 (65 FE) MG EC tablet Take 1 tablet (325 mg total) by mouth daily with breakfast.   fexofenadine (ALLEGRA) 60 MG tablet Take 60 mg by mouth daily.   gabapentin (NEURONTIN) 300 MG capsule Take 300 mg by mouth 3 (three) times daily.   insulin aspart (NOVOLOG FLEXPEN) 100 UNIT/ML FlexPen Inject 10 Units into the  skin 3 (three) times daily with meals. 8 am, 12 pm, and 6 pm   Insulin Pen Needle 30G X 5 MM MISC 1 Device by Does not apply route daily. 3/16"   ipratropium-albuterol (DUONEB) 0.5-2.5 (3) MG/3ML SOLN Take 3 mLs by nebulization every 6 (six) hours as needed (wheezing).   lamoTRIgine (LAMICTAL) 100 MG tablet Take 100 mg by mouth 2 (two) times daily.   lamoTRIgine 50 MG TBDP Take 50mg  PM in addition to 100mg  PM dose for 2 weeks then take twice daily in addition to 100mg  dosage   LANTUS SOLOSTAR 100 UNIT/ML Solostar Pen Inject 35 Units into the skin at bedtime.   levETIRAcetam (KEPPRA) 500 MG tablet Take 500 mg by mouth 2 (two) times daily. Renal dose   linaclotide (LINZESS) 145 MCG CAPS capsule Take 145 mcg by mouth daily as needed.   LORazepam (ATIVAN) 2 MG/ML injection Inject 0.5 mLs (1 mg total) into the muscle every 15 (fifteen) minutes as needed.   melatonin 5 MG TABS Take 5 mg by mouth at bedtime.   NON FORMULARY Diet - Regular   ondansetron (ZOFRAN) 4 MG/5ML solution Take 4 mg by mouth every 8 (eight) hours as needed for nausea or vomiting.   Phenylephrine-Cocoa Butter 0.25-85.5 % SUPP Place 1 suppository rectally 2 (two) times daily as needed.   rOPINIRole (REQUIP) 1 MG tablet Take 1 mg by mouth at bedtime.   rosuvastatin (CRESTOR) 20 MG tablet Take 20 mg by mouth every evening.   senna (SENOKOT) 8.6 MG TABS tablet Take 1 tablet by mouth at bedtime.   sodium bicarbonate 650 MG tablet Take 1 tablet (650 mg total) by mouth 2 (two) times daily.   tiZANidine (ZANAFLEX) 2  MG tablet Take 2 mg by mouth every 6 (six) hours as needed for muscle spasms (for back and sciatic pain).   zinc oxide 20 % ointment Apply 1 Application topically See admin instructions. Apply every shift to bilateral buttocks, sacrum, and coccyx   No facility-administered encounter medications on file as of 10/18/2023.     SIGNIFICANT DIAGNOSTIC EXAMS  PREVIOUS   04-15-22; dexa: t score -3.391  NO NEW EXAMS    LABS REVIEWED PREVIOUS   10-01-22: wbc 5.6; hgb 9.6; hct 29.9; mcv 94.9 plt 245; glucose 404; bun 43; creat 1.69; k+ 5.2; na++ 135; ca 8.2 gfr 30 protein 7.2 albumin 3.5 10-19-22: glucose 176; bun 45; creat 1.63; k+ 5.8; na++ 134; ca 8.9; gfr 31 10-31-22: wbc 4.6; hgb 8.4; hct 26.5; mcv 94.0 plt 183; glucose 93; bun 46; creat 1.64; k+ 5.2; na++ 134; ca 8.6; gfr 31; protein 6.5 albumin 3.2  11-05-22: glucose 117; bun 35; creat 1.25; k+ 4.3;na++ 137; ca 7.5; gfr 43; hgb A1c 8.5; vitamin D 26.12; chol 149; ldl 73 trig 166; hdl 43  01-25-23: wbc 4.5; hgb 8.8; hct 27.0; mcv 94.7 plt 178; glucose 163; bun 45; creat 1.58; k+ 5.8; na++ 134; ca 8.7; gfr 32; protein 6.6 albumin 3.4 iron 54; tibc 305; vitamin B12: 496; keppra 80.7 (normal 10-40) 01-26-23: glucose 116; bun 41; creat 1.54; k+ 5.5; na++ 133; ca 8.5 gfr 33   02-08-23: keppra 41.6 (10-40) 02-18-23: hgb A1c 7.5; tsh 5.113 02-19-23: wbc 6.0; hgb 7.9; hct 24.7; mcv 95.7 plt 202; glucose 225; bun 45; creat 1.76; k+ 6.4; na++ 130; ca 8.7; gfr 28; (repeat k+ 5.4) 02-20-23: wbc 6.2; hgb 7.5; hct 24.1; mcv 95.6 plt 186;glucose 195; bun 45; creat 1.76; k+ 5.6; na++ 134; ca 8.5 gfr 28;  protein 6.2; albumin 3.1 free t4: 0.80; mag 1.8 02-21-23: vitamin B12 781; folate 26.1; iron 26; tibc 266; ferritin 139 03-23-23: wbc 6.3; hgb 10.0; hct 31.0; mcv 92.8 plt 186; glucose 239; bun 41; creat 1.35; k+ 4.6; na++ 135; ca 8.7; gfr 39; protein 7.6 albumin 3.6  04-05-23: glucose 215; bun 49; creat 1.88; k+ 4.3; na++ 134; ca 8.4; gfr 26 04-29-23: hgb A1c 10.2    06-24-23: chol 140; ldl 64; trig 190; hdl 38 06-28-23: urine culture: proteus mirabilis: cipro  07-26-23: glucose 39; bun 30; creat 1.54; k+ 4.7; na++ 140; ca 9.0; gfr 33 hgb A1c 7.1  09-21-23: glucose 110 bun 23; creat 1.41; k+ 4.8; na++ 130; ca 9.0; gfr 37; mag 2.0   TODAY  10-12-23: ACR 1050.   Review of Systems  Constitutional:  Negative for malaise/fatigue.  Respiratory:  Negative for cough and shortness of breath.   Cardiovascular:  Negative for chest pain, palpitations and leg swelling.  Gastrointestinal:  Negative for abdominal pain, constipation and heartburn.  Musculoskeletal:  Negative for back pain, joint pain and myalgias.  Skin: Negative.   Neurological:  Negative for dizziness.  Psychiatric/Behavioral:  The patient is not nervous/anxious.    Physical Exam Constitutional:      General: She is not in acute distress.    Appearance: She is well-developed. She is obese. She is not diaphoretic.  Neck:     Thyroid: No thyromegaly.  Cardiovascular:     Rate and Rhythm: Normal rate and regular rhythm.     Pulses: Normal pulses.     Heart sounds: Normal heart sounds.  Pulmonary:     Effort: Pulmonary effort is normal. No respiratory distress.     Breath sounds: Normal breath sounds.  Abdominal:     General: Bowel sounds are normal. There is no distension.     Palpations: Abdomen is soft.     Tenderness: There is no abdominal tenderness.  Genitourinary:    Comments: foley Musculoskeletal:     Cervical back: Neck supple.     Right lower leg: No edema.     Left lower leg: No edema.     Comments:  Limited range of motion in upper extremities Does not move lower extremities     Lymphadenopathy:     Cervical: No cervical adenopathy.  Skin:    General: Skin is warm and dry.  Neurological:     Mental Status: She is alert. Mental status is at baseline.     Comments: SLUMS 14/30  Psychiatric:        Mood and Affect: Mood normal.       ASSESSMENT/  PLAN:  TODAY  Protein calorie malnutrition severe: albumin 3.6; protein 6.7; is presently off supplements  2. Aortic atherosclerosis (ct 01-26-21) is on asa and statin  3. Central cord syndrome at C4 level of cervical spine compression subsequent encounter/code compression quadriplegia C1-4 (01-25-21) asa 81 mg daily zanaflex 2 mg every 6 hours as needed for spasticity.   PREVIOUS   4. Neurocognitive deficits: 03-22-23: SLUMS 14/30.   5. Restless leg syndrome: will continue requip 1 mg nightly   6. Hypertension associated with stage 4 chronic kidney disease due to type 2 diabetes mellitus: 140/80 will continue  norvasc  10 mg daily   7. Seizure; will continue keppra 500 mg twice daily did not tolerate lower dose. Will continue lamictal 100 mg twice daily  neurology is aware  8. Hyperlipidemia associated with type 2 diabetes mellitus: ldl 73 will continue  crestor 20 mg daily will check lipids  9. Post menopausal osteoporosis: t score -3.391   10. GERD without esophagitis: is off medications    11. Chronic constipation: will continue miralax daily and linzess 145 mcg daily as needed  12. 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 Lumbar radicular syndrome/avascular necrosis bilateral hips; will continue tylenol 650 mg every 8 hours; gabapentin 300 mg three times daily and cymbalta 20 mg daily   14. Diabetic peripheral neuropathy: will continue gabapentin 300 mg three times daily   15. CKD stage 4 due to type 2 diabetes mellitus: bun 23; creat 1.41 gfr 37   16. Anemia associated with type 2 diabetes mellitus due to underlying condition: hgb 8.5 will monitor   17. Urine retention/neurogenic bladder: has long term foley; is followed by urology  18. Chronic anxiety: is off medications.     Gwendolyn Innocent NP [facility]   call [phone]

## 2023-10-29 ENCOUNTER — Encounter (INDEPENDENT_AMBULATORY_CARE_PROVIDER_SITE_OTHER): Payer: Self-pay

## 2023-11-01 ENCOUNTER — Ambulatory Visit: Payer: Medicare HMO | Admitting: Gastroenterology

## 2023-11-02 NOTE — Progress Notes (Signed)
 Referring Provider: Sharee Holster, NP Primary Care Physician:  Sharee Holster, NP Primary GI Physician: Dr. Marletta Lor  Chief Complaint  Patient presents with   Follow-up    Having issues with nausea, left lower pain    HPI:   [name] is a 88 y.o. female with history of CKD,  anemia of chronic disease, diabetes, HTN, HLD, central cord syndrome at C4 secondary to fall in May 2022, constipation, chronic left sided abdominal pain, mild diverticulitis  June 2024,   presenting today for follow-up s/p colonoscopy.   Last seen in the office 07/26/2023.  Reported bowel movements make her tail sore, but did have some relief of abdominal pain after a bowel movement.  She was started on Senokot at the [facility] and was also on Linzess 145 mcg daily.  Due to history of prior diverticulitis, chronic left-sided abdominal pain, planned for colonoscopy.  Noted PCP recently discontinued omeprazole due to constipation and patient had not had any significant recurrent GERD symptoms.  Colonoscopy 09/20/2023: Extensive amounts of solid stool in the rectum and rectosigmoid precluding visualization.  Recommended returning to the GI clinic in 6 weeks with need of aggressive bowel regimen.   Today:  Having worsening LLQ abdominal pain. States she is having BMs daily. Having nausea sometimes. Some heartburn but doesn't want anything for this. States it isn't often. Dughter thinks this may occur more frequently than she is reporting. Daughter states she will be there in the morning and patient will report heartburn or nausea after breakfast  No known brbpr or melena.  Overall, difficult historian going back and forth in her responses  Doesn't remember bowel prep.   Spoke with patient's nurse Vicky: States patient started colon prep with her but not sure if she finished it after she left.  Bms: Reports patient having 10 Bms on 2/28. Not sure what happened there. Typically with just 1 BM per day. Good  amount.   Current constipation meds: Senokot nightly. Linzess 72 mcg prn. Not in the last 14 days. On 2/18, Linzess 145 mcg was stopped. 2/20, Linzess 72 mcg daily was started. Then on 2/28, it was changed to prn.  Left sided pain: Patient report occasional pain, but nothing significant.  Bleeding: None.  N/V: Reported some nausea maybe once in the last few weeks, but nothing routine.  Heartburn: Complains from time to time.  Po intake:  Eats fine.    Past Medical History:  Diagnosis Date   Acute cystitis without hematuria    Adult failure to thrive    Anemia    Anxiety    Atherosclerosis of aorta (HCC)    Central cord syndrome at C4 level of cervical spinal cord, subsequent encounter (HCC)    Chronic kidney disease, stage IV (severe) (HCC)    CKD (chronic kidney disease)    stage 3   Depression    DM type 2 with diabetic peripheral neuropathy (HCC)    Dysphagia    GERD (gastroesophageal reflux disease)    Gout    High cholesterol    HTN (hypertension)    Hyponatremia    MDD (major depressive disorder)    Neck pain    Neuropathy    Quadriplegia, C1-C4 incomplete (HCC)    Radiculopathy    RLS (restless legs syndrome)    Unspecified convulsions (HCC)    Urinary retention     Past Surgical History:  Procedure Laterality Date   ABDOMINAL HYSTERECTOMY     ANTERIOR  CERVICAL DECOMP/DISCECTOMY FUSION N/A 02/05/2021   Procedure: Cervical Three-Four  Anterior cervical decompression/discectomy/fusion;  Surgeon: Coletta Memos, MD;  Location: [facility] OR;  Service: Neurosurgery;  Laterality: N/A;  RM 20   APPENDECTOMY     BACK SURGERY     BALLOON DILATION N/A 11/19/2022   Procedure: BALLOON DILATION;  Surgeon: Lanelle Bal, DO;  [facility];  Service: Endoscopy;  Laterality: N/A;   BIOPSY  09/22/2021   Procedure: BIOPSY;  Surgeon: Lanelle Bal, DO;  [facility];  Service: Endoscopy;;   BIOPSY  11/19/2022   Procedure: BIOPSY;  Surgeon: Lanelle Bal, DO;   [facility];  Service: Endoscopy;;   CERVICAL DISC SURGERY     ESOPHAGOGASTRODUODENOSCOPY (EGD) WITH PROPOFOL N/A 09/22/2021   Procedure: ESOPHAGOGASTRODUODENOSCOPY (EGD) WITH PROPOFOL;  Surgeon: Lanelle Bal, DO;  [facility];  Service: Endoscopy;  Laterality: N/A;  1:00pm   ESOPHAGOGASTRODUODENOSCOPY (EGD) WITH PROPOFOL N/A 11/19/2022   Procedure: ESOPHAGOGASTRODUODENOSCOPY (EGD) WITH PROPOFOL;  Surgeon: Lanelle Bal, DO;  [facility];  Service: Endoscopy;  Laterality: N/A;  11:30 am, asa 3   FLEXIBLE SIGMOIDOSCOPY  09/20/2023   Procedure: FLEXIBLE SIGMOIDOSCOPY;  Surgeon: Lanelle Bal, DO;  [facility];  Service: Endoscopy;;   HAND SURGERY     KNEE SURGERY      Current Outpatient Medications  Medication Sig Dispense Refill   acetaminophen (TYLENOL) 325 MG tablet Take 650 mg by mouth 3 (three) times daily.     AMBULATORY NON FORMULARY MEDICATION Medication Name:  Continue monthly catheter changes with 67fr catheter at skilled nursing facility. 1 application MONTHLY   amLODipine (NORVASC) 10 MG tablet Take 10 mg by mouth daily.     calcium-vitamin D (OSCAL WITH D) 500-5 MG-MCG tablet Take 1 tablet by mouth 2 (two) times daily.     camphor-menthol (SARNA) lotion Apply 1 Application topically 2 (two) times daily as needed for itching.     Continuous Glucose Receiver (FREESTYLE LIBRE 2 READER) DEVI by Does not apply route.     dapagliflozin propanediol (FARXIGA) 5 MG TABS tablet Take 5 mg by mouth daily.     DULoxetine (CYMBALTA) 20 MG capsule Take 20 mg by mouth daily.     ferrous sulfate 325 (65 FE) MG EC tablet Take 1 tablet (325 mg total) by mouth daily with breakfast. 60 tablet 3   fexofenadine (ALLEGRA) 60 MG tablet Take 60 mg by mouth daily.     gabapentin (NEURONTIN) 300 MG capsule Take 300 mg by mouth 3 (three) times daily.     insulin aspart (NOVOLOG FLEXPEN) 100 UNIT/ML FlexPen Inject 10 Units into the skin 3 (three)  times daily with meals. 8 am, 12 pm, and 6 pm     Insulin Pen Needle 30G X 5 MM MISC 1 Device by Does not apply route daily. 3/16"  0   ipratropium-albuterol (DUONEB) 0.5-2.5 (3) MG/3ML SOLN Take 3 mLs by nebulization every 6 (six) hours as needed (wheezing).     lamoTRIgine (LAMICTAL) 100 MG tablet Take 100 mg by mouth 2 (two) times daily.     lamoTRIgine 50 MG TBDP Take 50mg  PM in addition to 100mg  PM dose for 2 weeks then take twice daily in addition to 100mg  dosage 60 tablet 5   LANTUS SOLOSTAR 100 UNIT/ML Solostar Pen Inject 35 Units into the skin at bedtime.     levETIRAcetam (KEPPRA) 500 MG tablet Take 500 mg by mouth 2 (two)  times daily. Renal dose     linaclotide (LINZESS) 72 MCG capsule Take 72 mcg by mouth as needed.     LORazepam (ATIVAN) 2 MG/ML injection Inject 0.5 mLs (1 mg total) into the muscle every 15 (fifteen) minutes as needed. 4 mL 0   melatonin 5 MG TABS Take 5 mg by mouth at bedtime.     NON FORMULARY Diet - Regular     ondansetron (ZOFRAN) 4 MG/5ML solution Take 4 mg by mouth every 8 (eight) hours as needed for nausea or vomiting.     Phenylephrine-Cocoa Butter 0.25-85.5 % SUPP Place 1 suppository rectally 2 (two) times daily as needed.     povidone-iodine (BETADINE) 10 % external solution Apply 1 Application topically as needed for wound care.     rOPINIRole (REQUIP) 1 MG tablet Take 1 mg by mouth at bedtime.     rosuvastatin (CRESTOR) 20 MG tablet Take 20 mg by mouth every evening.     senna (SENOKOT) 8.6 MG TABS tablet Take 1 tablet by mouth at bedtime.     sodium bicarbonate 650 MG tablet Take 1 tablet (650 mg total) by mouth 2 (two) times daily.     tiZANidine (ZANAFLEX) 2 MG tablet Take 2 mg by mouth every 6 (six) hours as needed for muscle spasms (for back and sciatic pain).     zinc oxide 20 % ointment Apply 1 Application topically See admin instructions. Apply every shift to bilateral buttocks, sacrum, and coccyx     No current facility-administered medications  for this visit.    Allergies as of 11/04/2023 - Review Complete 11/04/2023  Allergen Reaction Noted   Ace inhibitors Other (See Comments) 08/29/2021   Codeine  03/09/2018   Sulfa antibiotics  03/25/2021    Family History  Problem Relation Age of Onset   Cancer Mother    Cardiomyopathy Father    Seizures Sister        childhood   Seizures Brother        in his 35s   Colon cancer Neg Hx     Social History   Socioeconomic History   Marital status: Widowed    Spouse name: Not on file   Number of children: Not on file   Years of education: Not on file   Highest education level: Not on file  Occupational History   Not on file  Tobacco Use   Smoking status: Never   Smokeless tobacco: Never  Vaping Use   Vaping status: Never Used  Substance and Sexual Activity   Alcohol use: Never   Drug use: Never   Sexual activity: Not Currently  Other Topics Concern   Not on file  Social History Narrative   Lives at [facility]   Social Drivers of Health   Financial Resource Strain: Not on file  Food Insecurity: No Food Insecurity (02/20/2023)   Hunger Vital Sign    Worried About Running Out of Food in the Last Year: Never true    Ran Out of Food in the Last Year: Never true  Transportation Needs: No Transportation Needs (02/20/2023)   PRAPARE - Administrator, Civil Service (Medical): No    Lack of Transportation (Non-Medical): No  Physical Activity: Not on file  Stress: Not on file  Social Connections: Not on file    Review of Systems: Gen: Denies fever, chills, cold or flu like symptoms, pre-syncope, or syncope.  GI: See HPI. Heme: See HPI  Physical Exam: BP Marland Kitchen)  140/70 (BP Location: Right Arm, Patient Position: Sitting, Cuff Size: Small)   Pulse 64   Temp (!) 97.4 F (36.3 C) (Oral)   Ht 5\' 2"  (1.575 m)   Wt 184 lb (83.5 kg) Comment: facility reported 10/18/23  SpO2 94%   BMI 33.65 kg/m  General:   Alert and oriented. No distress noted.  Pleasant and cooperative. Patient intermittently grimacing in pain and rubbing left abdomen.  Head:  Normocephalic and atraumatic. Eyes:  Conjuctiva clear without scleral icterus. Heart:  S1, S2 present without murmurs appreciated. Lungs:  Clear to auscultation bilaterally. No wheezes, rales, or rhonchi. No distress.  Abdomen:  +BS, obese, soft, mild epigastric and moderate LLQ TTP.  No rebound or guarding. No HSM or masses noted. Msk:  Symmetrical without gross deformities. Normal posture. Extremities:  Without edema. Neurologic:  Alert and  oriented x4 Psych:  Normal mood and affect.    Assessment:  88 y.o. female with history of CKD,  anemia of chronic disease, diabetes, HTN, HLD, central cord syndrome at C4 secondary to fall in May 2022, constipation, chronic left sided abdominal pain, mild diverticulitis  June 2024,   presenting today for follow-up s/p colonoscopy.   Constipation:  Chronic.  Likely driven by lack of mobility in setting of central cord syndrome, but may also have a component of IBS.  Currently managing her symptoms with Senokot nightly and Linzess 72 mcg as needed. Patients nurse reports patient is having a good BM daily. She was previously on Linzess 145 mcg daily, but this was changed by provider at nursing facility due to frequent bowel movements.  Notably, recent colonoscopy attempted in January with poor prep though after discussion with nurse today, sounds like patient likely did not complete her bowel prep.  Due to left lower quadrant abdominal pain as discussed below, we are planning for a CT.  For now, I will continue her current bowel regimen.  If any evidence of stool burden on CT, we will need to be more aggressive with bowel regimen.   LLQ abdominal pain:  Chronic.  Felt to be secondary to chronic constipation previously/possible IBS, but also with history of mild diverticulitis and is reporting worsening left lower quadrant abdominal pain today.  She does have  moderate tenderness in left lower quadrant.  Will arrange CT for further evaluation. It is possible she could have symptomatic diverticulosis as well.  Notably, recently attempted colonoscopy in January 2025 due to history of diverticulitis and chronic LLQ abdominal pain, but this was unsuccessful due to extensive amounts of semisolid stool in the rectum and rectosigmoid colon.   GERD/nausea:  History of GERD.  Currently off medications due to prior issues with constipation with omeprazole.  Reports intermittent heartburn though difficult to tell how frequent this is.  Nurse states patient complains of them from time to time, but again unable to tell me how often.  Daughter feels that symptoms may be more frequent and patient is reporting. Also with occasional nausea which could be secondary to GERD. No vomiting. She does have mild TTP in epigastric area on exam today. We discussed starting PPI, but patient declined.    Plan:  CBC, CMP CT A/P without contrast. Continue Senokot nightly Continue Linzess 72 mcg as needed If stool burden noted on CT, will need to get more aggressive with her bowel regimen. Offered daily PPI, but patient declined for now.  Further recommendations pending CT.    Ermalinda Memos, PA-C [facility] 11/04/2023

## 2023-11-04 ENCOUNTER — Ambulatory Visit: Payer: Medicare HMO | Admitting: Gastroenterology

## 2023-11-04 ENCOUNTER — Encounter: Payer: Self-pay | Admitting: Gastroenterology

## 2023-11-04 ENCOUNTER — Telehealth: Payer: Self-pay | Admitting: *Deleted

## 2023-11-04 VITALS — BP 140/70 | HR 64 | Temp 97.4°F | Ht 62.0 in | Wt 184.0 lb

## 2023-11-04 DIAGNOSIS — K5909 Other constipation: Secondary | ICD-10-CM | POA: Diagnosis not present

## 2023-11-04 DIAGNOSIS — R10816 Epigastric abdominal tenderness: Secondary | ICD-10-CM | POA: Diagnosis not present

## 2023-11-04 DIAGNOSIS — R1032 Left lower quadrant pain: Secondary | ICD-10-CM

## 2023-11-04 DIAGNOSIS — K219 Gastro-esophageal reflux disease without esophagitis: Secondary | ICD-10-CM

## 2023-11-04 DIAGNOSIS — R110 Nausea: Secondary | ICD-10-CM | POA: Diagnosis not present

## 2023-11-04 DIAGNOSIS — K5900 Constipation, unspecified: Secondary | ICD-10-CM

## 2023-11-04 DIAGNOSIS — Z8719 Personal history of other diseases of the digestive system: Secondary | ICD-10-CM | POA: Diagnosis not present

## 2023-11-04 NOTE — Telephone Encounter (Addendum)
 PA approved via cohere. Authorization #[id number] DOS: 11/04/2023 - 01/03/2024  [facility] and spoke with Vickie. Aware of CT appt for 3/7, arrival 10:30am, liquids only 4 hours prior. Also let [facility] know unable to do STAT today as only 1 tech in radiology

## 2023-11-04 NOTE — Patient Instructions (Signed)
 I have ordered for you to have blood work and a CT of your abdomen and pelvis.  Will have further recommendations for you pending these results.  Ermalinda Memos, PA-C [facility]

## 2023-11-05 ENCOUNTER — Other Ambulatory Visit: Payer: Self-pay | Admitting: Gastroenterology

## 2023-11-05 ENCOUNTER — Ambulatory Visit (HOSPITAL_COMMUNITY)
Admission: RE | Admit: 2023-11-05 | Discharge: 2023-11-05 | Disposition: A | Discharge status: Home / Self Care | Source: Ambulatory Visit | Attending: Gastroenterology | Admitting: Gastroenterology

## 2023-11-05 ENCOUNTER — Encounter: Payer: Self-pay | Admitting: Gastroenterology

## 2023-11-05 DIAGNOSIS — R1032 Left lower quadrant pain: Secondary | ICD-10-CM | POA: Diagnosis not present

## 2023-11-05 DIAGNOSIS — K5730 Diverticulosis of large intestine without perforation or abscess without bleeding: Secondary | ICD-10-CM | POA: Diagnosis not present

## 2023-11-05 DIAGNOSIS — K5900 Constipation, unspecified: Secondary | ICD-10-CM

## 2023-11-05 MED ORDER — LINACLOTIDE 145 MCG PO CAPS: 145.0000 ug | ORAL_CAPSULE | Freq: Every day | ORAL | 3 refills | Status: DC

## 2023-11-05 MED ORDER — BISACODYL 10 MG RE SUPP
10.0000 mg | RECTAL | 0 refills | Status: DC | PRN
Start: 2023-11-05 — End: 2024-01-25

## 2023-11-09 ENCOUNTER — Other Ambulatory Visit (HOSPITAL_COMMUNITY)
Admission: RE | Admit: 2023-11-09 | Discharge: 2023-11-09 | Disposition: A | Discharge status: Home / Self Care | Source: Skilled Nursing Facility | Attending: Adult Health | Admitting: Adult Health

## 2023-11-09 DIAGNOSIS — N184 Chronic kidney disease, stage 4 (severe): Secondary | ICD-10-CM | POA: Insufficient documentation

## 2023-11-09 LAB — BASIC METABOLIC PANEL
Anion gap: 13 (ref 5–15)
BUN: 27 mg/dL — ABNORMAL HIGH (ref 8–23)
CO2: 23 mmol/L (ref 22–32)
Calcium: 9.2 mg/dL (ref 8.9–10.3)
Chloride: 101 mmol/L (ref 98–111)
Creatinine, Ser: 1.83 mg/dL — ABNORMAL HIGH (ref 0.44–1.00)
GFR, Estimated: 27 mL/min — ABNORMAL LOW (ref >60)
Glucose, Bld: 93 mg/dL (ref 70–99)
Potassium: 4.2 mmol/L (ref 3.5–5.1)
Sodium: 137 mmol/L (ref 135–145)

## 2023-11-10 ENCOUNTER — Other Ambulatory Visit (HOSPITAL_COMMUNITY)
Admission: RE | Admit: 2023-11-10 | Discharge: 2023-11-10 | Disposition: A | Discharge status: Home / Self Care | Source: Skilled Nursing Facility | Attending: Internal Medicine | Admitting: Internal Medicine

## 2023-11-10 DIAGNOSIS — R053 Chronic cough: Secondary | ICD-10-CM | POA: Diagnosis not present

## 2023-11-10 DIAGNOSIS — Z1152 Encounter for screening for COVID-19: Secondary | ICD-10-CM | POA: Diagnosis not present

## 2023-11-10 DIAGNOSIS — Z1383 Encounter for screening for respiratory disorder NEC: Secondary | ICD-10-CM | POA: Insufficient documentation

## 2023-11-10 LAB — RESP PANEL BY RT-PCR (RSV, FLU A&B, COVID)  RVPGX2
Influenza A by PCR: NEGATIVE
Influenza B by PCR: NEGATIVE
Resp Syncytial Virus by PCR: NEGATIVE
SARS Coronavirus 2 by RT PCR: NEGATIVE

## 2023-11-11 ENCOUNTER — Encounter: Payer: Self-pay | Admitting: Internal Medicine

## 2023-11-11 ENCOUNTER — Non-Acute Institutional Stay (SKILLED_NURSING_FACILITY): Payer: Self-pay | Admitting: Internal Medicine

## 2023-11-11 DIAGNOSIS — E1122 Type 2 diabetes mellitus with diabetic chronic kidney disease: Secondary | ICD-10-CM | POA: Diagnosis not present

## 2023-11-11 DIAGNOSIS — N184 Chronic kidney disease, stage 4 (severe): Secondary | ICD-10-CM

## 2023-11-11 DIAGNOSIS — R4189 Other symptoms and signs involving cognitive functions and awareness: Secondary | ICD-10-CM

## 2023-11-11 DIAGNOSIS — R29818 Other symptoms and signs involving the nervous system: Secondary | ICD-10-CM

## 2023-11-11 DIAGNOSIS — R1032 Left lower quadrant pain: Secondary | ICD-10-CM

## 2023-11-11 NOTE — Progress Notes (Unsigned)
 NURSING HOME LOCATION:  [facility] ROOM NUMBER:  [phone]  CODE STATUS:  DNR  PCP: Sharee Holster, NP   This is a nursing facility follow up visit for of chronic medical diagnoses to document compliance with Regulation 483.30 (c) in The Long Term Care Survey Manual Phase 2 which mandates caregiver visit ( visits can alternate among physician, PA or NP as per statutes) within 10 days of 30 days / 60 days/ 90 days post admission to SNF date  .  Interim medical record and care since last SNF visit was updated with review of diagnostic studies and change in clinical status since last visit were documented.  HPI: She is a permanent resident of this facility with medical diagnoses of adult failure to thrive syndrome, aortic atherosclerosis, central cord syndrome at C4, diabetes with CKD stage IV, chronic depression, GERD with dysphagia, history of gout, dyslipidemia, and urinary retention. She was seen by GI on 11/04/2023 for worsening left lower quadrant abdominal pain associated with nausea.  She also validated dyspepsia but did not want any medication for this.  She had previously been on omeprazole but this was discontinued because of constipation.  She is on Senokot nightly &  Linzess 72 mcg as needed for constipation. Colonoscopy was attempted in January of this year but could not be completed due to poor prep.  To assess the constipation and left lower quadrant abdominal pain CT imaging was recommended.  She was felt to have an element of IBS as possible etiology of her symptoms although she does have a history of mild diverticulitis. BMET was performed 3/11 and revealed progression of her CKD with a creatinine 1.83 and GFR 27; in January creatinine about 1.41 and GFR 37.  Serially since May 2024 the GFR has ranged from a nadir value of 26 up to peak of 39. GI consultant mentioned repeating the CBC & a CMET; but the most recent CBC revealed H/H of  9.3/27.6 on 09/20/2023.  Serially  from July 2024.  Hemoglobin has ranged from a low of 8.4 to a peak of 10.0. CT of the abdomen revealed chronic diverticulosis without suggestion of diverticulitis.  Cholelithiasis versus gallbladder sludge was documented. No evidence of associated cholecystitis.  There was a large amount of stool in the rectum and sigmoid colon with distention of the rectum. 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   Review of systems: Dementia invalidated responses.  Initially she stated "I cannot remember" in reference to any active symptomatology.  She went on to say "I do not think so."  Subsequently she mentioned nausea and vomiting that she thought "day before yesterday."  She could not give me the date.  According to staff notes she had nausea and vomiting the evening of 3/12 approximately 9:30 PM.  She validates some intermittent heartburn as well as some intermittent left upper and left lower quadrant abdominal discomfort which she cannot quantitate or qualify.  She stated that her "variant is sore at times."  She describes occasional numbness in her hands.  Physical exam:  Pertinent or positive findings: Facies are blank.  She does not have significant point facial wrinkling.  She is wearing only the upper plate.  There is slight increase in second heart sound with intermittent splitting.  Breath sounds are decreased.  Abdomen is distended.  She has intermittent gurgling bowel sounds as well as gaseous changes.  Clinically there is no ileus as the abdomen is soft and nontender to palpation.  Pedal pulses are decreased especially posterior tibial pulses.  She has trace edema at the sock line.  She has no strength in the lower extremities to opposition.  There is a small polypoid lesion over the left forehead laterally.  General appearance: Adequately nourished; no acute distress, increased work of breathing is present.   Lymphatic: No lymphadenopathy about the head, neck, axilla. Eyes: No  conjunctival inflammation or lid edema is present. There is no scleral icterus. Ears:  External ear exam shows no significant lesions or deformities.   Nose:  External nasal examination shows no deformity or inflammation. Nasal mucosa are pink and moist without lesions, exudates Oral exam:  Lips and gums are healthy appearing. There is no oropharyngeal erythema or exudate. Neck:  No thyromegaly, masses, tenderness noted.    Heart:  Normal rate and regular rhythm. S1 and S2 normal without gallop, murmur, click, rub .  Lungs: Chest clear to auscultation without wheezes, rhonchi, rales, rubs. Abdomen: Bowel sounds are normal. Abdomen is soft and nontender with no organomegaly, hernias, masses. GU: Deferred  Extremities:  No cyanosis, clubbing, edema  Neurologic exam : Cn 2-7 intact Strength equal  in upper & lower extremities Balance, Rhomberg, finger to nose testing could not be completed due to clinical state Deep tendon reflexes are equal Skin: Warm & dry w/o tenting. No significant lesions or rash.  See summary under each active problem in the Problem List with associated updated therapeutic plan

## 2023-11-11 NOTE — Patient Instructions (Signed)
 See assessment and plan under each diagnosis in the problem list and acutely for this visit

## 2023-11-11 NOTE — Assessment & Plan Note (Signed)
 GFR has ranged from a high of 39 to a nadir of 26. Current GFR is 27 indicating CKD Stage 4. Last A1c was 7.1% on 07/26/23; this should be updated later this month.

## 2023-11-11 NOTE — Assessment & Plan Note (Signed)
 No behavioral issues reported . She can provide no meaningful history.

## 2023-11-11 NOTE — Assessment & Plan Note (Signed)
 Her neurocognitive issues make the findings in the abdominal process difficult at best.  Today she validated having nausea and vomiting 48 hours ago.  According to the staff the nausea and vomiting was last evening approximately 9:30 PM.  GI questions IBS.  CT revealed chronic diverticulosis without diverticulitis.  Cholelithiasis versus gallbladder sludge was also documented.  Her discomfort is intermittent and in the left upper and lower quadrants.  This is in the context of chronic constipation.  CT revealed a large amount of stool in the rectum and sigmoid colon with rectal distention suggesting possible fecal impaction.  Today she is basically asymptomatic in reference to GI symptomatology; but again the neurocognitive issues invalidate the history. If symptoms progress; CBC and differential, CMP, and lipase will be performed.

## 2023-11-22 ENCOUNTER — Non-Acute Institutional Stay (SKILLED_NURSING_FACILITY): Payer: Self-pay | Admitting: Student

## 2023-11-22 ENCOUNTER — Other Ambulatory Visit (HOSPITAL_COMMUNITY)
Admission: RE | Admit: 2023-11-22 | Discharge: 2023-11-22 | Disposition: A | Discharge status: Home / Self Care | Source: Skilled Nursing Facility | Attending: Internal Medicine | Admitting: Internal Medicine

## 2023-11-22 DIAGNOSIS — E1122 Type 2 diabetes mellitus with diabetic chronic kidney disease: Secondary | ICD-10-CM

## 2023-11-22 DIAGNOSIS — N184 Chronic kidney disease, stage 4 (severe): Secondary | ICD-10-CM

## 2023-11-22 DIAGNOSIS — E1159 Type 2 diabetes mellitus with other circulatory complications: Secondary | ICD-10-CM | POA: Diagnosis not present

## 2023-11-22 DIAGNOSIS — R1032 Left lower quadrant pain: Secondary | ICD-10-CM | POA: Diagnosis not present

## 2023-11-22 LAB — CBC WITH DIFFERENTIAL/PLATELET
Abs Immature Granulocytes: 0.01 10*3/uL (ref 0.00–0.07)
Basophils Absolute: 0 10*3/uL (ref 0.0–0.1)
Basophils Relative: 0 %
Eosinophils Absolute: 0.3 10*3/uL (ref 0.0–0.5)
Eosinophils Relative: 6 %
HCT: 26.2 % — ABNORMAL LOW (ref 36.0–46.0)
Hemoglobin: 8.5 g/dL — ABNORMAL LOW (ref 12.0–15.0)
Immature Granulocytes: 0 %
Lymphocytes Relative: 36 %
Lymphs Abs: 1.6 10*3/uL (ref 0.7–4.0)
MCH: 30.9 pg (ref 26.0–34.0)
MCHC: 32.4 g/dL (ref 30.0–36.0)
MCV: 95.3 fL (ref 80.0–100.0)
Monocytes Absolute: 0.2 10*3/uL (ref 0.1–1.0)
Monocytes Relative: 5 %
Neutro Abs: 2.4 10*3/uL (ref 1.7–7.7)
Neutrophils Relative %: 53 %
Platelets: 216 10*3/uL (ref 150–400)
RBC: 2.75 MIL/uL — ABNORMAL LOW (ref 3.87–5.11)
RDW: 14.9 % (ref 11.5–15.5)
WBC: 4.6 10*3/uL (ref 4.0–10.5)
nRBC: 0 % (ref 0.0–0.2)

## 2023-11-22 LAB — COMPREHENSIVE METABOLIC PANEL
ALT: 9 U/L (ref 0–44)
AST: 14 U/L — ABNORMAL LOW (ref 15–41)
Albumin: 3.3 g/dL — ABNORMAL LOW (ref 3.5–5.0)
Alkaline Phosphatase: 60 U/L (ref 38–126)
Anion gap: 10 (ref 5–15)
BUN: 39 mg/dL — ABNORMAL HIGH (ref 8–23)
CO2: 22 mmol/L (ref 22–32)
Calcium: 9.3 mg/dL (ref 8.9–10.3)
Chloride: 103 mmol/L (ref 98–111)
Creatinine, Ser: 1.58 mg/dL — ABNORMAL HIGH (ref 0.44–1.00)
GFR, Estimated: 32 mL/min — ABNORMAL LOW (ref >60)
Glucose, Bld: 169 mg/dL — ABNORMAL HIGH (ref 70–99)
Potassium: 4.5 mmol/L (ref 3.5–5.1)
Sodium: 135 mmol/L (ref 135–145)
Total Bilirubin: 0.2 mg/dL (ref 0.0–1.2)
Total Protein: 6.8 g/dL (ref 6.5–8.1)

## 2023-11-22 LAB — HEMOGLOBIN A1C
Hgb A1c MFr Bld: 6 % — ABNORMAL HIGH (ref 4.8–5.6)
Mean Plasma Glucose: 125.5 mg/dL

## 2023-11-22 NOTE — Progress Notes (Signed)
 Location:  [facility]   Place of Service:      CODE STATUS: DNR  Allergies  Allergen Reactions   Ace Inhibitors Other (See Comments)    Hyperkalemia    Codeine     Unknown reaction   Sulfa Antibiotics     Unknown reaction    No chief complaint on file.   HPI:  Regulatory visit PMHx CKD IV, DM, HLD, urinary retention, GERD, depression, failure to thrive, central cord syndrome, aortic atherosclerosis, urinary retention   On chart review, she has been experiencing nausea/vomiting, heartburn. Recent CT abdomen showed diverticulosis and GI proposed IBS as cause of symptoms. She also battles with chronic constipation.  Complained of LLQ pain yesterday. Last vomited on 3/17 per documentation.   Today, she says she is hungry and has no complaints    Past Medical History:  Diagnosis Date   Acute cystitis without hematuria    Adult failure to thrive    Anemia    Anxiety    Atherosclerosis of aorta (HCC)    Central cord syndrome at C4 level of cervical spinal cord, subsequent encounter (HCC)    Chronic kidney disease, stage IV (severe) (HCC)    CKD (chronic kidney disease)    stage 3   Depression    DM type 2 with diabetic peripheral neuropathy (HCC)    Dysphagia    GERD (gastroesophageal reflux disease)    Gout    High cholesterol    HTN (hypertension)    Hyponatremia    MDD (major depressive disorder)    Neck pain    Neuropathy    Quadriplegia, C1-C4 incomplete (HCC)    Radiculopathy    RLS (restless legs syndrome)    Unspecified convulsions (HCC)    Urinary retention     Past Surgical History:  Procedure Laterality Date   ABDOMINAL HYSTERECTOMY     ANTERIOR CERVICAL DECOMP/DISCECTOMY FUSION N/A 02/05/2021   Procedure: Cervical Three-Four  Anterior cervical decompression/discectomy/fusion;  Surgeon: Coletta Memos, MD;  Location: [facility] OR;  Service: Neurosurgery;  Laterality: N/A;  RM 20   APPENDECTOMY     BACK SURGERY     BALLOON DILATION N/A 11/19/2022    Procedure: BALLOON DILATION;  Surgeon: Lanelle Bal, DO;  Location: [facility];  Service: Endoscopy;  Laterality: N/A;   BIOPSY  09/22/2021   Procedure: BIOPSY;  Surgeon: Lanelle Bal, DO;  Location: [facility];  Service: Endoscopy;;   BIOPSY  11/19/2022   Procedure: BIOPSY;  Surgeon: Lanelle Bal, DO;  Location: [facility];  Service: Endoscopy;;   CERVICAL DISC SURGERY     ESOPHAGOGASTRODUODENOSCOPY (EGD) WITH PROPOFOL N/A 09/22/2021   Procedure: ESOPHAGOGASTRODUODENOSCOPY (EGD) WITH PROPOFOL;  Surgeon: Lanelle Bal, DO;  Location: [facility];  Service: Endoscopy;  Laterality: N/A;  1:00pm   ESOPHAGOGASTRODUODENOSCOPY (EGD) WITH PROPOFOL N/A 11/19/2022   Procedure: ESOPHAGOGASTRODUODENOSCOPY (EGD) WITH PROPOFOL;  Surgeon: Lanelle Bal, DO;  Location: [facility];  Service: Endoscopy;  Laterality: N/A;  11:30 am, asa 3   FLEXIBLE SIGMOIDOSCOPY  09/20/2023   Procedure: FLEXIBLE SIGMOIDOSCOPY;  Surgeon: Lanelle Bal, DO;  Location: [facility];  Service: Endoscopy;;   HAND SURGERY     KNEE SURGERY      Social History   Socioeconomic History   Marital status: Widowed    Spouse name: Not on file   Number of children: Not on file   Years of education: Not on file   Highest education level:  Not on file  Occupational History   Not on file  Tobacco Use   Smoking status: Never   Smokeless tobacco: Never  Vaping Use   Vaping status: Never Used  Substance and Sexual Activity   Alcohol use: Never   Drug use: Never   Sexual activity: Not Currently  Other Topics Concern   Not on file  Social History Narrative   Lives at [facility]   Social Drivers of Health   Financial Resource Strain: Not on file  Food Insecurity: No Food Insecurity (02/20/2023)   Hunger Vital Sign    Worried About Running Out of Food in the Last Year: Never true    Ran Out of Food in the Last Year: Never true  Transportation Needs: No Transportation Needs  (02/20/2023)   PRAPARE - Administrator, Civil Service (Medical): No    Lack of Transportation (Non-Medical): No  Physical Activity: Not on file  Stress: Not on file  Social Connections: Not on file  Intimate Partner Violence: Not At Risk (02/20/2023)   Humiliation, Afraid, Rape, and Kick questionnaire    Fear of Current or Ex-Partner: No    Emotionally Abused: No    Physically Abused: No    Sexually Abused: No   Family History  Problem Relation Age of Onset   Cancer Mother    Cardiomyopathy Father    Seizures Sister        childhood   Seizures Brother        in his 30s   Colon cancer Neg Hx       VITAL SIGNS There were no vitals taken for this visit.  Outpatient Encounter Medications as of 11/22/2023  Medication Sig   acetaminophen (TYLENOL) 325 MG tablet Take 650 mg by mouth 3 (three) times daily.   AMBULATORY NON FORMULARY MEDICATION Medication Name:  Continue monthly catheter changes with 11fr catheter at skilled nursing facility.   amLODipine (NORVASC) 10 MG tablet Take 10 mg by mouth daily.   bisacodyl (DULCOLAX) 10 MG suppository Place 1 suppository (10 mg total) rectally as needed for moderate constipation (give after rectal exam if evidence of hard stool in retal vault).   calcium-vitamin D (OSCAL WITH D) 500-5 MG-MCG tablet Take 1 tablet by mouth 2 (two) times daily.   camphor-menthol (SARNA) lotion Apply 1 Application topically 2 (two) times daily as needed for itching.   Continuous Glucose Receiver (FREESTYLE LIBRE 2 READER) DEVI by Does not apply route.   dapagliflozin propanediol (FARXIGA) 5 MG TABS tablet Take 5 mg by mouth daily.   DULoxetine (CYMBALTA) 20 MG capsule Take 20 mg by mouth daily.   ferrous sulfate 325 (65 FE) MG EC tablet Take 1 tablet (325 mg total) by mouth daily with breakfast.   fexofenadine (ALLEGRA) 60 MG tablet Take 60 mg by mouth daily.   gabapentin (NEURONTIN) 300 MG capsule Take 300 mg by mouth 3 (three) times daily.    insulin aspart (NOVOLOG FLEXPEN) 100 UNIT/ML FlexPen Inject 10 Units into the skin 3 (three) times daily with meals. 8 am, 12 pm, and 6 pm   Insulin Pen Needle 30G X 5 MM MISC 1 Device by Does not apply route daily. 3/16"   ipratropium-albuterol (DUONEB) 0.5-2.5 (3) MG/3ML SOLN Take 3 mLs by nebulization every 6 (six) hours as needed (wheezing).   lamoTRIgine (LAMICTAL) 100 MG tablet Take 100 mg by mouth 2 (two) times daily.   lamoTRIgine 50 MG TBDP Take 50mg  PM in addition to  100mg  PM dose for 2 weeks then take twice daily in addition to 100mg  dosage   LANTUS SOLOSTAR 100 UNIT/ML Solostar Pen Inject 35 Units into the skin at bedtime.   levETIRAcetam (KEPPRA) 500 MG tablet Take 500 mg by mouth 2 (two) times daily. Renal dose   linaclotide (LINZESS) 145 MCG CAPS capsule Take 1 capsule (145 mcg total) by mouth daily before breakfast.   LORazepam (ATIVAN) 2 MG/ML injection Inject 0.5 mLs (1 mg total) into the muscle every 15 (fifteen) minutes as needed.   melatonin 5 MG TABS Take 5 mg by mouth at bedtime.   NON FORMULARY Diet - Regular   ondansetron (ZOFRAN) 4 MG/5ML solution Take 4 mg by mouth every 8 (eight) hours as needed for nausea or vomiting.   Phenylephrine-Cocoa Butter 0.25-85.5 % SUPP Place 1 suppository rectally 2 (two) times daily as needed.   povidone-iodine (BETADINE) 10 % external solution Apply 1 Application topically as needed for wound care.   rOPINIRole (REQUIP) 1 MG tablet Take 1 mg by mouth at bedtime.   rosuvastatin (CRESTOR) 20 MG tablet Take 20 mg by mouth every evening.   senna (SENOKOT) 8.6 MG TABS tablet Take 1 tablet by mouth at bedtime.   sodium bicarbonate 650 MG tablet Take 1 tablet (650 mg total) by mouth 2 (two) times daily.   tiZANidine (ZANAFLEX) 2 MG tablet Take 2 mg by mouth every 6 (six) hours as needed for muscle spasms (for back and sciatic pain).   zinc oxide 20 % ointment Apply 1 Application topically See admin instructions. Apply every shift to bilateral  buttocks, sacrum, and coccyx   No facility-administered encounter medications on file as of 11/22/2023.     SIGNIFICANT DIAGNOSTIC EXAMS  General: smiling, well appearing, pleasant  Heart: RRR, normal S1/S2 Lungs: CTAB, normal effort Abdomen: bowel sounds present, mildly distended, mild tenderness of LLQ without guarding, soft  Extremities: no edema BLEs Neuro: alert, no focal deficits      ASSESSMENT/ PLAN:  T2DM A1c 7.1 on 07/26/23 CBGs mostly in 90's-low 100's with outliers as low as 65 and high as 153 -Continue lantus 30 U qhs -Consider lowering Novolog if she does not eat and based on A1c -Continue farxiga 5 mg daily  -HgbA1c today  CKD IV Worsening with Cr 1.83 on 11/09/23 CrCl ~22 based on ideal body weight  UmACR significantly elevated to 1050 last month, up from 59 07/2022 -repeat UmACR -recheck Cr w/ CMP Continue farxiga 5 mg daily  Continue sodium bicarb 650 mg BID  Constipation, abdominal pain, nausea and vomiting Still with mild LLQ abdominal pain only on palpation which is chronic in nature  Maybe related to IBS per GI Per nursing, has been having frequent loose stools daily -Continue senna and Zofran prn  -Decrease Linzess to 72 mcg every other day  -CMP and CBC w/ diff   HTN Continue Amlodipine 10 mg daily   Anemia Hgb around baseline of 9 on 09/20/23 Continue iron supplement MWF Check CBC  Osteoporosis  Continue Ca and Vit D  Chronic pain Tylenol TID for chronic shoulder pain  Gabapentin 300 mg TID for diabetic neuropathy - consider decreasing to max of 600 mg /day in divided doses if CrCl remains <30  Seizures No recent seizure activity  Continue keppra 500 mg BID and lamotrigine 150 mg BID   Depression Continue Cymbalta 20 mg daily   HLD Continue Crestor 20 mg daily, consider decreasing to 10 mg daily if CrCl remains <30  Maralyn Sago  [facility], PGY-3

## 2023-11-23 ENCOUNTER — Encounter: Payer: Self-pay | Admitting: Internal Medicine

## 2023-11-23 ENCOUNTER — Non-Acute Institutional Stay (SKILLED_NURSING_FACILITY): Payer: Self-pay | Admitting: Internal Medicine

## 2023-11-23 DIAGNOSIS — E1140 Type 2 diabetes mellitus with diabetic neuropathy, unspecified: Secondary | ICD-10-CM | POA: Diagnosis not present

## 2023-11-23 DIAGNOSIS — Z794 Long term (current) use of insulin: Secondary | ICD-10-CM

## 2023-11-23 DIAGNOSIS — E1122 Type 2 diabetes mellitus with diabetic chronic kidney disease: Secondary | ICD-10-CM | POA: Diagnosis not present

## 2023-11-23 DIAGNOSIS — K219 Gastro-esophageal reflux disease without esophagitis: Secondary | ICD-10-CM | POA: Diagnosis not present

## 2023-11-23 DIAGNOSIS — N184 Chronic kidney disease, stage 4 (severe): Secondary | ICD-10-CM | POA: Diagnosis not present

## 2023-11-23 LAB — MICROALBUMIN / CREATININE URINE RATIO
Creatinine, Urine: 30 mg/dL
Microalb Creat Ratio: 3223 mg/g{creat} — ABNORMAL HIGH (ref 0–29)
Microalb, Ur: 966.9 ug/mL — ABNORMAL HIGH

## 2023-11-23 NOTE — Progress Notes (Unsigned)
 NURSING HOME LOCATION:  [facility] ROOM NUMBER:  [phone]  CODE STATUS:  Full Code  PCP: Sharee Holster, NP   This is a nursing facility follow up visit for specific acute issue of follow-up of abnormal labs.  Interim medical record and care since last SNF visit was updated with review of diagnostic studies and change in clinical status since last visit were documented.  HPI: She is a permanent resident of this facility with medical diagnoses of diabetes with advanced CKD stage IV, central cord syndrome, aortic valve stenosis, and neurocognitive deficits. Recently glucoses range from a low of 65 up to 153.  She is on multiple diabetic agents including 30 units of Lantus and 10 units of short acting insulin before meals.  A1c was performed 3/24 and revealed a value of 6%.  Since 04/29/2023 there has been a dramatic improvement in her diabetic control.  At that time A1c was 10.6% indicating extremely poor control with over 70% increased risk of neuro or cardiovascular complication.  This subsequently dropped to 7.1% on 07/26/2023 prior to the value of 6% on 11/22/2023. There has been significant improvement in her creatinine with a current value of 1.58 down from 1.83.  GFR has increased from 27 up to 32 indicating low stage IIIb CKD.  Despite the stability and slight improvement in the CKD,  microalbumin:creatinine ratio has risen from prior value of 1050 up to 3223. Normochromic, normocytic anemia is relatively stable with current values of 8.5/26.2.  Dating back to June 2024 H/H has ranged from 7/21.5 up to 10/31.0.  She is being followed by GI; her next appointment is in April. Protein/caloric malnutrition is present with a current albumin 3.3; nadir value was 2.9 in June 2024.  Current total protein is low normal at 6.8.  Nadir value was 6.2 in June 2024.  Review of systems: Dementia invalidated responses.  Her complaints related to GI issues vary at each visit.  She did not  remember seeing the [facility] yesterday.  She denies any abdominal pain at this time which contradicts her usual report.  She describes heartburn occasionally but not daily.  She thinks she may have had this yesterday and last week.  The same comment was made about N&V. She validated having had diarrhea ,described as liquid stool.  She states her feet feel cool. In reference to her diabetes; staff reports her family brings in Butter fingers, Milky Ways, and Dr. Alcus Dad.  Constitutional: No fever, significant weight change, fatigue  Eyes: No redness, discharge, pain, vision change ENT/mouth: No nasal congestion,  purulent discharge, earache, change in hearing, sore throat  Cardiovascular: No chest pain, palpitations, paroxysmal nocturnal dyspnea, edema  Respiratory: No cough, sputum production, hemoptysis, DOE, significant snoring, apnea   Gastrointestinal: No dysphagia,  rectal bleeding, melena Genitourinary: No dysuria, hematuria, pyuria, incontinence, nocturia Musculoskeletal: No joint stiffness, joint swelling, weakness, pain Dermatologic: No rash, pruritus, change in appearance of skin Neurologic: No dizziness, headache, syncope, seizures, numbness, tingling Psychiatric: No significant anxiety, depression, insomnia, anorexia Endocrine: No change in hair/skin/nails, excessive thirst, excessive hunger, excessive urination  Hematologic/lymphatic: No significant bruising, lymphadenopathy, abnormal bleeding Allergy/immunology: No itchy/watery eyes, significant sneezing, urticaria, angioedema  Physical exam:  Pertinent or positive findings: Lacrimal glands are prominent.  She is wearing only the upper plate.  Second heart sound is increased.  Abdomen is protuberant.  Her pulses are decreased.  The feet are warm without ischemic change.  General appearance: Adequately nourished; no acute distress,  increased work of breathing is present.   Lymphatic: No lymphadenopathy about the head, neck,  axilla. Eyes: No conjunctival inflammation or lid edema is present. There is no scleral icterus. Ears:  External ear exam shows no significant lesions or deformities.   Nose:  External nasal examination shows no deformity or inflammation. Nasal mucosa are pink and moist without lesions, exudates Oral exam:  Lips and gums are healthy appearing. There is no oropharyngeal erythema or exudate. Neck:  No thyromegaly, masses, tenderness noted.    Heart:  Normal rate and regular rhythm. S1 normal without gallop, murmur, click, rub .  Lungs: without wheezes, rhonchi, rales, rubs. Abdomen: Bowel sounds are normal. Abdomen is soft and nontender with no organomegaly, hernias, masses. GU: Deferred  Extremities:  No cyanosis, clubbing, edema  Neurologic exam :Balance, Rhomberg, finger to nose testing could not be completed due to clinical state Skin: Warm & dry w/o tenting. No significant lesions or rash.  See summary under each active problem in the Problem List with associated updated therapeutic plan

## 2023-11-23 NOTE — Patient Instructions (Signed)
 See assessment and plan under each diagnosis in the problem list and acutely for this visit

## 2023-11-23 NOTE — Assessment & Plan Note (Signed)
 She describes occasional intermittent heartburn.  I discussed the triggers with her which would most likely be with chocolates and the Dr. Reino Kent brought in by family.  Asked to see if she can make a connection; I doubt she will be able to do so because of the neurocognitive issues.

## 2023-11-23 NOTE — Assessment & Plan Note (Signed)
 CKD is stable to slightly improved with current creatinine of 1.58 and GFR 32, compatible with CKD low stage IIIb. Despite this severity of CKD, her anemia has been relatively stable.

## 2023-11-23 NOTE — Assessment & Plan Note (Signed)
 Recently glucose range has been 65 up to 153.  This would correspond to a current A1c of 6%.  Because of this dramatic improvement; Lantus will be decreased to 25 units and short acting insulin prior to meals decreased to 5 units.  Monitor will continue; at this time there is not appear to be discordance related to the advanced CKD.

## 2023-11-26 ENCOUNTER — Ambulatory Visit: Payer: Medicare HMO | Admitting: Urology

## 2023-11-30 NOTE — Addendum Note (Signed)
 Addended by: Sharee Holster on: 11/30/2023 10:04 AM   Modules accepted: Level of Service

## 2023-12-06 ENCOUNTER — Ambulatory Visit: Admitting: Gastroenterology

## 2023-12-10 ENCOUNTER — Non-Acute Institutional Stay (SKILLED_NURSING_FACILITY): Payer: Self-pay | Admitting: Adult Health

## 2023-12-10 ENCOUNTER — Encounter: Payer: Self-pay | Admitting: Adult Health

## 2023-12-10 DIAGNOSIS — I700 Atherosclerosis of aorta: Secondary | ICD-10-CM

## 2023-12-10 DIAGNOSIS — I129 Hypertensive chronic kidney disease with stage 1 through stage 4 chronic kidney disease, or unspecified chronic kidney disease: Secondary | ICD-10-CM | POA: Diagnosis not present

## 2023-12-10 DIAGNOSIS — E1122 Type 2 diabetes mellitus with diabetic chronic kidney disease: Secondary | ICD-10-CM

## 2023-12-10 DIAGNOSIS — N184 Chronic kidney disease, stage 4 (severe): Secondary | ICD-10-CM | POA: Diagnosis not present

## 2023-12-10 DIAGNOSIS — E1142 Type 2 diabetes mellitus with diabetic polyneuropathy: Secondary | ICD-10-CM

## 2023-12-10 NOTE — Progress Notes (Signed)
 Location:  [facility] Room Number: 149 Place of Service:  SNF (31)   CODE STATUS: dnr   Allergies  Allergen Reactions   Ace Inhibitors Other (See Comments)    Hyperkalemia    Codeine     Unknown reaction   Sulfa Antibiotics     Unknown reaction    Chief Complaint  Patient presents with   Acute Visit    Care plan meeting.     HPI:  We have come together for her care plan meeting. BIMS 15/15 mood 0/30. Does get out of bed to geri-chair; no falls. She requires dependent assist with her adl care. She has a foley is incontinent of bowel. Dietary: setup for meals; regular diet appetite 26-75%; weight is 186 pounds. Therapy: none at this time. She continues to be followed for her chronic illnesses including:  Aortic atherosclerosis  Hypertension associated with stage 4 chronic kidney disease due to type 2 diabetes mellitus  Diabetic peripheral neuropathy   Past Medical History:  Diagnosis Date   Acute cystitis without hematuria    Adult failure to thrive    Anemia    Anxiety    Atherosclerosis of aorta (HCC)    Central cord syndrome at C4 level of cervical spinal cord, subsequent encounter (HCC)    Chronic kidney disease, stage IV (severe) (HCC)    CKD (chronic kidney disease)    stage 3   Depression    DM type 2 with diabetic peripheral neuropathy (HCC)    Dysphagia    GERD (gastroesophageal reflux disease)    Gout    High cholesterol    HTN (hypertension)    Hyponatremia    MDD (major depressive disorder)    Neck pain    Neuropathy    Quadriplegia, C1-C4 incomplete (HCC)    Radiculopathy    RLS (restless legs syndrome)    Unspecified convulsions (HCC)    Urinary retention     Past Surgical History:  Procedure Laterality Date   ABDOMINAL HYSTERECTOMY     ANTERIOR CERVICAL DECOMP/DISCECTOMY FUSION N/A 02/05/2021   Procedure: Cervical Three-Four  Anterior cervical decompression/discectomy/fusion;  Surgeon: Coletta Memos, MD;  Location: [facility] OR;   Service: Neurosurgery;  Laterality: N/A;  RM 20   APPENDECTOMY     BACK SURGERY     BALLOON DILATION N/A 11/19/2022   Procedure: BALLOON DILATION;  Surgeon: Lanelle Bal, DO;  Location: [facility];  Service: Endoscopy;  Laterality: N/A;   BIOPSY  09/22/2021   Procedure: BIOPSY;  Surgeon: Lanelle Bal, DO;  Location: [facility];  Service: Endoscopy;;   BIOPSY  11/19/2022   Procedure: BIOPSY;  Surgeon: Lanelle Bal, DO;  Location: [facility];  Service: Endoscopy;;   CERVICAL DISC SURGERY     ESOPHAGOGASTRODUODENOSCOPY (EGD) WITH PROPOFOL N/A 09/22/2021   Procedure: ESOPHAGOGASTRODUODENOSCOPY (EGD) WITH PROPOFOL;  Surgeon: Lanelle Bal, DO;  Location: [facility];  Service: Endoscopy;  Laterality: N/A;  1:00pm   ESOPHAGOGASTRODUODENOSCOPY (EGD) WITH PROPOFOL N/A 11/19/2022   Procedure: ESOPHAGOGASTRODUODENOSCOPY (EGD) WITH PROPOFOL;  Surgeon: Lanelle Bal, DO;  Location: [facility];  Service: Endoscopy;  Laterality: N/A;  11:30 am, asa 3   FLEXIBLE SIGMOIDOSCOPY  09/20/2023   Procedure: FLEXIBLE SIGMOIDOSCOPY;  Surgeon: Lanelle Bal, DO;  Location: [facility];  Service: Endoscopy;;   HAND SURGERY     KNEE SURGERY      Social History   Socioeconomic History   Marital status: Widowed  Spouse name: Not on file   Number of children: Not on file   Years of education: Not on file   Highest education level: Not on file  Occupational History   Not on file  Tobacco Use   Smoking status: Never   Smokeless tobacco: Never  Vaping Use   Vaping status: Never Used  Substance and Sexual Activity   Alcohol use: Never   Drug use: Never   Sexual activity: Not Currently  Other Topics Concern   Not on file  Social History Narrative   Lives at [facility]   Social Drivers of Health   Financial Resource Strain: Not on file  Food Insecurity: No Food Insecurity (02/20/2023)   Hunger Vital Sign    Worried About Running Out of Food in the Last  Year: Never true    Ran Out of Food in the Last Year: Never true  Transportation Needs: No Transportation Needs (02/20/2023)   PRAPARE - Administrator, Civil Service (Medical): No    Lack of Transportation (Non-Medical): No  Physical Activity: Not on file  Stress: Not on file  Social Connections: Not on file  Intimate Partner Violence: Not At Risk (02/20/2023)   Humiliation, Afraid, Rape, and Kick questionnaire    Fear of Current or Ex-Partner: No    Emotionally Abused: No    Physically Abused: No    Sexually Abused: No   Family History  Problem Relation Age of Onset   Cancer Mother    Cardiomyopathy Father    Seizures Sister        childhood   Seizures Brother        in his 77s   Colon cancer Neg Hx       VITAL SIGNS BP 134/69   Pulse 72   Temp 98.1 F (36.7 C)   Resp 18   Ht 5\' 2"  (1.575 m)   Wt 186 lb (84.4 kg)   SpO2 98%   BMI 34.02 kg/m   Outpatient Encounter Medications as of 12/10/2023  Medication Sig   acetaminophen (TYLENOL) 325 MG tablet Take 650 mg by mouth 3 (three) times daily.   AMBULATORY NON FORMULARY MEDICATION Medication Name:  Continue monthly catheter changes with 21fr catheter at skilled nursing facility.   amLODipine (NORVASC) 10 MG tablet Take 10 mg by mouth daily.   bisacodyl (DULCOLAX) 10 MG suppository Place 1 suppository (10 mg total) rectally as needed for moderate constipation (give after rectal exam if evidence of hard stool in retal vault).   calcium-vitamin D (OSCAL WITH D) 500-5 MG-MCG tablet Take 1 tablet by mouth 2 (two) times daily.   camphor-menthol (SARNA) lotion Apply 1 Application topically 2 (two) times daily as needed for itching.   Continuous Glucose Receiver (FREESTYLE LIBRE 2 READER) DEVI by Does not apply route.   dapagliflozin propanediol (FARXIGA) 5 MG TABS tablet Take 5 mg by mouth daily.   DULoxetine (CYMBALTA) 20 MG capsule Take 20 mg by mouth daily.   ferrous sulfate 325 (65 FE) MG EC tablet Take 1  tablet (325 mg total) by mouth daily with breakfast.   fexofenadine (ALLEGRA) 60 MG tablet Take 60 mg by mouth daily.   gabapentin (NEURONTIN) 300 MG capsule Take 300 mg by mouth 3 (three) times daily.   insulin aspart (NOVOLOG FLEXPEN) 100 UNIT/ML FlexPen Inject 10 Units into the skin 3 (three) times daily with meals. 8 am, 12 pm, and 6 pm   Insulin Pen Needle 30G X 5  MM MISC 1 Device by Does not apply route daily. 3/16"   ipratropium-albuterol (DUONEB) 0.5-2.5 (3) MG/3ML SOLN Take 3 mLs by nebulization every 6 (six) hours as needed (wheezing).   lamoTRIgine (LAMICTAL) 100 MG tablet Take 100 mg by mouth 2 (two) times daily.   lamoTRIgine 50 MG TBDP Take 50mg  PM in addition to 100mg  PM dose for 2 weeks then take twice daily in addition to 100mg  dosage   LANTUS SOLOSTAR 100 UNIT/ML Solostar Pen Inject 35 Units into the skin at bedtime.   levETIRAcetam (KEPPRA) 500 MG tablet Take 500 mg by mouth 2 (two) times daily. Renal dose   linaclotide (LINZESS) 145 MCG CAPS capsule Take 1 capsule (145 mcg total) by mouth daily before breakfast.   LORazepam (ATIVAN) 2 MG/ML injection Inject 0.5 mLs (1 mg total) into the muscle every 15 (fifteen) minutes as needed.   melatonin 5 MG TABS Take 5 mg by mouth at bedtime.   NON FORMULARY Diet - Regular   ondansetron (ZOFRAN) 4 MG/5ML solution Take 4 mg by mouth every 8 (eight) hours as needed for nausea or vomiting.   Phenylephrine-Cocoa Butter 0.25-85.5 % SUPP Place 1 suppository rectally 2 (two) times daily as needed.   povidone-iodine (BETADINE) 10 % external solution Apply 1 Application topically as needed for wound care.   rOPINIRole (REQUIP) 1 MG tablet Take 1 mg by mouth at bedtime.   rosuvastatin (CRESTOR) 20 MG tablet Take 20 mg by mouth every evening.   senna (SENOKOT) 8.6 MG TABS tablet Take 1 tablet by mouth at bedtime.   sodium bicarbonate 650 MG tablet Take 1 tablet (650 mg total) by mouth 2 (two) times daily.   tiZANidine (ZANAFLEX) 2 MG tablet Take  2 mg by mouth every 6 (six) hours as needed for muscle spasms (for back and sciatic pain).   zinc oxide 20 % ointment Apply 1 Application topically See admin instructions. Apply every shift to bilateral buttocks, sacrum, and coccyx   No facility-administered encounter medications on file as of 12/10/2023.     SIGNIFICANT DIAGNOSTIC EXAMS   LABS REVIEWED PREVIOUS   10-31-22: wbc 4.6; hgb 8.4; hct 26.5; mcv 94.0 plt 183; glucose 93; bun 46; creat 1.64; k+ 5.2; na++ 134; ca 8.6; gfr 31; protein 6.5 albumin 3.2  11-05-22: glucose 117; bun 35; creat 1.25; k+ 4.3;na++ 137; ca 7.5; gfr 43; hgb A1c 8.5; vitamin D 26.12; chol 149; ldl 73 trig 166; hdl 43  01-25-23: wbc 4.5; hgb 8.8; hct 27.0; mcv 94.7 plt 178; glucose 163; bun 45; creat 1.58; k+ 5.8; na++ 134; ca 8.7; gfr 32; protein 6.6 albumin 3.4 iron 54; tibc 305; vitamin B12: 496; keppra 80.7 (normal 10-40) 01-26-23: glucose 116; bun 41; creat 1.54; k+ 5.5; na++ 133; ca 8.5 gfr 33   02-08-23: keppra 41.6 (10-40) 02-18-23: hgb A1c 7.5; tsh 5.113 02-19-23: wbc 6.0; hgb 7.9; hct 24.7; mcv 95.7 plt 202; glucose 225; bun 45; creat 1.76; k+ 6.4; na++ 130; ca 8.7; gfr 28; (repeat k+ 5.4) 02-20-23: wbc 6.2; hgb 7.5; hct 24.1; mcv 95.6 plt 186;glucose 195; bun 45; creat 1.76; k+ 5.6; na++ 134; ca 8.5 gfr 28; protein 6.2; albumin 3.1 free t4: 0.80; mag 1.8 02-21-23: vitamin B12 781; folate 26.1; iron 26; tibc 266; ferritin 139 03-23-23: wbc 6.3; hgb 10.0; hct 31.0; mcv 92.8 plt 186; glucose 239; bun 41; creat 1.35; k+ 4.6; na++ 135; ca 8.7; gfr 39; protein 7.6 albumin 3.6  04-05-23: glucose 215; bun 49; creat 1.88; k+ 4.3;  na++ 134; ca 8.4; gfr 26 04-29-23: hgb A1c 10.2   06-24-23: chol 140; ldl 64; trig 190; hdl 38 06-28-23: urine culture: proteus mirabilis: cipro  07-26-23: glucose 39; bun 30; creat 1.54; k+ 4.7; na++ 140; ca 9.0; gfr 33 hgb A1c 7.1  09-21-23: glucose 110 bun 23; creat 1.41; k+ 4.8; na++ 130; ca 9.0; gfr 37; mag 2.0   TODAY  10-12-23: ACR 1050.    Review of Systems  Constitutional:  Negative for malaise/fatigue.  Respiratory:  Negative for cough and shortness of breath.   Cardiovascular:  Negative for chest pain, palpitations and leg swelling.  Gastrointestinal:  Negative for abdominal pain, constipation and heartburn.  Musculoskeletal:  Negative for back pain, joint pain and myalgias.  Skin: Negative.   Neurological:  Negative for dizziness.  Psychiatric/Behavioral:  The patient is not nervous/anxious.    Physical Exam Constitutional:      General: She is not in acute distress.    Appearance: She is well-developed. She is obese. She is not diaphoretic.  Neck:     Thyroid: No thyromegaly.  Cardiovascular:     Rate and Rhythm: Normal rate and regular rhythm.     Heart sounds: Normal heart sounds.  Pulmonary:     Effort: Pulmonary effort is normal. No respiratory distress.     Breath sounds: Normal breath sounds.  Abdominal:     General: Bowel sounds are normal. There is no distension.     Palpations: Abdomen is soft.     Tenderness: There is no abdominal tenderness.  Musculoskeletal:     Cervical back: Neck supple.     Right lower leg: No edema.     Left lower leg: No edema.     Comments: Limited range of motion in upper extremities Does not move lower extremities      Lymphadenopathy:     Cervical: No cervical adenopathy.  Skin:    General: Skin is warm and dry.  Neurological:     Mental Status: She is alert. Mental status is at baseline.     Comments: SLUMS 14/30   Psychiatric:        Mood and Affect: Mood normal.      ASSESSMENT/ PLAN:  TODAY  Aortic atherosclerosis Hypertension associated with stage 4 chronic kidney disease due to type 2 diabetes mellitus Diabetic peripheral neuropathy   Will continue current medications Will continue current plan of care Will continue to monitor her status   Time spent with patient: medications; dietary    Synthia Innocent NP [facility]   call  [phone]

## 2023-12-15 NOTE — Progress Notes (Unsigned)
 GI Office Note    Referring Provider: Sharee Holster, NP Primary Care Physician:  Sharee Holster, NP Primary Gastroenterologist: Hennie Duos. Marletta Lor, DO  Date:  12/16/2023  ID:  [name], DOB 23-Feb-1936, MRN [id number]   Chief Complaint   Chief Complaint  Patient presents with   Follow-up    Follow up. Still having some issues with constipation    History of Present Illness  [name] is a 88 y.o. female with a history of CKD stage IV, anemia of chronic disease, diabetes, HTN, HLD, central cord syndrome at C4 secondary to fall in May 2022, chronic constipation and left-sided abdominal pain, mild diverticulitis June 2024 presenting today for follow-up with complaint of ongoing constipation.  EGD 09/22/21: -gastritis s/p biopsy -normal duodenum -omeprazole 40 mg daily -Reglan 5 mg QID   Two ED visits recently for constipation and abdominal pain earlier this month. CT as outlined below. Treated with soap suds enema one instance. No changes to bowel regimen made, continue metamucil.   CT A/P 10/01/22: -mild fullness in right renal collecting system -changes consistent with rectal impaction   LFTs normal. Hgb 9.6, Glucose 404 on 10/01/22.    OV 10/20/22. Seen for rectal pain and abdominal pain.  Abdominal pain reported as being sharp.  Daughter reported issues since she had a fall.  Starts in the left and radiates to her left upper abdomen.  Has some nausea but no vomiting.  Reports some esophageal burning and mild dysphagia due to issues swallowing.  Patient was unsure of her frequency of bowel movements.  States poor appetite due to pain in all the likely change in food choices at SNF.  Also reported bloating and belching.  Having intermittent rectal pain.  History of spinal cord injury since fall.  Added Linzess 72 mcg daily, continue MiraLAX daily.  Advised suppository once weekly given CT evidence of infection.  Scheduled for EGD with dilation per patient's request to  evaluate her swallowing and upper abdominal pain.  Omeprazole increased to 40 mg twice daily from 20 mg twice daily.   EGD 11/19/22: -Gastritis s/p biopsy -Normal duodenum -No esophageal stenosis or stricture -Biopsies negative for H. Pylori, hyperplasia seen consistent with PPI therapy.  -Advised PPI twice daily   KUB 12/30/22: Nonobstructive bowel gas pattern, scattered stool in the colon.   Received notification from the primary NP after her last office visit he reported that her appetite has been okay and her weight has been stable since January and that she was having a daily bowel movement while being on MiraLAX once a day and Linzess once daily and she requested that we keep her on MiraLAX once daily instead of increasing to twice daily.  She also discussed discontinuing ensures given her appetite appeared to be good.   Hospitalization 6/21 for complicated UTI for which she had CT imaging that showed diverticular disease of the left colon with minimal fat stranding adjacent to the descending colon suggestive of mild diverticulitis and mild bladder wall thickening concerning for possible cystitis.  She was treated with Augmentin for her diverticulitis and was initially treated with Zosyn.  Foley exchanged and she was recommended to have cystoscopy outpatient.  Baseline hemoglobin noted to be 8 and she was given 1 unit PRBC during this hospital admission and her iron studies showed low iron which was also replaced while inpatient and was discharged on oral iron supplementation.   Patient's primary provider Synthia Innocent, NP who sees patient regularly at  SNF has reported that all staff states that patient eats the majority of all of her meals without issue and does not have any significant lack of appetite therefore they had discontinued her Ensure supplements previously.  She was recently seen at the SNF by [facility] 11/18 at which time she again reported chronic left-sided abdominal pain  and constipation and requesting to have a bowel movement.  Noted to be eating well without difficulty, had recently celebrated her birthday early.  Given further decline in renal function (CKD stage IV) she was advised to avoid NSAIDs and advised to continue sodium bicarbonate.  Given constipation with left lower quadrant abdominal pain they decided to start senna and discontinue Prilosec.  They will continue daily iron supplementation for anemia.  Last hemoglobin 10 in July 2024 and we will check another CBC.  They have began renally dosing all of her medications as well.  They also decided to discontinue Eliquis given no recent evidence of DVT and history of severe anemia with recurrent hematuria in the past.   Also discussed colonoscopy versus flexible sigmoidoscopy with PCP and she thought that prep would not be feasible for patient however if patient insisted that they would at least attempt this.  She also agreed to increase her Linzess dose to help with constipation.   A1c improved to 7.1 today.  On morning labs her glucose was noted to be 39, BUN 30, creatinine 1.54 which appears to be around her baseline.  OV November 2024.  Reported that usually make her bottom hurt.  Does have relief of abdominal pain somewhat after bowel movements.  Taking Senokot nightly.  Certain foods make constipation worse.  Denied melena or BRBPR.  Denied any upper GI symptoms although did not eat much on the day of her birthday dinner.  Scheduled for colonoscopy.  Advised Tums as needed for reflux breakthrough symptoms.  Continue Linzess 145 mcg once daily.  Continue Senokot daily for constipation management and iron 3 times weekly.  Colonoscopy January 2025: - Inadequate colon prep - Stool within the rectum and sigmoid colon - No evidence of stercoral colitis or diverticulosis on exam although limited view.  Last office visit 11/04/23 with Ermalinda Memos, PA.  Reported worsening left lower quadrant pain, having daily  bowel movements.  Also noting some nausea at times.  Sometimes has heartburn but does not want to take any medication.  Daughter believes this is happening more frequently the patient reports.  Usually having heartburn or nausea after breakfast.  She spoke with patient's nurse Chip Boer at the facility who states patient started colon prep but was unsure if she finished it after she had left for her shift.  Patient had 10 bowel movements on 2/28.  Current medications were Senokot nightly, Linzess as needed with none given the last 14 days.  Patient reported occasional pain but nothing significant on the left side.  Complains of heartburn from time to time.  Eating well.  Repeat CT ordered.  Denies continue nightly Senokot and Linzess as needed.  If stool burden noted on CT will give more aggressive bowel regimen.  PPI offered however patient declined..  CT A/P 11/05/2023: - No acute findings - Diverticulosis without diverticulitis - Large amount of stool within the rectosigmoid colon with distention of rectum, fecal impaction cannot be excluded - Cholelithiasis versus gallbladder sludge present without cholecystitis - Chronic avascular necrosis of both hips - Recommended Linzess 145 mcg daily, give Dulcolax suppository if stool impaction noted on rectal exam (  relayed these recommendations to nurse at [facility]  Today: She states she has been having more reflux during the day but also has some night time symptoms occasionally.  Believes she has been given some Mylanta in the past.  Continues to have some intermittent nausea but no vomiting.  Excited to go eat pancakes this morning.  She reports this past Saturday she had some terrible constipation, had a very hard stool that resulted in some rectal discomfort lasting for few hours.  This to be consistent with possible fecal impaction noted on prior CT scan.  Continues to have some left lower quadrant discomfort but no significant severe pain.  Current  bowel regimen per paperwork from facility:  Linzess 72 mcg needed, also has a Linzess 72 mcg every other day order Senokot once nightly Last received Linzess 145 on 3/1. (Last visit 11/04/23 reported - Linzess 72 mcg prn. Not in the last 14 days. On 2/18, Linzess 145 mcg was stopped. 2/20, Linzess 72 mcg daily was started. Then on 2/28, it was changed to prn. )  Wt Readings from Last 3 Encounters:  12/10/23 186 lb (84.4 kg)  11/04/23 184 lb (83.5 kg)  10/18/23 184 lb 11.2 oz (83.8 kg)    Current Outpatient Medications  Medication Sig Dispense Refill   acetaminophen (TYLENOL) 325 MG tablet Take 650 mg by mouth 3 (three) times daily.     AMBULATORY NON FORMULARY MEDICATION Medication Name:  Continue monthly catheter changes with 17fr catheter at skilled nursing facility. 1 application MONTHLY   amLODipine (NORVASC) 10 MG tablet Take 10 mg by mouth daily.     bisacodyl (DULCOLAX) 10 MG suppository Place 1 suppository (10 mg total) rectally as needed for moderate constipation (give after rectal exam if evidence of hard stool in retal vault). 1 suppository 0   calcium-vitamin D (OSCAL WITH D) 500-5 MG-MCG tablet Take 1 tablet by mouth 2 (two) times daily.     camphor-menthol (SARNA) lotion Apply 1 Application topically 2 (two) times daily as needed for itching.     Continuous Glucose Receiver (FREESTYLE LIBRE 2 READER) DEVI by Does not apply route.     dapagliflozin propanediol (FARXIGA) 5 MG TABS tablet Take 5 mg by mouth daily.     DULoxetine (CYMBALTA) 20 MG capsule Take 20 mg by mouth daily.     ferrous sulfate 325 (65 FE) MG EC tablet Take 1 tablet (325 mg total) by mouth daily with breakfast. 60 tablet 3   fexofenadine (ALLEGRA) 60 MG tablet Take 60 mg by mouth daily.     gabapentin (NEURONTIN) 300 MG capsule Take 300 mg by mouth 3 (three) times daily.     insulin aspart (NOVOLOG FLEXPEN) 100 UNIT/ML FlexPen Inject 10 Units into the skin 3 (three) times daily with meals. 8 am, 12 pm, and 6  pm     Insulin Pen Needle 30G X 5 MM MISC 1 Device by Does not apply route daily. 3/16"  0   ipratropium-albuterol (DUONEB) 0.5-2.5 (3) MG/3ML SOLN Take 3 mLs by nebulization every 6 (six) hours as needed (wheezing).     lamoTRIgine (LAMICTAL) 100 MG tablet Take 100 mg by mouth 2 (two) times daily.     lamoTRIgine 50 MG TBDP Take 50mg  PM in addition to 100mg  PM dose for 2 weeks then take twice daily in addition to 100mg  dosage 60 tablet 5   LANTUS SOLOSTAR 100 UNIT/ML Solostar Pen Inject 35 Units into the skin at bedtime.     levETIRAcetam (  KEPPRA) 500 MG tablet Take 500 mg by mouth 2 (two) times daily. Renal dose     linaclotide (LINZESS) 145 MCG CAPS capsule Take 1 capsule (145 mcg total) by mouth daily before breakfast. 30 capsule 3   LORazepam (ATIVAN) 2 MG/ML injection Inject 0.5 mLs (1 mg total) into the muscle every 15 (fifteen) minutes as needed. 4 mL 0   melatonin 5 MG TABS Take 5 mg by mouth at bedtime.     NON FORMULARY Diet - Regular     ondansetron (ZOFRAN) 4 MG/5ML solution Take 4 mg by mouth every 8 (eight) hours as needed for nausea or vomiting.     Phenylephrine-Cocoa Butter 0.25-85.5 % SUPP Place 1 suppository rectally 2 (two) times daily as needed.     povidone-iodine (BETADINE) 10 % external solution Apply 1 Application topically as needed for wound care.     rOPINIRole (REQUIP) 1 MG tablet Take 1 mg by mouth at bedtime.     rosuvastatin (CRESTOR) 20 MG tablet Take 20 mg by mouth every evening.     senna (SENOKOT) 8.6 MG TABS tablet Take 1 tablet by mouth at bedtime.     sodium bicarbonate 650 MG tablet Take 1 tablet (650 mg total) by mouth 2 (two) times daily.     tiZANidine (ZANAFLEX) 2 MG tablet Take 2 mg by mouth every 6 (six) hours as needed for muscle spasms (for back and sciatic pain).     zinc oxide 20 % ointment Apply 1 Application topically See admin instructions. Apply every shift to bilateral buttocks, sacrum, and coccyx     No current facility-administered  medications for this visit.    Past Medical History:  Diagnosis Date   Acute cystitis without hematuria    Adult failure to thrive    Anemia    Anxiety    Atherosclerosis of aorta (HCC)    Central cord syndrome at C4 level of cervical spinal cord, subsequent encounter (HCC)    Chronic kidney disease, stage IV (severe) (HCC)    CKD (chronic kidney disease)    stage 3   Depression    DM type 2 with diabetic peripheral neuropathy (HCC)    Dysphagia    GERD (gastroesophageal reflux disease)    Gout    High cholesterol    HTN (hypertension)    Hyponatremia    MDD (major depressive disorder)    Neck pain    Neuropathy    Quadriplegia, C1-C4 incomplete (HCC)    Radiculopathy    RLS (restless legs syndrome)    Unspecified convulsions (HCC)    Urinary retention     Past Surgical History:  Procedure Laterality Date   ABDOMINAL HYSTERECTOMY     ANTERIOR CERVICAL DECOMP/DISCECTOMY FUSION N/A 02/05/2021   Procedure: Cervical Three-Four  Anterior cervical decompression/discectomy/fusion;  Surgeon: Coletta Memos, MD;  Location: [facility] OR;  Service: Neurosurgery;  Laterality: N/A;  RM 20   APPENDECTOMY     BACK SURGERY     BALLOON DILATION N/A 11/19/2022   Procedure: BALLOON DILATION;  Surgeon: Lanelle Bal, DO;  Location: [facility]ITE;  Service: Endoscopy;  Laterality: N/A;   BIOPSY  09/22/2021   Procedure: BIOPSY;  Surgeon: Lanelle Bal, DO;  Location: [facility];  Service: Endoscopy;;   BIOPSY  11/19/2022   Procedure: BIOPSY;  Surgeon: Lanelle Bal, DO;  Location: [facility];  Service: Endoscopy;;   CERVICAL DISC SURGERY     ESOPHAGOGASTRODUODENOSCOPY (EGD) WITH PROPOFOL N/A 09/22/2021  Procedure: ESOPHAGOGASTRODUODENOSCOPY (EGD) WITH PROPOFOL;  Surgeon: Vinetta Greening, DO;  Location: [facility];  Service: Endoscopy;  Laterality: N/A;  1:00pm   ESOPHAGOGASTRODUODENOSCOPY (EGD) WITH PROPOFOL N/A 11/19/2022   Procedure: ESOPHAGOGASTRODUODENOSCOPY (EGD) WITH  PROPOFOL;  Surgeon: Vinetta Greening, DO;  Location: [facility];  Service: Endoscopy;  Laterality: N/A;  11:30 am, asa 3   FLEXIBLE SIGMOIDOSCOPY  09/20/2023   Procedure: FLEXIBLE SIGMOIDOSCOPY;  Surgeon: Vinetta Greening, DO;  Location: [facility];  Service: Endoscopy;;   HAND SURGERY     KNEE SURGERY      Family History  Problem Relation Age of Onset   Cancer Mother    Cardiomyopathy Father    Seizures Sister        childhood   Seizures Brother        in his 77s   Colon cancer Neg Hx     Allergies as of 12/16/2023 - Review Complete 12/16/2023  Allergen Reaction Noted   Ace inhibitors Other (See Comments) 08/29/2021   Codeine  03/09/2018   Sulfa antibiotics  03/25/2021    Social History   Socioeconomic History   Marital status: Widowed    Spouse name: Not on file   Number of children: Not on file   Years of education: Not on file   Highest education level: Not on file  Occupational History   Not on file  Tobacco Use   Smoking status: Never   Smokeless tobacco: Never  Vaping Use   Vaping status: Never Used  Substance and Sexual Activity   Alcohol use: Never   Drug use: Never   Sexual activity: Not Currently  Other Topics Concern   Not on file  Social History Narrative   Lives at [address] [facility]   Social Drivers of Health   Financial Resource Strain: Not on file  Food Insecurity: No Food Insecurity (02/20/2023)   Hunger Vital Sign    Worried About Running Out of Food in the Last Year: Never true    Ran Out of Food in the Last Year: Never true  Transportation Needs: No Transportation Needs (02/20/2023)   PRAPARE - Administrator, Civil Service (Medical): No    Lack of Transportation (Non-Medical): No  Physical Activity: Not on file  Stress: Not on file  Social Connections: Not on file     Review of Systems   Gen: Denies fever, chills, anorexia. Denies fatigue, weakness, weight loss.  CV: Denies chest pain, palpitations, syncope,  peripheral edema, and claudication. Resp: Denies dyspnea at rest, cough, wheezing, coughing up blood, and pleurisy. GI: See HPI Derm: Denies rash, itching, dry skin Psych: Denies depression, anxiety, memory loss, confusion. No homicidal or suicidal ideation.  Heme: Denies bruising, bleeding, and enlarged lymph nodes.  Physical Exam   BP 128/82 (BP Location: Left Arm, Patient Position: Sitting, Cuff Size: Large)   Pulse 60   Temp 98.1 F (36.7 C) (Temporal)   General:   Alert and oriented. No distress noted. Pleasant and cooperative.  Head:  Normocephalic and atraumatic. Eyes:  Conjuctiva clear without scleral icterus. Mouth:  Oral mucosa pink and moist. Good dentition. No lesions. Abdomen:  +BS, soft, non-distended. Ttp to periumbilical region, LUQ and LLQ. No rebound or guarding. No HSM or masses noted. Rectal: deferred Msk:  Symmetrical without gross deformities. Normal posture. Extremities:  Without edema. Neurologic:  Alert and  oriented x4 Psych:  Alert and cooperative. Normal mood and affect.  Assessment  [name]  is a 88 y.o. female with a history of CKD stage IV, anemia of chronic disease, diabetes, HTN, HLD, central cord syndrome at C4 secondary to fall in May 2022, chronic constipation and left-sided abdominal pain, mild diverticulitis June 2024 presenting today for follow-up with complaint of ongoing constipation.  Constipation, LLQ pain, history of diverticulitis: - Attempted colonoscopy unsuccessful -Colonoscopy likely secondary to lack of mobility as well as central cord syndrome and IBS - Despite intermittent diarrhea, bowel regimen remains an adequate, recent CT scan with concern for fecal impaction -Currently on Linzess 72 mcg every other day, will increase to daily and will increase back up to 145 mcg daily if needed, this was stopped given concerns for ongoing diarrhea (this may last 1-2 weeks) - In addition to making Linzess daily, will increase Senokot  nightly and do Dulcolax suppository once weekly to help with rectal stimulation and help to prevent fecal impaction -Left lower quadrant pain likely secondary to chronic constipation and IBS -Does have history of diverticulitis in 2024.  Likely will not be able to tolerate repeat colonoscopy. -Will continue to work toward more effective bowel regimen.  Patient requested milk of magnesia as needed, will only give if no bowel movement in 3 days.  GERD, nausea: - History of reflux, currently off PPI.  There was concerns that omeprazole was making constipation worse previously -Continues to have intermittent heartburn symptoms.  States she has been given Mylanta before in the past. -Will start famotidine 20 mg daily, can increase to 40 daily if needed -May consider resuming different PPI if no improvement with famotidine at next visit. -Nausea may be secondary to reflux as well as possibly from constipation  PLAN   Linzess 72 mcg once daily, will increase back up to 145 mcg daily if needed Senokot 2 tablets nightly Famotidine 20 mg once daily  GERD diet Dulcolax suppository once weekly.  Milk of magnesia if no BM in 3 days, hold for diarrhea Follow up in 3 months.     Julian Obey, MSN, FNP-BC, AGACNP-BC [facility]

## 2023-12-16 ENCOUNTER — Ambulatory Visit (INDEPENDENT_AMBULATORY_CARE_PROVIDER_SITE_OTHER): Admitting: Gastroenterology

## 2023-12-16 ENCOUNTER — Encounter: Payer: Self-pay | Admitting: Gastroenterology

## 2023-12-16 VITALS — BP 128/82 | HR 60 | Temp 98.1°F

## 2023-12-16 DIAGNOSIS — R1032 Left lower quadrant pain: Secondary | ICD-10-CM

## 2023-12-16 DIAGNOSIS — K219 Gastro-esophageal reflux disease without esophagitis: Secondary | ICD-10-CM | POA: Diagnosis not present

## 2023-12-16 DIAGNOSIS — K5900 Constipation, unspecified: Secondary | ICD-10-CM

## 2023-12-16 DIAGNOSIS — Z8719 Personal history of other diseases of the digestive system: Secondary | ICD-10-CM | POA: Diagnosis not present

## 2023-12-16 DIAGNOSIS — R110 Nausea: Secondary | ICD-10-CM | POA: Diagnosis not present

## 2023-12-16 NOTE — Patient Instructions (Addendum)
 We are changing of your bowel regimen: - Increase Linzess to 72 mcg once daily.  You should suspect diarrhea for 1-2 weeks and then this should improve. -Discontinue Linzess 145 mcg and 72 mcg as needed -Increase Senokot to 2 tablets nightly -Give Dulcolax suppository once weekly -Milk of magnesia as needed if no bowel movement in 3 days  For acid reflux/heartburn: -Start famotidine 20 mg once daily, 30 minutes prior to breakfast -Tums as needed for breakthrough reflux symptoms/indigestion  What to expect while on Linzess: Constipation relief is typically felt in about 1 week Relief of abdominal pain, discomfort, and bloating begins in about 1 week with symptoms typically improving over 12 weeks and beyond. Diarrhea is most common side effect and typically begins within the first 2 weeks and can take 3-4 weeks to resolve It would be helpful to begin treatment over the weekend or when you can be closer to a bathroom   Follow a GERD diet:  Avoid fried, fatty, greasy, spicy, citrus foods. Avoid caffeine and carbonated beverages. Avoid chocolate. Try eating 4-6 small meals a day rather than 3 large meals. Do not eat within 3 hours of laying down. Prop head of bed up on wood or bricks to create a 6 inch incline.  Follow-up in 3 months  Julian Obey, MSN, APRN, FNP-BC, AGACNP-BC [facility]

## 2023-12-22 ENCOUNTER — Encounter: Payer: Self-pay | Admitting: Adult Health

## 2023-12-22 ENCOUNTER — Non-Acute Institutional Stay (SKILLED_NURSING_FACILITY): Payer: Self-pay | Admitting: Adult Health

## 2023-12-22 DIAGNOSIS — E1122 Type 2 diabetes mellitus with diabetic chronic kidney disease: Secondary | ICD-10-CM

## 2023-12-22 DIAGNOSIS — R29818 Other symptoms and signs involving the nervous system: Secondary | ICD-10-CM

## 2023-12-22 DIAGNOSIS — G2581 Restless legs syndrome: Secondary | ICD-10-CM | POA: Diagnosis not present

## 2023-12-22 DIAGNOSIS — N184 Chronic kidney disease, stage 4 (severe): Secondary | ICD-10-CM | POA: Diagnosis not present

## 2023-12-22 DIAGNOSIS — I129 Hypertensive chronic kidney disease with stage 1 through stage 4 chronic kidney disease, or unspecified chronic kidney disease: Secondary | ICD-10-CM

## 2023-12-22 DIAGNOSIS — R4189 Other symptoms and signs involving cognitive functions and awareness: Secondary | ICD-10-CM | POA: Diagnosis not present

## 2023-12-22 NOTE — Progress Notes (Signed)
 Location:  [facility] Room Number: [id number] Place of Service:  SNF (31)   CODE STATUS: dnr   Allergies  Allergen Reactions   Ace Inhibitors Other (See Comments)    Hyperkalemia    Codeine     Unknown reaction   Sulfa Antibiotics     Unknown reaction    Chief Complaint  Patient presents with   Medical Management of Chronic Issues        Neurocognitive deficits:  Restless leg syndrome:  Hypertension associated with stage 4 chronic kidney disease due to type 2 diabetes mellitus     HPI:  She is a 88 year old long term resident of this facility being seen for the management of her chronic illnesses:  Neurocognitive deficits:  Restless leg syndrome:  Hypertension associated with stage 4 chronic kidney disease due to type 2 diabetes mellitus. There are no reports of uncontrolled pain. She does get out of bed at times. There are no reports of anxiety or depressive thoughts.   Past Medical History:  Diagnosis Date   Acute cystitis without hematuria    Adult failure to thrive    Anemia    Anxiety    Atherosclerosis of aorta (HCC)    Central cord syndrome at C4 level of cervical spinal cord, subsequent encounter (HCC)    Chronic kidney disease, stage IV (severe) (HCC)    CKD (chronic kidney disease)    stage 3   Depression    DM type 2 with diabetic peripheral neuropathy (HCC)    Dysphagia    GERD (gastroesophageal reflux disease)    Gout    High cholesterol    HTN (hypertension)    Hyponatremia    MDD (major depressive disorder)    Neck pain    Neuropathy    Quadriplegia, C1-C4 incomplete (HCC)    Radiculopathy    RLS (restless legs syndrome)    Unspecified convulsions (HCC)    Urinary retention     Past Surgical History:  Procedure Laterality Date   ABDOMINAL HYSTERECTOMY     ANTERIOR CERVICAL DECOMP/DISCECTOMY FUSION N/A 02/05/2021   Procedure: Cervical Three-Four  Anterior cervical decompression/discectomy/fusion;  Surgeon: Audie Bleacher, MD;   Location: [facility] OR;  Service: Neurosurgery;  Laterality: N/A;  RM 20   APPENDECTOMY     BACK SURGERY     BALLOON DILATION N/A 11/19/2022   Procedure: BALLOON DILATION;  Surgeon: Vinetta Greening, DO;  [facility]: [facility];  Service: Endoscopy;  Laterality: N/A;   BIOPSY  09/22/2021   Procedure: BIOPSY;  Surgeon: Vinetta Greening, DO;  [facility];  Service: Endoscopy;;   BIOPSY  11/19/2022   Procedure: BIOPSY;  Surgeon: Vinetta Greening, DO;  [facility];  Service: Endoscopy;;   CERVICAL DISC SURGERY     ESOPHAGOGASTRODUODENOSCOPY (EGD) WITH PROPOFOL  N/A 09/22/2021   Procedure: ESOPHAGOGASTRODUODENOSCOPY (EGD) WITH PROPOFOL ;  Surgeon: Vinetta Greening, DO;  [facility];  Service: Endoscopy;  Laterality: N/A;  1:00pm   ESOPHAGOGASTRODUODENOSCOPY (EGD) WITH PROPOFOL  N/A 11/19/2022   Procedure: ESOPHAGOGASTRODUODENOSCOPY (EGD) WITH PROPOFOL ;  Surgeon: Vinetta Greening, DO;  [facility];  Service: Endoscopy;  Laterality: N/A;  11:30 am, asa 3   FLEXIBLE SIGMOIDOSCOPY  09/20/2023   Procedure: FLEXIBLE SIGMOIDOSCOPY;  Surgeon: Vinetta Greening, DO;  [facility];  Service: Endoscopy;;   HAND SURGERY     KNEE SURGERY      Social History   Socioeconomic History  Marital status: Widowed    Spouse name: Not on file   Number of children: Not on file   Years of education: Not on file   Highest education level: Not on file  Occupational History   Not on file  Tobacco Use   Smoking status: Never   Smokeless tobacco: Never  Vaping Use   Vaping status: Never Used  Substance and Sexual Activity   Alcohol use: Never   Drug use: Never   Sexual activity: Not Currently  Other Topics Concern   Not on file  Social History Narrative   Lives at [facility]   Social Drivers of Health   Financial Resource Strain: Not on file  Food Insecurity: No Food Insecurity (02/20/2023)   Hunger Vital Sign    Worried About Running Out of  Food in the Last Year: Never true    Ran Out of Food in the Last Year: Never true  Transportation Needs: No Transportation Needs (02/20/2023)   PRAPARE - Administrator, Civil Service (Medical): No    Lack of Transportation (Non-Medical): No  Physical Activity: Not on file  Stress: Not on file  Social Connections: Not on file  Intimate Partner Violence: Not At Risk (02/20/2023)   Humiliation, Afraid, Rape, and Kick questionnaire    Fear of Current or Ex-Partner: No    Emotionally Abused: No    Physically Abused: No    Sexually Abused: No   Family History  Problem Relation Age of Onset   Cancer Mother    Cardiomyopathy Father    Seizures Sister        childhood   Seizures Brother        in his 30s   Colon cancer Neg Hx       VITAL SIGNS BP 120/60   Pulse 70   Temp 98.7 F (37.1 C)   Resp 18   Ht 5\' 2"  (1.575 m)   Wt 186 lb (84.4 kg)   SpO2 95%   BMI 34.02 kg/m   Outpatient Encounter Medications as of 12/22/2023  Medication Sig   acetaminophen  (TYLENOL ) 325 MG tablet Take 650 mg by mouth 3 (three) times daily.   AMBULATORY NON FORMULARY MEDICATION Medication Name:  Continue monthly catheter changes with 47fr catheter at skilled nursing facility.   amLODipine (NORVASC) 10 MG tablet Take 10 mg by mouth daily.   bisacodyl  (DULCOLAX) 10 MG suppository Place 1 suppository (10 mg total) rectally as needed for moderate constipation (give after rectal exam if evidence of hard stool in retal vault).   calcium -vitamin D  (OSCAL WITH D) 500-5 MG-MCG tablet Take 1 tablet by mouth 2 (two) times daily.   camphor-menthol (SARNA) lotion Apply 1 Application topically 2 (two) times daily as needed for itching.   Continuous Glucose Receiver (FREESTYLE LIBRE 2 READER) DEVI by Does not apply route.   dapagliflozin  propanediol (FARXIGA ) 5 MG TABS tablet Take 5 mg by mouth daily.   DULoxetine  (CYMBALTA ) 20 MG capsule Take 20 mg by mouth daily.   ferrous sulfate  325 (65 FE) MG EC  tablet Take 1 tablet (325 mg total) by mouth daily with breakfast.   fexofenadine (ALLEGRA) 60 MG tablet Take 60 mg by mouth daily.   gabapentin  (NEURONTIN ) 300 MG capsule Take 300 mg by mouth 3 (three) times daily.   insulin  aspart (NOVOLOG  FLEXPEN) 100 UNIT/ML FlexPen Inject 10 Units into the skin 3 (three) times daily with meals. 8 am, 12 pm, and 6 pm  Insulin  Pen Needle 30G X 5 MM MISC 1 Device by Does not apply route daily. 3/16"   ipratropium-albuterol  (DUONEB) 0.5-2.5 (3) MG/3ML SOLN Take 3 mLs by nebulization every 6 (six) hours as needed (wheezing).   lamoTRIgine  (LAMICTAL ) 100 MG tablet Take 100 mg by mouth 2 (two) times daily.   lamoTRIgine  50 MG TBDP Take 50mg  PM in addition to 100mg  PM dose for 2 weeks then take twice daily in addition to 100mg  dosage   LANTUS  SOLOSTAR 100 UNIT/ML Solostar Pen Inject 35 Units into the skin at bedtime.   levETIRAcetam  (KEPPRA ) 500 MG tablet Take 500 mg by mouth 2 (two) times daily. Renal dose   linaclotide  (LINZESS ) 145 MCG CAPS capsule Take 1 capsule (145 mcg total) by mouth daily before breakfast.   LORazepam  (ATIVAN ) 2 MG/ML injection Inject 0.5 mLs (1 mg total) into the muscle every 15 (fifteen) minutes as needed.   melatonin 5 MG TABS Take 5 mg by mouth at bedtime.   NON FORMULARY Diet - Regular   ondansetron  (ZOFRAN ) 4 MG/5ML solution Take 4 mg by mouth every 8 (eight) hours as needed for nausea or vomiting.   Phenylephrine -Cocoa Butter 0.25-85.5 % SUPP Place 1 suppository rectally 2 (two) times daily as needed.   povidone-iodine (BETADINE) 10 % external solution Apply 1 Application topically as needed for wound care.   rOPINIRole  (REQUIP ) 1 MG tablet Take 1 mg by mouth at bedtime.   rosuvastatin  (CRESTOR ) 20 MG tablet Take 20 mg by mouth every evening.   senna (SENOKOT) 8.6 MG TABS tablet Take 1 tablet by mouth at bedtime.   sodium bicarbonate  650 MG tablet Take 1 tablet (650 mg total) by mouth 2 (two) times daily.   tiZANidine  (ZANAFLEX ) 2  MG tablet Take 2 mg by mouth every 6 (six) hours as needed for muscle spasms (for back and sciatic pain).   zinc oxide 20 % ointment Apply 1 Application topically See admin instructions. Apply every shift to bilateral buttocks, sacrum, and coccyx   No facility-administered encounter medications on file as of 12/22/2023.     SIGNIFICANT DIAGNOSTIC EXAMS   LABS REVIEWED PREVIOUS    01-25-23: wbc 4.5; hgb 8.8; hct 27.0; mcv 94.7 plt 178; glucose 163; bun 45; creat 1.58; k+ 5.8; na++ 134; ca 8.7; gfr 32; protein 6.6 albumin  3.4 iron 54; tibc 305; vitamin B12: 496; keppra  80.7 (normal 10-40) 01-26-23: glucose 116; bun 41; creat 1.54; k+ 5.5; na++ 133; ca 8.5 gfr 33   02-08-23: keppra  41.6 (10-40) 02-18-23: hgb A1c 7.5; tsh 5.113 02-19-23: wbc 6.0; hgb 7.9; hct 24.7; mcv 95.7 plt 202; glucose 225; bun 45; creat 1.76; k+ 6.4; na++ 130; ca 8.7; gfr 28; (repeat k+ 5.4) 02-20-23: wbc 6.2; hgb 7.5; hct 24.1; mcv 95.6 plt 186;glucose 195; bun 45; creat 1.76; k+ 5.6; na++ 134; ca 8.5 gfr 28; protein 6.2; albumin  3.1 free t4: 0.80; mag 1.8 02-21-23: vitamin B12 781; folate 26.1; iron 26; tibc 266; ferritin 139 03-23-23: wbc 6.3; hgb 10.0; hct 31.0; mcv 92.8 plt 186; glucose 239; bun 41; creat 1.35; k+ 4.6; na++ 135; ca 8.7; gfr 39; protein 7.6 albumin  3.6  04-05-23: glucose 215; bun 49; creat 1.88; k+ 4.3; na++ 134; ca 8.4; gfr 26 04-29-23: hgb A1c 10.2   06-24-23: chol 140; ldl 64; trig 190; hdl 38 06-28-23: urine culture: proteus mirabilis: cipro  07-26-23: glucose 39; bun 30; creat 1.54; k+ 4.7; na++ 140; ca 9.0; gfr 33 hgb A1c 7.1  09-21-23: glucose 110 bun 23;  creat 1.41; k+ 4.8; na++ 130; ca 9.0; gfr 37; mag 2.0   TODAY  10-12-23: ACR 1050. 11-09-23: glucose 93; bun 27; creat 1.83; k+ 4.2; na++ 137; ca 9.2; gfr 27 11-22-23: wbc 4.6; hgb 8.5; hct 26.2; mcv 95.3 plt 216; glucose 169; bun 39; creat 1.58; k+ 4.5; na++ 135; ca 9.3 gfr 32; protein 6.8 albumin  3.3; hgb A1c urine microalbumin 966.9   Review of  Systems  Constitutional:  Negative for malaise/fatigue.  Respiratory:  Negative for cough and shortness of breath.   Cardiovascular:  Negative for chest pain, palpitations and leg swelling.  Gastrointestinal:  Negative for abdominal pain, constipation and heartburn.  Musculoskeletal:  Negative for back pain, joint pain and myalgias.  Skin: Negative.   Neurological:  Negative for dizziness.  Psychiatric/Behavioral:  The patient is not nervous/anxious.    Physical Exam Constitutional:      General: She is not in acute distress.    Appearance: She is well-developed. She is obese. She is not diaphoretic.  Neck:     Thyroid : No thyromegaly.  Cardiovascular:     Rate and Rhythm: Normal rate and regular rhythm.     Pulses: Normal pulses.     Heart sounds: Normal heart sounds.  Pulmonary:     Effort: Pulmonary effort is normal. No respiratory distress.     Breath sounds: Normal breath sounds.  Abdominal:     General: Bowel sounds are normal. There is no distension.     Palpations: Abdomen is soft.     Tenderness: There is no abdominal tenderness.  Genitourinary:    Comments: foley Musculoskeletal:     Cervical back: Neck supple.     Right lower leg: No edema.     Left lower leg: No edema.     Comments:  Limited range of motion in upper extremities Does not move lower extremities       Lymphadenopathy:     Cervical: No cervical adenopathy.  Skin:    General: Skin is warm and dry.  Neurological:     Mental Status: She is alert. Mental status is at baseline.     Comments: SLUMS 14/30   Psychiatric:        Mood and Affect: Mood normal.         ASSESSMENT/ PLAN:  TODAY   Neurocognitive deficits: 03-22-23 SLUMS 14/30  2. Restless leg syndrome: will continue requip  1 mg nightly  3. Hypertension associated with stage 4 chronic kidney disease due to type 2 diabetes mellitus: b/p 120/60 will continue norvasc 10 mg daily   PREVIOUS   4. Seizure; will continue keppra  500 mg  twice daily did not tolerate lower dose. Will continue lamictal  100 mg twice daily  neurology is aware  5. Hyperlipidemia associated with type 2 diabetes mellitus: ldl 73 will continue crestor  20 mg daily will check lipids  6. Post menopausal osteoporosis: t score -3.391   7. GERD without esophagitis: is off medications    8. Chronic constipation: will continue miralax  daily and linzess  145 mcg daily as needed  9. 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 Lumbar radicular syndrome/avascular necrosis bilateral hips; will continue tylenol  650 mg every 8 hours; gabapentin  300 mg three times daily and cymbalta  20 mg daily   11. Diabetic peripheral neuropathy:  will continue gabapentin  300 mg three times daily   12. CKD stage 4 due to type 2 diabetes mellitus: bun 39; creat 1.58 gfr 32   13. Anemia associated with type 2 diabetes mellitus due to underlying condition: hgb 8.5 will monitor   14. Urine retention/neurogenic bladder: has long term foley; is followed by urology  15. Chronic anxiety: is off medications.   16. Protein calorie malnutrition severe: albumin  3.3; protein 6.8; is presently off supplements  17. Aortic atherosclerosis (ct 01-26-21) is on asa and statin  18. Central cord syndrome at C4 level of cervical spine compression subsequent encounter/code compression quadriplegia C1-4 (01-25-21) asa 81 mg daily zanaflex  2 mg every 6 hours as needed for spasticity.   Britt Candle NP [facility]  call [phone]

## 2024-01-19 ENCOUNTER — Encounter: Payer: Self-pay | Admitting: Adult Health

## 2024-01-19 ENCOUNTER — Non-Acute Institutional Stay (SKILLED_NURSING_FACILITY): Payer: Self-pay | Admitting: Adult Health

## 2024-01-19 DIAGNOSIS — R569 Unspecified convulsions: Secondary | ICD-10-CM

## 2024-01-19 DIAGNOSIS — E1169 Type 2 diabetes mellitus with other specified complication: Secondary | ICD-10-CM

## 2024-01-19 DIAGNOSIS — E785 Hyperlipidemia, unspecified: Secondary | ICD-10-CM

## 2024-01-19 DIAGNOSIS — M810 Age-related osteoporosis without current pathological fracture: Secondary | ICD-10-CM | POA: Diagnosis not present

## 2024-01-19 NOTE — Progress Notes (Unsigned)
 Location:  [facility] Room Number: 154-W Place of Service:  SNF (31) Provider: Britt Candle, NP  CODE STATUS: DNR  Allergies  Allergen Reactions   Ace Inhibitors Other (See Comments)    Hyperkalemia    Codeine     Unknown reaction   Sulfa Antibiotics     Unknown reaction    Chief Complaint  Patient presents with   Medical Management of Chronic Issues    Discuss the need for Covid Booster.    HPI:    Past Medical History:  Diagnosis Date   Acute cystitis without hematuria    Adult failure to thrive    Anemia    Anxiety    Atherosclerosis of aorta (HCC)    Central cord syndrome at C4 level of cervical spinal cord, subsequent encounter (HCC)    Chronic kidney disease, stage IV (severe) (HCC)    CKD (chronic kidney disease)    stage 3   Depression    DM type 2 with diabetic peripheral neuropathy (HCC)    Dysphagia    GERD (gastroesophageal reflux disease)    Gout    High cholesterol    HTN (hypertension)    Hyponatremia    MDD (major depressive disorder)    Neck pain    Neuropathy    Quadriplegia, C1-C4 incomplete (HCC)    Radiculopathy    RLS (restless legs syndrome)    Unspecified convulsions (HCC)    Urinary retention     Past Surgical History:  Procedure Laterality Date   ABDOMINAL HYSTERECTOMY     ANTERIOR CERVICAL DECOMP/DISCECTOMY FUSION N/A 02/05/2021   Procedure: Cervical Three-Four  Anterior cervical decompression/discectomy/fusion;  Surgeon: Audie Bleacher, MD;  Location: [facility] OR;  Service: Neurosurgery;  Laterality: N/A;  RM 20   APPENDECTOMY     BACK SURGERY     BALLOON DILATION N/A 11/19/2022   Procedure: BALLOON DILATION;  Surgeon: Vinetta Greening, DO;  [facility];  Service: Endoscopy;  Laterality: N/A;   BIOPSY  09/22/2021   Procedure: BIOPSY;  Surgeon: Vinetta Greening, DO;  [facility];  Service: Endoscopy;;   BIOPSY  11/19/2022   Procedure: BIOPSY;  Surgeon: Vinetta Greening, DO;   [facility];  Service: Endoscopy;;   CERVICAL DISC SURGERY     ESOPHAGOGASTRODUODENOSCOPY (EGD) WITH PROPOFOL  N/A 09/22/2021   Procedure: ESOPHAGOGASTRODUODENOSCOPY (EGD) WITH PROPOFOL ;  Surgeon: Vinetta Greening, DO;  [facility];  Service: Endoscopy;  Laterality: N/A;  1:00pm   ESOPHAGOGASTRODUODENOSCOPY (EGD) WITH PROPOFOL  N/A 11/19/2022   Procedure: ESOPHAGOGASTRODUODENOSCOPY (EGD) WITH PROPOFOL ;  Surgeon: Vinetta Greening, DO;  [facility];  Service: Endoscopy;  Laterality: N/A;  11:30 am, asa 3   FLEXIBLE SIGMOIDOSCOPY  09/20/2023   Procedure: FLEXIBLE SIGMOIDOSCOPY;  Surgeon: Vinetta Greening, DO;  [facility];  Service: Endoscopy;;   HAND SURGERY     KNEE SURGERY      Social History   Socioeconomic History   Marital status: Widowed    Spouse name: Not on file   Number of children: Not on file   Years of education: Not on file   Highest education level: Not on file  Occupational History   Not on file  Tobacco Use   Smoking status: Never   Smokeless tobacco: Never  Vaping Use   Vaping status: Never Used  Substance and Sexual Activity   Alcohol use: Never   Drug use: Never   Sexual activity:  Not Currently  Other Topics Concern   Not on file  Social History Narrative   Lives at [facility]   Social Drivers of Health   Financial Resource Strain: Not on file  Food Insecurity: No Food Insecurity (02/20/2023)   Hunger Vital Sign    Worried About Running Out of Food in the Last Year: Never true    Ran Out of Food in the Last Year: Never true  Transportation Needs: No Transportation Needs (02/20/2023)   PRAPARE - Administrator, Civil Service (Medical): No    Lack of Transportation (Non-Medical): No  Physical Activity: Not on file  Stress: Not on file  Social Connections: Not on file  Intimate Partner Violence: Not At Risk (02/20/2023)   Humiliation, Afraid, Rape, and Kick questionnaire    Fear of  Current or Ex-Partner: No    Emotionally Abused: No    Physically Abused: No    Sexually Abused: No   Family History  Problem Relation Age of Onset   Cancer Mother    Cardiomyopathy Father    Seizures Sister        childhood   Seizures Brother        in his 79s   Colon cancer Neg Hx       VITAL SIGNS BP (!) 145/84   Pulse 63   Temp (!) 97.4 F (36.3 C)   Resp 20   Ht 5\' 2"  (1.575 m)   Wt 159 lb 3.2 oz (72.2 kg)   SpO2 98%   BMI 29.12 kg/m   Outpatient Encounter Medications as of 01/19/2024  Medication Sig   acetaminophen  (TYLENOL ) 325 MG tablet Take 650 mg by mouth 3 (three) times daily.   AMBULATORY NON FORMULARY MEDICATION Medication Name:  Continue monthly catheter changes with 15fr catheter at skilled nursing facility.   amLODipine (NORVASC) 10 MG tablet Take 10 mg by mouth daily.   bisacodyl  (DULCOLAX) 10 MG suppository Place 10 mg rectally once a week. Thursday   Calcium  Carbonate Antacid (TUMS PO) Take 200 mg by mouth 2 (two) times daily as needed.   calcium -vitamin D  (OSCAL WITH D) 500-5 MG-MCG tablet Take 1 tablet by mouth 2 (two) times daily.   camphor-menthol (SARNA) lotion Apply 1 Application topically 2 (two) times daily as needed for itching.   Continuous Glucose Receiver (FREESTYLE LIBRE 2 READER) DEVI by Does not apply route. Friday   dapagliflozin  propanediol (FARXIGA ) 5 MG TABS tablet Take 5 mg by mouth daily.   DULoxetine  (CYMBALTA ) 20 MG capsule Take 20 mg by mouth daily.   famotidine (PEPCID) 20 MG tablet Take 20 mg by mouth in the morning.   ferrous sulfate  325 (65 FE) MG EC tablet Take 325 mg by mouth 3 (three) times a week. Monday, Wednesday, and Friday.   gabapentin  (NEURONTIN ) 300 MG capsule Take 300 mg by mouth 3 (three) times daily.   insulin  aspart (NOVOLOG  FLEXPEN) 100 UNIT/ML FlexPen Inject 5 Units into the skin 3 (three) times daily with meals. 8 am, 12 pm, and 6 pm   Insulin  Pen Needle 30G X 5 MM MISC 1 Device by Does not apply route  daily. 3/16"   ipratropium-albuterol  (DUONEB) 0.5-2.5 (3) MG/3ML SOLN Take 3 mLs by nebulization every 6 (six) hours as needed (wheezing).   lamoTRIgine  (LAMICTAL ) 150 MG tablet Take 150 mg by mouth 2 (two) times daily.   LANTUS  SOLOSTAR 100 UNIT/ML Solostar Pen Inject 25 Units into the skin at bedtime.   levETIRAcetam  (  KEPPRA ) 500 MG tablet Take 500 mg by mouth 2 (two) times daily. Renal dose   linaclotide  (LINZESS ) 72 MCG capsule Take 72 mcg by mouth daily.   loratadine (CLARITIN) 10 MG tablet Take 10 mg by mouth daily.   LORazepam  (ATIVAN ) 2 MG/ML injection Inject 0.5 mLs (1 mg total) into the muscle every 15 (fifteen) minutes as needed.   Magnesium  Hydroxide (MILK OF MAGNESIA PO) Take 30 mLs by mouth as needed.   melatonin 5 MG TABS Take 5 mg by mouth at bedtime.   ondansetron  (ZOFRAN ) 4 MG/5ML solution Take 4 mg by mouth every 8 (eight) hours as needed for nausea or vomiting.   Phenylephrine -Cocoa Butter 0.25-85.5 % SUPP Place 1 suppository rectally 2 (two) times daily as needed.   rOPINIRole  (REQUIP ) 1 MG tablet Take 1 mg by mouth at bedtime.   rosuvastatin  (CRESTOR ) 20 MG tablet Take 20 mg by mouth at bedtime.   senna (SENOKOT) 8.6 MG TABS tablet Take 1 tablet by mouth at bedtime.   sodium bicarbonate  650 MG tablet Take 1 tablet (650 mg total) by mouth 2 (two) times daily.   bisacodyl  (DULCOLAX) 10 MG suppository Place 1 suppository (10 mg total) rectally as needed for moderate constipation (give after rectal exam if evidence of hard stool in retal vault). (Patient not taking: Reported on 01/19/2024)   ferrous sulfate  325 (65 FE) MG EC tablet Take 1 tablet (325 mg total) by mouth daily with breakfast. (Patient not taking: Reported on 01/19/2024)   fexofenadine (ALLEGRA) 60 MG tablet Take 60 mg by mouth daily. (Patient not taking: Reported on 01/19/2024)   lamoTRIgine  (LAMICTAL ) 100 MG tablet Take 100 mg by mouth 2 (two) times daily.   lamoTRIgine  50 MG TBDP Take 50mg  PM in addition to 100mg   PM dose for 2 weeks then take twice daily in addition to 100mg  dosage (Patient not taking: Reported on 01/19/2024)   linaclotide  (LINZESS ) 145 MCG CAPS capsule Take 1 capsule (145 mcg total) by mouth daily before breakfast. (Patient not taking: Reported on 01/19/2024)   NON FORMULARY Diet - Regular (Patient not taking: Reported on 01/19/2024)   povidone-iodine (BETADINE) 10 % external solution Apply 1 Application topically as needed for wound care. (Patient not taking: Reported on 01/19/2024)   tiZANidine  (ZANAFLEX ) 2 MG tablet Take 2 mg by mouth every 6 (six) hours as needed for muscle spasms (for back and sciatic pain). (Patient not taking: Reported on 01/19/2024)   zinc oxide 20 % ointment Apply 1 Application topically See admin instructions. Apply every shift to bilateral buttocks, sacrum, and coccyx (Patient not taking: Reported on 01/19/2024)   No facility-administered encounter medications on file as of 01/19/2024.     SIGNIFICANT DIAGNOSTIC EXAMS       ASSESSMENT/ PLAN:     Britt Candle NP [facility]  Contact [phone] Monday through Friday 8am- 5pm  After hours call [phone]

## 2024-01-26 DIAGNOSIS — E1140 Type 2 diabetes mellitus with diabetic neuropathy, unspecified: Secondary | ICD-10-CM | POA: Diagnosis not present

## 2024-01-26 DIAGNOSIS — L602 Onychogryphosis: Secondary | ICD-10-CM | POA: Diagnosis not present

## 2024-01-26 LAB — HM DIABETES FOOT EXAM: HM Diabetic Foot Exam: 360

## 2024-02-14 DIAGNOSIS — M810 Age-related osteoporosis without current pathological fracture: Secondary | ICD-10-CM | POA: Diagnosis not present

## 2024-02-14 DIAGNOSIS — R498 Other voice and resonance disorders: Secondary | ICD-10-CM | POA: Diagnosis not present

## 2024-02-14 DIAGNOSIS — R488 Other symbolic dysfunctions: Secondary | ICD-10-CM | POA: Diagnosis not present

## 2024-02-15 DIAGNOSIS — M810 Age-related osteoporosis without current pathological fracture: Secondary | ICD-10-CM | POA: Diagnosis not present

## 2024-02-15 DIAGNOSIS — R498 Other voice and resonance disorders: Secondary | ICD-10-CM | POA: Diagnosis not present

## 2024-02-15 DIAGNOSIS — R488 Other symbolic dysfunctions: Secondary | ICD-10-CM | POA: Diagnosis not present

## 2024-02-16 DIAGNOSIS — R488 Other symbolic dysfunctions: Secondary | ICD-10-CM | POA: Diagnosis not present

## 2024-02-16 DIAGNOSIS — R498 Other voice and resonance disorders: Secondary | ICD-10-CM | POA: Diagnosis not present

## 2024-02-16 DIAGNOSIS — E113293 Type 2 diabetes mellitus with mild nonproliferative diabetic retinopathy without macular edema, bilateral: Secondary | ICD-10-CM | POA: Diagnosis not present

## 2024-02-16 DIAGNOSIS — M810 Age-related osteoporosis without current pathological fracture: Secondary | ICD-10-CM | POA: Diagnosis not present

## 2024-02-16 DIAGNOSIS — H26493 Other secondary cataract, bilateral: Secondary | ICD-10-CM | POA: Diagnosis not present

## 2024-02-16 LAB — HM DIABETES EYE EXAM

## 2024-02-17 DIAGNOSIS — R498 Other voice and resonance disorders: Secondary | ICD-10-CM | POA: Diagnosis not present

## 2024-02-17 DIAGNOSIS — M810 Age-related osteoporosis without current pathological fracture: Secondary | ICD-10-CM | POA: Diagnosis not present

## 2024-02-17 DIAGNOSIS — R488 Other symbolic dysfunctions: Secondary | ICD-10-CM | POA: Diagnosis not present

## 2024-02-18 DIAGNOSIS — R498 Other voice and resonance disorders: Secondary | ICD-10-CM | POA: Diagnosis not present

## 2024-02-18 DIAGNOSIS — R488 Other symbolic dysfunctions: Secondary | ICD-10-CM | POA: Diagnosis not present

## 2024-02-18 DIAGNOSIS — M810 Age-related osteoporosis without current pathological fracture: Secondary | ICD-10-CM | POA: Diagnosis not present

## 2024-02-21 ENCOUNTER — Non-Acute Institutional Stay (SKILLED_NURSING_FACILITY): Payer: Self-pay | Admitting: Adult Health

## 2024-02-21 ENCOUNTER — Encounter: Payer: Self-pay | Admitting: Adult Health

## 2024-02-21 DIAGNOSIS — Z794 Long term (current) use of insulin: Secondary | ICD-10-CM

## 2024-02-21 DIAGNOSIS — R488 Other symbolic dysfunctions: Secondary | ICD-10-CM | POA: Diagnosis not present

## 2024-02-21 DIAGNOSIS — R627 Adult failure to thrive: Secondary | ICD-10-CM | POA: Diagnosis not present

## 2024-02-21 DIAGNOSIS — E1140 Type 2 diabetes mellitus with diabetic neuropathy, unspecified: Secondary | ICD-10-CM

## 2024-02-21 DIAGNOSIS — M810 Age-related osteoporosis without current pathological fracture: Secondary | ICD-10-CM | POA: Diagnosis not present

## 2024-02-21 DIAGNOSIS — K219 Gastro-esophageal reflux disease without esophagitis: Secondary | ICD-10-CM | POA: Diagnosis not present

## 2024-02-21 DIAGNOSIS — R498 Other voice and resonance disorders: Secondary | ICD-10-CM | POA: Diagnosis not present

## 2024-02-21 NOTE — Progress Notes (Unsigned)
 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 She is losing weight from 186 pounds in April to her current weight of 165 pounds. There are reports that she is not eating well. She states that she is not hungry and does not like the food here. She has had several low cbg readings in the AM.    Past Medical History:  Diagnosis Date   Acute cystitis without hematuria    Adult failure to thrive    Anemia    Anxiety    Atherosclerosis of aorta (HCC)    Central cord syndrome at C4 level of cervical spinal cord, subsequent encounter (HCC)    Chronic kidney disease, stage IV (severe) (HCC)    CKD (chronic kidney disease)    stage 3   Depression    DM type 2 with diabetic peripheral neuropathy (HCC)    Dysphagia    GERD (gastroesophageal reflux disease)    Gout    High cholesterol    HTN (hypertension)    Hyponatremia    MDD (major depressive disorder)    Neck pain    Neuropathy    Quadriplegia, C1-C4 incomplete (HCC)    Radiculopathy    RLS (restless legs syndrome)    Unspecified convulsions (HCC)    Urinary retention     Past Surgical History:  Procedure Laterality Date   ABDOMINAL HYSTERECTOMY     ANTERIOR CERVICAL DECOMP/DISCECTOMY FUSION N/A 02/05/2021   Procedure: Cervical Three-Four  Anterior cervical  decompression/discectomy/fusion;  Surgeon: Gillie Duncans, MD;  Location: [facility] OR;  Service: Neurosurgery;  Laterality: N/A;  RM 20   APPENDECTOMY     BACK SURGERY     BALLOON DILATION N/A 11/19/2022   Procedure: BALLOON DILATION;  Surgeon: Cindie Carlin POUR, DO;  [facility]: [facility];  Service: Endoscopy;  Laterality: N/A;   BIOPSY  09/22/2021   Procedure: BIOPSY;  Surgeon: Cindie Carlin POUR, DO;  [facility];  Service: Endoscopy;;   BIOPSY  11/19/2022   Procedure: BIOPSY;  Surgeon: Cindie Carlin POUR, DO;  [facility];  Service: Endoscopy;;   CERVICAL DISC SURGERY     ESOPHAGOGASTRODUODENOSCOPY (EGD) WITH PROPOFOL  N/A 09/22/2021   Procedure: ESOPHAGOGASTRODUODENOSCOPY (EGD) WITH PROPOFOL ;  Surgeon: Cindie Carlin POUR, DO;  [facility];  Service: Endoscopy;  Laterality: N/A;  1:00pm   ESOPHAGOGASTRODUODENOSCOPY (EGD) WITH PROPOFOL  N/A 11/19/2022   Procedure: ESOPHAGOGASTRODUODENOSCOPY (EGD) WITH PROPOFOL ;  Surgeon: Cindie Carlin POUR, DO;  [facility];  Service: Endoscopy;  Laterality: N/A;  11:30 am, asa 3   FLEXIBLE SIGMOIDOSCOPY  09/20/2023   Procedure: FLEXIBLE SIGMOIDOSCOPY;  Surgeon: Cindie Carlin POUR, DO;  [facility];  Service: Endoscopy;;  HAND SURGERY     KNEE SURGERY      Social History   Socioeconomic History   Marital status: Widowed    Spouse name: Not on file   Number of children: Not on file   Years of education: Not on file   Highest education level: Not on file  Occupational History   Not on file  Tobacco Use   Smoking status: Never   Smokeless tobacco: Never  Vaping Use   Vaping status: Never Used  Substance and Sexual Activity   Alcohol use: Never   Drug use: Never   Sexual activity: Not Currently  Other Topics Concern   Not on file  Social History Narrative   L[facility] at [facility]   Social Drivers of Health   Financial Resource Strain: Not on file  Food Insecurity: No Food Insecurity  (02/20/2023)   Hunger Vital Sign    Worried About Running Out of Food in the Last Year: Never true    Ran Out of Food in the Last Year: Never true  Transportation Needs: No Transportation Needs (02/20/2023)   PRAPARE - Administrator, Civil Service (Medical): No    Lack of Transportation (Non-Medical): No  Physical Activity: Not on file  Stress: Not on file  Social Connections: Not on file  Intimate Partner Violence: Not At Risk (02/20/2023)   Humiliation, Afraid, Rape, and Kick questionnaire    Fear of Current or Ex-Partner: No    Emotionally Abused: No    Physically Abused: No    Sexually Abused: No   Family History  Problem Relation Age of Onset   Cancer Mother    Cardiomyopathy Father    Seizures Sister        childhood   Seizures Brother        in his 30s   Colon cancer Neg Hx       VITAL SIGNS BP 135/68   Pulse 79   Temp 97.8 F (36.6 C)   Resp 16   Ht 5' 2 (1.575 m)   Wt 165 lb 1.6 oz (74.9 kg)   SpO2 97%   BMI 30.20 kg/m   Outpatient Encounter Medications as of 02/21/2024  Medication Sig   acetaminophen  (TYLENOL ) 325 MG tablet Take 650 mg by mouth 3 (three) times daily.   AMBULATORY NON FORMULARY MEDICATION Medication Name:  Continue monthly catheter changes with 70fr catheter at skilled nursing facility.   amLODipine (NORVASC) 10 MG tablet Take 10 mg by mouth daily.   bisacodyl  (DULCOLAX) 10 MG suppository Place 10 mg rectally once a week. Thursday   Calcium  Carbonate Antacid (TUMS PO) Take 200 mg by mouth 2 (two) times daily as needed.   calcium -vitamin D  (OSCAL WITH D) 500-5 MG-MCG tablet Take 1 tablet by mouth 2 (two) times daily.   camphor-menthol (SARNA) lotion Apply 1 Application topically 2 (two) times daily as needed for itching.   Continuous Glucose Receiver (FREESTYLE LIBRE 2 READER) DEVI by Does not apply route. Friday   dapagliflozin  propanediol (FARXIGA ) 5 MG TABS tablet Take 5 mg by mouth daily.   DULoxetine  (CYMBALTA ) 20 MG  capsule Take 20 mg by mouth daily.   famotidine (PEPCID) 20 MG tablet Take 20 mg by mouth in the morning.   ferrous sulfate  325 (65 FE) MG EC tablet Take 325 mg by mouth 3 (three) times a week. Monday, Wednesday, and Friday.   gabapentin  (NEURONTIN ) 300 MG capsule Take 300 mg by mouth 3 (three) times  daily.   insulin  aspart (NOVOLOG  FLEXPEN) 100 UNIT/ML FlexPen Inject 5 Units into the skin 3 (three) times daily with meals. 8 am, 12 pm, and 6 pm   Insulin  Pen Needle 30G X 5 MM MISC 1 Device by Does not apply route daily. 3/16   ipratropium-albuterol  (DUONEB) 0.5-2.5 (3) MG/3ML SOLN Take 3 mLs by nebulization every 6 (six) hours as needed (wheezing).   lamoTRIgine  (LAMICTAL ) 150 MG tablet Take 150 mg by mouth 2 (two) times daily.   LANTUS  SOLOSTAR 100 UNIT/ML Solostar Pen Inject 25 Units into the skin at bedtime.   levETIRAcetam  (KEPPRA ) 500 MG tablet Take 500 mg by mouth 2 (two) times daily. Renal dose   linaclotide  (LINZESS ) 72 MCG capsule Take 72 mcg by mouth daily.   loratadine (CLARITIN) 10 MG tablet Take 10 mg by mouth daily.   LORazepam  (ATIVAN ) 2 MG/ML injection Inject 0.5 mLs (1 mg total) into the muscle every 15 (fifteen) minutes as needed.   Magnesium  Hydroxide (MILK OF MAGNESIA PO) Take 30 mLs by mouth as needed.   melatonin 5 MG TABS Take 5 mg by mouth at bedtime.   ondansetron  (ZOFRAN ) 4 MG/5ML solution Take 4 mg by mouth every 8 (eight) hours as needed for nausea or vomiting.   Phenylephrine -Cocoa Butter 0.25-85.5 % SUPP Place 1 suppository rectally 2 (two) times daily as needed.   rOPINIRole  (REQUIP ) 1 MG tablet Take 1 mg by mouth at bedtime.   rosuvastatin  (CRESTOR ) 20 MG tablet Take 20 mg by mouth at bedtime.   senna (SENOKOT) S 8.6 MG TABS tablet Take 1 tablet by mouth at bedtime.   sodium bicarbonate  650 MG tablet Take 1 tablet (650 mg total) by mouth 2 (two) times daily.   No facility-administered encounter medications on file as of 02/21/2024.     SIGNIFICANT DIAGNOSTIC  EXAMS   LABS REVIEWED PREVIOUS      02-08-23: keppra  41.6 (10-40) 02-18-23: hgb A1c 7.5; tsh 5.113 02-19-23: wbc 6.0; hgb 7.9; hct 24.7; mcv 95.7 plt 202; glucose 225; bun 45; creat 1.76; k+ 6.4; na++ 130; ca 8.7; gfr 28; (repeat k+ 5.4) 02-20-23: wbc 6.2; hgb 7.5; hct 24.1; mcv 95.6 plt 186;glucose 195; bun 45; creat 1.76; k+ 5.6; na++ 134; ca 8.5 gfr 28; protein 6.2; albumin  3.1 free t4: 0.80; mag 1.8 02-21-23: vitamin B12 781; folate 26.1; iron 26; tibc 266; ferritin 139 03-23-23: wbc 6.3; hgb 10.0; hct 31.0; mcv 92.8 plt 186; glucose 239; bun 41; creat 1.35; k+ 4.6; na++ 135; ca 8.7; gfr 39; protein 7.6 albumin  3.6  04-05-23: glucose 215; bun 49; creat 1.88; k+ 4.3; na++ 134; ca 8.4; gfr 26 04-29-23: hgb A1c 10.2   06-24-23: chol 140; ldl 64; trig 190; hdl 38 06-28-23: urine culture: proteus mirabilis: cipro  07-26-23: glucose 39; bun 30; creat 1.54; k+ 4.7; na++ 140; ca 9.0; gfr 33 hgb A1c 7.1  09-21-23: glucose 110 bun 23; creat 1.41; k+ 4.8; na++ 130; ca 9.0; gfr 37; mag 2.0  10-12-23: ACR 1050. 11-09-23: glucose 93; bun 27; creat 1.83; k+ 4.2; na++ 137; ca 9.2; gfr 27 11-22-23: wbc 4.6; hgb 8.5; hct 26.2; mcv 95.3 plt 216; glucose 169; bun 39; creat 1.58; k+ 4.5; na++ 135; ca 9.3 gfr 32; protein 6.8 albumin  3.3; hgb A1c 6.0  urine microalbumin 966.9   NO NEW LABS.   Review of Systems  Constitutional:  Positive for malaise/fatigue.       Has poor appetite   Respiratory:  Negative for cough and shortness  of breath.   Cardiovascular:  Negative for chest pain, palpitations and leg swelling.  Gastrointestinal:  Negative for abdominal pain, constipation and heartburn.  Musculoskeletal:  Negative for back pain, joint pain and myalgias.  Skin: Negative.   Neurological:  Negative for dizziness.  Psychiatric/Behavioral:  The patient is not nervous/anxious.    Physical Exam Constitutional:      General: She is not in acute distress.    Appearance: She is well-developed. She is obese. She is not  diaphoretic.  Neck:     Thyroid : No thyromegaly.   Cardiovascular:     Rate and Rhythm: Normal rate and regular rhythm.     Pulses: Normal pulses.     Heart sounds: Normal heart sounds.  Pulmonary:     Effort: Pulmonary effort is normal. No respiratory distress.     Breath sounds: Normal breath sounds.  Abdominal:     General: Bowel sounds are normal. There is no distension.     Palpations: Abdomen is soft.     Tenderness: There is no abdominal tenderness.  Genitourinary:    Comments: foley  Musculoskeletal:     Cervical back: Neck supple.     Right lower leg: No edema.     Left lower leg: No edema.     Comments: Limited range of motion in upper extremities Does not move lower extremities   Lymphadenopathy:     Cervical: No cervical adenopathy.   Skin:    General: Skin is warm and dry.   Neurological:     Mental Status: She is alert. Mental status is at baseline.     Comments: SLUMS 14/30   Psychiatric:        Mood and Affect: Mood normal.          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 Weight loss: current weight is 165 pounds; she has lost 21 pounds over the past 2 months; will begin remeron  7.5 mg nightly for 30 days will monitor her status.   3. GERD without esophagitis: is presently off medications.    PREVIOUS    5. Chronic constipation: will continue miralax  daily and linzess  72 mcg daily and senna s daily   6. Lumbar radicular syndrome/avascular necrosis bilateral hips; will continue tylenol  650 mg every 8 hours; gabapentin  300 mg three times daily and cymbalta  20 mg daily   7. Diabetic peripheral neuropathy: will continue gabapentin  300 mg three times daily   8. CKD stage 4 due to type 2 diabetes mellitus: bun 39;  creat 1.58 gfr 32   9. Anemia associated with type 2 diabetes mellitus due to underlying condition: hgb 8.5 will continue iron three times weekly    10. Urine retention/neurogenic bladder: has long term foley; is followed by urology  11. Chronic anxiety: is off medications.   12. Protein calorie malnutrition severe: albumin  3.3; protein 6.8; is presently off supplements  13. Aortic atherosclerosis (ct 01-26-21) is on asa and statin  14. Central cord syndrome at C4 level of cervical spine compression subsequent encounter/code compression quadriplegia C1-4 (01-25-21) asa 81 mg daily.  15. Neurocognitive deficits: 03-22-23 SLUMS 14/30  16. Restless leg syndrome: will continue requip  1 mg nightly  17. Hypertension associated with stage 4 chronic kidney  disease due to type 2 diabetes mellitus: b/p 135/64 will continue norvasc 10 mg daily   18. Seizure: will continue keppra  500 mg twice daily; will continue lamictal  150 mg twice daily   19. Hyperlipidemia associated with type 2 diabetes mellitus: ldl 73; will continue crestor  20 mg daily   20. Post menopausal osteoporosis t score -3.391      Barnie Seip NP [facility]   call [phone]

## 2024-02-22 ENCOUNTER — Ambulatory Visit: Payer: Medicare HMO | Admitting: Adult Health

## 2024-02-22 DIAGNOSIS — R488 Other symbolic dysfunctions: Secondary | ICD-10-CM | POA: Diagnosis not present

## 2024-02-22 DIAGNOSIS — R498 Other voice and resonance disorders: Secondary | ICD-10-CM | POA: Diagnosis not present

## 2024-02-22 DIAGNOSIS — M810 Age-related osteoporosis without current pathological fracture: Secondary | ICD-10-CM | POA: Diagnosis not present

## 2024-02-24 ENCOUNTER — Other Ambulatory Visit (HOSPITAL_COMMUNITY)
Admission: RE | Admit: 2024-02-24 | Discharge: 2024-02-24 | Disposition: A | Discharge status: Home / Self Care | Source: Skilled Nursing Facility | Attending: Adult Health | Admitting: Adult Health

## 2024-02-24 ENCOUNTER — Non-Acute Institutional Stay (SKILLED_NURSING_FACILITY): Payer: Self-pay | Admitting: Adult Health

## 2024-02-24 DIAGNOSIS — E1159 Type 2 diabetes mellitus with other circulatory complications: Secondary | ICD-10-CM | POA: Diagnosis not present

## 2024-02-24 DIAGNOSIS — R627 Adult failure to thrive: Secondary | ICD-10-CM

## 2024-02-24 DIAGNOSIS — E538 Deficiency of other specified B group vitamins: Secondary | ICD-10-CM

## 2024-02-24 LAB — COMPREHENSIVE METABOLIC PANEL WITH GFR
ALT: 7 U/L (ref 0–44)
AST: 12 U/L — ABNORMAL LOW (ref 15–41)
Albumin: 3 g/dL — ABNORMAL LOW (ref 3.5–5.0)
Alkaline Phosphatase: 59 U/L (ref 38–126)
Anion gap: 11 (ref 5–15)
BUN: 24 mg/dL — ABNORMAL HIGH (ref 8–23)
CO2: 23 mmol/L (ref 22–32)
Calcium: 8.5 mg/dL — ABNORMAL LOW (ref 8.9–10.3)
Chloride: 97 mmol/L — ABNORMAL LOW (ref 98–111)
Creatinine, Ser: 1.75 mg/dL — ABNORMAL HIGH (ref 0.44–1.00)
GFR, Estimated: 28 mL/min — ABNORMAL LOW (ref >60)
Glucose, Bld: 103 mg/dL — ABNORMAL HIGH (ref 70–99)
Potassium: 4.3 mmol/L (ref 3.5–5.1)
Sodium: 131 mmol/L — ABNORMAL LOW (ref 135–145)
Total Bilirubin: <0.2 mg/dL (ref 0.0–1.2)
Total Protein: 6.3 g/dL — ABNORMAL LOW (ref 6.5–8.1)

## 2024-02-24 LAB — CBC
HCT: 24.7 % — ABNORMAL LOW (ref 36.0–46.0)
Hemoglobin: 7.9 g/dL — ABNORMAL LOW (ref 12.0–15.0)
MCH: 30.2 pg (ref 26.0–34.0)
MCHC: 32 g/dL (ref 30.0–36.0)
MCV: 94.3 fL (ref 80.0–100.0)
Platelets: 186 10*3/uL (ref 150–400)
RBC: 2.62 MIL/uL — ABNORMAL LOW (ref 3.87–5.11)
RDW: 13.3 % (ref 11.5–15.5)
WBC: 4 10*3/uL (ref 4.0–10.5)
nRBC: 0 % (ref 0.0–0.2)

## 2024-02-24 LAB — VITAMIN B12: Vitamin B-12: 408 pg/mL (ref 180–914)

## 2024-02-24 LAB — TSH: TSH: 3.293 u[IU]/mL (ref 0.350–4.500)

## 2024-02-24 LAB — RPR: RPR Ser Ql: NONREACTIVE

## 2024-02-24 LAB — LIPID PANEL
Cholesterol: 98 mg/dL (ref 0–200)
HDL: 31 mg/dL — ABNORMAL LOW (ref >40)
LDL Cholesterol: 40 mg/dL (ref 0–99)
Total CHOL/HDL Ratio: 3.2 ratio
Triglycerides: 136 mg/dL (ref <150)
VLDL: 27 mg/dL (ref 0–40)

## 2024-02-24 LAB — IRON: Iron: 49 ug/dL (ref 28–170)

## 2024-02-24 LAB — FOLATE: Folate: 2.9 ng/mL — ABNORMAL LOW (ref >5.9)

## 2024-02-24 LAB — HEMOGLOBIN A1C
Hgb A1c MFr Bld: 6.3 % — ABNORMAL HIGH (ref 4.8–5.6)
Mean Plasma Glucose: 134.11 mg/dL

## 2024-02-24 NOTE — Progress Notes (Signed)
 Location:  [facility] Room Number: 154-W Place of Service:  SNF (31)   CODE STATUS: DNR  Allergies  Allergen Reactions   Ace Inhibitors Other (See Comments)    Hyperkalemia    Codeine     Unknown reaction   Sulfa Antibiotics     Unknown reaction   Chief Complaint  Patient presents with   Acute Visit    Lab follow up     HPI:  She is complaining that her appetite is diminished. She is complaining of losing weight. Her weight remains stable in the 160's range. She denies any uncontrolled pain. In the distant past she has been on remeron  without effect on her appetite.   Past Medical History:  Diagnosis Date   Acute cystitis without hematuria    Adult failure to thrive    Anemia    Anxiety    Atherosclerosis of aorta (HCC)    Central cord syndrome at C4 level of cervical spinal cord, subsequent encounter (HCC)    Chronic kidney disease, stage IV (severe) (HCC)    CKD (chronic kidney disease)    stage 3   Depression    DM type 2 with diabetic peripheral neuropathy (HCC)    Dysphagia    GERD (gastroesophageal reflux disease)    Gout    High cholesterol    HTN (hypertension)    Hyponatremia    MDD (major depressive disorder)    Neck pain    Neuropathy    Quadriplegia, C1-C4 incomplete (HCC)    Radiculopathy    RLS (restless legs syndrome)    Unspecified convulsions (HCC)    Urinary retention     Past Surgical History:  Procedure Laterality Date   ABDOMINAL HYSTERECTOMY     ANTERIOR CERVICAL DECOMP/DISCECTOMY FUSION N/A 02/05/2021   Procedure: Cervical Three-Four  Anterior cervical decompression/discectomy/fusion;  Surgeon: Gillie Duncans, MD;  Location: [facility] OR;  Service: Neurosurgery;  Laterality: N/A;  RM 20   APPENDECTOMY     BACK SURGERY     BALLOON DILATION N/A 11/19/2022   Procedure: BALLOON DILATION;  Surgeon: Cindie Carlin POUR, DO;  [facility];  Service: Endoscopy;  Laterality: N/A;   BIOPSY  09/22/2021   Procedure:  BIOPSY;  Surgeon: Cindie Carlin POUR, DO;  [facility];  Service: Endoscopy;;   BIOPSY  11/19/2022   Procedure: BIOPSY;  Surgeon: Cindie Carlin POUR, DO;  [facility];  Service: Endoscopy;;   CERVICAL DISC SURGERY     ESOPHAGOGASTRODUODENOSCOPY (EGD) WITH PROPOFOL  N/A 09/22/2021   Procedure: ESOPHAGOGASTRODUODENOSCOPY (EGD) WITH PROPOFOL ;  Surgeon: Cindie Carlin POUR, DO;  [facility];  Service: Endoscopy;  Laterality: N/A;  1:00pm   ESOPHAGOGASTRODUODENOSCOPY (EGD) WITH PROPOFOL  N/A 11/19/2022   Procedure: ESOPHAGOGASTRODUODENOSCOPY (EGD) WITH PROPOFOL ;  Surgeon: Cindie Carlin POUR, DO;  [facility];  Service: Endoscopy;  Laterality: N/A;  11:30 am, asa 3   FLEXIBLE SIGMOIDOSCOPY  09/20/2023   Procedure: FLEXIBLE SIGMOIDOSCOPY;  Surgeon: Cindie Carlin POUR, DO;  [facility];  Service: Endoscopy;;   HAND SURGERY     KNEE SURGERY      Social History   Socioeconomic History   Marital status: Widowed    Spouse name: Not on file   Number of children: Not on file   Years of education: Not on file   Highest education level: Not on file  Occupational History   Not on file  Tobacco Use   Smoking status: Never  Smokeless tobacco: Never  Vaping Use   Vaping status: Never Used  Substance and Sexual Activity   Alcohol use: Never   Drug use: Never   Sexual activity: Not Currently  Other Topics Concern   Not on file  Social History Narrative   Lives at [facility]   Social Drivers of Health   Financial Resource Strain: Not on file  Food Insecurity: No Food Insecurity (02/20/2023)   Hunger Vital Sign    Worried About Running Out of Food in the Last Year: Never true    Ran Out of Food in the Last Year: Never true  Transportation Needs: No Transportation Needs (02/20/2023)   PRAPARE - Administrator, Civil Service (Medical): No    Lack of Transportation (Non-Medical): No  Physical Activity: Not on file  Stress: Not  on file  Social Connections: Not on file  Intimate Partner Violence: Not At Risk (02/20/2023)   Humiliation, Afraid, Rape, and Kick questionnaire    Fear of Current or Ex-Partner: No    Emotionally Abused: No    Physically Abused: No    Sexually Abused: No   Family History  Problem Relation Age of Onset   Cancer Mother    Cardiomyopathy Father    Seizures Sister        childhood   Seizures Brother        in his 30s   Colon cancer Neg Hx       VITAL SIGNS BP (!) 141/74   Pulse 79   Temp 97.8 F (36.6 C)   Resp 16   Wt 165 lb 14.4 oz (75.3 kg)   SpO2 97%   BMI 30.34 kg/m   Outpatient Encounter Medications as of 02/24/2024  Medication Sig   acetaminophen  (TYLENOL ) 325 MG tablet Take 650 mg by mouth 3 (three) times daily.   AMBULATORY NON FORMULARY MEDICATION Medication Name:  Continue monthly catheter changes with 39fr catheter at skilled nursing facility.   amLODipine (NORVASC) 10 MG tablet Take 10 mg by mouth daily.   bisacodyl  (DULCOLAX) 10 MG suppository Place 10 mg rectally once a week. Thursday   Calcium  Carbonate Antacid (TUMS PO) Take 200 mg by mouth 2 (two) times daily as needed.   calcium -vitamin D  (OSCAL WITH D) 500-5 MG-MCG tablet Take 1 tablet by mouth 2 (two) times daily.   camphor-menthol (SARNA) lotion Apply 1 Application topically 2 (two) times daily as needed for itching.   Continuous Glucose Receiver (FREESTYLE LIBRE 2 READER) DEVI by Does not apply route. Friday   Continuous Glucose Sensor (FREESTYLE LIBRE 2 SENSOR) MISC    dapagliflozin  propanediol (FARXIGA ) 5 MG TABS tablet Take 5 mg by mouth daily.   DULoxetine  (CYMBALTA ) 20 MG capsule Take 20 mg by mouth daily.   famotidine (PEPCID) 20 MG tablet Take 20 mg by mouth in the morning.   ferrous sulfate  325 (65 FE) MG EC tablet Take 325 mg by mouth 3 (three) times a week. Monday, Wednesday, and Friday.   gabapentin  (NEURONTIN ) 300 MG capsule Take 300 mg by mouth 3 (three) times daily.   insulin  aspart  (NOVOLOG  FLEXPEN) 100 UNIT/ML FlexPen Inject 5 Units into the skin 3 (three) times daily with meals. 8 am, 12 pm, and 6 pm   insulin  glargine (LANTUS  SOLOSTAR) 100 UNIT/ML Solostar Pen Inject 20 Units into the skin at bedtime.   Insulin  Pen Needle 30G X 5 MM MISC 1 Device by Does not apply route daily. 3/16   ipratropium-albuterol  (  DUONEB) 0.5-2.5 (3) MG/3ML SOLN Take 3 mLs by nebulization every 6 (six) hours as needed (wheezing).   lamoTRIgine  (LAMICTAL ) 150 MG tablet Take 150 mg by mouth 2 (two) times daily.   levETIRAcetam  (KEPPRA ) 500 MG tablet Take 500 mg by mouth 2 (two) times daily. Renal dose   linaclotide  (LINZESS ) 72 MCG capsule Take 72 mcg by mouth daily.   loratadine (CLARITIN) 10 MG tablet Take 10 mg by mouth daily.   LORazepam  (ATIVAN ) 2 MG/ML injection Inject 0.5 mLs (1 mg total) into the muscle every 15 (fifteen) minutes as needed.   Magnesium  Hydroxide (MILK OF MAGNESIA PO) Take 30 mLs by mouth as needed.   melatonin 5 MG TABS Take 5 mg by mouth at bedtime.   mirtazapine  (REMERON ) 7.5 MG tablet Take 7.5 mg by mouth at bedtime.   ondansetron  (ZOFRAN ) 4 MG/5ML solution Take 4 mg by mouth every 8 (eight) hours as needed for nausea or vomiting.   Phenylephrine -Cocoa Butter 0.25-85.5 % SUPP Place 1 suppository rectally 2 (two) times daily as needed.   rOPINIRole  (REQUIP ) 1 MG tablet Take 1 mg by mouth at bedtime.   rosuvastatin  (CRESTOR ) 20 MG tablet Take 20 mg by mouth at bedtime.   sennosides-docusate sodium  (SENOKOT-S) 8.6-50 MG tablet Take 1 tablet by mouth daily.   sodium bicarbonate  650 MG tablet Take 1 tablet (650 mg total) by mouth 2 (two) times daily.   zinc oxide 20 % ointment Apply 1 Application topically as needed for irritation (Every Shift PRN).   No facility-administered encounter medications on file as of 02/24/2024.     SIGNIFICANT DIAGNOSTIC EXAMS   LABS REVIEWED PREVIOUS      02-08-23: keppra  41.6 (10-40) 02-18-23: hgb A1c 7.5; tsh 5.113 02-19-23: wbc 6.0;  hgb 7.9; hct 24.7; mcv 95.7 plt 202; glucose 225; bun 45; creat 1.76; k+ 6.4; na++ 130; ca 8.7; gfr 28; (repeat k+ 5.4) 02-20-23: wbc 6.2; hgb 7.5; hct 24.1; mcv 95.6 plt 186;glucose 195; bun 45; creat 1.76; k+ 5.6; na++ 134; ca 8.5 gfr 28; protein 6.2; albumin  3.1 free t4: 0.80; mag 1.8 02-21-23: vitamin B12 781; folate 26.1; iron 26; tibc 266; ferritin 139 03-23-23: wbc 6.3; hgb 10.0; hct 31.0; mcv 92.8 plt 186; glucose 239; bun 41; creat 1.35; k+ 4.6; na++ 135; ca 8.7; gfr 39; protein 7.6 albumin  3.6  04-05-23: glucose 215; bun 49; creat 1.88; k+ 4.3; na++ 134; ca 8.4; gfr 26 04-29-23: hgb A1c 10.2   06-24-23: chol 140; ldl 64; trig 190; hdl 38 06-28-23: urine culture: proteus mirabilis: cipro  07-26-23: glucose 39; bun 30; creat 1.54; k+ 4.7; na++ 140; ca 9.0; gfr 33 hgb A1c 7.1  09-21-23: glucose 110 bun 23; creat 1.41; k+ 4.8; na++ 130; ca 9.0; gfr 37; mag 2.0  10-12-23: ACR 1050. 11-09-23: glucose 93; bun 27; creat 1.83; k+ 4.2; na++ 137; ca 9.2; gfr 27 11-22-23: wbc 4.6; hgb 8.5; hct 26.2; mcv 95.3 plt 216; glucose 169; bun 39; creat 1.58; k+ 4.5; na++ 135; ca 9.3 gfr 32; protein 6.8 albumin  3.3; hgb A1c 6.0  urine microalbumin 966.9   TODAY  02-14-24: wbc 4.0; hgb 7.9; hct 24.7; mcv 994.3 plt 186; glucose 103; bun 24; creat 1.75; k+ 4.3; na++ 131; ca 8.5 gfr 28; protein 6.3 albumin  3.0; hgb A1c 6.3; rpr: nr; vitamin B 12: 408; folate 2.9; iron 49; tsh 3.293; chol 98; ldl 40; trig 136; hdl 31   Review of Systems  Constitutional:  Positive for malaise/fatigue and weight loss.  Respiratory:  Negative for cough and shortness of breath.   Cardiovascular:  Negative for chest pain, palpitations and leg swelling.  Gastrointestinal:  Negative for abdominal pain, constipation and heartburn.  Musculoskeletal:  Negative for back pain, joint pain and myalgias.  Skin: Negative.   Neurological:  Negative for dizziness.  Psychiatric/Behavioral:  The patient is not nervous/anxious.    Physical  Exam Constitutional:      General: She is not in acute distress.    Appearance: She is well-developed and overweight. She is not diaphoretic.  Neck:     Thyroid : No thyromegaly.   Cardiovascular:     Rate and Rhythm: Normal rate and regular rhythm.     Heart sounds: Normal heart sounds.  Pulmonary:     Effort: Pulmonary effort is normal. No respiratory distress.     Breath sounds: Normal breath sounds.  Abdominal:     General: Bowel sounds are normal. There is no distension.     Palpations: Abdomen is soft.     Tenderness: There is no abdominal tenderness.  Genitourinary:    Comments: foley  Musculoskeletal:     Cervical back: Neck supple.     Right lower leg: No edema.     Left lower leg: No edema.     Comments: Limited range of motion in upper extremities Does not move lower extremities    Lymphadenopathy:     Cervical: No cervical adenopathy.   Skin:    General: Skin is warm and dry.   Neurological:     Mental Status: She is alert. Mental status is at baseline.     Comments:  SLUMS 14/30   Psychiatric:        Mood and Affect: Mood normal.     ASSESSMENT/ PLAN:  TODAY  Folate deficiency Adult failure to thrive syndrome.  Will begin folate 1 mg daily and will monitor    Barnie Seip NP [facility]  call [phone]

## 2024-02-29 DIAGNOSIS — E538 Deficiency of other specified B group vitamins: Secondary | ICD-10-CM | POA: Insufficient documentation

## 2024-03-02 ENCOUNTER — Encounter: Payer: Self-pay | Admitting: Adult Health

## 2024-03-02 ENCOUNTER — Non-Acute Institutional Stay (SKILLED_NURSING_FACILITY): Admitting: Adult Health

## 2024-03-02 DIAGNOSIS — R627 Adult failure to thrive: Secondary | ICD-10-CM

## 2024-03-02 DIAGNOSIS — E43 Unspecified severe protein-calorie malnutrition: Secondary | ICD-10-CM | POA: Diagnosis not present

## 2024-03-02 DIAGNOSIS — M87051 Idiopathic aseptic necrosis of right femur: Secondary | ICD-10-CM | POA: Diagnosis not present

## 2024-03-02 DIAGNOSIS — M87052 Idiopathic aseptic necrosis of left femur: Secondary | ICD-10-CM

## 2024-03-02 NOTE — Progress Notes (Signed)
 Location:  [facility] Room Number: 154-W Place of Service:  SNF (31)   CODE STATUS: DNR  Allergies  Allergen Reactions   Ace Inhibitors Other (See Comments)    Hyperkalemia    Codeine     Unknown reaction   Sulfa Antibiotics     Unknown reaction   Chief Complaint  Patient presents with   Acute Visit    Care plan meeting     HPI:  We have come together for her care plan meeting. Family present  BIMS 13/15 mood mood 3/30: nervous at times, delusions: people laying her on floor and leaving her there. She does get out of bed to wheelchair without falls. She requires dependent assist with her adl care. She is incontinent of bowel; has foley. Dietary: setup for meals. Regular diet appetite 0-25%, weight is 165.9 pound is down 10.3% over the past 6 months. She is eating junk food instead of her meals. To help with appetite she is on remeron  7.5 mg nightly through 03-25-24. Therapy: none at this time. Activities: family visits 1:1; is rarely getting out of bed at this time. She will continue to be followed for her chronic illnesses including: Adult failure to thrive syndrome  Protein calorie malnutrition, severe  Avascular necrosis of bones of both hips.   Past Medical History:  Diagnosis Date   Acute cystitis without hematuria    Adult failure to thrive    Anemia    Anxiety    Atherosclerosis of aorta (HCC)    Central cord syndrome at C4 level of cervical spinal cord, subsequent encounter (HCC)    Chronic kidney disease, stage IV (severe) (HCC)    CKD (chronic kidney disease)    stage 3   Depression    DM type 2 with diabetic peripheral neuropathy (HCC)    Dysphagia    GERD (gastroesophageal reflux disease)    Gout    High cholesterol    HTN (hypertension)    Hyponatremia    MDD (major depressive disorder)    Neck pain    Neuropathy    Quadriplegia, C1-C4 incomplete (HCC)    Radiculopathy    RLS (restless legs syndrome)    Unspecified convulsions  (HCC)    Urinary retention     Past Surgical History:  Procedure Laterality Date   ABDOMINAL HYSTERECTOMY     ANTERIOR CERVICAL DECOMP/DISCECTOMY FUSION N/A 02/05/2021   Procedure: Cervical Three-Four  Anterior cervical decompression/discectomy/fusion;  Surgeon: Gillie Duncans, MD;  Location: [facility] OR;  Service: Neurosurgery;  Laterality: N/A;  RM 20   APPENDECTOMY     BACK SURGERY     BALLOON DILATION N/A 11/19/2022   Procedure: BALLOON DILATION;  Surgeon: Cindie Carlin POUR, DO;  Location: [facility];  Service: Endoscopy;  Laterality: N/A;   BIOPSY  09/22/2021   Procedure: BIOPSY;  Surgeon: Cindie Carlin POUR, DO;  Location: [facility];  Service: Endoscopy;;   BIOPSY  11/19/2022   Procedure: BIOPSY;  Surgeon: Cindie Carlin POUR, DO;  Location: [facility];  Service: Endoscopy;;   CERVICAL DISC SURGERY     ESOPHAGOGASTRODUODENOSCOPY (EGD) WITH PROPOFOL  N/A 09/22/2021   Procedure: ESOPHAGOGASTRODUODENOSCOPY (EGD) WITH PROPOFOL ;  Surgeon: Cindie Carlin POUR, DO;  Location: [facility];  Service: Endoscopy;  Laterality: N/A;  1:00pm   ESOPHAGOGASTRODUODENOSCOPY (EGD) WITH PROPOFOL  N/A 11/19/2022   Procedure: ESOPHAGOGASTRODUODENOSCOPY (EGD) WITH PROPOFOL ;  Surgeon: Cindie Carlin POUR, DO;  Location: [facility];  Service: Endoscopy;  Laterality: N/A;  11:30  am, asa 3   FLEXIBLE SIGMOIDOSCOPY  09/20/2023   Procedure: FLEXIBLE SIGMOIDOSCOPY;  Surgeon: Cindie Carlin POUR, DO;  Location: [facility];  Service: Endoscopy;;   HAND SURGERY     KNEE SURGERY      Social History   Socioeconomic History   Marital status: Widowed    Spouse name: Not on file   Number of children: Not on file   Years of education: Not on file   Highest education level: Not on file  Occupational History   Not on file  Tobacco Use   Smoking status: Never   Smokeless tobacco: Never  Vaping Use   Vaping status: Never Used  Substance and Sexual Activity   Alcohol use: Never   Drug use: Never   Sexual activity:  Not Currently  Other Topics Concern   Not on file  Social History Narrative   Lives at [facility]   Social Drivers of Health   Financial Resource Strain: Not on file  Food Insecurity: No Food Insecurity (02/20/2023)   Hunger Vital Sign    Worried About Running Out of Food in the Last Year: Never true    Ran Out of Food in the Last Year: Never true  Transportation Needs: No Transportation Needs (02/20/2023)   PRAPARE - Administrator, Civil Service (Medical): No    Lack of Transportation (Non-Medical): No  Physical Activity: Not on file  Stress: Not on file  Social Connections: Not on file  Intimate Partner Violence: Not At Risk (02/20/2023)   Humiliation, Afraid, Rape, and Kick questionnaire    Fear of Current or Ex-Partner: No    Emotionally Abused: No    Physically Abused: No    Sexually Abused: No   Family History  Problem Relation Age of Onset   Cancer Mother    Cardiomyopathy Father    Seizures Sister        childhood   Seizures Brother        in his 20s   Colon cancer Neg Hx       VITAL SIGNS BP (!) 141/74   Pulse 85   Temp (!) 97.4 F (36.3 C)   Resp 20   Ht 5' 2 (1.575 m)   Wt 166 lb 12.8 oz (75.7 kg)   SpO2 97%   BMI 30.51 kg/m   Outpatient Encounter Medications as of 03/02/2024  Medication Sig   acetaminophen  (TYLENOL ) 325 MG tablet Take 650 mg by mouth 3 (three) times daily.   AMBULATORY NON FORMULARY MEDICATION Medication Name:  Continue monthly catheter changes with 67fr catheter at skilled nursing facility.   amLODipine (NORVASC) 10 MG tablet Take 10 mg by mouth daily.   bisacodyl  (DULCOLAX) 10 MG suppository Place 10 mg rectally once a week. Thursday   Calcium  Carbonate Antacid (TUMS PO) Take 200 mg by mouth 2 (two) times daily as needed.   calcium -vitamin D  (OSCAL WITH D) 500-5 MG-MCG tablet Take 1 tablet by mouth 2 (two) times daily.   camphor-menthol (SARNA) lotion Apply 1 Application topically 2 (two) times daily as  needed for itching.   Continuous Glucose Receiver (FREESTYLE LIBRE 2 READER) DEVI by Does not apply route. Friday   Continuous Glucose Sensor (FREESTYLE LIBRE 2 SENSOR) MISC    dapagliflozin  propanediol (FARXIGA ) 5 MG TABS tablet Take 5 mg by mouth daily.   DULoxetine  (CYMBALTA ) 20 MG capsule Take 20 mg by mouth daily.   famotidine (PEPCID) 20 MG tablet Take 20 mg by  mouth in the morning.   ferrous sulfate  325 (65 FE) MG EC tablet Take 325 mg by mouth 3 (three) times a week. Monday, Wednesday, and Friday.   folic acid  (FOLVITE ) 1 MG tablet Take 1 mg by mouth daily.   gabapentin  (NEURONTIN ) 300 MG capsule Take 300 mg by mouth 3 (three) times daily.   insulin  aspart (NOVOLOG  FLEXPEN) 100 UNIT/ML FlexPen Inject 5 Units into the skin 3 (three) times daily with meals. 8 am, 12 pm, and 6 pm   insulin  glargine (LANTUS  SOLOSTAR) 100 UNIT/ML Solostar Pen Inject 20 Units into the skin at bedtime.   Insulin  Pen Needle 30G X 5 MM MISC 1 Device by Does not apply route daily. 3/16   ipratropium-albuterol  (DUONEB) 0.5-2.5 (3) MG/3ML SOLN Take 3 mLs by nebulization every 6 (six) hours as needed (wheezing).   lamoTRIgine  (LAMICTAL ) 150 MG tablet Take 150 mg by mouth 2 (two) times daily.   levETIRAcetam  (KEPPRA ) 500 MG tablet Take 500 mg by mouth 2 (two) times daily. Renal dose   linaclotide  (LINZESS ) 72 MCG capsule Take 72 mcg by mouth daily.   loratadine (CLARITIN) 10 MG tablet Take 10 mg by mouth daily.   LORazepam  (ATIVAN ) 2 MG/ML injection Inject 0.5 mLs (1 mg total) into the muscle every 15 (fifteen) minutes as needed.   Magnesium  Hydroxide (MILK OF MAGNESIA PO) Take 30 mLs by mouth as needed.   melatonin 5 MG TABS Take 5 mg by mouth at bedtime.   mirtazapine  (REMERON ) 7.5 MG tablet Take 7.5 mg by mouth at bedtime.   ondansetron  (ZOFRAN ) 4 MG/5ML solution Take 4 mg by mouth every 8 (eight) hours as needed for nausea or vomiting.   Phenylephrine -Cocoa Butter 0.25-85.5 % SUPP Place 1 suppository rectally 2  (two) times daily as needed.   rOPINIRole  (REQUIP ) 1 MG tablet Take 1 mg by mouth at bedtime.   rosuvastatin  (CRESTOR ) 20 MG tablet Take 20 mg by mouth at bedtime.   sennosides-docusate sodium  (SENOKOT-S) 8.6-50 MG tablet Take 1 tablet by mouth daily.   sodium bicarbonate  650 MG tablet Take 1 tablet (650 mg total) by mouth 2 (two) times daily.   zinc oxide 20 % ointment Apply 1 Application topically as needed for irritation (Every Shift PRN).   No facility-administered encounter medications on file as of 03/02/2024.     SIGNIFICANT DIAGNOSTIC EXAMS   LABS REVIEWED PREVIOUS      03-23-23: wbc 6.3; hgb 10.0; hct 31.0; mcv 92.8 plt 186; glucose 239; bun 41; creat 1.35; k+ 4.6; na++ 135; ca 8.7; gfr 39; protein 7.6 albumin  3.6  04-05-23: glucose 215; bun 49; creat 1.88; k+ 4.3; na++ 134; ca 8.4; gfr 26 04-29-23: hgb A1c 10.2   06-24-23: chol 140; ldl 64; trig 190; hdl 38 06-28-23: urine culture: proteus mirabilis: cipro  07-26-23: glucose 39; bun 30; creat 1.54; k+ 4.7; na++ 140; ca 9.0; gfr 33 hgb A1c 7.1  09-21-23: glucose 110 bun 23; creat 1.41; k+ 4.8; na++ 130; ca 9.0; gfr 37; mag 2.0  10-12-23: ACR 1050. 11-09-23: glucose 93; bun 27; creat 1.83; k+ 4.2; na++ 137; ca 9.2; gfr 27 11-22-23: wbc 4.6; hgb 8.5; hct 26.2; mcv 95.3 plt 216; glucose 169; bun 39; creat 1.58; k+ 4.5; na++ 135; ca 9.3 gfr 32; protein 6.8 albumin  3.3; hgb A1c 6.0  urine microalbumin 966.9  02-14-24: wbc 4.0; hgb 7.9; hct 24.7; mcv 994.3 plt 186; glucose 103; bun 24; creat 1.75; k+ 4.3; na++ 131; ca 8.5 gfr 28; protein 6.3  albumin  3.0; hgb A1c 6.3; rpr: nr; vitamin B 12: 408; folate 2.9; iron 49; tsh 3.293; chol 98; ldl 40; trig 136; hdl 31  NO NEW LABS.    Review of Systems  Constitutional:  Negative for malaise/fatigue.  Respiratory:  Negative for cough and shortness of breath.   Cardiovascular:  Negative for chest pain, palpitations and leg swelling.  Gastrointestinal:  Negative for abdominal pain, constipation and  heartburn.  Musculoskeletal:  Negative for back pain, joint pain and myalgias.  Skin: Negative.   Neurological:  Negative for dizziness.  Psychiatric/Behavioral:  The patient is not nervous/anxious.     Physical Exam Constitutional:      General: She is not in acute distress.    Appearance: She is well-developed and overweight. She is not diaphoretic.  Neck:     Thyroid : No thyromegaly.  Cardiovascular:     Rate and Rhythm: Normal rate and regular rhythm.     Pulses: Normal pulses.     Heart sounds: Normal heart sounds.  Pulmonary:     Effort: Pulmonary effort is normal. No respiratory distress.     Breath sounds: Normal breath sounds.  Abdominal:     General: Bowel sounds are normal. There is no distension.     Palpations: Abdomen is soft.     Tenderness: There is no abdominal tenderness.  Genitourinary:    Comments: foley Musculoskeletal:     Cervical back: Neck supple.     Right lower leg: No edema.     Left lower leg: No edema.     Comments:  Limited range of motion in upper extremities Does not move lower extremities    Lymphadenopathy:     Cervical: No cervical adenopathy.  Skin:    General: Skin is warm and dry.  Neurological:     Mental Status: She is alert. Mental status is at baseline.  Psychiatric:        Mood and Affect: Mood normal.     ASSESSMENT/ PLAN:  TODAY  Adult failure to thrive syndrome  Protein calorie malnutrition, severe Avascular necrosis of bones of both hips.   Will continue current medications Will continue current plan of care Will continue to monitor her status.    Time spent with patient: 40 minutes: diet; weight loss; plan of care.    Barnie Seip NP [facility]   call [phone]

## 2024-03-16 ENCOUNTER — Ambulatory Visit: Admitting: Gastroenterology

## 2024-03-22 ENCOUNTER — Non-Acute Institutional Stay (SKILLED_NURSING_FACILITY): Payer: Self-pay | Admitting: Internal Medicine

## 2024-03-22 ENCOUNTER — Encounter: Payer: Self-pay | Admitting: Internal Medicine

## 2024-03-22 DIAGNOSIS — E039 Hypothyroidism, unspecified: Secondary | ICD-10-CM | POA: Diagnosis not present

## 2024-03-22 DIAGNOSIS — Z862 Personal history of diseases of the blood and blood-forming organs and certain disorders involving the immune mechanism: Secondary | ICD-10-CM

## 2024-03-22 DIAGNOSIS — E43 Unspecified severe protein-calorie malnutrition: Secondary | ICD-10-CM | POA: Diagnosis not present

## 2024-03-22 DIAGNOSIS — N189 Chronic kidney disease, unspecified: Secondary | ICD-10-CM | POA: Diagnosis not present

## 2024-03-22 DIAGNOSIS — Z794 Long term (current) use of insulin: Secondary | ICD-10-CM | POA: Diagnosis not present

## 2024-03-22 DIAGNOSIS — E1140 Type 2 diabetes mellitus with diabetic neuropathy, unspecified: Secondary | ICD-10-CM | POA: Diagnosis not present

## 2024-03-22 NOTE — Assessment & Plan Note (Signed)
 Diabetes control is excellent based on an A1c of 6.3%.  No change indicated.

## 2024-03-22 NOTE — Assessment & Plan Note (Signed)
 There has been progression of her CKD which is now high stage IV with a creatinine of 1.75 with a GFR of 28.  There is also been progression of the anemia serially with current H/H of 7.9/24.7.  B12 levels are normal but folate was low at 2.9.  She does receive both iron and folate supplements. CBC will be rechecked to rule out further progression.

## 2024-03-22 NOTE — Assessment & Plan Note (Signed)
 There has been progression of her protein/caloric malnutrition with current albumin  of 3.0 and total protein 6.3. She is on Remeron . Nutritionist continues to monitor.

## 2024-03-22 NOTE — Patient Instructions (Signed)
 See assessment and plan under each diagnosis in the problem list and acutely for this visit

## 2024-03-22 NOTE — Progress Notes (Unsigned)
 NURSING HOME LOCATION:  [facility] ROOM NUMBER:  [id number] W  CODE STATUS:  DNR  PCP: [name], NP   This is a nursing facility follow up visit of chronic medical diagnoses to document compliance with Regulation 483.30 (c) in The Long Term Care Survey Manual Phase 2 which mandates caregiver visit ( visits can alternate among physician, PA or NP as per statutes) within 10 days of 30 days / 60 days/ 90 days post admission to SNF date  .  Interim medical record and care since last SNF visit was updated with review of diagnostic studies and change in clinical status since last visit were documented.  HPI: She is a permanent resident of this facility with diagnoses of posttraumatic central cord syndrome for which she had decompression, discectomy, and fusion.  Other diagnoses include obstruction of the aorta, diabetes with CKD and peripheral neuropathy, GERD with dysphagia, history of gout, essential hypertension, dyslipidemia, major depressive disorder, history of convulsions, and RLS. Labs are current as of 02/24/2024.  There has been progression of her CKD and is now high stage IV.  On 03/23/2023 creatinine was 1.36 and GFR 39; creatinine peaked at 1.88 with a nadir GFR of 26.  Current values are 1.75 and 28.  There has been progression of her anemia of the same.  With the H/H10/31 dropping to 7.9/24.7 presently.  Iron and B12 levels are normal her folate was low at 2.9.  She is on iron 3 times a week and daily folic acid .  Diabetes is well-controlled as evidenced by an A1c of 6.3%.  Review of systems: She states that she is good.  When asked about h block er appetite and meal intake she stated that she mainly eat vegetables.  She has chronic numbness and tingling in her hands.  She describes occasional cough.  She denies any bleeding dyscrasias.   Constitutional: No fever, significant weight change, fatigue  Eyes: No redness, discharge, pain, vision change ENT/mouth: No  nasal congestion,  purulent discharge, earache, change in hearing, sore throat  Cardiovascular: No chest pain, palpitations, paroxysmal nocturnal dyspnea, claudication, edema  Respiratory: No cough, sputum production, hemoptysis, DOE, significant snoring, apnea   Gastrointestinal: No heartburn, dysphagia, abdominal pain, nausea /vomiting, rectal bleeding, melena, change in bowels Genitourinary: No dysuria, hematuria, pyuria, incontinence, nocturia Musculoskeletal: No joint stiffness, joint swelling, weakness, pain Dermatologic: No rash, pruritus, change in appearance of skin Neurologic: No dizziness, headache, syncope, seizures, numbness, tingling Psychiatric: No significant anxiety, depression, insomnia, anorexia Endocrine: No change in hair/skin/nails, excessive thirst, excessive hunger, excessive urination  Hematologic/lymphatic: No significant bruising, lymphadenopathy, abnormal bleeding Allergy/immunology: No itchy/watery eyes, significant sneezing, urticaria, angioedema  Physical exam:  Pertinent or positive findings: She was initially asleep exhibiting hypopnea without frank apnea or snoring.  She did rouse easily.  There is disheveled.  She appeared lethargic even after awakening.  Her voice is weak and almost difficult to discern.  There is no power appropriately left upper lateral forehead.  She is wearing only the upper plate.  The tongue is dry.  Breath sounds are decreased.  Pedal pulses are decreased to palpation.  She has trace edema at the sock line.  Interosseous wasting of the hands is present.  She is weak throughout position in the upper extremities especially on the left.  General appearance: Adequately nourished; no acute distress, increased work of breathing is present.   Lymphatic: No lymphadenopathy about the head, neck, axilla. Eyes: No conjunctival inflammation or lid edema  is present. There is no scleral icterus. Ears:  External ear exam shows no significant lesions or  deformities.   Nose:  External nasal examination shows no deformity or inflammation. Nasal mucosa are pink and moist without lesions, exudates Oral exam:  Lips and gums are healthy appearing. There is no oropharyngeal erythema or exudate. Neck:  No thyromegaly, masses, tenderness noted.    Heart:  Normal rate and regular rhythm. S1 and S2 normal without gallop, murmur, click, rub .  Lungs: Chest clear to auscultation without wheezes, rhonchi, rales, rubs. Abdomen: Bowel sounds are normal. Abdomen is soft and nontender with no organomegaly, hernias, masses. GU: Deferred  Extremities:  No cyanosis, clubbing, edema  Neurologic exam : Cn 2-7 intact Strength equal  in upper & lower extremities Balance, Rhomberg, finger to nose testing could not be completed due to clinical state Deep tendon reflexes are equal Skin: Warm & dry w/o tenting. No significant lesions or rash.  See summary under each active problem in the Problem List with associated updated therapeutic plan

## 2024-03-22 NOTE — Assessment & Plan Note (Signed)
TSH is therapeutic; no change indicated. 

## 2024-03-23 ENCOUNTER — Other Ambulatory Visit (HOSPITAL_COMMUNITY)
Admission: RE | Admit: 2024-03-23 | Discharge: 2024-03-23 | Disposition: A | Discharge status: Home / Self Care | Source: Skilled Nursing Facility | Attending: Internal Medicine | Admitting: Internal Medicine

## 2024-03-23 ENCOUNTER — Encounter: Payer: Self-pay | Admitting: Internal Medicine

## 2024-03-23 DIAGNOSIS — N184 Chronic kidney disease, stage 4 (severe): Secondary | ICD-10-CM | POA: Insufficient documentation

## 2024-03-23 LAB — CBC WITH DIFFERENTIAL/PLATELET
Abs Immature Granulocytes: 0.01 K/uL (ref 0.00–0.07)
Basophils Absolute: 0 K/uL (ref 0.0–0.1)
Basophils Relative: 0 %
Eosinophils Absolute: 0.2 K/uL (ref 0.0–0.5)
Eosinophils Relative: 4 %
HCT: 26.6 % — ABNORMAL LOW (ref 36.0–46.0)
Hemoglobin: 8.3 g/dL — ABNORMAL LOW (ref 12.0–15.0)
Immature Granulocytes: 0 %
Lymphocytes Relative: 36 %
Lymphs Abs: 2.2 K/uL (ref 0.7–4.0)
MCH: 31 pg (ref 26.0–34.0)
MCHC: 31.2 g/dL (ref 30.0–36.0)
MCV: 99.3 fL (ref 80.0–100.0)
Monocytes Absolute: 0.5 K/uL (ref 0.1–1.0)
Monocytes Relative: 7 %
Neutro Abs: 3.2 K/uL (ref 1.7–7.7)
Neutrophils Relative %: 53 %
Platelets: 173 K/uL (ref 150–400)
RBC: 2.68 MIL/uL — ABNORMAL LOW (ref 3.87–5.11)
RDW: 14.5 % (ref 11.5–15.5)
WBC: 6.1 K/uL (ref 4.0–10.5)
nRBC: 0 % (ref 0.0–0.2)

## 2024-03-23 LAB — BASIC METABOLIC PANEL WITH GFR
Anion gap: 10 (ref 5–15)
BUN: 40 mg/dL — ABNORMAL HIGH (ref 8–23)
CO2: 23 mmol/L (ref 22–32)
Calcium: 9.7 mg/dL (ref 8.9–10.3)
Chloride: 103 mmol/L (ref 98–111)
Creatinine, Ser: 2.07 mg/dL — ABNORMAL HIGH (ref 0.44–1.00)
GFR, Estimated: 23 mL/min — ABNORMAL LOW (ref >60)
Glucose, Bld: 128 mg/dL — ABNORMAL HIGH (ref 70–99)
Potassium: 5.3 mmol/L — ABNORMAL HIGH (ref 3.5–5.1)
Sodium: 136 mmol/L (ref 135–145)

## 2024-03-29 ENCOUNTER — Ambulatory Visit: Admitting: Gastroenterology

## 2024-03-30 ENCOUNTER — Encounter: Payer: Self-pay | Admitting: Gastroenterology

## 2024-03-30 ENCOUNTER — Ambulatory Visit (INDEPENDENT_AMBULATORY_CARE_PROVIDER_SITE_OTHER): Admitting: Gastroenterology

## 2024-03-30 VITALS — BP 135/70 | HR 60 | Temp 97.8°F | Ht 62.0 in | Wt 164.5 lb

## 2024-03-30 DIAGNOSIS — K5900 Constipation, unspecified: Secondary | ICD-10-CM | POA: Diagnosis not present

## 2024-03-30 DIAGNOSIS — R109 Unspecified abdominal pain: Secondary | ICD-10-CM

## 2024-03-30 DIAGNOSIS — R630 Anorexia: Secondary | ICD-10-CM

## 2024-03-30 DIAGNOSIS — R1032 Left lower quadrant pain: Secondary | ICD-10-CM | POA: Diagnosis not present

## 2024-03-30 DIAGNOSIS — K5732 Diverticulitis of large intestine without perforation or abscess without bleeding: Secondary | ICD-10-CM

## 2024-03-30 DIAGNOSIS — K219 Gastro-esophageal reflux disease without esophagitis: Secondary | ICD-10-CM | POA: Diagnosis not present

## 2024-03-30 DIAGNOSIS — Z8719 Personal history of other diseases of the digestive system: Secondary | ICD-10-CM | POA: Diagnosis not present

## 2024-03-30 NOTE — Patient Instructions (Signed)
 We will continue current bowel regimen of Linzess  daily, Senokot 2 tablets nightly, and famotidine 20 mg daily.  Will also continue Dulcolax suppository once weekly but if Sunday through Tuesday having more difficulty with constipation or hard stools will give an extra Dulcolax suppository if needed.  Follow a GERD diet:  Avoid fried, fatty, greasy, spicy, citrus foods. Avoid caffeine and carbonated beverages. Avoid chocolate. Try eating 4-6 small meals a day rather than 3 large meals. Do not eat within 3 hours of laying down. Prop head of bed up on wood or bricks to create a 6 inch incline.  Follow-up in 1 year, sooner if needed.  It was a pleasure to see you today. I want to create trusting relationships with patients. If you receive a survey regarding your visit,  I greatly appreciate you taking time to fill this out on paper or through your MyChart. I value your feedback.  Charmaine Melia, MSN, FNP-BC, AGACNP-BC [facility]

## 2024-03-30 NOTE — Progress Notes (Signed)
 GI Office Note    Referring Provider: [name], NP Primary Care Physician:  Gwendolyn Barnie RAMAN, NP Primary Gastroenterologist: Gwendolyn POUR. Cindie, [name]  Date:  03/30/2024  ID:  [name], DOB 1936/06/10, MRN [id number]  Chief Complaint   Chief Complaint  Patient presents with   Follow-up    Has been having some left side pain   History of Present Illness  [name] is a 88 y.o. female with a history of CKD stage IV, anemia of chronic disease, diabetes, HTN, HLD, central cord syndrome at C4 secondary to fall in May 2022, chronic constipation and left-sided abdominal pain, mild diverticulitis June 2024 presenting today for follow up with complaint of ongoing intermittent left sided abdominal pain.   EGD 09/22/21: -gastritis s/p biopsy -normal duodenum -omeprazole  40 mg daily -Reglan  5 mg QID   Two ED visits recently for constipation and abdominal pain earlier this month. CT as outlined below. Treated with soap suds enema one instance. No changes to bowel regimen made, continue metamucil.   CT A/P 10/01/22: -mild fullness in right renal collecting system -changes consistent with rectal impaction   LFTs normal. Hgb 9.6, Glucose 404 on 10/01/22.    OV 10/20/22. Seen for rectal pain and abdominal pain.  Abdominal pain reported as being sharp.  Daughter reported issues since she had a fall.  Starts in the left and radiates to her left upper abdomen.  Has some nausea but no vomiting.  Reports some esophageal burning and mild dysphagia due to issues swallowing.  Patient was unsure of her frequency of bowel movements.  States poor appetite due to pain in all the likely change in food choices at SNF.  Also reported bloating and belching.  Having intermittent rectal pain.  History of spinal cord injury since fall.  Added Linzess  72 mcg daily, continue MiraLAX  daily.  Advised suppository once weekly given CT evidence of infection.  Scheduled for EGD with dilation per patient's request  to evaluate her swallowing and upper abdominal pain.  Omeprazole  increased to 40 mg twice daily from 20 mg twice daily.   EGD 11/19/22: -Gastritis s/p biopsy -Normal duodenum -No esophageal stenosis or stricture -Biopsies negative for H. Pylori, hyperplasia seen consistent with PPI therapy.  -Advised PPI twice daily   KUB 12/30/22: Nonobstructive bowel gas pattern, scattered stool in the colon.   Received notification from the primary NP after her last office visit he reported that her appetite has been okay and her weight has been stable since January and that she was having a daily bowel movement while being on MiraLAX  once a day and Linzess  once daily and she requested that we keep her on MiraLAX  once daily instead of increasing to twice daily.  She also discussed discontinuing ensures given her appetite appeared to be good.   Hospitalization 6/21 for complicated UTI for which she had CT imaging that showed diverticular disease of the left colon with minimal fat stranding adjacent to the descending colon suggestive of mild diverticulitis and mild bladder wall thickening concerning for possible cystitis.  She was treated with Augmentin  for her diverticulitis and was initially treated with Zosyn .  Foley exchanged and she was recommended to have cystoscopy outpatient.  Baseline hemoglobin noted to be 8 and she was given 1 unit PRBC during this hospital admission and her iron studies showed low iron which was also replaced while inpatient and was discharged on oral iron supplementation.   Patient's primary provider Barnie Landy, NP who sees  patient regularly at SNF has reported that all staff states that patient eats the majority of all of her meals without issue and does not have any significant lack of appetite therefore they had discontinued her Ensure supplements previously.  She was recently seen at the SNF by [facility] 11/18 at which time she again reported chronic left-sided abdominal  pain and constipation and requesting to have a bowel movement.  Noted to be eating well without difficulty, had recently celebrated her birthday early.  Given further decline in renal function (CKD stage IV) she was advised to avoid NSAIDs and advised to continue sodium bicarbonate .  Given constipation with left lower quadrant abdominal pain they decided to start senna and discontinue Prilosec.  They will continue daily iron supplementation for anemia.  Last hemoglobin 10 in July 2024 and we will check another CBC.  They have began renally dosing all of her medications as well.  They also decided to discontinue Eliquis given no recent evidence of DVT and history of severe anemia with recurrent hematuria in the past.   Also discussed colonoscopy versus flexible sigmoidoscopy with PCP and she thought that prep would not be feasible for patient however if patient insisted that they would at least attempt this.  She also agreed to increase her Linzess  dose to help with constipation.   A1c improved to 7.1 today.  On morning labs her glucose was noted to be 39, BUN 30, creatinine 1.54 which appears to be around her baseline.   OV November 2024.  Reported that usually make her bottom hurt.  Does have relief of abdominal pain somewhat after bowel movements.  Taking Senokot nightly.  Certain foods make constipation worse.  Denied melena or BRBPR.  Denied any upper GI symptoms although did not eat much on the day of her birthday dinner.  Scheduled for colonoscopy.  Advised Tums as needed for reflux breakthrough symptoms.  Continue Linzess  145 mcg once daily.  Continue Senokot daily for constipation management and iron 3 times weekly.   Colonoscopy January 2025: - Inadequate colon prep - Stool within the rectum and sigmoid colon - No evidence of stercoral colitis or diverticulosis on exam although limited view.   OV 11/04/23 with Gwendolyn Centers, PA.  Reported worsening left lower quadrant pain, having daily bowel  movements.  Also noting some nausea at times.  Sometimes has heartburn but does not want to take any medication.  Daughter believes this is happening more frequently the patient reports.  Usually having heartburn or nausea after breakfast.  She spoke with patient's nurse Orie at the facility who states patient started colon prep but was unsure if she finished it after she had left for her shift.  Patient had 10 bowel movements on 2/28.  Current medications were Senokot nightly, Linzess  as needed with none given the last 14 days.  Patient reported occasional pain but nothing significant on the left side.  Complains of heartburn from time to time.  Eating well.  Repeat CT ordered.  Denies continue nightly Senokot and Linzess  as needed.  If stool burden noted on CT will give more aggressive bowel regimen.  PPI offered however patient declined..   CT A/P 11/05/2023: - No acute findings - Diverticulosis without diverticulitis - Large amount of stool within the rectosigmoid colon with distention of rectum, fecal impaction cannot be excluded - Cholelithiasis versus gallbladder sludge present without cholecystitis - Chronic avascular necrosis of both hips - Recommended Linzess  145 mcg daily, give Dulcolax suppository if stool  impaction noted on rectal exam (relayed these recommendations to nurse at [facility])  Last office visit 12/16/23.  Reportedly having more reflux during the day but also having some occasional nighttime symptoms.  Believes she has got received Mylanta in the past.  Continues to have intermittent nausea but no vomiting.  Was ready to go eat pancakes.  The past weekend she had had terrible constipation had a very hard stool that resulted in rectal discomfort lasting several hours.  This appeared to be consistent with prior CT scan noting possible fecal impaction.  Continue to report left lower quadrant discomfort.  Today:  Daughter present - patient continues to report som Left sided pain.  Having more Bms than usual.  Daughter states that at times it concerns her that she does not want to eat things that she brings her.  No reports from staff about any significant fecal impaction or blood in the stool.  She does state that at times she will have multiple loose runny stools that staff told her about that can be like water .  No reports of any recent significant amounts of nausea or vomiting.  Patient expressing urge to have a bowel movement today in the office.   Current bowel regimen per paperwork from facility:  Linzess  72 mcg every day ordered Senokot + colace 2 tablets once nightly Dulcolax suppository once weekly Last received Linzess   72 mcg yesterday morning 7/30  Last dose Senna last night 7/30 at 8pm  Last dose dulcolax suppository received 7/24  Wt Readings from Last 5 Encounters:  03/30/24 164 lb 8 oz (74.6 kg)  03/22/24 165 lb 12.8 oz (75.2 kg)  03/02/24 166 lb 12.8 oz (75.7 kg)  02/24/24 165 lb 14.4 oz (75.3 kg)  02/21/24 165 lb 1.6 oz (74.9 kg)    Current Outpatient Medications  Medication Sig Dispense Refill   acetaminophen  (TYLENOL ) 325 MG tablet Take 650 mg by mouth 3 (three) times daily.     AMBULATORY NON FORMULARY MEDICATION Medication Name:  Continue monthly catheter changes with 84fr catheter at skilled nursing facility. 1 application MONTHLY   amLODipine (NORVASC) 10 MG tablet Take 10 mg by mouth daily.     bisacodyl  (DULCOLAX) 10 MG suppository Place 10 mg rectally once a week. Thursday     Calcium  Carbonate Antacid (TUMS PO) Take 200 mg by mouth 2 (two) times daily as needed.     calcium -vitamin D  (OSCAL WITH D) 500-5 MG-MCG tablet Take 1 tablet by mouth 2 (two) times daily.     camphor-menthol (SARNA) lotion Apply 1 Application topically 2 (two) times daily as needed for itching.     Continuous Glucose Receiver (FREESTYLE LIBRE 2 READER) DEVI by Does not apply route. Friday     Continuous Glucose Sensor (FREESTYLE LIBRE 2 SENSOR) MISC       dapagliflozin  propanediol (FARXIGA ) 5 MG TABS tablet Take 5 mg by mouth daily.     DULoxetine  (CYMBALTA ) 20 MG capsule Take 20 mg by mouth daily.     famotidine (PEPCID) 20 MG tablet Take 20 mg by mouth in the morning.     ferrous sulfate  325 (65 FE) MG EC tablet Take 325 mg by mouth 3 (three) times a week. Monday, Wednesday, and Friday.     folic acid  (FOLVITE ) 1 MG tablet Take 1 mg by mouth daily.     gabapentin  (NEURONTIN ) 300 MG capsule Take 300 mg by mouth 3 (three) times daily.     insulin  aspart (NOVOLOG  FLEXPEN) 100 UNIT/ML  FlexPen Inject 5 Units into the skin 3 (three) times daily with meals. 8 am, 12 pm, and 6 pm     insulin  glargine (LANTUS  SOLOSTAR) 100 UNIT/ML Solostar Pen Inject 20 Units into the skin at bedtime.     Insulin  Pen Needle 30G X 5 MM MISC 1 Device by Does not apply route daily. 3/16  0   ipratropium-albuterol  (DUONEB) 0.5-2.5 (3) MG/3ML SOLN Take 3 mLs by nebulization every 6 (six) hours as needed (wheezing).     lamoTRIgine  (LAMICTAL ) 150 MG tablet Take 150 mg by mouth 2 (two) times daily.     levETIRAcetam  (KEPPRA ) 500 MG tablet Take 500 mg by mouth 2 (two) times daily. Renal dose     linaclotide  (LINZESS ) 72 MCG capsule Take 72 mcg by mouth daily.     loratadine (CLARITIN) 10 MG tablet Take 10 mg by mouth daily.     LORazepam  (ATIVAN ) 2 MG/ML injection Inject 0.5 mLs (1 mg total) into the muscle every 15 (fifteen) minutes as needed. 4 mL 0   Magnesium  Hydroxide (MILK OF MAGNESIA PO) Take 30 mLs by mouth as needed.     melatonin 5 MG TABS Take 5 mg by mouth at bedtime.     mirtazapine  (REMERON ) 7.5 MG tablet Take 7.5 mg by mouth at bedtime.     ondansetron  (ZOFRAN ) 4 MG/5ML solution Take 4 mg by mouth every 8 (eight) hours as needed for nausea or vomiting.     Phenylephrine -Cocoa Butter 0.25-85.5 % SUPP Place 1 suppository rectally 2 (two) times daily as needed.     rOPINIRole  (REQUIP ) 1 MG tablet Take 1 mg by mouth at bedtime.     rosuvastatin  (CRESTOR ) 20 MG  tablet Take 20 mg by mouth at bedtime.     sennosides-docusate sodium  (SENOKOT-S) 8.6-50 MG tablet Take 1 tablet by mouth daily.     sodium bicarbonate  650 MG tablet Take 1 tablet (650 mg total) by mouth 2 (two) times daily.     zinc oxide 20 % ointment Apply 1 Application topically as needed for irritation (Every Shift PRN).     No current facility-administered medications for this visit.    Past Medical History:  Diagnosis Date   Acute cystitis without hematuria    Adult failure to thrive    Anemia    Anxiety    Atherosclerosis of aorta (HCC)    Central cord syndrome at C4 level of cervical spinal cord, subsequent encounter (HCC)    Chronic kidney disease, stage IV (severe) (HCC)    CKD (chronic kidney disease)    stage 3   Depression    DM type 2 with diabetic peripheral neuropathy (HCC)    Dysphagia    GERD (gastroesophageal reflux disease)    Gout    High cholesterol    HTN (hypertension)    Hyponatremia    MDD (major depressive disorder)    Neck pain    Neuropathy    Quadriplegia, C1-C4 incomplete (HCC)    Radiculopathy    RLS (restless legs syndrome)    Unspecified convulsions (HCC)    Urinary retention     Past Surgical History:  Procedure Laterality Date   ABDOMINAL HYSTERECTOMY     ANTERIOR CERVICAL DECOMP/DISCECTOMY FUSION N/A 02/05/2021   Procedure: Cervical Three-Four  Anterior cervical decompression/discectomy/fusion;  Surgeon: Gillie Duncans, MD;  Location: [facility] OR;  Service: Neurosurgery;  Laterality: N/A;  RM 20   APPENDECTOMY     BACK SURGERY     BALLOON DILATION N/A 11/19/2022  Procedure: BALLOON DILATION;  Surgeon: Fernandez Gwendolyn POUR, [name];  Location: [facility];  Service: Endoscopy;  Laterality: N/A;   BIOPSY  09/22/2021   Procedure: BIOPSY;  Surgeon: Fernandez Gwendolyn POUR, [name];  Location: [facility];  Service: Endoscopy;;   BIOPSY  11/19/2022   Procedure: BIOPSY;  Surgeon: Fernandez Gwendolyn POUR, [name];  Location: [facility];  Service: Endoscopy;;   CERVICAL  DISC SURGERY     ESOPHAGOGASTRODUODENOSCOPY (EGD) WITH PROPOFOL  N/A 09/22/2021   Procedure: ESOPHAGOGASTRODUODENOSCOPY (EGD) WITH PROPOFOL ;  Surgeon: Fernandez Gwendolyn POUR, [name];  Location: [facility];  Service: Endoscopy;  Laterality: N/A;  1:00pm   ESOPHAGOGASTRODUODENOSCOPY (EGD) WITH PROPOFOL  N/A 11/19/2022   Procedure: ESOPHAGOGASTRODUODENOSCOPY (EGD) WITH PROPOFOL ;  Surgeon: Fernandez Gwendolyn POUR, [name];  Location: [facility];  Service: Endoscopy;  Laterality: N/A;  11:30 am, asa 3   FLEXIBLE SIGMOIDOSCOPY  09/20/2023   Procedure: FLEXIBLE SIGMOIDOSCOPY;  Surgeon: Fernandez Gwendolyn POUR, [name];  Location: [facility];  Service: Endoscopy;;   HAND SURGERY     KNEE SURGERY      Family History  Problem Relation Age of Onset   Cancer Mother    Cardiomyopathy Father    Seizures Sister        childhood   Seizures Brother        in his 23s   Colon cancer Neg Hx     Allergies as of 03/30/2024 - Review Complete 03/30/2024  Allergen Reaction Noted   Ace inhibitors Other (See Comments) 08/29/2021   Codeine  03/09/2018   Sulfa antibiotics  03/25/2021    Social History   Socioeconomic History   Marital status: Widowed    Spouse name: Not on file   Number of children: Not on file   Years of education: Not on file   Highest education level: Not on file  Occupational History   Not on file  Tobacco Use   Smoking status: Never   Smokeless tobacco: Never  Vaping Use   Vaping status: Never Used  Substance and Sexual Activity   Alcohol use: Never   Drug use: Never   Sexual activity: Not Currently  Other Topics Concern   Not on file  Social History Narrative   [facility] at [facility]   Social Drivers of Health   Financial Resource Strain: Not on file  Food Insecurity: No Food Insecurity (02/20/2023)   Hunger Vital Sign    Worried About Running Out of Food in the Last Year: Never true    Ran Out of Food in the Last Year: Never true  Transportation Needs: No Transportation Needs  (02/20/2023)   PRAPARE - Administrator, Civil Service (Medical): No    Lack of Transportation (Non-Medical): No  Physical Activity: Not on file  Stress: Not on file  Social Connections: Not on file     Review of Systems   Gen: Denies fever, chills, anorexia. Denies fatigue, weakness, weight loss.  CV: Denies chest pain, palpitations, syncope, peripheral edema, and claudication. Resp: Denies dyspnea at rest, cough, wheezing, coughing up blood, and pleurisy. GI: See HPI Derm: Denies rash, itching, dry skin Psych: Denies depression, anxiety, memory loss, confusion. No homicidal or suicidal ideation.  Heme: Denies bruising, bleeding, and enlarged lymph nodes.  Physical Exam   BP 135/70 (BP Location: Left Arm, Patient Position: Sitting, Cuff Size: Large)   Pulse 60   Temp 97.8 F (36.6 C) (Oral)   Ht 5' 2 (1.575 m)   Wt 164 lb  8 oz (74.6 kg) Comment: facility reported on 03/27/24  SpO2 97%   BMI 30.09 kg/m   General:   Alert and oriented. No distress noted. Pleasant and cooperative.  Head:  Normocephalic and atraumatic. Eyes:  Conjuctiva clear without scleral icterus. Abdomen:  +BS, soft, non-distended. Ttp to LLQ. No rebound or guarding. No HSM or masses noted. Rectal: deferred Msk:  Symmetrical without gross deformities. Normal posture. Extremities:  mild edema to BLE.  Neurologic:  Alert and  oriented. Paraplegic, in wheelchair.  Psych:  Alert and cooperative. Normal mood and affect.  Assessment   Constipation, LLQ pain, history of diverticulitis: - Attempted colonoscopy unsuccessful -Constipation likely secondary to lack of mobility as well as central cord syndrome and IBS - Despite intermittent diarrhea, bowel regimen remains an adequate, recent CT scan with concern for fecal impaction -Currently on Linzess  72 mcg every day along with senna + colace combo 2 tablets nightly and dulcolax suppository once weekly -Left lower quadrant pain likely secondary to  chronic constipation and IBS, possible functional component as well given history.  - Bowel regimen appears to be adequate at this time and pain not severe.   GERD, poor appetite: - History of reflux, remains off PPI.  There was concerns that omeprazole  was making constipation worse previously -Maintained on famotidine 20 mg daily without significant breakthrough symptoms reported.  -Nausea may be secondary to reflux as well as possibly from constipation  PLAN   Continue Linzess  72 mcg daily Continue Senokot+colace 2 tablets nightly Continue famotidine 20 mg once daily GERD diet Continue Dulcolax suppository once weekly Follow up1 year, sooner if needed.     Charmaine Melia, MSN, FNP-BC, AGACNP-BC [facility]

## 2024-04-13 ENCOUNTER — Non-Acute Institutional Stay (SKILLED_NURSING_FACILITY): Admitting: Adult Health

## 2024-04-13 ENCOUNTER — Encounter: Payer: Self-pay | Admitting: Adult Health

## 2024-04-13 DIAGNOSIS — M87051 Idiopathic aseptic necrosis of right femur: Secondary | ICD-10-CM

## 2024-04-13 DIAGNOSIS — K5909 Other constipation: Secondary | ICD-10-CM

## 2024-04-13 DIAGNOSIS — E1142 Type 2 diabetes mellitus with diabetic polyneuropathy: Secondary | ICD-10-CM

## 2024-04-13 DIAGNOSIS — M87052 Idiopathic aseptic necrosis of left femur: Secondary | ICD-10-CM

## 2024-04-13 DIAGNOSIS — M51361 Other intervertebral disc degeneration, lumbar region with lower extremity pain only: Secondary | ICD-10-CM

## 2024-04-13 NOTE — Progress Notes (Signed)
 Location:  [facility] Room Number: 154-W Place of Service:  SNF (31)   CODE STATUS: dnr  Allergies  Allergen Reactions   Ace Inhibitors Other (See Comments)    Hyperkalemia    Codeine     Unknown reaction   Sulfa Antibiotics     Unknown reaction    Chief Complaint  Patient presents with   Medical Management of Chronic Issues       Chronic constipation:  Lumbar radicular syndrome/avascular necrosis bilateral hips: Diabetic peripheral neuropathy:    HPI:  She is a 88 y.o. long term resident of this facility being seen for the management of her chronic illnesses:  Chronic constipation:  Lumbar radicular syndrome/avascular necrosis bilateral hips: Diabetic peripheral neuropathy. There are no reports of uncontrolled pain. Her weight is without significant change. She does get out of bed at times.    Past Medical History:  Diagnosis Date   Acute cystitis without hematuria    Adult failure to thrive    Anemia    Anxiety    Atherosclerosis of aorta (HCC)    Central cord syndrome at C4 level of cervical spinal cord, subsequent encounter (HCC)    Chronic kidney disease, stage IV (severe) (HCC)    CKD (chronic kidney disease)    stage 3   Depression    DM type 2 with diabetic peripheral neuropathy (HCC)    Dysphagia    GERD (gastroesophageal reflux disease)    Gout    High cholesterol    HTN (hypertension)    Hyponatremia    MDD (major depressive disorder)    Neck pain    Neuropathy    Quadriplegia, C1-C4 incomplete (HCC)    Radiculopathy    RLS (restless legs syndrome)    Unspecified convulsions (HCC)    Urinary retention     Past Surgical History:  Procedure Laterality Date   ABDOMINAL HYSTERECTOMY     ANTERIOR CERVICAL DECOMP/DISCECTOMY FUSION N/A 02/05/2021   Procedure: Cervical Three-Four  Anterior cervical decompression/discectomy/fusion;  Surgeon: Gillie Duncans, MD;  Location: [facility] OR;  Service: Neurosurgery;  Laterality: N/A;  RM 20    APPENDECTOMY     BACK SURGERY     BALLOON DILATION N/A 11/19/2022   Procedure: BALLOON DILATION;  Surgeon: Cindie Carlin POUR, DO;  Location: [facility];  Service: Endoscopy;  Laterality: N/A;   BIOPSY  09/22/2021   Procedure: BIOPSY;  Surgeon: Cindie Carlin POUR, DO;  Location: [facility];  Service: Endoscopy;;   BIOPSY  11/19/2022   Procedure: BIOPSY;  Surgeon: Cindie Carlin POUR, DO;  Location: [facility];  Service: Endoscopy;;   CERVICAL DISC SURGERY     ESOPHAGOGASTRODUODENOSCOPY (EGD) WITH PROPOFOL  N/A 09/22/2021   Procedure: ESOPHAGOGASTRODUODENOSCOPY (EGD) WITH PROPOFOL ;  Surgeon: Cindie Carlin POUR, DO;  Location: [facility];  Service: Endoscopy;  Laterality: N/A;  1:00pm   ESOPHAGOGASTRODUODENOSCOPY (EGD) WITH PROPOFOL  N/A 11/19/2022   Procedure: ESOPHAGOGASTRODUODENOSCOPY (EGD) WITH PROPOFOL ;  Surgeon: Cindie Carlin POUR, DO;  Location: [facility];  Service: Endoscopy;  Laterality: N/A;  11:30 am, asa 3   FLEXIBLE SIGMOIDOSCOPY  09/20/2023   Procedure: FLEXIBLE SIGMOIDOSCOPY;  Surgeon: Cindie Carlin POUR, DO;  Location: [facility];  Service: Endoscopy;;   HAND SURGERY     KNEE SURGERY      Social History   Socioeconomic History   Marital status: Widowed    Spouse name: Not on file   Number of children: Not on file   Years  of education: Not on file   Highest education level: Not on file  Occupational History   Not on file  Tobacco Use   Smoking status: Never   Smokeless tobacco: Never  Vaping Use   Vaping status: Never Used  Substance and Sexual Activity   Alcohol use: Never   Drug use: Never   Sexual activity: Not Currently  Other Topics Concern   Not on file  Social History Narrative   L[facility] at [facility]   Social Drivers of Health   Financial Resource Strain: Not on file  Food Insecurity: No Food Insecurity (02/20/2023)   Hunger Vital Sign    Worried About Running Out of Food in the Last Year: Never true    Ran Out of Food in the Last Year:  Never true  Transportation Needs: No Transportation Needs (02/20/2023)   PRAPARE - Administrator, Civil Service (Medical): No    Lack of Transportation (Non-Medical): No  Physical Activity: Not on file  Stress: Not on file  Social Connections: Not on file  Intimate Partner Violence: Not At Risk (02/20/2023)   Humiliation, Afraid, Rape, and Kick questionnaire    Fear of Current or Ex-Partner: No    Emotionally Abused: No    Physically Abused: No    Sexually Abused: No   Family History  Problem Relation Age of Onset   Cancer Mother    Cardiomyopathy Father    Seizures Sister        childhood   Seizures Brother        in his 30s   Colon cancer Neg Hx       VITAL SIGNS BP (!) 149/73   Pulse 85   Temp 97.9 F (36.6 C)   Ht 5' 2 (1.575 m)   Wt 163 lb 8 oz (74.2 kg)   SpO2 97%   BMI 29.90 kg/m   Outpatient Encounter Medications as of 04/13/2024  Medication Sig   acetaminophen  (TYLENOL ) 325 MG tablet Take 650 mg by mouth 3 (three) times daily.   AMBULATORY NON FORMULARY MEDICATION Medication Name:  Continue monthly catheter changes with 66fr catheter at skilled nursing facility.   amLODipine (NORVASC) 10 MG tablet Take 10 mg by mouth daily.   bisacodyl  (DULCOLAX) 10 MG suppository Place 10 mg rectally once a week. Thursday   calcium -vitamin D  (OSCAL WITH D) 500-5 MG-MCG tablet Take 1 tablet by mouth 2 (two) times daily.   Continuous Glucose Receiver (FREESTYLE LIBRE 2 READER) DEVI by Does not apply route. Friday   Continuous Glucose Sensor (FREESTYLE LIBRE 2 SENSOR) MISC    dapagliflozin  propanediol (FARXIGA ) 5 MG TABS tablet Take 5 mg by mouth daily.   DULoxetine  (CYMBALTA ) 20 MG capsule Take 20 mg by mouth daily.   famotidine (PEPCID) 20 MG tablet Take 20 mg by mouth in the morning.   ferrous sulfate  325 (65 FE) MG EC tablet Take 325 mg by mouth 3 (three) times a week. Monday, Wednesday, and Friday.   folic acid  (FOLVITE ) 1 MG tablet Take 1 mg by mouth daily.    gabapentin  (NEURONTIN ) 300 MG capsule Take 300 mg by mouth 2 (two) times daily.   insulin  aspart (NOVOLOG  FLEXPEN) 100 UNIT/ML FlexPen Inject 5 Units into the skin 3 (three) times daily with meals. 8 am, 12 pm, and 6 pm   insulin  glargine (LANTUS  SOLOSTAR) 100 UNIT/ML Solostar Pen Inject 20 Units into the skin at bedtime.   Insulin  Pen Needle 30G X 5 MM  MISC 1 Device by Does not apply route daily. 3/16   lamoTRIgine  (LAMICTAL ) 150 MG tablet Take 150 mg by mouth 2 (two) times daily.   levETIRAcetam  (KEPPRA ) 500 MG tablet Take 500 mg by mouth 2 (two) times daily. Renal dose   linaclotide  (LINZESS ) 72 MCG capsule Take 72 mcg by mouth daily.   loratadine (CLARITIN) 10 MG tablet Take 10 mg by mouth daily.   melatonin 5 MG TABS Take 5 mg by mouth at bedtime.   rOPINIRole  (REQUIP ) 0.5 MG tablet Take 0.5 mg by mouth at bedtime.   rosuvastatin  (CRESTOR ) 20 MG tablet Take 20 mg by mouth at bedtime.   sennosides-docusate sodium  (SENOKOT-S) 8.6-50 MG tablet Take 2 tablets by mouth daily.   sodium bicarbonate  650 MG tablet Take 1 tablet (650 mg total) by mouth 2 (two) times daily.   Calcium  Carbonate Antacid (TUMS PO) Take 200 mg by mouth 2 (two) times daily as needed.   camphor-menthol (SARNA) lotion Apply 1 Application topically 2 (two) times daily as needed for itching.   ipratropium-albuterol  (DUONEB) 0.5-2.5 (3) MG/3ML SOLN Take 3 mLs by nebulization every 6 (six) hours as needed (wheezing).   LORazepam  (ATIVAN ) 2 MG/ML injection Inject 0.5 mLs (1 mg total) into the muscle every 15 (fifteen) minutes as needed.   Magnesium  Hydroxide (MILK OF MAGNESIA PO) Take 30 mLs by mouth as needed.   mirtazapine  (REMERON ) 7.5 MG tablet Take 7.5 mg by mouth at bedtime. (Patient not taking: Reported on 04/13/2024)   ondansetron  (ZOFRAN ) 4 MG/5ML solution Take 4 mg by mouth every 8 (eight) hours as needed for nausea or vomiting.   Phenylephrine -Cocoa Butter 0.25-85.5 % SUPP Place 1 suppository rectally 2 (two) times  daily as needed.   rOPINIRole  (REQUIP ) 1 MG tablet Take 1 mg by mouth at bedtime. (Patient not taking: Reported on 04/13/2024)   zinc oxide 20 % ointment Apply 1 Application topically as needed for irritation (Every Shift PRN).   No facility-administered encounter medications on file as of 04/13/2024.     SIGNIFICANT DIAGNOSTIC EXAMS   LABS REVIEWED PREVIOUS      04-05-23: glucose 215; bun 49; creat 1.88; k+ 4.3; na++ 134; ca 8.4; gfr 26 04-29-23: hgb A1c 10.2   06-24-23: chol 140; ldl 64; trig 190; hdl 38 06-28-23: urine culture: proteus mirabilis: cipro  07-26-23: glucose 39; bun 30; creat 1.54; k+ 4.7; na++ 140; ca 9.0; gfr 33 hgb A1c 7.1  09-21-23: glucose 110 bun 23; creat 1.41; k+ 4.8; na++ 130; ca 9.0; gfr 37; mag 2.0  10-12-23: ACR 1050. 11-09-23: glucose 93; bun 27; creat 1.83; k+ 4.2; na++ 137; ca 9.2; gfr 27 11-22-23: wbc 4.6; hgb 8.5; hct 26.2; mcv 95.3 plt 216; glucose 169; bun 39; creat 1.58; k+ 4.5; na++ 135; ca 9.3 gfr 32; protein 6.8 albumin  3.3; hgb A1c 6.0  urine microalbumin 966.9   TODAY  02-14-24: wbc 4.0; hgb 7.9; hct 24.7; mcv 994.3 plt 186; glucose 103; bun 24; creat 1.75; k+ 4.3; na++ 131; ca 8.5 gfr 28; protein 6.3 albumin  3.0; hgb A1c 6.3; rpr: nr; vitamin B 12: 408; folate 2.9; iron 49; tsh 3.293; chol 98; ldl 40; trig 136; hdl 31   Review of Systems  Constitutional:  Negative for malaise/fatigue.  Respiratory:  Negative for cough and shortness of breath.   Cardiovascular:  Negative for chest pain, palpitations and leg swelling.  Gastrointestinal:  Negative for abdominal pain, constipation and heartburn.  Musculoskeletal:  Negative for back pain, joint pain and myalgias.  Skin: Negative.   Neurological:  Negative for dizziness.  Psychiatric/Behavioral:  The patient is not nervous/anxious.     Physical Exam Constitutional:      General: She is not in acute distress.    Appearance: She is well-developed. She is not diaphoretic.  Neck:     Thyroid : No  thyromegaly.  Cardiovascular:     Rate and Rhythm: Normal rate and regular rhythm.     Pulses: Normal pulses.     Heart sounds: Normal heart sounds.  Pulmonary:     Effort: Pulmonary effort is normal. No respiratory distress.     Breath sounds: Normal breath sounds.  Abdominal:     General: Bowel sounds are normal. There is no distension.     Palpations: Abdomen is soft.     Tenderness: There is no abdominal tenderness.  Genitourinary:    Comments: foley Musculoskeletal:     Cervical back: Neck supple.     Right lower leg: No edema.     Left lower leg: No edema.     Comments:  Limited range of motion in upper extremities Does not move lower extremities    Lymphadenopathy:     Cervical: No cervical adenopathy.  Skin:    General: Skin is warm and dry.  Neurological:     Mental Status: She is alert. Mental status is at baseline.  Psychiatric:        Mood and Affect: Mood normal.     ASSESSMENT/ PLAN:  TODAY  Chronic constipation: will continue miralax  daily and linzess  72 mcg daily and senna s daily  2. Lumbar radicular syndrome/avascular necrosis bilateral hips: will continue tylenol  650 mg every 8 hours; gabapentin  300 mg three times daily and cymbalta  20 mg daily   3. Diabetic peripheral neuropathy: will continue gabapentin  300 mg three times daily    PREVIOUS   4. CKD stage 4 due to type 2 diabetes mellitus: bun 39; creat 1.58 gfr 32   5. Anemia associated with type 2 diabetes mellitus due to underlying condition: hgb 8.5 will continue iron three times weekly    6. Urine retention/neurogenic bladder: has long term foley; is followed by urology  7. Chronic anxiety: is off medications.   8. Protein calorie malnutrition severe: albumin  3.3; protein 6.8; is presently off supplements  9. Aortic atherosclerosis (ct 01-26-21) is on asa and statin  10. Central cord syndrome at C4 level of cervical spine compression subsequent encounter/code compression quadriplegia C1-4  (01-25-21) asa 81 mg daily.  11. Neurocognitive deficits: 03-22-23 SLUMS 14/30  12. Restless leg syndrome: will continue requip  1 mg nightly  13. Hypertension associated with stage 4 chronic kidney disease due to type 2 diabetes mellitus: b/p 135/64 will continue norvasc 10 mg daily   14. Seizure: will continue keppra  500 mg twice daily; will continue lamictal  150 mg twice daily   15. Hyperlipidemia associated with type 2 diabetes mellitus: ldl 73; will continue crestor  20 mg daily   16. Post menopausal osteoporosis t score -3.391  17. Type 2 diabetes mellitus with neuropathy with long term current use of insulin : hgb A1c 6.3; will continue farxiga  5 mg daily (did not tolerate 10 mg) will continue novolog  5 units with meals will lower her lantus  to 20 units nightly will monitor   18. Weight loss: current weight is 163 pounds  19. GERD without esophagitis: is presently off medications.    Barnie Seip NP [facility]   call [phone]

## 2024-04-24 ENCOUNTER — Other Ambulatory Visit: Payer: Self-pay | Admitting: Adult Health

## 2024-04-24 MED ORDER — MIDAZOLAM HCL 10 MG/2ML IJ SOLN: 2.0000 mg | INTRAMUSCULAR | 0 refills | Status: AC | PRN

## 2024-05-09 ENCOUNTER — Encounter: Payer: Self-pay | Admitting: Adult Health

## 2024-05-09 ENCOUNTER — Non-Acute Institutional Stay (SKILLED_NURSING_FACILITY): Payer: Self-pay | Admitting: Adult Health

## 2024-05-09 DIAGNOSIS — R339 Retention of urine, unspecified: Secondary | ICD-10-CM

## 2024-05-09 DIAGNOSIS — D638 Anemia in other chronic diseases classified elsewhere: Secondary | ICD-10-CM

## 2024-05-09 DIAGNOSIS — N184 Chronic kidney disease, stage 4 (severe): Secondary | ICD-10-CM | POA: Diagnosis not present

## 2024-05-09 DIAGNOSIS — N319 Neuromuscular dysfunction of bladder, unspecified: Secondary | ICD-10-CM

## 2024-05-09 DIAGNOSIS — E1122 Type 2 diabetes mellitus with diabetic chronic kidney disease: Secondary | ICD-10-CM

## 2024-05-09 DIAGNOSIS — E0869 Diabetes mellitus due to underlying condition with other specified complication: Secondary | ICD-10-CM

## 2024-05-09 NOTE — Progress Notes (Signed)
 Location:  [facility] Room Number: 154- W Place of Service:  SNF (31)   CODE STATUS: DNR  Allergies  Allergen Reactions   Ace Inhibitors Other (See Comments)    Hyperkalemia    Codeine     Unknown reaction   Sulfa Antibiotics     Unknown reaction    Chief Complaint  Patient presents with   Medical Management of Chronic Issues             CKD stage 4 due to type 2 diabetes mellitus:   Anemia associated with type 2 diabetes mellitus due to underlying condition:    Urine retention/neurogenic bladder    HPI:  She is a 88 y.o. long term resident of this facility being seen for the management of her chronic illnesses:CKD stage 4 due to type 2 diabetes mellitus:   Anemia associated with type 2 diabetes mellitus due to underlying condition:    Urine retention/neurogenic bladder. Her weight is slowly trending down; she has been started on supplements. Her weight is being monitored. There are no reports of uncontrolled pain.    Past Medical History:  Diagnosis Date   Acute cystitis without hematuria    Adult failure to thrive    Anemia    Anxiety    Atherosclerosis of aorta (HCC)    Central cord syndrome at C4 level of cervical spinal cord, subsequent encounter (HCC)    Chronic kidney disease, stage IV (severe) (HCC)    CKD (chronic kidney disease)    stage 3   Depression    DM type 2 with diabetic peripheral neuropathy (HCC)    Dysphagia    GERD (gastroesophageal reflux disease)    Gout    High cholesterol    HTN (hypertension)    Hyponatremia    MDD (major depressive disorder)    Neck pain    Neuropathy    Quadriplegia, C1-C4 incomplete (HCC)    Radiculopathy    RLS (restless legs syndrome)    Unspecified convulsions (HCC)    Urinary retention     Past Surgical History:  Procedure Laterality Date   ABDOMINAL HYSTERECTOMY     ANTERIOR CERVICAL DECOMP/DISCECTOMY FUSION N/A 02/05/2021   Procedure: Cervical Three-Four  Anterior cervical  decompression/discectomy/fusion;  Surgeon: Gillie Duncans, MD;  Location: [facility] OR;  Service: Neurosurgery;  Laterality: N/A;  RM 20   APPENDECTOMY     BACK SURGERY     BALLOON DILATION N/A 11/19/2022   Procedure: BALLOON DILATION;  Surgeon: Cindie Carlin POUR, DO;  Location: [facility];  Service: Endoscopy;  Laterality: N/A;   BIOPSY  09/22/2021   Procedure: BIOPSY;  Surgeon: Cindie Carlin POUR, DO;  Location: [facility];  Service: Endoscopy;;   BIOPSY  11/19/2022   Procedure: BIOPSY;  Surgeon: Cindie Carlin POUR, DO;  Location: [facility];  Service: Endoscopy;;   CERVICAL DISC SURGERY     ESOPHAGOGASTRODUODENOSCOPY (EGD) WITH PROPOFOL  N/A 09/22/2021   Procedure: ESOPHAGOGASTRODUODENOSCOPY (EGD) WITH PROPOFOL ;  Surgeon: Cindie Carlin POUR, DO;  Location: [facility];  Service: Endoscopy;  Laterality: N/A;  1:00pm   ESOPHAGOGASTRODUODENOSCOPY (EGD) WITH PROPOFOL  N/A 11/19/2022   Procedure: ESOPHAGOGASTRODUODENOSCOPY (EGD) WITH PROPOFOL ;  Surgeon: Cindie Carlin POUR, DO;  Location: [facility];  Service: Endoscopy;  Laterality: N/A;  11:30 am, asa 3   FLEXIBLE SIGMOIDOSCOPY  09/20/2023   Procedure: FLEXIBLE SIGMOIDOSCOPY;  Surgeon: Cindie Carlin POUR, DO;  Location: [facility];  Service: Endoscopy;;   HAND SURGERY  KNEE SURGERY      Social History   Socioeconomic History   Marital status: Widowed    Spouse name: Not on file   Number of children: Not on file   Years of education: Not on file   Highest education level: Not on file  Occupational History   Not on file  Tobacco Use   Smoking status: Never   Smokeless tobacco: Never  Vaping Use   Vaping status: Never Used  Substance and Sexual Activity   Alcohol use: Never   Drug use: Never   Sexual activity: Not Currently  Other Topics Concern   Not on file  Social History Narrative   Lives at [facility]   Social Drivers of Health   Financial Resource Strain: Not on file  Food Insecurity: No Food Insecurity  (02/20/2023)   Hunger Vital Sign    Worried About Running Out of Food in the Last Year: Never true    Ran Out of Food in the Last Year: Never true  Transportation Needs: No Transportation Needs (02/20/2023)   PRAPARE - Administrator, Civil Service (Medical): No    Lack of Transportation (Non-Medical): No  Physical Activity: Not on file  Stress: Not on file  Social Connections: Not on file  Intimate Partner Violence: Not At Risk (02/20/2023)   Humiliation, Afraid, Rape, and Kick questionnaire    Fear of Current or Ex-Partner: No    Emotionally Abused: No    Physically Abused: No    Sexually Abused: No   Family History  Problem Relation Age of Onset   Cancer Mother    Cardiomyopathy Father    Seizures Sister        childhood   Seizures Brother        in his 30s   Colon cancer Neg Hx       VITAL SIGNS BP 132/65   Pulse 76   Ht 5' 2 (1.575 m)   Wt 157 lb 12.8 oz (71.6 kg)   BMI 28.86 kg/m   Outpatient Encounter Medications as of 05/09/2024  Medication Sig   acetaminophen  (TYLENOL ) 325 MG tablet Take 650 mg by mouth 3 (three) times daily.   AMBULATORY NON FORMULARY MEDICATION Medication Name:  Continue monthly catheter changes with 83fr catheter at skilled nursing facility.   amLODipine (NORVASC) 10 MG tablet Take 10 mg by mouth daily.   bisacodyl  (DULCOLAX) 10 MG suppository Place 10 mg rectally once a week. Thursday   Calcium  Carbonate Antacid (TUMS PO) Take 200 mg by mouth 2 (two) times daily as needed.   calcium -vitamin D  (OSCAL WITH D) 500-5 MG-MCG tablet Take 1 tablet by mouth 2 (two) times daily.   camphor-menthol (SARNA) lotion Apply 1 Application topically 2 (two) times daily as needed for itching.   Continuous Glucose Receiver (FREESTYLE LIBRE 2 READER) DEVI by Does not apply route. Friday   Continuous Glucose Sensor (FREESTYLE LIBRE 2 SENSOR) MISC    dapagliflozin  propanediol (FARXIGA ) 5 MG TABS tablet Take 5 mg by mouth daily.   DULoxetine  (CYMBALTA )  20 MG capsule Take 20 mg by mouth daily.   famotidine (PEPCID) 20 MG tablet Take 20 mg by mouth in the morning.   feeding supplement (ENSURE PLUS HIGH PROTEIN) LIQD Take 237 mLs by mouth 2 (two) times daily.   ferrous sulfate  325 (65 FE) MG EC tablet Take 325 mg by mouth 3 (three) times a week. Monday, Wednesday, and Friday.   folic acid  (FOLVITE ) 1 MG  tablet Take 1 mg by mouth daily.   gabapentin  (NEURONTIN ) 300 MG capsule Take 300 mg by mouth 2 (two) times daily.   insulin  aspart (NOVOLOG  FLEXPEN) 100 UNIT/ML FlexPen Inject 5 Units into the skin 3 (three) times daily with meals. 8 am, 12 pm, and 6 pm   insulin  glargine (LANTUS  SOLOSTAR) 100 UNIT/ML Solostar Pen Inject 20 Units into the skin at bedtime.   Insulin  Pen Needle 30G X 5 MM MISC 1 Device by Does not apply route daily. 3/16   ipratropium-albuterol  (DUONEB) 0.5-2.5 (3) MG/3ML SOLN Take 3 mLs by nebulization every 6 (six) hours as needed (wheezing).   lamoTRIgine  (LAMICTAL ) 150 MG tablet Take 150 mg by mouth 2 (two) times daily.   levETIRAcetam  (KEPPRA ) 500 MG tablet Take 500 mg by mouth 2 (two) times daily. Renal dose   linaclotide  (LINZESS ) 72 MCG capsule Take 72 mcg by mouth daily.   loratadine (CLARITIN) 10 MG tablet Take 10 mg by mouth daily.   Magnesium  Hydroxide (MILK OF MAGNESIA PO) Take 30 mLs by mouth as needed.   melatonin 5 MG TABS Take 5 mg by mouth at bedtime.   midazolam  (VERSED ) 10 MG/2ML SOLN injection Inject 0.4 mLs (2 mg total) into the muscle every 15 (fifteen) minutes as needed for agitation.   ondansetron  (ZOFRAN ) 4 MG/5ML solution Take 4 mg by mouth every 8 (eight) hours as needed for nausea or vomiting.   Phenylephrine -Cocoa Butter 0.25-85.5 % SUPP Place 1 suppository rectally 2 (two) times daily as needed.   rOPINIRole  (REQUIP ) 0.5 MG tablet Take 0.5 mg by mouth at bedtime.   rosuvastatin  (CRESTOR ) 20 MG tablet Take 20 mg by mouth at bedtime.   sennosides-docusate sodium  (SENOKOT-S) 8.6-50 MG tablet Take 2  tablets by mouth daily.   sodium bicarbonate  650 MG tablet Take 1 tablet (650 mg total) by mouth 2 (two) times daily.   zinc oxide 20 % ointment Apply 1 Application topically as needed for irritation (Every Shift PRN).   mirtazapine  (REMERON ) 7.5 MG tablet Take 7.5 mg by mouth at bedtime. (Patient not taking: Reported on 05/09/2024)   rOPINIRole  (REQUIP ) 1 MG tablet Take 1 mg by mouth at bedtime. (Patient not taking: Reported on 05/09/2024)   No facility-administered encounter medications on file as of 05/09/2024.     SIGNIFICANT DIAGNOSTIC EXAMS   LABS REVIEWED PREVIOUS        06-24-23: chol 140; ldl 64; trig 190; hdl 38 06-28-23: urine culture: proteus mirabilis: cipro  07-26-23: glucose 39; bun 30; creat 1.54; k+ 4.7; na++ 140; ca 9.0; gfr 33 hgb A1c 7.1  09-21-23: glucose 110 bun 23; creat 1.41; k+ 4.8; na++ 130; ca 9.0; gfr 37; mag 2.0  10-12-23: ACR 1050. 11-09-23: glucose 93; bun 27; creat 1.83; k+ 4.2; na++ 137; ca 9.2; gfr 27 11-22-23: wbc 4.6; hgb 8.5; hct 26.2; mcv 95.3 plt 216; glucose 169; bun 39; creat 1.58; k+ 4.5; na++ 135; ca 9.3 gfr 32; protein 6.8 albumin  3.3; hgb A1c 6.0  urine microalbumin 966.9   TODAY  02-14-24: wbc 4.0; hgb 7.9; hct 24.7; mcv 994.3 plt 186; glucose 103; bun 24; creat 1.75; k+ 4.3; na++ 131; ca 8.5 gfr 28; protein 6.3 albumin  3.0; hgb A1c 6.3; rpr: nr; vitamin B 12: 408; folate 2.9; iron 49; tsh 3.293; chol 98; ldl 40; trig 136; hdl 31    Review of Systems  Constitutional:  Negative for malaise/fatigue.  Respiratory:  Negative for cough and shortness of breath.   Cardiovascular:  Negative  for chest pain, palpitations and leg swelling.  Gastrointestinal:  Negative for abdominal pain, constipation and heartburn.  Musculoskeletal:  Negative for back pain, joint pain and myalgias.  Skin: Negative.   Neurological:  Negative for dizziness.  Psychiatric/Behavioral:  The patient is not nervous/anxious.     Physical Exam Constitutional:      General: She is  not in acute distress.    Appearance: She is well-developed. She is not diaphoretic.  Neck:     Thyroid : No thyromegaly.  Cardiovascular:     Rate and Rhythm: Normal rate and regular rhythm.     Heart sounds: Normal heart sounds.  Pulmonary:     Effort: Pulmonary effort is normal. No respiratory distress.     Breath sounds: Normal breath sounds.  Abdominal:     General: Bowel sounds are normal. There is no distension.     Palpations: Abdomen is soft.     Tenderness: There is no abdominal tenderness.  Genitourinary:    Comments: Foley is leaking  Musculoskeletal:     Cervical back: Neck supple.     Right lower leg: No edema.     Left lower leg: No edema.     Comments: Limited range of motion in upper extremities Does not move lower extremities     Lymphadenopathy:     Cervical: No cervical adenopathy.  Skin:    General: Skin is warm and dry.  Neurological:     Mental Status: She is alert. Mental status is at baseline.  Psychiatric:        Mood and Affect: Mood normal.      ASSESSMENT/ PLAN:  TODAY  CKD stage 4 due to type 2 diabetes mellitus: bun 39; creat 1.58; gfr 32  2. Anemia associated with type 2 diabetes mellitus due to underlying condition: hgb 7.9 will continue iron three times weekly  3. Urine retention/neurogenic bladder: has long term foley is followed by urology  PREVIOUS   4. Chronic anxiety: is off medications.   5. Protein calorie malnutrition severe: albumin  3.3; protein 6.8; is presently off supplements  6. Aortic atherosclerosis (ct 01-26-21) is on asa and statin  7. Central cord syndrome at C4 level of cervical spine compression subsequent encounter/code compression quadriplegia C1-4 (01-25-21) asa 81 mg daily.  8. Neurocognitive deficits: 03-22-23 SLUMS 14/30  9. Restless leg syndrome: will continue requip  1 mg nightly  10. Hypertension associated with stage 4 chronic kidney disease due to type 2 diabetes mellitus: b/p 135/64 will continue  norvasc 10 mg daily   11. Seizure: will continue keppra  500 mg twice daily; will continue lamictal  150 mg twice daily   12. Hyperlipidemia associated with type 2 diabetes mellitus: ldl 73; will continue crestor  20 mg daily   13. Post menopausal osteoporosis t score -3.391  14. 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 Weight loss: current weight is 158 pounds  16. GERD without esophagitis: is presently off medications.   17. Chronic constipation: will continue miralax  daily and linzess  72 mcg daily and senna s daily  18. Lumbar radicular syndrome/avascular necrosis bilateral hips: will continue tylenol  650 mg every 8 hours; gabapentin  300 mg three times daily and cymbalta  20 mg daily   19. Diabetic peripheral neuropathy: will continue gabapentin  300 mg three  times daily         Barnie Seip NP [facility]   call [phone]

## 2024-05-11 NOTE — Progress Notes (Unsigned)
 [facility] [address] [place]. [place] [address] [phone]       OFFICE FOLLOW UP NOTE  [name] Date of Birth:  06-Apr-1936 Medical Record Number:  [id number]    Primary neurologist: Dr. Buck Reason for visit: Seizure disorder    SUBJECTIVE: No chief complaint on file.     Brief HPI:   [name] is a 88 y.o. female who is being followed in this office by Dr. Buck for seizure disorder since her teenage years.  She is a permanent resident of [facility] with diagnoses of adult failure to thrive, chronic anemia, arthrosclerosis, cervical cord syndrome with quadriplegia, DM with stage III CKD, HLD, HTN and depression   Interval history:  Update 05/12/2024 JM: Patient returns for follow-up visit.  Denies any recurrent seizure activity since prior visit.  Per SNF MAR, she remains on Keppra  500 mg twice daily and lamotrigine  150 mg twice daily.  She is closely being followed by SNF for adult failure to thrive and protein calorie malnutrition as well as progression of CKD which is now high stage IV as well as progression of anemia.         History provided for reference purposes only Update 08/12/2023 JM: Patient returns for follow-up visit accompanied by her grand daughter.  Was contacted by SNF back in August the patient had recurrent seizure therefore lamotrigine  dosage adjusted.  Then notified by SNF in September of low creatinine clearance and concern of Keppra  dosage at 1000 mg twice daily, it was recommended to gradually increase lamotrigine  dosage gradually to 100 mg twice daily and then gradually decreasing Keppra  dosage to 500 mg twice daily.  Currently taking Keppra  500 mg twice daily and lamotrigine  100 mg twice daily, does have Ativan  IM as needed. Granddaughter provides majority of history, reports improvement of events since dosage adjustments, having possibly about 1/week, previously occurring daily if not  multiple times per day. She notes she will start to have muscle spasms prior to the event or after, sometimes seems like she is in pain prior, at times will have episodes when family is about to leave or if she gets upset.  Patient does have chronic back and hip pain as well as peripheral neuropathy, she is currently on duloxetine , gabapentin , and tizanidine .  Update 04/15/2023 JM: Patient returns for seizure follow-up accompanied by her granddaughter.  At prior visit, lamotrigine  discontinued per patient request and continued on Keppra  1000/1500.   SNF noted seizure in May, lab work showed Keppra  level 80.7, Keppra  lowered to 750mg  twice daily Recurrent seizure in July during Foley exchange at urology visit, evaluated in ED, [facility] no acute abnormality.  Increased Keppra  to 1000 mg twice daily.   Granddaughter reports she has been seen in the ED multiple times since prior visit for recurrent seizures but only able to verify being seen in ED for seizure in July. Also reports recurrent seizure like activity over the past several months when patient sitting up in chair, usually for at least 20-30 min, will black out and become unresponsive. Can be associated at times with extremity jerking or shaking.  She does report this can happen daily, sometime even multiple times per day. Does report left sided mid abdominal pain more recently, granddaughter unsure if associated.  Patient unsure when last bowel movement occurred, unsure if related to abdominal pain.  She is prescribed Linzess  PRN.  She has also had issues with recurrent UTIs, urology wanting to complete cystoscopy  for further evaluation but family wished to wait until seizures under control.  Update 07/16/2022 JM: Patient returns for seizure follow-up visit after prior visit 7 months ago accompanied by her daughter, [name].  She continues to reside at [facility].  Denies any seizure activity since prior visit.  Remains on Keppra  1000/1500 and  lamotrigine  25 mg daily.  She is very eager to try to come off of some of her medications as she feels she takes too many.  Routine lab work at [facility].  No new concerns at this time.  Update 01/07/2022 JM: Returns for 59-month follow-up visit accompanied by her daughter [name].  She continues to reside at [facility]. Per daughter, has had at least 4 seizures since prior visit.  Per EMR review, SNF noted seizure activity on 2/25 requiring dose of IM Ativan .  No specific triggers identified. SNF continued Keppra  1000 mg AM and 1500 mg PM and increased lamotrigine  to 25 mg daily (previously 12.5 mg daily).  Per daughter, additional seizures since that time with most recent last month.  Per SNF med list, she has continued on lamotrigine  25 mg daily and Keppra  1000/1500, denies side effects. She does not wish to further make adjust to any medications as she feels as though she is already taking too many. Completed EEG 2/16 with no epileptiform discharges seen or any other abnormalities.  No further concerns at this time.   History from Dr. Buck 2 medications consult visit on 10/07/2021 copied for reference purposes only [name] is an 88 year old right-handed woman with an underlying complex medical history of chronic kidney disease, central cord syndrome diabetes, gout, reflux disease, hypertension, hyperlipidemia, hyponatremia, depression, anemia, overweight state, anxiety, status post multiple surgeries including neck surgery, low back surgery, knee surgery, hand surgery, hysterectomy, appendectomy, who reports a longstanding history of seizures.  She also reports a family history of seizures affecting her sister who had seizures starting at age 46 years old.  Patient reports that she was on Dilantin in the past.  She had recurrence of seizures approximately last year as I understand. I reviewed your office note from 07/28/2021, at which time she saw Gwendolyn Minus, NP.  Of note, she is on multiple  medications including lorazepam  as needed, atorvastatin , Biotene as needed, chlorthalidone , colchicine, Cymbalta , gabapentin , Humalog, Lantus , melatonin, mirtazapine , omeprazole , Reglan , ropinirole , tizanidine  as needed.  She is on Ventolin  as needed. Per daughter, she does not have any tonic-clonic seizures, her seizures consist of involuntary mouth movements, decreased consciousness, or loss of awareness, lipsmacking, and drawing of her arms upwards above her head. She is wheelchair-bound and since last year has not been able to walk.  She has an indwelling catheter.  Per daughter, she hydrates well with water .  She has limited caffeine, 1 cup of coffee in the mornings typically.  She does not drink any alcohol.  She resides at [facility], skilled nursing facility.   I had evaluated her over 2 years ago at the request of Dr. Carlie in ENT for her balance problems and gait disorder.  She has since then missed 2 appointments.  She has been seen by Dr. Milton in the interim in 2021 but office notes were not available for my review.  She was recently seen in the emergency room on 09/28/2021 with seizures.  She presented from the [facility].  She received multiple doses of Ativan .  I reviewed the emergency room records.  She had a head CT without contrast  on 09/28/2021 and I reviewed the results: Impression: No acute intracranial findings are seen in noncontrast CT brain.  Chronic sinusitis.  Bilateral mastoid effusions.   She had a head CT without contrast on 09/14/2021 with the indication of seizure today, altered mental status.  I reviewed the results: Impression: No acute intracranial abnormality.  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  New left sphenoid sinus air-fluid level, which may be seen with acute sinusitis.  She had a brain MRI with and without contrast on 07/07/2021 with indication of possible seizure.  Abnormal neurological exam.  I reviewed the results: Impression: No acute intracranial finding. No  sign of acute infarction. No lesion seen to explain seizure. The patient does have mild chronic small-vessel ischemic changes considering age affecting the pons, thalami and cerebral hemispheric white matter. Left mastoid effusion. She has been on Keppra  and Lamictal .  The Lamictal  was started in late January 2023 by her primary care.  In the emergency room on 09/28/2021 she was given IV Keppra  1500 mg x 1.  She was advised to continue with Keppra  and Lamictal , Keppra  was increased to 1000 mg in the morning and 1500 mg in the evening.  Urinalysis was nonsuspicious for a UTI.  Laboratory evaluation showed a magnesium  level of 1.8, sodium mildly low at 133, potassium 5.1, glucose 160, BUN 57, creatinine 1.33, CBC showed WBC of 6.2, hemoglobin was low at 9, hematocrit low at 27.9.  Repeat blood sugar 148. She missed interim appointments on appointment on 01/02/20 and 01/30/20.   Previously:    08/17/19: (She) reports problems with her balance for the past 2 years.  She reports that she was diagnosed with diabetes in the 70s and she started noticing occasional problems with walking straight, she would at times severe in one direction.  In the recent past she has had 2 falls.  She was in the home both times.  She fell backwards recently and hit her head against the corner of the furnace she says.  She had bent down to pick something up and when she stood up she overshot.  This happened on 07/02/2019.  On 1123, she fell in the bathroom after standing up from the toilet commode.  She does have occasional lightheadedness when standing up quickly.  She has a 4 pronged cane available and a walker from when she had back and neck surgeries but has not been using either.  She hydrates well with water , estimates that she drinks 3-4 bottles of water  per day and up to 2 bottles per night.  She lives alone, she is widowed, she has  4 grown children.  She has a call alert button.  She is a non-smoker and does not drink alcohol and  does not drink caffeine on a daily basis.  Her latest A1c was either 8 or 9 or 11 per her recollection.  She had blood work through her primary care physician in October.  I reviewed your office note from 07/06/2019.  She denies any recurrent headaches or vertiginous symptoms such as spinning sensation or nausea.  She denies any sudden onset of one-sided weakness or numbness or tingling or droopy face or slurring of speech.  She has had intermittent tingling sensation in her feet and left hand.       ROS:   14 system review of systems performed and negative with exception of those listed in HPI  PMH:  Past Medical History:  Diagnosis Date   Acute cystitis without hematuria  Adult failure to thrive    Anemia    Anxiety    Atherosclerosis of aorta (HCC)    Central cord syndrome at C4 level of cervical spinal cord, subsequent encounter (HCC)    Chronic kidney disease, stage IV (severe) (HCC)    CKD (chronic kidney disease)    stage 3   Depression    DM type 2 with diabetic peripheral neuropathy (HCC)    Dysphagia    GERD (gastroesophageal reflux disease)    Gout    High cholesterol    HTN (hypertension)    Hyponatremia    MDD (major depressive disorder)    Neck pain    Neuropathy    Quadriplegia, C1-C4 incomplete (HCC)    Radiculopathy    RLS (restless legs syndrome)    Unspecified convulsions (HCC)    Urinary retention     PSH:  Past Surgical History:  Procedure Laterality Date   ABDOMINAL HYSTERECTOMY     ANTERIOR CERVICAL DECOMP/DISCECTOMY FUSION N/A 02/05/2021   Procedure: Cervical Three-Four  Anterior cervical decompression/discectomy/fusion;  Surgeon: Gillie Duncans, MD;  Location: [facility] OR;  Service: Neurosurgery;  Laterality: N/A;  RM 20   APPENDECTOMY     BACK SURGERY     BALLOON DILATION N/A 11/19/2022   Procedure: BALLOON DILATION;  Surgeon: Cindie Carlin POUR, DO;  Location: [facility];  Service: Endoscopy;  Laterality: N/A;   BIOPSY  09/22/2021   Procedure:  BIOPSY;  Surgeon: Cindie Carlin POUR, DO;  Location: [facility];  Service: Endoscopy;;   BIOPSY  11/19/2022   Procedure: BIOPSY;  Surgeon: Cindie Carlin POUR, DO;  Location: [facility];  Service: Endoscopy;;   CERVICAL DISC SURGERY     ESOPHAGOGASTRODUODENOSCOPY (EGD) WITH PROPOFOL  N/A 09/22/2021   Procedure: ESOPHAGOGASTRODUODENOSCOPY (EGD) WITH PROPOFOL ;  Surgeon: Cindie Carlin POUR, DO;  Location: [facility];  Service: Endoscopy;  Laterality: N/A;  1:00pm   ESOPHAGOGASTRODUODENOSCOPY (EGD) WITH PROPOFOL  N/A 11/19/2022   Procedure: ESOPHAGOGASTRODUODENOSCOPY (EGD) WITH PROPOFOL ;  Surgeon: Cindie Carlin POUR, DO;  Location: [facility];  Service: Endoscopy;  Laterality: N/A;  11:30 am, asa 3   FLEXIBLE SIGMOIDOSCOPY  09/20/2023   Procedure: FLEXIBLE SIGMOIDOSCOPY;  Surgeon: Cindie Carlin POUR, DO;  Location: [facility];  Service: Endoscopy;;   HAND SURGERY     KNEE SURGERY      Social History:  Social History   Socioeconomic History   Marital status: Widowed    Spouse name: Not on file   Number of children: Not on file   Years of education: Not on file   Highest education level: Not on file  Occupational History   Not on file  Tobacco Use   Smoking status: Never   Smokeless tobacco: Never  Vaping Use   Vaping status: Never Used  Substance and Sexual Activity   Alcohol use: Never   Drug use: Never   Sexual activity: Not Currently  Other Topics Concern   Not on file  Social History Narrative   Lives at [facility]   Social Drivers of Health   Financial Resource Strain: Not on file  Food Insecurity: No Food Insecurity (02/20/2023)   Hunger Vital Sign    Worried About Running Out of Food in the Last Year: Never true    Ran Out of Food in the Last Year: Never true  Transportation Needs: No Transportation Needs (02/20/2023)   PRAPARE - Administrator, Civil Service (Medical): No    Lack of Transportation (  Non-Medical): No  Physical Activity: Not on  file  Stress: Not on file  Social Connections: Not on file  Intimate Partner Violence: Not At Risk (02/20/2023)   Humiliation, Afraid, Rape, and Kick questionnaire    Fear of Current or Ex-Partner: No    Emotionally Abused: No    Physically Abused: No    Sexually Abused: No    Family History:  Family History  Problem Relation Age of Onset   Cancer Mother    Cardiomyopathy Father    Seizures Sister        childhood   Seizures Brother        in his 43s   Colon cancer Neg Hx     Medications:   Current Outpatient Medications on File Prior to Visit  Medication Sig Dispense Refill   acetaminophen  (TYLENOL ) 325 MG tablet Take 650 mg by mouth 3 (three) times daily.     AMBULATORY NON FORMULARY MEDICATION Medication Name:  Continue monthly catheter changes with 73fr catheter at skilled nursing facility. 1 application MONTHLY   amLODipine (NORVASC) 10 MG tablet Take 10 mg by mouth daily.     bisacodyl  (DULCOLAX) 10 MG suppository Place 10 mg rectally once a week. Thursday     Calcium  Carbonate Antacid (TUMS PO) Take 200 mg by mouth 2 (two) times daily as needed.     calcium -vitamin D  (OSCAL WITH D) 500-5 MG-MCG tablet Take 1 tablet by mouth 2 (two) times daily.     camphor-menthol (SARNA) lotion Apply 1 Application topically 2 (two) times daily as needed for itching.     Continuous Glucose Receiver (FREESTYLE LIBRE 2 READER) DEVI by Does not apply route. Friday     Continuous Glucose Sensor (FREESTYLE LIBRE 2 SENSOR) MISC      dapagliflozin  propanediol (FARXIGA ) 5 MG TABS tablet Take 5 mg by mouth daily.     DULoxetine  (CYMBALTA ) 20 MG capsule Take 20 mg by mouth daily.     famotidine (PEPCID) 20 MG tablet Take 20 mg by mouth in the morning.     feeding supplement (ENSURE PLUS HIGH PROTEIN) LIQD Take 237 mLs by mouth 2 (two) times daily.     ferrous sulfate  325 (65 FE) MG EC tablet Take 325 mg by mouth 3 (three) times a week. Monday, Wednesday, and Friday.     folic acid  (FOLVITE ) 1 MG  tablet Take 1 mg by mouth daily.     gabapentin  (NEURONTIN ) 300 MG capsule Take 300 mg by mouth 2 (two) times daily.     insulin  aspart (NOVOLOG  FLEXPEN) 100 UNIT/ML FlexPen Inject 5 Units into the skin 3 (three) times daily with meals. 8 am, 12 pm, and 6 pm     insulin  glargine (LANTUS  SOLOSTAR) 100 UNIT/ML Solostar Pen Inject 20 Units into the skin at bedtime.     Insulin  Pen Needle 30G X 5 MM MISC 1 Device by Does not apply route daily. 3/16  0   ipratropium-albuterol  (DUONEB) 0.5-2.5 (3) MG/3ML SOLN Take 3 mLs by nebulization every 6 (six) hours as needed (wheezing).     lamoTRIgine  (LAMICTAL ) 150 MG tablet Take 150 mg by mouth 2 (two) times daily.     levETIRAcetam  (KEPPRA ) 500 MG tablet Take 500 mg by mouth 2 (two) times daily. Renal dose     linaclotide  (LINZESS ) 72 MCG capsule Take 72 mcg by mouth daily.     loratadine (CLARITIN) 10 MG tablet Take 10 mg by mouth daily.     Magnesium  Hydroxide (MILK OF  MAGNESIA PO) Take 30 mLs by mouth as needed.     melatonin 5 MG TABS Take 5 mg by mouth at bedtime.     midazolam  (VERSED ) 10 MG/2ML SOLN injection Inject 0.4 mLs (2 mg total) into the muscle every 15 (fifteen) minutes as needed for agitation. 8 mL 0   mirtazapine  (REMERON ) 7.5 MG tablet Take 7.5 mg by mouth at bedtime. (Patient not taking: Reported on 05/09/2024)     ondansetron  (ZOFRAN ) 4 MG/5ML solution Take 4 mg by mouth every 8 (eight) hours as needed for nausea or vomiting.     Phenylephrine -Cocoa Butter 0.25-85.5 % SUPP Place 1 suppository rectally 2 (two) times daily as needed.     rOPINIRole  (REQUIP ) 0.5 MG tablet Take 0.5 mg by mouth at bedtime.     rOPINIRole  (REQUIP ) 1 MG tablet Take 1 mg by mouth at bedtime. (Patient not taking: Reported on 05/09/2024)     rosuvastatin  (CRESTOR ) 20 MG tablet Take 20 mg by mouth at bedtime.     sennosides-docusate sodium  (SENOKOT-S) 8.6-50 MG tablet Take 2 tablets by mouth daily.     sodium bicarbonate  650 MG tablet Take 1 tablet (650 mg total) by  mouth 2 (two) times daily.     zinc oxide 20 % ointment Apply 1 Application topically as needed for irritation (Every Shift PRN).     No current facility-administered medications on file prior to visit.    Allergies:   Allergies  Allergen Reactions   Ace Inhibitors Other (See Comments)    Hyperkalemia    Codeine     Unknown reaction   Sulfa Antibiotics     Unknown reaction      OBJECTIVE:  Physical Exam  There were no vitals filed for this visit.  There is no height or weight on file to calculate BMI. No results found.  General: well developed, well nourished, very pleasant elderly female, seated, in no evident distress  Neurologic Exam Mental Status: Awake and fully alert. Oriented to place and time. Recent and remote memory intact. Attention span, concentration and fund of knowledge appropriate. Mood and affect appropriate.  Cranial Nerves: Pupils equal, briskly reactive to light. Extraocular movements full without nystagmus. Visual fields full to confrontation. Hearing intact. Facial sensation intact. Face, tongue, palate moves normally and symmetrically.  Motor: Generalized weakness and muscle atrophy 4/5 BUE and 2/5 BLE Sensory.:  Decreased sensory BLE distally Coordination: Rapid alternating movements slowed. Finger-to-nose performed accurately bilaterally and heel-to-shin unable to perform. Gait and Station: Deferred as patient nonambulatory Reflexes: 1+ and symmetric. Toes downgoing.         ASSESSMENT/PLAN: JAVON HUPFER is a 88 y.o. year old female with.    Seizure disorder:  Reports continued events about 1/week but improvement since adding lamotrigine  as previously occurring daily if not multiple times per day. Do question nonepileptic etiology or behavioral of some of these events especially as previously occurring multiple times a day and can be triggered by certain situations Continue lamotrigine  150 mg twice daily Continue Keppra  500mg  BID - max  dose d/t renal function, CrCl currently 22 If episodes persist, can consider 72-hour EEG to further characterize events EEG 10/16/2021 no epileptiform discharges seen     Follow up in 6 months or call earlier if needed    CC:  PCP: [name], NP    I personally spent a total of *** minutes in the care of the patient today including {Time Based Coding:210964241}.   Harlene Bogaert, AGNP-BC  [facility] [address] [place], [place] [address]  Phone [phone] Fax [phone] Note: This document was prepared with digital dictation and possible smart phrase technology. Any transcriptional errors that result from this process are unintentional.

## 2024-05-12 ENCOUNTER — Encounter: Payer: Self-pay | Admitting: Adult Health

## 2024-05-12 ENCOUNTER — Ambulatory Visit: Admitting: Adult Health

## 2024-05-12 VITALS — BP 113/50 | HR 62 | Ht 62.0 in

## 2024-05-12 DIAGNOSIS — G40909 Epilepsy, unspecified, not intractable, without status epilepticus: Secondary | ICD-10-CM | POA: Diagnosis not present

## 2024-05-12 NOTE — Patient Instructions (Addendum)
 Your Plan:  Continue Keppra  500 mg twice daily - max dose based on kidney function. If kidney function should decline further, may need to further adjust dosage   Continue Lamictal  150 mg twice daily  Does need to have lamotrigine  level checked - request this be completed at facility with her next labs. Thank you!   Questioning if therapy is able to work with her - at least to prevent further muscle weakness/atrophy and prevent risk of contractures overtime. If orders are needed, please let me know!       Follow up in 1 year or call earlier if needed      Thank you for coming to see us  at [facility]. I hope we have been able to provide you high quality care today.  You may receive a patient satisfaction survey over the next few weeks. We would appreciate your feedback and comments so that we may continue to improve ourselves and the health of our patients.

## 2024-05-16 ENCOUNTER — Emergency Department (HOSPITAL_COMMUNITY)

## 2024-05-16 ENCOUNTER — Encounter (HOSPITAL_COMMUNITY): Payer: Self-pay

## 2024-05-16 ENCOUNTER — Telehealth: Payer: Self-pay

## 2024-05-16 ENCOUNTER — Emergency Department (HOSPITAL_COMMUNITY)
Admission: EM | Admit: 2024-05-16 | Discharge: 2024-05-16 | Disposition: A | Discharge status: Skilled Nursing Facility | Attending: Emergency Medicine | Admitting: Emergency Medicine

## 2024-05-16 ENCOUNTER — Other Ambulatory Visit: Payer: Self-pay

## 2024-05-16 ENCOUNTER — Other Ambulatory Visit (HOSPITAL_COMMUNITY)
Admission: RE | Admit: 2024-05-16 | Discharge: 2024-05-16 | Disposition: A | Discharge status: Home / Self Care | Source: Skilled Nursing Facility | Attending: Adult Health | Admitting: Adult Health

## 2024-05-16 DIAGNOSIS — Z79899 Other long term (current) drug therapy: Secondary | ICD-10-CM | POA: Insufficient documentation

## 2024-05-16 DIAGNOSIS — Z794 Long term (current) use of insulin: Secondary | ICD-10-CM | POA: Diagnosis not present

## 2024-05-16 DIAGNOSIS — Z5181 Encounter for therapeutic drug level monitoring: Secondary | ICD-10-CM | POA: Diagnosis not present

## 2024-05-16 DIAGNOSIS — E1122 Type 2 diabetes mellitus with diabetic chronic kidney disease: Secondary | ICD-10-CM | POA: Diagnosis not present

## 2024-05-16 DIAGNOSIS — T83098A Other mechanical complication of other indwelling urethral catheter, initial encounter: Secondary | ICD-10-CM | POA: Insufficient documentation

## 2024-05-16 DIAGNOSIS — N189 Chronic kidney disease, unspecified: Secondary | ICD-10-CM | POA: Diagnosis not present

## 2024-05-16 DIAGNOSIS — R55 Syncope and collapse: Secondary | ICD-10-CM | POA: Insufficient documentation

## 2024-05-16 DIAGNOSIS — Y732 Prosthetic and other implants, materials and accessory gastroenterology and urology devices associated with adverse incidents: Secondary | ICD-10-CM | POA: Insufficient documentation

## 2024-05-16 DIAGNOSIS — I700 Atherosclerosis of aorta: Secondary | ICD-10-CM | POA: Diagnosis not present

## 2024-05-16 DIAGNOSIS — E860 Dehydration: Secondary | ICD-10-CM | POA: Diagnosis not present

## 2024-05-16 DIAGNOSIS — T839XXA Unspecified complication of genitourinary prosthetic device, implant and graft, initial encounter: Secondary | ICD-10-CM

## 2024-05-16 DIAGNOSIS — I129 Hypertensive chronic kidney disease with stage 1 through stage 4 chronic kidney disease, or unspecified chronic kidney disease: Secondary | ICD-10-CM | POA: Diagnosis not present

## 2024-05-16 DIAGNOSIS — N390 Urinary tract infection, site not specified: Secondary | ICD-10-CM

## 2024-05-16 LAB — COMPREHENSIVE METABOLIC PANEL WITH GFR
ALT: 10 U/L (ref 0–44)
AST: 16 U/L (ref 15–41)
Albumin: 3.4 g/dL — ABNORMAL LOW (ref 3.5–5.0)
Alkaline Phosphatase: 63 U/L (ref 38–126)
Anion gap: 14 (ref 5–15)
BUN: 37 mg/dL — ABNORMAL HIGH (ref 8–23)
CO2: 24 mmol/L (ref 22–32)
Calcium: 10 mg/dL (ref 8.9–10.3)
Chloride: 95 mmol/L — ABNORMAL LOW (ref 98–111)
Creatinine, Ser: 2.14 mg/dL — ABNORMAL HIGH (ref 0.44–1.00)
GFR, Estimated: 22 mL/min — ABNORMAL LOW (ref >60)
Glucose, Bld: 124 mg/dL — ABNORMAL HIGH (ref 70–99)
Potassium: 4.3 mmol/L (ref 3.5–5.1)
Sodium: 133 mmol/L — ABNORMAL LOW (ref 135–145)
Total Bilirubin: 0.6 mg/dL (ref 0.0–1.2)
Total Protein: 6.9 g/dL (ref 6.5–8.1)

## 2024-05-16 LAB — URINALYSIS, W/ REFLEX TO CULTURE (INFECTION SUSPECTED)
Bilirubin Urine: NEGATIVE
Glucose, UA: >=500 mg/dL — AB
Hgb urine dipstick: NEGATIVE
Ketones, ur: NEGATIVE mg/dL
Nitrite: NEGATIVE
Protein, ur: 30 mg/dL — AB
Specific Gravity, Urine: 1.002 — ABNORMAL LOW (ref 1.005–1.030)
pH: 7 (ref 5.0–8.0)

## 2024-05-16 LAB — CBC WITH DIFFERENTIAL/PLATELET
Abs Immature Granulocytes: 0.02 K/uL (ref 0.00–0.07)
Basophils Absolute: 0 K/uL (ref 0.0–0.1)
Basophils Relative: 0 %
Eosinophils Absolute: 0.1 K/uL (ref 0.0–0.5)
Eosinophils Relative: 2 %
HCT: 26.5 % — ABNORMAL LOW (ref 36.0–46.0)
Hemoglobin: 8.7 g/dL — ABNORMAL LOW (ref 12.0–15.0)
Immature Granulocytes: 1 %
Lymphocytes Relative: 47 %
Lymphs Abs: 2.1 K/uL (ref 0.7–4.0)
MCH: 31.3 pg (ref 26.0–34.0)
MCHC: 32.8 g/dL (ref 30.0–36.0)
MCV: 95.3 fL (ref 80.0–100.0)
Monocytes Absolute: 0.3 K/uL (ref 0.1–1.0)
Monocytes Relative: 7 %
Neutro Abs: 1.9 K/uL (ref 1.7–7.7)
Neutrophils Relative %: 43 %
Platelets: 218 K/uL (ref 150–400)
RBC: 2.78 MIL/uL — ABNORMAL LOW (ref 3.87–5.11)
RDW: 14.3 % (ref 11.5–15.5)
WBC: 4.3 K/uL (ref 4.0–10.5)
nRBC: 0 % (ref 0.0–0.2)

## 2024-05-16 LAB — TROPONIN I (HIGH SENSITIVITY)
Troponin I (High Sensitivity): 9 ng/L (ref <18)
Troponin I (High Sensitivity): 8 ng/L (ref <18)

## 2024-05-16 MED ORDER — SODIUM CHLORIDE 0.9 % IV BOLUS
1000.0000 mL | Freq: Once | INTRAVENOUS | Status: AC
  Administered 2024-05-16: 1000 mL via INTRAVENOUS

## 2024-05-16 MED ORDER — CEPHALEXIN 500 MG PO CAPS
500.0000 mg | ORAL_CAPSULE | Freq: Four times a day (QID) | ORAL | 0 refills | Status: DC
Start: 2024-05-16 — End: 2024-07-14

## 2024-05-16 MED ORDER — CEFTRIAXONE SODIUM 1 G IJ SOLR
1.0000 g | Freq: Once | INTRAMUSCULAR | Status: AC
  Administered 2024-05-16: 1 g via INTRAVENOUS
  Filled 2024-05-16: qty 10

## 2024-05-16 NOTE — Telephone Encounter (Signed)
 Return call to [facility]. [facility] has been coming out for over a week and the NP advised that pt been seen by a urologist. [facility] has been changing pt cath and the cath has been replaced after cath came out. [facility] pt 20 f cath in but is not sure home much water  is in the balloon, think maybe 15 cc. [facility] is aware a message will be sent to Dr. Nieves on increase water  for balloon to 30 cc to see if this will help with the cath not coming out or leaking. [facility] is made aware a message will be sent to the provider on advisement. Verbalized understanding

## 2024-05-16 NOTE — ED Provider Notes (Signed)
 [place] EMERGENCY DEPARTMENT AT [facility] Provider Note   CSN: [id number] Arrival date & time: 05/16/24  1324     Patient presents with: Urinary Retention and Episode of unconciousness   [name] is a 88 y.o. female.   Pt is a 88 yo female with pmhx significant for hld, dm2, htn, depression, central cord syndrome (c1-c4), ckd, and neurogenic bladder requiring foley.  Pt's family said pt's catheter has not been draining well.  They are concerned for uti.  Pt also had an episode of syncope earlier today.  Family said pt has not been eating/drinking well.       Prior to Admission medications   Medication Sig Start Date End Date Taking? Authorizing Provider  acetaminophen  (TYLENOL ) 325 MG tablet Take 650 mg by mouth 3 (three) times daily.    [provider]  AMBULATORY NON FORMULARY MEDICATION Medication Name:  Continue monthly catheter changes with 102fr catheter at skilled nursing facility. 08/12/21   McKenzie, Belvie CROME, MD  amLODipine (NORVASC) 10 MG tablet Take 10 mg by mouth daily.    [provider]  bisacodyl  (DULCOLAX) 10 MG suppository Place 10 mg rectally once a week. Thursday    [provider]  Calcium  Carbonate Antacid (TUMS PO) Take 200 mg by mouth 2 (two) times daily as needed.    [provider]  calcium -vitamin D  (OSCAL WITH D) 500-5 MG-MCG tablet Take 1 tablet by mouth 2 (two) times daily.    [provider]  Continuous Glucose Receiver (FREESTYLE LIBRE 2 READER) DEVI by Does not apply route. Friday    [provider]  Continuous Glucose Sensor (FREESTYLE LIBRE 2 SENSOR) MISC  02/22/24   [provider]  dapagliflozin  propanediol (FARXIGA ) 5 MG TABS tablet Take 5 mg by mouth daily.    [provider]  DULoxetine  (CYMBALTA ) 20 MG capsule Take 20 mg by mouth daily.    [provider]  famotidine (PEPCID) 20 MG tablet Take 20 mg by mouth in the morning.    [provider]  feeding supplement (ENSURE PLUS HIGH PROTEIN) LIQD Take 237 mLs by mouth 2 (two) times daily.    [provider]  ferrous sulfate  325 (65 FE) MG EC tablet Take 325 mg by mouth 3 (three) times a week. Monday, Wednesday, and Friday.    [provider]  folic acid  (FOLVITE ) 1 MG tablet Take 1 mg by mouth daily.    [provider]  gabapentin  (NEURONTIN ) 300 MG capsule Take 300 mg by mouth 2 (two) times daily.    [provider]  insulin  aspart (NOVOLOG  FLEXPEN) 100 UNIT/ML FlexPen Inject 5 Units into the skin 3 (three) times daily with meals. 8 am, 12 pm, and 6 pm    [provider]  insulin  glargine (LANTUS  SOLOSTAR) 100 UNIT/ML Solostar Pen Inject 20 Units into the skin at bedtime.    [provider]  Insulin  Pen Needle 30G X 5 MM MISC 1 Device by Does not apply route daily. 3/16    Gwendolyn Barnie RAMAN, NP  ipratropium-albuterol  (DUONEB) 0.5-2.5 (3) MG/3ML SOLN Take 3 mLs by nebulization every 6 (six) hours as needed (wheezing).    [provider]  lamoTRIgine  (LAMICTAL ) 150 MG tablet Take 150 mg by mouth 2 (two) times daily.    [provider]  levETIRAcetam  (KEPPRA ) 500 MG tablet Take 500 mg by mouth 2 (two) times daily. Renal dose    [provider]  linaclotide  (LINZESS )  72 MCG capsule Take 72 mcg by mouth daily.    [provider]  LOKELMA  5 g packet Take 1 packet by mouth daily. Patient not taking: Reported on 05/16/2024 03/23/24   [provider]  loratadine (CLARITIN) 10 MG tablet Take 10 mg by mouth daily.    [provider]  Magnesium  Hydroxide (MILK OF MAGNESIA PO) Take 30 mLs by mouth as needed.    [provider]  melatonin 5 MG TABS Take 5 mg by mouth at bedtime.    [provider]  midazolam  (VERSED ) 10 MG/2ML SOLN injection Inject 0.4 mLs (2 mg total) into the muscle every 15 (fifteen) minutes as needed for agitation. 04/24/24   Gwendolyn Barnie RAMAN, NP   mirtazapine  (REMERON ) 15 MG tablet Take 7.5 mg by mouth daily. 02/22/24   [provider]  ondansetron  (ZOFRAN ) 4 MG/5ML solution Take 4 mg by mouth every 8 (eight) hours as needed for nausea or vomiting.    [provider]  Phenylephrine -Cocoa Butter 0.25-85.5 % SUPP Place 1 suppository rectally 2 (two) times daily as needed.    [provider]  rOPINIRole  (REQUIP ) 0.5 MG tablet Take 0.5 mg by mouth at bedtime.    [provider]  rosuvastatin  (CRESTOR ) 20 MG tablet Take 20 mg by mouth at bedtime.    [provider]  sennosides-docusate sodium  (SENOKOT-S) 8.6-50 MG tablet Take 2 tablets by mouth daily.    [provider]  sodium bicarbonate  650 MG tablet Take 1 tablet (650 mg total) by mouth 2 (two) times daily. 02/24/23   Fernandez, Amrit, MD  zinc oxide 20 % ointment Apply 1 Application topically as needed for irritation (Every Shift PRN).    [provider]    Allergies: Ace inhibitors, Codeine, and Sulfa antibiotics    Review of Systems  Constitutional:  Positive for appetite change.  All other systems reviewed and are negative.   Updated Vital Signs BP (!) 128/46   Pulse 67   Temp 98.8 F (37.1 C) (Oral)   Resp 16   Ht 5' 2 (1.575 m)   Wt 72 kg   SpO2 97%   BMI 29.04 kg/m   Physical Exam Vitals and nursing note reviewed.  Constitutional:      Appearance: Normal appearance.  HENT:     Head: Normocephalic and atraumatic.     Right Ear: External ear normal.     Left Ear: External ear normal.     Nose: Nose normal.     Mouth/Throat:     Mouth: Mucous membranes are moist.     Pharynx: Oropharynx is clear.  Eyes:     Extraocular Movements: Extraocular movements intact.     Conjunctiva/sclera: Conjunctivae normal.     Pupils: Pupils are equal, round, and reactive to light.  Cardiovascular:     Rate and Rhythm: Normal rate and regular rhythm.     Pulses: Normal pulses.     Heart sounds: Normal heart sounds.   Pulmonary:     Effort: Pulmonary effort is normal.     Breath sounds: Normal breath sounds.  Abdominal:     General: Abdomen is flat. Bowel sounds are normal.     Palpations: Abdomen is soft.  Musculoskeletal:        General: Normal range of motion.     Cervical back: Normal range of motion and neck supple.  Skin:    General: Skin is warm.     Capillary Refill: Capillary refill takes less than  2 seconds.  Neurological:     Mental Status: She is alert and oriented to person, place, and time. Mental status is at baseline.  Psychiatric:        Mood and Affect: Mood normal.     (all labs ordered are listed, but only abnormal results are displayed) Labs Reviewed  CBC WITH DIFFERENTIAL/PLATELET - Abnormal; Notable for the following components:      Result Value   RBC 2.78 (*)    Hemoglobin 8.7 (*)    HCT 26.5 (*)    All other components within normal limits  COMPREHENSIVE METABOLIC PANEL WITH GFR - Abnormal; Notable for the following components:   Sodium 133 (*)    Chloride 95 (*)    Glucose, Bld 124 (*)    BUN 37 (*)    Creatinine, Ser 2.14 (*)    Albumin  3.4 (*)    GFR, Estimated 22 (*)    All other components within normal limits  URINALYSIS, W/ REFLEX TO CULTURE (INFECTION SUSPECTED)  CBG MONITORING, ED  TROPONIN I (HIGH SENSITIVITY)    EKG: None  Radiology: DG Chest Port 1 View Result Date: 05/16/2024 EXAM: 1 VIEW XRAY OF THE CHEST 05/16/2024 02:56:00 PM COMPARISON: 10/05/2022 CLINICAL HISTORY: Syncope. Table formatting from the original note was not included. Images from the original note were not included. Per triage; Patient BIB [facility] from [facility]. For decreased output from foley catheter, Staff at [facility] report to EMS that patient has had episode of unconsciousness. A\T\O x1, with confusion, and has pressure sore to buttocks. FINDINGS: LUNGS AND PLEURA: No focal pulmonary opacity. No pulmonary edema. No pleural effusion. No pneumothorax. HEART AND  MEDIASTINUM: No acute abnormality of the cardiac and mediastinal silhouettes. Aortic calcification. BONES AND SOFT TISSUES: No acute osseous abnormality. Cervical spine hardware noted. IMPRESSION: 1. No acute findings. Electronically signed by: Norman Gatlin MD 05/16/2024 03:07 PM EDT RP Workstation: [id number]     Procedures   Medications Ordered in the ED  sodium chloride  0.9 % bolus 1,000 mL (1,000 mLs Intravenous New Bag/Given 05/16/24 1539)                                    Medical Decision Making Amount and/or Complexity of Data Reviewed Labs: ordered. Radiology: ordered.   This patient presents to the ED for concern of syncope, this involves an extensive number of treatment options, and is a complaint that carries with it a high risk of complications and morbidity.  The differential diagnosis includes dehydration, infection, electrolyte abn   Co morbidities that complicate the patient evaluation  ld, dm2, htn, depression, central cord syndrome (c1-c4), ckd, and neurogenic bladder requiring foley   Additional history obtained:  Additional history obtained from epic chart review External records from outside source obtained and reviewed including EMS report   Lab Tests:  I Ordered, and personally interpreted labs.  The pertinent results include:  cbc with hgb low at 8.7 (stable); cmp with bun 37 and cr 2.14 (cr 2.07 a month ago)   Imaging Studies ordered:  I ordered imaging studies including cxr  I independently visualized and interpreted imaging which showed No acute findings.  I agree with the radiologist interpretation   Cardiac Monitoring:  The patient was maintained on a cardiac monitor.  I personally viewed and interpreted the cardiac monitored which showed an underlying rhythm of: nsr   Medicines ordered and prescription drug  management:  I ordered medication including ivfs  for sx  Reevaluation of the patient after these medicines showed that the  patient improved I have reviewed the patients home medicines and have made adjustments as needed   Test Considered:  ct   Critical Interventions:  ivfs   Problem List / ED Course:  Syncope:  pt has had poor oral intake.  She is given IVFs.  FC will be changed and urine sent.  Pt signed out at shift change.   Reevaluation:  After the interventions noted above, I reevaluated the patient and found that they have :improved   Social Determinants of Health:  Lives in snf   Dispostion:  Pending at shift change.     Final diagnoses:  Dehydration  Syncope, unspecified syncope type  Foley catheter problem, initial encounter [facility])    ED Discharge Orders     None          Dean Clarity, MD 05/16/24 [phone]

## 2024-05-16 NOTE — ED Triage Notes (Signed)
 Patient BIB [facility] from [facility] center. For decreased output from foley catheter, Staff at [facility] report to EMS that patient has had episode of unconsciousness. A&O x1, with confusion, and has pressure sore to buttocks .

## 2024-05-16 NOTE — Telephone Encounter (Signed)
 Patient's cath is coming out and leaking. Needing a soon appointment.   Call:   [phone] [facility]

## 2024-05-16 NOTE — ED Notes (Signed)
 ED Provider at bedside.

## 2024-05-16 NOTE — Discharge Instructions (Addendum)
 You look a little dehydrated but have given some fluids.  Also may have a urinary tract infection.  Cultures been sent.  You have been given a dose of Rocephin  here and a prescription for Keflex .

## 2024-05-17 LAB — LAMOTRIGINE LEVEL: Lamotrigine Lvl: 9.5 ug/mL (ref 2.0–20.0)

## 2024-05-18 NOTE — Telephone Encounter (Signed)
 Per Dr. Nieves Check with Dr. Sherrilee on this - thanks

## 2024-05-20 LAB — URINE CULTURE: Culture: >=100000 — AB

## 2024-05-21 ENCOUNTER — Telehealth (HOSPITAL_BASED_OUTPATIENT_CLINIC_OR_DEPARTMENT_OTHER): Payer: Self-pay | Admitting: *Deleted

## 2024-05-21 NOTE — Progress Notes (Signed)
 ED Antimicrobial Stewardship Positive Culture Follow Up   [name] is an 88 y.o. female who presented to [facility] on 05/16/2024 with a chief complaint of  Chief Complaint  Patient presents with   Urinary Retention   Episode of unconciousness    Recent Results (from the past 720 hours)  Urine Culture     Status: Abnormal   Collection Time: 05/16/24  5:10 PM   Specimen: Urine, Random  Result Value Ref Range Status   Specimen Description   Final    URINE, RANDOM Performed at [facility], [address] [place], [place] [address]    Special Requests   Final    NONE Reflexed from [phone] Performed at [facility], [address] [place], [place] [address]    Culture (A)  Final    >=100,000 COLONIES/mL PROVIDENCIA STUARTII >=100,000 COLONIES/mL PROTEUS MIRABILIS    Report Status 05/20/2024 FINAL  Final   Organism ID, Bacteria PROVIDENCIA STUARTII (A)  Final   Organism ID, Bacteria PROTEUS MIRABILIS (A)  Final      Susceptibility   Proteus mirabilis - MIC*    AMPICILLIN <=2 SENSITIVE Sensitive     CEFAZOLIN  (URINE) Value in next row Sensitive      4 SENSITIVEThis is a modified FDA-approved test that has been validated and its performance characteristics determined by the reporting laboratory.  This laboratory is certified under the Clinical Laboratory Improvement Amendments CLIA as qualified to perform high complexity clinical laboratory testing.    CEFEPIME Value in next row Sensitive      4 SENSITIVEThis is a modified FDA-approved test that has been validated and its performance characteristics determined by the reporting laboratory.  This laboratory is certified under the Clinical Laboratory Improvement Amendments CLIA as qualified to perform high complexity clinical laboratory testing.    ERTAPENEM Value in next row Sensitive      4 SENSITIVEThis is a modified FDA-approved test that has been validated and its performance characteristics determined by the reporting  laboratory.  This laboratory is certified under the Clinical Laboratory Improvement Amendments CLIA as qualified to perform high complexity clinical laboratory testing.    CEFTRIAXONE  Value in next row Sensitive      4 SENSITIVEThis is a modified FDA-approved test that has been validated and its performance characteristics determined by the reporting laboratory.  This laboratory is certified under the Clinical Laboratory Improvement Amendments CLIA as qualified to perform high complexity clinical laboratory testing.    CIPROFLOXACIN Value in next row Sensitive      4 SENSITIVEThis is a modified FDA-approved test that has been validated and its performance characteristics determined by the reporting laboratory.  This laboratory is certified under the Clinical Laboratory Improvement Amendments CLIA as qualified to perform high complexity clinical laboratory testing.    GENTAMICIN Value in next row Sensitive      4 SENSITIVEThis is a modified FDA-approved test that has been validated and its performance characteristics determined by the reporting laboratory.  This laboratory is certified under the Clinical Laboratory Improvement Amendments CLIA as qualified to perform high complexity clinical laboratory testing.    NITROFURANTOIN Value in next row Resistant      4 SENSITIVEThis is a modified FDA-approved test that has been validated and its performance characteristics determined by the reporting laboratory.  This laboratory is certified under the Clinical Laboratory Improvement Amendments CLIA as qualified to perform high complexity clinical laboratory testing.    TRIMETH/SULFA Value in next row Sensitive  4 SENSITIVEThis is a modified FDA-approved test that has been validated and its performance characteristics determined by the reporting laboratory.  This laboratory is certified under the Clinical Laboratory Improvement Amendments CLIA as qualified to perform high complexity clinical laboratory testing.     AMPICILLIN/SULBACTAM Value in next row Sensitive      4 SENSITIVEThis is a modified FDA-approved test that has been validated and its performance characteristics determined by the reporting laboratory.  This laboratory is certified under the Clinical Laboratory Improvement Amendments CLIA as qualified to perform high complexity clinical laboratory testing.    PIP/TAZO Value in next row Sensitive      <=4 SENSITIVEThis is a modified FDA-approved test that has been validated and its performance characteristics determined by the reporting laboratory.  This laboratory is certified under the Clinical Laboratory Improvement Amendments CLIA as qualified to perform high complexity clinical laboratory testing.    MEROPENEM Value in next row Sensitive      <=4 SENSITIVEThis is a modified FDA-approved test that has been validated and its performance characteristics determined by the reporting laboratory.  This laboratory is certified under the Clinical Laboratory Improvement Amendments CLIA as qualified to perform high complexity clinical laboratory testing.    * >=100,000 COLONIES/mL PROTEUS MIRABILIS   Providencia stuartii - MIC*    AMPICILLIN Value in next row Resistant      <=4 SENSITIVEThis is a modified FDA-approved test that has been validated and its performance characteristics determined by the reporting laboratory.  This laboratory is certified under the Clinical Laboratory Improvement Amendments CLIA as qualified to perform high complexity clinical laboratory testing.    CEFEPIME Value in next row Sensitive      <=4 SENSITIVEThis is a modified FDA-approved test that has been validated and its performance characteristics determined by the reporting laboratory.  This laboratory is certified under the Clinical Laboratory Improvement Amendments CLIA as qualified to perform high complexity clinical laboratory testing.    ERTAPENEM Value in next row Sensitive      <=4 SENSITIVEThis is a modified  FDA-approved test that has been validated and its performance characteristics determined by the reporting laboratory.  This laboratory is certified under the Clinical Laboratory Improvement Amendments CLIA as qualified to perform high complexity clinical laboratory testing.    CEFTRIAXONE  Value in next row Sensitive      <=4 SENSITIVEThis is a modified FDA-approved test that has been validated and its performance characteristics determined by the reporting laboratory.  This laboratory is certified under the Clinical Laboratory Improvement Amendments CLIA as qualified to perform high complexity clinical laboratory testing.    CIPROFLOXACIN Value in next row Resistant      <=4 SENSITIVEThis is a modified FDA-approved test that has been validated and its performance characteristics determined by the reporting laboratory.  This laboratory is certified under the Clinical Laboratory Improvement Amendments CLIA as qualified to perform high complexity clinical laboratory testing.    GENTAMICIN Value in next row Resistant      <=4 SENSITIVEThis is a modified FDA-approved test that has been validated and its performance characteristics determined by the reporting laboratory.  This laboratory is certified under the Clinical Laboratory Improvement Amendments CLIA as qualified to perform high complexity clinical laboratory testing.    NITROFURANTOIN Value in next row Resistant      <=4 SENSITIVEThis is a modified FDA-approved test that has been validated and its performance characteristics determined by the reporting laboratory.  This laboratory is certified under the Clinical Laboratory Improvement Amendments CLIA as qualified  to perform high complexity clinical laboratory testing.    TRIMETH/SULFA Value in next row Sensitive      <=4 SENSITIVEThis is a modified FDA-approved test that has been validated and its performance characteristics determined by the reporting laboratory.  This laboratory is certified under the  Clinical Laboratory Improvement Amendments CLIA as qualified to perform high complexity clinical laboratory testing.    AMPICILLIN/SULBACTAM Value in next row Intermediate      <=4 SENSITIVEThis is a modified FDA-approved test that has been validated and its performance characteristics determined by the reporting laboratory.  This laboratory is certified under the Clinical Laboratory Improvement Amendments CLIA as qualified to perform high complexity clinical laboratory testing.    PIP/TAZO Value in next row Sensitive      <=4 SENSITIVEThis is a modified FDA-approved test that has been validated and its performance characteristics determined by the reporting laboratory.  This laboratory is certified under the Clinical Laboratory Improvement Amendments CLIA as qualified to perform high complexity clinical laboratory testing.    MEROPENEM Value in next row Sensitive      <=4 SENSITIVEThis is a modified FDA-approved test that has been validated and its performance characteristics determined by the reporting laboratory.  This laboratory is certified under the Clinical Laboratory Improvement Amendments CLIA as qualified to perform high complexity clinical laboratory testing.    * >=100,000 COLONIES/mL PROVIDENCIA STUARTII    [x]  Treated with Keflex , organism resistant to prescribed antimicrobial []  Patient discharged originally without antimicrobial agent and treatment is now indicated  New antibiotic prescription: Cefpodoxime  ED Provider: Darice Showers, PA-C   Dorn Poot 05/21/2024, 10:17 AM Clinical Pharmacist Monday - Friday phone -  [phone] Saturday - Sunday phone - [phone]

## 2024-05-21 NOTE — Telephone Encounter (Signed)
 Post ED Visit - Positive Culture Follow-up: Successful Patient Follow-Up  Culture assessed and recommendations reviewed by:  []  Rankin Dee, Pharm.D. []  Venetia Gully, Pharm.D., BCPS AQ-ID []  Garrel Crews, Pharm.D., BCPS []  Almarie Lunger, [address].D., BCPS []  [place], [place].D., BCPS, AAHIVP []  Rosaline Bihari, Pharm.D., BCPS, AAHIVP []  Vernell Meier, PharmD, BCPS []  Latanya Hint, PharmD, BCPS []  Donald Medley, PharmD, BCPS [x]  Dorn Poot, PharmD  Positive urine culture  []  Patient discharged without antimicrobial prescription and treatment is now indicated [x]  Organism is resistant to prescribed ED discharge antimicrobial []  Patient with positive blood cultures  Changes discussed with ED provider: Darice Showers, PA New antibiotic prescription Cefpodoxime 100mg  once daily x 5 days  Spoke to [facility], [place], [place]. Faxed culture report to facility  Contacted facility, date 05/21/24, time 1459   Albino Alan Novak 05/21/2024, 2:58 PM

## 2024-05-23 NOTE — Telephone Encounter (Signed)
 Andres, RN made aware of verbal order from Dr. Sherrilee. Verbalized understanding

## 2024-05-23 NOTE — Telephone Encounter (Signed)
 Tried  calling the [facility] to relay message from MD without an answer Per Dr. Sherrilee Yes increase it to 30cc in the balloon.

## 2024-05-31 ENCOUNTER — Encounter: Payer: Self-pay | Admitting: Adult Health

## 2024-05-31 ENCOUNTER — Non-Acute Institutional Stay (SKILLED_NURSING_FACILITY): Admitting: Adult Health

## 2024-05-31 DIAGNOSIS — Z0000 Encounter for general adult medical examination without abnormal findings: Secondary | ICD-10-CM | POA: Diagnosis not present

## 2024-05-31 NOTE — Progress Notes (Signed)
 Subjective:   [name] is a 88 y.o. female who presents for Medicare Annual (Subsequent) preventive examination.  Visit Complete: In person  Patient Medicare AWV questionnaire was completed by the patient on 05-31-2024; I have confirmed that all information answered by patient is correct and no changes since this date.  Cardiac Risk Factors include: advanced age (>58men, >6 women);diabetes mellitus;hypertension;sedentary lifestyle     Objective:    Today's Vitals   05/31/24 1122  BP: (!) 145/73  Pulse: 70  Resp: 20  Temp: (!) 97.3 F (36.3 C)  SpO2: 97%  Weight: 158 lb 6.4 oz (71.8 kg)  Height: 5' 2 (1.575 m)   Body mass index is 28.97 kg/m.     05/16/2024    1:59 PM 03/02/2024    8:36 AM 01/19/2024    1:51 PM 09/30/2023    2:35 PM 09/20/2023    7:54 AM 06/11/2023    1:35 PM 04/28/2023    1:14 PM  Advanced Directives  Does Patient Have a Medical Advance Directive? Yes Yes Yes Yes No Yes Yes  Type of Advance Directive Healthcare Power of Attorney Out of facility DNR (pink MOST or yellow form) Out of facility DNR (pink MOST or yellow form) Out of facility DNR (pink MOST or yellow form)  Out of facility DNR (pink MOST or yellow form) Out of facility DNR (pink MOST or yellow form)  Does patient want to make changes to medical advance directive? No - Patient declined No - Patient declined No - Patient declined No - Patient declined  No - Patient declined No - Patient declined  Copy of Healthcare Power of Attorney in Chart? No - copy requested        Would patient like information on creating a medical advance directive?    No - Guardian declined No - Patient declined    Pre-existing out of facility DNR order (yellow form or pink MOST form)    Yellow form placed in chart (order not valid for inpatient use)  Yellow form placed in chart (order not valid for inpatient use) Yellow form placed in chart (order not valid for inpatient use)    Current Medications  (verified) Outpatient Encounter Medications as of 05/31/2024  Medication Sig   acetaminophen  (TYLENOL ) 325 MG tablet Take 650 mg by mouth 3 (three) times daily.   AMBULATORY NON FORMULARY MEDICATION Medication Name:  Continue monthly catheter changes with 51fr catheter at skilled nursing facility.   amLODipine (NORVASC) 10 MG tablet Take 10 mg by mouth daily.   bisacodyl  (DULCOLAX) 10 MG suppository Place 10 mg rectally every Thursday.   Calcium  Carbonate Antacid (TUMS PO) Take 200 mg by mouth 2 (two) times daily as needed (reflux).   calcium -vitamin D  (OSCAL WITH D) 500-5 MG-MCG tablet Take 1 tablet by mouth 2 (two) times daily.   camphor-menthol (SARNA) lotion Apply 1 Application topically 2 (two) times daily as needed for itching.   cephALEXin  (KEFLEX ) 500 MG capsule Take 1 capsule (500 mg total) by mouth 4 (four) times daily.   Continuous Glucose Receiver (FREESTYLE LIBRE 2 READER) DEVI by Does not apply route. Friday   Continuous Glucose Sensor (FREESTYLE LIBRE 2 SENSOR) MISC    dapagliflozin  propanediol (FARXIGA ) 5 MG TABS tablet Take 5 mg by mouth daily.   DULoxetine  (CYMBALTA ) 20 MG capsule Take 20 mg by mouth daily.   famotidine (PEPCID) 20 MG tablet Take 20 mg by mouth in the morning.   ferrous sulfate  325 (65 FE) MG  EC tablet Take 325 mg by mouth 3 (three) times a week. Monday, Wednesday, and Friday.   folic acid  (FOLVITE ) 1 MG tablet Take 1 mg by mouth daily.   gabapentin  (NEURONTIN ) 300 MG capsule Take 300 mg by mouth 2 (two) times daily.   insulin  aspart (NOVOLOG  FLEXPEN) 100 UNIT/ML FlexPen Inject 5 Units into the skin 3 (three) times daily with meals. 8 am, 12 pm, and 6 pm  Hold if pt eats <50% of meals   insulin  glargine (LANTUS  SOLOSTAR) 100 UNIT/ML Solostar Pen Inject 20 Units into the skin at bedtime.   Insulin  Pen Needle 30G X 5 MM MISC 1 Device by Does not apply route daily. 3/16   ipratropium-albuterol  (DUONEB) 0.5-2.5 (3) MG/3ML SOLN Take 3 mLs by nebulization every 6  (six) hours as needed (wheezing).   lamoTRIgine  (LAMICTAL ) 150 MG tablet Take 150 mg by mouth 2 (two) times daily.   levETIRAcetam  (KEPPRA ) 500 MG tablet Take 500 mg by mouth 2 (two) times daily. Renal dose   linaclotide  (LINZESS ) 72 MCG capsule Take 72 mcg by mouth daily.   loratadine (CLARITIN) 10 MG tablet Take 10 mg by mouth daily as needed for allergies.   Magnesium  Hydroxide (MILK OF MAGNESIA PO) Take 30 mLs by mouth as needed (indigestion).   melatonin 5 MG TABS Take 5 mg by mouth at bedtime.   midazolam  (VERSED ) 10 MG/2ML SOLN injection Inject 0.4 mLs (2 mg total) into the muscle every 15 (fifteen) minutes as needed for agitation.   ondansetron  (ZOFRAN ) 4 MG/5ML solution Take 4 mg by mouth every 8 (eight) hours as needed for nausea or vomiting.   Phenylephrine -Cocoa Butter 0.25-85.5 % SUPP Place 1 suppository rectally 2 (two) times daily as needed (hemorrhoids).   rOPINIRole  (REQUIP ) 0.5 MG tablet Take 0.5 mg by mouth at bedtime.   rosuvastatin  (CRESTOR ) 20 MG tablet Take 20 mg by mouth at bedtime.   sennosides-docusate sodium  (SENOKOT-S) 8.6-50 MG tablet Take 2 tablets by mouth at bedtime.   sodium bicarbonate  650 MG tablet Take 1 tablet (650 mg total) by mouth 2 (two) times daily.   zinc oxide 20 % ointment Apply 1 Application topically 3 (three) times daily as needed for irritation. Every shift as needed   No facility-administered encounter medications on file as of 05/31/2024.    Allergies (verified) Ace inhibitors, Codeine, and Sulfa antibiotics   History: Past Medical History:  Diagnosis Date   Acute cystitis without hematuria    Adult failure to thrive    Anemia    Anxiety    Atherosclerosis of aorta    Central cord syndrome at C4 level of cervical spinal cord, subsequent encounter (HCC)    Chronic kidney disease, stage IV (severe) (HCC)    CKD (chronic kidney disease)    stage 3   Depression    DM type 2 with diabetic peripheral neuropathy (HCC)    Dysphagia     GERD (gastroesophageal reflux disease)    Gout    High cholesterol    HTN (hypertension)    Hyponatremia    MDD (major depressive disorder)    Neck pain    Neuropathy    Quadriplegia, C1-C4 incomplete (HCC)    Radiculopathy    RLS (restless legs syndrome)    Unspecified convulsions (HCC)    Urinary retention    Past Surgical History:  Procedure Laterality Date   ABDOMINAL HYSTERECTOMY     ANTERIOR CERVICAL DECOMP/DISCECTOMY FUSION N/A 02/05/2021   Procedure: Cervical Three-Four  Anterior cervical  decompression/discectomy/fusion;  Surgeon: Gillie Duncans, MD;  [facility];  Service: Neurosurgery;  Laterality: N/A;  RM 20   APPENDECTOMY     BACK SURGERY     BALLOON DILATION N/A 11/19/2022   Procedure: BALLOON DILATION;  Surgeon: Cindie Carlin POUR, DO;  Location: [facility];  Service: Endoscopy;  Laterality: N/A;   BIOPSY  09/22/2021   Procedure: BIOPSY;  Surgeon: Cindie Carlin POUR, DO;  Location: [facility];  Service: Endoscopy;;   BIOPSY  11/19/2022   Procedure: BIOPSY;  Surgeon: Cindie Carlin POUR, DO;  Location: [facility];  Service: Endoscopy;;   CERVICAL DISC SURGERY     ESOPHAGOGASTRODUODENOSCOPY (EGD) WITH PROPOFOL  N/A 09/22/2021   Procedure: ESOPHAGOGASTRODUODENOSCOPY (EGD) WITH PROPOFOL ;  Surgeon: Cindie Carlin POUR, DO;  Location: [facility];  Service: Endoscopy;  Laterality: N/A;  1:00pm   ESOPHAGOGASTRODUODENOSCOPY (EGD) WITH PROPOFOL  N/A 11/19/2022   Procedure: ESOPHAGOGASTRODUODENOSCOPY (EGD) WITH PROPOFOL ;  Surgeon: Cindie Carlin POUR, DO;  [facility]: [facility];  Service: Endoscopy;  Laterality: N/A;  11:30 am, asa 3   FLEXIBLE SIGMOIDOSCOPY  09/20/2023   Procedure: FLEXIBLE SIGMOIDOSCOPY;  Surgeon: Cindie Carlin POUR, DO;  [facility];  Service: Endoscopy;;   HAND SURGERY     KNEE SURGERY     Family History  Problem Relation Age of Onset   Cancer Mother    Cardiomyopathy Father    Seizures Sister        childhood   Seizures Brother        in  his 29s   Colon cancer Neg Hx    Social History   Socioeconomic History   Marital status: Widowed    Spouse name: Not on file   Number of children: Not on file   Years of education: Not on file   Highest education level: Not on file  Occupational History   Not on file  Tobacco Use   Smoking status: Never   Smokeless tobacco: Never  Vaping Use   Vaping status: Never Used  Substance and Sexual Activity   Alcohol use: Never   Drug use: Never   Sexual activity: Not Currently  Other Topics Concern   Not on file  Social History Narrative   Lives at [facility]   Social Drivers of Health   Financial Resource Strain: Not on file  Food Insecurity: No Food Insecurity (02/20/2023)   Hunger Vital Sign    Worried About Running Out of Food in the Last Year: Never true    Ran Out of Food in the Last Year: Never true  Transportation Needs: No Transportation Needs (02/20/2023)   PRAPARE - Administrator, Civil Service (Medical): No    Lack of Transportation (Non-Medical): No  Physical Activity: Not on file  Stress: Not on file  Social Connections: Not on file    Tobacco Counseling Counseling given: Not Answered   Clinical Intake:  Pre-visit preparation completed: Yes  Pain : No/denies pain     BMI - recorded: 28.97 Nutritional Status: BMI 25 -29 Overweight Nutritional Risks: Unintentional weight loss, Failure to thrive Diabetes: Yes CBG done?: Yes CBG resulted in Enter/ Edit results?: Yes Did pt. bring in CBG monitor from home?: No  How often do you need to have someone help you when you read instructions, pamphlets, or other written materials from your doctor or pharmacy?: 5 - Always  Interpreter Needed?: No  Comments: long term resident of snf   Activities of Daily Living  05/31/2024   11:26 AM  In your present state of health, do you have any difficulty performing the following activities:  Hearing? 0  Vision? 0  Difficulty  concentrating or making decisions? 1  Walking or climbing stairs? 1  Dressing or bathing? 1  Doing errands, shopping? 1  Preparing Food and eating ? Y  Using the Toilet? Y  In the past six months, have you accidently leaked urine? Y  Do you have problems with loss of bowel control? Y  Managing your Medications? Y  Managing your Finances? Y  Housekeeping or managing your Housekeeping? Y    Patient Care Team: [name], NP as PCP - General (Geriatric Medicine) [facility]Skilled Nursing Facility) Cindie Carlin POUR, DO as Consulting Physician (Gastroenterology)  Indicate any recent Medical Services you may have received from other than [facility] providers in the past year (date may be approximate).     Assessment:   This is a routine wellness examination for [place].  Hearing/Vision screen No results found.   Goals Addressed             This Visit's Progress    Absence of Fall and Fall-Related Injury   On track    Evidence-based guidance:  Assess fall risk using a validated tool when available. Consider balance and gait impairment, muscle weakness, diminished vision or hearing, environmental hazards, presence of urinary or bowel urgency and/or incontinence.  Communicate fall injury risk to interprofessional healthcare team.  Develop a fall prevention plan with the patient and family.  Promote use of personal vision and auditory aids.  Promote reorientation, appropriate sensory stimulation, and routines to decrease risk of fall when changes in mental status are present.  Assess assistance level required for safe and effective self-care; consider referral for home care.  Encourage physical activity, such as performance of self-care at highest level of ability, strength and balance exercise program, and provision of appropriate assistive devices; refer to rehabilitation therapy.  Refer to community-based fall prevention program where available.  If fall occurs,  determine the cause and revise fall injury prevention plan.  Regularly review medication contribution to fall risk; consider risk related to polypharmacy and age.  Refer to pharmacist for consultation when concerns about medications are revealed.  Balance adequate pain management with potential for oversedation.  Provide guidance related to environmental modifications.  Consider supplementation with Vitamin D .   Notes:      DIET - INCREASE WATER  INTAKE   Not on track    General - Client will not be readmitted within 30 days ([facility])   On track      Depression Screen    05/31/2024   11:28 AM 04/02/2023    1:52 PM 12/31/2022   11:16 AM 01/14/2022   12:32 PM 09/19/2021    1:33 PM 09/17/2021   11:19 AM 03/18/2021   12:54 PM  PHQ 2/9 Scores  PHQ - 2 Score 0 2 0 0 1 0 2  PHQ- 9 Score  2 0 1 3  4     Fall Risk    05/31/2024   11:27 AM 04/02/2023    1:50 PM 09/21/2022   11:23 AM 06/19/2022   12:05 PM 03/20/2022    2:02 PM  Fall Risk   Falls in the past year? 0 0 0 0 0  Number falls in past yr: 0 0 0 0 0  Injury with Fall? 0 0 0 0 0  Risk for fall due to : Impaired balance/gait;Impaired mobility  Impaired balance/gait;History of fall(s) History of fall(s) No Fall Risks Impaired balance/gait;Impaired mobility  Follow up  Falls evaluation completed Falls evaluation completed  Falls evaluation completed       Data saved with a previous flowsheet row definition    MEDICARE RISK AT HOME: Medicare Risk at Home Any stairs in or around the home?: Yes If so, are there any without handrails?: No Home free of loose throw rugs in walkways, pet beds, electrical cords, etc?: Yes Adequate lighting in your home to reduce risk of falls?: Yes Life alert?: No Use of a cane, walker or w/c?: Yes Grab bars in the bathroom?: Yes Shower chair or bench in shower?: Yes Elevated toilet seat or a handicapped toilet?: Yes  TIMED UP AND GO:  Was the test performed?  No    Cognitive Function:    05/31/2024    11:28 AM 03/20/2022    2:03 PM 03/18/2021   12:59 PM  MMSE - Mini Mental State Exam  Not completed: Unable to complete    Orientation to time  5 5  Orientation to Place  5 5  Registration  3 3  Attention/ Calculation  5 4  Recall  3 3  Language- name 2 objects  2 2  Language- repeat  1 1  Language- follow 3 step command  1 3  Language- read & follow direction  1 1  Write a sentence  0 0  Copy design  0 0  Total score  26 27        04/02/2023    1:52 PM 03/20/2022    2:06 PM 03/18/2021    1:00 PM  6CIT Screen  What Year? 0 points 0 points 0 points  What month? 0 points 0 points 0 points  What time? 0 points 0 points 0 points  Count back from 20 2 points 2 points 2 points  Months in reverse 2 points 2 points 2 points  Repeat phrase 0 points 0 points 2 points  Total Score 4 points 4 points 6 points    Immunizations Immunization History  Administered Date(s) Administered    sv, Bivalent, Protein Subunit Rsvpref,pf (Abrysvo) 08/03/2022   Fluad Quad(high Dose 65+) 06/04/2021, 06/28/2023   Fluad Trivalent(High Dose 65+) 06/29/2019   Influenza,inj,quad, With Preservative 06/14/2018   Influenza-Unspecified 06/05/2020, 06/02/2022   Moderna Covid-19 Fall Seasonal Vaccine 23yrs & older 06/24/2022   Moderna Covid-19 Vaccine Bivalent Booster 50yrs & up 06/24/2021   Moderna SARS-COV2 Booster Vaccination 04/16/2021   Moderna Sars-Covid-2 Vaccination 10/26/2019, 11/24/2019, 07/24/2020, 06/08/2023, 11/30/2023   Pneumococcal Conjugate-13 06/28/2017   Pneumococcal Polysaccharide-23 09/30/2020   Tdap 01/25/2021   Zoster Recombinant(Shingrix) 03/12/2021, 06/04/2021    TDAP status: Up to date  Flu Vaccine status: Up to date  Pneumococcal vaccine status: Up to date  Covid-19 vaccine status: Completed vaccines  Qualifies for Shingles Vaccine? No   Zostavax completed No     Screening Tests Health Maintenance  Topic Date Due   Medicare Annual Wellness (AWV)  04/01/2024   Influenza  Vaccine  05/31/2024 (Originally 03/31/2024)   COVID-19 Vaccine (8 - 2025-26 season) 05/31/2024 (Originally 05/01/2024)   FOOT EXAM  08/31/2024 (Originally 02/24/2023)   OPHTHALMOLOGY EXAM  08/31/2024 (Originally 08/04/1946)   HEMOGLOBIN A1C  08/25/2024   Diabetic kidney evaluation - Urine ACR  11/21/2024   Diabetic kidney evaluation - eGFR measurement  05/16/2025   DTaP/Tdap/Td (2 - Td or Tdap) 01/26/2031   Pneumococcal Vaccine: 50+ Years  Completed   DEXA SCAN  Completed   Zoster Vaccines- Shingrix  Completed   HPV VACCINES  Aged Out   Meningococcal B Vaccine  Aged Out    Health Maintenance  Health Maintenance Due  Topic Date Due   Medicare Annual Wellness (AWV)  04/01/2024    Colorectal cancer screening: No longer required.   Mammogram status: No longer required due to age.  Bone Density status: Completed 2023. Results reflect: Bone density results: OSTEOPOROSIS. Repeat every   years.  Lung Cancer Screening: (Low Dose CT Chest recommended if Age 35-80 years, 20 pack-year currently smoking OR have quit w/in 15years.) does not qualify.   Lung Cancer Screening Referral:   Additional Screening:  Hepatitis C Screening: does not qualify; Completed   Vision Screening: Recommended annual ophthalmology exams for early detection of glaucoma and other disorders of the eye. Is the patient up to date with their annual eye exam?  No  Who is the provider or what is the name of the office in which the patient attends annual eye exams?  If pt is not established with a provider, would they like to be referred to a provider to establish care? No .   Dental Screening: Recommended annual dental exams for proper oral hygiene  Diabetic Foot Exam: Diabetic Foot Exam: Completed    Community Resource Referral / Chronic Care Management: CRR required this visit?  No   CCM required this visit?  No     Plan:     I have personally reviewed and noted the following in the patient's chart:    Medical and social history Use of alcohol, tobacco or illicit drugs  Current medications and supplements including opioid prescriptions. Patient is not currently taking opioid prescriptions. Functional ability and status Nutritional status Physical activity Advanced directives List of other physicians Hospitalizations, surgeries, and ER visits in previous 12 months Vitals Screenings to include cognitive, depression, and falls Referrals and appointments  In addition, I have reviewed and discussed with patient certain preventive protocols, quality metrics, and best practice recommendations. A written personalized care plan for preventive services as well as general preventive health recommendations were provided to patient.     Barnie GORMAN Seip, NP   05/31/2024   After Visit Summary: (In Person-Declined) Patient declined AVS at this time.  Nurse Notes: this exam was performed by myself at this facility

## 2024-06-02 ENCOUNTER — Encounter: Payer: Self-pay | Admitting: Adult Health

## 2024-06-02 ENCOUNTER — Non-Acute Institutional Stay (SKILLED_NURSING_FACILITY): Payer: Self-pay | Admitting: Adult Health

## 2024-06-02 DIAGNOSIS — E1142 Type 2 diabetes mellitus with diabetic polyneuropathy: Secondary | ICD-10-CM | POA: Diagnosis not present

## 2024-06-02 DIAGNOSIS — I700 Atherosclerosis of aorta: Secondary | ICD-10-CM

## 2024-06-02 DIAGNOSIS — E039 Hypothyroidism, unspecified: Secondary | ICD-10-CM | POA: Diagnosis not present

## 2024-06-02 NOTE — Progress Notes (Signed)
 Location:  [facility] Room Number: 154-W Place of Service:  SNF (31) Provider: Barnie Seip, NP  CODE STATUS: DNR  Allergies  Allergen Reactions   Ace Inhibitors Other (See Comments)    Hyperkalemia    Codeine Other (See Comments)    Unknown    Sulfa Antibiotics Other (See Comments)    Unknown     Chief Complaint  Patient presents with   Care plan meeting    HPI:  We have come together for her care plan meeting. BIMS 13/15 mood 6/30: decreased appetite, nervous at times. Nonambulatory spends most of time in room without falls. She requires dependent assist with her adl care. She is incontinent of bowel has foley. Dietary: setup for meals; regular diet appetite 0-25% weight is 157.4 pounds has lost 15.4% in 6 months. Therapy: none at this time. Activities: in room. She will continue to be followed for her chronic illnesses including:    Aortic atherosclerosis    Hypothyroidism (acquired)  Diabetic peripheral neuropathy  Past Medical History:  Diagnosis Date   Acute cystitis without hematuria    Adult failure to thrive    Anemia    Anxiety    Atherosclerosis of aorta    Central cord syndrome at C4 level of cervical spinal cord, subsequent encounter (HCC)    Chronic kidney disease, stage IV (severe) (HCC)    CKD (chronic kidney disease)    stage 3   Depression    DM type 2 with diabetic peripheral neuropathy (HCC)    Dysphagia    GERD (gastroesophageal reflux disease)    Gout    High cholesterol    HTN (hypertension)    Hyponatremia    MDD (major depressive disorder)    Neck pain    Neuropathy    Quadriplegia, C1-C4 incomplete (HCC)    Radiculopathy    RLS (restless legs syndrome)    Unspecified convulsions (HCC)    Urinary retention     Past Surgical History:  Procedure Laterality Date   ABDOMINAL HYSTERECTOMY     ANTERIOR CERVICAL DECOMP/DISCECTOMY FUSION N/A 02/05/2021   Procedure: Cervical Three-Four  Anterior cervical  decompression/discectomy/fusion;  Surgeon: Gillie Duncans, MD;  Location: [facility] OR;  Service: Neurosurgery;  Laterality: N/A;  RM 20   APPENDECTOMY     BACK SURGERY     BALLOON DILATION N/A 11/19/2022   Procedure: BALLOON DILATION;  Surgeon: Cindie Carlin POUR, DO;  Location: [facility];  Service: Endoscopy;  Laterality: N/A;   BIOPSY  09/22/2021   Procedure: BIOPSY;  Surgeon: Cindie Carlin POUR, DO;  Location: [facility];  Service: Endoscopy;;   BIOPSY  11/19/2022   Procedure: BIOPSY;  Surgeon: Cindie Carlin POUR, DO;  Location: [facility];  Service: Endoscopy;;   CERVICAL DISC SURGERY     ESOPHAGOGASTRODUODENOSCOPY (EGD) WITH PROPOFOL  N/A 09/22/2021   Procedure: ESOPHAGOGASTRODUODENOSCOPY (EGD) WITH PROPOFOL ;  Surgeon: Cindie Carlin POUR, DO;  Location: [facility];  Service: Endoscopy;  Laterality: N/A;  1:00pm   ESOPHAGOGASTRODUODENOSCOPY (EGD) WITH PROPOFOL  N/A 11/19/2022   Procedure: ESOPHAGOGASTRODUODENOSCOPY (EGD) WITH PROPOFOL ;  Surgeon: Cindie Carlin POUR, DO;  Location: [facility];  Service: Endoscopy;  Laterality: N/A;  11:30 am, asa 3   FLEXIBLE SIGMOIDOSCOPY  09/20/2023   Procedure: FLEXIBLE SIGMOIDOSCOPY;  Surgeon: Cindie Carlin POUR, DO;  Location: [facility];  Service: Endoscopy;;   HAND SURGERY     KNEE SURGERY      Social History   Socioeconomic History  Marital status: Widowed    Spouse name: Not on file   Number of children: Not on file   Years of education: Not on file   Highest education level: Not on file  Occupational History   Not on file  Tobacco Use   Smoking status: Never   Smokeless tobacco: Never  Vaping Use   Vaping status: Never Used  Substance and Sexual Activity   Alcohol use: Never   Drug use: Never   Sexual activity: Not Currently  Other Topics Concern   Not on file  Social History Narrative   Lives at [facility]   Social Drivers of Health   Financial Resource Strain: Not on file  Food Insecurity: No Food Insecurity  (02/20/2023)   Hunger Vital Sign    Worried About Running Out of Food in the Last Year: Never true    Ran Out of Food in the Last Year: Never true  Transportation Needs: No Transportation Needs (02/20/2023)   PRAPARE - Administrator, Civil Service (Medical): No    Lack of Transportation (Non-Medical): No  Physical Activity: Not on file  Stress: Not on file  Social Connections: Not on file  Intimate Partner Violence: Not At Risk (02/20/2023)   Humiliation, Afraid, Rape, and Kick questionnaire    Fear of Current or Ex-Partner: No    Emotionally Abused: No    Physically Abused: No    Sexually Abused: No   Family History  Problem Relation Age of Onset   Cancer Mother    Cardiomyopathy Father    Seizures Sister        childhood   Seizures Brother        in his 30s   Colon cancer Neg Hx       VITAL SIGNS BP (!) 145/73   Pulse 70   Temp (!) 97.4 F (36.3 C)   Resp 20   Ht 5' 2 (1.575 m)   Wt 158 lb (71.7 kg)   SpO2 97%   BMI 28.90 kg/m   Outpatient Encounter Medications as of 06/02/2024  Medication Sig Note   acetaminophen  (TYLENOL ) 325 MG tablet Take 650 mg by mouth 3 (three) times daily.    AMBULATORY NON FORMULARY MEDICATION Medication Name:  Continue monthly catheter changes with 42fr catheter at skilled nursing facility.    amLODipine (NORVASC) 10 MG tablet Take 10 mg by mouth daily.    bisacodyl  (DULCOLAX) 10 MG suppository Place 10 mg rectally every Thursday.    Calcium  Carbonate Antacid (TUMS PO) Take 200 mg by mouth 2 (two) times daily as needed (reflux).    calcium -vitamin D  (OSCAL WITH D) 500-5 MG-MCG tablet Take 1 tablet by mouth 2 (two) times daily.    camphor-menthol (SARNA) lotion Apply 1 Application topically 2 (two) times daily as needed for itching.    cefpodoxime (VANTIN) 100 MG tablet Take 100 mg by mouth daily.    Continuous Glucose Receiver (FREESTYLE LIBRE 2 READER) DEVI by Does not apply route. Friday    Continuous Glucose Sensor  (FREESTYLE LIBRE 2 SENSOR) MISC     dapagliflozin  propanediol (FARXIGA ) 5 MG TABS tablet Take 5 mg by mouth daily.    DULoxetine  (CYMBALTA ) 20 MG capsule Take 20 mg by mouth daily.    famotidine (PEPCID) 20 MG tablet Take 20 mg by mouth in the morning.    ferrous sulfate  325 (65 FE) MG EC tablet Take 325 mg by mouth 3 (three) times a week. Monday, Wednesday, and  Friday.    folic acid  (FOLVITE ) 1 MG tablet Take 1 mg by mouth daily.    gabapentin  (NEURONTIN ) 300 MG capsule Take 300 mg by mouth 2 (two) times daily.    insulin  aspart (NOVOLOG  FLEXPEN) 100 UNIT/ML FlexPen Inject 5 Units into the skin 3 (three) times daily with meals. 8 am, 12 pm, and 6 pm  Hold if pt eats <50% of meals    insulin  glargine (LANTUS  SOLOSTAR) 100 UNIT/ML Solostar Pen Inject 20 Units into the skin at bedtime.    Insulin  Pen Needle 30G X 5 MM MISC 1 Device by Does not apply route daily. 3/16    ipratropium-albuterol  (DUONEB) 0.5-2.5 (3) MG/3ML SOLN Take 3 mLs by nebulization every 6 (six) hours as needed (wheezing).    lamoTRIgine  (LAMICTAL ) 150 MG tablet Take 150 mg by mouth 2 (two) times daily.    levETIRAcetam  (KEPPRA ) 500 MG tablet Take 500 mg by mouth 2 (two) times daily. Renal dose    linaclotide  (LINZESS ) 72 MCG capsule Take 72 mcg by mouth daily.    loratadine (CLARITIN) 10 MG tablet Take 10 mg by mouth daily as needed for allergies.    Magnesium  Hydroxide (MILK OF MAGNESIA PO) Take 30 mLs by mouth as needed (indigestion).    melatonin 5 MG TABS Take 5 mg by mouth at bedtime.    midazolam  (VERSED ) 10 MG/2ML SOLN injection Inject 0.4 mLs (2 mg total) into the muscle every 15 (fifteen) minutes as needed for agitation.    midazolam  (VERSED ) 2 MG/2ML SOLN injection Inject 10 mg into the vein as directed. Every 15 minutes as needed.    ondansetron  (ZOFRAN ) 4 MG/5ML solution Take 4 mg by mouth every 8 (eight) hours as needed for nausea or vomiting.    Phenylephrine -Cocoa Butter 0.25-85.5 % SUPP Place 1 suppository  rectally 2 (two) times daily as needed (hemorrhoids). 05/16/2024: LD >30 days ago, 06/28/2023   rOPINIRole  (REQUIP ) 0.5 MG tablet Take 0.5 mg by mouth at bedtime.    rosuvastatin  (CRESTOR ) 20 MG tablet Take 20 mg by mouth at bedtime.    sennosides-docusate sodium  (SENOKOT-S) 8.6-50 MG tablet Take 2 tablets by mouth at bedtime.    sodium bicarbonate  650 MG tablet Take 1 tablet (650 mg total) by mouth 2 (two) times daily.    solifenacin (VESICARE) 5 MG tablet Take 5 mg by mouth at bedtime.    zinc oxide 20 % ointment Apply 1 Application topically as directed. Every shift as needed    cephALEXin  (KEFLEX ) 500 MG capsule Take 1 capsule (500 mg total) by mouth 4 (four) times daily. (Patient not taking: Reported on 06/02/2024)    No facility-administered encounter medications on file as of 06/02/2024.     SIGNIFICANT DIAGNOSTIC EXAMS   LABS REVIEWED PREVIOUS        06-24-23: chol 140; ldl 64; trig 190; hdl 38 06-28-23: urine culture: proteus mirabilis: cipro  07-26-23: glucose 39; bun 30; creat 1.54; k+ 4.7; na++ 140; ca 9.0; gfr 33 hgb A1c 7.1  09-21-23: glucose 110 bun 23; creat 1.41; k+ 4.8; na++ 130; ca 9.0; gfr 37; mag 2.0  10-12-23: ACR 1050. 11-09-23: glucose 93; bun 27; creat 1.83; k+ 4.2; na++ 137; ca 9.2; gfr 27 11-22-23: wbc 4.6; hgb 8.5; hct 26.2; mcv 95.3 plt 216; glucose 169; bun 39; creat 1.58; k+ 4.5; na++ 135; ca 9.3 gfr 32; protein 6.8 albumin  3.3; hgb A1c 6.0  urine microalbumin 966.9   TODAY  02-14-24: wbc 4.0; hgb 7.9; hct 24.7; mcv  994.3 plt 186; glucose 103; bun 24; creat 1.75; k+ 4.3; na++ 131; ca 8.5 gfr 28; protein 6.3 albumin  3.0; hgb A1c 6.3; rpr: nr; vitamin B 12: 408; folate 2.9; iron 49; tsh 3.293; chol 98; ldl 40; trig 136; hdl 31   Review of Systems  Constitutional:  Negative for malaise/fatigue.  Respiratory:  Negative for cough and shortness of breath.   Cardiovascular:  Negative for chest pain, palpitations and leg swelling.  Gastrointestinal:  Negative for  abdominal pain, constipation and heartburn.  Musculoskeletal:  Negative for back pain, joint pain and myalgias.  Skin: Negative.   Neurological:  Negative for dizziness.  Psychiatric/Behavioral:  The patient is not nervous/anxious.    Physical Exam Constitutional:      General: She is not in acute distress.    Appearance: She is well-developed. She is not diaphoretic.  Neck:     Thyroid : No thyromegaly.  Cardiovascular:     Rate and Rhythm: Normal rate and regular rhythm.     Heart sounds: Normal heart sounds.  Pulmonary:     Effort: Pulmonary effort is normal. No respiratory distress.     Breath sounds: Normal breath sounds.  Abdominal:     General: Bowel sounds are normal. There is no distension.     Palpations: Abdomen is soft.     Tenderness: There is no abdominal tenderness.  Genitourinary:    Comments: Foley Musculoskeletal:     Cervical back: Neck supple.     Right lower leg: No edema.     Left lower leg: No edema.     Comments: Limited range of motion in upper extremities Does not move lower extremities   Lymphadenopathy:     Cervical: No cervical adenopathy.  Skin:    General: Skin is warm and dry.  Neurological:     Mental Status: She is alert. Mental status is at baseline.  Psychiatric:        Mood and Affect: Mood normal.      ASSESSMENT/ PLAN:  TODAY  Aortic atherosclerosis Hypothyroidism (acquired) Diabetic peripheral neuropathy  Will continue current medications Will continue current plan of care Will continue to monitor her status  Time with patient: 35 minutes: medications; dietary; activities.    Barnie Seip NP [facility]  call [phone]

## 2024-06-05 DIAGNOSIS — E1151 Type 2 diabetes mellitus with diabetic peripheral angiopathy without gangrene: Secondary | ICD-10-CM | POA: Diagnosis not present

## 2024-06-05 DIAGNOSIS — L602 Onychogryphosis: Secondary | ICD-10-CM | POA: Diagnosis not present

## 2024-06-05 DIAGNOSIS — I739 Peripheral vascular disease, unspecified: Secondary | ICD-10-CM | POA: Diagnosis not present

## 2024-06-05 DIAGNOSIS — L603 Nail dystrophy: Secondary | ICD-10-CM | POA: Diagnosis not present

## 2024-06-15 ENCOUNTER — Non-Acute Institutional Stay (SKILLED_NURSING_FACILITY): Payer: Self-pay | Admitting: Internal Medicine

## 2024-06-15 ENCOUNTER — Encounter: Payer: Self-pay | Admitting: Internal Medicine

## 2024-06-15 DIAGNOSIS — E1140 Type 2 diabetes mellitus with diabetic neuropathy, unspecified: Secondary | ICD-10-CM | POA: Diagnosis not present

## 2024-06-15 DIAGNOSIS — Z862 Personal history of diseases of the blood and blood-forming organs and certain disorders involving the immune mechanism: Secondary | ICD-10-CM

## 2024-06-15 DIAGNOSIS — Z794 Long term (current) use of insulin: Secondary | ICD-10-CM | POA: Diagnosis not present

## 2024-06-15 DIAGNOSIS — G2581 Restless legs syndrome: Secondary | ICD-10-CM

## 2024-06-15 DIAGNOSIS — R627 Adult failure to thrive: Secondary | ICD-10-CM | POA: Diagnosis not present

## 2024-06-15 DIAGNOSIS — R569 Unspecified convulsions: Secondary | ICD-10-CM | POA: Diagnosis not present

## 2024-06-15 DIAGNOSIS — N189 Chronic kidney disease, unspecified: Secondary | ICD-10-CM

## 2024-06-15 NOTE — Progress Notes (Signed)
 NURSING HOME LOCATION:  [facility] ROOM NUMBER:  154 W  CODE STATUS:  DNR  PCP: [name], NP   This is a nursing facility follow up visit for specific acute issue of recurrent seizure..  Interim medical record and care since last SNF visit was updated with review of diagnostic studies and change in clinical status since last visit were documented.  HPI: One of the resident's daughters reported that she had a recurrent seizure; the seizure was described as shaking of the arms and nonverbal status.  From what CHF, what is this was not visualized by staff.   She is on Keppra  500 mg twice a day and Lamictal  150 mg 2 times a day.  Lamictal  level was 9.5 (2-20) on 9/16.  There is no current Keppra  level. Most recent labs were performed 05/16/2024 and revealed mild hyponatremia with a sodium of 133 ; serially range has been from 130-140. Creatinine 2.14 represents peak creatinine; lowest reported creatinine was 1.41 in January of this year.  GFR is currently 22, down from a peak of 37 in January.  This represents low stage IV CKD. Normochromic, normocytic anemia has been relatively stable with current H/H of 8.7/26.5.  Most recent A1c was 6.3% on 02/24/2024;glucoses have ranged from a low of 70 up to a high of 205.  Both are outliers.  She is on low dose Farxiga   5mg  daily; insulin  glargine 20 units at bedtime; and 5 units of NovoLog  before meals. She is on ropinirole  for RLS which has been associated with hypotension, syncope, and bradycardia.  Review of systems: Dementia invalidated responses.  The resident denies having a seizure.  Daughters are concerned that she is eating poorly and inquired about an appetite stimulant.  The patient states she is just simply not hungry.  She was previously on Remeron  until May. It was stopped as no benefit documented after 30 days. Staff documents 8.2 pound weight loss since June.  Physical exam:  Pertinent or positive findings: The  patient exhibits almost no voluntary movement except for intermittent myoclonic jerking of the upper extremities. She is bed bound. Facies are blank.  Mouth is agape.  She is wearing only the upper plate.  Abdomen is protuberant.  Pedal pulses are decreased but palpable.  Limb atrophy and interosseous wasting is present. No strength to opposition in BLE.  General appearance: no acute distress, increased work of breathing is present.   Lymphatic: No lymphadenopathy about the head, neck, axilla. Eyes: No conjunctival inflammation or lid edema is present. There is no scleral icterus. Ears:  External ear exam shows no significant lesions or deformities.   Nose:  External nasal examination shows no deformity or inflammation. Nasal mucosa are pink and moist without lesions, exudates Neck:  No thyromegaly, masses, tenderness noted.    Heart:  Normal rate and regular rhythm. S1 and S2 normal without gallop, murmur, click, rub .  Lungs: Chest clear to auscultation without wheezes, rhonchi, rales, rubs. Abdomen: Bowel sounds are normal. Abdomen is soft and nontender with no organomegaly, hernias, masses. GU: Deferred  Extremities:  No cyanosis, clubbing, edema  Neurologic exam Balance, Rhomberg, finger to nose testing could not be completed due to clinical state Skin: Warm & dry w/o tenting. No significant visible lesions or rash.  See summary under each active problem in the Problem List with associated updated therapeutic plan :  RLS (restless legs syndrome) The ropinirole  can cause hypotension, bradycardia and syncope.  Risk outweigh the  benefits of this medication as adverse effects from this could be mimicking seizure activity.  It will be discontinued and she will monitored.  Adult failure to thrive syndrome Patient describes anorexia.  Daughters are concerned and wanted  to initiate an appetite stimulant.  This would be revisited after update of renal function.  She was previously on an appetite  stimulant for 1 month with no benefit.  Type 2 diabetes mellitus with diabetic neuropathy, unspecified (HCC) Most recent A1c was 6.3 on 02/24/2024.  This will be updated.  The gabapentin  dose may need to be adjusted depending on the eGFR which is pending.  Seizures (HCC) One of her daughters describe shaking of the upper extremities followed by nonverbal state.  It is possible what she exhibited was simply her myoclonic jerking or possibly ADE related toropinirole which can cause bradycardia, hypotension, and frank syncope. Keppra  and Lamictal  levels will be checked and ropinirole  discontinued.  History of anemia due to CKD Serially her normochromic, normocytic anemia has been stable.  No bleeding dyscrasias reported by staff.  CBC will be updated.

## 2024-06-15 NOTE — Patient Instructions (Signed)
 See assessment and plan under each diagnosis in the problem list and acutely for this visit

## 2024-06-15 NOTE — Assessment & Plan Note (Addendum)
 Patient describes anorexia.  Daughters are concerned and wanted  to initiate an appetite stimulant.  This would be revisited after update of renal function.  She was previously on an appetite stimulant for 1 month with no benefit.

## 2024-06-15 NOTE — Assessment & Plan Note (Signed)
 Serially her normochromic, normocytic anemia has been stable.  No bleeding dyscrasias reported by staff.  CBC will be updated.

## 2024-06-15 NOTE — Assessment & Plan Note (Signed)
 Most recent A1c was 6.3 on 02/24/2024.  This will be updated.  The gabapentin  dose may need to be adjusted depending on the eGFR which is pending.

## 2024-06-15 NOTE — Assessment & Plan Note (Addendum)
 One of her daughters describe shaking of the upper extremities followed by nonverbal state.  It is possible what she exhibited was simply her myoclonic jerking or possibly ADE related toropinirole which can cause bradycardia, hypotension, and frank syncope. Keppra  and Lamictal  levels will be checked and ropinirole  discontinued.

## 2024-06-15 NOTE — Assessment & Plan Note (Addendum)
 The ropinirole  can cause hypotension, bradycardia and syncope.  Risk outweigh the benefits of this medication as adverse effects from this could be mimicking seizure activity.  It will be discontinued and she will monitored.

## 2024-06-18 ENCOUNTER — Encounter (HOSPITAL_COMMUNITY)
Admission: RE | Admit: 2024-06-18 | Discharge: 2024-06-18 | Disposition: A | Discharge status: Home / Self Care | Source: Skilled Nursing Facility | Attending: Internal Medicine | Admitting: Internal Medicine

## 2024-06-18 DIAGNOSIS — K5900 Constipation, unspecified: Secondary | ICD-10-CM | POA: Diagnosis not present

## 2024-06-18 DIAGNOSIS — N390 Urinary tract infection, site not specified: Secondary | ICD-10-CM | POA: Diagnosis not present

## 2024-06-18 LAB — CBC WITH DIFFERENTIAL/PLATELET
Abs Immature Granulocytes: 0.01 K/uL (ref 0.00–0.07)
Basophils Absolute: 0 K/uL (ref 0.0–0.1)
Basophils Relative: 0 %
Eosinophils Absolute: 0.1 K/uL (ref 0.0–0.5)
Eosinophils Relative: 2 %
HCT: 27.5 % — ABNORMAL LOW (ref 36.0–46.0)
Hemoglobin: 8.7 g/dL — ABNORMAL LOW (ref 12.0–15.0)
Immature Granulocytes: 0 %
Lymphocytes Relative: 40 %
Lymphs Abs: 2 K/uL (ref 0.7–4.0)
MCH: 32.2 pg (ref 26.0–34.0)
MCHC: 31.6 g/dL (ref 30.0–36.0)
MCV: 101.9 fL — ABNORMAL HIGH (ref 80.0–100.0)
Monocytes Absolute: 0.3 K/uL (ref 0.1–1.0)
Monocytes Relative: 7 %
Neutro Abs: 2.5 K/uL (ref 1.7–7.7)
Neutrophils Relative %: 51 %
Platelets: 197 K/uL (ref 150–400)
RBC: 2.7 MIL/uL — ABNORMAL LOW (ref 3.87–5.11)
RDW: 14.7 % (ref 11.5–15.5)
WBC: 5 K/uL (ref 4.0–10.5)
nRBC: 0 % (ref 0.0–0.2)

## 2024-06-18 LAB — BASIC METABOLIC PANEL WITH GFR
Anion gap: 13 (ref 5–15)
BUN: 44 mg/dL — ABNORMAL HIGH (ref 8–23)
CO2: 26 mmol/L (ref 22–32)
Calcium: 11.7 mg/dL — ABNORMAL HIGH (ref 8.9–10.3)
Chloride: 101 mmol/L (ref 98–111)
Creatinine, Ser: 2.18 mg/dL — ABNORMAL HIGH (ref 0.44–1.00)
GFR, Estimated: 22 mL/min — ABNORMAL LOW (ref >60)
Glucose, Bld: 226 mg/dL — ABNORMAL HIGH (ref 70–99)
Potassium: 4.6 mmol/L (ref 3.5–5.1)
Sodium: 140 mmol/L (ref 135–145)

## 2024-06-19 ENCOUNTER — Other Ambulatory Visit (HOSPITAL_COMMUNITY)
Admission: RE | Admit: 2024-06-19 | Discharge: 2024-06-19 | Disposition: A | Discharge status: Home / Self Care | Source: Skilled Nursing Facility | Attending: Internal Medicine | Admitting: Internal Medicine

## 2024-06-19 DIAGNOSIS — R1032 Left lower quadrant pain: Secondary | ICD-10-CM | POA: Diagnosis not present

## 2024-06-19 DIAGNOSIS — R569 Unspecified convulsions: Secondary | ICD-10-CM | POA: Insufficient documentation

## 2024-06-19 DIAGNOSIS — R627 Adult failure to thrive: Secondary | ICD-10-CM | POA: Insufficient documentation

## 2024-06-19 DIAGNOSIS — R110 Nausea: Secondary | ICD-10-CM | POA: Insufficient documentation

## 2024-06-19 DIAGNOSIS — E1122 Type 2 diabetes mellitus with diabetic chronic kidney disease: Secondary | ICD-10-CM | POA: Diagnosis not present

## 2024-06-19 DIAGNOSIS — N184 Chronic kidney disease, stage 4 (severe): Secondary | ICD-10-CM | POA: Insufficient documentation

## 2024-06-19 LAB — CBC WITH DIFFERENTIAL/PLATELET
Abs Immature Granulocytes: 0.01 K/uL (ref 0.00–0.07)
Basophils Absolute: 0 K/uL (ref 0.0–0.1)
Basophils Relative: 0 %
Eosinophils Absolute: 0.1 K/uL (ref 0.0–0.5)
Eosinophils Relative: 2 %
HCT: 27 % — ABNORMAL LOW (ref 36.0–46.0)
Hemoglobin: 8.5 g/dL — ABNORMAL LOW (ref 12.0–15.0)
Immature Granulocytes: 0 %
Lymphocytes Relative: 47 %
Lymphs Abs: 2.1 K/uL (ref 0.7–4.0)
MCH: 32 pg (ref 26.0–34.0)
MCHC: 31.5 g/dL (ref 30.0–36.0)
MCV: 101.5 fL — ABNORMAL HIGH (ref 80.0–100.0)
Monocytes Absolute: 0.3 K/uL (ref 0.1–1.0)
Monocytes Relative: 6 %
Neutro Abs: 2 K/uL (ref 1.7–7.7)
Neutrophils Relative %: 45 %
Platelets: 194 K/uL (ref 150–400)
RBC: 2.66 MIL/uL — ABNORMAL LOW (ref 3.87–5.11)
RDW: 14.7 % (ref 11.5–15.5)
WBC: 4.5 K/uL (ref 4.0–10.5)
nRBC: 0 % (ref 0.0–0.2)

## 2024-06-19 LAB — BASIC METABOLIC PANEL WITH GFR
Anion gap: 13 (ref 5–15)
BUN: 43 mg/dL — ABNORMAL HIGH (ref 8–23)
CO2: 26 mmol/L (ref 22–32)
Calcium: 11.5 mg/dL — ABNORMAL HIGH (ref 8.9–10.3)
Chloride: 101 mmol/L (ref 98–111)
Creatinine, Ser: 2.12 mg/dL — ABNORMAL HIGH (ref 0.44–1.00)
GFR, Estimated: 22 mL/min — ABNORMAL LOW (ref >60)
Glucose, Bld: 149 mg/dL — ABNORMAL HIGH (ref 70–99)
Potassium: 3.9 mmol/L (ref 3.5–5.1)
Sodium: 140 mmol/L (ref 135–145)

## 2024-06-19 LAB — HEMOGLOBIN A1C
Hgb A1c MFr Bld: 6.7 % — ABNORMAL HIGH (ref 4.8–5.6)
Mean Plasma Glucose: 145.59 mg/dL

## 2024-06-20 ENCOUNTER — Other Ambulatory Visit (HOSPITAL_COMMUNITY)
Admission: RE | Admit: 2024-06-20 | Discharge: 2024-06-20 | Disposition: A | Discharge status: Home / Self Care | Source: Skilled Nursing Facility | Attending: Internal Medicine | Admitting: Internal Medicine

## 2024-06-20 ENCOUNTER — Non-Acute Institutional Stay (SKILLED_NURSING_FACILITY): Payer: Self-pay | Admitting: Internal Medicine

## 2024-06-20 ENCOUNTER — Encounter: Payer: Self-pay | Admitting: Internal Medicine

## 2024-06-20 DIAGNOSIS — N189 Chronic kidney disease, unspecified: Secondary | ICD-10-CM | POA: Diagnosis not present

## 2024-06-20 DIAGNOSIS — R569 Unspecified convulsions: Secondary | ICD-10-CM

## 2024-06-20 DIAGNOSIS — D539 Nutritional anemia, unspecified: Secondary | ICD-10-CM | POA: Diagnosis not present

## 2024-06-20 DIAGNOSIS — Z794 Long term (current) use of insulin: Secondary | ICD-10-CM | POA: Diagnosis not present

## 2024-06-20 DIAGNOSIS — N3000 Acute cystitis without hematuria: Secondary | ICD-10-CM | POA: Diagnosis not present

## 2024-06-20 DIAGNOSIS — R627 Adult failure to thrive: Secondary | ICD-10-CM | POA: Diagnosis not present

## 2024-06-20 DIAGNOSIS — E1140 Type 2 diabetes mellitus with diabetic neuropathy, unspecified: Secondary | ICD-10-CM | POA: Diagnosis not present

## 2024-06-20 DIAGNOSIS — D649 Anemia, unspecified: Secondary | ICD-10-CM | POA: Diagnosis not present

## 2024-06-20 LAB — LAMOTRIGINE LEVEL: Lamotrigine Lvl: 13.4 ug/mL (ref 2.0–20.0)

## 2024-06-20 LAB — LEVETIRACETAM LEVEL: Levetiracetam Lvl: 44.2 ug/mL — ABNORMAL HIGH (ref 10.0–40.0)

## 2024-06-20 LAB — VITAMIN B12: Vitamin B-12: 1090 pg/mL — ABNORMAL HIGH (ref 180–914)

## 2024-06-20 LAB — FOLATE: Folate: >20 ng/mL (ref >5.9)

## 2024-06-20 NOTE — Assessment & Plan Note (Signed)
 Serially the anemia is relatively stable with current H/H of 8.5/27.  Folate and B12 levels were normal to supratherapeutic.  Continue to monitor.

## 2024-06-20 NOTE — Assessment & Plan Note (Signed)
 Nutritional intake poor in context of anorexia. Nutrition continues to monitor. Family encouraged to brng in any foods she enjoys.

## 2024-06-20 NOTE — Assessment & Plan Note (Signed)
 Updated A1c 6.7%. Insulin  held based on meal intake.Intake reported as extremely poor.

## 2024-06-20 NOTE — Progress Notes (Unsigned)
 NURSING HOME LOCATION:  [facility] ROOM NUMBER:  154 W  CODE STATUS:  DNR  PCP: [name], NP   This is a nursing facility follow up visit for several acute issues including in the context of CKD stage IV.  In context of CKD Stage 4.  Interim medical record and care since last SNF visit was updated with review of diagnostic studies and change in clinical status since last visit were documented.  HPI: Foley has been removed.  She remains incontinent.  There apparently is some scar tissue in the sacral area as she is immobile and totally bedridden.  Urine culture did reveal greater than 100,000 Providencia stuartii I for which Keflex  was started on 9/16 to be continued till 9/21.  Lab reports that the urine has been recultured to define sensitivities.  Since the Foley was removed bladder scanning has revealed only 50-100 mL of urine retention. Updated labs reveal a peak calcium  11.7, up from a prior value of 10.  Her calcium  and vitamin D  supplement as well as Tums as needed were discontinued.  Intact parathyroid hormone has been ordered to rule out secondary hyperparathyroidism in the context of CKD stage IV with stable eGFR of 22.  Creatinine was actually dropped slightly to 2.12.  Normochromic, macrocytic anemia is relatively stable with current H/H of 8.5/27.  Folate and B12 levels were normal to supratherapeutic.  Folate had risen from 2.9-8/20.  A1c has increased to 6.7%; this is in the context of insulin  being held when meal intake is poor.   Review of systems:  She describes a little cough intermittently without sputum production. Numbness is her feet is chronic. She denies GU or GI symptoms.  Constitutional: No fever, significant weight change, fatigue  Eyes: No redness, discharge, pain, vision change ENT/mouth: No nasal congestion,  purulent discharge, earache, change in hearing, sore throat  Cardiovascular: No chest pain, palpitations, paroxysmal  nocturnal dyspnea, claudication, edema  Respiratory: No cough, sputum production, hemoptysis, DOE, significant snoring, apnea   Gastrointestinal: No heartburn, dysphagia, abdominal pain, nausea /vomiting, rectal bleeding, melena, change in bowels Genitourinary: No dysuria, hematuria, pyuria, incontinence, nocturia Musculoskeletal: No joint stiffness, joint swelling, weakness, pain Dermatologic: No rash, pruritus, change in appearance of skin Neurologic: No dizziness, headache, syncope, seizures, numbness, tingling Psychiatric: No significant anxiety, depression, insomnia, anorexia Endocrine: No change in hair/skin/nails, excessive thirst, excessive hunger, excessive urination  Hematologic/lymphatic: No significant bruising, lymphadenopathy, abnormal bleeding Allergy/immunology: No itchy/watery eyes, significant sneezing, urticaria, angioedema  Physical exam:  Pertinent or positive findings: Initially she was asleep with mouth agape & exhibiting hypopnea. She aroused easily. Voice is very weak & raspy. She wears only the upper plate. Breath sounds are decreased. Slight increase in S2 noted. Limb atrophy & IOW present. Pedal pulses not palpable except R DPP which is weak. Small papule L superior forehead.  General appearance: Adequately nourished; no acute distress, increased work of breathing is present.   Lymphatic: No lymphadenopathy about the head, neck, axilla. Eyes: No conjunctival inflammation or lid edema is present. There is no scleral icterus. Ears:  External ear exam shows no significant lesions or deformities.   Nose:  External nasal examination shows no deformity or inflammation. Nasal mucosa are pink and moist without lesions, exudates Oral exam:  Lips and gums are healthy appearing. There is no oropharyngeal erythema or exudate. Neck:  No thyromegaly, masses, tenderness noted.    Heart:  Normal rate and regular rhythm. S1 and S2 normal without gallop,  murmur, click, rub .  Lungs:  Chest clear to auscultation without wheezes, rhonchi, rales, rubs. Abdomen: Bowel sounds are normal. Abdomen is soft and nontender with no organomegaly, hernias, masses. GU: Deferred  Extremities:  No cyanosis, clubbing, edema  Neurologic exam : Cn 2-7 intact Strength equal  in upper & lower extremities Balance, Rhomberg, finger to nose testing could not be completed due to clinical state Deep tendon reflexes are equal Skin: Warm & dry w/o tenting. No significant lesions or rash.  See summary under each active problem in the Problem List with associated updated therapeutic plan

## 2024-06-20 NOTE — Patient Instructions (Signed)
 See assessment and plan under each diagnosis in the problem list and acutely for this visit

## 2024-06-20 NOTE — Assessment & Plan Note (Addendum)
 Current eGFR stable @ 22.   Son is on HD for ESRD. He would not want his mother to be on HD.Calcium  11.5. Calcium  , vitamin D  & TUMs D/Ced. Intact PTH pending to assess for secondary hyperparathyroidism.

## 2024-06-21 LAB — URINE CULTURE: Culture: >=100000 — AB

## 2024-06-21 LAB — PARATHYROID HORMONE, INTACT (NO CA): PTH: <6 pg/mL — ABNORMAL LOW (ref 15–65)

## 2024-06-21 NOTE — Assessment & Plan Note (Signed)
 Keppra  level slightly above high therapeutic range of 40.  Dose will be decreased to 500 mg twice daily to 250 mg twice daily.

## 2024-06-22 ENCOUNTER — Other Ambulatory Visit (HOSPITAL_COMMUNITY)
Admission: RE | Admit: 2024-06-22 | Discharge: 2024-06-22 | Disposition: A | Discharge status: Home / Self Care | Source: Skilled Nursing Facility | Attending: Internal Medicine | Admitting: Internal Medicine

## 2024-06-22 DIAGNOSIS — E875 Hyperkalemia: Secondary | ICD-10-CM | POA: Diagnosis present

## 2024-06-22 LAB — BASIC METABOLIC PANEL WITH GFR
Anion gap: 14 (ref 5–15)
BUN: 54 mg/dL — ABNORMAL HIGH (ref 8–23)
CO2: 25 mmol/L (ref 22–32)
Calcium: 10.5 mg/dL — ABNORMAL HIGH (ref 8.9–10.3)
Chloride: 101 mmol/L (ref 98–111)
Creatinine, Ser: 2.31 mg/dL — ABNORMAL HIGH (ref 0.44–1.00)
GFR, Estimated: 20 mL/min — ABNORMAL LOW (ref >60)
Glucose, Bld: 106 mg/dL — ABNORMAL HIGH (ref 70–99)
Potassium: 3.8 mmol/L (ref 3.5–5.1)
Sodium: 140 mmol/L (ref 135–145)

## 2024-07-07 ENCOUNTER — Other Ambulatory Visit: Payer: Self-pay | Admitting: Adult Health

## 2024-07-07 MED ORDER — DRONABINOL 2.5 MG PO CAPS: 2.5000 mg | ORAL_CAPSULE | Freq: Every day | ORAL | 0 refills | Status: AC

## 2024-07-11 ENCOUNTER — Other Ambulatory Visit (HOSPITAL_COMMUNITY)
Admission: RE | Admit: 2024-07-11 | Discharge: 2024-07-11 | Disposition: A | Discharge status: Home / Self Care | Source: Skilled Nursing Facility | Attending: Adult Health | Admitting: Adult Health

## 2024-07-11 DIAGNOSIS — E113293 Type 2 diabetes mellitus with mild nonproliferative diabetic retinopathy without macular edema, bilateral: Secondary | ICD-10-CM | POA: Insufficient documentation

## 2024-07-11 DIAGNOSIS — N390 Urinary tract infection, site not specified: Secondary | ICD-10-CM | POA: Diagnosis not present

## 2024-07-11 DIAGNOSIS — Z4803 Encounter for change or removal of drains: Secondary | ICD-10-CM | POA: Insufficient documentation

## 2024-07-11 LAB — CBC WITH DIFFERENTIAL/PLATELET
Abs Immature Granulocytes: 0.01 K/uL (ref 0.00–0.07)
Basophils Absolute: 0 K/uL (ref 0.0–0.1)
Basophils Relative: 0 %
Eosinophils Absolute: 0.1 K/uL (ref 0.0–0.5)
Eosinophils Relative: 2 %
HCT: 23.3 % — ABNORMAL LOW (ref 36.0–46.0)
Hemoglobin: 7.4 g/dL — ABNORMAL LOW (ref 12.0–15.0)
Immature Granulocytes: 0 %
Lymphocytes Relative: 43 %
Lymphs Abs: 2 K/uL (ref 0.7–4.0)
MCH: 32.2 pg (ref 26.0–34.0)
MCHC: 31.8 g/dL (ref 30.0–36.0)
MCV: 101.3 fL — ABNORMAL HIGH (ref 80.0–100.0)
Monocytes Absolute: 0.3 K/uL (ref 0.1–1.0)
Monocytes Relative: 7 %
Neutro Abs: 2.2 K/uL (ref 1.7–7.7)
Neutrophils Relative %: 48 %
Platelets: 230 K/uL (ref 150–400)
RBC: 2.3 MIL/uL — ABNORMAL LOW (ref 3.87–5.11)
RDW: 14.8 % (ref 11.5–15.5)
WBC: 4.6 K/uL (ref 4.0–10.5)
nRBC: 0 % (ref 0.0–0.2)

## 2024-07-11 LAB — URINALYSIS, ROUTINE W REFLEX MICROSCOPIC
Bilirubin Urine: NEGATIVE
Glucose, UA: 50 mg/dL — AB
Ketones, ur: NEGATIVE mg/dL
Nitrite: NEGATIVE
Protein, ur: 100 mg/dL — AB
Specific Gravity, Urine: 1.011 (ref 1.005–1.030)
WBC, UA: >50 WBC/hpf (ref 0–5)
pH: 7 (ref 5.0–8.0)

## 2024-07-11 LAB — BASIC METABOLIC PANEL WITH GFR
Anion gap: 14 (ref 5–15)
BUN: 48 mg/dL — ABNORMAL HIGH (ref 8–23)
CO2: 23 mmol/L (ref 22–32)
Calcium: 9.4 mg/dL (ref 8.9–10.3)
Chloride: 101 mmol/L (ref 98–111)
Creatinine, Ser: 2.06 mg/dL — ABNORMAL HIGH (ref 0.44–1.00)
GFR, Estimated: 23 mL/min — ABNORMAL LOW (ref >60)
Glucose, Bld: 221 mg/dL — ABNORMAL HIGH (ref 70–99)
Potassium: 4.8 mmol/L (ref 3.5–5.1)
Sodium: 138 mmol/L (ref 135–145)

## 2024-07-13 ENCOUNTER — Other Ambulatory Visit (HOSPITAL_COMMUNITY)
Admission: RE | Admit: 2024-07-13 | Discharge: 2024-07-13 | Disposition: A | Discharge status: Home / Self Care | Source: Skilled Nursing Facility | Attending: Adult Health | Admitting: Adult Health

## 2024-07-13 DIAGNOSIS — N3000 Acute cystitis without hematuria: Secondary | ICD-10-CM | POA: Insufficient documentation

## 2024-07-13 LAB — URINALYSIS, ROUTINE W REFLEX MICROSCOPIC
Bilirubin Urine: NEGATIVE
Glucose, UA: 50 mg/dL — AB
Ketones, ur: NEGATIVE mg/dL
Nitrite: POSITIVE — AB
Protein, ur: 100 mg/dL — AB
RBC / HPF: >50 RBC/hpf (ref 0–5)
Specific Gravity, Urine: 1.017 (ref 1.005–1.030)
pH: 7 (ref 5.0–8.0)

## 2024-07-13 LAB — URINE CULTURE

## 2024-07-14 ENCOUNTER — Non-Acute Institutional Stay (SKILLED_NURSING_FACILITY): Payer: Self-pay | Admitting: Adult Health

## 2024-07-14 ENCOUNTER — Encounter: Payer: Self-pay | Admitting: Adult Health

## 2024-07-14 DIAGNOSIS — F339 Major depressive disorder, recurrent, unspecified: Secondary | ICD-10-CM

## 2024-07-14 DIAGNOSIS — Z794 Long term (current) use of insulin: Secondary | ICD-10-CM

## 2024-07-14 DIAGNOSIS — G8252 Quadriplegia, C1-C4 incomplete: Secondary | ICD-10-CM | POA: Diagnosis not present

## 2024-07-14 NOTE — Progress Notes (Signed)
 Location:  [facility] Room Number: South/154/W Place of Service:  SNF (31)   CODE STATUS: DNR  Allergies  Allergen Reactions   Ace Inhibitors Other (See Comments)    Hyperkalemia    Codeine Other (See Comments)    Unknown    Sulfa Antibiotics Other (See Comments)    Unknown     Chief Complaint  Patient presents with   Care Plan Meeting     HPI:  We have come together for her care plan meeting. Family present  BIMS 14/15 mood 6/30: little energy, not sleeping well. Remains in bed without falls. She requires dependent assist with her adl care. She has a foley and is incontinent of bowel. Dietary: setup for meals appetite 1-25% dysphagia 3 diet weight is 155 pounds has been started on marinol for appetite stimulant; is on supplements. Therapy: none at this time. Activities: 1:1 in room.  He will continue to be followed for his chronic illnesses including:   Quadriplegia c1-c4 incomplete   Insulin  long term use   Major depression recurrent chronic  Past Medical History:  Diagnosis Date   Acute cystitis without hematuria    Adult failure to thrive    Anemia    Anxiety    Atherosclerosis of aorta    Central cord syndrome at C4 level of cervical spinal cord, subsequent encounter (HCC)    Chronic kidney disease, stage IV (severe) (HCC)    CKD (chronic kidney disease)    stage 3   Depression    DM type 2 with diabetic peripheral neuropathy (HCC)    Dysphagia    GERD (gastroesophageal reflux disease)    Gout    High cholesterol    HTN (hypertension)    Hyponatremia    MDD (major depressive disorder)    Neck pain    Neuropathy    Quadriplegia, C1-C4 incomplete (HCC)    Radiculopathy    RLS (restless legs syndrome)    Unspecified convulsions (HCC)    Urinary retention     Past Surgical History:  Procedure Laterality Date   ABDOMINAL HYSTERECTOMY     ANTERIOR CERVICAL DECOMP/DISCECTOMY FUSION N/A 02/05/2021   Procedure: Cervical Three-Four  Anterior  cervical decompression/discectomy/fusion;  Surgeon: Gillie Duncans, MD;  Location: [facility] OR;  Service: Neurosurgery;  Laterality: N/A;  RM 20   APPENDECTOMY     BACK SURGERY     BALLOON DILATION N/A 11/19/2022   Procedure: BALLOON DILATION;  Surgeon: Cindie Carlin POUR, DO;  [facility]: [facility];  Service: Endoscopy;  Laterality: N/A;   BIOPSY  09/22/2021   Procedure: BIOPSY;  Surgeon: Cindie Carlin POUR, DO;  Location: [facility];  Service: Endoscopy;;   BIOPSY  11/19/2022   Procedure: BIOPSY;  Surgeon: Cindie Carlin POUR, DO;  [facility]: [facility];  Service: Endoscopy;;   CERVICAL DISC SURGERY     ESOPHAGOGASTRODUODENOSCOPY (EGD) WITH PROPOFOL  N/A 09/22/2021   Procedure: ESOPHAGOGASTRODUODENOSCOPY (EGD) WITH PROPOFOL ;  Surgeon: Cindie Carlin POUR, DO;  [facility]: [facility];  Service: Endoscopy;  Laterality: N/A;  1:00pm   ESOPHAGOGASTRODUODENOSCOPY (EGD) WITH PROPOFOL  N/A 11/19/2022   Procedure: ESOPHAGOGASTRODUODENOSCOPY (EGD) WITH PROPOFOL ;  Surgeon: Cindie Carlin POUR, DO;  [facility]: [facility];  Service: Endoscopy;  Laterality: N/A;  11:30 am, asa 3   FLEXIBLE SIGMOIDOSCOPY  09/20/2023   Procedure: FLEXIBLE SIGMOIDOSCOPY;  Surgeon: Cindie Carlin POUR, DO;  [facility]: [facility];  Service: Endoscopy;;   HAND SURGERY     KNEE SURGERY  Social History   Socioeconomic History   Marital status: Widowed    Spouse name: Not on file   Number of children: Not on file   Years of education: Not on file   Highest education level: Not on file  Occupational History   Not on file  Tobacco Use   Smoking status: Never   Smokeless tobacco: Never  Vaping Use   Vaping status: Never Used  Substance and Sexual Activity   Alcohol use: Never   Drug use: Never   Sexual activity: Not Currently  Other Topics Concern   Not on file  Social History Narrative   Lives at [facility]   Social Drivers of Health   Financial Resource Strain: Not on file  Food Insecurity: No Food  Insecurity (02/20/2023)   Hunger Vital Sign    Worried About Running Out of Food in the Last Year: Never true    Ran Out of Food in the Last Year: Never true  Transportation Needs: No Transportation Needs (02/20/2023)   PRAPARE - Administrator, Civil Service (Medical): No    Lack of Transportation (Non-Medical): No  Physical Activity: Not on file  Stress: Not on file  Social Connections: Not on file  Intimate Partner Violence: Not At Risk (02/20/2023)   Humiliation, Afraid, Rape, and Kick questionnaire    Fear of Current or Ex-Partner: No    Emotionally Abused: No    Physically Abused: No    Sexually Abused: No   Family History  Problem Relation Age of Onset   Cancer Mother    Cardiomyopathy Father    Seizures Sister        childhood   Seizures Brother        in his 30s   Colon cancer Neg Hx       VITAL SIGNS BP (!) 132/52   Pulse 74   Temp 98 F (36.7 C)   Resp 18   Ht 5' 2 (1.575 m)   Wt 155 lb 1.6 oz (70.4 kg)   SpO2 96%   BMI 28.37 kg/m   Outpatient Encounter Medications as of 07/14/2024  Medication Sig Note   acetaminophen  (TYLENOL ) 325 MG tablet Take 650 mg by mouth 3 (three) times daily.    aluminum-magnesium  hydroxide 200-200 MG/5ML suspension Take 15 mLs by mouth every 6 (six) hours as needed (Indigestion and heartburn).    AMBULATORY NON FORMULARY MEDICATION Medication Name:  Continue monthly catheter changes with 73fr catheter at skilled nursing facility.    amLODipine (NORVASC) 10 MG tablet Take 10 mg by mouth daily.    ascorbic acid (VITAMIN C) 500 MG tablet Take 500 mg by mouth daily.    bisacodyl  (DULCOLAX) 10 MG suppository Place 10 mg rectally every Thursday.    camphor-menthol (SARNA) lotion Apply 1 Application topically 2 (two) times daily as needed for itching.    Continuous Glucose Receiver (FREESTYLE LIBRE 2 READER) DEVI by Does not apply route. Friday    Continuous Glucose Sensor (FREESTYLE LIBRE 2 SENSOR) MISC     dapagliflozin   propanediol (FARXIGA ) 5 MG TABS tablet Take 5 mg by mouth daily.    dronabinol (MARINOL) 2.5 MG capsule Take 1 capsule (2.5 mg total) by mouth daily before lunch for 27 days.    DULoxetine  (CYMBALTA ) 20 MG capsule Take 20 mg by mouth daily.    famotidine (PEPCID) 20 MG tablet Take 20 mg by mouth in the morning.    ferrous sulfate  325 (65 FE) MG  EC tablet Take 325 mg by mouth 3 (three) times a week. Monday, Wednesday, and Friday.    fluconazole (DIFLUCAN) 200 MG tablet Take 200 mg by mouth daily. Yeast in urine    folic acid  (FOLVITE ) 1 MG tablet Take 1 mg by mouth daily.    gabapentin  (NEURONTIN ) 300 MG capsule Take 300 mg by mouth 2 (two) times daily.    insulin  aspart (NOVOLOG  FLEXPEN) 100 UNIT/ML FlexPen Inject 5 Units into the skin 3 (three) times daily with meals. 8 am, 12 pm, and 6 pm  Hold if pt eats <50% of meals    insulin  glargine (LANTUS  SOLOSTAR) 100 UNIT/ML Solostar Pen Inject 20 Units into the skin at bedtime.    Insulin  Pen Needle 30G X 5 MM MISC 1 Device by Does not apply route daily. 3/16    ipratropium-albuterol  (DUONEB) 0.5-2.5 (3) MG/3ML SOLN Take 3 mLs by nebulization every 6 (six) hours as needed (wheezing).    lamoTRIgine  (LAMICTAL ) 150 MG tablet Take 150 mg by mouth 2 (two) times daily.    levETIRAcetam  (KEPPRA ) 500 MG tablet Take 500 mg by mouth 2 (two) times daily. Renal dose    linaclotide  (LINZESS ) 72 MCG capsule Take 72 mcg by mouth daily.    loratadine (CLARITIN) 10 MG tablet Take 10 mg by mouth daily as needed for allergies.    Magnesium  Hydroxide (MILK OF MAGNESIA PO) Take 30 mLs by mouth as needed (indigestion).    melatonin 5 MG TABS Take 5 mg by mouth at bedtime.    midazolam  (VERSED ) 10 MG/2ML SOLN injection Inject 0.4 mLs (2 mg total) into the muscle every 15 (fifteen) minutes as needed for agitation. (Patient taking differently: Inject 2 mg into the muscle every 15 (fifteen) minutes as needed for agitation. Special Instructions: Administer 10mg  (one 2ml vial)  IM every 15 minutes  up to (4) total doses for seizures lasting >1 minute -)    ondansetron  (ZOFRAN ) 4 MG/5ML solution Take 4 mg by mouth every 8 (eight) hours as needed for nausea or vomiting.    Phenylephrine -Cocoa Butter 0.25-85.5 % SUPP Place 1 suppository rectally 2 (two) times daily as needed (hemorrhoids). 05/16/2024: LD >30 days ago, 06/28/2023   rosuvastatin  (CRESTOR ) 20 MG tablet Take 20 mg by mouth at bedtime.    SANTYL 250 UNIT/GM ointment Apply 1 Application topically daily.  Apply to sacral wound daily per tx orders.    sennosides-docusate sodium  (SENOKOT-S) 8.6-50 MG tablet Take 2 tablets by mouth at bedtime.    sodium bicarbonate  650 MG tablet Take 1 tablet (650 mg total) by mouth 2 (two) times daily.    solifenacin (VESICARE) 5 MG tablet Take 5 mg by mouth at bedtime.    zinc oxide 20 % ointment Apply 1 Application topically as directed. Every shift as needed    [DISCONTINUED] Calcium  Carbonate Antacid (TUMS PO) Take 200 mg by mouth 2 (two) times daily as needed (reflux). (Patient not taking: Reported on 07/14/2024)    [DISCONTINUED] calcium -vitamin D  (OSCAL WITH D) 500-5 MG-MCG tablet Take 1 tablet by mouth 2 (two) times daily. (Patient not taking: Reported on 07/14/2024)    [DISCONTINUED] cefpodoxime (VANTIN) 100 MG tablet Take 100 mg by mouth daily. (Patient not taking: Reported on 07/14/2024)    [DISCONTINUED] cephALEXin  (KEFLEX ) 500 MG capsule Take 1 capsule (500 mg total) by mouth 4 (four) times daily. (Patient not taking: Reported on 07/14/2024)    [DISCONTINUED] midazolam  (VERSED ) 2 MG/2ML SOLN injection Inject 10 mg into the vein as directed.  Every 15 minutes as needed. (Patient not taking: Reported on 07/14/2024)    [DISCONTINUED] rOPINIRole  (REQUIP ) 0.5 MG tablet Take 0.5 mg by mouth at bedtime. (Patient not taking: Reported on 07/14/2024)    No facility-administered encounter medications on file as of 07/14/2024.     SIGNIFICANT DIAGNOSTIC EXAMS   LABS REVIEWED  PREVIOUS        07-26-23: glucose 39; bun 30; creat 1.54; k+ 4.7; na++ 140; ca 9.0; gfr 33 hgb A1c 7.1  09-21-23: glucose 110 bun 23; creat 1.41; k+ 4.8; na++ 130; ca 9.0; gfr 37; mag 2.0  10-12-23: ACR 1050. 11-09-23: glucose 93; bun 27; creat 1.83; k+ 4.2; na++ 137; ca 9.2; gfr 27 11-22-23: wbc 4.6; hgb 8.5; hct 26.2; mcv 95.3 plt 216; glucose 169; bun 39; creat 1.58; k+ 4.5; na++ 135; ca 9.3 gfr 32; protein 6.8 albumin  3.3; hgb A1c 6.0  urine microalbumin 966.9   TODAY  02-14-24: wbc 4.0; hgb 7.9; hct 24.7; mcv 994.3 plt 186; glucose 103; bun 24; creat 1.75; k+ 4.3; na++ 131; ca 8.5 gfr 28; protein 6.3 albumin  3.0; hgb A1c 6.3; rpr: nr; vitamin B 12: 408; folate 2.9; iron 49; tsh 3.293; chol 98; ldl 40; trig 136; hdl 31   Review of Systems  Constitutional:  Negative for malaise/fatigue.  Respiratory:  Negative for cough and shortness of breath.   Cardiovascular:  Negative for chest pain, palpitations and leg swelling.  Gastrointestinal:  Negative for abdominal pain, constipation and heartburn.  Musculoskeletal:  Negative for back pain, joint pain and myalgias.  Skin: Negative.   Neurological:  Negative for dizziness.  Psychiatric/Behavioral:  The patient is not nervous/anxious.     Physical Exam Constitutional:      General: She is not in acute distress.    Appearance: She is well-developed. She is not diaphoretic.  Neck:     Thyroid : No thyromegaly.  Cardiovascular:     Rate and Rhythm: Normal rate and regular rhythm.     Heart sounds: Normal heart sounds.  Pulmonary:     Effort: Pulmonary effort is normal. No respiratory distress.     Breath sounds: Normal breath sounds.  Abdominal:     General: Bowel sounds are normal. There is no distension.     Palpations: Abdomen is soft.     Tenderness: There is no abdominal tenderness.  Genitourinary:    Comments: foley Musculoskeletal:     Cervical back: Neck supple.     Right lower leg: No edema.     Left lower leg: No edema.      Comments: Limited range of motion in upper extremities Does not move lower extremities    Lymphadenopathy:     Cervical: No cervical adenopathy.  Skin:    General: Skin is warm and dry.     Comments: Sacrum: 4.5 x 6.0 x 0.2 cm   Neurological:     Mental Status: She is alert. Mental status is at baseline.  Psychiatric:        Mood and Affect: Mood normal.         ASSESSMENT/ PLAN:  TODAY  Quadriplegia c1-c4 incomplete Insulin  long term use Major depression recurrent chronic  Will continue current medications Will continue current plan of care Will continue to monitor his status.   Time spent with patient: 40 minutes medications; dietary; plan of care.    Barnie Seip NP [facility]  call [phone]

## 2024-07-15 LAB — URINE CULTURE

## 2024-07-20 ENCOUNTER — Encounter: Payer: Self-pay | Admitting: Adult Health

## 2024-07-20 ENCOUNTER — Other Ambulatory Visit (HOSPITAL_COMMUNITY): Payer: Self-pay

## 2024-07-20 ENCOUNTER — Telehealth: Payer: Self-pay

## 2024-07-20 ENCOUNTER — Non-Acute Institutional Stay (SKILLED_NURSING_FACILITY): Payer: Self-pay | Admitting: Adult Health

## 2024-07-20 DIAGNOSIS — E43 Unspecified severe protein-calorie malnutrition: Secondary | ICD-10-CM | POA: Diagnosis not present

## 2024-07-20 DIAGNOSIS — F419 Anxiety disorder, unspecified: Secondary | ICD-10-CM | POA: Diagnosis not present

## 2024-07-20 DIAGNOSIS — I700 Atherosclerosis of aorta: Secondary | ICD-10-CM

## 2024-07-20 NOTE — Telephone Encounter (Signed)
 Pharmacy Patient Advocate Encounter   Received notification from Onbase that prior authorization for Lantus  Solostar 100 is required/requested.   Insurance verification completed.   The patient is insured through [employer].   Per test claim: The current 30 day co-pay is, $0.00.  No PA needed at this time. This test claim was processed through [facility]- copay amounts may vary at other pharmacies due to pharmacy/plan contracts, or as the patient moves through the different stages of their insurance plan.

## 2024-07-20 NOTE — Progress Notes (Signed)
 Location:  [facility] Room Number: [id number] Place of Service:  SNF (31)   CODE STATUS: dnr  Allergies  Allergen Reactions   Ace Inhibitors Other (See Comments)    Hyperkalemia    Codeine Other (See Comments)    Unknown    Sulfa Antibiotics Other (See Comments)    Unknown     Chief Complaint  Patient presents with   Medical Management of Chronic Issues              Chronic anxiety: Protein calorie malnutrition severe:  Aortic atherosclerosis    HPI:  She is a 88 y.o. long term resident of this facility being seen for the management of her chronic illnesses:Chronic anxiety: Protein calorie malnutrition severe:  Aortic atherosclerosis. There are no reports of uncontrolled pain; she continues to slowly lose weight; there have been no reports of seizure activities.    Past Medical History:  Diagnosis Date   Acute cystitis without hematuria    Adult failure to thrive    Anemia    Anxiety    Atherosclerosis of aorta    Central cord syndrome at C4 level of cervical spinal cord, subsequent encounter (HCC)    Chronic kidney disease, stage IV (severe) (HCC)    CKD (chronic kidney disease)    stage 3   Depression    DM type 2 with diabetic peripheral neuropathy (HCC)    Dysphagia    GERD (gastroesophageal reflux disease)    Gout    High cholesterol    HTN (hypertension)    Hyponatremia    MDD (major depressive disorder)    Neck pain    Neuropathy    Quadriplegia, C1-C4 incomplete (HCC)    Radiculopathy    RLS (restless legs syndrome)    Unspecified convulsions (HCC)    Urinary retention     Past Surgical History:  Procedure Laterality Date   ABDOMINAL HYSTERECTOMY     ANTERIOR CERVICAL DECOMP/DISCECTOMY FUSION N/A 02/05/2021   Procedure: Cervical Three-Four  Anterior cervical decompression/discectomy/fusion;  Surgeon: Gillie Duncans, MD;  Location: [facility] OR;  Service: Neurosurgery;  Laterality: N/A;  RM 20   APPENDECTOMY     BACK SURGERY     BALLOON  DILATION N/A 11/19/2022   Procedure: BALLOON DILATION;  Surgeon: Cindie Carlin POUR, DO;  Location: [facility];  Service: Endoscopy;  Laterality: N/A;   BIOPSY  09/22/2021   Procedure: BIOPSY;  Surgeon: Cindie Carlin POUR, DO;  Location: [facility];  Service: Endoscopy;;   BIOPSY  11/19/2022   Procedure: BIOPSY;  Surgeon: Cindie Carlin POUR, DO;  Location: [facility];  Service: Endoscopy;;   CERVICAL DISC SURGERY     ESOPHAGOGASTRODUODENOSCOPY (EGD) WITH PROPOFOL  N/A 09/22/2021   Procedure: ESOPHAGOGASTRODUODENOSCOPY (EGD) WITH PROPOFOL ;  Surgeon: Cindie Carlin POUR, DO;  Location: [facility];  Service: Endoscopy;  Laterality: N/A;  1:00pm   ESOPHAGOGASTRODUODENOSCOPY (EGD) WITH PROPOFOL  N/A 11/19/2022   Procedure: ESOPHAGOGASTRODUODENOSCOPY (EGD) WITH PROPOFOL ;  Surgeon: Cindie Carlin POUR, DO;  Location: [facility];  Service: Endoscopy;  Laterality: N/A;  11:30 am, asa 3   FLEXIBLE SIGMOIDOSCOPY  09/20/2023   Procedure: FLEXIBLE SIGMOIDOSCOPY;  Surgeon: Cindie Carlin POUR, DO;  Location: [facility];  Service: Endoscopy;;   HAND SURGERY     KNEE SURGERY      Social History   Socioeconomic History   Marital status: Widowed    Spouse name: Not on file   Number of children: Not on file  Years of education: Not on file   Highest education level: Not on file  Occupational History   Not on file  Tobacco Use   Smoking status: Never   Smokeless tobacco: Never  Vaping Use   Vaping status: Never Used  Substance and Sexual Activity   Alcohol use: Never   Drug use: Never   Sexual activity: Not Currently  Other Topics Concern   Not on file  Social History Narrative   Lives at [facility]   Social Drivers of Health   Financial Resource Strain: Not on file  Food Insecurity: No Food Insecurity (02/20/2023)   Hunger Vital Sign    Worried About Running Out of Food in the Last Year: Never true    Ran Out of Food in the Last Year: Never true  Transportation Needs: No  Transportation Needs (02/20/2023)   PRAPARE - Administrator, Civil Service (Medical): No    Lack of Transportation (Non-Medical): No  Physical Activity: Not on file  Stress: Not on file  Social Connections: Not on file  Intimate Partner Violence: Not At Risk (02/20/2023)   Humiliation, Afraid, Rape, and Kick questionnaire    Fear of Current or Ex-Partner: No    Emotionally Abused: No    Physically Abused: No    Sexually Abused: No   Family History  Problem Relation Age of Onset   Cancer Mother    Cardiomyopathy Father    Seizures Sister        childhood   Seizures Brother        in his 74s   Colon cancer Neg Hx       VITAL SIGNS BP (!) 118/53   Pulse (!) 57   Temp (!) 96.9 F (36.1 C)   Resp 16   Ht 5' 2 (1.575 m)   Wt 154 lb 8 oz (70.1 kg)   SpO2 94%   BMI 28.26 kg/m   Outpatient Encounter Medications as of 07/20/2024  Medication Sig Note   acetaminophen  (TYLENOL ) 325 MG tablet Take 650 mg by mouth 3 (three) times daily.    aluminum-magnesium  hydroxide 200-200 MG/5ML suspension Take 15 mLs by mouth every 6 (six) hours as needed (Indigestion and heartburn).    AMBULATORY NON FORMULARY MEDICATION Medication Name:  Continue monthly catheter changes with 71fr catheter at skilled nursing facility.    amLODipine (NORVASC) 10 MG tablet Take 10 mg by mouth daily.    ascorbic acid (VITAMIN C) 500 MG tablet Take 500 mg by mouth daily.    bisacodyl  (DULCOLAX) 10 MG suppository Place 10 mg rectally every Thursday.    camphor-menthol (SARNA) lotion Apply 1 Application topically 2 (two) times daily as needed for itching.    Continuous Glucose Receiver (FREESTYLE LIBRE 2 READER) DEVI by Does not apply route. Friday    Continuous Glucose Sensor (FREESTYLE LIBRE 2 SENSOR) MISC     dapagliflozin  propanediol (FARXIGA ) 5 MG TABS tablet Take 5 mg by mouth daily.    dronabinol (MARINOL) 2.5 MG capsule Take 1 capsule (2.5 mg total) by mouth daily before lunch for 27 days.     DULoxetine  (CYMBALTA ) 20 MG capsule Take 20 mg by mouth daily.    famotidine (PEPCID) 20 MG tablet Take 20 mg by mouth in the morning.    ferrous sulfate  325 (65 FE) MG EC tablet Take 325 mg by mouth 3 (three) times a week. Monday, Wednesday, and Friday.    fluconazole (DIFLUCAN) 200 MG tablet Take 200 mg  by mouth daily. Yeast in urine    folic acid  (FOLVITE ) 1 MG tablet Take 1 mg by mouth daily.    gabapentin  (NEURONTIN ) 300 MG capsule Take 300 mg by mouth 2 (two) times daily.    insulin  aspart (NOVOLOG  FLEXPEN) 100 UNIT/ML FlexPen Inject 5 Units into the skin 3 (three) times daily with meals. 8 am, 12 pm, and 6 pm  Hold if pt eats <50% of meals    insulin  glargine (LANTUS  SOLOSTAR) 100 UNIT/ML Solostar Pen Inject 20 Units into the skin at bedtime.    Insulin  Pen Needle 30G X 5 MM MISC 1 Device by Does not apply route daily. 3/16    ipratropium-albuterol  (DUONEB) 0.5-2.5 (3) MG/3ML SOLN Take 3 mLs by nebulization every 6 (six) hours as needed (wheezing).    lamoTRIgine  (LAMICTAL ) 150 MG tablet Take 150 mg by mouth 2 (two) times daily.    levETIRAcetam  (KEPPRA ) 500 MG tablet Take 500 mg by mouth 2 (two) times daily. Renal dose    linaclotide  (LINZESS ) 72 MCG capsule Take 72 mcg by mouth daily.    loratadine (CLARITIN) 10 MG tablet Take 10 mg by mouth daily as needed for allergies.    Magnesium  Hydroxide (MILK OF MAGNESIA PO) Take 30 mLs by mouth as needed (indigestion).    melatonin 5 MG TABS Take 5 mg by mouth at bedtime.    midazolam  (VERSED ) 10 MG/2ML SOLN injection Inject 0.4 mLs (2 mg total) into the muscle every 15 (fifteen) minutes as needed for agitation. (Patient taking differently: Inject 2 mg into the muscle every 15 (fifteen) minutes as needed for agitation. Special Instructions: Administer 10mg  (one 2ml vial) IM every 15 minutes  up to (4) total doses for seizures lasting >1 minute -)    ondansetron  (ZOFRAN ) 4 MG/5ML solution Take 4 mg by mouth every 8 (eight) hours as needed for  nausea or vomiting.    Phenylephrine -Cocoa Butter 0.25-85.5 % SUPP Place 1 suppository rectally 2 (two) times daily as needed (hemorrhoids). 05/16/2024: LD >30 days ago, 06/28/2023   rosuvastatin  (CRESTOR ) 20 MG tablet Take 20 mg by mouth at bedtime.    SANTYL 250 UNIT/GM ointment Apply 1 Application topically daily.  Apply to sacral wound daily per tx orders.    sennosides-docusate sodium  (SENOKOT-S) 8.6-50 MG tablet Take 2 tablets by mouth at bedtime.    sodium bicarbonate  650 MG tablet Take 1 tablet (650 mg total) by mouth 2 (two) times daily.    solifenacin (VESICARE) 5 MG tablet Take 5 mg by mouth at bedtime.    zinc oxide 20 % ointment Apply 1 Application topically as directed. Every shift as needed    No facility-administered encounter medications on file as of 07/20/2024.     SIGNIFICANT DIAGNOSTIC EXAMS   LABS REVIEWED PREVIOUS         07-26-23: glucose 39; bun 30; creat 1.54; k+ 4.7; na++ 140; ca 9.0; gfr 33 hgb A1c 7.1  09-21-23: glucose 110 bun 23; creat 1.41; k+ 4.8; na++ 130; ca 9.0; gfr 37; mag 2.0  10-12-23: ACR 1050. 11-09-23: glucose 93; bun 27; creat 1.83; k+ 4.2; na++ 137; ca 9.2; gfr 27 11-22-23: wbc 4.6; hgb 8.5; hct 26.2; mcv 95.3 plt 216; glucose 169; bun 39; creat 1.58; k+ 4.5; na++ 135; ca 9.3 gfr 32; protein 6.8 albumin  3.3; hgb A1c 6.0  urine microalbumin 966.9   TODAY  02-14-24: wbc 4.0; hgb 7.9; hct 24.7; mcv 994.3 plt 186; glucose 103; bun 24; creat 1.75; k+ 4.3; na++  131; ca 8.5 gfr 28; protein 6.3 albumin  3.0; hgb A1c 6.3; rpr: nr; vitamin B 12: 408; folate 2.9; iron 49; tsh 3.293; chol 98; ldl 40; trig 136; hdl 31  89-80-74: wbc 5.0; hgb 8.7; hct 27.5; mcv 101.9; plt 197; glucose 226; bun 44; creat 2.18; k+ 4.6; na++ 140; ca 11.7; gfr 22 06-19-24: hgb A1c 6.7 06-20-24: pth <6; folate >20; vitamin B 12: 1090 07-11-24: wbc 4.6; hgb 7.4; hct 23.3; mcv 101.3 plt 230; glucose 221; bun 48; creat 2.06 ;k+ 4.8; na++ 138; ca 9.4; gfr 23 07-13-24: urine culture: mixed  species   Review of Systems  Constitutional:  Negative for malaise/fatigue.  Respiratory:  Negative for cough and shortness of breath.   Cardiovascular:  Negative for chest pain, palpitations and leg swelling.  Gastrointestinal:  Negative for abdominal pain, constipation and heartburn.  Musculoskeletal:  Negative for back pain, joint pain and myalgias.  Skin: Negative.   Neurological:  Negative for dizziness.  Psychiatric/Behavioral:  The patient is not nervous/anxious.     Physical Exam Constitutional:      General: She is not in acute distress.    Appearance: She is well-developed. She is not diaphoretic.  Neck:     Thyroid : No thyromegaly.  Cardiovascular:     Rate and Rhythm: Normal rate and regular rhythm.     Heart sounds: Normal heart sounds.  Pulmonary:     Effort: Pulmonary effort is normal. No respiratory distress.     Breath sounds: Normal breath sounds.  Abdominal:     General: Bowel sounds are normal. There is no distension.     Palpations: Abdomen is soft.     Tenderness: There is no abdominal tenderness.  Genitourinary:    Comments: Foley Musculoskeletal:     Cervical back: Neck supple.     Right lower leg: No edema.     Left lower leg: No edema.     Comments: Limited range of motion in upper extremities Does not move lower extremities    Lymphadenopathy:     Cervical: No cervical adenopathy.  Skin:    General: Skin is warm and dry.     Comments: Sacrum: 4 x 5 x 0.2cm  Neurological:     Mental Status: She is alert. Mental status is at baseline.  Psychiatric:        Mood and Affect: Mood normal.        ASSESSMENT/ PLAN:  TODAY  Chronic anxiety: is off medications  2. Protein calorie malnutrition severe: albumin  3.3; protein 6.8; will continue supplements as directed  3. Aortic atherosclerosis (ct 01-26-21) is on asa and statin  PREVIOUS   4. Central cord syndrome at C4 level of cervical spine compression subsequent encounter/code compression  quadriplegia C1-4 (01-25-21) asa 81 mg daily.  5. Neurocognitive deficits: 03-22-23 SLUMS 14/30  6. Restless leg syndrome: will continue requip  1 mg nightly  7. Hypertension associated with stage 4 chronic kidney disease due to type 2 diabetes mellitus: b/p 118/53 will continue norvasc 10 mg daily   8. Seizure: will continue keppra  500 mg twice daily; will continue lamictal  150 mg twice daily   9. Hyperlipidemia associated with type 2 diabetes mellitus: ldl 73; will continue crestor  20 mg daily   10. Post menopausal osteoporosis t score -3.391  11. 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 Weight loss: current weight is 154 pounds  13. GERD without esophagitis: is presently off medications.   14. Chronic constipation: will continue miralax  daily and linzess  72 mcg daily and senna s daily  15. Lumbar radicular syndrome/avascular necrosis bilateral hips: will continue tylenol  650 mg every 8 hours; gabapentin  300 mg three times daily and cymbalta  20 mg daily   16. Diabetic peripheral neuropathy: will continue gabapentin  300 mg three times daily    17. CKD stage 4 due to type 2 diabetes mellitus: bun 48; creat 2.06; gfr 23  18. Anemia associated with type 2 diabetes mellitus due to underlying condition: hgb 7.4 will continue iron three times weekly  19. Urine retention/neurogenic bladder: has long term foley is followed by urology    Barnie Seip NP [facility]  call [phone]

## 2024-07-28 ENCOUNTER — Encounter (HOSPITAL_COMMUNITY)
Admission: RE | Admit: 2024-07-28 | Discharge: 2024-07-28 | Disposition: A | Discharge status: Home / Self Care | Source: Skilled Nursing Facility | Attending: Internal Medicine | Admitting: Internal Medicine

## 2024-07-28 DIAGNOSIS — K5900 Constipation, unspecified: Secondary | ICD-10-CM | POA: Insufficient documentation

## 2024-07-28 DIAGNOSIS — N390 Urinary tract infection, site not specified: Secondary | ICD-10-CM | POA: Insufficient documentation

## 2024-07-28 LAB — URINALYSIS, COMPLETE (UACMP) WITH MICROSCOPIC

## 2024-07-29 ENCOUNTER — Observation Stay (HOSPITAL_COMMUNITY)

## 2024-07-29 ENCOUNTER — Inpatient Hospital Stay (HOSPITAL_COMMUNITY)
Admission: EM | Admit: 2024-07-29 | Discharge: 2024-08-02 | DRG: 698 | Disposition: A | Discharge status: Skilled Nursing Facility | Source: Skilled Nursing Facility | Attending: Family Medicine | Admitting: Family Medicine

## 2024-07-29 ENCOUNTER — Other Ambulatory Visit: Payer: Self-pay

## 2024-07-29 ENCOUNTER — Other Ambulatory Visit (HOSPITAL_COMMUNITY)
Admission: RE | Admit: 2024-07-29 | Discharge: 2024-07-29 | Disposition: A | Discharge status: Home / Self Care | Source: Skilled Nursing Facility | Attending: Internal Medicine | Admitting: Internal Medicine

## 2024-07-29 ENCOUNTER — Encounter (HOSPITAL_COMMUNITY): Payer: Self-pay | Admitting: Emergency Medicine

## 2024-07-29 DIAGNOSIS — N3001 Acute cystitis with hematuria: Secondary | ICD-10-CM | POA: Diagnosis not present

## 2024-07-29 DIAGNOSIS — N390 Urinary tract infection, site not specified: Secondary | ICD-10-CM | POA: Diagnosis present

## 2024-07-29 DIAGNOSIS — R310 Gross hematuria: Secondary | ICD-10-CM | POA: Diagnosis present

## 2024-07-29 DIAGNOSIS — R7889 Finding of other specified substances, not normally found in blood: Secondary | ICD-10-CM | POA: Diagnosis not present

## 2024-07-29 DIAGNOSIS — E1122 Type 2 diabetes mellitus with diabetic chronic kidney disease: Secondary | ICD-10-CM

## 2024-07-29 DIAGNOSIS — I10 Essential (primary) hypertension: Secondary | ICD-10-CM | POA: Diagnosis not present

## 2024-07-29 DIAGNOSIS — G8252 Quadriplegia, C1-C4 incomplete: Secondary | ICD-10-CM | POA: Diagnosis present

## 2024-07-29 DIAGNOSIS — R569 Unspecified convulsions: Secondary | ICD-10-CM | POA: Diagnosis not present

## 2024-07-29 DIAGNOSIS — N184 Chronic kidney disease, stage 4 (severe): Secondary | ICD-10-CM | POA: Diagnosis not present

## 2024-07-29 DIAGNOSIS — R627 Adult failure to thrive: Secondary | ICD-10-CM | POA: Insufficient documentation

## 2024-07-29 DIAGNOSIS — D649 Anemia, unspecified: Secondary | ICD-10-CM | POA: Diagnosis not present

## 2024-07-29 DIAGNOSIS — E1140 Type 2 diabetes mellitus with diabetic neuropathy, unspecified: Secondary | ICD-10-CM | POA: Diagnosis present

## 2024-07-29 DIAGNOSIS — L8990 Pressure ulcer of unspecified site, unspecified stage: Secondary | ICD-10-CM | POA: Insufficient documentation

## 2024-07-29 DIAGNOSIS — K922 Gastrointestinal hemorrhage, unspecified: Principal | ICD-10-CM

## 2024-07-29 DIAGNOSIS — Z978 Presence of other specified devices: Secondary | ICD-10-CM

## 2024-07-29 DIAGNOSIS — R4182 Altered mental status, unspecified: Secondary | ICD-10-CM | POA: Diagnosis not present

## 2024-07-29 DIAGNOSIS — N319 Neuromuscular dysfunction of bladder, unspecified: Secondary | ICD-10-CM | POA: Diagnosis present

## 2024-07-29 DIAGNOSIS — Z66 Do not resuscitate: Secondary | ICD-10-CM | POA: Diagnosis present

## 2024-07-29 LAB — GLUCOSE, CAPILLARY
Glucose-Capillary: 148 mg/dL — ABNORMAL HIGH (ref 70–99)
Glucose-Capillary: 142 mg/dL — ABNORMAL HIGH (ref 70–99)
Glucose-Capillary: 136 mg/dL — ABNORMAL HIGH (ref 70–99)

## 2024-07-29 LAB — BLOOD GAS, ARTERIAL
Acid-Base Excess: 1.5 mmol/L (ref 0.0–2.0)
Bicarbonate: 25.9 mmol/L (ref 20.0–28.0)
Drawn by: 23430
O2 Saturation: 100 %
Patient temperature: 36.4
pCO2 arterial: 38 mmHg (ref 32–48)
pH, Arterial: 7.44 (ref 7.35–7.45)
pO2, Arterial: 85 mmHg (ref 83–108)

## 2024-07-29 LAB — MAGNESIUM: Magnesium: 3.8 mg/dL — ABNORMAL HIGH (ref 1.7–2.4)

## 2024-07-29 LAB — URINALYSIS, ROUTINE W REFLEX MICROSCOPIC

## 2024-07-29 LAB — URINALYSIS, MICROSCOPIC (REFLEX)
RBC / HPF: >50 RBC/hpf (ref 0–5)
WBC, UA: >50 WBC/hpf (ref 0–5)

## 2024-07-29 LAB — COMPREHENSIVE METABOLIC PANEL WITH GFR
ALT: 18 U/L (ref 0–44)
AST: 30 U/L (ref 15–41)
Albumin: 3.2 g/dL — ABNORMAL LOW (ref 3.5–5.0)
Alkaline Phosphatase: 81 U/L (ref 38–126)
Anion gap: 12 (ref 5–15)
BUN: 93 mg/dL — ABNORMAL HIGH (ref 8–23)
CO2: 25 mmol/L (ref 22–32)
Calcium: 8.7 mg/dL — ABNORMAL LOW (ref 8.9–10.3)
Chloride: 101 mmol/L (ref 98–111)
Creatinine, Ser: 2.72 mg/dL — ABNORMAL HIGH (ref 0.44–1.00)
GFR, Estimated: 17 mL/min — ABNORMAL LOW (ref >60)
Glucose, Bld: 175 mg/dL — ABNORMAL HIGH (ref 70–99)
Potassium: 5.8 mmol/L — ABNORMAL HIGH (ref 3.5–5.1)
Sodium: 138 mmol/L (ref 135–145)
Total Bilirubin: <0.2 mg/dL (ref 0.0–1.2)
Total Protein: 6.5 g/dL (ref 6.5–8.1)

## 2024-07-29 LAB — CBC WITH DIFFERENTIAL/PLATELET
Abs Immature Granulocytes: 0.02 K/uL (ref 0.00–0.07)
Basophils Absolute: 0 K/uL (ref 0.0–0.1)
Basophils Relative: 0 %
Eosinophils Absolute: 0 K/uL (ref 0.0–0.5)
Eosinophils Relative: 0 %
HCT: 21.8 % — ABNORMAL LOW (ref 36.0–46.0)
Hemoglobin: 6.9 g/dL — CL (ref 12.0–15.0)
Immature Granulocytes: 0 %
Lymphocytes Relative: 26 %
Lymphs Abs: 2.3 K/uL (ref 0.7–4.0)
MCH: 31.9 pg (ref 26.0–34.0)
MCHC: 31.7 g/dL (ref 30.0–36.0)
MCV: 100.9 fL — ABNORMAL HIGH (ref 80.0–100.0)
Monocytes Absolute: 0.4 K/uL (ref 0.1–1.0)
Monocytes Relative: 5 %
Neutro Abs: 5.9 K/uL (ref 1.7–7.7)
Neutrophils Relative %: 69 %
Platelets: 317 K/uL (ref 150–400)
RBC: 2.16 MIL/uL — ABNORMAL LOW (ref 3.87–5.11)
RDW: 14.4 % (ref 11.5–15.5)
WBC: 8.7 K/uL (ref 4.0–10.5)
nRBC: 0 % (ref 0.0–0.2)

## 2024-07-29 LAB — VITAMIN B12: Vitamin B-12: 1296 pg/mL — ABNORMAL HIGH (ref 180–914)

## 2024-07-29 LAB — CBC
HCT: 21.4 % — ABNORMAL LOW (ref 36.0–46.0)
Hemoglobin: 6.7 g/dL — CL (ref 12.0–15.0)
MCH: 31.6 pg (ref 26.0–34.0)
MCHC: 31.3 g/dL (ref 30.0–36.0)
MCV: 100.9 fL — ABNORMAL HIGH (ref 80.0–100.0)
Platelets: 323 K/uL (ref 150–400)
RBC: 2.12 MIL/uL — ABNORMAL LOW (ref 3.87–5.11)
RDW: 14.6 % (ref 11.5–15.5)
WBC: 8.4 K/uL (ref 4.0–10.5)
nRBC: 0 % (ref 0.0–0.2)

## 2024-07-29 LAB — IRON AND TIBC
Iron: 32 ug/dL (ref 28–170)
Saturation Ratios: 16 % (ref 10.4–31.8)
TIBC: 202 ug/dL — ABNORMAL LOW (ref 250–450)
UIBC: 170 ug/dL

## 2024-07-29 LAB — HEMOGLOBIN AND HEMATOCRIT, BLOOD
HCT: 27 % — ABNORMAL LOW (ref 36.0–46.0)
Hemoglobin: 8.7 g/dL — ABNORMAL LOW (ref 12.0–15.0)

## 2024-07-29 LAB — FERRITIN: Ferritin: 1276 ng/mL — ABNORMAL HIGH (ref 11–307)

## 2024-07-29 LAB — CBG MONITORING, ED: Glucose-Capillary: 189 mg/dL — ABNORMAL HIGH (ref 70–99)

## 2024-07-29 LAB — FOLATE: Folate: >20 ng/mL (ref >5.9)

## 2024-07-29 LAB — PROTIME-INR
INR: 1 (ref 0.8–1.2)
Prothrombin Time: 14.1 s (ref 11.4–15.2)

## 2024-07-29 LAB — AMMONIA: Ammonia: 27 umol/L (ref 9–35)

## 2024-07-29 LAB — POC OCCULT BLOOD, ED

## 2024-07-29 LAB — PREPARE RBC (CROSSMATCH)

## 2024-07-29 MED ORDER — COLLAGENASE 250 UNIT/GM EX OINT
TOPICAL_OINTMENT | Freq: Every day | CUTANEOUS | Status: DC
  Filled 2024-07-29: qty 30

## 2024-07-29 MED ORDER — FESOTERODINE FUMARATE ER 4 MG PO TB24
4.0000 mg | ORAL_TABLET | Freq: Every day | ORAL | Status: DC
  Administered 2024-07-30 – 2024-08-02 (×4): 4 mg via ORAL
  Filled 2024-07-29 (×5): qty 1

## 2024-07-29 MED ORDER — INSULIN ASPART 100 UNIT/ML IJ SOLN: 0.0000 [IU] | Freq: Every day | INTRAMUSCULAR | Status: DC

## 2024-07-29 MED ORDER — INSULIN ASPART 100 UNIT/ML IJ SOLN
0.0000 [IU] | Freq: Three times a day (TID) | INTRAMUSCULAR | Status: DC
  Administered 2024-07-29: 1 [IU] via SUBCUTANEOUS
  Administered 2024-07-30: 2 [IU] via SUBCUTANEOUS
  Administered 2024-07-31 – 2024-08-02 (×4): 1 [IU] via SUBCUTANEOUS
  Administered 2024-08-02: 2 [IU] via SUBCUTANEOUS
  Filled 2024-07-29 (×7): qty 1

## 2024-07-29 MED ORDER — SODIUM CHLORIDE 0.9 % IV SOLN
1.0000 g | INTRAVENOUS | Status: DC
  Administered 2024-07-29 – 2024-08-01 (×4): 1 g via INTRAVENOUS
  Filled 2024-07-29 (×5): qty 10

## 2024-07-29 MED ORDER — VITAMIN C 500 MG PO TABS
500.0000 mg | ORAL_TABLET | Freq: Every day | ORAL | Status: DC
  Administered 2024-07-30 – 2024-08-02 (×4): 500 mg via ORAL
  Filled 2024-07-29 (×4): qty 1

## 2024-07-29 MED ORDER — LEVETIRACETAM (KEPPRA) 500 MG/5 ML ADULT IV PUSH
500.0000 mg | INTRAVENOUS | Status: DC
  Administered 2024-07-29 – 2024-08-01 (×4): 500 mg via INTRAVENOUS
  Filled 2024-07-29 (×4): qty 5

## 2024-07-29 MED ORDER — SODIUM ZIRCONIUM CYCLOSILICATE 10 G PO PACK
10.0000 g | PACK | Freq: Once | ORAL | Status: AC
  Administered 2024-07-29: 10 g via ORAL
  Filled 2024-07-29: qty 1

## 2024-07-29 MED ORDER — FOLIC ACID 1 MG PO TABS
1.0000 mg | ORAL_TABLET | Freq: Every day | ORAL | Status: DC
  Administered 2024-07-30 – 2024-08-02 (×4): 1 mg via ORAL
  Filled 2024-07-29 (×4): qty 1

## 2024-07-29 MED ORDER — SODIUM CHLORIDE 0.9 % IV SOLN: INTRAVENOUS | Status: AC

## 2024-07-29 MED ORDER — CALCIUM GLUCONATE-NACL 1-0.675 GM/50ML-% IV SOLN
1.0000 g | Freq: Once | INTRAVENOUS | Status: AC
  Administered 2024-07-29: 1000 mg via INTRAVENOUS
  Filled 2024-07-29: qty 50

## 2024-07-29 MED ORDER — PANTOPRAZOLE SODIUM 40 MG IV SOLR
40.0000 mg | Freq: Once | INTRAVENOUS | Status: AC
  Administered 2024-07-29: 40 mg via INTRAVENOUS
  Filled 2024-07-29: qty 10

## 2024-07-29 MED ORDER — SODIUM CHLORIDE 0.9% IV SOLUTION: Freq: Once | INTRAVENOUS | Status: AC

## 2024-07-29 MED ORDER — ACETAMINOPHEN 325 MG PO TABS
650.0000 mg | ORAL_TABLET | Freq: Three times a day (TID) | ORAL | Status: DC
  Administered 2024-07-30 – 2024-08-02 (×9): 650 mg via ORAL
  Filled 2024-07-29 (×10): qty 2

## 2024-07-29 MED ORDER — LINACLOTIDE 145 MCG PO CAPS
145.0000 ug | ORAL_CAPSULE | Freq: Every day | ORAL | Status: DC
  Administered 2024-07-30: 145 ug via ORAL
  Filled 2024-07-29: qty 1

## 2024-07-29 MED ORDER — GABAPENTIN 300 MG PO CAPS
300.0000 mg | ORAL_CAPSULE | Freq: Two times a day (BID) | ORAL | Status: DC
  Administered 2024-07-30 – 2024-08-02 (×7): 300 mg via ORAL
  Filled 2024-07-29 (×8): qty 1

## 2024-07-29 MED ORDER — ROSUVASTATIN CALCIUM 10 MG PO TABS
20.0000 mg | ORAL_TABLET | Freq: Every day | ORAL | Status: DC
  Administered 2024-07-30 – 2024-08-01 (×3): 20 mg via ORAL
  Filled 2024-07-29 (×4): qty 2

## 2024-07-29 MED ORDER — SODIUM BICARBONATE 8.4 % IV SOLN
50.0000 meq | Freq: Once | INTRAVENOUS | Status: AC
  Administered 2024-07-29: 50 meq via INTRAVENOUS
  Filled 2024-07-29: qty 50

## 2024-07-29 MED ORDER — DULOXETINE HCL 20 MG PO CPEP
20.0000 mg | ORAL_CAPSULE | Freq: Every day | ORAL | Status: DC
  Administered 2024-07-30 – 2024-08-02 (×4): 20 mg via ORAL
  Filled 2024-07-29 (×5): qty 1

## 2024-07-29 MED ORDER — PANTOPRAZOLE SODIUM 40 MG IV SOLR
40.0000 mg | Freq: Two times a day (BID) | INTRAVENOUS | Status: DC
  Administered 2024-07-29 – 2024-08-02 (×8): 40 mg via INTRAVENOUS
  Filled 2024-07-29 (×8): qty 10

## 2024-07-29 MED ORDER — LAMOTRIGINE 25 MG PO TABS
150.0000 mg | ORAL_TABLET | Freq: Two times a day (BID) | ORAL | Status: DC
  Administered 2024-07-29 (×2): 150 mg via ORAL
  Filled 2024-07-29 (×4): qty 2

## 2024-07-29 MED ORDER — LEVETIRACETAM 250 MG PO TABS: 250.0000 mg | ORAL_TABLET | Freq: Two times a day (BID) | ORAL | Status: DC

## 2024-07-29 NOTE — Hospital Course (Signed)
 88 year old female with a history of of C4 central cord syndrome with quadriplegia, diabetes mellitus type 2, hypertension, hyperlipidemia, CKD stage IV, MDD, [place], cognitive impairment, constipation, and neurogenic bladder with chronic Foley catheter presenting from [facility] nursing center secondary to low hemoglobin on blood work with Hgb 6.9.  The patient is a poor historian secondary to her cognitive impairment.  History is supplemented by the patient's daughter and review of medical record.  Patient's daughter states that the patient has been a little bit more somnolent for the past month.  At the same time, she states that the patient continues to have is currently decline over the last 6 to 12 months.  Daughter is not aware of any hematochezia or melena.  The patient has not had any fevers, chills, chest pain, shortness breath, hemoptysis, abd pain, vomiting, diarrhea.  P.o. intake has been poor.  At baseline, the patient is essentially bedbound.  In the ED, the patient was afebrile and hemodynamically stable with oxygen saturation 98% on room air.  WBC 8.4, hemoglobin 6.7, platelets 323.  Sodium 138, potassium 4 point, bicarbonate 25, serum creatinine 2.73.  AST 30, ALT 18, capacity 0, total bilirubin <0.2.  EKG shows sinus rhythm nonspecific T wave changes. Hemoccult was positive.  GI was consulted to assist with management.

## 2024-07-29 NOTE — ED Provider Notes (Signed)
 [place] EMERGENCY DEPARTMENT AT [facility] Provider Note   CSN: [id number] Arrival date & time: 07/29/24  9060     Patient presents with: No chief complaint on file.   [name] is a 88 y.o. female.   HPI Patient presents with anemia.  Hemoglobin of 6.9 on blood work done today at [facility].  History of quadriplegia.  Does have what appears to be chronic anemia with hemoglobin in the eights at baseline.  Patient cannot provide a whole lot of history.   Past Medical History:  Diagnosis Date   Acute cystitis without hematuria    Adult failure to thrive    Anemia    Anxiety    Atherosclerosis of aorta    Central cord syndrome at C4 level of cervical spinal cord, subsequent encounter (HCC)    Chronic kidney disease, stage IV (severe) (HCC)    CKD (chronic kidney disease)    stage 3   Depression    DM type 2 with diabetic peripheral neuropathy (HCC)    Dysphagia    GERD (gastroesophageal reflux disease)    Gout    High cholesterol    HTN (hypertension)    Hyponatremia    MDD (major depressive disorder)    Neck pain    Neuropathy    Quadriplegia, C1-C4 incomplete (HCC)    Radiculopathy    RLS (restless legs syndrome)    Unspecified convulsions (HCC)    Urinary retention     Prior to Admission medications   Medication Sig Start Date End Date Taking? Authorizing Provider  acetaminophen  (TYLENOL ) 325 MG tablet Take 650 mg by mouth 3 (three) times daily.    [provider]  aluminum-magnesium  hydroxide 200-200 MG/5ML suspension Take 15 mLs by mouth every 6 (six) hours as needed (Indigestion and heartburn).    [provider]  AMBULATORY NON FORMULARY MEDICATION Medication Name:  Continue monthly catheter changes with 6fr catheter at skilled nursing facility. 08/12/21   McKenzie, Belvie CROME, MD  amLODipine (NORVASC) 10 MG tablet Take 10 mg by mouth daily.    [provider]  ascorbic acid (VITAMIN C) 500 MG tablet Take 500 mg  by mouth daily.    [provider]  bisacodyl  (DULCOLAX) 10 MG suppository Place 10 mg rectally every Thursday.    [provider]  camphor-menthol VIKKI) lotion Apply 1 Application topically 2 (two) times daily as needed for itching.    [provider]  Continuous Glucose Receiver (FREESTYLE LIBRE 2 READER) DEVI by Does not apply route. Friday    [provider]  Continuous Glucose Sensor (FREESTYLE LIBRE 2 SENSOR) MISC  02/22/24   [provider]  dapagliflozin  propanediol (FARXIGA ) 5 MG TABS tablet Take 5 mg by mouth daily.    [provider]  dronabinol  (MARINOL ) 2.5 MG capsule Take 1 capsule (2.5 mg total) by mouth daily before lunch for 27 days. 07/07/24 08/03/24  [name], NP  DULoxetine  (CYMBALTA ) 20 MG capsule Take 20 mg by mouth daily.    [provider]  famotidine (PEPCID) 20 MG tablet Take 20 mg by mouth in the morning.    [provider]  ferrous sulfate  325 (65 FE) MG EC tablet Take 325 mg by mouth 3 (three) times a week. Monday, Wednesday, and Friday.    [provider]  fluconazole (DIFLUCAN) 200 MG tablet Take 200 mg by mouth daily. Yeast in urine    [provider]  folic acid  (FOLVITE ) 1  MG tablet Take 1 mg by mouth daily.    [provider]  gabapentin  (NEURONTIN ) 300 MG capsule Take 300 mg by mouth 2 (two) times daily.    [provider]  insulin  aspart (NOVOLOG  FLEXPEN) 100 UNIT/ML FlexPen Inject 5 Units into the skin 3 (three) times daily with meals. 8 am, 12 pm, and 6 pm  Hold if pt eats <50% of meals    [provider]  insulin  glargine (LANTUS  SOLOSTAR) 100 UNIT/ML Solostar Pen Inject 20 Units into the skin at bedtime.    [provider]  Insulin  Pen Needle 30G X 5 MM MISC 1 Device by Does not apply route daily. 3/16    [name], NP  ipratropium-albuterol  (DUONEB) 0.5-2.5 (3) MG/3ML SOLN Take 3 mLs by nebulization every 6 (six) hours  as needed (wheezing).    [provider]  lamoTRIgine  (LAMICTAL ) 150 MG tablet Take 150 mg by mouth 2 (two) times daily.    [provider]  levETIRAcetam  (KEPPRA ) 500 MG tablet Take 500 mg by mouth 2 (two) times daily. Renal dose    [provider]  linaclotide  (LINZESS ) 72 MCG capsule Take 72 mcg by mouth daily.    [provider]  loratadine (CLARITIN) 10 MG tablet Take 10 mg by mouth daily as needed for allergies.    [provider]  Magnesium  Hydroxide (MILK OF MAGNESIA PO) Take 30 mLs by mouth as needed (indigestion).    [provider]  melatonin 5 MG TABS Take 5 mg by mouth at bedtime.    [provider]  midazolam  (VERSED ) 10 MG/2ML SOLN injection Inject 0.4 mLs (2 mg total) into the muscle every 15 (fifteen) minutes as needed for agitation. Patient taking differently: Inject 2 mg into the muscle every 15 (fifteen) minutes as needed for agitation. Special Instructions: Administer 10mg  (one 2ml vial) IM every 15 minutes  up to (4) total doses for seizures lasting >1 minute - 04/24/24   [name], NP  ondansetron  (ZOFRAN ) 4 MG/5ML solution Take 4 mg by mouth every 8 (eight) hours as needed for nausea or vomiting.    [provider]  Phenylephrine -Cocoa Butter 0.25-85.5 % SUPP Place 1 suppository rectally 2 (two) times daily as needed (hemorrhoids).    [provider]  rosuvastatin  (CRESTOR ) 20 MG tablet Take 20 mg by mouth at bedtime.    [provider]  SANTYL 250 UNIT/GM ointment Apply 1 Application topically daily.  Apply to sacral wound daily per tx orders. 06/27/24   [provider]  sennosides-docusate sodium  (SENOKOT-S) 8.6-50 MG tablet Take 2 tablets by mouth at bedtime.    [provider]  sodium bicarbonate  650 MG tablet Take 1 tablet (650 mg total) by mouth 2 (two) times daily. 02/24/23   Adhikari, Amrit, MD  solifenacin (VESICARE) 5 MG tablet Take 5 mg by mouth at  bedtime.    [provider]  zinc oxide 20 % ointment Apply 1 Application topically as directed. Every shift as needed    [provider]    Allergies: Ace inhibitors, Codeine, and Sulfa antibiotics    Review of Systems  Updated Vital Signs BP (!) 111/47 (BP Location: Right Arm)   Pulse 78   Temp 97.9 F (36.6 C) (Oral)   Resp 16   Ht 5' 2 (1.575 m)   Wt 69.4 kg   SpO2 98%   BMI 27.98 kg/m   Physical Exam Vitals reviewed.  Cardiovascular:  Rate and Rhythm: Regular rhythm.  Abdominal:     Tenderness: There is no abdominal tenderness.  Genitourinary:    Comments: Foley catheter in place with dark urine.  Black stool on rectal exam. Musculoskeletal:     Cervical back: Neck supple.  Neurological:     Mental Status: She is alert.     Comments: Difficult to understand.     (all labs ordered are listed, but only abnormal results are displayed) Labs Reviewed  COMPREHENSIVE METABOLIC PANEL WITH GFR - Abnormal; Notable for the following components:      Result Value   Potassium 5.8 (*)    Glucose, Bld 175 (*)    BUN 93 (*)    Creatinine, Ser 2.72 (*)    Calcium  8.7 (*)    Albumin  3.2 (*)    GFR, Estimated 17 (*)    All other components within normal limits  CBC - Abnormal; Notable for the following components:   RBC 2.12 (*)    Hemoglobin 6.7 (*)    HCT 21.4 (*)    MCV 100.9 (*)    All other components within normal limits  URINALYSIS, ROUTINE W REFLEX MICROSCOPIC  CBG MONITORING, ED  POC OCCULT BLOOD, ED  TYPE AND SCREEN  PREPARE RBC (CROSSMATCH)    EKG: EKG Interpretation Date/Time:  Saturday July 29 2024 09:58:19 EST Ventricular Rate:  76 PR Interval:  170 QRS Duration:  82 QT Interval:  406 QTC Calculation: 457 R Axis:   35  Text Interpretation: Sinus rhythm Nonspecific T abnrm, anterolateral leads Confirmed by Patsey Lot [phone]) on 07/29/2024 10:35:47 AM  Radiology: No results found.   Procedures   Medications  Ordered in the ED  pantoprazole  (PROTONIX ) injection 40 mg (has no administration in time range)  0.9 %  sodium chloride  infusion (Manually program via Guardrails IV Fluids) (has no administration in time range)                                    Medical Decision Making Amount and/or Complexity of Data Reviewed Labs: ordered.  Risk Prescription drug management. Decision regarding hospitalization.   Patient with anemia.  History of some chronic anemia but hemoglobin has decreased according to outside labs.  Differential diagnosis includes lab error but also various causes of the anemia including GI bleed, decreased production, hematuria.  Urine does look very dark and also had black stool.  If no clear source found potential transfusion and discharged back to nursing home however if GI bleed is present would likely admit.  Open is low.  Guaiac was positive.  I think with anemia and GI bleed patient benefit from mission to the hospital.  Will transfuse 1 unit at this time.  Discussed with Dr. Eartha from GI.  Recommended iron studies.  Will add anemia panel.  Will discuss with hospitalist for admission.   CRITICAL CARE Performed by: Lot Patsey Total critical care time: 30 minutes Critical care time was exclusive of separately billable procedures and treating other patients. Critical care was necessary to treat or prevent imminent or life-threatening deterioration. Critical care was time spent personally by me on the following activities: development of treatment plan with patient and/or surrogate as well as nursing, discussions with consultants, evaluation of patient's response to treatment, examination of patient, obtaining history from patient or surrogate, ordering and performing treatments and interventions, ordering and review of laboratory studies, ordering and review of radiographic  studies, pulse oximetry and re-evaluation of patient's condition.      Final  diagnoses:  Upper GI bleed    ED Discharge Orders     None          Patsey Lot, MD 07/29/24 1052

## 2024-07-29 NOTE — Consult Note (Signed)
 WOC Nurse Consult Note: Reason for Consult: sacral wound  Wound type: Unstageable Pressure Injury sacrum  Pressure Injury POA: Yes Measurement:see nursing flowsheet  Wound bed: 70% tan necrotic 30% red granulation tissue   Drainage (amount, consistency, odor) see nursing flowsheet  Periwound: pink scar tissue  Dressing procedure/placement/frequency: Cleanse sacral wound with Vashe wound cleanser, do not rinse, allow to air dry. Apply 1/4 thick layer of Santyl to wound bed daily, cover with saline moistened gauze and dry gauze.  Secure with silicone foam or ABD pad and clothe tape whichever is preferred.   Patient should be placed on a low air loss mattress for pressure redistribution and moisture management.   POC discussed with bedside nurse. WOC team will not follow  Reconsult if further needs arise.   Thank you,    Powell Bar MSN, RN-BC, [employer]

## 2024-07-29 NOTE — Progress Notes (Signed)
 Patient arrived to unit at approximately 1205. Patient was drowsy but would arouse to tell me her name. Patient daughter at bedside and patient was aware she was in room, pt mumbling. Blood started at 1306 per order. Additional iv site attempted at 1350 and patient did not react to painful stimuli and with hard sternal rub would wince but not fully arouse. MD made aware and Charge RN. Orders placed to transfer patient to ICU bed 5. Blood gas obtained as well as transported to CT for ordered head CT in route to ICU. Pt did wince and groan when transferred to and from CT table. Bedside report given to [facility]. Family in waiting area of ICU.

## 2024-07-29 NOTE — H&P (Addendum)
 History and Physical    Patient: [name] FMW:[id number] DOB: Dec 12, 1935 DOA: 07/29/2024 DOS: the patient was seen and examined on 07/29/2024 PCP: [name], NP  Patient coming from: SNF  Chief Complaint: No chief complaint on file.  HPI: [name] is a 88 year old female with a history of of C4 central cord syndrome with quadriplegia, diabetes mellitus type 2, hypertension, hyperlipidemia, CKD stage IV, MDD, [place], cognitive impairment, constipation, and neurogenic bladder with chronic Foley catheter presenting from [facility] nursing center secondary to low hemoglobin on blood work with Hgb 6.9.  The patient is a poor historian secondary to her cognitive impairment.  History is supplemented by the patient's daughter and review of medical record.  Patient's daughter states that the patient has been a little bit more somnolent for the past month.  At the same time, she states that the patient continues to have is currently decline over the last 6 to 12 months.  Daughter is not aware of any hematochezia or melena.  The patient has not had any fevers, chills, chest pain, shortness breath, hemoptysis, abd pain, vomiting, diarrhea.  P.o. intake has been poor.  At baseline, the patient is essentially bedbound.  In the ED, the patient was afebrile and hemodynamically stable with oxygen saturation 98% on room air.  WBC 8.4, hemoglobin 6.7, platelets 323.  Sodium 138, potassium 4 point, bicarbonate 25, serum creatinine 2.73.  AST 30, ALT 18, capacity 0, total bilirubin <0.2.  EKG shows sinus rhythm nonspecific T wave changes. Hemoccult was positive.  GI was consulted to assist with management.   Review of Systems: As mentioned in the history of present illness. All other systems reviewed and are negative. Past Medical History:  Diagnosis Date   Acute cystitis without hematuria    Adult failure to thrive    Anemia    Anxiety    Atherosclerosis of aorta    Central cord syndrome at  C4 level of cervical spinal cord, subsequent encounter (HCC)    Chronic kidney disease, stage IV (severe) (HCC)    CKD (chronic kidney disease)    stage 3   Depression    DM type 2 with diabetic peripheral neuropathy (HCC)    Dysphagia    GERD (gastroesophageal reflux disease)    Gout    High cholesterol    HTN (hypertension)    Hyponatremia    MDD (major depressive disorder)    Neck pain    Neuropathy    Quadriplegia, C1-C4 incomplete (HCC)    Radiculopathy    RLS (restless legs syndrome)    Unspecified convulsions (HCC)    Urinary retention    Past Surgical History:  Procedure Laterality Date   ABDOMINAL HYSTERECTOMY     ANTERIOR CERVICAL DECOMP/DISCECTOMY FUSION N/A 02/05/2021   Procedure: Cervical Three-Four  Anterior cervical decompression/discectomy/fusion;  Surgeon: Gillie Duncans, MD;  Location: [facility] OR;  Service: Neurosurgery;  Laterality: N/A;  RM 20   APPENDECTOMY     BACK SURGERY     BALLOON DILATION N/A 11/19/2022   Procedure: BALLOON DILATION;  Surgeon: Cindie Carlin POUR, DO;  Location: [facility];  Service: Endoscopy;  Laterality: N/A;   BIOPSY  09/22/2021   Procedure: BIOPSY;  Surgeon: Cindie Carlin POUR, DO;  Location: [facility];  Service: Endoscopy;;   BIOPSY  11/19/2022   Procedure: BIOPSY;  Surgeon: Cindie Carlin POUR, DO;  Location: [facility];  Service: Endoscopy;;   CERVICAL DISC SURGERY     ESOPHAGOGASTRODUODENOSCOPY (EGD) WITH  PROPOFOL  N/A 09/22/2021   Procedure: ESOPHAGOGASTRODUODENOSCOPY (EGD) WITH PROPOFOL ;  Surgeon: Cindie Carlin POUR, DO;  Location: [facility];  Service: Endoscopy;  Laterality: N/A;  1:00pm   ESOPHAGOGASTRODUODENOSCOPY (EGD) WITH PROPOFOL  N/A 11/19/2022   Procedure: ESOPHAGOGASTRODUODENOSCOPY (EGD) WITH PROPOFOL ;  Surgeon: Cindie Carlin POUR, DO;  Location: [facility];  Service: Endoscopy;  Laterality: N/A;  11:30 am, asa 3   FLEXIBLE SIGMOIDOSCOPY  09/20/2023   Procedure: FLEXIBLE SIGMOIDOSCOPY;  Surgeon: Cindie Carlin POUR, DO;   Location: [facility];  Service: Endoscopy;;   HAND SURGERY     KNEE SURGERY     Social History:  reports that she has never smoked. She has never used smokeless tobacco. She reports that she does not drink alcohol and does not use drugs.  Allergies  Allergen Reactions   Ace Inhibitors Other (See Comments)    Hyperkalemia    Codeine Other (See Comments)    Unknown    Sulfa Antibiotics Other (See Comments)    Unknown     Family History  Problem Relation Age of Onset   Cancer Mother    Cardiomyopathy Father    Seizures Sister        childhood   Seizures Brother        in his 55s   Colon cancer Neg Hx     Prior to Admission medications   Medication Sig Start Date End Date Taking? Authorizing Provider  acetaminophen  (TYLENOL ) 325 MG tablet Take 650 mg by mouth 3 (three) times daily.    [provider]  aluminum-magnesium  hydroxide 200-200 MG/5ML suspension Take 15 mLs by mouth every 6 (six) hours as needed (Indigestion and heartburn).    [provider]  AMBULATORY NON FORMULARY MEDICATION Medication Name:  Continue monthly catheter changes with 48fr catheter at skilled nursing facility. 08/12/21   McKenzie, Belvie CROME, MD  amLODipine (NORVASC) 10 MG tablet Take 10 mg by mouth daily.    [provider]  ascorbic acid (VITAMIN C) 500 MG tablet Take 500 mg by mouth daily.    [provider]  bisacodyl  (DULCOLAX) 10 MG suppository Place 10 mg rectally every Thursday.    [provider]  camphor-menthol VIKKI) lotion Apply 1 Application topically 2 (two) times daily as needed for itching.    [provider]  Continuous Glucose Receiver (FREESTYLE LIBRE 2 READER) DEVI by Does not apply route. Friday    [provider]  Continuous Glucose Sensor (FREESTYLE LIBRE 2 SENSOR) MISC  02/22/24   [provider]  dapagliflozin  propanediol (FARXIGA ) 5 MG TABS tablet Take 5 mg by mouth daily.    [provider]   dronabinol  (MARINOL ) 2.5 MG capsule Take 1 capsule (2.5 mg total) by mouth daily before lunch for 27 days. 07/07/24 08/03/24  [name], NP  DULoxetine  (CYMBALTA ) 20 MG capsule Take 20 mg by mouth daily.    [provider]  famotidine (PEPCID) 20 MG tablet Take 20 mg by mouth in the morning.    [provider]  ferrous sulfate  325 (65 FE) MG EC tablet Take 325 mg by mouth 3 (three) times a week. Monday, Wednesday, and Friday.    [provider]  fluconazole (DIFLUCAN) 200 MG tablet Take 200 mg by mouth daily. Yeast in urine    [provider]  folic acid  (FOLVITE ) 1 MG tablet Take 1 mg by mouth daily.    [provider]  gabapentin  (NEURONTIN ) 300 MG capsule Take 300 mg by mouth  2 (two) times daily.    [provider]  insulin  aspart (NOVOLOG  FLEXPEN) 100 UNIT/ML FlexPen Inject 5 Units into the skin 3 (three) times daily with meals. 8 am, 12 pm, and 6 pm  Hold if pt eats <50% of meals    [provider]  insulin  glargine (LANTUS  SOLOSTAR) 100 UNIT/ML Solostar Pen Inject 20 Units into the skin at bedtime.    [provider]  Insulin  Pen Needle 30G X 5 MM MISC 1 Device by Does not apply route daily. 3/16    [name], NP  ipratropium-albuterol  (DUONEB) 0.5-2.5 (3) MG/3ML SOLN Take 3 mLs by nebulization every 6 (six) hours as needed (wheezing).    [provider]  lamoTRIgine  (LAMICTAL ) 150 MG tablet Take 150 mg by mouth 2 (two) times daily.    [provider]  levETIRAcetam  (KEPPRA ) 500 MG tablet Take 500 mg by mouth 2 (two) times daily. Renal dose    [provider]  linaclotide  (LINZESS ) 72 MCG capsule Take 72 mcg by mouth daily.    [provider]  loratadine (CLARITIN) 10 MG tablet Take 10 mg by mouth daily as needed for allergies.    [provider]  Magnesium  Hydroxide (MILK OF MAGNESIA PO) Take 30 mLs by mouth as needed (indigestion).    [provider]   melatonin 5 MG TABS Take 5 mg by mouth at bedtime.    [provider]  midazolam  (VERSED ) 10 MG/2ML SOLN injection Inject 0.4 mLs (2 mg total) into the muscle every 15 (fifteen) minutes as needed for agitation. Patient taking differently: Inject 2 mg into the muscle every 15 (fifteen) minutes as needed for agitation. Special Instructions: Administer 10mg  (one 2ml vial) IM every 15 minutes  up to (4) total doses for seizures lasting >1 minute - 04/24/24   [name], NP  ondansetron  (ZOFRAN ) 4 MG/5ML solution Take 4 mg by mouth every 8 (eight) hours as needed for nausea or vomiting.    [provider]  Phenylephrine -Cocoa Butter 0.25-85.5 % SUPP Place 1 suppository rectally 2 (two) times daily as needed (hemorrhoids).    [provider]  rosuvastatin  (CRESTOR ) 20 MG tablet Take 20 mg by mouth at bedtime.    [provider]  SANTYL 250 UNIT/GM ointment Apply 1 Application topically daily.  Apply to sacral wound daily per tx orders. 06/27/24   [provider]  sennosides-docusate sodium  (SENOKOT-S) 8.6-50 MG tablet Take 2 tablets by mouth at bedtime.    [provider]  sodium bicarbonate  650 MG tablet Take 1 tablet (650 mg total) by mouth 2 (two) times daily. 02/24/23   Adhikari, Amrit, MD  solifenacin (VESICARE) 5 MG tablet Take 5 mg by mouth at bedtime.    [provider]  zinc oxide 20 % ointment Apply 1 Application topically as directed. Every shift as needed    [provider]    Physical Exam: Vitals:   07/29/24 0945 07/29/24 0953 07/29/24 1213  BP:  (!) 111/47 (!) 113/49  Pulse:  78 70  Resp:  16 20  Temp:  97.9 F (36.6 C) (!) 97.4 F (36.3 C)  TempSrc:  Oral Oral  SpO2:  98% 98%  Weight: 69.4 kg    Height: 5' 2 (1.575 m)     GENERAL:  A&O x 3, NAD, well developed, cooperative, follows commands HEENT: [place]/AT, No thrush, No icterus, No oral ulcers Neck:  No neck mass, No meningismus, soft, supple CV:  RRR, no  S3, no S4, no rub, no JVD Lungs:  CTA, no wheeze, no rhonchi, good air movement Abd: soft/NT +BS, nondistended Ext: No edema, no lymphangitis, no cyanosis, no rashes Neuro:  CN II-XII intact, strength 4/5 in RUE, RLE, strength 4/5 LUE, LLE; sensation intact bilateral; no dysmetria; babinski equivocal      Data Reviewed: Data reviewed above in history  Assessment and Plan: Acute Blood Loss Anemia/Heme Positive Stool -GI consulted -transfuse one unit PRBC - Patient had hemoglobin of 7.4 on 07/11/2024 and hemoglobin 8.5 on 06/19/2024 - Start pantoprazole  twice daily  Hematuria - Certainly this could be contributing to the patient's drop in hemoglobin - Review of the medical record shows that the patient has had intermittent hematuria since 02/11/2022 - Send for urine culture - Patient has chronic indwelling Foley catheter - Foley catheter was exchanged on 07/28/2024--it appears to be draining properly - Plan for urology consult if hematuria remains persistent and or Foley catheter is not draining properly - At the minimum, patient will need outpatient urology follow-up - Holding Farxiga   Acute on chronic renal failure--CKD stage IV - Baseline creatinine 1.8-2.1 - Presented with serum creatinine 2.72 - Secondary to hemodynamic changes and volume depletion - PRBCs ordered - Start IV fluids  Central cord syndrome at C4 level  C-spine cord compression/quadriplegia C1-4  -holding aspirin  -occurred 01/25/21  Hyperkalemia -give lokelma  -IV bicarb, IV calcium  gluconate -start IV NS -place on tele - repeat BMP  Unstageable sacral decubitus ulcer - Wound care consult - present prior to admission - photograph taken  Diabetes mellitus type 2 with hyperglycemia - Holding Lantus  temporarily - Sliding scale - Holding Farxiga  - 06/19/2024 hemoglobin A1c 6.7  Essential hypertension - Holding amlodipine secondary to soft blood pressure  Seizure disorder - Continue  Keppra  500 mg twice daily and Lamictal  150 mg twice daily  Hyperlipidemia - Continue statin  Chronic constipation - Continue MiraLAX  and senna     Neurogenic bladder - Patient has chronic indwelling Foley catheter PTA - Foley catheter was changed on 07/20/2024  Major neurocognitive disorder - Risk for hospital delirium - Holding Requip  for now as this can cause worsening mental status and delirium  Macrocytic anemia - 06/20/2024 B12 1090 - 06/20/2024 folic acid  >20  MDD - Continue duloxetine     Advance Care Planning: DNR  Consults: GI  Family Communication: daughter 11/29  Severity of Illness: The appropriate patient status for this patient is OBSERVATION. Observation status is judged to be reasonable and necessary in order to provide the required intensity of service to ensure the patient's safety. The patient's presenting symptoms, physical exam findings, and initial radiographic and laboratory data in the context of their medical condition is felt to place them at decreased risk for further clinical deterioration. Furthermore, it is anticipated that the patient will be medically stable for discharge from the hospital within 2 midnights of admission.   Author: Alm Schneider, MD 07/29/2024 1:09 PM  For on call review [email or web address].

## 2024-07-29 NOTE — Progress Notes (Signed)
 Responded to nursing call:  decreased responsiveness I came to bedside to eval patient Sister at bedside Patient is awake and alert.  She is conversant and identifies her sister by name.  She is following commands and speaks fluently   Subjective: Denies cp, abd pain, sob  Vitals:   07/29/24 1303 07/29/24 1321 07/29/24 1414 07/29/24 1548  BP: (!) 104/51 (!) 112/45 (!) 109/46 129/66  Pulse: 70 68 65   Resp:  18 20 16   Temp: 97.6 F (36.4 C) (!) 97.5 F (36.4 C) (!) 97.5 F (36.4 C) 97.8 F (36.6 C)  TempSrc: Axillary  Oral Oral  SpO2: 95%  100%   Weight:      Height:       CV--RRR Lung--CTA Abd--soft+BS/NT   Assessment/Plan: ??Altered mental status -pt is more alert and awake than when I evaluated patient in ED - no focal deficits on exam -ABG 7.44/38/85/25 on RA -B12 -TSH -ammonia -CT brain -sister updated at bedside     Alm Schneider, DO [facility]

## 2024-07-29 NOTE — ED Triage Notes (Signed)
 Pt to ed via [facility] from [facility]. C/o of low hbg. Last 6.9.   Ems 112/63 97%

## 2024-07-30 DIAGNOSIS — R310 Gross hematuria: Secondary | ICD-10-CM | POA: Diagnosis not present

## 2024-07-30 DIAGNOSIS — N179 Acute kidney failure, unspecified: Secondary | ICD-10-CM

## 2024-07-30 DIAGNOSIS — N184 Chronic kidney disease, stage 4 (severe): Secondary | ICD-10-CM | POA: Diagnosis not present

## 2024-07-30 DIAGNOSIS — K8020 Calculus of gallbladder without cholecystitis without obstruction: Secondary | ICD-10-CM | POA: Diagnosis not present

## 2024-07-30 DIAGNOSIS — N3091 Cystitis, unspecified with hematuria: Secondary | ICD-10-CM | POA: Diagnosis not present

## 2024-07-30 DIAGNOSIS — G8252 Quadriplegia, C1-C4 incomplete: Secondary | ICD-10-CM | POA: Diagnosis not present

## 2024-07-30 DIAGNOSIS — N3289 Other specified disorders of bladder: Secondary | ICD-10-CM | POA: Diagnosis not present

## 2024-07-30 DIAGNOSIS — D649 Anemia, unspecified: Secondary | ICD-10-CM | POA: Diagnosis not present

## 2024-07-30 DIAGNOSIS — N3001 Acute cystitis with hematuria: Secondary | ICD-10-CM | POA: Diagnosis not present

## 2024-07-30 DIAGNOSIS — K922 Gastrointestinal hemorrhage, unspecified: Secondary | ICD-10-CM | POA: Diagnosis not present

## 2024-07-30 DIAGNOSIS — Z515 Encounter for palliative care: Secondary | ICD-10-CM | POA: Diagnosis not present

## 2024-07-30 DIAGNOSIS — K5641 Fecal impaction: Secondary | ICD-10-CM | POA: Diagnosis not present

## 2024-07-30 LAB — BASIC METABOLIC PANEL WITH GFR
Anion gap: 15 (ref 5–15)
BUN: 88 mg/dL — ABNORMAL HIGH (ref 8–23)
CO2: 21 mmol/L — ABNORMAL LOW (ref 22–32)
Calcium: 8.2 mg/dL — ABNORMAL LOW (ref 8.9–10.3)
Chloride: 104 mmol/L (ref 98–111)
Creatinine, Ser: 2.37 mg/dL — ABNORMAL HIGH (ref 0.44–1.00)
GFR, Estimated: 20 mL/min — ABNORMAL LOW (ref >60)
Glucose, Bld: 112 mg/dL — ABNORMAL HIGH (ref 70–99)
Potassium: 4.4 mmol/L (ref 3.5–5.1)
Sodium: 140 mmol/L (ref 135–145)

## 2024-07-30 LAB — CBC
HCT: 25.4 % — ABNORMAL LOW (ref 36.0–46.0)
Hemoglobin: 8.2 g/dL — ABNORMAL LOW (ref 12.0–15.0)
MCH: 32 pg (ref 26.0–34.0)
MCHC: 32.3 g/dL (ref 30.0–36.0)
MCV: 99.2 fL (ref 80.0–100.0)
Platelets: 258 K/uL (ref 150–400)
RBC: 2.56 MIL/uL — ABNORMAL LOW (ref 3.87–5.11)
RDW: 15.1 % (ref 11.5–15.5)
WBC: 6.8 K/uL (ref 4.0–10.5)
nRBC: 0 % (ref 0.0–0.2)

## 2024-07-30 LAB — MRSA NEXT GEN BY PCR, NASAL: MRSA by PCR Next Gen: NOT DETECTED

## 2024-07-30 LAB — GLUCOSE, CAPILLARY
Glucose-Capillary: 108 mg/dL — ABNORMAL HIGH (ref 70–99)
Glucose-Capillary: 103 mg/dL — ABNORMAL HIGH (ref 70–99)
Glucose-Capillary: 104 mg/dL — ABNORMAL HIGH (ref 70–99)
Glucose-Capillary: 170 mg/dL — ABNORMAL HIGH (ref 70–99)
Glucose-Capillary: 149 mg/dL — ABNORMAL HIGH (ref 70–99)

## 2024-07-30 LAB — MAGNESIUM: Magnesium: 3.5 mg/dL — ABNORMAL HIGH (ref 1.7–2.4)

## 2024-07-30 MED ORDER — CHLORHEXIDINE GLUCONATE CLOTH 2 % EX PADS
6.0000 | MEDICATED_PAD | Freq: Every day | CUTANEOUS | Status: DC
  Administered 2024-07-30 – 2024-08-02 (×4): 6 via TOPICAL

## 2024-07-30 MED ORDER — LAMOTRIGINE 150 MG PO TABS
150.0000 mg | ORAL_TABLET | Freq: Two times a day (BID) | ORAL | Status: DC
  Administered 2024-07-30 – 2024-08-02 (×7): 150 mg via ORAL
  Filled 2024-07-30 (×7): qty 1

## 2024-07-30 MED ORDER — BOOST / RESOURCE BREEZE PO LIQD CUSTOM
1.0000 | Freq: Three times a day (TID) | ORAL | Status: DC
  Administered 2024-07-31 – 2024-08-01 (×2): 1 via ORAL

## 2024-07-30 NOTE — Progress Notes (Addendum)
 PROGRESS NOTE  [name] FMW:[id number] DOB: [birth year] DOA: 07/29/2024 PCP: [name], NP  Brief History:  88 year old female with a history of of C4 central cord syndrome with quadriplegia, diabetes mellitus type 2, hypertension, hyperlipidemia, CKD stage IV, MDD, [place], cognitive impairment, constipation, and neurogenic bladder with chronic Foley catheter presenting from [facility] nursing center secondary to low hemoglobin on blood work with Hgb 6.9.  The patient is a poor historian secondary to her cognitive impairment.  History is supplemented by the patient's daughter and review of medical record.  Patient's daughter states that the patient has been a little bit more somnolent for the past month.  At the same time, she states that the patient continues to have is currently decline over the last 6 to 12 months.  Daughter is not aware of any hematochezia or melena.  The patient has not had any fevers, chills, chest pain, shortness breath, hemoptysis, abd pain, vomiting, diarrhea.  P.o. intake has been poor.  At baseline, the patient is essentially bedbound.  In the ED, the patient was afebrile and hemodynamically stable with oxygen saturation 98% on room air.  WBC 8.4, hemoglobin 6.7, platelets 323.  Sodium 138, potassium 4 point, bicarbonate 25, serum creatinine 2.73.  AST 30, ALT 18, capacity 0, total bilirubin <0.2.  EKG shows sinus rhythm nonspecific T wave changes. Hemoccult was positive.  GI was consulted to assist with management.    Assessment/Plan: Acute Blood Loss Anemia/Heme Positive Stool -GI consulted -transfused one unit PRBC 11/29 - Patient had hemoglobin of 7.4 on 07/11/2024 and hemoglobin 8.5 on 06/19/2024 - Continue pantoprazole  twice daily   Hematuria - Certainly this could be contributing to the patient's drop in hemoglobin - Review of the medical record shows that the patient has had intermittent hematuria since 02/11/2022 - Send for urine  culture - Patient has chronic indwelling Foley catheter - Foley catheter was exchanged on 07/28/2024--it appears to be draining properly - Plan for urology consult if hematuria remains persistent and or Foley catheter is not draining properly - At the minimum, patient will need outpatient urology follow-up - Holding Farxiga   CAUTI -UA>50 WBC -11/28 and 11/29 urine culture = E coli & Providencia - hold Farxiga    Acute on chronic renal failure--CKD stage IV - Baseline creatinine 1.8-2.1 - Presented with serum creatinine 2.72 - Secondary to hemodynamic changes and volume depletion - one unit PRBC given - Continue IV fluids  Acute Metabolic Encephalopathy -due to UTI and AKI -11/30--pt awake and alert.  Follows commands.  Speaks fluently, although pleasantly confused, knows she's in hospital -11/30 sister at beside states she is near baseline   Central cord syndrome at C4 level  C-spine cord compression/quadriplegia C1-4  -holding aspirin  -occurred 01/25/21   Hyperkalemia -given lokelma  -IV bicarb, IV calcium  gluconate given -started IV NS -place on tele - improved   Unstageable sacral decubitus ulcer - Wound care consult appreciated - present prior to admission - photographs taken   Diabetes mellitus type 2 with hyperglycemia - Holding Lantus  temporarily - Sliding scale - Holding Farxiga  - 06/19/2024 hemoglobin A1c 6.7   Essential hypertension - Holding amlodipine secondary to soft blood pressure   Seizure disorder - Continue Keppra  500 mg twice daily and Lamictal  150 mg twice daily   Hyperlipidemia - Continue statin   Chronic constipation - Continue MiraLAX  and senna     Neurogenic bladder - Patient has chronic indwelling Foley catheter PTA - Foley  catheter was changed on 07/20/2024   Major neurocognitive disorder - Risk for hospital delirium - Holding Requip  for now as this can cause worsening mental status and delirium   Macrocytic anemia -  06/20/2024 B12 1090 - 06/20/2024 folic acid  >20   MDD - Continue duloxetine        Family Communication:   sister at bedside 11/30  Consultants:  GI  Code Status:  DNR  DVT Prophylaxis:  SCDs   Procedures: As Listed in Progress Note Above  Antibiotics: Ceftriaxone  11/29>>     Subjective: Patient denies fevers, chills, headache, chest pain, dyspnea, nausea, vomiting, diarrhea, abdominal pain, dysuria   Objective: Vitals:   07/30/24 0700 07/30/24 0746 07/30/24 0800 07/30/24 1115  BP: (!) 109/25  (!) 114/59   Pulse: 76  75   Resp: 19  17   Temp:  97.7 F (36.5 C)  (!) 97.4 F (36.3 C)  TempSrc:  Axillary  Axillary  SpO2: 98%  100%   Weight:      Height:        Intake/Output Summary (Last 24 hours) at 07/30/2024 1348 Last data filed at 07/30/2024 0800 Gross per 24 hour  Intake 1484.31 ml  Output 675 ml  Net 809.31 ml   Weight change:  Exam:  General:  Pt is alert, follows commands appropriately, not in acute distress HEENT: No icterus, No thrush, No neck mass, [place]/AT Cardiovascular: RRR, S1/S2, no rubs, no gallops Respiratory: CTA bilaterally, no wheezing, no crackles, no rhonchi Abdomen: Soft/+BS, non tender, non distended, no guarding Extremities: No edema, No lymphangitis, No petechiae, No rashes, no synovitis   Data Reviewed: I have personally reviewed following labs and imaging studies Basic Metabolic Panel: Recent Labs  Lab 07/29/24 0950 07/29/24 1652 07/30/24 0333  NA 138  --  140  K 5.8*  --  4.4  CL 101  --  104  CO2 25  --  21*  GLUCOSE 175*  --  112*  BUN 93*  --  88*  CREATININE 2.72*  --  2.37*  CALCIUM  8.7*  --  8.2*  MG  --  3.8* 3.5*   Liver Function Tests: Recent Labs  Lab 07/29/24 0950  AST 30  ALT 18  ALKPHOS 81  BILITOT <0.2  PROT 6.5  ALBUMIN  3.2*   No results for input(s): LIPASE, AMYLASE in the last 168 hours. Recent Labs  Lab 07/29/24 1652  AMMONIA 27   Coagulation Profile: Recent Labs  Lab  07/29/24 1652  INR 1.0   CBC: Recent Labs  Lab 07/29/24 0835 07/29/24 0950 07/29/24 2100 07/30/24 0333  WBC 8.7 8.4  --  6.8  NEUTROABS 5.9  --   --   --   HGB 6.9* 6.7* 8.7* 8.2*  HCT 21.8* 21.4* 27.0* 25.4*  MCV 100.9* 100.9*  --  99.2  PLT 317 323  --  258   Cardiac Enzymes: No results for input(s): CKTOTAL, CKMB, CKMBINDEX, TROPONINI in the last 168 hours. BNP: Invalid input(s): POCBNP CBG: Recent Labs  Lab 07/29/24 1141 07/29/24 1414 07/29/24 1614 07/29/24 2059  GLUCAP 189* 148* 142* 136*   HbA1C: No results for input(s): HGBA1C in the last 72 hours. Urine analysis:    Component Value Date/Time   COLORURINE RED (A) 07/29/2024 1026   APPEARANCEUR TURBID (A) 07/29/2024 1026   LABSPEC  07/29/2024 1026    TEST NOT REPORTED DUE TO COLOR INTERFERENCE OF URINE PIGMENT   PHURINE  07/29/2024 1026    TEST NOT REPORTED DUE  TO COLOR INTERFERENCE OF URINE PIGMENT   GLUCOSEU (A) 07/29/2024 1026    TEST NOT REPORTED DUE TO COLOR INTERFERENCE OF URINE PIGMENT   HGBUR (A) 07/29/2024 1026    TEST NOT REPORTED DUE TO COLOR INTERFERENCE OF URINE PIGMENT   BILIRUBINUR (A) 07/29/2024 1026    TEST NOT REPORTED DUE TO COLOR INTERFERENCE OF URINE PIGMENT   KETONESUR (A) 07/29/2024 1026    TEST NOT REPORTED DUE TO COLOR INTERFERENCE OF URINE PIGMENT   PROTEINUR (A) 07/29/2024 1026    TEST NOT REPORTED DUE TO COLOR INTERFERENCE OF URINE PIGMENT   NITRITE (A) 07/29/2024 1026    TEST NOT REPORTED DUE TO COLOR INTERFERENCE OF URINE PIGMENT   LEUKOCYTESUR (A) 07/29/2024 1026    TEST NOT REPORTED DUE TO COLOR INTERFERENCE OF URINE PIGMENT   Sepsis Labs: @LABRCNTIP (procalcitonin:4,lacticidven:4) ) Recent Results (from the past 240 hours)  Culture, Urine (Do not remove urinary catheter, catheter placed by urology or difficult to place)     Status: Abnormal (Preliminary result)   Collection Time: 07/28/24  2:21 PM   Specimen: Urine, Catheterized  Result Value Ref Range  Status   Specimen Description   Final    URINE, CATHETERIZED Performed at [facility], [address], [place], [place] [address]    Special Requests   Final    NONE Performed at [facility], [address], [place], [place] [address]    Culture (A)  Final    >=100,000 COLONIES/mL ESCHERICHIA COLI >=100,000 COLONIES/mL PROVIDENCIA STUARTII SUSCEPTIBILITIES TO FOLLOW Performed at [facility], [address], [place], [place] [address]    Report Status PENDING  Incomplete   Organism ID, Bacteria ESCHERICHIA COLI (A)  Final      Susceptibility   Escherichia coli - MIC*    AMPICILLIN 8 SENSITIVE Sensitive     CEFAZOLIN  (URINE) Value in next row Sensitive      2 SENSITIVEThis is a modified FDA-approved test that has been validated and its performance characteristics determined by the reporting laboratory.  This laboratory is certified under the Clinical Laboratory Improvement Amendments CLIA as qualified to perform high complexity clinical laboratory testing.    CEFEPIME Value in next row Sensitive      2 SENSITIVEThis is a modified FDA-approved test that has been validated and its performance characteristics determined by the reporting laboratory.  This laboratory is certified under the Clinical Laboratory Improvement Amendments CLIA as qualified to perform high complexity clinical laboratory testing.    ERTAPENEM Value in next row Sensitive      2 SENSITIVEThis is a modified FDA-approved test that has been validated and its performance characteristics determined by the reporting laboratory.  This laboratory is certified under the Clinical Laboratory Improvement Amendments CLIA as qualified to perform high complexity clinical laboratory testing.    CEFTRIAXONE  Value in next row Sensitive      2 SENSITIVEThis is a modified FDA-approved test that has been validated and its performance characteristics determined by the reporting laboratory.  This laboratory is certified under the Clinical  Laboratory Improvement Amendments CLIA as qualified to perform high complexity clinical laboratory testing.    CIPROFLOXACIN Value in next row Intermediate      2 SENSITIVEThis is a modified FDA-approved test that has been validated and its performance characteristics determined by the reporting laboratory.  This laboratory is certified under the Clinical Laboratory Improvement Amendments CLIA as qualified to perform high complexity clinical laboratory testing.    GENTAMICIN Value in next row Sensitive  2 SENSITIVEThis is a modified FDA-approved test that has been validated and its performance characteristics determined by the reporting laboratory.  This laboratory is certified under the Clinical Laboratory Improvement Amendments CLIA as qualified to perform high complexity clinical laboratory testing.    NITROFURANTOIN Value in next row Sensitive      2 SENSITIVEThis is a modified FDA-approved test that has been validated and its performance characteristics determined by the reporting laboratory.  This laboratory is certified under the Clinical Laboratory Improvement Amendments CLIA as qualified to perform high complexity clinical laboratory testing.    TRIMETH/SULFA Value in next row Sensitive      2 SENSITIVEThis is a modified FDA-approved test that has been validated and its performance characteristics determined by the reporting laboratory.  This laboratory is certified under the Clinical Laboratory Improvement Amendments CLIA as qualified to perform high complexity clinical laboratory testing.    AMPICILLIN/SULBACTAM Value in next row Sensitive      2 SENSITIVEThis is a modified FDA-approved test that has been validated and its performance characteristics determined by the reporting laboratory.  This laboratory is certified under the Clinical Laboratory Improvement Amendments CLIA as qualified to perform high complexity clinical laboratory testing.    PIP/TAZO Value in next row Sensitive       <=4 SENSITIVEThis is a modified FDA-approved test that has been validated and its performance characteristics determined by the reporting laboratory.  This laboratory is certified under the Clinical Laboratory Improvement Amendments CLIA as qualified to perform high complexity clinical laboratory testing.    MEROPENEM Value in next row Sensitive      <=4 SENSITIVEThis is a modified FDA-approved test that has been validated and its performance characteristics determined by the reporting laboratory.  This laboratory is certified under the Clinical Laboratory Improvement Amendments CLIA as qualified to perform high complexity clinical laboratory testing.    * >=100,000 COLONIES/mL ESCHERICHIA COLI  Culture, Urine (Do not remove urinary catheter, catheter placed by urology or difficult to place)     Status: Abnormal (Preliminary result)   Collection Time: 07/29/24 10:26 AM   Specimen: Urine, Catheterized  Result Value Ref Range Status   Specimen Description   Final    URINE, CATHETERIZED Performed at [facility], [address] [place], [place] [address]    Special Requests   Final    NONE Performed at [facility], [address], [place], [place] [address]    Culture (A)  Final    >=100,000 COLONIES/mL ESCHERICHIA COLI >=100,000 COLONIES/mL PROVIDENCIA STUARTII SUSCEPTIBILITIES PERFORMED ON PREVIOUS CULTURE WITHIN THE LAST 5 DAYS. Performed at [facility], [address], [place], [place] [address]    Report Status PENDING  Incomplete  MRSA Next Gen by PCR, Nasal     Status: None   Collection Time: 07/29/24  3:00 PM   Specimen: Nasal Mucosa; Nasal Swab  Result Value Ref Range Status   MRSA by PCR Next Gen NOT DETECTED NOT DETECTED Final    Comment: (NOTE) The GeneXpert MRSA Assay (FDA approved for NASAL specimens only), is one component of a comprehensive MRSA colonization surveillance program. It is not intended to diagnose MRSA infection nor to guide or monitor treatment for  MRSA infections. Test performance is not FDA approved in patients less than 4 years old. Performed at [facility], [address] [place], [place] [address]      Scheduled Meds:  acetaminophen   650 mg Oral TID   ascorbic acid   500 mg Oral Daily  Chlorhexidine  Gluconate Cloth  6 each Topical Daily   collagenase    Topical Daily   DULoxetine   20 mg Oral Daily   fesoterodine   4 mg Oral Daily   folic acid   1 mg Oral Daily   gabapentin   300 mg Oral BID   insulin  aspart  0-5 Units Subcutaneous QHS   insulin  aspart  0-9 Units Subcutaneous TID WC   lamoTRIgine   150 mg Oral BID   levETIRAcetam   500 mg Intravenous Q24H   linaclotide   145 mcg Oral Daily   pantoprazole  (PROTONIX ) IV  40 mg Intravenous Q12H   rosuvastatin   20 mg Oral QHS   Continuous Infusions:  sodium chloride  75 mL/hr at 07/30/24 9471   cefTRIAXone  (ROCEPHIN )  IV 1 g (07/29/24 1556)    Procedures/Studies: CT HEAD WO CONTRAST ( ) Result Date: 07/29/2024 EXAM: CT HEAD WITHOUT CONTRAST 07/29/2024 02:48:59 PM TECHNIQUE: CT of the head was performed without the administration of intravenous contrast. Automated exposure control, iterative reconstruction, and/or weight based adjustment of the mA/kV was utilized to reduce the radiation dose to as low as reasonably achievable. COMPARISON: Head CT 03/23/2023. Brain MRI 07/07/2021. CLINICAL HISTORY: 88 year old female with altered mental status. FINDINGS: BRAIN AND VENTRICLES: Brain volume remains normal for age. No acute hemorrhage. No evidence of acute infarct. No hydrocephalus. No extra-axial collection. No mass effect or midline shift. Gray white differentiation is stable and largely normal for age. Minimal to mild chronic white matter changes. Calcified atherosclerosis at the skull base. No suspicious intracranial vascular hyperdensity. ORBITS: No acute abnormality. 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 Chronic small volume fluid or mucosal thickening in the  left sphenoid sinus is stable since last year. SOFT TISSUES AND SKULL: No acute soft tissue abnormality. No skull fracture. Tympanic cavities remain well aerated. Improved left mastoid aeration since last year, mild mastoid effusions now, significance doubtful. IMPRESSION: 1. No acute intracranial abnormality. Negative for age non-contrast CT appearance of the brain. Electronically signed by: Helayne Hurst MD 07/29/2024 05:21 PM EST RP Workstation: [id number]    Alm Schneider, DO  [facility]  If 7PM-7AM, please contact night-coverage [email or web address] Password [email or web address] 07/30/2024, 1:48 PM   LOS: 0 days

## 2024-07-30 NOTE — TOC Initial Note (Signed)
 Transition of Care [facility]) - Initial/Assessment Note    Patient Details  Name: [name] MRN: [id number] Date of Birth: 09-24-1935  Transition of Care [facility]) CM/SW Contact:    Sharlyne Stabs, RN Phone Number: 07/30/2024, 11:47 AM  Clinical Narrative:     Patient admitted with symptomatic anemia. From [facility] nursing center LTC. GI consulted. Planning to return to [facility] possibly Tuesday. IPCM following.               Expected Discharge Plan: Long Term Nursing Home Barriers to Discharge: Continued Medical Work up   Patient Goals and CMS Choice Patient states their goals for this hospitalization and ongoing recovery are:: [facility]   Expected Discharge Plan and Services    Return to [facility]     Prior Living Arrangements/Services   Lives with:: Facility Resident             Current home services: DME    Activities of Daily Living   ADL Screening (condition at time of admission) Independently performs ADLs?: No Does the patient have a NEW difficulty with bathing/dressing/toileting/self-feeding that is expected to last >3 days?: No Does the patient have a NEW difficulty with getting in/out of bed, walking, or climbing stairs that is expected to last >3 days?: No Does the patient have a NEW difficulty with communication that is expected to last >3 days?: No Is the patient deaf or have difficulty hearing?: No Does the patient have difficulty seeing, even when wearing glasses/contacts?: No Does the patient have difficulty concentrating, remembering, or making decisions?: Yes  Permission Sought/Granted                  Emotional Assessment              Admission diagnosis:  Upper GI bleed [K92.2] Symptomatic anemia [D64.9] Patient Active Problem List   Diagnosis Date Noted   Symptomatic anemia 07/29/2024   CKD (chronic kidney disease) stage 4, GFR 15-29 ml/min (HCC) 07/29/2024   Hypercalcemia due to chronic kidney disease 06/20/2024   Macrocytic anemia  06/20/2024   Folate deficiency 02/29/2024   Foley catheter in place 03/23/2023   Neurogenic bladder 03/01/2023   Anemia associated with type 2 diabetes mellitus (HCC) 03/01/2023   CKD stage 4 due to type 2 diabetes mellitus (HCC) 03/01/2023   Persistent gross hematuria 02/19/2023   UTI (urinary tract infection) 02/19/2023   High risk medications (not anticoagulants) long-term use 01/27/2023   Neurocognitive deficits 10/26/2022   Abdominal pain 10/01/2022   Hypertension associated with stage 4 chronic kidney disease due to type 2 diabetes mellitus (HCC) 08/21/2022   Hypothyroidism (acquired) 04/30/2022   Post-menopausal osteoporosis 04/23/2022   Anemia associated with diabetes mellitus due to underlying condition (HCC) 12/17/2021   Insulin  long-term use (HCC) 09/25/2021   Chronic cough 09/09/2021   Seizures (HCC)    Adult failure to thrive syndrome 06/17/2021   Major depression, recurrent, chronic 06/02/2021   Essential (primary) hypertension 05/27/2021   RLS (restless legs syndrome) 04/21/2021   Type 2 diabetes mellitus with stage 4 chronic kidney disease, with long-term current use of insulin  (HCC) 03/12/2021   Shoulder impingement syndrome 02/18/2021   Quadriplegia, C1-C4 incomplete (HCC) 02/17/2021   Chronic anxiety 02/17/2021   Protein-calorie malnutrition, severe 02/17/2021   Aortic atherosclerosis 02/17/2021   DNR (do not resuscitate) 02/12/2021   Central cord syndrome at C4 level of cervical spinal cord (HCC) 02/05/2021   Urinary retention 01/31/2021   Hyperlipidemia associated with type 2 diabetes mellitus (  HCC) 12/05/2020   Type 2 diabetes mellitus with diabetic neuropathy, unspecified (HCC) 12/05/2020   GERD without esophagitis 12/05/2020   Chronic constipation 12/05/2020   Diabetic peripheral neuropathy (HCC) 12/05/2020   Avascular necrosis of bones of both hips (HCC) 12/05/2020   History of anemia due to CKD    Spinal stenosis in cervical region 01/27/2019    Degeneration of lumbar intervertebral disc 12/12/2018   PCP:  [name], NP Pharmacy:   [facility] - [place], [place] - [address] [place] [address] Phone: [phone] Fax: [phone]     Social Drivers of Health (SDOH) Social History: SDOH Screenings   Food Insecurity: No Food Insecurity (07/29/2024)  Housing: Low Risk  (07/29/2024)  Transportation Needs: No Transportation Needs (07/29/2024)  Utilities: Not At Risk (07/29/2024)  Depression (PHQ2-9): Low Risk  (05/31/2024)  Social Connections: Unknown (07/29/2024)  Tobacco Use: Low Risk  (07/29/2024)   SDOH Interventions:

## 2024-07-30 NOTE — Care Management Obs Status (Signed)
 MEDICARE OBSERVATION STATUS NOTIFICATION   Patient Details  Name: [name] MRN: [id number] Date of Birth: 1936/01/14   Medicare Observation Status Notification Given:  Yes    Sharlyne Stabs, RN 07/30/2024, 9:07 AM

## 2024-07-30 NOTE — Plan of Care (Signed)
  Problem: Safety: Goal: Ability to remain free from injury will improve Outcome: Progressing   Problem: Coping: Goal: Ability to adjust to condition or change in health will improve Outcome: Progressing   

## 2024-07-31 DIAGNOSIS — N184 Chronic kidney disease, stage 4 (severe): Secondary | ICD-10-CM | POA: Diagnosis not present

## 2024-07-31 DIAGNOSIS — K922 Gastrointestinal hemorrhage, unspecified: Principal | ICD-10-CM

## 2024-07-31 DIAGNOSIS — D649 Anemia, unspecified: Secondary | ICD-10-CM | POA: Diagnosis not present

## 2024-07-31 DIAGNOSIS — R310 Gross hematuria: Secondary | ICD-10-CM

## 2024-07-31 DIAGNOSIS — N3001 Acute cystitis with hematuria: Secondary | ICD-10-CM | POA: Diagnosis not present

## 2024-07-31 DIAGNOSIS — Z515 Encounter for palliative care: Secondary | ICD-10-CM

## 2024-07-31 DIAGNOSIS — G8252 Quadriplegia, C1-C4 incomplete: Secondary | ICD-10-CM | POA: Diagnosis not present

## 2024-07-31 LAB — BASIC METABOLIC PANEL WITH GFR
Anion gap: 11 (ref 5–15)
BUN: 78 mg/dL — ABNORMAL HIGH (ref 8–23)
CO2: 23 mmol/L (ref 22–32)
Calcium: 7.9 mg/dL — ABNORMAL LOW (ref 8.9–10.3)
Chloride: 106 mmol/L (ref 98–111)
Creatinine, Ser: 2 mg/dL — ABNORMAL HIGH (ref 0.44–1.00)
GFR, Estimated: 24 mL/min — ABNORMAL LOW (ref >60)
Glucose, Bld: 141 mg/dL — ABNORMAL HIGH (ref 70–99)
Potassium: 4.3 mmol/L (ref 3.5–5.1)
Sodium: 140 mmol/L (ref 135–145)

## 2024-07-31 LAB — URINE CULTURE
Culture: >=100000 — AB
Culture: >=100000 — AB

## 2024-07-31 LAB — GLUCOSE, CAPILLARY
Glucose-Capillary: 133 mg/dL — ABNORMAL HIGH (ref 70–99)
Glucose-Capillary: 114 mg/dL — ABNORMAL HIGH (ref 70–99)
Glucose-Capillary: 102 mg/dL — ABNORMAL HIGH (ref 70–99)
Glucose-Capillary: 131 mg/dL — ABNORMAL HIGH (ref 70–99)
Glucose-Capillary: 86 mg/dL (ref 70–99)

## 2024-07-31 LAB — CBC
HCT: 24.1 % — ABNORMAL LOW (ref 36.0–46.0)
Hemoglobin: 7.8 g/dL — ABNORMAL LOW (ref 12.0–15.0)
MCH: 32.4 pg (ref 26.0–34.0)
MCHC: 32.4 g/dL (ref 30.0–36.0)
MCV: 100 fL (ref 80.0–100.0)
Platelets: 257 K/uL (ref 150–400)
RBC: 2.41 MIL/uL — ABNORMAL LOW (ref 3.87–5.11)
RDW: 15 % (ref 11.5–15.5)
WBC: 6.3 K/uL (ref 4.0–10.5)
nRBC: 0 % (ref 0.0–0.2)

## 2024-07-31 LAB — MAGNESIUM: Magnesium: 3.4 mg/dL — ABNORMAL HIGH (ref 1.7–2.4)

## 2024-07-31 MED ORDER — LACTATED RINGERS IV SOLN: INTRAVENOUS | Status: AC

## 2024-07-31 MED ORDER — LINACLOTIDE 145 MCG PO CAPS
290.0000 ug | ORAL_CAPSULE | Freq: Every day | ORAL | Status: DC
  Administered 2024-07-31 – 2024-08-02 (×2): 290 ug via ORAL
  Filled 2024-07-31 (×2): qty 2

## 2024-07-31 MED ORDER — ONDANSETRON HCL 4 MG/2ML IJ SOLN
4.0000 mg | Freq: Four times a day (QID) | INTRAMUSCULAR | Status: DC | PRN
  Administered 2024-08-02: 4 mg via INTRAVENOUS
  Filled 2024-07-31: qty 2

## 2024-07-31 MED ORDER — MILK AND MOLASSES ENEMA
1.0000 | Freq: Once | RECTAL | Status: AC
  Administered 2024-07-31: 150 mL via RECTAL

## 2024-07-31 MED ORDER — POLYETHYLENE GLYCOL 3350 17 G PO PACK
17.0000 g | PACK | Freq: Two times a day (BID) | ORAL | Status: DC
  Administered 2024-07-31 – 2024-08-02 (×5): 17 g via ORAL
  Filled 2024-07-31 (×5): qty 1

## 2024-07-31 MED ORDER — BISACODYL 10 MG RE SUPP
10.0000 mg | Freq: Every evening | RECTAL | Status: DC
  Administered 2024-07-31 – 2024-08-01 (×2): 10 mg via RECTAL
  Filled 2024-07-31 (×2): qty 1

## 2024-07-31 NOTE — Progress Notes (Addendum)
 PROGRESS NOTE  [name] FMW:[id number] DOB: August 15, 1936 DOA: 07/29/2024 PCP: [name], NP  Brief History:  88 year old female with a history of of C4 central cord syndrome with quadriplegia, diabetes mellitus type 2, hypertension, hyperlipidemia, CKD stage IV, MDD, Rosen[place]ower, cognitive impairment, constipation, and neurogenic bladder with chronic Foley catheter presenting from [facility] nursing center secondary to low hemoglobin on blood work with Hgb 6.9.  The patient is a poor historian secondary to her cognitive impairment.  History is supplemented by the patient's daughter and review of medical record.  Patient's daughter states that the patient has been a little bit more somnolent for the past month.  At the same time, she states that the patient continues to have is currently decline over the last 6 to 12 months.  Daughter is not aware of any hematochezia or melena.  The patient has not had any fevers, chills, chest pain, shortness breath, hemoptysis, abd pain, vomiting, diarrhea.  P.o. intake has been poor.  At baseline, the patient is essentially bedbound.  In the ED, the patient was afebrile and hemodynamically stable with oxygen saturation 98% on room air.  WBC 8.4, hemoglobin 6.7, platelets 323.  Sodium 138, potassium 4 point, bicarbonate 25, serum creatinine 2.73.  AST 30, ALT 18, capacity 0, total bilirubin <0.2.  EKG shows sinus rhythm nonspecific T wave changes. Hemoccult was positive.  GI was consulted to assist with management.    Assessment/Plan: Acute Blood Loss Anemia/Heme Positive Stool -GI consulted -transfused one unit PRBC 11/29 - Patient had hemoglobin of 7.4 on 07/11/2024 and hemoglobin 8.5 on 06/19/2024 - Continue pantoprazole  twice daily - melena noted on dis-impaction 12/1   Hematuria - Certainly this could be contributing to the patient's drop in hemoglobin - Review of the medical record shows that the patient has had intermittent hematuria  since 02/11/2022 - Send for urine culture - Patient has chronic indwelling Foley catheter - Foley catheter was exchanged on 07/28/2024--it appears to be draining properly - appreciate Dr. Sherrilee consult>>outpt cystoscopy - At the minimum, patient will need outpatient urology follow-up - Holding Farxiga    CAUTI -UA>50 WBC -11/28 and 11/29 urine culture = E coli & Providencia - hold Farxiga    Acute on chronic renal failure--CKD stage IV - Baseline creatinine 1.8-2.1 - Presented with serum creatinine 2.72 - Secondary to hemodynamic changes and volume depletion - one unit PRBC given - Continue IV fluids>>improving   Acute Metabolic Encephalopathy -due to UTI and AKI -11/30--pt awake and alert.  Follows commands.  Speaks fluently, although pleasantly confused, knows she's in hospital -11/30 sister at beside states she is near baseline   Central cord syndrome at C4 level  C-spine cord compression/quadriplegia C1-4  -holding aspirin  -occurred 01/25/21   Hyperkalemia -given lokelma  -IV bicarb, IV calcium  gluconate given -started IV NS -place on tele - improved   Unstageable sacral decubitus ulcer - Wound care consult appreciated - present prior to admission - photographs taken   Diabetes mellitus type 2 with hyperglycemia - Holding Lantus  temporarily - Sliding scale - Holding Farxiga  - 06/19/2024 hemoglobin A1c 6.7   Essential hypertension - Holding amlodipine secondary to soft blood pressure   Seizure disorder - Continue Keppra  500 mg twice daily and Lamictal  150 mg twice daily   Hyperlipidemia - Continue statin   Chronic constipation - Continue MiraLAX  and senna     Neurogenic bladder - Patient has chronic indwelling Foley catheter PTA - Foley catheter was changed  on 07/20/2024   Major neurocognitive disorder - Risk for hospital delirium - Holding Requip  for now as this can cause worsening mental status and delirium   Macrocytic anemia - 06/20/2024 B12  1090 - 06/20/2024 folic acid  >20   MDD - Continue duloxetine   GOC -palliative consulted.   -not ready for full comfort -does not want EGD           Family Communication:   sister at bedside 12/1   Consultants:  GI   Code Status:  DNR   DVT Prophylaxis:  SCDs     Procedures: As Listed in Progress Note Above   Antibiotics: Ceftriaxone  11/29>>         Subjective: Pt denies cp, sob, n/v, abd pain  Objective: Vitals:   07/31/24 1400 07/31/24 1500 07/31/24 1600 07/31/24 1700  BP: (!) 101/42 (!) 105/38 (!) 122/36 (!) 108/41  Pulse: 63 64 73 65  Resp: 13 14 15 15   Temp:    (!) 97.5 F (36.4 C)  TempSrc:    Axillary  SpO2: 99% 100% 100% 100%  Weight:      Height:        Intake/Output Summary (Last 24 hours) at 07/31/2024 1736 Last data filed at 07/31/2024 0528 Gross per 24 hour  Intake 240 ml  Output 1350 ml  Net -1110 ml   Weight change:  Exam:  General:  Pt is alert, follows commands appropriately, not in acute distress HEENT: No icterus, No thrush, No neck mass, [place]/AT Cardiovascular: RRR, S1/S2, no rubs, no gallops Respiratory: CTA bilaterally, no wheezing, no crackles, no rhonchi Abdomen: Soft/+BS, non tender, non distended, no guarding Extremities: No edema, No lymphangitis, No petechiae, No rashes, no synovitis   Data Reviewed: I have personally reviewed following labs and imaging studies Basic Metabolic Panel: Recent Labs  Lab 07/29/24 0950 07/29/24 1652 07/30/24 0333 07/31/24 0351  NA 138  --  140 140  K 5.8*  --  4.4 4.3  CL 101  --  104 106  CO2 25  --  21* 23  GLUCOSE 175*  --  112* 141*  BUN 93*  --  88* 78*  CREATININE 2.72*  --  2.37* 2.00*  CALCIUM  8.7*  --  8.2* 7.9*  MG  --  3.8* 3.5* 3.4*   Liver Function Tests: Recent Labs  Lab 07/29/24 0950  AST 30  ALT 18  ALKPHOS 81  BILITOT <0.2  PROT 6.5  ALBUMIN  3.2*   No results for input(s): LIPASE, AMYLASE in the last 168 hours. Recent Labs  Lab 07/29/24 1652   AMMONIA 27   Coagulation Profile: Recent Labs  Lab 07/29/24 1652  INR 1.0   CBC: Recent Labs  Lab 07/29/24 0835 07/29/24 0950 07/29/24 2100 07/30/24 0333 07/31/24 0351  WBC 8.7 8.4  --  6.8 6.3  NEUTROABS 5.9  --   --   --   --   HGB 6.9* 6.7* 8.7* 8.2* 7.8*  HCT 21.8* 21.4* 27.0* 25.4* 24.1*  MCV 100.9* 100.9*  --  99.2 100.0  PLT 317 323  --  258 257   Cardiac Enzymes: No results for input(s): CKTOTAL, CKMB, CKMBINDEX, TROPONINI in the last 168 hours. BNP: Invalid input(s): POCBNP CBG: Recent Labs  Lab 07/30/24 2101 07/31/24 0758 07/31/24 0958 07/31/24 1154 07/31/24 1637  GLUCAP 149* 133* 114* 102* 131*   HbA1C: No results for input(s): HGBA1C in the last 72 hours. Urine analysis:    Component Value Date/Time   COLORURINE RED (A)  07/29/2024 1026   APPEARANCEUR TURBID (A) 07/29/2024 1026   LABSPEC  07/29/2024 1026    TEST NOT REPORTED DUE TO COLOR INTERFERENCE OF URINE PIGMENT   PHURINE  07/29/2024 1026    TEST NOT REPORTED DUE TO COLOR INTERFERENCE OF URINE PIGMENT   GLUCOSEU (A) 07/29/2024 1026    TEST NOT REPORTED DUE TO COLOR INTERFERENCE OF URINE PIGMENT   HGBUR (A) 07/29/2024 1026    TEST NOT REPORTED DUE TO COLOR INTERFERENCE OF URINE PIGMENT   BILIRUBINUR (A) 07/29/2024 1026    TEST NOT REPORTED DUE TO COLOR INTERFERENCE OF URINE PIGMENT   KETONESUR (A) 07/29/2024 1026    TEST NOT REPORTED DUE TO COLOR INTERFERENCE OF URINE PIGMENT   PROTEINUR (A) 07/29/2024 1026    TEST NOT REPORTED DUE TO COLOR INTERFERENCE OF URINE PIGMENT   NITRITE (A) 07/29/2024 1026    TEST NOT REPORTED DUE TO COLOR INTERFERENCE OF URINE PIGMENT   LEUKOCYTESUR (A) 07/29/2024 1026    TEST NOT REPORTED DUE TO COLOR INTERFERENCE OF URINE PIGMENT   Sepsis Labs: @LABRCNTIP (procalcitonin:4,lacticidven:4) ) Recent Results (from the past 240 hours)  Culture, Urine (Do not remove urinary catheter, catheter placed by urology or difficult to place)     Status:  Abnormal   Collection Time: 07/28/24  2:21 PM   Specimen: Urine, Catheterized  Result Value Ref Range Status   Specimen Description   Final    URINE, CATHETERIZED Performed at [facility], [address], [place], [place] [address]    Special Requests   Final    NONE Performed at [facility], [address] [place], [place] [address]    Culture (A)  Final    >=100,000 COLONIES/mL ESCHERICHIA COLI >=100,000 COLONIES/mL PROVIDENCIA STUARTII    Report Status 07/31/2024 FINAL  Final   Organism ID, Bacteria ESCHERICHIA COLI (A)  Final   Organism ID, Bacteria PROVIDENCIA STUARTII (A)  Final      Susceptibility   Escherichia coli - MIC*    AMPICILLIN 8 SENSITIVE Sensitive     CEFAZOLIN  (URINE) Value in next row Sensitive      2 SENSITIVEThis is a modified FDA-approved test that has been validated and its performance characteristics determined by the reporting laboratory.  This laboratory is certified under the Clinical Laboratory Improvement Amendments CLIA as qualified to perform high complexity clinical laboratory testing.    CEFEPIME Value in next row Sensitive      2 SENSITIVEThis is a modified FDA-approved test that has been validated and its performance characteristics determined by the reporting laboratory.  This laboratory is certified under the Clinical Laboratory Improvement Amendments CLIA as qualified to perform high complexity clinical laboratory testing.    ERTAPENEM Value in next row Sensitive      2 SENSITIVEThis is a modified FDA-approved test that has been validated and its performance characteristics determined by the reporting laboratory.  This laboratory is certified under the Clinical Laboratory Improvement Amendments CLIA as qualified to perform high complexity clinical laboratory testing.    CEFTRIAXONE  Value in next row Sensitive      2 SENSITIVEThis is a modified FDA-approved test that has been validated and its performance characteristics determined by the reporting  laboratory.  This laboratory is certified under the Clinical Laboratory Improvement Amendments CLIA as qualified to perform high complexity clinical laboratory testing.    CIPROFLOXACIN Value in next row Intermediate      2 SENSITIVEThis is a modified FDA-approved test that has been validated and its performance characteristics determined  by the reporting laboratory.  This laboratory is certified under the Clinical Laboratory Improvement Amendments CLIA as qualified to perform high complexity clinical laboratory testing.    GENTAMICIN Value in next row Sensitive      2 SENSITIVEThis is a modified FDA-approved test that has been validated and its performance characteristics determined by the reporting laboratory.  This laboratory is certified under the Clinical Laboratory Improvement Amendments CLIA as qualified to perform high complexity clinical laboratory testing.    NITROFURANTOIN Value in next row Sensitive      2 SENSITIVEThis is a modified FDA-approved test that has been validated and its performance characteristics determined by the reporting laboratory.  This laboratory is certified under the Clinical Laboratory Improvement Amendments CLIA as qualified to perform high complexity clinical laboratory testing.    TRIMETH/SULFA Value in next row Sensitive      2 SENSITIVEThis is a modified FDA-approved test that has been validated and its performance characteristics determined by the reporting laboratory.  This laboratory is certified under the Clinical Laboratory Improvement Amendments CLIA as qualified to perform high complexity clinical laboratory testing.    AMPICILLIN/SULBACTAM Value in next row Sensitive      2 SENSITIVEThis is a modified FDA-approved test that has been validated and its performance characteristics determined by the reporting laboratory.  This laboratory is certified under the Clinical Laboratory Improvement Amendments CLIA as qualified to perform high complexity clinical  laboratory testing.    PIP/TAZO Value in next row Sensitive      <=4 SENSITIVEThis is a modified FDA-approved test that has been validated and its performance characteristics determined by the reporting laboratory.  This laboratory is certified under the Clinical Laboratory Improvement Amendments CLIA as qualified to perform high complexity clinical laboratory testing.    MEROPENEM Value in next row Sensitive      <=4 SENSITIVEThis is a modified FDA-approved test that has been validated and its performance characteristics determined by the reporting laboratory.  This laboratory is certified under the Clinical Laboratory Improvement Amendments CLIA as qualified to perform high complexity clinical laboratory testing.    * >=100,000 COLONIES/mL ESCHERICHIA COLI   Providencia stuartii - MIC*    AMPICILLIN Value in next row Resistant      <=4 SENSITIVEThis is a modified FDA-approved test that has been validated and its performance characteristics determined by the reporting laboratory.  This laboratory is certified under the Clinical Laboratory Improvement Amendments CLIA as qualified to perform high complexity clinical laboratory testing.    CEFEPIME Value in next row Sensitive      <=4 SENSITIVEThis is a modified FDA-approved test that has been validated and its performance characteristics determined by the reporting laboratory.  This laboratory is certified under the Clinical Laboratory Improvement Amendments CLIA as qualified to perform high complexity clinical laboratory testing.    ERTAPENEM Value in next row Sensitive      <=4 SENSITIVEThis is a modified FDA-approved test that has been validated and its performance characteristics determined by the reporting laboratory.  This laboratory is certified under the Clinical Laboratory Improvement Amendments CLIA as qualified to perform high complexity clinical laboratory testing.    CEFTRIAXONE  Value in next row Sensitive      <=4 SENSITIVEThis is a  modified FDA-approved test that has been validated and its performance characteristics determined by the reporting laboratory.  This laboratory is certified under the Clinical Laboratory Improvement Amendments CLIA as qualified to perform high complexity clinical laboratory testing.    CIPROFLOXACIN Value in next row Resistant      <=  4 SENSITIVEThis is a modified FDA-approved test that has been validated and its performance characteristics determined by the reporting laboratory.  This laboratory is certified under the Clinical Laboratory Improvement Amendments CLIA as qualified to perform high complexity clinical laboratory testing.    GENTAMICIN Value in next row Resistant      <=4 SENSITIVEThis is a modified FDA-approved test that has been validated and its performance characteristics determined by the reporting laboratory.  This laboratory is certified under the Clinical Laboratory Improvement Amendments CLIA as qualified to perform high complexity clinical laboratory testing.    NITROFURANTOIN Value in next row Resistant      <=4 SENSITIVEThis is a modified FDA-approved test that has been validated and its performance characteristics determined by the reporting laboratory.  This laboratory is certified under the Clinical Laboratory Improvement Amendments CLIA as qualified to perform high complexity clinical laboratory testing.    TRIMETH/SULFA Value in next row Sensitive      <=4 SENSITIVEThis is a modified FDA-approved test that has been validated and its performance characteristics determined by the reporting laboratory.  This laboratory is certified under the Clinical Laboratory Improvement Amendments CLIA as qualified to perform high complexity clinical laboratory testing.    AMPICILLIN/SULBACTAM Value in next row Resistant      <=4 SENSITIVEThis is a modified FDA-approved test that has been validated and its performance characteristics determined by the reporting laboratory.  This laboratory is  certified under the Clinical Laboratory Improvement Amendments CLIA as qualified to perform high complexity clinical laboratory testing.    PIP/TAZO Value in next row Sensitive      <=4 SENSITIVEThis is a modified FDA-approved test that has been validated and its performance characteristics determined by the reporting laboratory.  This laboratory is certified under the Clinical Laboratory Improvement Amendments CLIA as qualified to perform high complexity clinical laboratory testing.    MEROPENEM Value in next row Sensitive      <=4 SENSITIVEThis is a modified FDA-approved test that has been validated and its performance characteristics determined by the reporting laboratory.  This laboratory is certified under the Clinical Laboratory Improvement Amendments CLIA as qualified to perform high complexity clinical laboratory testing.    * >=100,000 COLONIES/mL PROVIDENCIA STUARTII  Culture, Urine (Do not remove urinary catheter, catheter placed by urology or difficult to place)     Status: Abnormal   Collection Time: 07/29/24 10:26 AM   Specimen: Urine, Catheterized  Result Value Ref Range Status   Specimen Description   Final    URINE, CATHETERIZED Performed at [facility], [address], [place], [place] [address]    Special Requests   Final    NONE Performed at [facility], [address] [place], [place] [address]    Culture (A)  Final    >=100,000 COLONIES/mL ESCHERICHIA COLI >=100,000 COLONIES/mL PROVIDENCIA STUARTII SUSCEPTIBILITIES PERFORMED ON PREVIOUS CULTURE WITHIN THE LAST 5 DAYS. Performed at [facility], [address] [place], [place] [address]    Report Status 07/31/2024 FINAL  Final  MRSA Next Gen by PCR, Nasal     Status: None   Collection Time: 07/29/24  3:00 PM   Specimen: Nasal Mucosa; Nasal Swab  Result Value Ref Range Status   MRSA by PCR Next Gen NOT DETECTED NOT DETECTED Final    Comment: (NOTE) The GeneXpert MRSA Assay (FDA approved for NASAL specimens  only), is one component of a comprehensive MRSA colonization surveillance program. It is not intended to diagnose MRSA infection nor to guide or  monitor treatment for MRSA infections. Test performance is not FDA approved in patients less than 30 years old. Performed at [facility], [address], [place], [place] [address]      Scheduled Meds:  acetaminophen   650 mg Oral TID   ascorbic acid   500 mg Oral Daily   bisacodyl   10 mg Rectal QPM   Chlorhexidine  Gluconate Cloth  6 each Topical Daily   collagenase    Topical Daily   DULoxetine   20 mg Oral Daily   feeding supplement  1 Container Oral TID BM   fesoterodine   4 mg Oral Daily   folic acid   1 mg Oral Daily   gabapentin   300 mg Oral BID   insulin  aspart  0-5 Units Subcutaneous QHS   insulin  aspart  0-9 Units Subcutaneous TID WC   lamoTRIgine   150 mg Oral BID   levETIRAcetam   500 mg Intravenous Q24H   linaclotide   290 mcg Oral QAC breakfast   pantoprazole  (PROTONIX ) IV  40 mg Intravenous Q12H   polyethylene glycol  17 g Oral BID   rosuvastatin   20 mg Oral QHS   Continuous Infusions:  cefTRIAXone  (ROCEPHIN )  IV 1 g (07/31/24 1353)    Procedures/Studies: CT HEAD WO CONTRAST ( ) Result Date: 07/29/2024 EXAM: CT HEAD WITHOUT CONTRAST 07/29/2024 02:48:59 PM TECHNIQUE: CT of the head was performed without the administration of intravenous contrast. Automated exposure control, iterative reconstruction, and/or weight based adjustment of the mA/kV was utilized to reduce the radiation dose to as low as reasonably achievable. COMPARISON: Head CT 03/23/2023. Brain MRI 07/07/2021. CLINICAL HISTORY: 88 year old female with altered mental status. FINDINGS: BRAIN AND VENTRICLES: Brain volume remains normal for age. No acute hemorrhage. No evidence of acute infarct. No hydrocephalus. No extra-axial collection. No mass effect or midline shift. Gray white differentiation is stable and largely normal for age. Minimal to mild chronic white matter  changes. Calcified atherosclerosis at the skull base. No suspicious intracranial vascular hyperdensity. ORBITS: No acute abnormality. 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 Chronic small volume fluid or mucosal thickening in the left sphenoid sinus is stable since last year. SOFT TISSUES AND SKULL: No acute soft tissue abnormality. No skull fracture. Tympanic cavities remain well aerated. Improved left mastoid aeration since last year, mild mastoid effusions now, significance doubtful. IMPRESSION: 1. No acute intracranial abnormality. Negative for age non-contrast CT appearance of the brain. Electronically signed by: Helayne Hurst MD 07/29/2024 05:21 PM EST RP Workstation: [id number]    Alm Schneider, DO  [facility]  If 7PM-7AM, please contact night-coverage [email or web address] Password [email or web address] 07/31/2024, 5:36 PM   LOS: 1 day

## 2024-07-31 NOTE — [place] FL2 (Signed)
 [place]  MEDICAID FL2 LEVEL OF CARE FORM     IDENTIFICATION  Patient Name: [name] Birthdate: [birth year] Sex: female Admission Date (Current Location): 07/29/2024  [place] and [place] Number:  [place] [id number] Facility and Address:  [facility],  [address] [place] [address]      Provider Number: [id number]  Attending Physician Name and Address:  Evonnie Lenis, MD  Relative Name and Phone Number:       Current Level of Care: Hospital Recommended Level of Care: Skilled Nursing Facility Prior Approval Number:    Date Approved/Denied:   PASRR Number:    Discharge Plan: SNF    Current Diagnoses: Patient Active Problem List   Diagnosis Date Noted   Symptomatic anemia 07/29/2024   CKD (chronic kidney disease) stage 4, GFR 15-29 ml/min (HCC) 07/29/2024   Hypercalcemia due to chronic kidney disease 06/20/2024   Macrocytic anemia 06/20/2024   Folate deficiency 02/29/2024   Foley catheter in place 03/23/2023   Neurogenic bladder 03/01/2023   Anemia associated with type 2 diabetes mellitus (HCC) 03/01/2023   CKD stage 4 due to type 2 diabetes mellitus (HCC) 03/01/2023   Persistent gross hematuria 02/19/2023   UTI (urinary tract infection) 02/19/2023   High risk medications (not anticoagulants) long-term use 01/27/2023   Neurocognitive deficits 10/26/2022   Abdominal pain 10/01/2022   Hypertension associated with stage 4 chronic kidney disease due to type 2 diabetes mellitus (HCC) 08/21/2022   Hypothyroidism (acquired) 04/30/2022   Post-menopausal osteoporosis 04/23/2022   Anemia associated with diabetes mellitus due to underlying condition (HCC) 12/17/2021   Insulin  long-term use (HCC) 09/25/2021   Chronic cough 09/09/2021   Seizures (HCC)    Adult failure to thrive syndrome 06/17/2021   Major depression, recurrent, chronic 06/02/2021   Essential (primary) hypertension 05/27/2021   RLS (restless legs syndrome) 04/21/2021   Type 2 diabetes  mellitus with stage 4 chronic kidney disease, with long-term current use of insulin  (HCC) 03/12/2021   Shoulder impingement syndrome 02/18/2021   Quadriplegia, C1-C4 incomplete (HCC) 02/17/2021   Chronic anxiety 02/17/2021   Protein-calorie malnutrition, severe 02/17/2021   Aortic atherosclerosis 02/17/2021   DNR (do not resuscitate) 02/12/2021   Central cord syndrome at C4 level of cervical spinal cord (HCC) 02/05/2021   Urinary retention 01/31/2021   Hyperlipidemia associated with type 2 diabetes mellitus (HCC) 12/05/2020   Type 2 diabetes mellitus with diabetic neuropathy, unspecified (HCC) 12/05/2020   GERD without esophagitis 12/05/2020   Chronic constipation 12/05/2020   Diabetic peripheral neuropathy (HCC) 12/05/2020   Avascular necrosis of bones of both hips (HCC) 12/05/2020   History of anemia due to CKD    Spinal stenosis in cervical region 01/27/2019   Degeneration of lumbar intervertebral disc 12/12/2018    Orientation RESPIRATION BLADDER Height & Weight     Self  Normal Indwelling catheter Weight: 153 lb (69.4 kg) Height:  5' 2 (157.5 cm)  BEHAVIORAL SYMPTOMS/MOOD NEUROLOGICAL BOWEL NUTRITION STATUS      Incontinent Diet (See d/c summary)  AMBULATORY STATUS COMMUNICATION OF NEEDS Skin   Total Care Verbally Bruising, Other (Comment) (Stage IV to buttocks with foam dressing)                       Personal Care Assistance Level of Assistance  Feeding, Bathing, Dressing Bathing Assistance: Maximum assistance Feeding assistance: Maximum assistance Dressing Assistance: Maximum assistance     Functional Limitations Info  Sight, Hearing, Speech Sight Info: Impaired Hearing Info: Adequate Speech Info:  Adequate    SPECIAL CARE FACTORS FREQUENCY                       Contractures      Additional Factors Info  Code Status, Allergies, Psychotropic Code Status Info: DNR- pre-arrest interventions desired Allergies Info: Ace Inhibitors  Codeine  Sulfa  Antibiotics Psychotropic Info: Cymbalta          Current Medications (07/31/2024):  This is the current hospital active medication list Current Facility-Administered Medications  Medication Dose Route Frequency Provider Last Rate Last Admin   acetaminophen  (TYLENOL ) tablet 650 mg  650 mg Oral TID Evonnie Lenis, MD   650 mg at 07/30/24 2102   ascorbic acid (VITAMIN C) tablet 500 mg  500 mg Oral Daily Tat, David, MD   500 mg at 07/30/24 1024   cefTRIAXone  (ROCEPHIN ) 1 g in sodium chloride  0.9 % 100 mL IVPB  1 g Intravenous Q24H Tat, Lenis, MD 200 mL/hr at 07/30/24 1423 1 g at 07/30/24 1423   Chlorhexidine  Gluconate Cloth 2 % PADS 6 each  6 each Topical Daily Tat, David, MD   6 each at 07/30/24 1115   collagenase (SANTYL) ointment   Topical Daily Tat, David, MD   Given at 07/30/24 0800   DULoxetine  (CYMBALTA ) DR capsule 20 mg  20 mg Oral Daily Tat, David, MD   20 mg at 07/30/24 1024   feeding supplement (BOOST / RESOURCE BREEZE) liquid 1 Container  1 Container Oral TID BM Evonnie Lenis, MD       fesoterodine (TOVIAZ) tablet 4 mg  4 mg Oral Daily Tat, David, MD   4 mg at 07/30/24 1024   folic acid  (FOLVITE ) tablet 1 mg  1 mg Oral Daily Tat, David, MD   1 mg at 07/30/24 1025   gabapentin  (NEURONTIN ) capsule 300 mg  300 mg Oral BID Tat, Lenis, MD   300 mg at 07/30/24 2102   insulin  aspart (novoLOG ) injection 0-5 Units  0-5 Units Subcutaneous QHS Tat, Lenis, MD       insulin  aspart (novoLOG ) injection 0-9 Units  0-9 Units Subcutaneous TID WC Tat, Lenis, MD   1 Units at 07/31/24 9196   lamoTRIgine  (LAMICTAL ) tablet 150 mg  150 mg Oral BID Tat, Lenis, MD   150 mg at 07/30/24 2102   levETIRAcetam  (KEPPRA ) undiluted injection 500 mg  500 mg Intravenous Q24H Evonnie Lenis, MD   500 mg at 07/30/24 1518   linaclotide  (LINZESS ) capsule 145 mcg  145 mcg Oral Daily Tat, Lenis, MD   145 mcg at 07/30/24 1024   pantoprazole  (PROTONIX ) injection 40 mg  40 mg Intravenous Q12H Tat, David, MD   40 mg at 07/30/24 2102    rosuvastatin  (CRESTOR ) tablet 20 mg  20 mg Oral QHS Tat, David, MD   20 mg at 07/30/24 2102     Discharge Medications: Please see discharge summary for a list of discharge medications.  Relevant Imaging Results:  Relevant Lab Results:   Additional Information    [place] Saddie Kim, LCSW

## 2024-07-31 NOTE — Consult Note (Cosign Needed Addendum)
 Gastroenterology Consult   Referring Provider: Dr Tat Primary Care Physician:  [name], NP Primary Gastroenterologist:  Dr. Cindie  Patient ID: [name]; [id number]; 1936-07-21   Admit date: 07/29/2024  LOS: 1 day   Date of Consultation: 07/31/2024  Reason for Consultation:  acute blood loss anemia, heme positive stool   History of Present Illness   [name] is an 88 y.o. year old female  with a history of CKD stage IV, anemia multifactorial with chronic disease and functional iron deficiency , diabetes, HTN, HLD, central cord syndrome at C4 with quadriplegia secondary to fall in May 2022, DM, cognitively impaired, chronic constipation with retained stool on CTs and fecal impaction in past, left-sided abdominal pain, mild diverticulitis June 2024,  chronic hematuria with indwelling foley, unstageable sacral ulcer, presenting this admission from [facility] nursing center after found to have Hgb 6.9. GI now consulted. Notably, she has also had a steady decline over the past year, increasing somnolence for the past month. Admitted with CAUTI, metabolic encephalopathy, acute on chronic renal failure, GI now consulted.   In the ED: afebrile, Hgb 6.7 (previously 7.4 a few weeks ago, baseline appears in the 8 range), creatinine 2.72, baseline in low 2 range, potassium 5.8, BUN 93, UA with >50 WBC and >50 RBC, urine culture with E.coli and Providencia, iron 32, ferritin 1276, ammonia level normal.   Received 1 unit PRBCs with improvement in Hgb to 8.7, drifting down again and now 7.8.   CT head completed due to somnolence without acute abnormality.   Folate >20 but hx of folate deficiency as was 2.9 several months ago.   Today: Patient is somnolent. Nursing staff at bedside. Sternal rub without grimacing, eyelids attempting to flutter but does not open eyes on own. Does not follow verbal commands. No overt GI bleeding per staff. Unclear when she had her last BM. No BM documented  since admission. NPO today. No family at bedside. Urine color has improved since admission, previously with clots coming from around foley. Now dark reddish/brown with sediment in foley bag. Per nursing staff, abdomen is round, no change in this over the weekend. No vomiting.   Last bowel regimen as outpatient: Linzess  72 mcg daily, Senokot 2 tablets each evening, dulcolax weekly  Currently on Linzess  145 mcg daily.   Prior CT without contrast in March 2025 with large amount of stool in rectosigmoid colon with distension of rectum, fecal impaction not excluded.    Colonoscopy January 2025: - Inadequate colon prep - Stool within the rectum and sigmoid colon - No evidence of stercoral colitis or diverticulosis on exam although limited view.  EGD 11/19/22: -Gastritis s/p biopsy -Normal duodenum -No esophageal stenosis or stricture -Biopsies negative for H. Pylori, hyperplasia seen consistent with PPI therapy.  -Advised PPI twice daily  EGD 09/22/21: -gastritis s/p biopsy -normal duodenum -omeprazole  40 mg daily -Reglan  5 mg QID  Past Medical History:  Diagnosis Date   Acute cystitis without hematuria    Adult failure to thrive    Anemia    Anxiety    Atherosclerosis of aorta    Central cord syndrome at C4 level of cervical spinal cord, subsequent encounter (HCC)    Chronic kidney disease, stage IV (severe) (HCC)    CKD (chronic kidney disease)    stage 3   Depression    DM type 2 with diabetic peripheral neuropathy (HCC)    Dysphagia    GERD (gastroesophageal reflux disease)    Gout  High cholesterol    HTN (hypertension)    Hyponatremia    MDD (major depressive disorder)    Neck pain    Neuropathy    Quadriplegia, C1-C4 incomplete (HCC)    Radiculopathy    RLS (restless legs syndrome)    Unspecified convulsions (HCC)    Urinary retention     Past Surgical History:  Procedure Laterality Date   ABDOMINAL HYSTERECTOMY     ANTERIOR CERVICAL DECOMP/DISCECTOMY  FUSION N/A 02/05/2021   Procedure: Cervical Three-Four  Anterior cervical decompression/discectomy/fusion;  Surgeon: Gillie Duncans, MD;  Location: [facility] OR;  Service: Neurosurgery;  Laterality: N/A;  RM 20   APPENDECTOMY     BACK SURGERY     BALLOON DILATION N/A 11/19/2022   Procedure: BALLOON DILATION;  Surgeon: Cindie Carlin POUR, DO;  [facility];  Service: Endoscopy;  Laterality: N/A;   BIOPSY  09/22/2021   Procedure: BIOPSY;  Surgeon: Cindie Carlin POUR, DO;  Location: [facility];  Service: Endoscopy;;   BIOPSY  11/19/2022   Procedure: BIOPSY;  Surgeon: Cindie Carlin POUR, DO;  [facility]: [facility];  Service: Endoscopy;;   CERVICAL DISC SURGERY     ESOPHAGOGASTRODUODENOSCOPY (EGD) WITH PROPOFOL  N/A 09/22/2021   Procedure: ESOPHAGOGASTRODUODENOSCOPY (EGD) WITH PROPOFOL ;  Surgeon: Cindie Carlin POUR, DO;  [facility]: [facility];  Service: Endoscopy;  Laterality: N/A;  1:00pm   ESOPHAGOGASTRODUODENOSCOPY (EGD) WITH PROPOFOL  N/A 11/19/2022   Procedure: ESOPHAGOGASTRODUODENOSCOPY (EGD) WITH PROPOFOL ;  Surgeon: Cindie Carlin POUR, DO;  [facility];  Service: Endoscopy;  Laterality: N/A;  11:30 am, asa 3   FLEXIBLE SIGMOIDOSCOPY  09/20/2023   Procedure: FLEXIBLE SIGMOIDOSCOPY;  Surgeon: Cindie Carlin POUR, DO;  [facility];  Service: Endoscopy;;   HAND SURGERY     KNEE SURGERY      Prior to Admission medications   Medication Sig Start Date End Date Taking? Authorizing Provider  acetaminophen  (TYLENOL ) 325 MG tablet Take 650 mg by mouth 3 (three) times daily.   Yes [provider]  aluminum-magnesium  hydroxide 200-200 MG/5ML suspension Take 15 mLs by mouth every 6 (six) hours as needed (Indigestion and heartburn).   Yes [provider]  Amino Acids-Protein Hydrolys (FEEDING SUPPLEMENT, PRO-STAT 64,) LIQD Take 30 mLs by mouth in the morning and at bedtime.   Yes [provider]  amLODipine (NORVASC) 10 MG tablet Take 10 mg by mouth daily.    Yes [provider]  ascorbic acid (VITAMIN C) 500 MG tablet Take 500 mg by mouth daily.   Yes [provider]  bisacodyl  (DULCOLAX) 10 MG suppository Place 10 mg rectally every Thursday.   Yes [provider]  camphor-menthol VIKKI) lotion Apply 1 Application topically 2 (two) times daily as needed for itching.   Yes [provider]  dapagliflozin  propanediol (FARXIGA ) 5 MG TABS tablet Take 5 mg by mouth daily.   Yes [provider]  dronabinol  (MARINOL ) 2.5 MG capsule Take 1 capsule (2.5 mg total) by mouth daily before lunch for 27 days. 07/07/24 08/03/24 Yes [name], NP  DULoxetine  (CYMBALTA ) 20 MG capsule Take 20 mg by mouth daily.   Yes [provider]  famotidine (PEPCID) 20 MG tablet Take 20 mg by mouth in the morning.   Yes [provider]  ferrous sulfate  325 (65 FE) MG EC tablet Take 325 mg by mouth 3 (three) times a week. Monday, Wednesday, and Friday.   Yes [provider]  folic acid  (FOLVITE ) 1 MG tablet  Take 1 mg by mouth daily.   Yes [provider]  gabapentin  (NEURONTIN ) 300 MG capsule Take 300 mg by mouth 2 (two) times daily.   Yes [provider]  insulin  aspart (NOVOLOG  FLEXPEN) 100 UNIT/ML FlexPen Inject 5 Units into the skin 3 (three) times daily with meals. 8 am, 12 pm, and 6 pm  Hold if pt eats <50% of meals   Yes [provider]  ipratropium-albuterol  (DUONEB) 0.5-2.5 (3) MG/3ML SOLN Take 3 mLs by nebulization every 6 (six) hours as needed (wheezing).   Yes [provider]  lamoTRIgine  (LAMICTAL ) 150 MG tablet Take 150 mg by mouth 2 (two) times daily.   Yes [provider]  levETIRAcetam  (KEPPRA ) 250 MG tablet Take 250 mg by mouth 2 (two) times daily. 06/23/24  Yes [provider]  LINZESS  145 MCG CAPS capsule Take 145 mcg by mouth daily. 07/23/24  Yes [provider]  loratadine (CLARITIN) 10 MG tablet Take 10 mg by mouth daily as  needed for allergies.   Yes [provider]  Magnesium  Hydroxide (MILK OF MAGNESIA PO) Take 30 mLs by mouth as needed (indigestion).   Yes [provider]  melatonin 5 MG TABS Take 5 mg by mouth at bedtime.   Yes [provider]  midazolam  (VERSED ) 10 MG/2ML SOLN injection Inject 0.4 mLs (2 mg total) into the muscle every 15 (fifteen) minutes as needed for agitation. Patient taking differently: Inject 2 mg into the muscle every 15 (fifteen) minutes as needed for agitation. Administer 10mg  (one 2ml vial) IM every 15 minutes  up to (4) total doses for seizures lasting >1 minute - 04/24/24  Yes [name], NP  ondansetron  (ZOFRAN -ODT) 4 MG disintegrating tablet Take 4 mg by mouth every 8 (eight) hours as needed for nausea or vomiting. 07/24/24  Yes [provider]  Phenylephrine -Cocoa Butter 0.25-85.5 % SUPP Place 1 suppository rectally 2 (two) times daily as needed (hemorrhoids).   Yes [provider]  rosuvastatin  (CRESTOR ) 20 MG tablet Take 20 mg by mouth at bedtime.   Yes [provider]  SANTYL 250 UNIT/GM ointment Apply 1 Application topically daily.  Apply to sacral wound daily per tx orders. 06/27/24  Yes [provider]  sennosides-docusate sodium  (SENOKOT-S) 8.6-50 MG tablet Take 2 tablets by mouth in the morning and at bedtime.   Yes [provider]  sodium bicarbonate  650 MG tablet Take 1 tablet (650 mg total) by mouth 2 (two) times daily. 02/24/23  Yes Adhikari, Amrit, MD  solifenacin (VESICARE) 5 MG tablet Take 5 mg by mouth at bedtime.   Yes [provider]  zinc sulfate, 50mg  elemental zinc, 220 (50 Zn) MG capsule Take 220 mg by mouth daily.   Yes [provider]  AMBULATORY NON FORMULARY MEDICATION Medication Name:  Continue monthly catheter changes with 61fr catheter at skilled nursing facility. 08/12/21   McKenzie, Belvie CROME, MD  Continuous Glucose Receiver (FREESTYLE LIBRE 2 READER) DEVI by  Does not apply route. Friday    [provider]  Continuous Glucose Sensor (FREESTYLE LIBRE 2 SENSOR) MISC  02/22/24   [provider]  Insulin  Pen Needle 30G X 5 MM MISC 1 Device by Does not apply route daily. 3/16    [name], NP    Current Facility-Administered Medications  Medication Dose Route Frequency Provider Last Rate Last Admin   acetaminophen  (TYLENOL ) tablet 650 mg  650 mg Oral TID Evonnie Lenis, MD   650 mg at  07/30/24 2102   ascorbic acid (VITAMIN C) tablet 500 mg  500 mg Oral Daily Tat, David, MD   500 mg at 07/30/24 1024   cefTRIAXone  (ROCEPHIN ) 1 g in sodium chloride  0.9 % 100 mL IVPB  1 g Intravenous Q24H Evonnie Lenis, MD 200 mL/hr at 07/30/24 1423 1 g at 07/30/24 1423   Chlorhexidine  Gluconate Cloth 2 % PADS 6 each  6 each Topical Daily Tat, Lenis, MD   6 each at 07/30/24 1115   collagenase (SANTYL) ointment   Topical Daily Tat, Lenis, MD   Given at 07/30/24 0800   DULoxetine  (CYMBALTA ) DR capsule 20 mg  20 mg Oral Daily Tat, David, MD   20 mg at 07/30/24 1024   feeding supplement (BOOST / RESOURCE BREEZE) liquid 1 Container  1 Container Oral TID BM Evonnie Lenis, MD       fesoterodine (TOVIAZ) tablet 4 mg  4 mg Oral Daily Tat, David, MD   4 mg at 07/30/24 1024   folic acid  (FOLVITE ) tablet 1 mg  1 mg Oral Daily Tat, David, MD   1 mg at 07/30/24 1025   gabapentin  (NEURONTIN ) capsule 300 mg  300 mg Oral BID Tat, Lenis, MD   300 mg at 07/30/24 2102   insulin  aspart (novoLOG ) injection 0-5 Units  0-5 Units Subcutaneous QHS Tat, Lenis, MD       insulin  aspart (novoLOG ) injection 0-9 Units  0-9 Units Subcutaneous TID WC Evonnie Lenis, MD   2 Units at 07/30/24 1727   lamoTRIgine  (LAMICTAL ) tablet 150 mg  150 mg Oral BID Tat, Lenis, MD   150 mg at 07/30/24 2102   levETIRAcetam  (KEPPRA ) undiluted injection 500 mg  500 mg Intravenous Q24H Evonnie Lenis, MD   500 mg at 07/30/24 1518   linaclotide  (LINZESS ) capsule 145 mcg  145 mcg Oral Daily Tat, Lenis, MD   145 mcg at  07/30/24 1024   pantoprazole  (PROTONIX ) injection 40 mg  40 mg Intravenous Q12H Tat, David, MD   40 mg at 07/30/24 2102   rosuvastatin  (CRESTOR ) tablet 20 mg  20 mg Oral QHS Tat, David, MD   20 mg at 07/30/24 2102    Allergies as of 07/29/2024 - Review Complete 07/29/2024  Allergen Reaction Noted   Ace inhibitors Other (See Comments) 08/29/2021   Codeine Other (See Comments) 03/09/2018   Sulfa antibiotics Other (See Comments) 03/25/2021    Family History  Problem Relation Age of Onset   Cancer Mother    Cardiomyopathy Father    Seizures Sister        childhood   Seizures Brother        in his 63s   Colon cancer Neg Hx     Social History   Socioeconomic History   Marital status: Widowed    Spouse name: Not on file   Number of children: Not on file   Years of education: Not on file   Highest education level: Not on file  Occupational History   Not on file  Tobacco Use   Smoking status: Never   Smokeless tobacco: Never  Vaping Use   Vaping status: Never Used  Substance and Sexual Activity   Alcohol use: Never   Drug use: Never   Sexual activity: Not Currently  Other Topics Concern   Not on file  Social History Narrative   L[facility] at [facility]   Social Drivers of Health   Financial Resource Strain: Not on file  Food Insecurity: No Food Insecurity (  07/29/2024)   Hunger Vital Sign    Worried About Running Out of Food in the Last Year: Never true    Ran Out of Food in the Last Year: Never true  Transportation Needs: No Transportation Needs (07/29/2024)   PRAPARE - Administrator, Civil Service (Medical): No    Lack of Transportation (Non-Medical): No  Physical Activity: Not on file  Stress: Not on file  Social Connections: Unknown (07/29/2024)   Social Connection and Isolation Panel    Frequency of Communication with Friends and Family: Patient declined    Frequency of Social Gatherings with Friends and Family: Patient declined    Attends  Religious Services: Patient declined    Database Administrator or Organizations: Patient declined    Attends Banker Meetings: Patient declined    Marital Status: Widowed  Intimate Partner Violence: Patient Unable To Answer (07/29/2024)   Humiliation, Afraid, Rape, and Kick questionnaire    Fear of Current or Ex-Partner: Patient unable to answer    Emotionally Abused: Patient unable to answer    Physically Abused: Patient unable to answer    Sexually Abused: Patient unable to answer     Review of Systems   Unable to obtain as obtunded  Physical Exam   Vital Signs in last 24 hours: Temp:  [97.4 F (36.3 C)-99.2 F (37.3 C)] 99.2 F (37.3 C) (12/01 0413) Pulse Rate:  [71-96] 71 (12/01 0600) Resp:  [14-19] 14 (12/01 0600) BP: (114-137)/(32-115) 130/44 (12/01 0600) SpO2:  [95 %-100 %] 100 % (12/01 0600)    General:  does not respond to verbal stimuli, no significant response to sternal rub, vitals stable, foley bag with large amount of sediment, dark brown/red urine Lungs:  Clear throughout to auscultation.    Heart:  S1 S2 present without murmur Abdomen:  distended and round, large AP diameter but soft, +BS, deep palpation without wincing but did cause patient to stir Rectal: lax sphincter tone, soft stool ball in rectum, stool dark green, no overt bleeding, no obvious melena    Msk:  muscle wasting all extremities, quadriplegia Extremities:  Without  edema. Skin:  large sacral wound with dressing covering   Intake/Output from previous day: 11/30 0701 - 12/01 0700 In: 2212 [P.O.:360; I.V.:1647; IV Piggyback:205] Out: 1350 [Urine:1350] Intake/Output this shift: No intake/output data recorded.    Labs/Studies   Recent Labs Recent Labs    07/29/24 0950 07/29/24 2100 07/30/24 0333 07/31/24 0351  WBC 8.4  --  6.8 6.3  HGB 6.7* 8.7* 8.2* 7.8*  HCT 21.4* 27.0* 25.4* 24.1*  PLT 323  --  258 257   BMET Recent Labs    07/29/24 0950 07/30/24 0333  07/31/24 0351  NA 138 140 140  K 5.8* 4.4 4.3  CL 101 104 106  CO2 25 21* 23  GLUCOSE 175* 112* 141*  BUN 93* 88* 78*  CREATININE 2.72* 2.37* 2.00*  CALCIUM  8.7* 8.2* 7.9*   LFT Recent Labs    07/29/24 0950  PROT 6.5  ALBUMIN  3.2*  AST 30  ALT 18  ALKPHOS 81  BILITOT <0.2   PT/INR Recent Labs    07/29/24 1652  LABPROT 14.1  INR 1.0     Radiology/Studies CT HEAD WO CONTRAST ( ) Result Date: 07/29/2024 EXAM: CT HEAD WITHOUT CONTRAST 07/29/2024 02:48:59 PM TECHNIQUE: CT of the head was performed without the administration of intravenous contrast. Automated exposure control, iterative reconstruction, and/or weight based adjustment of the mA/kV was utilized to  reduce the radiation dose to as low as reasonably achievable. COMPARISON: Head CT 03/23/2023. Brain MRI 07/07/2021. CLINICAL HISTORY: 88 year old female with altered mental status. FINDINGS: BRAIN AND VENTRICLES: Brain volume remains normal for age. No acute hemorrhage. No evidence of acute infarct. No hydrocephalus. No extra-axial collection. No mass effect or midline shift. Gray white differentiation is stable and largely normal for age. Minimal to mild chronic white matter changes. Calcified atherosclerosis at the skull base. No suspicious intracranial vascular hyperdensity. ORBITS: No acute abnormality. 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 Chronic small volume fluid or mucosal thickening in the left sphenoid sinus is stable since last year. SOFT TISSUES AND SKULL: No acute soft tissue abnormality. No skull fracture. Tympanic cavities remain well aerated. Improved left mastoid aeration since last year, mild mastoid effusions now, significance doubtful. IMPRESSION: 1. No acute intracranial abnormality. Negative for age non-contrast CT appearance of the brain. Electronically signed by: Helayne Hurst MD 07/29/2024 05:21 PM EST RP Workstation: [id number]   Lab Results  Component Value Date   TSH 3.293  02/24/2024     Assessment   [name] is an 88 y.o. year old female  with a history of CKD stage IV, anemia multifactorial with chronic disease and functional iron deficiency , diabetes, HTN, HLD, central cord syndrome at C4 with quadriplegia secondary to fall in May 2022, DM, cognitively impaired, chronic constipation with retained stool on CTs and fecal impaction in past, left-sided abdominal pain, mild diverticulitis June 2024,  chronic hematuria with indwelling foley, unstageable sacral ulcer, presenting this admission from [facility] nursing center after found to have Hgb 6.9. GI now consulted. Notably, she has also had a steady decline over the past year, increasing somnolence for the past month. GI now consulted.   Acute on chronic anemia: heme negative result in epic on admission, but per ED notes was heme positive on exam in ED with black stool. Hgb 6.7 on admission with improvement to 8.7 s/p 1 unit PRBCs. Hgb 7.8 today but no overt GI bleeding. Appears baseline around 8 range. On iron three times a week. Many sources for ongoing blood loss including chronic hematuria, sacral decubitus unstageable, suspect UGI source could be contributing as well, unable to exclude occult colonic etiology or small bowel etiology such as AVMs. No overt GI bleeding since admission, and rectal exam personally by me today with dark green stool, no overt blood noted. Suspect oral iron is contributing to darker stool. As she is obtunded today, will hold off on any procedures until she is back to baseline. Ammonia level normal on admission, CT head unrevealing. I doubt she would be able to complete an oral prep for colonoscopy. We could consider an EGD prior to discharge.   Constipation with fecal impaction: worsened in setting of paralysis. Needs aggressive bowel regimen. DRE today personally completed and has large, soft stool ball in place. Starting enemas and will also increase Linzess  along with adding Miralax .  Continue with electrolyte management, check TSH. At risk for ongoing fecal impaction and Ogilvie's type presentation in light of neuro status. Would benefit from every other day suppository to daily to help with stimulation.   Failure to thrive: appreciate palliative consultation pending. Need to discuss goals of care as family.   I spoke personally with granddaughter, who is legal guardian. [name] [phone]. She would like an EGD prior to discharge if patient clinically improves back to her baseline. I discussed we would need to reassess daily. Will tentatively  plan on EGD, consider enteroscopy as well to evaluate proximal small intestine, prior to discharge.    Plan / Recommendations    Continue IV PPI BID Increase Linzess  to 290 mcg daily Miralax  BID once able to tolerate oral intake safely Milk and molasses enema now Dulcolax suppository recommending each evening to every other day May need to have different outpatient regimen such as Motegrity and could even benefit from concomitant Linzess  or Amitiza. If watery stool with Linzess  despite washout period, would recommend Amitiza or Motegrity.  Supportive care at this time, not an endoscopic candidate until back to her baseline. Tentatively EGD prior to discharge if clinically improved, discussed with granddaughter. Not able to tolerate oral prep and family would like to avoid this, which is certainly appropriate.      07/31/2024, 7:15 AM  Therisa MICAEL Stager, PhD, ANP-BC [facility]   Discussed with Dr. Evonnie as well this morning. When he evaluated her, she was back to her baseline. Strongly encourage goals of care discussion with Palliative. Granddaughter desires an EGD prior to discharge, as long as the patient is willing. We will have her NPO after midnight and can reassess in the morning. Moving forward, would strongly favor conservative measures. I note that family has had routine discussions with NP and MD at [facility] regarding goals of care and concern for ongoing malnutrition, weight loss. Ideally, we could avoid oral iron as this will be constipating and instead favor IV iron.

## 2024-07-31 NOTE — Consult Note (Signed)
 Consultation Note Date: 07/31/2024   Patient Name: [name]  DOB: August 28, 1936  MRN: [id number]  Age / Sex: 88 y.o., female  PCP: [name], NP Referring Physician: Evonnie Lenis, MD  Reason for Consultation:  goals of care  HPI/Patient Profile: 88 y.o. female  with past medical history of C4 cord syndrome, quadraplegia, DM2, HTN, HLD, CKDIV, cognitive impairment, admitted on 07/29/2024 with anemia with Hgb 6.9. Hemoccult positive, GI consulted. Received blood transfusion x 1. Palliative consulted for assistance with medical decision making.    Primary Decision Maker HCPOA-  Granddaughter Animal Nutritionist  Discussion: Chart reviewed including labs, progress notes, imaging from this and previous encounters.  Labs today- CBC with improved hgb 7.8 (baseline is around 8).  Reviewed GI note- offered EGD but recommended conservative measures.  Met with patient and her granddaughter [name] and daughter [name].  Patient is known as loving to do things for others. She is famous for cooking her green beans, creamed potatoes and sweet potatoes.  She has had a difficult year and ongoing decline. She has lost weight. No appetite. She sleeps most of the time. Bedbound. Has sacral wound.  We discussed her current acute on chronic issues.  I shared that often changes in appetite, cognition and level of function can indicate a trajectory towards end of life.  Discussed how aggressive patient would want us  to be in prolonging her life. Patient is accepting of IV fluids and medications. She would want to return to the hospital and treat what is treatable if possible.  She is not ready for comfort focused/hospice type care.  She wants minimal non invasive procedures.  We discussed that a feeding tube would not be recommended in her situation.  Family does not want to proceed with EGD.  They are agreeable to referral for  outpatient Palliative referral to see at her long term care facility.     SUMMARY OF RECOMMENDATIONS -Anemia- likely multifactorial- CKD, chronic sacral wound, hemoccult positive- minimal noninvasive interventions desired.  - Goal is to d/c back to LTC with Palliative    Code Status/Advance Care Planning:   Code Status: Do not attempt resuscitation (DNR) PRE-ARREST INTERVENTIONS DESIRED    Prognosis:   < 6 months  Discharge Planning: LTC with Palliative  Primary Diagnoses: Present on Admission:  Symptomatic anemia  UTI (urinary tract infection)  Type 2 diabetes mellitus with diabetic neuropathy, unspecified (HCC)  Quadriplegia, C1-C4 incomplete (HCC)  Neurogenic bladder  DNR (do not resuscitate)   Review of Systems  Constitutional:  Positive for activity change and appetite change.    Physical Exam Vitals and nursing note reviewed.  Constitutional:      Comments: Lethargic, wakes easily for family  Cardiovascular:     Rate and Rhythm: Normal rate.     Pulses: Normal pulses.  Pulmonary:     Effort: Pulmonary effort is normal.  Neurological:     Comments: Somnolent      Vital Signs: BP (!) 123/40   Pulse 83   Temp 98.1  F (36.7 C) (Axillary)   Resp (!) 23   Ht 5' 2 (1.575 m)   Wt 69.4 kg   SpO2 100%   BMI 27.98 kg/m  Pain Scale: Faces POSS *See Group Information*: 2-Acceptable,Slightly drowsy, easily aroused Pain Score: Asleep   SpO2: SpO2: 100 % O2 Device:SpO2: 100 % O2 Flow Rate: .   IO: Intake/output summary:  Intake/Output Summary (Last 24 hours) at 07/31/2024 1330 Last data filed at 07/31/2024 0528 Gross per 24 hour  Intake 994.2 ml  Output 1350 ml  Net -355.8 ml    LBM: Last BM Date : 07/31/24 Baseline Weight: Weight: 69.4 kg Most recent weight: Weight: 69.4 kg       Thank you for this consult. Palliative medicine will continue to follow and assist as needed.  Time Total: 90 minutes Signed by: Cassondra Stain, AGNP-C Palliative  Medicine  Time includes:   Preparing to see the patient (e.g., review of tests) Obtaining and/or reviewing separately obtained history Performing a medically necessary appropriate examination and/or evaluation Counseling and educating the patient/family/caregiver Ordering medications, tests, or procedures Referring and communicating with other health care professionals (when not reported separately) Documenting clinical information in the electronic or other health record Independently interpreting results (not reported separately) and communicating results to the patient/family/caregiver Care coordination (not reported separately) Clinical documentation   Please contact Palliative Medicine Team phone at [phone] for questions and concerns.  For individual provider: See Tracey

## 2024-08-01 ENCOUNTER — Inpatient Hospital Stay (HOSPITAL_COMMUNITY)

## 2024-08-01 DIAGNOSIS — K5641 Fecal impaction: Secondary | ICD-10-CM | POA: Diagnosis not present

## 2024-08-01 DIAGNOSIS — D649 Anemia, unspecified: Secondary | ICD-10-CM | POA: Diagnosis not present

## 2024-08-01 DIAGNOSIS — L8990 Pressure ulcer of unspecified site, unspecified stage: Secondary | ICD-10-CM | POA: Insufficient documentation

## 2024-08-01 LAB — PREPARE RBC (CROSSMATCH)

## 2024-08-01 LAB — BASIC METABOLIC PANEL WITH GFR
Anion gap: 11 (ref 5–15)
BUN: 56 mg/dL — ABNORMAL HIGH (ref 8–23)
CO2: 22 mmol/L (ref 22–32)
Calcium: 7.8 mg/dL — ABNORMAL LOW (ref 8.9–10.3)
Chloride: 106 mmol/L (ref 98–111)
Creatinine, Ser: 1.72 mg/dL — ABNORMAL HIGH (ref 0.44–1.00)
GFR, Estimated: 29 mL/min — ABNORMAL LOW (ref >60)
Glucose, Bld: 98 mg/dL (ref 70–99)
Potassium: 4.2 mmol/L (ref 3.5–5.1)
Sodium: 140 mmol/L (ref 135–145)

## 2024-08-01 LAB — TSH: TSH: 3.9 u[IU]/mL (ref 0.350–4.500)

## 2024-08-01 LAB — CBC
HCT: 23.3 % — ABNORMAL LOW (ref 36.0–46.0)
Hemoglobin: 7.4 g/dL — ABNORMAL LOW (ref 12.0–15.0)
MCH: 32.3 pg (ref 26.0–34.0)
MCHC: 31.8 g/dL (ref 30.0–36.0)
MCV: 101.7 fL — ABNORMAL HIGH (ref 80.0–100.0)
Platelets: 237 K/uL (ref 150–400)
RBC: 2.29 MIL/uL — ABNORMAL LOW (ref 3.87–5.11)
RDW: 14.8 % (ref 11.5–15.5)
WBC: 5 K/uL (ref 4.0–10.5)
nRBC: 0 % (ref 0.0–0.2)

## 2024-08-01 LAB — MAGNESIUM: Magnesium: 3.3 mg/dL — ABNORMAL HIGH (ref 1.7–2.4)

## 2024-08-01 LAB — GLUCOSE, CAPILLARY
Glucose-Capillary: 105 mg/dL — ABNORMAL HIGH (ref 70–99)
Glucose-Capillary: 114 mg/dL — ABNORMAL HIGH (ref 70–99)
Glucose-Capillary: 126 mg/dL — ABNORMAL HIGH (ref 70–99)
Glucose-Capillary: 78 mg/dL (ref 70–99)

## 2024-08-01 LAB — HEMOGLOBIN AND HEMATOCRIT, BLOOD
HCT: 27.7 % — ABNORMAL LOW (ref 36.0–46.0)
Hemoglobin: 8.9 g/dL — ABNORMAL LOW (ref 12.0–15.0)

## 2024-08-01 MED ORDER — SODIUM CHLORIDE 0.9% IV SOLUTION: Freq: Once | INTRAVENOUS | Status: AC

## 2024-08-01 NOTE — Plan of Care (Signed)

## 2024-08-01 NOTE — Progress Notes (Signed)
 Gastroenterology Progress Note   Referring Provider: No ref. provider found Primary Care Physician:  [name], NP Primary Gastroenterologist:  Carlin POUR. Cindie, DO Patient ID: [name]; [id number]; August 27, 1936   Subjective:    Patient is more alert today. Daughter's at bedside. Nurse at bedside. Patient feels hungry today. Has clear liquid diet. Denies abdominal pain. Had some very dark stool yesterday. Stool per GI APP on DRE was dark green (likely due to iron). Today her stool was large, bristol 7, brown. Family and patient decline EGD. Goals are to go back to LTC with palliative but treat what is possible, wants minimal non invasive procedures.   No further blood clots around the foley catheter. Urine without gross hematuria.   Objective:   Vital signs in last 24 hours: Temp:  [97.5 F (36.4 C)-98.1 F (36.7 C)] 97.6 F (36.4 C) (12/02 1309) Pulse Rate:  [64-79] 68 (12/02 1309) Resp:  [12-25] 13 (12/02 1309) BP: (105-175)/(31-114) 164/46 (12/02 1309) SpO2:  [97 %-100 %] 100 % (12/02 1309) Last BM Date : 07/31/24 General:   Alert, responds appropriately. NAD Head:  Normocephalic and atraumatic. Eyes:  Sclera clear, no icterus.   Abdomen:  Soft, nontender and nondistended.  Normal bowel sounds, without guarding, and without rebound.   Extremities:  Without clubbing, deformity. Trace bilateral edema. Neurologic:  Alert   Skin:  Intact without significant lesions or rashes. Psych:  Alert and cooperative. Normal mood and affect.  Intake/Output from previous day: 12/01 0701 - 12/02 0700 In: 563.1 [I.V.:463.1; IV Piggyback:100] Out: 800 [Urine:800] Intake/Output this shift: Total I/O In: 262 [Blood:262] Out: -   Lab Results: CBC Recent Labs    07/30/24 0333 07/31/24 0351 08/01/24 0527  WBC 6.8 6.3 5.0  HGB 8.2* 7.8* 7.4*  HCT 25.4* 24.1* 23.3*  MCV 99.2 100.0 101.7*  PLT 258 257 237   BMET Recent Labs    07/30/24 0333 07/31/24 0351  08/01/24 0527  NA 140 140 140  K 4.4 4.3 4.2  CL 104 106 106  CO2 21* 23 22  GLUCOSE 112* 141* 98  BUN 88* 78* 56*  CREATININE 2.37* 2.00* 1.72*  CALCIUM  8.2* 7.9* 7.8*   LFTs No results for input(s): BILITOT, BILIDIR, IBILI, ALKPHOS, AST, ALT, PROT, ALBUMIN  in the last 72 hours. No results for input(s): LIPASE in the last 72 hours. PT/INR Recent Labs    07/29/24 1652  LABPROT 14.1  INR 1.0         Imaging Studies: CT HEAD WO CONTRAST ( ) Result Date: 07/29/2024 EXAM: CT HEAD WITHOUT CONTRAST 07/29/2024 02:48:59 PM TECHNIQUE: CT of the head was performed without the administration of intravenous contrast. Automated exposure control, iterative reconstruction, and/or weight based adjustment of the mA/kV was utilized to reduce the radiation dose to as low as reasonably achievable. COMPARISON: Head CT 03/23/2023. Brain MRI 07/07/2021. CLINICAL HISTORY: 88 year old female with altered mental status. FINDINGS: BRAIN AND VENTRICLES: Brain volume remains normal for age. No acute hemorrhage. No evidence of acute infarct. No hydrocephalus. No extra-axial collection. No mass effect or midline shift. Gray white differentiation is stable and largely normal for age. Minimal to mild chronic white matter changes. Calcified atherosclerosis at the skull base. No suspicious intracranial vascular hyperdensity. ORBITS: No acute abnormality. 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 Chronic small volume fluid or mucosal thickening in the left sphenoid sinus is stable since last year. SOFT TISSUES AND SKULL: No acute soft tissue abnormality. No skull fracture. Tympanic  cavities remain well aerated. Improved left mastoid aeration since last year, mild mastoid effusions now, significance doubtful. IMPRESSION: 1. No acute intracranial abnormality. Negative for age non-contrast CT appearance of the brain. Electronically signed by: Helayne Hurst MD 07/29/2024 05:21 PM EST RP  Workstation: [id number]  [7 weeks]  Assessment:   [name] is an 88 y.o. year old female  with a history of CKD stage IV, anemia multifactorial with chronic disease and functional iron deficiency , diabetes, HTN, HLD, central cord syndrome at C4 with quadriplegia secondary to fall in May 2022, DM, cognitively impaired, chronic constipation with retained stool on CTs and fecal impaction in past, left-sided abdominal pain, mild diverticulitis June 2024,  chronic hematuria with indwelling foley, unstageable sacral ulcer, presenting this admission from [facility] nursing center after found to have Hgb 6.9. GI now consulted. Notably, she has also had a steady decline over the past year, increasing somnolence for the past month. GI now consulted.    Acute on chronic anemia: heme negative result in epic on admission, but per ED notes was heme positive on exam in ED with black stool.  -Hgb 6.7 on admission with improvement to 8.7 s/p 1 unit PRBCs. Hgb 7.8 yesterday and 7.4 today.  -nursing reports dark stool/black stool yesterday after disimpaction and enemas. Per Therisa Stager, NP on DRE, stool dark green. Today, large brown stool.  -Many sources for ongoing blood loss including chronic hematuria, sacral decubitus unstageable, suspect UGI source could be contributing as well, unable to exclude occult colonic etiology or small bowel etiology such as AVMs.  -family/patient decline EGD/colonoscopy.    Constipation with fecal impaction: worsened in setting of paralysis. Needs aggressive bowel regimen.  -DRE by GI NP completed and has large, soft stool ball in place.  -Starting enemas yesterday. -Increased Linzess  to 290mcg daily -Miralax  17g BID. -receiving bisacodyl  10mg  suppository at bedtime -Continue with electrolyte management -Check TSH.     Failure to thrive: appreciate palliative consultation       Plan:   Hold off on endoscopic procedures at family's/patient request. Continue with aggressive  bowel regimen. Continue to monitor for decline in anemia or overt GI bleeding Continue PPI BID. TSH.  Will sign off for now. Please call with any questions or change in status.    LOS: 2 days   Sonny RAMAN. Ezz[facility] [phone] 12/2/20252:09 PM

## 2024-08-01 NOTE — Consult Note (Signed)
 Urology Consult  Referring physician: Dr. Evonnie Reason for referral: gross hematuria  Chief Complaint: gross hematuria  History of Present Illness: Ms Arriola is a 88yo with a history of neurogenic bladder managed with indwelling foley who was admitted with anemia. She has a hx of SCI in 2022 and has been managed with a chronic indwelling foley. For the past 3 months shw has had intermittent gross hematuria. She has had multiple positive urine cultures and even with treatment the hematuria does not resolve. She has no feeling in her pelvic due to SCI. No issues with malignant hypertension. She is currently on rocephin  for her recent positive urine culture. Urine is currently dark red. No fevers. She is a poor historian due to cognitive impairment. Ct from 10/2023 showed no obvious GU tumors and no calculi.   Past Medical History:  Diagnosis Date   Acute cystitis without hematuria    Adult failure to thrive    Anemia    Anxiety    Atherosclerosis of aorta    Central cord syndrome at C4 level of cervical spinal cord, subsequent encounter (HCC)    Chronic kidney disease, stage IV (severe) (HCC)    CKD (chronic kidney disease)    stage 3   Depression    DM type 2 with diabetic peripheral neuropathy (HCC)    Dysphagia    GERD (gastroesophageal reflux disease)    Gout    High cholesterol    HTN (hypertension)    Hyponatremia    MDD (major depressive disorder)    Neck pain    Neuropathy    Quadriplegia, C1-C4 incomplete (HCC)    Radiculopathy    RLS (restless legs syndrome)    Unspecified convulsions (HCC)    Urinary retention    Past Surgical History:  Procedure Laterality Date   ABDOMINAL HYSTERECTOMY     ANTERIOR CERVICAL DECOMP/DISCECTOMY FUSION N/A 02/05/2021   Procedure: Cervical Three-Four  Anterior cervical decompression/discectomy/fusion;  Surgeon: Gillie Duncans, MD;  Location: [facility];  Service: Neurosurgery;  Laterality: N/A;  RM 20   APPENDECTOMY     BACK SURGERY     BALLOON  DILATION N/A 11/19/2022   Procedure: BALLOON DILATION;  Surgeon: Cindie Carlin POUR, DO;  Location: [facility];  Service: Endoscopy;  Laterality: N/A;   BIOPSY  09/22/2021   Procedure: BIOPSY;  Surgeon: Cindie Carlin POUR, DO;  [facility];  Service: Endoscopy;;   BIOPSY  11/19/2022   Procedure: BIOPSY;  Surgeon: Cindie Carlin POUR, DO;  [facility];  Service: Endoscopy;;   CERVICAL DISC SURGERY     ESOPHAGOGASTRODUODENOSCOPY (EGD) WITH PROPOFOL  N/A 09/22/2021   Procedure: ESOPHAGOGASTRODUODENOSCOPY (EGD) WITH PROPOFOL ;  Surgeon: Cindie Carlin POUR, DO;  [facility];  Service: Endoscopy;  Laterality: N/A;  1:00pm   ESOPHAGOGASTRODUODENOSCOPY (EGD) WITH PROPOFOL  N/A 11/19/2022   Procedure: ESOPHAGOGASTRODUODENOSCOPY (EGD) WITH PROPOFOL ;  Surgeon: Cindie Carlin POUR, DO;  [facility];  Service: Endoscopy;  Laterality: N/A;  11:30 am, asa 3   FLEXIBLE SIGMOIDOSCOPY  09/20/2023   Procedure: FLEXIBLE SIGMOIDOSCOPY;  Surgeon: Cindie Carlin POUR, DO;  [facility];  Service: Endoscopy;;   HAND SURGERY     KNEE SURGERY      Medications: I have reviewed the patient's current medications. Allergies:  Allergies  Allergen Reactions   Ace Inhibitors Other (See Comments)    Hyperkalemia    Codeine Other (See Comments)    Unknown    Sulfa Antibiotics Other (See Comments)  Unknown     Family History  Problem Relation Age of Onset   Cancer Mother    Cardiomyopathy Father    Seizures Sister        childhood   Seizures Brother        in his 22s   Colon cancer Neg Hx    Social History:  reports that she has never smoked. She has never used smokeless tobacco. She reports that she does not drink alcohol and does not use drugs.  Review of Systems  Unable to perform ROS: Dementia  Genitourinary:  Positive for hematuria.    Physical Exam:  Vital signs in last 24 hours: Temp:  [97.5 F (36.4 C)-98.1 F (36.7 C)] 97.6 F (36.4 C) (12/02  0746) Pulse Rate:  [63-83] 69 (12/02 0800) Resp:  [12-25] 15 (12/02 0800) BP: (101-152)/(31-90) 152/44 (12/02 0800) SpO2:  [97 %-100 %] 100 % (12/02 0800) Physical Exam Constitutional:      Appearance: Normal appearance.  HENT:     Head: Normocephalic and atraumatic.     Nose: Nose normal.     Mouth/Throat:     Mouth: Mucous membranes are dry.  Eyes:     Extraocular Movements: Extraocular movements intact.     Pupils: Pupils are equal, round, and reactive to light.  Cardiovascular:     Rate and Rhythm: Normal rate and regular rhythm.  Pulmonary:     Effort: Pulmonary effort is normal. No respiratory distress.  Abdominal:     General: Abdomen is flat. There is no distension.  Musculoskeletal:     Cervical back: Normal range of motion and neck supple.  Skin:    General: Skin is warm and dry.  Neurological:     Mental Status: She is alert. Mental status is at baseline.     Laboratory Data:  Results for orders placed or performed during the hospital encounter of 07/29/24 (from the past 72 hours)  Comprehensive metabolic panel     Status: Abnormal   Collection Time: 07/29/24  9:50 AM  Result Value Ref Range   Sodium 138 135 - 145 mmol/L   Potassium 5.8 (H) 3.5 - 5.1 mmol/L   Chloride 101 98 - 111 mmol/L   CO2 25 22 - 32 mmol/L   Glucose, Bld 175 (H) 70 - 99 mg/dL    Comment: Glucose reference range applies only to samples taken after fasting for at least 8 hours.   BUN 93 (H) 8 - 23 mg/dL   Creatinine, Ser 7.27 (H) 0.44 - 1.00 mg/dL   Calcium  8.7 (L) 8.9 - 10.3 mg/dL   Total Protein 6.5 6.5 - 8.1 g/dL   Albumin  3.2 (L) 3.5 - 5.0 g/dL   AST 30 15 - 41 U/L   ALT 18 0 - 44 U/L   Alkaline Phosphatase 81 38 - 126 U/L   Total Bilirubin <0.2 0.0 - 1.2 mg/dL   GFR, Estimated 17 (L) >60 mL/min    Comment: (NOTE) Calculated using the CKD-EPI Creatinine Equation (2021)    Anion gap 12 5 - 15    Comment: Performed at [facility], [address], [place], [place] [address]  CBC      Status: Abnormal   Collection Time: 07/29/24  9:50 AM  Result Value Ref Range   WBC 8.4 4.0 - 10.5 K/uL   RBC 2.12 (L) 3.87 - 5.11 MIL/uL   Hemoglobin 6.7 (LL) 12.0 - 15.0 g/dL    Comment: REPEATED TO VERIFY This critical result has been  called to A. COVELL by Ulla Echevaria on 07/29/2024 10:18:56, and has been read back.    HCT 21.4 (L) 36.0 - 46.0 %   MCV 100.9 (H) 80.0 - 100.0 fL   MCH 31.6 26.0 - 34.0 pg   MCHC 31.3 30.0 - 36.0 g/dL   RDW 85.3 88.4 - 84.4 %   Platelets 323 150 - 400 K/uL   nRBC 0.0 0.0 - 0.2 %    Comment: Performed at [facility], [address], [place], [place] [address]  Type and screen [facility]     Status: None   Collection Time: 07/29/24  9:59 AM  Result Value Ref Range   ABO/RH(D) B POS    Antibody Screen NEG    Sample Expiration 08/01/2024,2359    Unit Number [id number]    Blood Component Type RED CELLS,LR    Unit division 00    Status of Unit ISSUED,FINAL    Transfusion Status OK TO TRANSFUSE    Crossmatch Result      Compatible Performed at [facility], [address], [place], [place] [address]   Urinalysis, Routine w reflex microscopic -Urine, Catheterized; Indwelling urinary catheter     Status: Abnormal   Collection Time: 07/29/24 10:26 AM  Result Value Ref Range   Color, Urine RED (A) YELLOW    Comment: BIOCHEMICALS MAY BE AFFECTED BY COLOR   APPearance TURBID (A) CLEAR   Specific Gravity, Urine  1.005 - 1.030    TEST NOT REPORTED DUE TO COLOR INTERFERENCE OF URINE PIGMENT   pH  5.0 - 8.0    TEST NOT REPORTED DUE TO COLOR INTERFERENCE OF URINE PIGMENT   Glucose, UA (A) NEGATIVE mg/dL    TEST NOT REPORTED DUE TO COLOR INTERFERENCE OF URINE PIGMENT   Hgb urine dipstick (A) NEGATIVE    TEST NOT REPORTED DUE TO COLOR INTERFERENCE OF URINE PIGMENT   Bilirubin Urine (A) NEGATIVE    TEST NOT REPORTED DUE TO COLOR INTERFERENCE OF URINE PIGMENT   Ketones, ur (A) NEGATIVE mg/dL    TEST NOT REPORTED DUE TO COLOR INTERFERENCE OF  URINE PIGMENT   Protein, ur (A) NEGATIVE mg/dL    TEST NOT REPORTED DUE TO COLOR INTERFERENCE OF URINE PIGMENT   Nitrite (A) NEGATIVE    TEST NOT REPORTED DUE TO COLOR INTERFERENCE OF URINE PIGMENT   Leukocytes,Ua (A) NEGATIVE    TEST NOT REPORTED DUE TO COLOR INTERFERENCE OF URINE PIGMENT    Comment: Performed at [facility], [address], [place], [place] [address]  Urinalysis, Microscopic (reflex)     Status: Abnormal   Collection Time: 07/29/24 10:26 AM  Result Value Ref Range   RBC / HPF >50 0 - 5 RBC/hpf   WBC, UA >50 0 - 5 WBC/hpf   Bacteria, UA MANY (A) NONE SEEN   Squamous Epithelial / HPF 0-5 0 - 5 /HPF   Mucus PRESENT    Granular Casts, UA PRESENT    WBC Casts, UA PRESENT     Comment: Performed at [facility], [address], [place], [place] [address]  Culture, Urine (Do not remove urinary catheter, catheter placed by urology or difficult to place)     Status: Abnormal   Collection Time: 07/29/24 10:26 AM   Specimen: Urine, Catheterized  Result Value Ref Range   Specimen Description      URINE, CATHETERIZED Performed at [facility], [address], [place], [place] [address]    Special Requests  NONE Performed at [facility], [address], [place], [place] [address]    Culture (A)     >=100,000 COLONIES/mL ESCHERICHIA COLI >=100,000 COLONIES/mL PROVIDENCIA STUARTII SUSCEPTIBILITIES PERFORMED ON PREVIOUS CULTURE WITHIN THE LAST 5 DAYS. Performed at [facility], [address] [place], [place] [address]    Report Status 07/31/2024 FINAL   Prepare RBC (crossmatch)     Status: None   Collection Time: 07/29/24 10:49 AM  Result Value Ref Range   Order Confirmation      ORDER PROCESSED BY BLOOD BANK Performed at [facility], [address] [place], [place] [address]   POC occult blood, ED Provider will collect     Status: None   Collection Time: 07/29/24 11:04 AM  Result Value Ref Range   Negative    CBG monitoring, ED     Status: Abnormal    Collection Time: 07/29/24 11:41 AM  Result Value Ref Range   Glucose-Capillary 189 (H) 70 - 99 mg/dL    Comment: Glucose reference range applies only to samples taken after fasting for at least 8 hours.  Glucose, capillary     Status: Abnormal   Collection Time: 07/29/24  2:14 PM  Result Value Ref Range   Glucose-Capillary 148 (H) 70 - 99 mg/dL    Comment: Glucose reference range applies only to samples taken after fasting for at least 8 hours.  Blood gas, arterial     Status: None   Collection Time: 07/29/24  2:28 PM  Result Value Ref Range   pH, Arterial 7.44 7.35 - 7.45   pCO2 arterial 38 32 - 48 mmHg   pO2, Arterial 85 83 - 108 mmHg   Bicarbonate 25.9 20.0 - 28.0 mmol/L   Acid-Base Excess 1.5 0.0 - 2.0 mmol/L   O2 Saturation 100 %   Patient temperature 36.4    Collection site RIGHT RADIAL    Drawn by [id number]    Allens test (pass/fail) PASS PASS    Comment: Performed at [facility], [address], [place], [place] [address]  MRSA Next Gen by PCR, Nasal     Status: None   Collection Time: 07/29/24  3:00 PM   Specimen: Nasal Mucosa; Nasal Swab  Result Value Ref Range   MRSA by PCR Next Gen NOT DETECTED NOT DETECTED    Comment: (NOTE) The GeneXpert MRSA Assay (FDA approved for NASAL specimens only), is one component of a comprehensive MRSA colonization surveillance program. It is not intended to diagnose MRSA infection nor to guide or monitor treatment for MRSA infections. Test performance is not FDA approved in patients less than 55 years old. Performed at [facility], [address], [place], [place] [address]   Glucose, capillary     Status: Abnormal   Collection Time: 07/29/24  4:14 PM  Result Value Ref Range   Glucose-Capillary 142 (H) 70 - 99 mg/dL    Comment: Glucose reference range applies only to samples taken after fasting for at least 8 hours.  Ammonia     Status: None   Collection Time: 07/29/24  4:52 PM  Result Value Ref Range   Ammonia 27 9 - 35 umol/L     Comment: Performed at [facility], [address] [place], [place] [address]  Vitamin B12     Status: Abnormal   Collection Time: 07/29/24  4:52 PM  Result Value Ref Range   Vitamin B-12 1,296 (H) 180 - 914 pg/mL    Comment: Performed at [facility],  [address] [place], [place] [address]  Protime-INR     Status: None   Collection Time: 07/29/24  4:52 PM  Result Value Ref Range   Prothrombin Time 14.1 11.4 - 15.2 seconds   INR 1.0 0.8 - 1.2    Comment: (NOTE) INR goal varies based on device and disease states. Performed at [facility], [address] [place], [place] [address]   Magnesium      Status: Abnormal   Collection Time: 07/29/24  4:52 PM  Result Value Ref Range   Magnesium  3.8 (H) 1.7 - 2.4 mg/dL    Comment: Performed at [facility], [address], [place], [place] [address]  Iron and TIBC     Status: Abnormal   Collection Time: 07/29/24  4:52 PM  Result Value Ref Range   Iron 32 28 - 170 ug/dL   TIBC 797 (L) 749 - 549 ug/dL   Saturation Ratios 16 10.4 - 31.8 %   UIBC 170 ug/dL    Comment: Performed at [facility], [address], [place], [place] [address]  Folate     Status: None   Collection Time: 07/29/24  4:52 PM  Result Value Ref Range   Folate >20.0 >5.9 ng/mL    Comment: Performed at [facility], [address], [place], [place] [address]  Ferritin     Status: Abnormal   Collection Time: 07/29/24  4:52 PM  Result Value Ref Range   Ferritin 1,276 (H) 11 - 307 ng/mL    Comment: Performed at [facility], [address] [place], [place] [address]  Glucose, capillary     Status: Abnormal   Collection Time: 07/29/24  8:59 PM  Result Value Ref Range   Glucose-Capillary 136 (H) 70 - 99 mg/dL    Comment: Glucose reference range applies only to samples taken after fasting for at least 8 hours.  Hemoglobin and hematocrit, blood     Status: Abnormal   Collection Time: 07/29/24  9:00 PM  Result Value Ref Range   Hemoglobin 8.7 (L) 12.0 - 15.0 g/dL    Comment:  REPEATED TO VERIFY POST TRANSFUSION SPECIMEN    HCT 27.0 (L) 36.0 - 46.0 %    Comment: Performed at [facility], [address] [place], [place] [address]  Basic metabolic panel     Status: Abnormal   Collection Time: 07/30/24  3:33 AM  Result Value Ref Range   Sodium 140 135 - 145 mmol/L   Potassium 4.4 3.5 - 5.1 mmol/L    Comment: Delta check noted    Chloride 104 98 - 111 mmol/L   CO2 21 (L) 22 - 32 mmol/L   Glucose, Bld 112 (H) 70 - 99 mg/dL    Comment: Glucose reference range applies only to samples taken after fasting for at least 8 hours.   BUN 88 (H) 8 - 23 mg/dL   Creatinine, Ser 7.62 (H) 0.44 - 1.00 mg/dL   Calcium  8.2 (L) 8.9 - 10.3 mg/dL   GFR, Estimated 20 (L) >60 mL/min    Comment: (NOTE) Calculated using the CKD-EPI Creatinine Equation (2021)    Anion gap 15 5 - 15    Comment: Performed at [facility], [address], [place], [place] [address]  CBC     Status: Abnormal   Collection Time: 07/30/24  3:33 AM  Result Value Ref Range   WBC 6.8 4.0 - 10.5 K/uL   RBC 2.56 (L) 3.87 - 5.11 MIL/uL   Hemoglobin 8.2 (L) 12.0 - 15.0  g/dL   HCT 74.5 (L) 63.9 - 53.9 %   MCV 99.2 80.0 - 100.0 fL   MCH 32.0 26.0 - 34.0 pg   MCHC 32.3 30.0 - 36.0 g/dL   RDW 84.8 88.4 - 84.4 %   Platelets 258 150 - 400 K/uL   nRBC 0.0 0.0 - 0.2 %    Comment: Performed at [facility], [address], [place], [place] [address]  Magnesium      Status: Abnormal   Collection Time: 07/30/24  3:33 AM  Result Value Ref Range   Magnesium  3.5 (H) 1.7 - 2.4 mg/dL    Comment: Performed at [facility], [address], [place], [place] [address]  Glucose, capillary     Status: Abnormal   Collection Time: 07/30/24  7:20 AM  Result Value Ref Range   Glucose-Capillary 108 (H) 70 - 99 mg/dL    Comment: Glucose reference range applies only to samples taken after fasting for at least 8 hours.  Glucose, capillary     Status: Abnormal   Collection Time: 07/30/24  8:06 AM  Result Value Ref Range    Glucose-Capillary 103 (H) 70 - 99 mg/dL    Comment: Glucose reference range applies only to samples taken after fasting for at least 8 hours.  Glucose, capillary     Status: Abnormal   Collection Time: 07/30/24 11:14 AM  Result Value Ref Range   Glucose-Capillary 104 (H) 70 - 99 mg/dL    Comment: Glucose reference range applies only to samples taken after fasting for at least 8 hours.  Glucose, capillary     Status: Abnormal   Collection Time: 07/30/24  4:20 PM  Result Value Ref Range   Glucose-Capillary 170 (H) 70 - 99 mg/dL    Comment: Glucose reference range applies only to samples taken after fasting for at least 8 hours.  Glucose, capillary     Status: Abnormal   Collection Time: 07/30/24  9:01 PM  Result Value Ref Range   Glucose-Capillary 149 (H) 70 - 99 mg/dL    Comment: Glucose reference range applies only to samples taken after fasting for at least 8 hours.  CBC     Status: Abnormal   Collection Time: 07/31/24  3:51 AM  Result Value Ref Range   WBC 6.3 4.0 - 10.5 K/uL   RBC 2.41 (L) 3.87 - 5.11 MIL/uL   Hemoglobin 7.8 (L) 12.0 - 15.0 g/dL   HCT 75.8 (L) 63.9 - 53.9 %   MCV 100.0 80.0 - 100.0 fL   MCH 32.4 26.0 - 34.0 pg   MCHC 32.4 30.0 - 36.0 g/dL   RDW 84.9 88.4 - 84.4 %   Platelets 257 150 - 400 K/uL   nRBC 0.0 0.0 - 0.2 %    Comment: Performed at [facility], [address] [place], [place] [address]  Basic metabolic panel with GFR     Status: Abnormal   Collection Time: 07/31/24  3:51 AM  Result Value Ref Range   Sodium 140 135 - 145 mmol/L   Potassium 4.3 3.5 - 5.1 mmol/L   Chloride 106 98 - 111 mmol/L   CO2 23 22 - 32 mmol/L   Glucose, Bld 141 (H) 70 - 99 mg/dL    Comment: Glucose reference range applies only to samples taken after fasting for at least 8 hours.   BUN 78 (H) 8 - 23 mg/dL   Creatinine, Ser 7.99 (H) 0.44 - 1.00 mg/dL   Calcium  7.9 (L) 8.9 - 10.3  mg/dL   GFR, Estimated 24 (L) >60 mL/min    Comment: (NOTE) Calculated using the CKD-EPI  Creatinine Equation (2021)    Anion gap 11 5 - 15    Comment: Performed at [facility], [address], [place], [place] [address]  Magnesium      Status: Abnormal   Collection Time: 07/31/24  3:51 AM  Result Value Ref Range   Magnesium  3.4 (H) 1.7 - 2.4 mg/dL    Comment: Performed at [facility], [address], [place], [place] [address]  Glucose, capillary     Status: Abnormal   Collection Time: 07/31/24  7:58 AM  Result Value Ref Range   Glucose-Capillary 133 (H) 70 - 99 mg/dL    Comment: Glucose reference range applies only to samples taken after fasting for at least 8 hours.  Glucose, capillary     Status: Abnormal   Collection Time: 07/31/24  9:58 AM  Result Value Ref Range   Glucose-Capillary 114 (H) 70 - 99 mg/dL    Comment: Glucose reference range applies only to samples taken after fasting for at least 8 hours.  Glucose, capillary     Status: Abnormal   Collection Time: 07/31/24 11:54 AM  Result Value Ref Range   Glucose-Capillary 102 (H) 70 - 99 mg/dL    Comment: Glucose reference range applies only to samples taken after fasting for at least 8 hours.   Comment 1 Notify RN    Comment 2 Document in Chart   Glucose, capillary     Status: Abnormal   Collection Time: 07/31/24  4:37 PM  Result Value Ref Range   Glucose-Capillary 131 (H) 70 - 99 mg/dL    Comment: Glucose reference range applies only to samples taken after fasting for at least 8 hours.   Comment 1 Notify RN    Comment 2 Document in Chart   Glucose, capillary     Status: None   Collection Time: 07/31/24  9:35 PM  Result Value Ref Range   Glucose-Capillary 86 70 - 99 mg/dL    Comment: Glucose reference range applies only to samples taken after fasting for at least 8 hours.   Comment 1 Notify RN    Comment 2 Document in Chart   Basic metabolic panel with GFR     Status: Abnormal   Collection Time: 08/01/24  5:27 AM  Result Value Ref Range   Sodium 140 135 - 145 mmol/L   Potassium 4.2 3.5 - 5.1 mmol/L    Chloride 106 98 - 111 mmol/L   CO2 22 22 - 32 mmol/L   Glucose, Bld 98 70 - 99 mg/dL    Comment: Glucose reference range applies only to samples taken after fasting for at least 8 hours.   BUN 56 (H) 8 - 23 mg/dL   Creatinine, Ser 8.27 (H) 0.44 - 1.00 mg/dL   Calcium  7.8 (L) 8.9 - 10.3 mg/dL   GFR, Estimated 29 (L) >60 mL/min    Comment: (NOTE) Calculated using the CKD-EPI Creatinine Equation (2021)    Anion gap 11 5 - 15    Comment: Performed at [facility], [address], [place], [place] [address]  CBC     Status: Abnormal   Collection Time: 08/01/24  5:27 AM  Result Value Ref Range   WBC 5.0 4.0 - 10.5 K/uL   RBC 2.29 (L) 3.87 - 5.11 MIL/uL   Hemoglobin 7.4 (L) 12.0 - 15.0 g/dL   HCT 76.6 (L) 63.9 - 53.9 %  MCV 101.7 (H) 80.0 - 100.0 fL   MCH 32.3 26.0 - 34.0 pg   MCHC 31.8 30.0 - 36.0 g/dL   RDW 85.1 88.4 - 84.4 %   Platelets 237 150 - 400 K/uL   nRBC 0.0 0.0 - 0.2 %    Comment: Performed at [facility], [address], [place], [place] [address]  Magnesium      Status: Abnormal   Collection Time: 08/01/24  5:27 AM  Result Value Ref Range   Magnesium  3.3 (H) 1.7 - 2.4 mg/dL    Comment: Performed at [facility], [address], [place], [place] [address]  Glucose, capillary     Status: Abnormal   Collection Time: 08/01/24  7:44 AM  Result Value Ref Range   Glucose-Capillary 105 (H) 70 - 99 mg/dL    Comment: Glucose reference range applies only to samples taken after fasting for at least 8 hours.   Recent Results (from the past 240 hours)  Culture, Urine (Do not remove urinary catheter, catheter placed by urology or difficult to place)     Status: Abnormal   Collection Time: 07/28/24  2:21 PM   Specimen: Urine, Catheterized  Result Value Ref Range Status   Specimen Description   Final    URINE, CATHETERIZED Performed at [facility], [address] [place], [place] [address]    Special Requests   Final    NONE Performed at [facility], [address]  [place], [place] [address]    Culture (A)  Final    >=100,000 COLONIES/mL ESCHERICHIA COLI >=100,000 COLONIES/mL PROVIDENCIA STUARTII    Report Status 07/31/2024 FINAL  Final   Organism ID, Bacteria ESCHERICHIA COLI (A)  Final   Organism ID, Bacteria PROVIDENCIA STUARTII (A)  Final      Susceptibility   Escherichia coli - MIC*    AMPICILLIN 8 SENSITIVE Sensitive     CEFAZOLIN  (URINE) Value in next row Sensitive      2 SENSITIVEThis is a modified FDA-approved test that has been validated and its performance characteristics determined by the reporting laboratory.  This laboratory is certified under the Clinical Laboratory Improvement Amendments CLIA as qualified to perform high complexity clinical laboratory testing.    CEFEPIME Value in next row Sensitive      2 SENSITIVEThis is a modified FDA-approved test that has been validated and its performance characteristics determined by the reporting laboratory.  This laboratory is certified under the Clinical Laboratory Improvement Amendments CLIA as qualified to perform high complexity clinical laboratory testing.    ERTAPENEM Value in next row Sensitive      2 SENSITIVEThis is a modified FDA-approved test that has been validated and its performance characteristics determined by the reporting laboratory.  This laboratory is certified under the Clinical Laboratory Improvement Amendments CLIA as qualified to perform high complexity clinical laboratory testing.    CEFTRIAXONE  Value in next row Sensitive      2 SENSITIVEThis is a modified FDA-approved test that has been validated and its performance characteristics determined by the reporting laboratory.  This laboratory is certified under the Clinical Laboratory Improvement Amendments CLIA as qualified to perform high complexity clinical laboratory testing.    CIPROFLOXACIN Value in next row Intermediate      2 SENSITIVEThis is a modified FDA-approved test that has been validated and its performance  characteristics determined by the reporting laboratory.  This laboratory is certified under the Clinical Laboratory Improvement Amendments CLIA as qualified to perform high complexity clinical laboratory testing.  GENTAMICIN Value in next row Sensitive      2 SENSITIVEThis is a modified FDA-approved test that has been validated and its performance characteristics determined by the reporting laboratory.  This laboratory is certified under the Clinical Laboratory Improvement Amendments CLIA as qualified to perform high complexity clinical laboratory testing.    NITROFURANTOIN Value in next row Sensitive      2 SENSITIVEThis is a modified FDA-approved test that has been validated and its performance characteristics determined by the reporting laboratory.  This laboratory is certified under the Clinical Laboratory Improvement Amendments CLIA as qualified to perform high complexity clinical laboratory testing.    TRIMETH/SULFA Value in next row Sensitive      2 SENSITIVEThis is a modified FDA-approved test that has been validated and its performance characteristics determined by the reporting laboratory.  This laboratory is certified under the Clinical Laboratory Improvement Amendments CLIA as qualified to perform high complexity clinical laboratory testing.    AMPICILLIN/SULBACTAM Value in next row Sensitive      2 SENSITIVEThis is a modified FDA-approved test that has been validated and its performance characteristics determined by the reporting laboratory.  This laboratory is certified under the Clinical Laboratory Improvement Amendments CLIA as qualified to perform high complexity clinical laboratory testing.    PIP/TAZO Value in next row Sensitive      <=4 SENSITIVEThis is a modified FDA-approved test that has been validated and its performance characteristics determined by the reporting laboratory.  This laboratory is certified under the Clinical Laboratory Improvement Amendments CLIA as qualified to  perform high complexity clinical laboratory testing.    MEROPENEM Value in next row Sensitive      <=4 SENSITIVEThis is a modified FDA-approved test that has been validated and its performance characteristics determined by the reporting laboratory.  This laboratory is certified under the Clinical Laboratory Improvement Amendments CLIA as qualified to perform high complexity clinical laboratory testing.    * >=100,000 COLONIES/mL ESCHERICHIA COLI   Providencia stuartii - MIC*    AMPICILLIN Value in next row Resistant      <=4 SENSITIVEThis is a modified FDA-approved test that has been validated and its performance characteristics determined by the reporting laboratory.  This laboratory is certified under the Clinical Laboratory Improvement Amendments CLIA as qualified to perform high complexity clinical laboratory testing.    CEFEPIME Value in next row Sensitive      <=4 SENSITIVEThis is a modified FDA-approved test that has been validated and its performance characteristics determined by the reporting laboratory.  This laboratory is certified under the Clinical Laboratory Improvement Amendments CLIA as qualified to perform high complexity clinical laboratory testing.    ERTAPENEM Value in next row Sensitive      <=4 SENSITIVEThis is a modified FDA-approved test that has been validated and its performance characteristics determined by the reporting laboratory.  This laboratory is certified under the Clinical Laboratory Improvement Amendments CLIA as qualified to perform high complexity clinical laboratory testing.    CEFTRIAXONE  Value in next row Sensitive      <=4 SENSITIVEThis is a modified FDA-approved test that has been validated and its performance characteristics determined by the reporting laboratory.  This laboratory is certified under the Clinical Laboratory Improvement Amendments CLIA as qualified to perform high complexity clinical laboratory testing.    CIPROFLOXACIN Value in next row  Resistant      <=4 SENSITIVEThis is a modified FDA-approved test that has been validated and its performance characteristics determined by the reporting laboratory.  This laboratory  is certified under the Clinical Laboratory Improvement Amendments CLIA as qualified to perform high complexity clinical laboratory testing.    GENTAMICIN Value in next row Resistant      <=4 SENSITIVEThis is a modified FDA-approved test that has been validated and its performance characteristics determined by the reporting laboratory.  This laboratory is certified under the Clinical Laboratory Improvement Amendments CLIA as qualified to perform high complexity clinical laboratory testing.    NITROFURANTOIN Value in next row Resistant      <=4 SENSITIVEThis is a modified FDA-approved test that has been validated and its performance characteristics determined by the reporting laboratory.  This laboratory is certified under the Clinical Laboratory Improvement Amendments CLIA as qualified to perform high complexity clinical laboratory testing.    TRIMETH/SULFA Value in next row Sensitive      <=4 SENSITIVEThis is a modified FDA-approved test that has been validated and its performance characteristics determined by the reporting laboratory.  This laboratory is certified under the Clinical Laboratory Improvement Amendments CLIA as qualified to perform high complexity clinical laboratory testing.    AMPICILLIN/SULBACTAM Value in next row Resistant      <=4 SENSITIVEThis is a modified FDA-approved test that has been validated and its performance characteristics determined by the reporting laboratory.  This laboratory is certified under the Clinical Laboratory Improvement Amendments CLIA as qualified to perform high complexity clinical laboratory testing.    PIP/TAZO Value in next row Sensitive      <=4 SENSITIVEThis is a modified FDA-approved test that has been validated and its performance characteristics determined by the reporting  laboratory.  This laboratory is certified under the Clinical Laboratory Improvement Amendments CLIA as qualified to perform high complexity clinical laboratory testing.    MEROPENEM Value in next row Sensitive      <=4 SENSITIVEThis is a modified FDA-approved test that has been validated and its performance characteristics determined by the reporting laboratory.  This laboratory is certified under the Clinical Laboratory Improvement Amendments CLIA as qualified to perform high complexity clinical laboratory testing.    * >=100,000 COLONIES/mL PROVIDENCIA STUARTII  Culture, Urine (Do not remove urinary catheter, catheter placed by urology or difficult to place)     Status: Abnormal   Collection Time: 07/29/24 10:26 AM   Specimen: Urine, Catheterized  Result Value Ref Range Status   Specimen Description   Final    URINE, CATHETERIZED Performed at [facility], [address], [place], [place] [address]    Special Requests   Final    NONE Performed at [facility], [address] [place], [place] [address]    Culture (A)  Final    >=100,000 COLONIES/mL ESCHERICHIA COLI >=100,000 COLONIES/mL PROVIDENCIA STUARTII SUSCEPTIBILITIES PERFORMED ON PREVIOUS CULTURE WITHIN THE LAST 5 DAYS. Performed at [facility], [address], [place], [place] [address]    Report Status 07/31/2024 FINAL  Final  MRSA Next Gen by PCR, Nasal     Status: None   Collection Time: 07/29/24  3:00 PM   Specimen: Nasal Mucosa; Nasal Swab  Result Value Ref Range Status   MRSA by PCR Next Gen NOT DETECTED NOT DETECTED Final    Comment: (NOTE) The GeneXpert MRSA Assay (FDA approved for NASAL specimens only), is one component of a comprehensive MRSA colonization surveillance program. It is not intended to diagnose MRSA infection nor to guide or monitor treatment for MRSA infections. Test performance is not FDA approved in patients less than 70 years old. Performed at [facility], [phone]  [address] [place],  [place] [address]    Creatinine: Recent Labs    07/29/24 0950 07/30/24 0333 07/31/24 0351 08/01/24 0527  CREATININE 2.72* 2.37* 2.00* 1.72*   Baseline Creatinine: 2  Impression/Assessment:  87yo with recurrent gross hematuria  Plan:  We discussed the natural history of gross hematuria and the various causes. We will obtain a CT stone study to assess for bladder/renal calculi. Urine cystology ordered due to concern for malignancy She will then be scheduled for outpatient cystoscopy to complete her hematuria workup.   Belvie Clara 08/01/2024, 8:47 AM

## 2024-08-01 NOTE — Progress Notes (Signed)
 PROGRESS NOTE  [name] FMW:[id number] DOB: 1936-08-25 DOA: 07/29/2024 PCP: [name], NP  Brief History:  88 year old female with a history of of C4 central cord syndrome with quadriplegia, diabetes mellitus type 2, hypertension, hyperlipidemia, CKD stage IV, MDD, [place], cognitive impairment, constipation, and neurogenic bladder with chronic Foley catheter presenting from [facility] center secondary to low hemoglobin on blood work with Hgb 6.9.  The patient is a poor historian secondary to her cognitive impairment.  History is supplemented by the patient's daughter and review of medical record.  Patient's daughter states that the patient has been a little bit more somnolent for the past month.  At the same time, she states that the patient continues to have is currently decline over the last 6 to 12 months.  Daughter is not aware of any hematochezia or melena.  The patient has not had any fevers, chills, chest pain, shortness breath, hemoptysis, abd pain, vomiting, diarrhea.  P.o. intake has been poor.  At baseline, the patient is essentially bedbound.  In the ED, the patient was afebrile and hemodynamically stable with oxygen saturation 98% on room air.  WBC 8.4, hemoglobin 6.7, platelets 323.  Sodium 138, potassium 4 point, bicarbonate 25, serum creatinine 2.73.  AST 30, ALT 18, capacity 0, total bilirubin <0.2.  EKG shows sinus rhythm nonspecific T wave changes. Hemoccult was positive.  GI was consulted to assist with management.    Assessment/Plan:  Acute Blood Loss Anemia/Heme Positive Stool -GI consulted -transfused 2 units PRBC total for hospitalization (11/29 and 12/2) - Patient had hemoglobin of 7.4 on 07/11/2024 and hemoglobin 8.5 on 06/19/2024 - Continue pantoprazole  twice daily - melena noted on dis-impaction 07/31/24 - family does not want EGD   Hematuria - Certainly this could be contributing to the patient's drop in hemoglobin - Review of the  medical record shows that the patient has had intermittent hematuria since 02/11/2022 - Send for urine culture--E coli and providencia - Patient has chronic indwelling Foley catheter - Foley catheter was exchanged on 07/28/2024--it appears to be draining properly - appreciate Dr. Sherrilee consult>>outpt cystoscopy - At the minimum, patient will need outpatient urology follow-up - Holding Farxiga   Colovesical Fistula -08/01/24 CT AP Interval 5.2 x 3.9 x 2.6 cm oval area of calcific, soft tissue and air density in the central urinary bladder. This is concerning for a possible colovesical fistula with associated barium and gas inthe bladder -based on GOC, family would not want any operative intervention -this explains the recurrent hematuria, dark/black urine, and frequent UTIs -consult general surgery, but probably nothing to do operatively  CAUTI -UA>50 WBC -11/28 and 11/29 urine culture = E coli & Providencia - hold Farxiga    Acute on chronic renal failure--CKD stage IV - Baseline creatinine 1.8-2.1 - Presented with serum creatinine 2.72 - Secondary to hemodynamic changes and volume depletion - Continue IV fluids>>improving   Acute Metabolic Encephalopathy -due to UTI and AKI -11/30--pt awake and alert.  Follows commands.  Speaks fluently, although pleasantly confused, knows she's in hospital -12/1 sister at beside states she is near baseline   Central cord syndrome at C4 level  C-spine cord compression/quadriplegia C1-4  -holding aspirin  -occurred 01/25/21   Hyperkalemia -given lokelma  -IV bicarb, IV calcium  gluconate given -started IV NS -place on tele - improved   Unstageable sacral decubitus ulcer - Wound care consult appreciated - present prior to admission - photographs taken   Diabetes mellitus type 2  with hyperglycemia - Holding Lantus  temporarily - Sliding scale - Holding Farxiga  - 06/19/2024 hemoglobin A1c 6.7   Essential hypertension - Holding  amlodipine initially secondary to soft blood pressure - monitor for now off meds   Seizure disorder - Continue Keppra  500 mg daily and Lamictal  150 mg twice daily   Hyperlipidemia - Continue statin   Chronic constipation - Continue MiraLAX  and senna     Neurogenic bladder - Patient has chronic indwelling Foley catheter PTA - Foley catheter was changed on 07/20/2024   Major neurocognitive disorder - Risk for hospital delirium - Holding Requip  for now as this can cause worsening mental status and delirium   Macrocytic anemia - 06/20/2024 B12 1090 - 06/20/2024 folic acid  >20   MDD - Continue duloxetine    GOC -palliative consulted.   -not ready for full comfort -does not want EGD -only agreeable to minimally invasive procedures           Family Communication:   grand daughter 12/2   Consultants:  GI   Code Status:  DNR   DVT Prophylaxis:  SCDs     Procedures: As Listed in Progress Note Above   Antibiotics: Ceftriaxone  11/29>>     Subjective: Patient denies fevers, chills, headache, chest pain, dyspnea, nausea, vomiting, diarrhea, abdominal pain, dysuria, hematuria, hematochezia, and melena.   Objective: Vitals:   08/01/24 1600 08/01/24 1640 08/01/24 1710 08/01/24 1800  BP: (!) 157/42  (!) 145/37 (!) 151/37  Pulse: 73  65 71  Resp: 14  14 14   Temp:  97.9 F (36.6 C)    TempSrc:  Oral    SpO2: 97%  100% 100%  Weight:      Height:        Intake/Output Summary (Last 24 hours) at 08/01/2024 1838 Last data filed at 08/01/2024 1524 Gross per 24 hour  Intake 1265.57 ml  Output 1350 ml  Net -84.43 ml   Weight change:  Exam:  General:  Pt is alert, follows commands appropriately, not in acute distress HEENT: No icterus, No thrush, No neck mass, [place]/AT Cardiovascular: RRR, S1/S2, no rubs, no gallops Respiratory: CTA bilaterally, no wheezing, no crackles, no rhonchi Abdomen: Soft/+BS, non tender, non distended, no guarding Extremities: No edema, No  lymphangitis, No petechiae, No rashes, no synovitis   Data Reviewed: I have personally reviewed following labs and imaging studies Basic Metabolic Panel: Recent Labs  Lab 07/29/24 0950 07/29/24 1652 07/30/24 0333 07/31/24 0351 08/01/24 0527  NA 138  --  140 140 140  K 5.8*  --  4.4 4.3 4.2  CL 101  --  104 106 106  CO2 25  --  21* 23 22  GLUCOSE 175*  --  112* 141* 98  BUN 93*  --  88* 78* 56*  CREATININE 2.72*  --  2.37* 2.00* 1.72*  CALCIUM  8.7*  --  8.2* 7.9* 7.8*  MG  --  3.8* 3.5* 3.4* 3.3*   Liver Function Tests: Recent Labs  Lab 07/29/24 0950  AST 30  ALT 18  ALKPHOS 81  BILITOT <0.2  PROT 6.5  ALBUMIN  3.2*   No results for input(s): LIPASE, AMYLASE in the last 168 hours. Recent Labs  Lab 07/29/24 1652  AMMONIA 27   Coagulation Profile: Recent Labs  Lab 07/29/24 1652  INR 1.0   CBC: Recent Labs  Lab 07/29/24 0835 07/29/24 0950 07/29/24 2100 07/30/24 0333 07/31/24 0351 08/01/24 0527 08/01/24 1559  WBC 8.7 8.4  --  6.8 6.3 5.0  --  NEUTROABS 5.9  --   --   --   --   --   --   HGB 6.9* 6.7* 8.7* 8.2* 7.8* 7.4* 8.9*  HCT 21.8* 21.4* 27.0* 25.4* 24.1* 23.3* 27.7*  MCV 100.9* 100.9*  --  99.2 100.0 101.7*  --   PLT 317 323  --  258 257 237  --    Cardiac Enzymes: No results for input(s): CKTOTAL, CKMB, CKMBINDEX, TROPONINI in the last 168 hours. BNP: Invalid input(s): POCBNP CBG: Recent Labs  Lab 07/31/24 1637 07/31/24 2135 08/01/24 0744 08/01/24 1229 08/01/24 1642  GLUCAP 131* 86 105* 114* 126*   HbA1C: No results for input(s): HGBA1C in the last 72 hours. Urine analysis:    Component Value Date/Time   COLORURINE RED (A) 07/29/2024 1026   APPEARANCEUR TURBID (A) 07/29/2024 1026   LABSPEC  07/29/2024 1026    TEST NOT REPORTED DUE TO COLOR INTERFERENCE OF URINE PIGMENT   PHURINE  07/29/2024 1026    TEST NOT REPORTED DUE TO COLOR INTERFERENCE OF URINE PIGMENT   GLUCOSEU (A) 07/29/2024 1026    TEST NOT REPORTED  DUE TO COLOR INTERFERENCE OF URINE PIGMENT   HGBUR (A) 07/29/2024 1026    TEST NOT REPORTED DUE TO COLOR INTERFERENCE OF URINE PIGMENT   BILIRUBINUR (A) 07/29/2024 1026    TEST NOT REPORTED DUE TO COLOR INTERFERENCE OF URINE PIGMENT   KETONESUR (A) 07/29/2024 1026    TEST NOT REPORTED DUE TO COLOR INTERFERENCE OF URINE PIGMENT   PROTEINUR (A) 07/29/2024 1026    TEST NOT REPORTED DUE TO COLOR INTERFERENCE OF URINE PIGMENT   NITRITE (A) 07/29/2024 1026    TEST NOT REPORTED DUE TO COLOR INTERFERENCE OF URINE PIGMENT   LEUKOCYTESUR (A) 07/29/2024 1026    TEST NOT REPORTED DUE TO COLOR INTERFERENCE OF URINE PIGMENT   Sepsis Labs: @LABRCNTIP (procalcitonin:4,lacticidven:4) ) Recent Results (from the past 240 hours)  Culture, Urine (Do not remove urinary catheter, catheter placed by urology or difficult to place)     Status: Abnormal   Collection Time: 07/28/24  2:21 PM   Specimen: Urine, Catheterized  Result Value Ref Range Status   Specimen Description   Final    URINE, CATHETERIZED Performed at [facility], [address] [place], [place] [address]    Special Requests   Final    NONE Performed at [facility], [address], [place], [place] [address]    Culture (A)  Final    >=100,000 COLONIES/mL ESCHERICHIA COLI >=100,000 COLONIES/mL PROVIDENCIA STUARTII    Report Status 07/31/2024 FINAL  Final   Organism ID, Bacteria ESCHERICHIA COLI (A)  Final   Organism ID, Bacteria PROVIDENCIA STUARTII (A)  Final      Susceptibility   Escherichia coli - MIC*    AMPICILLIN 8 SENSITIVE Sensitive     CEFAZOLIN  (URINE) Value in next row Sensitive      2 SENSITIVEThis is a modified FDA-approved test that has been validated and its performance characteristics determined by the reporting laboratory.  This laboratory is certified under the Clinical Laboratory Improvement Amendments CLIA as qualified to perform high complexity clinical laboratory testing.    CEFEPIME Value in next row Sensitive       2 SENSITIVEThis is a modified FDA-approved test that has been validated and its performance characteristics determined by the reporting laboratory.  This laboratory is certified under the Clinical Laboratory Improvement Amendments CLIA as qualified to perform high complexity clinical laboratory testing.    ERTAPENEM Value in next  row Sensitive      2 SENSITIVEThis is a modified FDA-approved test that has been validated and its performance characteristics determined by the reporting laboratory.  This laboratory is certified under the Clinical Laboratory Improvement Amendments CLIA as qualified to perform high complexity clinical laboratory testing.    CEFTRIAXONE  Value in next row Sensitive      2 SENSITIVEThis is a modified FDA-approved test that has been validated and its performance characteristics determined by the reporting laboratory.  This laboratory is certified under the Clinical Laboratory Improvement Amendments CLIA as qualified to perform high complexity clinical laboratory testing.    CIPROFLOXACIN Value in next row Intermediate      2 SENSITIVEThis is a modified FDA-approved test that has been validated and its performance characteristics determined by the reporting laboratory.  This laboratory is certified under the Clinical Laboratory Improvement Amendments CLIA as qualified to perform high complexity clinical laboratory testing.    GENTAMICIN Value in next row Sensitive      2 SENSITIVEThis is a modified FDA-approved test that has been validated and its performance characteristics determined by the reporting laboratory.  This laboratory is certified under the Clinical Laboratory Improvement Amendments CLIA as qualified to perform high complexity clinical laboratory testing.    NITROFURANTOIN Value in next row Sensitive      2 SENSITIVEThis is a modified FDA-approved test that has been validated and its performance characteristics determined by the reporting laboratory.  This laboratory is  certified under the Clinical Laboratory Improvement Amendments CLIA as qualified to perform high complexity clinical laboratory testing.    TRIMETH/SULFA Value in next row Sensitive      2 SENSITIVEThis is a modified FDA-approved test that has been validated and its performance characteristics determined by the reporting laboratory.  This laboratory is certified under the Clinical Laboratory Improvement Amendments CLIA as qualified to perform high complexity clinical laboratory testing.    AMPICILLIN/SULBACTAM Value in next row Sensitive      2 SENSITIVEThis is a modified FDA-approved test that has been validated and its performance characteristics determined by the reporting laboratory.  This laboratory is certified under the Clinical Laboratory Improvement Amendments CLIA as qualified to perform high complexity clinical laboratory testing.    PIP/TAZO Value in next row Sensitive      <=4 SENSITIVEThis is a modified FDA-approved test that has been validated and its performance characteristics determined by the reporting laboratory.  This laboratory is certified under the Clinical Laboratory Improvement Amendments CLIA as qualified to perform high complexity clinical laboratory testing.    MEROPENEM Value in next row Sensitive      <=4 SENSITIVEThis is a modified FDA-approved test that has been validated and its performance characteristics determined by the reporting laboratory.  This laboratory is certified under the Clinical Laboratory Improvement Amendments CLIA as qualified to perform high complexity clinical laboratory testing.    * >=100,000 COLONIES/mL ESCHERICHIA COLI   Providencia stuartii - MIC*    AMPICILLIN Value in next row Resistant      <=4 SENSITIVEThis is a modified FDA-approved test that has been validated and its performance characteristics determined by the reporting laboratory.  This laboratory is certified under the Clinical Laboratory Improvement Amendments CLIA as qualified to  perform high complexity clinical laboratory testing.    CEFEPIME Value in next row Sensitive      <=4 SENSITIVEThis is a modified FDA-approved test that has been validated and its performance characteristics determined by the reporting laboratory.  This laboratory is certified under the  Clinical Laboratory Improvement Amendments CLIA as qualified to perform high complexity clinical laboratory testing.    ERTAPENEM Value in next row Sensitive      <=4 SENSITIVEThis is a modified FDA-approved test that has been validated and its performance characteristics determined by the reporting laboratory.  This laboratory is certified under the Clinical Laboratory Improvement Amendments CLIA as qualified to perform high complexity clinical laboratory testing.    CEFTRIAXONE  Value in next row Sensitive      <=4 SENSITIVEThis is a modified FDA-approved test that has been validated and its performance characteristics determined by the reporting laboratory.  This laboratory is certified under the Clinical Laboratory Improvement Amendments CLIA as qualified to perform high complexity clinical laboratory testing.    CIPROFLOXACIN Value in next row Resistant      <=4 SENSITIVEThis is a modified FDA-approved test that has been validated and its performance characteristics determined by the reporting laboratory.  This laboratory is certified under the Clinical Laboratory Improvement Amendments CLIA as qualified to perform high complexity clinical laboratory testing.    GENTAMICIN Value in next row Resistant      <=4 SENSITIVEThis is a modified FDA-approved test that has been validated and its performance characteristics determined by the reporting laboratory.  This laboratory is certified under the Clinical Laboratory Improvement Amendments CLIA as qualified to perform high complexity clinical laboratory testing.    NITROFURANTOIN Value in next row Resistant      <=4 SENSITIVEThis is a modified FDA-approved test that has  been validated and its performance characteristics determined by the reporting laboratory.  This laboratory is certified under the Clinical Laboratory Improvement Amendments CLIA as qualified to perform high complexity clinical laboratory testing.    TRIMETH/SULFA Value in next row Sensitive      <=4 SENSITIVEThis is a modified FDA-approved test that has been validated and its performance characteristics determined by the reporting laboratory.  This laboratory is certified under the Clinical Laboratory Improvement Amendments CLIA as qualified to perform high complexity clinical laboratory testing.    AMPICILLIN/SULBACTAM Value in next row Resistant      <=4 SENSITIVEThis is a modified FDA-approved test that has been validated and its performance characteristics determined by the reporting laboratory.  This laboratory is certified under the Clinical Laboratory Improvement Amendments CLIA as qualified to perform high complexity clinical laboratory testing.    PIP/TAZO Value in next row Sensitive      <=4 SENSITIVEThis is a modified FDA-approved test that has been validated and its performance characteristics determined by the reporting laboratory.  This laboratory is certified under the Clinical Laboratory Improvement Amendments CLIA as qualified to perform high complexity clinical laboratory testing.    MEROPENEM Value in next row Sensitive      <=4 SENSITIVEThis is a modified FDA-approved test that has been validated and its performance characteristics determined by the reporting laboratory.  This laboratory is certified under the Clinical Laboratory Improvement Amendments CLIA as qualified to perform high complexity clinical laboratory testing.    * >=100,000 COLONIES/mL PROVIDENCIA STUARTII  Culture, Urine (Do not remove urinary catheter, catheter placed by urology or difficult to place)     Status: Abnormal   Collection Time: 07/29/24 10:26 AM   Specimen: Urine, Catheterized  Result Value Ref Range  Status   Specimen Description   Final    URINE, CATHETERIZED Performed at [facility], [address] [place], [place] [address]    Special Requests   Final    NONE Performed at [facility], [address] [place], [place] [address]    Culture (A)  Final    >=100,000 COLONIES/mL ESCHERICHIA COLI >=100,000 COLONIES/mL PROVIDENCIA STUARTII SUSCEPTIBILITIES PERFORMED ON PREVIOUS CULTURE WITHIN THE LAST 5 DAYS. Performed at [facility], [address], [place], [place] [address]    Report Status 07/31/2024 FINAL  Final  MRSA Next Gen by PCR, Nasal     Status: None   Collection Time: 07/29/24  3:00 PM   Specimen: Nasal Mucosa; Nasal Swab  Result Value Ref Range Status   MRSA by PCR Next Gen NOT DETECTED NOT DETECTED Final    Comment: (NOTE) The GeneXpert MRSA Assay (FDA approved for NASAL specimens only), is one component of a comprehensive MRSA colonization surveillance program. It is not intended to diagnose MRSA infection nor to guide or monitor treatment for MRSA infections. Test performance is not FDA approved in patients less than 39 years old. Performed at [facility], [address] [place],  [address]      Scheduled Meds:  acetaminophen   650 mg Oral TID   ascorbic acid  500 mg Oral Daily   bisacodyl   10 mg Rectal QPM   Chlorhexidine  Gluconate Cloth  6 each Topical Daily   collagenase   Topical Daily   DULoxetine   20 mg Oral Daily   feeding supplement  1 Container Oral TID BM   fesoterodine  4 mg Oral Daily   folic acid   1 mg Oral Daily   gabapentin   300 mg Oral BID   insulin  aspart  0-5 Units Subcutaneous QHS   insulin  aspart  0-9 Units Subcutaneous TID WC   lamoTRIgine   150 mg Oral BID   levETIRAcetam   500 mg Intravenous Q24H   linaclotide   290 mcg Oral QAC breakfast   pantoprazole  (PROTONIX ) IV  40 mg Intravenous Q12H   polyethylene glycol  17 g Oral BID   rosuvastatin   20 mg Oral QHS   Continuous Infusions:  cefTRIAXone  (ROCEPHIN )  IV 1 g  (08/01/24 1320)    Procedures/Studies: CT RENAL STONE STUDY Result Date: 08/01/2024 CLINICAL DATA:  Neurogenic bladder dysfunction and recurrent hematuria. EXAM: CT ABDOMEN AND PELVIS WITHOUT CONTRAST TECHNIQUE: Multidetector CT imaging of the abdomen and pelvis was performed following the standard protocol without IV contrast. RADIATION DOSE REDUCTION: This exam was performed according to the departmental dose-optimization program which includes automated exposure control, adjustment of the mA and/or kV according to patient size and/or use of iterative reconstruction technique. COMPARISON:  11/05/2023 FINDINGS: Lower chest: Normal-sized heart.  Stable bibasilar linear scarring. Hepatobiliary: Stable previously demonstrated small liver cysts not requiring imaging follow-up. Interval mild calcification of multiple small gallstones in the gallbladder measuring up to approximately 3 mm in maximum diameter each. No gallbladder wall thickening or pericholecystic fluid. Stable gallbladder Phrygian cap. Pancreas: Unremarkable. No pancreatic ductal dilatation or surrounding inflammatory changes. Spleen: Normal in size without focal abnormality. Adrenals/Urinary Tract: Normal-appearing adrenal glands. Stable moderate to marked left renal atrophy. Unremarkable ureters. Foley catheter in the urinary bladder with associated air in the bladder. There is also an interval oval area of calcific, soft tissue and air density in the central urinary bladder, measuring 5.2 x 3.9 x 2.6 cm. Moderate surrounding bladder wall thickening. Adjacent vagina, small bowel and sigmoid colon with no visible connections to the urinary bladder. Stomach/Bowel: Retained barium throughout much of the colon. Multiple descending and sigmoid colon diverticula. No evidence of diverticulitis. Surgically absent appendix. Unremarkable stomach and small bowel. Vascular/Lymphatic: Atheromatous arterial calcifications without aneurysm. No enlarged  lymph  nodes. Reproductive: Status post hysterectomy. No adnexal masses. Other: Tiny umbilical hernia containing fat. Mild presacral soft tissue thickening with improvement. Musculoskeletal: Bilateral femoral head avascular necrosis. Lumbar spine degenerative changes. IMPRESSION: 1. Interval 5.2 x 3.9 x 2.6 cm oval area of calcific, soft tissue and air density in the central urinary bladder. This is concerning for a possible colovesical fistula with associated barium and gas in the bladder and associated infectious cystitis. 2. Descending and sigmoid colon diverticulosis without evidence of diverticulitis. 3. Stable moderate to marked left renal atrophy. 4. Cholelithiasis. 5. Bilateral femoral head avascular necrosis. Electronically Signed   By: Elspeth Bathe M.D.   On: 08/01/2024 16:16   CT HEAD WO CONTRAST ( ) Result Date: 07/29/2024 EXAM: CT HEAD WITHOUT CONTRAST 07/29/2024 02:48:59 PM TECHNIQUE: CT of the head was performed without the administration of intravenous contrast. Automated exposure control, iterative reconstruction, and/or weight based adjustment of the mA/kV was utilized to reduce the radiation dose to as low as reasonably achievable. COMPARISON: Head CT 03/23/2023. Brain MRI 07/07/2021. CLINICAL HISTORY: 88 year old female with altered mental status. FINDINGS: BRAIN AND VENTRICLES: Brain volume remains normal for age. No acute hemorrhage. No evidence of acute infarct. No hydrocephalus. No extra-axial collection. No mass effect or midline shift. Gray white differentiation is stable and largely normal for age. Minimal to mild chronic white matter changes. Calcified atherosclerosis at the skull base. No suspicious intracranial vascular hyperdensity. ORBITS: No acute abnormality. 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 Chronic small volume fluid or mucosal thickening in the left sphenoid sinus is stable since last year. SOFT TISSUES AND SKULL: No acute soft tissue abnormality. No  skull fracture. Tympanic cavities remain well aerated. Improved left mastoid aeration since last year, mild mastoid effusions now, significance doubtful. IMPRESSION: 1. No acute intracranial abnormality. Negative for age non-contrast CT appearance of the brain. Electronically signed by: Helayne Hurst MD 07/29/2024 05:21 PM EST RP Workstation: [id number]    Alm Schneider, DO  [facility]  If 7PM-7AM, please contact night-coverage [email or web address] Password [facility] 08/01/2024, 6:38 PM   LOS: 2 days

## 2024-08-01 NOTE — Plan of Care (Signed)
  Problem: Coping: Goal: Level of anxiety will decrease Outcome: Progressing   Problem: Elimination: Goal: Will not experience complications related to bowel motility Outcome: Progressing   Problem: Pain Managment: Goal: General experience of comfort will improve and/or be controlled Outcome: Progressing   Problem: Safety: Goal: Ability to remain free from injury will improve Outcome: Progressing   Problem: Education: Goal: Knowledge of General Education information will improve Description: Including pain rating scale, medication(s)/side effects and non-pharmacologic comfort measures Outcome: Not Progressing

## 2024-08-01 NOTE — TOC Progression Note (Addendum)
 Transition of Care [facility]) - Progression Note    Patient Details  Name: [name] MRN: [id number] Date of Birth: 02-Jul-1936  Transition of Care [facility]) CM/SW Contact  Mcarthur Saddie Kim, [place] Phone Number: 08/01/2024, 8:33 AM  Clinical Narrative: TOC received consult to refer to outpatient palliative to follow at SNF. [facility] prefers to make referral for outpatient palliative if recommended. [name] at [facility] notified and will follow up with NP there. Pt's granddaughter updated and agreeable.     Update: Facility is agreeable and requests hospital make referral. Discussed with granddaughter who requests [facility]. Referral made and Kwante at [facility] notified.   Expected Discharge Plan: Long Term Nursing Home Barriers to Discharge: Continued Medical Work up               Expected Discharge Plan and Services                                               Social Drivers of Health (SDOH) Interventions SDOH Screenings   Food Insecurity: No Food Insecurity (07/29/2024)  Housing: Low Risk  (07/29/2024)  Transportation Needs: No Transportation Needs (07/29/2024)  Utilities: Not At Risk (07/29/2024)  Depression (PHQ2-9): Low Risk  (05/31/2024)  Social Connections: Unknown (07/29/2024)  Tobacco Use: Low Risk  (07/29/2024)    Readmission Risk Interventions     No data to display

## 2024-08-02 ENCOUNTER — Encounter: Payer: Self-pay | Admitting: Adult Health

## 2024-08-02 ENCOUNTER — Non-Acute Institutional Stay (SKILLED_NURSING_FACILITY): Payer: Self-pay | Admitting: Adult Health

## 2024-08-02 DIAGNOSIS — G8252 Quadriplegia, C1-C4 incomplete: Secondary | ICD-10-CM

## 2024-08-02 DIAGNOSIS — M87051 Idiopathic aseptic necrosis of right femur: Secondary | ICD-10-CM

## 2024-08-02 DIAGNOSIS — N184 Chronic kidney disease, stage 4 (severe): Secondary | ICD-10-CM

## 2024-08-02 DIAGNOSIS — I700 Atherosclerosis of aorta: Secondary | ICD-10-CM

## 2024-08-02 DIAGNOSIS — E1142 Type 2 diabetes mellitus with diabetic polyneuropathy: Secondary | ICD-10-CM

## 2024-08-02 DIAGNOSIS — E039 Hypothyroidism, unspecified: Secondary | ICD-10-CM

## 2024-08-02 DIAGNOSIS — R4189 Other symptoms and signs involving cognitive functions and awareness: Secondary | ICD-10-CM

## 2024-08-02 DIAGNOSIS — E1169 Type 2 diabetes mellitus with other specified complication: Secondary | ICD-10-CM

## 2024-08-02 DIAGNOSIS — K5909 Other constipation: Secondary | ICD-10-CM

## 2024-08-02 DIAGNOSIS — S14124S Central cord syndrome at C4 level of cervical spinal cord, sequela: Secondary | ICD-10-CM

## 2024-08-02 DIAGNOSIS — K219 Gastro-esophageal reflux disease without esophagitis: Secondary | ICD-10-CM

## 2024-08-02 DIAGNOSIS — D638 Anemia in other chronic diseases classified elsewhere: Secondary | ICD-10-CM

## 2024-08-02 DIAGNOSIS — K922 Gastrointestinal hemorrhage, unspecified: Secondary | ICD-10-CM

## 2024-08-02 DIAGNOSIS — E1122 Type 2 diabetes mellitus with diabetic chronic kidney disease: Secondary | ICD-10-CM

## 2024-08-02 LAB — GLUCOSE, CAPILLARY
Glucose-Capillary: 125 mg/dL — ABNORMAL HIGH (ref 70–99)
Glucose-Capillary: 154 mg/dL — ABNORMAL HIGH (ref 70–99)

## 2024-08-02 LAB — TYPE AND SCREEN
ABO/RH(D): B POS
Antibody Screen: NEGATIVE
Unit division: 0
Unit division: 0

## 2024-08-02 LAB — BPAM RBC
Blood Product Expiration Date: 202512062359
Blood Product Expiration Date: 202512152359
ISSUE DATE / TIME: 202511291259
ISSUE DATE / TIME: 202512021054
Unit Type and Rh: 1700
Unit Type and Rh: 1700

## 2024-08-02 MED ORDER — PANTOPRAZOLE SODIUM 40 MG PO TBEC: 40.0000 mg | DELAYED_RELEASE_TABLET | Freq: Two times a day (BID) | ORAL | Status: AC

## 2024-08-02 NOTE — Plan of Care (Signed)

## 2024-08-02 NOTE — Progress Notes (Unsigned)
 Location:  [facility] Room Number: 154 Place of Service:  SNF (31)   CODE STATUS: dnr   Allergies  Allergen Reactions  . Ace Inhibitors Other (See Comments)    Hyperkalemia   . Codeine Other (See Comments)    Unknown   . Sulfa Antibiotics Other (See Comments)    Unknown     Chief Complaint  Patient presents with  . Hospitalization Follow-up    HPI:  She is a 88 year old long term resident of this facility who has been hospitalized from 07-29-24 through 08-02-24. Her past medical history includes: C4 central cord syndrome with quadriplegia; type 2 diabetes mellitus; cognitive impairment; ckd stage 4; neurogenic bladder with chronic foley. She presented to the ED with hgb of 6.9. She is a poor historian. She had been more somnolent for the past month or so according to her daughter. She has had a decline in her overall status for the past 6 to 12 months. Her hemoccult was positive. GI was consulted.  Acute blood loss anemia/heme positive stool: required 2 units of packed sell on 11/29 and 12/2, hgb after transfusion was 8.9. Is on protonix  twice daily family has declined EGD.  Hematuria: could be contributing to low hgb. Has had intermittent hematuria since 02-11-22.   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 This is concerning for a possible colovesical fistula with associated barium and gas inthe bladder. Based upon Goals of Care: family would not want any operative intervention. This does explain dark/black urine and her frequently uti's.  Cauti: foley was changes on 07-28-24. Dr. Sherrilee consulted will need outpatient cystoscopy. Urine culture: with e-coli and providencia; did complete 5 days of abt.  She will continue to be followed for her chronic illnesses including: ***  Past Medical History:  Diagnosis Date  . Acute cystitis without hematuria   . Adult  failure to thrive   . Anemia   . Anxiety   . Atherosclerosis of aorta   . Central cord syndrome at C4 level of cervical spinal cord, subsequent encounter (HCC)   . Chronic kidney disease, stage IV (severe) (HCC)   . CKD (chronic kidney disease)    stage 3  . Depression   . DM type 2 with diabetic peripheral neuropathy (HCC)   . Dysphagia   . GERD (gastroesophageal reflux disease)   . Gout   . High cholesterol   . HTN (hypertension)   . Hyponatremia   . MDD (major depressive disorder)   . Neck pain   . Neuropathy   . Quadriplegia, C1-C4 incomplete (HCC)   . Radiculopathy   . RLS (restless legs syndrome)   . Unspecified convulsions (HCC)   . Urinary retention     Past Surgical History:  Procedure Laterality Date  . ABDOMINAL HYSTERECTOMY    . ANTERIOR CERVICAL DECOMP/DISCECTOMY FUSION N/A 02/05/2021   Procedure: Cervical Three-Four  Anterior cervical decompression/discectomy/fusion;  Surgeon: Gillie Duncans, MD;  Location: [facility] OR;  Service: Neurosurgery;  Laterality: N/A;  RM 20  . APPENDECTOMY    . BACK SURGERY    . BALLOON DILATION N/A 11/19/2022   Procedure: BALLOON DILATION;  Surgeon: Cindie Carlin POUR, DO;  [facility];  Service: Endoscopy;  Laterality: N/A;  . BIOPSY  09/22/2021   Procedure: BIOPSY;  Surgeon: Cindie Carlin POUR, DO;  [facility];  Service: Endoscopy;;  .  BIOPSY  11/19/2022   Procedure: BIOPSY;  Surgeon: Cindie Carlin POUR, DO;  [facility];  Service: Endoscopy;;  . CERVICAL DISC SURGERY    . ESOPHAGOGASTRODUODENOSCOPY (EGD) WITH PROPOFOL  N/A 09/22/2021   Procedure: ESOPHAGOGASTRODUODENOSCOPY (EGD) WITH PROPOFOL ;  Surgeon: Cindie Carlin POUR, DO;  [facility];  Service: Endoscopy;  Laterality: N/A;  1:00pm  . ESOPHAGOGASTRODUODENOSCOPY (EGD) WITH PROPOFOL  N/A 11/19/2022   Procedure: ESOPHAGOGASTRODUODENOSCOPY (EGD) WITH PROPOFOL ;  Surgeon: Cindie Carlin POUR, DO;  [facility];  Service: Endoscopy;   Laterality: N/A;  11:30 am, asa 3  . FLEXIBLE SIGMOIDOSCOPY  09/20/2023   Procedure: FLEXIBLE SIGMOIDOSCOPY;  Surgeon: Cindie Carlin POUR, DO;  [facility];  Service: Endoscopy;;  . HAND SURGERY    . KNEE SURGERY      Social History   Socioeconomic History  . Marital status: Widowed    Spouse name: Not on file  . Number of children: Not on file  . Years of education: Not on file  . Highest education level: Not on file  Occupational History  . Not on file  Tobacco Use  . Smoking status: Never  . Smokeless tobacco: Never  Vaping Use  . Vaping status: Never Used  Substance and Sexual Activity  . Alcohol use: Never  . Drug use: Never  . Sexual activity: Not Currently  Other Topics Concern  . Not on file  Social History Narrative   L[facility] at [facility]   Social Drivers of Health   Financial Resource Strain: Not on file  Food Insecurity: No Food Insecurity (07/29/2024)   Hunger Vital Sign   . Worried About Programme Researcher, Broadcasting/film/video in the Last Year: Never true   . Ran Out of Food in the Last Year: Never true  Transportation Needs: No Transportation Needs (07/29/2024)   PRAPARE - Transportation   . Lack of Transportation (Medical): No   . Lack of Transportation (Non-Medical): No  Physical Activity: Not on file  Stress: Not on file  Social Connections: Unknown (07/29/2024)   Social Connection and Isolation Panel   . Frequency of Communication with Friends and Family: Patient declined   . Frequency of Social Gatherings with Friends and Family: Patient declined   . Attends Religious Services: Patient declined   . Active Member of Clubs or Organizations: Patient declined   . Attends Banker Meetings: Patient declined   . Marital Status: Widowed  Intimate Partner Violence: Patient Unable To Answer (07/29/2024)   Humiliation, Afraid, Rape, and Kick questionnaire   . Fear of Current or Ex-Partner: Patient unable to answer   . Emotionally Abused:  Patient unable to answer   . Physically Abused: Patient unable to answer   . Sexually Abused: Patient unable to answer   Family History  Problem Relation Age of Onset  . Cancer Mother   . Cardiomyopathy Father   . Seizures Sister        childhood  . Seizures Brother        in his 30s  . Colon cancer Neg Hx       VITAL SIGNS BP 136/64   Pulse 62   Temp (!) 97.2 F (36.2 C)   Resp 18   Ht 5' 2 (1.575 m)   Wt 158 lb 11.2 oz (72 kg)   SpO2 95%   BMI 29.03 kg/m   Outpatient Encounter Medications as of 08/02/2024  Medication Sig  . acetaminophen  (TYLENOL ) 325 MG tablet Take 650 mg  by mouth 3 (three) times daily.  . AMBULATORY NON FORMULARY MEDICATION Medication Name:  Continue monthly catheter changes with 71fr catheter at skilled nursing facility.  . Amino Acids-Protein Hydrolys (FEEDING SUPPLEMENT, PRO-STAT 64,) LIQD Take 30 mLs by mouth in the morning and at bedtime.  SABRA ascorbic acid (VITAMIN C) 500 MG tablet Take 500 mg by mouth daily.  . bisacodyl  (DULCOLAX) 10 MG suppository Place 10 mg rectally every Thursday.  . camphor-menthol (SARNA) lotion Apply 1 Application topically 2 (two) times daily as needed for itching.  . Continuous Glucose Receiver (FREESTYLE LIBRE 2 READER) DEVI by Does not apply route. Friday  . Continuous Glucose Sensor (FREESTYLE LIBRE 2 SENSOR) MISC   . dronabinol  (MARINOL ) 2.5 MG capsule Take 1 capsule (2.5 mg total) by mouth daily before lunch for 27 days.  . DULoxetine  (CYMBALTA ) 20 MG capsule Take 20 mg by mouth daily.  . famotidine (PEPCID) 20 MG tablet Take 20 mg by mouth in the morning.  . ferrous sulfate  325 (65 FE) MG EC tablet Take 325 mg by mouth 3 (three) times a week. Monday, Wednesday, and Friday.  . folic acid  (FOLVITE ) 1 MG tablet Take 1 mg by mouth daily.  . gabapentin  (NEURONTIN ) 300 MG capsule Take 300 mg by mouth 2 (two) times daily.  . insulin  aspart (NOVOLOG  FLEXPEN) 100 UNIT/ML FlexPen Inject 5 Units into the skin 3 (three)  times daily with meals. 8 am, 12 pm, and 6 pm  Hold if pt eats <50% of meals  . Insulin  Pen Needle 30G X 5 MM MISC 1 Device by Does not apply route daily. 3/16  . ipratropium-albuterol  (DUONEB) 0.5-2.5 (3) MG/3ML SOLN Take 3 mLs by nebulization every 6 (six) hours as needed (wheezing).  . lamoTRIgine  (LAMICTAL ) 150 MG tablet Take 150 mg by mouth 2 (two) times daily.  . levETIRAcetam  (KEPPRA ) 250 MG tablet Take 250 mg by mouth 2 (two) times daily.  . LINZESS  145 MCG CAPS capsule Take 145 mcg by mouth daily.  SABRA loratadine (CLARITIN) 10 MG tablet Take 10 mg by mouth daily as needed for allergies.  . Magnesium  Hydroxide (MILK OF MAGNESIA PO) Take 30 mLs by mouth as needed (indigestion).  . melatonin 5 MG TABS Take 5 mg by mouth at bedtime.  . midazolam  (VERSED ) 10 MG/2ML SOLN injection Inject 0.4 mLs (2 mg total) into the muscle every 15 (fifteen) minutes as needed for agitation. (Patient taking differently: Inject 2 mg into the muscle every 15 (fifteen) minutes as needed for agitation. Administer 10mg  (one 2ml vial) IM every 15 minutes  up to (4) total doses for seizures lasting >1 minute -)  . ondansetron  (ZOFRAN -ODT) 4 MG disintegrating tablet Take 4 mg by mouth every 8 (eight) hours as needed for nausea or vomiting.  . pantoprazole  (PROTONIX ) 40 MG tablet Take 1 tablet (40 mg total) by mouth 2 (two) times daily.  . Phenylephrine -Cocoa Butter 0.25-85.5 % SUPP Place 1 suppository rectally 2 (two) times daily as needed (hemorrhoids).  . rosuvastatin  (CRESTOR ) 20 MG tablet Take 20 mg by mouth at bedtime.  SABRA SANTYL 250 UNIT/GM ointment Apply 1 Application topically daily.  Apply to sacral wound daily per tx orders.  . sennosides-docusate sodium  (SENOKOT-S) 8.6-50 MG tablet Take 2 tablets by mouth in the morning and at bedtime.  . sodium bicarbonate  650 MG tablet Take 1 tablet (650 mg total) by mouth 2 (two) times daily.  . solifenacin (VESICARE) 5 MG tablet Take 5 mg by mouth at bedtime.  SABRA  zinc  sulfate, 50mg  elemental zinc, 220 (50 Zn) MG capsule Take 220 mg by mouth daily.      SIGNIFICANT DIAGNOSTIC EXAMS   LABS REVIEWED PREVIOUS         09-21-23: glucose 110 bun 23; creat 1.41; k+ 4.8; na++ 130; ca 9.0; gfr 37; mag 2.0  10-12-23: ACR 1050. 11-09-23: glucose 93; bun 27; creat 1.83; k+ 4.2; na++ 137; ca 9.2; gfr 27 11-22-23: wbc 4.6; hgb 8.5; hct 26.2; mcv 95.3 plt 216; glucose 169; bun 39; creat 1.58; k+ 4.5; na++ 135; ca 9.3 gfr 32; protein 6.8 albumin  3.3; hgb A1c 6.0  urine microalbumin 966.9  02-14-24: wbc 4.0; hgb 7.9; hct 24.7; mcv 994.3 plt 186; glucose 103; bun 24; creat 1.75; k+ 4.3; na++ 131; ca 8.5 gfr 28; protein 6.3 albumin  3.0; hgb A1c 6.3; rpr: nr; vitamin B 12: 408; folate 2.9; iron 49; tsh 3.293; chol 98; ldl 40; trig 136; hdl 31  89-80-74: wbc 5.0; hgb 8.7; hct 27.5; mcv 101.9; plt 197; glucose 226; bun 44; creat 2.18; k+ 4.6; na++ 140; ca 11.7; gfr 22 06-19-24: hgb A1c 6.7 06-20-24: pth <6; folate >20; vitamin B 12: 1090 07-11-24: wbc 4.6; hgb 7.4; hct 23.3; mcv 101.3 plt 230; glucose 221; bun 48; creat 2.06 ;k+ 4.8; na++ 138; ca 9.4; gfr 23 07-13-24: urine culture: mixed species   TODAY  07-29-24: wbc 8.4; hgb 6.7; hct 21.4; mcv 100.9 plt 323; glucose 175; bun 93; creat 2.72; k+ 5.8; na++ 138; ca 8.7; gfr 17; protein 6.5 albumin  3.2; vitamin B 12: 1296; iron 32; tibc 202; urine culture: e-coli and providencia stuart II 08-01-24: wbc 5.0; hgb 7.4; hct 23.3; mcv 107.7; plt 237; glucose 98; bun 56; creat 1.72; k+ 4.2; na++ 140; ca 7.8; gfr 29; tsh 3.900   Review of Systems  Constitutional:  Negative for malaise/fatigue.       No appetite   Respiratory:  Negative for cough and shortness of breath.   Cardiovascular:  Negative for chest pain, palpitations and leg swelling.  Gastrointestinal:  Negative for abdominal pain, constipation and heartburn.  Musculoskeletal:  Negative for back pain, joint pain and myalgias.  Skin: Negative.   Neurological:  Negative for  dizziness.  Psychiatric/Behavioral:  The patient is not nervous/anxious.     Physical Exam Constitutional:      General: She is not in acute distress.    Appearance: She is well-developed. She is not diaphoretic.  Neck:     Thyroid : No thyromegaly.  Cardiovascular:     Rate and Rhythm: Normal rate and regular rhythm.     Heart sounds: Normal heart sounds.  Pulmonary:     Effort: Pulmonary effort is normal. No respiratory distress.     Breath sounds: Normal breath sounds.  Abdominal:     General: Bowel sounds are normal. There is no distension.     Palpations: Abdomen is soft.     Tenderness: There is no abdominal tenderness.  Musculoskeletal:     Cervical back: Neck supple.     Right lower leg: No edema.     Left lower leg: No edema.     Comments:  Limited range of motion in upper extremities Does not move lower extremities     Lymphadenopathy:     Cervical: No cervical adenopathy.  Skin:    General: Skin is warm and dry.  Neurological:     Mental Status: She is alert and oriented to person, place, and time.  Physical Exam Constitutional:      General: She is not in acute distress.    Appearance: She is well-developed. She is not diaphoretic.  Neck:     Thyroid : No thyromegaly.  Cardiovascular:     Rate and Rhythm: Normal rate and regular rhythm.     Heart sounds: Normal heart sounds.  Pulmonary:     Effort: Pulmonary effort is normal. No respiratory distress.     Breath sounds: Normal breath sounds.  Abdominal:     General: Bowel sounds are normal. There is no distension.     Palpations: Abdomen is soft.     Tenderness: There is no abdominal tenderness.  Genitourinary:    Comments: Foley Musculoskeletal:     Cervical back: Neck supple.     Right lower leg: No edema.     Left lower leg: No edema.     Comments: Limited range of motion in upper extremities Does not move lower extremities    Lymphadenopathy:     Cervical: No cervical adenopathy.  Skin:     General: Skin is warm and dry.     Comments: Sacrum: 4 x 5 x 0.2cm  Neurological:     Mental Status: She is alert. Mental status is at baseline.  Psychiatric:        Mood and Affect: Mood normal.        ASSESSMENT/ PLAN:  TODAY  Chronic anxiety: is off medications  2. Protein calorie malnutrition severe: albumin  3.3; protein 6.8; will continue supplements as directed  3. Aortic atherosclerosis (ct 01-26-21) is on asa and statin  PREVIOUS   4. Central cord syndrome at C4 level of cervical spine compression subsequent encounter/code compression quadriplegia C1-4 (01-25-21) asa 81 mg daily.  5. Neurocognitive deficits: 03-22-23 SLUMS 14/30  6. Restless leg syndrome: will continue requip  1 mg nightly  7. Hypertension associated with stage 4 chronic kidney disease due to type 2 diabetes mellitus: b/p 118/53 will continue norvasc 10 mg daily   8. Seizure: will continue keppra  500 mg twice daily; will continue lamictal  150 mg twice daily   9. Hyperlipidemia associated with type 2 diabetes mellitus: ldl 73; will continue crestor  20 mg daily   10. Post menopausal osteoporosis t score -3.391  11. Type 2 diabetes mellitus with neuropathy with long term current use of insulin : hgb A1c 6.3; will continue farxiga  5 mg daily (did not tolerate 10 mg) will continue novolog  5 units with meals will lower her lantus  to 20 units nightly will monitor   12. Weight loss: current weight is 154 pounds  13. GERD without esophagitis: is presently off medications.   14. Chronic constipation: will continue miralax  daily and linzess  72 mcg daily and senna s daily  15. Lumbar radicular syndrome/avascular necrosis bilateral hips: will continue tylenol  650 mg every 8 hours; gabapentin  300 mg three times daily and cymbalta  20 mg daily   16. Diabetic peripheral neuropathy: will continue gabapentin  300 mg three times daily    17. CKD stage 4 due to type 2 diabetes mellitus: bun 48; creat 2.06; gfr  23  18. Anemia associated with type 2 diabetes mellitus due to underlying condition: hgb 7.4 will continue iron three times weekly  19. Urine retention/neurogenic bladder: has long term foley is followed by urology          Barnie Seip NP [facility]   call [phone]

## 2024-08-02 NOTE — TOC Transition Note (Signed)
 Transition of Care [facility]) - Discharge Note   Patient Details  Name: [name] MRN: [id number] Date of Birth: [birth year]  Transition of Care [facility]) CM/SW Contact:  Mcarthur Saddie Kim, LCSW Phone Number: 08/02/2024, 10:10 AM   Clinical Narrative:  Pt d/c today back to [facility]. Pt's granddaughter and facility aware and agreeable. D/C summary will be sent to SNF upon completion. Pt will transfer with staff. RN given number to call report.      Final next level of care: Skilled Nursing Facility Barriers to Discharge: Barriers Resolved   Patient Goals and CMS Choice Patient states their goals for this hospitalization and ongoing recovery are:: Return to [facility]          Discharge Placement                Patient to be transferred to facility by: staff Name of family member notified: granddaughter Patient and family notified of of transfer: 08/02/24  Discharge Plan and Services Additional resources added to the After Visit Summary for                                       Social Drivers of Health (SDOH) Interventions SDOH Screenings   Food Insecurity: No Food Insecurity (07/29/2024)  Housing: Low Risk  (07/29/2024)  Transportation Needs: No Transportation Needs (07/29/2024)  Utilities: Not At Risk (07/29/2024)  Depression (PHQ2-9): Low Risk  (05/31/2024)  Social Connections: Unknown (07/29/2024)  Tobacco Use: Low Risk  (07/29/2024)     Readmission Risk Interventions     No data to display

## 2024-08-02 NOTE — Progress Notes (Signed)
 Called report to H[place], LPN at [facility].

## 2024-08-02 NOTE — Plan of Care (Signed)
   Problem: Coping: Goal: Level of anxiety will decrease Outcome: Progressing   Problem: Pain Managment: Goal: General experience of comfort will improve and/or be controlled Outcome: Progressing   Problem: Safety: Goal: Ability to remain free from injury will improve Outcome: Progressing

## 2024-08-02 NOTE — Discharge Summary (Signed)
 Physician Discharge Summary   Patient: [name] MRN: [id number] DOB: [birth year]  Admit date:     07/29/2024  Discharge date: 08/02/24  Discharge Physician: Bernardino KATHEE Come   PCP: [name], NP   Recommendations at discharge:  Follow up with urology after discharge for colovesical fistula, chronic indwelling foley catheter. Consider general surgery follow up if consideration of surgery is warranted.  Continue outpatient palliative care involvement at SNF. Suggest recheck BMP and CBC in the next week.   Discharge Diagnoses: Principal Problem:   Symptomatic anemia Active Problems:   Type 2 diabetes mellitus with diabetic neuropathy, unspecified (HCC)   DNR (do not resuscitate)   Quadriplegia, C1-C4 incomplete (HCC)   Type 2 diabetes mellitus with stage 4 chronic kidney disease, with long-term current use of insulin  (HCC)   Seizures (HCC)   Gross hematuria   UTI (urinary tract infection)   Neurogenic bladder   Foley catheter in place   CKD (chronic kidney disease) stage 4, GFR 15-29 ml/min (HCC)   Upper GI bleed   Pressure injury of skin  Hospital Course: 88 year old female with a history of of C4 central cord syndrome with quadriplegia, diabetes mellitus type 2, hypertension, hyperlipidemia, CKD stage IV, MDD, [place], cognitive impairment, constipation, and neurogenic bladder with chronic Foley catheter presenting from [facility] nursing center secondary to low hemoglobin on blood work with Hgb 6.9.  The patient is a poor historian secondary to her cognitive impairment.  History is supplemented by the patient's daughter and review of medical record.  Patient's daughter states that the patient has been a little bit more somnolent for the past month.  At the same time, she states that the patient continues to have is currently decline over the last 6 to 12 months.  Daughter is not aware of any hematochezia or melena.  The patient has not had any fevers, chills, chest pain,  shortness breath, hemoptysis, abd pain, vomiting, diarrhea.  P.o. intake has been poor.  At baseline, the patient is essentially bedbound.  In the ED, the patient was afebrile and hemodynamically stable with oxygen saturation 98% on room air.  WBC 8.4, hemoglobin 6.7, platelets 323.  Sodium 138, potassium 4 point, bicarbonate 25, serum creatinine 2.73.  AST 30, ALT 18, capacity 0, total bilirubin <0.2.  EKG shows sinus rhythm nonspecific T wave changes. Hemoccult was positive.  GI was consulted to assist with management.  Assessment and Plan: Acute Blood Loss Anemia/Heme Positive Stool -GI consulted -transfused 2 units PRBC total for hospitalization (11/29 and 12/2) - Patient had hemoglobin of 7.4 on 07/11/2024 and hemoglobin 8.5 on 06/19/2024. Hgb rechecked 12/2 after transfusions is up to 8.9g/dl. No active bleeding noted.  - Continue pantoprazole  twice daily - melena noted on dis-impaction 07/31/24 - family does not want EGD   Hematuria - Certainly this could be contributing to the patient's drop in hemoglobin - Review of the medical record shows that the patient has had intermittent hematuria since 02/11/2022   - Monitor CBC   Colovesical Fistula -08/01/24 CT AP Interval 5.2 x 3.9 x 2.6 cm oval area of calcific, soft tissue and air density in the central urinary bladder. This is concerning for a possible colovesical fistula with associated barium and gas inthe bladder -based on GOC, family would not want any operative intervention -this explains the recurrent hematuria, dark/black urine, and frequent UTIs -discussed with general surgery who recommended follow up after discharge on an as needed basis.    CAUTI: - Foley catheter  was exchanged on 07/28/2024, continue monthly.  - appreciate Dr. Sherrilee consult>>outpt cystoscopy -11/28 and 11/29 urine culture = E coli & Providencia. Has completed 5 days of culture-directed ceftriaxone  with no leukocytosis or fever and catheter has been  removed. - hold Farxiga    Acute on chronic renal failure--CKD stage IV - Baseline creatinine 1.8-2.1 - Presented with serum creatinine 2.72 - Secondary to hemodynamic changes and volume depletion - Continue to avoid nephrotoxins and continue PTA bicarbonate.    Acute Metabolic Encephalopathy -due to UTI and AKI -11/30--pt awake and alert.  Follows commands.  Speaks fluently, although pleasantly confused, knows she's in hospital -12/1 sister at beside states she is near baseline   Central cord syndrome at C4 level  C-spine cord compression/quadriplegia C1-4  -holding aspirin  -occurred 01/25/21   Hyperkalemia: Resolved. Recheck at follow up is suggested. - Not on ACE/ARB.   Unstageable sacral decubitus ulcer - Wound care consult appreciated - present prior to admission - photographs taken - Continue wound care and offloading after discharge.   Diabetes mellitus type 2 with hyperglycemia - Has had CBGs at inpatient goal with minimal insulin . Will continue her meal coverage (hold if takes < 50% meal) - Holding Farxiga  - 06/19/2024 hemoglobin A1c 6.7%, below goal for her age/goals of care.    Essential hypertension - Holding amlodipine initially secondary to soft blood pressure. Will plan to continue to monitor for now off meds at discharge.    Seizure disorder - Continue Keppra  500 mg daily and Lamictal  150 mg twice daily   Hyperlipidemia - Continue statin   Chronic constipation - Continue MiraLAX  and senna   Neurogenic bladder - Patient has chronic indwelling Foley catheter PTA - Foley catheter was changed on 07/20/2024   Major neurocognitive disorder - Risk for hospital delirium    Macrocytic anemia - 06/20/2024 B12 1090 - 06/20/2024 folic acid  >20   MDD - Continue duloxetine    GOC -palliative consulted.   -not ready for full comfort -does not want EGD -only agreeable to minimally invasive procedures  Consultants: Palliative care, GI, curbside consult  with general surgery Procedures performed: None  Disposition: Skilled nursing facility Diet recommendation: As tolerated DISCHARGE MEDICATION: Allergies as of 08/02/2024       Reactions   Ace Inhibitors Other (See Comments)   Hyperkalemia    Codeine Other (See Comments)   Unknown    Sulfa Antibiotics Other (See Comments)   Unknown         Medication List     STOP taking these medications    aluminum-magnesium  hydroxide 200-200 MG/5ML suspension   amLODipine 10 MG tablet Commonly known as: NORVASC   Farxiga  5 MG Tabs tablet Generic drug: dapagliflozin  propanediol   fluconazole 200 MG tablet Commonly known as: DIFLUCAN       TAKE these medications    acetaminophen  325 MG tablet Commonly known as: TYLENOL  Take 650 mg by mouth 3 (three) times daily.   AMBULATORY NON FORMULARY MEDICATION Medication Name:  Continue monthly catheter changes with 74fr catheter at skilled nursing facility.   ascorbic acid  500 MG tablet Commonly known as: VITAMIN C  Take 500 mg by mouth daily.   bisacodyl  10 MG suppository Commonly known as: DULCOLAX Place 10 mg rectally every Thursday.   camphor-menthol lotion Commonly known as: SARNA Apply 1 Application topically 2 (two) times daily as needed for itching.   dronabinol  2.5 MG capsule Commonly known as: Marinol  Take 1 capsule (2.5 mg total) by mouth daily before lunch for 27 days.  DULoxetine  20 MG capsule Commonly known as: CYMBALTA  Take 20 mg by mouth daily.   famotidine 20 MG tablet Commonly known as: PEPCID Take 20 mg by mouth in the morning.   feeding supplement (PRO-STAT 64) Liqd Take 30 mLs by mouth in the morning and at bedtime.   ferrous sulfate  325 (65 FE) MG EC tablet Take 325 mg by mouth 3 (three) times a week. Monday, Wednesday, and Friday.   folic acid  1 MG tablet Commonly known as: FOLVITE  Take 1 mg by mouth daily.   FreeStyle Libre 2 Reader [place] by Does not apply route. Friday   FreeStyle Libre  2 Sensor Misc   gabapentin  300 MG capsule Commonly known as: NEURONTIN  Take 300 mg by mouth 2 (two) times daily.   Insulin  Pen Needle 30G X 5 MM Misc 1 Device by Does not apply route daily. 3/16   ipratropium-albuterol  0.5-2.5 (3) MG/3ML Soln Commonly known as: DUONEB Take 3 mLs by nebulization every 6 (six) hours as needed (wheezing).   lamoTRIgine  150 MG tablet Commonly known as: LAMICTAL  Take 150 mg by mouth 2 (two) times daily.   levETIRAcetam  250 MG tablet Commonly known as: KEPPRA  Take 250 mg by mouth 2 (two) times daily.   Linzess  145 MCG Caps capsule Generic drug: linaclotide  Take 145 mcg by mouth daily.   loratadine 10 MG tablet Commonly known as: CLARITIN Take 10 mg by mouth daily as needed for allergies.   melatonin 5 MG Tabs Take 5 mg by mouth at bedtime.   midazolam  10 MG/2ML Soln injection Commonly known as: VERSED  Inject 0.4 mLs (2 mg total) into the muscle every 15 (fifteen) minutes as needed for agitation. What changed: additional instructions   MILK OF MAGNESIA PO Take 30 mLs by mouth as needed (indigestion).   NovoLOG  FlexPen 100 UNIT/ML FlexPen Generic drug: insulin  aspart Inject 5 Units into the skin 3 (three) times daily with meals. 8 am, 12 pm, and 6 pm  Hold if pt eats <50% of meals   ondansetron  4 MG disintegrating tablet Commonly known as: ZOFRAN -ODT Take 4 mg by mouth every 8 (eight) hours as needed for nausea or vomiting.   pantoprazole  40 MG tablet Commonly known as: Protonix  Take 1 tablet (40 mg total) by mouth 2 (two) times daily.   Phenylephrine -Cocoa Butter 0.25-85.5 % Supp Place 1 suppository rectally 2 (two) times daily as needed (hemorrhoids).   rosuvastatin  20 MG tablet Commonly known as: CRESTOR  Take 20 mg by mouth at bedtime.   Santyl  250 UNIT/GM ointment Generic drug: collagenase  Apply 1 Application topically daily.  Apply to sacral wound daily per tx orders.   sennosides-docusate sodium  8.6-50 MG  tablet Commonly known as: SENOKOT-S Take 2 tablets by mouth in the morning and at bedtime.   sodium bicarbonate  650 MG tablet Take 1 tablet (650 mg total) by mouth 2 (two) times daily.   solifenacin 5 MG tablet Commonly known as: VESICARE Take 5 mg by mouth at bedtime.   zinc sulfate (50mg  elemental zinc) 220 (50 Zn) MG capsule Take 220 mg by mouth daily.        Contact information for after-discharge care     Destination     [facility] .   Service: Skilled Nursing Contact information: [address] [place]  [address] [phone]                    Discharge Exam: Fredricka Weights   07/29/24 0945  Weight: 69.4 kg  Frail elderly female in no acute distress  Condition at discharge: stable  The results of significant diagnostics from this hospitalization (including imaging, microbiology, ancillary and laboratory) are listed below for reference.   Imaging Studies: CT RENAL STONE STUDY Result Date: 08/01/2024 CLINICAL DATA:  Neurogenic bladder dysfunction and recurrent hematuria. EXAM: CT ABDOMEN AND PELVIS WITHOUT CONTRAST TECHNIQUE: Multidetector CT imaging of the abdomen and pelvis was performed following the standard protocol without IV contrast. RADIATION DOSE REDUCTION: This exam was performed according to the departmental dose-optimization program which includes automated exposure control, adjustment of the mA and/or kV according to patient size and/or use of iterative reconstruction technique. COMPARISON:  11/05/2023 FINDINGS: Lower chest: Normal-sized heart.  Stable bibasilar linear scarring. Hepatobiliary: Stable previously demonstrated small liver cysts not requiring imaging follow-up. Interval mild calcification of multiple small gallstones in the gallbladder measuring up to approximately 3 mm in maximum diameter each. No gallbladder wall thickening or pericholecystic fluid. Stable gallbladder Phrygian cap. Pancreas: Unremarkable.  No pancreatic ductal dilatation or surrounding inflammatory changes. Spleen: Normal in size without focal abnormality. Adrenals/Urinary Tract: Normal-appearing adrenal glands. Stable moderate to marked left renal atrophy. Unremarkable ureters. Foley catheter in the urinary bladder with associated air in the bladder. There is also an interval oval area of calcific, soft tissue and air density in the central urinary bladder, measuring 5.2 x 3.9 x 2.6 cm. Moderate surrounding bladder wall thickening. Adjacent vagina, small bowel and sigmoid colon with no visible connections to the urinary bladder. Stomach/Bowel: Retained barium throughout much of the colon. Multiple descending and sigmoid colon diverticula. No evidence of diverticulitis. Surgically absent appendix. Unremarkable stomach and small bowel. Vascular/Lymphatic: Atheromatous arterial calcifications without aneurysm. No enlarged lymph nodes. Reproductive: Status post hysterectomy. No adnexal masses. Other: Tiny umbilical hernia containing fat. Mild presacral soft tissue thickening with improvement. Musculoskeletal: Bilateral femoral head avascular necrosis. Lumbar spine degenerative changes. IMPRESSION: 1. Interval 5.2 x 3.9 x 2.6 cm oval area of calcific, soft tissue and air density in the central urinary bladder. This is concerning for a possible colovesical fistula with associated barium and gas in the bladder and associated infectious cystitis. 2. Descending and sigmoid colon diverticulosis without evidence of diverticulitis. 3. Stable moderate to marked left renal atrophy. 4. Cholelithiasis. 5. Bilateral femoral head avascular necrosis. Electronically Signed   By: Elspeth Bathe M.D.   On: 08/01/2024 16:16   CT HEAD WO CONTRAST ( ) Result Date: 07/29/2024 EXAM: CT HEAD WITHOUT CONTRAST 07/29/2024 02:48:59 PM TECHNIQUE: CT of the head was performed without the administration of intravenous contrast. Automated exposure control, iterative reconstruction,  and/or weight based adjustment of the mA/kV was utilized to reduce the radiation dose to as low as reasonably achievable. COMPARISON: Head CT 03/23/2023. Brain MRI 07/07/2021. CLINICAL HISTORY: 88 year old female with altered mental status. FINDINGS: BRAIN AND VENTRICLES: Brain volume remains normal for age. No acute hemorrhage. No evidence of acute infarct. No hydrocephalus. No extra-axial collection. No mass effect or midline shift. Gray white differentiation is stable and largely normal for age. Minimal to mild chronic white matter changes. Calcified atherosclerosis at the skull base. No suspicious intracranial vascular hyperdensity. ORBITS: No acute abnormality. 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 Chronic small volume fluid or mucosal thickening in the left sphenoid sinus is stable since last year. SOFT TISSUES AND SKULL: No acute soft tissue abnormality. No skull fracture. Tympanic cavities remain well aerated. Improved left mastoid aeration since last year, mild mastoid effusions now, significance doubtful. IMPRESSION: 1. No acute intracranial  abnormality. Negative for age non-contrast CT appearance of the brain. Electronically signed by: Helayne Hurst MD 07/29/2024 05:21 PM EST RP Workstation: [id number]    Microbiology: Results for orders placed or performed during the hospital encounter of 07/29/24  Culture, Urine (Do not remove urinary catheter, catheter placed by urology or difficult to place)     Status: Abnormal   Collection Time: 07/29/24 10:26 AM   Specimen: Urine, Catheterized  Result Value Ref Range Status   Specimen Description   Final    URINE, CATHETERIZED Performed at [facility], [address] [place], [place] [address]    Special Requests   Final    NONE Performed at [facility], [address] [place], [place] [address]    Culture (A)  Final    >=100,000 COLONIES/mL ESCHERICHIA COLI >=100,000 COLONIES/mL PROVIDENCIA STUARTII SUSCEPTIBILITIES  PERFORMED ON PREVIOUS CULTURE WITHIN THE LAST 5 DAYS. Performed at [facility], [address], [place], [place] [address]    Report Status 07/31/2024 FINAL  Final  MRSA Next Gen by PCR, Nasal     Status: None   Collection Time: 07/29/24  3:00 PM   Specimen: Nasal Mucosa; Nasal Swab  Result Value Ref Range Status   MRSA by PCR Next Gen NOT DETECTED NOT DETECTED Final    Comment: (NOTE) The GeneXpert MRSA Assay (FDA approved for NASAL specimens only), is one component of a comprehensive MRSA colonization surveillance program. It is not intended to diagnose MRSA infection nor to guide or monitor treatment for MRSA infections. Test performance is not FDA approved in patients less than 49 years old. Performed at [facility], [address], [place], [place] [address]     Labs: CBC: Recent Labs  Lab 07/29/24 [phone] 07/29/24 0950 07/29/24 2100 07/30/24 0333 07/31/24 0351 08/01/24 0527 08/01/24 1559  WBC 8.7 8.4  --  6.8 6.3 5.0  --   NEUTROABS 5.9  --   --   --   --   --   --   HGB 6.9* 6.7* 8.7* 8.2* 7.8* 7.4* 8.9*  HCT 21.8* 21.4* 27.0* 25.4* 24.1* 23.3* 27.7*  MCV 100.9* 100.9*  --  99.2 100.0 101.7*  --   PLT 317 323  --  258 257 237  --    Basic Metabolic Panel: Recent Labs  Lab 07/29/24 0950 07/29/24 1652 07/30/24 0333 07/31/24 0351 08/01/24 0527  NA 138  --  140 140 140  K 5.8*  --  4.4 4.3 4.2  CL 101  --  104 106 106  CO2 25  --  21* 23 22  GLUCOSE 175*  --  112* 141* 98  BUN 93*  --  88* 78* 56*  CREATININE 2.72*  --  2.37* 2.00* 1.72*  CALCIUM  8.7*  --  8.2* 7.9* 7.8*  MG  --  3.8* 3.5* 3.4* 3.3*   Liver Function Tests: Recent Labs  Lab 07/29/24 0950  AST 30  ALT 18  ALKPHOS 81  BILITOT <0.2  PROT 6.5  ALBUMIN  3.2*   CBG: Recent Labs  Lab 08/01/24 0744 08/01/24 1229 08/01/24 1642 08/01/24 2124 08/02/24 0815  GLUCAP 105* 114* 126* 78 125*    Discharge time spent: greater than 30 minutes.  Signed: Bernardino KATHEE Come, MD [facility] 08/02/2024

## 2024-08-02 NOTE — Care Management Important Message (Signed)
 Important Message  Patient Details  Name: [name] MRN: [id number] Date of Birth: [birth year]   Important Message Given:  Yes - Medicare IM     Ausencio Vaden L Lucienne Sawyers 08/02/2024, 11:09 AM

## 2024-08-03 ENCOUNTER — Non-Acute Institutional Stay (SKILLED_NURSING_FACILITY): Payer: Self-pay | Admitting: Internal Medicine

## 2024-08-03 ENCOUNTER — Encounter: Payer: Self-pay | Admitting: Internal Medicine

## 2024-08-03 ENCOUNTER — Other Ambulatory Visit (HOSPITAL_COMMUNITY)
Admission: RE | Admit: 2024-08-03 | Discharge: 2024-08-03 | Disposition: A | Source: Skilled Nursing Facility | Attending: Adult Health | Admitting: Adult Health

## 2024-08-03 DIAGNOSIS — K922 Gastrointestinal hemorrhage, unspecified: Secondary | ICD-10-CM

## 2024-08-03 DIAGNOSIS — R627 Adult failure to thrive: Secondary | ICD-10-CM

## 2024-08-03 DIAGNOSIS — N390 Urinary tract infection, site not specified: Secondary | ICD-10-CM

## 2024-08-03 DIAGNOSIS — D649 Anemia, unspecified: Secondary | ICD-10-CM | POA: Insufficient documentation

## 2024-08-03 DIAGNOSIS — L89150 Pressure ulcer of sacral region, unstageable: Secondary | ICD-10-CM

## 2024-08-03 LAB — HEMOGLOBIN AND HEMATOCRIT, BLOOD
HCT: 23.9 % — ABNORMAL LOW (ref 36.0–46.0)
Hemoglobin: 7.6 g/dL — ABNORMAL LOW (ref 12.0–15.0)

## 2024-08-03 LAB — CYTOLOGY - NON PAP

## 2024-08-03 NOTE — Assessment & Plan Note (Signed)
 CT imaging 12/2 revealed 5.2 x 3.9 x 2.6 oval area of calcific soft tissue density in the central urinary bladder.  This suggest a possible colovesical fistula with associated barium and gas in the bladder.  Family did not wish to proceed with any operative intervention.  This was felt to explain the intermittent hematuria and frequent UTIs.  Foley catheter exchange 11/28 by Dr. Sherrilee.  Outpatient follow-up apparently requested with him.

## 2024-08-03 NOTE — Progress Notes (Unsigned)
 NURSING HOME LOCATION:  [facility] ROOM NUMBER:  [id number]  CODE STATUS:DNR  PCP: [name], NP   This is a nursing facility follow up visit for Nursing Facility readmission within 30 days.  Interim medical record and care since last SNF visit was updated with review of diagnostic studies and change in clinical status since last visit were documented.  HPI: She was readmitted to the hospital 11/29 - 08/02/2024 with severe anemia and a UTI.  She had been sent from the SNF when an incidental finding was severe anemia with hemoglobin 6.9.  The CBC and DIF had been performed because of dark urine reported by the family.  The patient was asymptomatic.  Subsequently the culture did reveal both E. coli as well as Providencia with greater than 100,000 colonies each. Repeat hemoglobin in the ED was 6.7.   Serum creatinine was 2.72  & GFR 17.  Hemoccults were positive. GI was consulted.  Pantoprazole  twice a day was initiated. EGD was declined by the family. CT imaging 12/2 revealed 5.2 x 3.9 x 2.6 oval area of calcific soft tissue and air density in the central urinary bladder.  This was of concern for possible colovesical fistula with associated barium and gas in the bladder.  Family did not wish to proceed with any operative intervention.  This was felt to explain the intermittent hematuria described as dark and black and frequent UTIs.   Foley catheter was exchanged on 11/28 by Dr. Sherrilee.  Monthly Foley exchanges were recommended.  She did complete 5 days of ceftriaxone .  Farxiga  was held.  While hospitalized glucoses ranged from a low of 78 up to high 189; both were outliers.  Final creatinine was 1.72 and GFR 29, indicating high stage IV CKD. She received 2 units of packed cells while hospitalized.  After the second unit hemoglobin was 8.9. Unstageable sacral decubitus ulcer was present.  Wound Care consulted. Clinically she returned to her baseline allowing return to the  nursing facility where she is a permanent resident.   Review of systems: Dementia invalidated responses. Staff reports she is not eating ;she denies this.  Her only complaint is that she is cold.  She has no concept of why she was hospitalized or what diagnoses were made.  She stated I forgot.  She denies any pain or any bleeding dyscrasias.  Physical exam:  Pertinent or positive findings: It was after 3:00 in the afternoon and she was still asleep.  She essentially slept through most of the exam and only aroused slightly.  She did interact as noted above.  While asleep mouth was agape and she was exhibiting hypopnea without snoring or apnea.  She is wearing only the upper plate.  Breath sounds are decreased.  Heart sounds are somewhat distant.  Bowel sounds are hyperactive.  Foley catheter is in place.  The urine is somewhat pinkish-orange without the hematuria which prompted the urine culture and CBC.  Pedal pulses are not palpable.  Limb atrophy & interosseous wasting present.  When the right pedal pulses were assessed; the great toe appeared to exhibit extension.  General appearance: no acute distress, increased work of breathing is present.   Lymphatic: No lymphadenopathy about the head, neck, axilla. Eyes: No conjunctival inflammation or lid edema is present. There is no scleral icterus. Ears:  External ear exam shows no significant lesions or deformities.   Nose:  External nasal examination shows no deformity or inflammation. Nasal mucosa are pink and moist  without lesions, exudates Neck:  No thyromegaly, masses, tenderness noted.    Heart:  No gallop, murmur, click, rub .  Lungs:  without wheezes, rhonchi, rales, rubs. Abdomen:  Abdomen is soft and nontender with no organomegaly, hernias, masses. GU: Deferred  Extremities:  No cyanosis, clubbing, edema  Skin: Warm & dry w/o tenting. No significant rash.  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  Nutritionist will continue to follow; but clinically she appears preterminal in the context of ESRD with severe anemia and and protein/caloric malnutrition.  Pressure injury of skin Wound Care Nurse documents unstageable sacral wound which has progressed.  It is described as 5.5 x 6.0 with 3.0 x 4.0 x 2.0 full depth center with soft necrotic tissue.  Contamination prevention necessitates maintenance Foley placement.  UTI (urinary tract infection) CT imaging 12/2 revealed 5.2 x 3.9 x 2.6 oval area of calcific soft tissue density in the central urinary bladder.  This suggest a possible colovesical fistula with associated barium and gas in the bladder.  Family did not wish to proceed with any operative intervention.  This was felt to explain the intermittent hematuria and frequent UTIs.  Foley catheter exchange 11/28 by Dr. Sherrilee.  Outpatient follow-up apparently requested with him.  Upper GI bleed In the context of dementia; she denies any GI symptoms.

## 2024-08-03 NOTE — Patient Instructions (Signed)
 See assessment and plan under each diagnosis in the problem list and acutely for this visit

## 2024-08-03 NOTE — Assessment & Plan Note (Signed)
 Staff reports she is not eating; she does not validate this.  Nutritionist will continue to follow; but clinically she appears preterminal in the context of ESRD with severe anemia and and protein/caloric malnutrition.

## 2024-08-03 NOTE — Assessment & Plan Note (Signed)
 Wound Care Nurse documents unstageable sacral wound which has progressed.  It is described as 5.5 x 6.0 with 3.0 x 4.0 x 2.0 full depth center with soft necrotic tissue.  Contamination prevention necessitates maintenance Foley placement.

## 2024-08-05 NOTE — Assessment & Plan Note (Signed)
 In the context of dementia; she denies any GI symptoms.

## 2024-08-09 DIAGNOSIS — K5641 Fecal impaction: Secondary | ICD-10-CM

## 2024-08-21 ENCOUNTER — Encounter: Payer: Self-pay | Admitting: Pharmacist

## 2024-08-21 NOTE — Progress Notes (Signed)
 Patient ID: ALOHA Fernandez, female   DOB: 06-09-1936, 88 y.o.   MRN: [id number]  Pharmacy Quality Measure Review  This patient is appearing on a report for being at risk of failing the adherence measure for cholesterol (statin) medications this calendar year.   Medication: Rosuvastatin  20 mg  Last fill date: 10/25 for 28 day supply  Patient is in a skilled nursing facility and she gets her medications filled through [facility].  They fill Patient's medications in 7 day cycle packs and complete monthly composite billing.  [name], PharmD, BCACP Clinical Pharmacist [phone]

## 2024-08-29 ENCOUNTER — Other Ambulatory Visit (HOSPITAL_COMMUNITY)
Admission: RE | Admit: 2024-08-29 | Discharge: 2024-08-29 | Disposition: A | Discharge status: Home / Self Care | Source: Skilled Nursing Facility | Attending: Adult Health | Admitting: Adult Health

## 2024-08-29 DIAGNOSIS — E113293 Type 2 diabetes mellitus with mild nonproliferative diabetic retinopathy without macular edema, bilateral: Secondary | ICD-10-CM | POA: Insufficient documentation

## 2024-08-29 LAB — BASIC METABOLIC PANEL WITH GFR
Anion gap: 9 (ref 5–15)
BUN: 49 mg/dL — ABNORMAL HIGH (ref 8–23)
CO2: 25 mmol/L (ref 22–32)
Calcium: 8.7 mg/dL — ABNORMAL LOW (ref 8.9–10.3)
Chloride: 100 mmol/L (ref 98–111)
Creatinine, Ser: 1.57 mg/dL — ABNORMAL HIGH (ref 0.44–1.00)
GFR, Estimated: 32 mL/min — ABNORMAL LOW
Glucose, Bld: 148 mg/dL — ABNORMAL HIGH (ref 70–99)
Potassium: 5.4 mmol/L — ABNORMAL HIGH (ref 3.5–5.1)
Sodium: 134 mmol/L — ABNORMAL LOW (ref 135–145)

## 2024-09-05 ENCOUNTER — Ambulatory Visit: Payer: Self-pay

## 2024-09-08 ENCOUNTER — Other Ambulatory Visit: Admitting: Urology

## 2024-09-08 ENCOUNTER — Non-Acute Institutional Stay (SKILLED_NURSING_FACILITY): Payer: Self-pay | Admitting: Internal Medicine

## 2024-09-08 DIAGNOSIS — R627 Adult failure to thrive: Secondary | ICD-10-CM

## 2024-09-08 DIAGNOSIS — R310 Gross hematuria: Secondary | ICD-10-CM

## 2024-09-08 DIAGNOSIS — N184 Chronic kidney disease, stage 4 (severe): Secondary | ICD-10-CM | POA: Diagnosis not present

## 2024-09-08 NOTE — Progress Notes (Unsigned)
 "   NURSING HOME LOCATION:  [facility] ROOM NUMBER:  [id number]  CODE STATUS:  DNR  PCP: [name], NP   This is a nursing facility follow up visit for specific acute issue of genitourinary bleeding..  Interim medical record and care since last SNF visit was updated with review of diagnostic studies and change in clinical status since last visit were documented.  HPI: Staff reports that she has had blood in her pamper as well as blood clots and blood in the urinary catheter as well as around the catheter.   There have been no other bleeding dyscrasias reported. Urine cytology had been completed 07/31/2024 and revealed inflammatory change but no definite malignant change.  She has had decreased p.o. fluid intake with exception of drinking 3 cans or more of Ensure a day.  She is been otherwise essentially n.p.o.  She has lost from a weight of 158.7 on 08/07/2024 down to 149.1 pounds on 09/07/2024. Glucose control has been fair with a blood sugar average of 237 and range of 106 up to 343. She also has a sacral wound which has progressed. She is completing Tamiflu for prophylaxis as there have been in room for Wednesday outbreak in the facility.   Review of systems: Dementia invalidated responses.  She validates that been eating kind of poor do not have much appetite.  She denies any bleeding dyscrasias.  She does have numbness and tingling in her fingers.  She denies any GU symptoms.  Most recent labs were performed 08/29/2024 and revealed mild hyponatremia with a value of 134 and minimal hyperkalemia at 5.4.  BUN was 49; actually this had improved from a prior value of 56.  CKD and also improved from prior creatinine 1.72 down to 1.57.  There have been minimal improvement in GFR rising from 29-32.  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 Most recent H/H was 7.6/23.9.  Over the last year the peak H/H has been 9/27.6 and the nadir 6.7/21.4.  Constitutional: No fever, significant  weight change, fatigue  Eyes: No redness, discharge, pain, vision change ENT/mouth: No nasal congestion,  purulent discharge, earache, change in hearing, sore throat  Cardiovascular: No chest pain, palpitations, paroxysmal nocturnal dyspnea, claudication, edema  Respiratory: No cough, sputum production, hemoptysis, DOE, significant snoring, apnea   Gastrointestinal: No heartburn, dysphagia, abdominal pain, nausea /vomiting, rectal bleeding, melena, change in bowels Genitourinary: No dysuria, hematuria, pyuria, incontinence, nocturia Musculoskeletal: No joint stiffness, joint swelling, weakness, pain Dermatologic: No rash, pruritus, change in appearance of skin Neurologic: No dizziness, headache, syncope, seizures, numbness, tingling Psychiatric: No significant anxiety, depression, insomnia, anorexia Endocrine: No change in hair/skin/nails, excessive thirst, excessive hunger, excessive urination  Hematologic/lymphatic: No significant bruising, lymphadenopathy, abnormal bleeding Allergy/immunology: No itchy/watery eyes, significant sneezing, urticaria, angioedema  Physical exam:  Pertinent or positive findings: She appears her age and chronically ill.  Eyes are somewhat sunken.  Her expression is tends to be somewhat wide-eyed.  She is wearing only the upper plate.  The tongue is somewhat dry.  Breath sounds are decreased.  Bowel sounds are also decreased.  Foley is in place.  There is some blood in the catheter.  Pedal pulses are decreased.  She has limb atrophy and interosseous wasting.  There is no opposition of the lower extremities. General appearance: Adequately nourished; no acute distress, increased work of breathing is present.   Lymphatic: No lymphadenopathy about the head, neck, axilla. Eyes: No conjunctival inflammation or lid edema is present. There  is no scleral icterus. Ears:  External ear exam shows no significant lesions or deformities.   Nose:  External nasal examination shows  no deformity or inflammation. Nasal mucosa are pink and moist without lesions, exudates Oral exam:  Lips and gums are healthy appearing. There is no oropharyngeal erythema or exudate. Neck:  No thyromegaly, masses, tenderness noted.    Heart:  Normal rate and regular rhythm. S1 and S2 normal without gallop, murmur, click, rub .  Lungs: Chest clear to auscultation without wheezes, rhonchi, rales, rubs. Abdomen: Bowel sounds are normal. Abdomen is soft and nontender with no organomegaly, hernias, masses. GU: Deferred  Extremities:  No cyanosis, clubbing, edema  Neurologic exam : Cn 2-7 intact Strength equal  in upper & lower extremities Balance, Rhomberg, finger to nose testing could not be completed due to clinical state Deep tendon reflexes are equal Skin: Warm & dry w/o tenting. No significant lesions or rash.  See summary under each active problem in the Problem List with associated updated therapeutic plan  "

## 2024-09-08 NOTE — Assessment & Plan Note (Addendum)
 She describes significant anorexia.  Other than consuming 3 or more cans of Ensure a day left; nutritional intake is essentially nil.  This is an associated with almost a 10 pound weight loss over the last month.  Encouraged her to notify us  & daughters as to any foods she desires ,without any restrictions in reference to sugar or salt content. Hospice involvement appropriate.

## 2024-09-08 NOTE — Assessment & Plan Note (Signed)
 Serially there is been minimal improvement in her CKD; GFR is now 32, low grade stage IIIb.  Med list reviewed; no indication change in meds or dosages.

## 2024-09-08 NOTE — Assessment & Plan Note (Signed)
 Staff reports passing clots as well as frank hematuria.  The patient has no GU symptoms.  The most likely etiology is bladder irritation from chronic indwelling catheter.  Med list reviewed; she is not on any anticoagulants.  She has severe anemia which is relatively stable.  There is no thrombocytopenia.  She appears preterminal and aggressive intervention to include cystoscopy will not be pursued.

## 2024-09-09 ENCOUNTER — Encounter: Payer: Self-pay | Admitting: Internal Medicine

## 2024-09-22 ENCOUNTER — Encounter: Payer: Self-pay | Admitting: Adult Health

## 2024-09-22 NOTE — Progress Notes (Signed)
 This encounter was created in error - please disregard.

## 2024-10-02 ENCOUNTER — Ambulatory Visit: Admitting: Urology

## 2025-05-14 ENCOUNTER — Ambulatory Visit: Admitting: Adult Health
